# Patient Record
Sex: Male | Born: 1945 | Race: White | Hispanic: No | Marital: Married | Smoking: Former smoker
Health system: Southern US, Community
[De-identification: ages and names within clinical notes are randomized; demographics above are authoritative.]

## PROBLEM LIST (undated history)

## (undated) DIAGNOSIS — E785 Hyperlipidemia, unspecified: Secondary | ICD-10-CM

## (undated) DIAGNOSIS — G4730 Sleep apnea, unspecified: Secondary | ICD-10-CM

## (undated) DIAGNOSIS — A4902 Methicillin resistant Staphylococcus aureus infection, unspecified site: Secondary | ICD-10-CM

## (undated) DIAGNOSIS — F329 Major depressive disorder, single episode, unspecified: Secondary | ICD-10-CM

## (undated) DIAGNOSIS — F419 Anxiety disorder, unspecified: Secondary | ICD-10-CM

## (undated) DIAGNOSIS — I219 Acute myocardial infarction, unspecified: Secondary | ICD-10-CM

## (undated) DIAGNOSIS — E119 Type 2 diabetes mellitus without complications: Secondary | ICD-10-CM

## (undated) DIAGNOSIS — I10 Essential (primary) hypertension: Secondary | ICD-10-CM

## (undated) DIAGNOSIS — F32A Depression, unspecified: Secondary | ICD-10-CM

## (undated) DIAGNOSIS — K219 Gastro-esophageal reflux disease without esophagitis: Secondary | ICD-10-CM

## (undated) DIAGNOSIS — C689 Malignant neoplasm of urinary organ, unspecified: Secondary | ICD-10-CM

## (undated) HISTORY — PX: CORONARY ANGIOPLASTY WITH STENT PLACEMENT: SHX49

## (undated) HISTORY — DX: Hyperlipidemia, unspecified: E78.5

## (undated) HISTORY — DX: Depression, unspecified: F32.A

## (undated) HISTORY — DX: Essential (primary) hypertension: I10

## (undated) HISTORY — DX: Anxiety disorder, unspecified: F41.9

## (undated) HISTORY — PX: NASAL SINUS SURGERY: SHX719

## (undated) HISTORY — DX: Major depressive disorder, single episode, unspecified: F32.9

## (undated) HISTORY — PX: CATARACT EXTRACTION: SUR2

## (undated) HISTORY — DX: Sleep apnea, unspecified: G47.30

## (undated) HISTORY — PX: OTHER SURGICAL HISTORY: SHX169

## (undated) HISTORY — DX: Gastro-esophageal reflux disease without esophagitis: K21.9

## (undated) HISTORY — PX: EYE SURGERY: SHX253

## (undated) HISTORY — DX: Acute myocardial infarction, unspecified: I21.9

## (undated) HISTORY — DX: Type 2 diabetes mellitus without complications: E11.9

---
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
## 1990-07-10 DIAGNOSIS — A4902 Methicillin resistant Staphylococcus aureus infection, unspecified site: Secondary | ICD-10-CM

## 1990-07-10 HISTORY — DX: Methicillin resistant Staphylococcus aureus infection, unspecified site: A49.02

## 1993-07-10 DIAGNOSIS — I219 Acute myocardial infarction, unspecified: Secondary | ICD-10-CM

## 1993-07-10 HISTORY — DX: Acute myocardial infarction, unspecified: I21.9

## 2004-08-15 ENCOUNTER — Other Ambulatory Visit: Payer: Self-pay

## 2004-08-15 ENCOUNTER — Inpatient Hospital Stay: Payer: Self-pay

## 2004-08-16 ENCOUNTER — Other Ambulatory Visit: Payer: Self-pay

## 2006-11-06 ENCOUNTER — Emergency Department: Payer: Self-pay | Admitting: Emergency Medicine

## 2006-11-26 ENCOUNTER — Other Ambulatory Visit: Payer: Self-pay

## 2006-11-26 ENCOUNTER — Inpatient Hospital Stay: Payer: Self-pay | Admitting: Internal Medicine

## 2008-02-05 LAB — HM COLONOSCOPY

## 2008-03-25 ENCOUNTER — Other Ambulatory Visit: Payer: Self-pay

## 2008-03-25 ENCOUNTER — Observation Stay: Payer: Self-pay | Admitting: Internal Medicine

## 2008-07-10 HISTORY — PX: COLONOSCOPY: SHX174

## 2009-01-21 ENCOUNTER — Ambulatory Visit: Payer: Self-pay | Admitting: Family Medicine

## 2011-03-07 ENCOUNTER — Ambulatory Visit: Payer: Self-pay | Admitting: Ophthalmology

## 2011-03-20 ENCOUNTER — Ambulatory Visit: Payer: Self-pay | Admitting: Ophthalmology

## 2011-09-06 ENCOUNTER — Ambulatory Visit: Payer: Self-pay | Admitting: Family Medicine

## 2012-04-23 DIAGNOSIS — L0292 Furuncle, unspecified: Secondary | ICD-10-CM | POA: Insufficient documentation

## 2012-09-24 DIAGNOSIS — M5136 Other intervertebral disc degeneration, lumbar region: Secondary | ICD-10-CM | POA: Insufficient documentation

## 2012-09-24 DIAGNOSIS — M51369 Other intervertebral disc degeneration, lumbar region without mention of lumbar back pain or lower extremity pain: Secondary | ICD-10-CM | POA: Insufficient documentation

## 2012-10-02 DIAGNOSIS — I2510 Atherosclerotic heart disease of native coronary artery without angina pectoris: Secondary | ICD-10-CM | POA: Insufficient documentation

## 2013-01-09 ENCOUNTER — Encounter: Payer: Self-pay | Admitting: Internal Medicine

## 2013-02-03 ENCOUNTER — Emergency Department: Payer: Self-pay | Admitting: Emergency Medicine

## 2013-02-03 LAB — CBC
HCT: 40.1 % (ref 40.0–52.0)
MCH: 30.9 pg (ref 26.0–34.0)
MCHC: 34.7 g/dL (ref 32.0–36.0)
MCV: 89 fL (ref 80–100)
RBC: 4.5 10*6/uL (ref 4.40–5.90)
RDW: 13.8 % (ref 11.5–14.5)

## 2013-02-03 LAB — COMPREHENSIVE METABOLIC PANEL
Albumin: 3.3 g/dL — ABNORMAL LOW (ref 3.4–5.0)
Alkaline Phosphatase: 121 U/L (ref 50–136)
BUN: 11 mg/dL (ref 7–18)
Chloride: 106 mmol/L (ref 98–107)
Co2: 28 mmol/L (ref 21–32)
Creatinine: 0.69 mg/dL (ref 0.60–1.30)
EGFR (Non-African Amer.): >60
Osmolality: 277 (ref 275–301)
SGOT(AST): 26 U/L (ref 15–37)
SGPT (ALT): 30 U/L (ref 12–78)

## 2013-02-03 LAB — URINALYSIS, COMPLETE
Bacteria: NONE SEEN
Blood: NEGATIVE
Glucose,UR: NEGATIVE mg/dL (ref 0–75)
Ketone: NEGATIVE
Nitrite: NEGATIVE
Protein: NEGATIVE
Squamous Epithelial: <1
WBC UR: NONE SEEN /HPF (ref 0–5)

## 2013-02-07 ENCOUNTER — Encounter: Payer: Self-pay | Admitting: Internal Medicine

## 2013-02-24 ENCOUNTER — Ambulatory Visit: Payer: Self-pay | Admitting: Anesthesiology

## 2013-03-10 ENCOUNTER — Encounter: Payer: Self-pay | Admitting: Internal Medicine

## 2013-05-06 ENCOUNTER — Ambulatory Visit: Payer: Self-pay | Admitting: Anesthesiology

## 2013-05-29 ENCOUNTER — Ambulatory Visit: Payer: Self-pay | Admitting: Anesthesiology

## 2013-09-05 ENCOUNTER — Emergency Department: Payer: Self-pay | Admitting: Emergency Medicine

## 2013-09-05 DIAGNOSIS — IMO0002 Reserved for concepts with insufficient information to code with codable children: Secondary | ICD-10-CM | POA: Diagnosis not present

## 2013-09-30 LAB — LIPID PANEL
CHOLESTEROL: 147 mg/dL (ref 0–200)
HDL: 56 mg/dL (ref 35–70)
LDL Cholesterol: 74 mg/dL
LDl/HDL Ratio: 1.3
Triglycerides: 86 mg/dL (ref 40–160)

## 2013-09-30 LAB — HEPATIC FUNCTION PANEL
ALT: 17 U/L (ref 10–40)
AST: 20 U/L (ref 14–40)
Alkaline Phosphatase: 109 U/L (ref 25–125)
BILIRUBIN, TOTAL: 1.8 mg/dL

## 2013-09-30 LAB — CBC AND DIFFERENTIAL
HCT: 45 % (ref 41–53)
Hemoglobin: 15.3 g/dL (ref 13.5–17.5)
NEUTROS ABS: 5 /uL
PLATELETS: 211 10*3/uL (ref 150–399)
WBC: 8.1 10^3/mL

## 2013-09-30 LAB — TSH: TSH: 1.83 u[IU]/mL (ref 0.41–5.90)

## 2013-09-30 LAB — BASIC METABOLIC PANEL
BUN: 14 mg/dL (ref 4–21)
CREATININE: 0.9 mg/dL (ref 0.6–1.3)
Glucose: 127 mg/dL
Potassium: 4.4 mmol/L (ref 3.4–5.3)
SODIUM: 142 mmol/L (ref 137–147)

## 2013-09-30 LAB — PSA: PSA: 0.5

## 2014-04-13 ENCOUNTER — Ambulatory Visit: Payer: Self-pay | Admitting: Family Medicine

## 2014-04-13 DIAGNOSIS — S82402A Unspecified fracture of shaft of left fibula, initial encounter for closed fracture: Secondary | ICD-10-CM | POA: Diagnosis not present

## 2014-09-11 ENCOUNTER — Emergency Department: Payer: Self-pay | Admitting: Emergency Medicine

## 2014-09-11 DIAGNOSIS — S0990XA Unspecified injury of head, initial encounter: Secondary | ICD-10-CM | POA: Diagnosis not present

## 2014-09-11 DIAGNOSIS — R42 Dizziness and giddiness: Secondary | ICD-10-CM | POA: Diagnosis not present

## 2014-09-23 LAB — HEMOGLOBIN A1C: HEMOGLOBIN A1C: 7.7 % — AB (ref 4.0–6.0)

## 2014-10-30 NOTE — H&P (Signed)
PATIENT NAME:  Patrick Mcgee, Patrick Mcgee MR#:  [id number] DATE OF BIRTH:  1945/08/22  DATE OF ADMISSION:  02/24/2013  CHIEF COMPLAINT:  Low back pain with right and left posterior lateral leg pain.  PROCEDURE:  None.  HISTORY OF PRESENT ILLNESS:  [place] is a pleasant 69 year old white male with long-standing history of low back pain for over 20 years.  In the past, he has been seen at [facility] at the pain center there were he has had previous epidural steroid injections giving him significant relief in his low back pain. He has had gradual increase in low back pain over the past several months with the right side generally causing more pain than the left with calf pain. His primary complaint is low back pain with posterior lateral calf pain and occasional give-way weakness. The pain is described as sharp, stabbing, throbbing pain with a maximum VAS of about 4. The pain is worse with activity, aggravated by bending and twisting motions, occasionally alleviated by hot packs and lying down. As mentioned, previous epidural steroids did help him. He has had a previous MRI, which is unavailable to me at this time.  PAST MEDICAL HISTORY:  Significant for high blood pressure, previous heart attack and heart surgery. Currently on blood thinners such as Plavix. He also has a history of sleep apnea on CPAP with history of diabetes.  Negative review of systems for other neurologic problems, psychologic problems GI or GU problems or hematologic problems.   SOCIAL HISTORY: Positive for being married, one child, never smoked, works full-time.  PAST SURGICAL HISTORY:  Include previous heart surgery, stent x 5.   FAMILY HISTORY:  Positive for diabetes and high blood pressure.  CURRENT MEDICATIONS: Include Plavix 75 mg, atorvastatin, losartan, Januvia, NovoLog 70/30 mix 30 units, niacin, carvedilol, levothyroxine, aspirin, fluocinonide,   vitamin B12 and Symbicort.  ALLERGIES:  PENICILLIN AND SULFA.  PHYSICAL  EXAMINATION: VITAL SIGNS:  Reveals a VAS of 3/10, temperature 96.7, blood pressure 140/86, pulse 65, respirations 18. HEART:  Regular rate and rhythm without murmur. LUNGS:  Clear to auscultation bilaterally. LOW BACK:  Inspection reveals some paraspinous muscle tenderness, but no overt trigger points.  He has good strength throughout the lower extremities, questionable straight leg sign at 45 degrees on the right, negative on the left.  Good muscle tone and bulk.  ASSESSMENT: 1.  Degenerative disk disease with radicular symptoms, bilateral L5 distribution, L5 or S1 distribution, right greater than left. 2.  Facet arthropathy. 3.  Paraspinous muscle tenderness. 4.  History of coronary disease, status post myocardial infarction and stent placement on Plavix.  PLAN: 1.  We are going to get clearance to discontinue his Plavix before an epidural series is initiated. 2. MRI report is being requested from Dr. Harlow Asa at [facility]. He is to return to clinic in approximately 2 weeks after we can confirm that we can discontinue his Plavix for this procedure.   ____________________________ Alvina Filbert. Andree Elk, MD jga:ce D: 03/06/2013 10:12:41 ET T: 03/06/2013 10:26:15 ET JOB#: [id number]  cc: Alvina Filbert. Andree Elk, MD, <Dictator> Richard L. Rosanna Randy, MD  Alvina Filbert Jesper Stirewalt MD ELECTRONICALLY SIGNED 03/12/2013 7:44

## 2014-11-12 ENCOUNTER — Encounter: Payer: Self-pay | Admitting: Emergency Medicine

## 2014-11-12 DIAGNOSIS — E785 Hyperlipidemia, unspecified: Secondary | ICD-10-CM | POA: Insufficient documentation

## 2014-11-12 DIAGNOSIS — E039 Hypothyroidism, unspecified: Secondary | ICD-10-CM | POA: Insufficient documentation

## 2014-11-12 DIAGNOSIS — K130 Diseases of lips: Secondary | ICD-10-CM | POA: Insufficient documentation

## 2014-11-12 DIAGNOSIS — E669 Obesity, unspecified: Secondary | ICD-10-CM | POA: Insufficient documentation

## 2014-11-12 DIAGNOSIS — G4730 Sleep apnea, unspecified: Secondary | ICD-10-CM | POA: Insufficient documentation

## 2014-11-12 DIAGNOSIS — K219 Gastro-esophageal reflux disease without esophagitis: Secondary | ICD-10-CM | POA: Insufficient documentation

## 2014-11-12 DIAGNOSIS — M9979 Connective tissue and disc stenosis of intervertebral foramina of abdomen and other regions: Secondary | ICD-10-CM | POA: Insufficient documentation

## 2014-11-12 DIAGNOSIS — E119 Type 2 diabetes mellitus without complications: Secondary | ICD-10-CM | POA: Insufficient documentation

## 2014-11-12 DIAGNOSIS — I2510 Atherosclerotic heart disease of native coronary artery without angina pectoris: Secondary | ICD-10-CM | POA: Insufficient documentation

## 2014-11-12 DIAGNOSIS — J309 Allergic rhinitis, unspecified: Secondary | ICD-10-CM | POA: Insufficient documentation

## 2014-11-12 DIAGNOSIS — I10 Essential (primary) hypertension: Secondary | ICD-10-CM | POA: Insufficient documentation

## 2014-11-12 DIAGNOSIS — J45909 Unspecified asthma, uncomplicated: Secondary | ICD-10-CM | POA: Insufficient documentation

## 2014-11-12 DIAGNOSIS — J342 Deviated nasal septum: Secondary | ICD-10-CM | POA: Insufficient documentation

## 2014-12-22 ENCOUNTER — Ambulatory Visit (INDEPENDENT_AMBULATORY_CARE_PROVIDER_SITE_OTHER): Payer: 59 | Admitting: Family Medicine

## 2014-12-22 ENCOUNTER — Encounter: Payer: Self-pay | Admitting: Family Medicine

## 2014-12-22 VITALS — BP 138/78 | HR 76 | Temp 97.8°F | Resp 16 | Ht 68.0 in | Wt 220.0 lb

## 2014-12-22 DIAGNOSIS — I10 Essential (primary) hypertension: Secondary | ICD-10-CM | POA: Diagnosis not present

## 2014-12-22 DIAGNOSIS — E118 Type 2 diabetes mellitus with unspecified complications: Secondary | ICD-10-CM

## 2014-12-22 DIAGNOSIS — E785 Hyperlipidemia, unspecified: Secondary | ICD-10-CM | POA: Diagnosis not present

## 2014-12-22 NOTE — Progress Notes (Signed)
Patient ID: Patrick Mcgee, male   DOB: [birth year], 69 y.o.   MRN: [id number]   Patrick Mcgee  MRN: [id number] DOB: [birth year]  Subjective:  HPI   1. Type 2 diabetes mellitus with complication  - HgB K4Y  2. Essential hypertension  - CBC With Differential/Platelet - CMP14+EGFR - TSH  3. Hyperlipidemia  -4. Obesity Patient losing weight with Nutrisystem Lipid Panel With LDL/HDL Ratio   Patient Active Problem List   Diagnosis Date Noted  . Allergic rhinitis 11/12/2014  . Airway hyperreactivity 11/12/2014  . Atherosclerosis of coronary artery 11/12/2014  . Cheilitis 11/12/2014  . Narrowing of intervertebral disc space 11/12/2014  . Deflected nasal septum 11/12/2014  . Essential (primary) hypertension 11/12/2014  . Esophageal reflux 11/12/2014  . HLD (hyperlipidemia) 11/12/2014  . BP (high blood pressure) 11/12/2014  . Adult hypothyroidism 11/12/2014  . Adiposity 11/12/2014  . Apnea, sleep 11/12/2014  . Diabetes mellitus, type 2 11/12/2014    History reviewed. No pertinent past medical history.  History   Social History  . Marital Status: Married    Spouse Name: N/A  . Number of Children: N/A  . Years of Education: N/A   Occupational History  . Not on file.   Social History Main Topics  . Smoking status: Former Smoker    Types: Cigars  . Smokeless tobacco: Not on file  . Alcohol Use: 0.6 oz/week    1 Standard drinks or equivalent per week  . Drug Use: No  . Sexual Activity: Not on file   Other Topics Concern  . Not on file   Social History Narrative    Outpatient Prescriptions Prior to Visit  Medication Sig Dispense Refill  . aspirin 81 MG tablet Take by mouth.    Marland Kitchen atorvastatin (LIPITOR) 40 MG tablet Take by mouth.    . budesonide-formoterol (SYMBICORT) 80-4.5 MCG/ACT inhaler Inhale into the lungs.    . canagliflozin (INVOKANA) 300 MG TABS tablet Take by mouth.    . carvedilol (COREG) 12.5 MG tablet Take by mouth.    . clopidogrel (PLAVIX) 75  MG tablet Take by mouth.    . Glucose Blood DISK BAYER BREEZE 2 TEST (In Vitro Disk)  1 Disk check sugar twice daily for 0 days  Quantity: 3;  Refills: 3   Ordered :19-May-2014  [name] MD;  Started 19-May-2014 Active Comments: DX: E11.9-QS 90 day supply-    . insulin aspart protamine - aspart (NOVOLOG 70/30 MIX) (70-30) 100 UNIT/ML FlexPen Inject into the skin.    Marland Kitchen levothyroxine (SYNTHROID, LEVOTHROID) 75 MCG tablet Take by mouth.    . losartan (COZAAR) 100 MG tablet Take by mouth.    . meclizine (ANTIVERT) 25 MG tablet Take by mouth.    . niacin (NIASPAN) 500 MG CR tablet Take by mouth.    . nystatin cream (MYCOSTATIN) NYSTATIN, 100000 UNIT/GM (External Cream) - Historical Medication  apply to affected area daily, as needed (100000 UNIT/GM) Active Comments: Medication taken as needed.    . sitaGLIPtin (JANUVIA) 100 MG tablet Take by mouth.    . budesonide-formoterol (SYMBICORT) 80-4.5 MCG/ACT inhaler Inhale into the lungs.    . clopidogrel (PLAVIX) 75 MG tablet Take by mouth.    . fluocinonide cream (LIDEX) 0.05 % FLUOCINONIDE, 0.05% (External Cream) - Historical Medication  apply to affected area daily (0.05 %) Active Comments: Per Dr. Evorn Gong    . levothyroxine (SYNTHROID, LEVOTHROID) 75 MCG tablet Take by mouth.    . mometasone (NASONEX) 50 MCG/ACT nasal spray Place  into the nose.     No facility-administered medications prior to visit.    Allergies  Allergen Reactions  . Ace Inhibitors     Other reaction(s): Unknown  . Sulfa Antibiotics Other (See Comments)  . Penicillins Rash    Review of Systems  Constitutional: Negative.   HENT: Negative.   Eyes: Negative.   Respiratory: Negative.   Cardiovascular: Negative.   Gastrointestinal: Negative.   Genitourinary: Negative.   Skin: Negative.   Neurological: Negative.   All other systems reviewed and are negative.  Objective:  BP 138/78 mmHg  Pulse 76  Temp(Src) 97.8 F (36.6 C) (Oral)  Resp 16  Ht $R'5\' 8"'Xv$  (1.727 m)   Wt 220 lb (99.791 kg)  BMI 33.46 kg/m2  Physical Exam  Constitutional: He is oriented to person, place, and time and well-developed, well-nourished, and in no distress.  HENT:  Head: Normocephalic and atraumatic.  Right Ear: External ear normal.  Left Ear: External ear normal.  Mouth/Throat: Oropharynx is clear and moist.  Eyes: Conjunctivae and EOM are normal. Pupils are equal, round, and reactive to light.  Neck: Normal range of motion. Neck supple.  Cardiovascular: Normal rate, regular rhythm and normal heart sounds.   Pulmonary/Chest: Effort normal and breath sounds normal.  Abdominal: Soft. Bowel sounds are normal.  Musculoskeletal: Normal range of motion.  Neurological: He is alert and oriented to person, place, and time. Gait normal.  Skin: Skin is warm and dry.  Psychiatric: Mood, memory, affect and judgment normal.    Assessment and Plan :  Type 2 diabetes mellitus with complication - Plan: HgB A1c  Essential hypertension - Plan: CBC With Differential/Platelet, CMP14+EGFR, TSH  Hyperlipidemia - Plan: Lipid Panel With LDL/HDL Ratio  Obesity. Patient now successfully losing weight with habit changes. 20 pounds lost [facility] 12/22/2014 4:30 PM

## 2015-01-28 ENCOUNTER — Ambulatory Visit (INDEPENDENT_AMBULATORY_CARE_PROVIDER_SITE_OTHER): Payer: Medicare Other

## 2015-01-28 ENCOUNTER — Ambulatory Visit (INDEPENDENT_AMBULATORY_CARE_PROVIDER_SITE_OTHER): Payer: Medicare Other | Admitting: Podiatry

## 2015-01-28 VITALS — BP 128/70 | HR 60 | Resp 16

## 2015-01-28 DIAGNOSIS — M722 Plantar fascial fibromatosis: Secondary | ICD-10-CM

## 2015-01-28 NOTE — Patient Instructions (Signed)
Plantar Fasciitis (Heel Spur Syndrome) with Rehab The plantar fascia is a fibrous, ligament-like, soft-tissue structure that spans the bottom of the foot. Plantar fasciitis is a condition that causes pain in the foot due to inflammation of the tissue. SYMPTOMS   Pain and tenderness on the underneath side of the foot.  Pain that worsens with standing or walking. CAUSES  Plantar fasciitis is caused by irritation and injury to the plantar fascia on the underneath side of the foot. Common mechanisms of injury include:  Direct trauma to bottom of the foot.  Damage to a small nerve that runs under the foot where the main fascia attaches to the heel bone.  Stress placed on the plantar fascia due to bone spurs. RISK INCREASES WITH:   Activities that place stress on the plantar fascia (running, jumping, pivoting, or cutting).  Poor strength and flexibility.  Improperly fitted shoes.  Tight calf muscles.  Flat feet.  Failure to warm-up properly before activity.  Obesity. PREVENTION  Warm up and stretch properly before activity.  Allow for adequate recovery between workouts.  Maintain physical fitness:  Strength, flexibility, and endurance.  Cardiovascular fitness.  Maintain a health body weight.  Avoid stress on the plantar fascia.  Wear properly fitted shoes, including arch supports for individuals who have flat feet.  PROGNOSIS  If treated properly, then the symptoms of plantar fasciitis usually resolve without surgery. However, occasionally surgery is necessary.  RELATED COMPLICATIONS   Recurrent symptoms that may result in a chronic condition.  Problems of the lower back that are caused by compensating for the injury, such as limping.  Pain or weakness of the foot during push-off following surgery.  Chronic inflammation, scarring, and partial or complete fascia tear, occurring more often from repeated injections.  TREATMENT  Treatment initially involves the  use of ice and medication to help reduce pain and inflammation. The use of strengthening and stretching exercises may help reduce pain with activity, especially stretches of the Achilles tendon. These exercises may be performed at home or with a therapist. Your caregiver may recommend that you use heel cups of arch supports to help reduce stress on the plantar fascia. Occasionally, corticosteroid injections are given to reduce inflammation. If symptoms persist for greater than 6 months despite non-surgical (conservative), then surgery may be recommended.   MEDICATION   If pain medication is necessary, then nonsteroidal anti-inflammatory medications, such as aspirin and ibuprofen, or other minor pain relievers, such as acetaminophen, are often recommended.  Do not take pain medication within 7 days before surgery.  Prescription pain relievers may be given if deemed necessary by your caregiver. Use only as directed and only as much as you need.  Corticosteroid injections may be given by your caregiver. These injections should be reserved for the most serious cases, because they may only be given a certain number of times.  HEAT AND COLD  Cold treatment (icing) relieves pain and reduces inflammation. Cold treatment should be applied for 10 to 15 minutes every 2 to 3 hours for inflammation and pain and immediately after any activity that aggravates your symptoms. Use ice packs or massage the area with a piece of ice (ice massage).  Heat treatment may be used prior to performing the stretching and strengthening activities prescribed by your caregiver, physical therapist, or athletic trainer. Use a heat pack or soak the injury in warm water.  SEEK IMMEDIATE MEDICAL CARE IF:  Treatment seems to offer no benefit, or the condition worsens.  Any medications   produce adverse side effects.  EXERCISES- RANGE OF MOTION (ROM) AND STRETCHING EXERCISES - Plantar Fasciitis (Heel Spur Syndrome) These exercises  may help you when beginning to rehabilitate your injury. Your symptoms may resolve with or without further involvement from your physician, physical therapist or athletic trainer. While completing these exercises, remember:   Restoring tissue flexibility helps normal motion to return to the joints. This allows healthier, less painful movement and activity.  An effective stretch should be held for at least 30 seconds.  A stretch should never be painful. You should only feel a gentle lengthening or release in the stretched tissue.  RANGE OF MOTION - Toe Extension, Flexion  Sit with your right / left leg crossed over your opposite knee.  Grasp your toes and gently pull them back toward the top of your foot. You should feel a stretch on the bottom of your toes and/or foot.  Hold this stretch for 10 seconds.  Now, gently pull your toes toward the bottom of your foot. You should feel a stretch on the top of your toes and or foot.  Hold this stretch for 10 seconds. Repeat  times. Complete this stretch 3 times per day.   RANGE OF MOTION - Ankle Dorsiflexion, Active Assisted  Remove shoes and sit on a chair that is preferably not on a carpeted surface.  Place right / left foot under knee. Extend your opposite leg for support.  Keeping your heel down, slide your right / left foot back toward the chair until you feel a stretch at your ankle or calf. If you do not feel a stretch, slide your bottom forward to the edge of the chair, while still keeping your heel down.  Hold this stretch for 10 seconds. Repeat 3 times. Complete this stretch 2 times per day.   STRETCH  Gastroc, Standing  Place hands on wall.  Extend right / left leg, keeping the front knee somewhat bent.  Slightly point your toes inward on your back foot.  Keeping your right / left heel on the floor and your knee straight, shift your weight toward the wall, not allowing your back to arch.  You should feel a gentle stretch  in the right / left calf. Hold this position for 10 seconds. Repeat 3 times. Complete this stretch 2 times per day.  STRETCH  Soleus, Standing  Place hands on wall.  Extend right / left leg, keeping the other knee somewhat bent.  Slightly point your toes inward on your back foot.  Keep your right / left heel on the floor, bend your back knee, and slightly shift your weight over the back leg so that you feel a gentle stretch deep in your back calf.  Hold this position for 10 seconds. Repeat 3 times. Complete this stretch 2 times per day.  STRETCH  Gastrocsoleus, Standing  Note: This exercise can place a lot of stress on your foot and ankle. Please complete this exercise only if specifically instructed by your caregiver.   Place the ball of your right / left foot on a step, keeping your other foot firmly on the same step.  Hold on to the wall or a rail for balance.  Slowly lift your other foot, allowing your body weight to press your heel down over the edge of the step.  You should feel a stretch in your right / left calf.  Hold this position for 10 seconds.  Repeat this exercise with a slight bend in your right /   left knee. Repeat 3 times. Complete this stretch 2 times per day.   STRENGTHENING EXERCISES - Plantar Fasciitis (Heel Spur Syndrome)  These exercises may help you when beginning to rehabilitate your injury. They may resolve your symptoms with or without further involvement from your physician, physical therapist or athletic trainer. While completing these exercises, remember:   Muscles can gain both the endurance and the strength needed for everyday activities through controlled exercises.  Complete these exercises as instructed by your physician, physical therapist or athletic trainer. Progress the resistance and repetitions only as guided.  STRENGTH - Towel Curls  Sit in a chair positioned on a non-carpeted surface.  Place your foot on a towel, keeping your heel  on the floor.  Pull the towel toward your heel by only curling your toes. Keep your heel on the floor. Repeat 3 times. Complete this exercise 2 times per day.  STRENGTH - Ankle Inversion  Secure one end of a rubber exercise band/tubing to a fixed object (table, pole). Loop the other end around your foot just before your toes.  Place your fists between your knees. This will focus your strengthening at your ankle.  Slowly, pull your big toe up and in, making sure the band/tubing is positioned to resist the entire motion.  Hold this position for 10 seconds.  Have your muscles resist the band/tubing as it slowly pulls your foot back to the starting position. Repeat 3 times. Complete this exercises 2 times per day.  Document Released: 06/26/2005 Document Revised: 09/18/2011 Document Reviewed: 10/08/2008 [facility] Patient Information 2014 [place], [place]. Diabetes and Foot Care Diabetes may cause you to have problems because of poor blood supply (circulation) to your feet and legs. This may cause the skin on your feet to become thinner, break easier, and heal more slowly. Your skin may become dry, and the skin may peel and crack. You may also have nerve damage in your legs and feet causing decreased feeling in them. You may not notice minor injuries to your feet that could lead to infections or more serious problems. Taking care of your feet is one of the most important things you can do for yourself.  HOME CARE INSTRUCTIONS  Wear shoes at all times, even in the house. Do not go barefoot. Bare feet are easily injured.  Check your feet daily for blisters, cuts, and redness. If you cannot see the bottom of your feet, use a mirror or ask someone for help.  Wash your feet with warm water (do not use hot water) and mild soap. Then pat your feet and the areas between your toes until they are completely dry. Do not soak your feet as this can dry your skin.  Apply a moisturizing lotion or petroleum  jelly (that does not contain alcohol and is unscented) to the skin on your feet and to dry, brittle toenails. Do not apply lotion between your toes.  Trim your toenails straight across. Do not dig under them or around the cuticle. File the edges of your nails with an emery board or nail file.  Do not cut corns or calluses or try to remove them with medicine.  Wear clean socks or stockings every day. Make sure they are not too tight. Do not wear knee-high stockings since they may decrease blood flow to your legs.  Wear shoes that fit properly and have enough cushioning. To break in new shoes, wear them for just a few hours a day. This prevents you from injuring your  feet. Always look in your shoes before you put them on to be sure there are no objects inside.  Do not cross your legs. This may decrease the blood flow to your feet.  If you find a minor scrape, cut, or break in the skin on your feet, keep it and the skin around it clean and dry. These areas may be cleansed with mild soap and water. Do not cleanse the area with peroxide, alcohol, or iodine.  When you remove an adhesive bandage, be sure not to damage the skin around it.  If you have a wound, look at it several times a day to make sure it is healing.  Do not use heating pads or hot water bottles. They may burn your skin. If you have lost feeling in your feet or legs, you may not know it is happening until it is too late.  Make sure your health care provider performs a complete foot exam at least annually or more often if you have foot problems. Report any cuts, sores, or bruises to your health care provider immediately. SEEK MEDICAL CARE IF:   You have an injury that is not healing.  You have cuts or breaks in the skin.  You have an ingrown nail.  You notice redness on your legs or feet.  You feel burning or tingling in your legs or feet.  You have pain or cramps in your legs and feet.  Your legs or feet are numb.  Your  feet always feel cold. SEEK IMMEDIATE MEDICAL CARE IF:   There is increasing redness, swelling, or pain in or around a wound.  There is a red line that goes up your leg.  Pus is coming from a wound.  You develop a fever or as directed by your health care provider.  You notice a bad smell coming from an ulcer or wound. Document Released: 06/23/2000 Document Revised: 02/26/2013 Document Reviewed: 12/03/2012 [facility] Patient Information 2015 [name], [place]. This information is not intended to replace advice given to you by your health care provider. Make sure you discuss any questions you have with your health care provider.

## 2015-01-29 ENCOUNTER — Encounter: Payer: Self-pay | Admitting: Podiatry

## 2015-01-29 NOTE — Progress Notes (Signed)
Subjective:     Patient ID: Patrick Mcgee, male   DOB: 1946-01-24, 69 y.o.   MRN: [id number]  HPI Patient presents to the office with complaints of right heel pain, which is worse in the morning and relieved by ambulation. Once he gets going the pain subsides. This has been ongoing for greater than 1 month. Denies any history of injury or trauma. He started walking more about 1 month ago which seemed to make it worse, although the pain was present prior to this. It does not wake him up at night. No previous treatment. No other complaints at this time.    Review of Systems  All other systems reviewed and are negative.      Objective:   Physical Exam AAO x3, NAD DP/PT pulses palpable bilaterally, CRT less than 3 seconds Protective sensation intact with Simms Weinstein monofilament, vibratory sensation intact, Achilles tendon reflex intact Tenderness to palpation overlying the plantar medial tubercle of the calcaneus to the right heel at the insertion of the plantar fascia. There is no pain along the course of plantar fascia within the arch of the foot. There is no pain with lateral compression of the calcaneus or pain the vibratory sensation. No pain on the posterior aspect of the calcaneus or along the course/insertion of the Achilles tendon. There is no overlying edema, erythema, increase in warmth. No other areas of tenderness palpation or pain with vibratory sensation to the foot/ankle. MMT 5/5, ROM WNL No open lesions or pre-ulcerative lesions are identified. No pain with calf compression, swelling, warmth, erythema.     Assessment:     Heel pain; plantar fasciitis     Plan:     -X-rays were obtained and reviewed with the patient.  -Treatment options discussed including all alternatives, risks, and complications. Discussed etiology -Discussed steroid injection; he wishes to hold off -Dispensed plantar fascial brace -Stretching exercises daily -Ice daily -Shoegear modifications;  not to go barefoot -Discussed orthotics  -Follow-up 3-4 weeks or sooner if any problems arise. In the meantime, encouraged to call the office with any questions, concerns, change in symptoms.    Celesta Gentile, DPM

## 2015-02-09 ENCOUNTER — Other Ambulatory Visit: Payer: Self-pay | Admitting: Emergency Medicine

## 2015-02-09 DIAGNOSIS — I25119 Atherosclerotic heart disease of native coronary artery with unspecified angina pectoris: Secondary | ICD-10-CM

## 2015-02-09 MED ORDER — CLOPIDOGREL BISULFATE 75 MG PO TABS: 75.0000 mg | ORAL_TABLET | Freq: Every day | ORAL | Status: DC

## 2015-02-09 NOTE — Progress Notes (Signed)
Per Rx request from web site. Pt requested refill on Clopidogrel. Dr, Rosanna Randy ok for 1 year.

## 2015-02-10 DIAGNOSIS — I2510 Atherosclerotic heart disease of native coronary artery without angina pectoris: Secondary | ICD-10-CM | POA: Diagnosis not present

## 2015-02-10 DIAGNOSIS — E119 Type 2 diabetes mellitus without complications: Secondary | ICD-10-CM | POA: Diagnosis not present

## 2015-02-10 DIAGNOSIS — I10 Essential (primary) hypertension: Secondary | ICD-10-CM | POA: Diagnosis not present

## 2015-02-19 ENCOUNTER — Other Ambulatory Visit: Payer: Self-pay | Admitting: Family Medicine

## 2015-02-19 NOTE — Telephone Encounter (Signed)
Pt would like all new printed RX expect for clopidogrel (PLAVIX) 75 MG tablet & niacin (NIASPAN) 500 MG CR tablet because he has new insurance. Thanks TNP

## 2015-02-22 MED ORDER — CARVEDILOL 12.5 MG PO TABS: 12.5000 mg | ORAL_TABLET | Freq: Two times a day (BID) | ORAL | Status: DC

## 2015-02-22 MED ORDER — SITAGLIPTIN PHOSPHATE 100 MG PO TABS: 100.0000 mg | ORAL_TABLET | Freq: Every day | ORAL | Status: DC

## 2015-02-22 MED ORDER — INSULIN ASPART PROT & ASPART (70-30 MIX) 100 UNIT/ML PEN: 20.0000 [IU] | PEN_INJECTOR | Freq: Two times a day (BID) | SUBCUTANEOUS | Status: DC

## 2015-02-22 MED ORDER — ATORVASTATIN CALCIUM 40 MG PO TABS: 40.0000 mg | ORAL_TABLET | Freq: Every day | ORAL | Status: DC

## 2015-02-22 MED ORDER — LEVOTHYROXINE SODIUM 75 MCG PO TABS: 75.0000 ug | ORAL_TABLET | Freq: Every day | ORAL | Status: DC

## 2015-02-22 MED ORDER — CANAGLIFLOZIN 300 MG PO TABS: 300.0000 mg | ORAL_TABLET | Freq: Every day | ORAL | Status: DC

## 2015-02-22 MED ORDER — BUDESONIDE-FORMOTEROL FUMARATE 80-4.5 MCG/ACT IN AERO: 2.0000 | INHALATION_SPRAY | Freq: Every day | RESPIRATORY_TRACT | Status: DC | PRN

## 2015-02-22 MED ORDER — LOSARTAN POTASSIUM 100 MG PO TABS: 100.0000 mg | ORAL_TABLET | Freq: Every day | ORAL | Status: DC

## 2015-02-22 NOTE — Telephone Encounter (Signed)
LMTCB-need to know if he needs 90 days supply or 30 day-aa

## 2015-02-22 NOTE — Telephone Encounter (Signed)
Pt states he needs a 90 day supply.  CB#[phone]/MW

## 2015-02-22 NOTE — Telephone Encounter (Signed)
Done, RXs placed up front, pt advised-aa

## 2015-02-25 ENCOUNTER — Ambulatory Visit: Payer: Medicare Other | Admitting: Podiatry

## 2015-03-02 DIAGNOSIS — L309 Dermatitis, unspecified: Secondary | ICD-10-CM | POA: Diagnosis not present

## 2015-03-02 DIAGNOSIS — L718 Other rosacea: Secondary | ICD-10-CM | POA: Diagnosis not present

## 2015-03-29 ENCOUNTER — Encounter: Payer: Self-pay | Admitting: Family Medicine

## 2015-03-29 ENCOUNTER — Ambulatory Visit (INDEPENDENT_AMBULATORY_CARE_PROVIDER_SITE_OTHER): Payer: Medicare Other | Admitting: Family Medicine

## 2015-03-29 VITALS — BP 110/62 | HR 80 | Temp 97.7°F | Resp 12 | Wt 203.0 lb

## 2015-03-29 DIAGNOSIS — I10 Essential (primary) hypertension: Secondary | ICD-10-CM

## 2015-03-29 DIAGNOSIS — E119 Type 2 diabetes mellitus without complications: Secondary | ICD-10-CM | POA: Diagnosis not present

## 2015-03-29 DIAGNOSIS — I25119 Atherosclerotic heart disease of native coronary artery with unspecified angina pectoris: Secondary | ICD-10-CM

## 2015-03-29 DIAGNOSIS — K219 Gastro-esophageal reflux disease without esophagitis: Secondary | ICD-10-CM | POA: Diagnosis not present

## 2015-03-29 DIAGNOSIS — E785 Hyperlipidemia, unspecified: Secondary | ICD-10-CM | POA: Diagnosis not present

## 2015-03-29 MED ORDER — PIOGLITAZONE HCL 15 MG PO TABS: 15.0000 mg | ORAL_TABLET | Freq: Every day | ORAL | Status: DC

## 2015-03-29 NOTE — Progress Notes (Signed)
Patient ID: Patrick Mcgee, male   DOB: May 28, 1946, 69 y.o.   MRN: [id number]    Subjective:  HPI   Diabetes Mellitus Type II, Follow-up:   Lab Results  Component Value Date   HGBA1C 7.7* 09/23/2014    Last seen for diabetes 3 months ago.  Management since then includes none. He reports good compliance with treatment. His diabeteic medications have become more expensive on Medicare and wants to discuss getting this situated maybe come off some medication due to his weight loss. He is not having side effects.   Episodes of hypoglycemia? no   Current Insulin Regimen: yes Novolog at times. He has lost weight so he does not use insulin all the time. Weight trend: stable   Pertinent Labs:    Component Value Date/Time   CHOL 147 09/30/2013   TRIG 86 09/30/2013   CREATININE 0.9 09/30/2013   CREATININE 0.69 02/03/2013 2223    Wt Readings from Last 3 Encounters:  03/29/15 203 lb (92.08 kg)  12/22/14 220 lb (99.791 kg)  11/12/14 238 lb (107.956 kg)    ------------------------------------------------------------------------    Hypertension, follow-up:  BP Readings from Last 3 Encounters:  03/29/15 110/62  01/28/15 128/70  12/22/14 138/78    He was last seen for hypertension 3 months ago.  BP at that visit was 138/78. Management since that visit includes none. He reports good compliance with treatment. He is not having side effects.    Weight trend: decreasing steadily Wt Readings from Last 3 Encounters:  03/29/15 203 lb (92.08 kg)  12/22/14 220 lb (99.791 kg)  11/12/14 238 lb (107.956 kg)      ------------------------------------------------------------------------     Prior to Admission medications   Medication Sig Start Date End Date Taking? Authorizing Provider  aspirin 81 MG tablet Take by mouth. 04/03/13  Yes Historical Provider, MD  atorvastatin (LIPITOR) 40 MG tablet Take 1 tablet (40 mg total) by mouth daily. 02/22/15  Yes Richard Maceo Pro.,  MD  budesonide-formoterol [facility]) 80-4.5 MCG/ACT inhaler Inhale 2 puffs into the lungs daily as needed. 02/22/15  Yes Richard Maceo Pro., MD  canagliflozin (INVOKANA) 300 MG TABS tablet Take 300 mg by mouth daily. 02/22/15  Yes Richard Maceo Pro., MD  carvedilol (COREG) 12.5 MG tablet Take 1 tablet (12.5 mg total) by mouth 2 (two) times daily. 02/22/15  Yes Richard Maceo Pro., MD  clopidogrel (PLAVIX) 75 MG tablet Take 1 tablet (75 mg total) by mouth daily. 02/09/15  Yes Richard Maceo Pro., MD  Glucose Blood DISK BAYER BREEZE 2 TEST (In Vitro Disk)  1 Disk check sugar twice daily for 0 days  Quantity: 3;  Refills: 3   Ordered :19-May-2014  [name] MD;  Started 19-May-2014 Active Comments: DX: E11.9-QS 90 day supply- 05/19/14  Yes Historical Provider, MD  insulin aspart protamine - aspart (NOVOLOG 70/30 MIX) (70-30) 100 UNIT/ML FlexPen Inject 0.2 mLs (20 Units total) into the skin 2 (two) times daily. 02/22/15  Yes Richard Maceo Pro., MD  levothyroxine (SYNTHROID, LEVOTHROID) 75 MCG tablet Take 1 tablet (75 mcg total) by mouth daily. 02/22/15  Yes Richard Maceo Pro., MD  losartan (COZAAR) 100 MG tablet Take 1 tablet (100 mg total) by mouth daily. 02/22/15  Yes Richard Maceo Pro., MD  meclizine (ANTIVERT) 25 MG tablet Take by mouth.   Yes Historical Provider, MD  niacin (NIASPAN) 500 MG CR tablet Take by mouth. 07/06/14  Yes Historical Provider, MD  nystatin cream (  MYCOSTATIN) NYSTATIN, 100000 UNIT/GM (External Cream) - Historical Medication  apply to affected area daily, as needed (100000 UNIT/GM) Active Comments: Medication taken as needed.   Yes Historical Provider, MD  nystatin-triamcinolone (MYCOLOG II) cream Apply topically See admin instructions. Use as directed 10/07/09  Yes Historical Provider, MD  sitaGLIPtin (JANUVIA) 100 MG tablet Take 1 tablet (100 mg total) by mouth daily. 02/22/15  Yes Richard Maceo Pro., MD  triamcinolone cream (KENALOG) 0.1 % See admin  instructions. Use as directed 12/03/14  Yes Historical Provider, MD    Patient Active Problem List   Diagnosis Date Noted  . Allergic rhinitis 11/12/2014  . Airway hyperreactivity 11/12/2014  . Atherosclerosis of coronary artery 11/12/2014  . Cheilitis 11/12/2014  . Narrowing of intervertebral disc space 11/12/2014  . Deflected nasal septum 11/12/2014  . Essential (primary) hypertension 11/12/2014  . Esophageal reflux 11/12/2014  . HLD (hyperlipidemia) 11/12/2014  . BP (high blood pressure) 11/12/2014  . Adult hypothyroidism 11/12/2014  . Adiposity 11/12/2014  . Apnea, sleep 11/12/2014  . Diabetes mellitus, type 2 11/12/2014  . Arteriosclerosis of coronary artery 10/02/2012    No past medical history on file.  Social History   Social History  . Marital Status: Married    Spouse Name: N/A  . Number of Children: N/A  . Years of Education: N/A   Occupational History  . Not on file.   Social History Main Topics  . Smoking status: Former Smoker    Types: Cigars  . Smokeless tobacco: Never Used  . Alcohol Use: 0.6 oz/week    1 Standard drinks or equivalent per week  . Drug Use: No  . Sexual Activity: Not on file   Other Topics Concern  . Not on file   Social History Narrative    Allergies  Allergen Reactions  . Ace Inhibitors     Other reaction(s): Unknown  . Sulfa Antibiotics Other (See Comments)  . Penicillins Rash    Review of Systems  Constitutional: Negative.   Respiratory: Negative.   Cardiovascular: Negative.   Gastrointestinal: Negative.   Musculoskeletal: Negative.   Neurological: Negative.   Endo/Heme/Allergies: Negative.   Psychiatric/Behavioral: Negative.      There is no immunization history on file for this patient. Objective:  BP 110/62 mmHg  Pulse 80  Temp(Src) 97.7 F (36.5 C)  Resp 12  Wt 203 lb (92.08 kg)  Physical Exam  Constitutional: He is oriented to person, place, and time and well-developed, well-nourished, and in no  distress.  HENT:  Head: Normocephalic and atraumatic.  Right Ear: External ear normal.  Left Ear: External ear normal.  Nose: Nose normal.  Eyes: Conjunctivae are normal.  Neck: Neck supple.  Cardiovascular: Normal rate, regular rhythm and normal heart sounds.   Pulmonary/Chest: Effort normal and breath sounds normal.  Abdominal: Soft.  Neurological: He is alert and oriented to person, place, and time.  Skin: Skin is warm and dry.  Psychiatric: Mood, memory, affect and judgment normal.    Lab Results  Component Value Date   WBC 8.1 09/30/2013   HGB 15.3 09/30/2013   HCT 45 09/30/2013   PLT 211 09/30/2013   GLUCOSE 158* 02/03/2013   CHOL 147 09/30/2013   TRIG 86 09/30/2013   HDL 56 09/30/2013   LDLCALC 74 09/30/2013   TSH 1.83 09/30/2013   PSA 0.5 09/30/2013   HGBA1C 7.7* 09/23/2014    CMP     Component Value Date/Time   NA 142 09/30/2013   NA 137 02/03/2013 2223  K 4.4 09/30/2013   K 4.0 02/03/2013 2223   CL 106 02/03/2013 2223   CO2 28 02/03/2013 2223   GLUCOSE 158* 02/03/2013 2223   BUN 14 09/30/2013   BUN 11 02/03/2013 2223   CREATININE 0.9 09/30/2013   CREATININE 0.69 02/03/2013 2223   CALCIUM 9.3 02/03/2013 2223   PROT 7.5 02/03/2013 2223   ALBUMIN 3.3* 02/03/2013 2223   AST 20 09/30/2013   AST 26 02/03/2013 2223   ALT 17 09/30/2013   ALT 30 02/03/2013 2223   ALKPHOS 109 09/30/2013   ALKPHOS 121 02/03/2013 2223   BILITOT 1.1* 02/03/2013 2223   GFRNONAA >60 02/03/2013 2223   GFRAA >60 02/03/2013 2223    Assessment and Plan :   1. Gastroesophageal reflux disease without esophagitis  - TSH  2. Hyperlipidemia  - Lipid Panel With LDL/HDL Ratio  3. Essential hypertension  - CBC with Differential/Platelet - Comprehensive metabolic panel - TSH  4. Diabetes type 2, controlled Due to cost patient would like to cut out some medications. He is taking insulin once in the past month and so we'll discontinue this. Discontinue Invokana , as it is a  $400 prescription. Try Actos 15 mg daily - pioglitazone (ACTOS) 15 MG tablet; Take 1 tablet (15 mg total) by mouth daily.  Dispense: 30 tablet; Refill: 12 - POCT A1C--7.7 today--RTC 3-4 months 5. Obesity Patient doing great with diet and exercise. He has lost about 35 pounds. I would like to see him lose another 15-20 pounds.  [name] MD [facility] 03/29/2015 4:35 PM

## 2015-03-31 DIAGNOSIS — K219 Gastro-esophageal reflux disease without esophagitis: Secondary | ICD-10-CM | POA: Diagnosis not present

## 2015-03-31 DIAGNOSIS — I10 Essential (primary) hypertension: Secondary | ICD-10-CM | POA: Diagnosis not present

## 2015-03-31 DIAGNOSIS — E785 Hyperlipidemia, unspecified: Secondary | ICD-10-CM | POA: Diagnosis not present

## 2015-04-01 LAB — COMPREHENSIVE METABOLIC PANEL
A/G RATIO: 1.8 (ref 1.1–2.5)
ALBUMIN: 4.2 g/dL (ref 3.5–4.8)
ALT: 12 IU/L (ref 0–44)
AST: 12 IU/L (ref 0–40)
Alkaline Phosphatase: 92 IU/L (ref 39–117)
BILIRUBIN TOTAL: 1.4 mg/dL — AB (ref 0.0–1.2)
BUN / CREAT RATIO: 25 — AB (ref 10–22)
BUN: 15 mg/dL (ref 8–27)
CALCIUM: 9.9 mg/dL (ref 8.6–10.2)
CHLORIDE: 102 mmol/L (ref 97–108)
CO2: 23 mmol/L (ref 18–29)
Creatinine, Ser: 0.6 mg/dL — ABNORMAL LOW (ref 0.76–1.27)
GFR, EST AFRICAN AMERICAN: 118 mL/min/{1.73_m2} (ref >59)
GFR, EST NON AFRICAN AMERICAN: 102 mL/min/{1.73_m2} (ref >59)
GLOBULIN, TOTAL: 2.3 g/dL (ref 1.5–4.5)
Glucose: 122 mg/dL — ABNORMAL HIGH (ref 65–99)
POTASSIUM: 4.4 mmol/L (ref 3.5–5.2)
Sodium: 140 mmol/L (ref 134–144)
TOTAL PROTEIN: 6.5 g/dL (ref 6.0–8.5)

## 2015-04-01 LAB — LIPID PANEL WITH LDL/HDL RATIO
Cholesterol, Total: 150 mg/dL (ref 100–199)
HDL: 43 mg/dL (ref >39)
LDL Calculated: 88 mg/dL (ref 0–99)
LDL/HDL RATIO: 2 ratio (ref 0.0–3.6)
Triglycerides: 93 mg/dL (ref 0–149)
VLDL CHOLESTEROL CAL: 19 mg/dL (ref 5–40)

## 2015-04-01 LAB — CBC WITH DIFFERENTIAL/PLATELET
BASOS: 1 %
Basophils Absolute: 0 10*3/uL (ref 0.0–0.2)
EOS (ABSOLUTE): 0.4 10*3/uL (ref 0.0–0.4)
EOS: 5 %
HEMATOCRIT: 43.5 % (ref 37.5–51.0)
HEMOGLOBIN: 15 g/dL (ref 12.6–17.7)
IMMATURE GRANS (ABS): 0 10*3/uL (ref 0.0–0.1)
IMMATURE GRANULOCYTES: 0 %
LYMPHS: 16 %
Lymphocytes Absolute: 1.2 10*3/uL (ref 0.7–3.1)
MCH: 30.7 pg (ref 26.6–33.0)
MCHC: 34.5 g/dL (ref 31.5–35.7)
MCV: 89 fL (ref 79–97)
MONOCYTES: 12 %
Monocytes Absolute: 0.9 10*3/uL (ref 0.1–0.9)
NEUTROS ABS: 5.1 10*3/uL (ref 1.4–7.0)
NEUTROS PCT: 66 %
PLATELETS: 213 10*3/uL (ref 150–379)
RBC: 4.89 x10E6/uL (ref 4.14–5.80)
RDW: 13.7 % (ref 12.3–15.4)
WBC: 7.5 10*3/uL (ref 3.4–10.8)

## 2015-04-01 LAB — TSH: TSH: 1.45 u[IU]/mL (ref 0.450–4.500)

## 2015-04-05 ENCOUNTER — Telehealth: Payer: Self-pay

## 2015-04-05 NOTE — Telephone Encounter (Signed)
10:19 am No answer  ED

## 2015-04-05 NOTE — Telephone Encounter (Signed)
-----   Message from Jerrol Banana., MD sent at 04/04/2015  8:56 AM EDT ----- Labs okay

## 2015-04-07 NOTE — Telephone Encounter (Signed)
Pt advised-aa 

## 2015-04-29 ENCOUNTER — Other Ambulatory Visit: Payer: Self-pay

## 2015-04-29 MED ORDER — LEVOTHYROXINE SODIUM 75 MCG PO TABS: 75.0000 ug | ORAL_TABLET | Freq: Every day | ORAL | Status: DC

## 2015-04-29 MED ORDER — LOSARTAN POTASSIUM 100 MG PO TABS: 100.0000 mg | ORAL_TABLET | Freq: Every day | ORAL | Status: DC

## 2015-04-29 MED ORDER — CARVEDILOL 12.5 MG PO TABS: 12.5000 mg | ORAL_TABLET | Freq: Two times a day (BID) | ORAL | Status: DC

## 2015-04-29 MED ORDER — ATORVASTATIN CALCIUM 40 MG PO TABS: 40.0000 mg | ORAL_TABLET | Freq: Every day | ORAL | Status: DC

## 2015-06-28 ENCOUNTER — Encounter: Payer: Self-pay | Admitting: Family Medicine

## 2015-06-28 ENCOUNTER — Ambulatory Visit (INDEPENDENT_AMBULATORY_CARE_PROVIDER_SITE_OTHER): Payer: Medicare Other | Admitting: Family Medicine

## 2015-06-28 VITALS — BP 144/86 | HR 64 | Temp 98.2°F | Resp 14 | Wt 206.0 lb

## 2015-06-28 DIAGNOSIS — E785 Hyperlipidemia, unspecified: Secondary | ICD-10-CM | POA: Diagnosis not present

## 2015-06-28 DIAGNOSIS — I10 Essential (primary) hypertension: Secondary | ICD-10-CM

## 2015-06-28 DIAGNOSIS — E119 Type 2 diabetes mellitus without complications: Secondary | ICD-10-CM

## 2015-06-28 DIAGNOSIS — K4090 Unilateral inguinal hernia, without obstruction or gangrene, not specified as recurrent: Secondary | ICD-10-CM

## 2015-06-28 DIAGNOSIS — E038 Other specified hypothyroidism: Secondary | ICD-10-CM | POA: Diagnosis not present

## 2015-06-28 DIAGNOSIS — E669 Obesity, unspecified: Secondary | ICD-10-CM

## 2015-06-28 DIAGNOSIS — Z1211 Encounter for screening for malignant neoplasm of colon: Secondary | ICD-10-CM | POA: Diagnosis not present

## 2015-06-28 DIAGNOSIS — I25119 Atherosclerotic heart disease of native coronary artery with unspecified angina pectoris: Secondary | ICD-10-CM

## 2015-06-28 LAB — POCT GLYCOSYLATED HEMOGLOBIN (HGB A1C): HEMOGLOBIN A1C: 7.3

## 2015-06-28 MED ORDER — PIOGLITAZONE HCL 30 MG PO TABS: 30.0000 mg | ORAL_TABLET | Freq: Every day | ORAL | Status: DC

## 2015-06-28 NOTE — Progress Notes (Signed)
Patient ID: Patrick Mcgee, male   DOB: Jul 20, 1945, 69 y.o.   MRN: [id number]    Subjective:  HPI  Patient is here for for Diabetes follow up. Patient was given RX for Actos on his last visit when he finishes with Invokana but patient is still taking Invokana and has not started Actos yet. His sugars for fasting around 120 or below and evening around 180 or 200. He had a reading of 270 recently, that was the highest. He takes Novolog but when needed used it about 4 times this month. He wants to dicuss stopping this or changing due to price. He has never tried any other insulin. He also wants to stop or change Januvia due to price.  He wants to discuss stopping Levothyroxine, he states this medication originally was started due to the nodules in his neck but since they are not there anymore he thought he could stop taking this maybe. Lab Results  Component Value Date   TSH 1.450 03/31/2015   He has had some pain and burning sensation with his right sided hernia again and wants to discuss referral now. He states this was mentioned with [place] before but has not seen a specialist for it.  He wanted to discuss getting colonoscopy set up-his last one was on 02/05/08 and it was recommended to repeat this in 5 years but this has not been repeated.   Prior to Admission medications   Medication Sig Start Date End Date Taking? Authorizing Provider  aspirin 81 MG tablet Take by mouth. 04/03/13  Yes Historical Provider, MD  atorvastatin (LIPITOR) 40 MG tablet Take 1 tablet (40 mg total) by mouth daily. 04/29/15  Yes Addisen Chappelle Maceo Pro., MD  budesonide-formoterol [facility]) 80-4.5 MCG/ACT inhaler Inhale 2 puffs into the lungs daily as needed. 02/22/15  Yes Yeimi Debnam Maceo Pro., MD  carvedilol (COREG) 12.5 MG tablet Take 1 tablet (12.5 mg total) by mouth 2 (two) times daily. 04/29/15  Yes Sandy Haye Maceo Pro., MD  Glucose Blood DISK BAYER BREEZE 2 TEST (In Vitro Disk)  1 Disk check sugar twice daily for 0  days  Quantity: 3;  Refills: 3   Ordered :19-May-2014  [name] MD;  Started 19-May-2014 Active Comments: DX: E11.9-QS 90 day supply- 05/19/14  Yes Historical Provider, MD  levothyroxine (SYNTHROID, LEVOTHROID) 75 MCG tablet Take 1 tablet (75 mcg total) by mouth daily. 04/29/15  Yes Kazim Corrales Maceo Pro., MD  losartan (COZAAR) 100 MG tablet Take 1 tablet (100 mg total) by mouth daily. 04/29/15  Yes Aunika Kirsten Maceo Pro., MD  meclizine (ANTIVERT) 25 MG tablet Take by mouth.   Yes Historical Provider, MD  nystatin cream (MYCOSTATIN) NYSTATIN, 100000 UNIT/GM (External Cream) - Historical Medication  apply to affected area daily, as needed (100000 UNIT/GM) Active Comments: Medication taken as needed.   Yes Historical Provider, MD  nystatin-triamcinolone (MYCOLOG II) cream Apply topically See admin instructions. Use as directed 10/07/09  Yes Historical Provider, MD  sitaGLIPtin (JANUVIA) 100 MG tablet Take 1 tablet (100 mg total) by mouth daily. 02/22/15  Yes Karver Fadden Maceo Pro., MD  triamcinolone cream (KENALOG) 0.1 % See admin instructions. Use as directed 12/03/14  Yes Historical Provider, MD  pioglitazone (ACTOS) 15 MG tablet Take 1 tablet (15 mg total) by mouth daily. Patient not taking: Reported on 06/28/2015 03/29/15   Jerrol Banana., MD    Patient Active Problem List   Diagnosis Date Noted  . Allergic rhinitis 11/12/2014  . Airway hyperreactivity  11/12/2014  . Atherosclerosis of coronary artery 11/12/2014  . Cheilitis 11/12/2014  . Narrowing of intervertebral disc space 11/12/2014  . Deflected nasal septum 11/12/2014  . HLD (hyperlipidemia) 11/12/2014  . BP (high blood pressure) 11/12/2014  . Adult hypothyroidism 11/12/2014  . Adiposity 11/12/2014  . Diabetes mellitus, type 2 ([place]) 11/12/2014  . Arteriosclerosis of coronary artery 10/02/2012    No past medical history on file.  Social History   Social History  . Marital Status: Married    Spouse Name: N/A  . Number  of Children: N/A  . Years of Education: N/A   Occupational History  . Not on file.   Social History Main Topics  . Smoking status: Former Smoker    Types: Cigars  . Smokeless tobacco: Never Used  . Alcohol Use: 0.6 oz/week    1 Standard drinks or equivalent per week  . Drug Use: No  . Sexual Activity: Not on file   Other Topics Concern  . Not on file   Social History Narrative    Allergies  Allergen Reactions  . Ace Inhibitors     Other reaction(s): Unknown  . Sulfa Antibiotics Other (See Comments)  . Penicillins Rash    Review of Systems  Constitutional: Negative.   HENT: Negative.   Eyes: Negative.   Respiratory: Negative.   Cardiovascular: Negative.   Gastrointestinal: Negative.   Genitourinary: Negative.        Reducible right inguinal hernia  Musculoskeletal: Negative.   Skin: Negative.   Neurological: Negative.   Psychiatric/Behavioral: Negative.      There is no immunization history on file for this patient. Objective:  BP 144/86 mmHg  Pulse 64  Temp(Src) 98.2 F (36.8 C)  Resp 14  Wt 206 lb (93.441 kg)  Physical Exam  Constitutional: He is oriented to person, place, and time and well-developed, well-nourished, and in no distress.  HENT:  Head: Normocephalic and atraumatic.  Eyes: Conjunctivae are normal. Pupils are equal, round, and reactive to light.  Neck: Normal range of motion. Neck supple.  Cardiovascular: Normal rate, regular rhythm, normal heart sounds and intact distal pulses.   No murmur heard. Pulmonary/Chest: Effort normal and breath sounds normal. No respiratory distress. He has no wheezes.  Abdominal: A hernia (mild reducable hernia on the right) is present.  Musculoskeletal: Normal range of motion. He exhibits no edema or tenderness.  Neurological: He is oriented to person, place, and time.  Psychiatric: Mood, memory, affect and judgment normal.    Lab Results  Component Value Date   WBC 7.5 03/31/2015   HGB 15.3 09/30/2013    HCT 43.5 03/31/2015   PLT 211 09/30/2013   GLUCOSE 122* 03/31/2015   CHOL 150 03/31/2015   TRIG 93 03/31/2015   HDL 43 03/31/2015   LDLCALC 88 03/31/2015   TSH 1.450 03/31/2015   PSA 0.5 09/30/2013   HGBA1C 7.7* 09/23/2014    CMP     Component Value Date/Time   NA 140 03/31/2015 0904   NA 137 02/03/2013 2223   K 4.4 03/31/2015 0904   K 4.0 02/03/2013 2223   CL 102 03/31/2015 0904   CL 106 02/03/2013 2223   CO2 23 03/31/2015 0904   CO2 28 02/03/2013 2223   GLUCOSE 122* 03/31/2015 0904   GLUCOSE 158* 02/03/2013 2223   BUN 15 03/31/2015 0904   BUN 11 02/03/2013 2223   CREATININE 0.60* 03/31/2015 0904   CREATININE 0.9 09/30/2013   CREATININE 0.69 02/03/2013 2223   CALCIUM 9.9 03/31/2015  [id number]   CALCIUM 9.3 02/03/2013 2223   PROT 6.5 03/31/2015 0904   PROT 7.5 02/03/2013 2223   ALBUMIN 4.2 03/31/2015 0904   ALBUMIN 3.3* 02/03/2013 2223   AST 12 03/31/2015 0904   AST 26 02/03/2013 2223   ALT 12 03/31/2015 0904   ALT 30 02/03/2013 2223   ALKPHOS 92 03/31/2015 0904   ALKPHOS 121 02/03/2013 2223   BILITOT 1.4* 03/31/2015 0904   BILITOT 1.1* 02/03/2013 2223   GFRNONAA 102 03/31/2015 0904   GFRNONAA >60 02/03/2013 2223   GFRAA 118 03/31/2015 0904   GFRAA >60 02/03/2013 2223    Assessment and Plan :  1. Essential hypertension Stable.  2. Type 2 diabetes mellitus without complication, without long-term current use of insulin (HCC) A1C 7.3 now. Better. Due to price and slight frequency of insulin use will stop Novolog and due to price will stop Januvia. Will try Actos only and see if working on his habits as he has been doing and the medication if sugar stays in good control.last A1c was 7.7. Re check on the next visit.  3. Other specified hypothyroidism patient wants to stop Levothyroxine  and see what his levels are on the next visit. 4. HLD (hyperlipidemia)  5. Adiposity Patient has done well with weight loss in the last 6 months and is following diet and  exercise routinely now. These are new changes for him. I have encouraged him to stay with these changes. 6. Colon cancer screening Refer for colonoscopy  7. Right groin hernia Will refer to surgeon.  [name] MD [facility] 06/28/2015 4:22 PM

## 2015-06-29 ENCOUNTER — Encounter: Payer: Self-pay | Admitting: *Deleted

## 2015-07-08 ENCOUNTER — Other Ambulatory Visit: Payer: Self-pay

## 2015-07-08 MED ORDER — GLUCOSE BLOOD VI DISK: DISK | Status: DC

## 2015-07-15 ENCOUNTER — Encounter: Payer: Self-pay | Admitting: General Surgery

## 2015-07-15 ENCOUNTER — Ambulatory Visit (INDEPENDENT_AMBULATORY_CARE_PROVIDER_SITE_OTHER): Payer: Medicare Other | Admitting: General Surgery

## 2015-07-15 VITALS — BP 124/70 | HR 76 | Resp 16 | Ht 68.0 in | Wt 203.0 lb

## 2015-07-15 DIAGNOSIS — Z800 Family history of malignant neoplasm of digestive organs: Secondary | ICD-10-CM | POA: Diagnosis not present

## 2015-07-15 DIAGNOSIS — K4090 Unilateral inguinal hernia, without obstruction or gangrene, not specified as recurrent: Secondary | ICD-10-CM

## 2015-07-15 NOTE — Patient Instructions (Addendum)
The patient is aware to call back for any questions or concerns. Biotin for hair loss. Inguinal Hernia, Adult Muscles help keep everything in the body in its proper place. But if a weak spot in the muscles develops, something can poke through. That is called a hernia. When this happens in the lower part of the belly (abdomen), it is called an inguinal hernia. (It takes its name from a part of the body in this region called the inguinal canal.) A weak spot in the wall of muscles lets some fat or part of the small intestine bulge through. An inguinal hernia can develop at any age. Men get them more often than women. CAUSES  In adults, an inguinal hernia develops over time.  It can be triggered by:  Suddenly straining the muscles of the lower abdomen.  Lifting heavy objects.  Straining to have a bowel movement. Difficult bowel movements (constipation) can lead to this.  Constant coughing. This may be caused by smoking or lung disease.  Being overweight.  Being pregnant.  Working at a job that requires long periods of standing or heavy lifting.  Having had an inguinal hernia before. One type can be an emergency situation. It is called a strangulated inguinal hernia. It develops if part of the small intestine slips through the weak spot and cannot get back into the abdomen. The blood supply can be cut off. If that happens, part of the intestine may die. This situation requires emergency surgery. SYMPTOMS  Often, a small inguinal hernia has no symptoms. It is found when a healthcare provider does a physical exam. Larger hernias usually have symptoms.   In adults, symptoms may include:  A lump in the groin. This is easier to see when the person is standing. It might disappear when lying down.  In men, a lump in the scrotum.  Pain or burning in the groin. This occurs especially when lifting, straining or coughing.  A dull ache or feeling of pressure in the groin.  Signs of a  strangulated hernia can include:  A bulge in the groin that becomes very painful and tender to the touch.  A bulge that turns red or purple.  Fever, nausea and vomiting.  Inability to have a bowel movement or to pass gas. DIAGNOSIS  To decide if you have an inguinal hernia, a healthcare provider will probably do a physical examination.  This will include asking questions about any symptoms you have noticed.  The healthcare provider might feel the groin area and ask you to cough. If an inguinal hernia is felt, the healthcare provider may try to slide it back into the abdomen.  Usually no other tests are needed. TREATMENT  Treatments can vary. The size of the hernia makes a difference. Options include:  Watchful waiting. This is often suggested if the hernia is small and you have had no symptoms.  No medical procedure will be done unless symptoms develop.  You will need to watch closely for symptoms. If any occur, contact your healthcare provider right away.  Surgery. This is used if the hernia is larger or you have symptoms.  Open surgery. This is usually an outpatient procedure (you will not stay overnight in a hospital). An cut (incision) is made through the skin in the groin. The hernia is put back inside the abdomen. The weak area in the muscles is then repaired by herniorrhaphy or hernioplasty. Herniorrhaphy: in this type of surgery, the weak muscles are sewn back together. Hernioplasty: a  patch or mesh is used to close the weak area in the abdominal wall.  Laparoscopy. In this procedure, a surgeon makes small incisions. A thin tube with a tiny video camera (called a laparoscope) is put into the abdomen. The surgeon repairs the hernia with mesh by looking with the video camera and using two long instruments. HOME CARE INSTRUCTIONS   After surgery to repair an inguinal hernia:  You will need to take pain medicine prescribed by your healthcare provider. Follow all directions  carefully.  You will need to take care of the wound from the incision.  Your activity will be restricted for awhile. This will probably include no heavy lifting for several weeks. You also should not do anything too active for a few weeks. When you can return to work will depend on the type of job that you have.  During "watchful waiting" periods, you should:  Maintain a healthy weight.  Eat a diet high in fiber (fruits, vegetables and whole grains).  Drink plenty of fluids to avoid constipation. This means drinking enough water and other liquids to keep your urine clear or pale yellow.  Do not lift heavy objects.  Do not stand for long periods of time.  Quit smoking. This should keep you from developing a frequent cough. SEEK MEDICAL CARE IF:   A bulge develops in your groin area.  You feel pain, a burning sensation or pressure in the groin. This might be worse if you are lifting or straining.  You develop a fever of more than 100.5 F (38.1 C). SEEK IMMEDIATE MEDICAL CARE IF:   Pain in the groin increases suddenly.  A bulge in the groin gets bigger suddenly and does not go down.  For men, there is sudden pain in the scrotum. Or, the size of the scrotum increases.  A bulge in the groin area becomes red or purple and is painful to touch.  You have nausea or vomiting that does not go away.  You feel your heart beating much faster than normal.  You cannot have a bowel movement or pass gas.  You develop a fever of more than 102.0 F (38.9 C).   This information is not intended to replace advice given to you by your health care provider. Make sure you discuss any questions you have with your health care provider.   Document Released: 11/12/2008 Document Revised: 09/18/2011 Document Reviewed: 12/28/2014 Elsevier Interactive Patient Education 2016 [employer].  Patient's surgery has been scheduled for 08-09-15 at [facility].

## 2015-07-15 NOTE — Progress Notes (Signed)
Patient ID: Patrick Mcgee, male   DOB: 17-Jan-1946, 70 y.o.   MRN: [id number]  Chief Complaint  Patient presents with  . Other    right inguinal hernia    HPI Patrick Mcgee is a 70 y.o. male here today for a evaluation of a right inguinal hernia. Patient noticed a bulge for about 10 years. He states that since he is walking more for exercise he notices more pain and "burning". He states that it is near where they go in the groin for his stents. He states the bulge is larger than before especially when he exercise. His last colonoscopy was 2007. He has lost about 40 pounds on nutri- system since last April. I reviewed the patient's history. HPI  Past Medical History  Diagnosis Date  . Hypertension   . Hyperlipidemia   . Diabetes mellitus without complication ([place])   . Myocardial infarction ([place]) 1995    Past Surgical History  Procedure Laterality Date  . Coronary angioplasty with stent placement  1995, 1998, 2000, 2001, 2014  . Cataract extraction Left   . Nasal sinus surgery    . Colonoscopy  2010    [facility]    Family History  Problem Relation Age of Onset  . Heart disease Mother   . Cancer Father     Lung and colon cancer  . Heart disease Father   . Emphysema Maternal Grandfather   . Tuberculosis Maternal Grandfather     Social History Social History  Substance Use Topics  . Smoking status: Former Smoker    Types: Cigars  . Smokeless tobacco: Never Used  . Alcohol Use: 0.6 oz/week    1 Standard drinks or equivalent per week    Allergies  Allergen Reactions  . Ace Inhibitors     Other reaction(s): Unknown  . Sulfa Antibiotics Other (See Comments)  . Penicillins Rash    Current Outpatient Prescriptions  Medication Sig Dispense Refill  . aspirin 81 MG tablet Take by mouth.    Marland Kitchen atorvastatin (LIPITOR) 40 MG tablet Take 1 tablet (40 mg total) by mouth daily. 90 tablet 3  . budesonide-formoterol (SYMBICORT) 80-4.5 MCG/ACT inhaler Inhale 2 puffs into the lungs  daily as needed. 3 Inhaler 3  . canagliflozin (INVOKANA) 300 MG TABS tablet Take 300 mg by mouth daily before breakfast.    . carvedilol (COREG) 12.5 MG tablet Take 1 tablet (12.5 mg total) by mouth 2 (two) times daily. 180 tablet 3  . Glucose Blood (BAYER BREEZE 2 TEST) DISK Check sugar once daily DX: E11.9 100 each 3  . Glucose Blood DISK BAYER BREEZE 2 TEST (In Vitro Disk)  1 Disk check sugar twice daily for 0 days  Quantity: 3;  Refills: 3   Ordered :19-May-2014  [name] MD;  Started 19-May-2014 Active Comments: DX: E11.9-QS 90 day supply-    . levothyroxine (SYNTHROID, LEVOTHROID) 75 MCG tablet Take 75 mcg by mouth daily before breakfast.    . losartan (COZAAR) 100 MG tablet Take 1 tablet (100 mg total) by mouth daily. 90 tablet 3  . meclizine (ANTIVERT) 25 MG tablet Take by mouth.    . nystatin cream (MYCOSTATIN) NYSTATIN, 100000 UNIT/GM (External Cream) - Historical Medication  apply to affected area daily, as needed (100000 UNIT/GM) Active Comments: Medication taken as needed.    . nystatin-triamcinolone (MYCOLOG II) cream Apply topically See admin instructions. Use as directed    . pioglitazone (ACTOS) 30 MG tablet Take 1 tablet (30 mg total) by mouth daily.  90 tablet 3  . triamcinolone cream (KENALOG) 0.1 % See admin instructions. Use as directed     No current facility-administered medications for this visit.    Review of Systems Review of Systems  Constitutional: Negative.   Respiratory: Negative.   Cardiovascular: Negative.     Blood pressure 124/70, pulse 76, resp. rate 16, height 5\' 8"  (1.727 m), weight 203 lb (92.08 kg).  Physical Exam Physical Exam  Constitutional: He is oriented to person, place, and time. He appears well-developed and well-nourished.  HENT:  Mouth/Throat: Oropharynx is clear and moist.  Eyes: Conjunctivae are normal. No scleral icterus.  Neck: Neck supple.  Cardiovascular: Normal rate, regular rhythm and normal heart sounds.   No lower leg  edema.  Pulmonary/Chest: Effort normal and breath sounds normal.  Abdominal: Soft. Normal appearance and bowel sounds are normal. There is no tenderness. A hernia is present. Hernia confirmed positive in the right inguinal area.  Reducible right inguinal hernia.  Lymphadenopathy:    He has no cervical adenopathy.  Neurological: He is alert and oriented to person, place, and time.  Skin: Skin is warm and dry.  Psychiatric: His behavior is normal.    Data Reviewed PCP notes.  Assessment    Symptomatic right inguinal hernia.    Plan    Opportunity to have his colonoscopy and hernia repair completed at the same setting were discussed but declined.    Hernia precautions and incarceration were discussed with the patient. If they develop symptoms of an incarcerated hernia, they were encouraged to seek prompt medical attention.  I have recommended repair of the hernia using mesh on an outpatient basis in the near future. The risk of infection was reviewed. The role of prosthetic mesh to minimize the risk of recurrence was reviewed.  Patient's surgery has been scheduled for 08-09-15 at [facility]. It is okay for patient to continue 81 mg aspirin once daily.  PCP/Ref:  [name] This information has been scribed by Karie Fetch RNBC. Marland Kitchen  Patrick Mcgee 07/17/2015, 9:23 AM

## 2015-07-16 ENCOUNTER — Inpatient Hospital Stay: Admission: RE | Admit: 2015-07-16 | Payer: Self-pay | Source: Ambulatory Visit

## 2015-07-17 DIAGNOSIS — Z800 Family history of malignant neoplasm of digestive organs: Secondary | ICD-10-CM | POA: Insufficient documentation

## 2015-07-17 DIAGNOSIS — K4090 Unilateral inguinal hernia, without obstruction or gangrene, not specified as recurrent: Secondary | ICD-10-CM | POA: Insufficient documentation

## 2015-07-17 NOTE — H&P (Signed)
HPI  Patrick Mcgee is a 70 y.o. male here today for a evaluation of a right inguinal hernia. Patient noticed a bulge for about 10 years. He states that since he is walking more for exercise he notices more pain and "burning". He states that it is near where they go in the groin for his stents. He states the bulge is larger than before especially when he exercise.  His last colonoscopy was 2007.  He has lost about 40 pounds on nutri- system since last April.  I reviewed the patient's history.  HPI  Past Medical History   Diagnosis  Date   .  Hypertension    .  Hyperlipidemia    .  Diabetes mellitus without complication ([place])    .  Myocardial infarction ([place])  1995    Past Surgical History   Procedure  Laterality  Date   .  Coronary angioplasty with stent placement   1995, 1998, 2000, 2001, 2014   .  Cataract extraction  Left    .  Nasal sinus surgery     .  Colonoscopy   2010     [facility]    Family History   Problem  Relation  Age of Onset   .  Heart disease  Mother    .  Cancer  Father      Lung and colon cancer   .  Heart disease  Father    .  Emphysema  Maternal Grandfather    .  Tuberculosis  Maternal Grandfather     Social History  Social History   Substance Use Topics   .  Smoking status:  Former Smoker     Types:  Cigars   .  Smokeless tobacco:  Never Used   .  Alcohol Use:  0.6 oz/week     1 Standard drinks or equivalent per week    Allergies   Allergen  Reactions   .  Ace Inhibitors      Other reaction(s): Unknown   .  Sulfa Antibiotics  Other (See Comments)   .  Penicillins  Rash    Current Outpatient Prescriptions   Medication  Sig  Dispense  Refill   .  aspirin 81 MG tablet  Take by mouth.     Marland Kitchen  atorvastatin (LIPITOR) 40 MG tablet  Take 1 tablet (40 mg total) by mouth daily.  90 tablet  3   .  budesonide-formoterol (SYMBICORT) 80-4.5 MCG/ACT inhaler  Inhale 2 puffs into the lungs daily as needed.  3 Inhaler  3   .  canagliflozin (INVOKANA) 300 MG TABS  tablet  Take 300 mg by mouth daily before breakfast.     .  carvedilol (COREG) 12.5 MG tablet  Take 1 tablet (12.5 mg total) by mouth 2 (two) times daily.  180 tablet  3   .  Glucose Blood (BAYER BREEZE 2 TEST) DISK  Check sugar once daily DX: E11.9  100 each  3   .  Glucose Blood DISK  BAYER BREEZE 2 TEST (In Vitro Disk) 1 Disk check sugar twice daily for 0 days Quantity: 3; Refills: 3 Ordered :19-May-2014 Patrick Aschoff MD; Started 19-May-2014 Active Comments: DX: E11.9-QS 90 day supply-     .  levothyroxine (SYNTHROID, LEVOTHROID) 75 MCG tablet  Take 75 mcg by mouth daily before breakfast.     .  losartan (COZAAR) 100 MG tablet  Take 1 tablet (100 mg total) by mouth daily.  90 tablet  3   .  meclizine (ANTIVERT) 25 MG tablet  Take by mouth.     .  nystatin cream (MYCOSTATIN)  NYSTATIN, 100000 UNIT/GM (External Cream) - Historical Medication apply to affected area daily, as needed (100000 UNIT/GM) Active Comments: Medication taken as needed.     .  nystatin-triamcinolone (MYCOLOG II) cream  Apply topically See admin instructions. Use as directed     .  pioglitazone (ACTOS) 30 MG tablet  Take 1 tablet (30 mg total) by mouth daily.  90 tablet  3   .  triamcinolone cream (KENALOG) 0.1 %  See admin instructions. Use as directed      No current facility-administered medications for this visit.    Review of Systems  Review of Systems  Constitutional: Negative.  Respiratory: Negative.  Cardiovascular: Negative.   Blood pressure 124/70, pulse 76, resp. rate 16, height 5\' 8"  (1.727 m), weight 203 lb (92.08 kg).  Physical Exam  Physical Exam  Constitutional: He is oriented to person, place, and time. He appears well-developed and well-nourished.  HENT:  Mouth/Throat: Oropharynx is clear and moist.  Eyes: Conjunctivae are normal. No scleral icterus.  Neck: Neck supple.  Cardiovascular: Normal rate, regular rhythm and normal heart sounds.  No lower leg edema.  Pulmonary/Chest: Effort normal and  breath sounds normal.  Abdominal: Soft. Normal appearance and bowel sounds are normal. There is no tenderness. A hernia is present. Hernia confirmed positive in the right inguinal area.  Reducible right inguinal hernia.  Lymphadenopathy:  He has no cervical adenopathy.  Neurological: He is alert and oriented to person, place, and time.  Skin: Skin is warm and dry.  Psychiatric: His behavior is normal.   Data Reviewed  PCP notes.  Assessment   Symptomatic right inguinal hernia.   Plan   Opportunity to have his colonoscopy and hernia repair completed at the same setting were discussed but declined.   Hernia precautions and incarceration were discussed with the patient. If they develop symptoms of an incarcerated hernia, they were encouraged to seek prompt medical attention.  I have recommended repair of the hernia using mesh on an outpatient basis in the near future. The risk of infection was reviewed. The role of prosthetic mesh to minimize the risk of recurrence was reviewed.  Patient's surgery has been scheduled for 08-09-15 at [facility]. It is okay for patient to continue 81 mg aspirin once daily.  PCP/Ref: Patrick Mcgee  This information has been scribed by Patrick Mcgee RNBC.  Marland Kitchen  Patrick Mcgee  07/17/2015, 9:23 AM

## 2015-07-26 ENCOUNTER — Other Ambulatory Visit: Payer: Self-pay

## 2015-07-26 ENCOUNTER — Encounter
Admission: RE | Admit: 2015-07-26 | Discharge: 2015-07-26 | Disposition: A | Discharge status: Home / Self Care | Payer: Medicare Other | Source: Ambulatory Visit | Attending: General Surgery | Admitting: General Surgery

## 2015-07-26 DIAGNOSIS — Z01810 Encounter for preprocedural cardiovascular examination: Secondary | ICD-10-CM | POA: Insufficient documentation

## 2015-07-26 DIAGNOSIS — I10 Essential (primary) hypertension: Secondary | ICD-10-CM | POA: Diagnosis not present

## 2015-07-26 LAB — CBC
HEMATOCRIT: 44.7 % (ref 40.0–52.0)
HEMOGLOBIN: 14.9 g/dL (ref 13.0–18.0)
MCH: 30.1 pg (ref 26.0–34.0)
MCHC: 33.3 g/dL (ref 32.0–36.0)
MCV: 90.3 fL (ref 80.0–100.0)
Platelets: 189 10*3/uL (ref 150–440)
RBC: 4.95 MIL/uL (ref 4.40–5.90)
RDW: 13.9 % (ref 11.5–14.5)
WBC: 5.6 10*3/uL (ref 3.8–10.6)

## 2015-07-26 LAB — BASIC METABOLIC PANEL
Anion gap: 6 (ref 5–15)
BUN: 20 mg/dL (ref 6–20)
CHLORIDE: 106 mmol/L (ref 101–111)
CO2: 26 mmol/L (ref 22–32)
Calcium: 9.5 mg/dL (ref 8.9–10.3)
Creatinine, Ser: 0.73 mg/dL (ref 0.61–1.24)
GFR calc non Af Amer: >60 mL/min (ref >60)
Glucose, Bld: 192 mg/dL — ABNORMAL HIGH (ref 65–99)
POTASSIUM: 4.4 mmol/L (ref 3.5–5.1)
SODIUM: 138 mmol/L (ref 135–145)

## 2015-07-26 NOTE — Patient Instructions (Signed)
  Your procedure is scheduled on: Monday 08/09/2015 Report to Day Surgery. 2ND FLOOR MEDICAL MALL ENTRANCE To find out your arrival time please call [phone] between 1PM - 3PM on Friday 08/06/2015.  Remember: Instructions that are not followed completely may result in serious medical risk, up to and including death, or upon the discretion of your surgeon and anesthesiologist your surgery may need to be rescheduled.    __X__ 1. Do not eat food or drink liquids after midnight. No gum chewing or hard candies.     __X__ 2. No Alcohol for 24 hours before or after surgery.   ____ 3. Bring all medications with you on the day of surgery if instructed.    __X__ 4. Notify your doctor if there is any change in your medical condition     (cold, fever, infections).     Do not wear jewelry, make-up, hairpins, clips or nail polish.  Do not wear lotions, powders, or perfumes.   Do not shave 48 hours prior to surgery. Men may shave face and neck.  Do not bring valuables to the hospital.    [facility] is not responsible for any belongings or valuables.               Contacts, dentures or bridgework may not be worn into surgery.  Leave your suitcase in the car. After surgery it may be brought to your room.  For patients admitted to the hospital, discharge time is determined by your                treatment team.   Patients discharged the day of surgery will not be allowed to drive home.   Please read over the following fact sheets that you were given:   Surgical Site Infection Prevention   __X__ Take these medicines the morning of surgery with A SIP OF WATER:    1. ATORVASTATIN  2. LEVOTHYROXINE  3. CARVEDILOL  4.  5.  6.  ____ Fleet Enema (as directed)   __X__ Use CHG Soap as directed  __X__ Use inhalers on the day of surgery  ____ Stop metformin 2 days prior to surgery    __X__ Take 1/2 of usual insulin dose the night before surgery and none on the morning of surgery.   __X__  Stop Coumadin/Plavix/aspirin on 07/30/2015  ____ Stop Anti-inflammatories on    ____ Stop supplements until after surgery.    __X__ Bring C-Pap to the hospital.

## 2015-07-26 NOTE — Pre-Procedure Instructions (Addendum)
Discussed patient EKG with Dr Marcello Moores inferior infarct noted on EKG from 03/07/2011, ok to proceed

## 2015-07-27 NOTE — Pre-Procedure Instructions (Signed)
EKG sent to Dr. Bary Castilla and anesthesia for review.

## 2015-07-28 ENCOUNTER — Encounter: Payer: Self-pay | Admitting: *Deleted

## 2015-07-28 NOTE — Pre-Procedure Instructions (Signed)
As instructed by DR Amie Critchley, REQUEST FOR CARDIAC CLEARANCE CALLED AND FAXED TO DR WM KRAUS. HAD TO LEAVE MESSAGE ON HIS VOICEMAIL. ALSO CALLED AND FAXED TO [place] AT DR BYRNETT OFFICE

## 2015-08-02 ENCOUNTER — Other Ambulatory Visit: Payer: Self-pay

## 2015-08-03 MED ORDER — FAMOTIDINE 20 MG PO TABS
ORAL_TABLET | ORAL | Status: AC
  Filled 2015-08-03: qty 1

## 2015-08-03 NOTE — Pre-Procedure Instructions (Signed)
[place] MODERATE RISK 07/29/15

## 2015-08-03 NOTE — Pre-Procedure Instructions (Signed)
LEFT MESSAGE FOR DR Thornell Mule RE STATUS OF CARDIAC CLEARANCE

## 2015-08-05 ENCOUNTER — Encounter: Payer: Self-pay | Admitting: *Deleted

## 2015-08-05 NOTE — Progress Notes (Signed)
Patient ID: Patrick Mcgee, male   DOB: 12/28/1945, 70 y.o.   MRN: [id number]  Cardiac clearance from Dr. [name] at [facility] (Phone: [phone]: [phone]) has been received today.   Per Fraser Din in Pre-admission Testing, they have also received clearance.   Patient was notified of this today.   We will proceed with surgery as scheduled for 08-09-15 at [facility].

## 2015-08-09 ENCOUNTER — Encounter: Payer: Self-pay | Admitting: *Deleted

## 2015-08-09 ENCOUNTER — Ambulatory Visit: Payer: Medicare Other | Admitting: Certified Registered Nurse Anesthetist

## 2015-08-09 ENCOUNTER — Encounter
Admission: RE | Disposition: A | Discharge status: Home / Self Care | Payer: Self-pay | Source: Ambulatory Visit | Attending: General Surgery

## 2015-08-09 ENCOUNTER — Ambulatory Visit
Admission: RE | Admit: 2015-08-09 | Discharge: 2015-08-09 | Disposition: A | Discharge status: Home / Self Care | Payer: Medicare Other | Source: Ambulatory Visit | Attending: General Surgery | Admitting: General Surgery

## 2015-08-09 DIAGNOSIS — Z882 Allergy status to sulfonamides status: Secondary | ICD-10-CM | POA: Diagnosis not present

## 2015-08-09 DIAGNOSIS — Z955 Presence of coronary angioplasty implant and graft: Secondary | ICD-10-CM | POA: Insufficient documentation

## 2015-08-09 DIAGNOSIS — G4730 Sleep apnea, unspecified: Secondary | ICD-10-CM | POA: Insufficient documentation

## 2015-08-09 DIAGNOSIS — Z888 Allergy status to other drugs, medicaments and biological substances status: Secondary | ICD-10-CM | POA: Diagnosis not present

## 2015-08-09 DIAGNOSIS — E785 Hyperlipidemia, unspecified: Secondary | ICD-10-CM | POA: Diagnosis not present

## 2015-08-09 DIAGNOSIS — Z880 Allergy status to penicillin: Secondary | ICD-10-CM | POA: Diagnosis not present

## 2015-08-09 DIAGNOSIS — I252 Old myocardial infarction: Secondary | ICD-10-CM | POA: Diagnosis not present

## 2015-08-09 DIAGNOSIS — I2510 Atherosclerotic heart disease of native coronary artery without angina pectoris: Secondary | ICD-10-CM | POA: Insufficient documentation

## 2015-08-09 DIAGNOSIS — Z87891 Personal history of nicotine dependence: Secondary | ICD-10-CM | POA: Diagnosis not present

## 2015-08-09 DIAGNOSIS — E119 Type 2 diabetes mellitus without complications: Secondary | ICD-10-CM | POA: Diagnosis not present

## 2015-08-09 DIAGNOSIS — K4090 Unilateral inguinal hernia, without obstruction or gangrene, not specified as recurrent: Secondary | ICD-10-CM | POA: Diagnosis not present

## 2015-08-09 DIAGNOSIS — I10 Essential (primary) hypertension: Secondary | ICD-10-CM | POA: Diagnosis not present

## 2015-08-09 HISTORY — PX: INGUINAL HERNIA REPAIR: SHX194

## 2015-08-09 LAB — GLUCOSE, CAPILLARY
GLUCOSE-CAPILLARY: 106 mg/dL — AB (ref 65–99)
Glucose-Capillary: 120 mg/dL — ABNORMAL HIGH (ref 65–99)

## 2015-08-09 SURGERY — REPAIR, HERNIA, INGUINAL, ADULT
Anesthesia: General | Laterality: Right | Wound class: Clean

## 2015-08-09 MED ORDER — CEFAZOLIN SODIUM-DEXTROSE 2-3 GM-% IV SOLR
INTRAVENOUS | Status: AC
  Filled 2015-08-09: qty 50

## 2015-08-09 MED ORDER — LIDOCAINE HCL (CARDIAC) 20 MG/ML IV SOLN
INTRAVENOUS | Status: DC | PRN
  Administered 2015-08-09: 60 mg via INTRAVENOUS

## 2015-08-09 MED ORDER — SODIUM CHLORIDE 0.9 % IV SOLN: INTRAVENOUS | Status: DC

## 2015-08-09 MED ORDER — EPHEDRINE SULFATE 50 MG/ML IJ SOLN
INTRAMUSCULAR | Status: DC | PRN
  Administered 2015-08-09 (×3): 5 mg via INTRAVENOUS

## 2015-08-09 MED ORDER — LACTATED RINGERS IV SOLN
INTRAVENOUS | Status: DC | PRN
  Administered 2015-08-09: 07:00:00 via INTRAVENOUS

## 2015-08-09 MED ORDER — PROPOFOL 10 MG/ML IV BOLUS
INTRAVENOUS | Status: DC | PRN
  Administered 2015-08-09: 120 mg via INTRAVENOUS

## 2015-08-09 MED ORDER — GLYCOPYRROLATE 0.2 MG/ML IJ SOLN
INTRAMUSCULAR | Status: DC | PRN
  Administered 2015-08-09 (×2): 0.1 mg via INTRAVENOUS

## 2015-08-09 MED ORDER — BUPIVACAINE-EPINEPHRINE (PF) 0.5% -1:200000 IJ SOLN
INTRAMUSCULAR | Status: AC
  Filled 2015-08-09: qty 30

## 2015-08-09 MED ORDER — FENTANYL CITRATE (PF) 100 MCG/2ML IJ SOLN
INTRAMUSCULAR | Status: DC | PRN
  Administered 2015-08-09 (×3): 25 ug via INTRAVENOUS

## 2015-08-09 MED ORDER — KETOROLAC TROMETHAMINE 30 MG/ML IJ SOLN
INTRAMUSCULAR | Status: DC | PRN
  Administered 2015-08-09: 30 mg via INTRAVENOUS

## 2015-08-09 MED ORDER — BUPIVACAINE-EPINEPHRINE (PF) 0.5% -1:200000 IJ SOLN
INTRAMUSCULAR | Status: DC | PRN
  Administered 2015-08-09: 30 mL

## 2015-08-09 MED ORDER — MIDAZOLAM HCL 2 MG/2ML IJ SOLN
INTRAMUSCULAR | Status: DC | PRN
  Administered 2015-08-09 (×2): 1 mg via INTRAVENOUS

## 2015-08-09 MED ORDER — DEXAMETHASONE SODIUM PHOSPHATE 10 MG/ML IJ SOLN
INTRAMUSCULAR | Status: DC | PRN
  Administered 2015-08-09: 5 mg via INTRAVENOUS

## 2015-08-09 MED ORDER — HYDROCODONE-ACETAMINOPHEN 5-325 MG PO TABS: 1.0000 | ORAL_TABLET | ORAL | Status: DC | PRN

## 2015-08-09 MED ORDER — ONDANSETRON HCL 4 MG/2ML IJ SOLN
INTRAMUSCULAR | Status: DC | PRN
  Administered 2015-08-09: 4 mg via INTRAVENOUS

## 2015-08-09 MED ORDER — FAMOTIDINE 20 MG PO TABS
20.0000 mg | ORAL_TABLET | Freq: Once | ORAL | Status: AC
  Administered 2015-08-09: 20 mg via ORAL

## 2015-08-09 MED ORDER — CEFAZOLIN SODIUM-DEXTROSE 2-3 GM-% IV SOLR
2.0000 g | INTRAVENOUS | Status: AC
  Administered 2015-08-09: 2 g via INTRAVENOUS

## 2015-08-09 MED ORDER — ACETAMINOPHEN 10 MG/ML IV SOLN
INTRAVENOUS | Status: AC
  Filled 2015-08-09: qty 100

## 2015-08-09 MED ORDER — FAMOTIDINE 20 MG PO TABS
ORAL_TABLET | ORAL | Status: AC
  Filled 2015-08-09: qty 1

## 2015-08-09 MED ORDER — ACETAMINOPHEN 10 MG/ML IV SOLN
INTRAVENOUS | Status: DC | PRN
  Administered 2015-08-09: 1000 mg via INTRAVENOUS

## 2015-08-09 MED ORDER — PHENYLEPHRINE HCL 10 MG/ML IJ SOLN
INTRAMUSCULAR | Status: DC | PRN
  Administered 2015-08-09: 100 ug via INTRAVENOUS

## 2015-08-09 SURGICAL SUPPLY — 34 items
BENZOIN TINCTURE PRP APPL 2/3 (GAUZE/BANDAGES/DRESSINGS) ×3 IMPLANT
BLADE SURG 15 STRL SS SAFETY (BLADE) ×3 IMPLANT
CANISTER SUCT 1200ML W/VALVE (MISCELLANEOUS) ×3 IMPLANT
CHLORAPREP W/TINT 26ML (MISCELLANEOUS) ×3 IMPLANT
CLOSURE WOUND 1/2 X4 (GAUZE/BANDAGES/DRESSINGS) ×1
DECANTER SPIKE VIAL GLASS SM (MISCELLANEOUS) ×3 IMPLANT
DRAIN PENROSE 1/4X12 LTX (DRAIN) ×3 IMPLANT
DRAPE LAPAROTOMY 100X77 ABD (DRAPES) ×3 IMPLANT
DRESSING TELFA 4X3 1S ST N-ADH (GAUZE/BANDAGES/DRESSINGS) ×3 IMPLANT
DRSG TEGADERM 4X4.75 (GAUZE/BANDAGES/DRESSINGS) ×3 IMPLANT
ELECT REM PT RETURN 9FT ADLT (ELECTROSURGICAL) ×3
ELECTRODE REM PT RTRN 9FT ADLT (ELECTROSURGICAL) ×1 IMPLANT
GLOVE BIO SURGEON STRL SZ7.5 (GLOVE) ×6 IMPLANT
GLOVE INDICATOR 8.0 STRL GRN (GLOVE) ×6 IMPLANT
GOWN STRL REUS W/ TWL LRG LVL3 (GOWN DISPOSABLE) ×2 IMPLANT
GOWN STRL REUS W/TWL LRG LVL3 (GOWN DISPOSABLE) ×4
KIT RM TURNOVER STRD PROC AR (KITS) ×3 IMPLANT
LABEL OR SOLS (LABEL) ×3 IMPLANT
MESH HERNIA SYS ULTRAPRO LRG (Mesh General) ×3 IMPLANT
NDL SAFETY 22GX1.5 (NEEDLE) ×6 IMPLANT
NEEDLE HYPO 25X1 1.5 SAFETY (NEEDLE) IMPLANT
PACK BASIN MINOR ARMC (MISCELLANEOUS) ×3 IMPLANT
STRIP CLOSURE SKIN 1/2X4 (GAUZE/BANDAGES/DRESSINGS) ×2 IMPLANT
SUT SURGILON 0 BLK (SUTURE) ×3 IMPLANT
SUT VIC AB 2-0 SH 27 (SUTURE) ×2
SUT VIC AB 2-0 SH 27XBRD (SUTURE) ×1 IMPLANT
SUT VIC AB 3-0 54X BRD REEL (SUTURE) ×1 IMPLANT
SUT VIC AB 3-0 BRD 54 (SUTURE) ×2
SUT VIC AB 3-0 SH 27 (SUTURE) ×2
SUT VIC AB 3-0 SH 27X BRD (SUTURE) ×1 IMPLANT
SUT VIC AB 4-0 FS2 27 (SUTURE) ×3 IMPLANT
SWABSTK COMLB BENZOIN TINCTURE (MISCELLANEOUS) IMPLANT
SYR 3ML LL SCALE MARK (SYRINGE) ×3 IMPLANT
SYR CONTROL 10ML (SYRINGE) ×3 IMPLANT

## 2015-08-09 NOTE — H&P (Signed)
BLAKE QUEENAN [id number] March 15, 1946     HPI: Patient for elective inguinal hernia repair.   Prescriptions prior to admission  Medication Sig Dispense Refill Last Dose  . atorvastatin (LIPITOR) 40 MG tablet Take 1 tablet (40 mg total) by mouth daily. 90 tablet 3 08/09/2015 at 0500  . budesonide-formoterol (SYMBICORT) 80-4.5 MCG/ACT inhaler Inhale 2 puffs into the lungs daily as needed. 3 Inhaler 3 Past Month at Unknown time  . canagliflozin (INVOKANA) 300 MG TABS tablet Take 300 mg by mouth daily before breakfast.   08/08/2015 at Unknown time  . carvedilol (COREG) 12.5 MG tablet Take 1 tablet (12.5 mg total) by mouth 2 (two) times daily. 180 tablet 3 08/09/2015 at 0500  . Glucose Blood (BAYER BREEZE 2 TEST) DISK Check sugar once daily DX: E11.9 100 each 3 08/08/2015 at Unknown time  . Glucose Blood DISK BAYER BREEZE 2 TEST (In Vitro Disk)  1 Disk check sugar twice daily for 0 days  Quantity: 3;  Refills: 3   Ordered :19-May-2014  [name] MD;  Started 19-May-2014 Active Comments: DX: E11.9-QS 90 day supply-   08/08/2015 at Unknown time  . Insulin Aspart Prot & Aspart (NOVOLOG MIX 70/30 [place]) Inject 20 Units into the skin at bedtime as needed (for blood glucose over 200).   08/08/2015 at Unknown time  . levothyroxine (SYNTHROID, LEVOTHROID) 75 MCG tablet Take 75 mcg by mouth daily before breakfast.   08/09/2015 at 0500  . losartan (COZAAR) 100 MG tablet Take 1 tablet (100 mg total) by mouth daily. 90 tablet 3 08/08/2015 at Unknown time  . meclizine (ANTIVERT) 25 MG tablet Take 25 mg by mouth 3 (three) times daily as needed.    Past Month at Unknown time  . nystatin cream (MYCOSTATIN) NYSTATIN, 100000 UNIT/GM (External Cream) - Historical Medication  apply to affected area daily, as needed (100000 UNIT/GM) Active Comments: Medication taken as needed.   Past Month at Unknown time  . nystatin-triamcinolone (MYCOLOG II) cream Apply topically See admin instructions. Use as directed   Past Month at Unknown  time  . triamcinolone cream (KENALOG) 0.1 % See admin instructions. Use as directed   Past Week at Unknown time  . aspirin 81 MG tablet Take 1 mg by mouth daily.    08/05/2015  . pioglitazone (ACTOS) 30 MG tablet Take 1 tablet (30 mg total) by mouth daily. 90 tablet 3 Taking   Allergies  Allergen Reactions  . Ace Inhibitors     Other reaction(s): Unknown  . Sulfa Antibiotics Other (See Comments)    Joint pain  . Penicillins Rash   Past Medical History  Diagnosis Date  . Hypertension   . Hyperlipidemia   . Diabetes mellitus without complication ([place])   . Myocardial infarction ([place]) 1995   Past Surgical History  Procedure Laterality Date  . Cataract extraction Left   . Nasal sinus surgery    . Colonoscopy  2010    [facility]  . Coronary angioplasty with stent placement  1995, 1998, 2000, 2001, 2014   Social History   Social History  . Marital Status: Married    Spouse Name: N/A  . Number of Children: N/A  . Years of Education: N/A   Occupational History  . Not on file.   Social History Main Topics  . Smoking status: Former Smoker    Types: Cigars  . Smokeless tobacco: Former Systems developer  . Alcohol Use: 0.6 oz/week    1 Standard drinks or equivalent per week  . Drug  Use: No  . Sexual Activity: Not on file   Other Topics Concern  . Not on file   Social History Narrative   Social History   Social History Narrative     ROS: Negative.     PE: HEENT: Negative. Lungs: Clear. Cardio: RR.  [name] 08/09/2015   Assessment/Plan:  Proceed with planned right inguinal hernia.

## 2015-08-09 NOTE — Op Note (Signed)
Preoperative diagnosis: Right inguinal hernia.  Postoperative diagnosis: Same.  Operative procedure: Right inguinal hernia repair with large Ultra Pro mesh.  Operating surgeon: Ollen Bowl, M.D.  Anesthesia: Gen. by LMA, Marcaine 0.5% with 1-200,000 epinephrine, 30 mL local infiltration.  Estimate blood loss: Minimal.  Clinical note: This 70 year old male developed a symptomatic left internal hernia. He was amenable for elective repair. He received Kefzol prior to the procedure. Hair was removed with clippers in the preop holding area.  Operative note: With the patient under adequate general anesthesia the area was prepped with ChloraPrep and draped. Marcaine was infiltrated for field block anesthesia. A 5 cm incision along the entire course inguinal canal was carried down through skin and subcutaneous tissue with hemostasis achieved by electrocautery. The external oblique was opened in the direction of its fibers. The cord was dissected free and there was no evidence of an indirect sac. There was however a large direct hernia. This was unroofed. The inferior surface of the fascia was cleared and a large ultra Pro mesh was smoothed into position. The external component was anchored to the pubic tubercle and along the inguinal ligament with interrupted 0 Surgilon sutures. The medial superior corners were anchored to the transverse abdominis aponeurosis in similar fashion. A lateral slit was made for cord passage. The layer of Scarpa's fascia was closed with a running 2-0 Vicryl sutures, skin Scarpa's fascia closed with a running 3-0 Vicryl suture and the skin closed with a running 4-0 subcuticular running suture.  The patient tolerated the procedure well and was brought to the recovery room in stable condition.

## 2015-08-09 NOTE — Discharge Instructions (Signed)

## 2015-08-09 NOTE — Transfer of Care (Signed)
Immediate Anesthesia Transfer of Care Note  Patient: Patrick Mcgee  Procedure(s) Performed: Procedure(s): HERNIA REPAIR INGUINAL ADULT (Right)  Patient Location: PACU  Anesthesia Type:General  Level of Consciousness: sedated  Airway & Oxygen Therapy: Patient Spontanous Breathing and Patient connected to face mask oxygen  Post-op Assessment: Report given to RN and Post -op Vital signs reviewed and stable  Post vital signs: Reviewed and stable  Last Vitals:  Filed Vitals:   08/09/15 0605  BP: 136/98  Pulse: 61  Temp: 36.9 C  Resp: 16    Complications: No apparent anesthesia complications

## 2015-08-09 NOTE — Anesthesia Procedure Notes (Signed)
Procedure Name: LMA Insertion Date/Time: 08/09/2015 7:40 AM Performed by: Johnna Acosta Pre-anesthesia Checklist: Patient identified, Emergency Drugs available, Suction available, Patient being monitored and Timeout performed Patient Re-evaluated:Patient Re-evaluated prior to inductionOxygen Delivery Method: Circle system utilized Preoxygenation: Pre-oxygenation with 100% oxygen Intubation Type: IV induction LMA: LMA inserted LMA Size: 4.5 Tube type: Oral Number of attempts: 1 Placement Confirmation: positive ETCO2 and breath sounds checked- equal and bilateral Tube secured with: Tape

## 2015-08-09 NOTE — Anesthesia Postprocedure Evaluation (Signed)
Anesthesia Post Note  Patient: Patrick Mcgee  Procedure(s) Performed: Procedure(s) (LRB): HERNIA REPAIR INGUINAL ADULT (Right)  Patient location during evaluation: PACU Anesthesia Type: General Level of consciousness: awake and alert Pain management: pain level controlled Vital Signs Assessment: post-procedure vital signs reviewed and stable Respiratory status: spontaneous breathing, nonlabored ventilation, respiratory function stable and patient connected to nasal cannula oxygen Cardiovascular status: blood pressure returned to baseline and stable Postop Assessment: no signs of nausea or vomiting Anesthetic complications: no    Last Vitals:  Filed Vitals:   08/09/15 0605 08/09/15 0841  BP: 136/98 112/75  Pulse: 61 57  Temp: 36.9 C 36.1 C  Resp: 16 15    Last Pain:  Filed Vitals:   08/09/15 0855  PainSc: 1                  Molli Barrows

## 2015-08-09 NOTE — Anesthesia Preprocedure Evaluation (Signed)
Anesthesia Evaluation  Patient identified by MRN, date of birth, ID band Patient awake    Reviewed: Allergy & Precautions, H&P , NPO status , Patient's Chart, lab work & pertinent test results, reviewed documented beta blocker date and time   Airway Mallampati: II   Neck ROM: full    Dental  (+) Poor Dentition   Pulmonary neg pulmonary ROS, asthma , former smoker,    Pulmonary exam normal        Cardiovascular hypertension, + CAD and + Past MI  negative cardio ROS Normal cardiovascular exam     Neuro/Psych negative neurological ROS  negative psych ROS   GI/Hepatic negative GI ROS, Neg liver ROS,   Endo/Other  negative endocrine ROSdiabetesHypothyroidism   Renal/GU negative Renal ROS  negative genitourinary   Musculoskeletal   Abdominal   Peds  Hematology negative hematology ROS (+)   Anesthesia Other Findings Past Medical History:   Hypertension                                                 Hyperlipidemia                                               Diabetes mellitus without complication ([place])                 Myocardial infarction ([place])                     1995       Past Surgical History:   CATARACT EXTRACTION                             Left              NASAL SINUS SURGERY                                           COLONOSCOPY                                      2010           Comment:[facility]   CORONARY ANGIOPLASTY WITH STENT PLACEMENT        1995, 199*   Reproductive/Obstetrics                             Anesthesia Physical Anesthesia Plan  ASA: III  Anesthesia Plan: General   Post-op Pain Management:    Induction:   Airway Management Planned:   Additional Equipment:   Intra-op Plan:   Post-operative Plan:   Informed Consent: I have reviewed the patients History and Physical, chart, labs and discussed the procedure including the risks, benefits and alternatives for  the proposed anesthesia with the patient or authorized representative who has indicated his/her understanding and acceptance.   Dental Advisory Given  Plan Discussed with: CRNA  Anesthesia Plan Comments:         Anesthesia Quick  Evaluation

## 2015-08-16 ENCOUNTER — Ambulatory Visit (INDEPENDENT_AMBULATORY_CARE_PROVIDER_SITE_OTHER): Payer: Medicare Other | Admitting: General Surgery

## 2015-08-16 VITALS — BP 126/80 | HR 74 | Resp 14 | Ht 68.0 in | Wt 201.0 lb

## 2015-08-16 DIAGNOSIS — K4090 Unilateral inguinal hernia, without obstruction or gangrene, not specified as recurrent: Secondary | ICD-10-CM

## 2015-08-16 NOTE — Progress Notes (Signed)
Patient ID: Patrick Mcgee, male   DOB: Nov 15, 1945, 70 y.o.   MRN: [id number]  Chief Complaint  Patient presents with  . Routine Post Op    right inguinal hernia    HPI Patrick Mcgee is a 70 y.o. male here today for post op right inguinal hernia done on 08/09/15. Patient states he is doing well. The patient required minimal narcotics post procedure. HPI  Past Medical History  Diagnosis Date  . Hypertension   . Hyperlipidemia   . Diabetes mellitus without complication ([place])   . Myocardial infarction ([place]) 1995    Past Surgical History  Procedure Laterality Date  . Cataract extraction Left   . Nasal sinus surgery    . Colonoscopy  2010    [facility]  . Coronary angioplasty with stent placement  1995, 1998, 2000, 2001, 2014  . Inguinal hernia repair Right 08/09/2015    Procedure: HERNIA REPAIR INGUINAL ADULT;  Surgeon: [name], MD;  Location: [facility] ORS;  Service: General;  Laterality: Right;    Family History  Problem Relation Age of Onset  . Heart disease Mother   . Cancer Father     Lung and colon cancer  . Heart disease Father   . Emphysema Maternal Grandfather   . Tuberculosis Maternal Grandfather     Social History Social History  Substance Use Topics  . Smoking status: Former Smoker    Types: Cigars  . Smokeless tobacco: Former Systems developer  . Alcohol Use: 0.6 oz/week    1 Standard drinks or equivalent per week    Allergies  Allergen Reactions  . Ace Inhibitors     Other reaction(s): Unknown  . Sulfa Antibiotics Other (See Comments)    Joint pain  . Penicillins Rash    Current Outpatient Prescriptions  Medication Sig Dispense Refill  . aspirin 81 MG tablet Take 1 mg by mouth daily.     Marland Kitchen atorvastatin (LIPITOR) 40 MG tablet Take 1 tablet (40 mg total) by mouth daily. 90 tablet 3  . budesonide-formoterol (SYMBICORT) 80-4.5 MCG/ACT inhaler Inhale 2 puffs into the lungs daily as needed. 3 Inhaler 3  . canagliflozin (INVOKANA) 300 MG TABS tablet Take 300 mg by  mouth daily before breakfast.    . carvedilol (COREG) 12.5 MG tablet Take 1 tablet (12.5 mg total) by mouth 2 (two) times daily. 180 tablet 3  . Glucose Blood (BAYER BREEZE 2 TEST) DISK Check sugar once daily DX: E11.9 100 each 3  . Glucose Blood DISK BAYER BREEZE 2 TEST (In Vitro Disk)  1 Disk check sugar twice daily for 0 days  Quantity: 3;  Refills: 3   Ordered :19-May-2014  [name] MD;  Started 19-May-2014 Active Comments: DX: E11.9-QS 90 day supply-    . HYDROcodone-acetaminophen (NORCO) 5-325 MG tablet Take 1-2 tablets by mouth every 4 (four) hours as needed for moderate pain. 30 tablet 0  . Insulin Aspart Prot & Aspart (NOVOLOG MIX 70/30 [place]) Inject 20 Units into the skin at bedtime as needed (for blood glucose over 200).    Marland Kitchen levothyroxine (SYNTHROID, LEVOTHROID) 75 MCG tablet Take 75 mcg by mouth daily before breakfast.    . losartan (COZAAR) 100 MG tablet Take 1 tablet (100 mg total) by mouth daily. 90 tablet 3  . meclizine (ANTIVERT) 25 MG tablet Take 25 mg by mouth 3 (three) times daily as needed.     . nystatin cream (MYCOSTATIN) NYSTATIN, 100000 UNIT/GM (External Cream) - Historical Medication  apply to affected area daily,  as needed (100000 UNIT/GM) Active Comments: Medication taken as needed.    . nystatin-triamcinolone (MYCOLOG II) cream Apply topically See admin instructions. Use as directed    . pioglitazone (ACTOS) 30 MG tablet Take 1 tablet (30 mg total) by mouth daily. 90 tablet 3  . triamcinolone cream (KENALOG) 0.1 % See admin instructions. Use as directed     No current facility-administered medications for this visit.    Review of Systems Review of Systems  Constitutional: Negative.   Respiratory: Negative.   Cardiovascular: Negative.     Blood pressure 126/80, pulse 74, resp. rate 14, height 5\' 8"  (1.727 m), weight 201 lb (91.173 kg).  Physical Exam Physical Exam  Constitutional: He is oriented to person, place, and time. He appears well-developed and  well-nourished.  Abdominal:  Right inguinal incision is clean and healing well.  Genitourinary:     Neurological: He is alert and oriented to person, place, and time.  Skin: Skin is warm and dry.      Assessment    Doing well status post right inguinal hernia repair with prosthetic mesh.     Plan    The patient will increase his activity as tolerated. Proper lifting technique reviewed.   Patient to return as needed  PCP:  Rosanna Randy  This information has been scribed by Gaspar Cola CMA.    [name] 08/17/2015, 9:26 AM

## 2015-08-16 NOTE — Patient Instructions (Signed)
Patient to return as needed. 

## 2015-08-27 DIAGNOSIS — L308 Other specified dermatitis: Secondary | ICD-10-CM | POA: Diagnosis not present

## 2015-08-27 DIAGNOSIS — R21 Rash and other nonspecific skin eruption: Secondary | ICD-10-CM | POA: Diagnosis not present

## 2015-09-10 DIAGNOSIS — L309 Dermatitis, unspecified: Secondary | ICD-10-CM | POA: Diagnosis not present

## 2015-09-28 ENCOUNTER — Ambulatory Visit (INDEPENDENT_AMBULATORY_CARE_PROVIDER_SITE_OTHER): Payer: Medicare Other | Admitting: Family Medicine

## 2015-09-28 ENCOUNTER — Encounter: Payer: Self-pay | Admitting: Family Medicine

## 2015-09-28 VITALS — BP 108/62 | HR 52 | Temp 98.1°F | Resp 14 | Wt 208.0 lb

## 2015-09-28 DIAGNOSIS — E119 Type 2 diabetes mellitus without complications: Secondary | ICD-10-CM | POA: Diagnosis not present

## 2015-09-28 DIAGNOSIS — I10 Essential (primary) hypertension: Secondary | ICD-10-CM | POA: Diagnosis not present

## 2015-09-28 DIAGNOSIS — E785 Hyperlipidemia, unspecified: Secondary | ICD-10-CM | POA: Diagnosis not present

## 2015-09-28 DIAGNOSIS — Z8719 Personal history of other diseases of the digestive system: Secondary | ICD-10-CM

## 2015-09-28 DIAGNOSIS — Z794 Long term (current) use of insulin: Secondary | ICD-10-CM | POA: Diagnosis not present

## 2015-09-28 DIAGNOSIS — E669 Obesity, unspecified: Secondary | ICD-10-CM | POA: Diagnosis not present

## 2015-09-28 DIAGNOSIS — E038 Other specified hypothyroidism: Secondary | ICD-10-CM

## 2015-09-28 DIAGNOSIS — Z9889 Other specified postprocedural states: Secondary | ICD-10-CM

## 2015-09-28 LAB — POCT GLYCOSYLATED HEMOGLOBIN (HGB A1C): Hemoglobin A1C: 8

## 2015-09-28 LAB — POCT UA - MICROALBUMIN: Microalbumin Ur, POC: 20 mg/L

## 2015-09-28 NOTE — Progress Notes (Signed)
Patient ID: Patrick Mcgee, male   DOB: [birth year], 70 y.o.   MRN: [id number]    Subjective:  HPI  Diabetes follow up: Patient was seen in December and due to frequency of Novolog use and cost stopped Januvia and started Actos. His sugar was not controlled after changes sugar would get as high as 400. He stopped Actos about 2 weeks after starting and just taking Invokana and using Novolog. Highest reading in the morning has been 160, he did have low readings in 30s one night but that was first time in months. He had hernia repair since last visit and was not able to exercise for 3 weeks and knows he has to start back working on his habits. Overall he feels well. No specific complaints despite his frustration with gaining  the weight back. Lab Results  Component Value Date   HGBA1C 7.3 06/28/2015   Hypertension: no cardiac symptoms. BP Readings from Last 3 Encounters:  09/28/15 108/62  08/16/15 126/80  08/09/15 115/76   Hyperlipidemia:  Lab Results  Component Value Date   CHOL 150 03/31/2015   HDL 43 03/31/2015   LDLCALC 88 03/31/2015   TRIG 93 03/31/2015    Prior to Admission medications   Medication Sig Start Date End Date Taking? Authorizing Provider  aspirin 81 MG tablet Take 1 mg by mouth daily.  04/03/13   Historical Provider, MD  atorvastatin (LIPITOR) 40 MG tablet Take 1 tablet (40 mg total) by mouth daily. 04/29/15   Richard Maceo Pro., MD  budesonide-formoterol [facility]) 80-4.5 MCG/ACT inhaler Inhale 2 puffs into the lungs daily as needed. 02/22/15   Richard Maceo Pro., MD  canagliflozin (INVOKANA) 300 MG TABS tablet Take 300 mg by mouth daily before breakfast.    Historical Provider, MD  carvedilol (COREG) 12.5 MG tablet Take 1 tablet (12.5 mg total) by mouth 2 (two) times daily. 04/29/15   Richard Maceo Pro., MD  Glucose Blood (BAYER BREEZE 2 TEST) DISK Check sugar once daily DX: E11.9 07/08/15   Richard Maceo Pro., MD  Glucose Blood DISK BAYER BREEZE 2 TEST  (In Vitro Disk)  1 Disk check sugar twice daily for 0 days  Quantity: 3;  Refills: 3   Ordered :19-May-2014  [name] MD;  Started 19-May-2014 Active Comments: DX: E11.9-QS 90 day supply- 05/19/14   Historical Provider, MD  HYDROcodone-acetaminophen (NORCO) 5-325 MG tablet Take 1-2 tablets by mouth every 4 (four) hours as needed for moderate pain. 08/09/15   [name], MD  Insulin Aspart Prot & Aspart (NOVOLOG MIX 70/30 [place]) Inject 20 Units into the skin at bedtime as needed (for blood glucose over 200).    Historical Provider, MD  levothyroxine (SYNTHROID, LEVOTHROID) 75 MCG tablet Take 75 mcg by mouth daily before breakfast.    Historical Provider, MD  losartan (COZAAR) 100 MG tablet Take 1 tablet (100 mg total) by mouth daily. 04/29/15   Richard Maceo Pro., MD  meclizine (ANTIVERT) 25 MG tablet Take 25 mg by mouth 3 (three) times daily as needed.     Historical Provider, MD  nystatin cream (MYCOSTATIN) NYSTATIN, 100000 UNIT/GM (External Cream) - Historical Medication  apply to affected area daily, as needed (100000 UNIT/GM) Active Comments: Medication taken as needed.    Historical Provider, MD  nystatin-triamcinolone (MYCOLOG II) cream Apply topically See admin instructions. Use as directed 10/07/09   Historical Provider, MD  pioglitazone (ACTOS) 30 MG tablet Take 1 tablet (30 mg total) by mouth daily. 06/28/15  Richard Maceo Pro., MD  triamcinolone cream (KENALOG) 0.1 % See admin instructions. Use as directed 12/03/14   Historical Provider, MD    Patient Active Problem List   Diagnosis Date Noted  . Right inguinal hernia 07/17/2015  . Family history of colon cancer 07/17/2015  . Allergic rhinitis 11/12/2014  . Airway hyperreactivity 11/12/2014  . Atherosclerosis of coronary artery 11/12/2014  . Cheilitis 11/12/2014  . Narrowing of intervertebral disc space 11/12/2014  . Deflected nasal septum 11/12/2014  . HLD (hyperlipidemia) 11/12/2014  . BP (high blood pressure)  11/12/2014  . Adult hypothyroidism 11/12/2014  . Adiposity 11/12/2014  . Diabetes mellitus, type 2 ([place]) 11/12/2014  . Arteriosclerosis of coronary artery 10/02/2012    Past Medical History  Diagnosis Date  . Hypertension   . Hyperlipidemia   . Diabetes mellitus without complication ([place])   . Myocardial infarction [facility]) 1995    Social History   Social History  . Marital Status: Married    Spouse Name: N/A  . Number of Children: N/A  . Years of Education: N/A   Occupational History  . Not on file.   Social History Main Topics  . Smoking status: Former Smoker    Types: Cigars  . Smokeless tobacco: Former Systems developer  . Alcohol Use: 0.6 oz/week    1 Standard drinks or equivalent per week  . Drug Use: No  . Sexual Activity: Not on file   Other Topics Concern  . Not on file   Social History Narrative    Allergies  Allergen Reactions  . Ace Inhibitors     Other reaction(s): Unknown  . Sulfa Antibiotics Other (See Comments)    Joint pain  . Penicillins Rash    Review of Systems  Constitutional: Negative.   Respiratory: Negative.   Cardiovascular: Negative.   Genitourinary: Negative.   Musculoskeletal: Negative.   Neurological: Negative.      There is no immunization history on file for this patient. Objective:  BP 108/62 mmHg  Pulse 52  Temp(Src) 98.1 F (36.7 C)  Resp 14  Wt 208 lb (94.348 kg)  Physical Exam  Constitutional: He is oriented to person, place, and time and well-developed, well-nourished, and in no distress.  HENT:  Head: Normocephalic and atraumatic.  Right Ear: External ear normal.  Left Ear: External ear normal.  Nose: Nose normal.  Eyes: Conjunctivae are normal. Pupils are equal, round, and reactive to light.  Neck: Normal range of motion. Neck supple.  Cardiovascular: Normal rate, regular rhythm, normal heart sounds and intact distal pulses.   No murmur heard. Pulmonary/Chest: Effort normal and breath sounds normal. No respiratory  distress. He has no wheezes.  Musculoskeletal: He exhibits edema (trace). He exhibits no tenderness.  Neurological: He is alert and oriented to person, place, and time.  Skin: Skin is warm and dry.  Psychiatric: Mood, memory, affect and judgment normal.    Lab Results  Component Value Date   WBC 5.6 07/26/2015   HGB 14.9 07/26/2015   HCT 44.7 07/26/2015   PLT 189 07/26/2015   GLUCOSE 192* 07/26/2015   CHOL 150 03/31/2015   TRIG 93 03/31/2015   HDL 43 03/31/2015   LDLCALC 88 03/31/2015   TSH 1.450 03/31/2015   PSA 0.5 09/30/2013   HGBA1C 7.3 06/28/2015    CMP     Component Value Date/Time   NA 138 07/26/2015 1414   NA 140 03/31/2015 0904   NA 137 02/03/2013 2223   K 4.4 07/26/2015 1414  K 4.0 02/03/2013 2223   CL 106 07/26/2015 1414   CL 106 02/03/2013 2223   CO2 26 07/26/2015 1414   CO2 28 02/03/2013 2223   GLUCOSE 192* 07/26/2015 1414   GLUCOSE 122* 03/31/2015 0904   GLUCOSE 158* 02/03/2013 2223   BUN 20 07/26/2015 1414   BUN 15 03/31/2015 0904   BUN 11 02/03/2013 2223   CREATININE 0.73 07/26/2015 1414   CREATININE 0.9 09/30/2013   CREATININE 0.69 02/03/2013 2223   CALCIUM 9.5 07/26/2015 1414   CALCIUM 9.3 02/03/2013 2223   PROT 6.5 03/31/2015 0904   PROT 7.5 02/03/2013 2223   ALBUMIN 4.2 03/31/2015 0904   ALBUMIN 3.3* 02/03/2013 2223   AST 12 03/31/2015 0904   AST 26 02/03/2013 2223   ALT 12 03/31/2015 0904   ALT 30 02/03/2013 2223   ALKPHOS 92 03/31/2015 0904   ALKPHOS 121 02/03/2013 2223   BILITOT 1.4* 03/31/2015 0904   BILITOT 1.1* 02/03/2013 2223   GFRNONAA >60 07/26/2015 1414   GFRNONAA >60 02/03/2013 2223   GFRAA >60 07/26/2015 1414   GFRAA >60 02/03/2013 2223    Assessment and Plan :  1. Type 2 diabetes mellitus without complication, with long-term current use of insulin (HCC) A1C today 8.0. Will let patient get back working on his habits, he has gained 7 lbs since last visit. Will not make changes to the medications at this time. Re check  on the next visit.  2. Essential hypertension Stable.  3. HLD (hyperlipidemia) Stable on the last check.  4. Adiposity Encouraged patient to work on habits, get back to exercising.  5. Status post hernia repair S/p surgery-healing well per patient, no issues,  6. Hypothyroidism Takes Synthroid 75 mcg 1/2 tablet daily, will check levels today. I have done the exam and reviewed the above chart and it is accurate to the best of my knowledge.  Patient was seen and examined by Dr. Eulas Post and note was scribed by Theressa Millard, RMA.    [name] MD [facility] 09/28/2015 3:51 PM

## 2015-12-08 ENCOUNTER — Other Ambulatory Visit: Payer: Self-pay

## 2015-12-08 MED ORDER — GLUCOSE BLOOD VI DISK: DISK | Status: DC

## 2016-01-26 ENCOUNTER — Ambulatory Visit: Payer: Medicare Other | Admitting: Family Medicine

## 2016-02-16 ENCOUNTER — Encounter: Payer: Self-pay | Admitting: Family Medicine

## 2016-02-16 ENCOUNTER — Ambulatory Visit (INDEPENDENT_AMBULATORY_CARE_PROVIDER_SITE_OTHER): Payer: Medicare Other | Admitting: Family Medicine

## 2016-02-16 VITALS — BP 110/70 | HR 64 | Temp 97.5°F | Resp 16 | Wt 209.0 lb

## 2016-02-16 DIAGNOSIS — I10 Essential (primary) hypertension: Secondary | ICD-10-CM

## 2016-02-16 DIAGNOSIS — E785 Hyperlipidemia, unspecified: Secondary | ICD-10-CM | POA: Diagnosis not present

## 2016-02-16 DIAGNOSIS — G4730 Sleep apnea, unspecified: Secondary | ICD-10-CM | POA: Diagnosis not present

## 2016-02-16 DIAGNOSIS — E669 Obesity, unspecified: Secondary | ICD-10-CM

## 2016-02-16 DIAGNOSIS — E119 Type 2 diabetes mellitus without complications: Secondary | ICD-10-CM

## 2016-02-16 DIAGNOSIS — Z794 Long term (current) use of insulin: Secondary | ICD-10-CM | POA: Diagnosis not present

## 2016-02-16 LAB — POCT URINALYSIS DIPSTICK
BILIRUBIN UA: NEGATIVE
GLUCOSE UA: NEGATIVE
Ketones, UA: NEGATIVE
LEUKOCYTES UA: NEGATIVE
NITRITE UA: NEGATIVE
Protein, UA: NEGATIVE
RBC UA: NEGATIVE
Spec Grav, UA: 1.01
UROBILINOGEN UA: 0.2
pH, UA: 5

## 2016-02-16 LAB — POCT GLYCOSYLATED HEMOGLOBIN (HGB A1C): Hemoglobin A1C: 8.3

## 2016-02-16 MED ORDER — CANAGLIFLOZIN 300 MG PO TABS: 300.0000 mg | ORAL_TABLET | Freq: Every day | ORAL | 12 refills | Status: DC

## 2016-02-16 MED ORDER — INSULIN ASPART 100 UNIT/ML ~~LOC~~ SOLN: 20.0000 [IU] | Freq: Every evening | SUBCUTANEOUS | 12 refills | Status: DC | PRN

## 2016-02-16 MED ORDER — GLUCOSE BLOOD VI DISK: DISK | 12 refills | Status: DC

## 2016-02-16 NOTE — Progress Notes (Signed)
Subjective:  HPI  Diabetes Mellitus Type II, Follow-up:   Lab Results  Component Value Date   HGBA1C 8.0 09/28/2015   HGBA1C 7.3 06/28/2015   HGBA1C 7.7 (A) 09/23/2014   Last seen for diabetes 3 months ago.  Management since then includes none. He reports good compliance with treatment. He is not having side effects.  Current symptoms include none and have been unchanged. Home blood sugar records: 87-152 fasting  Episodes of hypoglycemia? About once a day.   Current Insulin Regimen: Novolog at night as needed  Most Recent Eye Exam: more than a year, will make an appt Weight trend: stable Current exercise: walking about 2-3 times a week. He actually says he cannot exercise in recent weeks because of about a plantar fasciitis.  ------------------------------------------------------------------------   Hypertension, follow-up:  BP Readings from Last 3 Encounters:  02/16/16 110/70  09/28/15 108/62  08/16/15 126/80    He was last seen for hypertension 3 months ago.  BP at that visit was 108/62. Management since that visit includes none.He reports good compliance with treatment. He is not having side effects.  He is exercising. He is adherent to low salt diet.   Outside blood pressures are not being checked. He is experiencing none.  Patient denies chest pain, chest pressure/discomfort, claudication, dyspnea, exertional chest pressure/discomfort, fatigue, irregular heart beat, lower extremity edema, near-syncope, orthopnea, palpitations, paroxysmal nocturnal dyspnea and syncope.   Cardiovascular risk factors include advanced age (older than 65 for men, 23 for women), diabetes mellitus, dyslipidemia, hypertension, male gender and smoking/ tobacco exposure.   ------------------------------------------------------------------------    Lipid/Cholesterol, Follow-up:   Last seen for this 3 months ago.  Management since that visit includes none.  Last Lipid Panel:   Component Value Date/Time   CHOL 150 03/31/2015 0904   TRIG 93 03/31/2015 0904   HDL 43 03/31/2015 0904   LDLCALC 88 03/31/2015 0904    He reports good compliance with treatment. He is not having side effects.   Wt Readings from Last 3 Encounters:  02/16/16 209 lb (94.8 kg)  09/28/15 208 lb (94.3 kg)  08/16/15 201 lb (91.2 kg)    ------------------------------------------------------------------------    Prior to Admission medications   Medication Sig Start Date End Date Taking? Authorizing Provider  aspirin 81 MG tablet Take 1 mg by mouth daily.  04/03/13   Historical Provider, MD  atorvastatin (LIPITOR) 40 MG tablet Take 1 tablet (40 mg total) by mouth daily. 04/29/15   Richard Maceo Pro., MD  budesonide-formoterol [facility]) 80-4.5 MCG/ACT inhaler Inhale 2 puffs into the lungs daily as needed. 02/22/15   Richard Maceo Pro., MD  canagliflozin (INVOKANA) 300 MG TABS tablet Take 300 mg by mouth daily before breakfast.    Historical Provider, MD  carvedilol (COREG) 12.5 MG tablet Take 1 tablet (12.5 mg total) by mouth 2 (two) times daily. 04/29/15   Richard Maceo Pro., MD  Glucose Blood (BAYER BREEZE 2 TEST) DISK Check sugar 3 times daily. DX E 11.9-needs bayer breeze 2 test disc 12/08/15   Jerrol Banana., MD  Insulin Aspart Prot & Aspart (NOVOLOG MIX 70/30 [place]) Inject 20 Units into the skin at bedtime as needed (for blood glucose over 200).    Historical Provider, MD  levothyroxine (SYNTHROID, LEVOTHROID) 75 MCG tablet Take 75 mcg by mouth daily before breakfast. Has been taking 1/2 tablet    Historical Provider, MD  losartan (COZAAR) 100 MG tablet Take 1 tablet (100 mg total) by mouth  daily. 04/29/15   Richard Maceo Pro., MD  meclizine (ANTIVERT) 25 MG tablet Take 25 mg by mouth 3 (three) times daily as needed.     Historical Provider, MD  nystatin cream (MYCOSTATIN) NYSTATIN, 100000 UNIT/GM (External Cream) - Historical Medication  apply to affected area daily, as  needed (100000 UNIT/GM) Active Comments: Medication taken as needed.    Historical Provider, MD    Patient Active Problem List   Diagnosis Date Noted  . Right inguinal hernia 07/17/2015  . Family history of colon cancer 07/17/2015  . Allergic rhinitis 11/12/2014  . Airway hyperreactivity 11/12/2014  . Atherosclerosis of coronary artery 11/12/2014  . Cheilitis 11/12/2014  . Narrowing of intervertebral disc space 11/12/2014  . Deflected nasal septum 11/12/2014  . HLD (hyperlipidemia) 11/12/2014  . BP (high blood pressure) 11/12/2014  . Adult hypothyroidism 11/12/2014  . Adiposity 11/12/2014  . Diabetes mellitus, type 2 ([place]) 11/12/2014  . Arteriosclerosis of coronary artery 10/02/2012    Past Medical History:  Diagnosis Date  . Diabetes mellitus without complication ([place])   . Hyperlipidemia   . Hypertension   . Myocardial infarction [facility]) 1995    Social History   Social History  . Marital status: Married    Spouse name: N/A  . Number of children: N/A  . Years of education: N/A   Occupational History  . Not on file.   Social History Main Topics  . Smoking status: Former Smoker    Types: Cigars  . Smokeless tobacco: Former Systems developer  . Alcohol use 0.6 oz/week    1 Standard drinks or equivalent per week  . Drug use: No  . Sexual activity: Not on file   Other Topics Concern  . Not on file   Social History Narrative  . No narrative on file    Allergies  Allergen Reactions  . Ace Inhibitors     Other reaction(s): Unknown  . Sulfa Antibiotics Other (See Comments)    Joint pain  . Penicillins Rash    Review of Systems  Constitutional: Negative.   HENT: Negative.   Eyes: Negative.   Respiratory: Negative.   Cardiovascular: Negative.   Gastrointestinal: Negative.   Genitourinary: Negative.   Musculoskeletal: Negative.   Skin: Negative.   Neurological: Negative.   Endo/Heme/Allergies: Negative.   Psychiatric/Behavioral: Negative.      There is no  immunization history on file for this patient. Objective:  BP 110/70 (BP Location: Left Arm, Patient Position: Sitting, Cuff Size: Normal)   Pulse 64   Temp 97.5 F (36.4 C) (Oral)   Resp 16   Wt 209 lb (94.8 kg)   BMI 31.78 kg/m   Physical Exam  Constitutional: He is oriented to person, place, and time and well-developed, well-nourished, and in no distress.  Eyes: Conjunctivae and EOM are normal. Pupils are equal, round, and reactive to light.  Neck: Normal range of motion. Neck supple.  Cardiovascular: Normal rate, regular rhythm, normal heart sounds and intact distal pulses.   Pulmonary/Chest: Effort normal and breath sounds normal.  Abdominal: Soft. Bowel sounds are normal.  Musculoskeletal: Normal range of motion.  Neurological: He is alert and oriented to person, place, and time. He has normal reflexes. Gait normal. GCS score is 15.  Skin: Skin is warm and dry.  Psychiatric: Mood, memory, affect and judgment normal.    Lab Results  Component Value Date   WBC 5.6 07/26/2015   HGB 14.9 07/26/2015   HCT 44.7 07/26/2015   PLT 189 07/26/2015  GLUCOSE 192 (H) 07/26/2015   CHOL 150 03/31/2015   TRIG 93 03/31/2015   HDL 43 03/31/2015   LDLCALC 88 03/31/2015   TSH 1.450 03/31/2015   PSA 0.5 09/30/2013   HGBA1C 8.0 09/28/2015    CMP     Component Value Date/Time   NA 138 07/26/2015 1414   NA 140 03/31/2015 0904   NA 137 02/03/2013 2223   K 4.4 07/26/2015 1414   K 4.0 02/03/2013 2223   CL 106 07/26/2015 1414   CL 106 02/03/2013 2223   CO2 26 07/26/2015 1414   CO2 28 02/03/2013 2223   GLUCOSE 192 (H) 07/26/2015 1414   GLUCOSE 158 (H) 02/03/2013 2223   BUN 20 07/26/2015 1414   BUN 15 03/31/2015 0904   BUN 11 02/03/2013 2223   CREATININE 0.73 07/26/2015 1414   CREATININE 0.69 02/03/2013 2223   CALCIUM 9.5 07/26/2015 1414   CALCIUM 9.3 02/03/2013 2223   PROT 6.5 03/31/2015 0904   PROT 7.5 02/03/2013 2223   ALBUMIN 4.2 03/31/2015 0904   ALBUMIN 3.3 (L)  02/03/2013 2223   AST 12 03/31/2015 0904   AST 26 02/03/2013 2223   ALT 12 03/31/2015 0904   ALT 30 02/03/2013 2223   ALKPHOS 92 03/31/2015 0904   ALKPHOS 121 02/03/2013 2223   BILITOT 1.4 (H) 03/31/2015 0904   BILITOT 1.1 (H) 02/03/2013 2223   GFRNONAA >60 07/26/2015 1414   GFRNONAA >60 02/03/2013 2223   GFRAA >60 07/26/2015 1414   GFRAA >60 02/03/2013 2223    Assessment and Plan :  1. Type 2 diabetes mellitus without complication, with long-term current use of insulin (HCC)  - POCT HgB A1C - Glucose Blood (BAYER BREEZE 2 TEST) DISK; Check sugar 3 times daily. DX E 11.9-needs bayer breeze 2 test disc  Dispense: 100 each; Refill: 12 - POCT urinalysis dipstick - canagliflozin (INVOKANA) 300 MG TABS tablet; Take 1 tablet (300 mg total) by mouth daily before breakfast.  Dispense: 30 tablet; Refill: 12 - insulin aspart (NOVOLOG) 100 UNIT/ML injection; Inject 20 Units into the skin at bedtime as needed for high blood sugar.  Dispense: 3 vial; Refill: 12  2. Essential hypertension  - CBC with Differential/Platelet - TSH  3. HLD (hyperlipidemia)  - Lipid Panel With LDL/HDL Ratio - Comprehensive metabolic panel  4. Adiposity   5. Sleep apnea It might be patient needs BiPAP as he claims he cannot tolerate the CPAP that he has at home. We'll try CPAP titration with the understanding what may need to do a BiPAP titration. - Cpap titration; Future - Bipap titration; Future 6. CAD  All risk factors treated. I have done the exam and reviewed the above chart and it is accurate to the best of my knowledge.   Patient was seen and examined by Dr. [name], and noted scribed by Webb Laws, [place] MD [facility] 02/16/2016 4:01 PM

## 2016-03-06 DIAGNOSIS — E119 Type 2 diabetes mellitus without complications: Secondary | ICD-10-CM | POA: Diagnosis not present

## 2016-03-07 ENCOUNTER — Encounter: Payer: Self-pay | Admitting: Family Medicine

## 2016-03-07 DIAGNOSIS — E785 Hyperlipidemia, unspecified: Secondary | ICD-10-CM | POA: Diagnosis not present

## 2016-03-07 DIAGNOSIS — I10 Essential (primary) hypertension: Secondary | ICD-10-CM | POA: Diagnosis not present

## 2016-03-08 ENCOUNTER — Encounter: Payer: Self-pay | Admitting: Family Medicine

## 2016-03-08 ENCOUNTER — Ambulatory Visit (INDEPENDENT_AMBULATORY_CARE_PROVIDER_SITE_OTHER): Payer: Medicare Other | Admitting: Family Medicine

## 2016-03-08 VITALS — BP 122/76 | HR 72 | Temp 98.3°F | Resp 16 | Ht 67.0 in | Wt 210.0 lb

## 2016-03-08 DIAGNOSIS — Z0000 Encounter for general adult medical examination without abnormal findings: Secondary | ICD-10-CM

## 2016-03-08 LAB — CBC WITH DIFFERENTIAL/PLATELET
BASOS: 1 %
Basophils Absolute: 0 10*3/uL (ref 0.0–0.2)
EOS (ABSOLUTE): 0.4 10*3/uL (ref 0.0–0.4)
EOS: 6 %
HEMATOCRIT: 43.9 % (ref 37.5–51.0)
Hemoglobin: 14.3 g/dL (ref 12.6–17.7)
IMMATURE GRANS (ABS): 0 10*3/uL (ref 0.0–0.1)
Immature Granulocytes: 0 %
LYMPHS: 23 %
Lymphocytes Absolute: 1.4 10*3/uL (ref 0.7–3.1)
MCH: 30.3 pg (ref 26.6–33.0)
MCHC: 32.6 g/dL (ref 31.5–35.7)
MCV: 93 fL (ref 79–97)
MONOCYTES: 8 %
Monocytes Absolute: 0.5 10*3/uL (ref 0.1–0.9)
NEUTROS ABS: 4 10*3/uL (ref 1.4–7.0)
Neutrophils: 62 %
Platelets: 199 10*3/uL (ref 150–379)
RBC: 4.72 x10E6/uL (ref 4.14–5.80)
RDW: 14 % (ref 12.3–15.4)
WBC: 6.4 10*3/uL (ref 3.4–10.8)

## 2016-03-08 LAB — LIPID PANEL WITH LDL/HDL RATIO
Cholesterol, Total: 130 mg/dL (ref 100–199)
HDL: 51 mg/dL (ref >39)
LDL Calculated: 64 mg/dL (ref 0–99)
LDL/HDL RATIO: 1.3 ratio (ref 0.0–3.6)
TRIGLYCERIDES: 77 mg/dL (ref 0–149)
VLDL CHOLESTEROL CAL: 15 mg/dL (ref 5–40)

## 2016-03-08 LAB — COMPREHENSIVE METABOLIC PANEL
A/G RATIO: 1.8 (ref 1.2–2.2)
ALT: 13 IU/L (ref 0–44)
AST: 15 IU/L (ref 0–40)
Albumin: 4.2 g/dL (ref 3.5–4.8)
Alkaline Phosphatase: 72 IU/L (ref 39–117)
BILIRUBIN TOTAL: 1.1 mg/dL (ref 0.0–1.2)
BUN/Creatinine Ratio: 22 (ref 10–24)
BUN: 16 mg/dL (ref 8–27)
CO2: 26 mmol/L (ref 18–29)
Calcium: 9.8 mg/dL (ref 8.6–10.2)
Chloride: 103 mmol/L (ref 96–106)
Creatinine, Ser: 0.73 mg/dL — ABNORMAL LOW (ref 0.76–1.27)
GFR calc non Af Amer: 93 mL/min/{1.73_m2} (ref >59)
GFR, EST AFRICAN AMERICAN: 108 mL/min/{1.73_m2} (ref >59)
Globulin, Total: 2.3 g/dL (ref 1.5–4.5)
Glucose: 168 mg/dL — ABNORMAL HIGH (ref 65–99)
POTASSIUM: 5.2 mmol/L (ref 3.5–5.2)
Sodium: 140 mmol/L (ref 134–144)
TOTAL PROTEIN: 6.5 g/dL (ref 6.0–8.5)

## 2016-03-08 LAB — TSH: TSH: 4.1 u[IU]/mL (ref 0.450–4.500)

## 2016-03-08 NOTE — Progress Notes (Signed)
Patient: Patrick Mcgee, Male    DOB: 01-12-1946, 70 y.o.   MRN: [id number] Visit Date: 03/08/2016  Today's Provider: Wilhemena Durie, MD   Chief Complaint  Patient presents with  . Medicare Wellness   Subjective:    Annual wellness visit Patrick Mcgee is a 70 y.o. male. He feels well. He reports exercising regularly. He reports he is sleeping well. He is planning to retire as of January 1 of 2018.  -----------------------------------------------------------   Review of Systems  Constitutional: Negative.   HENT: Negative.   Eyes: Negative.   Respiratory: Negative.   Cardiovascular: Negative.   Gastrointestinal: Negative.   Endocrine: Negative.   Genitourinary: Negative.   Musculoskeletal: Negative.   Skin: Negative.   Allergic/Immunologic: Negative.   Neurological: Negative.        He complains of numbness in his toes in the morning.  Hematological: Negative.   Psychiatric/Behavioral: Negative.     Social History   Social History  . Marital status: Married    Spouse name: N/A  . Number of children: N/A  . Years of education: N/A   Occupational History  . Not on file.   Social History Main Topics  . Smoking status: Former Smoker    Types: Cigars  . Smokeless tobacco: Former Systems developer  . Alcohol use 0.6 oz/week    1 Standard drinks or equivalent per week  . Drug use: No  . Sexual activity: Not on file   Other Topics Concern  . Not on file   Social History Narrative  . No narrative on file    Past Medical History:  Diagnosis Date  . Diabetes mellitus without complication ([place])   . Hyperlipidemia   . Hypertension   . Myocardial infarction [facility]) 1995     Patient Active Problem List   Diagnosis Date Noted  . Right inguinal hernia 07/17/2015  . Family history of colon cancer 07/17/2015  . Allergic rhinitis 11/12/2014  . Airway hyperreactivity 11/12/2014  . Atherosclerosis of coronary artery 11/12/2014  . Cheilitis 11/12/2014  .  Narrowing of intervertebral disc space 11/12/2014  . Deflected nasal septum 11/12/2014  . HLD (hyperlipidemia) 11/12/2014  . BP (high blood pressure) 11/12/2014  . Adult hypothyroidism 11/12/2014  . Adiposity 11/12/2014  . Sleep apnea 11/12/2014  . Diabetes mellitus, type 2 ([place]) 11/12/2014  . Arteriosclerosis of coronary artery 10/02/2012    Past Surgical History:  Procedure Laterality Date  . CATARACT EXTRACTION Left   . COLONOSCOPY  2010   [facility]  . [place], 2000, 2001, 2014  . INGUINAL HERNIA REPAIR Right 08/09/2015   Procedure: HERNIA REPAIR INGUINAL ADULT;  Surgeon: [name], MD;  Location: [facility] ORS;  Service: General;  Laterality: Right;  . NASAL SINUS SURGERY      His family history includes Cancer in his father; Emphysema in his maternal grandfather; Heart disease in his father and mother; Tuberculosis in his maternal grandfather.    Current Meds  Medication Sig  . aspirin 81 MG tablet Take 1 mg by mouth daily.   Marland Kitchen atorvastatin (LIPITOR) 40 MG tablet Take 1 tablet (40 mg total) by mouth daily.  . budesonide-formoterol (SYMBICORT) 80-4.5 MCG/ACT inhaler Inhale 2 puffs into the lungs daily as needed.  . canagliflozin (INVOKANA) 300 MG TABS tablet Take 1 tablet (300 mg total) by mouth daily before breakfast.  . carvedilol (COREG) 12.5 MG tablet Take 1 tablet (12.5 mg total) by  mouth 2 (two) times daily.  . Glucose Blood (BAYER BREEZE 2 TEST) DISK Check sugar 3 times daily. DX E 11.9-needs bayer breeze 2 test disc  . Insulin Aspart Prot & Aspart (NOVOLOG MIX 70/30 [place]) Inject 20 Units into the skin at bedtime as needed (for blood glucose over 200).  Marland Kitchen levothyroxine (SYNTHROID, LEVOTHROID) 75 MCG tablet Take 75 mcg by mouth daily before breakfast. Has been taking 1/2 tablet  . losartan (COZAAR) 100 MG tablet Take 1 tablet (100 mg total) by mouth daily.  . meclizine (ANTIVERT) 25 MG tablet Take 25 mg by mouth 3 (three) times  daily as needed.   . nystatin cream (MYCOSTATIN) NYSTATIN, 100000 UNIT/GM (External Cream) - Historical Medication  apply to affected area daily, as needed (100000 UNIT/GM) Active Comments: Medication taken as needed.  . [DISCONTINUED] insulin aspart (NOVOLOG) 100 UNIT/ML injection Inject 20 Units into the skin at bedtime as needed for high blood sugar.    Patient Care Team: Jerrol Banana., MD as PCP - General (Family Medicine) Jerrol Banana., MD (Family Medicine) [name], MD (General Surgery)    Objective:   Vitals: BP 122/76 (BP Location: Right Arm, Patient Position: Sitting, Cuff Size: Normal)   Pulse 72   Temp 98.3 F (36.8 C) (Oral)   Resp 16   Ht 5\' 7"  (1.702 m)   Wt 210 lb (95.3 kg)   BMI 32.89 kg/m   Physical Exam  Constitutional: He is oriented to person, place, and time. He appears well-developed and well-nourished.  Pleasant, cooperative, mildly obese white male in no acute distress  HENT:  Head: Normocephalic.  Right Ear: External ear normal.  Left Ear: External ear normal.  Nose: Nose normal.  Mouth/Throat: Oropharynx is clear and moist.  Eyes: Conjunctivae are normal. No scleral icterus.  Neck: Neck supple. No thyromegaly present.  Cardiovascular: Normal rate, regular rhythm, normal heart sounds and intact distal pulses.   Pulmonary/Chest: Effort normal and breath sounds normal.  Abdominal: Soft.  Genitourinary: Rectum normal, prostate normal and penis normal.  Musculoskeletal: He exhibits no edema.  Lymphadenopathy:    He has no cervical adenopathy.  Neurological: He is alert and oriented to person, place, and time. No cranial nerve deficit. He exhibits normal muscle tone. Coordination normal.  Fine monofilament exam of the feet is normal today  Skin: Skin is warm and dry.  Psychiatric: He has a normal mood and affect. His behavior is normal. Judgment and thought content normal.    Activities of Daily Living In your present state  of health, do you have any difficulty performing the following activities: 03/08/2016 07/26/2015  Hearing? N N  Vision? N -  Difficulty concentrating or making decisions? N N  Walking or climbing stairs? N Y  Dressing or bathing? N N  Doing errands, shopping? N N  Some recent data might be hidden    Fall Risk Assessment Fall Risk  03/08/2016 03/29/2015  Falls in the past year? No No     Depression Screen PHQ 2/9 Scores 03/08/2016 03/29/2015  PHQ - 2 Score 0 0    Cognitive Testing - 6-CIT  Correct? Score   What year is it? yes 0 0 or 4  What month is it? yes 0 0 or 3  Memorize:    Pia Mau,  40,  [place],      What time is it? (within 1 hour) yes 0 0 or 3  Count backwards from 20 no 2  0, 2, or 4  Name the months of the year yes 0 0, 2, or 4  Repeat name & address above yes 0 0, 2, 4, 6, 8, or 10       TOTAL SCORE  2/28   Interpretation:  Normal  Normal (0-7) Abnormal (8-28)       Assessment & Plan:     Annual Wellness Visit  Reviewed patient's Family Medical History Reviewed and updated list of patient's medical providers Assessment of cognitive impairment was done Assessed patient's functional ability Established a written schedule for health screening [place] Completed and Reviewed  Exercise Activities and Dietary recommendations Goals    None       There is no immunization history on file for this patient.  Health Maintenance  Topic Date Due  . Hepatitis C Screening  1945/10/06  . FOOT EXAM  04/29/1954  . TETANUS/TDAP  04/30/1963  . ZOSTAVAX  04/29/2004  . PNA vac Low Risk Adult (1 of 2 - PCV13) 04/29/2009  . INFLUENZA VACCINE  03/28/2016 (Originally 02/08/2016)  . HEMOGLOBIN A1C  08/18/2016  . OPHTHALMOLOGY EXAM  03/06/2017  . COLONOSCOPY  02/04/2018      Discussed health benefits of physical activity, and encouraged him to engage in regular exercise appropriate for his age and condition.      ------------------------------------------------------------------------------------------------------------   I have done the exam and reviewed the above chart and it is accurate to the best of my knowledge.  Cheri Ayotte Cranford Mon, MD  [facility]

## 2016-04-07 ENCOUNTER — Other Ambulatory Visit: Payer: Self-pay | Admitting: Family Medicine

## 2016-04-10 ENCOUNTER — Other Ambulatory Visit: Payer: Self-pay

## 2016-04-10 MED ORDER — INSULIN ASPART 100 UNIT/ML FLEXPEN: PEN_INJECTOR | SUBCUTANEOUS | 11 refills | Status: DC

## 2016-04-11 ENCOUNTER — Other Ambulatory Visit: Payer: Self-pay

## 2016-04-11 MED ORDER — ATORVASTATIN CALCIUM 40 MG PO TABS: 40.0000 mg | ORAL_TABLET | Freq: Every day | ORAL | 3 refills | Status: DC

## 2016-04-13 ENCOUNTER — Other Ambulatory Visit: Payer: Self-pay

## 2016-04-13 MED ORDER — INSULIN ASPART PROT & ASPART (70-30 MIX) 100 UNIT/ML PEN: PEN_INJECTOR | SUBCUTANEOUS | 3 refills | Status: DC

## 2016-04-13 MED ORDER — INSULIN ASPART PROT & ASPART (70-30 MIX) 100 UNIT/ML ~~LOC~~ SUSP: SUBCUTANEOUS | 11 refills | Status: DC

## 2016-04-13 NOTE — Telephone Encounter (Signed)
Patrick Mcgee, CMA called pharmacy and verified that they received the Novolog FlexPen, and not the vial.

## 2016-05-12 ENCOUNTER — Other Ambulatory Visit: Payer: Self-pay

## 2016-05-12 MED ORDER — LOSARTAN POTASSIUM 100 MG PO TABS: 100.0000 mg | ORAL_TABLET | Freq: Every day | ORAL | 3 refills | Status: DC

## 2016-05-15 ENCOUNTER — Other Ambulatory Visit: Payer: Self-pay

## 2016-05-15 DIAGNOSIS — Z794 Long term (current) use of insulin: Principal | ICD-10-CM

## 2016-05-15 DIAGNOSIS — E119 Type 2 diabetes mellitus without complications: Secondary | ICD-10-CM

## 2016-05-15 MED ORDER — CANAGLIFLOZIN 300 MG PO TABS: 300.0000 mg | ORAL_TABLET | Freq: Every day | ORAL | 3 refills | Status: DC

## 2016-05-19 ENCOUNTER — Telehealth: Payer: Self-pay | Admitting: Family Medicine

## 2016-05-19 DIAGNOSIS — Z1211 Encounter for screening for malignant neoplasm of colon: Secondary | ICD-10-CM

## 2016-05-19 NOTE — Telephone Encounter (Signed)
ok 

## 2016-05-19 NOTE — Telephone Encounter (Signed)
Please review. KW 

## 2016-05-19 NOTE — Telephone Encounter (Signed)
Order put in-aa 

## 2016-05-19 NOTE — Telephone Encounter (Signed)
Pt is requesting referral to Dr Vira Agar for colonoscopy.Pt states it has been years since last colonoscopy and he has family history (Dad) of colon cancer

## 2016-05-29 ENCOUNTER — Ambulatory Visit: Payer: Medicare Other | Attending: Otolaryngology

## 2016-05-29 DIAGNOSIS — G4700 Insomnia, unspecified: Secondary | ICD-10-CM | POA: Insufficient documentation

## 2016-05-29 DIAGNOSIS — R0683 Snoring: Secondary | ICD-10-CM | POA: Insufficient documentation

## 2016-05-29 DIAGNOSIS — G4733 Obstructive sleep apnea (adult) (pediatric): Secondary | ICD-10-CM | POA: Insufficient documentation

## 2016-06-07 ENCOUNTER — Encounter: Payer: Self-pay | Admitting: Family Medicine

## 2016-06-20 ENCOUNTER — Ambulatory Visit (INDEPENDENT_AMBULATORY_CARE_PROVIDER_SITE_OTHER): Payer: Medicare Other | Admitting: Physician Assistant

## 2016-06-20 DIAGNOSIS — Z23 Encounter for immunization: Secondary | ICD-10-CM

## 2016-06-22 ENCOUNTER — Ambulatory Visit: Payer: Medicare Other | Attending: Otolaryngology

## 2016-06-22 DIAGNOSIS — G2581 Restless legs syndrome: Secondary | ICD-10-CM | POA: Diagnosis not present

## 2016-06-22 DIAGNOSIS — G4733 Obstructive sleep apnea (adult) (pediatric): Secondary | ICD-10-CM | POA: Insufficient documentation

## 2016-06-22 DIAGNOSIS — R0683 Snoring: Secondary | ICD-10-CM | POA: Diagnosis not present

## 2016-07-11 ENCOUNTER — Other Ambulatory Visit: Payer: Self-pay | Admitting: Family Medicine

## 2016-07-12 ENCOUNTER — Other Ambulatory Visit: Payer: Self-pay

## 2016-07-12 MED ORDER — GLUCOSE BLOOD VI DISK: DISK | 3 refills | Status: DC

## 2016-07-19 ENCOUNTER — Telehealth: Payer: Self-pay | Admitting: Family Medicine

## 2016-07-19 NOTE — Telephone Encounter (Signed)
ok 

## 2016-07-19 NOTE — Telephone Encounter (Signed)
CPAP order faxed to [facility].

## 2016-07-19 NOTE — Telephone Encounter (Signed)
Pt is requesting authorization/Rx so he can have the pressure on his old cpap machine set for the same setting as the new machine he will be getting next week.   Pt states he will take the old machine with him when he travels. CB#[phone]/MW

## 2016-07-28 DIAGNOSIS — Z800 Family history of malignant neoplasm of digestive organs: Secondary | ICD-10-CM | POA: Diagnosis not present

## 2016-07-28 DIAGNOSIS — Z86010 Personal history of colonic polyps: Secondary | ICD-10-CM | POA: Diagnosis not present

## 2016-08-16 ENCOUNTER — Other Ambulatory Visit: Payer: Self-pay | Admitting: Family Medicine

## 2016-08-16 MED ORDER — GLUCOSE BLOOD VI DISK: DISK | 12 refills | Status: DC

## 2016-08-16 NOTE — Telephone Encounter (Signed)
1. Pt stated that his test strips that go with his glucose meter are no longer made and pt would like a new Rx sent to [facility] for a new glucose meter and test strips that Medicare will cover. 2. Pt also stated that the test strips have only been written for him to check his sugar once a day and he runs out to soon b/c he test his sugar 3 times a day. Please advise. Thanks TNP

## 2016-08-16 NOTE — Telephone Encounter (Signed)
Spoke with patient and he will check and see what meter and strips are covered and will [place] know-aa

## 2016-10-04 ENCOUNTER — Ambulatory Visit: Payer: Medicare Other | Admitting: Certified Registered Nurse Anesthetist

## 2016-10-04 ENCOUNTER — Encounter
Admission: RE | Disposition: A | Discharge status: Home / Self Care | Payer: Self-pay | Source: Ambulatory Visit | Attending: Unknown Physician Specialty

## 2016-10-04 ENCOUNTER — Encounter: Payer: Self-pay | Admitting: Certified Registered Nurse Anesthetist

## 2016-10-04 ENCOUNTER — Ambulatory Visit
Admission: RE | Admit: 2016-10-04 | Discharge: 2016-10-04 | Disposition: A | Discharge status: Home / Self Care | Payer: Medicare Other | Source: Ambulatory Visit | Attending: Unknown Physician Specialty | Admitting: Unknown Physician Specialty

## 2016-10-04 DIAGNOSIS — Z79899 Other long term (current) drug therapy: Secondary | ICD-10-CM | POA: Insufficient documentation

## 2016-10-04 DIAGNOSIS — K5730 Diverticulosis of large intestine without perforation or abscess without bleeding: Secondary | ICD-10-CM | POA: Insufficient documentation

## 2016-10-04 DIAGNOSIS — Z7982 Long term (current) use of aspirin: Secondary | ICD-10-CM | POA: Insufficient documentation

## 2016-10-04 DIAGNOSIS — Z86010 Personal history of colonic polyps: Secondary | ICD-10-CM | POA: Insufficient documentation

## 2016-10-04 DIAGNOSIS — Z1211 Encounter for screening for malignant neoplasm of colon: Secondary | ICD-10-CM | POA: Insufficient documentation

## 2016-10-04 DIAGNOSIS — Z87891 Personal history of nicotine dependence: Secondary | ICD-10-CM | POA: Insufficient documentation

## 2016-10-04 DIAGNOSIS — K640 First degree hemorrhoids: Secondary | ICD-10-CM | POA: Diagnosis not present

## 2016-10-04 DIAGNOSIS — I2510 Atherosclerotic heart disease of native coronary artery without angina pectoris: Secondary | ICD-10-CM | POA: Insufficient documentation

## 2016-10-04 DIAGNOSIS — Z800 Family history of malignant neoplasm of digestive organs: Secondary | ICD-10-CM | POA: Insufficient documentation

## 2016-10-04 DIAGNOSIS — G4730 Sleep apnea, unspecified: Secondary | ICD-10-CM | POA: Diagnosis not present

## 2016-10-04 DIAGNOSIS — E119 Type 2 diabetes mellitus without complications: Secondary | ICD-10-CM | POA: Diagnosis not present

## 2016-10-04 DIAGNOSIS — Z955 Presence of coronary angioplasty implant and graft: Secondary | ICD-10-CM | POA: Insufficient documentation

## 2016-10-04 DIAGNOSIS — E039 Hypothyroidism, unspecified: Secondary | ICD-10-CM | POA: Diagnosis not present

## 2016-10-04 DIAGNOSIS — K648 Other hemorrhoids: Secondary | ICD-10-CM | POA: Diagnosis not present

## 2016-10-04 DIAGNOSIS — I10 Essential (primary) hypertension: Secondary | ICD-10-CM | POA: Diagnosis not present

## 2016-10-04 DIAGNOSIS — I252 Old myocardial infarction: Secondary | ICD-10-CM | POA: Insufficient documentation

## 2016-10-04 DIAGNOSIS — J45909 Unspecified asthma, uncomplicated: Secondary | ICD-10-CM | POA: Diagnosis not present

## 2016-10-04 DIAGNOSIS — K5790 Diverticulosis of intestine, part unspecified, without perforation or abscess without bleeding: Secondary | ICD-10-CM | POA: Diagnosis not present

## 2016-10-04 DIAGNOSIS — E785 Hyperlipidemia, unspecified: Secondary | ICD-10-CM | POA: Diagnosis not present

## 2016-10-04 HISTORY — PX: COLONOSCOPY WITH PROPOFOL: SHX5780

## 2016-10-04 LAB — GLUCOSE, CAPILLARY: Glucose-Capillary: 87 mg/dL (ref 65–99)

## 2016-10-04 SURGERY — COLONOSCOPY WITH PROPOFOL
Anesthesia: General

## 2016-10-04 MED ORDER — PROPOFOL 10 MG/ML IV BOLUS
INTRAVENOUS | Status: DC | PRN
  Administered 2016-10-04: 30 mg via INTRAVENOUS

## 2016-10-04 MED ORDER — PROPOFOL 500 MG/50ML IV EMUL
INTRAVENOUS | Status: DC | PRN
  Administered 2016-10-04: 140 ug/kg/min via INTRAVENOUS

## 2016-10-04 MED ORDER — MIDAZOLAM HCL 2 MG/2ML IJ SOLN
INTRAMUSCULAR | Status: DC | PRN
Start: 2016-10-04 — End: 2016-10-04
  Administered 2016-10-04: 2 mg via INTRAVENOUS

## 2016-10-04 MED ORDER — PHENYLEPHRINE HCL 10 MG/ML IJ SOLN
INTRAMUSCULAR | Status: DC | PRN
  Administered 2016-10-04: 100 ug via INTRAVENOUS

## 2016-10-04 MED ORDER — LIDOCAINE HCL (PF) 2 % IJ SOLN
INTRAMUSCULAR | Status: AC
Start: 2016-10-04 — End: 2016-10-04
  Filled 2016-10-04: qty 2

## 2016-10-04 MED ORDER — SODIUM CHLORIDE 0.9 % IV SOLN
INTRAVENOUS | Status: DC
  Administered 2016-10-04: 1000 mL via INTRAVENOUS

## 2016-10-04 MED ORDER — SODIUM CHLORIDE 0.9 % IV SOLN: INTRAVENOUS | Status: DC

## 2016-10-04 MED ORDER — MIDAZOLAM HCL 2 MG/2ML IJ SOLN
INTRAMUSCULAR | Status: AC
  Filled 2016-10-04: qty 2

## 2016-10-04 MED ORDER — LIDOCAINE HCL (CARDIAC) 20 MG/ML IV SOLN
INTRAVENOUS | Status: DC | PRN
  Administered 2016-10-04: 60 mg via INTRAVENOUS

## 2016-10-04 MED ORDER — PROPOFOL 500 MG/50ML IV EMUL
INTRAVENOUS | Status: AC
  Filled 2016-10-04: qty 50

## 2016-10-04 NOTE — Anesthesia Preprocedure Evaluation (Signed)
Anesthesia Evaluation  Patient identified by MRN, date of birth, ID band Patient awake    Reviewed: Allergy & Precautions, H&P , NPO status , Patient's Chart, lab work & pertinent test results, reviewed documented beta blocker date and time   Airway Mallampati: II   Neck ROM: full    Dental  (+) Poor Dentition   Pulmonary neg pulmonary ROS, asthma , sleep apnea , former smoker,    Pulmonary exam normal        Cardiovascular hypertension, + CAD and + Past MI  negative cardio ROS Normal cardiovascular exam Rhythm:regular Rate:Normal     Neuro/Psych negative neurological ROS  negative psych ROS   GI/Hepatic negative GI ROS, Neg liver ROS,   Endo/Other  negative endocrine ROSdiabetesHypothyroidism   Renal/GU negative Renal ROS  negative genitourinary   Musculoskeletal   Abdominal   Peds  Hematology negative hematology ROS (+)   Anesthesia Other Findings Past Medical History: No date: Diabetes mellitus without complication ([place]) No date: Hyperlipidemia No date: Hypertension 1995: Myocardial infarction Past Surgical History: No date: CATARACT EXTRACTION Left 2010: COLONOSCOPY     Comment: [facility] 1995, 1998, 2000, 2001, 2014: CORONARY ANGIOPLASTY WITH STENT PLACEMENT 08/09/2015: INGUINAL HERNIA REPAIR Right     Comment: Procedure: HERNIA REPAIR INGUINAL ADULT;                Surgeon: [name], MD;  Location: [facility]              ORS;  Service: General;  Laterality: Right; No date: NASAL SINUS SURGERY BMI    Body Mass Index:  31.17 kg/m     Reproductive/Obstetrics negative OB ROS                             Anesthesia Physical Anesthesia Plan  ASA: III  Anesthesia Plan: General   Post-op Pain Management:    Induction:   Airway Management Planned:   Additional Equipment:   Intra-op Plan:   Post-operative Plan:   Informed Consent: I have reviewed the patients History  and Physical, chart, labs and discussed the procedure including the risks, benefits and alternatives for the proposed anesthesia with the patient or authorized representative who has indicated his/her understanding and acceptance.   Dental Advisory Given  Plan Discussed with: CRNA  Anesthesia Plan Comments:         Anesthesia Quick Evaluation

## 2016-10-04 NOTE — H&P (Deleted)
Primary Care Physician:  Wilhemena Durie, MD Primary Gastroenterologist:  Dr. Vira Agar  Pre-Procedure History & Physical: HPI:  Patrick Mcgee is a 71 y.o. male is here for an colonoscopy.  He has hx of sleep apnea and IDDM, did Trilyte prep   Past Medical History:  Diagnosis Date  . Diabetes mellitus without complication ([place])   . Hyperlipidemia   . Hypertension   . Myocardial infarction 1995    Past Surgical History:  Procedure Laterality Date  . CATARACT EXTRACTION Left   . COLONOSCOPY  2010   [facility]  . [place], 2000, 2001, 2014  . INGUINAL HERNIA REPAIR Right 08/09/2015   Procedure: HERNIA REPAIR INGUINAL ADULT;  Surgeon: Cyntia Staley Bellow, MD;  Location: [facility] ORS;  Service: General;  Laterality: Right;  . NASAL SINUS SURGERY      Prior to Admission medications   Medication Sig Start Date End Date Taking? Authorizing Provider  aspirin 81 MG tablet Take 1 mg by mouth daily.  04/03/13  Yes Historical Provider, MD  atorvastatin (LIPITOR) 40 MG tablet Take 1 tablet (40 mg total) by mouth daily. 04/11/16  Yes Richard Maceo Pro., MD  budesonide-formoterol [facility]) 80-4.5 MCG/ACT inhaler Inhale 2 puffs into the lungs daily as needed. 02/22/15  Yes Richard Maceo Pro., MD  canagliflozin [facility]) 300 MG TABS tablet Take 1 tablet (300 mg total) by mouth daily before breakfast. 05/15/16  Yes Jerrol Banana., MD  carvedilol (COREG) 12.5 MG tablet TAKE ONE TABLET BY MOUTH TWICE DAILY 07/11/16  Yes Richard Maceo Pro., MD  Glucose Blood (BAYER BREEZE 2 TEST) DISK Check sugar 3 times daily. DX E 11.9-needs bayer breeze 2 test disc 02/16/16  Yes Richard Maceo Pro., MD  Glucose Blood (BAYER BREEZE 2 TEST) DISK Check sugar three times daily DX E11.9 08/16/16  Yes Richard Maceo Pro., MD  insulin aspart protamine - aspart (NOVOLOG MIX 70/30 FLEXPEN) (70-30) 100 UNIT/ML FlexPen Inject 20 units twice daily as needed SQ. DX E11.9 04/13/16   Yes Richard Maceo Pro., MD  levothyroxine (SYNTHROID, LEVOTHROID) 75 MCG tablet Take 75 mcg by mouth daily before breakfast. Has been taking 1/2 tablet   Yes Historical Provider, MD  losartan (COZAAR) 100 MG tablet Take 1 tablet (100 mg total) by mouth daily. 05/12/16  Yes Richard Maceo Pro., MD  meclizine (ANTIVERT) 25 MG tablet Take 25 mg by mouth 3 (three) times daily as needed.    Yes Historical Provider, MD  nystatin cream (MYCOSTATIN) APPLY  CREAM TOPICALLY TWICE DAILY AROUND  THE  GROIN  AREA 04/10/16  Yes Jerrol Banana., MD    Allergies as of 08/29/2016 - Review Complete 03/08/2016  Allergen Reaction Noted  . Ace inhibitors  12/22/2014  . Sulfa antibiotics Other (See Comments) 11/12/2014  . Penicillins Rash 11/12/2014    Family History  Problem Relation Age of Onset  . Heart disease Mother   . Cancer Father     Lung and colon cancer  . Heart disease Father   . Emphysema Maternal Grandfather   . Tuberculosis Maternal Grandfather     Social History   Social History  . Marital status: Married    Spouse name: N/A  . Number of children: N/A  . Years of education: N/A   Occupational History  . Not on file.   Social History Main Topics  . Smoking status: Former Smoker    Types: Cigars  .  Smokeless tobacco: Former Systems developer  . Alcohol use 0.6 oz/week    1 Standard drinks or equivalent per week  . Drug use: No  . Sexual activity: Not on file   Other Topics Concern  . Not on file   Social History Narrative  . No narrative on file    Review of Systems: See HPI, otherwise negative ROS  Physical Exam: BP 137/88   Pulse 81   Temp 97.1 F (36.2 C) (Tympanic)   Resp 18   Ht 5\' 8"  (1.727 m)   Wt 93 kg (205 lb)   SpO2 100%   BMI 31.17 kg/m  General:   Alert,  pleasant and cooperative in NAD Head:  Normocephalic and atraumatic. Neck:  Supple; no masses or thyromegaly. Lungs:  Clear throughout to auscultation.    Heart:  Regular rate and  rhythm. Abdomen:  Soft, nontender and nondistended. Normal bowel sounds, without guarding, and without rebound.   Neurologic:  Alert and  oriented x4;  grossly normal neurologically.  Impression/Plan: Patrick Mcgee is here for an colonoscopy to be performed for screening  Risks, benefits, limitations, and alternatives regarding  colonoscopy have been reviewed with the patient.  Questions have been answered.  All parties agreeable.   Gaylyn Cheers, MD  10/04/2016, 7:28 AM

## 2016-10-04 NOTE — Anesthesia Post-op Follow-up Note (Cosign Needed)
Anesthesia QCDR form completed.        

## 2016-10-04 NOTE — Op Note (Signed)
[facility] Patient Name: Patrick Mcgee Procedure Date: 10/04/2016 7:38 AM MRN: [id number] Account #: [id number] Date of Birth: 26-Feb-1946 Admit Type: Outpatient Age: 71 Room: [facility] ENDO ROOM 4 Gender: Male Note Status: Finalized Procedure:            Colonoscopy Indications:          High risk colon cancer surveillance: Personal history                        of colonic polyps Providers:            [name], MD Referring MD:         Janine Ores. Rosanna Randy, MD (Referring MD) Medicines:            Propofol per Anesthesia Complications:        No immediate complications. Procedure:            Pre-Anesthesia Assessment:                       - After reviewing the risks and benefits, the patient                        was deemed in satisfactory condition to undergo the                        procedure.                       After obtaining informed consent, the colonoscope was                        passed under direct vision. Throughout the procedure,                        the patient's blood pressure, pulse, and oxygen                        saturations were monitored continuously. The                        Colonoscope was introduced through the anus and                        advanced to the the cecum, identified by appendiceal                        orifice and ileocecal valve. The colonoscopy was                        performed without difficulty. The patient tolerated the                        procedure well. The quality of the bowel preparation                        was good. Findings:      Multiple small-medium-mouthed diverticula were found in the sigmoid       colon.      Internal hemorrhoids were found during endoscopy. The hemorrhoids were       small and Grade I (internal hemorrhoids that do not prolapse).  The exam was otherwise without abnormality. Impression:           - Diverticulosis in the sigmoid colon.            - Internal hemorrhoids.                       - The examination was otherwise normal.                       - No specimens collected. Recommendation:       - Repeat colonoscopy in 5 years for surveillance. [name], MD 10/04/2016 8:01:09 AM This report has been signed electronically. Number of Addenda: 0 Note Initiated On: 10/04/2016 7:38 AM Scope Withdrawal Time: 0 hours 8 minutes 44 seconds  Total Procedure Duration: 0 hours 14 minutes 21 seconds       [facility]

## 2016-10-04 NOTE — Anesthesia Postprocedure Evaluation (Signed)
Anesthesia Post Note  Patient: MARSTON MCCADDEN  Procedure(s) Performed: Procedure(s) (LRB): COLONOSCOPY WITH PROPOFOL (N/A)  Patient location during evaluation: PACU Anesthesia Type: General Level of consciousness: awake and alert Pain management: pain level controlled Vital Signs Assessment: post-procedure vital signs reviewed and stable Respiratory status: spontaneous breathing, nonlabored ventilation, respiratory function stable and patient connected to nasal cannula oxygen Cardiovascular status: blood pressure returned to baseline and stable Postop Assessment: no signs of nausea or vomiting Anesthetic complications: no     Last Vitals:  Vitals:   10/04/16 0830 10/04/16 0840  BP: 100/73 113/78  Pulse: 66 65  Resp: 18 14  Temp:      Last Pain:  Vitals:   10/04/16 0805  TempSrc: Tympanic                 Molli Barrows

## 2016-10-04 NOTE — Transfer of Care (Signed)
Immediate Anesthesia Transfer of Care Note  Patient: Patrick Mcgee  Procedure(s) Performed: Procedure(s): COLONOSCOPY WITH PROPOFOL (N/A)  Patient Location: PACU  Anesthesia Type:General  Level of Consciousness: awake  Airway & Oxygen Therapy: Patient Spontanous Breathing and Patient connected to nasal cannula oxygen  Post-op Assessment: Report given to RN and Post -op Vital signs reviewed and stable  Post vital signs: Reviewed and stable  Last Vitals:  Vitals:   10/04/16 0800 10/04/16 0805  BP: (P) 94/62 (!) 84/61  Pulse: (P) 69 67  Resp: (P) 18 18  Temp: (!) (P) 35.7 C 36.1 C    Last Pain:  Vitals:   10/04/16 0805  TempSrc: Tympanic         Complications: No apparent anesthesia complications

## 2016-10-04 NOTE — H&P (Signed)
Primary Care Physician:  Wilhemena Durie, MD Primary Gastroenterologist:  Dr. Vira Agar  Pre-Procedure History & Physical: HPI:  MISHAEL HARAN is a 71 y.o. male is here for an colonoscopy.   Past Medical History:  Diagnosis Date  . Diabetes mellitus without complication ([place])   . Hyperlipidemia   . Hypertension   . Myocardial infarction 1995    Past Surgical History:  Procedure Laterality Date  . CATARACT EXTRACTION Left   . COLONOSCOPY  2010   [facility]  . [place], 2000, 2001, 2014  . INGUINAL HERNIA REPAIR Right 08/09/2015   Procedure: HERNIA REPAIR INGUINAL ADULT;  Surgeon: [name], MD;  Location: [facility] ORS;  Service: General;  Laterality: Right;  . NASAL SINUS SURGERY      Prior to Admission medications   Medication Sig Start Date End Date Taking? Authorizing Provider  aspirin 81 MG tablet Take 1 mg by mouth daily.  04/03/13  Yes Historical Provider, MD  atorvastatin (LIPITOR) 40 MG tablet Take 1 tablet (40 mg total) by mouth daily. 04/11/16  Yes Richard Maceo Pro., MD  budesonide-formoterol [facility]) 80-4.5 MCG/ACT inhaler Inhale 2 puffs into the lungs daily as needed. 02/22/15  Yes Richard Maceo Pro., MD  canagliflozin [facility]) 300 MG TABS tablet Take 1 tablet (300 mg total) by mouth daily before breakfast. 05/15/16  Yes Jerrol Banana., MD  carvedilol (COREG) 12.5 MG tablet TAKE ONE TABLET BY MOUTH TWICE DAILY 07/11/16  Yes Richard Maceo Pro., MD  Glucose Blood (BAYER BREEZE 2 TEST) DISK Check sugar 3 times daily. DX E 11.9-needs bayer breeze 2 test disc 02/16/16  Yes Richard Maceo Pro., MD  Glucose Blood (BAYER BREEZE 2 TEST) DISK Check sugar three times daily DX E11.9 08/16/16  Yes Richard Maceo Pro., MD  insulin aspart protamine - aspart (NOVOLOG MIX 70/30 FLEXPEN) (70-30) 100 UNIT/ML FlexPen Inject 20 units twice daily as needed SQ. DX E11.9 04/13/16  Yes Richard Maceo Pro., MD  levothyroxine  (SYNTHROID, LEVOTHROID) 75 MCG tablet Take 75 mcg by mouth daily before breakfast. Has been taking 1/2 tablet   Yes Historical Provider, MD  losartan (COZAAR) 100 MG tablet Take 1 tablet (100 mg total) by mouth daily. 05/12/16  Yes Richard Maceo Pro., MD  meclizine (ANTIVERT) 25 MG tablet Take 25 mg by mouth 3 (three) times daily as needed.    Yes Historical Provider, MD  nystatin cream (MYCOSTATIN) APPLY  CREAM TOPICALLY TWICE DAILY AROUND  THE  GROIN  AREA 04/10/16  Yes Jerrol Banana., MD    Allergies as of 08/29/2016 - Review Complete 03/08/2016  Allergen Reaction Noted  . Ace inhibitors  12/22/2014  . Sulfa antibiotics Other (See Comments) 11/12/2014  . Penicillins Rash 11/12/2014    Family History  Problem Relation Age of Onset  . Heart disease Mother   . Cancer Father     Lung and colon cancer  . Heart disease Father   . Emphysema Maternal Grandfather   . Tuberculosis Maternal Grandfather     Social History   Social History  . Marital status: Married    Spouse name: N/A  . Number of children: N/A  . Years of education: N/A   Occupational History  . Not on file.   Social History Main Topics  . Smoking status: Former Smoker    Types: Cigars  . Smokeless tobacco: Former Systems developer  . Alcohol use 0.6 oz/week  1 Standard drinks or equivalent per week  . Drug use: No  . Sexual activity: Not on file   Other Topics Concern  . Not on file   Social History Narrative  . No narrative on file    Review of Systems: See HPI, otherwise negative ROS  Physical Exam: BP 137/88   Pulse 81   Temp 97.1 F (36.2 C) (Tympanic)   Resp 18   Ht 5\' 8"  (1.727 m)   Wt 93 kg (205 lb)   SpO2 100%   BMI 31.17 kg/m  General:   Alert,  pleasant and cooperative in NAD Head:  Normocephalic and atraumatic. Neck:  Supple; no masses or thyromegaly. Lungs:  Clear throughout to auscultation.    Heart:  Regular rate and rhythm. Abdomen:  Soft, nontender and nondistended. Normal  bowel sounds, without guarding, and without rebound.   Neurologic:  Alert and  oriented x4;  grossly normal neurologically.  Impression/Plan: [name] is here for an colonoscopy to be performed for [facility] colon polyps and FH colon cancer.  Risks, benefits, limitations, and alternatives regarding  colonoscopy have been reviewed with the patient.  Questions have been answered.  All parties agreeable.   Gaylyn Cheers, MD  10/04/2016, 7:26 AM

## 2016-10-04 NOTE — Anesthesia Procedure Notes (Signed)
Date/Time: 10/04/2016 7:42 AM Performed by: Johnna Acosta Pre-anesthesia Checklist: Patient identified, Emergency Drugs available, Suction available, Patient being monitored and Timeout performed Patient Re-evaluated:Patient Re-evaluated prior to inductionOxygen Delivery Method: Nasal cannula

## 2016-10-05 ENCOUNTER — Encounter: Payer: Self-pay | Admitting: Unknown Physician Specialty

## 2016-10-05 ENCOUNTER — Telehealth: Payer: Self-pay

## 2016-10-05 ENCOUNTER — Ambulatory Visit (INDEPENDENT_AMBULATORY_CARE_PROVIDER_SITE_OTHER): Payer: Medicare Other | Admitting: Family Medicine

## 2016-10-05 VITALS — BP 104/82 | HR 62 | Temp 98.7°F | Resp 12 | Wt 211.0 lb

## 2016-10-05 DIAGNOSIS — E119 Type 2 diabetes mellitus without complications: Secondary | ICD-10-CM

## 2016-10-05 DIAGNOSIS — I10 Essential (primary) hypertension: Secondary | ICD-10-CM | POA: Diagnosis not present

## 2016-10-05 DIAGNOSIS — E784 Other hyperlipidemia: Secondary | ICD-10-CM | POA: Diagnosis not present

## 2016-10-05 DIAGNOSIS — G4733 Obstructive sleep apnea (adult) (pediatric): Secondary | ICD-10-CM

## 2016-10-05 DIAGNOSIS — Z794 Long term (current) use of insulin: Secondary | ICD-10-CM

## 2016-10-05 DIAGNOSIS — E7849 Other hyperlipidemia: Secondary | ICD-10-CM

## 2016-10-05 LAB — POCT GLYCOSYLATED HEMOGLOBIN (HGB A1C): Hemoglobin A1C: 8.8

## 2016-10-05 LAB — POCT UA - MICROALBUMIN: Microalbumin Ur, POC: 20 mg/L

## 2016-10-05 MED ORDER — METFORMIN HCL 500 MG PO TABS: 500.0000 mg | ORAL_TABLET | Freq: Every day | ORAL | 12 refills | Status: DC

## 2016-10-05 NOTE — Telephone Encounter (Signed)
Dr Rosanna Randy let me know when you are finished with this patient's office note and I will get it faxed over to CPAP company. Thank you-aa

## 2016-10-05 NOTE — Progress Notes (Signed)
Patrick Mcgee  MRN: [id number] DOB: Oct 11, 1945  Subjective:  HPI  Patient is here for follow up on CPAP. He has been using CPAP machine for sleep apnea for years and recently received a new CPAP machine. He is using it at least 5 hours every night. Tolerating it well. Symptoms are controlled with use of CPAP, he use to gasp for air at night, snore and had extreme fatigue from not been rested. He feels great now since using CPAP. Measurement of CPAP is set at 10.  Also wanted to discuss changing Invokana to something else. He states this medication gives him pain his legs and it is expensive when he gets in a donut hole. He is using Novolog mix 70/30 20 units once daily right now too. Past medication for this include Actos, Januvia. He does get some low sugars generally in the morning and reading is in 50s. Lab Results  Component Value Date   HGBA1C 8.3 02/16/2016    Patient Active Problem List   Diagnosis Date Noted  . Right inguinal hernia 07/17/2015  . Family history of colon cancer 07/17/2015  . Allergic rhinitis 11/12/2014  . Airway hyperreactivity 11/12/2014  . Atherosclerosis of coronary artery 11/12/2014  . Cheilitis 11/12/2014  . Narrowing of intervertebral disc space 11/12/2014  . Deflected nasal septum 11/12/2014  . HLD (hyperlipidemia) 11/12/2014  . BP (high blood pressure) 11/12/2014  . Adult hypothyroidism 11/12/2014  . Adiposity 11/12/2014  . Sleep apnea 11/12/2014  . Diabetes mellitus, type 2 ([place]) 11/12/2014  . Arteriosclerosis of coronary artery 10/02/2012    Past Medical History:  Diagnosis Date  . Diabetes mellitus without complication ([place])   . Hyperlipidemia   . Hypertension   . Myocardial infarction 1995    Social History   Social History  . Marital status: Married    Spouse name: N/A  . Number of children: N/A  . Years of education: N/A   Occupational History  . Not on file.   Social History Main Topics  . Smoking status: Former Smoker     Types: Cigars  . Smokeless tobacco: Former Systems developer  . Alcohol use 0.6 oz/week    1 Standard drinks or equivalent per week  . Drug use: No  . Sexual activity: Not on file   Other Topics Concern  . Not on file   Social History Narrative  . No narrative on file    Outpatient Encounter Prescriptions as of 10/05/2016  Medication Sig Note  . aspirin 81 MG tablet Take 1 mg by mouth daily.  11/12/2014: Received from: [facility]  . atorvastatin (LIPITOR) 40 MG tablet Take 1 tablet (40 mg total) by mouth daily.   . canagliflozin (INVOKANA) 300 MG TABS tablet Take 1 tablet (300 mg total) by mouth daily before breakfast.   . carvedilol (COREG) 12.5 MG tablet TAKE ONE TABLET BY MOUTH TWICE DAILY   . Glucose Blood (BAYER BREEZE 2 TEST) DISK Check sugar 3 times daily. DX E 11.9-needs bayer breeze 2 test disc   . Glucose Blood (BAYER BREEZE 2 TEST) DISK Check sugar three times daily DX E11.9   . insulin aspart protamine - aspart (NOVOLOG MIX 70/30 FLEXPEN) (70-30) 100 UNIT/ML FlexPen Inject 20 units twice daily as needed SQ. DX E11.9   . losartan (COZAAR) 100 MG tablet Take 1 tablet (100 mg total) by mouth daily.   . NON FORMULARY CPAP machine every night-measurement 10   . nystatin cream (MYCOSTATIN) APPLY  CREAM TOPICALLY TWICE  DAILY AROUND  THE  GROIN  AREA   . [DISCONTINUED] budesonide-formoterol (SYMBICORT) 80-4.5 MCG/ACT inhaler Inhale 2 puffs into the lungs daily as needed.   . [DISCONTINUED] levothyroxine (SYNTHROID, LEVOTHROID) 75 MCG tablet Take 75 mcg by mouth daily before breakfast. Has been taking 1/2 tablet   . [DISCONTINUED] meclizine (ANTIVERT) 25 MG tablet Take 25 mg by mouth 3 (three) times daily as needed.  11/12/2014: Medication taken as needed.  Received from: [facility]   No facility-administered encounter medications on file as of 10/05/2016.     Allergies  Allergen Reactions  . Ace Inhibitors     Other reaction(s): Unknown  . Atorvastatin    . Sulfa Antibiotics Other (See Comments)    Joint pain  . Penicillins Rash    Review of Systems  Constitutional: Negative.   Respiratory: Negative.   Cardiovascular: Negative.   Gastrointestinal: Negative.   Musculoskeletal: Positive for joint pain and myalgias (legs).    Objective:  BP 104/82   Pulse 62   Temp 98.7 F (37.1 C)   Resp 12   Wt 211 lb (95.7 kg)   BMI 32.08 kg/m   Physical Exam  Constitutional: He is oriented to person, place, and time and well-developed, well-nourished, and in no distress.  HENT:  Head: Normocephalic and atraumatic.  Eyes: Conjunctivae are normal. Pupils are equal, round, and reactive to light.  Neck: Normal range of motion. Neck supple.  Cardiovascular: Normal rate, regular rhythm, normal heart sounds and intact distal pulses.   No murmur heard. Pulmonary/Chest: Effort normal and breath sounds normal. No respiratory distress. He has no wheezes.  Musculoskeletal: He exhibits no edema or tenderness.  Neurological: He is alert and oriented to person, place, and time.  Psychiatric: Mood, memory, affect and judgment normal.   Assessment and Plan :  1. Obstructive sleep apnea syndrome Tolerating CPAP machine well. Symptoms are controlled on CPAP. Using CPAP machine at least 5 hours at night. Continue using CPAP machine.  2. Type 2 diabetes mellitus without complication, with long-term current use of insulin (HCC) A1C 8.8 worse. New directions for insulin as following Novolog mix 70/30 take 5 units in the morning and 20 units in the evening. Add Metformin. Stop Invokana. - POCT UA - Microalbumin - POCT HgB A1C  3. Other hyperlipidemia  HPI, Exam and A&P transcribed under direction and in the presence of [name], MD.   4. Essential hypertension 5.CAD All risk factors treated.  I have done the exam and reviewed the chart and it is accurate to the best of my knowledge. Development worker, community has been used and  any errors in dictation  or transcription are unintentional. [name] M.D. [facility]

## 2016-10-06 NOTE — Telephone Encounter (Signed)
Done

## 2016-10-16 ENCOUNTER — Telehealth: Payer: Self-pay | Admitting: Family Medicine

## 2016-10-16 ENCOUNTER — Other Ambulatory Visit: Payer: Self-pay

## 2016-10-16 MED ORDER — METFORMIN HCL 500 MG PO TABS: 500.0000 mg | ORAL_TABLET | Freq: Two times a day (BID) | ORAL | 12 refills | Status: DC

## 2016-10-16 NOTE — Telephone Encounter (Signed)
Advised and new Rx sent to pharmacy ED

## 2016-10-16 NOTE — Telephone Encounter (Signed)
Yes--then to 1000 BID if not coming down some.

## 2016-10-16 NOTE — Telephone Encounter (Signed)
Pt called saying since he changed to metformin last week his blood sugars have been high in the evenings.  Pleae advise.  Thanks [employer]

## 2016-10-16 NOTE — Telephone Encounter (Signed)
When patient was on Invokana the highest his glucose had been was 200.  Since changing to Metformin (due to cost) his glucose in the morning and afternoon is good (about 150's) but in the evening it is 300+ with a reading of 426 one time.  He is only taking the Metformin 500 mg q am and wants to know if he can go to BID. It is of note that he has controlled the level some by increasing his insulin Thanks ED

## 2016-10-24 DIAGNOSIS — E113291 Type 2 diabetes mellitus with mild nonproliferative diabetic retinopathy without macular edema, right eye: Secondary | ICD-10-CM | POA: Diagnosis not present

## 2016-10-24 LAB — HM DIABETES EYE EXAM

## 2016-11-08 ENCOUNTER — Ambulatory Visit (INDEPENDENT_AMBULATORY_CARE_PROVIDER_SITE_OTHER): Payer: Medicare Other | Admitting: Family Medicine

## 2016-11-08 ENCOUNTER — Encounter: Payer: Self-pay | Admitting: Family Medicine

## 2016-11-08 VITALS — BP 122/60 | HR 60 | Temp 97.8°F | Resp 16 | Wt 210.0 lb

## 2016-11-08 DIAGNOSIS — I10 Essential (primary) hypertension: Secondary | ICD-10-CM

## 2016-11-08 DIAGNOSIS — E784 Other hyperlipidemia: Secondary | ICD-10-CM

## 2016-11-08 DIAGNOSIS — E7849 Other hyperlipidemia: Secondary | ICD-10-CM

## 2016-11-08 DIAGNOSIS — Z794 Long term (current) use of insulin: Secondary | ICD-10-CM | POA: Diagnosis not present

## 2016-11-08 DIAGNOSIS — E119 Type 2 diabetes mellitus without complications: Secondary | ICD-10-CM | POA: Diagnosis not present

## 2016-11-08 NOTE — Progress Notes (Signed)
Subjective:  HPI  Diabetes Mellitus Type II, Follow-up:   Lab Results  Component Value Date   HGBA1C 8.8 10/05/2016   HGBA1C 8.3 02/16/2016   HGBA1C 8.0 09/28/2015    Last seen for diabetes 6 weeks ago.  Management since then includes add metformin and stopped Invokana. He reports good compliance with treatment. He is not having side effects.  Home blood sugar records: highest 260 and lowest in the mornings150  Episodes of hypoglycemia? yes - sometimes but only because of his eating schedule.   Current Insulin Regimen: Novolog 18-20 sometimes 25 units Current diet: diabetic Current exercise: daily walking   Pertinent Labs:    Component Value Date/Time   CHOL 130 03/07/2016 1048   TRIG 77 03/07/2016 1048   HDL 51 03/07/2016 1048   LDLCALC 64 03/07/2016 1048   CREATININE 0.73 (L) 03/07/2016 1048   CREATININE 0.69 02/03/2013 2223    Wt Readings from Last 3 Encounters:  11/08/16 210 lb (95.3 kg)  10/05/16 211 lb (95.7 kg)  10/04/16 205 lb (93 kg)    ------------------------------------------------------------------------ Pt reports that he is feeling well and the leg pain has resolved since stopping the invokana.   Prior to Admission medications   Medication Sig Start Date End Date Taking? Authorizing Provider  aspirin 81 MG tablet Take 1 mg by mouth daily.  04/03/13   Historical Provider, MD  atorvastatin (LIPITOR) 40 MG tablet Take 1 tablet (40 mg total) by mouth daily. 04/11/16   Richard Maceo Pro., MD  carvedilol (COREG) 12.5 MG tablet TAKE ONE TABLET BY MOUTH TWICE DAILY 07/11/16   Jerrol Banana., MD  Glucose Blood (BAYER BREEZE 2 TEST) DISK Check sugar 3 times daily. DX E 11.9-needs bayer breeze 2 test disc 02/16/16   Jerrol Banana., MD  Glucose Blood (BAYER BREEZE 2 TEST) DISK Check sugar three times daily DX E11.9 08/16/16   Jerrol Banana., MD  insulin aspart protamine - aspart (NOVOLOG MIX 70/30 FLEXPEN) (70-30) 100 UNIT/ML FlexPen Inject  20 units twice daily as needed SQ. DX E11.9 04/13/16   Richard Maceo Pro., MD  losartan (COZAAR) 100 MG tablet Take 1 tablet (100 mg total) by mouth daily. 05/12/16   Richard Maceo Pro., MD  metFORMIN (GLUCOPHAGE) 500 MG tablet Take 1 tablet (500 mg total) by mouth 2 (two) times daily with a meal. 10/16/16   Richard Maceo Pro., MD  NON FORMULARY CPAP machine every night-measurement 10    Historical Provider, MD  nystatin cream (MYCOSTATIN) APPLY  CREAM TOPICALLY TWICE DAILY AROUND  THE  GROIN  AREA 04/10/16   Jerrol Banana., MD    Patient Active Problem List   Diagnosis Date Noted  . Right inguinal hernia 07/17/2015  . Family history of colon cancer 07/17/2015  . Allergic rhinitis 11/12/2014  . Airway hyperreactivity 11/12/2014  . Atherosclerosis of coronary artery 11/12/2014  . Cheilitis 11/12/2014  . Narrowing of intervertebral disc space 11/12/2014  . Deflected nasal septum 11/12/2014  . HLD (hyperlipidemia) 11/12/2014  . BP (high blood pressure) 11/12/2014  . Adult hypothyroidism 11/12/2014  . Adiposity 11/12/2014  . Sleep apnea 11/12/2014  . Diabetes mellitus, type 2 ([place]) 11/12/2014  . Arteriosclerosis of coronary artery 10/02/2012  . Degenerative disc disease, lumbar 09/24/2012  . Furunculosis 04/23/2012    Past Medical History:  Diagnosis Date  . Diabetes mellitus without complication ([place])   . Hyperlipidemia   . Hypertension   . Myocardial  infarction [facility]) 1995    Social History   Social History  . Marital status: Married    Spouse name: N/A  . Number of children: N/A  . Years of education: N/A   Occupational History  . Not on file.   Social History Main Topics  . Smoking status: Former Smoker    Types: Cigars  . Smokeless tobacco: Former Systems developer  . Alcohol use 0.6 oz/week    1 Standard drinks or equivalent per week  . Drug use: No  . Sexual activity: Not on file   Other Topics Concern  . Not on file   Social History Narrative  . No  narrative on file    Allergies  Allergen Reactions  . Ace Inhibitors     Other reaction(s): Unknown  . Atorvastatin   . Invokana [Canagliflozin]     Leg pain  . Sulfa Antibiotics Other (See Comments)    Joint pain  . Penicillins Rash    Review of Systems  Constitutional: Negative.   HENT: Negative.   Eyes: Negative.   Respiratory: Negative.   Cardiovascular: Negative.   Gastrointestinal: Negative.   Genitourinary: Negative.   Musculoskeletal: Negative.   Skin: Negative.   Neurological: Negative.   Endo/Heme/Allergies: Negative.   Psychiatric/Behavioral: Negative.     Immunization History  Administered Date(s) Administered  . Influenza, High Dose Seasonal PF 06/20/2016  . Pneumococcal Conjugate-13 01/29/2014  . Pneumococcal Polysaccharide-23 09/06/2011  . Tdap 08/27/2007  . Zoster 10/26/2010    Objective:  BP 122/60 (BP Location: Left Arm, Patient Position: Sitting, Cuff Size: Large)   Pulse 60   Temp 97.8 F (36.6 C) (Oral)   Resp 16   Wt 210 lb (95.3 kg)   BMI 31.93 kg/m   Physical Exam  Constitutional: He is oriented to person, place, and time and well-developed, well-nourished, and in no distress.  HENT:  Head: Normocephalic and atraumatic.  Eyes: Conjunctivae and EOM are normal. Pupils are equal, round, and reactive to light.  Neck: Normal range of motion. Neck supple.  Cardiovascular: Normal rate, regular rhythm, normal heart sounds and intact distal pulses.   Pulmonary/Chest: Effort normal and breath sounds normal.  Abdominal: Soft. Bowel sounds are normal.  Musculoskeletal: Normal range of motion.  Neurological: He is alert and oriented to person, place, and time. He has normal reflexes. Gait normal. GCS score is 15.  Skin: Skin is warm and dry.  Psychiatric: Mood, memory, affect and judgment normal.    Lab Results  Component Value Date   WBC 6.4 03/07/2016   HGB 14.9 07/26/2015   HCT 43.9 03/07/2016   PLT 199 03/07/2016   GLUCOSE 168 (H)  03/07/2016   CHOL 130 03/07/2016   TRIG 77 03/07/2016   HDL 51 03/07/2016   LDLCALC 64 03/07/2016   TSH 4.100 03/07/2016   PSA 0.5 09/30/2013   HGBA1C 8.8 10/05/2016   MICROALBUR 20 10/05/2016    CMP     Component Value Date/Time   NA 140 03/07/2016 1048   NA 137 02/03/2013 2223   K 5.2 03/07/2016 1048   K 4.0 02/03/2013 2223   CL 103 03/07/2016 1048   CL 106 02/03/2013 2223   CO2 26 03/07/2016 1048   CO2 28 02/03/2013 2223   GLUCOSE 168 (H) 03/07/2016 1048   GLUCOSE 192 (H) 07/26/2015 1414   GLUCOSE 158 (H) 02/03/2013 2223   BUN 16 03/07/2016 1048   BUN 11 02/03/2013 2223   CREATININE 0.73 (L) 03/07/2016 1048   CREATININE  0.69 02/03/2013 2223   CALCIUM 9.8 03/07/2016 1048   CALCIUM 9.3 02/03/2013 2223   PROT 6.5 03/07/2016 1048   PROT 7.5 02/03/2013 2223   ALBUMIN 4.2 03/07/2016 1048   ALBUMIN 3.3 (L) 02/03/2013 2223   AST 15 03/07/2016 1048   AST 26 02/03/2013 2223   ALT 13 03/07/2016 1048   ALT 30 02/03/2013 2223   ALKPHOS 72 03/07/2016 1048   ALKPHOS 121 02/03/2013 2223   BILITOT 1.1 03/07/2016 1048   BILITOT 1.1 (H) 02/03/2013 2223   GFRNONAA 93 03/07/2016 1048   GFRNONAA >60 02/03/2013 2223   GFRAA 108 03/07/2016 1048   GFRAA >60 02/03/2013 2223    Assessment and Plan :  1. Type 2 diabetes mellitus without complication, with long-term current use of insulin (HCC) Improving on metformin. Too early for A1c will follow up in 2-3 months and recheck a1c   2. Essential hypertension Stable.  3. Other hyperlipidemia    HPI, Exam, and A&P Transcribed under the direction and in the presence of Richard L. Cranford Mon, MD  Electronically Signed: Katina Dung, CMA I have done the exam and reviewed the above chart and it is accurate to the best of my knowledge. Development worker, community has been used in this note in any air is in the dictation or transcription are unintentional.  [facility] 11/08/2016  3:36 PM

## 2016-11-15 DIAGNOSIS — L718 Other rosacea: Secondary | ICD-10-CM | POA: Diagnosis not present

## 2016-11-21 ENCOUNTER — Other Ambulatory Visit: Payer: Self-pay

## 2016-11-21 MED ORDER — INSULIN ASPART PROT & ASPART (70-30 MIX) 100 UNIT/ML PEN: PEN_INJECTOR | SUBCUTANEOUS | 3 refills | Status: DC

## 2016-11-25 ENCOUNTER — Other Ambulatory Visit: Payer: Self-pay | Admitting: Family Medicine

## 2016-12-05 ENCOUNTER — Telehealth: Payer: Self-pay | Admitting: Family Medicine

## 2016-12-05 NOTE — Telephone Encounter (Signed)
Pt advised. Pt will come and look at the one we have which is contour next one and see if this is covered with his insurance-aa

## 2016-12-05 NOTE — Telephone Encounter (Signed)
Pt called wanting to know if we have the Contour test kit available.  He said he got one from [place] a while back and needs a new one. His call back is   [phone]  Thanks [employer]

## 2017-01-01 DIAGNOSIS — L249 Irritant contact dermatitis, unspecified cause: Secondary | ICD-10-CM | POA: Diagnosis not present

## 2017-01-01 DIAGNOSIS — L718 Other rosacea: Secondary | ICD-10-CM | POA: Diagnosis not present

## 2017-02-07 DIAGNOSIS — R079 Chest pain, unspecified: Secondary | ICD-10-CM | POA: Diagnosis not present

## 2017-02-07 DIAGNOSIS — I2510 Atherosclerotic heart disease of native coronary artery without angina pectoris: Secondary | ICD-10-CM | POA: Diagnosis not present

## 2017-02-07 DIAGNOSIS — I10 Essential (primary) hypertension: Secondary | ICD-10-CM | POA: Diagnosis not present

## 2017-02-08 ENCOUNTER — Ambulatory Visit: Payer: Medicare Other | Admitting: Family Medicine

## 2017-02-20 DIAGNOSIS — I2510 Atherosclerotic heart disease of native coronary artery without angina pectoris: Secondary | ICD-10-CM | POA: Diagnosis not present

## 2017-02-20 DIAGNOSIS — R079 Chest pain, unspecified: Secondary | ICD-10-CM | POA: Diagnosis not present

## 2017-02-22 ENCOUNTER — Telehealth: Payer: Self-pay | Admitting: Family Medicine

## 2017-02-22 MED ORDER — INSULIN PEN NEEDLE 31G X 5 MM MISC: 3 refills | Status: DC

## 2017-02-22 NOTE — Telephone Encounter (Signed)
Done-aa 

## 2017-02-22 NOTE — Telephone Encounter (Signed)
Pt needs to order some needles BD Ultra fine mini. [id number].  He uses [employer]

## 2017-03-13 ENCOUNTER — Ambulatory Visit (INDEPENDENT_AMBULATORY_CARE_PROVIDER_SITE_OTHER): Payer: Medicare Other | Admitting: Family Medicine

## 2017-03-13 ENCOUNTER — Ambulatory Visit (INDEPENDENT_AMBULATORY_CARE_PROVIDER_SITE_OTHER): Payer: Medicare Other

## 2017-03-13 VITALS — BP 142/78 | HR 60 | Temp 99.3°F | Resp 16 | Wt 203.0 lb

## 2017-03-13 VITALS — BP 142/78 | HR 60 | Temp 99.3°F | Ht 68.0 in | Wt 203.8 lb

## 2017-03-13 DIAGNOSIS — E039 Hypothyroidism, unspecified: Secondary | ICD-10-CM

## 2017-03-13 DIAGNOSIS — E784 Other hyperlipidemia: Secondary | ICD-10-CM

## 2017-03-13 DIAGNOSIS — Z0000 Encounter for general adult medical examination without abnormal findings: Secondary | ICD-10-CM | POA: Diagnosis not present

## 2017-03-13 DIAGNOSIS — Z23 Encounter for immunization: Secondary | ICD-10-CM | POA: Diagnosis not present

## 2017-03-13 DIAGNOSIS — E7849 Other hyperlipidemia: Secondary | ICD-10-CM

## 2017-03-13 DIAGNOSIS — E119 Type 2 diabetes mellitus without complications: Secondary | ICD-10-CM | POA: Diagnosis not present

## 2017-03-13 DIAGNOSIS — I10 Essential (primary) hypertension: Secondary | ICD-10-CM

## 2017-03-13 MED ORDER — METFORMIN HCL 1000 MG PO TABS: 500.0000 mg | ORAL_TABLET | Freq: Two times a day (BID) | ORAL | 3 refills | Status: DC

## 2017-03-13 NOTE — Progress Notes (Signed)
Subjective:   Patrick Mcgee is a 71 y.o. male who presents for Medicare Annual/Subsequent preventive examination.  Review of Systems:  N/A  Cardiac Risk Factors include: advanced age (>74men, >26 women);diabetes mellitus;dyslipidemia;hypertension;obesity (BMI >30kg/m2)     Objective:    Vitals: BP (!) 142/78 (BP Location: Left Arm)   Pulse 60   Temp 99.3 F (37.4 C) (Oral)   Ht 5\' 8"  (1.727 m)   Wt 203 lb 12.8 oz (92.4 kg)   BMI 30.99 kg/m   Body mass index is 30.99 kg/m.  Tobacco History  Smoking Status  . Former Smoker  . Types: Cigars  Smokeless Tobacco  . Former Engineer, structural given: Not Answered   Past Medical History:  Diagnosis Date  . Diabetes mellitus without complication ([place])   . Hyperlipidemia   . Hypertension   . Myocardial infarction ([place]) 1995   Past Surgical History:  Procedure Laterality Date  . CATARACT EXTRACTION Left   . COLONOSCOPY  2010   [facility]  . COLONOSCOPY WITH PROPOFOL N/A 10/04/2016   Procedure: COLONOSCOPY WITH PROPOFOL;  Surgeon: [name], MD;  Location: [facility] ENDOSCOPY;  Service: Endoscopy;  Laterality: N/A;  . [place], 2000, 2001, 2014  . INGUINAL HERNIA REPAIR Right 08/09/2015   Procedure: HERNIA REPAIR INGUINAL ADULT;  Surgeon: [name], MD;  Location: [facility] ORS;  Service: General;  Laterality: Right;  . NASAL SINUS SURGERY    . nuclear stress test     Family History  Problem Relation Age of Onset  . Heart disease Mother   . Cancer Father        Lung and colon cancer  . Heart disease Father   . Emphysema Maternal Grandfather   . Tuberculosis Maternal Grandfather    History  Sexual Activity  . Sexual activity: Not on file    Outpatient Encounter Prescriptions as of 03/13/2017  Medication Sig  . aspirin 81 MG tablet Take 1 mg by mouth daily.   Marland Kitchen atorvastatin (LIPITOR) 40 MG tablet Take 1 tablet (40 mg total) by mouth daily.  . carvedilol (COREG) 12.5  MG tablet TAKE ONE TABLET BY MOUTH TWICE DAILY  . clobetasol cream (TEMOVATE) 7.32 % Apply 1 application topically as needed.   . Glucose Blood (BAYER BREEZE 2 TEST) DISK Check sugar 3 times daily. DX E 11.9-needs bayer breeze 2 test disc  . Glucose Blood (BAYER BREEZE 2 TEST) DISK Check sugar three times daily DX E11.9  . insulin aspart protamine - aspart (NOVOLOG MIX 70/30 FLEXPEN) (70-30) 100 UNIT/ML FlexPen Inject 20 units twice daily as needed SQ. DX E11.9  . Insulin Pen Needle 31G X 5 MM MISC Needs BD ultra fine mini needles to use twice daily DX E11.9  . Ivermectin (SOOLANTRA) 1 % CREA Apply topically daily.  Marland Kitchen losartan (COZAAR) 100 MG tablet Take 1 tablet (100 mg total) by mouth daily.  . metFORMIN (GLUCOPHAGE) 500 MG tablet Take 1 tablet (500 mg total) by mouth 2 (two) times daily with a meal.  . NON FORMULARY CPAP machine every night-measurement 10  . nystatin cream (MYCOSTATIN) APPLY  CREAM TOPICALLY TWICE DAILY AROUND  THE  GROIN  AREA (Patient taking differently: APPLY  CREAM TOPICALLY TWICE DAILY AROUND  THE  GROIN  AREA as needed)   No facility-administered encounter medications on file as of 03/13/2017.     Activities of Daily Living In your present state of health, do you  have any difficulty performing the following activities: 03/13/2017  Hearing? Y  Vision? N  Difficulty concentrating or making decisions? Y  Walking or climbing stairs? N  Dressing or bathing? N  Doing errands, shopping? N  Preparing Food and eating ? N  Using the Toilet? N  In the past six months, have you accidently leaked urine? N  Do you have problems with loss of bowel control? N  Managing your Medications? N  Managing your Finances? N  Housekeeping or managing your Housekeeping? N  Some recent data might be hidden    Patient Care Team: Jerrol Banana., MD as PCP - General (Family Medicine) Dingeldein, [name], MD as Consulting Physician (Ophthalmology) [name] as Consulting  Physician (Internal Medicine)   Assessment:     Exercise Activities and Dietary recommendations Current Exercise Habits: Home exercise routine, Type of exercise: walking, Time (Minutes): 30 (to 50 minutes), Frequency (Times/Week): 1 (to 2 times a week (currently)), Weekly Exercise (Minutes/Week): 30, Intensity: Mild, Exercise limited by: None identified  Goals    . Exercise 3x per week (30 min per time)          Recommend to return to exercising 3 days a week for 30 minutes.       Fall Risk Fall Risk  03/13/2017 03/08/2016 03/29/2015  Falls in the past year? No No No   Depression Screen PHQ 2/9 Scores 03/13/2017 03/08/2016 03/29/2015  PHQ - 2 Score 0 0 0    Cognitive Function     6CIT Screen 03/13/2017  What Year? 0 points  What month? 0 points  What time? 0 points  Count back from 20 0 points  Months in reverse 0 points  Repeat phrase 0 points  Total Score 0    Immunization History  Administered Date(s) Administered  . Influenza, High Dose Seasonal PF 06/20/2016, 03/13/2017  . Pneumococcal Conjugate-13 01/29/2014  . Pneumococcal Polysaccharide-23 09/06/2011  . Tdap 08/27/2007  . Zoster 10/26/2010   Screening Tests Health Maintenance  Topic Date Due  . FOOT EXAM  04/29/1956  . INFLUENZA VACCINE  02/07/2017  . HEMOGLOBIN A1C  04/07/2017  . TETANUS/TDAP  08/26/2017  . OPHTHALMOLOGY EXAM  10/24/2017  . COLONOSCOPY  10/05/2026  . Hepatitis C Screening  Completed  . PNA vac Low Risk Adult  Completed      Plan:  I have personally reviewed and addressed the Medicare Annual Wellness questionnaire and have noted the following in the patient's chart:  A. Medical and social history B. Use of alcohol, tobacco or illicit drugs  C. Current medications and supplements D. Functional ability and status E.  Nutritional status F.  Physical activity G. Advance directives H. List of other physicians I.  Hospitalizations, surgeries, and ER visits in previous 12 months J.   Patrick such as hearing and vision if needed, cognitive and depression L. Referrals and appointments - none  In addition, I have reviewed and discussed with patient certain preventive protocols, quality metrics, and best practice recommendations. A written personalized care plan for preventive services as well as general preventive health recommendations were provided to patient.  See attached scanned questionnaire for additional information.   Signed,  Fabio Neighbors, LPN Nurse Health Advisor   MD Recommendations: Pt needs a diabetic foot exam done today.

## 2017-03-13 NOTE — Patient Instructions (Signed)
Patrick Mcgee , Thank you for taking time to come for your Medicare Wellness Visit. I appreciate your ongoing commitment to your health goals. Please review the following plan we discussed and let me know if I can assist you in the future.   Screening recommendations/referrals: Colonoscopy: up to date Recommended yearly ophthalmology/optometry visit for glaucoma screening and checkup Recommended yearly dental visit for hygiene and checkup  Vaccinations: Influenza vaccine: completed today Pneumococcal vaccine: completed series Tdap vaccine: up to date, due 08/2017 Shingles vaccine: completed 10/26/10  Advanced directives: Please bring a copy of your POA (Power of [place]) and/or Living Will to your next appointment.   Conditions/risks identified: Obesity; Recommend to return to exercising 3 days a week for 30 minutes.   Next appointment: 03/19/18 @ 1:00 PM  Preventive Care 73 Years and Older, Male Preventive care refers to lifestyle choices and visits with your health care provider that can promote health and wellness. What does preventive care include?  A yearly physical exam. This is also called an annual well check.  Dental exams once or twice a year.  Routine eye exams. Ask your health care provider how often you should have your eyes checked.  Personal lifestyle choices, including:  Daily care of your teeth and gums.  Regular physical activity.  Eating a healthy diet.  Avoiding tobacco and drug use.  Limiting alcohol use.  Practicing safe sex.  Taking low doses of aspirin every day.  Taking vitamin and mineral supplements as recommended by your health care provider. What happens during an annual well check? The services and screenings done by your health care provider during your annual well check will depend on your age, overall health, lifestyle risk factors, and family history of disease. Counseling  Your health care provider may ask you questions about  your:  Alcohol use.  Tobacco use.  Drug use.  Emotional well-being.  Home and relationship well-being.  Sexual activity.  Eating habits.  History of falls.  Memory and ability to understand (cognition).  Work and work Statistician. Screening  You may have the following tests or measurements:  Height, weight, and BMI.  Blood pressure.  Lipid and cholesterol levels. These may be checked every 5 years, or more frequently if you are over 73 years old.  Skin check.  Lung cancer screening. You may have this screening every year starting at age 73 if you have a 30-pack-year history of smoking and currently smoke or have quit within the past 15 years.  Fecal occult blood test (FOBT) of the stool. You may have this test every year starting at age 73.  Flexible sigmoidoscopy or colonoscopy. You may have a sigmoidoscopy every 5 years or a colonoscopy every 10 years starting at age 73.  Prostate cancer screening. Recommendations will vary depending on your family history and other risks.  Hepatitis C blood test.  Hepatitis B blood test.  Sexually transmitted disease (STD) testing.  Diabetes screening. This is done by checking your blood sugar (glucose) after you have not eaten for a while (fasting). You may have this done every 1-3 years.  Abdominal aortic aneurysm (AAA) screening. You may need this if you are a current or former smoker.  Osteoporosis. You may be screened starting at age 73 if you are at high risk. Talk with your health care provider about your test results, treatment options, and if necessary, the need for more tests. Vaccines  Your health care provider may recommend certain vaccines, such as:  Influenza vaccine. This  is recommended every year.  Tetanus, diphtheria, and acellular pertussis (Tdap, Td) vaccine. You may need a Td booster every 10 years.  Zoster vaccine. You may need this after age 73.  Pneumococcal 13-valent conjugate (PCV13) vaccine.  One dose is recommended after age 73.  Pneumococcal polysaccharide (PPSV23) vaccine. One dose is recommended after age 73. Talk to your health care provider about which screenings and vaccines you need and how often you need them. This information is not intended to replace advice given to you by your health care provider. Make sure you discuss any questions you have with your health care provider. Document Released: 07/23/2015 Document Revised: 03/15/2016 Document Reviewed: 04/27/2015 Elsevier Interactive Patient Education  2017 [place] Prevention in the Home Falls can cause injuries. They can happen to people of all ages. There are many things you can do to make your home safe and to help prevent falls. What can I do on the outside of my home?  Regularly fix the edges of walkways and driveways and fix any cracks.  Remove anything that might make you trip as you walk through a door, such as a raised step or threshold.  Trim any bushes or trees on the path to your home.  Use bright outdoor lighting.  Clear any walking paths of anything that might make someone trip, such as rocks or tools.  Regularly check to see if handrails are loose or broken. Make sure that both sides of any steps have handrails.  Any raised decks and porches should have guardrails on the edges.  Have any leaves, snow, or ice cleared regularly.  Use sand or salt on walking paths during winter.  Clean up any spills in your garage right away. This includes oil or grease spills. What can I do in the bathroom?  Use night lights.  Install grab bars by the toilet and in the tub and shower. Do not use towel bars as grab bars.  Use non-skid mats or decals in the tub or shower.  If you need to sit down in the shower, use a plastic, non-slip stool.  Keep the floor dry. Clean up any water that spills on the floor as soon as it happens.  Remove soap buildup in the tub or shower regularly.  Attach bath  mats securely with double-sided non-slip rug tape.  Do not have throw rugs and other things on the floor that can make you trip. What can I do in the bedroom?  Use night lights.  Make sure that you have a light by your bed that is easy to reach.  Do not use any sheets or blankets that are too big for your bed. They should not hang down onto the floor.  Have a firm chair that has side arms. You can use this for support while you get dressed.  Do not have throw rugs and other things on the floor that can make you trip. What can I do in the kitchen?  Clean up any spills right away.  Avoid walking on wet floors.  Keep items that you use a lot in easy-to-reach places.  If you need to reach something above you, use a strong step stool that has a grab bar.  Keep electrical cords out of the way.  Do not use floor polish or wax that makes floors slippery. If you must use wax, use non-skid floor wax.  Do not have throw rugs and other things on the floor that can make you  trip. What can I do with my stairs?  Do not leave any items on the stairs.  Make sure that there are handrails on both sides of the stairs and use them. Fix handrails that are broken or loose. Make sure that handrails are as long as the stairways.  Check any carpeting to make sure that it is firmly attached to the stairs. Fix any carpet that is loose or worn.  Avoid having throw rugs at the top or bottom of the stairs. If you do have throw rugs, attach them to the floor with carpet tape.  Make sure that you have a light switch at the top of the stairs and the bottom of the stairs. If you do not have them, ask someone to add them for you. What else can I do to help prevent falls?  Wear shoes that:  Do not have high heels.  Have rubber bottoms.  Are comfortable and fit you well.  Are closed at the toe. Do not wear sandals.  If you use a stepladder:  Make sure that it is fully opened. Do not climb a closed  stepladder.  Make sure that both sides of the stepladder are locked into place.  Ask someone to hold it for you, if possible.  Clearly mark and make sure that you can see:  Any grab bars or handrails.  First and last steps.  Where the edge of each step is.  Use tools that help you move around (mobility aids) if they are needed. These include:  Canes.  Walkers.  Scooters.  Crutches.  Turn on the lights when you go into a dark area. Replace any light bulbs as soon as they burn out.  Set up your furniture so you have a clear path. Avoid moving your furniture around.  If any of your floors are uneven, fix them.  If there are any pets around you, be aware of where they are.  Review your medicines with your doctor. Some medicines can make you feel dizzy. This can increase your chance of falling. Ask your doctor what other things that you can do to help prevent falls. This information is not intended to replace advice given to you by your health care provider. Make sure you discuss any questions you have with your health care provider. Document Released: 04/22/2009 Document Revised: 12/02/2015 Document Reviewed: 07/31/2014 Elsevier Interactive Patient Education  2017 [employer].

## 2017-03-13 NOTE — Patient Instructions (Signed)
Increase Metformin to 1000 mg twice daily and discontinue Insulin.    Check glucose fasting each morning and then 2 hours after either lunch or supper.  Record the readings and call them to the office in 1 week.    Notify the office for any readings over 300 or under 60.

## 2017-03-13 NOTE — Progress Notes (Signed)
Patrick Mcgee  MRN: [id number] DOB: 1945-12-19  Subjective:  HPI   The patient is a 71 year old male who presents for follow up of his diabetes.  He was last seen in May but his last A1C was on 10/05/16 and it was 8.8.    He has been checking his glucose at home and states that he has been getting readings that have run 90's-260's.  He has been getting some readings in the 50's-60's and even some in the 30's.  He is taking Novolog 70/30 as needed.  He does not have any routine to taking the insulin.  He checks his glucose before meals and before his snacks.  If his glucose is high at any time he will take 20 units even if it is before a snack.  Patient is up to date on his eye and foot exam and his urine micro-albumin.  He has received his flu shot today.  Patient Active Problem List   Diagnosis Date Noted  . Right inguinal hernia 07/17/2015  . Family history of colon cancer 07/17/2015  . Allergic rhinitis 11/12/2014  . Airway hyperreactivity 11/12/2014  . Atherosclerosis of coronary artery 11/12/2014  . Cheilitis 11/12/2014  . Narrowing of intervertebral disc space 11/12/2014  . Deflected nasal septum 11/12/2014  . HLD (hyperlipidemia) 11/12/2014  . BP (high blood pressure) 11/12/2014  . Adult hypothyroidism 11/12/2014  . Adiposity 11/12/2014  . Sleep apnea 11/12/2014  . Diabetes mellitus, type 2 ([place]) 11/12/2014  . Arteriosclerosis of coronary artery 10/02/2012  . Degenerative disc disease, lumbar 09/24/2012  . Furunculosis 04/23/2012    Past Medical History:  Diagnosis Date  . Diabetes mellitus without complication ([place])   . Hyperlipidemia   . Hypertension   . Myocardial infarction [facility]) 1995    Social History   Social History  . Marital status: Married    Spouse name: N/A  . Number of children: N/A  . Years of education: N/A   Occupational History  . Not on file.   Social History Main Topics  . Smoking status: Former Smoker    Types: Cigars  . Smokeless  tobacco: Former Systems developer  . Alcohol use 0.6 - 3.0 oz/week    1 Standard drinks or equivalent per week  . Drug use: No  . Sexual activity: Not on file   Other Topics Concern  . Not on file   Social History Narrative  . No narrative on file    Outpatient Encounter Prescriptions as of 03/13/2017  Medication Sig Note  . aspirin 81 MG tablet Take 1 mg by mouth daily.  11/12/2014: Received from: [facility]  . atorvastatin (LIPITOR) 40 MG tablet Take 1 tablet (40 mg total) by mouth daily.   . carvedilol (COREG) 12.5 MG tablet TAKE ONE TABLET BY MOUTH TWICE DAILY   . clobetasol cream (TEMOVATE) 8.24 % Apply 1 application topically as needed.    . Glucose Blood (BAYER BREEZE 2 TEST) DISK Check sugar 3 times daily. DX E 11.9-needs bayer breeze 2 test disc   . Glucose Blood (BAYER BREEZE 2 TEST) DISK Check sugar three times daily DX E11.9   . insulin aspart protamine - aspart (NOVOLOG MIX 70/30 FLEXPEN) (70-30) 100 UNIT/ML FlexPen Inject 20 units twice daily as needed SQ. DX E11.9   . Insulin Pen Needle 31G X 5 MM MISC Needs BD ultra fine mini needles to use twice daily DX E11.9   . Ivermectin (SOOLANTRA) 1 % CREA Apply topically daily.   Marland Kitchen  losartan (COZAAR) 100 MG tablet Take 1 tablet (100 mg total) by mouth daily.   . metFORMIN (GLUCOPHAGE) 500 MG tablet Take 1 tablet (500 mg total) by mouth 2 (two) times daily with a meal.   . NON FORMULARY CPAP machine every night-measurement 10   . nystatin cream (MYCOSTATIN) APPLY  CREAM TOPICALLY TWICE DAILY AROUND  THE  GROIN  AREA (Patient taking differently: APPLY  CREAM TOPICALLY TWICE DAILY AROUND  THE  GROIN  AREA as needed)    No facility-administered encounter medications on file as of 03/13/2017.     Allergies  Allergen Reactions  . Ace Inhibitors     Other reaction(s): Unknown  . Atorvastatin   . Invokana [Canagliflozin]     Leg pain  . Sulfa Antibiotics Other (See Comments)    Joint pain  . Penicillins Rash    Review of  Systems  Constitutional: Negative for fever and malaise/fatigue.  HENT: Negative.   Respiratory: Negative for cough, shortness of breath and wheezing.   Cardiovascular: Negative for chest pain, palpitations and orthopnea.  Gastrointestinal: Negative.   Genitourinary: Negative for urgency.  Skin: Negative.   Neurological: Negative for dizziness, weakness and headaches.  Endo/Heme/Allergies: Negative for polydipsia.  Psychiatric/Behavioral: Negative.     Objective:  BP (!) 142/78   Pulse 60   Temp 99.3 F (37.4 C) (Oral)   Resp 16   Wt 203 lb (92.1 kg)   BMI 30.87 kg/m   Physical Exam  Constitutional: He is oriented to person, place, and time and well-developed, well-nourished, and in no distress.  HENT:  Head: Normocephalic and atraumatic.  Right Ear: External ear normal.  Left Ear: External ear normal.  Nose: Nose normal.  Eyes: Pupils are equal, round, and reactive to light. Conjunctivae are normal. No scleral icterus.  Neck: Normal range of motion. No thyromegaly present.  Cardiovascular: Normal rate, regular rhythm and normal heart sounds.   Pulmonary/Chest: Effort normal and breath sounds normal.  Abdominal: Soft.  Neurological: He is alert and oriented to person, place, and time.  Skin: Skin is warm and dry.  Psychiatric: Mood, memory, affect and judgment normal.    Assessment and Plan :   1. Type 2 diabetes mellitus without complication, without long-term current use of insulin (HCC) Back off on insulin . - Hemoglobin A1c  2. Adult hypothyroidism  - TSH  3. Essential hypertension  - CBC with Differential/Platelet - Comprehensive metabolic panel  4. Other hyperlipidemia  - Lipid Panel With LDL/HDL Ratio 5.CAD  I have done the exam and reviewed the chart and it is accurate to the best of my knowledge. Development worker, community has been used and  any errors in dictation or transcription are unintentional. [name] M.D. [facility]

## 2017-03-15 DIAGNOSIS — E039 Hypothyroidism, unspecified: Secondary | ICD-10-CM | POA: Diagnosis not present

## 2017-03-15 DIAGNOSIS — I10 Essential (primary) hypertension: Secondary | ICD-10-CM | POA: Diagnosis not present

## 2017-03-15 DIAGNOSIS — E119 Type 2 diabetes mellitus without complications: Secondary | ICD-10-CM | POA: Diagnosis not present

## 2017-03-15 DIAGNOSIS — E784 Other hyperlipidemia: Secondary | ICD-10-CM | POA: Diagnosis not present

## 2017-03-16 LAB — COMPREHENSIVE METABOLIC PANEL
A/G RATIO: 1.7 (ref 1.2–2.2)
ALBUMIN: 4.3 g/dL (ref 3.5–4.8)
ALK PHOS: 105 IU/L (ref 39–117)
ALT: 8 IU/L (ref 0–44)
AST: 12 IU/L (ref 0–40)
BILIRUBIN TOTAL: 1.5 mg/dL — AB (ref 0.0–1.2)
BUN/Creatinine Ratio: 15 (ref 10–24)
BUN: 9 mg/dL (ref 8–27)
CO2: 25 mmol/L (ref 20–29)
Calcium: 9.7 mg/dL (ref 8.6–10.2)
Chloride: 99 mmol/L (ref 96–106)
Creatinine, Ser: 0.6 mg/dL — ABNORMAL LOW (ref 0.76–1.27)
GFR calc non Af Amer: 100 mL/min/{1.73_m2} (ref >59)
GFR, EST AFRICAN AMERICAN: 116 mL/min/{1.73_m2} (ref >59)
GLUCOSE: 183 mg/dL — AB (ref 65–99)
Globulin, Total: 2.6 g/dL (ref 1.5–4.5)
Potassium: 4.4 mmol/L (ref 3.5–5.2)
Sodium: 137 mmol/L (ref 134–144)
TOTAL PROTEIN: 6.9 g/dL (ref 6.0–8.5)

## 2017-03-16 LAB — CBC WITH DIFFERENTIAL/PLATELET
BASOS ABS: 0 10*3/uL (ref 0.0–0.2)
BASOS: 0 %
EOS (ABSOLUTE): 0.3 10*3/uL (ref 0.0–0.4)
Eos: 4 %
HEMOGLOBIN: 13.7 g/dL (ref 13.0–17.7)
Hematocrit: 39.7 % (ref 37.5–51.0)
IMMATURE GRANS (ABS): 0 10*3/uL (ref 0.0–0.1)
Immature Granulocytes: 0 %
LYMPHS ABS: 1.2 10*3/uL (ref 0.7–3.1)
Lymphs: 18 %
MCH: 30.7 pg (ref 26.6–33.0)
MCHC: 34.5 g/dL (ref 31.5–35.7)
MCV: 89 fL (ref 79–97)
Monocytes Absolute: 0.7 10*3/uL (ref 0.1–0.9)
Monocytes: 10 %
Neutrophils Absolute: 4.6 10*3/uL (ref 1.4–7.0)
Neutrophils: 68 %
Platelets: 270 10*3/uL (ref 150–379)
RBC: 4.46 x10E6/uL (ref 4.14–5.80)
RDW: 13.5 % (ref 12.3–15.4)
WBC: 6.8 10*3/uL (ref 3.4–10.8)

## 2017-03-16 LAB — LIPID PANEL WITH LDL/HDL RATIO
Cholesterol, Total: 119 mg/dL (ref 100–199)
HDL: 45 mg/dL (ref >39)
LDL CALC: 62 mg/dL (ref 0–99)
LDL/HDL RATIO: 1.4 ratio (ref 0.0–3.6)
Triglycerides: 60 mg/dL (ref 0–149)
VLDL CHOLESTEROL CAL: 12 mg/dL (ref 5–40)

## 2017-03-16 LAB — HEMOGLOBIN A1C
Est. average glucose Bld gHb Est-mCnc: 166 mg/dL
HEMOGLOBIN A1C: 7.4 % — AB (ref 4.8–5.6)

## 2017-03-16 LAB — TSH: TSH: 3.74 u[IU]/mL (ref 0.450–4.500)

## 2017-03-19 ENCOUNTER — Telehealth: Payer: Self-pay

## 2017-03-19 NOTE — Telephone Encounter (Signed)
-----   Message from Jerrol Banana., MD sent at 03/19/2017  8:28 AM EDT ----- Labs stable.

## 2017-03-19 NOTE — Telephone Encounter (Signed)
Pt advised.   Thanks,   -Laura  

## 2017-03-20 ENCOUNTER — Telehealth: Payer: Self-pay | Admitting: Family Medicine

## 2017-03-20 NOTE — Telephone Encounter (Signed)
Spoke with patient. He was to call us today to let us know how he is doing with his sugar readings. His last office visit was on 03/13/17. At that time patient was told to stop using Insulin as needed since he was getting some hypoglycemic episodes and increase Metformin to 100 mg 1 BID. WE originally put him on Metformin after stopping Invokana earlier this year due to the cost. His sugar readings the first 3 days after the switch off insulin and increase on metformin were 175-250, then this past weekend was over 200 consistently. Now it is 140 or above. On the regimen with insulin he was getting around 100 in the morning. He wanted to see what does he need to do at this time, does he need to give the Metformin higher dose time to adjust in his body?- Kris Mouton, RMA

## 2017-03-20 NOTE — Telephone Encounter (Signed)
lmtcb-Patrick Mcgee, RMA  

## 2017-03-20 NOTE — Telephone Encounter (Signed)
Pt called wanting to talk to [place] about a new dosage on a medication he is taking.  His call back is [phone]  Thanks teri

## 2017-03-22 NOTE — Telephone Encounter (Signed)
Patient advised as below. Patient actually was taking Actos 30 mg and it was not effective and he stopped in march 2018. He wants to wait and see how his sugar does over the next few days before adjusting anything and will let us know. He states his sugar is starting to level out.-Patrick Mcgee V Janney Priego, RMA

## 2017-03-22 NOTE — Telephone Encounter (Signed)
We can go back on insulin at a low-dose of basal, Lantus insulin. Or we can add Actos 15 mg daily and see how that does.

## 2017-03-22 NOTE — Telephone Encounter (Signed)
ok 

## 2017-04-06 DIAGNOSIS — J301 Allergic rhinitis due to pollen: Secondary | ICD-10-CM | POA: Diagnosis not present

## 2017-04-06 DIAGNOSIS — J3489 Other specified disorders of nose and nasal sinuses: Secondary | ICD-10-CM | POA: Diagnosis not present

## 2017-04-11 DIAGNOSIS — I10 Essential (primary) hypertension: Secondary | ICD-10-CM | POA: Diagnosis not present

## 2017-04-11 DIAGNOSIS — I2510 Atherosclerotic heart disease of native coronary artery without angina pectoris: Secondary | ICD-10-CM | POA: Diagnosis not present

## 2017-04-15 ENCOUNTER — Other Ambulatory Visit: Payer: Self-pay | Admitting: Family Medicine

## 2017-04-16 ENCOUNTER — Ambulatory Visit (INDEPENDENT_AMBULATORY_CARE_PROVIDER_SITE_OTHER): Payer: Medicare Other | Admitting: Family Medicine

## 2017-04-16 ENCOUNTER — Encounter: Payer: Self-pay | Admitting: Family Medicine

## 2017-04-16 VITALS — BP 138/80 | HR 58 | Temp 97.6°F | Resp 16 | Wt 193.0 lb

## 2017-04-16 DIAGNOSIS — E119 Type 2 diabetes mellitus without complications: Secondary | ICD-10-CM

## 2017-04-16 NOTE — Progress Notes (Signed)
Patient: Patrick Mcgee Male    DOB: [birth year]   71 y.o.   MRN: [id number] Visit Date: 04/16/2017  Today's Provider: Wilhemena Durie, MD   Chief Complaint  Patient presents with  . Diabetes   Subjective:    HPI Pt is here today to follow up for diabetes. He reports that his blood sugars where running higher and was taking insulin PRN. Last OV his Metformin was increased to 1000 mg BID. Pt reports that now his blood sugars are running 108-130's fasting. He is not having to take the insulin now. No low blood sugars.       Allergies  Allergen Reactions  . Ace Inhibitors     Other reaction(s): Unknown  . Atorvastatin   . Invokana [Canagliflozin]     Leg pain  . Sulfa Antibiotics Other (See Comments)    Joint pain  . Penicillins Rash     Current Outpatient Prescriptions:  .  aspirin 81 MG tablet, Take 1 mg by mouth daily. , Disp: , Rfl:  .  atorvastatin (LIPITOR) 40 MG tablet, TAKE ONE TABLET BY MOUTH ONCE DAILY, Disp: 90 tablet, Rfl: 3 .  carvedilol (COREG) 12.5 MG tablet, TAKE ONE TABLET BY MOUTH TWICE DAILY, Disp: 180 tablet, Rfl: 3 .  clobetasol cream (TEMOVATE) 5.85 %, Apply 1 application topically as needed. , Disp: , Rfl:  .  Glucose Blood (BAYER BREEZE 2 TEST) DISK, Check sugar 3 times daily. DX E 11.9-needs bayer breeze 2 test disc, Disp: 100 each, Rfl: 12 .  Glucose Blood (BAYER BREEZE 2 TEST) DISK, Check sugar three times daily DX E11.9, Disp: 100 each, Rfl: 12 .  Insulin Pen Needle 31G X 5 MM MISC, Needs BD ultra fine mini needles to use twice daily DX E11.9, Disp: 100 each, Rfl: 3 .  Ivermectin (SOOLANTRA) 1 % CREA, Apply topically daily., Disp: , Rfl:  .  losartan (COZAAR) 100 MG tablet, Take 1 tablet (100 mg total) by mouth daily., Disp: 90 tablet, Rfl: 3 .  metFORMIN (GLUCOPHAGE) 1000 MG tablet, Take 0.5 tablets (500 mg total) by mouth 2 (two) times daily with a meal., Disp: 180 tablet, Rfl: 3 .  NON FORMULARY, CPAP machine every night-measurement 10,  Disp: , Rfl:  .  nystatin cream (MYCOSTATIN), APPLY  CREAM TOPICALLY TWICE DAILY AROUND  THE  GROIN  AREA (Patient taking differently: APPLY  CREAM TOPICALLY TWICE DAILY AROUND  THE  GROIN  AREA as needed), Disp: 15 g, Rfl: 1  Review of Systems  Constitutional: Negative.   HENT: Negative.   Eyes: Negative.   Respiratory: Negative.   Cardiovascular: Negative.   Gastrointestinal: Negative.   Endocrine: Negative.   Genitourinary: Negative.   Musculoskeletal: Negative.   Skin: Negative.   Allergic/Immunologic: Negative.   Neurological: Negative.   Hematological: Negative.   Psychiatric/Behavioral: Negative.     Social History  Substance Use Topics  . Smoking status: Former Smoker    Types: Cigars  . Smokeless tobacco: Former Systems developer  . Alcohol use 0.6 - 3.0 oz/week    1 Standard drinks or equivalent per week   Objective:   BP 138/80 (BP Location: Left Arm, Patient Position: Sitting, Cuff Size: Normal)   Pulse (!) 58   Temp 97.6 F (36.4 C) (Oral)   Resp 16   Wt 193 lb (87.5 kg)   BMI 29.35 kg/m  Vitals:   04/16/17 1345  BP: 138/80  Pulse: (!) 58  Resp: 16  Temp:  97.6 F (36.4 C)  TempSrc: Oral  Weight: 193 lb (87.5 kg)     Physical Exam  Constitutional: He is oriented to person, place, and time. He appears well-developed and well-nourished.  Eyes: Pupils are equal, round, and reactive to light. Conjunctivae and EOM are normal.  Neck: Normal range of motion. Neck supple.  Cardiovascular: Normal rate, regular rhythm, normal heart sounds and intact distal pulses.   Pulmonary/Chest: Effort normal and breath sounds normal.  Musculoskeletal: Normal range of motion.  Neurological: He is alert and oriented to person, place, and time. He has normal reflexes.  Skin: Skin is warm and dry.  Psychiatric: He has a normal mood and affect. His behavior is normal. Judgment and thought content normal.        Assessment & Plan:     1. Type 2 diabetes mellitus without  complication, without long-term current use of insulin (HCC) Improved on increased dose of Metformin. Follow up in 3 months. 2.CAD All risk factors treated.     HPI, Exam, and A&P Transcribed under the direction and in the presence of Richard L. Cranford Mon, MD  Electronically Signed: Katina Dung, [place], MD  [facility]

## 2017-05-21 ENCOUNTER — Telehealth: Payer: Self-pay | Admitting: Family Medicine

## 2017-05-21 MED ORDER — GLUCOSE BLOOD VI STRP: ORAL_STRIP | 3 refills | Status: DC

## 2017-05-21 NOTE — Telephone Encounter (Signed)
Pt states he has a new glucose Contour next EZ meter and is requesting Contour Next test stripes.  Pt is checking his blood sugar 4 times a day.  90 day supply  [place]  CB#[phone]/MW

## 2017-05-24 ENCOUNTER — Other Ambulatory Visit: Payer: Self-pay | Admitting: Family Medicine

## 2017-06-14 ENCOUNTER — Other Ambulatory Visit: Payer: Self-pay

## 2017-06-14 MED ORDER — METFORMIN HCL 1000 MG PO TABS: 1000.0000 mg | ORAL_TABLET | Freq: Two times a day (BID) | ORAL | 3 refills | Status: DC

## 2017-06-21 ENCOUNTER — Ambulatory Visit: Payer: Self-pay | Admitting: Family Medicine

## 2017-06-26 ENCOUNTER — Other Ambulatory Visit: Payer: Self-pay

## 2017-06-26 ENCOUNTER — Ambulatory Visit (INDEPENDENT_AMBULATORY_CARE_PROVIDER_SITE_OTHER): Payer: Medicare Other | Admitting: Family Medicine

## 2017-06-26 VITALS — BP 120/74 | HR 60 | Temp 97.6°F | Resp 16 | Wt 184.0 lb

## 2017-06-26 DIAGNOSIS — E7849 Other hyperlipidemia: Secondary | ICD-10-CM | POA: Diagnosis not present

## 2017-06-26 DIAGNOSIS — E119 Type 2 diabetes mellitus without complications: Secondary | ICD-10-CM

## 2017-06-26 DIAGNOSIS — I209 Angina pectoris, unspecified: Secondary | ICD-10-CM | POA: Diagnosis not present

## 2017-06-26 DIAGNOSIS — I25119 Atherosclerotic heart disease of native coronary artery with unspecified angina pectoris: Secondary | ICD-10-CM | POA: Diagnosis not present

## 2017-06-26 LAB — POCT GLYCOSYLATED HEMOGLOBIN (HGB A1C): HEMOGLOBIN A1C: 7.4

## 2017-06-26 NOTE — Progress Notes (Signed)
Patrick Mcgee  MRN: [id number] DOB: 1945/08/12  Subjective:  HPI   The patient is a 71 year old male who presents for follow up of his diabetes.  He was last seen on 04/16/17.  His  A1C was done on 03/15/17 and was 7.4 which was an improvement on the increased dose of Metformin.   The patient checks his glucose at home and has been getting readings in the range of 103-220.  He has lost another 9 pounds since his last visit.   He does complain that the Metformin makes him feel bad.  He complains of nausea and decreased appetite.  He does not wish to change the medicine as he realizes it has helped get his levels down  Patient Active Problem List   Diagnosis Date Noted  . Right inguinal hernia 07/17/2015  . Family history of colon cancer 07/17/2015  . Allergic rhinitis 11/12/2014  . Airway hyperreactivity 11/12/2014  . Atherosclerosis of coronary artery 11/12/2014  . Cheilitis 11/12/2014  . Narrowing of intervertebral disc space 11/12/2014  . Deflected nasal septum 11/12/2014  . HLD (hyperlipidemia) 11/12/2014  . BP (high blood pressure) 11/12/2014  . Adult hypothyroidism 11/12/2014  . Adiposity 11/12/2014  . Sleep apnea 11/12/2014  . Diabetes mellitus, type 2 ([place]) 11/12/2014  . Arteriosclerosis of coronary artery 10/02/2012  . Degenerative disc disease, lumbar 09/24/2012  . Furunculosis 04/23/2012    Past Medical History:  Diagnosis Date  . Diabetes mellitus without complication ([place])   . Hyperlipidemia   . Hypertension   . Myocardial infarction [facility]) 1995    Social History   Socioeconomic History  . Marital status: Married    Spouse name: Not on file  . Number of children: Not on file  . Years of education: Not on file  . Highest education level: Not on file  Social Needs  . Financial resource strain: Not on file  . Food insecurity - worry: Not on file  . Food insecurity - inability: Not on file  . Transportation needs - medical: Not on file  . Transportation  needs - non-medical: Not on file  Occupational History  . Not on file  Tobacco Use  . Smoking status: Former Smoker    Types: Cigars  . Smokeless tobacco: Former Network engineer and Sexual Activity  . Alcohol use: Yes    Alcohol/week: 0.6 - 3.0 oz    Types: 1 Standard drinks or equivalent per week  . Drug use: No  . Sexual activity: Not on file  Other Topics Concern  . Not on file  Social History Narrative  . Not on file    Outpatient Encounter Medications as of 06/26/2017  Medication Sig Note  . aspirin 81 MG tablet Take 1 mg by mouth daily.  11/12/2014: Received from: [facility]  . atorvastatin (LIPITOR) 40 MG tablet TAKE ONE TABLET BY MOUTH ONCE DAILY   . carvedilol (COREG) 12.5 MG tablet TAKE ONE TABLET BY MOUTH TWICE DAILY   . clobetasol cream (TEMOVATE) 4.09 % Apply 1 application topically as needed.    Marland Kitchen glucose blood (CONTOUR NEXT TEST) test strip Checks sugar 4 times daily. DX E11.9-strips for Contour next EZ meter   . Insulin Pen Needle 31G X 5 MM MISC Needs BD ultra fine mini needles to use twice daily DX E11.9   . Ivermectin (SOOLANTRA) 1 % CREA Apply topically daily.   Marland Kitchen losartan (COZAAR) 100 MG tablet TAKE ONE TABLET BY MOUTH ONCE DAILY   .  metFORMIN (GLUCOPHAGE) 1000 MG tablet Take 1 tablet (1,000 mg total) by mouth 2 (two) times daily with a meal.   . NON FORMULARY CPAP machine every night-measurement 10   . nystatin cream (MYCOSTATIN) APPLY  CREAM TOPICALLY TWICE DAILY AROUND  THE  GROIN  AREA (Patient taking differently: APPLY  CREAM TOPICALLY TWICE DAILY AROUND  THE  GROIN  AREA as needed)    No facility-administered encounter medications on file as of 06/26/2017.     Allergies  Allergen Reactions  . Ace Inhibitors     Other reaction(s): Unknown  . Atorvastatin   . Invokana [Canagliflozin]     Leg pain  . Sulfa Antibiotics Other (See Comments)    Joint pain  . Penicillins Rash    Review of Systems  Constitutional: Negative for fever  and malaise/fatigue.  Eyes: Negative.   Respiratory: Negative for cough, shortness of breath and wheezing.   Cardiovascular: Negative for chest pain, palpitations, orthopnea, claudication and leg swelling.  Gastrointestinal: Negative.   Genitourinary: Negative for frequency.  Skin: Negative.   Neurological: Negative.  Negative for weakness.  Endo/Heme/Allergies: Negative for polydipsia.  Psychiatric/Behavioral: Negative.     Objective:  BP 120/74 (BP Location: Right Arm, Patient Position: Sitting, Cuff Size: Normal)   Pulse 60   Temp 97.6 F (36.4 C) (Oral)   Resp 16   Wt 184 lb (83.5 kg)   BMI 27.98 kg/m   Physical Exam  Constitutional: He is oriented to person, place, and time and well-developed, well-nourished, and in no distress.  HENT:  Head: Normocephalic and atraumatic.  Right Ear: External ear normal.  Left Ear: External ear normal.  Nose: Nose normal.  Eyes: Conjunctivae are normal. Pupils are equal, round, and reactive to light.  Neck: Normal range of motion.  Cardiovascular: Normal rate, regular rhythm and normal heart sounds.  Pulmonary/Chest: Effort normal and breath sounds normal.  Abdominal: Soft.  Neurological: He is alert and oriented to person, place, and time.  Skin: Skin is warm and dry.    Assessment and Plan :  1. Type 2 diabetes mellitus without complication, without long-term current use of insulin (HCC) Stable.Encouraged dietary adherence. - POCT glycosylated hemoglobin (Hb A1C)--7.4 2.CAD All risk factors treated.  I have done the exam and reviewed the chart and it is accurate to the best of my knowledge. Development worker, community has been used and  any errors in dictation or transcription are unintentional. [name] M.D. [facility]

## 2017-07-03 ENCOUNTER — Encounter: Payer: Self-pay | Admitting: Family Medicine

## 2017-07-12 ENCOUNTER — Other Ambulatory Visit: Payer: Self-pay | Admitting: Family Medicine

## 2017-09-17 ENCOUNTER — Ambulatory Visit: Payer: Medicare Other | Admitting: Family Medicine

## 2017-09-19 ENCOUNTER — Encounter: Payer: Self-pay | Admitting: Family Medicine

## 2017-09-19 ENCOUNTER — Ambulatory Visit (INDEPENDENT_AMBULATORY_CARE_PROVIDER_SITE_OTHER): Payer: Medicare Other | Admitting: Family Medicine

## 2017-09-19 VITALS — BP 156/96 | HR 68 | Temp 98.3°F | Resp 16 | Wt 170.4 lb

## 2017-09-19 DIAGNOSIS — J301 Allergic rhinitis due to pollen: Secondary | ICD-10-CM

## 2017-09-19 DIAGNOSIS — I2510 Atherosclerotic heart disease of native coronary artery without angina pectoris: Secondary | ICD-10-CM

## 2017-09-19 DIAGNOSIS — I10 Essential (primary) hypertension: Secondary | ICD-10-CM

## 2017-09-19 DIAGNOSIS — E119 Type 2 diabetes mellitus without complications: Secondary | ICD-10-CM | POA: Diagnosis not present

## 2017-09-19 MED ORDER — AMLODIPINE BESYLATE 5 MG PO TABS: 5.0000 mg | ORAL_TABLET | Freq: Every day | ORAL | 0 refills | Status: DC

## 2017-09-19 MED ORDER — FLUTICASONE PROPIONATE 50 MCG/ACT NA SUSP: 2.0000 | Freq: Every day | NASAL | 12 refills | Status: DC

## 2017-09-19 NOTE — Progress Notes (Signed)
Patient: Patrick Mcgee Male    DOB: 09-22-1945   72 y.o.   MRN: [id number] Visit Date: 09/19/2017  Today's Provider: Wilhemena Durie, MD   Chief Complaint  Patient presents with  . Tailbone Pain    Started about six months ago.   . Diabetes  . Fatigue   Subjective:    Back Pain  This is a chronic problem. The current episode started more than 1 month ago (Started about 6 months ago. ). The problem is unchanged. The pain is present in the sacro-iliac. Quality: Pt says it feels "Like a bruise"  The pain is the same all the time. Associated symptoms include weight loss. Pertinent negatives include no abdominal pain, bladder incontinence, bowel incontinence, chest pain, dysuria, fever, headaches, leg pain, numbness, paresis, paresthesias, pelvic pain, perianal numbness, tingling or weakness. He has tried nothing for the symptoms. The treatment provided no relief.  Diabetes  He presents for his follow-up diabetic visit. He has type 2 diabetes mellitus. His disease course has been stable. There are no hypoglycemic associated symptoms. Pertinent negatives for hypoglycemia include no dizziness or headaches. Associated symptoms include fatigue, polydipsia and weight loss. Pertinent negatives for diabetes include no blurred vision, no chest pain, no foot paresthesias, no foot ulcerations, no polyphagia, no polyuria, no visual change and no weakness. Symptoms are stable. Current diabetic treatment includes oral agent (monotherapy). He is compliant with treatment all of the time. He is following a diabetic and generally healthy diet. His home blood glucose trend is increasing steadily (Pt reports his blood sugars have been increasing lately, especially with eating certain foods.  He also states it takes longer for his sugars to go back down. ). An ACE inhibitor/angiotensin II receptor blocker is being taken. He does not see a podiatrist.Eye exam is current.  Fall  The fall occurred in unknown  circumstances (Pt reports he falls at least once a week for several months. ). Pertinent negatives include no abdominal pain, bowel incontinence, fever, headaches, hearing loss, hematuria, loss of consciousness, nausea, numbness, tingling, visual change or vomiting.  Sinus Problem  This is a recurrent problem. Episode onset: Has been going on for three months.  The problem is unchanged. There has been no fever. Associated symptoms include congestion, sinus pressure and sneezing. Pertinent negatives include no headaches or neck pain.       Allergies  Allergen Reactions  . Ace Inhibitors     Other reaction(s): Unknown  . Atorvastatin   . Invokana [Canagliflozin]     Leg pain  . Sulfa Antibiotics Other (See Comments)    Joint pain  . Penicillins Rash     Current Outpatient Medications:  .  aspirin 81 MG tablet, Take 1 mg by mouth daily. , Disp: , Rfl:  .  atorvastatin (LIPITOR) 40 MG tablet, TAKE ONE TABLET BY MOUTH ONCE DAILY, Disp: 90 tablet, Rfl: 3 .  carvedilol (COREG) 12.5 MG tablet, TAKE ONE TABLET BY MOUTH TWICE DAILY, Disp: 180 tablet, Rfl: 3 .  clobetasol cream (TEMOVATE) 5.78 %, Apply 1 application topically as needed. , Disp: , Rfl:  .  glucose blood (CONTOUR NEXT TEST) test strip, Checks sugar 4 times daily. DX E11.9-strips for Contour next EZ meter, Disp: 360 each, Rfl: 3 .  Insulin Pen Needle 31G X 5 MM MISC, Needs BD ultra fine mini needles to use twice daily DX E11.9, Disp: 100 each, Rfl: 3 .  Ivermectin (SOOLANTRA) 1 % CREA, Apply topically  daily., Disp: , Rfl:  .  losartan (COZAAR) 100 MG tablet, TAKE ONE TABLET BY MOUTH ONCE DAILY, Disp: 90 tablet, Rfl: 3 .  metFORMIN (GLUCOPHAGE) 1000 MG tablet, Take 1 tablet (1,000 mg total) by mouth 2 (two) times daily with a meal., Disp: 180 tablet, Rfl: 3 .  NON FORMULARY, CPAP machine every night-measurement 10, Disp: , Rfl:  .  nystatin cream (MYCOSTATIN), APPLY  CREAM TOPICALLY TWICE DAILY AROUND  THE  GROIN  AREA (Patient  taking differently: APPLY  CREAM TOPICALLY TWICE DAILY AROUND  THE  GROIN  AREA as needed), Disp: 15 g, Rfl: 1  Review of Systems  Constitutional: Positive for fatigue and weight loss. Negative for fever.  HENT: Positive for congestion, postnasal drip, sinus pressure, sinus pain, sneezing and tinnitus.   Eyes: Negative for blurred vision.  Respiratory: Negative.   Cardiovascular: Negative.  Negative for chest pain.  Gastrointestinal: Negative.  Negative for abdominal pain, bowel incontinence, nausea and vomiting.  Endocrine: Positive for polydipsia. Negative for polyphagia and polyuria.  Genitourinary: Negative for bladder incontinence, dysuria, hematuria and pelvic pain.  Musculoskeletal: Positive for back pain (Lower back pain). Negative for arthralgias, gait problem, joint swelling, myalgias, neck pain and neck stiffness.  Allergic/Immunologic: Negative.   Neurological: Negative for dizziness, tingling, loss of consciousness, weakness, light-headedness, numbness, headaches and paresthesias.  Psychiatric/Behavioral: Negative.     Social History   Tobacco Use  . Smoking status: Former Smoker    Types: Cigars  . Smokeless tobacco: Former Network engineer Use Topics  . Alcohol use: Yes    Alcohol/week: 0.6 - 3.0 oz    Types: 1 Standard drinks or equivalent per week   Objective:   BP (!) 156/96 (BP Location: Right Arm, Patient Position: Sitting, Cuff Size: Normal)   Pulse 68   Temp 98.3 F (36.8 C) (Oral)   Resp 16   SpO2 96%  Vitals:   09/19/17 1607  BP: (!) 156/96  Pulse: 68  Resp: 16  Temp: 98.3 F (36.8 C)  TempSrc: Oral  SpO2: 96%     Physical Exam  Constitutional: He is oriented to person, place, and time. He appears well-developed and well-nourished.  HENT:  Head: Normocephalic and atraumatic.  Right Ear: External ear normal.  Left Ear: External ear normal.  Nose: Nose normal.  Eyes: Conjunctivae are normal.  Neck: No thyromegaly present.  Cardiovascular:  Normal rate, regular rhythm and normal heart sounds.  Pulmonary/Chest: Effort normal and breath sounds normal.  Abdominal: Soft.  Musculoskeletal: He exhibits tenderness.  Tender over coccyx.  Neurological: He is alert and oriented to person, place, and time. No cranial nerve deficit. He exhibits normal muscle tone. Coordination normal.  Nonfocal exam.  Skin: Skin is warm and dry.  Psychiatric: He has a normal mood and affect. His behavior is normal. Judgment and thought content normal.        Assessment & Plan:     1. Allergic rhinitis due to pollen, unspecified seasonality  - fluticasone (FLONASE) 50 MCG/ACT nasal spray; Place 2 sprays into both nostrils daily.  Dispense: 16 g; Refill: 12  2. Essential hypertension Add norvasc. - amLODipine (NORVASC) 5 MG tablet; Take 1 tablet (5 mg total) by mouth daily.  Dispense: 90 tablet; Refill: 0  3. Type 2 diabetes mellitus without complication, without long-term current use of insulin ([place]) 4.Weight loss Seems to be from dietary changes--follow. 5.Coccyx pain 6.Falls Encouraged safe exercise.       I have done the exam and  reviewed the above chart and it is accurate to the best of my knowledge. Development worker, community has been used in this note in any air is in the dictation or transcription are unintentional.  Wilhemena Durie, MD  [facility]

## 2017-10-10 ENCOUNTER — Telehealth: Payer: Self-pay | Admitting: Family Medicine

## 2017-10-10 NOTE — Telephone Encounter (Signed)
Keep appt

## 2017-10-10 NOTE — Telephone Encounter (Signed)
pt called saying when he was in about 2 weeks ago it was too early to check his A1C and he thnks his blood sugar levels have been high   His readings have 140 to 200.  Mostly around 170  Please advise  Thanks teri

## 2017-10-10 NOTE — Telephone Encounter (Signed)
Pt advised.

## 2017-10-10 NOTE — Telephone Encounter (Signed)
Please advise. FYI: Pt has an appt on 10/25/17.

## 2017-10-25 ENCOUNTER — Encounter: Payer: Self-pay | Admitting: Family Medicine

## 2017-10-25 ENCOUNTER — Telehealth: Payer: Self-pay | Admitting: Family Medicine

## 2017-10-25 ENCOUNTER — Ambulatory Visit (INDEPENDENT_AMBULATORY_CARE_PROVIDER_SITE_OTHER): Payer: Medicare Other | Admitting: Family Medicine

## 2017-10-25 VITALS — BP 138/62 | HR 64 | Temp 97.8°F | Resp 14 | Wt 165.0 lb

## 2017-10-25 DIAGNOSIS — I10 Essential (primary) hypertension: Secondary | ICD-10-CM

## 2017-10-25 DIAGNOSIS — E119 Type 2 diabetes mellitus without complications: Secondary | ICD-10-CM | POA: Diagnosis not present

## 2017-10-25 DIAGNOSIS — E7849 Other hyperlipidemia: Secondary | ICD-10-CM

## 2017-10-25 DIAGNOSIS — I2510 Atherosclerotic heart disease of native coronary artery without angina pectoris: Secondary | ICD-10-CM

## 2017-10-25 LAB — POCT GLYCOSYLATED HEMOGLOBIN (HGB A1C): HEMOGLOBIN A1C: 7.9

## 2017-10-25 MED ORDER — EMPAGLIFLOZIN 10 MG PO TABS: 10.0000 mg | ORAL_TABLET | Freq: Every day | ORAL | 0 refills | Status: DC

## 2017-10-25 NOTE — Telephone Encounter (Signed)
yes

## 2017-10-25 NOTE — Telephone Encounter (Signed)
No answer

## 2017-10-25 NOTE — Telephone Encounter (Signed)
Patient needs to know if he is to continue the Metformin while taking Jardiance or d/ the Metformin.

## 2017-10-25 NOTE — Telephone Encounter (Signed)
Please advise 

## 2017-10-25 NOTE — Progress Notes (Signed)
Patrick Mcgee  MRN: [id number] DOB: 1946-03-05  Subjective:  HPI   The patient is a 72 year old male who presents for follow up of his diabetes.  He was last seen for this on 06/26/18 and at that time his A1C was 7.4. His blood sugar has been elevated at home. He called stating his blood sugars had been running higher 140-200 and mostly around 170. He reports they have come down some now but they have still been all over the place.   Hypertension- Pt Bp was elevated at 156/96 at last OV. He was started on amlodipine 5 mg. He reports that he is doing well on the medication. His BP has been running 130-140's/60-70's at home. He reports that he has felt more sluggish and wanted to sleep all the time but that it could be his allergies as well.   Overall he feels pretty well. Patient Active Problem List   Diagnosis Date Noted  . Right inguinal hernia 07/17/2015  . Family history of colon cancer 07/17/2015  . Allergic rhinitis 11/12/2014  . Airway hyperreactivity 11/12/2014  . Atherosclerosis of coronary artery 11/12/2014  . Cheilitis 11/12/2014  . Narrowing of intervertebral disc space 11/12/2014  . Deflected nasal septum 11/12/2014  . HLD (hyperlipidemia) 11/12/2014  . BP (high blood pressure) 11/12/2014  . Adult hypothyroidism 11/12/2014  . Adiposity 11/12/2014  . Sleep apnea 11/12/2014  . Diabetes mellitus, type 2 ([place]) 11/12/2014  . Arteriosclerosis of coronary artery 10/02/2012  . Degenerative disc disease, lumbar 09/24/2012  . Furunculosis 04/23/2012    Past Medical History:  Diagnosis Date  . Diabetes mellitus without complication ([place])   . Hyperlipidemia   . Hypertension   . Myocardial infarction [facility]) 1995    Social History   Socioeconomic History  . Marital status: Married    Spouse name: Not on file  . Number of children: Not on file  . Years of education: Not on file  . Highest education level: Not on file  Occupational History  . Not on file  Social  Needs  . Financial resource strain: Not on file  . Food insecurity:    Worry: Not on file    Inability: Not on file  . Transportation needs:    Medical: Not on file    Non-medical: Not on file  Tobacco Use  . Smoking status: Former Smoker    Types: Cigars  . Smokeless tobacco: Former Network engineer and Sexual Activity  . Alcohol use: Yes    Alcohol/week: 0.6 - 3.0 oz    Types: 1 Standard drinks or equivalent per week  . Drug use: No  . Sexual activity: Not on file  Lifestyle  . Physical activity:    Days per week: Not on file    Minutes per session: Not on file  . Stress: Not on file  Relationships  . Social connections:    Talks on phone: Not on file    Gets together: Not on file    Attends religious service: Not on file    Active member of club or organization: Not on file    Attends meetings of clubs or organizations: Not on file    Relationship status: Not on file  . Intimate partner violence:    Fear of current or ex partner: Not on file    Emotionally abused: Not on file    Physically abused: Not on file    Forced sexual activity: Not on file  Other Topics Concern  .  Not on file  Social History Narrative  . Not on file    Outpatient Encounter Medications as of 10/25/2017  Medication Sig Note  . amLODipine (NORVASC) 5 MG tablet Take 1 tablet (5 mg total) by mouth daily.   Marland Kitchen aspirin 81 MG tablet Take 1 mg by mouth daily.  11/12/2014: Received from: [facility] [facility]  . atorvastatin (LIPITOR) 40 MG tablet TAKE ONE TABLET BY MOUTH ONCE DAILY   . carvedilol (COREG) 12.5 MG tablet TAKE ONE TABLET BY MOUTH TWICE DAILY   . fluticasone (FLONASE) 50 MCG/ACT nasal spray Place 2 sprays into both nostrils daily.   Marland Kitchen glucose blood (CONTOUR NEXT TEST) test strip Checks sugar 4 times daily. DX E11.9-strips for Contour next EZ meter   . Insulin Pen Needle 31G X 5 MM MISC Needs BD ultra fine mini needles to use twice daily DX E11.9   . Ivermectin (SOOLANTRA) 1 % CREA  Apply topically daily.   Marland Kitchen losartan (COZAAR) 100 MG tablet TAKE ONE TABLET BY MOUTH ONCE DAILY   . metFORMIN (GLUCOPHAGE) 1000 MG tablet Take 1 tablet (1,000 mg total) by mouth 2 (two) times daily with a meal.   . NON FORMULARY CPAP machine every night-measurement 10   . nystatin cream (MYCOSTATIN) APPLY  CREAM TOPICALLY TWICE DAILY AROUND  THE  GROIN  AREA (Patient taking differently: APPLY  CREAM TOPICALLY TWICE DAILY AROUND  THE  GROIN  AREA as needed)   . clobetasol cream (TEMOVATE) 9.52 % Apply 1 application topically as needed.     No facility-administered encounter medications on file as of 10/25/2017.     Allergies  Allergen Reactions  . Ace Inhibitors     Other reaction(s): Unknown  . Atorvastatin   . Invokana [Canagliflozin]     Leg pain  . Sulfa Antibiotics Other (See Comments)    Joint pain  . Penicillins Rash    Review of Systems  Constitutional: Positive for malaise/fatigue.  HENT: Positive for congestion.   Eyes: Negative.   Respiratory: Negative.   Cardiovascular: Negative.   Gastrointestinal: Negative.   Genitourinary: Negative.   Musculoskeletal: Negative.   Skin: Negative.   Neurological: Negative.   Endo/Heme/Allergies: Negative.   Psychiatric/Behavioral: Negative.     Objective:  BP 138/62 (BP Location: Left Arm, Patient Position: Sitting, Cuff Size: Normal)   Pulse 64   Temp 97.8 F (36.6 C) (Oral)   Resp 14   Wt 165 lb (74.8 kg)   BMI 25.09 kg/m   Physical Exam  Constitutional: He is oriented to person, place, and time and well-developed, well-nourished, and in no distress.  HENT:  Head: Normocephalic and atraumatic.  Eyes: Conjunctivae are normal. No scleral icterus.  Neck: No thyromegaly present.  Cardiovascular: Normal rate, regular rhythm and normal heart sounds.  Pulmonary/Chest: Effort normal and breath sounds normal.  Abdominal: Soft.  Neurological: He is alert and oriented to person, place, and time. Gait normal. GCS score is 15.    Foot exam normal.  Skin: Skin is warm and dry.  Psychiatric: Mood, memory, affect and judgment normal.    Assessment and Plan :  1. Type 2 diabetes mellitus without complication, without long-term current use of insulin (HCC) Jardiance 10mg  q am if tolerated. Consider Actos 15 mg daily. - POCT HgB A1C--7.9 - empagliflozin (JARDIANCE) 10 MG TABS tablet; Take 10 mg by mouth daily.  Dispense: 30 tablet; Refill: 0  2. Essential hypertension 3.CAD 4.Weight Loss Appears to be intentional.May need evaluation. 5.HLD  On statin.  I have done the exam and reviewed the chart and it is accurate to the best of my knowledge. Development worker, community has been used and  any errors in dictation or transcription are unintentional. [name] M.D. [facility]

## 2017-10-26 NOTE — Telephone Encounter (Signed)
LMTCB ED 

## 2017-10-26 NOTE — Telephone Encounter (Signed)
Advised  ED 

## 2017-11-07 DIAGNOSIS — C689 Malignant neoplasm of urinary organ, unspecified: Secondary | ICD-10-CM

## 2017-11-07 HISTORY — DX: Malignant neoplasm of urinary organ, unspecified: C68.9

## 2017-11-14 ENCOUNTER — Telehealth: Payer: Self-pay | Admitting: Emergency Medicine

## 2017-11-14 MED ORDER — PIOGLITAZONE HCL 15 MG PO TABS: 15.0000 mg | ORAL_TABLET | Freq: Every day | ORAL | 3 refills | Status: DC

## 2017-11-14 NOTE — Telephone Encounter (Signed)
That is fine to prescribe.  Make sure he has a f/u appt scheduled for diabetes as well  Bacigalupo, Dionne Bucy, MD, MPH [facility] 11/14/2017 11:34 AM

## 2017-11-14 NOTE — Telephone Encounter (Signed)
Ok to prescribe

## 2017-11-14 NOTE — Telephone Encounter (Signed)
Pt called stating that Dr. Rosanna Randy started him on Jardiance and told him if it was too expensive he could try another medication instead. He reports that that jardiance is working but is expensive. Pt would like to try the other medication. According to his note, it looks like he was talking about Actos 15 mg. He would like a 30 day supply sen to [address] to see how he does on it first please advise. thanks.

## 2017-11-14 NOTE — Telephone Encounter (Signed)
Pt had FU scheduled for July. Sent medication to pharmacy.

## 2017-11-23 ENCOUNTER — Telehealth: Payer: Self-pay | Admitting: Family Medicine

## 2017-11-23 DIAGNOSIS — E119 Type 2 diabetes mellitus without complications: Secondary | ICD-10-CM

## 2017-11-23 MED ORDER — EMPAGLIFLOZIN 10 MG PO TABS: 10.0000 mg | ORAL_TABLET | Freq: Every day | ORAL | 0 refills | Status: DC

## 2017-11-23 NOTE — Telephone Encounter (Signed)
Pt contacted office for refill request on the following medications:  empagliflozin (JARDIANCE) 10 MG TABS tablet   [place]  Pt stated he called and spoke with someone in our office on Monday 11/19/17 explaining that when he went to the pharmacy to pick up the medication (pioglitazone (ACTOS) 15 MG tablet) that pt was supposed to try because of the price of Jardiance, he realized he has tried Actos in the past and it didn't work. Pt stated that he didn't get Actos and contacted our office to get empagliflozin (JARDIANCE) 10 MG TABS tablet. Pt stated that Jardiance works and he will cover the cost of the medication. Pt is requesting the Rx be sent in today if possible because he is completely out of the samples he had. Please advise. Thanks TNP

## 2017-11-23 NOTE — Telephone Encounter (Signed)
New Rx for Jardiance sent to pharmacy.  Virginia Crews, MD, MPH [facility] 11/23/2017 11:12 AM

## 2017-11-23 NOTE — Telephone Encounter (Signed)
Please review

## 2017-12-01 ENCOUNTER — Encounter: Payer: Self-pay | Admitting: Emergency Medicine

## 2017-12-01 ENCOUNTER — Emergency Department
Admission: EM | Admit: 2017-12-01 | Discharge: 2017-12-01 | Disposition: A | Discharge status: Home / Self Care | Payer: Medicare Other | Attending: Emergency Medicine | Admitting: Emergency Medicine

## 2017-12-01 ENCOUNTER — Emergency Department: Payer: Medicare Other

## 2017-12-01 DIAGNOSIS — E119 Type 2 diabetes mellitus without complications: Secondary | ICD-10-CM | POA: Diagnosis not present

## 2017-12-01 DIAGNOSIS — Z955 Presence of coronary angioplasty implant and graft: Secondary | ICD-10-CM | POA: Diagnosis not present

## 2017-12-01 DIAGNOSIS — R109 Unspecified abdominal pain: Secondary | ICD-10-CM

## 2017-12-01 DIAGNOSIS — R634 Abnormal weight loss: Secondary | ICD-10-CM | POA: Insufficient documentation

## 2017-12-01 DIAGNOSIS — I252 Old myocardial infarction: Secondary | ICD-10-CM | POA: Diagnosis not present

## 2017-12-01 DIAGNOSIS — R1110 Vomiting, unspecified: Secondary | ICD-10-CM | POA: Diagnosis not present

## 2017-12-01 DIAGNOSIS — N400 Enlarged prostate without lower urinary tract symptoms: Secondary | ICD-10-CM | POA: Diagnosis not present

## 2017-12-01 DIAGNOSIS — I10 Essential (primary) hypertension: Secondary | ICD-10-CM | POA: Diagnosis not present

## 2017-12-01 DIAGNOSIS — R590 Localized enlarged lymph nodes: Secondary | ICD-10-CM | POA: Diagnosis not present

## 2017-12-01 DIAGNOSIS — R1030 Lower abdominal pain, unspecified: Secondary | ICD-10-CM | POA: Diagnosis present

## 2017-12-01 DIAGNOSIS — Z87891 Personal history of nicotine dependence: Secondary | ICD-10-CM | POA: Diagnosis not present

## 2017-12-01 DIAGNOSIS — R1031 Right lower quadrant pain: Secondary | ICD-10-CM | POA: Diagnosis not present

## 2017-12-01 LAB — CBC
HCT: 37.3 % — ABNORMAL LOW (ref 40.0–52.0)
Hemoglobin: 12.6 g/dL — ABNORMAL LOW (ref 13.0–18.0)
MCH: 29.7 pg (ref 26.0–34.0)
MCHC: 33.8 g/dL (ref 32.0–36.0)
MCV: 87.8 fL (ref 80.0–100.0)
Platelets: 302 K/uL (ref 150–440)
RBC: 4.25 MIL/uL — ABNORMAL LOW (ref 4.40–5.90)
RDW: 14.3 % (ref 11.5–14.5)
WBC: 10.6 K/uL (ref 3.8–10.6)

## 2017-12-01 LAB — COMPREHENSIVE METABOLIC PANEL
ALK PHOS: 86 U/L (ref 38–126)
ALT: 14 U/L — AB (ref 17–63)
AST: 22 U/L (ref 15–41)
Albumin: 3.9 g/dL (ref 3.5–5.0)
Anion gap: 8 (ref 5–15)
BUN: 15 mg/dL (ref 6–20)
CHLORIDE: 97 mmol/L — AB (ref 101–111)
CO2: 28 mmol/L (ref 22–32)
CREATININE: 0.58 mg/dL — AB (ref 0.61–1.24)
Calcium: 10 mg/dL (ref 8.9–10.3)
GFR calc non Af Amer: >60 mL/min (ref >60)
Glucose, Bld: 167 mg/dL — ABNORMAL HIGH (ref 65–99)
Potassium: 4.2 mmol/L (ref 3.5–5.1)
SODIUM: 133 mmol/L — AB (ref 135–145)
Total Bilirubin: 1.1 mg/dL (ref 0.3–1.2)
Total Protein: 7.9 g/dL (ref 6.5–8.1)

## 2017-12-01 LAB — LIPASE, BLOOD: Lipase: 18 U/L (ref 11–51)

## 2017-12-01 MED ORDER — ONDANSETRON 4 MG PO TBDP: 4.0000 mg | ORAL_TABLET | Freq: Three times a day (TID) | ORAL | 0 refills | Status: DC | PRN

## 2017-12-01 MED ORDER — OXYCODONE-ACETAMINOPHEN 5-325 MG PO TABS: 1.0000 | ORAL_TABLET | Freq: Three times a day (TID) | ORAL | 0 refills | Status: DC | PRN

## 2017-12-01 MED ORDER — IOPAMIDOL (ISOVUE-300) INJECTION 61%
100.0000 mL | Freq: Once | INTRAVENOUS | Status: AC | PRN
  Administered 2017-12-01: 100 mL via INTRAVENOUS

## 2017-12-01 MED ORDER — ONDANSETRON HCL 4 MG/2ML IJ SOLN
4.0000 mg | Freq: Once | INTRAMUSCULAR | Status: AC
  Administered 2017-12-01: 4 mg via INTRAVENOUS
  Filled 2017-12-01: qty 2

## 2017-12-01 MED ORDER — MORPHINE SULFATE (PF) 4 MG/ML IV SOLN
4.0000 mg | Freq: Once | INTRAVENOUS | Status: AC
  Administered 2017-12-01: 4 mg via INTRAVENOUS
  Filled 2017-12-01: qty 1

## 2017-12-01 NOTE — ED Provider Notes (Signed)
I have discussed with oncology on-call who will see him on Tuesday for possible new cancer diagnosis.   Earleen Newport, MD 12/01/17 1104

## 2017-12-01 NOTE — ED Notes (Signed)
Pt states abd pain after eating x 2 weeks. Has been taking pepto bismol. Had gum surgery on Monday. Points to LLQ and R sided pain. Still has appendix. States has lost 100lb in 3 years but more recently has had decreased appetite d/t changes in diabetes medicine and has been losing a few lbs in last month. Alert, oriented, ambulatory.

## 2017-12-01 NOTE — ED Notes (Signed)
Per CT, pt labs are okay and he can take metformin after CT contrast. Order DC at this time to hold metformin.

## 2017-12-01 NOTE — ED Notes (Signed)
Dr Williams at bedside 

## 2017-12-01 NOTE — ED Notes (Signed)
Pt taken to CT.

## 2017-12-01 NOTE — ED Provider Notes (Signed)
[facility] Emergency Department Provider Note       Time seen: ----------------------------------------- 8:17 AM on 12/01/2017 -----------------------------------------   I have reviewed the triage vital signs and the nursing notes.  HISTORY   Chief Complaint Abdominal Pain    HPI Patrick Mcgee is a 72 y.o. male with a history of diabetes, hyperlipidemia, hypertension, MI who presents to the ED for abdominal pain for the past 2 weeks it got worse last night.  He complains of lower abdominal pain, worse on the right side.  He reports vomiting 3 times last night but no diarrhea.  Patient also reports recent 25 to 30 pound weight loss recently.  His pain is diffusely across his lower abdomen, perhaps worse in the right side.  Past Medical History:  Diagnosis Date  . Diabetes mellitus without complication ([place])   . Hyperlipidemia   . Hypertension   . Myocardial infarction [facility]) 1995    Patient Active Problem List   Diagnosis Date Noted  . Right inguinal hernia 07/17/2015  . Family history of colon cancer 07/17/2015  . Allergic rhinitis 11/12/2014  . Airway hyperreactivity 11/12/2014  . Atherosclerosis of coronary artery 11/12/2014  . Cheilitis 11/12/2014  . Narrowing of intervertebral disc space 11/12/2014  . Deflected nasal septum 11/12/2014  . HLD (hyperlipidemia) 11/12/2014  . BP (high blood pressure) 11/12/2014  . Adult hypothyroidism 11/12/2014  . Adiposity 11/12/2014  . Sleep apnea 11/12/2014  . Diabetes mellitus, type 2 ([place]) 11/12/2014  . Arteriosclerosis of coronary artery 10/02/2012  . Degenerative disc disease, lumbar 09/24/2012  . Furunculosis 04/23/2012    Past Surgical History:  Procedure Laterality Date  . CATARACT EXTRACTION Left   . COLONOSCOPY  2010   [facility]  . COLONOSCOPY WITH PROPOFOL N/A 10/04/2016   Procedure: COLONOSCOPY WITH PROPOFOL;  Surgeon: [name], MD;  Location: [facility] ENDOSCOPY;  Service: Endoscopy;   Laterality: N/A;  . [place], 2000, 2001, 2014  . INGUINAL HERNIA REPAIR Right 08/09/2015   Procedure: HERNIA REPAIR INGUINAL ADULT;  Surgeon: [name], MD;  Location: [facility] ORS;  Service: General;  Laterality: Right;  . NASAL SINUS SURGERY    . nuclear stress test      Allergies Ace inhibitors; Atorvastatin; Invokana [canagliflozin]; Sulfa antibiotics; and Penicillins  Social History Social History   Tobacco Use  . Smoking status: Former Smoker    Types: Cigars  . Smokeless tobacco: Former Network engineer Use Topics  . Alcohol use: Not Currently    Alcohol/week: 0.6 - 3.0 oz    Types: 1 Standard drinks or equivalent per week  . Drug use: No   Review of Systems Constitutional: Negative for fever. Cardiovascular: Negative for chest pain. Respiratory: Negative for shortness of breath. Gastrointestinal: Positive for abdominal pain Musculoskeletal: Negative for back pain. Skin: Negative for rash. Neurological: Negative for headaches, focal weakness or numbness.  All systems negative/normal/unremarkable except as stated in the HPI  ____________________________________________   PHYSICAL EXAM:  VITAL SIGNS: ED Triage Vitals [12/01/17 0809]  Enc Vitals Group     BP      Pulse      Resp      Temp      Temp src      SpO2      Weight 160 lb (72.6 kg)     Height 5\' 8"  (1.727 m)     Head Circumference      Peak Flow  Pain Score 5     Pain Loc      Pain Edu?      Excl. in [place]?    Constitutional: Alert and oriented. Well appearing and in no distress. Eyes: Conjunctivae are normal. Normal extraocular movements. ENT   Head: Normocephalic and atraumatic.   Nose: No congestion/rhinnorhea.   Mouth/Throat: Mucous membranes are moist.   Neck: No stridor. Cardiovascular: Normal rate, regular rhythm. No murmurs, rubs, or gallops. Respiratory: Normal respiratory effort without tachypnea nor retractions.  Breath sounds are clear and equal bilaterally. No wheezes/rales/rhonchi. Gastrointestinal: Nonfocal lower abdominal tenderness, no rebound or guarding.  Normal bowel sounds. Musculoskeletal: Nontender with normal range of motion in extremities. No lower extremity tenderness nor edema. Neurologic:  Normal speech and language. No gross focal neurologic deficits are appreciated.  Skin:  Skin is warm, dry and intact. No rash noted. Psychiatric: Mood and affect are normal. Speech and behavior are normal.  ____________________________________________  EKG: Interpreted by me.  Sinus rhythm rate 69 bpm, normal PR interval, normal QRS, normal QT.  ____________________________________________  ED COURSE:  As part of my medical decision making, I reviewed the following data within the [place] History obtained from family if available, nursing notes, old chart and ekg, as well as notes from prior ED visits. Patient presented for abdominal pain, we will assess with labs and imaging as indicated at this time.   Procedures ____________________________________________   LABS (pertinent positives/negatives)  Labs Reviewed  COMPREHENSIVE METABOLIC PANEL - Abnormal; Notable for the following components:      Result Value   Sodium 133 (*)    Chloride 97 (*)    Glucose, Bld 167 (*)    Creatinine, Ser 0.58 (*)    ALT 14 (*)    All other components within normal limits  CBC - Abnormal; Notable for the following components:   RBC 4.25 (*)    Hemoglobin 12.6 (*)    HCT 37.3 (*)    All other components within normal limits  LIPASE, BLOOD  URINALYSIS, COMPLETE (UACMP) WITH MICROSCOPIC    RADIOLOGY Images were viewed by me  CT the abdomen pelvis with contrast IMPRESSION: 1. Suspicious enhancing, filling defect involving the lower pole collecting system of the left kidney with asymmetric hypoenhancement of the inferior pole cortex of left kidney noted. Findings worrisome for  urothelial neoplasm. 2. Abdominal and pelvic adenopathy compatible with nodal metastasis. 3.  Aortic Atherosclerosis (ICD10-I70.0). 4. Kidney cysts. 5. Prostate gland enlargement.  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 Patient's labs did not reveal any acute process. Patient's imaging was worrisome for lower pole left kidney lesion and abdominal and pelvic adenopathy.  These findings are suspicious for metastasis from urothelial neoplasm.  I will discuss with oncology and arrange for close outpatient follow-up.  He will be discharged with pain medicine and antiemetics to take as needed.   Laurence Aly, MD   Note: This note was generated in part or whole with voice recognition software. Voice recognition is usually quite accurate but there are transcription errors that can and very often do occur. I apologize for any typographical errors that were not detected and corrected.     Earleen Newport, MD 12/01/17 1036

## 2017-12-01 NOTE — ED Triage Notes (Signed)
Patient presents to the ED with abdominal pain x 2 weeks that was worse last night.  Lower abdominal pain, worse on the right side.  Patient reports vomiting x 3 last night.  Patient denies diarrhea.  Patient ambulatory to registration desk without obvious difficulty.

## 2017-12-02 ENCOUNTER — Other Ambulatory Visit: Payer: Self-pay | Admitting: Family Medicine

## 2017-12-02 DIAGNOSIS — I10 Essential (primary) hypertension: Secondary | ICD-10-CM

## 2017-12-04 ENCOUNTER — Encounter: Payer: Self-pay | Admitting: Emergency Medicine

## 2017-12-04 DIAGNOSIS — I10 Essential (primary) hypertension: Secondary | ICD-10-CM | POA: Insufficient documentation

## 2017-12-04 DIAGNOSIS — I2510 Atherosclerotic heart disease of native coronary artery without angina pectoris: Secondary | ICD-10-CM | POA: Insufficient documentation

## 2017-12-04 DIAGNOSIS — E119 Type 2 diabetes mellitus without complications: Secondary | ICD-10-CM | POA: Insufficient documentation

## 2017-12-04 DIAGNOSIS — C801 Malignant (primary) neoplasm, unspecified: Secondary | ICD-10-CM | POA: Diagnosis not present

## 2017-12-04 DIAGNOSIS — Z7982 Long term (current) use of aspirin: Secondary | ICD-10-CM | POA: Diagnosis not present

## 2017-12-04 DIAGNOSIS — K6289 Other specified diseases of anus and rectum: Secondary | ICD-10-CM | POA: Diagnosis not present

## 2017-12-04 DIAGNOSIS — Z87891 Personal history of nicotine dependence: Secondary | ICD-10-CM | POA: Diagnosis not present

## 2017-12-04 DIAGNOSIS — Z7984 Long term (current) use of oral hypoglycemic drugs: Secondary | ICD-10-CM | POA: Diagnosis not present

## 2017-12-04 DIAGNOSIS — Z79899 Other long term (current) drug therapy: Secondary | ICD-10-CM | POA: Insufficient documentation

## 2017-12-04 DIAGNOSIS — K5909 Other constipation: Secondary | ICD-10-CM | POA: Insufficient documentation

## 2017-12-04 DIAGNOSIS — I252 Old myocardial infarction: Secondary | ICD-10-CM | POA: Diagnosis not present

## 2017-12-04 DIAGNOSIS — C799 Secondary malignant neoplasm of unspecified site: Secondary | ICD-10-CM | POA: Diagnosis not present

## 2017-12-04 DIAGNOSIS — K5900 Constipation, unspecified: Secondary | ICD-10-CM | POA: Diagnosis not present

## 2017-12-04 DIAGNOSIS — E039 Hypothyroidism, unspecified: Secondary | ICD-10-CM | POA: Diagnosis not present

## 2017-12-04 NOTE — ED Triage Notes (Signed)
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 Pt not advised to take stool softener and is ion ED today due to bloating and constipation. Pt sts, "I can only pass small balls but I cant have a normal bowl movement."

## 2017-12-05 ENCOUNTER — Emergency Department: Payer: Medicare Other

## 2017-12-05 ENCOUNTER — Emergency Department
Admission: EM | Admit: 2017-12-05 | Discharge: 2017-12-05 | Disposition: A | Discharge status: Home / Self Care | Payer: Medicare Other | Attending: Emergency Medicine | Admitting: Emergency Medicine

## 2017-12-05 DIAGNOSIS — R198 Other specified symptoms and signs involving the digestive system and abdomen: Secondary | ICD-10-CM

## 2017-12-05 DIAGNOSIS — C801 Malignant (primary) neoplasm, unspecified: Secondary | ICD-10-CM | POA: Diagnosis not present

## 2017-12-05 DIAGNOSIS — C799 Secondary malignant neoplasm of unspecified site: Secondary | ICD-10-CM | POA: Diagnosis not present

## 2017-12-05 DIAGNOSIS — K5909 Other constipation: Secondary | ICD-10-CM | POA: Diagnosis not present

## 2017-12-05 MED ORDER — DOCUSATE SODIUM 100 MG PO CAPS: 100.0000 mg | ORAL_CAPSULE | Freq: Two times a day (BID) | ORAL | 0 refills | Status: DC

## 2017-12-05 MED ORDER — POLYETHYLENE GLYCOL 3350 17 G PO PACK: 17.0000 g | PACK | Freq: Two times a day (BID) | ORAL | 0 refills | Status: DC

## 2017-12-05 NOTE — ED Provider Notes (Signed)
[facility] Emergency Department Provider Note  Who comes into the hospital today with some constipation. ____________________________________________   First MD Initiated Contact with Patient 12/05/17 0110     (approximate)  I have reviewed the triage vital signs and the nursing notes.   HISTORY  Chief Complaint Abdominal Pain    HPI Patrick Mcgee is a 72 y.o. male who comes into the hospital today with a complaint of constipation.  The patient states that he was here last Saturday morning and had a CT scan where he was diagnosed with possible kidney cancer.  He was given Percocet for his pain and was supposed to see the cancer doctor today.  They report that they called to see the doctor but was unable to get an appointment until Friday.  The pain medicine has helped to take away his pain and he is been taking it every 8 hours but he has not had a bowel movement in about 6 to 7 days.  Today he felt some pressure in his abdomen and then some pressure in his rectum.  He states that he did try to go to the bathroom and passed a few balls but then could not get anything else the past.  The patient tried an enema as well as some probiotic and Metamucil.  The patient states that the paperwork which she was given last time told him not to take any laxatives.  The patient's family is concerned that he needs to be in the hospital as he has not yet had any evaluation and treatment for his cancer.  They are very anxious about this potential new diagnosis.  They state that he is lost some weight and is also had some dental work done.  He is here tonight for evaluation.  He states that he did have some vomiting before he came to the hospital the first time.  He has had no other nausea or vomiting.  His wife states that he has not been eating much.   Past Medical History:  Diagnosis Date  . Diabetes mellitus without complication ([place])   . Hyperlipidemia   . Hypertension   .  Myocardial infarction [facility]) 1995    Patient Active Problem List   Diagnosis Date Noted  . Right inguinal hernia 07/17/2015  . Family history of colon cancer 07/17/2015  . Allergic rhinitis 11/12/2014  . Airway hyperreactivity 11/12/2014  . Atherosclerosis of coronary artery 11/12/2014  . Cheilitis 11/12/2014  . Narrowing of intervertebral disc space 11/12/2014  . Deflected nasal septum 11/12/2014  . HLD (hyperlipidemia) 11/12/2014  . BP (high blood pressure) 11/12/2014  . Adult hypothyroidism 11/12/2014  . Adiposity 11/12/2014  . Sleep apnea 11/12/2014  . Diabetes mellitus, type 2 ([place]) 11/12/2014  . Arteriosclerosis of coronary artery 10/02/2012  . Degenerative disc disease, lumbar 09/24/2012  . Furunculosis 04/23/2012    Past Surgical History:  Procedure Laterality Date  . CATARACT EXTRACTION Left   . COLONOSCOPY  2010   [facility]  . COLONOSCOPY WITH PROPOFOL N/A 10/04/2016   Procedure: COLONOSCOPY WITH PROPOFOL;  Surgeon: [name], MD;  Location: [facility] ENDOSCOPY;  Service: Endoscopy;  Laterality: N/A;  . [place], 2000, 2001, 2014  . INGUINAL HERNIA REPAIR Right 08/09/2015   Procedure: HERNIA REPAIR INGUINAL ADULT;  Surgeon: [name], MD;  Location: [facility] ORS;  Service: General;  Laterality: Right;  . NASAL SINUS SURGERY    . nuclear stress test  Prior to Admission medications   Medication Sig Start Date End Date Taking? Authorizing Provider  amLODipine (NORVASC) 5 MG tablet TAKE 1 TABLET BY MOUTH ONCE DAILY 12/04/17   Jerrol Banana., MD  aspirin 81 MG tablet Take 1 mg by mouth daily.  04/03/13   [provider]  atorvastatin (LIPITOR) 40 MG tablet TAKE ONE TABLET BY MOUTH ONCE DAILY 04/16/17   Jerrol Banana., MD  carvedilol (COREG) 12.5 MG tablet TAKE ONE TABLET BY MOUTH TWICE DAILY 07/12/17   Jerrol Banana., MD  clobetasol cream (TEMOVATE) 4.69 % Apply 1 application topically as  needed.  01/10/17   [provider]  docusate sodium (COLACE) 100 MG capsule Take 1 capsule (100 mg total) by mouth 2 (two) times daily. 12/05/17 12/05/18  Loney Hering, MD  empagliflozin (JARDIANCE) 10 MG TABS tablet Take 10 mg by mouth daily. 11/23/17   Virginia Crews, MD  fluticasone (FLONASE) 50 MCG/ACT nasal spray Place 2 sprays into both nostrils daily. 09/19/17   Jerrol Banana., MD  glucose blood (CONTOUR NEXT TEST) test strip Checks sugar 4 times daily. DX E11.9-strips for Contour next EZ meter 05/21/17   Jerrol Banana., MD  Insulin Pen Needle 31G X 5 MM MISC Needs BD ultra fine mini needles to use twice daily DX E11.9 02/22/17   Jerrol Banana., MD  Ivermectin (SOOLANTRA) 1 % CREA Apply topically daily.    [provider]  losartan (COZAAR) 100 MG tablet TAKE ONE TABLET BY MOUTH ONCE DAILY 05/24/17   Jerrol Banana., MD  metFORMIN (GLUCOPHAGE) 1000 MG tablet Take 1 tablet (1,000 mg total) by mouth 2 (two) times daily with a meal. 06/14/17   Jerrol Banana., MD  NON FORMULARY CPAP machine every night-measurement 10    [provider]  nystatin cream (MYCOSTATIN) APPLY  CREAM TOPICALLY TWICE DAILY AROUND  THE  GROIN  AREA Patient taking differently: APPLY  CREAM TOPICALLY TWICE DAILY AROUND  THE  GROIN  AREA as needed 04/10/16   Jerrol Banana., MD  ondansetron (ZOFRAN ODT) 4 MG disintegrating tablet Take 1 tablet (4 mg total) by mouth every 8 (eight) hours as needed for nausea or vomiting. 12/01/17   Earleen Newport, MD  oxyCODONE-acetaminophen (PERCOCET) 5-325 MG tablet Take 1-2 tablets by mouth every 8 (eight) hours as needed. 12/01/17   Earleen Newport, MD  polyethylene glycol [facility]) packet Take 17 g by mouth 2 (two) times daily. 12/05/17   Loney Hering, MD    Allergies Ace inhibitors; Atorvastatin; Invokana [canagliflozin]; Sulfa antibiotics; and Penicillins  Family History  Problem Relation Age  of Onset  . Heart disease Mother   . Cancer Father        Lung and colon cancer  . Heart disease Father   . Emphysema Maternal Grandfather   . Tuberculosis Maternal Grandfather     Social History Social History   Tobacco Use  . Smoking status: Former Smoker    Types: Cigars  . Smokeless tobacco: Former Network engineer Use Topics  . Alcohol use: Not Currently    Alcohol/week: 0.6 - 3.0 oz    Types: 1 Standard drinks or equivalent per week  . Drug use: No    Review of Systems  Constitutional: No fever/chills Eyes: No visual changes. ENT: No sore throat. Cardiovascular: Denies chest pain. Respiratory: Denies shortness of breath. Gastrointestinal:  abdominal pain, nausea, and constipation with  no vomiting.  No diarrhea.   Genitourinary: Negative for dysuria. Musculoskeletal: Negative for back pain. Skin: Negative for rash. Neurological: Negative for headaches, focal weakness or numbness.   ____________________________________________   PHYSICAL EXAM:  VITAL SIGNS: ED Triage Vitals  Enc Vitals Group     BP 12/04/17 2056 (!) 150/89     Pulse Rate 12/04/17 2056 90     Resp 12/04/17 2056 17     Temp 12/04/17 2056 98 F (36.7 C)     Temp Source 12/04/17 2056 Oral     SpO2 12/04/17 2056 98 %     Weight 12/04/17 2057 160 lb (72.6 kg)     Height --      Head Circumference --      Peak Flow --      Pain Score --      Pain Loc --      Pain Edu? --      Excl. in [place]? --     Constitutional: Alert and oriented. Well appearing and in mild distress. Eyes: Conjunctivae are normal. PERRL. EOMI. Head: Atraumatic. Nose: No congestion/rhinnorhea. Mouth/Throat: Mucous membranes are moist.  Oropharynx non-erythematous. Cardiovascular: Normal rate, regular rhythm. Grossly normal heart sounds.  Good peripheral circulation. Respiratory: Normal respiratory effort.  No retractions. Lungs CTAB. Gastrointestinal: Soft and nontender. No distention.  Positive bowel sounds Rectal: No  stool palpated in the vault, stool is brown Musculoskeletal: No lower extremity tenderness nor edema.   Neurologic:  Normal speech and language.  Skin:  Skin is warm, dry and intact.  Psychiatric: Mood and affect are normal.   ____________________________________________   LABS (all labs ordered are listed, but only abnormal results are displayed)  Labs Reviewed - No data to display ____________________________________________  EKG  None ____________________________________________  RADIOLOGY  ED MD interpretation: KUB: Unremarkable bowel gas pattern, no free intra-abdominal air seen, small to moderate amount of stool noted in colon.  Official radiology report(s): Dg Abdomen 1 View  Result Date: 12/05/2017 CLINICAL DATA:  Acute onset of abdominal bloating and constipation. 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 EXAM: ABDOMEN - 1 VIEW COMPARISON:  CT of the abdomen and pelvis performed 12/01/2017 FINDINGS: The visualized bowel gas pattern is unremarkable. A small to moderate amount of stool is noted in the colon; no abnormal dilatation of small bowel loops is seen to suggest small bowel obstruction. No free intra-abdominal air is identified, though evaluation for free air is limited on a single supine view. The visualized osseous structures are within normal limits; the sacroiliac joints are unremarkable in appearance. IMPRESSION: Unremarkable bowel gas pattern; no free intra-abdominal air seen. Small to moderate amount of stool noted in the colon. Electronically Signed   By: Garald Balding M.D.   On: 12/05/2017 02:05    ____________________________________________   PROCEDURES  Procedure(s) performed: None  Procedures  Critical Care performed: No  ____________________________________________   INITIAL IMPRESSION / ASSESSMENT AND PLAN / ED COURSE  As part of my medical decision making, I reviewed the following data within the electronic  MEDICAL RECORD NUMBER Notes from prior ED visits and [place] Controlled Substance Database   This is a 72 year old male who comes into the hospital today with some abdominal pressure and rectal discomfort concerned about constipation.  The patient's symptoms started after he started taking Percocet which is concerning for opiate-induced constipation.  The patient's x-ray shows a small to moderate amount of stool in the colon.  The stool seems to be located in the right  abdomen.  He did not have any stool in the vault.  I do not feel that an enema will help.  The patient's abdomen is not distended and he does not have any other complaints.  I feel that the patient needs to have a good bowel regimen to help combat the effects of the opiate-induced constipation.  I will give the patient some MiraLAX as well as stool softeners and encouraged him to take Metamucil.  The patient will be discharged to follow-up with his primary care physician.      ____________________________________________   FINAL CLINICAL IMPRESSION(S) / ED DIAGNOSES  Final diagnoses:  Other constipation  Rectal pressure     ED Discharge Orders        Ordered    docusate sodium (COLACE) 100 MG capsule  2 times daily     12/05/17 0302    polyethylene glycol (MIRALAX) packet  2 times daily     12/05/17 0302       Note:  This document was prepared using Dragon voice recognition software and may include unintentional dictation errors.    Loney Hering, MD 12/05/17 [phone]

## 2017-12-05 NOTE — Discharge Instructions (Signed)
Please follow-up with the cancer physician as scheduled.  Please make sure you are drinking lots of water and you may take Metamucil up to twice a day.  Please return with any vomiting or worsening pain.

## 2017-12-05 NOTE — ED Notes (Signed)
Patient transported to X-ray 

## 2017-12-07 ENCOUNTER — Encounter: Payer: Self-pay | Admitting: Urology

## 2017-12-07 ENCOUNTER — Other Ambulatory Visit: Payer: Self-pay | Admitting: Radiology

## 2017-12-07 ENCOUNTER — Other Ambulatory Visit: Payer: Self-pay

## 2017-12-07 ENCOUNTER — Encounter
Admission: RE | Admit: 2017-12-07 | Discharge: 2017-12-07 | Disposition: A | Discharge status: Home / Self Care | Payer: Medicare Other | Source: Ambulatory Visit | Attending: Urology | Admitting: Urology

## 2017-12-07 ENCOUNTER — Ambulatory Visit (INDEPENDENT_AMBULATORY_CARE_PROVIDER_SITE_OTHER): Payer: Medicare Other | Admitting: Urology

## 2017-12-07 ENCOUNTER — Ambulatory Visit
Admission: RE | Admit: 2017-12-07 | Discharge: 2017-12-07 | Disposition: A | Discharge status: Home / Self Care | Payer: Medicare Other | Source: Ambulatory Visit | Attending: Urgent Care | Admitting: Urgent Care

## 2017-12-07 ENCOUNTER — Encounter: Payer: Self-pay | Admitting: Hematology and Oncology

## 2017-12-07 ENCOUNTER — Inpatient Hospital Stay: Payer: Medicare Other | Attending: Hematology and Oncology | Admitting: Hematology and Oncology

## 2017-12-07 VITALS — BP 156/69 | HR 78 | Ht 68.0 in | Wt 157.2 lb

## 2017-12-07 DIAGNOSIS — C778 Secondary and unspecified malignant neoplasm of lymph nodes of multiple regions: Secondary | ICD-10-CM

## 2017-12-07 DIAGNOSIS — I700 Atherosclerosis of aorta: Secondary | ICD-10-CM | POA: Diagnosis not present

## 2017-12-07 DIAGNOSIS — C642 Malignant neoplasm of left kidney, except renal pelvis: Secondary | ICD-10-CM | POA: Insufficient documentation

## 2017-12-07 DIAGNOSIS — R102 Pelvic and perineal pain: Secondary | ICD-10-CM | POA: Diagnosis not present

## 2017-12-07 DIAGNOSIS — N281 Cyst of kidney, acquired: Secondary | ICD-10-CM

## 2017-12-07 DIAGNOSIS — Z01812 Encounter for preprocedural laboratory examination: Secondary | ICD-10-CM | POA: Insufficient documentation

## 2017-12-07 DIAGNOSIS — Z800 Family history of malignant neoplasm of digestive organs: Secondary | ICD-10-CM

## 2017-12-07 DIAGNOSIS — Z801 Family history of malignant neoplasm of trachea, bronchus and lung: Secondary | ICD-10-CM | POA: Insufficient documentation

## 2017-12-07 DIAGNOSIS — N400 Enlarged prostate without lower urinary tract symptoms: Secondary | ICD-10-CM | POA: Diagnosis not present

## 2017-12-07 DIAGNOSIS — R109 Unspecified abdominal pain: Secondary | ICD-10-CM | POA: Diagnosis not present

## 2017-12-07 DIAGNOSIS — R634 Abnormal weight loss: Secondary | ICD-10-CM | POA: Diagnosis not present

## 2017-12-07 DIAGNOSIS — J849 Interstitial pulmonary disease, unspecified: Secondary | ICD-10-CM | POA: Insufficient documentation

## 2017-12-07 DIAGNOSIS — I2510 Atherosclerotic heart disease of native coronary artery without angina pectoris: Secondary | ICD-10-CM

## 2017-12-07 DIAGNOSIS — E1140 Type 2 diabetes mellitus with diabetic neuropathy, unspecified: Secondary | ICD-10-CM | POA: Insufficient documentation

## 2017-12-07 DIAGNOSIS — J432 Centrilobular emphysema: Secondary | ICD-10-CM | POA: Diagnosis not present

## 2017-12-07 DIAGNOSIS — Z01818 Encounter for other preprocedural examination: Secondary | ICD-10-CM | POA: Diagnosis not present

## 2017-12-07 DIAGNOSIS — J439 Emphysema, unspecified: Secondary | ICD-10-CM | POA: Diagnosis not present

## 2017-12-07 DIAGNOSIS — K5903 Drug induced constipation: Secondary | ICD-10-CM

## 2017-12-07 DIAGNOSIS — K219 Gastro-esophageal reflux disease without esophagitis: Secondary | ICD-10-CM | POA: Insufficient documentation

## 2017-12-07 DIAGNOSIS — N2889 Other specified disorders of kidney and ureter: Secondary | ICD-10-CM | POA: Diagnosis not present

## 2017-12-07 LAB — URINALYSIS, COMPLETE
Bilirubin, UA: NEGATIVE
NITRITE UA: NEGATIVE
RBC, UA: NEGATIVE
Specific Gravity, UA: 1.02 (ref 1.005–1.030)
UUROB: 2 mg/dL — AB (ref 0.2–1.0)
pH, UA: 5.5 (ref 5.0–7.5)

## 2017-12-07 LAB — MICROSCOPIC EXAMINATION
Epithelial Cells (non renal): NONE SEEN /hpf (ref 0–10)
RBC MICROSCOPIC, UA: NONE SEEN /HPF (ref 0–2)

## 2017-12-07 LAB — SURGICAL PCR SCREEN
MRSA, PCR: NEGATIVE
STAPHYLOCOCCUS AUREUS: NEGATIVE

## 2017-12-07 MED ORDER — OXYCODONE HCL 5 MG PO TABS: 5.0000 mg | ORAL_TABLET | Freq: Four times a day (QID) | ORAL | 0 refills | Status: DC | PRN

## 2017-12-07 NOTE — Patient Instructions (Signed)
Your procedure is scheduled on: Mon 12/10/17 Report to Day Surgery. At 10:30   Remember: Instructions that are not followed completely may result in serious medical risk, up to and including death, or upon the discretion of your surgeon and anesthesiologist your surgery may need to be rescheduled.     _X__ 1. Do not eat food after midnight the night before your procedure.                 No gum chewing or hard candies. You may drink clear liquids up to 2 hours                 before you are scheduled to arrive for your surgery- DO not drink clear                 liquids within 2 hours of the start of your surgery.                 Clear Liquids include:  Water, __X__2.  On the morning of surgery brush your teeth with toothpaste and water, you may rinse your mouth with mouthwash if you wish.  Do not swallow any              toothpaste of mouthwash.     _X__ 3.  No Alcohol for 24 hours before or after surgery.   ___ 4.  Do Not Smoke or use e-cigarettes For 24 Hours Prior to Your Surgery.                 Do not use any chewable tobacco products for at least 6 hours prior to                 surgery.  ____  5.  Bring all medications with you on the day of surgery if instructed.   __x__  6.  Notify your doctor if there is any change in your medical condition      (cold, fever, infections).     Do not wear jewelry, make-up, hairpins, clips or nail polish. Do not wear lotions, powders, or perfumes. You may wear deodorant. Do not shave 48 hours prior to surgery. Men may shave face and neck. Do not bring valuables to the hospital.    [facility] is not responsible for any belongings or valuables.  Contacts, dentures or bridgework may not be worn into surgery. Leave your suitcase in the car. After surgery it may be brought to your room. For patients admitted to the hospital, discharge time is determined by your treatment team.   Patients discharged the day of surgery will  not be allowed to drive home.   Please read over the following fact sheets that you were given:    __x__ Take these medicines the morning of surgery with A SIP OF WATER:    1. amLODipine (NORVASC) 5 MG tablet  2. carvedilol (COREG) 12.5 MG tablet  3. fluticasone (FLONASE) 50 MCG/ACT nasal spray  4.oxyCODONE (OXY IR/ROXICODONE) 5 MG immediate release tablet if needed  5.  6.  ____ Fleet Enema (as directed)   __x__ Use CHG Soap as directed  ____ Use inhalers on the day of surgery  __x__ Stop metformin 2 days prior to surgery after tonight's dose    ____ Take 1/2 of usual insulin dose the night before surgery. No insulin the morning          of surgery.   ____ Continue aspirin on   __x__ Stop Anti-inflammatories  May take tylenol with pain medication up to 4000mg  in 24 hours   ____ Stop supplements until after surgery.    _x___ Bring C-Pap to the hospital.

## 2017-12-07 NOTE — Progress Notes (Signed)
12/07/2017 11:49 AM   Patrick Mcgee 1946-02-20 [id number]  Referring provider: Jerrol Banana., MD [address] [place], [name] [address]  Chief Complaint  Patient presents with  . Follow-up    HPI: 71 year old male who presents today for further evaluation of left lower pole mass in the collecting system concerning for urothelial carcinoma.  He was initially seen and evaluated in the emergency room on 12/01/2017 for lower abdominal pain thought to be related to constipation.  He had a CT scan at that time which revealed a suspicious lower pole filling defect with asymmetric hypoenhancement of the inferior pole cortex of the kidney.  Additionally, there is significant abdominal and pelvic lymphadenopathy including a 2.  6 cm right para celiac node, 1.6 cm left retroperitoneal node, 1.4 cm right retroperitoneal node, 2.1 cm right common iliac node as well as a 1 cm left external iliac node.  He was seen and evaluated in the cancer center earlier today by Dr. [name] at which time a CT of the chest was ordered.  CMP unremarkable.  He had fairly significant weight loss, 25 to 30 pounds over the last few months without trying.  He is symptomatic and has diffuse lower abdominal pain.  Remote cigar smoking history and chewed tobacco.    Retired IT at [employer].  No environmental exposures.  No gross hematuria or significant urinary symptoms.  Remote cardiac history, s/p PCI x 6 last 2015.  Followed by Dr. Thornell Mule, last seen 04/2017.  Takes daily ASA 81 mg.      PMH: Past Medical History:  Diagnosis Date  . Acid reflux   . Anxiety   . Depression   . Diabetes mellitus without complication ([place])   . Hyperlipidemia   . Hypertension   . Myocardial infarction ([place]) 1995  . Sleep apnea     Surgical History: Past Surgical History:  Procedure Laterality Date  . CATARACT EXTRACTION Left   . COLONOSCOPY  2010   [facility]  . COLONOSCOPY WITH PROPOFOL N/A 10/04/2016   Procedure: COLONOSCOPY WITH PROPOFOL;  Surgeon: [name], MD;  Location: [facility] ENDOSCOPY;  Service: Endoscopy;  Laterality: N/A;  . [place], 2000, 2001, 2014  . INGUINAL HERNIA REPAIR Right 08/09/2015   Procedure: HERNIA REPAIR INGUINAL ADULT;  Surgeon: [name], MD;  Location: [facility] ORS;  Service: General;  Laterality: Right;  . NASAL SINUS SURGERY    . nuclear stress test      Home Medications:  Allergies as of 12/07/2017      Reactions   Ace Inhibitors    Other reaction(s): Unknown   Invokana [canagliflozin]    Leg pain   Sulfa Antibiotics Other (See Comments)   Joint pain   Penicillins Rash      Medication List        Accurate as of 12/07/17 11:49 AM. Always use your most recent med list.          amLODipine 5 MG tablet Commonly known as:  NORVASC TAKE 1 TABLET BY MOUTH ONCE DAILY   aspirin 81 MG tablet Take 1 mg by mouth daily.   atorvastatin 40 MG tablet Commonly known as:  LIPITOR TAKE ONE TABLET BY MOUTH ONCE DAILY   carvedilol 12.5 MG tablet Commonly known as:  COREG TAKE ONE TABLET BY MOUTH TWICE DAILY   clobetasol cream 0.05 % Commonly known as:  TEMOVATE Apply 1 application topically as needed.   docusate sodium  100 MG capsule Commonly known as:  COLACE Take 1 capsule (100 mg total) by mouth 2 (two) times daily.   empagliflozin 10 MG Tabs tablet Commonly known as:  JARDIANCE Take 10 mg by mouth daily.   fluticasone 50 MCG/ACT nasal spray Commonly known as:  FLONASE Place 2 sprays into both nostrils daily.   glucose blood test strip Commonly known as:  CONTOUR NEXT TEST Checks sugar 4 times daily. DX E11.9-strips for Contour next EZ meter   Insulin Pen Needle 31G X 5 MM Misc Needs BD ultra fine mini needles to use twice daily DX E11.9   losartan 100 MG tablet Commonly known as:  COZAAR TAKE ONE TABLET BY MOUTH ONCE DAILY   metFORMIN 1000 MG tablet Commonly known as:   GLUCOPHAGE Take 1 tablet (1,000 mg total) by mouth 2 (two) times daily with a meal.   NON FORMULARY CPAP machine every night-measurement 10   nystatin cream Commonly known as:  MYCOSTATIN APPLY  CREAM TOPICALLY TWICE DAILY AROUND  THE  GROIN  AREA   ondansetron 4 MG disintegrating tablet Commonly known as:  ZOFRAN ODT Take 1 tablet (4 mg total) by mouth every 8 (eight) hours as needed for nausea or vomiting.   oxyCODONE 5 MG immediate release tablet Commonly known as:  Oxy IR/ROXICODONE Take 1-2 tablets (5-10 mg total) by mouth every 6 (six) hours as needed for severe pain.   oxyCODONE-acetaminophen 5-325 MG tablet Commonly known as:  PERCOCET Take 1-2 tablets by mouth every 8 (eight) hours as needed.   polyethylene glycol packet Commonly known as:  MIRALAX Take 17 g by mouth 2 (two) times daily.   SOOLANTRA 1 % Crea Generic drug:  Ivermectin Apply topically daily.       Allergies:  Allergies  Allergen Reactions  . Ace Inhibitors     Other reaction(s): Unknown  . Invokana [Canagliflozin]     Leg pain  . Sulfa Antibiotics Other (See Comments)    Joint pain  . Penicillins Rash    Family History: Family History  Problem Relation Age of Onset  . Heart disease Mother   . Cancer Father        Lung and colon cancer  . Heart disease Father   . Emphysema Maternal Grandfather   . Tuberculosis Maternal Grandmother     Social History:  reports that he has never smoked. He has quit using smokeless tobacco. His smokeless tobacco use included chew. He reports that he drank about 0.6 - 3.0 oz of alcohol per week. He reports that he does not use drugs.  ROS: UROLOGY Frequent Urination?: Yes Hard to postpone urination?: No Burning/pain with urination?: No Get up at night to urinate?: Yes Leakage of urine?: No Urine stream starts and stops?: No Trouble starting stream?: No Do you have to strain to urinate?: No Blood in urine?: No Urinary tract infection?:  No Sexually transmitted disease?: No Injury to kidneys or bladder?: No Painful intercourse?: No Weak stream?: No Erection problems?: No Penile pain?: No  Gastrointestinal Nausea?: No Vomiting?: No Indigestion/heartburn?: Yes Diarrhea?: No Constipation?: Yes  Constitutional Fever: No Night sweats?: No Weight loss?: Yes Fatigue?: Yes  Skin Skin rash/lesions?: Yes Itching?: No  Eyes Blurred vision?: No Double vision?: No  Ears/Nose/Throat Sore throat?: No Sinus problems?: No  Hematologic/Lymphatic Swollen glands?: No Easy bruising?: No  Cardiovascular Leg swelling?: No Chest pain?: No  Respiratory Cough?: No Shortness of breath?: No  Endocrine Excessive thirst?: No  Musculoskeletal Back pain?: No Joint  pain?: No  Neurological Headaches?: No Dizziness?: No  Psychologic Depression?: Yes Anxiety?: Yes  Physical Exam: BP (!) 156/69 (BP Location: Left Arm, Patient Position: Sitting, Cuff Size: Large)   Pulse 78   Ht 5\' 8"  (1.727 m)   Wt 157 lb 3.2 oz (71.3 kg)   SpO2 99%   BMI 23.90 kg/m   Constitutional:  Alert and oriented, No acute distress.  Accompanied by wife today. HEENT: [place] AT, moist mucus membranes.  Trachea midline, no masses. Cardiovascular: No clubbing, cyanosis, or edema. Respiratory: Normal respiratory effort, no increased work of breathing. GI: Abdomen is soft, nontender, nondistended, no abdominal masses GU: No CVA tenderness Skin: No rashes, bruises or suspicious lesions. Neurologic: Grossly intact, no focal deficits, moving all 4 extremities. Psychiatric: Normal mood and affect.  Laboratory Data: Lab Results  Component Value Date   WBC 10.6 12/01/2017   HGB 12.6 (L) 12/01/2017   HCT 37.3 (L) 12/01/2017   MCV 87.8 12/01/2017   PLT 302 12/01/2017    Lab Results  Component Value Date   CREATININE 0.58 (L) 12/01/2017    Lab Results  Component Value Date   PSA 0.5 09/30/2013    Lab Results  Component Value Date    HGBA1C 7.9 10/25/2017    Urinalysis Results for orders placed or performed in visit on 12/07/17  Microscopic Examination  Result Value Ref Range   WBC, UA 6-10 (A) 0 - 5 /hpf   RBC, UA None seen 0 - 2 /hpf   Epithelial Cells (non renal) None seen 0 - 10 /hpf   Casts Present (A) None seen /lpf   Cast Type Hyaline casts N/A   Crystals Present (A) N/A   Crystal Type Calcium Oxalate N/A   Mucus, UA Present (A) Not Estab.   Bacteria, UA Few (A) None seen/Few  Urinalysis, Complete  Result Value Ref Range   Specific Gravity, UA 1.020 1.005 - 1.030   pH, UA 5.5 5.0 - 7.5   Color, UA Yellow Yellow   Appearance Ur Cloudy (A) Clear   Leukocytes, UA Trace (A) Negative   Protein, UA 1+ (A) Negative/Trace   Glucose, UA Trace (A) Negative   Ketones, UA Trace (A) Negative   RBC, UA Negative Negative   Bilirubin, UA Negative Negative   Urobilinogen, Ur 2.0 (H) 0.2 - 1.0 mg/dL   Nitrite, UA Negative Negative   Microscopic Examination See below:     Pertinent Imaging: CLINICAL DATA:  Abdominal pain.  EXAM: CT ABDOMEN AND PELVIS WITH CONTRAST  TECHNIQUE: Multidetector CT imaging of the abdomen and pelvis was performed using the standard protocol following bolus administration of intravenous contrast.  CONTRAST:  164mL ISOVUE-300 IOPAMIDOL (ISOVUE-300) INJECTION 61%  COMPARISON:  None  FINDINGS: Lower chest: No pleural effusion.  The lung bases appear clear.  Hepatobiliary: No focal liver abnormality. The gallbladder appears collapsed. Mild pericholecystic haziness is identified. No gallstones or biliary dilatation identified.  Pancreas: There is diffuse fatty replacement of the pancreatic parenchyma.  Spleen: Normal in size without focal abnormality.  Adrenals/Urinary Tract: Normal adrenal glands.  Multiple simple appearing right kidney cysts noted. Simple appearing cyst within the upper pole of left kidney noted. There is a suspicious enhancing filling defect  within the lower pole collecting system of the left kidney, worrisome for urothelial neoplasm. Hypoenhancement of the inferior pole cortex of the left kidney identified. Findings are worrisome for urothelial neoplasm with possible invasion of the lower pole cortex of the left kidney. No focal bladder abnormality.  Stomach/Bowel: Stomach is normal. The small bowel loops have a normal course and caliber. No bowel obstruction. No pathologic dilatation of the colon.  Vascular/Lymphatic: Aortic atherosclerosis without aneurysm. Extensive adenopathy is identified within the upper abdomen. Index right para celiac node measures 2.6 cm, image 22/2. Index left retroperitoneal node measures 1.6 cm, image 36/2. Index right retroperitoneal, aortocaval node measures 1.4 cm, image 34/2. Bilateral common iliac adenopathy identified. Index right common iliac node measures 2.1 cm, image 48/2. Left external iliac node is borderline enlarged measuring 1 cm, image 69/2.  Reproductive: Prostate gland measures 4.4 by 5.7 by 4.7 cm (volume = 62 cm^3).  Other: There is trace soft tissue stranding along the peritoneal reflections of the upper abdomen bilaterally. No definite peritoneal nodularity identified.  Musculoskeletal: No acute or significant osseous findings.  IMPRESSION: 1. Suspicious enhancing, filling defect involving the lower pole collecting system of the left kidney with asymmetric hypoenhancement of the inferior pole cortex of left kidney noted. Findings worrisome for urothelial neoplasm. 2. Abdominal and pelvic adenopathy compatible with nodal metastasis. 3.  Aortic Atherosclerosis (ICD10-I70.0). 4. Kidney cysts. 5. Prostate gland enlargement.   Electronically Signed   By: Kerby Moors M.D.   On: 12/01/2017 10:12  CT scan today with the patient.  Assessment & Plan:    1. Urothelial carcinoma of kidney, left (HCC) CT scan findings highly concerning for metastatic  urothelial carcinoma with a primary involving the left kidney.  Further staging with CT chest scan already scheduled  I recommended proceeding to the operating room in the very near future for left ureteroscopy, renal pelvic biopsy, bilateral retrogrades, and ureteral stent placement. Risks and benefits of ureteroscopy were reviewed including but not limited to infection, bleeding, pain, ureteral injury which could require open surgery versus prolonged indwelling if ureteral perforation occurs, requirement for staged procedure, possible stent, and global anesthesia risks. Patient expressed understanding and desires to proceed with ureteroscopy.  He will need cardiac clearance prior to surgery.  He may continue his on a 1 mg for the procedure given his extensive cardiac history.  Case was discussed today with Dr. [name] personally who will be facilitated chemotherapy once we have a tissue diagnosis.  All questions answered.  - Urinalysis, Complete - CULTURE, URINE COMPREHENSIVE   [name], MD  [facility] [address], [place], [place] [address] [phone]  I spent 45 min with this patient of which greater than 50% was spent in counseling and coordination of care with the patient.

## 2017-12-07 NOTE — H&P (View-Only) (Signed)
12/07/2017 11:49 AM   Patrick Mcgee 1945-09-02 [id number]  Referring provider: Jerrol Banana., MD [address] [place], [place] [address]  Chief Complaint  Patient presents with  . Follow-up    HPI: 72 year old male who presents today for further evaluation of left lower pole mass in the collecting system concerning for urothelial carcinoma.  He was initially seen and evaluated in the emergency room on 12/01/2017 for lower abdominal pain thought to be related to constipation.  He had a CT scan at that time which revealed a suspicious lower pole filling defect with asymmetric hypoenhancement of the inferior pole cortex of the kidney.  Additionally, there is significant abdominal and pelvic lymphadenopathy including a 2.  6 cm right para celiac node, 1.6 cm left retroperitoneal node, 1.4 cm right retroperitoneal node, 2.1 cm right common iliac node as well as a 1 cm left external iliac node.  He was seen and evaluated in the cancer center earlier today by Dr. [name] at which time a CT of the chest was ordered.  CMP unremarkable.  He had fairly significant weight loss, 25 to 30 pounds over the last few months without trying.  He is symptomatic and has diffuse lower abdominal pain.  Remote cigar smoking history and chewed tobacco.    Retired IT at [employer].  No environmental exposures.  No gross hematuria or significant urinary symptoms.  Remote cardiac history, s/p PCI x 6 last 2015.  Followed by Dr. Thornell Mule, last seen 04/2017.  Takes daily ASA 81 mg.      PMH: Past Medical History:  Diagnosis Date  . Acid reflux   . Anxiety   . Depression   . Diabetes mellitus without complication ([place])   . Hyperlipidemia   . Hypertension   . Myocardial infarction ([place]) 1995  . Sleep apnea     Surgical History: Past Surgical History:  Procedure Laterality Date  . CATARACT EXTRACTION Left   . COLONOSCOPY  2010   [facility]  . COLONOSCOPY WITH PROPOFOL N/A 10/04/2016   Procedure: COLONOSCOPY WITH PROPOFOL;  Surgeon: [name], MD;  Location: [facility] ENDOSCOPY;  Service: Endoscopy;  Laterality: N/A;  . [place], 2000, 2001, 2014  . INGUINAL HERNIA REPAIR Right 08/09/2015   Procedure: HERNIA REPAIR INGUINAL ADULT;  Surgeon: [name], MD;  Location: [facility] ORS;  Service: General;  Laterality: Right;  . NASAL SINUS SURGERY    . nuclear stress test      Home Medications:  Allergies as of 12/07/2017      Reactions   Ace Inhibitors    Other reaction(s): Unknown   Invokana [canagliflozin]    Leg pain   Sulfa Antibiotics Other (See Comments)   Joint pain   Penicillins Rash      Medication List        Accurate as of 12/07/17 11:49 AM. Always use your most recent med list.          amLODipine 5 MG tablet Commonly known as:  NORVASC TAKE 1 TABLET BY MOUTH ONCE DAILY   aspirin 81 MG tablet Take 1 mg by mouth daily.   atorvastatin 40 MG tablet Commonly known as:  LIPITOR TAKE ONE TABLET BY MOUTH ONCE DAILY   carvedilol 12.5 MG tablet Commonly known as:  COREG TAKE ONE TABLET BY MOUTH TWICE DAILY   clobetasol cream 0.05 % Commonly known as:  TEMOVATE Apply 1 application topically as needed.   docusate sodium  100 MG capsule Commonly known as:  COLACE Take 1 capsule (100 mg total) by mouth 2 (two) times daily.   empagliflozin 10 MG Tabs tablet Commonly known as:  JARDIANCE Take 10 mg by mouth daily.   fluticasone 50 MCG/ACT nasal spray Commonly known as:  FLONASE Place 2 sprays into both nostrils daily.   glucose blood test strip Commonly known as:  CONTOUR NEXT TEST Checks sugar 4 times daily. DX E11.9-strips for Contour next EZ meter   Insulin Pen Needle 31G X 5 MM Misc Needs BD ultra fine mini needles to use twice daily DX E11.9   losartan 100 MG tablet Commonly known as:  COZAAR TAKE ONE TABLET BY MOUTH ONCE DAILY   metFORMIN 1000 MG tablet Commonly known as:   GLUCOPHAGE Take 1 tablet (1,000 mg total) by mouth 2 (two) times daily with a meal.   NON FORMULARY CPAP machine every night-measurement 10   nystatin cream Commonly known as:  MYCOSTATIN APPLY  CREAM TOPICALLY TWICE DAILY AROUND  THE  GROIN  AREA   ondansetron 4 MG disintegrating tablet Commonly known as:  ZOFRAN ODT Take 1 tablet (4 mg total) by mouth every 8 (eight) hours as needed for nausea or vomiting.   oxyCODONE 5 MG immediate release tablet Commonly known as:  Oxy IR/ROXICODONE Take 1-2 tablets (5-10 mg total) by mouth every 6 (six) hours as needed for severe pain.   oxyCODONE-acetaminophen 5-325 MG tablet Commonly known as:  PERCOCET Take 1-2 tablets by mouth every 8 (eight) hours as needed.   polyethylene glycol packet Commonly known as:  MIRALAX Take 17 g by mouth 2 (two) times daily.   SOOLANTRA 1 % Crea Generic drug:  Ivermectin Apply topically daily.       Allergies:  Allergies  Allergen Reactions  . Ace Inhibitors     Other reaction(s): Unknown  . Invokana [Canagliflozin]     Leg pain  . Sulfa Antibiotics Other (See Comments)    Joint pain  . Penicillins Rash    Family History: Family History  Problem Relation Age of Onset  . Heart disease Mother   . Cancer Father        Lung and colon cancer  . Heart disease Father   . Emphysema Maternal Grandfather   . Tuberculosis Maternal Grandmother     Social History:  reports that he has never smoked. He has quit using smokeless tobacco. His smokeless tobacco use included chew. He reports that he drank about 0.6 - 3.0 oz of alcohol per week. He reports that he does not use drugs.  ROS: UROLOGY Frequent Urination?: Yes Hard to postpone urination?: No Burning/pain with urination?: No Get up at night to urinate?: Yes Leakage of urine?: No Urine stream starts and stops?: No Trouble starting stream?: No Do you have to strain to urinate?: No Blood in urine?: No Urinary tract infection?:  No Sexually transmitted disease?: No Injury to kidneys or bladder?: No Painful intercourse?: No Weak stream?: No Erection problems?: No Penile pain?: No  Gastrointestinal Nausea?: No Vomiting?: No Indigestion/heartburn?: Yes Diarrhea?: No Constipation?: Yes  Constitutional Fever: No Night sweats?: No Weight loss?: Yes Fatigue?: Yes  Skin Skin rash/lesions?: Yes Itching?: No  Eyes Blurred vision?: No Double vision?: No  Ears/Nose/Throat Sore throat?: No Sinus problems?: No  Hematologic/Lymphatic Swollen glands?: No Easy bruising?: No  Cardiovascular Leg swelling?: No Chest pain?: No  Respiratory Cough?: No Shortness of breath?: No  Endocrine Excessive thirst?: No  Musculoskeletal Back pain?: No Joint  pain?: No  Neurological Headaches?: No Dizziness?: No  Psychologic Depression?: Yes Anxiety?: Yes  Physical Exam: BP (!) 156/69 (BP Location: Left Arm, Patient Position: Sitting, Cuff Size: Large)   Pulse 78   Ht 5\' 8"  (1.727 m)   Wt 157 lb 3.2 oz (71.3 kg)   SpO2 99%   BMI 23.90 kg/m   Constitutional:  Alert and oriented, No acute distress.  Accompanied by wife today. HEENT: [place] AT, moist mucus membranes.  Trachea midline, no masses. Cardiovascular: No clubbing, cyanosis, or edema. Respiratory: Normal respiratory effort, no increased work of breathing. GI: Abdomen is soft, nontender, nondistended, no abdominal masses GU: No CVA tenderness Skin: No rashes, bruises or suspicious lesions. Neurologic: Grossly intact, no focal deficits, moving all 4 extremities. Psychiatric: Normal mood and affect.  Laboratory Data: Lab Results  Component Value Date   WBC 10.6 12/01/2017   HGB 12.6 (L) 12/01/2017   HCT 37.3 (L) 12/01/2017   MCV 87.8 12/01/2017   PLT 302 12/01/2017    Lab Results  Component Value Date   CREATININE 0.58 (L) 12/01/2017    Lab Results  Component Value Date   PSA 0.5 09/30/2013    Lab Results  Component Value Date    HGBA1C 7.9 10/25/2017    Urinalysis Results for orders placed or performed in visit on 12/07/17  Microscopic Examination  Result Value Ref Range   WBC, UA 6-10 (A) 0 - 5 /hpf   RBC, UA None seen 0 - 2 /hpf   Epithelial Cells (non renal) None seen 0 - 10 /hpf   Casts Present (A) None seen /lpf   Cast Type Hyaline casts N/A   Crystals Present (A) N/A   Crystal Type Calcium Oxalate N/A   Mucus, UA Present (A) Not Estab.   Bacteria, UA Few (A) None seen/Few  Urinalysis, Complete  Result Value Ref Range   Specific Gravity, UA 1.020 1.005 - 1.030   pH, UA 5.5 5.0 - 7.5   Color, UA Yellow Yellow   Appearance Ur Cloudy (A) Clear   Leukocytes, UA Trace (A) Negative   Protein, UA 1+ (A) Negative/Trace   Glucose, UA Trace (A) Negative   Ketones, UA Trace (A) Negative   RBC, UA Negative Negative   Bilirubin, UA Negative Negative   Urobilinogen, Ur 2.0 (H) 0.2 - 1.0 mg/dL   Nitrite, UA Negative Negative   Microscopic Examination See below:     Pertinent Imaging: CLINICAL DATA:  Abdominal pain.  EXAM: CT ABDOMEN AND PELVIS WITH CONTRAST  TECHNIQUE: Multidetector CT imaging of the abdomen and pelvis was performed using the standard protocol following bolus administration of intravenous contrast.  CONTRAST:  117mL ISOVUE-300 IOPAMIDOL (ISOVUE-300) INJECTION 61%  COMPARISON:  None  FINDINGS: Lower chest: No pleural effusion.  The lung bases appear clear.  Hepatobiliary: No focal liver abnormality. The gallbladder appears collapsed. Mild pericholecystic haziness is identified. No gallstones or biliary dilatation identified.  Pancreas: There is diffuse fatty replacement of the pancreatic parenchyma.  Spleen: Normal in size without focal abnormality.  Adrenals/Urinary Tract: Normal adrenal glands.  Multiple simple appearing right kidney cysts noted. Simple appearing cyst within the upper pole of left kidney noted. There is a suspicious enhancing filling defect  within the lower pole collecting system of the left kidney, worrisome for urothelial neoplasm. Hypoenhancement of the inferior pole cortex of the left kidney identified. Findings are worrisome for urothelial neoplasm with possible invasion of the lower pole cortex of the left kidney. No focal bladder abnormality.  Stomach/Bowel: Stomach is normal. The small bowel loops have a normal course and caliber. No bowel obstruction. No pathologic dilatation of the colon.  Vascular/Lymphatic: Aortic atherosclerosis without aneurysm. Extensive adenopathy is identified within the upper abdomen. Index right para celiac node measures 2.6 cm, image 22/2. Index left retroperitoneal node measures 1.6 cm, image 36/2. Index right retroperitoneal, aortocaval node measures 1.4 cm, image 34/2. Bilateral common iliac adenopathy identified. Index right common iliac node measures 2.1 cm, image 48/2. Left external iliac node is borderline enlarged measuring 1 cm, image 69/2.  Reproductive: Prostate gland measures 4.4 by 5.7 by 4.7 cm (volume = 62 cm^3).  Other: There is trace soft tissue stranding along the peritoneal reflections of the upper abdomen bilaterally. No definite peritoneal nodularity identified.  Musculoskeletal: No acute or significant osseous findings.  IMPRESSION: 1. Suspicious enhancing, filling defect involving the lower pole collecting system of the left kidney with asymmetric hypoenhancement of the inferior pole cortex of left kidney noted. Findings worrisome for urothelial neoplasm. 2. Abdominal and pelvic adenopathy compatible with nodal metastasis. 3.  Aortic Atherosclerosis (ICD10-I70.0). 4. Kidney cysts. 5. Prostate gland enlargement.   Electronically Signed   By: Kerby Moors M.D.   On: 12/01/2017 10:12  CT scan today with the patient.  Assessment & Plan:    1. Urothelial carcinoma of kidney, left (HCC) CT scan findings highly concerning for metastatic  urothelial carcinoma with a primary involving the left kidney.  Further staging with CT chest scan already scheduled  I recommended proceeding to the operating room in the very near future for left ureteroscopy, renal pelvic biopsy, bilateral retrogrades, and ureteral stent placement. Risks and benefits of ureteroscopy were reviewed including but not limited to infection, bleeding, pain, ureteral injury which could require open surgery versus prolonged indwelling if ureteral perforation occurs, requirement for staged procedure, possible stent, and global anesthesia risks. Patient expressed understanding and desires to proceed with ureteroscopy.  He will need cardiac clearance prior to surgery.  He may continue his on a 1 mg for the procedure given his extensive cardiac history.  Case was discussed today with Dr. [name] personally who will be facilitated chemotherapy once we have a tissue diagnosis.  All questions answered.  - Urinalysis, Complete - CULTURE, URINE COMPREHENSIVE   [name], MD  [place] [address] [place],  [address] [phone]  I spent 45 min with this patient of which greater than 50% was spent in counseling and coordination of care with the patient.

## 2017-12-07 NOTE — Progress Notes (Signed)
[facility] day:  12/07/2017  Chief Complaint: [place] Mcgee is a 72 y.o. male with imaging concerning for urothelial carcinoma who is referred in consultation by Dr. Lenise Arena for assessment and management.  HPI:  Patient with complaints of lower abdominal pain that began around 11/22/2017. Pain increased to the point where the patient had to present to the [facility] ER on 12/01/2017 with a 2 week history of abdominal pain.  Pain was worse on the right side.  He also described nausea and vomiting x 3.  CBC revealed a hematocrit of 37.3, hemoglobin 12.6, and MCV 87.8.  Creatinine was 0.58.  LFTs were normal.  Abdomen and pelvic CT on 12/01/2017 revealed a suspicious enhancing, filling defect involving the lower pole collecting system of the left kidney with asymmetric hypoenhancement of the inferior pole cortex of left kidney noted.  Findings were worrisome for urothelial neoplasm. There was abdominal and pelvic adenopathy compatible with nodal metastasis.  There was a 2.6 cm right para celiac node, 1.6 cm left retroperitoneal node, 1.4 cm right retroperitoneal node, 2.1 cm right common iliac node and 1 cm left external iliac node.  There was aortic atherosclerosis, kidney cysts, and prostate gland enlargement.  He was seen in the [facility] ER on 12/05/2017 with narcotic induced constipation.  He has not had a bowel movement in 6-7 days.  KUB revealed a small to moderate amount of stool in the colon. He was prescribed Miralax, stool softeners, and Metamucil.   Symptomatically, he notes significant pain. His pain has been progressive. He has no appetite. Patient's advising that his bowels have improved with the interventions recommended in the ER on 12/05/2017. Patient notes excess gas. He notes that he "belches and tastes" his food over and over. Patient wife states, "he is losing weight everyday".   Patient denies any fevers and sweats. He has had allergy  symptoms "all of his life". He denies nausea and vomiting. He has been dealing with opioid induced constipation. He denies urinary symptoms. Patient denies bleeding; no hematochezia, melena, or gross hematuria.   Pain rated 8/10 (abdomin and pelvis) in the clinic today.    Past Medical History:  Diagnosis Date  . Diabetes mellitus without complication ([place])   . Hyperlipidemia   . Hypertension   . Myocardial infarction ([place]) 1995    Past Surgical History:  Procedure Laterality Date  . CATARACT EXTRACTION Left   . COLONOSCOPY  2010   [facility]  . COLONOSCOPY WITH PROPOFOL N/A 10/04/2016   Procedure: COLONOSCOPY WITH PROPOFOL;  Surgeon: [name], MD;  Location: [facility] ENDOSCOPY;  Service: Endoscopy;  Laterality: N/A;  . [place], 2000, 2001, 2014  . INGUINAL HERNIA REPAIR Right 08/09/2015   Procedure: HERNIA REPAIR INGUINAL ADULT;  Surgeon: [name], MD;  Location: [facility] ORS;  Service: General;  Laterality: Right;  . NASAL SINUS SURGERY    . nuclear stress test      Family History  Problem Relation Age of Onset  . Heart disease Mother   . Cancer Father        Lung and colon cancer  . Heart disease Father   . Emphysema Maternal Grandfather   . Tuberculosis Maternal Grandmother     Social History:  reports that he has never smoked. He has quit using smokeless tobacco. His smokeless tobacco use included chew. He reports that he drank about 0.6 - 3.0 oz of alcohol per  week. He reports that he does not use drugs.  Patient is retired from the [employer]. Patient denies known exposures to radiation on toxins. The patient is accompanied by his wife, [name],  today.  Allergies:  Allergies  Allergen Reactions  . Ace Inhibitors     Other reaction(s): Unknown  . Invokana [Canagliflozin]     Leg pain  . Sulfa Antibiotics Other (See Comments)    Joint pain  . Penicillins Rash    Current Medications: Current Outpatient Medications   Medication Sig Dispense Refill  . amLODipine (NORVASC) 5 MG tablet TAKE 1 TABLET BY MOUTH ONCE DAILY 90 tablet 3  . aspirin 81 MG tablet Take 1 mg by mouth daily.     Marland Kitchen atorvastatin (LIPITOR) 40 MG tablet TAKE ONE TABLET BY MOUTH ONCE DAILY 90 tablet 3  . carvedilol (COREG) 12.5 MG tablet TAKE ONE TABLET BY MOUTH TWICE DAILY 180 tablet 3  . clobetasol cream (TEMOVATE) 6.22 % Apply 1 application topically as needed.     . docusate sodium (COLACE) 100 MG capsule Take 1 capsule (100 mg total) by mouth 2 (two) times daily. 20 capsule 0  . empagliflozin (JARDIANCE) 10 MG TABS tablet Take 10 mg by mouth daily. 30 tablet 0  . fluticasone (FLONASE) 50 MCG/ACT nasal spray Place 2 sprays into both nostrils daily. 16 g 12  . glucose blood (CONTOUR NEXT TEST) test strip Checks sugar 4 times daily. DX E11.9-strips for Contour next EZ meter 360 each 3  . Insulin Pen Needle 31G X 5 MM MISC Needs BD ultra fine mini needles to use twice daily DX E11.9 100 each 3  . Ivermectin (SOOLANTRA) 1 % CREA Apply topically daily.    Marland Kitchen losartan (COZAAR) 100 MG tablet TAKE ONE TABLET BY MOUTH ONCE DAILY 90 tablet 3  . metFORMIN (GLUCOPHAGE) 1000 MG tablet Take 1 tablet (1,000 mg total) by mouth 2 (two) times daily with a meal. 180 tablet 3  . NON FORMULARY CPAP machine every night-measurement 10    . nystatin cream (MYCOSTATIN) APPLY  CREAM TOPICALLY TWICE DAILY AROUND  THE  GROIN  AREA (Patient taking differently: APPLY  CREAM TOPICALLY TWICE DAILY AROUND  THE  GROIN  AREA as needed) 15 g 1  . ondansetron (ZOFRAN ODT) 4 MG disintegrating tablet Take 1 tablet (4 mg total) by mouth every 8 (eight) hours as needed for nausea or vomiting. 20 tablet 0  . oxyCODONE-acetaminophen (PERCOCET) 5-325 MG tablet Take 1-2 tablets by mouth every 8 (eight) hours as needed. 20 tablet 0  . polyethylene glycol (MIRALAX) packet Take 17 g by mouth 2 (two) times daily. 14 each 0   No current facility-administered medications for this visit.     Review of Systems  Constitutional: Positive for malaise/fatigue and weight loss (poor appetite). Negative for diaphoresis and fever.  HENT: Negative.   Eyes: Negative.   Respiratory: Negative for cough, hemoptysis, sputum production and shortness of breath.   Cardiovascular: Negative for chest pain, palpitations, orthopnea, leg swelling and PND.       MI s/p stent placement x 5 or 6 (last 2015).   Gastrointestinal: Positive for constipation. Negative for abdominal pain, blood in stool, diarrhea, melena, nausea and vomiting.       Reflux.  Last colonoscopy was in 2018 - "Dr. Vira Agar said I had a boring colon".   Genitourinary: Negative for dysuria, frequency, hematuria and urgency.  Musculoskeletal: Negative for back pain, falls, joint pain and myalgias.  Skin: Negative for  itching and rash.  Neurological: Positive for tingling (diabetic neuropathy in feet). Negative for dizziness, tremors, weakness and headaches.  Endo/Heme/Allergies: Positive for environmental allergies (seasonal allergies - "all of my life"). Does not bruise/bleed easily.       T2DM  Psychiatric/Behavioral: Negative for depression, memory loss and suicidal ideas. The patient is not nervous/anxious and does not have insomnia.   All other systems reviewed and are negative.  Performance status (ECOG): 2 - Symptomatic, <50% confined to bed  Physical Exam: Blood pressure 117/76, pulse 75, temperature (!) 97.4 F (36.3 C), temperature source Tympanic, resp. rate 18, height '5\' 8"'$  (1.727 m), weight 157 lb 3.2 oz (71.3 kg), SpO2 96 %. GENERAL:  Well developed, well nourished, gentleman sitting comfortably in the exam room in no acute distress. MENTAL STATUS:  Alert and oriented to person, place and time. HEAD:  Thin white hair.  Normocephalic, atraumatic, face symmetric, no Cushingoid features. EYES:  Brown eyes.  Pupils equal round and reactive to light and accomodation.  No conjunctivitis or scleral icterus. ENT:   Oropharynx clear without lesion.  Tongue normal. Mucous membranes moist.  RESPIRATORY:  Clear to auscultation without rales, wheezes or rhonchi. CARDIOVASCULAR:  Regular rate and rhythm without murmur, rub or gallop. ABDOMEN:  Soft, non-tender, with active bowel sounds, and no hepatosplenomegaly.  No masses. SKIN:  No rashes, ulcers or lesions. EXTREMITIES: No edema, no skin discoloration or tenderness.  No palpable cords. LYMPH NODES: No palpable cervical, supraclavicular, axillary or inguinal adenopathy  NEUROLOGICAL: Unremarkable. PSYCH:  Appropriate.   No visits with results within 3 Day(s) from this visit.  Latest known visit with results is:  Admission on 12/01/2017, Discharged on 12/01/2017  Component Date Value Ref Range Status  . Lipase 12/01/2017 18  11 - 51 U/L Final   Performed at [facility], [address] [place], [place] [address]  . Sodium 12/01/2017 133* 135 - 145 mmol/L Final  . Potassium 12/01/2017 4.2  3.5 - 5.1 mmol/L Final  . Chloride 12/01/2017 97* 101 - 111 mmol/L Final  . CO2 12/01/2017 28  22 - 32 mmol/L Final  . Glucose, Bld 12/01/2017 167* 65 - 99 mg/dL Final  . BUN 12/01/2017 15  6 - 20 mg/dL Final  . Creatinine, Ser 12/01/2017 0.58* 0.61 - 1.24 mg/dL Final  . Calcium 12/01/2017 10.0  8.9 - 10.3 mg/dL Final  . Total Protein 12/01/2017 7.9  6.5 - 8.1 g/dL Final  . Albumin 12/01/2017 3.9  3.5 - 5.0 g/dL Final  . AST 12/01/2017 22  15 - 41 U/L Final  . ALT 12/01/2017 14* 17 - 63 U/L Final  . Alkaline Phosphatase 12/01/2017 86  38 - 126 U/L Final  . Total Bilirubin 12/01/2017 1.1  0.3 - 1.2 mg/dL Final  . GFR calc non Af Amer 12/01/2017 >60  >60 mL/min Final  . GFR calc Af Amer 12/01/2017 >60  >60 mL/min Final   Comment: (NOTE) The eGFR has been calculated using the CKD EPI equation. This calculation has not been validated in all clinical situations. eGFR's persistently <60 mL/min signify possible Chronic Kidney Disease.   Georgiann Hahn gap  12/01/2017 8  5 - 15 Final   Performed at [facility], [address] [place], [place] [address]  . WBC 12/01/2017 10.6  3.8 - 10.6 K/uL Final  . RBC 12/01/2017 4.25* 4.40 - 5.90 MIL/uL Final  . Hemoglobin 12/01/2017 12.6* 13.0 - 18.0 g/dL Final  . HCT 12/01/2017 37.3* 40.0 - 52.0 %  Final  . MCV 12/01/2017 87.8  80.0 - 100.0 fL Final  . MCH 12/01/2017 29.7  26.0 - 34.0 pg Final  . MCHC 12/01/2017 33.8  32.0 - 36.0 g/dL Final  . RDW 12/01/2017 14.3  11.5 - 14.5 % Final  . Platelets 12/01/2017 302  150 - 440 K/uL Final   Performed at [facility], [address] [place], [place] [address]    Assessment:  BRIANNA ESSON is a 72 y.o. 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  He presented with abdominal pain and weight loss.  Abdomen and pelvic CT on 12/01/2017 revealed a suspicious enhancing, filling defect involving the lower pole collecting system of the left kidney with asymmetric hypoenhancement of the inferior pole cortex of left kidney noted. Findings were worrisome for urothelial neoplasm.  There was abdominal and pelvic adenopathy compatible with nodal metastasis.  There was a 2.6 cm right para celiac node, 1,6 cm left retroperitoneal node, 1.4 cm right retroperitoneal node, 2.1 cm right common iliac node and 1 cm left external iliac node.  There was aortic atherosclerosis, kidney cysts, and prostate gland enlargement.  He has diabetes and coronary artery disease s/p MI in 1995.  He is s/p stent plaecment x 5-6.  Colonoscopy in 2018 was negative.  Symptomatically, he has weight loss, abdominal pain, reflux, and bloating.  Exam reveals no palpable masses.  Plan: 1.  Discuss concern for metastatic urothelial carcinoma.  Discuss plan for obtaining tissue for diagnosis.  Discuss referral to urology for likely ureteroscopy and biopsy.  Discuss preliminary plan for chemotherapy (carboplatin/cisplatin +  gemcitabine).   2.  Discuss abdominal pain.  Continue short acting pain medications with transition to long acting pain medications depending on use.   3.  Rx: oxycodone 5 mg po q 6 hours prn pain (dis: # 40). 4.  Discuss management of constipation, reflux, and bloating. 5.  Urine cytology. 6.  Chest CT without contrast. 7.  Referral to urology (Dr. Erlene Quan) today.  Communication with Dr. Erlene Quan today.  He has an appointment this morning. 8.  RTC after CT and biopsy for MD assessment and discussion regarding direction of therapy.   Honor Loh, NP  12/07/2017, 9:35 AM   I saw and evaluated the patient, participating in the key portions of the service and reviewing pertinent diagnostic studies and records.  I reviewed the nurse practitioner's note and agree with the findings and the plan.  The assessment and plan were discussed with the patient. Multiple questions were asked by the patient and answered.   Nolon Stalls, MD 12/07/2017,9:35 AM

## 2017-12-09 MED ORDER — CIPROFLOXACIN IN D5W 400 MG/200ML IV SOLN
400.0000 mg | INTRAVENOUS | Status: AC
  Administered 2017-12-10: 400 mg via INTRAVENOUS

## 2017-12-10 ENCOUNTER — Other Ambulatory Visit: Payer: Self-pay

## 2017-12-10 ENCOUNTER — Ambulatory Visit: Payer: Medicare Other | Admitting: Certified Registered Nurse Anesthetist

## 2017-12-10 ENCOUNTER — Telehealth: Payer: Self-pay | Admitting: *Deleted

## 2017-12-10 ENCOUNTER — Ambulatory Visit
Admission: RE | Admit: 2017-12-10 | Discharge: 2017-12-10 | Disposition: A | Discharge status: Home / Self Care | Payer: Medicare Other | Source: Ambulatory Visit | Attending: Urology | Admitting: Urology

## 2017-12-10 ENCOUNTER — Encounter: Payer: Self-pay | Admitting: *Deleted

## 2017-12-10 ENCOUNTER — Encounter
Admission: RE | Disposition: A | Discharge status: Home / Self Care | Payer: Self-pay | Source: Ambulatory Visit | Attending: Urology

## 2017-12-10 DIAGNOSIS — Z794 Long term (current) use of insulin: Secondary | ICD-10-CM | POA: Insufficient documentation

## 2017-12-10 DIAGNOSIS — I252 Old myocardial infarction: Secondary | ICD-10-CM | POA: Diagnosis not present

## 2017-12-10 DIAGNOSIS — Z87891 Personal history of nicotine dependence: Secondary | ICD-10-CM | POA: Diagnosis not present

## 2017-12-10 DIAGNOSIS — E785 Hyperlipidemia, unspecified: Secondary | ICD-10-CM | POA: Insufficient documentation

## 2017-12-10 DIAGNOSIS — C652 Malignant neoplasm of left renal pelvis: Secondary | ICD-10-CM | POA: Diagnosis not present

## 2017-12-10 DIAGNOSIS — I2510 Atherosclerotic heart disease of native coronary artery without angina pectoris: Secondary | ICD-10-CM | POA: Insufficient documentation

## 2017-12-10 DIAGNOSIS — Z7951 Long term (current) use of inhaled steroids: Secondary | ICD-10-CM | POA: Diagnosis not present

## 2017-12-10 DIAGNOSIS — E039 Hypothyroidism, unspecified: Secondary | ICD-10-CM | POA: Diagnosis not present

## 2017-12-10 DIAGNOSIS — Z7982 Long term (current) use of aspirin: Secondary | ICD-10-CM | POA: Insufficient documentation

## 2017-12-10 DIAGNOSIS — N2889 Other specified disorders of kidney and ureter: Secondary | ICD-10-CM

## 2017-12-10 DIAGNOSIS — D4112 Neoplasm of uncertain behavior of left renal pelvis: Secondary | ICD-10-CM | POA: Diagnosis not present

## 2017-12-10 DIAGNOSIS — E119 Type 2 diabetes mellitus without complications: Secondary | ICD-10-CM | POA: Insufficient documentation

## 2017-12-10 DIAGNOSIS — F418 Other specified anxiety disorders: Secondary | ICD-10-CM | POA: Diagnosis not present

## 2017-12-10 DIAGNOSIS — I10 Essential (primary) hypertension: Secondary | ICD-10-CM | POA: Insufficient documentation

## 2017-12-10 DIAGNOSIS — Z79899 Other long term (current) drug therapy: Secondary | ICD-10-CM | POA: Diagnosis not present

## 2017-12-10 DIAGNOSIS — Z955 Presence of coronary angioplasty implant and graft: Secondary | ICD-10-CM | POA: Diagnosis not present

## 2017-12-10 DIAGNOSIS — G4730 Sleep apnea, unspecified: Secondary | ICD-10-CM | POA: Diagnosis not present

## 2017-12-10 DIAGNOSIS — C642 Malignant neoplasm of left kidney, except renal pelvis: Secondary | ICD-10-CM | POA: Diagnosis not present

## 2017-12-10 DIAGNOSIS — K219 Gastro-esophageal reflux disease without esophagitis: Secondary | ICD-10-CM | POA: Diagnosis not present

## 2017-12-10 HISTORY — PX: CYSTOSCOPY WITH STENT PLACEMENT: SHX5790

## 2017-12-10 HISTORY — PX: URETEROSCOPY: SHX842

## 2017-12-10 HISTORY — PX: CYSTOSCOPY W/ RETROGRADES: SHX1426

## 2017-12-10 HISTORY — PX: URETERAL BIOPSY: SHX6688

## 2017-12-10 LAB — GLUCOSE, CAPILLARY
GLUCOSE-CAPILLARY: 169 mg/dL — AB (ref 65–99)
Glucose-Capillary: 152 mg/dL — ABNORMAL HIGH (ref 65–99)

## 2017-12-10 LAB — CULTURE, URINE COMPREHENSIVE

## 2017-12-10 SURGERY — URETEROSCOPY
Anesthesia: General | Site: Ureter | Laterality: Left | Wound class: Clean Contaminated

## 2017-12-10 MED ORDER — OXYCODONE-ACETAMINOPHEN 5-325 MG PO TABS: 1.0000 | ORAL_TABLET | ORAL | 0 refills | Status: DC | PRN

## 2017-12-10 MED ORDER — FENTANYL CITRATE (PF) 100 MCG/2ML IJ SOLN
INTRAMUSCULAR | Status: AC
  Administered 2017-12-10: 25 ug via INTRAVENOUS
  Filled 2017-12-10: qty 2

## 2017-12-10 MED ORDER — SUCCINYLCHOLINE CHLORIDE 20 MG/ML IJ SOLN
INTRAMUSCULAR | Status: DC | PRN
  Administered 2017-12-10: 100 mg via INTRAVENOUS

## 2017-12-10 MED ORDER — SUGAMMADEX SODIUM 200 MG/2ML IV SOLN
INTRAVENOUS | Status: DC | PRN
  Administered 2017-12-10: 200 mg via INTRAVENOUS

## 2017-12-10 MED ORDER — FAMOTIDINE 20 MG PO TABS
20.0000 mg | ORAL_TABLET | Freq: Once | ORAL | Status: AC
  Administered 2017-12-10: 20 mg via ORAL

## 2017-12-10 MED ORDER — SUCCINYLCHOLINE CHLORIDE 20 MG/ML IJ SOLN
INTRAMUSCULAR | Status: AC
  Filled 2017-12-10: qty 1

## 2017-12-10 MED ORDER — OXYBUTYNIN CHLORIDE 5 MG PO TABS: 5.0000 mg | ORAL_TABLET | Freq: Three times a day (TID) | ORAL | 0 refills | Status: DC | PRN

## 2017-12-10 MED ORDER — ONDANSETRON HCL 4 MG/2ML IJ SOLN: 4.0000 mg | Freq: Once | INTRAMUSCULAR | Status: DC | PRN

## 2017-12-10 MED ORDER — IOTHALAMATE MEGLUMINE 43 % IV SOLN
INTRAVENOUS | Status: DC | PRN
  Administered 2017-12-10: 15 mL via URETHRAL

## 2017-12-10 MED ORDER — PHENYLEPHRINE HCL 10 MG/ML IJ SOLN
INTRAMUSCULAR | Status: DC | PRN
  Administered 2017-12-10 (×5): 100 ug via INTRAVENOUS

## 2017-12-10 MED ORDER — TAMSULOSIN HCL 0.4 MG PO CAPS: 0.4000 mg | ORAL_CAPSULE | Freq: Every day | ORAL | 0 refills | Status: DC

## 2017-12-10 MED ORDER — SODIUM CHLORIDE 0.9 % IV SOLN
INTRAVENOUS | Status: DC
  Administered 2017-12-10: 11:00:00 via INTRAVENOUS

## 2017-12-10 MED ORDER — FENTANYL CITRATE (PF) 100 MCG/2ML IJ SOLN
25.0000 ug | INTRAMUSCULAR | Status: DC | PRN
  Administered 2017-12-10 (×4): 25 ug via INTRAVENOUS

## 2017-12-10 MED ORDER — ROCURONIUM BROMIDE 100 MG/10ML IV SOLN
INTRAVENOUS | Status: DC | PRN
  Administered 2017-12-10: 5 mg via INTRAVENOUS
  Administered 2017-12-10: 30 mg via INTRAVENOUS
  Administered 2017-12-10: 5 mg via INTRAVENOUS
  Administered 2017-12-10: 10 mg via INTRAVENOUS

## 2017-12-10 MED ORDER — ONDANSETRON HCL 4 MG/2ML IJ SOLN
INTRAMUSCULAR | Status: AC
  Filled 2017-12-10: qty 2

## 2017-12-10 MED ORDER — PROPOFOL 10 MG/ML IV BOLUS
INTRAVENOUS | Status: AC
  Filled 2017-12-10: qty 20

## 2017-12-10 MED ORDER — ROCURONIUM BROMIDE 50 MG/5ML IV SOLN
INTRAVENOUS | Status: AC
Start: 2017-12-10 — End: ?
  Filled 2017-12-10: qty 1

## 2017-12-10 MED ORDER — FENTANYL CITRATE (PF) 100 MCG/2ML IJ SOLN
INTRAMUSCULAR | Status: AC
  Filled 2017-12-10: qty 2

## 2017-12-10 MED ORDER — CIPROFLOXACIN IN D5W 400 MG/200ML IV SOLN
INTRAVENOUS | Status: AC
  Filled 2017-12-10: qty 200

## 2017-12-10 MED ORDER — ONDANSETRON HCL 4 MG/2ML IJ SOLN
INTRAMUSCULAR | Status: DC | PRN
  Administered 2017-12-10: 4 mg via INTRAVENOUS

## 2017-12-10 MED ORDER — MIDAZOLAM HCL 2 MG/2ML IJ SOLN
INTRAMUSCULAR | Status: AC
  Filled 2017-12-10: qty 2

## 2017-12-10 MED ORDER — PHENYLEPHRINE HCL 10 MG/ML IJ SOLN
INTRAMUSCULAR | Status: AC
  Filled 2017-12-10: qty 1

## 2017-12-10 MED ORDER — FENTANYL CITRATE (PF) 100 MCG/2ML IJ SOLN
INTRAMUSCULAR | Status: DC | PRN
  Administered 2017-12-10 (×2): 50 ug via INTRAVENOUS

## 2017-12-10 MED ORDER — PROPOFOL 10 MG/ML IV BOLUS
INTRAVENOUS | Status: DC | PRN
  Administered 2017-12-10: 100 mg via INTRAVENOUS

## 2017-12-10 MED ORDER — FAMOTIDINE 20 MG PO TABS
ORAL_TABLET | ORAL | Status: AC
  Filled 2017-12-10: qty 1

## 2017-12-10 MED ORDER — MIDAZOLAM HCL 2 MG/2ML IJ SOLN
INTRAMUSCULAR | Status: DC | PRN
  Administered 2017-12-10: 2 mg via INTRAVENOUS

## 2017-12-10 MED ORDER — SODIUM CHLORIDE 0.9 % IV SOLN
INTRAVENOUS | Status: DC | PRN
  Administered 2017-12-10: 16.6 ug/min via INTRAVENOUS

## 2017-12-10 SURGICAL SUPPLY — 36 items
BAG DRAIN CYSTO-URO LG1000N (MISCELLANEOUS) ×4 IMPLANT
BASKET 3WIRE GEMINI 2.4X120 (MISCELLANEOUS) ×4 IMPLANT
BRUSH SCRUB EZ  4% CHG (MISCELLANEOUS) ×2
BRUSH SCRUB EZ 1% IODOPHOR (MISCELLANEOUS) ×4 IMPLANT
BRUSH SCRUB EZ 4% CHG (MISCELLANEOUS) ×2 IMPLANT
CATH URETL 5X70 OPEN END (CATHETERS) ×4 IMPLANT
CNTNR SPEC 2.5X3XGRAD LEK (MISCELLANEOUS) ×2
CONRAY 43 FOR UROLOGY 50M (MISCELLANEOUS) ×4 IMPLANT
CONT SPEC 4OZ STER OR WHT (MISCELLANEOUS) ×2
CONTAINER SPEC 2.5X3XGRAD LEK (MISCELLANEOUS) ×2 IMPLANT
DRAPE UTILITY 15X26 TOWEL STRL (DRAPES) ×4 IMPLANT
DRSG TELFA 4X3 1S NADH ST (GAUZE/BANDAGES/DRESSINGS) ×4 IMPLANT
FORCEPS BIOP PIRANHA Y (CUTTING FORCEPS) ×4 IMPLANT
GLOVE BIO SURGEON STRL SZ 6.5 (GLOVE) ×3 IMPLANT
GLOVE BIO SURGEONS STRL SZ 6.5 (GLOVE) ×1
GOWN STRL REUS W/ TWL LRG LVL3 (GOWN DISPOSABLE) ×4 IMPLANT
GOWN STRL REUS W/TWL LRG LVL3 (GOWN DISPOSABLE) ×4
GUIDEWIRE GREEN .038 145CM (MISCELLANEOUS) ×4 IMPLANT
INFUSOR MANOMETER BAG 3000ML (MISCELLANEOUS) ×4 IMPLANT
INTRODUCER DILATOR DOUBLE (INTRODUCER) IMPLANT
KIT TURNOVER CYSTO (KITS) ×4 IMPLANT
NDL SAFETY ECLIPSE 18X1.5 (NEEDLE) IMPLANT
NEEDLE HYPO 18GX1.5 SHARP (NEEDLE)
PACK CYSTO AR (MISCELLANEOUS) ×4 IMPLANT
SENSORWIRE 0.038 NOT ANGLED (WIRE) ×4
SET CYSTO W/LG BORE CLAMP LF (SET/KITS/TRAYS/PACK) ×4 IMPLANT
SHEATH URETERAL 12FRX35CM (MISCELLANEOUS) ×4 IMPLANT
SOL .9 NS 3000ML IRR  AL (IV SOLUTION) ×2
SOL .9 NS 3000ML IRR UROMATIC (IV SOLUTION) ×2 IMPLANT
STENT URET 6FRX24 CONTOUR (STENTS) IMPLANT
STENT URET 6FRX26 CONTOUR (STENTS) ×4 IMPLANT
SURGILUBE 2OZ TUBE FLIPTOP (MISCELLANEOUS) ×4 IMPLANT
SYRINGE IRR TOOMEY STRL 70CC (SYRINGE) ×4 IMPLANT
WATER STERILE IRR 1000ML POUR (IV SOLUTION) ×4 IMPLANT
WATER STERILE IRR 3000ML UROMA (IV SOLUTION) ×4 IMPLANT
WIRE SENSOR 0.038 NOT ANGLED (WIRE) ×2 IMPLANT

## 2017-12-10 NOTE — Interval H&P Note (Signed)
History and Physical Interval Note:  12/10/2017 11:02 AM  [place] Mcgee  has presented today for surgery, with the diagnosis of urothelial carcinoma of left kidney  The various methods of treatment have been discussed with the patient and family. After consideration of risks, benefits and other options for treatment, the patient has consented to  Procedure(s): URETEROSCOPY (Left) URETERAL & renal PELVIS BIOPSY (Left) CYSTOSCOPY WITH RETROGRADE PYELOGRAM (Bilateral) CYSTOSCOPY WITH STENT PLACEMENT (Left) as a surgical intervention .  The patient's history has been reviewed, patient examined, no change in status, stable for surgery.  I have reviewed the patient's chart and labs.  Questions were answered to the patient's satisfaction.    RRR CTAB  [name]

## 2017-12-10 NOTE — Op Note (Signed)
Date of procedure: 12/10/17  Preoperative diagnosis:  1. Left lower pole renal mass  Postoperative diagnosis:  1.  same as above  Procedure: 1. Bilateral retrograde pyelogram 2. Cystoscopy 3. Left ureteroscopy with renal pelvic biopsy 4. Left ureteral stent placement  Surgeon: [name], MD  Anesthesia: General  Complications: None  Intraoperative findings: Large nodular, vascular tumor within the left lower pole collecting system completely obstructing the entire left lower pole moiety.  Concern for high-grade urothelial carcinoma.  Normal ureter.  Right kidney normal.  No bladder tumors.  EBL: Minimal  Specimens: Left renal pelvis biopsy  Drains: 6 x 26 French double-J ureteral stent on left with tether  Indication: [place] Mcgee is a 72 y.o. patient with left renal mass which appears to involve the left lower pole collecting system with significant adenopathy concerning for metastatic disease as well as cachexia.  After reviewing the management options for treatment, he elected to proceed with the above surgical procedure(s). We have discussed the potential benefits and risks of the procedure, side effects of the proposed treatment, the likelihood of the patient achieving the goals of the procedure, and any potential problems that might occur during the procedure or recuperation. Informed consent has been obtained.  Description of procedure:  The patient was taken to the operating room and general anesthesia was induced.  The patient was placed in the dorsal lithotomy position, prepped and draped in the usual sterile fashion, and preoperative antibiotics were administered. A preoperative time-out was performed.   A 21 French scope was advanced per urethra into the bladder.  Of note, there were no tumors within the bladder which was mildly trabeculated with distention.  Attention was turned to the right ureteral orifice which was intubated using a 5 [place] open-ended  ureteral catheter.  Gentle retrograde pyelogram on the side revealed no filling defects or renal pelvic abnormalities or hydronephrosis.  Attention was then turned to the left ureteral orifice which was cannulated with the same 5 [place] open-ended ureteral catheter.  Retrograde pyelogram on the side revealed a delicate appearing ureter and renal pelvis with complete obstruction of the left lower pole moiety concerning for tumor within the collecting system.  The upper and midpole systems appeared to be normal without filling defects or hydronephrosis.  A sensor wire was then placed up to level the kidney without difficulty.  A second Super Stiff wire was introduced using the same technique.  A Cook 12/14 French ureteral access sheath was then advanced over the Super Stiff wire to the proximal ureter and the inner lumen was removed.  A flexible dual-lumen 8 French Wolf ureteroscope was then advanced into the collecting system.  The upper and midpole revealed normal calyces without any tumor.  Unfortunately, there was nodular tumor at the opening to the lower pole structure completely obstructing the entrance presumably filling all of these calyces as there is no filling with contrast beyond this point.  The tumor itself had a nodular whitish appearance with hypervascularity noted on the surface.  Used multiple baskets including a Development worker, international aid, piranha forceps, as well as additional biopsy forceps to attempt to sample as much tissue as possible.  Unfortunately, due to the hypervascular nature of the tumor, it was somewhat bloody limiting visualization.  I was able to take several samples of what appeared to be viable tumor which were passed off the field as renal pelvic biopsies.  A final retrograde pyelogram was performed which showed no contrast extravasation outline the upper tract collecting  system.  The scope was then backed in length the ureter removing the access sheath along the way.  The safety wire was  backloaded over rigid cystoscope.  A 6 x 26 French double-J ureteral stent was then placed over the wire up to level the renal pelvis.  The wires partially drawn until focal stone within the renal pelvis.  It was fully withdrawn a focal stone within the bladder.  The string was left affixed to the distal coil of the stent.  This was taped to the glans using Mastisol and Tegaderm and his foreskin was reduced.  He was then clean and dry, repositioned in supine position, reversed by anesthesia, taken to the PACU in stable condition.  Plan: Patient may remove his own stent on Friday.  He has a follow-up with Dr. [name] on Thursday, hopefully pathology will be available at this point time.  All questions were answered.  [name], M.D.

## 2017-12-10 NOTE — Transfer of Care (Signed)
Immediate Anesthesia Transfer of Care Note  Patient: [place] Mcgee  Procedure(s) Performed: URETEROSCOPY (Left Ureter) URETERAL & renal PELVIS BIOPSY (Left Ureter) CYSTOSCOPY WITH RETROGRADE PYELOGRAM (Bilateral Ureter) CYSTOSCOPY WITH STENT PLACEMENT (Left Ureter)  Patient Location: PACU  Anesthesia Type:General  Level of Consciousness: drowsy  Airway & Oxygen Therapy: Patient Spontanous Breathing and Patient connected to face mask oxygen  Post-op Assessment: Report given to RN and Post -op Vital signs reviewed and stable  Post vital signs: Reviewed and stable  Last Vitals:  Vitals Value Taken Time  BP 141/83 12/10/2017 12:55 PM  Temp 36.6 C 12/10/2017 12:55 PM  Pulse 60 12/10/2017 12:59 PM  Resp 18 12/10/2017 12:59 PM  SpO2 100 % 12/10/2017 12:59 PM  Vitals shown include unvalidated device data.  Last Pain:  Vitals:   12/10/17 1255  TempSrc:   PainSc: Asleep         Complications: No apparent anesthesia complications

## 2017-12-10 NOTE — Discharge Instructions (Signed)
You have a ureteral stent in place.  This is a tube that extends from your kidney to your bladder.  This may cause urinary bleeding, burning with urination, and urinary frequency.  Please call our office or present to the ED if you develop fevers >101 or pain which is not able to be controlled with oral pain medications.  You may be given either Flomax and/ or ditropan to help with bladder spasms and stent pain in addition to pain medications.   ° °[facility] °[address], [address] °[place], [place] [address] °[phone] ° ° ° °AMBULATORY SURGERY  °DISCHARGE INSTRUCTIONS ° ° °1) The drugs that you were given will stay in your system until tomorrow so for the next 24 hours you should not: ° °A) Drive an automobile °B) Make any legal decisions °C) Drink any alcoholic beverage ° ° °2) You may resume regular meals tomorrow.  Today it is better to start with liquids and gradually work up to solid foods. ° °You may eat anything you prefer, but it is better to start with liquids, then soup and crackers, and gradually work up to solid foods. ° ° °3) Please notify your doctor immediately if you have any unusual bleeding, trouble breathing, redness and pain at the surgery site, drainage, fever, or pain not relieved by medication. ° ° ° °4) Additional Instructions: ° ° ° ° ° ° ° °Please contact your physician with any problems or Same Day Surgery at [phone], Monday through Friday 6 am to 4 pm, or [place] [facility] [place] Main number at [phone]. °

## 2017-12-10 NOTE — Telephone Encounter (Signed)
Called and spoke patient's wife Beverlee Nims) and made her aware  of her husband's scheduled appt. to see Dr. [name] on 12/13/17 @ 1:30

## 2017-12-10 NOTE — Anesthesia Post-op Follow-up Note (Signed)
Anesthesia QCDR form completed.        

## 2017-12-10 NOTE — Anesthesia Procedure Notes (Signed)
Procedure Name: Intubation Date/Time: 12/10/2017 11:32 AM Performed by: Rudean Hitt, CRNA Pre-anesthesia Checklist: Patient identified, Patient being monitored, Timeout performed, Emergency Drugs available and Suction available Patient Re-evaluated:Patient Re-evaluated prior to induction Oxygen Delivery Method: Circle system utilized Preoxygenation: Pre-oxygenation with 100% oxygen Induction Type: IV induction Ventilation: Mask ventilation without difficulty Laryngoscope Size: Mac and 4 Grade View: Grade II Tube type: Oral Tube size: 7.0 mm Number of attempts: 1 Airway Equipment and Method: Stylet Placement Confirmation: ETT inserted through vocal cords under direct vision,  positive ETCO2 and breath sounds checked- equal and bilateral Secured at: 21 cm Tube secured with: Tape Dental Injury: Teeth and Oropharynx as per pre-operative assessment

## 2017-12-10 NOTE — Anesthesia Preprocedure Evaluation (Addendum)
Anesthesia Evaluation  Patient identified by MRN, date of birth, ID band Patient awake    Reviewed: Allergy & Precautions, H&P , NPO status , Patient's Chart, lab work & pertinent test results, reviewed documented beta blocker date and time   History of Anesthesia Complications Negative for: history of anesthetic complications  Airway Mallampati: II   Neck ROM: full    Dental  (+) Poor Dentition   Pulmonary neg shortness of breath, asthma , sleep apnea , neg recent URI, former smoker,           Cardiovascular hypertension, (-) angina+ CAD, + Past MI and + Cardiac Stents  (-) CABG (-) dysrhythmias      Neuro/Psych PSYCHIATRIC DISORDERS Anxiety Depression negative neurological ROS     GI/Hepatic Neg liver ROS, GERD  ,  Endo/Other  negative endocrine ROSdiabetesHypothyroidism   Renal/GU Renal disease  negative genitourinary   Musculoskeletal   Abdominal   Peds  Hematology negative hematology ROS (+)   Anesthesia Other Findings Past Medical History: No date: Diabetes mellitus without complication ([place]) No date: Hyperlipidemia No date: Hypertension 1995: Myocardial infarction Past Surgical History: No date: CATARACT EXTRACTION Left 2010: COLONOSCOPY     Comment: [place], 1998, 2000, 2001, 2014: CORONARY ANGIOPLASTY WITH STENT PLACEMENT 08/09/2015: INGUINAL HERNIA REPAIR Right     Comment: Procedure: HERNIA REPAIR INGUINAL ADULT;                Surgeon: [name], MD;  Location: [facility]              ORS;  Service: General;  Laterality: Right; No date: NASAL SINUS SURGERY BMI    Body Mass Index:  31.17 kg/m     Reproductive/Obstetrics negative OB ROS                            Anesthesia Physical  Anesthesia Plan  ASA: III  Anesthesia Plan: General   Post-op Pain Management:    Induction: Intravenous  PONV Risk Score and Plan: 2 and Ondansetron and  Dexamethasone  Airway Management Planned: Oral ETT  Additional Equipment:   Intra-op Plan:   Post-operative Plan: Extubation in OR  Informed Consent: I have reviewed the patients History and Physical, chart, labs and discussed the procedure including the risks, benefits and alternatives for the proposed anesthesia with the patient or authorized representative who has indicated his/her understanding and acceptance.   Dental Advisory Given  Plan Discussed with: CRNA  Anesthesia Plan Comments:         Anesthesia Quick Evaluation

## 2017-12-10 NOTE — Anesthesia Postprocedure Evaluation (Signed)
Anesthesia Post Note  Patient: JERAULD BOSTWICK  Procedure(s) Performed: URETEROSCOPY (Left Ureter) URETERAL & renal PELVIS BIOPSY (Left Ureter) CYSTOSCOPY WITH RETROGRADE PYELOGRAM (Bilateral Ureter) CYSTOSCOPY WITH STENT PLACEMENT (Left Ureter)  Patient location during evaluation: PACU Anesthesia Type: General Level of consciousness: awake and alert Pain management: pain level controlled Vital Signs Assessment: post-procedure vital signs reviewed and stable Respiratory status: spontaneous breathing, nonlabored ventilation, respiratory function stable and patient connected to nasal cannula oxygen Cardiovascular status: blood pressure returned to baseline and stable Postop Assessment: no apparent nausea or vomiting Anesthetic complications: no     Last Vitals:  Vitals:   12/10/17 1046 12/10/17 1255  BP: 133/82 (!) 141/83  Pulse: 65 60  Resp: 16 (!) 21  Temp: (!) 36.3 C 36.6 C  SpO2: 97% 100%    Last Pain:  Vitals:   12/10/17 1255  TempSrc:   PainSc: Asleep                 Molli Barrows

## 2017-12-11 ENCOUNTER — Other Ambulatory Visit: Payer: Self-pay | Admitting: Pathology

## 2017-12-11 ENCOUNTER — Inpatient Hospital Stay: Admission: RE | Admit: 2017-12-11 | Payer: Medicare Other | Source: Ambulatory Visit

## 2017-12-11 LAB — MISC LABCORP TEST (SEND OUT)
LABCORP TEST CODE: 9068
LabCorp test name: 9068

## 2017-12-12 ENCOUNTER — Other Ambulatory Visit: Payer: Self-pay | Admitting: Hematology and Oncology

## 2017-12-13 ENCOUNTER — Encounter: Payer: Self-pay | Admitting: *Deleted

## 2017-12-13 ENCOUNTER — Telehealth: Payer: Self-pay | Admitting: *Deleted

## 2017-12-13 ENCOUNTER — Inpatient Hospital Stay: Payer: Medicare Other | Attending: Hematology and Oncology | Admitting: Hematology and Oncology

## 2017-12-13 ENCOUNTER — Encounter: Payer: Self-pay | Admitting: Hematology and Oncology

## 2017-12-13 ENCOUNTER — Inpatient Hospital Stay: Payer: Medicare Other

## 2017-12-13 VITALS — BP 135/71 | HR 61 | Temp 97.6°F | Resp 18 | Wt 160.0 lb

## 2017-12-13 DIAGNOSIS — I2510 Atherosclerotic heart disease of native coronary artery without angina pectoris: Secondary | ICD-10-CM | POA: Diagnosis not present

## 2017-12-13 DIAGNOSIS — R109 Unspecified abdominal pain: Secondary | ICD-10-CM | POA: Diagnosis not present

## 2017-12-13 DIAGNOSIS — Z7984 Long term (current) use of oral hypoglycemic drugs: Secondary | ICD-10-CM | POA: Diagnosis not present

## 2017-12-13 DIAGNOSIS — Z5111 Encounter for antineoplastic chemotherapy: Secondary | ICD-10-CM | POA: Diagnosis not present

## 2017-12-13 DIAGNOSIS — K5903 Drug induced constipation: Secondary | ICD-10-CM | POA: Diagnosis not present

## 2017-12-13 DIAGNOSIS — Z960 Presence of urogenital implants: Secondary | ICD-10-CM | POA: Insufficient documentation

## 2017-12-13 DIAGNOSIS — R319 Hematuria, unspecified: Secondary | ICD-10-CM

## 2017-12-13 DIAGNOSIS — C642 Malignant neoplasm of left kidney, except renal pelvis: Secondary | ICD-10-CM

## 2017-12-13 DIAGNOSIS — I951 Orthostatic hypotension: Secondary | ICD-10-CM | POA: Diagnosis not present

## 2017-12-13 DIAGNOSIS — E1140 Type 2 diabetes mellitus with diabetic neuropathy, unspecified: Secondary | ICD-10-CM

## 2017-12-13 DIAGNOSIS — E119 Type 2 diabetes mellitus without complications: Secondary | ICD-10-CM | POA: Diagnosis not present

## 2017-12-13 DIAGNOSIS — I10 Essential (primary) hypertension: Secondary | ICD-10-CM | POA: Diagnosis not present

## 2017-12-13 DIAGNOSIS — Z7189 Other specified counseling: Secondary | ICD-10-CM

## 2017-12-13 DIAGNOSIS — G893 Neoplasm related pain (acute) (chronic): Secondary | ICD-10-CM

## 2017-12-13 LAB — CBC WITH DIFFERENTIAL/PLATELET
BASOS ABS: 0.1 10*3/uL (ref 0–0.1)
BASOS PCT: 1 %
EOS PCT: 2 %
Eosinophils Absolute: 0.2 10*3/uL (ref 0–0.7)
HEMATOCRIT: 34 % — AB (ref 40.0–52.0)
Hemoglobin: 11.7 g/dL — ABNORMAL LOW (ref 13.0–18.0)
Lymphocytes Relative: 14 %
Lymphs Abs: 1.2 10*3/uL (ref 1.0–3.6)
MCH: 30.1 pg (ref 26.0–34.0)
MCHC: 34.3 g/dL (ref 32.0–36.0)
MCV: 87.6 fL (ref 80.0–100.0)
MONO ABS: 0.9 10*3/uL (ref 0.2–1.0)
MONOS PCT: 10 %
NEUTROS ABS: 6 10*3/uL (ref 1.4–6.5)
Neutrophils Relative %: 73 %
PLATELETS: 320 10*3/uL (ref 150–440)
RBC: 3.89 MIL/uL — ABNORMAL LOW (ref 4.40–5.90)
RDW: 14 % (ref 11.5–14.5)
WBC: 8.3 10*3/uL (ref 3.8–10.6)

## 2017-12-13 LAB — SAMPLE TO BLOOD BANK

## 2017-12-13 NOTE — Telephone Encounter (Signed)
Rechecked BP after labs drawn.  Encouraged patient to increase fluids.

## 2017-12-13 NOTE — Progress Notes (Signed)
[facility] day:  12/13/2017  Chief Complaint: Patrick Mcgee is a 72 y.o. male with metastatic urothelial carcinoma who is seen for review of interval imaging, review of pathology from left ureteroscopy and biopsy, and discussion regarding direction of therapy.  HPI:  Patrick Mcgee patient was last seen in the medical oncology clinic on 05//31/2019 for new patient assessment.  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  He presented with abdominal pain and weight loss.  Chest CT on 12/07/2017 revealed no evidence of metastatic disease.  There was interstitial lung disease as evidenced by subpleural reticulation but no honeycombing. This could represent nonspecific interstitial pneumonitis or early usual interstitial pneumonitis.  He has aortic atherosclerosis, coronary artery atherosclerosis, emphysema, and probable pulmonary arterial hypertension.  He was seen by Dr. Erlene Quan on 12/07/2017. He underwent bilateral retrograde pyelogram, cystoscopy, left ureteroscopy with renal pelvis biopsy, and left ureteral stent placement on 12/10/2017.  Findings included a large nodular, vascular tumor within the left lower pole collecting system completely obstructing the entire left lower pole moiety.  Normal ureter.  Right kidney normal.  No bladder tumors.  Pathology revealed small fragments of high-grade urothelial carcinoma with a small focus of invasion.  During the interim, his abdominal pain is better controlled with oxycodone 5 mg with Tylenol 500 mg every 6 hours.  He denies any fever, chills or sweats.  His blood pressure is controlled with 3 blood pressure medications (amlodipine, carvedilol, and losartan).  Initial blood pressure check is low today.  He denies any medication confusion.  Fluid intake has been good.  Weight is up 3 pounds.   Past Medical History:  Diagnosis Date  . Acid  reflux   . Anxiety   . Cancer ([place])    renal mass  . Depression   . Diabetes mellitus without complication ([place])   . Hyperlipidemia   . Hypertension   . Myocardial infarction ([place]) 1995  . Sleep apnea     Past Surgical History:  Procedure Laterality Date  . CATARACT EXTRACTION Left   . COLONOSCOPY  2010   [facility]  . COLONOSCOPY WITH PROPOFOL N/A 10/04/2016   Procedure: COLONOSCOPY WITH PROPOFOL;  Surgeon: [name], MD;  Location: [facility] ENDOSCOPY;  Service: Endoscopy;  Laterality: N/A;  . [place], 2000, 2001, 2014  . CYSTOSCOPY W/ RETROGRADES Bilateral 12/10/2017   Procedure: CYSTOSCOPY WITH RETROGRADE PYELOGRAM;  Surgeon: [name], MD;  Location: [facility] ORS;  Service: Urology;  Laterality: Bilateral;  . CYSTOSCOPY WITH STENT PLACEMENT Left 12/10/2017   Procedure: CYSTOSCOPY WITH STENT PLACEMENT;  Surgeon: [name], MD;  Location: [facility] ORS;  Service: Urology;  Laterality: Left;  . INGUINAL HERNIA REPAIR Right 08/09/2015   Procedure: HERNIA REPAIR INGUINAL ADULT;  Surgeon: [name], MD;  Location: [facility] ORS;  Service: General;  Laterality: Right;  . NASAL SINUS SURGERY    . nuclear stress test    . URETERAL BIOPSY Left 12/10/2017   Procedure: URETERAL & renal PELVIS BIOPSY;  Surgeon: [name], MD;  Location: [facility] ORS;  Service: Urology;  Laterality: Left;  . URETEROSCOPY Left 12/10/2017   Procedure: URETEROSCOPY;  Surgeon: [name], MD;  Location: [facility] ORS;  Service: Urology;  Laterality: Left;    Family History  Problem Relation Age of Onset  . Heart disease Mother   . Cancer Father  Lung and colon cancer  . Heart disease Father   . Emphysema Maternal Grandfather   . Tuberculosis Maternal Grandmother     Social History:  reports that he has never smoked. He has quit using smokeless tobacco. His smokeless tobacco use included chew. He reports that he drinks about 0.6 - 3.0 oz of alcohol per week.  He reports that he does not use drugs.  Patient is retired from the [employer]. Patient denies known exposures to radiation on toxins. The patient is accompanied by his wife, Patrick Mcgee, and daughter today.  Allergies:  Allergies  Allergen Reactions  . Sulfa Antibiotics Other (See Comments)    Joint pain  . Ace Inhibitors Cough  . Invokana [Canagliflozin] Other (See Comments)    Leg pain  . Penicillins Rash    Current Medications: Current Outpatient Medications  Medication Sig Dispense Refill  . amLODipine (NORVASC) 5 MG tablet TAKE 1 TABLET BY MOUTH ONCE DAILY 90 tablet 3  . aspirin 81 MG tablet Take 1 mg by mouth daily.     Marland Kitchen atorvastatin (LIPITOR) 40 MG tablet TAKE ONE TABLET BY MOUTH ONCE DAILY 90 tablet 3  . carvedilol (COREG) 12.5 MG tablet TAKE ONE TABLET BY MOUTH TWICE DAILY 180 tablet 3  . clobetasol cream (TEMOVATE) 7.98 % Apply 1 application topically as needed.     . docusate sodium (COLACE) 100 MG capsule Take 1 capsule (100 mg total) by mouth 2 (two) times daily. 20 capsule 0  . fluticasone (FLONASE) 50 MCG/ACT nasal spray Place 2 sprays into both nostrils daily. 16 g 12  . glucose blood (CONTOUR NEXT TEST) test strip Checks sugar 4 times daily. DX E11.9-strips for Contour next EZ meter 360 each 3  . Insulin Pen Needle 31G X 5 MM MISC Needs BD ultra fine mini needles to use twice daily DX E11.9 100 each 3  . Ivermectin (SOOLANTRA) 1 % CREA Apply topically daily.    Marland Kitchen losartan (COZAAR) 100 MG tablet TAKE ONE TABLET BY MOUTH ONCE DAILY 90 tablet 3  . metFORMIN (GLUCOPHAGE) 1000 MG tablet Take 1 tablet (1,000 mg total) by mouth 2 (two) times daily with a meal. 180 tablet 3  . NON FORMULARY CPAP machine every night-measurement 10    . nystatin cream (MYCOSTATIN) APPLY  CREAM TOPICALLY TWICE DAILY AROUND  THE  GROIN  AREA (Patient taking differently: APPLY  CREAM TOPICALLY TWICE DAILY AROUND  THE  GROIN  AREA as needed) 15 g 1  . ondansetron (ZOFRAN ODT) 4 MG disintegrating tablet  Take 1 tablet (4 mg total) by mouth every 8 (eight) hours as needed for nausea or vomiting. 20 tablet 0  . oxybutynin (DITROPAN) 5 MG tablet Take 1 tablet (5 mg total) by mouth every 8 (eight) hours as needed for bladder spasms. 30 tablet 0  . oxyCODONE (OXY IR/ROXICODONE) 5 MG immediate release tablet Take 1-2 tablets (5-10 mg total) by mouth every 6 (six) hours as needed for severe pain. 40 tablet 0  . polyethylene glycol (MIRALAX) packet Take 17 g by mouth 2 (two) times daily. 14 each 0  . tamsulosin (FLOMAX) 0.4 MG CAPS capsule Take 1 capsule (0.4 mg total) by mouth daily. 30 capsule 0  . empagliflozin (JARDIANCE) 10 MG TABS tablet Take 10 mg by mouth daily. (Patient not taking: Reported on 12/07/2017) 30 tablet 0  . oxyCODONE-acetaminophen (PERCOCET) 5-325 MG tablet Take 1-2 tablets by mouth every 4 (four) hours as needed for moderate pain or severe pain. (Patient not  taking: Reported on 12/13/2017) 12 tablet 0   No current facility-administered medications for this visit.     Review of Systems  Constitutional: Positive for malaise/fatigue. Negative for diaphoresis, fever and weight loss (weight up 3 pounds).       Feeling better.  HENT: Negative.  Negative for congestion, nosebleeds and sore throat.   Eyes: Negative.  Negative for blurred vision, double vision, pain and redness.  Respiratory: Negative for cough, hemoptysis, sputum production and shortness of breath.   Cardiovascular: Negative.  Negative for chest pain, palpitations, orthopnea, leg swelling and PND.       MI s/p stent placement x 5 or 6 (last 2015).  Blood pressure controlled with 3 medications.  Gastrointestinal: Positive for constipation. Negative for abdominal pain, blood in stool, diarrhea, melena, nausea and vomiting.       Appetite improved.  Reflux.  Last colonoscopy was in 2018 - normal. Does not tolerate nausea well.  Genitourinary: Positive for hematuria (small amount post stent placement). Negative for dysuria,  frequency and urgency.  Musculoskeletal: Negative for back pain, joint pain and myalgias.  Skin: Negative.  Negative for itching and rash.  Neurological: Positive for tingling (diabetic neuropathy in feet) and weakness (generalized). Negative for dizziness, tremors, sensory change, focal weakness and headaches.  Endo/Heme/Allergies: Positive for environmental allergies (seasonal allergies - "all of my life"). Does not bruise/bleed easily.       Type II diabetes mellitus  Psychiatric/Behavioral: Negative for depression, memory loss and suicidal ideas. The patient is not nervous/anxious and does not have insomnia.   All other systems reviewed and are negative.  Performance status (ECOG): 2 - Symptomatic, <50% confined to bed  Physical Exam: Blood pressure 135/71, pulse 61, temperature 97.6 F (36.4 C), temperature source Tympanic, resp. rate 18, weight 160 lb (72.6 kg). GENERAL:  Slightly fatigued appearing gentleman sitting comfortably in the exam room in no acute distress. MENTAL STATUS:  Alert and oriented to person, place and time. HEAD:  Thin white hair.  Normocephalic, atraumatic, face symmetric, no Cushingoid features. EYES:  Brown eyes.  No conjunctivitis or scleral icterus. RESPIRATORY:  Clear to auscultation without rales, wheezes or rhonchi. CARDIOVASCULAR:  Regular rate and rhythm without murmur, rub or gallop. ABDOMEN:  Soft, non-tender, with active bowel sounds, and no hepatosplenomegaly.  No masses. BACK:  No CVA tenderness. SKIN:  No rashes, ulcers or lesions. EXTREMITIES: No edema, no skin discoloration or tenderness.  No palpable cords. NEUROLOGICAL: Unremarkable. PSYCH:  Appropriate.    Appointment on 12/13/2017  Component Date Value Ref Range Status  . Blood Bank Specimen 12/13/2017 SAMPLE AVAILABLE FOR TESTING   Final  . Sample Expiration 12/13/2017    Final                   Value:12/16/2017 Performed at [facility], [address] [place], [place]  [address]   . WBC 12/13/2017 8.3  3.8 - 10.6 K/uL Final  . RBC 12/13/2017 3.89* 4.40 - 5.90 MIL/uL Final  . Hemoglobin 12/13/2017 11.7* 13.0 - 18.0 g/dL Final  . HCT 12/13/2017 34.0* 40.0 - 52.0 % Final  . MCV 12/13/2017 87.6  80.0 - 100.0 fL Final  . MCH 12/13/2017 30.1  26.0 - 34.0 pg Final  . MCHC 12/13/2017 34.3  32.0 - 36.0 g/dL Final  . RDW 12/13/2017 14.0  11.5 - 14.5 % Final  . Platelets 12/13/2017 320  150 - 440 K/uL Final  . Neutrophils Relative % 12/13/2017 73  % Final  .  Neutro Abs 12/13/2017 6.0  1.4 - 6.5 K/uL Final  . Lymphocytes Relative 12/13/2017 14  % Final  . Lymphs Abs 12/13/2017 1.2  1.0 - 3.6 K/uL Final  . Monocytes Relative 12/13/2017 10  % Final  . Monocytes Absolute 12/13/2017 0.9  0.2 - 1.0 K/uL Final  . Eosinophils Relative 12/13/2017 2  % Final  . Eosinophils Absolute 12/13/2017 0.2  0 - 0.7 K/uL Final  . Basophils Relative 12/13/2017 1  % Final  . Basophils Absolute 12/13/2017 0.1  0 - 0.1 K/uL Final   Performed at [facility], [address] [place] [address] [place],  [address]    Assessment:  KRYSTAL DELDUCA is a 72 y.o. 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  He presented with abdominal pain and weight loss.  Abdomen and pelvic CT on 12/01/2017 revealed a suspicious enhancing, filling defect involving the lower pole collecting system of the left kidney with asymmetric hypoenhancement of the inferior pole cortex of left kidney noted. Findings were worrisome for urothelial neoplasm.  There was abdominal and pelvic adenopathy compatible with nodal metastasis.  There was a 2.6 cm right para celiac node, 1,6 cm left retroperitoneal node, 1.4 cm right retroperitoneal node, 2.1 cm right common iliac node and 1 cm left external iliac node.  There was aortic atherosclerosis, kidney cysts, and prostate gland enlargement.  Chest CT on 12/07/2017 revealed no evidence of metastatic disease.    He underwent left  ureteroscopy with renal pelvis biopsy and left ureteral stent placement on 12/10/2017.  Findings included a large nodular, vascular tumor within the left lower pole collecting system completely obstructing the entire left lower pole moiety.  Ureter, right kidney, and bladder was normal.  Pathology revealed small fragments of high-grade urothelial carcinoma with a small focus of invasion.  He has diabetes and coronary artery disease s/p MI in 1995.  He is s/p stent plaecment x 5-6.  Colonoscopy in 2018 was negative.  Symptomatically, his abdominal pain appears better controlled.  He has a small amount of hematuria s/p stent placement.  Exam is stable.  Plan: 1.  Discuss interval cystoscopy and biopsy- urothelial carcinoma. 2.  Discuss interval chest CT- no evidence of distant metastatic disease.. 3.  Discuss treatment of metastatic urothelial carcinoma.  Discuss plan for a platinum based regimen (carboplatin or cisplatin) with gemcitabine.  Patient states that he does not tolerate nausea.  Decision to proceed with carboplatin + gemcitabine.  Side effects reviewed in detail.  Discussed response rates and TTP.  Discuss second line therapy.  Discuss port-a-cath placement.  Discuss chemotherapy class. 4.  Discuss rechecking blood pressure. 100/60 with repeat 137/71. 5.  Labs today:  CBC with diff.  Hemoglobin 11.7. 6.  Discuss pain medication.  Current pain medication regimen appears adequate.  Oxycodone use minimal.  Maintain pain dairy.  Consider long acting pain mediation at next visit based on use. 7.  Schedule port-a-cath placement. 8.  Schedule chemotherapy class. 9.  Preauth carboplatin and gemcitabine. 10.  Send tissue for additional testing (FGFR1 or FGFR3 genetic alterations for use of Balversa- erdafitinib) at [facility]. 11.  Patient to meet with clinical trials today regarding enrollment on [employer] trial. 12.  RTC after above for MD assessment, labs (CBC with diff, CMP, Mg), and day 1  of cycle #1 carboplatin + gemcitabine.   [name], MD  12/13/2017, 4:35 PM   A total of (25) minutes of face-to-face time was spent with the  patient with greater than 50% of that time in counseling and care-coordination.

## 2017-12-13 NOTE — Progress Notes (Signed)
Pt in for follow up with wife and daughter. States is "feeling better",  Appetite improving, pain controlled.  Pt reports small amounts of blood when voiding.  Pt to pull urinary stent tomorrow.

## 2017-12-13 NOTE — Telephone Encounter (Signed)
Called Dr. Rosanna Randy to discuss patient's decreased BP.  With Dynamap BP was 90/63 sitting and 78?51 standing.  Took it manually and it was 100/60 sitting and 98/50 standing.  Patient is currently taking Norvasc 5 mg, Coreg 12.5 mg and Cozaar 100 mg daily.  Per Dr. Rosanna Randy, patient should stop his Norvasc and follow up with Dr. Rosanna Randy next week.  Spoke with patient with his family present.  Verbalized understanding.

## 2017-12-14 ENCOUNTER — Ambulatory Visit: Payer: Medicare Other

## 2017-12-17 ENCOUNTER — Encounter: Payer: Self-pay | Admitting: Internal Medicine

## 2017-12-17 ENCOUNTER — Encounter: Payer: Self-pay | Admitting: Emergency Medicine

## 2017-12-17 ENCOUNTER — Inpatient Hospital Stay
Admission: EM | Admit: 2017-12-17 | Discharge: 2017-12-19 | DRG: 982 | Disposition: A | Discharge status: Home / Self Care | Payer: Medicare Other | Attending: Family Medicine | Admitting: Family Medicine

## 2017-12-17 ENCOUNTER — Ambulatory Visit: Payer: Medicare Other | Admitting: Family Medicine

## 2017-12-17 ENCOUNTER — Encounter: Payer: Self-pay | Admitting: Hematology and Oncology

## 2017-12-17 ENCOUNTER — Other Ambulatory Visit (INDEPENDENT_AMBULATORY_CARE_PROVIDER_SITE_OTHER): Payer: Self-pay | Admitting: Vascular Surgery

## 2017-12-17 ENCOUNTER — Other Ambulatory Visit: Payer: Self-pay

## 2017-12-17 DIAGNOSIS — Z882 Allergy status to sulfonamides status: Secondary | ICD-10-CM

## 2017-12-17 DIAGNOSIS — R109 Unspecified abdominal pain: Secondary | ICD-10-CM | POA: Diagnosis not present

## 2017-12-17 DIAGNOSIS — Z888 Allergy status to other drugs, medicaments and biological substances status: Secondary | ICD-10-CM

## 2017-12-17 DIAGNOSIS — R1084 Generalized abdominal pain: Secondary | ICD-10-CM

## 2017-12-17 DIAGNOSIS — F329 Major depressive disorder, single episode, unspecified: Secondary | ICD-10-CM | POA: Diagnosis present

## 2017-12-17 DIAGNOSIS — C679 Malignant neoplasm of bladder, unspecified: Secondary | ICD-10-CM

## 2017-12-17 DIAGNOSIS — I10 Essential (primary) hypertension: Secondary | ICD-10-CM | POA: Diagnosis not present

## 2017-12-17 DIAGNOSIS — C649 Malignant neoplasm of unspecified kidney, except renal pelvis: Secondary | ICD-10-CM

## 2017-12-17 DIAGNOSIS — E871 Hypo-osmolality and hyponatremia: Secondary | ICD-10-CM | POA: Diagnosis not present

## 2017-12-17 DIAGNOSIS — Z802 Family history of malignant neoplasm of other respiratory and intrathoracic organs: Secondary | ICD-10-CM

## 2017-12-17 DIAGNOSIS — Z6824 Body mass index (BMI) 24.0-24.9, adult: Secondary | ICD-10-CM

## 2017-12-17 DIAGNOSIS — Z955 Presence of coronary angioplasty implant and graft: Secondary | ICD-10-CM

## 2017-12-17 DIAGNOSIS — Z515 Encounter for palliative care: Secondary | ICD-10-CM

## 2017-12-17 DIAGNOSIS — Z800 Family history of malignant neoplasm of digestive organs: Secondary | ICD-10-CM | POA: Diagnosis not present

## 2017-12-17 DIAGNOSIS — E860 Dehydration: Principal | ICD-10-CM | POA: Diagnosis present

## 2017-12-17 DIAGNOSIS — K5900 Constipation, unspecified: Secondary | ICD-10-CM | POA: Diagnosis not present

## 2017-12-17 DIAGNOSIS — R319 Hematuria, unspecified: Secondary | ICD-10-CM | POA: Diagnosis present

## 2017-12-17 DIAGNOSIS — Z87891 Personal history of nicotine dependence: Secondary | ICD-10-CM

## 2017-12-17 DIAGNOSIS — Z9842 Cataract extraction status, left eye: Secondary | ICD-10-CM

## 2017-12-17 DIAGNOSIS — G893 Neoplasm related pain (acute) (chronic): Secondary | ICD-10-CM

## 2017-12-17 DIAGNOSIS — E440 Moderate protein-calorie malnutrition: Secondary | ICD-10-CM

## 2017-12-17 DIAGNOSIS — I252 Old myocardial infarction: Secondary | ICD-10-CM | POA: Diagnosis not present

## 2017-12-17 DIAGNOSIS — T402X5A Adverse effect of other opioids, initial encounter: Secondary | ICD-10-CM

## 2017-12-17 DIAGNOSIS — G4733 Obstructive sleep apnea (adult) (pediatric): Secondary | ICD-10-CM | POA: Diagnosis present

## 2017-12-17 DIAGNOSIS — C642 Malignant neoplasm of left kidney, except renal pelvis: Secondary | ICD-10-CM | POA: Diagnosis present

## 2017-12-17 DIAGNOSIS — C689 Malignant neoplasm of urinary organ, unspecified: Secondary | ICD-10-CM | POA: Diagnosis not present

## 2017-12-17 DIAGNOSIS — R140 Abdominal distension (gaseous): Secondary | ICD-10-CM | POA: Diagnosis present

## 2017-12-17 DIAGNOSIS — E119 Type 2 diabetes mellitus without complications: Secondary | ICD-10-CM | POA: Diagnosis present

## 2017-12-17 DIAGNOSIS — E785 Hyperlipidemia, unspecified: Secondary | ICD-10-CM | POA: Diagnosis present

## 2017-12-17 DIAGNOSIS — K5903 Drug induced constipation: Secondary | ICD-10-CM | POA: Diagnosis present

## 2017-12-17 DIAGNOSIS — Z794 Long term (current) use of insulin: Secondary | ICD-10-CM

## 2017-12-17 DIAGNOSIS — Z8249 Family history of ischemic heart disease and other diseases of the circulatory system: Secondary | ICD-10-CM

## 2017-12-17 DIAGNOSIS — Z7982 Long term (current) use of aspirin: Secondary | ICD-10-CM

## 2017-12-17 DIAGNOSIS — Z880 Allergy status to penicillin: Secondary | ICD-10-CM | POA: Diagnosis not present

## 2017-12-17 LAB — TROPONIN I: Troponin I: <0.03 ng/mL (ref <0.03)

## 2017-12-17 LAB — CBC WITH DIFFERENTIAL/PLATELET
BASOS ABS: 0.1 10*3/uL (ref 0–0.1)
Basophils Relative: 1 %
EOS ABS: 0.2 10*3/uL (ref 0–0.7)
Eosinophils Relative: 2 %
HCT: 34.7 % — ABNORMAL LOW (ref 40.0–52.0)
HEMOGLOBIN: 11.9 g/dL — AB (ref 13.0–18.0)
LYMPHS ABS: 0.9 10*3/uL — AB (ref 1.0–3.6)
Lymphocytes Relative: 9 %
MCH: 30.1 pg (ref 26.0–34.0)
MCHC: 34.4 g/dL (ref 32.0–36.0)
MCV: 87.6 fL (ref 80.0–100.0)
Monocytes Absolute: 1 10*3/uL (ref 0.2–1.0)
Monocytes Relative: 10 %
NEUTROS PCT: 78 %
Neutro Abs: 8.4 10*3/uL — ABNORMAL HIGH (ref 1.4–6.5)
Platelets: 308 10*3/uL (ref 150–440)
RBC: 3.95 MIL/uL — ABNORMAL LOW (ref 4.40–5.90)
RDW: 13.7 % (ref 11.5–14.5)
WBC: 10.6 10*3/uL (ref 3.8–10.6)

## 2017-12-17 LAB — COMPREHENSIVE METABOLIC PANEL
ALBUMIN: 3.7 g/dL (ref 3.5–5.0)
ALK PHOS: 86 U/L (ref 38–126)
ALT: 34 U/L (ref 17–63)
AST: 34 U/L (ref 15–41)
Anion gap: 11 (ref 5–15)
BUN: 15 mg/dL (ref 6–20)
CALCIUM: 9.7 mg/dL (ref 8.9–10.3)
CO2: 27 mmol/L (ref 22–32)
CREATININE: 0.7 mg/dL (ref 0.61–1.24)
Chloride: 90 mmol/L — ABNORMAL LOW (ref 101–111)
GFR calc non Af Amer: >60 mL/min (ref >60)
GLUCOSE: 185 mg/dL — AB (ref 65–99)
Potassium: 4.1 mmol/L (ref 3.5–5.1)
SODIUM: 128 mmol/L — AB (ref 135–145)
Total Bilirubin: 1.2 mg/dL (ref 0.3–1.2)
Total Protein: 7.8 g/dL (ref 6.5–8.1)

## 2017-12-17 LAB — LIPASE, BLOOD: Lipase: 17 U/L (ref 11–51)

## 2017-12-17 LAB — GLUCOSE, CAPILLARY
GLUCOSE-CAPILLARY: 143 mg/dL — AB (ref 65–99)
GLUCOSE-CAPILLARY: 123 mg/dL — AB (ref 65–99)
GLUCOSE-CAPILLARY: 136 mg/dL — AB (ref 65–99)
GLUCOSE-CAPILLARY: 131 mg/dL — AB (ref 65–99)

## 2017-12-17 MED ORDER — HYDROMORPHONE HCL 1 MG/ML IJ SOLN
1.0000 mg | Freq: Once | INTRAMUSCULAR | Status: AC
  Administered 2017-12-17: 1 mg via INTRAVENOUS
  Filled 2017-12-17: qty 1

## 2017-12-17 MED ORDER — CALCIUM CARBONATE ANTACID 500 MG PO CHEW
1.0000 | CHEWABLE_TABLET | Freq: Two times a day (BID) | ORAL | Status: DC
  Administered 2017-12-17 – 2017-12-19 (×5): 200 mg via ORAL
  Filled 2017-12-17 (×5): qty 1

## 2017-12-17 MED ORDER — OXYBUTYNIN CHLORIDE 5 MG PO TABS: 5.0000 mg | ORAL_TABLET | Freq: Three times a day (TID) | ORAL | Status: DC | PRN

## 2017-12-17 MED ORDER — ONDANSETRON HCL 4 MG PO TABS: 4.0000 mg | ORAL_TABLET | Freq: Four times a day (QID) | ORAL | Status: DC | PRN

## 2017-12-17 MED ORDER — SODIUM CHLORIDE 0.9 % IV BOLUS
1000.0000 mL | Freq: Once | INTRAVENOUS | Status: AC
  Administered 2017-12-17: 1000 mL via INTRAVENOUS

## 2017-12-17 MED ORDER — ONDANSETRON HCL 4 MG/2ML IJ SOLN: 4.0000 mg | Freq: Four times a day (QID) | INTRAMUSCULAR | Status: DC | PRN

## 2017-12-17 MED ORDER — ONDANSETRON HCL 4 MG/2ML IJ SOLN
4.0000 mg | Freq: Once | INTRAMUSCULAR | Status: AC
  Administered 2017-12-17: 4 mg via INTRAVENOUS
  Filled 2017-12-17: qty 2

## 2017-12-17 MED ORDER — POLYETHYLENE GLYCOL 3350 17 G PO PACK
17.0000 g | PACK | Freq: Two times a day (BID) | ORAL | Status: DC
  Administered 2017-12-17 – 2017-12-19 (×4): 17 g via ORAL
  Filled 2017-12-17 (×5): qty 1

## 2017-12-17 MED ORDER — SODIUM CHLORIDE 0.9 % IV SOLN
INTRAVENOUS | Status: DC
  Administered 2017-12-17 – 2017-12-19 (×5): via INTRAVENOUS

## 2017-12-17 MED ORDER — ATORVASTATIN CALCIUM 20 MG PO TABS
40.0000 mg | ORAL_TABLET | Freq: Every day | ORAL | Status: DC
  Administered 2017-12-17 – 2017-12-19 (×3): 40 mg via ORAL
  Filled 2017-12-17 (×3): qty 2

## 2017-12-17 MED ORDER — DOCUSATE SODIUM 100 MG PO CAPS
100.0000 mg | ORAL_CAPSULE | Freq: Two times a day (BID) | ORAL | Status: DC
  Administered 2017-12-17 – 2017-12-18 (×3): 100 mg via ORAL
  Filled 2017-12-17 (×3): qty 1

## 2017-12-17 MED ORDER — ACETAMINOPHEN 325 MG PO TABS: 650.0000 mg | ORAL_TABLET | Freq: Four times a day (QID) | ORAL | Status: DC | PRN

## 2017-12-17 MED ORDER — MORPHINE SULFATE (PF) 2 MG/ML IV SOLN
2.0000 mg | INTRAVENOUS | Status: DC | PRN
  Administered 2017-12-17 – 2017-12-19 (×6): 2 mg via INTRAVENOUS
  Filled 2017-12-17 (×8): qty 1

## 2017-12-17 MED ORDER — ACETAMINOPHEN 650 MG RE SUPP: 650.0000 mg | Freq: Four times a day (QID) | RECTAL | Status: DC | PRN

## 2017-12-17 MED ORDER — CLOBETASOL PROPIONATE 0.05 % EX CREA
1.0000 "application " | TOPICAL_CREAM | Freq: Two times a day (BID) | CUTANEOUS | Status: DC
  Administered 2017-12-18: 1 via TOPICAL
  Filled 2017-12-17: qty 15

## 2017-12-17 MED ORDER — INSULIN ASPART 100 UNIT/ML ~~LOC~~ SOLN
0.0000 [IU] | Freq: Three times a day (TID) | SUBCUTANEOUS | Status: DC
  Administered 2017-12-17 (×3): 2 [IU] via SUBCUTANEOUS
  Administered 2017-12-18 (×3): 3 [IU] via SUBCUTANEOUS
  Administered 2017-12-19: 2 [IU] via SUBCUTANEOUS
  Filled 2017-12-17 (×7): qty 1

## 2017-12-17 MED ORDER — FLUTICASONE PROPIONATE 50 MCG/ACT NA SUSP
2.0000 | Freq: Every day | NASAL | Status: DC
  Administered 2017-12-17 – 2017-12-18 (×2): 2 via NASAL
  Filled 2017-12-17: qty 16

## 2017-12-17 MED ORDER — OXYCODONE HCL 5 MG PO TABS
5.0000 mg | ORAL_TABLET | Freq: Four times a day (QID) | ORAL | Status: DC | PRN
Start: 2017-12-17 — End: 2017-12-19
  Administered 2017-12-17 – 2017-12-18 (×4): 10 mg via ORAL
  Filled 2017-12-17 (×4): qty 2

## 2017-12-17 MED ORDER — ALPRAZOLAM 0.25 MG PO TABS
0.2500 mg | ORAL_TABLET | Freq: Three times a day (TID) | ORAL | Status: DC | PRN
  Administered 2017-12-17 – 2017-12-18 (×3): 0.25 mg via ORAL
  Filled 2017-12-17 (×3): qty 1

## 2017-12-17 MED ORDER — TAMSULOSIN HCL 0.4 MG PO CAPS
0.4000 mg | ORAL_CAPSULE | Freq: Every day | ORAL | Status: DC
  Administered 2017-12-17 – 2017-12-19 (×3): 0.4 mg via ORAL
  Filled 2017-12-17 (×3): qty 1

## 2017-12-17 MED ORDER — ENOXAPARIN SODIUM 40 MG/0.4ML ~~LOC~~ SOLN
40.0000 mg | SUBCUTANEOUS | Status: DC
  Administered 2017-12-17: 40 mg via SUBCUTANEOUS
  Filled 2017-12-17: qty 0.4

## 2017-12-17 MED ORDER — PANTOPRAZOLE SODIUM 40 MG PO TBEC
40.0000 mg | DELAYED_RELEASE_TABLET | Freq: Every day | ORAL | Status: DC
  Administered 2017-12-17 – 2017-12-19 (×3): 40 mg via ORAL
  Filled 2017-12-17 (×3): qty 1

## 2017-12-17 MED ORDER — METOCLOPRAMIDE HCL 5 MG PO TABS
5.0000 mg | ORAL_TABLET | Freq: Four times a day (QID) | ORAL | Status: DC | PRN
  Administered 2017-12-17 – 2017-12-18 (×3): 5 mg via ORAL
  Filled 2017-12-17 (×3): qty 1

## 2017-12-17 MED ORDER — CARVEDILOL 12.5 MG PO TABS
12.5000 mg | ORAL_TABLET | Freq: Two times a day (BID) | ORAL | Status: DC
  Administered 2017-12-17 – 2017-12-19 (×5): 12.5 mg via ORAL
  Filled 2017-12-17: qty 4
  Filled 2017-12-17 (×2): qty 1
  Filled 2017-12-17: qty 4
  Filled 2017-12-17 (×2): qty 1
  Filled 2017-12-17 (×2): qty 4
  Filled 2017-12-17 (×2): qty 1

## 2017-12-17 MED ORDER — INSULIN ASPART 100 UNIT/ML ~~LOC~~ SOLN: 0.0000 [IU] | Freq: Every day | SUBCUTANEOUS | Status: DC

## 2017-12-17 MED ORDER — METFORMIN HCL 500 MG PO TABS
1000.0000 mg | ORAL_TABLET | Freq: Two times a day (BID) | ORAL | Status: DC
Start: 2017-12-17 — End: 2017-12-19
  Administered 2017-12-17 – 2017-12-18 (×3): 1000 mg via ORAL
  Filled 2017-12-17 (×5): qty 2

## 2017-12-17 MED ORDER — LOSARTAN POTASSIUM 50 MG PO TABS
100.0000 mg | ORAL_TABLET | Freq: Every day | ORAL | Status: DC
  Administered 2017-12-17 – 2017-12-19 (×3): 100 mg via ORAL
  Filled 2017-12-17 (×3): qty 2

## 2017-12-17 NOTE — ED Notes (Signed)
Pt assisted to wheelchair upon arrival; c/o abd pain for several days; pt noted to be pale

## 2017-12-17 NOTE — ED Notes (Signed)
In to answer pt's call bell; he had called out for more pain medication; found pt standing beside the bed with side rails up and call bell in reach; explained to pt that he has already been given a very strong pain medication and standing up could result in a fall; explained to pt that I had obtained another order for pain medication but it is extremely important, that for his safety, he does not get up again without staff assistance; pt verbalized understanding;

## 2017-12-17 NOTE — ED Notes (Signed)
This RN to bedside at this time. Introduced self to patient and his wife. Pt states, "the pain is starting to come back a little bit but not as quickly as before". Pt is alert and oriented. Pt repositioned in bed for patient comfort, lights dimmed for comfort per patient request. BP cuff placed back on patient. Will continue to monitor for further patient needs.

## 2017-12-17 NOTE — Progress Notes (Signed)
START ON PATHWAY REGIMEN - Bladder     A cycle is every 21 days:     Carboplatin      Gemcitabine   **Always confirm dose/schedule in your pharmacy ordering system**  Patient Characteristics: Metastatic Disease, First Line, No Prior Neoadjuvant/Adjuvant Therapy, Poor Renal Function (CrCl < 50 mL/min), Unknown PD-L1 Expression AJCC M Category: M1a AJCC N Category: N2 AJCC T Category: T3 Current evidence of distant metastases<= Yes AJCC 8 Stage Grouping: IVA Line of Therapy: First Line Prior Neoadjuvant/Adjuvant Therapy<= No Renal Function: Poor Renal Function (CrCl < 50 mL/min) PD-L1 Expression Status: Unknown PD-L1 Expression Intent of Therapy: Non-Curative / Palliative Intent, Discussed with Patient

## 2017-12-17 NOTE — ED Notes (Addendum)
Pt reports being diagnosed with stage 4 kidney cancer around memorial day weekend; has been to multiple appointments for further evaluation and had a biopsy done the beginning of the week; pt says he was prescribed oxycodone for pain, which helped for a few days, but is no longer helping; pt says his abd hurts more after eating and drinking so he hasn't had much intake over the last several days; pt also reports pain across his entire chest and abd; pt restless in bed and tearful at times; lungs clear; abd soft, bowel sounds present x 4;

## 2017-12-17 NOTE — Consult Note (Signed)
[facility]  Date of admission:  12/17/2017  Inpatient day:  12/17/2017  Consulting physician:  Dr. Reatha Harps Pyreddy  Reason for Consultation:  Metastatic urothelial carcinoma; abdominal pain  Chief Complaint: Patrick Mcgee is a 72 y.o. male with metastatic urothelial carcinoma who was admitted through the emergency room with abdominal pain and dehydration.  HPI:  The patient initially presented with abdominal pain and weight loss in 11/2017.  CT scans revealed significant abdominal and pelvic adenopathy.  He underwent left ureteroscopy, ureteral stent placement, and renal pelvis biopsy on 12/10/2017 confirming grade urothelial carcinoma.    He was last seen in the medical oncology clinic 12/13/2017.  At that time, pain was controlled with oxycodone 5 mg with supplimental Tylenol 500 mg.  He underwent stent removal on 12/14/2017.  He has had some slight hematuria since that time.  He denies any bladder spasms.    He states that his worsened over the weekend involving his middle abdomen and up under his ribs on the right side.  He describes constipation for the past 3 days.  He feels bloated.  He feels some relief with belching as it takes away the pressure.  Eating causes pain as he feels distended.  He is scheduled for port-a-cath placement on 12/19/2017 with Dr Lucky Cowboy.  Chemotherapy is scheduled to begin 12/21/2017.   Past Medical History:  Diagnosis Date  . Acid reflux   . Anxiety   . Cancer ([place])    renal mass  . Depression   . Diabetes mellitus without complication ([place])   . Hyperlipidemia   . Hypertension   . Myocardial infarction ([place]) 1995  . Sleep apnea     Past Surgical History:  Procedure Laterality Date  . CATARACT EXTRACTION Left   . COLONOSCOPY  2010   [facility]  . COLONOSCOPY WITH PROPOFOL N/A 10/04/2016   Procedure: COLONOSCOPY WITH PROPOFOL;  Surgeon: [name], MD;  Location: [facility] ENDOSCOPY;  Service: Endoscopy;  Laterality: N/A;  . [place], 2000, 2001, 2014  . CYSTOSCOPY W/ RETROGRADES Bilateral 12/10/2017   Procedure: CYSTOSCOPY WITH RETROGRADE PYELOGRAM;  Surgeon: [name], MD;  Location: [facility] ORS;  Service: Urology;  Laterality: Bilateral;  . CYSTOSCOPY WITH STENT PLACEMENT Left 12/10/2017   Procedure: CYSTOSCOPY WITH STENT PLACEMENT;  Surgeon: [name], MD;  Location: [facility] ORS;  Service: Urology;  Laterality: Left;  . INGUINAL HERNIA REPAIR Right 08/09/2015   Procedure: HERNIA REPAIR INGUINAL ADULT;  Surgeon: [name], MD;  Location: [facility] ORS;  Service: General;  Laterality: Right;  . NASAL SINUS SURGERY    . nuclear stress test    . URETERAL BIOPSY Left 12/10/2017   Procedure: URETERAL & renal PELVIS BIOPSY;  Surgeon: [name], MD;  Location: [facility] ORS;  Service: Urology;  Laterality: Left;  . URETEROSCOPY Left 12/10/2017   Procedure: URETEROSCOPY;  Surgeon: [name], MD;  Location: [facility] ORS;  Service: Urology;  Laterality: Left;    Family History  Problem Relation Age of Onset  . Heart disease Mother   . Cancer Father        Lung and colon cancer  . Heart disease Father   . Emphysema Maternal Grandfather   . Tuberculosis Maternal Grandmother     Social History:  reports that he has never smoked. He has quit using smokeless tobacco. His smokeless tobacco use included chew. He reports that he drinks about 0.6 - 3.0 oz of alcohol per week. He reports  that he does not use drugs.  The patient is alone today.  Allergies:  Allergies  Allergen Reactions  . Sulfa Antibiotics Other (See Comments)    Joint pain  . Ace Inhibitors Cough  . Invokana [Canagliflozin] Other (See Comments)    Leg pain  . Penicillins Rash    Has patient had a PCN reaction causing immediate rash, facial/tongue/throat swelling, SOB or lightheadedness with hypotension: Yes Has patient had a PCN reaction causing severe rash involving mucus membranes or skin necrosis: No Has  patient had a PCN reaction that required hospitalization: No Has patient had a PCN reaction occurring within the last 10 years: No If all of the above answers are "NO", then may proceed with Cephalosporin use.    Medications Prior to Admission  Medication Sig Dispense Refill  . acetaminophen (TYLENOL) 500 MG tablet Take 500 mg by mouth every 6 (six) hours as needed for moderate pain. When taking oxycodone 5 mg tablets    . amoxicillin (AMOXIL) 500 MG capsule Take 2,000 mg by mouth once. 1 hour prior to dental appointment    . aspirin EC 81 MG tablet Take 81 mg by mouth at bedtime.    Marland Kitchen atorvastatin (LIPITOR) 40 MG tablet TAKE ONE TABLET BY MOUTH ONCE DAILY (Patient taking differently: TAKE ONE TABLET BY MOUTH DAILY AT BEDTIME) 90 tablet 3  . carvedilol (COREG) 12.5 MG tablet TAKE ONE TABLET BY MOUTH TWICE DAILY 180 tablet 3  . clobetasol cream (TEMOVATE) 1.01 % Apply 1 application topically as needed (for skin rash).     . fluticasone (FLONASE) 50 MCG/ACT nasal spray Place 2 sprays into both nostrils daily. (Patient taking differently: Place 2 sprays into both nostrils daily as needed for allergies or rhinitis. ) 16 g 12  . glucose blood (CONTOUR NEXT TEST) test strip Checks sugar 4 times daily. DX E11.9-strips for Contour next EZ meter 360 each 3  . Insulin Pen Needle 31G X 5 MM MISC Needs BD ultra fine mini needles to use twice daily DX E11.9 100 each 3  . Ivermectin (SOOLANTRA) 1 % CREA Apply topically at bedtime. To face    . losartan (COZAAR) 100 MG tablet TAKE ONE TABLET BY MOUTH ONCE DAILY (Patient taking differently: TAKE ONE TABLET BY MOUTH DAILY AT BEDTIME) 90 tablet 3  . meclizine (ANTIVERT) 25 MG tablet Take 25 mg by mouth 3 (three) times daily as needed for dizziness.    . metFORMIN (GLUCOPHAGE) 1000 MG tablet Take 1 tablet (1,000 mg total) by mouth 2 (two) times daily with a meal. 180 tablet 3  . nystatin cream (MYCOSTATIN) APPLY  CREAM TOPICALLY TWICE DAILY AROUND  THE  GROIN  AREA  (Patient taking differently: APPLY  CREAM TOPICALLY TWICE DAILY AROUND  THE  GROIN  AREA AS NEEDED FOR SKIN RASH) 15 g 1  . oxybutynin (DITROPAN) 5 MG tablet Take 1 tablet (5 mg total) by mouth every 8 (eight) hours as needed for bladder spasms. 30 tablet 0  . oxyCODONE (OXY IR/ROXICODONE) 5 MG immediate release tablet Take 1-2 tablets (5-10 mg total) by mouth every 6 (six) hours as needed for severe pain. 40 tablet 0  . oxyCODONE-acetaminophen (PERCOCET) 5-325 MG tablet Take 1-2 tablets by mouth every 4 (four) hours as needed for moderate pain or severe pain. 12 tablet 0  . polyethylene glycol (MIRALAX) packet Take 17 g by mouth 2 (two) times daily. (Patient taking differently: Take 17 g by mouth 2 (two) times daily as needed for moderate  constipation. ) 14 each 0  . senna-docusate (SENOKOT-S) 8.6-50 MG tablet Take 2 tablets by mouth at bedtime as needed for moderate constipation.    . tamsulosin (FLOMAX) 0.4 MG CAPS capsule Take 1 capsule (0.4 mg total) by mouth daily. 30 capsule 0  . amLODipine (NORVASC) 5 MG tablet TAKE 1 TABLET BY MOUTH ONCE DAILY (Patient not taking: Reported on 12/17/2017) 90 tablet 3  . docusate sodium (COLACE) 100 MG capsule Take 1 capsule (100 mg total) by mouth 2 (two) times daily. (Patient not taking: Reported on 12/17/2017) 20 capsule 0  . empagliflozin (JARDIANCE) 10 MG TABS tablet Take 10 mg by mouth daily. (Patient not taking: Reported on 12/07/2017) 30 tablet 0  . ondansetron (ZOFRAN ODT) 4 MG disintegrating tablet Take 1 tablet (4 mg total) by mouth every 8 (eight) hours as needed for nausea or vomiting. (Patient not taking: Reported on 12/17/2017) 20 tablet 0    Review of Systems: GENERAL:  Fatigue.  No fevers, sweats or weight loss. PERFORMANCE STATUS (ECOG):  1-2 HEENT:  No visual changes, runny nose, sore throat, mouth sores or tenderness. Lungs: No shortness of breath or cough.  No hemoptysis. Cardiac:  h/o MI s/p stent x 5.  No chest pain, palpitations,  orthopnea, or PND. GI:  Reflux.  Bloating.  Constipation.  Some nausea.  No vomiting, diarrhea, melena or hematochezia. GU:  Some hematuria s/p stent removal.  No urgency, frequency, or dysuria. Musculoskeletal:  No back pain.  No joint pain.  No muscle tenderness. Extremities:  No pain or swelling. Skin:  No rashes or skin changes. Neuro:  Diabetic neuropathy in feet.  No headache, or weakness, balance or coordination issues. Endocrine: Diabetes.  No thyroid issues, hot flashes or night sweats. Psych:  No mood changes, depression or anxiety. Pain:  No focal pain. Review of systems:  All other systems reviewed and found to be negative.  Physical Exam:  Blood pressure 131/82, pulse 72, temperature 97.8 F (36.6 C), temperature source Oral, resp. rate 16, height 5' 8" (1.727 m), weight 161 lb 4.8 oz (73.2 kg), SpO2 98 %.  GENERAL:  Fatigued appearing gentleman lying comfortably on the medical unit in no acute distress. MENTAL STATUS:  Alert and oriented to person, place and time. HEAD:  Thin white hair.  Normocephalic, atraumatic, face symmetric, no Cushingoid features. EYES:  [place] eyes.  Pupils equal round and reactive to light and accomodation.  No conjunctivitis or scleral icterus. ENT:  Oropharynx clear without lesion.  Tongue normal. Mucous membranes moist.  RESPIRATORY:  Clear to auscultation without rales, wheezes or rhonchi. CARDIOVASCULAR:  Regular rate and rhythm without murmur, rub or gallop. ABDOMEN:  Soft, minimally tender to palpation in mid abdomen.  No guarding or rebound tenderness.  Active bowel sounds, and no hepatosplenomegaly.  No masses. SKIN:  No rashes, ulcers or lesions. EXTREMITIES: No edema, no skin discoloration or tenderness.  No palpable cords. LYMPH NODES: No palpable cervical, supraclavicular, axillary or inguinal adenopathy  NEUROLOGICAL: Unremarkable. PSYCH:  Appropriate.   Results for orders placed or performed during the hospital encounter of 12/17/17  (from the past 48 hour(s))  CBC with Differential     Status: Abnormal   Collection Time: 12/17/17  1:48 AM  Result Value Ref Range   WBC 10.6 3.8 - 10.6 K/uL   RBC 3.95 (L) 4.40 - 5.90 MIL/uL   Hemoglobin 11.9 (L) 13.0 - 18.0 g/dL   HCT 34.7 (L) 40.0 - 52.0 %   MCV 87.6 80.0 -  100.0 fL   MCH 30.1 26.0 - 34.0 pg   MCHC 34.4 32.0 - 36.0 g/dL   RDW 13.7 11.5 - 14.5 %   Platelets 308 150 - 440 K/uL   Neutrophils Relative % 78 %   Neutro Abs 8.4 (H) 1.4 - 6.5 K/uL   Lymphocytes Relative 9 %   Lymphs Abs 0.9 (L) 1.0 - 3.6 K/uL   Monocytes Relative 10 %   Monocytes Absolute 1.0 0.2 - 1.0 K/uL   Eosinophils Relative 2 %   Eosinophils Absolute 0.2 0 - 0.7 K/uL   Basophils Relative 1 %   Basophils Absolute 0.1 0 - 0.1 K/uL    Comment: Performed at [facility], [address] [place], [place] [address]  Comprehensive metabolic panel     Status: Abnormal   Collection Time: 12/17/17  1:48 AM  Result Value Ref Range   Sodium 128 (L) 135 - 145 mmol/L   Potassium 4.1 3.5 - 5.1 mmol/L   Chloride 90 (L) 101 - 111 mmol/L   CO2 27 22 - 32 mmol/L   Glucose, Bld 185 (H) 65 - 99 mg/dL   BUN 15 6 - 20 mg/dL   Creatinine, Ser 0.70 0.61 - 1.24 mg/dL   Calcium 9.7 8.9 - 10.3 mg/dL   Total Protein 7.8 6.5 - 8.1 g/dL   Albumin 3.7 3.5 - 5.0 g/dL   AST 34 15 - 41 U/L   ALT 34 17 - 63 U/L   Alkaline Phosphatase 86 38 - 126 U/L   Total Bilirubin 1.2 0.3 - 1.2 mg/dL   GFR calc non Af Amer >60 >60 mL/min   GFR calc Af Amer >60 >60 mL/min    Comment: (NOTE) The eGFR has been calculated using the CKD EPI equation. This calculation has not been validated in all clinical situations. eGFR's persistently <60 mL/min signify possible Chronic Kidney Disease.    Anion gap 11 5 - 15    Comment: Performed at [facility], [address] [place], [place] [address]  Lipase, blood     Status: None   Collection Time: 12/17/17  1:48 AM  Result Value Ref Range   Lipase 17 11 - 51 U/L     Comment: Performed at [facility], [address] [place], [place] [address]  Troponin I     Status: None   Collection Time: 12/17/17  1:48 AM  Result Value Ref Range   Troponin I <0.03 <0.03 ng/mL    Comment: Performed at [facility], [address] [place], [place] [address]  Glucose, capillary     Status: Abnormal   Collection Time: 12/17/17  8:35 AM  Result Value Ref Range   Glucose-Capillary 143 (H) 65 - 99 mg/dL  Glucose, capillary     Status: Abnormal   Collection Time: 12/17/17 11:50 AM  Result Value Ref Range   Glucose-Capillary 123 (H) 65 - 99 mg/dL  Glucose, capillary     Status: Abnormal   Collection Time: 12/17/17  4:57 PM  Result Value Ref Range   Glucose-Capillary 136 (H) 65 - 99 mg/dL   No results found.  Assessment:  The patient is a 72 y.o. gentleman with metastatic left urothelial carcinoma arising from the left renal pelvis.  He was admitted from the ER with abdominal pain likely from abdominal and pelvic adenopathy, constipation, and bloating.  Oral intake has been diminished secondary to constipation and bloating.  He has felt better since initiation of fluids and IV  pain medications.  Plan:   1.  Oncology:  Metastatic urothelial carcinoma.  Planned port-a-cath placement with Dr. Lucky Cowboy on 12/19/2017 and initiation of chemotherapy on 12/21/2017.  2.  Gastroenterology:  Constipation and bloating aggravating known abdominal disease.  Bowel regimen and Mylicon.  3.  Fluid/electrolytes/nutrition:  IVF to correct dehydration.  Anticipate advancing diet once constipation and bloating improved.  4.  Pain and Toxicology:  Oxycodone 5-10 mg po q 6 hours (outpatient regimen) with morphine  IV q 4 hours prn.  If pain medication use significant would start long acting pain medication.  5. Disposition:  Anticipate patient will remain hospitalized until pain controlled and bowels moving well.   Thank you for allowing me to participate in Patrick Mcgee 's care.  I will follow him closely with you while hospitalized and after discharge in the outpatient department.    [name], MD  12/17/2017, 5:12 PM

## 2017-12-17 NOTE — ED Notes (Signed)
Back to sub wait to speak with pt and his wife upon request of ED secretary; wife had come to her desk stating pt needs to be admitted to the hospital and wanting to know when he will have a room; explained to wife that pt is still waiting for the treatment room and I encouraged her to discuss her concerns with the MD; wife says pt needs to be admitted for pain control and nutrition, she says she can't take him home like this;

## 2017-12-17 NOTE — Progress Notes (Signed)
Advanced care plan. Purpose of the Encounter: CODE STATUS Parties in Attendance: Patient and family Patient's Decision Capacity: Good Subjective/Patient's story: Presented to the emergency room with weakness and abdominal discomfort Objective/Medical story He is dehydrated and has a low sodium level And has history of urothelial carcinoma of the left kidney Goals of care determination:  Advance care directives and goals of care discussed with the patient For now patient wants everything done which includes cardiac resuscitation intubation and ventilator if the need arises CODE STATUS: Full code Time spent discussing advanced care planning: 16 minutes

## 2017-12-17 NOTE — H&P (Signed)
[place] [facility] [place] NAME: Patrick Mcgee    MR#:  [id number]  DATE OF BIRTH:  [birth year]  DATE OF ADMISSION:  12/17/2017  PRIMARY CARE PHYSICIAN: Jerrol Banana., MD   REQUESTING/REFERRING PHYSICIAN:   CHIEF COMPLAINT:   Chief Complaint  Patient presents with  . Pain Control    HISTORY OF PRESENT ILLNESS: Patrick Mcgee  is a 72 y.o. male with a known history of urothelial carcinoma left kidney, diabetes mellitus type 2, hyperlipidemia, hypertension, sleep apnea presented to the emergency room with abdominal discomfort.  Patient has had lower abdominal pain.  Abdominal pain is aching in nature 6 out of 10 on a scale of 1-10.  Patient is on oral narcotic medication for pain but it is not controlling it well.  He has generalized weakness.  He appears dry and dehydrated.  Evaluation in the emergency room showed a sodium level of 07/29/2016 patient was started on IV fluids.  He is due for Port-A-Cath placement on Wednesday.  He follows up with oncology in the clinic.  Hospitalist service was consulted for further care.  PAST MEDICAL HISTORY:   Past Medical History:  Diagnosis Date  . Acid reflux   . Anxiety   . Cancer ([place])    renal mass  . Depression   . Diabetes mellitus without complication ([place])   . Hyperlipidemia   . Hypertension   . Myocardial infarction ([place]) 1995  . Sleep apnea     PAST SURGICAL HISTORY:  Past Surgical History:  Procedure Laterality Date  . CATARACT EXTRACTION Left   . COLONOSCOPY  2010   [facility]  . COLONOSCOPY WITH PROPOFOL N/A 10/04/2016   Procedure: COLONOSCOPY WITH PROPOFOL;  Surgeon: [name], MD;  Location: [facility] ENDOSCOPY;  Service: Endoscopy;  Laterality: N/A;  . [place], 2000, 2001, 2014  . CYSTOSCOPY W/ RETROGRADES Bilateral 12/10/2017   Procedure: CYSTOSCOPY WITH RETROGRADE PYELOGRAM;  Surgeon: [name], MD;  Location: [facility] ORS;   Service: Urology;  Laterality: Bilateral;  . CYSTOSCOPY WITH STENT PLACEMENT Left 12/10/2017   Procedure: CYSTOSCOPY WITH STENT PLACEMENT;  Surgeon: [name], MD;  Location: [facility] ORS;  Service: Urology;  Laterality: Left;  . INGUINAL HERNIA REPAIR Right 08/09/2015   Procedure: HERNIA REPAIR INGUINAL ADULT;  Surgeon: [name], MD;  Location: [facility] ORS;  Service: General;  Laterality: Right;  . NASAL SINUS SURGERY    . nuclear stress test    . URETERAL BIOPSY Left 12/10/2017   Procedure: URETERAL & renal PELVIS BIOPSY;  Surgeon: [name], MD;  Location: [facility] ORS;  Service: Urology;  Laterality: Left;  . URETEROSCOPY Left 12/10/2017   Procedure: URETEROSCOPY;  Surgeon: [name], MD;  Location: [facility] ORS;  Service: Urology;  Laterality: Left;    SOCIAL HISTORY:  Social History   Tobacco Use  . Smoking status: Never Smoker  . Smokeless tobacco: Former Systems developer    Types: Chew  Substance Use Topics  . Alcohol use: Yes    Alcohol/week: 0.6 - 3.0 oz    Types: 1 Standard drinks or equivalent per week    Comment: rarely    FAMILY HISTORY:  Family History  Problem Relation Age of Onset  . Heart disease Mother   . Cancer Father        Lung and colon cancer  . Heart disease Father   . Emphysema Maternal Grandfather   . Tuberculosis Maternal Grandmother  DRUG ALLERGIES:  Allergies  Allergen Reactions  . Sulfa Antibiotics Other (See Comments)    Joint pain  . Ace Inhibitors Cough  . Invokana [Canagliflozin] Other (See Comments)    Leg pain  . Penicillins Rash    Has patient had a PCN reaction causing immediate rash, facial/tongue/throat swelling, SOB or lightheadedness with hypotension: Yes Has patient had a PCN reaction causing severe rash involving mucus membranes or skin necrosis: No Has patient had a PCN reaction that required hospitalization: No Has patient had a PCN reaction occurring within the last 10 years: No If all of the above answers are "NO", then  may proceed with Cephalosporin use.    REVIEW OF SYSTEMS:   CONSTITUTIONAL: No fever, has fatigue and weakness.  EYES: No blurred or double vision.  EARS, NOSE, AND THROAT: No tinnitus or ear pain.  RESPIRATORY: No cough, shortness of breath, wheezing or hemoptysis.  CARDIOVASCULAR: No chest pain, orthopnea, edema.  GASTROINTESTINAL: No nausea, vomiting, diarrhea  has abdominal pain.  GENITOURINARY: No dysuria, hematuria.  ENDOCRINE: No polyuria, nocturia,  HEMATOLOGY: No anemia, easy bruising or bleeding SKIN: No rash or lesion. MUSCULOSKELETAL: No joint pain or arthritis.   NEUROLOGIC: No tingling, numbness, weakness.  PSYCHIATRY: No anxiety or depression.   MEDICATIONS AT HOME:  Prior to Admission medications   Medication Sig Start Date End Date Taking? Authorizing Provider  acetaminophen (TYLENOL) 500 MG tablet Take 500 mg by mouth every 6 (six) hours as needed for moderate pain. When taking oxycodone 5 mg tablets   Yes [provider]  amoxicillin (AMOXIL) 500 MG capsule Take 2,000 mg by mouth once. 1 hour prior to dental appointment   Yes [provider]  aspirin EC 81 MG tablet Take 81 mg by mouth at bedtime.   Yes [provider]  atorvastatin (LIPITOR) 40 MG tablet TAKE ONE TABLET BY MOUTH ONCE DAILY Patient taking differently: TAKE ONE TABLET BY MOUTH DAILY AT BEDTIME 04/16/17  Yes Jerrol Banana., MD  carvedilol (COREG) 12.5 MG tablet TAKE ONE TABLET BY MOUTH TWICE DAILY 07/12/17  Yes Jerrol Banana., MD  clobetasol cream (TEMOVATE) 6.73 % Apply 1 application topically as needed (for skin rash).    Yes [provider]  fluticasone (FLONASE) 50 MCG/ACT nasal spray Place 2 sprays into both nostrils daily. Patient taking differently: Place 2 sprays into both nostrils daily as needed for allergies or rhinitis.  09/19/17  Yes Jerrol Banana., MD  glucose blood (CONTOUR NEXT TEST) test strip Checks sugar 4 times daily. DX  E11.9-strips for Contour next EZ meter 05/21/17  Yes Jerrol Banana., MD  Insulin Pen Needle 31G X 5 MM MISC Needs BD ultra fine mini needles to use twice daily DX E11.9 02/22/17  Yes Jerrol Banana., MD  Ivermectin (SOOLANTRA) 1 % CREA Apply topically at bedtime. To face   Yes [provider]  losartan (COZAAR) 100 MG tablet TAKE ONE TABLET BY MOUTH ONCE DAILY Patient taking differently: TAKE ONE TABLET BY MOUTH DAILY AT BEDTIME 05/24/17  Yes Jerrol Banana., MD  meclizine (ANTIVERT) 25 MG tablet Take 25 mg by mouth 3 (three) times daily as needed for dizziness.   Yes [provider]  metFORMIN (GLUCOPHAGE) 1000 MG tablet Take 1 tablet (1,000 mg total) by mouth 2 (two) times daily with a meal. 06/14/17  Yes Jerrol Banana., MD  nystatin cream (MYCOSTATIN) APPLY  CREAM TOPICALLY TWICE DAILY AROUND  THE  GROIN  AREA Patient taking differently: APPLY  CREAM TOPICALLY TWICE DAILY AROUND  THE  GROIN  AREA AS NEEDED FOR SKIN RASH 04/10/16  Yes Jerrol Banana., MD  oxybutynin (DITROPAN) 5 MG tablet Take 1 tablet (5 mg total) by mouth every 8 (eight) hours as needed for bladder spasms. 12/10/17  Yes [name], MD  oxyCODONE (OXY IR/ROXICODONE) 5 MG immediate release tablet Take 1-2 tablets (5-10 mg total) by mouth every 6 (six) hours as needed for severe pain. 12/07/17  Yes Karen Kitchens, NP  oxyCODONE-acetaminophen (PERCOCET) 5-325 MG tablet Take 1-2 tablets by mouth every 4 (four) hours as needed for moderate pain or severe pain. 12/10/17  Yes [name], MD  polyethylene glycol [facility]) packet Take 17 g by mouth 2 (two) times daily. Patient taking differently: Take 17 g by mouth 2 (two) times daily as needed for moderate constipation.  12/05/17  Yes Loney Hering, MD  senna-docusate (SENOKOT-S) 8.6-50 MG tablet Take 2 tablets by mouth at bedtime as needed for moderate constipation.   Yes [provider]  tamsulosin (FLOMAX) 0.4 MG CAPS  capsule Take 1 capsule (0.4 mg total) by mouth daily. 12/10/17  Yes [name], MD  amLODipine (NORVASC) 5 MG tablet TAKE 1 TABLET BY MOUTH ONCE DAILY Patient not taking: Reported on 12/17/2017 12/04/17   Jerrol Banana., MD  docusate sodium (COLACE) 100 MG capsule Take 1 capsule (100 mg total) by mouth 2 (two) times daily. Patient not taking: Reported on 12/17/2017 12/05/17 12/05/18  Loney Hering, MD  empagliflozin (JARDIANCE) 10 MG TABS tablet Take 10 mg by mouth daily. Patient not taking: Reported on 12/07/2017 11/23/17   Virginia Crews, MD  ondansetron (ZOFRAN ODT) 4 MG disintegrating tablet Take 1 tablet (4 mg total) by mouth every 8 (eight) hours as needed for nausea or vomiting. Patient not taking: Reported on 12/17/2017 12/01/17   Earleen Newport, MD      PHYSICAL EXAMINATION:   VITAL SIGNS: Blood pressure (!) 177/87, pulse 65, temperature 99 F (37.2 C), temperature source Oral, resp. rate 18, SpO2 93 %.  GENERAL:  72 y.o.-year-old patient lying in the bed with no acute distress.  EYES: Pupils equal, round, reactive to light and accommodation. No scleral icterus. Extraocular muscles intact.  HEENT: Head atraumatic, normocephalic. Oropharynx dry and nasopharynx clear.  NECK:  Supple, no jugular venous distention. No thyroid enlargement, no tenderness.  LUNGS: Normal breath sounds bilaterally, no wheezing, rales,rhonchi or crepitation. No use of accessory muscles of respiration.  CARDIOVASCULAR: S1, S2 normal. No murmurs, rubs, or gallops.  ABDOMEN: Soft, tenderness below umbilicus, nondistended. Bowel sounds present. No organomegaly or mass.  EXTREMITIES: No pedal edema, cyanosis, or clubbing.  NEUROLOGIC: Cranial nerves II through XII are intact. Muscle strength 5/5 in all extremities. Sensation intact. Gait not checked.  PSYCHIATRIC: The patient is alert and oriented x 3.  SKIN: No obvious rash, lesion, or ulcer.   LABORATORY PANEL:   CBC Recent Labs  Lab  12/13/17 1530 12/17/17 0148  WBC 8.3 10.6  HGB 11.7* 11.9*  HCT 34.0* 34.7*  PLT 320 308  MCV 87.6 87.6  MCH 30.1 30.1  MCHC 34.3 34.4  RDW 14.0 13.7  LYMPHSABS 1.2 0.9*  MONOABS 0.9 1.0  EOSABS 0.2 0.2  BASOSABS 0.1 0.1   ------------------------------------------------------------------------------------------------------------------  Chemistries  Recent Labs  Lab 12/17/17 0148  NA 128*  K 4.1  CL 90*  CO2 27  GLUCOSE 185*  BUN 15  CREATININE 0.70  CALCIUM 9.7  AST 34  ALT 34  ALKPHOS 86  BILITOT 1.2   ------------------------------------------------------------------------------------------------------------------ estimated creatinine clearance is 79.6 mL/min (by C-G formula based on SCr of 0.7 mg/dL). ------------------------------------------------------------------------------------------------------------------ No results for input(s): TSH, T4TOTAL, T3FREE, THYROIDAB in the last 72 hours.  Invalid input(s): FREET3   Coagulation profile No results for input(s): INR, PROTIME in the last 168 hours. ------------------------------------------------------------------------------------------------------------------- No results for input(s): DDIMER in the last 72 hours. -------------------------------------------------------------------------------------------------------------------  Cardiac Enzymes Recent Labs  Lab 12/17/17 0148  TROPONINI <0.03   ------------------------------------------------------------------------------------------------------------------ Invalid input(s): POCBNP  ---------------------------------------------------------------------------------------------------------------  Urinalysis    Component Value Date/Time   COLORURINE Straw 02/03/2013 2223   APPEARANCEUR Cloudy (A) 12/07/2017 1036   LABSPEC 1.004 02/03/2013 2223   PHURINE 7.0 02/03/2013 2223   GLUCOSEU Trace (A) 12/07/2017 1036   GLUCOSEU Negative 02/03/2013 2223    HGBUR Negative 02/03/2013 2223   BILIRUBINUR Negative 12/07/2017 1036   BILIRUBINUR Negative 02/03/2013 2223   KETONESUR Negative 02/03/2013 2223   PROTEINUR 1+ (A) 12/07/2017 1036   PROTEINUR Negative 02/03/2013 2223   UROBILINOGEN 0.2 02/16/2016 1641   NITRITE Negative 12/07/2017 1036   NITRITE Negative 02/03/2013 2223   LEUKOCYTESUR Trace (A) 12/07/2017 1036   LEUKOCYTESUR Negative 02/03/2013 2223     RADIOLOGY: No results found.  EKG: Orders placed or performed during the hospital encounter of 12/17/17  . ED EKG  . ED EKG    IMPRESSION AND PLAN:  72 year old male patient with history of for diabetes mellitus type 2, sleep apnea, hypertension, urothelial cancer of left kidney, hyperlipidemia presented to the emergency room with abdominal pain and weakness.  -Dehydration IV fluid hydration with normal saline  -Hyponatremia IV hydration with normal saline and follow-up sodium level  -Abdominal pain secondary to cancer Controlled pain with IV morphine, oral Percocet and oxycodone  -Urothelial cancer of left kidney Follow-up with oncology as outpatient for chemotherapy and further treatment plan  -DVT prophylaxis with subcu Lovenox daily  -Sleep apnea CPAP at bedtime  All the records are reviewed and case discussed with ED provider. Management plans discussed with the patient, family and they are in agreement.  CODE STATUS:Full code    Code Status Orders  (From admission, onward)        Start     Ordered   12/17/17 0917  Full code  Continuous     12/17/17 0916    Code Status History    This patient has a current code status but no historical code status.       TOTAL TIME TAKING CARE OF THIS PATIENT: 53 minutes.    Saundra Shelling M.D on 12/17/2017 at 9:24 AM  Between 7am to 6pm - Pager - [phone]  After 6pm go to [email or web address] - password [email or web address] [facility]  [facility]  Office  [phone]  CC: Primary care physician; Jerrol Banana., MD

## 2017-12-17 NOTE — ED Notes (Signed)
Pt says pain is down the right side of his abd at this time which is strange as his cancer is in the left kidney; pt says he always feels bloated and it hurts to eat anything; encouraged pt to eat and drink even though he doesn't feel well as his body need the fuel; pt again stating she wants her husband admitted-can't eat, can't drink, can't get any pain relief and "neither of [place] are getting any sleep";

## 2017-12-17 NOTE — ED Triage Notes (Signed)
Patient reports diagnosed with stage 4 cancer.  Patient to ED tonight with abdominal pain that states prescribed pain medications not working.  Family reports patient has not been able to eat.  Patient reports he has been vomiting even liquids.

## 2017-12-17 NOTE — Care Management (Signed)
RNCM spoke with RN regarding patient status. He is having significant pain requiring 2 IV doses of 2mg  Morphine along with PO pain management.

## 2017-12-17 NOTE — ED Provider Notes (Addendum)
[facility] Emergency Department Provider Note   ____________________________________________   First MD Initiated Contact with Patient 12/17/17 0448     (approximate)  I have reviewed the triage vital signs and the nursing notes.   HISTORY  Chief Complaint Pain Control    HPI Patrick Mcgee is a 72 y.o. male who comes into the hospital today with some abdominal pain.  The patient has a recent diagnosis of cancer and is been having multiple procedures to help diagnose and determine the staging and treatment of his cancer.  His wife states that they are getting ready to start him on chemotherapy and he is supposed to be getting a port placement.  Because the pain medicine was not working and they told him that if the pain is very severe he needed to come to the ER.  They tried to wait until he could be seen in the office but they state that he could not she reports that he is been on pain medicine but it has not been helping.  The patient has not been eating and not sleeping.  He paces the floor all night long.  The patient's wife states that she called the cancer center on Friday manage the pain at home.  The patient states that he has abdominal pain that goes into his chest.  He tried to take a pill at 1 AM but eating tends to make his symptoms worse.  The patient feels bloated.  She states that he will not eat.  He had a urinary procedure in states that he still has some blood in his urine.  He denies any vomiting or nausea but he does gag whenever he eats.  The patient's wife brought him into the hospital today for treatment of his pain and further evaluation of his symptoms.   Past Medical History:  Diagnosis Date  . Acid reflux   . Anxiety   . Cancer ([place])    renal mass  . Depression   . Diabetes mellitus without complication ([place])   . Hyperlipidemia   . Hypertension   . Myocardial infarction ([place]) 1995  . Sleep apnea     Patient Active Problem List     Diagnosis Date Noted  . Cancer associated pain 12/17/2017  . Goals of care, counseling/discussion 12/13/2017  . Urothelial carcinoma of kidney, left ([place]) 12/07/2017  . Right inguinal hernia 07/17/2015  . Family history of colon cancer 07/17/2015  . Allergic rhinitis 11/12/2014  . Airway hyperreactivity 11/12/2014  . Atherosclerosis of coronary artery 11/12/2014  . Cheilitis 11/12/2014  . Narrowing of intervertebral disc space 11/12/2014  . Deflected nasal septum 11/12/2014  . HLD (hyperlipidemia) 11/12/2014  . BP (high blood pressure) 11/12/2014  . Adult hypothyroidism 11/12/2014  . Adiposity 11/12/2014  . Sleep apnea 11/12/2014  . Diabetes mellitus, type 2 ([place]) 11/12/2014  . Arteriosclerosis of coronary artery 10/02/2012  . Degenerative disc disease, lumbar 09/24/2012  . Furunculosis 04/23/2012    Past Surgical History:  Procedure Laterality Date  . CATARACT EXTRACTION Left   . COLONOSCOPY  2010   [facility]  . COLONOSCOPY WITH PROPOFOL N/A 10/04/2016   Procedure: COLONOSCOPY WITH PROPOFOL;  Surgeon: [name], MD;  Location: [facility] ENDOSCOPY;  Service: Endoscopy;  Laterality: N/A;  . [place], 2000, 2001, 2014  . CYSTOSCOPY W/ RETROGRADES Bilateral 12/10/2017   Procedure: CYSTOSCOPY WITH RETROGRADE PYELOGRAM;  Surgeon: [name], MD;  Location: [facility] ORS;  Service: Urology;  Laterality: Bilateral;  . CYSTOSCOPY WITH STENT PLACEMENT Left 12/10/2017   Procedure: CYSTOSCOPY WITH STENT PLACEMENT;  Surgeon: [name], MD;  Location: [facility] ORS;  Service: Urology;  Laterality: Left;  . INGUINAL HERNIA REPAIR Right 08/09/2015   Procedure: HERNIA REPAIR INGUINAL ADULT;  Surgeon: [name], MD;  Location: [facility] ORS;  Service: General;  Laterality: Right;  . NASAL SINUS SURGERY    . nuclear stress test    . URETERAL BIOPSY Left 12/10/2017   Procedure: URETERAL & renal PELVIS BIOPSY;  Surgeon: [name], MD;  Location: [facility]  ORS;  Service: Urology;  Laterality: Left;  . URETEROSCOPY Left 12/10/2017   Procedure: URETEROSCOPY;  Surgeon: [name], MD;  Location: [facility] ORS;  Service: Urology;  Laterality: Left;    Prior to Admission medications   Medication Sig Start Date End Date Taking? Authorizing Provider  amLODipine (NORVASC) 5 MG tablet TAKE 1 TABLET BY MOUTH ONCE DAILY 12/04/17   Jerrol Banana., MD  aspirin 81 MG tablet Take 1 mg by mouth daily.  04/03/13   [provider]  atorvastatin (LIPITOR) 40 MG tablet TAKE ONE TABLET BY MOUTH ONCE DAILY 04/16/17   Jerrol Banana., MD  carvedilol (COREG) 12.5 MG tablet TAKE ONE TABLET BY MOUTH TWICE DAILY 07/12/17   Jerrol Banana., MD  clobetasol cream (TEMOVATE) 3.76 % Apply 1 application topically as needed.  01/10/17   [provider]  docusate sodium (COLACE) 100 MG capsule Take 1 capsule (100 mg total) by mouth 2 (two) times daily. 12/05/17 12/05/18  Loney Hering, MD  empagliflozin (JARDIANCE) 10 MG TABS tablet Take 10 mg by mouth daily. Patient not taking: Reported on 12/07/2017 11/23/17   Virginia Crews, MD  fluticasone [facility]) 50 MCG/ACT nasal spray Place 2 sprays into both nostrils daily. 09/19/17   Jerrol Banana., MD  glucose blood (CONTOUR NEXT TEST) test strip Checks sugar 4 times daily. DX E11.9-strips for Contour next EZ meter 05/21/17   Jerrol Banana., MD  Insulin Pen Needle 31G X 5 MM MISC Needs BD ultra fine mini needles to use twice daily DX E11.9 02/22/17   Jerrol Banana., MD  Ivermectin (SOOLANTRA) 1 % CREA Apply topically daily.    [provider]  losartan (COZAAR) 100 MG tablet TAKE ONE TABLET BY MOUTH ONCE DAILY 05/24/17   Jerrol Banana., MD  metFORMIN (GLUCOPHAGE) 1000 MG tablet Take 1 tablet (1,000 mg total) by mouth 2 (two) times daily with a meal. 06/14/17   Jerrol Banana., MD  NON FORMULARY CPAP machine every night-measurement 10    [provider]  nystatin cream (MYCOSTATIN) APPLY  CREAM TOPICALLY TWICE DAILY AROUND  THE  GROIN  AREA Patient taking differently: APPLY  CREAM TOPICALLY TWICE DAILY AROUND  THE  GROIN  AREA as needed 04/10/16   Jerrol Banana., MD  ondansetron (ZOFRAN ODT) 4 MG disintegrating tablet Take 1 tablet (4 mg total) by mouth every 8 (eight) hours as needed for nausea or vomiting. 12/01/17   Earleen Newport, MD  oxybutynin (DITROPAN) 5 MG tablet Take 1 tablet (5 mg total) by mouth every 8 (eight) hours as needed for bladder spasms. 12/10/17   [name], MD  oxyCODONE (OXY IR/ROXICODONE) 5 MG immediate release tablet Take 1-2 tablets (5-10 mg total) by mouth every 6 (six) hours as needed for severe pain. 12/07/17   Karen Kitchens, NP  oxyCODONE-acetaminophen (PERCOCET)  5-325 MG tablet Take 1-2 tablets by mouth every 4 (four) hours as needed for moderate pain or severe pain. Patient not taking: Reported on 12/13/2017 12/10/17   [name], MD  polyethylene glycol [facility]) packet Take 17 g by mouth 2 (two) times daily. 12/05/17   Loney Hering, MD  tamsulosin (FLOMAX) 0.4 MG CAPS capsule Take 1 capsule (0.4 mg total) by mouth daily. 12/10/17   [name], MD    Allergies Sulfa antibiotics; Ace inhibitors; Invokana [canagliflozin]; and Penicillins  Family History  Problem Relation Age of Onset  . Heart disease Mother   . Cancer Father        Lung and colon cancer  . Heart disease Father   . Emphysema Maternal Grandfather   . Tuberculosis Maternal Grandmother     Social History Social History   Tobacco Use  . Smoking status: Never Smoker  . Smokeless tobacco: Former Systems developer    Types: Chew  Substance Use Topics  . Alcohol use: Yes    Alcohol/week: 0.6 - 3.0 oz    Types: 1 Standard drinks or equivalent per week    Comment: rarely  . Drug use: No    Review of Systems  Constitutional: No fever/chills Eyes: No visual changes. ENT: No sore throat. Cardiovascular: Denies  chest pain. Respiratory: Denies shortness of breath. Gastrointestinal:  abdominal pain, nausea, and constipation no vomiting.  No diarrhea.  No constipation. Genitourinary: Negative for dysuria. Musculoskeletal: Negative for back pain. Skin: Negative for rash. Neurological: Negative for headaches, focal weakness or numbness.   ____________________________________________   PHYSICAL EXAM:  VITAL SIGNS: ED Triage Vitals  Enc Vitals Group     BP 12/17/17 0138 (!) 141/89     Pulse Rate 12/17/17 0138 86     Resp 12/17/17 0138 20     Temp 12/17/17 0138 98.4 F (36.9 C)     Temp Source 12/17/17 0138 Oral     SpO2 12/17/17 0138 98 %     Weight --      Height --      Head Circumference --      Peak Flow --      Pain Score 12/17/17 0139 9     Pain Loc --      Pain Edu? --      Excl. in [place]? --     Constitutional: Alert and oriented. Well appearing and in moderate distress. Eyes: Conjunctivae are normal. PERRL. EOMI. Head: Atraumatic. Nose: No congestion/rhinnorhea. Mouth/Throat: Mucous membranes are moist.  Oropharynx non-erythematous. Cardiovascular: Normal rate, regular rhythm. Grossly normal heart sounds.  Good peripheral circulation. Respiratory: Normal respiratory effort.  No retractions. Lungs CTAB. Gastrointestinal: Soft with some diffuse tenderness to palpation. No distention. Positive bowel sounds Musculoskeletal: No lower extremity tenderness nor edema.   Neurologic:  Normal speech and language.  Skin:  Skin is warm, dry and intact.  Psychiatric: Mood and affect are normal.   ____________________________________________   LABS (all labs ordered are listed, but only abnormal results are displayed)  Labs Reviewed  CBC WITH DIFFERENTIAL/PLATELET - Abnormal; Notable for the following components:      Result Value   RBC 3.95 (*)    Hemoglobin 11.9 (*)    HCT 34.7 (*)    Neutro Abs 8.4 (*)    Lymphs Abs 0.9 (*)    All other components within normal limits    COMPREHENSIVE METABOLIC PANEL - Abnormal; Notable for the following components:   Sodium 128 (*)    Chloride 90 (*)  Glucose, Bld 185 (*)    All other components within normal limits  LIPASE, BLOOD  TROPONIN I   ____________________________________________  EKG  ED ECG REPORT I, Loney Hering, the attending physician, personally viewed and interpreted this ECG.   Date: 12/17/2017  EKG Time: 151  Rate: 82  Rhythm: normal sinus rhythm  Axis: left axis deviation  Intervals:none  ST&T Change: none  ____________________________________________  RADIOLOGY  ED MD interpretation:  none  Official radiology report(s): No results found.  ____________________________________________   PROCEDURES  Procedure(s) performed: None  Procedures  Critical Care performed: No  ____________________________________________   INITIAL IMPRESSION / ASSESSMENT AND PLAN / ED COURSE  As part of my medical decision making, I reviewed the following data within the electronic MEDICAL RECORD NUMBER Notes from prior ED visits and [place] Controlled Substance Database   This is a 72 year old male who comes into the hospital today with some abdominal pain.  The patient has been having some abdominal pain ever since before his diagnosis of cancer.  His wife reports that he is unable to eat and his pain is not being well managed with his pain medicines at home.  We did check some blood work on the patient to include a CBC, CMP, lipase and a troponin.  The patient's sodium is low at 128 and his chloride is 90.  Otherwise the patient's blood work is unremarkable.  He has had recent CT scans including CTs of his chest and abdomen within the last few weeks which is when he had his diagnosis of cancer.  I did give the patient a liter of normal saline as well as a dose of Dilaudid.  He reports that the pain barely helped so I gave him a second dose of Dilaudid.  As this pain is due to the patient's cancer I  do not want to perform any other imaging studies as it is just been pain getting worse and worse.  Given the patient's consistent pain as well as his hyponatremia I will admitted to the hospitalist service for some fluids as well as pain medicine.  He will be admitted.  I discussed this with the hospitalist.      ____________________________________________   FINAL CLINICAL IMPRESSION(S) / ED DIAGNOSES  Final diagnoses:  Generalized abdominal pain  Hyponatremia     ED Discharge Orders    None       Note:  This document was prepared using Dragon voice recognition software and may include unintentional dictation errors.    Loney Hering, MD 12/17/17 8177    Loney Hering, MD 12/17/17 0730

## 2017-12-18 ENCOUNTER — Inpatient Hospital Stay: Payer: Medicare Other

## 2017-12-18 ENCOUNTER — Telehealth: Payer: Self-pay | Admitting: *Deleted

## 2017-12-18 ENCOUNTER — Other Ambulatory Visit: Payer: Self-pay | Admitting: *Deleted

## 2017-12-18 DIAGNOSIS — R109 Unspecified abdominal pain: Secondary | ICD-10-CM | POA: Diagnosis not present

## 2017-12-18 DIAGNOSIS — C642 Malignant neoplasm of left kidney, except renal pelvis: Secondary | ICD-10-CM

## 2017-12-18 DIAGNOSIS — T402X5A Adverse effect of other opioids, initial encounter: Secondary | ICD-10-CM

## 2017-12-18 DIAGNOSIS — C649 Malignant neoplasm of unspecified kidney, except renal pelvis: Secondary | ICD-10-CM | POA: Diagnosis not present

## 2017-12-18 DIAGNOSIS — Z880 Allergy status to penicillin: Secondary | ICD-10-CM | POA: Diagnosis not present

## 2017-12-18 DIAGNOSIS — Z888 Allergy status to other drugs, medicaments and biological substances status: Secondary | ICD-10-CM | POA: Diagnosis not present

## 2017-12-18 DIAGNOSIS — C689 Malignant neoplasm of urinary organ, unspecified: Secondary | ICD-10-CM

## 2017-12-18 DIAGNOSIS — E860 Dehydration: Secondary | ICD-10-CM | POA: Diagnosis not present

## 2017-12-18 DIAGNOSIS — Z9842 Cataract extraction status, left eye: Secondary | ICD-10-CM | POA: Diagnosis not present

## 2017-12-18 DIAGNOSIS — G893 Neoplasm related pain (acute) (chronic): Secondary | ICD-10-CM | POA: Diagnosis present

## 2017-12-18 DIAGNOSIS — E871 Hypo-osmolality and hyponatremia: Secondary | ICD-10-CM | POA: Diagnosis not present

## 2017-12-18 DIAGNOSIS — E440 Moderate protein-calorie malnutrition: Secondary | ICD-10-CM

## 2017-12-18 DIAGNOSIS — E119 Type 2 diabetes mellitus without complications: Secondary | ICD-10-CM | POA: Diagnosis present

## 2017-12-18 DIAGNOSIS — Z515 Encounter for palliative care: Secondary | ICD-10-CM | POA: Diagnosis not present

## 2017-12-18 DIAGNOSIS — R1011 Right upper quadrant pain: Secondary | ICD-10-CM

## 2017-12-18 DIAGNOSIS — Z800 Family history of malignant neoplasm of digestive organs: Secondary | ICD-10-CM | POA: Diagnosis not present

## 2017-12-18 DIAGNOSIS — R319 Hematuria, unspecified: Secondary | ICD-10-CM | POA: Diagnosis present

## 2017-12-18 DIAGNOSIS — G4733 Obstructive sleep apnea (adult) (pediatric): Secondary | ICD-10-CM | POA: Diagnosis present

## 2017-12-18 DIAGNOSIS — Z8249 Family history of ischemic heart disease and other diseases of the circulatory system: Secondary | ICD-10-CM | POA: Diagnosis not present

## 2017-12-18 DIAGNOSIS — I252 Old myocardial infarction: Secondary | ICD-10-CM | POA: Diagnosis not present

## 2017-12-18 DIAGNOSIS — Z7982 Long term (current) use of aspirin: Secondary | ICD-10-CM | POA: Diagnosis not present

## 2017-12-18 DIAGNOSIS — Z6824 Body mass index (BMI) 24.0-24.9, adult: Secondary | ICD-10-CM | POA: Diagnosis not present

## 2017-12-18 DIAGNOSIS — I10 Essential (primary) hypertension: Secondary | ICD-10-CM | POA: Diagnosis present

## 2017-12-18 DIAGNOSIS — Z882 Allergy status to sulfonamides status: Secondary | ICD-10-CM | POA: Diagnosis not present

## 2017-12-18 DIAGNOSIS — Z955 Presence of coronary angioplasty implant and graft: Secondary | ICD-10-CM | POA: Diagnosis not present

## 2017-12-18 DIAGNOSIS — Z794 Long term (current) use of insulin: Secondary | ICD-10-CM | POA: Diagnosis not present

## 2017-12-18 DIAGNOSIS — E785 Hyperlipidemia, unspecified: Secondary | ICD-10-CM | POA: Diagnosis present

## 2017-12-18 DIAGNOSIS — C679 Malignant neoplasm of bladder, unspecified: Secondary | ICD-10-CM | POA: Diagnosis not present

## 2017-12-18 DIAGNOSIS — R140 Abdominal distension (gaseous): Secondary | ICD-10-CM | POA: Diagnosis present

## 2017-12-18 DIAGNOSIS — F329 Major depressive disorder, single episode, unspecified: Secondary | ICD-10-CM | POA: Diagnosis present

## 2017-12-18 DIAGNOSIS — K5903 Drug induced constipation: Secondary | ICD-10-CM | POA: Diagnosis not present

## 2017-12-18 DIAGNOSIS — R1084 Generalized abdominal pain: Secondary | ICD-10-CM | POA: Diagnosis present

## 2017-12-18 LAB — BASIC METABOLIC PANEL
ANION GAP: 7 (ref 5–15)
BUN: 9 mg/dL (ref 6–20)
CHLORIDE: 96 mmol/L — AB (ref 101–111)
CO2: 28 mmol/L (ref 22–32)
Calcium: 9.5 mg/dL (ref 8.9–10.3)
Creatinine, Ser: 0.68 mg/dL (ref 0.61–1.24)
GFR calc Af Amer: >60 mL/min (ref >60)
GLUCOSE: 188 mg/dL — AB (ref 65–99)
Potassium: 4 mmol/L (ref 3.5–5.1)
SODIUM: 131 mmol/L — AB (ref 135–145)

## 2017-12-18 LAB — HEMOGLOBIN AND HEMATOCRIT, BLOOD
HCT: 31.2 % — ABNORMAL LOW (ref 40.0–52.0)
Hemoglobin: 10.7 g/dL — ABNORMAL LOW (ref 13.0–18.0)

## 2017-12-18 LAB — GLUCOSE, CAPILLARY
GLUCOSE-CAPILLARY: 164 mg/dL — AB (ref 65–99)
GLUCOSE-CAPILLARY: 94 mg/dL (ref 65–99)
Glucose-Capillary: 167 mg/dL — ABNORMAL HIGH (ref 65–99)
Glucose-Capillary: 166 mg/dL — ABNORMAL HIGH (ref 65–99)

## 2017-12-18 MED ORDER — SENNA 8.6 MG PO TABS
1.0000 | ORAL_TABLET | Freq: Two times a day (BID) | ORAL | Status: DC
  Administered 2017-12-18 (×2): 8.6 mg via ORAL
  Filled 2017-12-18 (×2): qty 1

## 2017-12-18 MED ORDER — BISACODYL 10 MG RE SUPP
10.0000 mg | Freq: Every day | RECTAL | Status: DC | PRN
  Administered 2017-12-18: 10 mg via RECTAL
  Filled 2017-12-18: qty 1

## 2017-12-18 MED ORDER — OXYCODONE HCL ER 15 MG PO T12A
15.0000 mg | EXTENDED_RELEASE_TABLET | Freq: Two times a day (BID) | ORAL | Status: DC
  Administered 2017-12-18 – 2017-12-19 (×3): 15 mg via ORAL
  Filled 2017-12-18 (×3): qty 1

## 2017-12-18 MED ORDER — LINACLOTIDE 290 MCG PO CAPS
290.0000 ug | ORAL_CAPSULE | Freq: Every day | ORAL | Status: DC
  Administered 2017-12-19: 12:00:00 290 ug via ORAL
  Filled 2017-12-18: qty 1

## 2017-12-18 MED ORDER — ENSURE ENLIVE PO LIQD: 237.0000 mL | Freq: Three times a day (TID) | ORAL | Status: DC

## 2017-12-18 MED ORDER — EPINEPHRINE PF 1 MG/ML IJ SOLN
INTRAMUSCULAR | Status: AC
  Filled 2017-12-18: qty 2

## 2017-12-18 MED ORDER — FLEET ENEMA 7-19 GM/118ML RE ENEM: 1.0000 | ENEMA | Freq: Every day | RECTAL | Status: DC | PRN

## 2017-12-18 NOTE — Care Management Obs Status (Signed)
[place] NOTIFICATION   Patient Details  Name: CLAYTEN ALLCOCK MRN: [id number] Date of Birth: [birth year]   Medicare Observation Status Notification Given:  Yes    Shelbie Ammons, RN 12/18/2017, 9:40 AM

## 2017-12-18 NOTE — Progress Notes (Signed)
[place] [facility] [place] NAME: Patrick Mcgee    MR#:  [id number]  DATE OF BIRTH:  Feb 04, 1946  SUBJECTIVE:  CHIEF COMPLAINT:   Chief Complaint  Patient presents with  . Pain Control  Patient seen and evaluated today Pain little better with iv morphine Has constipation Decreased apetite  REVIEW OF SYSTEMS:    ROS  CONSTITUTIONAL: No documented fever. No fatigue, weakness. No weight gain, no weight loss.  EYES: No blurry or double vision.  ENT: No tinnitus. No postnasal drip. No redness of the oropharynx.  RESPIRATORY: No cough, no wheeze, no hemoptysis. No dyspnea.  CARDIOVASCULAR: No chest pain. No orthopnea. No palpitations. No syncope.  GASTROINTESTINAL: No nausea, no vomiting or diarrhea. Has abdominal pain. No melena or hematochezia.  GENITOURINARY: No dysuria or hematuria.  ENDOCRINE: No polyuria or nocturia. No heat or cold intolerance.  HEMATOLOGY: No anemia. No bruising. No bleeding.  INTEGUMENTARY: No rashes. No lesions.  MUSCULOSKELETAL: No arthritis. No swelling. No gout.  NEUROLOGIC: No numbness, tingling, or ataxia. No seizure-type activity.  PSYCHIATRIC: No anxiety. No insomnia. No ADD.   DRUG ALLERGIES:   Allergies  Allergen Reactions  . Sulfa Antibiotics Other (See Comments)    Joint pain  . Ace Inhibitors Cough  . Invokana [Canagliflozin] Other (See Comments)    Leg pain  . Penicillins Rash    Has patient had a PCN reaction causing immediate rash, facial/tongue/throat swelling, SOB or lightheadedness with hypotension: Yes Has patient had a PCN reaction causing severe rash involving mucus membranes or skin necrosis: No Has patient had a PCN reaction that required hospitalization: No Has patient had a PCN reaction occurring within the last 10 years: No If all of the above answers are "NO", then may proceed with Cephalosporin use.    VITALS:  Blood pressure 125/83, pulse 72, temperature 97.7 F (36.5 C),  temperature source Oral, resp. rate 16, height 5\' 8"  (1.727 m), weight 73.2 kg (161 lb 4.8 oz), SpO2 98 %.  PHYSICAL EXAMINATION:   Physical Exam  GENERAL:  72 y.o.-year-old patient lying in the bed with no acute distress.  EYES: Pupils equal, round, reactive to light and accommodation. No scleral icterus. Extraocular muscles intact.  HEENT: Head atraumatic, normocephalic. Oropharynx and nasopharynx clear.  NECK:  Supple, no jugular venous distention. No thyroid enlargement, no tenderness.  LUNGS: Normal breath sounds bilaterally, no wheezing, rales, rhonchi. No use of accessory muscles of respiration.  CARDIOVASCULAR: S1, S2 normal. No murmurs, rubs, or gallops.  ABDOMEN: Soft, mild tenderness below umbilicus, nondistended. Bowel sounds present. No organomegaly or mass.  EXTREMITIES: No cyanosis, clubbing or edema b/l.    NEUROLOGIC: Cranial nerves II through XII are intact. No focal Motor or sensory deficits b/l.   PSYCHIATRIC: The patient is alert and oriented x 3.  SKIN: No obvious rash, lesion, or ulcer.   LABORATORY PANEL:   CBC Recent Labs  Lab 12/17/17 0148  WBC 10.6  HGB 11.9*  HCT 34.7*  PLT 308   ------------------------------------------------------------------------------------------------------------------ Chemistries  Recent Labs  Lab 12/17/17 0148 12/18/17 0410  NA 128* 131*  K 4.1 4.0  CL 90* 96*  CO2 27 28  GLUCOSE 185* 188*  BUN 15 9  CREATININE 0.70 0.68  CALCIUM 9.7 9.5  AST 34  --   ALT 34  --   ALKPHOS 86  --   BILITOT 1.2  --    ------------------------------------------------------------------------------------------------------------------  Cardiac Enzymes Recent Labs  Lab 12/17/17 0148  TROPONINI <  0.03   ------------------------------------------------------------------------------------------------------------------  RADIOLOGY:  No results found.   ASSESSMENT AND PLAN:  72 year old male patient with history of for diabetes  mellitus type 2, sleep apnea, hypertension, urothelial cancer of left kidney, hyperlipidemia presented to the emergency room with abdominal pain and weakness  - Abdominal pain secondary to cancer Start on oral oxycodone long acting with oral roxicet for break thru pain Palliative care consult Continue prn morphine iv for better control of pain  - Urothelial cancer Vascular surgery consult for porta cath placement  -Hyponatremia Improving with iv fluids  -Constipation Start oral laxatives with prn enema  - DVT prophylaxis with [place] lovenox daily   All the records are reviewed and case discussed with Care Management/Social Worker. Management plans discussed with the patient, family and they are in agreement.  CODE STATUS: Full code  DVT Prophylaxis: SCDs  TOTAL TIME TAKING CARE OF THIS PATIENT: 34 minutes.   POSSIBLE D/C IN 1 to 2 DAYS, DEPENDING ON CLINICAL CONDITION.  Saundra Shelling M.D on 12/18/2017 at 10:26 AM  Between 7am to 6pm - Pager - [phone]  After 6pm go to [email or web address] - password [email or web address] [place] [facility]  Office  [phone]  CC: Primary care physician; Jerrol Banana., MD  Note: This dictation was prepared with Dragon dictation along with smaller phrase technology. Any transcriptional errors that result from this process are unintentional.

## 2017-12-18 NOTE — Telephone Encounter (Signed)
Palliative care NP came in and told then that Lauren said for patient to see her Thursday, she is concerned because he already has 2 other appts that day and would like to speak with Lauren about it. She is in his hospital room 119 Please return call

## 2017-12-18 NOTE — Care Management Note (Signed)
Case Management Note  Patient Details  Name: EVERETTE MALL MRN: [id number] Date of Birth: 12-06-1945  Subjective/Objective:                  Admitted to [facility] under observation status with the diagnosis of dehydration. Lives with wife, [name], [phone]). Sees Dr. Rosanna Randy every 3 months. Prescriptions are filled at [facility] on [address]. No home Health. No skilled facility. No home oxygen. Takes care of all basic activities of daily living himself, doesn't drive  Due to taking narcotics for Cancer. Good - fair  Appetite. Family will transport. Port is scheduled to be placed 12/19/17  Action/Plan: Will continue to follow for discharge plans Possible palliative care in the home.  Expected Discharge Date:  12/19/17               Expected Discharge Plan:     In-House Referral:     Discharge planning Services     Post Acute Care Choice:    Choice offered to:     DME Arranged:    DME Agency:     HH Arranged:    HH Agency:     Status of Service:     If discussed at [employer], dates discussed:    Additional Comments:  Shelbie Ammons, RN MSN CCM Care Management [phone] 12/18/2017, 9:31 AM

## 2017-12-18 NOTE — Consult Note (Signed)
Consultation Note Date: 12/18/2017   Patient Name: Patrick Mcgee  DOB: 1946-01-28  MRN: [id number]  Age / Sex: 72 y.o., male  PCP: Jerrol Banana., MD Referring Physician: Saundra Shelling, MD  Reason for Consultation: Establishing goals of care, Non pain symptom management, Pain control and Psychosocial/spiritual support  HPI/Patient Profile: 72 y.o. male  with past medical history of MI, DM, OSA, anxiety, and newly diagnosed left urothelial carcinoma who was admitted on 12/17/2017 with severe right sided abdominal pain.  Of note the patient has had 3 ER visits for severe pain since 5/25.  Clinical Assessment and Goals of Care:  I have reviewed medical records including EPIC notes, labs and imaging, received report from the bedside RN, assessed the patient and then met at the bedside along with his wife to discuss diagnosis prognosis, GOC, EOL wishes, disposition and options.  I introduced Palliative Medicine as specialized medical care for people living with serious illness. It focuses on providing relief from the symptoms and stress of a serious illness. The goal is to improve quality of life for both the patient and the family.  We discussed a brief life review of the patient. 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  He lives at home with his wife.  Both are retired.  He was dieting to try to lose weight a couple of years ago and successfully lost 90 lbs.  Unfortunately he kept losing weight.    Recently his wife has noticed him slowing down - he no longer goes out to [employer] are [employer] like he used to enjoy doing.  But he is able to walk around the house.  He now eats very little and continuously complains of feeling bloated.  He is normally coherent but has had moments of confusion due to medications and severe pain.  His wife is quite concerned about his extreme episodes of pain.  He  will awake from sleep in the middle of the night with severe right sided pain that extends from under his arm all the way down his side.  It is unrelenting.  He is unable to sit still and will walk or be off the bed on all fours.  It is not eased until they receive treatment in the ER, but ER waits are so long - sometimes 10 - 12 hours before he receives treatment.  Mrs. Rydberg states she can not watch him go thru this any longer.  Mr.  Alspaugh slept thru out my visit.  Mrs. Easterly wanted to meet again tomorrow with her daughter present.  We scheduled a meeting for tomorrow at 1:00 pm    Primary Decision Maker:  NEXT OF KIN    SUMMARY OF RECOMMENDATIONS     Follow up meeting with family for [place] on 6/12 at 1:00 pm   I spoke with Beckey Rutter, NP of the [facility].  An appt has been arranged for Mr. Betzer in the Clinic - Thursday 6/13 at 1:15 pm.   Palliative Care  outpatient to follow.     Code Status/Advance Care Planning:  Full code - will discuss on 6/12   Symptom Management:   Agree with new combination of long and short acting opioid pain relief  Abdominal xray ordered   Will add linzess as part of a daily bowel regimen.  Additional Recommendations (Limitations, Scope, Preferences):  Full Scope Treatment  Palliative Prophylaxis:   Frequent Pain Assessment  Psycho-social/Spiritual:   Desire for further Chaplaincy support: not yet discussed.  Prognosis:  Unable to determine.  Patient with metastatic cancer about to start chemo therapy.   Discharge Planning: Home with Palliative Services      Primary Diagnoses: Present on Admission: . Dehydration . Urothelial cancer ([place])   I have reviewed the medical record, interviewed the patient and family, and examined the patient. The following aspects are pertinent.  Past Medical History:  Diagnosis Date  . Acid reflux   . Anxiety   . Cancer ([place])    renal mass  . Depression   . Diabetes mellitus  without complication ([place])   . Hyperlipidemia   . Hypertension   . Myocardial infarction ([place]) 1995  . Sleep apnea    Social History   Socioeconomic History  . Marital status: Married    Spouse name: Not on file  . Number of children: Not on file  . Years of education: Not on file  . Highest education level: Not on file  Occupational History  . Occupation: retired  Scientific laboratory technician  . Financial resource strain: Not on file  . Food insecurity:    Worry: Not on file    Inability: Not on file  . Transportation needs:    Medical: Not on file    Non-medical: Not on file  Tobacco Use  . Smoking status: Never Smoker  . Smokeless tobacco: Former Systems developer    Types: Chew  Substance and Sexual Activity  . Alcohol use: Yes    Alcohol/week: 0.6 - 3.0 oz    Types: 1 Standard drinks or equivalent per week    Comment: rarely  . Drug use: No  . Sexual activity: Not Currently  Lifestyle  . Physical activity:    Days per week: Not on file    Minutes per session: Not on file  . Stress: Not on file  Relationships  . Social connections:    Talks on phone: Not on file    Gets together: Not on file    Attends religious service: Not on file    Active member of club or organization: Not on file    Attends meetings of clubs or organizations: Not on file    Relationship status: Not on file  Other Topics Concern  . Not on file  Social History Narrative  . Not on file   Family History  Problem Relation Age of Onset  . Heart disease Mother   . Cancer Father        Lung and colon cancer  . Heart disease Father   . Emphysema Maternal Grandfather   . Tuberculosis Maternal Grandmother    Scheduled Meds: . atorvastatin  40 mg Oral Daily  . calcium carbonate  1 tablet Oral BID  . carvedilol  12.5 mg Oral BID  . clobetasol cream  1 application Topical BID  . enoxaparin (LOVENOX) injection  40 mg Subcutaneous Q24H  . fluticasone  2 spray Each Nare Daily  . insulin aspart  0-15 Units  Subcutaneous TID WC  . insulin aspart  0-5  Units Subcutaneous QHS  . [START ON 12/19/2017] linaclotide  290 mcg Oral QAC breakfast  . losartan  100 mg Oral Daily  . metFORMIN  1,000 mg Oral BID WC  . oxyCODONE  15 mg Oral Q12H  . pantoprazole  40 mg Oral Daily  . polyethylene glycol  17 g Oral BID  . senna  1 tablet Oral BID  . tamsulosin  0.4 mg Oral Daily   Continuous Infusions: . sodium chloride 100 mL/hr at 12/18/17 0430   PRN Meds:.acetaminophen **OR** acetaminophen, ALPRAZolam, bisacodyl, metoCLOPramide, morphine injection, oxybutynin, oxyCODONE, sodium phosphate Allergies  Allergen Reactions  . Sulfa Antibiotics Other (See Comments)    Joint pain  . Ace Inhibitors Cough  . Invokana [Canagliflozin] Other (See Comments)    Leg pain  . Penicillins Rash    Has patient had a PCN reaction causing immediate rash, facial/tongue/throat swelling, SOB or lightheadedness with hypotension: Yes Has patient had a PCN reaction causing severe rash involving mucus membranes or skin necrosis: No Has patient had a PCN reaction that required hospitalization: No Has patient had a PCN reaction occurring within the last 10 years: No If all of the above answers are "NO", then may proceed with Cephalosporin use.   Review of Systems Patient sleeping  Physical Exam  Thin well developed man, pale in color, sleeping, does not wake to exam CV rrr systolic murmur Lungs no respiratory distress, snoring, no w/c/r Abdomen soft, non-tender to light palpation   Vital Signs: BP 125/83 (BP Location: Right Arm)   Pulse 72   Temp 97.7 F (36.5 C) (Oral)   Resp 16   Ht _0  (1.727 m)   Wt 73.2 kg (161 lb 4.8 oz)   SpO2 98%   BMI 24.53 kg/m  Pain Scale: 0-10   Pain Score: Asleep   SpO2: SpO2: 98 % O2 Device:SpO2: 98 % O2 Flow Rate: .   IO: Intake/output summary:   Intake/Output Summary (Last 24 hours) at 12/18/2017 1338 Last data filed at 12/18/2017 0200 Gross per 24 hour  Intake 1920 ml    Output -  Net 1920 ml    LBM: Last BM Date: 12/14/17 Baseline Weight: Weight: 73.2 kg (161 lb 4.8 oz) Most recent weight: Weight: 73.2 kg (161 lb 4.8 oz)     Palliative Assessment/Data:60%     Time In: 12:30 Time Out: 1:00 Time Total: 90 min. Greater than 50%  of this time was spent counseling and coordinating care related to the above assessment and plan.  Signed by: Florentina Jenny, PA-C Palliative Medicine Pager: [phone]  Please contact Palliative Medicine Team phone at [phone] for questions and concerns.  For individual provider: See Shea Evans

## 2017-12-18 NOTE — Telephone Encounter (Signed)
I spoke with wife and discussed appts for this week. He does not have lab appointment Thursday, it is [place]. I explained about the education and chemotherapy care clinic appts. Her concern is that she does not know if he can tolerate being up and out for so long and I told her that he can rest and she can go to the chemotherapy edu class for him if needed.

## 2017-12-18 NOTE — Progress Notes (Signed)
Initial Nutrition Assessment  DOCUMENTATION CODES:   Non-severe (moderate) malnutrition in context of chronic illness  INTERVENTION:  Recommend liberalizing diet to just carbohydrate modified. If intake remains poor, could also liberalize further to regular diet.  Provide Ensure Enlive po TID, each supplement provides 350 kcal and 20 grams of protein.  Encouraged adequate intake of calories and protein from meals, beverages, and ONS. Discussed choosing small, frequent meals in setting of discomfort and early satiety.  NUTRITION DIAGNOSIS:   Moderate Malnutrition related to chronic illness(metastatic urothelial carcinoma) as evidenced by moderate fat depletion, mild muscle depletion, moderate muscle depletion, 13.6 percent weight loss over 2.5 months.  GOAL:   Patient will meet greater than or equal to 90% of their needs  MONITOR:   PO intake, Supplement acceptance, Labs, Weight trends, I & O's  REASON FOR ASSESSMENT:   Malnutrition Screening Tool    ASSESSMENT:   72 year old male with PMHx of HTN, HLD, DM type 2, CAD, Hx MI in 1995, anxiety, acid reflux, depression, sleep apnea, metastatic urothelial carcinoma with plan to initiate chemotherapy on 6/14 who is now admitted with abdominal pain secondary to cancer, constipation, hyponatremia, and weakness.   -Per abdominal x-ray today there is moderate amount of stool in the colon. No bowel obstruction.  Met with patient and his wife at bedside. Most of history provided by wife as patient was very tired and wanted to rest. Wife reports he has had a decreased appetite for a while now, though it has improved slightly today. At home he has been mainly trying to eat soft foods such as Ensure, yogurt, or ice cream. He experiences a pain on right side of abdomen, which wife reports is possibly related to his constipation. He also feels very bloated/distended with eating and burps frequently. Wife reports that today for lunch he has been  attempting some more solid-type foods. He ordered roast beef, mashed potatoes, yogurt.  Wife reports his UBW had initially been 255 lbs several years ago. He then lost weight intentionally through [employer] and increased exercised. Wife reports that he had stabilized at around 170 lbs. Recently he has had unintentional weight loss and wife believes he actually weights even less than 157 lbs now. Patient was 170.4 lbs on 09/19/2017 and 147.2 lbs on 12/07/2017. That is a weight loss of 23.2 lbs (13.6% body weight) over 2.5 months, which is significant for time frame.  Medications reviewed and include: calcium carbonate 1 tablet BID, Novolog 0-15 units TID, Novolog 0-5 units QHS, Linzess, metformin 100 mg BID with meals, oxycodone, pantoprazole, Miralax, senna, Flomax, NS @ 100 mL/hr.  Labs reviewed: CBG 123-167, Sodium 131, Chloride 96.  NUTRITION - FOCUSED PHYSICAL EXAM:    Most Recent Value  Orbital Region  Moderate depletion  Upper Arm Region  Moderate depletion  Thoracic and Lumbar Region  Mild depletion  Buccal Region  Moderate depletion  Temple Region  Moderate depletion  Clavicle Bone Region  Mild depletion  Clavicle and Acromion Bone Region  Mild depletion  Scapular Bone Region  Unable to assess  Dorsal Hand  Moderate depletion  Patellar Region  Moderate depletion  Anterior Thigh Region  Moderate depletion  Posterior Calf Region  Moderate depletion  Edema (RD Assessment)  None  Hair  Reviewed  Eyes  Reviewed  Mouth  Reviewed  Skin  Reviewed  Nails  Reviewed     Diet Order:   Diet Order           Diet heart healthy/carb  modified Room service appropriate? Yes; Fluid consistency: Thin  Diet effective now          EDUCATION NEEDS:   Education needs have been addressed  Skin:  Skin Assessment: Reviewed RN Assessment  Last BM:  12/18/2017 per chart  Height:   Ht Readings from Last 1 Encounters:  12/17/17 '5\' 8"'$  (1.727 m)    Weight:   Wt Readings from Last 1  Encounters:  12/17/17 161 lb 4.8 oz (73.2 kg)    Ideal Body Weight:  70 kg  BMI:  Body mass index is 24.53 kg/m.  Estimated Nutritional Needs:   Kcal:  1900-2185 (MSJ x 1.3-1.5)  Protein:  90-110 grams (1.2-1.5 grams/kg)  Fluid:  1.8-2.2 L/day (25-30 mL/kg)  Willey Blade, MS, RD, LDN Office: [phone] Pager: [phone] After Hours/Weekend Pager: [phone]

## 2017-12-19 ENCOUNTER — Ambulatory Visit: Admission: RE | Admit: 2017-12-19 | Payer: Medicare Other | Source: Ambulatory Visit | Admitting: Vascular Surgery

## 2017-12-19 ENCOUNTER — Encounter
Admission: EM | Disposition: A | Discharge status: Home / Self Care | Payer: Self-pay | Source: Home / Self Care | Attending: Internal Medicine

## 2017-12-19 ENCOUNTER — Telehealth: Payer: Self-pay | Admitting: Radiology

## 2017-12-19 ENCOUNTER — Telehealth: Payer: Self-pay | Admitting: *Deleted

## 2017-12-19 ENCOUNTER — Encounter: Payer: Self-pay | Admitting: Vascular Surgery

## 2017-12-19 DIAGNOSIS — C649 Malignant neoplasm of unspecified kidney, except renal pelvis: Secondary | ICD-10-CM

## 2017-12-19 DIAGNOSIS — C679 Malignant neoplasm of bladder, unspecified: Secondary | ICD-10-CM | POA: Diagnosis not present

## 2017-12-19 DIAGNOSIS — E860 Dehydration: Principal | ICD-10-CM

## 2017-12-19 HISTORY — PX: PORTA CATH INSERTION: CATH118285

## 2017-12-19 LAB — GLUCOSE, CAPILLARY
Glucose-Capillary: 115 mg/dL — ABNORMAL HIGH (ref 65–99)
Glucose-Capillary: 147 mg/dL — ABNORMAL HIGH (ref 65–99)

## 2017-12-19 LAB — HEMOGLOBIN AND HEMATOCRIT, BLOOD
HCT: 30.3 % — ABNORMAL LOW (ref 40.0–52.0)
HEMATOCRIT: 31.8 % — AB (ref 40.0–52.0)
HEMOGLOBIN: 10.8 g/dL — AB (ref 13.0–18.0)
HEMOGLOBIN: 10.5 g/dL — AB (ref 13.0–18.0)

## 2017-12-19 LAB — BASIC METABOLIC PANEL
ANION GAP: 9 (ref 5–15)
BUN: 8 mg/dL (ref 6–20)
CALCIUM: 9.1 mg/dL (ref 8.9–10.3)
CO2: 25 mmol/L (ref 22–32)
Chloride: 96 mmol/L — ABNORMAL LOW (ref 101–111)
Creatinine, Ser: 0.69 mg/dL (ref 0.61–1.24)
Glucose, Bld: 131 mg/dL — ABNORMAL HIGH (ref 65–99)
Potassium: 4.1 mmol/L (ref 3.5–5.1)
Sodium: 130 mmol/L — ABNORMAL LOW (ref 135–145)

## 2017-12-19 LAB — MAGNESIUM: Magnesium: 1.5 mg/dL — ABNORMAL LOW (ref 1.7–2.4)

## 2017-12-19 SURGERY — PORTA CATH INSERTION
Anesthesia: Moderate Sedation

## 2017-12-19 MED ORDER — MIDAZOLAM HCL 5 MG/5ML IJ SOLN
INTRAMUSCULAR | Status: AC
  Filled 2017-12-19: qty 5

## 2017-12-19 MED ORDER — HYDROMORPHONE HCL 2 MG PO TABS: 2.0000 mg | ORAL_TABLET | ORAL | 0 refills | Status: DC | PRN

## 2017-12-19 MED ORDER — VANCOMYCIN HCL IN DEXTROSE 1-5 GM/200ML-% IV SOLN
1000.0000 mg | INTRAVENOUS | Status: AC
  Administered 2017-12-19: 09:00:00 1000 mg via INTRAVENOUS
  Filled 2017-12-19: qty 200

## 2017-12-19 MED ORDER — SODIUM CHLORIDE 0.9 % IV SOLN: INTRAVENOUS | Status: DC

## 2017-12-19 MED ORDER — LINACLOTIDE 290 MCG PO CAPS: 290.0000 ug | ORAL_CAPSULE | Freq: Every day | ORAL | 0 refills | Status: DC

## 2017-12-19 MED ORDER — LIDOCAINE-EPINEPHRINE (PF) 1 %-1:200000 IJ SOLN
INTRAMUSCULAR | Status: AC
  Filled 2017-12-19: qty 30

## 2017-12-19 MED ORDER — MIDAZOLAM HCL 2 MG/2ML IJ SOLN
INTRAMUSCULAR | Status: DC | PRN
  Administered 2017-12-19: 2 mg via INTRAVENOUS

## 2017-12-19 MED ORDER — OXYCODONE HCL ER 15 MG PO T12A: 15.0000 mg | EXTENDED_RELEASE_TABLET | Freq: Two times a day (BID) | ORAL | 0 refills | Status: DC

## 2017-12-19 MED ORDER — ENSURE ENLIVE PO LIQD: 237.0000 mL | Freq: Three times a day (TID) | ORAL | 0 refills | Status: DC

## 2017-12-19 MED ORDER — ALPRAZOLAM 0.25 MG PO TABS: 0.2500 mg | ORAL_TABLET | Freq: Three times a day (TID) | ORAL | 0 refills | Status: DC | PRN

## 2017-12-19 MED ORDER — FENTANYL CITRATE (PF) 100 MCG/2ML IJ SOLN
INTRAMUSCULAR | Status: AC
  Filled 2017-12-19: qty 2

## 2017-12-19 MED ORDER — HEPARIN (PORCINE) IN NACL 1000-0.9 UT/500ML-% IV SOLN
INTRAVENOUS | Status: AC
  Filled 2017-12-19: qty 500

## 2017-12-19 MED ORDER — FENTANYL CITRATE (PF) 100 MCG/2ML IJ SOLN
INTRAMUSCULAR | Status: DC | PRN
  Administered 2017-12-19: 50 ug via INTRAVENOUS

## 2017-12-19 MED ORDER — SODIUM CHLORIDE 1 G PO TABS
1.0000 g | ORAL_TABLET | Freq: Three times a day (TID) | ORAL | 0 refills | Status: DC
Start: 2017-12-19 — End: 2018-03-25

## 2017-12-19 MED ORDER — HYDROMORPHONE HCL 2 MG PO TABS: 2.0000 mg | ORAL_TABLET | Freq: Four times a day (QID) | ORAL | 0 refills | Status: DC | PRN

## 2017-12-19 MED ORDER — SODIUM CHLORIDE 1 G PO TABS
1.0000 g | ORAL_TABLET | Freq: Three times a day (TID) | ORAL | 0 refills | Status: DC
Start: 2017-12-19 — End: 2017-12-19

## 2017-12-19 MED ORDER — SODIUM CHLORIDE 1 G PO TABS
1.0000 g | ORAL_TABLET | Freq: Three times a day (TID) | ORAL | Status: DC
  Administered 2017-12-19: 12:00:00 1 g via ORAL
  Filled 2017-12-19 (×3): qty 1

## 2017-12-19 MED ORDER — SODIUM CHLORIDE 0.9 % IV SOLN
Freq: Once | INTRAVENOUS | Status: AC
  Administered 2017-12-19: 09:00:00
  Filled 2017-12-19: qty 80

## 2017-12-19 MED ORDER — LACTULOSE 20 G PO PACK: 20.0000 g | PACK | Freq: Two times a day (BID) | ORAL | 0 refills | Status: DC | PRN

## 2017-12-19 SURGICAL SUPPLY — 9 items
KIT PORT POWER 8FR ISP CVUE (Port) ×3 IMPLANT
PACK ANGIOGRAPHY (CUSTOM PROCEDURE TRAY) ×3 IMPLANT
PAD GROUND ADULT SPLIT (MISCELLANEOUS) ×3 IMPLANT
PENCIL ELECTRO HAND CTR (MISCELLANEOUS) ×3 IMPLANT
SPONGE XRAY 4X4 16PLY STRL (MISCELLANEOUS) ×3 IMPLANT
SUT MNCRL AB 4-0 PS2 18 (SUTURE) ×3 IMPLANT
SUT PROLENE 0 CT 1 30 (SUTURE) ×3 IMPLANT
SUT VIC AB 3-0 SH 27 (SUTURE) ×2
SUT VIC AB 3-0 SH 27X BRD (SUTURE) ×1 IMPLANT

## 2017-12-19 NOTE — Plan of Care (Signed)
Patient is d/ced home following port placement today.  He was admitted for uncontrolled pain.  Has had multiple admissions since Stage 4 L kidney cancer.  During this admission we were able to keep pain under control with long acting ms contin and now has dilaudid PO for breakthrough pain.  He will be followed by palliative outpatient and also has appt w/the pain clinic.  We also worked on symptoms of constipation, GERD, bloating, heartburn and poor appetite.  He had dietary consult and was encouraged to take multiple small meals throughout the day.  His appetite did improve.  Also had BM during admission.  Multiple laxatives on board.  Patient scheduled to start immunotherapy Friday.  IV removed by Nursing student.  Reviewed d/c instructions with wife and patient.  He will transport home with wife.

## 2017-12-19 NOTE — Discharge Summary (Signed)
[facility] [place] NAME: Patrick Mcgee    MR#:  [id number]  DATE OF BIRTH:  01-16-1946  DATE OF ADMISSION:  12/17/2017 ADMITTING PHYSICIAN: Saundra Shelling, MD  DATE OF DISCHARGE: No discharge date for patient encounter.  PRIMARY CARE PHYSICIAN: Jerrol Banana., MD    ADMISSION DIAGNOSIS:  Hyponatremia [E87.1] Generalized abdominal pain [R10.84]  DISCHARGE DIAGNOSIS:  Active Problems:   Dehydration   Urothelial cancer (HCC)   Abdominal pain   Therapeutic opioid induced constipation   Palliative care encounter   Malnutrition of moderate degree   SECONDARY DIAGNOSIS:   Past Medical History:  Diagnosis Date  . Acid reflux   . Anxiety   . Cancer ([place])    renal mass  . Depression   . Diabetes mellitus without complication ([place])   . Hyperlipidemia   . Hypertension   . Myocardial infarction ([place]) 1995  . Sleep apnea     HOSPITAL COURSE:  72 year old male patient with history of for diabetes mellitus type 2, sleep apnea, hypertension, urothelial cancer of left kidney, hyperlipidemia presented to the emergency room with abdominal pain and weakness  - Abdominal pain secondary to cancer Started on long-acting opioid medication, Dilaudid as needed for breakthrough pain, palliative care consulted while in house- will continue to follow status post discharge   - Urothelial cancer Stable status post Port-A-Cath placement by vascular surgery On December 19, 2017, plans for chemotherapy to start on the 14th of this month, outpatient follow-up with oncology planned for 2 days  -Hyponatremia Resolving Treated with IV fluids and salt tablets  -Constipation Placed on appropriate bowel regiment, will continue Senokot at home Linzess, and lactulose as needed  DISCHARGE CONDITIONS:   stable  CONSULTS OBTAINED:  Treatment Team:  [name], MD  DRUG ALLERGIES:   Allergies  Allergen Reactions  . Sulfa Antibiotics  Other (See Comments)    Joint pain  . Ace Inhibitors Cough  . Invokana [Canagliflozin] Other (See Comments)    Leg pain  . Penicillins Rash    Has patient had a PCN reaction causing immediate rash, facial/tongue/throat swelling, SOB or lightheadedness with hypotension: Yes Has patient had a PCN reaction causing severe rash involving mucus membranes or skin necrosis: No Has patient had a PCN reaction that required hospitalization: No Has patient had a PCN reaction occurring within the last 10 years: No If all of the above answers are "NO", then may proceed with Cephalosporin use.    DISCHARGE MEDICATIONS:   Allergies as of 12/19/2017      Reactions   Sulfa Antibiotics Other (See Comments)   Joint pain   Ace Inhibitors Cough   Invokana [canagliflozin] Other (See Comments)   Leg pain   Penicillins Rash   Has patient had a PCN reaction causing immediate rash, facial/tongue/throat swelling, SOB or lightheadedness with hypotension: Yes Has patient had a PCN reaction causing severe rash involving mucus membranes or skin necrosis: No Has patient had a PCN reaction that required hospitalization: No Has patient had a PCN reaction occurring within the last 10 years: No If all of the above answers are "NO", then may proceed with Cephalosporin use.      Medication List    STOP taking these medications   docusate sodium 100 MG capsule Commonly known as:  COLACE   oxyCODONE 5 MG immediate release tablet Commonly known as:  Oxy IR/ROXICODONE Replaced by:  oxyCODONE 15 mg 12 hr tablet   oxyCODONE-acetaminophen  5-325 MG tablet Commonly known as:  PERCOCET     TAKE these medications   acetaminophen 500 MG tablet Commonly known as:  TYLENOL Take 500 mg by mouth every 6 (six) hours as needed for moderate pain. When taking oxycodone 5 mg tablets   ALPRAZolam 0.25 MG tablet Commonly known as:  XANAX Take 1 tablet (0.25 mg total) by mouth 3 (three) times daily as needed for anxiety.    amLODipine 5 MG tablet Commonly known as:  NORVASC TAKE 1 TABLET BY MOUTH ONCE DAILY   amoxicillin 500 MG capsule Commonly known as:  AMOXIL Take 2,000 mg by mouth once. 1 hour prior to dental appointment   aspirin EC 81 MG tablet Take 81 mg by mouth at bedtime.   atorvastatin 40 MG tablet Commonly known as:  LIPITOR TAKE ONE TABLET BY MOUTH ONCE DAILY What changed:    how much to take  how to take this  when to take this   carvedilol 12.5 MG tablet Commonly known as:  COREG TAKE ONE TABLET BY MOUTH TWICE DAILY   clobetasol cream 0.05 % Commonly known as:  TEMOVATE Apply 1 application topically as needed (for skin rash).   empagliflozin 10 MG Tabs tablet Commonly known as:  JARDIANCE Take 10 mg by mouth daily.   feeding supplement (ENSURE ENLIVE) Liqd Take 237 mLs by mouth 3 (three) times daily between meals.   fluticasone 50 MCG/ACT nasal spray Commonly known as:  FLONASE Place 2 sprays into both nostrils daily. What changed:    when to take this  reasons to take this   glucose blood test strip Commonly known as:  CONTOUR NEXT TEST Checks sugar 4 times daily. DX E11.9-strips for Contour next EZ meter   HYDROmorphone 2 MG tablet Commonly known as:  DILAUDID Take 1 tablet (2 mg total) by mouth every 4 (four) hours as needed for severe pain (breakthrough pain).   Insulin Pen Needle 31G X 5 MM Misc Needs BD ultra fine mini needles to use twice daily DX E11.9   lactulose 20 g packet Commonly known as:  CEPHULAC Take 1 packet (20 g total) by mouth 2 (two) times daily as needed (constipation).   linaclotide 290 MCG Caps capsule Commonly known as:  LINZESS Take 1 capsule (290 mcg total) by mouth daily before breakfast.   losartan 100 MG tablet Commonly known as:  COZAAR TAKE ONE TABLET BY MOUTH ONCE DAILY What changed:    how much to take  how to take this  when to take this   meclizine 25 MG tablet Commonly known as:  ANTIVERT Take 25 mg by  mouth 3 (three) times daily as needed for dizziness.   metFORMIN 1000 MG tablet Commonly known as:  GLUCOPHAGE Take 1 tablet (1,000 mg total) by mouth 2 (two) times daily with a meal.   nystatin cream Commonly known as:  MYCOSTATIN APPLY  CREAM TOPICALLY TWICE DAILY AROUND  THE  GROIN  AREA What changed:  See the new instructions.   ondansetron 4 MG disintegrating tablet Commonly known as:  ZOFRAN ODT Take 1 tablet (4 mg total) by mouth every 8 (eight) hours as needed for nausea or vomiting.   oxybutynin 5 MG tablet Commonly known as:  DITROPAN Take 1 tablet (5 mg total) by mouth every 8 (eight) hours as needed for bladder spasms.   oxyCODONE 15 mg 12 hr tablet Commonly known as:  OXYCONTIN Take 1 tablet (15 mg total) by mouth every 12 (twelve) hours.  Replaces:  oxyCODONE 5 MG immediate release tablet   polyethylene glycol packet Commonly known as:  MIRALAX Take 17 g by mouth 2 (two) times daily. What changed:    when to take this  reasons to take this   senna-docusate 8.6-50 MG tablet Commonly known as:  Senokot-S Take 2 tablets by mouth at bedtime as needed for moderate constipation.   sodium chloride 1 g tablet Take 1 tablet (1 g total) by mouth 3 (three) times daily with meals.   SOOLANTRA 1 % Crea Generic drug:  Ivermectin Apply topically at bedtime. To face   tamsulosin 0.4 MG Caps capsule Commonly known as:  FLOMAX Take 1 capsule (0.4 mg total) by mouth daily.        DISCHARGE INSTRUCTIONS:  If you experience worsening of your admission symptoms, develop shortness of breath, life threatening emergency, suicidal or homicidal thoughts you must seek medical attention immediately by calling 911 or calling your MD immediately  if symptoms less severe.  You Must read complete instructions/literature along with all the possible adverse reactions/side effects for all the Medicines you take and that have been prescribed to you. Take any new Medicines after you  have completely understood and accept all the possible adverse reactions/side effects.   Please note  You were cared for by a hospitalist during your hospital stay. If you have any questions about your discharge medications or the care you received while you were in the hospital after you are discharged, you can call the unit and asked to speak with the hospitalist on call if the hospitalist that took care of you is not available. Once you are discharged, your primary care physician will handle any further medical issues. Please note that NO REFILLS for any discharge medications will be authorized once you are discharged, as it is imperative that you return to your primary care physician (or establish a relationship with a primary care physician if you do not have one) for your aftercare needs so that they can reassess your need for medications and monitor your lab values.    Today   CHIEF COMPLAINT:   Chief Complaint  Patient presents with  . Pain Control    HISTORY OF PRESENT ILLNESS:  72 y.o. male with a known history of urothelial carcinoma left kidney, diabetes mellitus type 2, hyperlipidemia, hypertension, sleep apnea presented to the emergency room with abdominal discomfort.  Patient has had lower abdominal pain.  Abdominal pain is aching in nature 6 out of 10 on a scale of 1-10.  Patient is on oral narcotic medication for pain but it is not controlling it well.  He has generalized weakness.  He appears dry and dehydrated.  Evaluation in the emergency room showed a sodium level of 07/29/2016 patient was started on IV fluids.  He is due for Port-A-Cath placement on Wednesday.  He follows up with oncology in the clinic.  Hospitalist service was consulted for further care. VITAL SIGNS:  Blood pressure (!) 164/86, pulse 72, temperature 98.3 F (36.8 C), resp. rate 18, height 5\' 8"  (1.727 m), weight 73.2 kg (161 lb 4.8 oz), SpO2 96 %.  I/O:    Intake/Output Summary (Last 24 hours) at  12/19/2017 1129 Last data filed at 12/19/2017 0000 Gross per 24 hour  Intake 2200 ml  Output -  Net 2200 ml    PHYSICAL EXAMINATION:  GENERAL:  72 y.o.-year-old patient lying in the bed with no acute distress.  EYES: Pupils equal, round, reactive to light and  accommodation. No scleral icterus. Extraocular muscles intact.  HEENT: Head atraumatic, normocephalic. Oropharynx and nasopharynx clear.  NECK:  Supple, no jugular venous distention. No thyroid enlargement, no tenderness.  LUNGS: Normal breath sounds bilaterally, no wheezing, rales,rhonchi or crepitation. No use of accessory muscles of respiration.  CARDIOVASCULAR: S1, S2 normal. No murmurs, rubs, or gallops.  ABDOMEN: Soft, non-tender, non-distended. Bowel sounds present. No organomegaly or mass.  EXTREMITIES: No pedal edema, cyanosis, or clubbing.  NEUROLOGIC: Cranial nerves II through XII are intact. Muscle strength 5/5 in all extremities. Sensation intact. Gait not checked.  PSYCHIATRIC: The patient is alert and oriented x 3.  SKIN: No obvious rash, lesion, or ulcer.   DATA REVIEW:   CBC Recent Labs  Lab 12/17/17 0148  12/19/17 0113  WBC 10.6  --   --   HGB 11.9*   < > 10.8*  HCT 34.7*   < > 31.8*  PLT 308  --   --    < > = values in this interval not displayed.    Chemistries  Recent Labs  Lab 12/17/17 0148  12/19/17 0113  NA 128*   < > 130*  K 4.1   < > 4.1  CL 90*   < > 96*  CO2 27   < > 25  GLUCOSE 185*   < > 131*  BUN 15   < > 8  CREATININE 0.70   < > 0.69  CALCIUM 9.7   < > 9.1  AST 34  --   --   ALT 34  --   --   ALKPHOS 86  --   --   BILITOT 1.2  --   --    < > = values in this interval not displayed.    Cardiac Enzymes Recent Labs  Lab 12/17/17 0148  TROPONINI <0.03    Microbiology Results  Results for orders placed or performed during the hospital encounter of 12/07/17  Surgical pcr screen     Status: None   Collection Time: 12/07/17  3:28 PM  Result Value Ref Range Status   MRSA,  PCR NEGATIVE NEGATIVE Final   Staphylococcus aureus NEGATIVE NEGATIVE Final    Comment: (NOTE) The Xpert SA Assay (FDA approved for NASAL specimens in patients 67 years of age and older), is one component of a comprehensive surveillance program. It is not intended to diagnose infection nor to guide or monitor treatment. Performed at [facility], [address] [place], [place] [address]     RADIOLOGY:  Bea Graff 2 Views  Result Date: 12/18/2017 CLINICAL DATA:  Abdominal pain EXAM: ABDOMEN - 2 VIEW COMPARISON:  12/05/2017 FINDINGS: Moderate amount of stool in the colon. Nonobstructive bowel gas pattern. No urinary tract calculi. No acute skeletal abnormality. IMPRESSION: Moderate amount of stool in the colon without bowel obstruction. Electronically Signed   By: Franchot Gallo M.D.   On: 12/18/2017 14:42    EKG:   Orders placed or performed during the hospital encounter of 12/17/17  . ED EKG  . ED EKG  . EKG      Management plans discussed with the patient, family and they are in agreement.  CODE STATUS:     Code Status Orders  (From admission, onward)        Start     Ordered   12/17/17 0917  Full code  Continuous     12/17/17 0916    Code Status History    This patient has a current code  status but no historical code status.    Advance Directive Documentation     Most Recent Value  Type of Advance Directive  Healthcare Power of Attorney, Living will  Pre-existing out of facility DNR order (yellow form or pink MOST form)  -  "MOST" Form in Place?  -      TOTAL TIME TAKING CARE OF THIS PATIENT: 45 minutes.    Avel Peace Salary M.D on 12/19/2017 at 11:29 AM  Between 7am to 6pm - Pager - [phone]  After 6pm go to [email or web address] - password [email or web address] [facility]  Office  [phone]  CC: Primary care physician; Jerrol Banana., MD   Note: This dictation was prepared with Dragon dictation along with smaller phrase  technology. Any transcriptional errors that result from this process are unintentional.

## 2017-12-19 NOTE — Telephone Encounter (Signed)
LMOM that follow up appointment has been cancelled and that results will be given by Dr [name] at next appointment.

## 2017-12-19 NOTE — Telephone Encounter (Signed)
Asking if physician in agreement with Palliative care referral and if she will sign orders for it.Please advise

## 2017-12-19 NOTE — Progress Notes (Signed)
New referral for outpatient Palliative to follow at home received from [facility]. Plan is for patient to discharge home today. Patient information faxed to referral. Flo Shanks RN, BSN, [facility] [place], hospital liaison [phone]

## 2017-12-19 NOTE — Telephone Encounter (Signed)
-----   Message from [name], MD sent at 12/19/2017 10:01 AM EDT ----- Regarding: RE: f/u appt Does not need f/u with me.     ----- Message ----- From: Ranell Patrick, RN Sent: 12/19/2017   8:59 AM To: [name], MD Subject: f/u appt                                       I had made a f/u appt to go over pathology results. Does he need to keep that appt? I saw in your op note it says he will see Dr [name] soon & she may give results. Also, he is currently admitted at [facility].  Thanks, Calianne Larue

## 2017-12-19 NOTE — H&P (Signed)
[facility] History & Physical Update  The patient was interviewed and re-examined.  The patient's previous History and Physical has been reviewed and is unchanged.  There is no change in the plan of care. We plan to proceed with the scheduled procedure.  Leotis Pain, MD  12/19/2017, 9:36 AM

## 2017-12-19 NOTE — Op Note (Signed)
      [facility]       Operative Note  Date: 12/19/2017  Preoperative diagnosis:  1. Urothelial cancer  Postoperative diagnosis:  Same as above  Procedures: #1. Ultrasound guidance for vascular access to the right internal jugular vein. #2. Fluoroscopic guidance for placement of catheter. #3. Placement of CT compatible Port-A-Cath, right internal jugular vein.  Surgeon: Leotis Pain, MD.   Anesthesia: Local with moderate conscious sedation for approximately 20  minutes using 2 mg of Versed and 50 mcg of Fentanyl  Fluoroscopy time: less than 1 minute  Contrast used: 0  Estimated blood loss: 5 cc  Indication for the procedure:  The patient is a 72 y.o.male with urothelial cancer.  The patient needs a Port-A-Cath for durable venous access, chemotherapy, lab draws, and CT scans. We are asked to place this. Risks and benefits were discussed and informed consent was obtained.  Description of procedure: The patient was brought to the vascular and interventional radiology suite.  Moderate conscious sedation was administered throughout the procedure during a face to face encounter with the patient with my supervision of the RN administering medicines and monitoring the patient's vital signs, pulse oximetry, telemetry and mental status throughout from the start of the procedure until the patient was taken to the recovery room. The right neck chest and shoulder were sterilely prepped and draped, and a sterile surgical field was created. Ultrasound was used to help visualize a patent right internal jugular vein. This was then accessed under direct ultrasound guidance without difficulty with the Seldinger needle and a permanent image was recorded. A J-wire was placed. After skin nick and dilatation, the peel-away sheath was then placed over the wire. I then anesthetized an area under the clavicle approximately 1-2 fingerbreadths. A transverse incision was created and an inferior  pocket was created with electrocautery and blunt dissection. The port was then brought onto the field, placed into the pocket and secured to the chest wall with 2 Prolene sutures. The catheter was connected to the port and tunneled from the subclavicular incision to the access site. Fluoroscopic guidance was then used to cut the catheter to an appropriate length. The catheter was then placed through the peel-away sheath and the peel-away sheath was removed. The catheter tip was parked in excellent location under fluorocoscopic guidance in the SVC just above the right atrium. The pocket was then irrigated with antibiotic impregnated saline and the wound was closed with a running 3-0 Vicryl and a 4-0 Monocryl. The access incision was closed with a single 4-0 Monocryl. The Huber needle was used to withdraw blood and flush the port with heparinized saline. Dermabond was then placed as a dressing. The patient tolerated the procedure well and was taken to the recovery room in stable condition.   Leotis Pain 12/19/2017 10:02 AM   This note was created with Dragon Medical transcription system. Any errors in dictation are purely unintentional.

## 2017-12-19 NOTE — Progress Notes (Signed)
Daily Progress Note   Patient Name: Patrick Mcgee       Date: 12/19/2017 DOB: 11-13-1945  Age: 72 y.o. MRN#: [id number] Attending Physician: Gorden Harms, MD Primary Care Physician: Jerrol Banana., MD Admit Date: 12/17/2017  Reason for Consultation/Follow-up: Establishing goals of care, Pain control and Psychosocial/spiritual support  Subjective: Met with patient, daughter (who is a Education officer, museum), and wife.  Daughter expressed the desire for options:  Chemo vs no Chemo; is there time for a second opinion; immunotherapy or not?  She would like to have a deeper conversation with Oncology.   We discussed what the patient feels is the minimum acceptable quality of life.  The patient is currently confused but his wife expressed that he does not want to be dependent on others for activities of daily living - "If I can't wipe my own butt ...".  His daughter expressed that he wants to be able to get out of the house to go for a walk with his grandchildren.   If he is bedbound or dependent and unable to go and do - that is not an acceptable quality of life.  Consequently he treatment should be geared towards allowing him to maintain independence and the ability to get out of the house with his grand children.  If he can not do these things he would rather forgo treatment.  The family had a lot of questions about pain control and access to care without going thru the ER.  We talked a lot about maintaining his bowel regimen (his bowels finally worked and he feels better today), and about a new pain medication regimen.    Family wanted to understand the staging of his disease.  We talked about the results of his CT Abdomen on 5/25.  I explained that the cancer is in his left kidney with nodes  in the upper abdomen, retroperitoneum and iliac areas.  He will be discharged on oxycontin 15 mg q 12 hours and oxy IR 5 - 10 mg q 6 hours PRN break thru pain.  If he is requiring frequent break thru doses then the basal dose of oxycontin will need to be revisited.  I explained to Mrs. Kudo that if 1 or 2 doses of break thru pain medication is still not controlling his pain he  should be seen either in clinic or in the ER in case he has an acute abdomen.   His wife feels traumatized from witnessing her husband's excruciating pain and having to wait in the ER 10 - 12 hours before receiving relief (3 times).   Finally we discussed code status.  The patient was too confused to participate so I did not ask for an answer today - rather I just asked for the family to consider the question of resuscitation if he becomes so ill that he arrests.    [employer] Cho[employer] book was provided.   Assessment: 72 yo male with metastatic urothelial cancer.  Suffering with severe obstipation.  Pain now improved after a large bowel movement and the addition of long acting pain medication with PRN/break thru relief.  Family considering whether or not a second opinion would be helpful.  They would like to have deeper conversations with Oncology.   Patient Profile/HPI:  72 y.o. male  with past medical history of MI, DM, OSA, anxiety, and newly diagnosed left urothelial carcinoma who was admitted on 12/17/2017 with severe right sided abdominal pain.  Of note the patient has had 3 ER visits for severe pain since 5/25.  Length of Stay: 1  Current Medications: Scheduled Meds:  . atorvastatin  40 mg Oral Daily  . calcium carbonate  1 tablet Oral BID  . carvedilol  12.5 mg Oral BID  . clobetasol cream  1 application Topical BID  . feeding supplement (ENSURE ENLIVE)  237 mL Oral TID BM  . fluticasone  2 spray Each Nare Daily  . insulin aspart  0-15 Units Subcutaneous TID WC  . insulin aspart  0-5 Units Subcutaneous QHS  .  linaclotide  290 mcg Oral QAC breakfast  . losartan  100 mg Oral Daily  . metFORMIN  1,000 mg Oral BID WC  . oxyCODONE  15 mg Oral Q12H  . pantoprazole  40 mg Oral Daily  . polyethylene glycol  17 g Oral BID  . senna  1 tablet Oral BID  . sodium chloride  1 g Oral TID WC  . tamsulosin  0.4 mg Oral Daily    Continuous Infusions: . sodium chloride 100 mL/hr at 12/19/17 0000    PRN Meds: acetaminophen **OR** acetaminophen, ALPRAZolam, bisacodyl, metoCLOPramide, morphine injection, oxybutynin, oxyCODONE, sodium phosphate  Physical Exam       Thin pale well developed male, lethargic cv rrr resp no distress Abdomen soft, nt, nd, +bs Ext able to stand and walk  Vital Signs: BP (!) 148/89 (BP Location: Right Arm)   Pulse 72   Temp 98.2 F (36.8 C) (Oral)   Resp 18   Ht '5\' 8"'$  (1.727 m)   Wt 73.2 kg (161 lb 4.8 oz)   SpO2 96%   BMI 24.53 kg/m  SpO2: SpO2: 96 % O2 Device: O2 Device: Room Air O2 Flow Rate: O2 Flow Rate (L/min): 3 L/min  Intake/output summary:   Intake/Output Summary (Last 24 hours) at 12/19/2017 1524 Last data filed at 12/19/2017 0000 Gross per 24 hour  Intake 2200 ml  Output -  Net 2200 ml   LBM: Last BM Date: 12/18/17 Baseline Weight: Weight: 73.2 kg (161 lb 4.8 oz) Most recent weight: Weight: 73.2 kg (161 lb 4.8 oz)       Palliative Assessment/Data: 50%    Flowsheet Rows     Most Recent Value  Intake Tab  Referral Department  Hospitalist  Unit at Time of Referral  Med/Surg Unit  Palliative Care Primary Diagnosis  Cancer  Date Notified  12/18/17  Palliative Care Type  New Palliative care  Reason for referral  Non-pain Symptom, Pain  Date of Admission  12/17/17  Date first seen by Palliative Care  12/18/17  # of days Palliative referral response time  0 Day(s)  # of days IP prior to Palliative referral  1  Clinical Assessment  Psychosocial & Spiritual Assessment  Palliative Care Outcomes      Patient Active Problem List   Diagnosis Date  Noted  . Urothelial cancer ([place]) 12/18/2017  . Malnutrition of moderate degree 12/18/2017  . Abdominal pain   . Therapeutic opioid induced constipation   . Palliative care encounter   . Cancer associated pain 12/17/2017  . Dehydration 12/17/2017  . Goals of care, counseling/discussion 12/13/2017  . Urothelial carcinoma of kidney, left ([place]) 12/07/2017  . Right inguinal hernia 07/17/2015  . Family history of colon cancer 07/17/2015  . Allergic rhinitis 11/12/2014  . Airway hyperreactivity 11/12/2014  . Atherosclerosis of coronary artery 11/12/2014  . Cheilitis 11/12/2014  . Narrowing of intervertebral disc space 11/12/2014  . Deflected nasal septum 11/12/2014  . HLD (hyperlipidemia) 11/12/2014  . BP (high blood pressure) 11/12/2014  . Adult hypothyroidism 11/12/2014  . Adiposity 11/12/2014  . Sleep apnea 11/12/2014  . Diabetes mellitus, type 2 ([place]) 11/12/2014  . Arteriosclerosis of coronary artery 10/02/2012  . Degenerative disc disease, lumbar 09/24/2012  . Furunculosis 04/23/2012    Palliative Care Plan    Recommendations/Plan:  D/C with oxycontin 15 mg q 12, and oxy ir 5-10 mg q 6 PRN pain  D/C with Linzess for opioid induced constipation  [facility] appointment scheduled for 6/13 at 1:15 pm  PATIENT IS AT HIGH RISK FOR FURTHER ER VISITS.  HE HAS HAD 3 ER VISITS IN 2 WEEKS.  Palliative Care thru [place] to follow outpatient to assist the family   Goals of Care and Additional Recommendations:  Limitations on Scope of Treatment: Full Scope Treatment  Code Status:  Full code  Prognosis:   Unable to determine.  Should he opt for no oncological treatment it would not be surprising if his prognosis was less than 6 months given his dramatic weight loss, functional decline and severe symptoms.   Discharge Planning:  Home with [place] was discussed with family, bedside RN  Thank you for allowing the Palliative  Medicine Team to assist in the care of this patient.  Total time spent:  75 min. Time in 1:00 pm Time out 2:15 pm     Greater than 50%  of this time was spent counseling and coordinating care related to the above assessment and plan.  Florentina Jenny, PA-C Palliative Medicine  Please contact Palliative MedicineTeam phone at [phone] for questions and concerns between 7 am - 7 pm.   Please see AMION for individual provider pager numbers.

## 2017-12-19 NOTE — Telephone Encounter (Signed)
We do not wish to pursue palliative care referral at this time. Patient stands to benefit from treatment. The last conversation that we had with them was left with them deciding on treatment options. His quality of life stands to be improved with treatment. Continue with plan of care as per last note.

## 2017-12-20 ENCOUNTER — Telehealth: Payer: Self-pay

## 2017-12-20 ENCOUNTER — Inpatient Hospital Stay: Payer: Medicare Other

## 2017-12-20 ENCOUNTER — Other Ambulatory Visit: Payer: Self-pay | Admitting: *Deleted

## 2017-12-20 ENCOUNTER — Inpatient Hospital Stay (HOSPITAL_BASED_OUTPATIENT_CLINIC_OR_DEPARTMENT_OTHER): Payer: Medicare Other | Admitting: Oncology

## 2017-12-20 ENCOUNTER — Inpatient Hospital Stay: Payer: Medicare Other | Admitting: Oncology

## 2017-12-20 ENCOUNTER — Other Ambulatory Visit: Payer: Medicare Other

## 2017-12-20 VITALS — BP 104/69 | HR 72 | Temp 97.6°F | Resp 14

## 2017-12-20 DIAGNOSIS — C642 Malignant neoplasm of left kidney, except renal pelvis: Secondary | ICD-10-CM | POA: Diagnosis not present

## 2017-12-20 DIAGNOSIS — I10 Essential (primary) hypertension: Secondary | ICD-10-CM

## 2017-12-20 DIAGNOSIS — K5903 Drug induced constipation: Secondary | ICD-10-CM | POA: Diagnosis not present

## 2017-12-20 DIAGNOSIS — R319 Hematuria, unspecified: Secondary | ICD-10-CM | POA: Diagnosis not present

## 2017-12-20 DIAGNOSIS — R109 Unspecified abdominal pain: Secondary | ICD-10-CM | POA: Diagnosis not present

## 2017-12-20 DIAGNOSIS — E119 Type 2 diabetes mellitus without complications: Secondary | ICD-10-CM

## 2017-12-20 DIAGNOSIS — E860 Dehydration: Secondary | ICD-10-CM

## 2017-12-20 DIAGNOSIS — T50905A Adverse effect of unspecified drugs, medicaments and biological substances, initial encounter: Secondary | ICD-10-CM

## 2017-12-20 DIAGNOSIS — I2510 Atherosclerotic heart disease of native coronary artery without angina pectoris: Secondary | ICD-10-CM | POA: Diagnosis not present

## 2017-12-20 DIAGNOSIS — R404 Transient alteration of awareness: Secondary | ICD-10-CM

## 2017-12-20 DIAGNOSIS — I951 Orthostatic hypotension: Secondary | ICD-10-CM | POA: Diagnosis not present

## 2017-12-20 DIAGNOSIS — E1140 Type 2 diabetes mellitus with diabetic neuropathy, unspecified: Secondary | ICD-10-CM | POA: Diagnosis not present

## 2017-12-20 DIAGNOSIS — Z7984 Long term (current) use of oral hypoglycemic drugs: Secondary | ICD-10-CM

## 2017-12-20 DIAGNOSIS — Z5111 Encounter for antineoplastic chemotherapy: Secondary | ICD-10-CM | POA: Diagnosis not present

## 2017-12-20 DIAGNOSIS — Z95828 Presence of other vascular implants and grafts: Secondary | ICD-10-CM

## 2017-12-20 DIAGNOSIS — Z960 Presence of urogenital implants: Secondary | ICD-10-CM | POA: Diagnosis not present

## 2017-12-20 MED ORDER — HEPARIN SOD (PORK) LOCK FLUSH 100 UNIT/ML IV SOLN
500.0000 [IU] | Freq: Once | INTRAVENOUS | Status: AC
  Administered 2017-12-20: 500 [IU] via INTRAVENOUS

## 2017-12-20 MED ORDER — LIDOCAINE-PRILOCAINE 2.5-2.5 % EX CREA: 1.0000 "application " | TOPICAL_CREAM | CUTANEOUS | 3 refills | Status: DC | PRN

## 2017-12-20 MED ORDER — SODIUM CHLORIDE 0.9 % IV SOLN
INTRAVENOUS | Status: AC
  Administered 2017-12-20: 15:00:00 via INTRAVENOUS
  Filled 2017-12-20: qty 1000

## 2017-12-20 MED ORDER — SODIUM CHLORIDE 0.9% FLUSH
10.0000 mL | INTRAVENOUS | Status: DC | PRN
  Administered 2017-12-20: 10 mL via INTRAVENOUS
  Filled 2017-12-20: qty 10

## 2017-12-20 NOTE — Telephone Encounter (Signed)
Palliative Care informed of doctor response

## 2017-12-20 NOTE — Progress Notes (Signed)
Well Chemo Visit  Subjective:  Patient ID: Patrick Mcgee, male    DOB: [birth year]  Age: 72 y.o. MRN: [id number]  CC: Chemo Care  HPI Patrick Mcgee presents for chemo care.   History Patient was last seen by primary medical oncologist Dr. [name] on 12/13/2017 where they discussed his most recent chest CT on 12/07/2017 and bilateral retrograde pyelogram, cystoscopy, left ureteroscopy with the renal pelvis biopsy and left ureteral stent placement.  Recent chest CT revealed no evidence of distant metastatic disease.  Findings from ureteroscopy and biopsy included a large nodular vascular tumor within the left lower pole collecting system completely obstructing the entire left lower pole.  Pathology revealed small fragments of high-grade urothelial carcinoma with a small focus of invasion.  He stated his abdominal pain was controlled with current pain regimen.  He denied any fevers, chills or sweats.  Blood pressure remained controlled with 3 blood pressure medications.  His appetite remained good.    They discussed treatment including carboplatin with gemcitabine.  They reviewed side effects.  They discussed Port-A-Cath placement and chemo class.  They sent tissue for additional testing for genetic mutations.  He was to meet with research to discuss a clinical trial (exact sciences trial).    Oncology History   Patrick Mcgee is a 72 y.o. 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  He presented with abdominal pain and weight loss.  Abdomen and pelvic CT on 12/01/2017 revealed a suspicious enhancing, filling defect involving the lower pole collecting system of the left kidney with asymmetric hypoenhancement of the inferior pole cortex of left kidney noted. Findings were worrisome for urothelial neoplasm.  There was abdominal and pelvic adenopathy compatible with nodal metastasis.  There was a 2.6 cm right para celiac node, 1,6 cm left  retroperitoneal node, 1.4 cm right retroperitoneal node, 2.1 cm right common iliac node and 1 cm left external iliac node.  There was aortic atherosclerosis, kidney cysts, and prostate gland enlargement.  Chest CT on 12/07/2017 revealed no evidence of metastatic disease.    He underwent left ureteroscopy with renal pelvis biopsy and left ureteral stent placement on 12/10/2017.  Findings included a large nodular, vasculartumor within the left lower pole collecting system completely obstructing the entire left lower pole moiety. Ureter, right kidney, and bladder was normal.  Pathology revealed small fragments of high-grade urothelial carcinoma with a small focus of invasion.  He has diabetes and coronary artery disease s/p MI in 1995.  He is s/p stent plaecment x 5-6. Colonoscopy in 2018 was negative.        Urothelial carcinoma of kidney, left ([place])   12/07/2017 Initial Diagnosis    Urothelial carcinoma of kidney, left ([place])      12/17/2017 -  Chemotherapy    The patient had palonosetron (ALOXI) injection 0.25 mg, 0.25 mg, Intravenous,  Once, 0 of 4 cycles CARBOplatin (PARAPLATIN) in sodium chloride 0.9 % 100 mL chemo infusion, , Intravenous,  Once, 0 of 4 cycles gemcitabine (GEMZAR) 1,862 mg in sodium chloride 0.9 % 100 mL chemo infusion, 1,000 mg/m2 = 1,862 mg, Intravenous,  Once, 0 of 4 cycles  for chemotherapy treatment.        Patient Active Problem List   Diagnosis Date Noted  . Malignant neoplasm of kidney ([place])   . Urothelial cancer ([place]) 12/18/2017  . Malnutrition of moderate degree 12/18/2017  . Abdominal pain   . Therapeutic opioid induced constipation   .  Palliative care encounter   . Cancer associated pain 12/17/2017  . Dehydration 12/17/2017  . Goals of care, counseling/discussion 12/13/2017  . Urothelial carcinoma of kidney, left ([place]) 12/07/2017  . Right inguinal hernia 07/17/2015  . Family history of colon cancer 07/17/2015  . Allergic rhinitis 11/12/2014    . Airway hyperreactivity 11/12/2014  . Atherosclerosis of coronary artery 11/12/2014  . Cheilitis 11/12/2014  . Narrowing of intervertebral disc space 11/12/2014  . Deflected nasal septum 11/12/2014  . HLD (hyperlipidemia) 11/12/2014  . BP (high blood pressure) 11/12/2014  . Adult hypothyroidism 11/12/2014  . Adiposity 11/12/2014  . Sleep apnea 11/12/2014  . Diabetes mellitus, type 2 ([place]) 11/12/2014  . Arteriosclerosis of coronary artery 10/02/2012  . Degenerative disc disease, lumbar 09/24/2012  . Furunculosis 04/23/2012   Past Medical History:  Diagnosis Date  . Acid reflux   . Anxiety   . Cancer ([place])    renal mass  . Depression   . Diabetes mellitus without complication ([place])   . Hyperlipidemia   . Hypertension   . Myocardial infarction ([place]) 1995  . Sleep apnea    Past Surgical History:  Procedure Laterality Date  . CATARACT EXTRACTION Left   . COLONOSCOPY  2010   [facility]  . COLONOSCOPY WITH PROPOFOL N/A 10/04/2016   Procedure: COLONOSCOPY WITH PROPOFOL;  Surgeon: [name], MD;  Location[facility] END[facility]COPY;  Service: Endoscopy;  Laterality: N/A;  . [place], 2000, 2001, 2014  . CYSTOSCOPY W/ RETROGRADES Bilateral 12/10/2017   Procedure: CYSTOSCOPY WITH RETROGRADE PYELOGRAM;  Surgeon: [name], MD;  Location: [facility] ORS;  Service: Urology;  Laterality: Bilateral;  . CYSTOSCOPY WITH STENT PLACEMENT Left 12/10/2017   Procedure: CYSTOSCOPY WITH STENT PLACEMENT;  Surgeon: [name], MD;  Location: [facility] ORS;  Service: Urology;  Laterality: Left;  . INGUINAL HERNIA REPAIR Right 08/09/2015   Procedure: HERNIA REPAIR INGUINAL ADULT;  Surgeon: [name], MD;  Location: [facility] ORS;  Service: General;  Laterality: Right;  . NASAL SINUS SURGERY    . nuclear stress test    . PORTA CATH INSERTION N/A 12/19/2017   Procedure: PORTA CATH INSERTION;  Surgeon: [name], MD;  Location: [place] [facility];  Service:  Cardiovascular;  Laterality: N/A;  . URETERAL BIOPSY Left 12/10/2017   Procedure: URETERAL & renal PELVIS BIOPSY;  Surgeon: [name], MD;  Location: [facility] ORS;  Service: Urology;  Laterality: Left;  . URETEROSCOPY Left 12/10/2017   Procedure: URETEROSCOPY;  Surgeon: [name], MD;  Location: [facility] ORS;  Service: Urology;  Laterality: Left;   Allergies  Allergen Reactions  . Sulfa Antibiotics Other (See Comments)    Joint pain  . Ace Inhibitors Cough  . Invokana [Canagliflozin] Other (See Comments)    Leg pain  . Penicillins Rash    Has patient had a PCN reaction causing immediate rash, facial/tongue/throat swelling, SOB or lightheadedness with hypotension: Yes Has patient had a PCN reaction causing severe rash involving mucus membranes or skin necrosis: No Has patient had a PCN reaction that required hospitalization: No Has patient had a PCN reaction occurring within the last 10 years: No If all of the above answers are "NO", then may proceed with Cephalosporin use.   Prior to Admission medications   Medication Sig Start Date End Date Taking? Authorizing Provider  acetaminophen (TYLENOL) 500 MG tablet Take 500 mg by mouth every 6 (six) hours as needed for moderate pain. When taking oxycodone 5 mg tablets  [provider]  ALPRAZolam Duanne Moron) 0.25 MG tablet Take 1 tablet (0.25 mg total) by mouth 3 (three) times daily as needed for anxiety. 12/19/17   Salary, Holly Bodily D, MD  amLODipine (NORVASC) 5 MG tablet TAKE 1 TABLET BY MOUTH ONCE DAILY Patient not taking: Reported on 12/17/2017 12/04/17   Jerrol Banana., MD  amoxicillin (AMOXIL) 500 MG capsule Take 2,000 mg by mouth once. 1 hour prior to dental appointment    [provider]  aspirin EC 81 MG tablet Take 81 mg by mouth at bedtime.    [provider]  atorvastatin (LIPITOR) 40 MG tablet TAKE ONE TABLET BY MOUTH ONCE DAILY Patient taking differently: TAKE ONE TABLET BY MOUTH DAILY AT BEDTIME  04/16/17   Jerrol Banana., MD  carvedilol (COREG) 12.5 MG tablet TAKE ONE TABLET BY MOUTH TWICE DAILY 07/12/17   Jerrol Banana., MD  clobetasol cream (TEMOVATE) 0.63 % Apply 1 application topically as needed (for skin rash).     [provider]  empagliflozin (JARDIANCE) 10 MG TABS tablet Take 10 mg by mouth daily. Patient not taking: Reported on 12/07/2017 11/23/17   Virginia Crews, MD  feeding supplement, ENSURE ENLIVE, (ENSURE ENLIVE) LIQD Take 237 mLs by mouth 3 (three) times daily between meals. 12/19/17   Salary, Avel Peace, MD  fluticasone (FLONASE) 50 MCG/ACT nasal spray Place 2 sprays into both nostrils daily. Patient taking differently: Place 2 sprays into both nostrils daily as needed for allergies or rhinitis.  09/19/17   Jerrol Banana., MD  glucose blood (CONTOUR NEXT TEST) test strip Checks sugar 4 times daily. DX E11.9-strips for Contour next EZ meter 05/21/17   Jerrol Banana., MD  HYDROmorphone (DILAUDID) 2 MG tablet Take 1 tablet (2 mg total) by mouth every 4 (four) hours as needed for severe pain (breakthrough pain). 12/19/17 12/19/18  Salary, Avel Peace, MD  HYDROmorphone (DILAUDID) 2 MG tablet Take 1 tablet (2 mg total) by mouth every 6 (six) hours as needed for severe pain (breakthrough pain). Patient not taking: Reported on 12/20/2017 12/19/17 12/19/18  Salary, Holly Bodily D, MD  Insulin Pen Needle 31G X 5 MM MISC Needs BD ultra fine mini needles to use twice daily DX E11.9 02/22/17   Jerrol Banana., MD  Ivermectin (SOOLANTRA) 1 % CREA Apply topically at bedtime. To face    [provider]  lactulose (CEPHULAC) 20 g packet Take 1 packet (20 g total) by mouth 2 (two) times daily as needed (constipation). Patient not taking: Reported on 12/20/2017 12/19/17   Salary, Avel Peace, MD  linaclotide Rolan Lipa) 290 MCG CAPS capsule Take 1 capsule (290 mcg total) by mouth daily before breakfast. 12/19/17   Salary, Avel Peace, MD  losartan (COZAAR)  100 MG tablet TAKE ONE TABLET BY MOUTH ONCE DAILY Patient taking differently: TAKE ONE TABLET BY MOUTH DAILY AT BEDTIME 05/24/17   Jerrol Banana., MD  meclizine (ANTIVERT) 25 MG tablet Take 25 mg by mouth 3 (three) times daily as needed for dizziness.    [provider]  metFORMIN (GLUCOPHAGE) 1000 MG tablet Take 1 tablet (1,000 mg total) by mouth 2 (two) times daily with a meal. 06/14/17   Jerrol Banana., MD  nystatin cream (MYCOSTATIN) APPLY  CREAM TOPICALLY TWICE DAILY AROUND  THE  GROIN  AREA Patient taking differently: APPLY  CREAM TOPICALLY TWICE DAILY AROUND  THE  GROIN  AREA AS NEEDED FOR SKIN RASH 04/10/16  Jerrol Banana., MD  ondansetron (ZOFRAN ODT) 4 MG disintegrating tablet Take 1 tablet (4 mg total) by mouth every 8 (eight) hours as needed for nausea or vomiting. Patient not taking: Reported on 12/17/2017 12/01/17   Earleen Newport, MD  oxybutynin (DITROPAN) 5 MG tablet Take 1 tablet (5 mg total) by mouth every 8 (eight) hours as needed for bladder spasms. Patient not taking: Reported on 12/20/2017 12/10/17   [name], MD  oxyCODONE (OXYCONTIN) 15 mg 12 hr tablet Take 1 tablet (15 mg total) by mouth every 12 (twelve) hours. 12/19/17   Salary, Avel Peace, MD  polyethylene glycol (MIRALAX) packet Take 17 g by mouth 2 (two) times daily. Patient taking differently: Take 17 g by mouth 2 (two) times daily as needed for moderate constipation.  12/05/17   Loney Hering, MD  senna-docusate (SENOKOT-S) 8.6-50 MG tablet Take 2 tablets by mouth at bedtime as needed for moderate constipation.    [provider]  sodium chloride 1 g tablet Take 1 tablet (1 g total) by mouth 3 (three) times daily with meals. 12/19/17   Salary, Avel Peace, MD  tamsulosin (FLOMAX) 0.4 MG CAPS capsule Take 1 capsule (0.4 mg total) by mouth daily. 12/10/17   [name], MD   Social History   Socioeconomic History  . Marital status: Married    Spouse name: Not on file   . Number of children: Not on file  . Years of education: Not on file  . Highest education level: Not on file  Occupational History  . Occupation: retired  Scientific laboratory technician  . Financial resource strain: Not on file  . Food insecurity:    Worry: Not on file    Inability: Not on file  . Transportation needs:    Medical: Not on file    Non-medical: Not on file  Tobacco Use  . Smoking status: Never Smoker  . Smokeless tobacco: Former Systems developer    Types: Chew  Substance and Sexual Activity  . Alcohol use: Yes    Alcohol/week: 0.6 - 3.0 oz    Types: 1 Standard drinks or equivalent per week    Comment: rarely  . Drug use: No  . Sexual activity: Not Currently  Lifestyle  . Physical activity:    Days per week: Not on file    Minutes per session: Not on file  . Stress: Not on file  Relationships  . Social connections:    Talks on phone: Not on file    Gets together: Not on file    Attends religious service: Not on file    Active member of club or organization: Not on file    Attends meetings of clubs or organizations: Not on file    Relationship status: Not on file  . Intimate partner violence:    Fear of current or ex partner: Not on file    Emotionally abused: Not on file    Physically abused: Not on file    Forced sexual activity: Not on file  Other Topics Concern  . Not on file  Social History Narrative  . Not on file     Objective:  BP (S) 104/69 (BP Location: Left Arm, Patient Position: Sitting) Comment: 83/50 Mcgee  Pulse 72 Comment: 76  Temp 97.6 F (36.4 C) (Tympanic)   Resp 14   SpO2 94%   Physical Exam  Constitutional: He is oriented to person, place, and time. Vital signs are normal. He appears well-developed and well-nourished. He  appears lethargic.  HENT:  Head: Normocephalic and atraumatic.  Eyes: Pupils are equal, round, and reactive to light.  Neck: Normal range of motion.  Cardiovascular: Normal rate, regular rhythm and normal heart sounds.  No  murmur heard. Pulmonary/Chest: Effort normal and breath sounds normal. He has no wheezes.  Abdominal: Soft. Normal appearance and bowel sounds are normal. He exhibits no distension. There is no tenderness.  Musculoskeletal: Normal range of motion. He exhibits no edema.  Neurological: He is oriented to person, place, and time. He appears lethargic. He displays no seizure activity.  Skin: Skin is warm and dry. No rash noted. There is pallor.  Psychiatric: Judgment normal.   Review of Systems  Constitutional: Positive for malaise/fatigue and weight loss. Negative for chills and fever.  HENT: Negative for congestion and ear pain.   Eyes: Negative.  Negative for blurred vision and double vision.  Respiratory: Negative.  Negative for cough, sputum production and shortness of breath.   Cardiovascular: Negative.  Negative for chest pain, palpitations and leg swelling.  Gastrointestinal: Positive for abdominal pain and constipation. Negative for diarrhea, nausea and vomiting.  Genitourinary: Negative for dysuria, frequency and urgency.  Musculoskeletal: Negative for back pain and falls.  Skin: Negative.  Negative for rash.  Neurological: Positive for weakness. Negative for headaches.  Endo/Heme/Allergies: Negative.  Does not bruise/bleed easily.  Psychiatric/Behavioral: Negative.  Negative for depression. The patient is not nervous/anxious and does not have insomnia.        Lethargic     Assessment & Plan:  KEROLOS NEHME is a 71 y.o. male who presents for chemo care visit.  Patient with recent diagnosis of high-grade urothelial carcinoma.  To initate cycle 1 of carbo/gemcitabine on 12/21/17.  Has history of diabetes, hypertension and CAD.   Urothelial cancer: Scheduled to begin chemotherapy tomorrow 12/21/2017 with Norma Fredrickson gemcitabine cycle 1.  Initially scheduled for 12/14/2017 but was hospitalized for severe abdominal pain d/t constipation.  Scheduled for chemo class today.  Unfortunately patient  was too sedated to attend.  Had port placed yesterday.  Wife attended his place.  He was discharged from the hospital with p.o. Dilaudid 2 mg q 4 hours for break through pain along with already prescribed OxyContin 15 mg 12 hr tablet.  Wife tells me this morning patient took his OxyContin at 6 AM then 2 mg Dilaudid at 8 AM and again at noon.  Patient is orthostatic: Sitting BP 104/69.  Mcgee BP 83/50.  Heart rate 72.  Oxygen saturation is 94%.  He is afebrile. Will give patient 1.5 L NaCl while wife attends chemo class. Patient much improved after fluids today.  Blood pressure 127/82 sitting.  BP 122/80 Mcgee.  Heart rate 82. He remained afebrile.  Patient capable of carrying conversation and remain awake after fluids. No labs drawn today.  Diabetes: Continue metformin as prescribed by PCP.  Patient checks blood sugars at home.   Hypertension: Previously on 3 blood pressure medicines.  Taken off 1 due to hypotension while inpatient.  Currently taking Coreg 12.5 mg and losartan 100 mg daily.  Patient is  orthostatic hypotensive today.  He admits to taking his blood pressures today.   CAD: Currently on atorvastatin 40 mg tablets and aspirin 81 mg.  Stable.  Opioid-induced constipation: Recently started on Linzess after hospital admission.  Having regular bowel movements.  Last BM was last night.  Diagnosis:  Urothelial carcinoma of kidney, left ([facility])  Dehydration  Sedated due to medication   Greater than 50%  was spent in counseling and coordination of care with this patient including but not limited to discussion of the relevant topics above (See A&P) including, but not limited to diagnosis and management of acute and chronic medical conditions.   Faythe Casa, NP 12/21/2017 9:18 AM

## 2017-12-20 NOTE — Telephone Encounter (Signed)
Transition Care Management Follow-up Telephone Call  How have you been since you were released from the hospital? Doing well, slept good, having normal BMs. Area where porta cath is, is doing well. Area was sore yesterday but is better today. Has had slight pain on the right abdominal area.   Do you understand why you were in the hospital? yes  Do you have a copy of your discharge instructions Yes Do you understand the discharge instrcutions? yes  Where were you discharged to? Home  Do you have support at home? Yes    Items Reviewed:  Medications obtained Yes  Medications reviewed: Yes  Dietary changes reviewed: yes, high protein and high calorie diet  Home Health? N/A  DME ordered at discharge obtained? No  Medical supplies: NA    Functional Questionnaire:   Activities of Daily Living (ADLs):   He states they are independent in the following: ambulation, bathing and hygiene, feeding, continence, grooming, toileting and dressing States they require assistance with the following: medication management  Any transportation issues/concerns?: no  Any patient concerns? yes, pain management and resources wanted for PRN assistance when pt has these episodes.  Confirmed importance and date/time of follow-up visits scheduled with PCP: yes, 12/24/17 @ 11:20  Confirm appointment scheduled with specialist? Yes  Confirmed with patient if condition begins to worsen call PCP or If it's emergency go to the ER.

## 2017-12-20 NOTE — Patient Instructions (Signed)
Gemcitabine injection What is this medicine? GEMCITABINE (jem SIT a been) is a chemotherapy drug. This medicine is used to treat many types of cancer like breast cancer, lung cancer, pancreatic cancer, and ovarian cancer. This medicine may be used for other purposes; ask your health care provider or pharmacist if you have questions. COMMON BRAND NAME(S): Gemzar What should I tell my health care provider before I take this medicine? They need to know if you have any of these conditions: -blood disorders -infection -kidney disease -liver disease -recent or ongoing radiation therapy -an unusual or allergic reaction to gemcitabine, other chemotherapy, other medicines, foods, dyes, or preservatives -pregnant or trying to get pregnant -breast-feeding How should I use this medicine? This drug is given as an infusion into a vein. It is administered in a hospital or clinic by a specially trained health care professional. Talk to your pediatrician regarding the use of this medicine in children. Special care may be needed. Overdosage: If you think you have taken too much of this medicine contact a poison control center or emergency room at once. NOTE: This medicine is only for you. Do not share this medicine with others. What if I miss a dose? It is important not to miss your dose. Call your doctor or health care professional if you are unable to keep an appointment. What may interact with this medicine? -medicines to increase blood counts like filgrastim, pegfilgrastim, sargramostim -some other chemotherapy drugs like cisplatin -vaccines Talk to your doctor or health care professional before taking any of these medicines: -acetaminophen -aspirin -ibuprofen -ketoprofen -naproxen This list may not describe all possible interactions. Give your health care provider a list of all the medicines, herbs, non-prescription drugs, or dietary supplements you use. Also tell them if you smoke, drink alcohol,  or use illegal drugs. Some items may interact with your medicine. What should I watch for while using this medicine? Visit your doctor for checks on your progress. This drug may make you feel generally unwell. This is not uncommon, as chemotherapy can affect healthy cells as well as cancer cells. Report any side effects. Continue your course of treatment even though you feel ill unless your doctor tells you to stop. In some cases, you may be given additional medicines to help with side effects. Follow all directions for their use. Call your doctor or health care professional for advice if you get a fever, chills or sore throat, or other symptoms of a cold or flu. Do not treat yourself. This drug decreases your body's ability to fight infections. Try to avoid being around people who are sick. This medicine may increase your risk to bruise or bleed. Call your doctor or health care professional if you notice any unusual bleeding. Be careful brushing and flossing your teeth or using a toothpick because you may get an infection or bleed more easily. If you have any dental work done, tell your dentist you are receiving this medicine. Avoid taking products that contain aspirin, acetaminophen, ibuprofen, naproxen, or ketoprofen unless instructed by your doctor. These medicines may hide a fever. Women should inform their doctor if they wish to become pregnant or think they might be pregnant. There is a potential for serious side effects to an unborn child. Talk to your health care professional or pharmacist for more information. Do not breast-feed an infant while taking this medicine. What side effects may I notice from receiving this medicine? Side effects that you should report to your doctor or health care professional as   soon as possible: -allergic reactions like skin rash, itching or hives, swelling of the face, lips, or tongue -low blood counts - this medicine may decrease the number of white blood cells,  red blood cells and platelets. You may be at increased risk for infections and bleeding. -signs of infection - fever or chills, cough, sore throat, pain or difficulty passing urine -signs of decreased platelets or bleeding - bruising, pinpoint red spots on the skin, black, tarry stools, blood in the urine -signs of decreased red blood cells - unusually weak or tired, fainting spells, lightheadedness -breathing problems -chest pain -mouth sores -nausea and vomiting -pain, swelling, redness at site where injected -pain, tingling, numbness in the hands or feet -stomach pain -swelling of ankles, feet, hands -unusual bleeding Side effects that usually do not require medical attention (report to your doctor or health care professional if they continue or are bothersome): -constipation -diarrhea -hair loss -loss of appetite -stomach upset This list may not describe all possible side effects. Call your doctor for medical advice about side effects. You may report side effects to FDA at 1-800-FDA-1088. Where should I keep my medicine? This drug is given in a hospital or clinic and will not be stored at home. NOTE: This sheet is a summary. It may not cover all possible information. If you have questions about this medicine, talk to your doctor, pharmacist, or health care provider.  2018 [employer] Standard (2007-11-05 18:45:54) Carboplatin injection What is this medicine? CARBOPLATIN (KAR boe pla tin) is a chemotherapy drug. It targets fast dividing cells, like cancer cells, and causes these cells to die. This medicine is used to treat ovarian cancer and many other cancers. This medicine may be used for other purposes; ask your health care provider or pharmacist if you have questions. COMMON BRAND NAME(S): Paraplatin What should I tell my health care provider before I take this medicine? They need to know if you have any of these conditions: -blood disorders -hearing problems -kidney  disease -recent or ongoing radiation therapy -an unusual or allergic reaction to carboplatin, cisplatin, other chemotherapy, other medicines, foods, dyes, or preservatives -pregnant or trying to get pregnant -breast-feeding How should I use this medicine? This drug is usually given as an infusion into a vein. It is administered in a hospital or clinic by a specially trained health care professional. Talk to your pediatrician regarding the use of this medicine in children. Special care may be needed. Overdosage: If you think you have taken too much of this medicine contact a poison control center or emergency room at once. NOTE: This medicine is only for you. Do not share this medicine with others. What if I miss a dose? It is important not to miss a dose. Call your doctor or health care professional if you are unable to keep an appointment. What may interact with this medicine? -medicines for seizures -medicines to increase blood counts like filgrastim, pegfilgrastim, sargramostim -some antibiotics like amikacin, gentamicin, neomycin, streptomycin, tobramycin -vaccines Talk to your doctor or health care professional before taking any of these medicines: -acetaminophen -aspirin -ibuprofen -ketoprofen -naproxen This list may not describe all possible interactions. Give your health care provider a list of all the medicines, herbs, non-prescription drugs, or dietary supplements you use. Also tell them if you smoke, drink alcohol, or use illegal drugs. Some items may interact with your medicine. What should I watch for while using this medicine? Your condition will be monitored carefully while you are receiving this medicine. You will need   important blood work done while you are taking this medicine. This drug may make you feel generally unwell. This is not uncommon, as chemotherapy can affect healthy cells as well as cancer cells. Report any side effects. Continue your course of treatment even  though you feel ill unless your doctor tells you to stop. In some cases, you may be given additional medicines to help with side effects. Follow all directions for their use. Call your doctor or health care professional for advice if you get a fever, chills or sore throat, or other symptoms of a cold or flu. Do not treat yourself. This drug decreases your body's ability to fight infections. Try to avoid being around people who are sick. This medicine may increase your risk to bruise or bleed. Call your doctor or health care professional if you notice any unusual bleeding. Be careful brushing and flossing your teeth or using a toothpick because you may get an infection or bleed more easily. If you have any dental work done, tell your dentist you are receiving this medicine. Avoid taking products that contain aspirin, acetaminophen, ibuprofen, naproxen, or ketoprofen unless instructed by your doctor. These medicines may hide a fever. Do not become pregnant while taking this medicine. Women should inform their doctor if they wish to become pregnant or think they might be pregnant. There is a potential for serious side effects to an unborn child. Talk to your health care professional or pharmacist for more information. Do not breast-feed an infant while taking this medicine. What side effects may I notice from receiving this medicine? Side effects that you should report to your doctor or health care professional as soon as possible: -allergic reactions like skin rash, itching or hives, swelling of the face, lips, or tongue -signs of infection - fever or chills, cough, sore throat, pain or difficulty passing urine -signs of decreased platelets or bleeding - bruising, pinpoint red spots on the skin, black, tarry stools, nosebleeds -signs of decreased red blood cells - unusually weak or tired, fainting spells, lightheadedness -breathing problems -changes in hearing -changes in vision -chest pain -high  blood pressure -low blood counts - This drug may decrease the number of white blood cells, red blood cells and platelets. You may be at increased risk for infections and bleeding. -nausea and vomiting -pain, swelling, redness or irritation at the injection site -pain, tingling, numbness in the hands or feet -problems with balance, talking, walking -trouble passing urine or change in the amount of urine Side effects that usually do not require medical attention (report to your doctor or health care professional if they continue or are bothersome): -hair loss -loss of appetite -metallic taste in the mouth or changes in taste This list may not describe all possible side effects. Call your doctor for medical advice about side effects. You may report side effects to FDA at 1-800-FDA-1088. Where should I keep my medicine? This drug is given in a hospital or clinic and will not be stored at home. NOTE: This sheet is a summary. It may not cover all possible information. If you have questions about this medicine, talk to your doctor, pharmacist, or health care provider.  2018 [employer] Standard (2007-10-01 14:38:05)  

## 2017-12-21 ENCOUNTER — Encounter: Payer: Self-pay | Admitting: Hematology and Oncology

## 2017-12-21 ENCOUNTER — Inpatient Hospital Stay (HOSPITAL_BASED_OUTPATIENT_CLINIC_OR_DEPARTMENT_OTHER): Payer: Medicare Other | Admitting: Hematology and Oncology

## 2017-12-21 ENCOUNTER — Inpatient Hospital Stay: Payer: Medicare Other

## 2017-12-21 ENCOUNTER — Telehealth: Payer: Self-pay | Admitting: Family Medicine

## 2017-12-21 VITALS — BP 165/79 | HR 73 | Temp 97.6°F | Resp 16 | Wt 176.0 lb

## 2017-12-21 DIAGNOSIS — E119 Type 2 diabetes mellitus without complications: Secondary | ICD-10-CM | POA: Diagnosis not present

## 2017-12-21 DIAGNOSIS — C778 Secondary and unspecified malignant neoplasm of lymph nodes of multiple regions: Secondary | ICD-10-CM

## 2017-12-21 DIAGNOSIS — G893 Neoplasm related pain (acute) (chronic): Secondary | ICD-10-CM

## 2017-12-21 DIAGNOSIS — I2510 Atherosclerotic heart disease of native coronary artery without angina pectoris: Secondary | ICD-10-CM | POA: Diagnosis not present

## 2017-12-21 DIAGNOSIS — R109 Unspecified abdominal pain: Secondary | ICD-10-CM

## 2017-12-21 DIAGNOSIS — K5903 Drug induced constipation: Secondary | ICD-10-CM | POA: Diagnosis not present

## 2017-12-21 DIAGNOSIS — E1142 Type 2 diabetes mellitus with diabetic polyneuropathy: Secondary | ICD-10-CM

## 2017-12-21 DIAGNOSIS — C642 Malignant neoplasm of left kidney, except renal pelvis: Secondary | ICD-10-CM | POA: Diagnosis not present

## 2017-12-21 DIAGNOSIS — I951 Orthostatic hypotension: Secondary | ICD-10-CM | POA: Diagnosis not present

## 2017-12-21 DIAGNOSIS — Z5111 Encounter for antineoplastic chemotherapy: Secondary | ICD-10-CM | POA: Diagnosis not present

## 2017-12-21 DIAGNOSIS — Z7189 Other specified counseling: Secondary | ICD-10-CM

## 2017-12-21 DIAGNOSIS — I10 Essential (primary) hypertension: Secondary | ICD-10-CM | POA: Diagnosis not present

## 2017-12-21 LAB — CBC WITH DIFFERENTIAL/PLATELET
Basophils Absolute: 0.1 10*3/uL (ref 0–0.1)
Basophils Relative: 1 %
Eosinophils Absolute: 0.2 10*3/uL (ref 0–0.7)
Eosinophils Relative: 3 %
HCT: 29 % — ABNORMAL LOW (ref 40.0–52.0)
Hemoglobin: 10 g/dL — ABNORMAL LOW (ref 13.0–18.0)
Lymphocytes Relative: 10 %
Lymphs Abs: 0.8 10*3/uL — ABNORMAL LOW (ref 1.0–3.6)
MCH: 30 pg (ref 26.0–34.0)
MCHC: 34.5 g/dL (ref 32.0–36.0)
MCV: 86.9 fL (ref 80.0–100.0)
Monocytes Absolute: 0.9 10*3/uL (ref 0.2–1.0)
Monocytes Relative: 12 %
Neutro Abs: 5.9 10*3/uL (ref 1.4–6.5)
Neutrophils Relative %: 74 %
Platelets: 306 10*3/uL (ref 150–440)
RBC: 3.33 MIL/uL — ABNORMAL LOW (ref 4.40–5.90)
RDW: 13.7 % (ref 11.5–14.5)
WBC: 8 10*3/uL (ref 3.8–10.6)

## 2017-12-21 LAB — COMPREHENSIVE METABOLIC PANEL
ALT: 19 U/L (ref 17–63)
AST: 18 U/L (ref 15–41)
Albumin: 3.1 g/dL — ABNORMAL LOW (ref 3.5–5.0)
Alkaline Phosphatase: 77 U/L (ref 38–126)
Anion gap: 9 (ref 5–15)
BUN: 11 mg/dL (ref 6–20)
CO2: 25 mmol/L (ref 22–32)
Calcium: 9.1 mg/dL (ref 8.9–10.3)
Chloride: 98 mmol/L — ABNORMAL LOW (ref 101–111)
Creatinine, Ser: 0.63 mg/dL (ref 0.61–1.24)
GFR calc Af Amer: >60 mL/min (ref >60)
GFR calc non Af Amer: >60 mL/min (ref >60)
Glucose, Bld: 201 mg/dL — ABNORMAL HIGH (ref 65–99)
Potassium: 4 mmol/L (ref 3.5–5.1)
Sodium: 132 mmol/L — ABNORMAL LOW (ref 135–145)
Total Bilirubin: 0.9 mg/dL (ref 0.3–1.2)
Total Protein: 6.6 g/dL (ref 6.5–8.1)

## 2017-12-21 LAB — MAGNESIUM: Magnesium: 1.5 mg/dL — ABNORMAL LOW (ref 1.7–2.4)

## 2017-12-21 MED ORDER — SODIUM CHLORIDE 0.9 % IV SOLN: 10.0000 mg | Freq: Once | INTRAVENOUS | Status: DC

## 2017-12-21 MED ORDER — SODIUM CHLORIDE 0.9 % IV SOLN
1600.0000 mg | Freq: Once | INTRAVENOUS | Status: AC
  Administered 2017-12-21: 1600 mg via INTRAVENOUS
  Filled 2017-12-21: qty 26.3

## 2017-12-21 MED ORDER — HEPARIN SOD (PORK) LOCK FLUSH 100 UNIT/ML IV SOLN
500.0000 [IU] | Freq: Once | INTRAVENOUS | Status: AC
  Administered 2017-12-21: 500 [IU] via INTRAVENOUS

## 2017-12-21 MED ORDER — ONDANSETRON HCL 8 MG PO TABS: 8.0000 mg | ORAL_TABLET | Freq: Three times a day (TID) | ORAL | 0 refills | Status: DC | PRN

## 2017-12-21 MED ORDER — CARBOPLATIN CHEMO INJECTION 450 MG/45ML
390.0000 mg | Freq: Once | INTRAVENOUS | Status: AC
  Administered 2017-12-21: 390 mg via INTRAVENOUS
  Filled 2017-12-21: qty 39

## 2017-12-21 MED ORDER — PALONOSETRON HCL INJECTION 0.25 MG/5ML
0.2500 mg | Freq: Once | INTRAVENOUS | Status: AC
  Administered 2017-12-21: 0.25 mg via INTRAVENOUS
  Filled 2017-12-21: qty 5

## 2017-12-21 MED ORDER — SODIUM CHLORIDE 0.9 % IV SOLN
1.0000 g | Freq: Once | INTRAVENOUS | Status: AC
  Administered 2017-12-21: 1 g via INTRAVENOUS
  Filled 2017-12-21: qty 2

## 2017-12-21 MED ORDER — DEXAMETHASONE SODIUM PHOSPHATE 10 MG/ML IJ SOLN
10.0000 mg | Freq: Once | INTRAMUSCULAR | Status: AC
  Administered 2017-12-21: 10 mg via INTRAVENOUS
  Filled 2017-12-21: qty 1

## 2017-12-21 MED ORDER — SODIUM CHLORIDE 0.9 % IV SOLN
Freq: Once | INTRAVENOUS | Status: AC
  Administered 2017-12-21: 12:00:00 via INTRAVENOUS
  Filled 2017-12-21: qty 1000

## 2017-12-21 NOTE — Patient Instructions (Addendum)
Need to be taking: 1. SENNA - S per package instructions.  2. Magnesium oxide (Mag-ox) 400 mg daily.

## 2017-12-21 NOTE — Progress Notes (Signed)
[facility] day:  12/21/2017   Chief Complaint: Patrick Mcgee is a 72 y.o. male with metastatic urothelial carcinoma who is seen for assessment prior to cycle #1 carboplatin and gemcitabine.  HPI:  Patrick Mcgee patient was last seen in the medical oncology clinic on 12/13/2017.  At that time, his abdominal pain appeared better controlled.  He had a small amount of hematuria s/p stent placement. Exam was stable. We discussed chemotherapy plans.  He was admitted to [facility] from 12/17/2017 to 12/19/2017 with hyponatremia and abdominal pain.  He was seen by palliative care.  He received IVF and salt tablets.  He was placed on a bowel regimen for constipation.  Pain medications were adjusted.  He was discharged on OxyContin 15 mg po q 12 hours and Dilaudid 2 mg po q 4 hours prn pain. He was given Linzess for OIC.   Port-a-cath was placed by Dr. Lucky Cowboy on 12/19/2017. Patient's wife attended chemotherapy class on 12/20/2017. Patient was sent to the clinic for IVFs.  He was seen by Faythe Casa on 12/20/2017 for somnolence and hypotension. Patient received IVFs and notes that he felt "better".   During the interim, patient is doing "ok". He continues to struggle with pain and constipation. He states, "the pain gets so bad that I feel like I am going crazy". Patient notes that he feels better today as compared to yesterday. Patient denies fevers and recent infections. He is not having nausea or vomiting.   Pain is rated 6/10 in the clinic.    Past Medical History:  Diagnosis Date  . Acid reflux   . Anxiety   . Cancer ([place])    renal mass  . Depression   . Diabetes mellitus without complication ([place])   . Hyperlipidemia   . Hypertension   . Myocardial infarction ([place]) 1995  . Sleep apnea     Past Surgical History:  Procedure Laterality Date  . CATARACT EXTRACTION Left   . COLONOSCOPY  2010   [facility]  . COLONOSCOPY WITH PROPOFOL N/A 10/04/2016   Procedure:  COLONOSCOPY WITH PROPOFOL;  Surgeon: [name], MD;  Location: [facility] ENDOSCOPY;  Service: Endoscopy;  Laterality: N/A;  . [place], 2000, 2001, 2014  . CYSTOSCOPY W/ RETROGRADES Bilateral 12/10/2017   Procedure: CYSTOSCOPY WITH RETROGRADE PYELOGRAM;  Surgeon: [name], MD;  Location: [facility] ORS;  Service: Urology;  Laterality: Bilateral;  . CYSTOSCOPY WITH STENT PLACEMENT Left 12/10/2017   Procedure: CYSTOSCOPY WITH STENT PLACEMENT;  Surgeon: [name], MD;  Location: [facility] ORS;  Service: Urology;  Laterality: Left;  . INGUINAL HERNIA REPAIR Right 08/09/2015   Procedure: HERNIA REPAIR INGUINAL ADULT;  Surgeon: [name], MD;  Location: [facility] ORS;  Service: General;  Laterality: Right;  . NASAL SINUS SURGERY    . nuclear stress test    . PORTA CATH INSERTION N/A 12/19/2017   Procedure: PORTA CATH INSERTION;  Surgeon: [name], MD;  Location: [facility] CV LAB;  Service: Cardiovascular;  Laterality: N/A;  . URETERAL BIOPSY Left 12/10/2017   Procedure: URETERAL & renal PELVIS BIOPSY;  Surgeon: [name], MD;  Location: [facility] ORS;  Service: Urology;  Laterality: Left;  . URETEROSCOPY Left 12/10/2017   Procedure: URETEROSCOPY;  Surgeon: [name], MD;  Location: [facility] ORS;  Service: Urology;  Laterality: Left;    Family History  Problem Relation Age of Onset  . Heart disease Mother   . Cancer Father  Lung and colon cancer  . Heart disease Father   . Emphysema Maternal Grandfather   . Tuberculosis Maternal Grandmother     Social History:  reports that he has never smoked. He has quit using smokeless tobacco. His smokeless tobacco use included chew. He reports that he drinks about 0.6 - 3.0 oz of alcohol per week. He reports that he does not use drugs.  Patient is retired from the [employer]. Patient denies known exposures to radiation on toxins. The patient is accompanied by his wife, [name], and daughter  today.  Allergies:  Allergies  Allergen Reactions  . Sulfa Antibiotics Other (See Comments)    Joint pain  . Ace Inhibitors Cough  . Invokana [Canagliflozin] Other (See Comments)    Leg pain  . Penicillins Rash    Has patient had a PCN reaction causing immediate rash, facial/tongue/throat swelling, SOB or lightheadedness with hypotension: Yes Has patient had a PCN reaction causing severe rash involving mucus membranes or skin necrosis: No Has patient had a PCN reaction that required hospitalization: No Has patient had a PCN reaction occurring within the last 10 years: No If all of the above answers are "NO", then may proceed with Cephalosporin use.    Current Medications: Current Outpatient Medications  Medication Sig Dispense Refill  . acetaminophen (TYLENOL) 500 MG tablet Take 500 mg by mouth every 6 (six) hours as needed for moderate pain. When taking oxycodone 5 mg tablets    . ALPRAZolam (XANAX) 0.25 MG tablet Take 1 tablet (0.25 mg total) by mouth 3 (three) times daily as needed for anxiety. 20 tablet 0  . aspirin EC 81 MG tablet Take 81 mg by mouth at bedtime.    Marland Kitchen atorvastatin (LIPITOR) 40 MG tablet TAKE ONE TABLET BY MOUTH ONCE DAILY (Patient taking differently: TAKE ONE TABLET BY MOUTH DAILY AT BEDTIME) 90 tablet 3  . carvedilol (COREG) 12.5 MG tablet TAKE ONE TABLET BY MOUTH TWICE DAILY 180 tablet 3  . clobetasol cream (TEMOVATE) 5.42 % Apply 1 application topically as needed (for skin rash).     . feeding supplement, ENSURE ENLIVE, (ENSURE ENLIVE) LIQD Take 237 mLs by mouth 3 (three) times daily between meals. 90 Bottle 0  . fluticasone (FLONASE) 50 MCG/ACT nasal spray Place 2 sprays into both nostrils daily. (Patient taking differently: Place 2 sprays into both nostrils daily as needed for allergies or rhinitis. ) 16 g 12  . glucose blood (CONTOUR NEXT TEST) test strip Checks sugar 4 times daily. DX E11.9-strips for Contour next EZ meter 360 each 3  . HYDROmorphone  (DILAUDID) 2 MG tablet Take 1 tablet (2 mg total) by mouth every 4 (four) hours as needed for severe pain (breakthrough pain). 40 tablet 0  . Ivermectin (SOOLANTRA) 1 % CREA Apply topically at bedtime. To face    . lactulose (CEPHULAC) 20 g packet Take 1 packet (20 g total) by mouth 2 (two) times daily as needed (constipation). 60 each 0  . lidocaine-prilocaine (EMLA) cream Apply 1 application topically as needed. 30 g 3  . linaclotide (LINZESS) 290 MCG CAPS capsule Take 1 capsule (290 mcg total) by mouth daily before breakfast. 30 capsule 0  . losartan (COZAAR) 100 MG tablet TAKE ONE TABLET BY MOUTH ONCE DAILY (Patient taking differently: TAKE ONE TABLET BY MOUTH DAILY AT BEDTIME) 90 tablet 3  . meclizine (ANTIVERT) 25 MG tablet Take 25 mg by mouth 3 (three) times daily as needed for dizziness.    . metFORMIN (GLUCOPHAGE) 1000  MG tablet Take 1 tablet (1,000 mg total) by mouth 2 (two) times daily with a meal. 180 tablet 3  . nystatin cream (MYCOSTATIN) APPLY  CREAM TOPICALLY TWICE DAILY AROUND  THE  GROIN  AREA (Patient taking differently: APPLY  CREAM TOPICALLY TWICE DAILY AROUND  THE  GROIN  AREA AS NEEDED FOR SKIN RASH) 15 g 1  . oxyCODONE (OXYCONTIN) 15 mg 12 hr tablet Take 1 tablet (15 mg total) by mouth every 12 (twelve) hours. 20 tablet 0  . polyethylene glycol (MIRALAX) packet Take 17 g by mouth 2 (two) times daily. (Patient taking differently: Take 17 g by mouth 2 (two) times daily as needed for moderate constipation. ) 14 each 0  . tamsulosin (FLOMAX) 0.4 MG CAPS capsule Take 1 capsule (0.4 mg total) by mouth daily. 30 capsule 0  . amoxicillin (AMOXIL) 500 MG capsule Take 2,000 mg by mouth once. 1 hour prior to dental appointment    . sodium chloride 1 g tablet Take 1 tablet (1 g total) by mouth 3 (three) times daily with meals. (Patient not taking: Reported on 12/20/2017) 15 tablet 0   No current facility-administered medications for this visit.     Review of Systems  Constitutional:  Positive for malaise/fatigue. Negative for fever and weight loss.       Feels "ok".  HENT: Negative.  Negative for congestion, ear discharge, nosebleeds and sore throat.   Eyes: Negative.  Negative for blurred vision, double vision, pain and redness.  Respiratory: Negative for cough, hemoptysis, sputum production, shortness of breath and stridor.   Cardiovascular: Negative.  Negative for chest pain, palpitations, orthopnea and leg swelling.       MI s/p stent placement x 5 or 6 (last 2015).   Gastrointestinal: Positive for constipation. Negative for abdominal pain, blood in stool, diarrhea, melena, nausea and vomiting.       6 out of 10 abdominal pain.  Genitourinary: Positive for hematuria (small amount post stent placement). Negative for dysuria, frequency and urgency.  Musculoskeletal: Negative for back pain and myalgias.  Skin: Negative.  Negative for itching and rash.  Neurological: Positive for tingling (diabetic neuropathy in feet) and weakness (generalized). Negative for dizziness, tremors, sensory change, focal weakness and headaches.  Endo/Heme/Allergies: Positive for environmental allergies (seasonal allergies - "all of my life"). Does not bruise/bleed easily.       Type II diabetes mellitus  Psychiatric/Behavioral: Negative for depression, memory loss and suicidal ideas. The patient is not nervous/anxious and does not have insomnia.   All other systems reviewed and are negative.  Performance status (ECOG): 2 - Symptomatic, <50% confined to bed  Physical Exam: Blood pressure (!) 165/79, pulse 73, temperature 97.6 F (36.4 C), resp. rate 16, weight 176 lb (79.8 kg). GENERAL:  Slightly fatigued appearing gentleman sitting comfortably in the exam room in no acute distress. MENTAL STATUS:  Alert and oriented to person, place and time. HEAD:  Thin white hair.  Normocephalic, atraumatic, face symmetric, no Cushingoid features. EYES:  Brown eyes.  Pupils equal round and reactive to  light and accomodation.  No conjunctivitis or scleral icterus. ENT:  Oropharynx clear without lesion.  Tongue normal. Mucous membranes moist.  RESPIRATORY:  Clear to auscultation without rales, wheezes or rhonchi. CARDIOVASCULAR:  Regular rate and rhythm without murmur, rub or gallop. ABDOMEN:  Soft, slightly tender, with active bowel sounds, and no hepatosplenomegaly.  No guarding or rebound tenderness.  No masses. SKIN:  No rashes, ulcers or lesions. EXTREMITIES: No edema, no  skin discoloration or tenderness.  No palpable cords. LYMPH NODES: No palpable cervical, supraclavicular, axillary or inguinal adenopathy  NEUROLOGICAL: Unremarkable. PSYCH:  Appropriate.    Appointment on 12/21/2017  Component Date Value Ref Range Status  . Sodium 12/21/2017 132* 135 - 145 mmol/L Final  . Potassium 12/21/2017 4.0  3.5 - 5.1 mmol/L Final  . Chloride 12/21/2017 98* 101 - 111 mmol/L Final  . CO2 12/21/2017 25  22 - 32 mmol/L Final  . Glucose, Bld 12/21/2017 201* 65 - 99 mg/dL Final  . BUN 12/21/2017 11  6 - 20 mg/dL Final  . Creatinine, Ser 12/21/2017 0.63  0.61 - 1.24 mg/dL Final  . Calcium 12/21/2017 9.1  8.9 - 10.3 mg/dL Final  . Total Protein 12/21/2017 6.6  6.5 - 8.1 g/dL Final  . Albumin 12/21/2017 3.1* 3.5 - 5.0 g/dL Final  . AST 12/21/2017 18  15 - 41 U/L Final  . ALT 12/21/2017 19  17 - 63 U/L Final  . Alkaline Phosphatase 12/21/2017 77  38 - 126 U/L Final  . Total Bilirubin 12/21/2017 0.9  0.3 - 1.2 mg/dL Final  . GFR calc non Af Amer 12/21/2017 >60  >60 mL/min Final  . GFR calc Af Amer 12/21/2017 >60  >60 mL/min Final   Comment: (NOTE) The eGFR has been calculated using the CKD EPI equation. This calculation has not been validated in all clinical situations. eGFR's persistently <60 mL/min signify possible Chronic Kidney Disease.   Georgiann Hahn gap 12/21/2017 9  5 - 15 Final   Performed at [facility], [address] [place], [place] [address]  . Magnesium 12/21/2017 1.5* 1.7  - 2.4 mg/dL Final   Performed at [facility], [address] [place], [place] [address]  . WBC 12/21/2017 8.0  3.8 - 10.6 K/uL Final  . RBC 12/21/2017 3.33* 4.40 - 5.90 MIL/uL Final  . Hemoglobin 12/21/2017 10.0* 13.0 - 18.0 g/dL Final  . HCT 12/21/2017 29.0* 40.0 - 52.0 % Final  . MCV 12/21/2017 86.9  80.0 - 100.0 fL Final  . MCH 12/21/2017 30.0  26.0 - 34.0 pg Final  . MCHC 12/21/2017 34.5  32.0 - 36.0 g/dL Final  . RDW 12/21/2017 13.7  11.5 - 14.5 % Final  . Platelets 12/21/2017 306  150 - 440 K/uL Final  . Neutrophils Relative % 12/21/2017 74  % Final  . Neutro Abs 12/21/2017 5.9  1.4 - 6.5 K/uL Final  . Lymphocytes Relative 12/21/2017 10  % Final  . Lymphs Abs 12/21/2017 0.8* 1.0 - 3.6 K/uL Final  . Monocytes Relative 12/21/2017 12  % Final  . Monocytes Absolute 12/21/2017 0.9  0.2 - 1.0 K/uL Final  . Eosinophils Relative 12/21/2017 3  % Final  . Eosinophils Absolute 12/21/2017 0.2  0 - 0.7 K/uL Final  . Basophils Relative 12/21/2017 1  % Final  . Basophils Absolute 12/21/2017 0.1  0 - 0.1 K/uL Final   Performed at [facility], [address] [place], [place] [address]  Admission on 12/17/2017, Discharged on 12/19/2017  Component Date Value Ref Range Status  . WBC 12/17/2017 10.6  3.8 - 10.6 K/uL Final  . RBC 12/17/2017 3.95* 4.40 - 5.90 MIL/uL Final  . Hemoglobin 12/17/2017 11.9* 13.0 - 18.0 g/dL Final  . HCT 12/17/2017 34.7* 40.0 - 52.0 % Final  . MCV 12/17/2017 87.6  80.0 - 100.0 fL Final  . MCH 12/17/2017 30.1  26.0 - 34.0 pg Final  . MCHC 12/17/2017 34.4  32.0 -  36.0 g/dL Final  . RDW 12/17/2017 13.7  11.5 - 14.5 % Final  . Platelets 12/17/2017 308  150 - 440 K/uL Final  . Neutrophils Relative % 12/17/2017 78  % Final  . Neutro Abs 12/17/2017 8.4* 1.4 - 6.5 K/uL Final  . Lymphocytes Relative 12/17/2017 9  % Final  . Lymphs Abs 12/17/2017 0.9* 1.0 - 3.6 K/uL Final  . Monocytes Relative 12/17/2017 10  % Final  . Monocytes Absolute 12/17/2017 1.0  0.2 -  1.0 K/uL Final  . Eosinophils Relative 12/17/2017 2  % Final  . Eosinophils Absolute 12/17/2017 0.2  0 - 0.7 K/uL Final  . Basophils Relative 12/17/2017 1  % Final  . Basophils Absolute 12/17/2017 0.1  0 - 0.1 K/uL Final   Performed at [facility], [address], [place], [place] [address]  . Sodium 12/17/2017 128* 135 - 145 mmol/L Final  . Potassium 12/17/2017 4.1  3.5 - 5.1 mmol/L Final  . Chloride 12/17/2017 90* 101 - 111 mmol/L Final  . CO2 12/17/2017 27  22 - 32 mmol/L Final  . Glucose, Bld 12/17/2017 185* 65 - 99 mg/dL Final  . BUN 12/17/2017 15  6 - 20 mg/dL Final  . Creatinine, Ser 12/17/2017 0.70  0.61 - 1.24 mg/dL Final  . Calcium 12/17/2017 9.7  8.9 - 10.3 mg/dL Final  . Total Protein 12/17/2017 7.8  6.5 - 8.1 g/dL Final  . Albumin 12/17/2017 3.7  3.5 - 5.0 g/dL Final  . AST 12/17/2017 34  15 - 41 U/L Final  . ALT 12/17/2017 34  17 - 63 U/L Final  . Alkaline Phosphatase 12/17/2017 86  38 - 126 U/L Final  . Total Bilirubin 12/17/2017 1.2  0.3 - 1.2 mg/dL Final  . GFR calc non Af Amer 12/17/2017 >60  >60 mL/min Final  . GFR calc Af Amer 12/17/2017 >60  >60 mL/min Final   Comment: (NOTE) The eGFR has been calculated using the CKD EPI equation. This calculation has not been validated in all clinical situations. eGFR's persistently <60 mL/min signify possible Chronic Kidney Disease.   Georgiann Hahn gap 12/17/2017 11  5 - 15 Final   Performed at [facility], [address] [place], [place] [address]  . Lipase 12/17/2017 17  11 - 51 U/L Final   Performed at [facility], [address] [place], [place] [address]  . Troponin I 12/17/2017 <0.03  <0.03 ng/mL Final   Performed at [facility], [address] [place],  [address]  . Glucose-Capillary 12/17/2017 143* 65 - 99 mg/dL Final  . Glucose-Capillary 12/17/2017 123* 65 - 99 mg/dL Final  . Glucose-Capillary 12/17/2017 136* 65 - 99 mg/dL Final  . Sodium 12/18/2017 131* 135 - 145  mmol/L Final  . Potassium 12/18/2017 4.0  3.5 - 5.1 mmol/L Final  . Chloride 12/18/2017 96* 101 - 111 mmol/L Final  . CO2 12/18/2017 28  22 - 32 mmol/L Final  . Glucose, Bld 12/18/2017 188* 65 - 99 mg/dL Final  . BUN 12/18/2017 9  6 - 20 mg/dL Final  . Creatinine, Ser 12/18/2017 0.68  0.61 - 1.24 mg/dL Final  . Calcium 12/18/2017 9.5  8.9 - 10.3 mg/dL Final  . GFR calc non Af Amer 12/18/2017 >60  >60 mL/min Final  . GFR calc Af Amer 12/18/2017 >60  >60 mL/min Final   Comment: (NOTE) The eGFR has been calculated using the CKD EPI equation. This calculation has not been validated in all clinical situations.  eGFR's persistently <60 mL/min signify possible Chronic Kidney Disease.   Georgiann Hahn gap 12/18/2017 7  5 - 15 Final   Performed at [facility], [address] [place], [place] [address]  . Glucose-Capillary 12/17/2017 131* 65 - 99 mg/dL Final  . Glucose-Capillary 12/18/2017 164* 65 - 99 mg/dL Final  . Glucose-Capillary 12/18/2017 167* 65 - 99 mg/dL Final  . Glucose-Capillary 12/18/2017 166* 65 - 99 mg/dL Final  . Sodium 12/19/2017 130* 135 - 145 mmol/L Final  . Potassium 12/19/2017 4.1  3.5 - 5.1 mmol/L Final  . Chloride 12/19/2017 96* 101 - 111 mmol/L Final  . CO2 12/19/2017 25  22 - 32 mmol/L Final  . Glucose, Bld 12/19/2017 131* 65 - 99 mg/dL Final  . BUN 12/19/2017 8  6 - 20 mg/dL Final  . Creatinine, Ser 12/19/2017 0.69  0.61 - 1.24 mg/dL Final  . Calcium 12/19/2017 9.1  8.9 - 10.3 mg/dL Final  . GFR calc non Af Amer 12/19/2017 >60  >60 mL/min Final  . GFR calc Af Amer 12/19/2017 >60  >60 mL/min Final   Comment: (NOTE) The eGFR has been calculated using the CKD EPI equation. This calculation has not been validated in all clinical situations. eGFR's persistently <60 mL/min signify possible Chronic Kidney Disease.   Georgiann Hahn gap 12/19/2017 9  5 - 15 Final   Performed at [facility], [address] [place], [place] [address]  . Hemoglobin 12/18/2017  10.7* 13.0 - 18.0 g/dL Final  . HCT 12/18/2017 31.2* 40.0 - 52.0 % Final   Performed at [facility], [address] [place], [place] [address]  . Hemoglobin 12/19/2017 10.8* 13.0 - 18.0 g/dL Final  . HCT 12/19/2017 31.8* 40.0 - 52.0 % Final   Performed at [facility], [address] [place], [place] [address]  . Glucose-Capillary 12/18/2017 94  65 - 99 mg/dL Final  . Hemoglobin 12/19/2017 10.5* 13.0 - 18.0 g/dL Final  . HCT 12/19/2017 30.3* 40.0 - 52.0 % Final   Performed at [facility], [address] [place], [place] [address]  . Glucose-Capillary 12/19/2017 115* 65 - 99 mg/dL Final  . Magnesium 12/19/2017 1.5* 1.7 - 2.4 mg/dL Final   Performed at [facility], [address] [place],  [address]  . Glucose-Capillary 12/19/2017 147* 65 - 99 mg/dL Final    Assessment:  Patrick Mcgee is a 72 y.o. 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  He presented with abdominal pain and weight loss.  Abdomen and pelvic CT on 12/01/2017 revealed a suspicious enhancing, filling defect involving the lower pole collecting system of the left kidney with asymmetric hypoenhancement of the inferior pole cortex of left kidney noted. Findings were worrisome for urothelial neoplasm.  There was abdominal and pelvic adenopathy compatible with nodal metastasis.  There was a 2.6 cm right para celiac node, 1,6 cm left retroperitoneal node, 1.4 cm right retroperitoneal node, 2.1 cm right common iliac node and 1 cm left external iliac node.  There was aortic atherosclerosis, kidney cysts, and prostate gland enlargement.  Chest CT on 12/07/2017 revealed no evidence of metastatic disease.    He underwent left ureteroscopy with renal pelvis biopsy and left ureteral stent placement on 12/10/2017.  Findings included a large nodular, vascular tumor within the left lower pole collecting system completely obstructing the entire  left lower pole moiety.  Ureter, right kidney, and bladder was normal.  Pathology revealed small fragments  of high-grade urothelial carcinoma with a small focus of invasion.  He has diabetes and coronary artery disease s/p MI in 1995.  He is s/p stent plaecment x 5-6.  Colonoscopy in 2018 was negative.  Symptomatically, he notes abdominal pain and constipation. Exam is stable. Magnesium is 1.5.  Plan: 1. Labs today:  CBC with diff, CMP, Mg. 2. Discuss plan for a platinum based regimen carboplatin with gemcitabine. Side effects reviewed in detail.  Discuss starting with 80% gemcitabine dose. Patient consents to beginning treatment today.  3. Labs reviewed. Blood counts stable and adequate enough for treatment. Will proceed with cycle #1 carboplatin + gemcitabine.  4. Discuss low magnesium. Magnesium 1.5 today. Will replace with 1 gram intravenous magnesium today. Patient to start oral Mag-ox 400 mg daily.  5. Awaiting results of ancillary testing (FGFR1 or FGFR3 genetic alterations for use of [place] (erdafitinib)at [facility]. 6. Discuss opioid induced constipation. Patient is on Linzess. He was educated on additional bowel regimen.  7. RTC on 12/31/2017 for MD assessment, labs (CBC with diff, CMP, Mg), and day 8 of cycle #1 gemcitabine.   Honor Loh, NP  12/21/17, 10:44 AM  I saw and evaluated the patient, participating in the key portions of the service and reviewing pertinent diagnostic studies and records.  I reviewed the nurse practitioner's note and agree with the findings and the plan.  The assessment and plan were discussed with the patient.  Multiple questions were asked by the patient and answered.   Nolon Stalls, MD 12/21/2017,10:23 AM

## 2017-12-24 ENCOUNTER — Ambulatory Visit (INDEPENDENT_AMBULATORY_CARE_PROVIDER_SITE_OTHER): Payer: Medicare Other | Admitting: Family Medicine

## 2017-12-24 DIAGNOSIS — I2510 Atherosclerotic heart disease of native coronary artery without angina pectoris: Secondary | ICD-10-CM | POA: Diagnosis not present

## 2017-12-24 DIAGNOSIS — I10 Essential (primary) hypertension: Secondary | ICD-10-CM

## 2017-12-24 DIAGNOSIS — C642 Malignant neoplasm of left kidney, except renal pelvis: Secondary | ICD-10-CM | POA: Diagnosis not present

## 2017-12-24 DIAGNOSIS — G4733 Obstructive sleep apnea (adult) (pediatric): Secondary | ICD-10-CM

## 2017-12-24 DIAGNOSIS — E119 Type 2 diabetes mellitus without complications: Secondary | ICD-10-CM | POA: Diagnosis not present

## 2017-12-24 DIAGNOSIS — E440 Moderate protein-calorie malnutrition: Secondary | ICD-10-CM

## 2017-12-24 MED ORDER — FENTANYL 25 MCG/HR TD PT72: 25.0000 ug | MEDICATED_PATCH | TRANSDERMAL | 0 refills | Status: DC

## 2017-12-24 NOTE — Patient Instructions (Signed)
Mild of Magnesia: take 1 dose every 12 hours up to 3 doses or until good bowel movement.  Linzess: use every other day

## 2017-12-24 NOTE — Progress Notes (Signed)
Patrick Mcgee  MRN: [id number] DOB: [birth year]  Subjective:  HPI   The patient is a 72 year old male who presents for follow up of recent hospitalization.  The patient presented to the ER for generalized abdominal pain on 12/17/17.  He was also having significant constipation.  He was admitted for pain management, hyponatremia,  and constipation. Transition of care visit. Constipation-Last BM was on 12/22/17.  The patient is using Miralax twice daily, Linzess daily and Senokot as needed.  He has not been using the Senokot until today.  Patient Active Problem List   Diagnosis Date Noted  . Hypomagnesemia 12/21/2017  . Encounter for antineoplastic chemotherapy 12/21/2017  . Malignant neoplasm of kidney ([place])   . Urothelial cancer ([place]) 12/18/2017  . Malnutrition of moderate degree 12/18/2017  . Abdominal pain   . Therapeutic opioid induced constipation   . Palliative care encounter   . Cancer associated pain 12/17/2017  . Dehydration 12/17/2017  . Goals of care, counseling/discussion 12/13/2017  . 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 Right inguinal hernia 07/17/2015  . Family history of colon cancer 07/17/2015  . Allergic rhinitis 11/12/2014  . Airway hyperreactivity 11/12/2014  . Atherosclerosis of coronary artery 11/12/2014  . Cheilitis 11/12/2014  . Narrowing of intervertebral disc space 11/12/2014  . Deflected nasal septum 11/12/2014  . HLD (hyperlipidemia) 11/12/2014  . BP (high blood pressure) 11/12/2014  . Adult hypothyroidism 11/12/2014  . Adiposity 11/12/2014  . Sleep apnea 11/12/2014  . Diabetes mellitus, type 2 ([place]) 11/12/2014  . Arteriosclerosis of coronary artery 10/02/2012  . Degenerative disc disease, lumbar 09/24/2012  . Furunculosis 04/23/2012    Past Medical History:  Diagnosis Date  . Acid reflux   . Anxiety   . Cancer ([place])    renal mass  . Depression   . Diabetes mellitus without complication ([place])   . Hyperlipidemia   .  Hypertension   . Myocardial infarction ([place]) 1995  . Sleep apnea     Social History   Socioeconomic History  . Marital status: Married    Spouse name: Not on file  . Number of children: Not on file  . Years of education: Not on file  . Highest education level: Not on file  Occupational History  . Occupation: retired  Scientific laboratory technician  . Financial resource strain: Not on file  . Food insecurity:    Worry: Not on file    Inability: Not on file  . Transportation needs:    Medical: Not on file    Non-medical: Not on file  Tobacco Use  . Smoking status: Never Smoker  . Smokeless tobacco: Former Systems developer    Types: Chew  Substance and Sexual Activity  . Alcohol use: Yes    Alcohol/week: 0.6 - 3.0 oz    Types: 1 Standard drinks or equivalent per week    Comment: rarely  . Drug use: No  . Sexual activity: Not Currently  Lifestyle  . Physical activity:    Days per week: Not on file    Minutes per session: Not on file  . Stress: Not on file  Relationships  . Social connections:    Talks on phone: Not on file    Gets together: Not on file    Attends religious service: Not on file    Active member of club or organization: Not on file    Attends meetings of clubs or organizations: Not on file    Relationship status: Not on file  .  Intimate partner violence:    Fear of current or ex partner: Not on file    Emotionally abused: Not on file    Physically abused: Not on file    Forced sexual activity: Not on file  Other Topics Concern  . Not on file  Social History Narrative  . Not on file    Outpatient Encounter Medications as of 12/24/2017  Medication Sig  . acetaminophen (TYLENOL) 500 MG tablet Take 500 mg by mouth every 6 (six) hours as needed for moderate pain. When taking oxycodone 5 mg tablets  . ALPRAZolam (XANAX) 0.25 MG tablet Take 1 tablet (0.25 mg total) by mouth 3 (three) times daily as needed for anxiety.  Marland Kitchen atorvastatin (LIPITOR) 40 MG tablet TAKE ONE TABLET BY MOUTH  ONCE DAILY (Patient taking differently: TAKE ONE TABLET BY MOUTH DAILY AT BEDTIME)  . carvedilol (COREG) 12.5 MG tablet TAKE ONE TABLET BY MOUTH TWICE DAILY  . clobetasol cream (TEMOVATE) 4.58 % Apply 1 application topically as needed (for skin rash).   . feeding supplement, ENSURE ENLIVE, (ENSURE ENLIVE) LIQD Take 237 mLs by mouth 3 (three) times daily between meals.  . fluticasone (FLONASE) 50 MCG/ACT nasal spray Place 2 sprays into both nostrils daily. (Patient taking differently: Place 2 sprays into both nostrils daily as needed for allergies or rhinitis. )  . glucose blood (CONTOUR NEXT TEST) test strip Checks sugar 4 times daily. DX E11.9-strips for Contour next EZ meter  . HYDROmorphone (DILAUDID) 2 MG tablet Take 1 tablet (2 mg total) by mouth every 4 (four) hours as needed for severe pain (breakthrough pain).  . Ivermectin (SOOLANTRA) 1 % CREA Apply topically at bedtime. To face  . lidocaine-prilocaine (EMLA) cream Apply 1 application topically as needed.  . linaclotide (LINZESS) 290 MCG CAPS capsule Take 1 capsule (290 mcg total) by mouth daily before breakfast.  . losartan (COZAAR) 100 MG tablet TAKE ONE TABLET BY MOUTH ONCE DAILY (Patient taking differently: TAKE ONE TABLET BY MOUTH DAILY AT BEDTIME)  . meclizine (ANTIVERT) 25 MG tablet Take 25 mg by mouth 3 (three) times daily as needed for dizziness.  . metFORMIN (GLUCOPHAGE) 1000 MG tablet Take 1 tablet (1,000 mg total) by mouth 2 (two) times daily with a meal.  . nystatin cream (MYCOSTATIN) APPLY  CREAM TOPICALLY TWICE DAILY AROUND  THE  GROIN  AREA (Patient taking differently: APPLY  CREAM TOPICALLY TWICE DAILY AROUND  THE  GROIN  AREA AS NEEDED FOR SKIN RASH)  . ondansetron (ZOFRAN) 8 MG tablet Take 1 tablet (8 mg total) by mouth every 8 (eight) hours as needed for nausea or vomiting (if needed beginning 3 days after chemotherapy).  Marland Kitchen oxyCODONE (OXYCONTIN) 15 mg 12 hr tablet Take 1 tablet (15 mg total) by mouth every 12 (twelve)  hours.  . polyethylene glycol (MIRALAX) packet Take 17 g by mouth 2 (two) times daily. (Patient taking differently: Take 17 g by mouth 2 (two) times daily as needed for moderate constipation. )  . sodium chloride 1 g tablet Take 1 tablet (1 g total) by mouth 3 (three) times daily with meals.  . tamsulosin (FLOMAX) 0.4 MG CAPS capsule Take 1 capsule (0.4 mg total) by mouth daily.  Marland Kitchen lactulose (CEPHULAC) 20 g packet Take 1 packet (20 g total) by mouth 2 (two) times daily as needed (constipation). (Patient not taking: Reported on 12/24/2017)  . [DISCONTINUED] amoxicillin (AMOXIL) 500 MG capsule Take 2,000 mg by mouth once. 1 hour prior to dental appointment  . [  DISCONTINUED] aspirin EC 81 MG tablet Take 81 mg by mouth at bedtime.   No facility-administered encounter medications on file as of 12/24/2017.     Allergies  Allergen Reactions  . Sulfa Antibiotics Other (See Comments)    Joint pain  . Ace Inhibitors Cough  . Invokana [Canagliflozin] Other (See Comments)    Leg pain  . Penicillins Rash    Has patient had a PCN reaction causing immediate rash, facial/tongue/throat swelling, SOB or lightheadedness with hypotension: Yes Has patient had a PCN reaction causing severe rash involving mucus membranes or skin necrosis: No Has patient had a PCN reaction that required hospitalization: No Has patient had a PCN reaction occurring within the last 10 years: No If all of the above answers are "NO", then may proceed with Cephalosporin use.    Review of Systems  Constitutional: Positive for malaise/fatigue. Negative for fever and weight loss.  HENT: Negative.   Eyes: Negative.   Respiratory: Negative for cough, shortness of breath and wheezing.   Cardiovascular: Positive for leg swelling. Negative for chest pain, palpitations, orthopnea and claudication.  Gastrointestinal: Positive for abdominal pain and constipation. Negative for blood in stool, diarrhea, heartburn, melena, nausea and vomiting.    Skin: Negative.   Neurological: Negative.   Endo/Heme/Allergies: Negative.   Psychiatric/Behavioral: Negative.     Objective:  There were no vitals taken for this visit.  Physical Exam  Constitutional: He is oriented to person, place, and time and well-developed, well-nourished, and in no distress.  HENT:  Head: Normocephalic and atraumatic.  Right Ear: External ear normal.  Left Ear: External ear normal.  Nose: Nose normal.  Eyes: Conjunctivae are normal. No scleral icterus.  Neck: No thyromegaly present.  Cardiovascular: Normal rate, regular rhythm and normal heart sounds.  Pulmonary/Chest: Effort normal and breath sounds normal.  Abdominal: Soft.  Neurological: He is alert and oriented to person, place, and time. Gait normal. GCS score is 15.  Skin: Skin is warm and dry.  Psychiatric: Mood, memory, affect and judgment normal.    Assessment and Plan :  1. Urothelial carcinoma of kidney, left ([place]) Plan per oncology. - Amb Referral to Palliative Care 2.CAD 3.TIIDM 4.ConstipationTry Linzess every other day plus Miralax and colace. 5.Weight Loss Will follow.  I have done the exam and reviewed the chart and it is accurate to the best of my knowledge. Development worker, community has been used and  any errors in dictation or transcription are unintentional. [name] M.D. [facility]

## 2017-12-25 ENCOUNTER — Encounter: Payer: Self-pay | Admitting: Hematology and Oncology

## 2017-12-25 LAB — SURGICAL PATHOLOGY

## 2017-12-26 ENCOUNTER — Ambulatory Visit: Payer: Medicare Other | Admitting: Urology

## 2017-12-28 ENCOUNTER — Telehealth: Payer: Self-pay | Admitting: Family Medicine

## 2017-12-28 ENCOUNTER — Other Ambulatory Visit: Payer: Self-pay

## 2017-12-28 ENCOUNTER — Other Ambulatory Visit: Payer: Self-pay | Admitting: *Deleted

## 2017-12-28 DIAGNOSIS — C642 Malignant neoplasm of left kidney, except renal pelvis: Secondary | ICD-10-CM

## 2017-12-28 DIAGNOSIS — C689 Malignant neoplasm of urinary organ, unspecified: Secondary | ICD-10-CM

## 2017-12-28 NOTE — Telephone Encounter (Signed)
Wife called wanting to know if we have started a referral for palliative care for Patrick Mcgee.  She thought it was started and wanted to know the current status.  Their call back is  [phone]  Thanks teri

## 2017-12-28 NOTE — Telephone Encounter (Signed)
Order was placed today

## 2017-12-30 NOTE — Progress Notes (Signed)
[facility] day:  12/31/2017   Chief Complaint: Patrick Mcgee is a 72 y.o. male with metastatic urothelial carcinoma who is seen for assessment prior to day 8 of cycle #1 gemcitabine.  HPI:  Patrick Mcgee patient was last seen in the medical oncology clinic on 12/21/2017.  At that time, patient was doing "ok". He was struggling with constipation. Using Miralax and Linzess for presumed OIC.  Exam was stable. Sodium low at 132. Magnesium low at 1.5. He received day 1 of cycle #1 carboplatin + gemcitabine, in addition to 1 gram of intravenous magnesium replacement. He was started on Mag-ox 400 mg daily.    Patient was seen by his PCP on 12/24/2017 for a hospital follow up visit. Notes reviewed. Patient complained on continued constipation despite Miralax BID and Linzess daily. LNBM was on 12/22/2017. Patient started Senakot prior to bring seen in clinic. Colace was added daily. Linzess was changed to every other day. He was referred to outpatient palliative care.   Therascreen FGFR mutation testing for the use of Balversa returned on 12/25/2017. FFPE block submitted to [facility] was insufficient for testing. Lab contacted for alternate tissue block, however we learned that there was no alternative blocks available for ancillary testing.   In the interim, patient is doing "good". He tolerated his chemotherapy cycle well. He denies nausea and vomiting. He states, "I am doing as good as I have been in a long time". Patient notes that his pain is better controlled and his constipation has improved. Patient is on Duragesic 25 mcg/hr patches (has been using for 6 days). He is using APAP as needed for breakthrough pain. Wife states, "I don't think it is good for him to be on this strong pain medication for so long. He has been on it since May". Patient denies pain in the clinic today.  Patient has changed his bowel regimen. He is no longer taking the Linzess. He took a single  dose of MOM, which "broke the dam" and caused him to have 5 large bowel movements. He is using Miralax and Senakot daily. Patient advises that his appetite has improved. He is eating better Weight today is 162 lb 3.2 oz (73.6 kg), which compared to his last visit to the clinic, represents a 14 pound decrease.   Patient has a non-specific sub-pannicular (intertriginous) rash. This is a recurrent issue for him. He uses topical nystatin on a PRN basis.    Past Medical History:  Diagnosis Date  . Acid reflux   . Anxiety   . Cancer ([place])    renal mass  . Depression   . Diabetes mellitus without complication ([place])   . Hyperlipidemia   . Hypertension   . Myocardial infarction ([place]) 1995  . Sleep apnea     Past Surgical History:  Procedure Laterality Date  . CATARACT EXTRACTION Left   . COLONOSCOPY  2010   [facility]  . COLONOSCOPY WITH PROPOFOL N/A 10/04/2016   Procedure: COLONOSCOPY WITH PROPOFOL;  Surgeon: [name], MD;  Location: [facility] ENDOSCOPY;  Service: Endoscopy;  Laterality: N/A;  . [place], 2000, 2001, 2014  . CYSTOSCOPY W/ RETROGRADES Bilateral 12/10/2017   Procedure: CYSTOSCOPY WITH RETROGRADE PYELOGRAM;  Surgeon: [name], MD;  Location: [facility] ORS;  Service: Urology;  Laterality: Bilateral;  . CYSTOSCOPY WITH STENT PLACEMENT Left 12/10/2017   Procedure: CYSTOSCOPY WITH STENT PLACEMENT;  Surgeon: [name], MD;  Location: [facility] ORS;  Service: Urology;  Laterality: Left;  . INGUINAL HERNIA REPAIR Right 08/09/2015   Procedure: HERNIA REPAIR INGUINAL ADULT;  Surgeon: [name], MD;  Location: [facility] ORS;  Service: General;  Laterality: Right;  . NASAL SINUS SURGERY    . nuclear stress test    . PORTA CATH INSERTION N/A 12/19/2017   Procedure: PORTA CATH INSERTION;  Surgeon: [name], MD;  Location: [facility];  Service: Cardiovascular;  Laterality: N/A;  . URETERAL BIOPSY Left 12/10/2017   Procedure: URETERAL  & renal PELVIS BIOPSY;  Surgeon: [name], MD;  Location: [facility] ORS;  Service: Urology;  Laterality: Left;  . URETEROSCOPY Left 12/10/2017   Procedure: URETEROSCOPY;  Surgeon: [name], MD;  Location: [facility] ORS;  Service: Urology;  Laterality: Left;    Family History  Problem Relation Age of Onset  . Heart disease Mother   . Cancer Father        Lung and colon cancer  . Heart disease Father   . Emphysema Maternal Grandfather   . Tuberculosis Maternal Grandmother     Social History:  reports that he has never smoked. He has quit using smokeless tobacco. His smokeless tobacco use included chew. He reports that he drinks about 0.6 - 3.0 oz of alcohol per week. He reports that he does not use drugs.  Patient is retired from the [employer]. Patient denies known exposures to radiation on toxins. The patient is accompanied by his wife, Patrick Mcgee, today.  Allergies:  Allergies  Allergen Reactions  . Sulfa Antibiotics Other (See Comments)    Joint pain  . Ace Inhibitors Cough  . Invokana [Canagliflozin] Other (See Comments)    Leg pain  . Penicillins Rash    Has patient had a PCN reaction causing immediate rash, facial/tongue/throat swelling, SOB or lightheadedness with hypotension: Yes Has patient had a PCN reaction causing severe rash involving mucus membranes or skin necrosis: No Has patient had a PCN reaction that required hospitalization: No Has patient had a PCN reaction occurring within the last 10 years: No If all of the above answers are "NO", then may proceed with Cephalosporin use.    Current Medications: Current Outpatient Medications  Medication Sig Dispense Refill  . acetaminophen (TYLENOL) 500 MG tablet Take 500 mg by mouth every 6 (six) hours as needed for moderate pain. When taking oxycodone 5 mg tablets    . ALPRAZolam (XANAX) 0.25 MG tablet Take 1 tablet (0.25 mg total) by mouth 3 (three) times daily as needed for anxiety. 20 tablet 0  . atorvastatin (LIPITOR)  40 MG tablet TAKE ONE TABLET BY MOUTH ONCE DAILY (Patient taking differently: TAKE ONE TABLET BY MOUTH DAILY AT BEDTIME) 90 tablet 3  . carvedilol (COREG) 12.5 MG tablet TAKE ONE TABLET BY MOUTH TWICE DAILY 180 tablet 3  . clobetasol cream (TEMOVATE) 4.48 % Apply 1 application topically as needed (for skin rash).     . feeding supplement, ENSURE ENLIVE, (ENSURE ENLIVE) LIQD Take 237 mLs by mouth 3 (three) times daily between meals. 90 Bottle 0  . fentaNYL (DURAGESIC - DOSED MCG/HR) 25 MCG/HR patch Place 1 patch (25 mcg total) onto the skin every 3 (three) days. 10 patch 0  . fluticasone (FLONASE) 50 MCG/ACT nasal spray Place 2 sprays into both nostrils daily. (Patient taking differently: Place 2 sprays into both nostrils daily as needed for allergies or rhinitis. ) 16 g 12  . glucose blood (CONTOUR NEXT TEST) test strip Checks sugar 4 times daily. DX  E11.9-strips for Contour next EZ meter 360 each 3  . Ivermectin (SOOLANTRA) 1 % CREA Apply topically at bedtime. To face    . lidocaine-prilocaine (EMLA) cream Apply 1 application topically as needed. 30 g 3  . losartan (COZAAR) 100 MG tablet TAKE ONE TABLET BY MOUTH ONCE DAILY (Patient taking differently: TAKE ONE TABLET BY MOUTH DAILY AT BEDTIME) 90 tablet 3  . meclizine (ANTIVERT) 25 MG tablet Take 25 mg by mouth 3 (three) times daily as needed for dizziness.    . metFORMIN (GLUCOPHAGE) 1000 MG tablet Take 1 tablet (1,000 mg total) by mouth 2 (two) times daily with a meal. 180 tablet 3  . nystatin cream (MYCOSTATIN) APPLY  CREAM TOPICALLY TWICE DAILY AROUND  THE  GROIN  AREA (Patient taking differently: APPLY  CREAM TOPICALLY TWICE DAILY AROUND  THE  GROIN  AREA AS NEEDED FOR SKIN RASH) 15 g 1  . ondansetron (ZOFRAN) 8 MG tablet Take 1 tablet (8 mg total) by mouth every 8 (eight) hours as needed for nausea or vomiting (if needed beginning 3 days after chemotherapy). 20 tablet 0  . polyethylene glycol (MIRALAX) packet Take 17 g by mouth 2 (two) times  daily. (Patient taking differently: Take 17 g by mouth 2 (two) times daily as needed for moderate constipation. ) 14 each 0  . HYDROmorphone (DILAUDID) 2 MG tablet Take 1 tablet (2 mg total) by mouth every 4 (four) hours as needed for severe pain (breakthrough pain). (Patient not taking: Reported on 12/31/2017) 40 tablet 0  . lactulose (CEPHULAC) 20 g packet Take 1 packet (20 g total) by mouth 2 (two) times daily as needed (constipation). (Patient not taking: Reported on 12/24/2017) 60 each 0  . linaclotide (LINZESS) 290 MCG CAPS capsule Take 1 capsule (290 mcg total) by mouth daily before breakfast. (Patient not taking: Reported on 12/31/2017) 30 capsule 0  . oxyCODONE (OXYCONTIN) 15 mg 12 hr tablet Take 1 tablet (15 mg total) by mouth every 12 (twelve) hours. (Patient not taking: Reported on 12/31/2017) 20 tablet 0  . sodium chloride 1 g tablet Take 1 tablet (1 g total) by mouth 3 (three) times daily with meals. (Patient not taking: Reported on 12/31/2017) 15 tablet 0  . tamsulosin (FLOMAX) 0.4 MG CAPS capsule Take 1 capsule (0.4 mg total) by mouth daily. (Patient not taking: Reported on 12/31/2017) 30 capsule 0   No current facility-administered medications for this visit.    Facility-Administered Medications Ordered in Other Visits  Medication Dose Route Frequency Provider Last Rate Last Dose  . heparin lock flush 100 unit/mL  500 Units Intravenous Once Jenetta Wease C, MD      . sodium chloride flush (NS) 0.9 % injection 10 mL  10 mL Intravenous PRN [name], MD   10 mL at 12/31/17 0947    Review of Systems  Constitutional: Positive for malaise/fatigue (improved some) and weight loss (down 14 pounds). Negative for diaphoresis and fever.       "I feel better".   HENT: Negative.  Negative for ear discharge, ear pain, nosebleeds, sinus pain and sore throat.   Eyes: Negative.  Negative for blurred vision, double vision, pain and discharge.  Respiratory: Negative for cough, hemoptysis,  sputum production and shortness of breath.   Cardiovascular: Negative for chest pain, palpitations, orthopnea, leg swelling and PND.       MI s/p angioplasty with placement of 5-6 stents (last 2015). Blood pressure controlled with 3 medications.   Gastrointestinal: Positive for constipation (  improved) and heartburn. Negative for abdominal pain, blood in stool, diarrhea, melena, nausea and vomiting.  Genitourinary: Negative for dysuria, frequency, hematuria and urgency.  Musculoskeletal: Negative for back pain, falls, joint pain and myalgias.  Skin: Positive for rash (Intertriginous). Negative for itching.  Neurological: Positive for tingling (diabetic neuropathy in feet) and weakness (generalized). Negative for dizziness, tremors and headaches.  Endo/Heme/Allergies: Positive for environmental allergies (season allergies - "all my life"). Does not bruise/bleed easily.       T2DM  Psychiatric/Behavioral: Negative for depression, memory loss and suicidal ideas. The patient is not nervous/anxious and does not have insomnia.   All other systems reviewed and are negative.  Performance status (ECOG): 2 - Symptomatic, <50% confined to bed  Physical Exam: Blood pressure (!) 156/79, pulse 65, temperature (!) 97.4 F (36.3 C), temperature source Tympanic, resp. rate 18, weight 162 lb 3.2 oz (73.6 kg). GENERAL:  Slightly fatigued appearing gentleman sitting comfortably in the exam room in no acute distress. MENTAL STATUS:  Alert and oriented to person, place and time. HEAD:  Thin white hair.  Normocephalic, atraumatic, face symmetric, no Cushingoid features. EYES:  Brown eyes.  Pupils equal round and reactive to light and accomodation.  No conjunctivitis or scleral icterus. ENT:  Oropharynx clear without lesion.  Tongue normal. Mucous membranes moist.  RESPIRATORY:  Clear to auscultation without rales, wheezes or rhonchi. CARDIOVASCULAR:  Regular rate and rhythm without murmur, rub or gallop. ABDOMEN:   Soft, non-tender, with active bowel sounds, and no hepatosplenomegaly.  No masses. SKIN:  No rashes, ulcers or lesions. EXTREMITIES: No edema, no skin discoloration or tenderness.  No palpable cords. LYMPH NODES: No palpable cervical, supraclavicular, axillary or inguinal adenopathy  NEUROLOGICAL: Unremarkable. PSYCH:  Appropriate.    Infusion on 12/31/2017  Component Date Value Ref Range Status  . Magnesium 12/31/2017 1.9  1.7 - 2.4 mg/dL Final   Performed at [facility], [address], [place], [place] [address]  . Sodium 12/31/2017 137  135 - 145 mmol/L Final  . Potassium 12/31/2017 3.9  3.5 - 5.1 mmol/L Final  . Chloride 12/31/2017 102  101 - 111 mmol/L Final  . CO2 12/31/2017 26  22 - 32 mmol/L Final  . Glucose, Bld 12/31/2017 125* 65 - 99 mg/dL Final  . BUN 12/31/2017 8  6 - 20 mg/dL Final  . Creatinine, Ser 12/31/2017 0.55* 0.61 - 1.24 mg/dL Final  . Calcium 12/31/2017 9.2  8.9 - 10.3 mg/dL Final  . Total Protein 12/31/2017 6.4* 6.5 - 8.1 g/dL Final  . Albumin 12/31/2017 3.1* 3.5 - 5.0 g/dL Final  . AST 12/31/2017 23  15 - 41 U/L Final  . ALT 12/31/2017 22  17 - 63 U/L Final  . Alkaline Phosphatase 12/31/2017 73  38 - 126 U/L Final  . Total Bilirubin 12/31/2017 0.4  0.3 - 1.2 mg/dL Final  . GFR calc non Af Amer 12/31/2017 >60  >60 mL/min Final  . GFR calc Af Amer 12/31/2017 >60  >60 mL/min Final   Comment: (NOTE) The eGFR has been calculated using the CKD EPI equation. This calculation has not been validated in all clinical situations. eGFR's persistently <60 mL/min signify possible Chronic Kidney Disease.   Georgiann Hahn gap 12/31/2017 9  5 - 15 Final   Performed at [facility], [address] [place], [place] [address]  . WBC 12/31/2017 4.0  3.8 - 10.6 K/uL Final  . RBC 12/31/2017 2.92* 4.40 - 5.90 MIL/uL Final  . Hemoglobin 12/31/2017 8.9*  13.0 - 18.0 g/dL Final  . HCT 12/31/2017 25.6* 40.0 - 52.0 % Final  . MCV 12/31/2017 87.6  80.0 - 100.0 fL Final  .  MCH 12/31/2017 30.5  26.0 - 34.0 pg Final  . MCHC 12/31/2017 34.8  32.0 - 36.0 g/dL Final  . RDW 12/31/2017 13.5  11.5 - 14.5 % Final  . Platelets 12/31/2017 146* 150 - 440 K/uL Final  . Neutrophils Relative % 12/31/2017 64  % Final  . Neutro Abs 12/31/2017 2.5  1.4 - 6.5 K/uL Final  . Lymphocytes Relative 12/31/2017 22  % Final  . Lymphs Abs 12/31/2017 0.9* 1.0 - 3.6 K/uL Final  . Monocytes Relative 12/31/2017 10  % Final  . Monocytes Absolute 12/31/2017 0.4  0.2 - 1.0 K/uL Final  . Eosinophils Relative 12/31/2017 3  % Final  . Eosinophils Absolute 12/31/2017 0.1  0 - 0.7 K/uL Final  . Basophils Relative 12/31/2017 1  % Final  . Basophils Absolute 12/31/2017 0.0  0 - 0.1 K/uL Final   Performed at [facility], [address] [place], [place] [address]    Assessment:  HAYTHAM MAHER is a 72 y.o. 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  He presented with abdominal pain and weight loss.  Abdomen and pelvic CT on 12/01/2017 revealed a suspicious enhancing, filling defect involving the lower pole collecting system of the left kidney with asymmetric hypoenhancement of the inferior pole cortex of left kidney noted. Findings were worrisome for urothelial neoplasm.  There was abdominal and pelvic adenopathy compatible with nodal metastasis.  There was a 2.6 cm right para celiac node, 1,6 cm left retroperitoneal node, 1.4 cm right retroperitoneal node, 2.1 cm right common iliac node and 1 cm left external iliac node.  There was aortic atherosclerosis, kidney cysts, and prostate gland enlargement.  He began cycle #1 carboplatin + gemcitabine on 12/24/2017. He is tolerating treatment well.   Chest CT on 12/07/2017 revealed no evidence of metastatic disease.    He underwent left ureteroscopy with renal pelvis biopsy and left ureteral stent placement on 12/10/2017.  Findings included a large nodular, vascular tumor within the left lower pole  collecting system completely obstructing the entire left lower pole moiety.  Ureter, right kidney, and bladder was normal.  Pathology revealed small fragments of high-grade urothelial carcinoma with a small focus of invasion.  He has diabetes and coronary artery disease s/p MI in 1995.  He is s/p stent placement x 5-6.  Colonoscopy in 2018 was negative.  Symptomatically, he is doing well.  Constipation has resolved.  Pain is controlled.  Exam is stable.  Hemoglobin is 8.9.  Platelet count is 146,000.  Plan: 1. Labs today:  CBC with diff, CMP, Mg, ferritin, iron stores, ferritin, TSH, retic, B12, folate 2. Discuss previously sent FGFR mutation testing for consideration of treatment with oral Balversa. Available FFPE block had insufficient tissue for testing. No additional FFPE blocks available to send for testing. Discuss that to be considered for treatment using Balversa, additional tissue would have to be obtained via biopsy and sent for testing.  3. Discuss initial chemotherapy treatment. Patient tolerated well. He received an 80% gemcitabine dose. Discuss thoughts on increasing to full dose therapy beginning with next cycle. 4. Labs reviewed. Blood counts stable and adequate enough for treatment. Will proceed with day 8 of cycle #1 gemcitabine. 5. Discuss developing anemia. Patient is not symptomatic. Will check additional labs today to further assess for  underlying causes.  6. Discuss magnesium. Magnesium has improved to 1.9 today. Continue Mag-ox 400 mg daily.   7. Discuss protein calorie malnutrition. Patient continues to lose weight. He is down 14 pounds since his last visit. Encouraged him to increase his intake of calorie and protein dense food choices. Additionally, patient was encouraged to utilize nutritional supplement shakes at least 2-3 times a day. Patient to meet with cancer center RD today to discuss nutrition and weight management strategies.  8. Discuss symptom management.  Patient  has antiemetics and pain medications at home to use on a PRN basis. Patient  advising that the  prescribed interventions are adequate at this point. Continue all medications as previously prescribed.  9. Discuss OIC. Constipation has improved with the currently prescribed bowel regimen (Miralax and Senakot). He has self discontinued the Linzess. Continue as previously prescribed.  10. Discuss palliative care. Patient is now being followed by outpatient palliative care team. Will plan on coordinating care with palliative care team.  11. RTC in 1 week for labs (CBC with diff). Anticipate that the patient will be neutropenic and thrombocytopenic. Preemptively discussed neutropenic precautions, including need to avoid sick contacts and maintain strict handwashing.  12. RTC on 01/14/2018 for MD assessment, labs (CBC with diff, CMP, Mg), and day 1 of cycle #2 carboplatin + gemcitabine. 13. RTC on 01/21/2018 for NP assessment, labs (CBC with diff, CMP, Mg), and day 8 of cycle #2 gemcitabine.    Addendum:  Patient was contacted regarding low B12 level and plan to start oral B12 1000 mcg a day.  Recheck level in 1 month.   Honor Loh, NP  12/31/17, 10:55 AM  I saw and evaluated the patient, participating in the key portions of the service and reviewing pertinent diagnostic studies and records.  I reviewed the nurse practitioner's note and agree with the findings and the plan.  The assessment and plan were discussed with the patient. Multiple questions were asked by the patient and answered.  Nolon Stalls, MD 12/31/2017,10:55 AM

## 2017-12-31 ENCOUNTER — Inpatient Hospital Stay (HOSPITAL_BASED_OUTPATIENT_CLINIC_OR_DEPARTMENT_OTHER): Payer: Medicare Other | Admitting: Hematology and Oncology

## 2017-12-31 ENCOUNTER — Other Ambulatory Visit: Payer: Self-pay

## 2017-12-31 ENCOUNTER — Inpatient Hospital Stay: Payer: Medicare Other

## 2017-12-31 ENCOUNTER — Other Ambulatory Visit: Payer: Self-pay | Admitting: Family Medicine

## 2017-12-31 ENCOUNTER — Encounter: Payer: Self-pay | Admitting: Hematology and Oncology

## 2017-12-31 VITALS — BP 156/79 | HR 65 | Temp 97.4°F | Resp 18 | Wt 162.2 lb

## 2017-12-31 DIAGNOSIS — R21 Rash and other nonspecific skin eruption: Secondary | ICD-10-CM

## 2017-12-31 DIAGNOSIS — E538 Deficiency of other specified B group vitamins: Secondary | ICD-10-CM

## 2017-12-31 DIAGNOSIS — I2510 Atherosclerotic heart disease of native coronary artery without angina pectoris: Secondary | ICD-10-CM | POA: Diagnosis not present

## 2017-12-31 DIAGNOSIS — C642 Malignant neoplasm of left kidney, except renal pelvis: Secondary | ICD-10-CM | POA: Diagnosis not present

## 2017-12-31 DIAGNOSIS — C689 Malignant neoplasm of urinary organ, unspecified: Secondary | ICD-10-CM

## 2017-12-31 DIAGNOSIS — E1140 Type 2 diabetes mellitus with diabetic neuropathy, unspecified: Secondary | ICD-10-CM | POA: Diagnosis not present

## 2017-12-31 DIAGNOSIS — Z5111 Encounter for antineoplastic chemotherapy: Secondary | ICD-10-CM | POA: Diagnosis not present

## 2017-12-31 DIAGNOSIS — E46 Unspecified protein-calorie malnutrition: Secondary | ICD-10-CM | POA: Diagnosis not present

## 2017-12-31 DIAGNOSIS — G893 Neoplasm related pain (acute) (chronic): Secondary | ICD-10-CM

## 2017-12-31 DIAGNOSIS — E119 Type 2 diabetes mellitus without complications: Secondary | ICD-10-CM | POA: Diagnosis not present

## 2017-12-31 DIAGNOSIS — I951 Orthostatic hypotension: Secondary | ICD-10-CM | POA: Diagnosis not present

## 2017-12-31 DIAGNOSIS — I10 Essential (primary) hypertension: Secondary | ICD-10-CM | POA: Diagnosis not present

## 2017-12-31 DIAGNOSIS — Z7189 Other specified counseling: Secondary | ICD-10-CM

## 2017-12-31 DIAGNOSIS — D649 Anemia, unspecified: Secondary | ICD-10-CM

## 2017-12-31 LAB — CBC WITH DIFFERENTIAL/PLATELET
Basophils Absolute: 0 10*3/uL (ref 0–0.1)
Basophils Relative: 1 %
Eosinophils Absolute: 0.1 10*3/uL (ref 0–0.7)
Eosinophils Relative: 3 %
HCT: 25.6 % — ABNORMAL LOW (ref 40.0–52.0)
Hemoglobin: 8.9 g/dL — ABNORMAL LOW (ref 13.0–18.0)
Lymphocytes Relative: 22 %
Lymphs Abs: 0.9 10*3/uL — ABNORMAL LOW (ref 1.0–3.6)
MCH: 30.5 pg (ref 26.0–34.0)
MCHC: 34.8 g/dL (ref 32.0–36.0)
MCV: 87.6 fL (ref 80.0–100.0)
Monocytes Absolute: 0.4 10*3/uL (ref 0.2–1.0)
Monocytes Relative: 10 %
Neutro Abs: 2.5 10*3/uL (ref 1.4–6.5)
Neutrophils Relative %: 64 %
Platelets: 146 10*3/uL — ABNORMAL LOW (ref 150–440)
RBC: 2.92 MIL/uL — ABNORMAL LOW (ref 4.40–5.90)
RDW: 13.5 % (ref 11.5–14.5)
WBC: 4 10*3/uL (ref 3.8–10.6)

## 2017-12-31 LAB — IRON AND TIBC
Iron: 33 ug/dL — ABNORMAL LOW (ref 45–182)
Saturation Ratios: 16 % — ABNORMAL LOW (ref 17.9–39.5)
TIBC: 205 ug/dL — ABNORMAL LOW (ref 250–450)
UIBC: 172 ug/dL

## 2017-12-31 LAB — COMPREHENSIVE METABOLIC PANEL
ALT: 22 U/L (ref 17–63)
AST: 23 U/L (ref 15–41)
Albumin: 3.1 g/dL — ABNORMAL LOW (ref 3.5–5.0)
Alkaline Phosphatase: 73 U/L (ref 38–126)
Anion gap: 9 (ref 5–15)
BUN: 8 mg/dL (ref 6–20)
CO2: 26 mmol/L (ref 22–32)
Calcium: 9.2 mg/dL (ref 8.9–10.3)
Chloride: 102 mmol/L (ref 101–111)
Creatinine, Ser: 0.55 mg/dL — ABNORMAL LOW (ref 0.61–1.24)
GFR calc Af Amer: >60 mL/min (ref >60)
GFR calc non Af Amer: >60 mL/min (ref >60)
Glucose, Bld: 125 mg/dL — ABNORMAL HIGH (ref 65–99)
Potassium: 3.9 mmol/L (ref 3.5–5.1)
Sodium: 137 mmol/L (ref 135–145)
Total Bilirubin: 0.4 mg/dL (ref 0.3–1.2)
Total Protein: 6.4 g/dL — ABNORMAL LOW (ref 6.5–8.1)

## 2017-12-31 LAB — VITAMIN B12: VITAMIN B 12: 247 pg/mL (ref 180–914)

## 2017-12-31 LAB — RETICULOCYTES
RBC.: 3.11 MIL/uL — AB (ref 4.40–5.90)
RETIC COUNT ABSOLUTE: 46.7 10*3/uL (ref 19.0–183.0)
RETIC CT PCT: 1.5 % (ref 0.4–3.1)

## 2017-12-31 LAB — FOLATE: Folate: 20.7 ng/mL (ref >5.9)

## 2017-12-31 LAB — MAGNESIUM: Magnesium: 1.9 mg/dL (ref 1.7–2.4)

## 2017-12-31 LAB — FERRITIN: FERRITIN: 277 ng/mL (ref 24–336)

## 2017-12-31 LAB — TSH: TSH: 2.991 u[IU]/mL (ref 0.350–4.500)

## 2017-12-31 MED ORDER — SODIUM CHLORIDE 0.9% FLUSH
10.0000 mL | INTRAVENOUS | Status: DC | PRN
  Administered 2017-12-31 (×2): 10 mL via INTRAVENOUS
  Filled 2017-12-31: qty 10

## 2017-12-31 MED ORDER — SODIUM CHLORIDE 0.9 % IV SOLN
1600.0000 mg | Freq: Once | INTRAVENOUS | Status: AC
  Administered 2017-12-31: 1600 mg via INTRAVENOUS
  Filled 2017-12-31: qty 15.78

## 2017-12-31 MED ORDER — SODIUM CHLORIDE 0.9 % IV SOLN
Freq: Once | INTRAVENOUS | Status: AC
  Administered 2017-12-31: 12:00:00 via INTRAVENOUS
  Filled 2017-12-31: qty 1000

## 2017-12-31 MED ORDER — HEPARIN SOD (PORK) LOCK FLUSH 100 UNIT/ML IV SOLN
500.0000 [IU] | Freq: Once | INTRAVENOUS | Status: AC
  Administered 2017-12-31: 500 [IU] via INTRAVENOUS
  Filled 2017-12-31: qty 5

## 2017-12-31 MED ORDER — ONDANSETRON HCL 4 MG PO TABS
8.0000 mg | ORAL_TABLET | Freq: Once | ORAL | Status: AC
  Administered 2017-12-31: 8 mg via ORAL
  Filled 2017-12-31: qty 2

## 2017-12-31 NOTE — Progress Notes (Signed)
Nutrition Assessment   Reason for Assessment:   Patient identified on Malnutrition screening report for poor appetite and weight loss  ASSESSMENT:  72 year old male with metastatic urothelial cancer currently on chemotherapy.  Noted recent hospital admission 6/10-6/12 for pain, constipation.  Past medical history of HLD, DM, GERD, HTN, MI  Met with patient during infusion this am. Maudry Diego was with him today.  Patient reports appetite is improving.  Reports he ate yogurt this am with ensure, some mornings eats bran flakes and fruit.  Usually skips lunch and has evening meal. Reports soups have been tasting well, tomato sandwich.  Reports that appetite was really effected due to uncontrolled pain and constipation.  Reports that both of those are improved.     Nutrition Focused Physical Exam: deferred  Medications: senakot, metformin, zofran, miralax  Labs: glucose 125, creatinine 0.55, iron 33, Hgb 8.9  Anthropometrics:   Height: 68 inches Weight: 162 lb UBW: 170 lb since Jan 2019.  Was 255 lb prior to that but lost weight with nutrisystem intentionally BMI: 26  Noted weight of 176 lb on 6/14 (? Accuracy) as weight on 6/10 was 157-161 lb  5% weight loss since Jan 2019 not including weight of 176 lb on 6/14  Estimated Energy Needs  Kcals: 1850-2200 calories Protein: 93-110 g/d Fluid: 2.2 L/d  NUTRITION DIAGNOSIS: Inadequate oral intake related to pain and constipation as evidenced by weight loss and decreased intake   INTERVENTION:  Discussed importance of nutrition.  Patient does not want to gain weight back.  Discussed ways to increase calories and protein.  Fact sheet given. Encouraged ensure enlive/boost plus at this time for increased calories.  Recommend check blood glucose and notifiy MD regarding elevated blood glucose and adjust medications to control blood glucose vs limiting intake at this time. Encouraged patient to take medications to help relieve constipation and  pain.      MONITORING, EVALUATION, GOAL: Patient will consume adequate calories and protein to meet nutritional needs and prevent further weight loss   NEXT VISIT: July 15 during infusion  Parneet Glantz B. Zenia Resides, [place], [place] Registered Dietitian [phone] (pager)

## 2017-12-31 NOTE — Telephone Encounter (Signed)
Pt's wife states Hospice has everything they need except the signature from Dr. Rosanna Randy.  States she is calling wanting to expedite the process.

## 2017-12-31 NOTE — Telephone Encounter (Signed)
Pt needs refill on his Clobetasol Propionate Cream USP 0.05%  He uses [facility]  [address] [facility] # [phone]  [place]

## 2017-12-31 NOTE — Telephone Encounter (Signed)
Hospice called to check and see if order is to be placed and signature needed.  CB [phone]

## 2017-12-31 NOTE — Progress Notes (Signed)
Patient here for follow up. Concerned about narcotic pain medications, would like to know about weaning off of pain medications. Pt states he has been feeling weaker. Today he denies dizziness or lightheadedness.

## 2018-01-01 DIAGNOSIS — E538 Deficiency of other specified B group vitamins: Secondary | ICD-10-CM | POA: Insufficient documentation

## 2018-01-01 MED ORDER — CLOBETASOL PROPIONATE 0.05 % EX CREA: 1.0000 "application " | TOPICAL_CREAM | CUTANEOUS | 1 refills | Status: AC | PRN

## 2018-01-01 NOTE — Telephone Encounter (Signed)
Please review orders.

## 2018-01-02 ENCOUNTER — Telehealth: Payer: Self-pay | Admitting: *Deleted

## 2018-01-02 NOTE — Telephone Encounter (Signed)
Attempted to call patient to discuss B-12 - no answer.  Will try again tomorrow.

## 2018-01-02 NOTE — Telephone Encounter (Signed)
-----   Message from Karen Kitchens, NP sent at 01/01/2018  6:14 PM EDT ----- B12 level low. Will start him or oral B12 1,000 mcg daily, with plans to recheck level in 1 month. If no improvement in labs in 1 month, we may been to consider parenteral supplementation.   Thanks,  Gaspar Bidding  ----- Message ----- From: Buel Ream, Lab In [place] Sent: 12/31/2017  12:35 PM To: Karen Kitchens, NP

## 2018-01-04 ENCOUNTER — Telehealth: Payer: Self-pay | Admitting: *Deleted

## 2018-01-04 ENCOUNTER — Other Ambulatory Visit: Payer: Self-pay | Admitting: *Deleted

## 2018-01-04 DIAGNOSIS — E538 Deficiency of other specified B group vitamins: Secondary | ICD-10-CM

## 2018-01-04 NOTE — Telephone Encounter (Signed)
Called patient's wife to inform her that patient's B-12 level is low.  MD recommends 1000 mcg of B-12 daily  Will recheck in one month.  If not better may consider B-12 peripherally.  Verbalized understanding.

## 2018-01-04 NOTE — Telephone Encounter (Signed)
-----   Message from Karen Kitchens, NP sent at 01/01/2018  6:14 PM EDT ----- B12 level low. Will start him or oral B12 1,000 mcg daily, with plans to recheck level in 1 month. If no improvement in labs in 1 month, we may been to consider parenteral supplementation.   Thanks,  Gaspar Bidding  ----- Message ----- From: Buel Ream, Lab In [place] Sent: 12/31/2017  12:35 PM To: Karen Kitchens, NP

## 2018-01-07 ENCOUNTER — Encounter: Payer: Self-pay | Admitting: Family Medicine

## 2018-01-07 ENCOUNTER — Inpatient Hospital Stay: Payer: Medicare Other | Attending: Hematology and Oncology

## 2018-01-07 ENCOUNTER — Telehealth: Payer: Self-pay | Admitting: *Deleted

## 2018-01-07 ENCOUNTER — Ambulatory Visit (INDEPENDENT_AMBULATORY_CARE_PROVIDER_SITE_OTHER): Payer: Medicare Other | Admitting: Family Medicine

## 2018-01-07 VITALS — BP 132/68 | HR 60 | Temp 98.6°F | Resp 16 | Ht 68.0 in | Wt 163.0 lb

## 2018-01-07 DIAGNOSIS — Z5189 Encounter for other specified aftercare: Secondary | ICD-10-CM | POA: Diagnosis not present

## 2018-01-07 DIAGNOSIS — I2510 Atherosclerotic heart disease of native coronary artery without angina pectoris: Secondary | ICD-10-CM | POA: Diagnosis not present

## 2018-01-07 DIAGNOSIS — Z5111 Encounter for antineoplastic chemotherapy: Secondary | ICD-10-CM | POA: Insufficient documentation

## 2018-01-07 DIAGNOSIS — L509 Urticaria, unspecified: Secondary | ICD-10-CM

## 2018-01-07 DIAGNOSIS — C642 Malignant neoplasm of left kidney, except renal pelvis: Secondary | ICD-10-CM

## 2018-01-07 DIAGNOSIS — E440 Moderate protein-calorie malnutrition: Secondary | ICD-10-CM | POA: Diagnosis not present

## 2018-01-07 DIAGNOSIS — C778 Secondary and unspecified malignant neoplasm of lymph nodes of multiple regions: Secondary | ICD-10-CM | POA: Diagnosis not present

## 2018-01-07 LAB — CBC WITH DIFFERENTIAL/PLATELET
Basophils Absolute: 0 10*3/uL (ref 0–0.1)
Basophils Relative: 1 %
Eosinophils Absolute: 0.1 10*3/uL (ref 0–0.7)
Eosinophils Relative: 3 %
HCT: 27.1 % — ABNORMAL LOW (ref 40.0–52.0)
Hemoglobin: 9.1 g/dL — ABNORMAL LOW (ref 13.0–18.0)
Lymphocytes Relative: 32 %
Lymphs Abs: 0.8 10*3/uL — ABNORMAL LOW (ref 1.0–3.6)
MCH: 30.1 pg (ref 26.0–34.0)
MCHC: 33.6 g/dL (ref 32.0–36.0)
MCV: 89.6 fL (ref 80.0–100.0)
Monocytes Absolute: 0.4 10*3/uL (ref 0.2–1.0)
Monocytes Relative: 17 %
Neutro Abs: 1.2 10*3/uL — ABNORMAL LOW (ref 1.4–6.5)
Neutrophils Relative %: 47 %
Platelets: 227 10*3/uL (ref 150–440)
RBC: 3.03 MIL/uL — ABNORMAL LOW (ref 4.40–5.90)
RDW: 13.7 % (ref 11.5–14.5)
WBC: 2.6 10*3/uL — ABNORMAL LOW (ref 3.8–10.6)

## 2018-01-07 LAB — MAGNESIUM: Magnesium: 1.9 mg/dL (ref 1.7–2.4)

## 2018-01-07 LAB — BASIC METABOLIC PANEL
Anion gap: 9 (ref 5–15)
BUN: 13 mg/dL (ref 8–23)
CO2: 26 mmol/L (ref 22–32)
Calcium: 9.3 mg/dL (ref 8.9–10.3)
Chloride: 101 mmol/L (ref 98–111)
Creatinine, Ser: 0.55 mg/dL — ABNORMAL LOW (ref 0.61–1.24)
GFR calc Af Amer: >60 mL/min (ref >60)
GFR calc non Af Amer: >60 mL/min (ref >60)
Glucose, Bld: 123 mg/dL — ABNORMAL HIGH (ref 70–99)
Potassium: 4.2 mmol/L (ref 3.5–5.1)
Sodium: 136 mmol/L (ref 135–145)

## 2018-01-07 NOTE — Telephone Encounter (Signed)
-----   Message from [name], MD sent at 01/07/2018  1:55 PM EDT ----- Regarding: Please call patient  Neutropenic precautions  M  ----- Message ----- From: Interface, Lab In [place] Sent: 01/07/2018  12:05 PM To: [name], MD

## 2018-01-07 NOTE — Telephone Encounter (Signed)
-----   Message from [name], MD sent at 01/07/2018  1:55 PM EDT ----- Regarding: Please call patient  Neutropenic precautions  M  ----- Message ----- From: Interface, Lab In [place] Sent: 01/07/2018  12:05 PM To: [name], MD

## 2018-01-07 NOTE — Progress Notes (Signed)
Patient: Patrick Mcgee Male    DOB: [birth year]   72 y.o.   MRN: [id number] Visit Date: 01/07/2018  Today's Provider: Wilhemena Durie, MD   Chief Complaint  Patient presents with  . Constipation  . Weight Loss   Subjective:    HPI Patient comes in today for a follow up. He was seen in the office 2 weeks ago.   Constipation- Patient was advised to start Linzess every other day plus Miralax. Due to cost, the patient never started Linzess. He reports that his symptoms were relieved with Milk of Magnesia instead.   Weight loss- Patient has lost 13lbs since last office visit. He reports that his appetite has decreased a little. He is taking Ensure to help with this.   Dry, itchy skin- Patient reports that he has "itching" all over that seems to be worsening. He has taken Benedryl with no relief.    Allergies  Allergen Reactions  . Sulfa Antibiotics Other (See Comments)    Joint pain  . Ace Inhibitors Cough  . Invokana [Canagliflozin] Other (See Comments)    Leg pain  . Penicillins Rash    Has patient had a PCN reaction causing immediate rash, facial/tongue/throat swelling, SOB or lightheadedness with hypotension: Yes Has patient had a PCN reaction causing severe rash involving mucus membranes or skin necrosis: No Has patient had a PCN reaction that required hospitalization: No Has patient had a PCN reaction occurring within the last 10 years: No If all of the above answers are "NO", then may proceed with Cephalosporin use.     Current Outpatient Medications:  .  acetaminophen (TYLENOL) 500 MG tablet, Take 500 mg by mouth every 6 (six) hours as needed for moderate pain. When taking oxycodone 5 mg tablets, Disp: , Rfl:  .  ALPRAZolam (XANAX) 0.25 MG tablet, Take 1 tablet (0.25 mg total) by mouth 3 (three) times daily as needed for anxiety., Disp: 20 tablet, Rfl: 0 .  atorvastatin (LIPITOR) 40 MG tablet, TAKE ONE TABLET BY MOUTH ONCE DAILY (Patient taking differently:  TAKE ONE TABLET BY MOUTH DAILY AT BEDTIME), Disp: 90 tablet, Rfl: 3 .  carvedilol (COREG) 12.5 MG tablet, TAKE ONE TABLET BY MOUTH TWICE DAILY, Disp: 180 tablet, Rfl: 3 .  clobetasol cream (TEMOVATE) 3.14 %, Apply 1 application topically as needed (for skin rash)., Disp: 30 g, Rfl: 1 .  fentaNYL (DURAGESIC - DOSED MCG/HR) 25 MCG/HR patch, Place 1 patch (25 mcg total) onto the skin every 3 (three) days., Disp: 10 patch, Rfl: 0 .  fluticasone (FLONASE) 50 MCG/ACT nasal spray, Place 2 sprays into both nostrils daily. (Patient taking differently: Place 2 sprays into both nostrils daily as needed for allergies or rhinitis. ), Disp: 16 g, Rfl: 12 .  glucose blood (CONTOUR NEXT TEST) test strip, Checks sugar 4 times daily. DX E11.9-strips for Contour next EZ meter, Disp: 360 each, Rfl: 3 .  Ivermectin (SOOLANTRA) 1 % CREA, Apply topically at bedtime. To face, Disp: , Rfl:  .  lidocaine-prilocaine (EMLA) cream, Apply 1 application topically as needed., Disp: 30 g, Rfl: 3 .  losartan (COZAAR) 100 MG tablet, TAKE ONE TABLET BY MOUTH ONCE DAILY (Patient taking differently: TAKE ONE TABLET BY MOUTH DAILY AT BEDTIME), Disp: 90 tablet, Rfl: 3 .  meclizine (ANTIVERT) 25 MG tablet, Take 25 mg by mouth 3 (three) times daily as needed for dizziness., Disp: , Rfl:  .  metFORMIN (GLUCOPHAGE) 1000 MG tablet, Take 1 tablet (1,000  mg total) by mouth 2 (two) times daily with a meal., Disp: 180 tablet, Rfl: 3 .  nystatin cream (MYCOSTATIN), APPLY  CREAM TOPICALLY TWICE DAILY AROUND  THE  GROIN  AREA (Patient taking differently: APPLY  CREAM TOPICALLY TWICE DAILY AROUND  THE  GROIN  AREA AS NEEDED FOR SKIN RASH), Disp: 15 g, Rfl: 1 .  ondansetron (ZOFRAN) 8 MG tablet, Take 1 tablet (8 mg total) by mouth every 8 (eight) hours as needed for nausea or vomiting (if needed beginning 3 days after chemotherapy)., Disp: 20 tablet, Rfl: 0 .  polyethylene glycol (MIRALAX) packet, Take 17 g by mouth 2 (two) times daily. (Patient taking  differently: Take 17 g by mouth 2 (two) times daily as needed for moderate constipation. ), Disp: 14 each, Rfl: 0 .  feeding supplement, ENSURE ENLIVE, (ENSURE ENLIVE) LIQD, Take 237 mLs by mouth 3 (three) times daily between meals. (Patient not taking: Reported on 01/07/2018), Disp: 90 Bottle, Rfl: 0 .  HYDROmorphone (DILAUDID) 2 MG tablet, Take 1 tablet (2 mg total) by mouth every 4 (four) hours as needed for severe pain (breakthrough pain). (Patient not taking: Reported on 12/31/2017), Disp: 40 tablet, Rfl: 0 .  lactulose (CEPHULAC) 20 g packet, Take 1 packet (20 g total) by mouth 2 (two) times daily as needed (constipation). (Patient not taking: Reported on 01/07/2018), Disp: 60 each, Rfl: 0 .  linaclotide (LINZESS) 290 MCG CAPS capsule, Take 1 capsule (290 mcg total) by mouth daily before breakfast. (Patient not taking: Reported on 12/31/2017), Disp: 30 capsule, Rfl: 0 .  oxyCODONE (OXYCONTIN) 15 mg 12 hr tablet, Take 1 tablet (15 mg total) by mouth every 12 (twelve) hours. (Patient not taking: Reported on 12/31/2017), Disp: 20 tablet, Rfl: 0 .  sodium chloride 1 g tablet, Take 1 tablet (1 g total) by mouth 3 (three) times daily with meals. (Patient not taking: Reported on 12/31/2017), Disp: 15 tablet, Rfl: 0 .  tamsulosin (FLOMAX) 0.4 MG CAPS capsule, Take 1 capsule (0.4 mg total) by mouth daily. (Patient not taking: Reported on 12/31/2017), Disp: 30 capsule, Rfl: 0  Review of Systems  Constitutional: Positive for activity change, appetite change and fatigue.  HENT: Negative.   Eyes: Negative.   Respiratory: Negative for cough, shortness of breath and wheezing.   Cardiovascular: Negative for chest pain, palpitations and leg swelling.  Endocrine: Negative.   Musculoskeletal: Positive for arthralgias.  Skin: Negative for color change, pallor, rash and wound.  Allergic/Immunologic: Negative.   Neurological: Negative.   Hematological: Negative.   Psychiatric/Behavioral: Positive for sleep disturbance.  Negative for agitation, self-injury and suicidal ideas. The patient is not nervous/anxious.     Social History   Tobacco Use  . Smoking status: Never Smoker  . Smokeless tobacco: Former Systems developer    Types: Chew  Substance Use Topics  . Alcohol use: Yes    Alcohol/week: 0.6 - 3.0 oz    Types: 1 Standard drinks or equivalent per week    Comment: rarely   Objective:   BP 132/68 (BP Location: Left Arm, Patient Position: Sitting, Cuff Size: Normal)   Pulse 60   Temp 98.6 F (37 C)   Resp 16   Ht 5\' 8"  (1.727 m)   Wt 163 lb (73.9 kg)   SpO2 98%   BMI 24.78 kg/m  Vitals:   01/07/18 1037  BP: 132/68  Pulse: 60  Resp: 16  Temp: 98.6 F (37 C)  SpO2: 98%  Weight: 163 lb (73.9 kg)  Height: 5'  8" (1.727 m)     Physical Exam  Constitutional: He is oriented to person, place, and time. He appears well-developed and well-nourished.  HENT:  Head: Normocephalic and atraumatic.  Right Ear: External ear normal.  Left Ear: External ear normal.  Nose: Nose normal.  Mouth/Throat: Oropharynx is clear and moist.  Eyes: Conjunctivae are normal. No scleral icterus.  Neck: No thyromegaly present.  Cardiovascular: Normal rate, regular rhythm and normal heart sounds.  Pulmonary/Chest: Effort normal and breath sounds normal.  Abdominal: Soft.  Musculoskeletal: He exhibits no edema.  Lymphadenopathy:    He has no cervical adenopathy.  Neurological: He is alert and oriented to person, place, and time.  Skin: Skin is warm and dry.  Psychiatric: He has a normal mood and affect. His behavior is normal. Judgment and thought content normal.        Assessment & Plan:     Urothelial Carcinoma CAD Weight Loss due to cancer Constipation Improved Urticaria Labs OK. Try Doxepin 10mg  QID prn.     I have done the exam and reviewed the above chart and it is accurate to the best of my knowledge. Development worker, community has been used in this note in any air is in the dictation or transcription are  unintentional.  Wilhemena Durie, MD  [place]

## 2018-01-07 NOTE — Telephone Encounter (Signed)
Called patient to review neutropenic precautions, per Dr. [name].

## 2018-01-08 ENCOUNTER — Telehealth: Payer: Self-pay

## 2018-01-08 MED ORDER — DOXEPIN HCL 10 MG PO CAPS: 10.0000 mg | ORAL_CAPSULE | Freq: Four times a day (QID) | ORAL | 5 refills | Status: DC | PRN

## 2018-01-08 NOTE — Telephone Encounter (Signed)
Dr. Rosanna Randy, could you please remind me on what medication you wanted to be sent into the pharmacy? Thanks!

## 2018-01-08 NOTE — Telephone Encounter (Signed)
Patient reports that he was suppose to get a prescription sent to  [facility] yesterday for itching and rash.

## 2018-01-08 NOTE — Telephone Encounter (Signed)
Doxepin 10mg  QID prn,#120,5rf.

## 2018-01-08 NOTE — Telephone Encounter (Signed)
Medication was sent in. Advised wife.

## 2018-01-13 NOTE — Progress Notes (Signed)
[facility] day:  01/14/2018   Chief Complaint: Patrick Mcgee is a 72 y.o. male with metastatic urothelial carcinoma who is seen for assessment prior to day 1 of cycle #2 carboplatin + gemcitabine.  HPI:  Patrick Mcgee patient was last seen in the medical oncology clinic on 12/31/2017.  At that time, patient was doing "good". He was tolerating chemotherapy treatments well. No nausea or vomiting. Pain was controlled with the use of a Duragesic 25 mcg patch and APAP as needed for breakthrough. Constipation had improved with Miralax and Senakot. He was able to discontinue Linzess. Appetite stable, but losing weight. He complained of a recurrent intertriginous rash for which he was using Nystatin topical. Exam was stable. He was noted to be more anemic - additional labs ordered. He received day 8 of cycle #1 gemcitabine.   Additional lab studies done on 12/31/2017 to further assess patient's evolving anemia. TSH normal at 2.991. Reticulocytes 1.5%. Iron saturation 16% with a TIBC of 205. Ferritin was normal at 277. Folate was normal at 20.7. B12 level low at 247. He was advised to start oral B12 1,000 mg daily, with plans to recheck levels in one month. Oral B12 was started on 01/07/2018.  Patient met with cancer center RD on 12/31/2017. Notes reviewed. Appetite noted to be improving with the resolution of his constipation. Questionable accuracy of 06/14//2019 recorded weight. Strategies to improve intake of protein and calorie dense foods reviewed. Patient to follow up with RD on 01/21/2018.   CBC on 01/07/2018 revealed a WBC of 2600 (ANC 1200). Hemoglobin 9.1, hematocrit 27.1, MCV 89.6, and platelets 227,000. BMP and Mg levels normal.   In the interim, patient notes that his second treatment "really zapped him". On Wednesday, patient developed diffuse pruritis. He has taking oatmeal baths, which has helped some. He has not experienced and nausea or vomiting associated  with his chemotherapy treatments. Patient denies that he has experienced any B symptoms. He denies any interval infections.   Patient advises that he maintains an adequate appetite. He is eating well. Weight today is 163 lb 12.8 oz (74.3 kg), which compared to his last visit to the clinic, represents a 1 pound increase.   Patient complains of generalized pain, rated 4/10, in the clinic today. He has been off his Duragesic patches for over a week.    Past Medical History:  Diagnosis Date  . Acid reflux   . Anxiety   . Cancer ([place])    renal mass  . Depression   . Diabetes mellitus without complication ([place])   . Hyperlipidemia   . Hypertension   . Myocardial infarction ([place]) 1995  . Sleep apnea     Past Surgical History:  Procedure Laterality Date  . CATARACT EXTRACTION Left   . COLONOSCOPY  2010   [facility]  . COLONOSCOPY WITH PROPOFOL N/A 10/04/2016   Procedure: COLONOSCOPY WITH PROPOFOL;  Surgeon: [name], MD;  Location: [facility] ENDOSCOPY;  Service: Endoscopy;  Laterality: N/A;  . [place], 2000, 2001, 2014  . CYSTOSCOPY W/ RETROGRADES Bilateral 12/10/2017   Procedure: CYSTOSCOPY WITH RETROGRADE PYELOGRAM;  Surgeon: [name], MD;  Location: [facility] ORS;  Service: Urology;  Laterality: Bilateral;  . CYSTOSCOPY WITH STENT PLACEMENT Left 12/10/2017   Procedure: CYSTOSCOPY WITH STENT PLACEMENT;  Surgeon: [name], MD;  Location: [facility] ORS;  Service: Urology;  Laterality: Left;  . INGUINAL HERNIA REPAIR Right 08/09/2015   Procedure: HERNIA  REPAIR INGUINAL ADULT;  Surgeon: [name], MD;  Location: [facility] ORS;  Service: General;  Laterality: Right;  . NASAL SINUS SURGERY    . nuclear stress test    . PORTA CATH INSERTION N/A 12/19/2017   Procedure: PORTA CATH INSERTION;  Surgeon: [name], MD;  Location: [facility];  Service: Cardiovascular;  Laterality: N/A;  . URETERAL BIOPSY Left 12/10/2017   Procedure: URETERAL &  renal PELVIS BIOPSY;  Surgeon: [name], MD;  Location: [facility] ORS;  Service: Urology;  Laterality: Left;  . URETEROSCOPY Left 12/10/2017   Procedure: URETEROSCOPY;  Surgeon: [name], MD;  Location: [facility] ORS;  Service: Urology;  Laterality: Left;    Family History  Problem Relation Age of Onset  . Heart disease Mother   . Cancer Father        Lung and colon cancer  . Heart disease Father   . Emphysema Maternal Grandfather   . Tuberculosis Maternal Grandmother     Social History:  reports that he has never smoked. He has quit using smokeless tobacco. His smokeless tobacco use included chew. He reports that he drinks about 0.6 - 3.0 oz of alcohol per week. He reports that he does not use drugs.  Patient is retired from the [employer]. Patient denies known exposures to radiation on toxins. The patient is accompanied by his wife, Patrick Mcgee, today.  Allergies:  Allergies  Allergen Reactions  . Sulfa Antibiotics Other (See Comments)    Joint pain  . Ace Inhibitors Cough  . Invokana [Canagliflozin] Other (See Comments)    Leg pain  . Penicillins Rash    Has patient had a PCN reaction causing immediate rash, facial/tongue/throat swelling, SOB or lightheadedness with hypotension: Yes Has patient had a PCN reaction causing severe rash involving mucus membranes or skin necrosis: No Has patient had a PCN reaction that required hospitalization: No Has patient had a PCN reaction occurring within the last 10 years: No If all of the above answers are "NO", then may proceed with Cephalosporin use.    Current Medications: Current Outpatient Medications  Medication Sig Dispense Refill  . acetaminophen (TYLENOL) 500 MG tablet Take 500 mg by mouth every 6 (six) hours as needed for moderate pain. When taking oxycodone 5 mg tablets    . ALPRAZolam (XANAX) 0.25 MG tablet Take 1 tablet (0.25 mg total) by mouth 3 (three) times daily as needed for anxiety. 20 tablet 0  . atorvastatin (LIPITOR) 40  MG tablet TAKE ONE TABLET BY MOUTH ONCE DAILY (Patient taking differently: TAKE ONE TABLET BY MOUTH DAILY AT BEDTIME) 90 tablet 3  . carvedilol (COREG) 12.5 MG tablet TAKE ONE TABLET BY MOUTH TWICE DAILY 180 tablet 3  . clobetasol cream (TEMOVATE) 1.76 % Apply 1 application topically as needed (for skin rash). 30 g 1  . doxepin (SINEQUAN) 10 MG capsule Take 1 capsule (10 mg total) by mouth 4 (four) times daily as needed. 120 capsule 5  . feeding supplement, ENSURE ENLIVE, (ENSURE ENLIVE) LIQD Take 237 mLs by mouth 3 (three) times daily between meals. 90 Bottle 0  . fentaNYL (DURAGESIC - DOSED MCG/HR) 25 MCG/HR patch Place 1 patch (25 mcg total) onto the skin every 3 (three) days. 10 patch 0  . fluticasone (FLONASE) 50 MCG/ACT nasal spray Place 2 sprays into both nostrils daily. (Patient taking differently: Place 2 sprays into both nostrils daily as needed for allergies or rhinitis. ) 16 g 12  . glucose blood (CONTOUR NEXT TEST)  test strip Checks sugar 4 times daily. DX E11.9-strips for Contour next EZ meter 360 each 3  . HYDROmorphone (DILAUDID) 2 MG tablet Take 1 tablet (2 mg total) by mouth every 4 (four) hours as needed for severe pain (breakthrough pain). 40 tablet 0  . Ivermectin (SOOLANTRA) 1 % CREA Apply topically at bedtime. To face    . lactulose (CEPHULAC) 20 g packet Take 1 packet (20 g total) by mouth 2 (two) times daily as needed (constipation). 60 each 0  . lidocaine-prilocaine (EMLA) cream Apply 1 application topically as needed. 30 g 3  . linaclotide (LINZESS) 290 MCG CAPS capsule Take 1 capsule (290 mcg total) by mouth daily before breakfast. 30 capsule 0  . losartan (COZAAR) 100 MG tablet TAKE ONE TABLET BY MOUTH ONCE DAILY (Patient taking differently: TAKE ONE TABLET BY MOUTH DAILY AT BEDTIME) 90 tablet 3  . meclizine (ANTIVERT) 25 MG tablet Take 25 mg by mouth 3 (three) times daily as needed for dizziness.    . metFORMIN (GLUCOPHAGE) 1000 MG tablet Take 1 tablet (1,000 mg total) by  mouth 2 (two) times daily with a meal. 180 tablet 3  . nystatin cream (MYCOSTATIN) APPLY  CREAM TOPICALLY TWICE DAILY AROUND  THE  GROIN  AREA (Patient taking differently: APPLY  CREAM TOPICALLY TWICE DAILY AROUND  THE  GROIN  AREA AS NEEDED FOR SKIN RASH) 15 g 1  . ondansetron (ZOFRAN) 8 MG tablet Take 1 tablet (8 mg total) by mouth every 8 (eight) hours as needed for nausea or vomiting (if needed beginning 3 days after chemotherapy). 20 tablet 0  . oxyCODONE (OXYCONTIN) 15 mg 12 hr tablet Take 1 tablet (15 mg total) by mouth every 12 (twelve) hours. 20 tablet 0  . polyethylene glycol (MIRALAX) packet Take 17 g by mouth 2 (two) times daily. (Patient taking differently: Take 17 g by mouth 2 (two) times daily as needed for moderate constipation. ) 14 each 0  . sodium chloride 1 g tablet Take 1 tablet (1 g total) by mouth 3 (three) times daily with meals. 15 tablet 0  . tamsulosin (FLOMAX) 0.4 MG CAPS capsule Take 1 capsule (0.4 mg total) by mouth daily. 30 capsule 0   No current facility-administered medications for this visit.    Facility-Administered Medications Ordered in Other Visits  Medication Dose Route Frequency Provider Last Rate Last Dose  . heparin lock flush 100 unit/mL  500 Units Intravenous Once Corcoran, Melissa C, MD      . sodium chloride flush (NS) 0.9 % injection 10 mL  10 mL Intravenous PRN [name], MD   10 mL at 01/14/18 1761    Review of Systems  Constitutional: Positive for malaise/fatigue. Negative for diaphoresis, fever and weight loss (weight up 1 pound).       Felt 1/2 way decent.  Pain has improved.  HENT: Negative.   Eyes: Negative.   Respiratory: Negative for cough, hemoptysis, sputum production and shortness of breath.   Cardiovascular: Negative for chest pain, palpitations, orthopnea, leg swelling and PND.       MI s/p angioplasty with placement of 5-6 stents (last in 2015). Blood pressure controlled with 3 antihypertensive medications.    Gastrointestinal: Positive for constipation (improved) and heartburn. Negative for abdominal pain, blood in stool, diarrhea, melena, nausea and vomiting.  Genitourinary: Negative for dysuria, frequency, hematuria and urgency.  Musculoskeletal: Positive for back pain (ache). Negative for falls, joint pain and myalgias.  Skin: Positive for itching (diffuse; prone to  itching) and rash (recurrent intertriginous (sub-pannus) - using nystatin).  Neurological: Positive for tingling (diabetic neuropathy in feet) and weakness (generalized). Negative for dizziness, tremors and headaches.  Endo/Heme/Allergies: Positive for environmental allergies (seasonal). Does not bruise/bleed easily.       Type II diabetes  Psychiatric/Behavioral: Negative for depression, memory loss and suicidal ideas. The patient is not nervous/anxious and does not have insomnia.   All other systems reviewed and are negative.  Performance status (ECOG): 2 - Symptomatic, <50% confined to bed  Physical Exam: Blood pressure (!) 158/84, pulse 64, temperature (!) 95.7 F (35.4 C), temperature source Tympanic, weight 163 lb 12.8 oz (74.3 kg). GENERAL:  Well developed, well nourished, gentleman sitting comfortably in the exam room in no acute distress. MENTAL STATUS:  Alert and oriented to person, place and time. HEAD:  Thin gray hair.  Normocephalic, atraumatic, face symmetric, no Cushingoid features. EYES:  Brown eyes.  Pupils equal round and reactive to light and accomodation.  No conjunctivitis or scleral icterus. ENT:  Oropharynx clear without lesion.  Tongue normal. Mucous membranes moist.  RESPIRATORY:  Clear to auscultation without rales, wheezes or rhonchi. CARDIOVASCULAR:  Regular rate and rhythm without murmur, rub or gallop. ABDOMEN:  Soft, non-tender, with active bowel sounds, and no hepatosplenomegaly.  No masses. SKIN:  No rashes, ulcers or lesions. EXTREMITIES: No edema, no skin discoloration or tenderness.  No palpable  cords. LYMPH NODES: No palpable cervical, supraclavicular, axillary or inguinal adenopathy  NEUROLOGICAL: Unremarkable. PSYCH:  Appropriate.    Infusion on 01/14/2018  Component Date Value Ref Range Status  . Sodium 01/14/2018 136  135 - 145 mmol/L Final  . Potassium 01/14/2018 3.9  3.5 - 5.1 mmol/L Final  . Chloride 01/14/2018 104  98 - 111 mmol/L Final   Please note change in reference range.  . CO2 01/14/2018 26  22 - 32 mmol/L Final  . Glucose, Bld 01/14/2018 172* 70 - 99 mg/dL Final   Please note change in reference range.  . BUN 01/14/2018 12  8 - 23 mg/dL Final   Please note change in reference range.  . Creatinine, Ser 01/14/2018 0.60* 0.61 - 1.24 mg/dL Final  . Calcium 01/14/2018 9.2  8.9 - 10.3 mg/dL Final  . GFR calc non Af Amer 01/14/2018 >60  >60 mL/min Final  . GFR calc Af Amer 01/14/2018 >60  >60 mL/min Final   Comment: (NOTE) The eGFR has been calculated using the CKD EPI equation. This calculation has not been validated in all clinical situations. eGFR's persistently <60 mL/min signify possible Chronic Kidney Disease.   Georgiann Hahn gap 01/14/2018 6  5 - 15 Final   Performed at [facility], [address] [place], [place] [address]  . WBC 01/14/2018 4.1  3.8 - 10.6 K/uL Final  . RBC 01/14/2018 3.15* 4.40 - 5.90 MIL/uL Final  . Hemoglobin 01/14/2018 9.8* 13.0 - 18.0 g/dL Final  . HCT 01/14/2018 28.2* 40.0 - 52.0 % Final  . MCV 01/14/2018 89.4  80.0 - 100.0 fL Final  . MCH 01/14/2018 30.9  26.0 - 34.0 pg Final  . MCHC 01/14/2018 34.6  32.0 - 36.0 g/dL Final  . RDW 01/14/2018 15.7* 11.5 - 14.5 % Final  . Platelets 01/14/2018 202  150 - 440 K/uL Final  . Neutrophils Relative % 01/14/2018 55  % Final  . Neutro Abs 01/14/2018 2.3  1.4 - 6.5 K/uL Final  . Lymphocytes Relative 01/14/2018 24  % Final  . Lymphs Abs 01/14/2018 1.0  1.0 -  3.6 K/uL Final  . Monocytes Relative 01/14/2018 17  % Final  . Monocytes Absolute 01/14/2018 0.7  0.2 - 1.0 K/uL Final  .  Eosinophils Relative 01/14/2018 2  % Final  . Eosinophils Absolute 01/14/2018 0.1  0 - 0.7 K/uL Final  . Basophils Relative 01/14/2018 2  % Final  . Basophils Absolute 01/14/2018 0.1  0 - 0.1 K/uL Final   Performed at [facility], [address], [place], [place] [address]  . Magnesium 01/14/2018 1.8  1.7 - 2.4 mg/dL Final   Performed at [facility], [address] [place], [place] [address]    Assessment:  Patrick Mcgee is a 72 y.o. 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  He presented with abdominal pain and weight loss.  Abdomen and pelvic CT on 12/01/2017 revealed a suspicious enhancing, filling defect involving the lower pole collecting system of the left kidney with asymmetric hypoenhancement of the inferior pole cortex of left kidney noted. Findings were worrisome for urothelial neoplasm.  There was abdominal and pelvic adenopathy compatible with nodal metastasis.  There was a 2.6 cm right para celiac node, 1,6 cm left retroperitoneal node, 1.4 cm right retroperitoneal node, 2.1 cm right common iliac node and 1 cm left external iliac node.  There was aortic atherosclerosis, kidney cysts, and prostate gland enlargement.  Chest CT on 12/07/2017 revealed no evidence of metastatic disease.    He underwent left ureteroscopy with renal pelvis biopsy and left ureteral stent placement on 12/10/2017.  Findings included a large nodular, vascular tumor within the left lower pole collecting system completely obstructing the entire left lower pole moiety.  Ureter, right kidney, and bladder was normal.  Pathology revealed small fragments of high-grade urothelial carcinoma with a small focus of invasion.  He is s/p cycle #1 carboplatin + gemcitabine (12/21/2017 - 12/31/2017). He is tolerating treatment well.   He has B12 deficiency.  B12 was 247 on 12/31/2017.  He began oral B12 on 01/07/2018.  He has diabetes and coronary artery  disease s/p MI in 1995.  He is s/p stent placement x 5-6.  Colonoscopy in 2018 was negative.  Symptomatically, patient is tolerating chemotherapy well.  He denies nausea and vomiting. He developed diffuse pruritis without obvious rash (patient "prone to itching").  Constipation has resolved.  Pain is controlled.  Exam is stable.  Hemoglobin is 9.8.  Platelet count is 202,000.  Plan: 1. Labs today:  CBC with diff, CMP, Mg 2. Discuss previous workup for developing anemia. All labs found to be normal with the exception of patient's B12 level. He began oral B12 supplementation on 01/07/2018. 3. Discuss advancing chemotherapy dosing. First chemotherapy cycle was tolerated well. He received an 80% gemcitabine dose.  Discuss current starting counts.  Maintain dose with this cycle and consider advancing dose with cycle #3 if tolerated.  4. Discuss magnesium. Magnesium has improved to 1.8 today. Continue Mag-ox 400 mg daily.   5. Discuss symptom management.  Patient has antiemetics and pain medications at home to use on a PRN basis. Patient  advising that the  prescribed interventions are adequate at this point. Continue all medications as previously prescribed.  6. Discuss palliative care. Patient continued to be followed by outpatient palliative care team. Will continue to coordinate care with palliative care team.  7. Preauthorize GCSF (if needed).  8. RTC on 01/21/2018 for NP assessment, labs (CBC with diff, CMP, Mg), and day 8 of cycle #2 gemcitabine.  9. RTC in 2 weeks for labs (CBC with diff). Anticipate that the patient may become neutropenic. Pre-emptively discussed neutropenic precautions, including need to avoid sick contacts and maintain strict handwashing.  10. RTC on 02/04/2018 for MD assessment, labs (CBC with diff, CMP, Mg), and day 1 of cycle #3 carboplatin + gemcitabine.   Honor Loh, NP  01/14/18, 11:02 AM  I saw and evaluated the patient, participating in the key portions of the  service and reviewing pertinent diagnostic studies and records.  I reviewed the nurse practitioner's note and agree with the findings and the plan.  The assessment and plan were discussed with the patient. Multiple questions were asked by the patient and answered.  Nolon Stalls, MD 01/14/2018,11:02 AM

## 2018-01-14 ENCOUNTER — Inpatient Hospital Stay: Payer: Medicare Other

## 2018-01-14 ENCOUNTER — Encounter: Payer: Self-pay | Admitting: Hematology and Oncology

## 2018-01-14 ENCOUNTER — Other Ambulatory Visit: Payer: Self-pay

## 2018-01-14 ENCOUNTER — Inpatient Hospital Stay (HOSPITAL_BASED_OUTPATIENT_CLINIC_OR_DEPARTMENT_OTHER): Payer: Medicare Other | Admitting: Hematology and Oncology

## 2018-01-14 VITALS — BP 158/84 | HR 64 | Temp 95.7°F | Wt 163.8 lb

## 2018-01-14 DIAGNOSIS — R52 Pain, unspecified: Secondary | ICD-10-CM | POA: Diagnosis not present

## 2018-01-14 DIAGNOSIS — E538 Deficiency of other specified B group vitamins: Secondary | ICD-10-CM | POA: Diagnosis not present

## 2018-01-14 DIAGNOSIS — I2510 Atherosclerotic heart disease of native coronary artery without angina pectoris: Secondary | ICD-10-CM

## 2018-01-14 DIAGNOSIS — L299 Pruritus, unspecified: Secondary | ICD-10-CM | POA: Diagnosis not present

## 2018-01-14 DIAGNOSIS — E1142 Type 2 diabetes mellitus with diabetic polyneuropathy: Secondary | ICD-10-CM

## 2018-01-14 DIAGNOSIS — Z5111 Encounter for antineoplastic chemotherapy: Secondary | ICD-10-CM | POA: Diagnosis not present

## 2018-01-14 DIAGNOSIS — C642 Malignant neoplasm of left kidney, except renal pelvis: Secondary | ICD-10-CM

## 2018-01-14 DIAGNOSIS — Z7189 Other specified counseling: Secondary | ICD-10-CM

## 2018-01-14 DIAGNOSIS — C778 Secondary and unspecified malignant neoplasm of lymph nodes of multiple regions: Secondary | ICD-10-CM

## 2018-01-14 DIAGNOSIS — Z5189 Encounter for other specified aftercare: Secondary | ICD-10-CM | POA: Diagnosis not present

## 2018-01-14 LAB — CBC WITH DIFFERENTIAL/PLATELET
Basophils Absolute: 0.1 10*3/uL (ref 0–0.1)
Basophils Relative: 2 %
Eosinophils Absolute: 0.1 10*3/uL (ref 0–0.7)
Eosinophils Relative: 2 %
HCT: 28.2 % — ABNORMAL LOW (ref 40.0–52.0)
Hemoglobin: 9.8 g/dL — ABNORMAL LOW (ref 13.0–18.0)
Lymphocytes Relative: 24 %
Lymphs Abs: 1 10*3/uL (ref 1.0–3.6)
MCH: 30.9 pg (ref 26.0–34.0)
MCHC: 34.6 g/dL (ref 32.0–36.0)
MCV: 89.4 fL (ref 80.0–100.0)
Monocytes Absolute: 0.7 10*3/uL (ref 0.2–1.0)
Monocytes Relative: 17 %
Neutro Abs: 2.3 10*3/uL (ref 1.4–6.5)
Neutrophils Relative %: 55 %
Platelets: 202 10*3/uL (ref 150–440)
RBC: 3.15 MIL/uL — ABNORMAL LOW (ref 4.40–5.90)
RDW: 15.7 % — ABNORMAL HIGH (ref 11.5–14.5)
WBC: 4.1 10*3/uL (ref 3.8–10.6)

## 2018-01-14 LAB — BASIC METABOLIC PANEL
Anion gap: 6 (ref 5–15)
BUN: 12 mg/dL (ref 8–23)
CO2: 26 mmol/L (ref 22–32)
Calcium: 9.2 mg/dL (ref 8.9–10.3)
Chloride: 104 mmol/L (ref 98–111)
Creatinine, Ser: 0.6 mg/dL — ABNORMAL LOW (ref 0.61–1.24)
GFR calc Af Amer: >60 mL/min (ref >60)
GFR calc non Af Amer: >60 mL/min (ref >60)
Glucose, Bld: 172 mg/dL — ABNORMAL HIGH (ref 70–99)
Potassium: 3.9 mmol/L (ref 3.5–5.1)
Sodium: 136 mmol/L (ref 135–145)

## 2018-01-14 LAB — HEPATIC FUNCTION PANEL
ALK PHOS: 75 U/L (ref 38–126)
ALT: 16 U/L (ref 0–44)
AST: 20 U/L (ref 15–41)
Albumin: 3.5 g/dL (ref 3.5–5.0)
Total Bilirubin: 0.8 mg/dL (ref 0.3–1.2)
Total Protein: 6.6 g/dL (ref 6.5–8.1)

## 2018-01-14 LAB — MAGNESIUM: Magnesium: 1.8 mg/dL (ref 1.7–2.4)

## 2018-01-14 MED ORDER — DEXAMETHASONE SODIUM PHOSPHATE 10 MG/ML IJ SOLN
10.0000 mg | Freq: Once | INTRAMUSCULAR | Status: AC
  Administered 2018-01-14: 10 mg via INTRAVENOUS
  Filled 2018-01-14: qty 1

## 2018-01-14 MED ORDER — CARBOPLATIN CHEMO INJECTION 450 MG/45ML
390.0000 mg | Freq: Once | INTRAVENOUS | Status: AC
  Administered 2018-01-14: 390 mg via INTRAVENOUS
  Filled 2018-01-14: qty 39

## 2018-01-14 MED ORDER — PALONOSETRON HCL INJECTION 0.25 MG/5ML
0.2500 mg | Freq: Once | INTRAVENOUS | Status: AC
Start: 2018-01-14 — End: 2018-01-14
  Administered 2018-01-14: 0.25 mg via INTRAVENOUS
  Filled 2018-01-14: qty 5

## 2018-01-14 MED ORDER — SODIUM CHLORIDE 0.9 % IV SOLN
1600.0000 mg | Freq: Once | INTRAVENOUS | Status: AC
  Administered 2018-01-14: 1600 mg via INTRAVENOUS
  Filled 2018-01-14: qty 26.3

## 2018-01-14 MED ORDER — HEPARIN SOD (PORK) LOCK FLUSH 100 UNIT/ML IV SOLN: 500.0000 [IU] | Freq: Once | INTRAVENOUS | Status: DC

## 2018-01-14 MED ORDER — SODIUM CHLORIDE 0.9 % IV SOLN
Freq: Once | INTRAVENOUS | Status: AC
  Administered 2018-01-14: 12:00:00 via INTRAVENOUS
  Filled 2018-01-14: qty 1000

## 2018-01-14 MED ORDER — SODIUM CHLORIDE 0.9% FLUSH
10.0000 mL | INTRAVENOUS | Status: DC | PRN
  Administered 2018-01-14: 10 mL via INTRAVENOUS
  Filled 2018-01-14: qty 10

## 2018-01-14 MED ORDER — HEPARIN SOD (PORK) LOCK FLUSH 100 UNIT/ML IV SOLN
500.0000 [IU] | Freq: Once | INTRAVENOUS | Status: AC | PRN
  Administered 2018-01-14: 500 [IU]

## 2018-01-15 ENCOUNTER — Other Ambulatory Visit: Payer: Self-pay | Admitting: Hematology and Oncology

## 2018-01-18 DIAGNOSIS — C642 Malignant neoplasm of left kidney, except renal pelvis: Secondary | ICD-10-CM | POA: Diagnosis not present

## 2018-01-18 DIAGNOSIS — Z515 Encounter for palliative care: Secondary | ICD-10-CM | POA: Diagnosis not present

## 2018-01-20 NOTE — Progress Notes (Addendum)
[facility] day:  01/21/2018  Chief Complaint: Patrick Mcgee is a 72 y.o. male with metastatic urothelial carcinoma who is seen for assessment prior to day 8 of cycle #2  gemcitabine.  HPI:  Patrick Mcgee patient was last seen in the medical oncology clinic on 01/14/2018.  At that time, patient noted that his last treatment really "zapped him". Patient complained of diffuse pruritis. He was taking oatmeal baths, which were helping. No nausea or vomiting. Pain well controlled. He had been off of his Duragesic patches for over a week.  Exam was stable.  Hemoglobin  9.8.  Platelet count 202,000. He received day 1 of cycle #2 carboplatin + gemcitabine.   In the interim, patient has been "hanging in there". Patient notes that yesterday has been the best day that he has had in quite awhile. He denies nausea and vomiting. His bowels are "about the same". Patient anxious about today's treatment citing that the last 2 treatments have "zapped" him.   Patient denies that he has experienced any B symptoms. He denies any interval infections. Patient advises that he maintains an adequate appetite. He is eating "a lot more junk" per his report. Weight today is 162 lb 6 oz (73.7 kg), which compared to his last visit to the clinic, represents a 1 pound decrease  Patient denies pain in the clinic today. Patient's pain has come under remarkable control. He is only using APAP on a PRN basis at this point. He has not required the use of opioid pain medications over the course of the last couple of weeks.   Patient has a scheduled family trip coming up between 04/29/18 - 05/06/2018. Wife is asking that his treatment schedule be amended as needed to accommodate their travel plans to [facility].    Past Medical History:  Diagnosis Date  . Acid reflux   . Anxiety   . Cancer ([place])    renal mass  . Depression   . Diabetes mellitus without complication ([place])   . Hyperlipidemia   .  Hypertension   . Myocardial infarction ([place]) 1995  . Sleep apnea     Past Surgical History:  Procedure Laterality Date  . CATARACT EXTRACTION Left   . COLONOSCOPY  2010   [facility]  . COLONOSCOPY WITH PROPOFOL N/A 10/04/2016   Procedure: COLONOSCOPY WITH PROPOFOL;  Surgeon: [name], MD;  Location: [facility] ENDOSCOPY;  Service: Endoscopy;  Laterality: N/A;  . [facility], 2000, 2001, 2014  . CYSTOSCOPY W/ RETROGRADES Bilateral 12/10/2017   Procedure: CYSTOSCOPY WITH RETROGRADE PYELOGRAM;  Surgeon: [name], MD;  Location: [facility] ORS;  Service: Urology;  Laterality: Bilateral;  . CYSTOSCOPY WITH STENT PLACEMENT Left 12/10/2017   Procedure: CYSTOSCOPY WITH STENT PLACEMENT;  Surgeon: [name], MD;  Location: [facility] ORS;  Service: Urology;  Laterality: Left;  . INGUINAL HERNIA REPAIR Right 08/09/2015   Procedure: HERNIA REPAIR INGUINAL ADULT;  Surgeon: [name], MD;  Location: [facility] ORS;  Service: General;  Laterality: Right;  . NASAL SINUS SURGERY    . nuclear stress test    . PORTA CATH INSERTION N/A 12/19/2017   Procedure: PORTA CATH INSERTION;  Surgeon: [name], MD;  Location: [facility];  Service: Cardiovascular;  Laterality: N/A;  . URETERAL BIOPSY Left 12/10/2017   Procedure: URETERAL & renal PELVIS BIOPSY;  Surgeon: [name], MD;  Location: [facility] ORS;  Service: Urology;  Laterality: Left;  .  URETEROSCOPY Left 12/10/2017   Procedure: URETEROSCOPY;  Surgeon: [name], MD;  Location: [facility] ORS;  Service: Urology;  Laterality: Left;    Family History  Problem Relation Age of Onset  . Heart disease Mother   . Cancer Father        Lung and colon cancer  . Heart disease Father   . Emphysema Maternal Grandfather   . Tuberculosis Maternal Grandmother     Social History:  reports that he has never smoked. He has quit using smokeless tobacco. His smokeless tobacco use included chew. He reports that he drinks  about 0.6 - 3.0 oz of alcohol per week. He reports that he does not use drugs.  Patient is retired from the [employer]. Patient denies known exposures to radiation on toxins. The patient is accompanied by his wife, Patrick Mcgee, today.  Allergies:  Allergies  Allergen Reactions  . Sulfa Antibiotics Other (See Comments)    Joint pain  . Ace Inhibitors Cough  . Invokana [Canagliflozin] Other (See Comments)    Leg pain  . Penicillins Rash    Has patient had a PCN reaction causing immediate rash, facial/tongue/throat swelling, SOB or lightheadedness with hypotension: Yes Has patient had a PCN reaction causing severe rash involving mucus membranes or skin necrosis: No Has patient had a PCN reaction that required hospitalization: No Has patient had a PCN reaction occurring within the last 10 years: No If all of the above answers are "NO", then may proceed with Cephalosporin use.    Current Medications: Current Outpatient Medications  Medication Sig Dispense Refill  . acetaminophen (TYLENOL) 500 MG tablet Take 500 mg by mouth every 6 (six) hours as needed for moderate pain. When taking oxycodone 5 mg tablets    . ALPRAZolam (XANAX) 0.25 MG tablet Take 1 tablet (0.25 mg total) by mouth 3 (three) times daily as needed for anxiety. 20 tablet 0  . atorvastatin (LIPITOR) 40 MG tablet TAKE ONE TABLET BY MOUTH ONCE DAILY (Patient taking differently: TAKE ONE TABLET BY MOUTH DAILY AT BEDTIME) 90 tablet 3  . carvedilol (COREG) 12.5 MG tablet TAKE ONE TABLET BY MOUTH TWICE DAILY 180 tablet 3  . clobetasol cream (TEMOVATE) 3.38 % Apply 1 application topically as needed (for skin rash). 30 g 1  . doxepin (SINEQUAN) 10 MG capsule Take 1 capsule (10 mg total) by mouth 4 (four) times daily as needed. 120 capsule 5  . feeding supplement, ENSURE ENLIVE, (ENSURE ENLIVE) LIQD Take 237 mLs by mouth 3 (three) times daily between meals. 90 Bottle 0  . fentaNYL (DURAGESIC - DOSED MCG/HR) 25 MCG/HR patch Place 1 patch (25  mcg total) onto the skin every 3 (three) days. 10 patch 0  . fluticasone (FLONASE) 50 MCG/ACT nasal spray Place 2 sprays into both nostrils daily. (Patient taking differently: Place 2 sprays into both nostrils daily as needed for allergies or rhinitis. ) 16 g 12  . glucose blood (CONTOUR NEXT TEST) test strip Checks sugar 4 times daily. DX E11.9-strips for Contour next EZ meter 360 each 3  . HYDROmorphone (DILAUDID) 2 MG tablet Take 1 tablet (2 mg total) by mouth every 4 (four) hours as needed for severe pain (breakthrough pain). 40 tablet 0  . Ivermectin (SOOLANTRA) 1 % CREA Apply topically at bedtime. To face    . lactulose (CEPHULAC) 20 g packet Take 1 packet (20 g total) by mouth 2 (two) times daily as needed (constipation). 60 each 0  . lidocaine-prilocaine (EMLA) cream Apply 1 application topically  as needed. 30 g 3  . linaclotide (LINZESS) 290 MCG CAPS capsule Take 1 capsule (290 mcg total) by mouth daily before breakfast. 30 capsule 0  . losartan (COZAAR) 100 MG tablet TAKE ONE TABLET BY MOUTH ONCE DAILY (Patient taking differently: TAKE ONE TABLET BY MOUTH DAILY AT BEDTIME) 90 tablet 3  . meclizine (ANTIVERT) 25 MG tablet Take 25 mg by mouth 3 (three) times daily as needed for dizziness.    . metFORMIN (GLUCOPHAGE) 1000 MG tablet Take 1 tablet (1,000 mg total) by mouth 2 (two) times daily with a meal. 180 tablet 3  . nystatin cream (MYCOSTATIN) APPLY  CREAM TOPICALLY TWICE DAILY AROUND  THE  GROIN  AREA (Patient taking differently: APPLY  CREAM TOPICALLY TWICE DAILY AROUND  THE  GROIN  AREA AS NEEDED FOR SKIN RASH) 15 g 1  . ondansetron (ZOFRAN) 8 MG tablet Take 1 tablet (8 mg total) by mouth every 8 (eight) hours as needed for nausea or vomiting (if needed beginning 3 days after chemotherapy). 20 tablet 0  . oxyCODONE (OXYCONTIN) 15 mg 12 hr tablet Take 1 tablet (15 mg total) by mouth every 12 (twelve) hours. 20 tablet 0  . polyethylene glycol (MIRALAX) packet Take 17 g by mouth 2 (two) times  daily. (Patient taking differently: Take 17 g by mouth 2 (two) times daily as needed for moderate constipation. ) 14 each 0  . sodium chloride 1 g tablet Take 1 tablet (1 g total) by mouth 3 (three) times daily with meals. 15 tablet 0  . tamsulosin (FLOMAX) 0.4 MG CAPS capsule Take 1 capsule (0.4 mg total) by mouth daily. 30 capsule 0   No current facility-administered medications for this visit.     Review of Systems  Constitutional: Positive for malaise/fatigue ("zapped" after treatments) and weight loss (down 1 pound). Negative for diaphoresis and fever.       "I'm hanging in there".   HENT: Negative.   Eyes: Negative.   Respiratory: Negative for cough, hemoptysis, sputum production and shortness of breath.   Cardiovascular: Negative for chest pain, palpitations, orthopnea, leg swelling and PND.       MI s/p angioplasty with placement of 5-6 stents (last in 2015). Blood pressure managed on 3 antihypertensive medications.   Gastrointestinal: Positive for constipation (uses OTC interventions; effective) and heartburn. Negative for abdominal pain, blood in stool, diarrhea, melena, nausea and vomiting.  Genitourinary: Negative for dysuria, frequency, hematuria and urgency.  Musculoskeletal: Positive for back pain (aching). Negative for falls, joint pain and myalgias.  Skin: Positive for itching (diffuse; prone to itching) and rash (recurrent intertriginous (sub-pannus) - using topicals PRN).  Neurological: Positive for tingling (diabetic neuropathy in feet) and weakness (generalized). Negative for dizziness, tremors and headaches.  Endo/Heme/Allergies: Positive for environmental allergies (seasonal allergies). Does not bruise/bleed easily.       T2DM  Psychiatric/Behavioral: Negative for depression, memory loss and suicidal ideas. The patient is nervous/anxious. The patient does not have insomnia.   All other systems reviewed and are negative.  Performance status (ECOG): 2 - Symptomatic, <50%  confined to bed  Vital signs BP 137/87 (BP Location: Left Arm, Patient Position: Sitting)   Pulse 62   Temp (!) 94.1 F (34.5 C) (Tympanic)   Resp 18   Wt 162 lb 6 oz (73.7 kg)   BMI 24.69 kg/m   Physical Exam  Constitutional: He is oriented to person, place, and time and well-developed, well-nourished, and in no distress.  HENT:  Head: Normocephalic and  atraumatic.  Thin grey hair  Eyes: Pupils are equal, round, and reactive to light. EOM are normal. No scleral icterus.  Brown eyes  Neck: Normal range of motion. Neck supple. No tracheal deviation present. No thyromegaly present.  Cardiovascular: Normal rate, regular rhythm and normal heart sounds. Exam reveals no gallop and no friction rub.  No murmur heard. Pulmonary/Chest: Effort normal and breath sounds normal. No respiratory distress. He has no wheezes. He has no rales.  Abdominal: Soft. Bowel sounds are normal. He exhibits no distension. There is no tenderness.  Musculoskeletal: Normal range of motion. He exhibits no edema or tenderness.  Lymphadenopathy:    He has no cervical adenopathy.    He has no axillary adenopathy.       Right: No inguinal and no supraclavicular adenopathy present.       Left: No inguinal and no supraclavicular adenopathy present.  Neurological: He is alert and oriented to person, place, and time.  Skin: Skin is warm and dry. No rash noted. No erythema. There is pallor.  Psychiatric: Mood, affect and judgment normal.  Nursing note and vitals reviewed.   Infusion on 01/21/2018  Component Date Value Ref Range Status  . Magnesium 01/21/2018 1.9  1.7 - 2.4 mg/dL Final   Performed at [facility], [address], [place], [place] [address]  . WBC 01/21/2018 2.6* 3.8 - 10.6 K/uL Final  . RBC 01/21/2018 3.10* 4.40 - 5.90 MIL/uL Final  . Hemoglobin 01/21/2018 9.6* 13.0 - 18.0 g/dL Final  . HCT 01/21/2018 28.0* 40.0 - 52.0 % Final  . MCV 01/21/2018 90.4  80.0 - 100.0 fL Final  . MCH 01/21/2018  31.0  26.0 - 34.0 pg Final  . MCHC 01/21/2018 34.3  32.0 - 36.0 g/dL Final  . RDW 01/21/2018 16.5* 11.5 - 14.5 % Final  . Platelets 01/21/2018 204  150 - 440 K/uL Final  . Neutrophils Relative % 01/21/2018 50  % Final  . Neutro Abs 01/21/2018 1.3* 1.4 - 6.5 K/uL Final  . Lymphocytes Relative 01/21/2018 38  % Final  . Lymphs Abs 01/21/2018 1.0  1.0 - 3.6 K/uL Final  . Monocytes Relative 01/21/2018 8  % Final  . Monocytes Absolute 01/21/2018 0.2  0.2 - 1.0 K/uL Final  . Eosinophils Relative 01/21/2018 3  % Final  . Eosinophils Absolute 01/21/2018 0.1  0 - 0.7 K/uL Final  . Basophils Relative 01/21/2018 1  % Final  . Basophils Absolute 01/21/2018 0.0  0 - 0.1 K/uL Final   Performed at [facility], [address] [place], [place] [address]  . Sodium 01/21/2018 139  135 - 145 mmol/L Final  . Potassium 01/21/2018 3.8  3.5 - 5.1 mmol/L Final  . Chloride 01/21/2018 105  98 - 111 mmol/L Final   Please note change in reference range.  . CO2 01/21/2018 26  22 - 32 mmol/L Final  . Glucose, Bld 01/21/2018 162* 70 - 99 mg/dL Final   Please note change in reference range.  . BUN 01/21/2018 14  8 - 23 mg/dL Final   Please note change in reference range.  . Creatinine, Ser 01/21/2018 0.58* 0.61 - 1.24 mg/dL Final  . Calcium 01/21/2018 9.1  8.9 - 10.3 mg/dL Final  . Total Protein 01/21/2018 6.6  6.5 - 8.1 g/dL Final  . Albumin 01/21/2018 3.6  3.5 - 5.0 g/dL Final  . AST 01/21/2018 23  15 - 41 U/L Final  . ALT 01/21/2018 18  0 - 44 U/L Final  Please note change in reference range.  . Alkaline Phosphatase 01/21/2018 73  38 - 126 U/L Final  . Total Bilirubin 01/21/2018 0.8  0.3 - 1.2 mg/dL Final  . GFR calc non Af Amer 01/21/2018 >60  >60 mL/min Final  . GFR calc Af Amer 01/21/2018 >60  >60 mL/min Final   Comment: (NOTE) The eGFR has been calculated using the CKD EPI equation. This calculation has not been validated in all clinical situations. eGFR's persistently <60 mL/min signify  possible Chronic Kidney Disease.   Georgiann Hahn gap 01/21/2018 8  5 - 15 Final   Performed at [facility], [address] [place], [place] [address]    Assessment:  Patrick Mcgee is a 72 y.o. 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  He presented with abdominal pain and weight loss.  Abdomen and pelvic CT on 12/01/2017 revealed a suspicious enhancing, filling defect involving the lower pole collecting system of the left kidney with asymmetric hypoenhancement of the inferior pole cortex of left kidney noted. Findings were worrisome for urothelial neoplasm.  There was abdominal and pelvic adenopathy compatible with nodal metastasis.  There was a 2.6 cm right para celiac node, 1,6 cm left retroperitoneal node, 1.4 cm right retroperitoneal node, 2.1 cm right common iliac node and 1 cm left external iliac node.  There was aortic atherosclerosis, kidney cysts, and prostate gland enlargement.  Chest CT on 12/07/2017 revealed no evidence of metastatic disease.    He underwent left ureteroscopy with renal pelvis biopsy and left ureteral stent placement on 12/10/2017.  Findings included a large nodular, vascular tumor within the left lower pole collecting system completely obstructing the entire left lower pole moiety.  Ureter, right kidney, and bladder was normal.  Pathology revealed small fragments of high-grade urothelial carcinoma with a small focus of invasion.  He is s/p day 1 of cycle #2 carboplatin + gemcitabine (12/21/2017 - 01/14/2018). He is tolerating treatment well.   He has B12 deficiency.  B12 was 247 on 12/31/2017.  He began oral B12 on 01/07/2018.  He has diabetes and coronary artery disease s/p MI in 1995.  He is s/p stent placement x 5-6.  Colonoscopy in 2018 was negative.  Symptomatically, patient is doing well overall. He notes some fatigue following his treatments. No nausea, vomiting, or diarrhea. He denies B symptoms. He has  constipation that is controlled with OTC interventions. Pain is well controlled with APAP only. Exam reveals that the patient is pale in color.  WBC 2600 (ANC 1300). Hemoglobin is 9.6.  Platelet count is 204,000. Renal and hepatic function normal.    Plan: 1. Labs today:  CBC with diff, CMP, Mg 2. Labs reviewed. Blood counts stable and adequate enough for treatment. Will proceed with day 8 of cycle #2 gemcitabine.  3. Review plans for advancing chemotherapy dosing:  Initial 2 cycles of chemotherapy have been tolerated well thus far, however patient only receiving 80% gemcitabine dose.    Discuss pre-treatment counts as compared to those following a treatment cycle with 80% dosing.   Dicussed the potential need for GCSF to support declining post-treatment counts.   Will maintain dose with this cycle and consider advancing dose with cycle #3 if tolerated.  4. Discuss protein calorie malnutrition. Patient notes a good appetite, and he reportedly is making calorie dense food choices. BMI of 24.69 kg/m places patient in a normal weight range. Encouraged him to continue to increase his intake of calorie  and protein dense food choices. He is scheduled to meet with the cancer center RD today to discuss strategies to prevent further decline in his weight.  5. Discuss previously low magnesium. Magnesium stable today at 1.9. Continue Mag-ox 400 mg daily at home.  6. Discuss symptom management.  Patient has antiemetics and pain medications at home to use on a PRN basis. Patient  advising that the  prescribed interventions are adequate at this point. Continue all medications as previously prescribed.  7. Discuss palliative care. Patient being followed by outpatient palliative medicine team. No immediate needs at this time. Will continue to coordinate care with palliative medicine team.  8. Discuss chemotherapy induced neutropenia. Anticipate further decline in the patient's counts following his treatment  today. Pre-emptively discussed neutropenic precautions, including need to avoid sick contacts and maintain strict handwashing. Will have patient RTC on 01/23/2018, 01/25/2018, and 01/28/2018 for labs (CBC with diff) and +/- GCSF.  9. RTC on 02/04/2018 for MD assessment, labs (CBC with diff, CMP, Mg), and day 1 of cycle #3 carboplatin + gemcitabine.  Honor Loh, NP  01/21/2018, 6:09 PM

## 2018-01-21 ENCOUNTER — Inpatient Hospital Stay: Payer: Medicare Other

## 2018-01-21 ENCOUNTER — Inpatient Hospital Stay (HOSPITAL_BASED_OUTPATIENT_CLINIC_OR_DEPARTMENT_OTHER): Payer: Medicare Other | Admitting: Urgent Care

## 2018-01-21 VITALS — BP 137/87 | HR 62 | Temp 94.1°F | Resp 18 | Wt 162.4 lb

## 2018-01-21 DIAGNOSIS — T451X5A Adverse effect of antineoplastic and immunosuppressive drugs, initial encounter: Secondary | ICD-10-CM

## 2018-01-21 DIAGNOSIS — D701 Agranulocytosis secondary to cancer chemotherapy: Secondary | ICD-10-CM

## 2018-01-21 DIAGNOSIS — I2510 Atherosclerotic heart disease of native coronary artery without angina pectoris: Secondary | ICD-10-CM | POA: Diagnosis not present

## 2018-01-21 DIAGNOSIS — R634 Abnormal weight loss: Secondary | ICD-10-CM | POA: Diagnosis not present

## 2018-01-21 DIAGNOSIS — Z5111 Encounter for antineoplastic chemotherapy: Secondary | ICD-10-CM | POA: Diagnosis not present

## 2018-01-21 DIAGNOSIS — E538 Deficiency of other specified B group vitamins: Secondary | ICD-10-CM

## 2018-01-21 DIAGNOSIS — C778 Secondary and unspecified malignant neoplasm of lymph nodes of multiple regions: Secondary | ICD-10-CM | POA: Diagnosis not present

## 2018-01-21 DIAGNOSIS — C642 Malignant neoplasm of left kidney, except renal pelvis: Secondary | ICD-10-CM | POA: Diagnosis not present

## 2018-01-21 DIAGNOSIS — E1142 Type 2 diabetes mellitus with diabetic polyneuropathy: Secondary | ICD-10-CM

## 2018-01-21 DIAGNOSIS — E440 Moderate protein-calorie malnutrition: Secondary | ICD-10-CM | POA: Diagnosis not present

## 2018-01-21 DIAGNOSIS — Z7189 Other specified counseling: Secondary | ICD-10-CM

## 2018-01-21 DIAGNOSIS — K5900 Constipation, unspecified: Secondary | ICD-10-CM

## 2018-01-21 DIAGNOSIS — Z5189 Encounter for other specified aftercare: Secondary | ICD-10-CM | POA: Diagnosis not present

## 2018-01-21 DIAGNOSIS — C689 Malignant neoplasm of urinary organ, unspecified: Secondary | ICD-10-CM

## 2018-01-21 LAB — COMPREHENSIVE METABOLIC PANEL
ALT: 18 U/L (ref 0–44)
AST: 23 U/L (ref 15–41)
Albumin: 3.6 g/dL (ref 3.5–5.0)
Alkaline Phosphatase: 73 U/L (ref 38–126)
Anion gap: 8 (ref 5–15)
BUN: 14 mg/dL (ref 8–23)
CO2: 26 mmol/L (ref 22–32)
Calcium: 9.1 mg/dL (ref 8.9–10.3)
Chloride: 105 mmol/L (ref 98–111)
Creatinine, Ser: 0.58 mg/dL — ABNORMAL LOW (ref 0.61–1.24)
GFR calc Af Amer: >60 mL/min (ref >60)
GFR calc non Af Amer: >60 mL/min (ref >60)
Glucose, Bld: 162 mg/dL — ABNORMAL HIGH (ref 70–99)
Potassium: 3.8 mmol/L (ref 3.5–5.1)
Sodium: 139 mmol/L (ref 135–145)
Total Bilirubin: 0.8 mg/dL (ref 0.3–1.2)
Total Protein: 6.6 g/dL (ref 6.5–8.1)

## 2018-01-21 LAB — CBC WITH DIFFERENTIAL/PLATELET
Basophils Absolute: 0 10*3/uL (ref 0–0.1)
Basophils Relative: 1 %
Eosinophils Absolute: 0.1 10*3/uL (ref 0–0.7)
Eosinophils Relative: 3 %
HCT: 28 % — ABNORMAL LOW (ref 40.0–52.0)
Hemoglobin: 9.6 g/dL — ABNORMAL LOW (ref 13.0–18.0)
Lymphocytes Relative: 38 %
Lymphs Abs: 1 10*3/uL (ref 1.0–3.6)
MCH: 31 pg (ref 26.0–34.0)
MCHC: 34.3 g/dL (ref 32.0–36.0)
MCV: 90.4 fL (ref 80.0–100.0)
Monocytes Absolute: 0.2 10*3/uL (ref 0.2–1.0)
Monocytes Relative: 8 %
Neutro Abs: 1.3 10*3/uL — ABNORMAL LOW (ref 1.4–6.5)
Neutrophils Relative %: 50 %
Platelets: 204 10*3/uL (ref 150–440)
RBC: 3.1 MIL/uL — ABNORMAL LOW (ref 4.40–5.90)
RDW: 16.5 % — ABNORMAL HIGH (ref 11.5–14.5)
WBC: 2.6 10*3/uL — ABNORMAL LOW (ref 3.8–10.6)

## 2018-01-21 LAB — MAGNESIUM: Magnesium: 1.9 mg/dL (ref 1.7–2.4)

## 2018-01-21 MED ORDER — SODIUM CHLORIDE 0.9 % IV SOLN
Freq: Once | INTRAVENOUS | Status: AC
  Administered 2018-01-21: 10:00:00 via INTRAVENOUS
  Filled 2018-01-21: qty 4

## 2018-01-21 MED ORDER — SODIUM CHLORIDE 0.9 % IV SOLN
Freq: Once | INTRAVENOUS | Status: AC
  Administered 2018-01-21: 10:00:00 via INTRAVENOUS
  Filled 2018-01-21: qty 1000

## 2018-01-21 MED ORDER — DEXAMETHASONE SODIUM PHOSPHATE 10 MG/ML IJ SOLN
10.0000 mg | Freq: Once | INTRAMUSCULAR | Status: AC
  Administered 2018-01-21: 10 mg via INTRAVENOUS
  Filled 2018-01-21: qty 1

## 2018-01-21 MED ORDER — SODIUM CHLORIDE 0.9 % IV SOLN
1600.0000 mg | Freq: Once | INTRAVENOUS | Status: AC
  Administered 2018-01-21: 1600 mg via INTRAVENOUS
  Filled 2018-01-21: qty 26.3

## 2018-01-21 MED ORDER — SODIUM CHLORIDE 0.9% FLUSH
10.0000 mL | Freq: Once | INTRAVENOUS | Status: AC
  Administered 2018-01-21: 10 mL via INTRAVENOUS
  Filled 2018-01-21: qty 10

## 2018-01-21 MED ORDER — HEPARIN SOD (PORK) LOCK FLUSH 100 UNIT/ML IV SOLN
500.0000 [IU] | Freq: Once | INTRAVENOUS | Status: AC
  Administered 2018-01-21: 500 [IU] via INTRAVENOUS
  Filled 2018-01-21: qty 5

## 2018-01-21 NOTE — Progress Notes (Signed)
Patient states he had a little nausea this morning but thinks it was more indigestion.  No other complaints today.

## 2018-01-21 NOTE — Progress Notes (Signed)
Nutrition Follow-up:  Patient with metastatic urothelial cancer currently on chemotherapy.    Met with patient and wife today during infusion.  Patient reports appetite is good. Has not been drinking oral nutrition supplements on regular basis.  "I have just been trying to eat."  Constipation is controlled with medications and reports has bowel movement about everyday.  "I have seen lots of dietitians in the past but don't always do what they say. "    Medications: reviewed  Labs: reviewed  Anthropometrics:   Weight 162 lb today slight decrease from 163 lb on 7/8.    Wife reports that patient is fearful that he will gain weight.  Prior to diagnosis was following nutrisystem to loose weight  NUTRITION DIAGNOSIS: Inadequate oral intake stable   INTERVENTION:  Reviewed importance of good nutrition and calorie needs at this time.   Encouraged patient to liberalize diet for added calories and protein. RD will follow at a distance at this time per patient preference.      MONITORING, EVALUATION, GOAL: Patient will consume adequate calories and protein to meet nutritional needsd and prevent further weight loss   NEXT VISIT: as needed  Patrick Mcgee B. Zenia Resides, [place], [place] Registered Dietitian [phone] (pager)

## 2018-01-21 NOTE — Progress Notes (Signed)
ANC 1.3. Per Honor Loh NP, proceed with treatment today

## 2018-01-22 DIAGNOSIS — D701 Agranulocytosis secondary to cancer chemotherapy: Secondary | ICD-10-CM | POA: Insufficient documentation

## 2018-01-22 DIAGNOSIS — T451X5A Adverse effect of antineoplastic and immunosuppressive drugs, initial encounter: Secondary | ICD-10-CM

## 2018-01-23 ENCOUNTER — Inpatient Hospital Stay: Payer: Medicare Other

## 2018-01-23 ENCOUNTER — Telehealth: Payer: Self-pay | Admitting: *Deleted

## 2018-01-23 DIAGNOSIS — C778 Secondary and unspecified malignant neoplasm of lymph nodes of multiple regions: Secondary | ICD-10-CM | POA: Diagnosis not present

## 2018-01-23 DIAGNOSIS — C642 Malignant neoplasm of left kidney, except renal pelvis: Secondary | ICD-10-CM | POA: Diagnosis not present

## 2018-01-23 DIAGNOSIS — Z5111 Encounter for antineoplastic chemotherapy: Secondary | ICD-10-CM | POA: Diagnosis not present

## 2018-01-23 DIAGNOSIS — Z5189 Encounter for other specified aftercare: Secondary | ICD-10-CM | POA: Diagnosis not present

## 2018-01-23 LAB — BASIC METABOLIC PANEL
Anion gap: 7 (ref 5–15)
BUN: 13 mg/dL (ref 8–23)
CO2: 26 mmol/L (ref 22–32)
Calcium: 9.5 mg/dL (ref 8.9–10.3)
Chloride: 106 mmol/L (ref 98–111)
Creatinine, Ser: 0.58 mg/dL — ABNORMAL LOW (ref 0.61–1.24)
GFR calc Af Amer: >60 mL/min (ref >60)
GFR calc non Af Amer: >60 mL/min (ref >60)
Glucose, Bld: 127 mg/dL — ABNORMAL HIGH (ref 70–99)
Potassium: 4.1 mmol/L (ref 3.5–5.1)
Sodium: 139 mmol/L (ref 135–145)

## 2018-01-23 LAB — CBC WITH DIFFERENTIAL/PLATELET
Basophils Absolute: 0 10*3/uL (ref 0–0.1)
Basophils Relative: 1 %
Eosinophils Absolute: 0.1 10*3/uL (ref 0–0.7)
Eosinophils Relative: 2 %
HCT: 27.7 % — ABNORMAL LOW (ref 40.0–52.0)
Hemoglobin: 9.5 g/dL — ABNORMAL LOW (ref 13.0–18.0)
Lymphocytes Relative: 31 %
Lymphs Abs: 1.2 10*3/uL (ref 1.0–3.6)
MCH: 31.2 pg (ref 26.0–34.0)
MCHC: 34.5 g/dL (ref 32.0–36.0)
MCV: 90.6 fL (ref 80.0–100.0)
Monocytes Absolute: 0.2 10*3/uL (ref 0.2–1.0)
Monocytes Relative: 4 %
Neutro Abs: 2.4 10*3/uL (ref 1.4–6.5)
Neutrophils Relative %: 62 %
Platelets: 158 10*3/uL (ref 150–440)
RBC: 3.06 MIL/uL — ABNORMAL LOW (ref 4.40–5.90)
RDW: 16.6 % — ABNORMAL HIGH (ref 11.5–14.5)
WBC: 3.9 10*3/uL (ref 3.8–10.6)

## 2018-01-23 LAB — MAGNESIUM: Magnesium: 1.9 mg/dL (ref 1.7–2.4)

## 2018-01-23 NOTE — Telephone Encounter (Signed)
Patient's wife called to ask if he needs to keep his appointment for lab and possible injection on Friday. He came in today and his lab was ok, and he didn't need injection. He doesn't want to come in if he doesn't have to. Please advise.     dhs

## 2018-01-23 NOTE — Telephone Encounter (Signed)
WBC looked good today. He did not require GCSF injection. He can cancel Friday if he would like. I would still have him to come in next week though so we can get an idea where he is with regards to his counts.   Thanks,  Gaspar Bidding

## 2018-01-23 NOTE — Telephone Encounter (Signed)
Spoke to patient's wife and told her that he could skip Friday's appointment, but to keep his appointment on Monday. She was fine with that. Appointments for lab/injection were canceled for Friday, 7/19.   dhs

## 2018-01-24 ENCOUNTER — Ambulatory Visit: Payer: Self-pay | Admitting: Family Medicine

## 2018-01-25 ENCOUNTER — Inpatient Hospital Stay: Payer: Medicare Other

## 2018-01-28 ENCOUNTER — Inpatient Hospital Stay: Payer: Medicare Other

## 2018-01-28 DIAGNOSIS — C642 Malignant neoplasm of left kidney, except renal pelvis: Secondary | ICD-10-CM

## 2018-01-28 DIAGNOSIS — T451X5A Adverse effect of antineoplastic and immunosuppressive drugs, initial encounter: Principal | ICD-10-CM

## 2018-01-28 DIAGNOSIS — C778 Secondary and unspecified malignant neoplasm of lymph nodes of multiple regions: Secondary | ICD-10-CM | POA: Diagnosis not present

## 2018-01-28 DIAGNOSIS — D701 Agranulocytosis secondary to cancer chemotherapy: Secondary | ICD-10-CM

## 2018-01-28 DIAGNOSIS — Z5111 Encounter for antineoplastic chemotherapy: Secondary | ICD-10-CM | POA: Diagnosis not present

## 2018-01-28 DIAGNOSIS — Z5189 Encounter for other specified aftercare: Secondary | ICD-10-CM | POA: Diagnosis not present

## 2018-01-28 LAB — CBC WITH DIFFERENTIAL/PLATELET
Basophils Absolute: 0 10*3/uL (ref 0–0.1)
Basophils Relative: 1 %
Eosinophils Absolute: 0 10*3/uL (ref 0–0.7)
Eosinophils Relative: 2 %
HCT: 28.3 % — ABNORMAL LOW (ref 40.0–52.0)
Hemoglobin: 9.5 g/dL — ABNORMAL LOW (ref 13.0–18.0)
Lymphocytes Relative: 32 %
Lymphs Abs: 0.9 10*3/uL — ABNORMAL LOW (ref 1.0–3.6)
MCH: 30.8 pg (ref 26.0–34.0)
MCHC: 33.5 g/dL (ref 32.0–36.0)
MCV: 92.1 fL (ref 80.0–100.0)
Monocytes Absolute: 0.5 10*3/uL (ref 0.2–1.0)
Monocytes Relative: 16 %
Neutro Abs: 1.5 10*3/uL (ref 1.4–6.5)
Neutrophils Relative %: 49 %
Platelets: 93 10*3/uL — ABNORMAL LOW (ref 150–440)
RBC: 3.08 MIL/uL — ABNORMAL LOW (ref 4.40–5.90)
RDW: 17.4 % — ABNORMAL HIGH (ref 11.5–14.5)
WBC: 2.9 10*3/uL — ABNORMAL LOW (ref 3.8–10.6)

## 2018-01-28 MED ORDER — TBO-FILGRASTIM 480 MCG/0.8ML ~~LOC~~ SOSY
480.0000 ug | PREFILLED_SYRINGE | Freq: Once | SUBCUTANEOUS | Status: AC
  Administered 2018-01-28: 480 ug via SUBCUTANEOUS

## 2018-02-03 NOTE — Progress Notes (Signed)
[facility] day:  02/04/2018   Chief Complaint: Patrick Mcgee is a 72 y.o. male with metastatic urothelial carcinoma who is seen for assessment prior to day 1 of cycle #3 carboplatin +  gemcitabine.  HPI:  Patrick Mcgee patient was last seen in the medical oncology clinic on 01/21/2018.  At that time, patient was "hanging in there".  He was tolerating treatments well.  He noted some associated fatigue, however denied other symptoms.  Patient was "eating a lot more junk"; weight down 1 pound.  Pain was controlled with APAP only.  Patient had not required the use of opioid pain medications in over 2 weeks.  Exam revealed that the patient was pale in color.  WBC 2600  (ANC 1300).  Hemoglobin 9.6.  He received day 8 of cycle #2 gemcitabine.  CBC on 01/23/2018 revealed a WBC of 3900 with an ANC of 2400.  Hemoglobin 9.5, hematocrit 27.7, MCV 90.6, and platelets 158,000.  Chemistries were unremarkable.  Patient was to return to the clinic on 01/25/2018 for routine lab monitoring +/- GCSF.  Patient's wife called and indicated that they did not want to come to this appointment.  Labs from 01/23/2018 reviewed as stable.  Neutropenic precautions reviewed by triage nurse.  Patient was asked to keep his appointment on 01/28/2018 to follow-up on his counts.  Appointment on0 01/25/2018 was ultimately canceled.  CBC on 01/28/2018 revealed WBC 2900 with an ANC of 1500.  Hemoglobin 9.5, hematocrit 28.3, MCV 92.1, and platelets 93,000. He received GCSF injection.   In the interim, patient is tired today. He notes that he did not sleep last night because he "slept all day yesterday". He denies any nausea, vomiting, or changes to his bowel habits. Pain continues to be controlled. Patient denies that he has experienced any B symptoms. He denies any interval infections. Patient advises that he maintains an adequate appetite. He is eating well. Weight today is 164 lb 9 oz (74.6 kg), which  compared to his last visit to the clinic, represents a 2 pound increase.   Patient denies pain in the clinic today.  Patient has a scheduled family trip coming up between 03/30/2018 - 04/06/2018. Wife is asking that his treatment schedule be amended as needed to accommodate their travel plans to [facility].    Past Medical History:  Diagnosis Date  . Acid reflux   . Anxiety   . Cancer ([place])    renal mass  . Depression   . Diabetes mellitus without complication ([place])   . Hyperlipidemia   . Hypertension   . Myocardial infarction ([place]) 1995  . Sleep apnea     Past Surgical History:  Procedure Laterality Date  . CATARACT EXTRACTION Left   . COLONOSCOPY  2010   [facility]  . COLONOSCOPY WITH PROPOFOL N/A 10/04/2016   Procedure: COLONOSCOPY WITH PROPOFOL;  Surgeon: [name], MD;  Location: [facility] ENDOSCOPY;  Service: Endoscopy;  Laterality: N/A;  . [place], 2000, 2001, 2014  . CYSTOSCOPY W/ RETROGRADES Bilateral 12/10/2017   Procedure: CYSTOSCOPY WITH RETROGRADE PYELOGRAM;  Surgeon: [name], MD;  Location: [facility] ORS;  Service: Urology;  Laterality: Bilateral;  . CYSTOSCOPY WITH STENT PLACEMENT Left 12/10/2017   Procedure: CYSTOSCOPY WITH STENT PLACEMENT;  Surgeon: [name], MD;  Location: [facility] ORS;  Service: Urology;  Laterality: Left;  . INGUINAL HERNIA REPAIR Right 08/09/2015   Procedure: HERNIA REPAIR INGUINAL ADULT;  Surgeon: Forest Gleason  Bary Castilla, MD;  Location: [facility] ORS;  Service: General;  Laterality: Right;  . NASAL SINUS SURGERY    . nuclear stress test    . PORTA CATH INSERTION N/A 12/19/2017   Procedure: PORTA CATH INSERTION;  Surgeon: [name], MD;  Location: [place] CV LAB;  Service: Cardiovascular;  Laterality: N/A;  . URETERAL BIOPSY Left 12/10/2017   Procedure: URETERAL & renal PELVIS BIOPSY;  Surgeon: [name], MD;  Location: [facility] ORS;  Service: Urology;  Laterality: Left;  . URETEROSCOPY Left  12/10/2017   Procedure: URETEROSCOPY;  Surgeon: [name], MD;  Location: [facility] ORS;  Service: Urology;  Laterality: Left;    Family History  Problem Relation Age of Onset  . Heart disease Mother   . Cancer Father        Lung and colon cancer  . Heart disease Father   . Emphysema Maternal Grandfather   . Tuberculosis Maternal Grandmother     Social History:  reports that he has never smoked. He has quit using smokeless tobacco. His smokeless tobacco use included chew. He reports that he drinks about 0.6 - 3.0 oz of alcohol per week. He reports that he does not use drugs.  Patient is retired from the [employer]. Patient denies known exposures to radiation on toxins. The patient has a family trip planned to [facility] between 03/30/2018 - 04/06/2018. The patient is accompanied by his wife, Patrick Mcgee, today.  Allergies:  Allergies  Allergen Reactions  . Sulfa Antibiotics Other (See Comments)    Joint pain  . Ace Inhibitors Cough  . Invokana [Canagliflozin] Other (See Comments)    Leg pain  . Penicillins Rash    Has patient had a PCN reaction causing immediate rash, facial/tongue/throat swelling, SOB or lightheadedness with hypotension: Yes Has patient had a PCN reaction causing severe rash involving mucus membranes or skin necrosis: No Has patient had a PCN reaction that required hospitalization: No Has patient had a PCN reaction occurring within the last 10 years: No If all of the above answers are "NO", then may proceed with Cephalosporin use.    Current Medications: Current Outpatient Medications  Medication Sig Dispense Refill  . acetaminophen (TYLENOL) 500 MG tablet Take 500 mg by mouth every 6 (six) hours as needed for moderate pain. When taking oxycodone 5 mg tablets    . ALPRAZolam (XANAX) 0.25 MG tablet Take 1 tablet (0.25 mg total) by mouth 3 (three) times daily as needed for anxiety. 20 tablet 0  . atorvastatin (LIPITOR) 40 MG tablet TAKE ONE TABLET BY MOUTH ONCE DAILY  (Patient taking differently: TAKE ONE TABLET BY MOUTH DAILY AT BEDTIME) 90 tablet 3  . carvedilol (COREG) 12.5 MG tablet TAKE ONE TABLET BY MOUTH TWICE DAILY 180 tablet 3  . clobetasol cream (TEMOVATE) 6.44 % Apply 1 application topically as needed (for skin rash). 30 g 1  . doxepin (SINEQUAN) 10 MG capsule Take 1 capsule (10 mg total) by mouth 4 (four) times daily as needed. 120 capsule 5  . feeding supplement, ENSURE ENLIVE, (ENSURE ENLIVE) LIQD Take 237 mLs by mouth 3 (three) times daily between meals. 90 Bottle 0  . fentaNYL (DURAGESIC - DOSED MCG/HR) 25 MCG/HR patch Place 1 patch (25 mcg total) onto the skin every 3 (three) days. 10 patch 0  . fluticasone (FLONASE) 50 MCG/ACT nasal spray Place 2 sprays into both nostrils daily. (Patient taking differently: Place 2 sprays into both nostrils daily as needed for allergies or rhinitis. ) 16 g 12  .  glucose blood (CONTOUR NEXT TEST) test strip Checks sugar 4 times daily. DX E11.9-strips for Contour next EZ meter 360 each 3  . HYDROmorphone (DILAUDID) 2 MG tablet Take 1 tablet (2 mg total) by mouth every 4 (four) hours as needed for severe pain (breakthrough pain). 40 tablet 0  . Ivermectin (SOOLANTRA) 1 % CREA Apply topically at bedtime. To face    . lactulose (CEPHULAC) 20 g packet Take 1 packet (20 g total) by mouth 2 (two) times daily as needed (constipation). 60 each 0  . lidocaine-prilocaine (EMLA) cream Apply 1 application topically as needed. 30 g 3  . linaclotide (LINZESS) 290 MCG CAPS capsule Take 1 capsule (290 mcg total) by mouth daily before breakfast. 30 capsule 0  . losartan (COZAAR) 100 MG tablet TAKE ONE TABLET BY MOUTH ONCE DAILY (Patient taking differently: TAKE ONE TABLET BY MOUTH DAILY AT BEDTIME) 90 tablet 3  . meclizine (ANTIVERT) 25 MG tablet Take 25 mg by mouth 3 (three) times daily as needed for dizziness.    . metFORMIN (GLUCOPHAGE) 1000 MG tablet Take 1 tablet (1,000 mg total) by mouth 2 (two) times daily with a meal. 180  tablet 3  . nystatin cream (MYCOSTATIN) APPLY  CREAM TOPICALLY TWICE DAILY AROUND  THE  GROIN  AREA (Patient taking differently: APPLY  CREAM TOPICALLY TWICE DAILY AROUND  THE  GROIN  AREA AS NEEDED FOR SKIN RASH) 15 g 1  . ondansetron (ZOFRAN) 8 MG tablet Take 1 tablet (8 mg total) by mouth every 8 (eight) hours as needed for nausea or vomiting (if needed beginning 3 days after chemotherapy). 20 tablet 0  . oxyCODONE (OXYCONTIN) 15 mg 12 hr tablet Take 1 tablet (15 mg total) by mouth every 12 (twelve) hours. 20 tablet 0  . polyethylene glycol (MIRALAX) packet Take 17 g by mouth 2 (two) times daily. (Patient taking differently: Take 17 g by mouth 2 (two) times daily as needed for moderate constipation. ) 14 each 0  . sodium chloride 1 g tablet Take 1 tablet (1 g total) by mouth 3 (three) times daily with meals. 15 tablet 0  . tamsulosin (FLOMAX) 0.4 MG CAPS capsule Take 1 capsule (0.4 mg total) by mouth daily. 30 capsule 0   No current facility-administered medications for this visit.     Review of Systems  Constitutional: Positive for malaise/fatigue ("zapped" following treatments). Negative for diaphoresis, fever and weight loss.       "I am ok today. After treatment and that shot, I feel zapped".   HENT: Negative.   Eyes: Negative.   Respiratory: Negative for cough, hemoptysis, sputum production and shortness of breath.   Cardiovascular: Negative for chest pain, palpitations, orthopnea, leg swelling and PND.       MI s/p angioplasty with placement of 5-6 stents (last in 2015). Blood pressure managed on 3 antihypertensives.  Gastrointestinal: Positive for heartburn. Negative for abdominal pain, blood in stool, constipation (uses OTC interventions; effective), diarrhea, melena, nausea and vomiting.  Genitourinary: Negative for dysuria, frequency, hematuria and urgency.  Musculoskeletal: Positive for back pain (aching). Negative for falls, joint pain and myalgias.  Skin: Positive for itching  (chronic diffuse pruritis; prone to itching) and rash (recurrent intertriginous rash (sub-pannus) - using topical interventions PRN).  Neurological: Positive for tingling (diabetic neuropathy in feet) and weakness (generalized). Negative for dizziness, tremors and headaches.  Endo/Heme/Allergies: Positive for environmental allergies (PMH (+) for seasonal allergies). Does not bruise/bleed easily.       Type II diabetes  Psychiatric/Behavioral: Negative for depression, memory loss and suicidal ideas. The patient is nervous/anxious (chronic). The patient does not have insomnia.   All other systems reviewed and are negative.  Performance status (ECOG): 2 - Symptomatic, <50% confined to bed  Vital signs BP (!) 167/89 (BP Location: Left Arm, Patient Position: Sitting)   Pulse 65   Temp (!) 94.1 F (34.5 C) (Tympanic)   Resp 18   Wt 164 lb 9 oz (74.6 kg)   BMI 25.02 kg/m   Physical Exam  Constitutional: He is oriented to person, place, and time and well-developed, well-nourished, and in no distress.  HENT:  Head: Normocephalic and atraumatic.  Thin gray hair  Eyes: Pupils are equal, round, and reactive to light. EOM are normal. No scleral icterus.  Brown eyes  Neck: Normal range of motion. Neck supple. No tracheal deviation present. No thyromegaly present.  Cardiovascular: Normal rate, regular rhythm and normal heart sounds. Exam reveals no gallop and no friction rub.  No murmur heard. Pulmonary/Chest: Effort normal and breath sounds normal. No respiratory distress. He has no wheezes. He has no rales.  Abdominal: Soft. Bowel sounds are normal. He exhibits no distension. There is no tenderness.  Musculoskeletal: Normal range of motion. He exhibits no edema or tenderness.  Lymphadenopathy:    He has no cervical adenopathy.    He has no axillary adenopathy.       Right: No inguinal and no supraclavicular adenopathy present.       Left: No inguinal and no supraclavicular adenopathy present.   Neurological: He is alert and oriented to person, place, and time.  Skin: Skin is warm and dry. No rash noted. No erythema.  Pale  Psychiatric: Mood, affect and judgment normal.  Nursing note and vitals reviewed.   Infusion on 02/04/2018  Component Date Value Ref Range Status  . Magnesium 02/04/2018 2.3  1.7 - 2.4 mg/dL Final   Performed at [facility], [address] [place], [place] [address]  . Sodium 02/04/2018 136  135 - 145 mmol/L Final  . Potassium 02/04/2018 4.2  3.5 - 5.1 mmol/L Final  . Chloride 02/04/2018 104  98 - 111 mmol/L Final  . CO2 02/04/2018 25  22 - 32 mmol/L Final  . Glucose, Bld 02/04/2018 138* 70 - 99 mg/dL Final  . BUN 02/04/2018 12  8 - 23 mg/dL Final  . Creatinine, Ser 02/04/2018 0.64  0.61 - 1.24 mg/dL Final  . Calcium 02/04/2018 9.2  8.9 - 10.3 mg/dL Final  . Total Protein 02/04/2018 6.7  6.5 - 8.1 g/dL Final  . Albumin 02/04/2018 3.5  3.5 - 5.0 g/dL Final  . AST 02/04/2018 17  15 - 41 U/L Final  . ALT 02/04/2018 12  0 - 44 U/L Final  . Alkaline Phosphatase 02/04/2018 83  38 - 126 U/L Final  . Total Bilirubin 02/04/2018 0.9  0.3 - 1.2 mg/dL Final  . GFR calc non Af Amer 02/04/2018 >60  >60 mL/min Final  . GFR calc Af Amer 02/04/2018 >60  >60 mL/min Final   Comment: (NOTE) The eGFR has been calculated using the CKD EPI equation. This calculation has not been validated in all clinical situations. eGFR's persistently <60 mL/min signify possible Chronic Kidney Disease.   Georgiann Hahn gap 02/04/2018 7  5 - 15 Final   Performed at [facility], [address] [place], [place] [address]  . WBC 02/04/2018 4.0  3.8 - 10.6 K/uL Final  . RBC 02/04/2018 3.12* 4.40 -  5.90 MIL/uL Final  . Hemoglobin 02/04/2018 9.8* 13.0 - 18.0 g/dL Final  . HCT 02/04/2018 28.8* 40.0 - 52.0 % Final  . MCV 02/04/2018 92.5  80.0 - 100.0 fL Final  . MCH 02/04/2018 31.5  26.0 - 34.0 pg Final  . MCHC 02/04/2018 34.1  32.0 - 36.0 g/dL Final  . RDW 02/04/2018 18.3* 11.5 -  14.5 % Final  . Platelets 02/04/2018 206  150 - 440 K/uL Final  . Neutrophils Relative % 02/04/2018 54  % Final  . Neutro Abs 02/04/2018 2.1  1.4 - 6.5 K/uL Final  . Lymphocytes Relative 02/04/2018 24  % Final  . Lymphs Abs 02/04/2018 1.0  1.0 - 3.6 K/uL Final  . Monocytes Relative 02/04/2018 18  % Final  . Monocytes Absolute 02/04/2018 0.7  0.2 - 1.0 K/uL Final  . Eosinophils Relative 02/04/2018 3  % Final  . Eosinophils Absolute 02/04/2018 0.1  0 - 0.7 K/uL Final  . Basophils Relative 02/04/2018 1  % Final  . Basophils Absolute 02/04/2018 0.0  0 - 0.1 K/uL Final   Performed at [facility], [address] [place], [place] [address]    Assessment:  Patrick Mcgee is a 72 y.o. 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  He presented with abdominal pain and weight loss.  Abdomen and pelvic CT on 12/01/2017 revealed a suspicious enhancing, filling defect involving the lower pole collecting system of the left kidney with asymmetric hypoenhancement of the inferior pole cortex of left kidney noted. Findings were worrisome for urothelial neoplasm.  There was abdominal and pelvic adenopathy compatible with nodal metastasis.  There was a 2.6 cm right para celiac node, 1,6 cm left retroperitoneal node, 1.4 cm right retroperitoneal node, 2.1 cm right common iliac node and 1 cm left external iliac node.  There was aortic atherosclerosis, kidney cysts, and prostate gland enlargement.  Chest CT on 12/07/2017 revealed no evidence of metastatic disease.    He underwent left ureteroscopy with renal pelvis biopsy and left ureteral stent placement on 12/10/2017.  Findings included a large nodular, vascular tumor within the left lower pole collecting system completely obstructing the entire left lower pole moiety.  Ureter, right kidney, and bladder was normal.  Pathology revealed small fragments of high-grade urothelial carcinoma with a small focus of  invasion.  He is s/p cycle #2 carboplatin + gemcitabine (12/21/2017 - 01/21/2018). He is tolerating treatment well.   He has B12 deficiency.  B12 was 247 on 12/31/2017.  He began oral B12 on 01/07/2018.  He has diabetes and coronary artery disease s/p MI in 1995.  He is s/p stent placement x 5-6.  Colonoscopy in 2018 was negative.  Symptomatically, patient is doing well overall. He denies acute concerns. He has constipation that is controlled with OTC interventions. Pain is well controlled with APAP only.  Exam reveals a pale complexion, but is otherwise unremarkable.  WBC is 4000 ([place] 2100). Hemoglobin is 9.8.  Platelet count is 206,000. Renal and hepatic function normal.   Plan: 1. Labs today:  CBC with diff, CMP, Mg 2. Labs reviewed. Blood counts stable and adequate enough for treatment. Will proceed with day 1 of cycle #3 carboplatin + gemcitabine. 3. Review plans for advancing chemotherapy dosing.  Initial 2 cycles of chemotherapy were tolerated well, however patient only received an 80% gemcitabine dose.    Review pre-treatment counts as compared to those following a treatment cycle with 80% dosing.   Discussed  the potential need for GCSF to support declining post-treatment counts.   Will maintain dose with this cycle and consider advancing dose with cycle #4 if tolerated.  4. Discussed chemotherapy-induced neutropenia.  Anticipate further decline in the patient's counts following his treatment today.  Reviewed neutropenic precautions, including the need to avoid sick contacts and maintain strict handwashing. Discuss symptom management.  Patient has antiemetics and pain medications at home to use on a PRN basis. Patient  advising that the  prescribed interventions are adequate at this point. Continue all medications as previously prescribed.  5. Discuss protein calorie malnutrition. Weight today is 164 lb 9 oz (74.6 kg).  Patient describes an adequate appetite, and he reportedly is  taking an calorie and protein dense food choices. BMI of 25.02 kg/m places patient in a normal weight category.  Encouraged him to continue to increase his negative calorie and protein gets food choices.  He is scheduled to meet with the cancer center RD today to discuss strategies to prevent further weight decline. 6. Discuss HYPOmagnesemia in the past. Magnesium is stable at 2.3 today. Prescribed daily Mag-ox 400 mg dose effective. Change to every other day dosing schedule. 7. Discussed palliative care.  Patient being followed by outpatient palliative medicine team.  Pain well controlled.  No immediate needs identified at this time.  We will continue to coordinate care with palliative medicine team. 8. Schedule follow-up CT imaging on 02/22/2018 9. RTC on 02/11/2018 for MD assessment, labs (CBC with differential, CMP, Mg, B12), and day 8 of cycle #3 gemcitabine.   Honor Loh, NP  02/04/18, 10:36 AM   I saw and evaluated the patient, participating in the key portions of the service and reviewing pertinent diagnostic studies and records.  I reviewed the nurse practitioner's note and agree with the findings and the plan.  The assessment and plan were discussed with the patient.  Multiple questions were asked by the patient and answered.   Nolon Stalls, MD 02/04/2018,10:36 AM

## 2018-02-04 ENCOUNTER — Encounter: Payer: Self-pay | Admitting: Hematology and Oncology

## 2018-02-04 ENCOUNTER — Inpatient Hospital Stay (HOSPITAL_BASED_OUTPATIENT_CLINIC_OR_DEPARTMENT_OTHER): Payer: Medicare Other | Admitting: Hematology and Oncology

## 2018-02-04 ENCOUNTER — Inpatient Hospital Stay: Payer: Medicare Other

## 2018-02-04 VITALS — BP 167/89 | HR 65 | Temp 94.1°F | Resp 18 | Wt 164.6 lb

## 2018-02-04 DIAGNOSIS — Z5111 Encounter for antineoplastic chemotherapy: Secondary | ICD-10-CM | POA: Diagnosis not present

## 2018-02-04 DIAGNOSIS — C642 Malignant neoplasm of left kidney, except renal pelvis: Secondary | ICD-10-CM

## 2018-02-04 DIAGNOSIS — E43 Unspecified severe protein-calorie malnutrition: Secondary | ICD-10-CM

## 2018-02-04 DIAGNOSIS — C689 Malignant neoplasm of urinary organ, unspecified: Secondary | ICD-10-CM

## 2018-02-04 DIAGNOSIS — E538 Deficiency of other specified B group vitamins: Secondary | ICD-10-CM

## 2018-02-04 DIAGNOSIS — E1140 Type 2 diabetes mellitus with diabetic neuropathy, unspecified: Secondary | ICD-10-CM | POA: Diagnosis not present

## 2018-02-04 DIAGNOSIS — C778 Secondary and unspecified malignant neoplasm of lymph nodes of multiple regions: Secondary | ICD-10-CM

## 2018-02-04 DIAGNOSIS — D701 Agranulocytosis secondary to cancer chemotherapy: Secondary | ICD-10-CM

## 2018-02-04 DIAGNOSIS — G893 Neoplasm related pain (acute) (chronic): Secondary | ICD-10-CM

## 2018-02-04 DIAGNOSIS — Z5189 Encounter for other specified aftercare: Secondary | ICD-10-CM | POA: Diagnosis not present

## 2018-02-04 DIAGNOSIS — E440 Moderate protein-calorie malnutrition: Secondary | ICD-10-CM

## 2018-02-04 DIAGNOSIS — I2510 Atherosclerotic heart disease of native coronary artery without angina pectoris: Secondary | ICD-10-CM

## 2018-02-04 DIAGNOSIS — T451X5A Adverse effect of antineoplastic and immunosuppressive drugs, initial encounter: Secondary | ICD-10-CM

## 2018-02-04 LAB — COMPREHENSIVE METABOLIC PANEL
ALT: 12 U/L (ref 0–44)
ANION GAP: 7 (ref 5–15)
AST: 17 U/L (ref 15–41)
Albumin: 3.5 g/dL (ref 3.5–5.0)
Alkaline Phosphatase: 83 U/L (ref 38–126)
BILIRUBIN TOTAL: 0.9 mg/dL (ref 0.3–1.2)
BUN: 12 mg/dL (ref 8–23)
CHLORIDE: 104 mmol/L (ref 98–111)
CO2: 25 mmol/L (ref 22–32)
Calcium: 9.2 mg/dL (ref 8.9–10.3)
Creatinine, Ser: 0.64 mg/dL (ref 0.61–1.24)
GFR calc Af Amer: >60 mL/min (ref >60)
Glucose, Bld: 138 mg/dL — ABNORMAL HIGH (ref 70–99)
POTASSIUM: 4.2 mmol/L (ref 3.5–5.1)
Sodium: 136 mmol/L (ref 135–145)
TOTAL PROTEIN: 6.7 g/dL (ref 6.5–8.1)

## 2018-02-04 LAB — CBC WITH DIFFERENTIAL/PLATELET
BASOS ABS: 0 10*3/uL (ref 0–0.1)
BASOS PCT: 1 %
Eosinophils Absolute: 0.1 10*3/uL (ref 0–0.7)
Eosinophils Relative: 3 %
HCT: 28.8 % — ABNORMAL LOW (ref 40.0–52.0)
HEMOGLOBIN: 9.8 g/dL — AB (ref 13.0–18.0)
LYMPHS PCT: 24 %
Lymphs Abs: 1 10*3/uL (ref 1.0–3.6)
MCH: 31.5 pg (ref 26.0–34.0)
MCHC: 34.1 g/dL (ref 32.0–36.0)
MCV: 92.5 fL (ref 80.0–100.0)
Monocytes Absolute: 0.7 10*3/uL (ref 0.2–1.0)
Monocytes Relative: 18 %
NEUTROS ABS: 2.1 10*3/uL (ref 1.4–6.5)
NEUTROS PCT: 54 %
Platelets: 206 10*3/uL (ref 150–440)
RBC: 3.12 MIL/uL — ABNORMAL LOW (ref 4.40–5.90)
RDW: 18.3 % — AB (ref 11.5–14.5)
WBC: 4 10*3/uL (ref 3.8–10.6)

## 2018-02-04 LAB — MAGNESIUM: MAGNESIUM: 2.3 mg/dL (ref 1.7–2.4)

## 2018-02-04 LAB — VITAMIN B12: Vitamin B-12: 537 pg/mL (ref 180–914)

## 2018-02-04 MED ORDER — PALONOSETRON HCL INJECTION 0.25 MG/5ML
0.2500 mg | Freq: Once | INTRAVENOUS | Status: AC
  Administered 2018-02-04: 0.25 mg via INTRAVENOUS
  Filled 2018-02-04: qty 5

## 2018-02-04 MED ORDER — SODIUM CHLORIDE 0.9 % IV SOLN
Freq: Once | INTRAVENOUS | Status: AC
  Administered 2018-02-04: 11:00:00 via INTRAVENOUS
  Filled 2018-02-04: qty 1000

## 2018-02-04 MED ORDER — SODIUM CHLORIDE 0.9 % IV SOLN
1600.0000 mg | Freq: Once | INTRAVENOUS | Status: AC
  Administered 2018-02-04: 1600 mg via INTRAVENOUS
  Filled 2018-02-04: qty 26.3

## 2018-02-04 MED ORDER — SODIUM CHLORIDE 0.9 % IV SOLN
370.4000 mg | Freq: Once | INTRAVENOUS | Status: AC
  Administered 2018-02-04: 370 mg via INTRAVENOUS
  Filled 2018-02-04: qty 37

## 2018-02-04 MED ORDER — SODIUM CHLORIDE 0.9 % IV SOLN: 10.0000 mg | Freq: Once | INTRAVENOUS | Status: DC

## 2018-02-04 MED ORDER — DEXAMETHASONE SODIUM PHOSPHATE 10 MG/ML IJ SOLN
10.0000 mg | Freq: Once | INTRAMUSCULAR | Status: AC
  Administered 2018-02-04: 10 mg via INTRAVENOUS
  Filled 2018-02-04: qty 1

## 2018-02-04 MED ORDER — HEPARIN SOD (PORK) LOCK FLUSH 100 UNIT/ML IV SOLN
500.0000 [IU] | Freq: Once | INTRAVENOUS | Status: AC | PRN
  Administered 2018-02-04: 500 [IU]

## 2018-02-04 NOTE — Progress Notes (Signed)
Patient offers no complaints today. 

## 2018-02-05 ENCOUNTER — Telehealth: Payer: Self-pay | Admitting: Family Medicine

## 2018-02-05 NOTE — Telephone Encounter (Signed)
Please review

## 2018-02-05 NOTE — Telephone Encounter (Signed)
Advised  ED 

## 2018-02-05 NOTE — Telephone Encounter (Signed)
Pt called saying yesterday when he went to chemo his blood pressure was running a little high like the 160's over 100. It was also high last night.   His blood sugar was 355 after the steroid treatment.  He wants to know if he should do anything different with his medications  Please advise  CB# [phone]  Thanks teri

## 2018-02-05 NOTE — Telephone Encounter (Signed)
Monitor both BP and BS--not surprised a little higher on steroids.

## 2018-02-10 NOTE — Progress Notes (Signed)
[facility] day:  02/11/2018   Chief Complaint: Patrick Mcgee is a 72 y.o. male with metastatic urothelial carcinoma who is seen for assessment prior to day 8 of cycle #3 gemcitabine.  HPI:  [name] patient was last seen in the medical oncology clinic on 02/04/2018.  At that time, patient was fatigued. He was otherwise tolerating treatments well, with no nausea, vomiting, or bowel changes. He denied pain. Appetite stable; weight up 2 pounds. Exam was unremarkable. WBC 4000 ([place] 2100). Platelets 206,000. He received day 1 of cycle #3 carboplatin + gemcitabine.   In the interim, patient has been doing well. He notes that he has started having problems with his urination. Patient states, "When I get up to go to the bathroom, by the time that I get there I have this intense fullness. The fullness will continue almost until I finish urinating". Patient denies any other acute symptoms. Patient denies that he has experienced any B symptoms. He denies any interval infections. Patient having continued constipation. He is taking milk of magnesia and stool softeners on a day basis. Patient notes that he is trying to be more active. Patient states, "I love those steroid shots. I feel good after those".   Patient advises that he maintains an adequate appetite. He is eating well. Weight today is 167 lb 3 oz (75.8 kg), which compared to his last visit to the clinic, represents a 3 pound increase.     Patient denies pain in the clinic today.  Patient has a scheduled family trip coming up between 03/30/2018 - 04/06/2018. Wife is asking that his treatment schedule be amended as needed to accommodate their travel plans to [facility].    Past Medical History:  Diagnosis Date  . Acid reflux   . Anxiety   . Cancer ([place])    renal mass  . Depression   . Diabetes mellitus without complication ([place])   . Hyperlipidemia   . Hypertension   . Myocardial infarction ([place]) 1995   . Sleep apnea     Past Surgical History:  Procedure Laterality Date  . CATARACT EXTRACTION Left   . COLONOSCOPY  2010   [facility]  . COLONOSCOPY WITH PROPOFOL N/A 10/04/2016   Procedure: COLONOSCOPY WITH PROPOFOL;  Surgeon: [name], MD;  Location: [facility] ENDOSCOPY;  Service: Endoscopy;  Laterality: N/A;  . [place], 2000, 2001, 2014  . CYSTOSCOPY W/ RETROGRADES Bilateral 12/10/2017   Procedure: CYSTOSCOPY WITH RETROGRADE PYELOGRAM;  Surgeon: [name], MD;  Location: [facility] ORS;  Service: Urology;  Laterality: Bilateral;  . CYSTOSCOPY WITH STENT PLACEMENT Left 12/10/2017   Procedure: CYSTOSCOPY WITH STENT PLACEMENT;  Surgeon: [name], MD;  Location: [facility] ORS;  Service: Urology;  Laterality: Left;  . INGUINAL HERNIA REPAIR Right 08/09/2015   Procedure: HERNIA REPAIR INGUINAL ADULT;  Surgeon: [name], MD;  Location: [facility] ORS;  Service: General;  Laterality: Right;  . NASAL SINUS SURGERY    . nuclear stress test    . PORTA CATH INSERTION N/A 12/19/2017   Procedure: PORTA CATH INSERTION;  Surgeon: [name], MD;  Location: [facility] CV LAB;  Service: Cardiovascular;  Laterality: N/A;  . URETERAL BIOPSY Left 12/10/2017   Procedure: URETERAL & renal PELVIS BIOPSY;  Surgeon: [name], MD;  Location: [facility] ORS;  Service: Urology;  Laterality: Left;  . URETEROSCOPY Left 12/10/2017   Procedure: URETEROSCOPY;  Surgeon: [name], MD;  Location:  [facility] ORS;  Service: Urology;  Laterality: Left;    Family History  Problem Relation Age of Onset  . Heart disease Mother   . Cancer Father        Lung and colon cancer  . Heart disease Father   . Emphysema Maternal Grandfather   . Tuberculosis Maternal Grandmother     Social History:  reports that he has never smoked. He has quit using smokeless tobacco. His smokeless tobacco use included chew. He reports that he drinks about 0.6 - 3.0 oz of alcohol per week. He reports  that he does not use drugs.  Patient is retired from the [employer]. Patient denies known exposures to radiation on toxins. The patient has a family trip planned to [facility] between 03/30/2018 - 04/06/2018. The patient is accompanied by his wife, [name], today.  Allergies:  Allergies  Allergen Reactions  . Sulfa Antibiotics Other (See Comments)    Joint pain  . Ace Inhibitors Cough  . Invokana [Canagliflozin] Other (See Comments)    Leg pain  . Penicillins Rash    Has patient had a PCN reaction causing immediate rash, facial/tongue/throat swelling, SOB or lightheadedness with hypotension: Yes Has patient had a PCN reaction causing severe rash involving mucus membranes or skin necrosis: No Has patient had a PCN reaction that required hospitalization: No Has patient had a PCN reaction occurring within the last 10 years: No If all of the above answers are "NO", then may proceed with Cephalosporin use.    Current Medications: Current Outpatient Medications  Medication Sig Dispense Refill  . acetaminophen (TYLENOL) 500 MG tablet Take 500 mg by mouth every 6 (six) hours as needed for moderate pain. When taking oxycodone 5 mg tablets    . ALPRAZolam (XANAX) 0.25 MG tablet Take 1 tablet (0.25 mg total) by mouth 3 (three) times daily as needed for anxiety. 20 tablet 0  . atorvastatin (LIPITOR) 40 MG tablet TAKE ONE TABLET BY MOUTH ONCE DAILY (Patient taking differently: TAKE ONE TABLET BY MOUTH DAILY AT BEDTIME) 90 tablet 3  . carvedilol (COREG) 12.5 MG tablet TAKE ONE TABLET BY MOUTH TWICE DAILY 180 tablet 3  . clobetasol cream (TEMOVATE) 1.61 % Apply 1 application topically as needed (for skin rash). 30 g 1  . doxepin (SINEQUAN) 10 MG capsule Take 1 capsule (10 mg total) by mouth 4 (four) times daily as needed. 120 capsule 5  . feeding supplement, ENSURE ENLIVE, (ENSURE ENLIVE) LIQD Take 237 mLs by mouth 3 (three) times daily between meals. 90 Bottle 0  . fentaNYL (DURAGESIC - DOSED MCG/HR)  25 MCG/HR patch Place 1 patch (25 mcg total) onto the skin every 3 (three) days. 10 patch 0  . fluticasone (FLONASE) 50 MCG/ACT nasal spray Place 2 sprays into both nostrils daily. (Patient taking differently: Place 2 sprays into both nostrils daily as needed for allergies or rhinitis. ) 16 g 12  . glucose blood (CONTOUR NEXT TEST) test strip Checks sugar 4 times daily. DX E11.9-strips for Contour next EZ meter 360 each 3  . HYDROmorphone (DILAUDID) 2 MG tablet Take 1 tablet (2 mg total) by mouth every 4 (four) hours as needed for severe pain (breakthrough pain). 40 tablet 0  . Ivermectin (SOOLANTRA) 1 % CREA Apply topically at bedtime. To face    . lactulose (CEPHULAC) 20 g packet Take 1 packet (20 g total) by mouth 2 (two) times daily as needed (constipation). 60 each 0  . lidocaine-prilocaine (EMLA) cream Apply 1 application topically  as needed. 30 g 3  . linaclotide (LINZESS) 290 MCG CAPS capsule Take 1 capsule (290 mcg total) by mouth daily before breakfast. 30 capsule 0  . losartan (COZAAR) 100 MG tablet TAKE ONE TABLET BY MOUTH ONCE DAILY (Patient taking differently: TAKE ONE TABLET BY MOUTH DAILY AT BEDTIME) 90 tablet 3  . meclizine (ANTIVERT) 25 MG tablet Take 25 mg by mouth 3 (three) times daily as needed for dizziness.    . metFORMIN (GLUCOPHAGE) 1000 MG tablet Take 1 tablet (1,000 mg total) by mouth 2 (two) times daily with a meal. 180 tablet 3  . nystatin cream (MYCOSTATIN) APPLY  CREAM TOPICALLY TWICE DAILY AROUND  THE  GROIN  AREA (Patient taking differently: APPLY  CREAM TOPICALLY TWICE DAILY AROUND  THE  GROIN  AREA AS NEEDED FOR SKIN RASH) 15 g 1  . ondansetron (ZOFRAN) 8 MG tablet Take 1 tablet (8 mg total) by mouth every 8 (eight) hours as needed for nausea or vomiting (if needed beginning 3 days after chemotherapy). 20 tablet 0  . oxyCODONE (OXYCONTIN) 15 mg 12 hr tablet Take 1 tablet (15 mg total) by mouth every 12 (twelve) hours. 20 tablet 0  . polyethylene glycol (MIRALAX)  packet Take 17 g by mouth 2 (two) times daily. (Patient taking differently: Take 17 g by mouth 2 (two) times daily as needed for moderate constipation. ) 14 each 0  . sodium chloride 1 g tablet Take 1 tablet (1 g total) by mouth 3 (three) times daily with meals. 15 tablet 0  . tamsulosin (FLOMAX) 0.4 MG CAPS capsule Take 1 capsule (0.4 mg total) by mouth daily. 30 capsule 0   No current facility-administered medications for this visit.    Facility-Administered Medications Ordered in Other Visits  Medication Dose Route Frequency Provider Last Rate Last Dose  . heparin lock flush 100 unit/mL  500 Units Intravenous Once Corcoran, Melissa C, MD      . sodium chloride flush (NS) 0.9 % injection 10 mL  10 mL Intravenous PRN [name], MD   10 mL at 02/11/18 0093    Review of Systems  Constitutional: Positive for malaise/fatigue (following treatment). Negative for diaphoresis, fever and weight loss (weight up 3 pounds).       "I am doing alright". Trying to be more active.  HENT: Negative.   Eyes: Negative.   Respiratory: Negative for cough, hemoptysis, sputum production and shortness of breath.   Cardiovascular: Negative for chest pain, palpitations, orthopnea, leg swelling and PND.       MI s/p angioplasty with placement of 5-6 stents (last in 2015). On 3 antihypertensives.   Gastrointestinal: Positive for heartburn (controlled on PPI). Negative for abdominal pain, blood in stool, constipation (daily MOM and ctool softeners), diarrhea, melena, nausea and vomiting.  Genitourinary: Negative for dysuria, frequency, hematuria and urgency.       Urinary fullness  Musculoskeletal: Positive for back pain (aching - improved). Negative for falls, joint pain and myalgias.  Skin: Positive for itching (chronic diffuse pruritis - prone to itching) and rash (recurrent issue with sub-pannicular intertriginous rash - using Nystatin topical PRN).  Neurological: Positive for sensory change (DIABETIC  neuropathy in feet). Negative for dizziness, tremors, weakness and headaches.  Endo/Heme/Allergies: Positive for environmental allergies (PMH (+) for seasonal allergies). Does not bruise/bleed easily.       Diabetes  Psychiatric/Behavioral: Negative for depression, memory loss and suicidal ideas. The patient is nervous/anxious (chronic). The patient does not have insomnia.   All  other systems reviewed and are negative.  Performance status (ECOG): 2 - Symptomatic, <50% confined to bed  Vital signs BP (!) 164/92 (BP Location: Left Arm, Patient Position: Sitting)   Pulse 61   Temp (!) 96.4 F (35.8 C) (Tympanic)   Wt 167 lb 3 oz (75.8 kg)   BMI 25.42 kg/m   Physical Exam  Constitutional: He is oriented to person, place, and time and well-developed, well-nourished, and in no distress.  HENT:  Head: Normocephalic and atraumatic.  Thin gray hair  Eyes: Pupils are equal, round, and reactive to light. EOM are normal. No scleral icterus.  Brown eyes  Neck: Normal range of motion. Neck supple. No tracheal deviation present. No thyromegaly present.  Cardiovascular: Normal rate, regular rhythm and normal heart sounds. Exam reveals no gallop and no friction rub.  No murmur heard. Pulmonary/Chest: Effort normal and breath sounds normal. No respiratory distress. He has no wheezes. He has no rales.  Abdominal: Soft. Bowel sounds are normal. He exhibits no distension. There is no tenderness.  Musculoskeletal: Normal range of motion. He exhibits no edema or tenderness.  Lymphadenopathy:    He has no cervical adenopathy.    He has no axillary adenopathy.       Right: No inguinal and no supraclavicular adenopathy present.       Left: No inguinal and no supraclavicular adenopathy present.  Neurological: He is alert and oriented to person, place, and time. Gait normal.  Skin: Skin is warm and dry. No rash noted. No erythema.  Psychiatric: Mood, affect and judgment normal.  Nursing note and vitals  reviewed.   Infusion on 02/11/2018  Component Date Value Ref Range Status  . Magnesium 02/11/2018 2.0  1.7 - 2.4 mg/dL Final   Performed at [facility] [address], [place], [place] [address]  . Sodium 02/11/2018 138  135 - 145 mmol/L Final  . Potassium 02/11/2018 4.2  3.5 - 5.1 mmol/L Final  . Chloride 02/11/2018 106  98 - 111 mmol/L Final  . CO2 02/11/2018 23  22 - 32 mmol/L Final  . Glucose, Bld 02/11/2018 152* 70 - 99 mg/dL Final  . BUN 02/11/2018 15  8 - 23 mg/dL Final  . Creatinine, Ser 02/11/2018 0.47* 0.61 - 1.24 mg/dL Final  . Calcium 02/11/2018 9.2  8.9 - 10.3 mg/dL Final  . Total Protein 02/11/2018 6.7  6.5 - 8.1 g/dL Final  . Albumin 02/11/2018 3.5  3.5 - 5.0 g/dL Final  . AST 02/11/2018 21  15 - 41 U/L Final  . ALT 02/11/2018 19  0 - 44 U/L Final  . Alkaline Phosphatase 02/11/2018 74  38 - 126 U/L Final  . Total Bilirubin 02/11/2018 0.8  0.3 - 1.2 mg/dL Final  . GFR calc non Af Amer 02/11/2018 >60  >60 mL/min Final  . GFR calc Af Amer 02/11/2018 >60  >60 mL/min Final   Comment: (NOTE) The eGFR has been calculated using the CKD EPI equation. This calculation has not been validated in all clinical situations. eGFR's persistently <60 mL/min signify possible Chronic Kidney Disease.   Georgiann Hahn gap 02/11/2018 9  5 - 15 Final   Performed at [facility], [address] [place], [place] [address]  . WBC 02/11/2018 3.2* 3.8 - 10.6 K/uL Final  . RBC 02/11/2018 2.97* 4.40 - 5.90 MIL/uL Final  . Hemoglobin 02/11/2018 9.4* 13.0 - 18.0 g/dL Final  . HCT 02/11/2018 27.7* 40.0 - 52.0 % Final  . MCV 02/11/2018 93.1  80.0 - 100.0 fL Final  . MCH 02/11/2018 31.5  26.0 - 34.0 pg Final  . MCHC 02/11/2018 33.9  32.0 - 36.0 g/dL Final  . RDW 02/11/2018 18.1* 11.5 - 14.5 % Final  . Platelets 02/11/2018 167  150 - 440 K/uL Final  . Neutrophils Relative % 02/11/2018 60  % Final  . Neutro Abs 02/11/2018 2.0  1.4 - 6.5 K/uL Final  . Lymphocytes Relative 02/11/2018 27  %  Final  . Lymphs Abs 02/11/2018 0.9* 1.0 - 3.6 K/uL Final  . Monocytes Relative 02/11/2018 9  % Final  . Monocytes Absolute 02/11/2018 0.3  0.2 - 1.0 K/uL Final  . Eosinophils Relative 02/11/2018 3  % Final  . Eosinophils Absolute 02/11/2018 0.1  0 - 0.7 K/uL Final  . Basophils Relative 02/11/2018 1  % Final  . Basophils Absolute 02/11/2018 0.0  0 - 0.1 K/uL Final   Performed at [facility], [address] [place], [place] [address]    Assessment:  QUARAN KEDZIERSKI is a 72 y.o. 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  He presented with abdominal pain and weight loss.  Abdomen and pelvic CT on 12/01/2017 revealed a suspicious enhancing, filling defect involving the lower pole collecting system of the left kidney with asymmetric hypoenhancement of the inferior pole cortex of left kidney noted. Findings were worrisome for urothelial neoplasm.  There was abdominal and pelvic adenopathy compatible with nodal metastasis.  There was a 2.6 cm right para celiac node, 1,6 cm left retroperitoneal node, 1.4 cm right retroperitoneal node, 2.1 cm right common iliac node and 1 cm left external iliac node.  There was aortic atherosclerosis, kidney cysts, and prostate gland enlargement.  Chest CT on 12/07/2017 revealed no evidence of metastatic disease.    He underwent left ureteroscopy with renal pelvis biopsy and left ureteral stent placement on 12/10/2017.  Findings included a large nodular, vascular tumor within the left lower pole collecting system completely obstructing the entire left lower pole moiety.  Ureter, right kidney, and bladder was normal.  Pathology revealed small fragments of high-grade urothelial carcinoma with a small focus of invasion.  He is s/p cycle day 1 of #3 carboplatin + gemcitabine (12/21/2017 - 02/04/2018). He is tolerating treatment well.   He has B12 deficiency.  B12 was 247 on 12/31/2017.  He began oral B12 on  01/07/2018.  He has diabetes and coronary artery disease s/p MI in 1995.  He is s/p stent placement x 5-6.  Colonoscopy in 2018 was negative.  Symptomatically, he is doing well overall. He notes some urinary fullness as of late. . Patient denies B symptoms or interval symptoms.  He has constipation that is controlled with OTC interventions. Pain is well controlled with APAP only. Exam is stable. WBC 3200 ([place] 2000). Hemoglobin is 9.4.  Platelet count is 167,000. Renal and hepatic function normal.   Plan: 1. Labs today:  CBC with diff, CMP, Mg, B12, UA 2. Metastatic urothelial carcinoma - treatment ongoing  Patient continues to tolerate treatments well.   Labs reviewed. Blood counts stable and adequate enough for treatment. Will proceed with day 8 of cycle #3 gemcitabine.  Interval CT imaging of the chest, abdomen, and pelvis scheduled for 02/22/2018.  Discuss symptom management.  Patient has antiemetics and pain medications at home to use on a PRN basis. Patient  advising that the  prescribed interventions are adequate at this point. Continue all medications as previously prescribed.  3. Chemotherapy induced neutropenia - stable  WBC 3200 ([place] 2000) today.   Review plans for potential use of GCSF to support declining post-treatment blood counts.   Review need to maintain neutropenic precautions, including the need to avoid sick contacts and practice strict handwashing. 4. Urinary fullness - acute  Acute sensation of urinary fullness and discomfort. Will check UA today to further assess.  5. Protein calorie malnutrition - improving  His weight today is 167 lb 3 oz (75.8 kg). Patient describes an adequate appetite, with adequate intake of calorie and protein dense food choices.   Continue to follow up with the cancer center RD. 6. HYPOmagnesemia - stable  Magnesium level stable today at 2.0. Currently on Mag-ox 400 mg every other day. Continue as prescribed.   Patient's use of milk  of magnesia likely contributing to normal magnesium. 7. B12 deficiency - treatment ongoing  Patient has been on oral B12 1,000 mcg supplementation since 01/07/2018.   Will recheck level today to assess for improvement. Discuss potential need for parenteral supplementation if levels have not improved.  8. Care coordination with palliative medicine - ongoing  Continues to be followed by outpatient palliative medicine team. No immediate needs identified at this time. Will continue to coordinate care with palliative medicine team.  9. RTC on 02/25/2018 for MD assessment, labs (CBC with diff, CMP, Mg), and day 1 of cycle #4 carboplatin + gemcitabine. 10. RTC on 03/04/2018 for MD assessment, labs (CBC with diff, CMP, Mg), and day 8 of cycle #4 gemcitabine.    Honor Loh, NP  02/11/18, 10:22 AM   I saw and evaluated the patient, participating in the key portions of the service and reviewing pertinent diagnostic studies and records.  I reviewed the nurse practitioner's note and agree with the findings and the plan.  The assessment and plan were discussed with the patient.  Multiple questions were asked by the patient and answered.   Nolon Stalls, MD 02/11/2018,10:22 AM

## 2018-02-11 ENCOUNTER — Inpatient Hospital Stay: Payer: Medicare Other | Attending: Hematology and Oncology

## 2018-02-11 ENCOUNTER — Inpatient Hospital Stay (HOSPITAL_BASED_OUTPATIENT_CLINIC_OR_DEPARTMENT_OTHER): Payer: Medicare Other | Admitting: Hematology and Oncology

## 2018-02-11 ENCOUNTER — Inpatient Hospital Stay: Payer: Medicare Other

## 2018-02-11 ENCOUNTER — Encounter: Payer: Self-pay | Admitting: Hematology and Oncology

## 2018-02-11 VITALS — BP 164/92 | HR 61 | Temp 96.4°F | Wt 167.2 lb

## 2018-02-11 DIAGNOSIS — I2510 Atherosclerotic heart disease of native coronary artery without angina pectoris: Secondary | ICD-10-CM | POA: Insufficient documentation

## 2018-02-11 DIAGNOSIS — C778 Secondary and unspecified malignant neoplasm of lymph nodes of multiple regions: Secondary | ICD-10-CM | POA: Diagnosis not present

## 2018-02-11 DIAGNOSIS — D701 Agranulocytosis secondary to cancer chemotherapy: Secondary | ICD-10-CM | POA: Diagnosis not present

## 2018-02-11 DIAGNOSIS — C642 Malignant neoplasm of left kidney, except renal pelvis: Secondary | ICD-10-CM | POA: Diagnosis not present

## 2018-02-11 DIAGNOSIS — E538 Deficiency of other specified B group vitamins: Secondary | ICD-10-CM | POA: Diagnosis not present

## 2018-02-11 DIAGNOSIS — E43 Unspecified severe protein-calorie malnutrition: Secondary | ICD-10-CM | POA: Diagnosis not present

## 2018-02-11 DIAGNOSIS — R3989 Other symptoms and signs involving the genitourinary system: Secondary | ICD-10-CM

## 2018-02-11 DIAGNOSIS — T451X5A Adverse effect of antineoplastic and immunosuppressive drugs, initial encounter: Secondary | ICD-10-CM

## 2018-02-11 DIAGNOSIS — R300 Dysuria: Secondary | ICD-10-CM

## 2018-02-11 DIAGNOSIS — C689 Malignant neoplasm of urinary organ, unspecified: Secondary | ICD-10-CM

## 2018-02-11 DIAGNOSIS — Z5111 Encounter for antineoplastic chemotherapy: Secondary | ICD-10-CM | POA: Diagnosis not present

## 2018-02-11 DIAGNOSIS — D6481 Anemia due to antineoplastic chemotherapy: Secondary | ICD-10-CM | POA: Diagnosis not present

## 2018-02-11 DIAGNOSIS — E440 Moderate protein-calorie malnutrition: Secondary | ICD-10-CM

## 2018-02-11 DIAGNOSIS — E1140 Type 2 diabetes mellitus with diabetic neuropathy, unspecified: Secondary | ICD-10-CM | POA: Diagnosis not present

## 2018-02-11 LAB — URINALYSIS, COMPLETE (UACMP) WITH MICROSCOPIC
BACTERIA UA: NONE SEEN
BILIRUBIN URINE: NEGATIVE
Glucose, UA: >=500 mg/dL — AB
HGB URINE DIPSTICK: NEGATIVE
Ketones, ur: NEGATIVE mg/dL
LEUKOCYTES UA: NEGATIVE
NITRITE: NEGATIVE
PH: 6 (ref 5.0–8.0)
Protein, ur: NEGATIVE mg/dL
SPECIFIC GRAVITY, URINE: 1.009 (ref 1.005–1.030)

## 2018-02-11 LAB — CBC WITH DIFFERENTIAL/PLATELET
Basophils Absolute: 0 10*3/uL (ref 0–0.1)
Basophils Relative: 1 %
Eosinophils Absolute: 0.1 10*3/uL (ref 0–0.7)
Eosinophils Relative: 3 %
HCT: 27.7 % — ABNORMAL LOW (ref 40.0–52.0)
Hemoglobin: 9.4 g/dL — ABNORMAL LOW (ref 13.0–18.0)
Lymphocytes Relative: 27 %
Lymphs Abs: 0.9 10*3/uL — ABNORMAL LOW (ref 1.0–3.6)
MCH: 31.5 pg (ref 26.0–34.0)
MCHC: 33.9 g/dL (ref 32.0–36.0)
MCV: 93.1 fL (ref 80.0–100.0)
Monocytes Absolute: 0.3 10*3/uL (ref 0.2–1.0)
Monocytes Relative: 9 %
Neutro Abs: 2 10*3/uL (ref 1.4–6.5)
Neutrophils Relative %: 60 %
Platelets: 167 10*3/uL (ref 150–440)
RBC: 2.97 MIL/uL — ABNORMAL LOW (ref 4.40–5.90)
RDW: 18.1 % — ABNORMAL HIGH (ref 11.5–14.5)
WBC: 3.2 10*3/uL — ABNORMAL LOW (ref 3.8–10.6)

## 2018-02-11 LAB — COMPREHENSIVE METABOLIC PANEL
ALT: 19 U/L (ref 0–44)
AST: 21 U/L (ref 15–41)
Albumin: 3.5 g/dL (ref 3.5–5.0)
Alkaline Phosphatase: 74 U/L (ref 38–126)
Anion gap: 9 (ref 5–15)
BUN: 15 mg/dL (ref 8–23)
CO2: 23 mmol/L (ref 22–32)
Calcium: 9.2 mg/dL (ref 8.9–10.3)
Chloride: 106 mmol/L (ref 98–111)
Creatinine, Ser: 0.47 mg/dL — ABNORMAL LOW (ref 0.61–1.24)
GFR calc Af Amer: >60 mL/min (ref >60)
GFR calc non Af Amer: >60 mL/min (ref >60)
Glucose, Bld: 152 mg/dL — ABNORMAL HIGH (ref 70–99)
Potassium: 4.2 mmol/L (ref 3.5–5.1)
Sodium: 138 mmol/L (ref 135–145)
Total Bilirubin: 0.8 mg/dL (ref 0.3–1.2)
Total Protein: 6.7 g/dL (ref 6.5–8.1)

## 2018-02-11 LAB — MAGNESIUM: Magnesium: 2 mg/dL (ref 1.7–2.4)

## 2018-02-11 LAB — VITAMIN B12: Vitamin B-12: 983 pg/mL — ABNORMAL HIGH (ref 180–914)

## 2018-02-11 MED ORDER — ONDANSETRON HCL 4 MG PO TABS
8.0000 mg | ORAL_TABLET | Freq: Once | ORAL | Status: AC
  Administered 2018-02-11: 8 mg via ORAL
  Filled 2018-02-11: qty 2

## 2018-02-11 MED ORDER — SODIUM CHLORIDE 0.9 % IV SOLN
1600.0000 mg | Freq: Once | INTRAVENOUS | Status: AC
  Administered 2018-02-11: 1600 mg via INTRAVENOUS
  Filled 2018-02-11: qty 26.3

## 2018-02-11 MED ORDER — HEPARIN SOD (PORK) LOCK FLUSH 100 UNIT/ML IV SOLN: 500.0000 [IU] | Freq: Once | INTRAVENOUS | Status: DC

## 2018-02-11 MED ORDER — DEXAMETHASONE SODIUM PHOSPHATE 10 MG/ML IJ SOLN
10.0000 mg | Freq: Once | INTRAMUSCULAR | Status: AC
  Administered 2018-02-11: 10 mg via INTRAVENOUS
  Filled 2018-02-11: qty 1

## 2018-02-11 MED ORDER — SODIUM CHLORIDE 0.9% FLUSH
10.0000 mL | INTRAVENOUS | Status: DC | PRN
  Administered 2018-02-11: 10 mL via INTRAVENOUS
  Filled 2018-02-11: qty 10

## 2018-02-11 MED ORDER — HEPARIN SOD (PORK) LOCK FLUSH 100 UNIT/ML IV SOLN
500.0000 [IU] | Freq: Once | INTRAVENOUS | Status: AC | PRN
  Administered 2018-02-11: 500 [IU]

## 2018-02-11 MED ORDER — SODIUM CHLORIDE 0.9 % IV SOLN
Freq: Once | INTRAVENOUS | Status: AC
  Administered 2018-02-11: 11:00:00 via INTRAVENOUS
  Filled 2018-02-11: qty 1000

## 2018-02-11 NOTE — Progress Notes (Signed)
Patient states he is having problems with urgency when he has to urination.  No burning.  Otherwise, offers no complaints.

## 2018-02-21 ENCOUNTER — Ambulatory Visit: Payer: Medicare Other | Admitting: Family Medicine

## 2018-02-22 ENCOUNTER — Ambulatory Visit
Admission: RE | Admit: 2018-02-22 | Discharge: 2018-02-22 | Disposition: A | Discharge status: Home / Self Care | Payer: Medicare Other | Source: Ambulatory Visit | Attending: Urgent Care | Admitting: Urgent Care

## 2018-02-22 DIAGNOSIS — K209 Esophagitis, unspecified: Secondary | ICD-10-CM | POA: Insufficient documentation

## 2018-02-22 DIAGNOSIS — K8020 Calculus of gallbladder without cholecystitis without obstruction: Secondary | ICD-10-CM | POA: Insufficient documentation

## 2018-02-22 DIAGNOSIS — I2510 Atherosclerotic heart disease of native coronary artery without angina pectoris: Secondary | ICD-10-CM | POA: Insufficient documentation

## 2018-02-22 DIAGNOSIS — N400 Enlarged prostate without lower urinary tract symptoms: Secondary | ICD-10-CM | POA: Diagnosis not present

## 2018-02-22 DIAGNOSIS — I700 Atherosclerosis of aorta: Secondary | ICD-10-CM | POA: Diagnosis not present

## 2018-02-22 DIAGNOSIS — K5900 Constipation, unspecified: Secondary | ICD-10-CM | POA: Diagnosis not present

## 2018-02-22 DIAGNOSIS — C7989 Secondary malignant neoplasm of other specified sites: Secondary | ICD-10-CM | POA: Diagnosis not present

## 2018-02-22 DIAGNOSIS — R918 Other nonspecific abnormal finding of lung field: Secondary | ICD-10-CM | POA: Diagnosis not present

## 2018-02-22 DIAGNOSIS — C689 Malignant neoplasm of urinary organ, unspecified: Secondary | ICD-10-CM | POA: Diagnosis not present

## 2018-02-22 DIAGNOSIS — C801 Malignant (primary) neoplasm, unspecified: Secondary | ICD-10-CM | POA: Diagnosis not present

## 2018-02-22 DIAGNOSIS — G893 Neoplasm related pain (acute) (chronic): Secondary | ICD-10-CM

## 2018-02-22 DIAGNOSIS — C775 Secondary and unspecified malignant neoplasm of intrapelvic lymph nodes: Secondary | ICD-10-CM | POA: Diagnosis not present

## 2018-02-22 HISTORY — DX: Malignant neoplasm of urinary organ, unspecified: C68.9

## 2018-02-22 MED ORDER — IOPAMIDOL (ISOVUE-300) INJECTION 61%
100.0000 mL | Freq: Once | INTRAVENOUS | Status: AC | PRN
  Administered 2018-02-22: 100 mL via INTRAVENOUS

## 2018-02-25 ENCOUNTER — Encounter: Payer: Self-pay | Admitting: Hematology and Oncology

## 2018-02-25 ENCOUNTER — Inpatient Hospital Stay: Payer: Medicare Other

## 2018-02-25 ENCOUNTER — Inpatient Hospital Stay (HOSPITAL_BASED_OUTPATIENT_CLINIC_OR_DEPARTMENT_OTHER): Payer: Medicare Other | Admitting: Hematology and Oncology

## 2018-02-25 VITALS — BP 150/82 | HR 62 | Temp 97.2°F | Resp 18 | Ht 68.0 in | Wt 172.4 lb

## 2018-02-25 DIAGNOSIS — D6481 Anemia due to antineoplastic chemotherapy: Secondary | ICD-10-CM

## 2018-02-25 DIAGNOSIS — D701 Agranulocytosis secondary to cancer chemotherapy: Secondary | ICD-10-CM

## 2018-02-25 DIAGNOSIS — C778 Secondary and unspecified malignant neoplasm of lymph nodes of multiple regions: Secondary | ICD-10-CM

## 2018-02-25 DIAGNOSIS — T451X5A Adverse effect of antineoplastic and immunosuppressive drugs, initial encounter: Secondary | ICD-10-CM

## 2018-02-25 DIAGNOSIS — I2510 Atherosclerotic heart disease of native coronary artery without angina pectoris: Secondary | ICD-10-CM | POA: Diagnosis not present

## 2018-02-25 DIAGNOSIS — R05 Cough: Secondary | ICD-10-CM | POA: Diagnosis not present

## 2018-02-25 DIAGNOSIS — C642 Malignant neoplasm of left kidney, except renal pelvis: Secondary | ICD-10-CM | POA: Diagnosis not present

## 2018-02-25 DIAGNOSIS — E1140 Type 2 diabetes mellitus with diabetic neuropathy, unspecified: Secondary | ICD-10-CM | POA: Diagnosis not present

## 2018-02-25 DIAGNOSIS — Z5111 Encounter for antineoplastic chemotherapy: Secondary | ICD-10-CM

## 2018-02-25 DIAGNOSIS — E538 Deficiency of other specified B group vitamins: Secondary | ICD-10-CM

## 2018-02-25 DIAGNOSIS — R059 Cough, unspecified: Secondary | ICD-10-CM

## 2018-02-25 LAB — CBC WITH DIFFERENTIAL/PLATELET
Basophils Absolute: 0 10*3/uL (ref 0–0.1)
Basophils Relative: 1 %
Eosinophils Absolute: 0.1 10*3/uL (ref 0–0.7)
Eosinophils Relative: 3 %
HCT: 26.8 % — ABNORMAL LOW (ref 40.0–52.0)
Hemoglobin: 9.2 g/dL — ABNORMAL LOW (ref 13.0–18.0)
Lymphocytes Relative: 22 %
Lymphs Abs: 0.8 10*3/uL — ABNORMAL LOW (ref 1.0–3.6)
MCH: 32.2 pg (ref 26.0–34.0)
MCHC: 34.2 g/dL (ref 32.0–36.0)
MCV: 94 fL (ref 80.0–100.0)
Monocytes Absolute: 0.6 10*3/uL (ref 0.2–1.0)
Monocytes Relative: 17 %
Neutro Abs: 2 10*3/uL (ref 1.4–6.5)
Neutrophils Relative %: 57 %
Platelets: 166 10*3/uL (ref 150–440)
RBC: 2.85 MIL/uL — ABNORMAL LOW (ref 4.40–5.90)
RDW: 19.7 % — ABNORMAL HIGH (ref 11.5–14.5)
WBC: 3.6 10*3/uL — ABNORMAL LOW (ref 3.8–10.6)

## 2018-02-25 LAB — COMPREHENSIVE METABOLIC PANEL
ALT: 14 U/L (ref 0–44)
AST: 18 U/L (ref 15–41)
Albumin: 3.6 g/dL (ref 3.5–5.0)
Alkaline Phosphatase: 76 U/L (ref 38–126)
Anion gap: 7 (ref 5–15)
BUN: 12 mg/dL (ref 8–23)
CO2: 25 mmol/L (ref 22–32)
Calcium: 9.1 mg/dL (ref 8.9–10.3)
Chloride: 105 mmol/L (ref 98–111)
Creatinine, Ser: 0.63 mg/dL (ref 0.61–1.24)
GFR calc Af Amer: >60 mL/min (ref >60)
GFR calc non Af Amer: >60 mL/min (ref >60)
Glucose, Bld: 151 mg/dL — ABNORMAL HIGH (ref 70–99)
Potassium: 4.1 mmol/L (ref 3.5–5.1)
Sodium: 137 mmol/L (ref 135–145)
Total Bilirubin: 1.1 mg/dL (ref 0.3–1.2)
Total Protein: 6.5 g/dL (ref 6.5–8.1)

## 2018-02-25 LAB — MAGNESIUM: Magnesium: 2 mg/dL (ref 1.7–2.4)

## 2018-02-25 MED ORDER — SODIUM CHLORIDE 0.9 % IV SOLN
1600.0000 mg | Freq: Once | INTRAVENOUS | Status: AC
  Administered 2018-02-25: 1600 mg via INTRAVENOUS
  Filled 2018-02-25: qty 26.3

## 2018-02-25 MED ORDER — HEPARIN SOD (PORK) LOCK FLUSH 100 UNIT/ML IV SOLN
500.0000 [IU] | Freq: Once | INTRAVENOUS | Status: AC | PRN
  Administered 2018-02-25: 500 [IU]
  Filled 2018-02-25: qty 5

## 2018-02-25 MED ORDER — PALONOSETRON HCL INJECTION 0.25 MG/5ML
0.2500 mg | Freq: Once | INTRAVENOUS | Status: AC
  Administered 2018-02-25: 0.25 mg via INTRAVENOUS
  Filled 2018-02-25: qty 5

## 2018-02-25 MED ORDER — SODIUM CHLORIDE 0.9 % IV SOLN
Freq: Once | INTRAVENOUS | Status: AC
  Administered 2018-02-25: 11:00:00 via INTRAVENOUS
  Filled 2018-02-25: qty 1000

## 2018-02-25 MED ORDER — DEXAMETHASONE SODIUM PHOSPHATE 10 MG/ML IJ SOLN
10.0000 mg | Freq: Once | INTRAMUSCULAR | Status: AC
  Administered 2018-02-25: 10 mg via INTRAVENOUS
  Filled 2018-02-25: qty 1

## 2018-02-25 MED ORDER — EPOETIN ALFA 20000 UNIT/ML IJ SOLN
10000.0000 [IU] | Freq: Once | INTRAMUSCULAR | Status: AC
  Administered 2018-02-25: 10000 [IU] via SUBCUTANEOUS
  Filled 2018-02-25: qty 1

## 2018-02-25 MED ORDER — SODIUM CHLORIDE 0.9 % IV SOLN
370.4000 mg | Freq: Once | INTRAVENOUS | Status: AC
  Administered 2018-02-25: 370 mg via INTRAVENOUS
  Filled 2018-02-25: qty 37

## 2018-02-25 NOTE — Progress Notes (Signed)
No new changes noted today 

## 2018-02-25 NOTE — Progress Notes (Signed)
[facility] day:  02/25/2018   Chief Complaint: Patrick Mcgee is a 72 y.o. male with metastatic urothelial carcinoma who is seen for review of interval restaging studies and assessment prior to day 1 of cycle #4 gemcitabine.  HPI:  Patrick Mcgee patient was last seen in the medical oncology clinic on 02/11/2018.  At that time, patient was doing well overall. He noted some urinary fullness.  He had constipation that was controlled with OTC interventions. Pain was well controlled with APAP only. Exam was stable. WBC was 3200 ([place] 2000). Hemoglobin was 9.4.  Platelet count was 167,000. Renal and hepatic function were normal.  B12 was 983.  Urinlaysis was negative.  He received day 8 gemcitabine.  Chest, abdomen, and pelvic CT on 02/22/2018 revealed marked improvement in nodal metastatic disease in the abdomen/pelvis. There continued to be enhancing material similar to prior in the lower pole collecting system on the left, with hypoenhancement of the subtended parenchyma in the lower third of the left kidney suggesting poor urinary drainage from underlying tumor.  There was no new metastatic lesions are identified.  There was third spacing of fluid with subcutaneous and mesenteric edema, and increasing ground-glass opacities in the lungs which may reflect low-grade alveolar edema notably in the lung bases.  There was continued interstitial accentuation peripherally in the lungs potentially from usual interstitial pneumonitis or non-specific interstitial pneumonitis.  There was diffuse wall thickening in the esophagus potentially from esophagitis.  Other imaging findings of potential clinical significance: aortic atherosclerosis, coronary atherosclerosis, cholelithiasis, contracted gallbladder, prominent stool throughout the colon favors constipation, and mild prostatomegaly  During the interim, patient is doing well overall. He states, "I feel great. It is like I am not doing  any kind of treatments". Chronic fatigue persists. Wife notes that patient is still napping a lot during the day. Patient has a chronic cough with intermittent phlegm production. Due to the cough, patient has developed a "chest discomfort", which he describes as being like "when I get a sinus infection". Patient having issues with his CPAP. Patient states, "I often smell the oxidation smell in the mask". Patient has been cleaning his basement, which he notes has "quite a bit of mold".   Patient is bowel obsessed and under the impression that he has to have a bowel movement everyday. If not bowel movement daily, patient feels the need to utilize interventions, up to and including, Fleet's enemas. Patient denies that he has experienced any B symptoms. He denies any interval infections. Patient advises that he maintains an adequate appetite. He is eating well. Weight today is 172 lb 6.4 oz (78.2 kg), which compared to his last visit to the clinic, represents a  5 pound increase.   Patient denies pain in the clinic today.   Patient is going to [facility] from 03/30/2018 - 04/06/2018.   Past Medical History:  Diagnosis Date  . Acid reflux   . Anxiety   . Depression   . Diabetes mellitus without complication ([place])   . Hyperlipidemia   . Hypertension   . Myocardial infarction ([place]) 1995  . Sleep apnea   . Urothelial cancer ([place]) 11/2017   Left Urothelial mass, chemo tx's    Past Surgical History:  Procedure Laterality Date  . CATARACT EXTRACTION Left   . COLONOSCOPY  2010   [facility]  . COLONOSCOPY WITH PROPOFOL N/A 10/04/2016   Procedure: COLONOSCOPY WITH PROPOFOL;  Surgeon: [name], MD;  Location: [facility]  ENDOSCOPY;  Service: Endoscopy;  Laterality: N/A;  . [place], 2000, 2001, 2014  . CYSTOSCOPY W/ RETROGRADES Bilateral 12/10/2017   Procedure: CYSTOSCOPY WITH RETROGRADE PYELOGRAM;  Surgeon: [name], MD;  Location: [facility] ORS;  Service:  Urology;  Laterality: Bilateral;  . CYSTOSCOPY WITH STENT PLACEMENT Left 12/10/2017   Procedure: CYSTOSCOPY WITH STENT PLACEMENT;  Surgeon: [name], MD;  Location: [facility] ORS;  Service: Urology;  Laterality: Left;  . INGUINAL HERNIA REPAIR Right 08/09/2015   Procedure: HERNIA REPAIR INGUINAL ADULT;  Surgeon: [name], MD;  Location: [facility] ORS;  Service: General;  Laterality: Right;  . NASAL SINUS SURGERY    . nuclear stress test    . PORTA CATH INSERTION N/A 12/19/2017   Procedure: PORTA CATH INSERTION;  Surgeon: [name], MD;  Location: [facility] CV LAB;  Service: Cardiovascular;  Laterality: N/A;  . URETERAL BIOPSY Left 12/10/2017   Procedure: URETERAL & renal PELVIS BIOPSY;  Surgeon: [name], MD;  Location: [facility] ORS;  Service: Urology;  Laterality: Left;  . URETEROSCOPY Left 12/10/2017   Procedure: URETEROSCOPY;  Surgeon: [name], MD;  Location: [facility] ORS;  Service: Urology;  Laterality: Left;    Family History  Problem Relation Age of Onset  . Heart disease Mother   . Cancer Father        Lung and colon cancer  . Heart disease Father   . Emphysema Maternal Grandfather   . Tuberculosis Maternal Grandmother     Social History:  reports that he has never smoked. He has quit using smokeless tobacco.  His smokeless tobacco use included chew. He reports that he drinks about 1.0 - 5.0 standard drinks of alcohol per week. He reports that he does not use drugs.  Patient is retired from the [employer]. Patient denies known exposures to radiation on toxins. The patient has a family trip planned to [facility] between 03/30/2018 - 04/06/2018. The patient is accompanied by his wife, Levander Mcgee, today.  Allergies:  Allergies  Allergen Reactions  . Sulfa Antibiotics Other (See Comments)    Joint pain  . Ace Inhibitors Cough  . Invokana [Canagliflozin] Other (See Comments)    Leg pain  . Penicillins Rash    Has patient had a PCN reaction causing immediate rash,  facial/tongue/throat swelling, SOB or lightheadedness with hypotension: Yes Has patient had a PCN reaction causing severe rash involving mucus membranes or skin necrosis: No Has patient had a PCN reaction that required hospitalization: No Has patient had a PCN reaction occurring within the last 10 years: No If all of the above answers are "NO", then may proceed with Cephalosporin use.    Current Medications: Current Outpatient Medications  Medication Sig Dispense Refill  . atorvastatin (LIPITOR) 40 MG tablet TAKE ONE TABLET BY MOUTH ONCE DAILY (Patient taking differently: TAKE ONE TABLET BY MOUTH DAILY AT BEDTIME) 90 tablet 3  . carvedilol (COREG) 12.5 MG tablet TAKE ONE TABLET BY MOUTH TWICE DAILY 180 tablet 3  . feeding supplement, ENSURE ENLIVE, (ENSURE ENLIVE) LIQD Take 237 mLs by mouth 3 (three) times daily between meals. 90 Bottle 0  . fentaNYL (DURAGESIC - DOSED MCG/HR) 25 MCG/HR patch Place 1 patch (25 mcg total) onto the skin every 3 (three) days. 10 patch 0  . glucose blood (CONTOUR NEXT TEST) test strip Checks sugar 4 times daily. DX E11.9-strips for Contour next EZ meter 360 each 3  . linaclotide (LINZESS) 290 MCG CAPS capsule Take 1  capsule (290 mcg total) by mouth daily before breakfast. 30 capsule 0  . losartan (COZAAR) 100 MG tablet TAKE ONE TABLET BY MOUTH ONCE DAILY (Patient taking differently: TAKE ONE TABLET BY MOUTH DAILY AT BEDTIME) 90 tablet 3  . metFORMIN (GLUCOPHAGE) 1000 MG tablet Take 1 tablet (1,000 mg total) by mouth 2 (two) times daily with a meal. 180 tablet 3  . sodium chloride 1 g tablet Take 1 tablet (1 g total) by mouth 3 (three) times daily with meals. 15 tablet 0  . tamsulosin (FLOMAX) 0.4 MG CAPS capsule Take 1 capsule (0.4 mg total) by mouth daily. 30 capsule 0  . acetaminophen (TYLENOL) 500 MG tablet Take 500 mg by mouth every 6 (six) hours as needed for moderate pain. When taking oxycodone 5 mg tablets    . ALPRAZolam (XANAX) 0.25 MG tablet Take 1 tablet  (0.25 mg total) by mouth 3 (three) times daily as needed for anxiety. (Patient not taking: Reported on 02/25/2018) 20 tablet 0  . clobetasol cream (TEMOVATE) 1.70 % Apply 1 application topically as needed (for skin rash). (Patient not taking: Reported on 02/25/2018) 30 g 1  . doxepin (SINEQUAN) 10 MG capsule Take 1 capsule (10 mg total) by mouth 4 (four) times daily as needed. (Patient not taking: Reported on 02/25/2018) 120 capsule 5  . fluticasone (FLONASE) 50 MCG/ACT nasal spray Place 2 sprays into both nostrils daily. (Patient not taking: Reported on 02/25/2018) 16 g 12  . HYDROmorphone (DILAUDID) 2 MG tablet Take 1 tablet (2 mg total) by mouth every 4 (four) hours as needed for severe pain (breakthrough pain). (Patient not taking: Reported on 02/25/2018) 40 tablet 0  . Ivermectin (SOOLANTRA) 1 % CREA Apply topically at bedtime. To face    . lactulose (CEPHULAC) 20 g packet Take 1 packet (20 g total) by mouth 2 (two) times daily as needed (constipation). (Patient not taking: Reported on 02/25/2018) 60 each 0  . lidocaine-prilocaine (EMLA) cream Apply 1 application topically as needed. (Patient not taking: Reported on 02/25/2018) 30 g 3  . meclizine (ANTIVERT) 25 MG tablet Take 25 mg by mouth 3 (three) times daily as needed for dizziness.    . nystatin cream (MYCOSTATIN) APPLY  CREAM TOPICALLY TWICE DAILY AROUND  THE  GROIN  AREA (Patient not taking: Reported on 02/25/2018) 15 g 1  . ondansetron (ZOFRAN) 8 MG tablet Take 1 tablet (8 mg total) by mouth every 8 (eight) hours as needed for nausea or vomiting (if needed beginning 3 days after chemotherapy). (Patient not taking: Reported on 02/25/2018) 20 tablet 0  . oxyCODONE (OXYCONTIN) 15 mg 12 hr tablet Take 1 tablet (15 mg total) by mouth every 12 (twelve) hours. (Patient not taking: Reported on 02/25/2018) 20 tablet 0  . polyethylene glycol (MIRALAX) packet Take 17 g by mouth 2 (two) times daily. (Patient not taking: Reported on 02/25/2018) 14 each 0   No  current facility-administered medications for this visit.     Review of Systems  Constitutional: Positive for malaise/fatigue ("napping" a lot during the day). Negative for diaphoresis, fever and weight loss (weight up 5 pounds).       "I feel great".   HENT: Positive for congestion (phlegm).   Eyes: Negative.   Respiratory: Positive for cough and sputum production. Negative for hemoptysis and shortness of breath.   Cardiovascular: Negative for chest pain, palpitations, orthopnea, leg swelling and PND.       MI s/p 5-6 stents (last in 2015). On 3 antihypertensive medications. 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   Gastrointestinal: Positive for constipation (daily MOM and stool softeners) and heartburn (on PPI). Negative for abdominal pain, blood in stool, diarrhea, melena, nausea and vomiting.  Genitourinary: Negative for dysuria, frequency, hematuria and urgency.  Musculoskeletal: Positive for back pain (chronic - controlled). Negative for falls, joint pain and myalgias.  Skin: Positive for itching (chronic) and rash (recurrent sub-pannicular (intertriginous) rash - uses Nystatin topical PRN).  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 Negative for dizziness, tremors, weakness and headaches.  Endo/Heme/Allergies: Positive for environmental allergies (seasonal allergies). Does not bruise/bleed easily.       Diabetes  Psychiatric/Behavioral: Negative for depression, memory loss and suicidal ideas. The patient is not nervous/anxious and does not have insomnia.   All other systems reviewed and are negative.  Performance status (ECOG): 2 - Symptomatic, <50% confined to bed  Vital signs BP (!) 150/82 (Patient Position: Sitting)   Pulse 62   Temp (!) 97.2 F (36.2 C) (Tympanic)   Resp 18   Ht _0  (1.727 m)   Wt 172 lb 6.4 oz (78.2 kg)   SpO2 97%   BMI 26.21 kg/m   Physical Exam  Constitutional: He is oriented to person, place, and time and  well-developed, well-nourished, and in no distress.  HENT:  Head: Normocephalic and atraumatic.  Thin gray hair  Eyes: Pupils are equal, round, and reactive to light. EOM are normal. No scleral icterus.  Brown eyes.   Neck: Normal range of motion. Neck supple. No tracheal deviation present. No thyromegaly present.  Cardiovascular: Normal rate, regular rhythm and normal heart sounds. Exam reveals no gallop and no friction rub.  No murmur heard. Pulmonary/Chest: Effort normal and breath sounds normal. No respiratory distress. He has no wheezes. He has no rales.  Abdominal: Soft. Bowel sounds are normal. He exhibits no distension. There is no tenderness.  Musculoskeletal: Normal range of motion. He exhibits no edema or tenderness.  Lymphadenopathy:    He has no cervical adenopathy.    He has no axillary adenopathy.       Right: No inguinal and no supraclavicular adenopathy present.       Left: No inguinal and no supraclavicular adenopathy present.  Neurological: He is alert and oriented to person, place, and time.  Skin: Skin is warm and dry. No rash noted. No erythema.  Psychiatric: Mood, affect and judgment normal.  Nursing note and vitals reviewed.   Appointment on 02/25/2018  Component Date Value Ref Range Status  . Magnesium 02/25/2018 2.0  1.7 - 2.4 mg/dL Final   Performed at [facility], [address] [place],  [address]  . Sodium 02/25/2018 137  135 - 145 mmol/L Final  . Potassium 02/25/2018 4.1  3.5 - 5.1 mmol/L Final  . Chloride 02/25/2018 105  98 - 111 mmol/L Final  . CO2 02/25/2018 25  22 - 32 mmol/L Final  . Glucose, Bld 02/25/2018 151* 70 - 99 mg/dL Final  . BUN 02/25/2018 12  8 - 23 mg/dL Final  . Creatinine, Ser 02/25/2018 0.63  0.61 - 1.24 mg/dL Final  . Calcium 02/25/2018 9.1  8.9 - 10.3 mg/dL Final  . Total Protein 02/25/2018 6.5  6.5 - 8.1 g/dL Final  . Albumin 02/25/2018 3.6  3.5 - 5.0 g/dL Final  . AST 02/25/2018 18  15 - 41 U/L Final  . ALT  02/25/2018 14  0 - 44 U/L Final  . Alkaline Phosphatase 02/25/2018 76  38 - 126 U/L Final  . Total Bilirubin 02/25/2018 1.1  0.3 - 1.2 mg/dL Final  . GFR calc non Af Amer 02/25/2018 >60  >60 mL/min Final  . GFR calc Af Amer 02/25/2018 >60  >60 mL/min Final   Comment: (NOTE) The eGFR has been calculated using the CKD EPI equation. This calculation has not been validated in all clinical situations. eGFR's persistently <60 mL/min signify possible Chronic Kidney Disease.   Georgiann Hahn gap 02/25/2018 7  5 - 15 Final   Performed at [facility], [address] [place], [place] [address]  . WBC 02/25/2018 3.6* 3.8 - 10.6 K/uL Final  . RBC 02/25/2018 2.85* 4.40 - 5.90 MIL/uL Final  . Hemoglobin 02/25/2018 9.2* 13.0 - 18.0 g/dL Final  . HCT 02/25/2018 26.8* 40.0 - 52.0 % Final  . MCV 02/25/2018 94.0  80.0 - 100.0 fL Final  . MCH 02/25/2018 32.2  26.0 - 34.0 pg Final  . MCHC 02/25/2018 34.2  32.0 - 36.0 g/dL Final  . RDW 02/25/2018 19.7* 11.5 - 14.5 % Final  . Platelets 02/25/2018 166  150 - 440 K/uL Final  . Neutrophils Relative % 02/25/2018 57  % Final  . Neutro Abs 02/25/2018 2.0  1.4 - 6.5 K/uL Final  . Lymphocytes Relative 02/25/2018 22  % Final  . Lymphs Abs 02/25/2018 0.8* 1.0 - 3.6 K/uL Final  . Monocytes Relative 02/25/2018 17  % Final  . Monocytes Absolute 02/25/2018 0.6  0.2 - 1.0 K/uL Final  . Eosinophils Relative 02/25/2018 3  % Final  . Eosinophils Absolute 02/25/2018 0.1  0 - 0.7 K/uL Final  . Basophils Relative 02/25/2018 1  % Final  . Basophils Absolute 02/25/2018 0.0  0 - 0.1 K/uL Final   Performed at [facility], [address] [place],  [address]    Assessment:  TOSHIO SLUSHER is a 72 y.o. 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  He presented with abdominal pain and weight loss.  Abdomen and pelvic CT on 12/01/2017 revealed a suspicious enhancing, filling defect involving the lower pole collecting  system of the left kidney with asymmetric hypoenhancement of the inferior pole cortex of left kidney noted. Findings were worrisome for urothelial neoplasm.  There was abdominal and pelvic adenopathy compatible with nodal metastasis.  There was a 2.6 cm right para celiac node, 1,6 cm left retroperitoneal node, 1.4 cm right retroperitoneal node, 2.1 cm right common iliac node and 1 cm left external iliac node.  There was aortic atherosclerosis, kidney cysts, and prostate gland enlargement.  Chest CT on 12/07/2017 revealed no evidence of metastatic disease.    He underwent left ureteroscopy with renal pelvis biopsy and left ureteral stent placement on 12/10/2017.  Findings included a large nodular, vascular tumor within the left lower pole collecting system completely obstructing the entire left lower pole moiety.  Ureter, right kidney, and bladder was normal.  Pathology revealed small fragments of high-grade urothelial carcinoma with a small focus of invasion.  He is s/p 3 cycles of carboplatin + gemcitabine (12/21/2017 - 02/04/2018). He is tolerating treatment well.   Chest, abdomen, and pelvic CT on 02/22/2018 revealed marked improvement in nodal metastatic disease in the abdomen/pelvis. There continued to be enhancing material similar to prior in the lower pole collecting system on the left, with hypoenhancement of the subtended parenchyma in the lower third of the left kidney suggesting poor urinary drainage from underlying tumor.  There was no  new metastatic lesions are identified.   He has B12 deficiency.  B12 was 247 on 12/31/2017.  He began oral B12 on 01/07/2018.  B12 was 983 on 02/11/2018.  He has diabetes and coronary artery disease s/p MI in 1995.  He is s/p stent placement x 5-6.  Colonoscopy in 2018 was negative.  Symptomatically, patient is doing well overall. He has a persistent cough. He is questioning issues related to CPAP "irritant" exposure vs. mold exposure while cleaning the  basement.  Patient denies B symptoms or interval symptoms.  He has constipation that is controlled with OTC interventions. Pain is well controlled with APAP only. Exam is stable. WBC 3600 ([place] 2000). Hemoglobin is 9.2.  Platelet count is 166,000. Renal and hepatic function normal.   Plan: 1. Labs today:  CBC with diff, CMP, Mg. 2. Metastatic urothelial carcinoma - treatment ongoing  Doing well with treatments. Minimal side effects.   Labs reviewed. Blood counts stable and adequate enough for treatment. Will proceed with day 8 of cycle #3 gemcitabine.  Chest, abdomen, and pelvic CT scans reveal decreasing adenopathy.  Scans personally reviewed.  I agree with the radiology interpretation. Discuss symptom management.  Patient has antiemetics and pain medications at home to use on a PRN basis. Patient  advising that the  prescribed interventions are adequate at this point. Continue all medications as previously prescribed.  3. Chemotherapy induced neutropenia - stable  Counts stable. [place] 2000.  Review potential need for count support with GCSF injections.   Discussed need to maintain neutropenic precautions, including the need for patient to avoid sick contacts and practice strict handwashing. 4. Chemotherapy induced anemia   Hemoglobin 9.2, hematocrit 26.8, MCV 94.0, and platelet 166,000.  Discuss adding Procrit injections to support patient declining blood counts 2 weeks.    Procrit preauthorized by financials.  Patient to receive first Procrit 10,000 unit injection today. 5. Cough - chronic  Patient with persistent cough and intermittent intermittent shortness of breath.  Questioning exposure to mold versus respiratory irritant initiated with CPAP cleaning.  Discussed referral to pulmonary medicine for further evaluation.  Patient in agreement.  Will refer patient to Dr. Ashby Dawes for further assessment. 6. Protein calorie malnutrition - improving  Patient eating better. Weight up 5  pounds since last visit. His weight today is 172 lb 6.4 oz (78.2 kg).  Patient describes an adequate appetite with adequate intake of calorie and protein gets food choices.  Patient to continue to follow with cancer center RD. 7. HYPOmagnesemia - stable  Magnesium level remains stable at 2.0.  Patient to continue oral Mag-Ox 400 mg every other day as previously prescribed. 8. B12 deficiency - treatment ongoing  Patient remains on oral B12 1,000 mg supplementation (began on 01/07/2018).  B12 level rechecked on 02/11/2018 and found to be 983.  Continue oral supplementation as previously prescribed. 9. Care coordination with outpatient palliative medicine - ongoing  Patient continues to be followed by outpatient out of medicine team.  No immediate needs identified at this time.  We will continue to coordinate care with palliative medicine. 10. RTC on 03/04/2018 for MD assessment, labs (CBC with diff, CMP, Mg), and day 8 of cycle #4 gemcitabine.  11. RTC on 03/07/2018 for labs (CBC with diff) and +/- GCSF.  12. RTC on 03/18/2018 for MD assessment, labs (CBC with diff, CMP, Mg), and day 1of cycle #5 carboplatin + gemcitabine.    Honor Loh, NP  02/25/18, 10:15 AM   I saw and evaluated the  patient, participating in the key portions of the service and reviewing pertinent diagnostic studies and records.  I reviewed the nurse practitioner's note and agree with the findings and the plan.  The assessment and plan were discussed with the patient.  Multiple questions were asked by the patient and answered.   Nolon Stalls, MD 02/25/2018,10:15 AM

## 2018-02-27 ENCOUNTER — Ambulatory Visit (INDEPENDENT_AMBULATORY_CARE_PROVIDER_SITE_OTHER): Payer: Medicare Other | Admitting: Family Medicine

## 2018-02-27 ENCOUNTER — Encounter: Payer: Self-pay | Admitting: Family Medicine

## 2018-02-27 VITALS — BP 122/67 | HR 61 | Temp 98.6°F | Resp 16 | Wt 172.0 lb

## 2018-02-27 DIAGNOSIS — E119 Type 2 diabetes mellitus without complications: Secondary | ICD-10-CM

## 2018-02-27 DIAGNOSIS — C689 Malignant neoplasm of urinary organ, unspecified: Secondary | ICD-10-CM

## 2018-02-27 DIAGNOSIS — I2510 Atherosclerotic heart disease of native coronary artery without angina pectoris: Secondary | ICD-10-CM | POA: Diagnosis not present

## 2018-02-27 LAB — POCT GLYCOSYLATED HEMOGLOBIN (HGB A1C): HEMOGLOBIN A1C: 7.2 % — AB (ref 4.0–5.6)

## 2018-02-27 NOTE — Progress Notes (Signed)
[facility] Consultation      Assessment and Plan:  Interstitial lung disease. - Mild interstitial edema seen in the lung bases a CT chest, suspect that this is consistent with early or mild interstitial lung disease. - We will send for serological work-up, pulmonary function test. -We will send for 2D echocardiogram to look for evidence of cardiac issues which may cause pulmonary edema. -High-resolution CT chest.  Obstructive sleep apnea. - Currently doing well on CPAP, new machine in 2018. - Continue to use CPAP every night.  Orders Placed This Encounter  Procedures  . CT CHEST HIGH RESOLUTION  . Angiotensin converting enzyme  . ANCA screen with reflex titer  . ANA+ENA+DNA/DS+Scl 70+SjoSSA/B  . Pulmonary Function Test [facility] Only  . ECHOCARDIOGRAM COMPLETE   Return in about 3 months (around 05/31/2018).   Date: 02/28/2018  MRN# [id number] Patrick Mcgee 08-26-1945    Patrick Mcgee is a 72 y.o. old male seen in consultation for chief complaint of:    Chief Complaint  Patient presents with  . Consult    Referred by Dr. [name] for eval of cough.Pt had CT chest 8/16   . Cough    with clear to white, yellow mucus. He had abn CT which showed fluid. Pt denies sob,wheezing and or chest tightness.    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 He recently had a followup up scan which suggested interstitial edema in the lung bases.  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 He has worked in [employer] for 2 years. He has a lot of allergies to dust and grass. He has constant sinus drainage. He denies reflux.  She has OSA and has been on CPAP for about 20 years, last new machine was about a year ago, he uses soclean daily.  He has no pets at home, no immunological disease.  He  last smoked about 50 years ago, he smoked cigars.   He has been diagnosed with COPD in the past. His grandfather had TB.  He denies trouble breathing with activity. He is not limited by his breathing.   CT chest 02/22/2018>> images personally reviewed, enlarged right paratracheal node, cardiomegaly.  Mild increased interstitial markings in the lung bases suggestive of early/mild pulmonary fibrotic changes.  PMHX:   Past Medical History:  Diagnosis Date  . Acid reflux   . Anxiety   . Depression   . Diabetes mellitus without complication ([place])   . Hyperlipidemia   . Hypertension   . Myocardial infarction ([place]) 1995  . Sleep apnea   . Urothelial cancer ([place]) 11/2017   Left Urothelial mass, chemo tx's   Surgical Hx:  Past Surgical History:  Procedure Laterality Date  . CATARACT EXTRACTION Left   . COLONOSCOPY  2010   [facility]  . COLONOSCOPY WITH PROPOFOL N/A 10/04/2016   Procedure: COLONOSCOPY WITH PROPOFOL;  Surgeon: [name], MD;  Location: [facility] ENDOSCOPY;  Service: Endoscopy;  Laterality: N/A;  . [place], 2000, 2001, 2014  . CYSTOSCOPY W/ RETROGRADES Bilateral 12/10/2017   Procedure: CYSTOSCOPY WITH RETROGRADE PYELOGRAM;  Surgeon: [name], MD;  Location: [facility] ORS;  Service: Urology;  Laterality: Bilateral;  . CYSTOSCOPY WITH STENT PLACEMENT Left 12/10/2017   Procedure: CYSTOSCOPY WITH STENT  PLACEMENT;  Surgeon: [name], MD;  Location: [facility] ORS;  Service: Urology;  Laterality: Left;  . INGUINAL HERNIA REPAIR Right 08/09/2015   Procedure: HERNIA REPAIR INGUINAL ADULT;  Surgeon: [name], MD;  Location: [facility] ORS;  Service: General;  Laterality: Right;  . NASAL SINUS SURGERY    . nuclear stress test    . PORTA CATH INSERTION N/A 12/19/2017   Procedure: PORTA CATH INSERTION;  Surgeon: [name], MD;  Location: [facility] LAB;  Service: Cardiovascular;  Laterality: N/A;  . URETERAL BIOPSY Left 12/10/2017    Procedure: URETERAL & renal PELVIS BIOPSY;  Surgeon: [name], MD;  Location: [facility] ORS;  Service: Urology;  Laterality: Left;  . URETEROSCOPY Left 12/10/2017   Procedure: URETEROSCOPY;  Surgeon: [name], MD;  Location: [facility] ORS;  Service: Urology;  Laterality: Left;   Family Hx:  Family History  Problem Relation Age of Onset  . Heart disease Mother   . Cancer Father        Lung and colon cancer  . Heart disease Father   . Emphysema Maternal Grandfather   . Tuberculosis Maternal Grandmother    Social Hx:   Social History   Tobacco Use  . Smoking status: Never Smoker  . Smokeless tobacco: Former Systems developer    Types: Chew  Substance Use Topics  . Alcohol use: Yes    Alcohol/week: 1.0 - 5.0 standard drinks    Types: 1 Standard drinks or equivalent per week    Comment: rarely  . Drug use: No   Medication:    Current Outpatient Medications:  .  acetaminophen (TYLENOL) 500 MG tablet, Take 500 mg by mouth every 6 (six) hours as needed for moderate pain. When taking oxycodone 5 mg tablets, Disp: , Rfl:  .  ALPRAZolam (XANAX) 0.25 MG tablet, Take 1 tablet (0.25 mg total) by mouth 3 (three) times daily as needed for anxiety., Disp: 20 tablet, Rfl: 0 .  atorvastatin (LIPITOR) 40 MG tablet, TAKE ONE TABLET BY MOUTH ONCE DAILY (Patient taking differently: TAKE ONE TABLET BY MOUTH DAILY AT BEDTIME), Disp: 90 tablet, Rfl: 3 .  carvedilol (COREG) 12.5 MG tablet, TAKE ONE TABLET BY MOUTH TWICE DAILY, Disp: 180 tablet, Rfl: 3 .  clobetasol cream (TEMOVATE) 6.28 %, Apply 1 application topically as needed (for skin rash)., Disp: 30 g, Rfl: 1 .  doxepin (SINEQUAN) 10 MG capsule, Take 1 capsule (10 mg total) by mouth 4 (four) times daily as needed., Disp: 120 capsule, Rfl: 5 .  feeding supplement, ENSURE ENLIVE, (ENSURE ENLIVE) LIQD, Take 237 mLs by mouth 3 (three) times daily between meals., Disp: 90 Bottle, Rfl: 0 .  fentaNYL (DURAGESIC - DOSED MCG/HR) 25 MCG/HR patch, Place 1 patch (25 mcg  total) onto the skin every 3 (three) days., Disp: 10 patch, Rfl: 0 .  fluticasone (FLONASE) 50 MCG/ACT nasal spray, Place 2 sprays into both nostrils daily., Disp: 16 g, Rfl: 12 .  glucose blood (CONTOUR NEXT TEST) test strip, Checks sugar 4 times daily. DX E11.9-strips for Contour next EZ meter, Disp: 360 each, Rfl: 3 .  HYDROmorphone (DILAUDID) 2 MG tablet, Take 1 tablet (2 mg total) by mouth every 4 (four) hours as needed for severe pain (breakthrough pain)., Disp: 40 tablet, Rfl: 0 .  Ivermectin (SOOLANTRA) 1 % CREA, Apply topically at bedtime. To face, Disp: , Rfl:  .  lactulose (CEPHULAC) 20 g packet, Take 1 packet (20 g total) by mouth 2 (two) times daily as needed (constipation).,  Disp: 60 each, Rfl: 0 .  lidocaine-prilocaine (EMLA) cream, Apply 1 application topically as needed., Disp: 30 g, Rfl: 3 .  linaclotide (LINZESS) 290 MCG CAPS capsule, Take 1 capsule (290 mcg total) by mouth daily before breakfast., Disp: 30 capsule, Rfl: 0 .  losartan (COZAAR) 100 MG tablet, TAKE ONE TABLET BY MOUTH ONCE DAILY (Patient taking differently: TAKE ONE TABLET BY MOUTH DAILY AT BEDTIME), Disp: 90 tablet, Rfl: 3 .  meclizine (ANTIVERT) 25 MG tablet, Take 25 mg by mouth 3 (three) times daily as needed for dizziness., Disp: , Rfl:  .  metFORMIN (GLUCOPHAGE) 1000 MG tablet, Take 1 tablet (1,000 mg total) by mouth 2 (two) times daily with a meal., Disp: 180 tablet, Rfl: 3 .  nystatin cream (MYCOSTATIN), APPLY  CREAM TOPICALLY TWICE DAILY AROUND  THE  GROIN  AREA, Disp: 15 g, Rfl: 1 .  ondansetron (ZOFRAN) 8 MG tablet, Take 1 tablet (8 mg total) by mouth every 8 (eight) hours as needed for nausea or vomiting (if needed beginning 3 days after chemotherapy)., Disp: 20 tablet, Rfl: 0 .  oxyCODONE (OXYCONTIN) 15 mg 12 hr tablet, Take 1 tablet (15 mg total) by mouth every 12 (twelve) hours., Disp: 20 tablet, Rfl: 0 .  polyethylene glycol (MIRALAX) packet, Take 17 g by mouth 2 (two) times daily., Disp: 14 each, Rfl:  0 .  sodium chloride 1 g tablet, Take 1 tablet (1 g total) by mouth 3 (three) times daily with meals., Disp: 15 tablet, Rfl: 0 .  tamsulosin (FLOMAX) 0.4 MG CAPS capsule, Take 1 capsule (0.4 mg total) by mouth daily., Disp: 30 capsule, Rfl: 0   Allergies:  Sulfa antibiotics; Ace inhibitors; Invokana [canagliflozin]; and Penicillins  Review of Systems: Gen:  Denies  fever, sweats, chills HEENT: Denies blurred vision, double vision. bleeds, sore throat Cvc:  No dizziness, chest pain. Resp:   Denies cough or sputum production, shortness of breath Gi: Denies swallowing difficulty, stomach pain. Gu:  Denies bladder incontinence, burning urine Ext:   No Joint pain, stiffness. Skin: No skin rash,  hives  Endoc:  No polyuria, polydipsia. Psych: No depression, insomnia. Other:  All other systems were reviewed with the patient and were negative other that what is mentioned in the HPI.   Physical Examination:   VS: BP 128/70 (BP Location: Left Arm, Cuff Size: Normal)   Pulse 63   Resp 16   Ht 5\' 8"  (1.727 m)   Wt 168 lb (76.2 kg)   SpO2 98%   BMI 25.54 kg/m   General Appearance: No distress  Neuro:without focal findings,  speech normal,  HEENT: PERRLA, EOM intact.   Pulmonary: normal breath sounds, No wheezing.  CardiovascularNormal S1,S2.  No m/r/g.   Abdomen: Benign, Soft, non-tender. Renal:  No costovertebral tenderness  GU:  No performed at this time. Endoc: No evident thyromegaly, no signs of acromegaly. Skin:   warm, no rashes, no ecchymosis  Extremities: normal, no cyanosis, clubbing.  Other findings:    LABORATORY PANEL:   CBC Recent Labs  Lab 02/25/18 0928  WBC 3.6*  HGB 9.2*  HCT 26.8*  PLT 166   ------------------------------------------------------------------------------------------------------------------  Chemistries  Recent Labs  Lab 02/25/18 0928  NA 137  K 4.1  CL 105  CO2 25  GLUCOSE 151*  BUN 12  CREATININE 0.63  CALCIUM 9.1  MG 2.0   AST 18  ALT 14  ALKPHOS 76  BILITOT 1.1   ------------------------------------------------------------------------------------------------------------------  Cardiac Enzymes No results for input(s): TROPONINI in  the last 168 hours. ------------------------------------------------------------  RADIOLOGY:  No results found.     Thank  you for the consultation and for allowing [facility] to assist in the care of your patient. Our recommendations are noted above.  Please contact us if we can be of further service.   Marda Stalker, M.D., F.C.C.P.  Board Certified in Internal Medicine, Pulmonary Medicine, [place], and Sleep Medicine.  [facility] Office Number: [phone]   02/28/2018

## 2018-02-27 NOTE — Progress Notes (Signed)
Patient: Patrick Mcgee Male    DOB: 22-May-1946   72 y.o.   MRN: [id number] Visit Date: 02/27/2018  Today's Provider: Wilhemena Durie, MD   Chief Complaint  Patient presents with  . Diabetes   Subjective:    HPI Patient comes in today for a follow up. He was last seen in the office 1 month ago. He was started on Doxepin 10mg  QID as needed. Patient reports that he is no longer taking this.   Patient is also due for a recheck of his diabetes. He reports that his blood sugars have been running high due his chemo treatments. He reports that it has been up to 300.  Lab Results  Component Value Date   HGBA1C 7.2 (A) 02/27/2018        Allergies  Allergen Reactions  . Sulfa Antibiotics Other (See Comments)    Joint pain  . Ace Inhibitors Cough  . Invokana [Canagliflozin] Other (See Comments)    Leg pain  . Penicillins Rash    Has patient had a PCN reaction causing immediate rash, facial/tongue/throat swelling, SOB or lightheadedness with hypotension: Yes Has patient had a PCN reaction causing severe rash involving mucus membranes or skin necrosis: No Has patient had a PCN reaction that required hospitalization: No Has patient had a PCN reaction occurring within the last 10 years: No If all of the above answers are "NO", then may proceed with Cephalosporin use.     Current Outpatient Medications:  .  acetaminophen (TYLENOL) 500 MG tablet, Take 500 mg by mouth every 6 (six) hours as needed for moderate pain. When taking oxycodone 5 mg tablets, Disp: , Rfl:  .  atorvastatin (LIPITOR) 40 MG tablet, TAKE ONE TABLET BY MOUTH ONCE DAILY (Patient taking differently: TAKE ONE TABLET BY MOUTH DAILY AT BEDTIME), Disp: 90 tablet, Rfl: 3 .  carvedilol (COREG) 12.5 MG tablet, TAKE ONE TABLET BY MOUTH TWICE DAILY, Disp: 180 tablet, Rfl: 3 .  feeding supplement, ENSURE ENLIVE, (ENSURE ENLIVE) LIQD, Take 237 mLs by mouth 3 (three) times daily between meals., Disp: 90 Bottle, Rfl: 0 .   fentaNYL (DURAGESIC - DOSED MCG/HR) 25 MCG/HR patch, Place 1 patch (25 mcg total) onto the skin every 3 (three) days., Disp: 10 patch, Rfl: 0 .  glucose blood (CONTOUR NEXT TEST) test strip, Checks sugar 4 times daily. DX E11.9-strips for Contour next EZ meter, Disp: 360 each, Rfl: 3 .  Ivermectin (SOOLANTRA) 1 % CREA, Apply topically at bedtime. To face, Disp: , Rfl:  .  linaclotide (LINZESS) 290 MCG CAPS capsule, Take 1 capsule (290 mcg total) by mouth daily before breakfast., Disp: 30 capsule, Rfl: 0 .  losartan (COZAAR) 100 MG tablet, TAKE ONE TABLET BY MOUTH ONCE DAILY (Patient taking differently: TAKE ONE TABLET BY MOUTH DAILY AT BEDTIME), Disp: 90 tablet, Rfl: 3 .  meclizine (ANTIVERT) 25 MG tablet, Take 25 mg by mouth 3 (three) times daily as needed for dizziness., Disp: , Rfl:  .  metFORMIN (GLUCOPHAGE) 1000 MG tablet, Take 1 tablet (1,000 mg total) by mouth 2 (two) times daily with a meal., Disp: 180 tablet, Rfl: 3 .  sodium chloride 1 g tablet, Take 1 tablet (1 g total) by mouth 3 (three) times daily with meals., Disp: 15 tablet, Rfl: 0 .  tamsulosin (FLOMAX) 0.4 MG CAPS capsule, Take 1 capsule (0.4 mg total) by mouth daily., Disp: 30 capsule, Rfl: 0 .  ALPRAZolam (XANAX) 0.25 MG tablet, Take 1 tablet (  0.25 mg total) by mouth 3 (three) times daily as needed for anxiety. (Patient not taking: Reported on 02/25/2018), Disp: 20 tablet, Rfl: 0 .  clobetasol cream (TEMOVATE) 1.61 %, Apply 1 application topically as needed (for skin rash). (Patient not taking: Reported on 02/25/2018), Disp: 30 g, Rfl: 1 .  doxepin (SINEQUAN) 10 MG capsule, Take 1 capsule (10 mg total) by mouth 4 (four) times daily as needed. (Patient not taking: Reported on 02/25/2018), Disp: 120 capsule, Rfl: 5 .  fluticasone (FLONASE) 50 MCG/ACT nasal spray, Place 2 sprays into both nostrils daily. (Patient not taking: Reported on 02/25/2018), Disp: 16 g, Rfl: 12 .  HYDROmorphone (DILAUDID) 2 MG tablet, Take 1 tablet (2 mg total) by  mouth every 4 (four) hours as needed for severe pain (breakthrough pain). (Patient not taking: Reported on 02/25/2018), Disp: 40 tablet, Rfl: 0 .  lactulose (CEPHULAC) 20 g packet, Take 1 packet (20 g total) by mouth 2 (two) times daily as needed (constipation). (Patient not taking: Reported on 02/25/2018), Disp: 60 each, Rfl: 0 .  lidocaine-prilocaine (EMLA) cream, Apply 1 application topically as needed. (Patient not taking: Reported on 02/25/2018), Disp: 30 g, Rfl: 3 .  nystatin cream (MYCOSTATIN), APPLY  CREAM TOPICALLY TWICE DAILY AROUND  THE  GROIN  AREA (Patient not taking: Reported on 02/25/2018), Disp: 15 g, Rfl: 1 .  ondansetron (ZOFRAN) 8 MG tablet, Take 1 tablet (8 mg total) by mouth every 8 (eight) hours as needed for nausea or vomiting (if needed beginning 3 days after chemotherapy). (Patient not taking: Reported on 02/25/2018), Disp: 20 tablet, Rfl: 0 .  oxyCODONE (OXYCONTIN) 15 mg 12 hr tablet, Take 1 tablet (15 mg total) by mouth every 12 (twelve) hours. (Patient not taking: Reported on 02/25/2018), Disp: 20 tablet, Rfl: 0 .  polyethylene glycol (MIRALAX) packet, Take 17 g by mouth 2 (two) times daily. (Patient not taking: Reported on 02/25/2018), Disp: 14 each, Rfl: 0  Review of Systems  Constitutional: Negative.   HENT: Negative.   Respiratory: Negative.   Cardiovascular: Negative for chest pain, palpitations and leg swelling.  Endocrine: Negative for cold intolerance, heat intolerance and polyuria.  Musculoskeletal: Negative.   Neurological: Negative for dizziness and headaches.  Psychiatric/Behavioral: Negative.     Social History   Tobacco Use  . Smoking status: Never Smoker  . Smokeless tobacco: Former Systems developer    Types: Chew  Substance Use Topics  . Alcohol use: Yes    Alcohol/week: 1.0 - 5.0 standard drinks    Types: 1 Standard drinks or equivalent per week    Comment: rarely   Objective:   BP 122/67 (BP Location: Right Arm, Patient Position: Sitting, Cuff Size: Normal)  Comment: electronic cuff  Pulse 61   Temp 98.6 F (37 C)   Resp 16   Wt 172 lb (78 kg)   SpO2 100%   BMI 26.15 kg/m  Vitals:   02/27/18 1420  BP: 122/67  Pulse: 61  Resp: 16  Temp: 98.6 F (37 C)  SpO2: 100%  Weight: 172 lb (78 kg)     Physical Exam  Constitutional: He is oriented to person, place, and time. He appears well-developed and well-nourished.  HENT:  Head: Normocephalic and atraumatic.  Right Ear: External ear normal.  Left Ear: External ear normal.  Nose: Nose normal.  Eyes: Pupils are equal, round, and reactive to light. Conjunctivae are normal. No scleral icterus.  Neck: No thyromegaly present.  Cardiovascular: Normal rate, regular rhythm and normal heart sounds.  Pulmonary/Chest: Effort normal and breath sounds normal.  Abdominal: Soft.  Musculoskeletal: He exhibits no edema.  Lymphadenopathy:    He has no cervical adenopathy.  Neurological: He is alert and oriented to person, place, and time.  Skin: Skin is warm and dry.  Psychiatric: He has a normal mood and affect. His behavior is normal. Judgment and thought content normal.        Assessment & Plan:     1. Type 2 diabetes mellitus without complication, without long-term current use of insulin (HCC) Good control. - POCT glycosylated hemoglobin (Hb A1C)--7.2 2.Weight loss secondary to urothelial carcinoma Per oncology. Pt mood is positive.  I have done the exam and reviewed the chart and it is accurate to the best of my knowledge. Development worker, community has been used and  any errors in dictation or transcription are unintentional. [name] M.D. [place], MD  [facility]

## 2018-02-28 ENCOUNTER — Ambulatory Visit (INDEPENDENT_AMBULATORY_CARE_PROVIDER_SITE_OTHER): Payer: Medicare Other | Admitting: Internal Medicine

## 2018-02-28 ENCOUNTER — Encounter: Payer: Self-pay | Admitting: Internal Medicine

## 2018-02-28 VITALS — BP 128/70 | HR 63 | Resp 16 | Ht 68.0 in | Wt 168.0 lb

## 2018-02-28 DIAGNOSIS — I2510 Atherosclerotic heart disease of native coronary artery without angina pectoris: Secondary | ICD-10-CM | POA: Diagnosis not present

## 2018-02-28 DIAGNOSIS — J811 Chronic pulmonary edema: Secondary | ICD-10-CM

## 2018-02-28 DIAGNOSIS — J849 Interstitial pulmonary disease, unspecified: Secondary | ICD-10-CM

## 2018-02-28 DIAGNOSIS — R0609 Other forms of dyspnea: Secondary | ICD-10-CM

## 2018-02-28 NOTE — Patient Instructions (Signed)
Will send for blood work, lung function test.

## 2018-03-01 ENCOUNTER — Ambulatory Visit: Payer: Medicare Other

## 2018-03-03 DIAGNOSIS — D6481 Anemia due to antineoplastic chemotherapy: Secondary | ICD-10-CM | POA: Insufficient documentation

## 2018-03-03 DIAGNOSIS — R059 Cough, unspecified: Secondary | ICD-10-CM | POA: Insufficient documentation

## 2018-03-03 DIAGNOSIS — R05 Cough: Secondary | ICD-10-CM | POA: Insufficient documentation

## 2018-03-03 DIAGNOSIS — T451X5A Adverse effect of antineoplastic and immunosuppressive drugs, initial encounter: Secondary | ICD-10-CM

## 2018-03-03 NOTE — Progress Notes (Signed)
[facility] day:  03/04/2018   Chief Complaint: Patrick Mcgee is a 72 y.o. male with metastatic urothelial carcinoma who is seen for review of interval restaging studies and assessment prior to day 8 of cycle #4 gemcitabine.  HPI:  Patrick Mcgee patient was last seen in the medical oncology clinic on 02/25/2018.  At that time, patient notes that he felt "great" overall. He noted chronic fatigue and daytime napping. He was experiencing a chronic cough with intermittent phlegm production. Cough with accompanying pleuritic chest "discomfort". Cough attributed to smell (oxidation) associated with cleaning CPAP machine vs. exposure to mold while cleaning basement. Persistent bowel issues despite OTC interventions. Appetite stable; weight up 5 pounds. Pain continued to be controlled with APAP only. Exam stable. WBC 3600 ([place] 2000). Hemoglobin 9.2. Platelets 166,000. He received day 1 of cycle #4 carboplatin + gemcitabine.  He received Procrit 10,000 unit injection for chemotherapy induced anemia.    He was seen in consult on 02/28/2018 by Dr. [name] (pulmonary medicine). Notes reviewed. Patient felt to have mild interstitial lung disease. Additional lab studies, PFTs, echocardiogram, and high resolution CT scan ordered. Patient to follow up with pulmonary medicine in 3 months.   In the interim, patient doesn't feel well today. He notes that he started to feel "sick" on Sunday. Patient with increased fatigue, abdominal pain, and nausea/vomiting. He denies any fevers. He denies chest pain, shortness of breath, and palpitations. Patient states, "I am just sick on my stomach. It just aches".  His bowel remain "about the same". He continues to struggle with constipation. He is taking MOM everyday.  Patient notes that he is sedentary.   Patient advises that he maintains an adequate appetite. He is eating well. Weight today is 169 lb 3 oz (76.7 kg), which compared to  his last visit to the clinic, represents a 3 pound decrease.  Patient complains of pain rated 5/10 in the clinic today.  Patient is going to [facility] from 03/30/2018 - 04/06/2018.   Past Medical History:  Diagnosis Date  . Acid reflux   . Anxiety   . Depression   . Diabetes mellitus without complication ([place])   . Hyperlipidemia   . Hypertension   . Myocardial infarction ([place]) 1995  . Sleep apnea   . Urothelial cancer ([place]) 11/2017   Left Urothelial mass, chemo tx's    Past Surgical History:  Procedure Laterality Date  . CATARACT EXTRACTION Left   . COLONOSCOPY  2010   [facility]  . COLONOSCOPY WITH PROPOFOL N/A 10/04/2016   Procedure: COLONOSCOPY WITH PROPOFOL;  Surgeon: [name], MD;  Location: [facility] ENDOSCOPY;  Service: Endoscopy;  Laterality: N/A;  . [place], 2000, 2001, 2014  . CYSTOSCOPY W/ RETROGRADES Bilateral 12/10/2017   Procedure: CYSTOSCOPY WITH RETROGRADE PYELOGRAM;  Surgeon: [name], MD;  Location: [facility] ORS;  Service: Urology;  Laterality: Bilateral;  . CYSTOSCOPY WITH STENT PLACEMENT Left 12/10/2017   Procedure: CYSTOSCOPY WITH STENT PLACEMENT;  Surgeon: [name], MD;  Location: [facility] ORS;  Service: Urology;  Laterality: Left;  . INGUINAL HERNIA REPAIR Right 08/09/2015   Procedure: HERNIA REPAIR INGUINAL ADULT;  Surgeon: [name], MD;  Location: [facility] ORS;  Service: General;  Laterality: Right;  . NASAL SINUS SURGERY    . nuclear stress test    . PORTA CATH INSERTION N/A 12/19/2017   Procedure: PORTA CATH INSERTION;  Surgeon: [name], MD;  Location:  [place] CV LAB;  Service: Cardiovascular;  Laterality: N/A;  . URETERAL BIOPSY Left 12/10/2017   Procedure: URETERAL & renal PELVIS BIOPSY;  Surgeon: [name], MD;  Location: [facility] ORS;  Service: Urology;  Laterality: Left;  . URETEROSCOPY Left 12/10/2017   Procedure: URETEROSCOPY;  Surgeon: [name], MD;  Location: [facility] ORS;   Service: Urology;  Laterality: Left;    Family History  Problem Relation Age of Onset  . Heart disease Mother   . Cancer Father        Lung and colon cancer  . Heart disease Father   . Emphysema Maternal Grandfather   . Tuberculosis Maternal Grandmother     Social History:  reports that he has never smoked. He has quit using smokeless tobacco.  His smokeless tobacco use included chew. He reports that he drinks about 1.0 - 5.0 standard drinks of alcohol per week. He reports that he does not use drugs.  Patient is retired from the [employer]. Patient denies known exposures to radiation on toxins. The patient has a family trip planned to [facility] between 03/30/2018 - 04/06/2018. The patient is accompanied by his wife, Patrick Mcgee, today.  Allergies:  Allergies  Allergen Reactions  . Sulfa Antibiotics Other (See Comments)    Joint pain  . Ace Inhibitors Cough  . Invokana [Canagliflozin] Other (See Comments)    Leg pain  . Penicillins Rash    Has patient had a PCN reaction causing immediate rash, facial/tongue/throat swelling, SOB or lightheadedness with hypotension: Yes Has patient had a PCN reaction causing severe rash involving mucus membranes or skin necrosis: No Has patient had a PCN reaction that required hospitalization: No Has patient had a PCN reaction occurring within the last 10 years: No If all of the above answers are "NO", then may proceed with Cephalosporin use.    Current Medications: Current Outpatient Medications  Medication Sig Dispense Refill  . acetaminophen (TYLENOL) 500 MG tablet Take 500 mg by mouth every 6 (six) hours as needed for moderate pain. When taking oxycodone 5 mg tablets    . ALPRAZolam (XANAX) 0.25 MG tablet Take 1 tablet (0.25 mg total) by mouth 3 (three) times daily as needed for anxiety. 20 tablet 0  . atorvastatin (LIPITOR) 40 MG tablet TAKE ONE TABLET BY MOUTH ONCE DAILY (Patient taking differently: TAKE ONE TABLET BY MOUTH DAILY AT BEDTIME) 90  tablet 3  . carvedilol (COREG) 12.5 MG tablet TAKE ONE TABLET BY MOUTH TWICE DAILY 180 tablet 3  . clobetasol cream (TEMOVATE) 7.98 % Apply 1 application topically as needed (for skin rash). 30 g 1  . doxepin (SINEQUAN) 10 MG capsule Take 1 capsule (10 mg total) by mouth 4 (four) times daily as needed. 120 capsule 5  . feeding supplement, ENSURE ENLIVE, (ENSURE ENLIVE) LIQD Take 237 mLs by mouth 3 (three) times daily between meals. 90 Bottle 0  . fentaNYL (DURAGESIC - DOSED MCG/HR) 25 MCG/HR patch Place 1 patch (25 mcg total) onto the skin every 3 (three) days. 10 patch 0  . fluticasone (FLONASE) 50 MCG/ACT nasal spray Place 2 sprays into both nostrils daily. 16 g 12  . glucose blood (CONTOUR NEXT TEST) test strip Checks sugar 4 times daily. DX E11.9-strips for Contour next EZ meter 360 each 3  . HYDROmorphone (DILAUDID) 2 MG tablet Take 1 tablet (2 mg total) by mouth every 4 (four) hours as needed for severe pain (breakthrough pain). 40 tablet 0  . Ivermectin (SOOLANTRA) 1 % CREA Apply topically  at bedtime. To face    . lactulose (CEPHULAC) 20 g packet Take 1 packet (20 g total) by mouth 2 (two) times daily as needed (constipation). 60 each 0  . lidocaine-prilocaine (EMLA) cream Apply 1 application topically as needed. 30 g 3  . linaclotide (LINZESS) 290 MCG CAPS capsule Take 1 capsule (290 mcg total) by mouth daily before breakfast. 30 capsule 0  . losartan (COZAAR) 100 MG tablet TAKE ONE TABLET BY MOUTH ONCE DAILY (Patient taking differently: TAKE ONE TABLET BY MOUTH DAILY AT BEDTIME) 90 tablet 3  . meclizine (ANTIVERT) 25 MG tablet Take 25 mg by mouth 3 (three) times daily as needed for dizziness.    . metFORMIN (GLUCOPHAGE) 1000 MG tablet Take 1 tablet (1,000 mg total) by mouth 2 (two) times daily with a meal. 180 tablet 3  . nystatin cream (MYCOSTATIN) APPLY  CREAM TOPICALLY TWICE DAILY AROUND  THE  GROIN  AREA 15 g 1  . ondansetron (ZOFRAN) 8 MG tablet Take 1 tablet (8 mg total) by mouth  every 8 (eight) hours as needed for nausea or vomiting (if needed beginning 3 days after chemotherapy). 20 tablet 0  . oxyCODONE (OXYCONTIN) 15 mg 12 hr tablet Take 1 tablet (15 mg total) by mouth every 12 (twelve) hours. 20 tablet 0  . polyethylene glycol (MIRALAX) packet Take 17 g by mouth 2 (two) times daily. 14 each 0  . sodium chloride 1 g tablet Take 1 tablet (1 g total) by mouth 3 (three) times daily with meals. 15 tablet 0  . tamsulosin (FLOMAX) 0.4 MG CAPS capsule Take 1 capsule (0.4 mg total) by mouth daily. 30 capsule 0   No current facility-administered medications for this visit.    Facility-Administered Medications Ordered in Other Visits  Medication Dose Route Frequency Provider Last Rate Last Dose  . heparin lock flush 100 unit/mL  500 Units Intravenous Once Corcoran, Melissa C, MD      . sodium chloride flush (NS) 0.9 % injection 10 mL  10 mL Intravenous PRN Nolon Stalls C, MD   10 mL at 03/04/18 1003    Review of Systems  Constitutional: Positive for malaise/fatigue and weight loss (3 pound). Negative for chills, diaphoresis and fever.       He doesn't feel good today.  No energy.  HENT: Negative.  Negative for congestion, ear discharge, ear pain, nosebleeds, sinus pain and sore throat.   Eyes: Negative.  Negative for blurred vision, double vision, photophobia and pain.  Respiratory: Positive for cough (chronic). Negative for hemoptysis, sputum production and shortness of breath.   Cardiovascular: Negative for chest pain, palpitations, orthopnea, leg swelling and PND.       MI s/p angioplasty resulting in placement of 5-6 stents (last 2015). On 3 antihypertensive medications. (+) OSAH syndrome - uses nocturnal PAP therapy.   Gastrointestinal: Positive for abdominal pain, constipation (chronic; takes MOM; uses Fleets enemas), nausea and vomiting. Negative for blood in stool, diarrhea, heartburn (on PPI) and melena.       Abdominal discomfort.  Genitourinary: Negative.   Negative for dysuria, frequency, hematuria and urgency.  Musculoskeletal: Positive for back pain (controlled). Negative for falls, joint pain and myalgias.  Skin: Positive for itching (chronic). Negative for rash (recurrent intriginous rash).  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 Negative for dizziness, tremors, focal weakness, weakness and headaches.  Endo/Heme/Allergies: Positive for environmental allergies (seasonal allergies). Does not bruise/bleed easily.       Diabetes  Psychiatric/Behavioral: Negative for  depression, memory loss and suicidal ideas. The patient is not nervous/anxious and does not have insomnia.   All other systems reviewed and are negative.  Performance status (ECOG): 2 - Symptomatic, <50% confined to bed  Vital signs BP (!) 160/85 (BP Location: Left Arm, Patient Position: Sitting)   Pulse (!) 58   Temp (!) 94.6 F (34.8 C) (Tympanic)   Resp 18   Wt 169 lb 3 oz (76.7 kg)   BMI 25.72 kg/m   Physical Exam  Constitutional: He is oriented to person, place, and time and well-developed, well-nourished, and in no distress.  HENT:  Head: Normocephalic and atraumatic.  Short gray hair.  Eyes: Pupils are equal, round, and reactive to light. EOM are normal. No scleral icterus.  Brown eyes  Neck: Normal range of motion. Neck supple. No tracheal deviation present. No thyromegaly present.  Cardiovascular: Normal rate, regular rhythm and normal heart sounds. Exam reveals no gallop and no friction rub.  No murmur heard. Pulmonary/Chest: Effort normal and breath sounds normal. No respiratory distress. He has no wheezes. He has no rales.  Abdominal: Soft. Bowel sounds are normal. He exhibits no distension and no mass. There is no tenderness. There is no rebound and no guarding.  Musculoskeletal: Normal range of motion. He exhibits no edema or tenderness.  Lymphadenopathy:    He has no cervical adenopathy.    He has no axillary adenopathy.        Right: No inguinal and no supraclavicular adenopathy present.       Left: No inguinal and no supraclavicular adenopathy present.  Neurological: He is alert and oriented to person, place, and time.  Skin: Skin is warm and dry. No rash noted. No erythema.  Psychiatric: Mood, affect and judgment normal.  Nursing note and vitals reviewed.   Infusion on 03/04/2018  Component Date Value Ref Range Status  . Magnesium 03/04/2018 2.3  1.7 - 2.4 mg/dL Final   Performed at [facility], [address], [place], [place] [address]  . Sodium 03/04/2018 137  135 - 145 mmol/L Final  . Potassium 03/04/2018 4.2  3.5 - 5.1 mmol/L Final  . Chloride 03/04/2018 104  98 - 111 mmol/L Final  . CO2 03/04/2018 26  22 - 32 mmol/L Final  . Glucose, Bld 03/04/2018 170* 70 - 99 mg/dL Final  . BUN 03/04/2018 13  8 - 23 mg/dL Final  . Creatinine, Ser 03/04/2018 0.64  0.61 - 1.24 mg/dL Final  . Calcium 03/04/2018 9.1  8.9 - 10.3 mg/dL Final  . Total Protein 03/04/2018 6.6  6.5 - 8.1 g/dL Final  . Albumin 03/04/2018 3.7  3.5 - 5.0 g/dL Final  . AST 03/04/2018 27  15 - 41 U/L Final  . ALT 03/04/2018 25  0 - 44 U/L Final  . Alkaline Phosphatase 03/04/2018 69  38 - 126 U/L Final  . Total Bilirubin 03/04/2018 0.7  0.3 - 1.2 mg/dL Final  . GFR calc non Af Amer 03/04/2018 >60  >60 mL/min Final  . GFR calc Af Amer 03/04/2018 >60  >60 mL/min Final   Comment: (NOTE) The eGFR has been calculated using the CKD EPI equation. This calculation has not been validated in all clinical situations. eGFR's persistently <60 mL/min signify possible Chronic Kidney Disease.   Georgiann Hahn gap 03/04/2018 7  5 - 15 Final   Performed at [facility], [address] [place], [place] [address]  . WBC 03/04/2018 2.6* 3.8 - 10.6 K/uL Final  . RBC  03/04/2018 2.83* 4.40 - 5.90 MIL/uL Final  . Hemoglobin 03/04/2018 9.2* 13.0 - 18.0 g/dL Final  . HCT 03/04/2018 26.8* 40.0 - 52.0 % Final  . MCV 03/04/2018 94.7  80.0 - 100.0 fL  Final  . MCH 03/04/2018 32.5  26.0 - 34.0 pg Final  . MCHC 03/04/2018 34.4  32.0 - 36.0 g/dL Final  . RDW 03/04/2018 19.2* 11.5 - 14.5 % Final  . Platelets 03/04/2018 207  150 - 440 K/uL Final  . Neutrophils Relative % 03/04/2018 58  % Final  . Neutro Abs 03/04/2018 1.5  1.4 - 6.5 K/uL Final  . Lymphocytes Relative 03/04/2018 31  % Final  . Lymphs Abs 03/04/2018 0.8* 1.0 - 3.6 K/uL Final  . Monocytes Relative 03/04/2018 9  % Final  . Monocytes Absolute 03/04/2018 0.2  0.2 - 1.0 K/uL Final  . Eosinophils Relative 03/04/2018 1  % Final  . Eosinophils Absolute 03/04/2018 0.0  0 - 0.7 K/uL Final  . Basophils Relative 03/04/2018 1  % Final  . Basophils Absolute 03/04/2018 0.0  0 - 0.1 K/uL Final   Performed at [facility], [address], [place], [place] [address]    Assessment:  DAVINCI GLOTFELTY is a 72 y.o. 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  He presented with abdominal pain and weight loss.  Abdomen and pelvic CT on 12/01/2017 revealed a suspicious enhancing, filling defect involving the lower pole collecting system of the left kidney with asymmetric hypoenhancement of the inferior pole cortex of left kidney noted. Findings were worrisome for urothelial neoplasm.  There was abdominal and pelvic adenopathy compatible with nodal metastasis.  There was a 2.6 cm right para celiac node, 1,6 cm left retroperitoneal node, 1.4 cm right retroperitoneal node, 2.1 cm right common iliac node and 1 cm left external iliac node.  There was aortic atherosclerosis, kidney cysts, and prostate gland enlargement.  Chest CT on 12/07/2017 revealed no evidence of metastatic disease.    He underwent left ureteroscopy with renal pelvis biopsy and left ureteral stent placement on 12/10/2017.  Findings included a large nodular, vascular tumor within the left lower pole collecting system completely obstructing the entire left lower pole moiety.  Ureter, right  kidney, and bladder was normal.  Pathology revealed small fragments of high-grade urothelial carcinoma with a small focus of invasion.  He is s/p day 1 of cycle #4 carboplatin + gemcitabine (12/21/2017 - 02/25/2018). He is tolerating treatment well.   Chest, abdomen, and pelvic CT on 02/22/2018 revealed marked improvement in nodal metastatic disease in the abdomen/pelvis. There continued to be enhancing material similar to prior in the lower pole collecting system on the left, with hypoenhancement of the subtended parenchyma in the lower third of the left kidney suggesting poor urinary drainage from underlying tumor.  There was no new metastatic lesions are identified.   He has B12 deficiency.  B12 was 247 on 12/31/2017.  He began oral B12 on 01/07/2018.  B12 was 983 on 02/11/2018.  He has diabetes and coronary artery disease s/p MI in 1995.  He is s/p stent placement x 5-6.  Colonoscopy in 2018 was negative.  He has chemotherapy induced anemia requiring Procrit injections every 2 weeks to support his counts (last 02/25/2018).   Symptomatically, patient is feeling bad today. He is having nausea/vomiting, abdominal aching, and fatigue. He denies fevers. He has constipation that is controlled with OTC interventions. Pain is well controlled with APAP only. Exam is  stable. WBC 2600 (ANC 1500). Hemoglobin is 9.2.  Platelet count is 207,000. Renal and hepatic function normal.   Plan: 1. Labs today:  CBC with diff, CMP, Mg. 2. Metastatic urothelial carcinoma - treatment ongoing  Continues to tolerate treatments well. Denies significant side effects.   Labs reviewed. Blood counts low (ANC 1500). Will HOLD day 8 of cycle #4 gemcitabine. Discuss symptom management.  Patient has antiemetics and pain medications at home to use on a PRN basis. Patient  advising that the  prescribed interventions are adequate at this point. Continue all medications as previously prescribed.   Patient continues to be  followed by outpatient out of medicine team.  No immediate needs identified at this time.  We will continue to coordinate care with palliative medicine. 3. Chemotherapy induced neutropenia  - stable  Blood counts remain stable. ANC is 1500 today.   Patient has not required GCSF injections with this cycle thus far.   Review neutropenic precautions, with emphasis placed on the avoidance of large crowds and close contact with sick individuals. Stressed the importance of strict handwashing in efforts to prevent infection.  4. Chemotherapy induced anemia  - stable  Labs reviewed. Hemoglobin stable at 9.2 following Procrit injection on 02/25/2018.   Continue Procrit injections as previously ordered. Next injection is due on 09/02, however due to the holiday, patient will receive injection on 09/03. 5. Constipation - chronic  Has chronic issues with constipation. He is on a bowel regimen. Patient notes that he "has to" have a bowel movement everyday.   Discussed increased dietary fiber and fluid intake.   Discussed need to increase fluid intake and activity level.   Continue OTC interventions at this time.  6. Cough - chronic  Discuss consult with pulmonary medicine Ashby Dawes, MD). Additional testing being done for interstitial lung disease. Follow up scheduled in 3 months.  7. HYPOmagnesemia - stable  Magnesium level stable at 2.3. He is using MOM on a daily basis.  Discuss decreasing intake.  Decrease oral Mag-ox 400 mg every other day.  8. Nutrition - stable 9. B12 deficiency  - improved   B12 level normal at 983 on 02/11/2018.   Continue oral B12 1,000 mcg daily as previously prescribed.  10. RTC on 03/12/2018 for labs (CBC with diff) and +/- Procrit injection. 11. RTC on 03/18/2018 for MD assessment, labs (CBC with diff, CMP, Mg), and day 1 of cycle #5 carboplatin + gemcitabine. 12. RTC on 03/25/2018 for MD assessment, labs (CBC with diff, CMP, Mg), and day 8 of cycle #5  gemcitabine.   Honor Loh, NP  03/04/18, 11:02 AM   I saw and evaluated the patient, participating in the key portions of the service and reviewing pertinent diagnostic studies and records.  I reviewed the nurse practitioner's note and agree with the findings and the plan.  The assessment and plan were discussed with the patient.  Multiple questions were asked by the patient and answered.   Nolon Stalls, MD 03/04/2018,11:02 AM

## 2018-03-04 ENCOUNTER — Inpatient Hospital Stay: Payer: Medicare Other

## 2018-03-04 ENCOUNTER — Telehealth: Payer: Self-pay | Admitting: Internal Medicine

## 2018-03-04 ENCOUNTER — Encounter: Payer: Self-pay | Admitting: Hematology and Oncology

## 2018-03-04 ENCOUNTER — Inpatient Hospital Stay (HOSPITAL_BASED_OUTPATIENT_CLINIC_OR_DEPARTMENT_OTHER): Payer: Medicare Other | Admitting: Hematology and Oncology

## 2018-03-04 ENCOUNTER — Other Ambulatory Visit: Payer: Self-pay | Admitting: *Deleted

## 2018-03-04 VITALS — BP 160/85 | HR 58 | Temp 94.6°F | Resp 18 | Wt 169.2 lb

## 2018-03-04 DIAGNOSIS — R5383 Other fatigue: Secondary | ICD-10-CM

## 2018-03-04 DIAGNOSIS — R05 Cough: Secondary | ICD-10-CM | POA: Diagnosis not present

## 2018-03-04 DIAGNOSIS — I2510 Atherosclerotic heart disease of native coronary artery without angina pectoris: Secondary | ICD-10-CM | POA: Diagnosis not present

## 2018-03-04 DIAGNOSIS — D701 Agranulocytosis secondary to cancer chemotherapy: Secondary | ICD-10-CM

## 2018-03-04 DIAGNOSIS — C642 Malignant neoplasm of left kidney, except renal pelvis: Secondary | ICD-10-CM

## 2018-03-04 DIAGNOSIS — C778 Secondary and unspecified malignant neoplasm of lymph nodes of multiple regions: Secondary | ICD-10-CM

## 2018-03-04 DIAGNOSIS — R112 Nausea with vomiting, unspecified: Secondary | ICD-10-CM

## 2018-03-04 DIAGNOSIS — D6481 Anemia due to antineoplastic chemotherapy: Secondary | ICD-10-CM | POA: Diagnosis not present

## 2018-03-04 DIAGNOSIS — T451X5A Adverse effect of antineoplastic and immunosuppressive drugs, initial encounter: Secondary | ICD-10-CM

## 2018-03-04 DIAGNOSIS — K5909 Other constipation: Secondary | ICD-10-CM

## 2018-03-04 DIAGNOSIS — E538 Deficiency of other specified B group vitamins: Secondary | ICD-10-CM | POA: Diagnosis not present

## 2018-03-04 DIAGNOSIS — R109 Unspecified abdominal pain: Secondary | ICD-10-CM

## 2018-03-04 DIAGNOSIS — Z5111 Encounter for antineoplastic chemotherapy: Secondary | ICD-10-CM | POA: Diagnosis not present

## 2018-03-04 LAB — COMPREHENSIVE METABOLIC PANEL
ALT: 25 U/L (ref 0–44)
AST: 27 U/L (ref 15–41)
Albumin: 3.7 g/dL (ref 3.5–5.0)
Alkaline Phosphatase: 69 U/L (ref 38–126)
Anion gap: 7 (ref 5–15)
BUN: 13 mg/dL (ref 8–23)
CO2: 26 mmol/L (ref 22–32)
Calcium: 9.1 mg/dL (ref 8.9–10.3)
Chloride: 104 mmol/L (ref 98–111)
Creatinine, Ser: 0.64 mg/dL (ref 0.61–1.24)
GFR calc Af Amer: >60 mL/min (ref >60)
GFR calc non Af Amer: >60 mL/min (ref >60)
Glucose, Bld: 170 mg/dL — ABNORMAL HIGH (ref 70–99)
Potassium: 4.2 mmol/L (ref 3.5–5.1)
Sodium: 137 mmol/L (ref 135–145)
Total Bilirubin: 0.7 mg/dL (ref 0.3–1.2)
Total Protein: 6.6 g/dL (ref 6.5–8.1)

## 2018-03-04 LAB — CBC WITH DIFFERENTIAL/PLATELET
Basophils Absolute: 0 10*3/uL (ref 0–0.1)
Basophils Relative: 1 %
Eosinophils Absolute: 0 10*3/uL (ref 0–0.7)
Eosinophils Relative: 1 %
HCT: 26.8 % — ABNORMAL LOW (ref 40.0–52.0)
Hemoglobin: 9.2 g/dL — ABNORMAL LOW (ref 13.0–18.0)
Lymphocytes Relative: 31 %
Lymphs Abs: 0.8 10*3/uL — ABNORMAL LOW (ref 1.0–3.6)
MCH: 32.5 pg (ref 26.0–34.0)
MCHC: 34.4 g/dL (ref 32.0–36.0)
MCV: 94.7 fL (ref 80.0–100.0)
Monocytes Absolute: 0.2 10*3/uL (ref 0.2–1.0)
Monocytes Relative: 9 %
Neutro Abs: 1.5 10*3/uL (ref 1.4–6.5)
Neutrophils Relative %: 58 %
Platelets: 207 10*3/uL (ref 150–440)
RBC: 2.83 MIL/uL — ABNORMAL LOW (ref 4.40–5.90)
RDW: 19.2 % — ABNORMAL HIGH (ref 11.5–14.5)
WBC: 2.6 10*3/uL — ABNORMAL LOW (ref 3.8–10.6)

## 2018-03-04 LAB — MAGNESIUM: Magnesium: 2.3 mg/dL (ref 1.7–2.4)

## 2018-03-04 MED ORDER — HEPARIN SOD (PORK) LOCK FLUSH 100 UNIT/ML IV SOLN
500.0000 [IU] | Freq: Once | INTRAVENOUS | Status: AC
  Administered 2018-03-04: 500 [IU] via INTRAVENOUS
  Filled 2018-03-04: qty 5

## 2018-03-04 MED ORDER — SODIUM CHLORIDE 0.9% FLUSH
10.0000 mL | INTRAVENOUS | Status: DC | PRN
  Administered 2018-03-04: 10 mL via INTRAVENOUS
  Filled 2018-03-04: qty 10

## 2018-03-04 NOTE — Telephone Encounter (Signed)
Spoke with Hassan Rowan at [facility] center. She will talk to [facility] to see if they can draw labs. If not, she will call back.

## 2018-03-04 NOTE — Telephone Encounter (Signed)
Patient thinks he is supposed to have labs done today.  Please call patient cell of cancer center as he is there now and they can do labs.  [phone]

## 2018-03-04 NOTE — Progress Notes (Signed)
Patient c/o n/v since Saturday.  States he is having stomach pain 5/10.  Patient states he saw Pulmonologist Thursday, who was wanting additional labs.  Our lab did not draw this morning.

## 2018-03-06 ENCOUNTER — Ambulatory Visit
Admission: RE | Admit: 2018-03-06 | Discharge: 2018-03-06 | Disposition: A | Discharge status: Home / Self Care | Payer: Medicare Other | Source: Ambulatory Visit | Attending: Internal Medicine | Admitting: Internal Medicine

## 2018-03-06 DIAGNOSIS — I252 Old myocardial infarction: Secondary | ICD-10-CM | POA: Insufficient documentation

## 2018-03-06 DIAGNOSIS — J811 Chronic pulmonary edema: Secondary | ICD-10-CM | POA: Diagnosis not present

## 2018-03-06 DIAGNOSIS — I119 Hypertensive heart disease without heart failure: Secondary | ICD-10-CM | POA: Diagnosis not present

## 2018-03-06 DIAGNOSIS — E119 Type 2 diabetes mellitus without complications: Secondary | ICD-10-CM | POA: Diagnosis not present

## 2018-03-06 DIAGNOSIS — E785 Hyperlipidemia, unspecified: Secondary | ICD-10-CM | POA: Insufficient documentation

## 2018-03-06 DIAGNOSIS — R0609 Other forms of dyspnea: Secondary | ICD-10-CM | POA: Diagnosis not present

## 2018-03-06 DIAGNOSIS — I77810 Thoracic aortic ectasia: Secondary | ICD-10-CM | POA: Insufficient documentation

## 2018-03-06 NOTE — Progress Notes (Signed)
*  PRELIMINARY RESULTS* Echocardiogram 2D Echocardiogram has been performed.  Patrick Mcgee 03/06/2018, 11:51 AM

## 2018-03-07 ENCOUNTER — Other Ambulatory Visit: Payer: Medicare Other

## 2018-03-07 ENCOUNTER — Ambulatory Visit: Payer: Medicare Other

## 2018-03-12 ENCOUNTER — Inpatient Hospital Stay: Payer: Medicare Other | Attending: Hematology and Oncology

## 2018-03-12 ENCOUNTER — Inpatient Hospital Stay: Payer: Medicare Other

## 2018-03-12 DIAGNOSIS — C642 Malignant neoplasm of left kidney, except renal pelvis: Secondary | ICD-10-CM | POA: Insufficient documentation

## 2018-03-12 DIAGNOSIS — J849 Interstitial pulmonary disease, unspecified: Secondary | ICD-10-CM | POA: Insufficient documentation

## 2018-03-12 DIAGNOSIS — E538 Deficiency of other specified B group vitamins: Secondary | ICD-10-CM | POA: Insufficient documentation

## 2018-03-12 DIAGNOSIS — K5900 Constipation, unspecified: Secondary | ICD-10-CM | POA: Diagnosis not present

## 2018-03-12 DIAGNOSIS — Z7984 Long term (current) use of oral hypoglycemic drugs: Secondary | ICD-10-CM | POA: Insufficient documentation

## 2018-03-12 DIAGNOSIS — E1140 Type 2 diabetes mellitus with diabetic neuropathy, unspecified: Secondary | ICD-10-CM | POA: Insufficient documentation

## 2018-03-12 DIAGNOSIS — C778 Secondary and unspecified malignant neoplasm of lymph nodes of multiple regions: Secondary | ICD-10-CM | POA: Diagnosis not present

## 2018-03-12 DIAGNOSIS — D6481 Anemia due to antineoplastic chemotherapy: Secondary | ICD-10-CM | POA: Insufficient documentation

## 2018-03-12 DIAGNOSIS — Z5111 Encounter for antineoplastic chemotherapy: Secondary | ICD-10-CM | POA: Insufficient documentation

## 2018-03-12 DIAGNOSIS — I2510 Atherosclerotic heart disease of native coronary artery without angina pectoris: Secondary | ICD-10-CM | POA: Diagnosis not present

## 2018-03-12 DIAGNOSIS — R05 Cough: Secondary | ICD-10-CM | POA: Diagnosis not present

## 2018-03-12 LAB — CBC WITH DIFFERENTIAL/PLATELET
Basophils Absolute: 0 10*3/uL (ref 0–0.1)
Basophils Relative: 1 %
Eosinophils Absolute: 0.1 10*3/uL (ref 0–0.7)
Eosinophils Relative: 3 %
HCT: 30.9 % — ABNORMAL LOW (ref 40.0–52.0)
Hemoglobin: 10.3 g/dL — ABNORMAL LOW (ref 13.0–18.0)
Lymphocytes Relative: 24 %
Lymphs Abs: 1 10*3/uL (ref 1.0–3.6)
MCH: 32 pg (ref 26.0–34.0)
MCHC: 33.3 g/dL (ref 32.0–36.0)
MCV: 96 fL (ref 80.0–100.0)
Monocytes Absolute: 0.8 10*3/uL (ref 0.2–1.0)
Monocytes Relative: 20 %
Neutro Abs: 2.1 10*3/uL (ref 1.4–6.5)
Neutrophils Relative %: 52 %
Platelets: 194 10*3/uL (ref 150–440)
RBC: 3.22 MIL/uL — ABNORMAL LOW (ref 4.40–5.90)
RDW: 20.5 % — ABNORMAL HIGH (ref 11.5–14.5)
WBC: 4 10*3/uL (ref 3.8–10.6)

## 2018-03-18 ENCOUNTER — Inpatient Hospital Stay: Payer: Medicare Other

## 2018-03-18 ENCOUNTER — Other Ambulatory Visit: Payer: Self-pay | Admitting: Hematology and Oncology

## 2018-03-18 ENCOUNTER — Encounter: Payer: Self-pay | Admitting: Hematology and Oncology

## 2018-03-18 ENCOUNTER — Inpatient Hospital Stay (HOSPITAL_BASED_OUTPATIENT_CLINIC_OR_DEPARTMENT_OTHER): Payer: Medicare Other | Admitting: Hematology and Oncology

## 2018-03-18 VITALS — BP 159/93 | HR 61 | Temp 95.9°F | Resp 18 | Wt 177.4 lb

## 2018-03-18 DIAGNOSIS — R05 Cough: Secondary | ICD-10-CM

## 2018-03-18 DIAGNOSIS — D6481 Anemia due to antineoplastic chemotherapy: Secondary | ICD-10-CM | POA: Diagnosis not present

## 2018-03-18 DIAGNOSIS — K5900 Constipation, unspecified: Secondary | ICD-10-CM

## 2018-03-18 DIAGNOSIS — C778 Secondary and unspecified malignant neoplasm of lymph nodes of multiple regions: Secondary | ICD-10-CM | POA: Diagnosis not present

## 2018-03-18 DIAGNOSIS — J849 Interstitial pulmonary disease, unspecified: Secondary | ICD-10-CM | POA: Diagnosis not present

## 2018-03-18 DIAGNOSIS — C689 Malignant neoplasm of urinary organ, unspecified: Secondary | ICD-10-CM

## 2018-03-18 DIAGNOSIS — E1140 Type 2 diabetes mellitus with diabetic neuropathy, unspecified: Secondary | ICD-10-CM

## 2018-03-18 DIAGNOSIS — Z5111 Encounter for antineoplastic chemotherapy: Secondary | ICD-10-CM

## 2018-03-18 DIAGNOSIS — T451X5A Adverse effect of antineoplastic and immunosuppressive drugs, initial encounter: Secondary | ICD-10-CM

## 2018-03-18 DIAGNOSIS — C642 Malignant neoplasm of left kidney, except renal pelvis: Secondary | ICD-10-CM | POA: Diagnosis not present

## 2018-03-18 DIAGNOSIS — I2510 Atherosclerotic heart disease of native coronary artery without angina pectoris: Secondary | ICD-10-CM

## 2018-03-18 DIAGNOSIS — E538 Deficiency of other specified B group vitamins: Secondary | ICD-10-CM | POA: Diagnosis not present

## 2018-03-18 DIAGNOSIS — D701 Agranulocytosis secondary to cancer chemotherapy: Secondary | ICD-10-CM

## 2018-03-18 LAB — COMPREHENSIVE METABOLIC PANEL
ALT: 16 U/L (ref 0–44)
AST: 22 U/L (ref 15–41)
Albumin: 3.6 g/dL (ref 3.5–5.0)
Alkaline Phosphatase: 69 U/L (ref 38–126)
Anion gap: 5 (ref 5–15)
BUN: 8 mg/dL (ref 8–23)
CO2: 28 mmol/L (ref 22–32)
Calcium: 9 mg/dL (ref 8.9–10.3)
Chloride: 107 mmol/L (ref 98–111)
Creatinine, Ser: 0.64 mg/dL (ref 0.61–1.24)
GFR calc Af Amer: >60 mL/min (ref >60)
GFR calc non Af Amer: >60 mL/min (ref >60)
Glucose, Bld: 184 mg/dL — ABNORMAL HIGH (ref 70–99)
Potassium: 3.9 mmol/L (ref 3.5–5.1)
Sodium: 140 mmol/L (ref 135–145)
Total Bilirubin: 0.9 mg/dL (ref 0.3–1.2)
Total Protein: 6.4 g/dL — ABNORMAL LOW (ref 6.5–8.1)

## 2018-03-18 LAB — CBC WITH DIFFERENTIAL/PLATELET
Basophils Absolute: 0.1 10*3/uL (ref 0–0.1)
Basophils Relative: 1 %
Eosinophils Absolute: 0.1 10*3/uL (ref 0–0.7)
Eosinophils Relative: 3 %
HCT: 28.9 % — ABNORMAL LOW (ref 40.0–52.0)
Hemoglobin: 9.8 g/dL — ABNORMAL LOW (ref 13.0–18.0)
Lymphocytes Relative: 23 %
Lymphs Abs: 1 10*3/uL (ref 1.0–3.6)
MCH: 32.5 pg (ref 26.0–34.0)
MCHC: 34.1 g/dL (ref 32.0–36.0)
MCV: 95.1 fL (ref 80.0–100.0)
Monocytes Absolute: 0.7 10*3/uL (ref 0.2–1.0)
Monocytes Relative: 17 %
Neutro Abs: 2.4 10*3/uL (ref 1.4–6.5)
Neutrophils Relative %: 56 %
Platelets: 224 10*3/uL (ref 150–440)
RBC: 3.03 MIL/uL — ABNORMAL LOW (ref 4.40–5.90)
RDW: 20 % — ABNORMAL HIGH (ref 11.5–14.5)
WBC: 4.3 10*3/uL (ref 3.8–10.6)

## 2018-03-18 LAB — MAGNESIUM: Magnesium: 2 mg/dL (ref 1.7–2.4)

## 2018-03-18 MED ORDER — SODIUM CHLORIDE 0.9 % IV SOLN
370.0000 mg | Freq: Once | INTRAVENOUS | Status: AC
  Administered 2018-03-18: 370 mg via INTRAVENOUS
  Filled 2018-03-18: qty 37

## 2018-03-18 MED ORDER — PALONOSETRON HCL INJECTION 0.25 MG/5ML
0.2500 mg | Freq: Once | INTRAVENOUS | Status: AC
  Administered 2018-03-18: 0.25 mg via INTRAVENOUS
  Filled 2018-03-18: qty 5

## 2018-03-18 MED ORDER — HEPARIN SOD (PORK) LOCK FLUSH 100 UNIT/ML IV SOLN
500.0000 [IU] | Freq: Once | INTRAVENOUS | Status: AC | PRN
  Administered 2018-03-18: 500 [IU]
  Filled 2018-03-18: qty 5

## 2018-03-18 MED ORDER — SODIUM CHLORIDE 0.9% FLUSH
10.0000 mL | INTRAVENOUS | Status: DC | PRN
  Filled 2018-03-18: qty 10

## 2018-03-18 MED ORDER — SODIUM CHLORIDE 0.9 % IV SOLN
1600.0000 mg | Freq: Once | INTRAVENOUS | Status: AC
  Administered 2018-03-18: 1600 mg via INTRAVENOUS
  Filled 2018-03-18: qty 26.3

## 2018-03-18 MED ORDER — EPOETIN ALFA 10000 UNIT/ML IJ SOLN
10000.0000 [IU] | Freq: Once | INTRAMUSCULAR | Status: AC
  Administered 2018-03-18: 10000 [IU] via SUBCUTANEOUS
  Filled 2018-03-18: qty 2

## 2018-03-18 MED ORDER — DEXAMETHASONE SODIUM PHOSPHATE 10 MG/ML IJ SOLN
10.0000 mg | Freq: Once | INTRAMUSCULAR | Status: AC
  Administered 2018-03-18: 10 mg via INTRAVENOUS
  Filled 2018-03-18: qty 1

## 2018-03-18 MED ORDER — SODIUM CHLORIDE 0.9 % IV SOLN
Freq: Once | INTRAVENOUS | Status: AC
  Administered 2018-03-18: 10:00:00 via INTRAVENOUS
  Filled 2018-03-18: qty 250

## 2018-03-18 NOTE — Progress Notes (Signed)
Patient offers no complaints today. 

## 2018-03-18 NOTE — Progress Notes (Signed)
[facility] day:  03/18/2018   Chief Complaint: Patrick Mcgee is a 72 y.o. male with metastatic urothelial carcinoma who is seen for review of interval restaging studies and assessment prior to day 1 of cycle #5 gemcitabine.  HPI:  Patrick Mcgee patient was last seen in the medical oncology clinic on 03/04/2018.  At that time, he was feeling bad. He had nausea/vomiting, abdominal aching, and fatigue. He denied fevers. He had constipation that was controlled with OTC interventions. Pain was well controlled with APAP only. Exam was stable. WBC was 2600 ([place] 1500). Hemoglobin was 9.2.  Platelet count was 207,000. Renal and hepatic function were normal.   Chemotherapy was held.  During the interim, he has felt "alright".  Energy level is good.  He has helped with house cleaning.  He denies any chest pain, nausea, vomiting or back pain.  He denies any pruritus.  Neuropathy is stable.  He is taking oral B12.  He is eating well.  Weight is up 8 pounds.  He denies any concerns.   Patient is going to [facility] from 03/30/2018 - 04/06/2018.   Past Medical History:  Diagnosis Date  . Acid reflux   . Anxiety   . Depression   . Diabetes mellitus without complication ([place])   . Hyperlipidemia   . Hypertension   . Myocardial infarction ([place]) 1995  . Sleep apnea   . Urothelial cancer ([place]) 11/2017   Left Urothelial mass, chemo tx's    Past Surgical History:  Procedure Laterality Date  . CATARACT EXTRACTION Left   . COLONOSCOPY  2010   [facility]  . COLONOSCOPY WITH PROPOFOL N/A 10/04/2016   Procedure: COLONOSCOPY WITH PROPOFOL;  Surgeon: [name], MD;  Location: [facility] ENDOSCOPY;  Service: Endoscopy;  Laterality: N/A;  . [place], 2000, 2001, 2014  . CYSTOSCOPY W/ RETROGRADES Bilateral 12/10/2017   Procedure: CYSTOSCOPY WITH RETROGRADE PYELOGRAM;  Surgeon: [name], MD;  Location: [facility] ORS;  Service: Urology;   Laterality: Bilateral;  . CYSTOSCOPY WITH STENT PLACEMENT Left 12/10/2017   Procedure: CYSTOSCOPY WITH STENT PLACEMENT;  Surgeon: [name], MD;  Location: [facility] ORS;  Service: Urology;  Laterality: Left;  . INGUINAL HERNIA REPAIR Right 08/09/2015   Procedure: HERNIA REPAIR INGUINAL ADULT;  Surgeon: [name], MD;  Location: [facility] ORS;  Service: General;  Laterality: Right;  . NASAL SINUS SURGERY    . nuclear stress test    . PORTA CATH INSERTION N/A 12/19/2017   Procedure: PORTA CATH INSERTION;  Surgeon: [name], MD;  Location: [facility];  Service: Cardiovascular;  Laterality: N/A;  . URETERAL BIOPSY Left 12/10/2017   Procedure: URETERAL & renal PELVIS BIOPSY;  Surgeon: [name], MD;  Location: [facility] ORS;  Service: Urology;  Laterality: Left;  . URETEROSCOPY Left 12/10/2017   Procedure: URETEROSCOPY;  Surgeon: [name], MD;  Location: [facility] ORS;  Service: Urology;  Laterality: Left;    Family History  Problem Relation Age of Onset  . Heart disease Mother   . Cancer Father        Lung and colon cancer  . Heart disease Father   . Emphysema Maternal Grandfather   . Tuberculosis Maternal Grandmother     Social History:  reports that he has never smoked. He has quit using smokeless tobacco.  His smokeless tobacco use included chew. He reports that he drinks about 1.0 - 5.0 standard drinks of alcohol  per week. He reports that he does not use drugs.  Patient is retired from the [employer]. Patient denies known exposures to radiation on toxins. The patient has a family trip planned to [facility] between 03/30/2018 - 04/06/2018. The patient is accompanied by his wife, [name], today.  Allergies:  Allergies  Allergen Reactions  . Sulfa Antibiotics Other (See Comments)    Joint pain  . Ace Inhibitors Cough  . Invokana [Canagliflozin] Other (See Comments)    Leg pain  . Penicillins Rash    Has patient had a PCN reaction causing immediate rash,  facial/tongue/throat swelling, SOB or lightheadedness with hypotension: Yes Has patient had a PCN reaction causing severe rash involving mucus membranes or skin necrosis: No Has patient had a PCN reaction that required hospitalization: No Has patient had a PCN reaction occurring within the last 10 years: No If all of the above answers are "NO", then may proceed with Cephalosporin use.    Current Medications: Current Outpatient Medications  Medication Sig Dispense Refill  . acetaminophen (TYLENOL) 500 MG tablet Take 500 mg by mouth every 6 (six) hours as needed for moderate pain. When taking oxycodone 5 mg tablets    . ALPRAZolam (XANAX) 0.25 MG tablet Take 1 tablet (0.25 mg total) by mouth 3 (three) times daily as needed for anxiety. 20 tablet 0  . atorvastatin (LIPITOR) 40 MG tablet TAKE ONE TABLET BY MOUTH ONCE DAILY (Patient taking differently: TAKE ONE TABLET BY MOUTH DAILY AT BEDTIME) 90 tablet 3  . carvedilol (COREG) 12.5 MG tablet TAKE ONE TABLET BY MOUTH TWICE DAILY 180 tablet 3  . clobetasol cream (TEMOVATE) 0.53 % Apply 1 application topically as needed (for skin rash). 30 g 1  . doxepin (SINEQUAN) 10 MG capsule Take 1 capsule (10 mg total) by mouth 4 (four) times daily as needed. 120 capsule 5  . feeding supplement, ENSURE ENLIVE, (ENSURE ENLIVE) LIQD Take 237 mLs by mouth 3 (three) times daily between meals. 90 Bottle 0  . fentaNYL (DURAGESIC - DOSED MCG/HR) 25 MCG/HR patch Place 1 patch (25 mcg total) onto the skin every 3 (three) days. 10 patch 0  . fluticasone (FLONASE) 50 MCG/ACT nasal spray Place 2 sprays into both nostrils daily. 16 g 12  . glucose blood (CONTOUR NEXT TEST) test strip Checks sugar 4 times daily. DX E11.9-strips for Contour next EZ meter 360 each 3  . HYDROmorphone (DILAUDID) 2 MG tablet Take 1 tablet (2 mg total) by mouth every 4 (four) hours as needed for severe pain (breakthrough pain). 40 tablet 0  . Ivermectin (SOOLANTRA) 1 % CREA Apply topically at  bedtime. To face    . lactulose (CEPHULAC) 20 g packet Take 1 packet (20 g total) by mouth 2 (two) times daily as needed (constipation). 60 each 0  . lidocaine-prilocaine (EMLA) cream Apply 1 application topically as needed. 30 g 3  . linaclotide (LINZESS) 290 MCG CAPS capsule Take 1 capsule (290 mcg total) by mouth daily before breakfast. 30 capsule 0  . losartan (COZAAR) 100 MG tablet TAKE ONE TABLET BY MOUTH ONCE DAILY (Patient taking differently: TAKE ONE TABLET BY MOUTH DAILY AT BEDTIME) 90 tablet 3  . meclizine (ANTIVERT) 25 MG tablet Take 25 mg by mouth 3 (three) times daily as needed for dizziness.    . metFORMIN (GLUCOPHAGE) 1000 MG tablet Take 1 tablet (1,000 mg total) by mouth 2 (two) times daily with a meal. 180 tablet 3  . nystatin cream (MYCOSTATIN) APPLY  CREAM TOPICALLY  TWICE DAILY AROUND  THE  GROIN  AREA 15 g 1  . ondansetron (ZOFRAN) 8 MG tablet Take 1 tablet (8 mg total) by mouth every 8 (eight) hours as needed for nausea or vomiting (if needed beginning 3 days after chemotherapy). 20 tablet 0  . oxyCODONE (OXYCONTIN) 15 mg 12 hr tablet Take 1 tablet (15 mg total) by mouth every 12 (twelve) hours. 20 tablet 0  . polyethylene glycol (MIRALAX) packet Take 17 g by mouth 2 (two) times daily. 14 each 0  . sodium chloride 1 g tablet Take 1 tablet (1 g total) by mouth 3 (three) times daily with meals. 15 tablet 0  . tamsulosin (FLOMAX) 0.4 MG CAPS capsule Take 1 capsule (0.4 mg total) by mouth daily. 30 capsule 0   No current facility-administered medications for this visit.     Review of Systems  Constitutional: Negative for diaphoresis, fever, malaise/fatigue and weight loss (up 8 pounds).       Feels "alright".  Energy level is good.  HENT: Negative.  Negative for congestion, ear discharge, ear pain, hearing loss, nosebleeds, sinus pain and sore throat.   Eyes: Negative.  Negative for blurred vision, double vision, photophobia and pain.  Respiratory: Negative.  Negative for  hemoptysis, sputum production and shortness of breath.   Cardiovascular: Negative for chest pain, palpitations, orthopnea, leg swelling and PND.       MI s/p angioplasty with 5-6 stents (last 2015). On 3 antihypertensive medications. (+) OSAH syndrome - uses nocturnal PAP therapy.   Gastrointestinal: Positive for constipation (chronic). Negative for abdominal pain, blood in stool, diarrhea, heartburn (on PPI), melena, nausea and vomiting.  Genitourinary: Negative.  Negative for dysuria, frequency, hematuria and urgency.  Musculoskeletal: Negative for back pain, falls, joint pain, myalgias and neck pain.  Skin: Negative.  Negative for itching and rash (recurrent intriginous rash).  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 Negative for weakness and headaches.  Endo/Heme/Allergies: Positive for environmental allergies (seasonal allergies).       Diabetes  Psychiatric/Behavioral: Negative for depression. The patient is not nervous/anxious and does not have insomnia.   All other systems reviewed and are negative.  Performance status (ECOG): 2  Vital signs BP (!) 159/93 (BP Location: Left Arm, Patient Position: Sitting)   Pulse 61   Temp (!) 95.9 F (35.5 C) (Tympanic)   Resp 18   Wt 177 lb 6 oz (80.5 kg)   BMI 26.97 kg/m   Physical Exam  Constitutional: He is oriented to person, place, and time and well-developed, well-nourished, and in no distress. No distress.  HENT:  Head: Normocephalic and atraumatic.  Mouth/Throat: Oropharynx is clear and moist. No oropharyngeal exudate.  Short gray hair.  Eyes: Pupils are equal, round, and reactive to light. EOM are normal. No scleral icterus.  Brown eyes  Neck: Normal range of motion. Neck supple. No JVD present.  Cardiovascular: Normal rate, regular rhythm and normal heart sounds. Exam reveals no gallop and no friction rub.  No murmur heard. Pulmonary/Chest: Effort normal and breath sounds normal. No respiratory  distress. He has no wheezes. He has no rales.  Abdominal: Soft. Bowel sounds are normal. He exhibits no distension and no mass. There is no tenderness. There is no rebound and no guarding.  Musculoskeletal: He exhibits no edema or tenderness.  Lymphadenopathy:    He has no cervical adenopathy.    He has no axillary adenopathy.       Right: No supraclavicular adenopathy present.  Left: No supraclavicular adenopathy present.  Neurological: He is alert and oriented to person, place, and time.  Skin: Skin is warm and dry. No rash noted. No erythema.  Psychiatric: Mood, affect and judgment normal.  Nursing note and vitals reviewed.   Infusion on 03/18/2018  Component Date Value Ref Range Status  . Magnesium 03/18/2018 2.0  1.7 - 2.4 mg/dL Final   Performed at [facility], [address] [place], [place] [address]  . Sodium 03/18/2018 140  135 - 145 mmol/L Final  . Potassium 03/18/2018 3.9  3.5 - 5.1 mmol/L Final  . Chloride 03/18/2018 107  98 - 111 mmol/L Final  . CO2 03/18/2018 28  22 - 32 mmol/L Final  . Glucose, Bld 03/18/2018 184* 70 - 99 mg/dL Final  . BUN 03/18/2018 8  8 - 23 mg/dL Final  . Creatinine, Ser 03/18/2018 0.64  0.61 - 1.24 mg/dL Final  . Calcium 03/18/2018 9.0  8.9 - 10.3 mg/dL Final  . Total Protein 03/18/2018 6.4* 6.5 - 8.1 g/dL Final  . Albumin 03/18/2018 3.6  3.5 - 5.0 g/dL Final  . AST 03/18/2018 22  15 - 41 U/L Final  . ALT 03/18/2018 16  0 - 44 U/L Final  . Alkaline Phosphatase 03/18/2018 69  38 - 126 U/L Final  . Total Bilirubin 03/18/2018 0.9  0.3 - 1.2 mg/dL Final  . GFR calc non Af Amer 03/18/2018 >60  >60 mL/min Final  . GFR calc Af Amer 03/18/2018 >60  >60 mL/min Final   Comment: (NOTE) The eGFR has been calculated using the CKD EPI equation. This calculation has not been validated in all clinical situations. eGFR's persistently <60 mL/min signify possible Chronic Kidney Disease.   Georgiann Hahn gap 03/18/2018 5  5 - 15 Final   Performed at  [facility], [address] [place], [place] [address]  . WBC 03/18/2018 4.3  3.8 - 10.6 K/uL Final  . RBC 03/18/2018 3.03* 4.40 - 5.90 MIL/uL Final  . Hemoglobin 03/18/2018 9.8* 13.0 - 18.0 g/dL Final  . HCT 03/18/2018 28.9* 40.0 - 52.0 % Final  . MCV 03/18/2018 95.1  80.0 - 100.0 fL Final  . MCH 03/18/2018 32.5  26.0 - 34.0 pg Final  . MCHC 03/18/2018 34.1  32.0 - 36.0 g/dL Final  . RDW 03/18/2018 20.0* 11.5 - 14.5 % Final  . Platelets 03/18/2018 224  150 - 440 K/uL Final  . Neutrophils Relative % 03/18/2018 56  % Final  . Neutro Abs 03/18/2018 2.4  1.4 - 6.5 K/uL Final  . Lymphocytes Relative 03/18/2018 23  % Final  . Lymphs Abs 03/18/2018 1.0  1.0 - 3.6 K/uL Final  . Monocytes Relative 03/18/2018 17  % Final  . Monocytes Absolute 03/18/2018 0.7  0.2 - 1.0 K/uL Final  . Eosinophils Relative 03/18/2018 3  % Final  . Eosinophils Absolute 03/18/2018 0.1  0 - 0.7 K/uL Final  . Basophils Relative 03/18/2018 1  % Final  . Basophils Absolute 03/18/2018 0.1  0 - 0.1 K/uL Final   Performed at [facility], [address], [place], [place] [address]    Assessment:  Patrick Mcgee is a 72 y.o. 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  He presented with abdominal pain and weight loss.  Abdomen and pelvic CT on 12/01/2017 revealed a suspicious enhancing, filling defect involving the lower pole collecting system of the left kidney with asymmetric hypoenhancement of the inferior pole  cortex of left kidney noted. Findings were worrisome for urothelial neoplasm.  There was abdominal and pelvic adenopathy compatible with nodal metastasis.  There was a 2.6 cm right para celiac node, 1,6 cm left retroperitoneal node, 1.4 cm right retroperitoneal node, 2.1 cm right common iliac node and 1 cm left external iliac node.  There was aortic atherosclerosis, kidney cysts, and prostate gland enlargement.  Chest CT on 12/07/2017 revealed no evidence  of metastatic disease.    He underwent left ureteroscopy with renal pelvis biopsy and left ureteral stent placement on 12/10/2017.  Findings included a large nodular, vascular tumor within the left lower pole collecting system completely obstructing the entire left lower pole moiety.  Ureter, right kidney, and bladder was normal.  Pathology revealed small fragments of high-grade urothelial carcinoma with a small focus of invasion.  He is s/p cycle #4 carboplatin + gemcitabine (12/21/2017 - 02/25/2018). He is tolerating treatment well.   Chest, abdomen, and pelvic CT on 02/22/2018 revealed marked improvement in nodal metastatic disease in the abdomen/pelvis. There continued to be enhancing material similar to prior in the lower pole collecting system on the left, with hypoenhancement of the subtended parenchyma in the lower third of the left kidney suggesting poor urinary drainage from underlying tumor.  There was no new metastatic lesions are identified.   He has B12 deficiency.  B12 was 247 on 12/31/2017.  He began oral B12 on 01/07/2018.  B12 was 983 on 02/11/2018.  He has diabetes and coronary artery disease s/p MI in 1995.  He is s/p stent placement x 5-6.  Colonoscopy in 2018 was negative.  He has chemotherapy induced anemia requiring Procrit injections every 2 weeks to support his counts (last 02/25/2018).   Symptomatically, he is doing well.  He voices no complaints.  Exam is unremarkable.  Plan: 1. Labs today:  CBC with diff, CMP, Mg. 2. Metastatic urothelial carcinoma - treatment ongoing Tolerating treatment well. Labs reviewed. Blood counts good.  Begin day 1 of cycle #5 gemcitabine. Discuss symptom management.  He has antiemetics and pain medications at home to use on a prn bases.  Interventions are adequate.   3. Chemotherapy induced anemia  - stable Labs reviewed.  Hemoglobin 9.8. Procrit today. 4. Constipation - chronic Bowels well managed. Continue current regimen. 5. Cough  - chronic  Interstitial lung disease.  Follow-up with pulmonary medicine, Dr. Ashby Dawes, in 3 months.  6. HYPOmagnesemia - stable  Magnesium 2.0. Continue Mag-ox 400 mg QOD.  7. Nutrition - stable  Weight up. 8. B12 deficiency  - improved  Continue oral B12. 9. RTC in 1 week for MD assessment, labs (CBC with diff, CMP, Mg), and day 8 of cycle #5 gemcitabine.   [name], MD  03/18/18, 9:57 AM

## 2018-03-19 ENCOUNTER — Ambulatory Visit (INDEPENDENT_AMBULATORY_CARE_PROVIDER_SITE_OTHER): Payer: Medicare Other

## 2018-03-19 VITALS — BP 112/62 | HR 64 | Temp 98.8°F | Ht 68.0 in | Wt 178.8 lb

## 2018-03-19 DIAGNOSIS — Z0000 Encounter for general adult medical examination without abnormal findings: Secondary | ICD-10-CM | POA: Diagnosis not present

## 2018-03-19 NOTE — Progress Notes (Signed)
Subjective:   Patrick Mcgee is a 72 y.o. male who presents for Medicare Annual/Subsequent preventive examination.  Review of Systems:  N/A  Cardiac Risk Factors include: advanced age (>72men, >42 women);diabetes mellitus;Other (see comment);dyslipidemia;hypertension;male gender(currently on chemotherapy)     Objective:    Vitals: BP 112/62 (BP Location: Right Arm)   Pulse 64   Temp 98.8 F (37.1 C) (Oral)   Ht 5\' 8"  (1.727 m)   Wt 178 lb 12.8 oz (81.1 kg)   BMI 27.19 kg/m   Body mass index is 27.19 kg/m.  Advanced Directives 03/19/2018 03/18/2018 03/04/2018 02/25/2018 02/11/2018 02/04/2018 01/21/2018  Does Patient Have a Medical Advance Directive? Yes Yes Yes Yes Yes Yes Yes  Type of Paramedic of [place];Living will - - [place];Living will - - -  Does patient want to make changes to medical advance directive? - - - No - Patient declined - - -  Copy of [place] in Chart? Yes - - Yes - - -  Would patient like information on creating a medical advance directive? - - - - - - -    Tobacco Social History   Tobacco Use  Smoking Status Former Smoker  . Types: Cigars  Smokeless Tobacco Former Systems developer  . Types: Chew  Tobacco Comment   on occasion     Counseling given: Not Answered Comment: on occasion   Clinical Intake:  Pre-visit preparation completed: Yes  Pain : No/denies pain Pain Score: 0-No pain     Nutritional Status: BMI 25 -29 Overweight Nutritional Risks: None Diabetes: Yes(type 2) CBG done?: No Did pt. bring in CBG monitor from home?: No  How often do you need to have someone help you when you read instructions, pamphlets, or other written materials from your doctor or pharmacy?: 1 - Never  Interpreter Needed?: No  Information entered by :: [facility], LPN  Past Medical History:  Diagnosis Date  . Acid reflux   . Anxiety   . Depression   . Diabetes mellitus without complication ([place])   .  Hyperlipidemia   . Hypertension   . Myocardial infarction ([place]) 1995  . Sleep apnea   . 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 CATARACT EXTRACTION Left   . COLONOSCOPY  2010   [facility]  . COLONOSCOPY WITH PROPOFOL N/A 10/04/2016   Procedure: COLONOSCOPY WITH PROPOFOL;  Surgeon: [name], MD;  Location: [facility] ENDOSCOPY;  Service: Endoscopy;  Laterality: N/A;  . [place], 2000, 2001, 2014  . CYSTOSCOPY W/ RETROGRADES Bilateral 12/10/2017   Procedure: CYSTOSCOPY WITH RETROGRADE PYELOGRAM;  Surgeon: [name], MD;  Location: [facility] ORS;  Service: Urology;  Laterality: Bilateral;  . CYSTOSCOPY WITH STENT PLACEMENT Left 12/10/2017   Procedure: CYSTOSCOPY WITH STENT PLACEMENT;  Surgeon: [name], MD;  Location: [facility] ORS;  Service: Urology;  Laterality: Left;  . INGUINAL HERNIA REPAIR Right 08/09/2015   Procedure: HERNIA REPAIR INGUINAL ADULT;  Surgeon: [name], MD;  Location: [facility] ORS;  Service: General;  Laterality: Right;  . NASAL SINUS SURGERY    . nuclear stress test    . PORTA CATH INSERTION N/A 12/19/2017   Procedure: PORTA CATH INSERTION;  Surgeon: [name], MD;  Location: [facility];  Service: Cardiovascular;  Laterality: N/A;  . URETERAL BIOPSY Left 12/10/2017   Procedure: URETERAL & renal  PELVIS BIOPSY;  Surgeon: [name], MD;  Location: [facility] ORS;  Service: Urology;  Laterality: Left;  . URETEROSCOPY Left 12/10/2017   Procedure: URETEROSCOPY;  Surgeon: [name], MD;  Location: [facility] ORS;  Service: Urology;  Laterality: Left;   Family History  Problem Relation Age of Onset  . Heart disease Mother   . Cancer Father        Lung and colon cancer  . Heart disease Father   . Emphysema Maternal Grandfather   . Tuberculosis Maternal Grandmother    Social History   Socioeconomic History  . Marital status: Married     Spouse name: Not on file  . Number of children: 1  . Years of education: Not on file  . Highest education level: Associate degree: occupational, Hotel manager, or vocational program  Occupational History  . Occupation: retired  Scientific laboratory technician  . Financial resource strain: Not hard at all  . Food insecurity:    Worry: Never true    Inability: Never true  . Transportation needs:    Medical: No    Non-medical: No  Tobacco Use  . Smoking status: Former Smoker    Types: Cigars  . Smokeless tobacco: Former Systems developer    Types: Chew  . Tobacco comment: on occasion  Substance and Sexual Activity  . Alcohol use: Not Currently    Comment: rarely  . Drug use: No  . Sexual activity: Not Currently  Lifestyle  . Physical activity:    Days per week: Not on file    Minutes per session: Not on file  . Stress: Only a little  Relationships  . Social connections:    Talks on phone: Not on file    Gets together: Not on file    Attends religious service: Not on file    Active member of club or organization: Not on file    Attends meetings of clubs or organizations: Not on file    Relationship status: Not on file  Other Topics Concern  . Not on file  Social History Narrative  . Not on file    Outpatient Encounter Medications as of 03/19/2018  Medication Sig  . acetaminophen (TYLENOL) 500 MG tablet Take 500 mg by mouth every 6 (six) hours as needed for moderate pain. When taking oxycodone 5 mg tablets  . ALPRAZolam (XANAX) 0.25 MG tablet Take 1 tablet (0.25 mg total) by mouth 3 (three) times daily as needed for anxiety.  Marland Kitchen atorvastatin (LIPITOR) 40 MG tablet TAKE ONE TABLET BY MOUTH ONCE DAILY (Patient taking differently: TAKE ONE TABLET BY MOUTH DAILY AT BEDTIME)  . carvedilol (COREG) 12.5 MG tablet TAKE ONE TABLET BY MOUTH TWICE DAILY  . clobetasol cream (TEMOVATE) 1.09 % Apply 1 application topically as needed (for skin rash).  Marland Kitchen doxepin (SINEQUAN) 10 MG capsule Take 1 capsule (10 mg total) by  mouth 4 (four) times daily as needed.  . feeding supplement, ENSURE ENLIVE, (ENSURE ENLIVE) LIQD Take 237 mLs by mouth 3 (three) times daily between meals.  . fentaNYL (DURAGESIC - DOSED MCG/HR) 25 MCG/HR patch Place 1 patch (25 mcg total) onto the skin every 3 (three) days.  . fluticasone (FLONASE) 50 MCG/ACT nasal spray Place 2 sprays into both nostrils daily.  Marland Kitchen glucose blood (CONTOUR NEXT TEST) test strip Checks sugar 4 times daily. DX E11.9-strips for Contour next EZ meter  . HYDROmorphone (DILAUDID) 2 MG tablet Take 1 tablet (2 mg total) by mouth every 4 (four) hours as needed for severe pain (breakthrough  pain).  . Ivermectin (SOOLANTRA) 1 % CREA Apply topically at bedtime. To face  . lactulose (CEPHULAC) 20 g packet Take 1 packet (20 g total) by mouth 2 (two) times daily as needed (constipation).  Marland Kitchen lidocaine-prilocaine (EMLA) cream Apply 1 application topically as needed.  . linaclotide (LINZESS) 290 MCG CAPS capsule Take 1 capsule (290 mcg total) by mouth daily before breakfast.  . losartan (COZAAR) 100 MG tablet TAKE ONE TABLET BY MOUTH ONCE DAILY (Patient taking differently: TAKE ONE TABLET BY MOUTH DAILY AT BEDTIME)  . meclizine (ANTIVERT) 25 MG tablet Take 25 mg by mouth 3 (three) times daily as needed for dizziness.  . metFORMIN (GLUCOPHAGE) 1000 MG tablet Take 1 tablet (1,000 mg total) by mouth 2 (two) times daily with a meal.  . nystatin cream (MYCOSTATIN) APPLY  CREAM TOPICALLY TWICE DAILY AROUND  THE  GROIN  AREA  . ondansetron (ZOFRAN) 8 MG tablet Take 1 tablet (8 mg total) by mouth every 8 (eight) hours as needed for nausea or vomiting (if needed beginning 3 days after chemotherapy).  Marland Kitchen oxyCODONE (OXYCONTIN) 15 mg 12 hr tablet Take 1 tablet (15 mg total) by mouth every 12 (twelve) hours.  . polyethylene glycol (MIRALAX) packet Take 17 g by mouth 2 (two) times daily.  . sodium chloride 1 g tablet Take 1 tablet (1 g total) by mouth 3 (three) times daily with meals.  . tamsulosin  (FLOMAX) 0.4 MG CAPS capsule Take 1 capsule (0.4 mg total) by mouth daily.   No facility-administered encounter medications on file as of 03/19/2018.     Activities of Daily Living In your present state of health, do you have any difficulty performing the following activities: 03/19/2018 12/17/2017  Hearing? Y -  Comment Does not wear hearing aids.  -  Vision? Y -  Comment Only up close, pt states this may be related to the chemo.  -  Difficulty concentrating or making decisions? N -  Walking or climbing stairs? N -  Dressing or bathing? N -  Doing errands, shopping? N N  Preparing Food and eating ? N -  Using the Toilet? N -  In the past six months, have you accidently leaked urine? N -  Do you have problems with loss of bowel control? N -  Managing your Medications? N -  Managing your Finances? Y -  Comment Wife manages finances.  -  Housekeeping or managing your Housekeeping? N -  Some recent data might be hidden    Patient Care Team: Jerrol Banana., MD as PCP - General (Family Medicine) Dingeldein, [name], MD as Consulting Physician (Ophthalmology) [name] as Consulting Physician (Internal Medicine) [name], MD as Consulting Physician (Urology) [name], MD as Referring Physician (Hematology and Oncology) [name], MD as Consulting Physician (Pulmonary Disease)   Assessment:   This is a routine wellness examination for [place].  Exercise Activities and Dietary recommendations Current Exercise Habits: Home exercise routine, Type of exercise: walking, Time (Minutes): 30, Frequency (Times/Week): 3, Weekly Exercise (Minutes/Week): 90, Intensity: Mild  Goals    . DIET - INCREASE WATER INTAKE     Recommend increasing water intake to 6-8 glasses a day.     . Exercise 3x per week (30 min per time)     Recommend to return to exercising 3 days a week for 30 minutes.        Fall Risk Fall Risk  03/19/2018 03/13/2017 03/08/2016 03/29/2015   Falls in the past year?  No No No No   Is the patient's home free of loose throw rugs in walkways, pet beds, electrical cords, etc?   yes      Grab bars in the bathroom? no      Handrails on the stairs?   yes      Adequate lighting?   yes  Timed Get Up and Go Performed: N/A  Depression Screen PHQ 2/9 Scores 03/19/2018 03/13/2017 03/08/2016 03/29/2015  PHQ - 2 Score 1 0 0 0    Cognitive Function: Pt declined screening today.      6CIT Screen 03/13/2017  What Year? 0 points  What month? 0 points  What time? 0 points  Count back from 20 0 points  Months in reverse 0 points  Repeat phrase 0 points  Total Score 0    Immunization History  Administered Date(s) Administered  . Influenza, High Dose Seasonal PF 06/20/2016, 03/13/2017  . Pneumococcal Conjugate-13 01/29/2014  . Pneumococcal Polysaccharide-23 09/06/2011  . Tdap 08/27/2007  . Zoster 10/26/2010    Qualifies for Shingles Vaccine? Due for Shingles vaccine. Declined my offer to administer today. Education has been provided regarding the importance of this vaccine. Pt has been advised to call her insurance company to determine her out of pocket expense. Advised she may also receive this vaccine at her local pharmacy or Health Dept. Verbalized acceptance and understanding.  Screening Tests Health Maintenance  Topic Date Due  . FOOT EXAM  04/29/1956  . TETANUS/TDAP  08/26/2017  . OPHTHALMOLOGY EXAM  10/24/2017  . INFLUENZA VACCINE  02/07/2018  . HEMOGLOBIN A1C  08/30/2018  . COLONOSCOPY  10/05/2026  . Hepatitis C Screening  Completed  . PNA vac Low Risk Adult  Completed   Cancer Screenings: Lung: Low Dose CT Chest recommended if Age 85-80 years, 30 pack-year currently smoking OR have quit w/in 15years. Patient does not qualify. Colorectal: Up to date  Additional Screenings:  Hepatitis C Screening: Up to date      Plan:  I have personally reviewed and addressed the Medicare Annual Wellness questionnaire and have noted  the following in the patient's chart:  A. Medical and social history B. Use of alcohol, tobacco or illicit drugs  C. Current medications and supplements D. Functional ability and status E.  Nutritional status F.  Physical activity G. Advance directives H. List of other physicians I.  Hospitalizations, surgeries, and ER visits in previous 12 months J.  [place] such as hearing and vision if needed, cognitive and depression L. Referrals and appointments - none  In addition, I have reviewed and discussed with patient certain preventive protocols, quality metrics, and best practice recommendations. A written personalized care plan for preventive services as well as general preventive health recommendations were provided to patient.  See attached scanned questionnaire for additional information.   Signed,  Fabio Neighbors, LPN Nurse Health Advisor   Nurse Recommendations: Pt declined the tetanus and influenza vaccines today. Pt states he is currently on chemotherapy and would rather check with his oncologist first before receiving any vaccine. Pt needs a diabetic foot exam at next OV. Pt plans to set up an eye exam after he finishes chemo.

## 2018-03-19 NOTE — Patient Instructions (Addendum)
Patrick Mcgee , Thank you for taking time to come for your Medicare Wellness Visit. I appreciate your ongoing commitment to your health goals. Please review the following plan we discussed and let me know if I can assist you in the future.   Screening recommendations/referrals: Colonoscopy: Up to date Recommended yearly ophthalmology/optometry visit for glaucoma screening and checkup Recommended yearly dental visit for hygiene and checkup  Vaccinations: Influenza vaccine: Pt declines today.  Pneumococcal vaccine: Up to date Tdap vaccine: Pt declines today.  Shingles vaccine: Pt declines today.     Advanced directives: Already on file.  Conditions/risks identified: Recommend increasing water intake to 6-8 glasses a day.   Next appointment: 06/13/18 with Dr Rosanna Randy.   Preventive Care 32 Years and Older, Male Preventive care refers to lifestyle choices and visits with your health care provider that can promote health and wellness. What does preventive care include?  A yearly physical exam. This is also called an annual well check.  Dental exams once or twice a year.  Routine eye exams. Ask your health care provider how often you should have your eyes checked.  Personal lifestyle choices, including:  Daily care of your teeth and gums.  Regular physical activity.  Eating a healthy diet.  Avoiding tobacco and drug use.  Limiting alcohol use.  Practicing safe sex.  Taking low doses of aspirin every day.  Taking vitamin and mineral supplements as recommended by your health care provider. What happens during an annual well check? The services and screenings done by your health care provider during your annual well check will depend on your age, overall health, lifestyle risk factors, and family history of disease. Counseling  Your health care provider may ask you questions about your:  Alcohol use.  Tobacco use.  Drug use.  Emotional well-being.  Home and relationship  well-being.  Sexual activity.  Eating habits.  History of falls.  Memory and ability to understand (cognition).  Work and work Statistician. Screening  You may have the following tests or measurements:  Height, weight, and BMI.  Blood pressure.  Lipid and cholesterol levels. These may be checked every 5 years, or more frequently if you are over 17 years old.  Skin check.  Lung cancer screening. You may have this screening every year starting at age 62 if you have a 30-pack-year history of smoking and currently smoke or have quit within the past 15 years.  Fecal occult blood test (FOBT) of the stool. You may have this test every year starting at age 53.  Flexible sigmoidoscopy or colonoscopy. You may have a sigmoidoscopy every 5 years or a colonoscopy every 10 years starting at age 53.  Prostate cancer screening. Recommendations will vary depending on your family history and other risks.  Hepatitis C blood test.  Hepatitis B blood test.  Sexually transmitted disease (STD) testing.  Diabetes screening. This is done by checking your blood sugar (glucose) after you have not eaten for a while (fasting). You may have this done every 1-3 years.  Abdominal aortic aneurysm (AAA) screening. You may need this if you are a current or former smoker.  Osteoporosis. You may be screened starting at age 82 if you are at high risk. Talk with your health care provider about your test results, treatment options, and if necessary, the need for more tests. Vaccines  Your health care provider may recommend certain vaccines, such as:  Influenza vaccine. This is recommended every year.  Tetanus, diphtheria, and acellular pertussis (Tdap, Td)  vaccine. You may need a Td booster every 10 years.  Zoster vaccine. You may need this after age 39.  Pneumococcal 13-valent conjugate (PCV13) vaccine. One dose is recommended after age 24.  Pneumococcal polysaccharide (PPSV23) vaccine. One dose is  recommended after age 72. Talk to your health care provider about which screenings and vaccines you need and how often you need them. This information is not intended to replace advice given to you by your health care provider. Make sure you discuss any questions you have with your health care provider. Document Released: 07/23/2015 Document Revised: 03/15/2016 Document Reviewed: 04/27/2015 Elsevier Interactive Patient Education  2017 [place] Prevention in the Home Falls can cause injuries. They can happen to people of all ages. There are many things you can do to make your home safe and to help prevent falls. What can I do on the outside of my home?  Regularly fix the edges of walkways and driveways and fix any cracks.  Remove anything that might make you trip as you walk through a door, such as a raised step or threshold.  Trim any bushes or trees on the path to your home.  Use bright outdoor lighting.  Clear any walking paths of anything that might make someone trip, such as rocks or tools.  Regularly check to see if handrails are loose or broken. Make sure that both sides of any steps have handrails.  Any raised decks and porches should have guardrails on the edges.  Have any leaves, snow, or ice cleared regularly.  Use sand or salt on walking paths during winter.  Clean up any spills in your garage right away. This includes oil or grease spills. What can I do in the bathroom?  Use night lights.  Install grab bars by the toilet and in the tub and shower. Do not use towel bars as grab bars.  Use non-skid mats or decals in the tub or shower.  If you need to sit down in the shower, use a plastic, non-slip stool.  Keep the floor dry. Clean up any water that spills on the floor as soon as it happens.  Remove soap buildup in the tub or shower regularly.  Attach bath mats securely with double-sided non-slip rug tape.  Do not have throw rugs and other things on  the floor that can make you trip. What can I do in the bedroom?  Use night lights.  Make sure that you have a light by your bed that is easy to reach.  Do not use any sheets or blankets that are too big for your bed. They should not hang down onto the floor.  Have a firm chair that has side arms. You can use this for support while you get dressed.  Do not have throw rugs and other things on the floor that can make you trip. What can I do in the kitchen?  Clean up any spills right away.  Avoid walking on wet floors.  Keep items that you use a lot in easy-to-reach places.  If you need to reach something above you, use a strong step stool that has a grab bar.  Keep electrical cords out of the way.  Do not use floor polish or wax that makes floors slippery. If you must use wax, use non-skid floor wax.  Do not have throw rugs and other things on the floor that can make you trip. What can I do with my stairs?  Do not leave  any items on the stairs.  Make sure that there are handrails on both sides of the stairs and use them. Fix handrails that are broken or loose. Make sure that handrails are as long as the stairways.  Check any carpeting to make sure that it is firmly attached to the stairs. Fix any carpet that is loose or worn.  Avoid having throw rugs at the top or bottom of the stairs. If you do have throw rugs, attach them to the floor with carpet tape.  Make sure that you have a light switch at the top of the stairs and the bottom of the stairs. If you do not have them, ask someone to add them for you. What else can I do to help prevent falls?  Wear shoes that:  Do not have high heels.  Have rubber bottoms.  Are comfortable and fit you well.  Are closed at the toe. Do not wear sandals.  If you use a stepladder:  Make sure that it is fully opened. Do not climb a closed stepladder.  Make sure that both sides of the stepladder are locked into place.  Ask someone to  hold it for you, if possible.  Clearly mark and make sure that you can see:  Any grab bars or handrails.  First and last steps.  Where the edge of each step is.  Use tools that help you move around (mobility aids) if they are needed. These include:  Canes.  Walkers.  Scooters.  Crutches.  Turn on the lights when you go into a dark area. Replace any light bulbs as soon as they burn out.  Set up your furniture so you have a clear path. Avoid moving your furniture around.  If any of your floors are uneven, fix them.  If there are any pets around you, be aware of where they are.  Review your medicines with your doctor. Some medicines can make you feel dizzy. This can increase your chance of falling. Ask your doctor what other things that you can do to help prevent falls. This information is not intended to replace advice given to you by your health care provider. Make sure you discuss any questions you have with your health care provider. Document Released: 04/22/2009 Document Revised: 12/02/2015 Document Reviewed: 07/31/2014 Elsevier Interactive Patient Education  2017 [employer].

## 2018-03-24 ENCOUNTER — Other Ambulatory Visit: Payer: Self-pay | Admitting: Family Medicine

## 2018-03-24 NOTE — Progress Notes (Signed)
[facility] day:  03/25/2018   Chief Complaint: Patrick Mcgee is a 72 y.o. male with metastatic urothelial carcinoma who is seen for assessment prior to day 8 of cycle #5 gemcitabine.  HPI:  The patient was last seen in the medical oncology clinic on 03/18/2018.  At that time, patient was feeling "alright". Energy level was good. Neuropathy was stable. Eating well; weight up 8 pounds. Exam was unremarkable. He received day 1 of cycle #5 carboplatin + gemcitabine and Procrit.   In the interim, he has felt "great".  He denies any problems.  Energy level is a little "low".  He has a little loose stools.  Patient is going to [facility] from 03/30/2018 - 04/06/2018.   Past Medical History:  Diagnosis Date  . Acid reflux   . Anxiety   . Depression   . Diabetes mellitus without complication ([place])   . Hyperlipidemia   . Hypertension   . Myocardial infarction ([place]) 1995  . Sleep apnea   . Urothelial cancer ([place]) 11/2017   Left Urothelial mass, chemo tx's    Past Surgical History:  Procedure Laterality Date  . CATARACT EXTRACTION Left   . COLONOSCOPY  2010   [facility]  . COLONOSCOPY WITH PROPOFOL N/A 10/04/2016   Procedure: COLONOSCOPY WITH PROPOFOL;  Surgeon: [name], MD;  Location: [facility] ENDOSCOPY;  Service: Endoscopy;  Laterality: N/A;  . [place], 2000, 2001, 2014  . CYSTOSCOPY W/ RETROGRADES Bilateral 12/10/2017   Procedure: CYSTOSCOPY WITH RETROGRADE PYELOGRAM;  Surgeon: [name], MD;  Location: [facility] ORS;  Service: Urology;  Laterality: Bilateral;  . CYSTOSCOPY WITH STENT PLACEMENT Left 12/10/2017   Procedure: CYSTOSCOPY WITH STENT PLACEMENT;  Surgeon: [name], MD;  Location: [facility] ORS;  Service: Urology;  Laterality: Left;  . INGUINAL HERNIA REPAIR Right 08/09/2015   Procedure: HERNIA REPAIR INGUINAL ADULT;  Surgeon: [name], MD;  Location: [facility] ORS;  Service: General;   Laterality: Right;  . NASAL SINUS SURGERY    . nuclear stress test    . PORTA CATH INSERTION N/A 12/19/2017   Procedure: PORTA CATH INSERTION;  Surgeon: [name], MD;  Location: [facility];  Service: Cardiovascular;  Laterality: N/A;  . URETERAL BIOPSY Left 12/10/2017   Procedure: URETERAL & renal PELVIS BIOPSY;  Surgeon: [name], MD;  Location: [facility] ORS;  Service: Urology;  Laterality: Left;  . URETEROSCOPY Left 12/10/2017   Procedure: URETEROSCOPY;  Surgeon: [name], MD;  Location: [facility] ORS;  Service: Urology;  Laterality: Left;    Family History  Problem Relation Age of Onset  . Heart disease Mother   . Cancer Father        Lung and colon cancer  . Heart disease Father   . Emphysema Maternal Grandfather   . Tuberculosis Maternal Grandmother     Social History:  reports that he has quit smoking. His smoking use included cigars. He has quit using smokeless tobacco.  His smokeless tobacco use included chew. He reports that he drank alcohol. He reports that he does not use drugs.  Patient is retired from the [employer]. Patient denies known exposures to radiation on toxins. The patient has a family trip planned to [facility] between 03/30/2018 - 04/06/2018. The patient is accompanied by his wife, Patrick Mcgee, today.  Allergies:  Allergies  Allergen Reactions  . Sulfa Antibiotics Other (See Comments)    Joint pain  .  Ace Inhibitors Cough  . Invokana [Canagliflozin] Other (See Comments)    Leg pain  . Penicillins Rash    Has patient had a PCN reaction causing immediate rash, facial/tongue/throat swelling, SOB or lightheadedness with hypotension: Yes Has patient had a PCN reaction causing severe rash involving mucus membranes or skin necrosis: No Has patient had a PCN reaction that required hospitalization: No Has patient had a PCN reaction occurring within the last 10 years: No If all of the above answers are "NO", then may proceed with Cephalosporin use.     Current Medications: Current Outpatient Medications  Medication Sig Dispense Refill  . atorvastatin (LIPITOR) 40 MG tablet Take 1 tablet (40 mg total) by mouth at bedtime. 90 tablet 3  . carvedilol (COREG) 12.5 MG tablet TAKE ONE TABLET BY MOUTH TWICE DAILY 180 tablet 3  . feeding supplement, ENSURE ENLIVE, (ENSURE ENLIVE) LIQD Take 237 mLs by mouth 3 (three) times daily between meals. 90 Bottle 0  . fluticasone (FLONASE) 50 MCG/ACT nasal spray Place 2 sprays into both nostrils daily. 16 g 12  . glucose blood (CONTOUR NEXT TEST) test strip Checks sugar 4 times daily. DX E11.9-strips for Contour next EZ meter 360 each 3  . Ivermectin (SOOLANTRA) 1 % CREA Apply topically at bedtime. To face    . lidocaine-prilocaine (EMLA) cream Apply 1 application topically as needed. 30 g 3  . losartan (COZAAR) 100 MG tablet TAKE ONE TABLET BY MOUTH ONCE DAILY (Patient taking differently: TAKE ONE TABLET BY MOUTH DAILY AT BEDTIME) 90 tablet 3  . metFORMIN (GLUCOPHAGE) 1000 MG tablet Take 1 tablet (1,000 mg total) by mouth 2 (two) times daily with a meal. 180 tablet 3  . acetaminophen (TYLENOL) 500 MG tablet Take 500 mg by mouth every 6 (six) hours as needed for moderate pain. When taking oxycodone 5 mg tablets    . ALPRAZolam (XANAX) 0.25 MG tablet Take 1 tablet (0.25 mg total) by mouth 3 (three) times daily as needed for anxiety. (Patient not taking: Reported on 03/25/2018) 20 tablet 0  . clobetasol cream (TEMOVATE) 3.01 % Apply 1 application topically as needed (for skin rash). (Patient not taking: Reported on 03/25/2018) 30 g 1  . fentaNYL (DURAGESIC - DOSED MCG/HR) 25 MCG/HR patch Place 1 patch (25 mcg total) onto the skin every 3 (three) days. (Patient not taking: Reported on 03/25/2018) 10 patch 0  . lactulose (CEPHULAC) 20 g packet Take 1 packet (20 g total) by mouth 2 (two) times daily as needed (constipation). (Patient not taking: Reported on 03/25/2018) 60 each 0  . meclizine (ANTIVERT) 25 MG tablet Take  25 mg by mouth 3 (three) times daily as needed for dizziness.    . nystatin cream (MYCOSTATIN) APPLY  CREAM TOPICALLY TWICE DAILY AROUND  THE  GROIN  AREA (Patient not taking: Reported on 03/25/2018) 15 g 1  . ondansetron (ZOFRAN) 8 MG tablet Take 1 tablet (8 mg total) by mouth every 8 (eight) hours as needed for nausea or vomiting (if needed beginning 3 days after chemotherapy). (Patient not taking: Reported on 03/25/2018) 20 tablet 0   No current facility-administered medications for this visit.    Facility-Administered Medications Ordered in Other Visits  Medication Dose Route Frequency Provider Last Rate Last Dose  . heparin lock flush 100 unit/mL  500 Units Intravenous Once [name], MD        Review of Systems  Constitutional: Positive for weight loss (8 pounds). Negative for diaphoresis, fever and malaise/fatigue.  HENT:  Negative.  Negative for congestion, ear discharge, ear pain, nosebleeds, sinus pain and sore throat.   Eyes: Negative.  Negative for blurred vision, double vision, photophobia, pain, discharge and redness.  Respiratory: Positive for cough (chronic). Negative for hemoptysis, sputum production and shortness of breath.   Cardiovascular: Negative for chest pain, palpitations, orthopnea, leg swelling and PND.       MI s/p angioplasty and 5-6 stents (last 2015). On 3 antihypertensive medications. (+) Sleep apnea - uses nocturnal PAP therapy.   Gastrointestinal: Positive for constipation (chronic) and heartburn (on PPI). Negative for abdominal pain, blood in stool, diarrhea, melena, nausea and vomiting.  Genitourinary: Negative.  Negative for dysuria, frequency, hematuria and urgency.  Musculoskeletal: Negative.  Negative for back pain, falls, joint pain and myalgias.  Skin: Positive for rash (recurrent intertriginous rash - uses Nystatin topical PRN). Negative for itching.  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 Negative for  dizziness, tremors, focal weakness, weakness and headaches.  Endo/Heme/Allergies: Positive for environmental allergies (seasonal allergies). Does not bruise/bleed easily.       Diabetes.  Psychiatric/Behavioral: Negative for depression and memory loss. The patient is not nervous/anxious and does not have insomnia.   All other systems reviewed and are negative.  Performance status (ECOG): 2 - Symptomatic, <50% confined to bed  Vital signs BP (!) 165/80 (BP Location: Left Arm, Patient Position: Sitting)   Pulse (!) 56   Temp (!) 94.4 F (34.7 C) (Tympanic)   Resp 18   Wt 170 lb 8 oz (77.3 kg)   BMI 25.92 kg/m   Physical Exam  Constitutional: He is oriented to person, place, and time and well-developed, well-nourished, and in no distress.  HENT:  Head: Normocephalic and atraumatic.  Short gray hair.  Eyes: Pupils are equal, round, and reactive to light. EOM are normal. No scleral icterus.  Brown eyes.  Neck: Normal range of motion. Neck supple. No tracheal deviation present. No thyromegaly present.  Cardiovascular: Normal rate, regular rhythm and normal heart sounds. Exam reveals no gallop and no friction rub.  No murmur heard. Pulmonary/Chest: Effort normal and breath sounds normal. No respiratory distress. He has no wheezes. He has no rales.  Abdominal: Soft. Bowel sounds are normal. He exhibits no distension. There is no tenderness.  Musculoskeletal: Normal range of motion. He exhibits no edema or tenderness.  Lymphadenopathy:    He has no cervical adenopathy.    He has no axillary adenopathy.       Right: No inguinal and no supraclavicular adenopathy present.       Left: No inguinal and no supraclavicular adenopathy present.  Neurological: He is alert and oriented to person, place, and time.  Skin: Skin is warm and dry. No rash noted. No erythema.  Psychiatric: Mood, affect and judgment normal.  Nursing note and vitals reviewed.   Infusion on 03/25/2018  Component Date  Value Ref Range Status  . Magnesium 03/25/2018 2.0  1.7 - 2.4 mg/dL Final   Performed at [facility], [address], [place],  [address]  . Sodium 03/25/2018 138  135 - 145 mmol/L Final  . Potassium 03/25/2018 4.0  3.5 - 5.1 mmol/L Final  . Chloride 03/25/2018 104  98 - 111 mmol/L Final  . CO2 03/25/2018 29  22 - 32 mmol/L Final  . Glucose, Bld 03/25/2018 133* 70 - 99 mg/dL Final  . BUN 03/25/2018 16  8 - 23 mg/dL Final  . Creatinine, Ser 03/25/2018 0.74  0.61 - 1.24  mg/dL Final  . Calcium 03/25/2018 9.4  8.9 - 10.3 mg/dL Final  . Total Protein 03/25/2018 6.7  6.5 - 8.1 g/dL Final  . Albumin 03/25/2018 3.9  3.5 - 5.0 g/dL Final  . AST 03/25/2018 22  15 - 41 U/L Final  . ALT 03/25/2018 17  0 - 44 U/L Final  . Alkaline Phosphatase 03/25/2018 70  38 - 126 U/L Final  . Total Bilirubin 03/25/2018 0.9  0.3 - 1.2 mg/dL Final  . GFR calc non Af Amer 03/25/2018 >60  >60 mL/min Final  . GFR calc Af Amer 03/25/2018 >60  >60 mL/min Final   Comment: (NOTE) The eGFR has been calculated using the CKD EPI equation. This calculation has not been validated in all clinical situations. eGFR's persistently <60 mL/min signify possible Chronic Kidney Disease.   Georgiann Hahn gap 03/25/2018 5  5 - 15 Final   Performed at [facility], [address] [place], [place] [address]  . WBC 03/25/2018 3.4* 3.8 - 10.6 K/uL Final  . RBC 03/25/2018 3.06* 4.40 - 5.90 MIL/uL Final  . Hemoglobin 03/25/2018 9.9* 13.0 - 18.0 g/dL Final  . HCT 03/25/2018 29.3* 40.0 - 52.0 % Final  . MCV 03/25/2018 95.8  80.0 - 100.0 fL Final  . MCH 03/25/2018 32.4  26.0 - 34.0 pg Final  . MCHC 03/25/2018 33.8  32.0 - 36.0 g/dL Final  . RDW 03/25/2018 18.2* 11.5 - 14.5 % Final  . Platelets 03/25/2018 106* 150 - 440 K/uL Final  . Neutrophils Relative % 03/25/2018 62  % Final  . Neutro Abs 03/25/2018 2.1  1.4 - 6.5 K/uL Final  . Lymphocytes Relative 03/25/2018 24  % Final  . Lymphs Abs 03/25/2018 0.8* 1.0 - 3.6 K/uL  Final  . Monocytes Relative 03/25/2018 10  % Final  . Monocytes Absolute 03/25/2018 0.3  0.2 - 1.0 K/uL Final  . Eosinophils Relative 03/25/2018 2  % Final  . Eosinophils Absolute 03/25/2018 0.1  0 - 0.7 K/uL Final  . Basophils Relative 03/25/2018 2  % Final  . Basophils Absolute 03/25/2018 0.1  0 - 0.1 K/uL Final   Performed at [facility], [address] [place], [place] [address]    Assessment:  Patrick Mcgee is a 72 y.o. 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  He presented with abdominal pain and weight loss.  Abdomen and pelvic CT on 12/01/2017 revealed a suspicious enhancing, filling defect involving the lower pole collecting system of the left kidney with asymmetric hypoenhancement of the inferior pole cortex of left kidney noted. Findings were worrisome for urothelial neoplasm.  There was abdominal and pelvic adenopathy compatible with nodal metastasis.  There was a 2.6 cm right para celiac node, 1,6 cm left retroperitoneal node, 1.4 cm right retroperitoneal node, 2.1 cm right common iliac node and 1 cm left external iliac node.  There was aortic atherosclerosis, kidney cysts, and prostate gland enlargement.  Chest CT on 12/07/2017 revealed no evidence of metastatic disease.    He underwent left ureteroscopy with renal pelvis biopsy and left ureteral stent placement on 12/10/2017.  Findings included a large nodular, vascular tumor within the left lower pole collecting system completely obstructing the entire left lower pole moiety.  Ureter, right kidney, and bladder was normal.  Pathology revealed small fragments of high-grade urothelial carcinoma with a small focus of invasion.  He is s/p day 1 of cycle #5 carboplatin + gemcitabine (12/21/2017 - 03/18/2018). He  is tolerating treatment well.   Chest, abdomen, and pelvic CT on 02/22/2018 revealed marked improvement in nodal metastatic disease in the abdomen/pelvis. There  continued to be enhancing material similar to prior in the lower pole collecting system on the left, with hypoenhancement of the subtended parenchyma in the lower third of the left kidney suggesting poor urinary drainage from underlying tumor.  There was no new metastatic lesions are identified.   He has B12 deficiency.  B12 was 247 on 12/31/2017.  He began oral B12 on 01/07/2018.  B12 was 983 on 02/11/2018.  He has diabetes and coronary artery disease s/p MI in 1995.  He is s/p stent placement x 5-6.  Colonoscopy in 2018 was negative.  He has chemotherapy induced anemia requiring Procrit injections every 2 weeks to support his counts (last 03/18/2018).   Symptomatically, he is doing well.  Exam is stable.  Platelet count is 106,000.  Plan: 1. Labs today: CBC with diff, CMP, Mg. 2. Metastatic urothelial carcinoma  Doing well. Tolerating treatments with minimal side effects.  Labs reviewed. Blood counts stable and adequate enough for treatment. Discuss drop in platelet count from 224,000 to 106,000.  Discuss patient's travel plans.  Discuss holding gemcitabine secondary to travel plans.  Anticipate next abdomen and pelvic CT on 05/25/2018 (coordinate with chest CT). Discuss symptom management.  Patient has antiemetics and pain medications at home to use on a PRN basis. Patient  advising that the  prescribed interventions are adequate at this point. Continue all medications as previously prescribed.  3. Chemotherapy induced anemia  Hemoglobin 9.9 and hematocrit 29.3.  Received Procrit on 03/18/2018.    Continue every 2 weeks. 4. Constipation  Stools are slightly loose.  Bowels managed on current regimen. Using PRN interventions to control.  5. Cough  Chronic. Patient has interstitial lung disease.   High resolution chest CT on 05/31/2018.  Scheduled to follow up with pulmonary medicine Waldemar Dickens, MD) in 3 months.  6. HYPOmagnesemia  Magnesium stable at 2.0  Continue  Mag-ox 400 mg QOD. 7. B12 deficiency  Continues on oral B12 1,000 daily.  8. Nutritional status  Appetite adequate. Weight down.   Encourage caloric intake.  No significant nausea.  9. RTC on 04/08/2018 for MD assessment, labs (CBC with diff, CMP, Mg), day 1 of cycle #6 carboplatin + gemcitabine, and +/- Procrit.   [name], MD  03/25/18, 11:05 AM

## 2018-03-25 ENCOUNTER — Other Ambulatory Visit: Payer: Self-pay

## 2018-03-25 ENCOUNTER — Inpatient Hospital Stay (HOSPITAL_BASED_OUTPATIENT_CLINIC_OR_DEPARTMENT_OTHER): Payer: Medicare Other | Admitting: Hematology and Oncology

## 2018-03-25 ENCOUNTER — Inpatient Hospital Stay: Payer: Medicare Other

## 2018-03-25 ENCOUNTER — Encounter: Payer: Self-pay | Admitting: Hematology and Oncology

## 2018-03-25 VITALS — BP 165/80 | HR 56 | Temp 94.4°F | Resp 18 | Wt 170.5 lb

## 2018-03-25 DIAGNOSIS — C642 Malignant neoplasm of left kidney, except renal pelvis: Secondary | ICD-10-CM

## 2018-03-25 DIAGNOSIS — D6481 Anemia due to antineoplastic chemotherapy: Secondary | ICD-10-CM

## 2018-03-25 DIAGNOSIS — I2510 Atherosclerotic heart disease of native coronary artery without angina pectoris: Secondary | ICD-10-CM | POA: Diagnosis not present

## 2018-03-25 DIAGNOSIS — T451X5A Adverse effect of antineoplastic and immunosuppressive drugs, initial encounter: Secondary | ICD-10-CM

## 2018-03-25 DIAGNOSIS — C689 Malignant neoplasm of urinary organ, unspecified: Secondary | ICD-10-CM

## 2018-03-25 DIAGNOSIS — C778 Secondary and unspecified malignant neoplasm of lymph nodes of multiple regions: Secondary | ICD-10-CM

## 2018-03-25 DIAGNOSIS — D696 Thrombocytopenia, unspecified: Secondary | ICD-10-CM

## 2018-03-25 DIAGNOSIS — E1140 Type 2 diabetes mellitus with diabetic neuropathy, unspecified: Secondary | ICD-10-CM

## 2018-03-25 DIAGNOSIS — K5900 Constipation, unspecified: Secondary | ICD-10-CM

## 2018-03-25 DIAGNOSIS — Z7984 Long term (current) use of oral hypoglycemic drugs: Secondary | ICD-10-CM

## 2018-03-25 DIAGNOSIS — E538 Deficiency of other specified B group vitamins: Secondary | ICD-10-CM

## 2018-03-25 DIAGNOSIS — R05 Cough: Secondary | ICD-10-CM

## 2018-03-25 DIAGNOSIS — Z5111 Encounter for antineoplastic chemotherapy: Secondary | ICD-10-CM | POA: Diagnosis not present

## 2018-03-25 LAB — COMPREHENSIVE METABOLIC PANEL
ALT: 17 U/L (ref 0–44)
AST: 22 U/L (ref 15–41)
Albumin: 3.9 g/dL (ref 3.5–5.0)
Alkaline Phosphatase: 70 U/L (ref 38–126)
Anion gap: 5 (ref 5–15)
BUN: 16 mg/dL (ref 8–23)
CO2: 29 mmol/L (ref 22–32)
Calcium: 9.4 mg/dL (ref 8.9–10.3)
Chloride: 104 mmol/L (ref 98–111)
Creatinine, Ser: 0.74 mg/dL (ref 0.61–1.24)
GFR calc Af Amer: >60 mL/min (ref >60)
GFR calc non Af Amer: >60 mL/min (ref >60)
Glucose, Bld: 133 mg/dL — ABNORMAL HIGH (ref 70–99)
Potassium: 4 mmol/L (ref 3.5–5.1)
Sodium: 138 mmol/L (ref 135–145)
Total Bilirubin: 0.9 mg/dL (ref 0.3–1.2)
Total Protein: 6.7 g/dL (ref 6.5–8.1)

## 2018-03-25 LAB — CBC WITH DIFFERENTIAL/PLATELET
Basophils Absolute: 0.1 10*3/uL (ref 0–0.1)
Basophils Relative: 2 %
Eosinophils Absolute: 0.1 10*3/uL (ref 0–0.7)
Eosinophils Relative: 2 %
HCT: 29.3 % — ABNORMAL LOW (ref 40.0–52.0)
Hemoglobin: 9.9 g/dL — ABNORMAL LOW (ref 13.0–18.0)
Lymphocytes Relative: 24 %
Lymphs Abs: 0.8 10*3/uL — ABNORMAL LOW (ref 1.0–3.6)
MCH: 32.4 pg (ref 26.0–34.0)
MCHC: 33.8 g/dL (ref 32.0–36.0)
MCV: 95.8 fL (ref 80.0–100.0)
Monocytes Absolute: 0.3 10*3/uL (ref 0.2–1.0)
Monocytes Relative: 10 %
Neutro Abs: 2.1 10*3/uL (ref 1.4–6.5)
Neutrophils Relative %: 62 %
Platelets: 106 10*3/uL — ABNORMAL LOW (ref 150–440)
RBC: 3.06 MIL/uL — ABNORMAL LOW (ref 4.40–5.90)
RDW: 18.2 % — ABNORMAL HIGH (ref 11.5–14.5)
WBC: 3.4 10*3/uL — ABNORMAL LOW (ref 3.8–10.6)

## 2018-03-25 LAB — MAGNESIUM: Magnesium: 2 mg/dL (ref 1.7–2.4)

## 2018-03-25 MED ORDER — SODIUM CHLORIDE 0.9% FLUSH
10.0000 mL | Freq: Once | INTRAVENOUS | Status: AC
  Administered 2018-03-25: 10 mL via INTRAVENOUS
  Filled 2018-03-25: qty 10

## 2018-03-25 MED ORDER — HEPARIN SOD (PORK) LOCK FLUSH 100 UNIT/ML IV SOLN
500.0000 [IU] | Freq: Once | INTRAVENOUS | Status: AC
  Administered 2018-03-25: 500 [IU] via INTRAVENOUS
  Filled 2018-03-25: qty 5

## 2018-03-25 NOTE — Progress Notes (Signed)
Here for follow up . Per pt overall " feeling great "  Per wife energy level less she has noted.

## 2018-04-07 NOTE — Progress Notes (Signed)
[facility] day:  04/08/2018   Chief Complaint: Patrick Mcgee is a 72 y.o. male with metastatic urothelial carcinoma who is seen for assessment prior to day 1 of cycle #6 carboplatin + gemcitabine.  HPI:  The patient was last seen in the medical oncology clinic on 03/25/2018.  At that time, patient was feeling "great". His energy was "a little low" overall. He experienced some loose stools. Platelets 106,000. Day 8 of cycle #5 gemcitabine was held due to travel plans. Patient going to [facility] from 03/30/2018 - 04/06/2018.  In the interim, patient is doing "wonderful". Patient has no complaints. He enjoyed a recent trip to the beach. Patient denies nausea, vomiting, and changes to his bowel habits. Patient denies that he has experienced any B symptoms. He denies any interval infections. Cough has improved. He is sneezing a lot due to allergies. Bowels are stable. Neuropathy is stable. He is using "Outback" (eucalyptus oil, tea tree oil, vanilla, olive oil) topical on his feet, which helps his pain.   Patient advises that he maintains an adequate appetite. He is eating well. Weight today is 175 lb 1 oz (79.4 kg), which compared to his last visit to the clinic, represents a 5 pound increase. Patient ate "a lot of pancakes" while on vacation. He and his wife ate at restaurants for every meal all week.    Patient denies pain in the clinic today. Patient in good spirits. He states, "I have got to live to see my granddaughter grow up. She is 3. I have got God on my side".    Past Medical History:  Diagnosis Date  . Acid reflux   . Anxiety   . Depression   . Diabetes mellitus without complication ([place])   . Hyperlipidemia   . Hypertension   . Myocardial infarction ([place]) 1995  . Sleep apnea   . Urothelial cancer ([place]) 11/2017   Left Urothelial mass, chemo tx's    Past Surgical History:  Procedure Laterality Date  . CATARACT EXTRACTION Left   .  COLONOSCOPY  2010   [facility]  . COLONOSCOPY WITH PROPOFOL N/A 10/04/2016   Procedure: COLONOSCOPY WITH PROPOFOL;  Surgeon: [name], MD;  Location: [facility] ENDOSCOPY;  Service: Endoscopy;  Laterality: N/A;  . [place], 2000, 2001, 2014  . CYSTOSCOPY W/ RETROGRADES Bilateral 12/10/2017   Procedure: CYSTOSCOPY WITH RETROGRADE PYELOGRAM;  Surgeon: [name], MD;  Location: [facility] ORS;  Service: Urology;  Laterality: Bilateral;  . CYSTOSCOPY WITH STENT PLACEMENT Left 12/10/2017   Procedure: CYSTOSCOPY WITH STENT PLACEMENT;  Surgeon: [name], MD;  Location: [facility] ORS;  Service: Urology;  Laterality: Left;  . INGUINAL HERNIA REPAIR Right 08/09/2015   Procedure: HERNIA REPAIR INGUINAL ADULT;  Surgeon: [name], MD;  Location: [facility] ORS;  Service: General;  Laterality: Right;  . NASAL SINUS SURGERY    . nuclear stress test    . PORTA CATH INSERTION N/A 12/19/2017   Procedure: PORTA CATH INSERTION;  Surgeon: [name], MD;  Location: [facility] CV LAB;  Service: Cardiovascular;  Laterality: N/A;  . URETERAL BIOPSY Left 12/10/2017   Procedure: URETERAL & renal PELVIS BIOPSY;  Surgeon: [name], MD;  Location: [facility] ORS;  Service: Urology;  Laterality: Left;  . URETEROSCOPY Left 12/10/2017   Procedure: URETEROSCOPY;  Surgeon: [name], MD;  Location: [facility] ORS;  Service: Urology;  Laterality: Left;    Family History  Problem  Relation Age of Onset  . Heart disease Mother   . Cancer Father        Lung and colon cancer  . Heart disease Father   . Emphysema Maternal Grandfather   . Tuberculosis Maternal Grandmother     Social History:  reports that he has quit smoking. His smoking use included cigars. He has quit using smokeless tobacco.  His smokeless tobacco use included chew. He reports that he drank alcohol. He reports that he does not use drugs.  Patient is retired from the [employer]. Patient denies known exposures to  radiation on toxins. The patient has a family trip planned to [facility] between 03/30/2018 - 04/06/2018. The patient is accompanied by his wife, Patrick Mcgee, today.  Allergies:  Allergies  Allergen Reactions  . Sulfa Antibiotics Other (See Comments)    Joint pain  . Ace Inhibitors Cough  . Invokana [Canagliflozin] Other (See Comments)    Leg pain  . Penicillins Rash    Has patient had a PCN reaction causing immediate rash, facial/tongue/throat swelling, SOB or lightheadedness with hypotension: Yes Has patient had a PCN reaction causing severe rash involving mucus membranes or skin necrosis: No Has patient had a PCN reaction that required hospitalization: No Has patient had a PCN reaction occurring within the last 10 years: No If all of the above answers are "NO", then may proceed with Cephalosporin use.    Current Medications: Current Outpatient Medications  Medication Sig Dispense Refill  . acetaminophen (TYLENOL) 500 MG tablet Take 500 mg by mouth every 6 (six) hours as needed for moderate pain. When taking oxycodone 5 mg tablets    . atorvastatin (LIPITOR) 40 MG tablet Take 1 tablet (40 mg total) by mouth at bedtime. 90 tablet 3  . carvedilol (COREG) 12.5 MG tablet TAKE ONE TABLET BY MOUTH TWICE DAILY 180 tablet 3  . feeding supplement, ENSURE ENLIVE, (ENSURE ENLIVE) LIQD Take 237 mLs by mouth 3 (three) times daily between meals. 90 Bottle 0  . fluticasone (FLONASE) 50 MCG/ACT nasal spray Place 2 sprays into both nostrils daily. 16 g 12  . glucose blood (CONTOUR NEXT TEST) test strip Checks sugar 4 times daily. DX E11.9-strips for Contour next EZ meter 360 each 3  . Ivermectin (SOOLANTRA) 1 % CREA Apply topically at bedtime. To face    . lidocaine-prilocaine (EMLA) cream Apply 1 application topically as needed. 30 g 3  . losartan (COZAAR) 100 MG tablet TAKE ONE TABLET BY MOUTH ONCE DAILY 90 tablet 3  . meclizine (ANTIVERT) 25 MG tablet Take 25 mg by mouth 3 (three) times daily as  needed for dizziness.    . metFORMIN (GLUCOPHAGE) 1000 MG tablet Take 1 tablet (1,000 mg total) by mouth 2 (two) times daily with a meal. 180 tablet 3  . ALPRAZolam (XANAX) 0.25 MG tablet Take 1 tablet (0.25 mg total) by mouth 3 (three) times daily as needed for anxiety. (Patient not taking: Reported on 03/25/2018) 20 tablet 0  . clobetasol cream (TEMOVATE) 1.61 % Apply 1 application topically as needed (for skin rash). (Patient not taking: Reported on 03/25/2018) 30 g 1  . fentaNYL (DURAGESIC - DOSED MCG/HR) 25 MCG/HR patch Place 1 patch (25 mcg total) onto the skin every 3 (three) days. (Patient not taking: Reported on 03/25/2018) 10 patch 0  . lactulose (CEPHULAC) 20 g packet Take 1 packet (20 g total) by mouth 2 (two) times daily as needed (constipation). (Patient not taking: Reported on 03/25/2018) 60 each 0  .  nystatin cream (MYCOSTATIN) APPLY  CREAM TOPICALLY TWICE DAILY AROUND  THE  GROIN  AREA (Patient not taking: Reported on 03/25/2018) 15 g 1  . ondansetron (ZOFRAN) 8 MG tablet Take 1 tablet (8 mg total) by mouth every 8 (eight) hours as needed for nausea or vomiting (if needed beginning 3 days after chemotherapy). (Patient not taking: Reported on 03/25/2018) 20 tablet 0   No current facility-administered medications for this visit.    Facility-Administered Medications Ordered in Other Visits  Medication Dose Route Frequency Provider Last Rate Last Dose  . heparin lock flush 100 unit/mL  500 Units Intravenous Once Corcoran, Melissa C, MD      . sodium chloride flush (NS) 0.9 % injection 10 mL  10 mL Intravenous PRN [name], MD   10 mL at 04/08/18 9622    Review of Systems  Constitutional: Negative for diaphoresis, fever, malaise/fatigue and weight loss (up 5 pounds).       "I feel wonderful".   HENT: Negative.        Sinus symptoms; "Sneezy".  Eyes: Negative.   Respiratory: Positive for cough (chronic, mild). Negative for hemoptysis, sputum production and shortness of breath.    Cardiovascular: Negative for chest pain, palpitations, orthopnea, leg swelling and PND.       MI s/p angioplasty and placement of 5-6 stents (last in 2015). On 3 antihypertensives. (+) OSAH and uses nocturnal PAP therapy.  Gastrointestinal: Positive for heartburn (on PPI). Negative for abdominal pain, blood in stool, constipation, diarrhea, melena, nausea and vomiting.  Genitourinary: Negative for dysuria, frequency, hematuria and urgency.  Musculoskeletal: Negative for back pain, falls, joint pain and myalgias.  Skin: Negative for itching and rash.       History of intertriginous rash (sub-pannus); uses Nystatin PRN.  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 Negative for dizziness, tremors, weakness and headaches.  Endo/Heme/Allergies: Positive for environmental allergies (seasonal allergies). Does not bruise/bleed easily.       PMH (+) for diabetes   Psychiatric/Behavioral: Negative for depression, memory loss and suicidal ideas. The patient is not nervous/anxious and does not have insomnia.   All other systems reviewed and are negative.  Performance status (ECOG): 2 - Symptomatic, <50% confined to bed  Vital signs BP 131/86 (BP Location: Left Arm, Patient Position: Sitting)   Pulse (!) 57   Temp (!) 95.7 F (35.4 C) (Tympanic)   Resp 18   Wt 175 lb 1 oz (79.4 kg)   BMI 26.62 kg/m   Physical Exam  Constitutional: He is oriented to person, place, and time and well-developed, well-nourished, and in no distress.  HENT:  Head: Normocephalic and atraumatic.  Gray hair  Eyes: Pupils are equal, round, and reactive to light. EOM are normal. No scleral icterus.  Brown eyes.   Neck: Normal range of motion. Neck supple. No tracheal deviation present. No thyromegaly present.  Cardiovascular: Normal rate, regular rhythm and normal heart sounds. Exam reveals no gallop and no friction rub.  No murmur heard. Pulmonary/Chest: Effort normal and breath sounds  normal. No respiratory distress. He has no wheezes. He has no rales.  Abdominal: Soft. Bowel sounds are normal. He exhibits no distension. There is no tenderness.  Musculoskeletal: Normal range of motion. He exhibits no edema or tenderness.  Lymphadenopathy:    He has no cervical adenopathy.    He has no axillary adenopathy.       Right: No inguinal and no supraclavicular adenopathy present.  Left: No inguinal and no supraclavicular adenopathy present.  Neurological: He is alert and oriented to person, place, and time.  Skin: Skin is warm and dry. No rash noted. No erythema.  Psychiatric: Mood, affect and judgment normal.  Nursing note and vitals reviewed.   Infusion on 04/08/2018  Component Date Value Ref Range Status  . Magnesium 04/08/2018 1.8  1.7 - 2.4 mg/dL Final   Performed at [facility], [address], [place], [place] [address]  . Sodium 04/08/2018 139  135 - 145 mmol/L Final  . Potassium 04/08/2018 4.1  3.5 - 5.1 mmol/L Final  . Chloride 04/08/2018 107  98 - 111 mmol/L Final  . CO2 04/08/2018 27  22 - 32 mmol/L Final  . Glucose, Bld 04/08/2018 200* 70 - 99 mg/dL Final  . BUN 04/08/2018 10  8 - 23 mg/dL Final  . Creatinine, Ser 04/08/2018 0.56* 0.61 - 1.24 mg/dL Final  . Calcium 04/08/2018 9.3  8.9 - 10.3 mg/dL Final  . Total Protein 04/08/2018 6.7  6.5 - 8.1 g/dL Final  . Albumin 04/08/2018 3.6  3.5 - 5.0 g/dL Final  . AST 04/08/2018 22  15 - 41 U/L Final  . ALT 04/08/2018 17  0 - 44 U/L Final  . Alkaline Phosphatase 04/08/2018 78  38 - 126 U/L Final  . Total Bilirubin 04/08/2018 0.9  0.3 - 1.2 mg/dL Final  . GFR calc non Af Amer 04/08/2018 >60  >60 mL/min Final  . GFR calc Af Amer 04/08/2018 >60  >60 mL/min Final   Comment: (NOTE) The eGFR has been calculated using the CKD EPI equation. This calculation has not been validated in all clinical situations. eGFR's persistently <60 mL/min signify possible Chronic Kidney Disease.   Georgiann Hahn gap 04/08/2018 5   5 - 15 Final   Performed at [facility], [address] [place], [place] [address]  . WBC 04/08/2018 3.5* 3.8 - 10.6 K/uL Final  . RBC 04/08/2018 3.16* 4.40 - 5.90 MIL/uL Final  . Hemoglobin 04/08/2018 10.4* 13.0 - 18.0 g/dL Final  . HCT 04/08/2018 30.3* 40.0 - 52.0 % Final  . MCV 04/08/2018 95.9  80.0 - 100.0 fL Final  . MCH 04/08/2018 32.9  26.0 - 34.0 pg Final  . MCHC 04/08/2018 34.3  32.0 - 36.0 g/dL Final  . RDW 04/08/2018 17.9* 11.5 - 14.5 % Final  . Platelets 04/08/2018 221  150 - 440 K/uL Final  . Neutrophils Relative % 04/08/2018 52  % Final  . Neutro Abs 04/08/2018 1.8  1.4 - 6.5 K/uL Final  . Lymphocytes Relative 04/08/2018 25  % Final  . Lymphs Abs 04/08/2018 0.9* 1.0 - 3.6 K/uL Final  . Monocytes Relative 04/08/2018 17  % Final  . Monocytes Absolute 04/08/2018 0.6  0.2 - 1.0 K/uL Final  . Eosinophils Relative 04/08/2018 5  % Final  . Eosinophils Absolute 04/08/2018 0.2  0 - 0.7 K/uL Final  . Basophils Relative 04/08/2018 1  % Final  . Basophils Absolute 04/08/2018 0.0  0 - 0.1 K/uL Final   Performed at [facility], [address] [place], [place] [address]    Assessment:  Patrick Mcgee is a 72 y.o. 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  He presented with abdominal pain and weight loss.  Abdomen and pelvic CT on 12/01/2017 revealed a suspicious enhancing, filling defect involving the lower pole collecting system of the left kidney with asymmetric hypoenhancement of the inferior  pole cortex of left kidney noted. Findings were worrisome for urothelial neoplasm.  There was abdominal and pelvic adenopathy compatible with nodal metastasis.  There was a 2.6 cm right para celiac node, 1,6 cm left retroperitoneal node, 1.4 cm right retroperitoneal node, 2.1 cm right common iliac node and 1 cm left external iliac node.  There was aortic atherosclerosis, kidney cysts, and prostate gland enlargement.  Chest CT on  12/07/2017 revealed no evidence of metastatic disease.    He underwent left ureteroscopy with renal pelvis biopsy and left ureteral stent placement on 12/10/2017.  Findings included a large nodular, vascular tumor within the left lower pole collecting system completely obstructing the entire left lower pole moiety.  Ureter, right kidney, and bladder was normal.  Pathology revealed small fragments of high-grade urothelial carcinoma with a small focus of invasion.  He is s/p day 1 of cycle #5 carboplatin + gemcitabine (12/21/2017 - 03/18/2018). Day 8 of cycle #5 held due to low platelets and upcoming travel plans. He is tolerating treatment well.   Chest, abdomen, and pelvic CT on 02/22/2018 revealed marked improvement in nodal metastatic disease in the abdomen/pelvis. There continued to be enhancing material similar to prior in the lower pole collecting system on the left, with hypoenhancement of the subtended parenchyma in the lower third of the left kidney suggesting poor urinary drainage from underlying tumor.  There was no new metastatic lesions are identified.   He has B12 deficiency.  B12 was 247 on 12/31/2017.  He began oral B12 on 01/07/2018.  B12 was 983 on 02/11/2018.  He has diabetes and coronary artery disease s/p MI in 1995.  He is s/p stent placement x 5-6.  Colonoscopy in 2018 was negative.  He has chemotherapy induced anemia requiring Procrit injections every 2 weeks to support his counts (last 03/18/2018).   Symptomatically, he is doing "wonderful". He has no acute complaints. Bowels and neuropathy is stable. Exam is stable. Platelets are 221,000.  Plan: 1. Labs today: CBC with diff, CMP, Mg 2. Metastatic urothelial carcinoma Doing well overall. Tolerating treatments with minimal side effects. Labs reviewed. Blood counts stable and adequate enough for treatment. Will proceed with day 1 of cycle #6 carboplatin + gemcitabine. Discuss symptom management.  Patient has antiemetics and  pain medications at home to use on a PRN basis. Patient  advising that the  prescribed interventions are adequate at this point. Continue all medications as previously prescribed.  3. Chemotherapy induced anemia  Hemoglobin 10.4 and hematocrit 30.3.  Hemoglobin goal < 10. No Procrit today.   Continue labs (CBC) and +/- Procrit every 2 weeks.  4. Constipation  Bowels managed on current regimen.  Patient using as needed interventions to control. 5. Cough  Chronic.  Patient has an interstitial lung disease diagnosis.  High-resolution chest CT scheduled for 05/31/2018.  Scheduled follow-up with pulmonary medicine Ashby Dawes, MD) in 3 months. 6. Hypomagnesemia  Magnesium stable at 1.8  Continue Mag-Ox 400 mg every other day. 7. B12 deficiency  B12 level improving on oral supplementation.  Continue oral B12 1000 mcg daily. 8. Nutrition  His weight today is 175 lb 1 oz (79.4 kg). His BMI of 26.62 kg/m.   Encouraged him to increase his intake of calorie and protein dense food choices.  9. RTC in 1 week for MD assessment, labs (CBC with differential, CMP, magnesium), and day 8 of cycle #6 gemcitabine. 10. RTC every 2 weeks for labs (CBC) and +/- Procrit injection.  11. RTC on 04/29/2018 for  MD assessment, labs (CBC with differential, CMP, magnesium), and day 1 of cycle #7 carboplatin + gemcitabine. 12. RTC on 05/06/2018  for MD assessment, labs (CBC with differential, CMP, magnesium), and day 8 of cycle #7 gemcitabine and +/- Procrit injection.    Honor Loh, NP  04/08/18, 10:26 AM   I saw and evaluated the patient, participating in the key portions of the service and reviewing pertinent diagnostic studies and records.  I reviewed the nurse practitioner's note and agree with the findings and the plan.  The assessment and plan were discussed with the patient.  Several questions were asked by the patient and answered.   Nolon Stalls, MD 04/08/2018,10:26 AM

## 2018-04-08 ENCOUNTER — Inpatient Hospital Stay: Payer: Medicare Other

## 2018-04-08 ENCOUNTER — Encounter: Payer: Self-pay | Admitting: Hematology and Oncology

## 2018-04-08 ENCOUNTER — Inpatient Hospital Stay (HOSPITAL_BASED_OUTPATIENT_CLINIC_OR_DEPARTMENT_OTHER): Payer: Medicare Other | Admitting: Hematology and Oncology

## 2018-04-08 VITALS — BP 131/86 | HR 57 | Temp 95.7°F | Resp 18 | Wt 175.1 lb

## 2018-04-08 DIAGNOSIS — Z7984 Long term (current) use of oral hypoglycemic drugs: Secondary | ICD-10-CM

## 2018-04-08 DIAGNOSIS — Z5111 Encounter for antineoplastic chemotherapy: Secondary | ICD-10-CM | POA: Diagnosis not present

## 2018-04-08 DIAGNOSIS — D701 Agranulocytosis secondary to cancer chemotherapy: Secondary | ICD-10-CM

## 2018-04-08 DIAGNOSIS — I2510 Atherosclerotic heart disease of native coronary artery without angina pectoris: Secondary | ICD-10-CM | POA: Diagnosis not present

## 2018-04-08 DIAGNOSIS — E1140 Type 2 diabetes mellitus with diabetic neuropathy, unspecified: Secondary | ICD-10-CM

## 2018-04-08 DIAGNOSIS — C778 Secondary and unspecified malignant neoplasm of lymph nodes of multiple regions: Secondary | ICD-10-CM

## 2018-04-08 DIAGNOSIS — J849 Interstitial pulmonary disease, unspecified: Secondary | ICD-10-CM

## 2018-04-08 DIAGNOSIS — E538 Deficiency of other specified B group vitamins: Secondary | ICD-10-CM | POA: Diagnosis not present

## 2018-04-08 DIAGNOSIS — T451X5A Adverse effect of antineoplastic and immunosuppressive drugs, initial encounter: Secondary | ICD-10-CM

## 2018-04-08 DIAGNOSIS — R05 Cough: Secondary | ICD-10-CM

## 2018-04-08 DIAGNOSIS — C642 Malignant neoplasm of left kidney, except renal pelvis: Secondary | ICD-10-CM

## 2018-04-08 DIAGNOSIS — D6481 Anemia due to antineoplastic chemotherapy: Secondary | ICD-10-CM

## 2018-04-08 LAB — CBC WITH DIFFERENTIAL/PLATELET
Basophils Absolute: 0 10*3/uL (ref 0–0.1)
Basophils Relative: 1 %
Eosinophils Absolute: 0.2 10*3/uL (ref 0–0.7)
Eosinophils Relative: 5 %
HCT: 30.3 % — ABNORMAL LOW (ref 40.0–52.0)
Hemoglobin: 10.4 g/dL — ABNORMAL LOW (ref 13.0–18.0)
Lymphocytes Relative: 25 %
Lymphs Abs: 0.9 10*3/uL — ABNORMAL LOW (ref 1.0–3.6)
MCH: 32.9 pg (ref 26.0–34.0)
MCHC: 34.3 g/dL (ref 32.0–36.0)
MCV: 95.9 fL (ref 80.0–100.0)
Monocytes Absolute: 0.6 10*3/uL (ref 0.2–1.0)
Monocytes Relative: 17 %
Neutro Abs: 1.8 10*3/uL (ref 1.4–6.5)
Neutrophils Relative %: 52 %
Platelets: 221 10*3/uL (ref 150–440)
RBC: 3.16 MIL/uL — ABNORMAL LOW (ref 4.40–5.90)
RDW: 17.9 % — ABNORMAL HIGH (ref 11.5–14.5)
WBC: 3.5 10*3/uL — ABNORMAL LOW (ref 3.8–10.6)

## 2018-04-08 LAB — COMPREHENSIVE METABOLIC PANEL
ALT: 17 U/L (ref 0–44)
AST: 22 U/L (ref 15–41)
Albumin: 3.6 g/dL (ref 3.5–5.0)
Alkaline Phosphatase: 78 U/L (ref 38–126)
Anion gap: 5 (ref 5–15)
BUN: 10 mg/dL (ref 8–23)
CO2: 27 mmol/L (ref 22–32)
Calcium: 9.3 mg/dL (ref 8.9–10.3)
Chloride: 107 mmol/L (ref 98–111)
Creatinine, Ser: 0.56 mg/dL — ABNORMAL LOW (ref 0.61–1.24)
GFR calc Af Amer: >60 mL/min (ref >60)
GFR calc non Af Amer: >60 mL/min (ref >60)
Glucose, Bld: 200 mg/dL — ABNORMAL HIGH (ref 70–99)
Potassium: 4.1 mmol/L (ref 3.5–5.1)
Sodium: 139 mmol/L (ref 135–145)
Total Bilirubin: 0.9 mg/dL (ref 0.3–1.2)
Total Protein: 6.7 g/dL (ref 6.5–8.1)

## 2018-04-08 LAB — MAGNESIUM: Magnesium: 1.8 mg/dL (ref 1.7–2.4)

## 2018-04-08 MED ORDER — SODIUM CHLORIDE 0.9% FLUSH
10.0000 mL | INTRAVENOUS | Status: DC | PRN
  Administered 2018-04-08: 10 mL via INTRAVENOUS
  Filled 2018-04-08: qty 10

## 2018-04-08 MED ORDER — PALONOSETRON HCL INJECTION 0.25 MG/5ML
0.2500 mg | Freq: Once | INTRAVENOUS | Status: AC
  Administered 2018-04-08: 0.25 mg via INTRAVENOUS
  Filled 2018-04-08: qty 5

## 2018-04-08 MED ORDER — HEPARIN SOD (PORK) LOCK FLUSH 100 UNIT/ML IV SOLN
500.0000 [IU] | Freq: Once | INTRAVENOUS | Status: AC
  Administered 2018-04-08: 500 [IU] via INTRAVENOUS

## 2018-04-08 MED ORDER — SODIUM CHLORIDE 0.9 % IV SOLN
370.4000 mg | Freq: Once | INTRAVENOUS | Status: AC
  Administered 2018-04-08: 370 mg via INTRAVENOUS
  Filled 2018-04-08: qty 37

## 2018-04-08 MED ORDER — DEXAMETHASONE SODIUM PHOSPHATE 10 MG/ML IJ SOLN
10.0000 mg | Freq: Once | INTRAMUSCULAR | Status: AC
  Administered 2018-04-08: 10 mg via INTRAVENOUS
  Filled 2018-04-08: qty 1

## 2018-04-08 MED ORDER — HEPARIN SOD (PORK) LOCK FLUSH 100 UNIT/ML IV SOLN
500.0000 [IU] | Freq: Once | INTRAVENOUS | Status: DC | PRN
  Filled 2018-04-08: qty 5

## 2018-04-08 MED ORDER — SODIUM CHLORIDE 0.9 % IV SOLN
Freq: Once | INTRAVENOUS | Status: AC
  Administered 2018-04-08: 11:00:00 via INTRAVENOUS
  Filled 2018-04-08: qty 250

## 2018-04-08 MED ORDER — SODIUM CHLORIDE 0.9 % IV SOLN
1600.0000 mg | Freq: Once | INTRAVENOUS | Status: AC
  Administered 2018-04-08: 1600 mg via INTRAVENOUS
  Filled 2018-04-08: qty 26.3

## 2018-04-08 NOTE — Progress Notes (Signed)
Patient offers no complaints today. 

## 2018-04-15 ENCOUNTER — Inpatient Hospital Stay: Payer: Medicare Other

## 2018-04-15 ENCOUNTER — Inpatient Hospital Stay (HOSPITAL_BASED_OUTPATIENT_CLINIC_OR_DEPARTMENT_OTHER): Payer: Medicare Other | Admitting: Hematology and Oncology

## 2018-04-15 ENCOUNTER — Inpatient Hospital Stay: Payer: Medicare Other | Attending: Hematology and Oncology

## 2018-04-15 ENCOUNTER — Other Ambulatory Visit: Payer: Self-pay

## 2018-04-15 ENCOUNTER — Encounter: Payer: Self-pay | Admitting: Hematology and Oncology

## 2018-04-15 VITALS — BP 138/81 | HR 57 | Temp 94.3°F | Resp 18 | Wt 174.2 lb

## 2018-04-15 DIAGNOSIS — E538 Deficiency of other specified B group vitamins: Secondary | ICD-10-CM | POA: Diagnosis not present

## 2018-04-15 DIAGNOSIS — Z23 Encounter for immunization: Secondary | ICD-10-CM | POA: Diagnosis not present

## 2018-04-15 DIAGNOSIS — T451X5A Adverse effect of antineoplastic and immunosuppressive drugs, initial encounter: Secondary | ICD-10-CM

## 2018-04-15 DIAGNOSIS — M791 Myalgia, unspecified site: Secondary | ICD-10-CM | POA: Diagnosis not present

## 2018-04-15 DIAGNOSIS — C642 Malignant neoplasm of left kidney, except renal pelvis: Secondary | ICD-10-CM

## 2018-04-15 DIAGNOSIS — I2510 Atherosclerotic heart disease of native coronary artery without angina pectoris: Secondary | ICD-10-CM | POA: Insufficient documentation

## 2018-04-15 DIAGNOSIS — Z5111 Encounter for antineoplastic chemotherapy: Secondary | ICD-10-CM | POA: Diagnosis not present

## 2018-04-15 DIAGNOSIS — Z87891 Personal history of nicotine dependence: Secondary | ICD-10-CM | POA: Diagnosis not present

## 2018-04-15 DIAGNOSIS — C778 Secondary and unspecified malignant neoplasm of lymph nodes of multiple regions: Secondary | ICD-10-CM

## 2018-04-15 DIAGNOSIS — E1140 Type 2 diabetes mellitus with diabetic neuropathy, unspecified: Secondary | ICD-10-CM | POA: Insufficient documentation

## 2018-04-15 DIAGNOSIS — Z7984 Long term (current) use of oral hypoglycemic drugs: Secondary | ICD-10-CM | POA: Diagnosis not present

## 2018-04-15 DIAGNOSIS — D6481 Anemia due to antineoplastic chemotherapy: Secondary | ICD-10-CM | POA: Insufficient documentation

## 2018-04-15 DIAGNOSIS — D701 Agranulocytosis secondary to cancer chemotherapy: Secondary | ICD-10-CM

## 2018-04-15 DIAGNOSIS — G629 Polyneuropathy, unspecified: Secondary | ICD-10-CM | POA: Diagnosis not present

## 2018-04-15 LAB — CBC WITH DIFFERENTIAL/PLATELET
Basophils Absolute: 0 10*3/uL (ref 0–0.1)
Basophils Relative: 1 %
Eosinophils Absolute: 0.1 10*3/uL (ref 0–0.7)
Eosinophils Relative: 2 %
HCT: 30.2 % — ABNORMAL LOW (ref 40.0–52.0)
Hemoglobin: 10.3 g/dL — ABNORMAL LOW (ref 13.0–18.0)
Lymphocytes Relative: 21 %
Lymphs Abs: 0.7 10*3/uL — ABNORMAL LOW (ref 1.0–3.6)
MCH: 32.8 pg (ref 26.0–34.0)
MCHC: 34.1 g/dL (ref 32.0–36.0)
MCV: 96.2 fL (ref 80.0–100.0)
Monocytes Absolute: 0.2 10*3/uL (ref 0.2–1.0)
Monocytes Relative: 8 %
Neutro Abs: 2.2 10*3/uL (ref 1.4–6.5)
Neutrophils Relative %: 68 %
Platelets: 135 10*3/uL — ABNORMAL LOW (ref 150–440)
RBC: 3.14 MIL/uL — ABNORMAL LOW (ref 4.40–5.90)
RDW: 16 % — ABNORMAL HIGH (ref 11.5–14.5)
WBC: 3.2 10*3/uL — ABNORMAL LOW (ref 3.8–10.6)

## 2018-04-15 LAB — COMPREHENSIVE METABOLIC PANEL
ALT: 23 U/L (ref 0–44)
AST: 28 U/L (ref 15–41)
Albumin: 3.7 g/dL (ref 3.5–5.0)
Alkaline Phosphatase: 75 U/L (ref 38–126)
Anion gap: 6 (ref 5–15)
BUN: 15 mg/dL (ref 8–23)
CO2: 26 mmol/L (ref 22–32)
Calcium: 9.4 mg/dL (ref 8.9–10.3)
Chloride: 103 mmol/L (ref 98–111)
Creatinine, Ser: 0.69 mg/dL (ref 0.61–1.24)
GFR calc Af Amer: >60 mL/min (ref >60)
GFR calc non Af Amer: >60 mL/min (ref >60)
Glucose, Bld: 252 mg/dL — ABNORMAL HIGH (ref 70–99)
Potassium: 4.3 mmol/L (ref 3.5–5.1)
Sodium: 135 mmol/L (ref 135–145)
Total Bilirubin: 0.8 mg/dL (ref 0.3–1.2)
Total Protein: 6.6 g/dL (ref 6.5–8.1)

## 2018-04-15 LAB — MAGNESIUM: Magnesium: 1.8 mg/dL (ref 1.7–2.4)

## 2018-04-15 MED ORDER — HEPARIN SOD (PORK) LOCK FLUSH 100 UNIT/ML IV SOLN
500.0000 [IU] | Freq: Once | INTRAVENOUS | Status: AC | PRN
  Administered 2018-04-15: 500 [IU]
  Filled 2018-04-15: qty 5

## 2018-04-15 MED ORDER — ONDANSETRON HCL 4 MG/2ML IJ SOLN
8.0000 mg | Freq: Once | INTRAMUSCULAR | Status: AC
  Administered 2018-04-15: 8 mg via INTRAVENOUS
  Filled 2018-04-15: qty 4

## 2018-04-15 MED ORDER — SODIUM CHLORIDE 0.9 % IV SOLN
1600.0000 mg | Freq: Once | INTRAVENOUS | Status: AC
  Administered 2018-04-15: 1600 mg via INTRAVENOUS
  Filled 2018-04-15: qty 26.3

## 2018-04-15 MED ORDER — SODIUM CHLORIDE 0.9% FLUSH
10.0000 mL | INTRAVENOUS | Status: DC | PRN
  Administered 2018-04-15: 10 mL
  Filled 2018-04-15: qty 10

## 2018-04-15 MED ORDER — SODIUM CHLORIDE 0.9 % IV SOLN
Freq: Once | INTRAVENOUS | Status: AC
  Administered 2018-04-15: 13:00:00 via INTRAVENOUS
  Filled 2018-04-15: qty 250

## 2018-04-15 MED ORDER — SODIUM CHLORIDE 0.9 % IV SOLN: Freq: Once | INTRAVENOUS | Status: DC

## 2018-04-15 MED ORDER — DEXAMETHASONE SODIUM PHOSPHATE 10 MG/ML IJ SOLN
10.0000 mg | Freq: Once | INTRAMUSCULAR | Status: AC
  Administered 2018-04-15: 10 mg via INTRAVENOUS
  Filled 2018-04-15: qty 1

## 2018-04-15 NOTE — Progress Notes (Signed)
[facility] day:  04/15/2018   Chief Complaint: Patrick Mcgee is a 72 y.o. male with metastatic urothelial carcinoma who is seen for assessment prior to day 8 of cycle #6 carboplatin + gemcitabine.  HPI:  The patient was last seen in the medical oncology clinic on 04/08/2018.  At that time, patient was doing "wonderful". Recently back from a beach trip. No acute symptoms. Neuropathy stable with "Outback" topical (ecyalyptus oil, tea tree oil, vanilla, and olive oil). Appetite stable; weight up 5 pounds. Exam was stable. Platelets 221,000. He received day 1 of cycle carboplatin + gemcitabine.   In the interim, patient is doing "ok". Patient notes a bad week last week. Patient with excessive fatigue. He states, "I did not even get out of bed on Wednesday". Patient describes muscle weakness in his arms and legs. He has myalgias. He is making efforts to remain as active as possible. Patient denies that he has experienced any B symptoms. He denies any interval infections. He denies nausea and vomiting. Neuropathy is stable overall.   Patient advises that he maintains an adequate appetite. He is eating well. Weight today is 174 lb 3.2 oz (79 kg), which compared to his last visit to the clinic, represents a 1 pound decrease.    Patient denies pain in the clinic today.   Past Medical History:  Diagnosis Date  . Acid reflux   . Anxiety   . Depression   . Diabetes mellitus without complication ([place])   . Hyperlipidemia   . Hypertension   . Myocardial infarction ([place]) 1995  . Sleep apnea   . Urothelial cancer ([place]) 11/2017   Left Urothelial mass, chemo tx's    Past Surgical History:  Procedure Laterality Date  . CATARACT EXTRACTION Left   . COLONOSCOPY  2010   [facility]  . COLONOSCOPY WITH PROPOFOL N/A 10/04/2016   Procedure: COLONOSCOPY WITH PROPOFOL;  Surgeon: [name], MD;  Location: [facility] ENDOSCOPY;  Service: Endoscopy;  Laterality: N/A;  .  [place], 2000, 2001, 2014  . CYSTOSCOPY W/ RETROGRADES Bilateral 12/10/2017   Procedure: CYSTOSCOPY WITH RETROGRADE PYELOGRAM;  Surgeon: [name], MD;  Location: [facility] ORS;  Service: Urology;  Laterality: Bilateral;  . CYSTOSCOPY WITH STENT PLACEMENT Left 12/10/2017   Procedure: CYSTOSCOPY WITH STENT PLACEMENT;  Surgeon: [name], MD;  Location: [facility] ORS;  Service: Urology;  Laterality: Left;  . INGUINAL HERNIA REPAIR Right 08/09/2015   Procedure: HERNIA REPAIR INGUINAL ADULT;  Surgeon: [name], MD;  Location: [facility] ORS;  Service: General;  Laterality: Right;  . NASAL SINUS SURGERY    . nuclear stress test    . PORTA CATH INSERTION N/A 12/19/2017   Procedure: PORTA CATH INSERTION;  Surgeon: [name], MD;  Location: [facility] LAB;  Service: Cardiovascular;  Laterality: N/A;  . URETERAL BIOPSY Left 12/10/2017   Procedure: URETERAL & renal PELVIS BIOPSY;  Surgeon: [name], MD;  Location: [facility] ORS;  Service: Urology;  Laterality: Left;  . URETEROSCOPY Left 12/10/2017   Procedure: URETEROSCOPY;  Surgeon: [name], MD;  Location: [facility] ORS;  Service: Urology;  Laterality: Left;    Family History  Problem Relation Age of Onset  . Heart disease Mother   . Cancer Father        Lung and colon cancer  . Heart disease Father   . Emphysema Maternal Grandfather   . Tuberculosis Maternal Grandmother  Social History:  reports that he has quit smoking. His smoking use included cigars. He has quit using smokeless tobacco.  His smokeless tobacco use included chew. He reports that he drank alcohol. He reports that he does not use drugs.  Patient is retired from the [employer]. Patient denies known exposures to radiation on toxins. The patient has a family trip planned to [facility] between 03/30/2018 - 04/06/2018. The patient is accompanied by his wife, [name], today.  Allergies:  Allergies  Allergen Reactions  .  Sulfa Antibiotics Other (See Comments)    Joint pain  . Ace Inhibitors Cough  . Invokana [Canagliflozin] Other (See Comments)    Leg pain  . Penicillins Rash    Has patient had a PCN reaction causing immediate rash, facial/tongue/throat swelling, SOB or lightheadedness with hypotension: Yes Has patient had a PCN reaction causing severe rash involving mucus membranes or skin necrosis: No Has patient had a PCN reaction that required hospitalization: No Has patient had a PCN reaction occurring within the last 10 years: No If all of the above answers are "NO", then may proceed with Cephalosporin use.    Current Medications: Current Outpatient Medications  Medication Sig Dispense Refill  . atorvastatin (LIPITOR) 40 MG tablet Take 1 tablet (40 mg total) by mouth at bedtime. 90 tablet 3  . carvedilol (COREG) 12.5 MG tablet TAKE ONE TABLET BY MOUTH TWICE DAILY 180 tablet 3  . clobetasol cream (TEMOVATE) 8.24 % Apply 1 application topically as needed (for skin rash). 30 g 1  . feeding supplement, ENSURE ENLIVE, (ENSURE ENLIVE) LIQD Take 237 mLs by mouth 3 (three) times daily between meals. 90 Bottle 0  . fluticasone (FLONASE) 50 MCG/ACT nasal spray Place 2 sprays into both nostrils daily. 16 g 12  . glucose blood (CONTOUR NEXT TEST) test strip Checks sugar 4 times daily. DX E11.9-strips for Contour next EZ meter 360 each 3  . lactulose (CEPHULAC) 20 g packet Take 1 packet (20 g total) by mouth 2 (two) times daily as needed (constipation). 60 each 0  . losartan (COZAAR) 100 MG tablet TAKE ONE TABLET BY MOUTH ONCE DAILY 90 tablet 3  . metFORMIN (GLUCOPHAGE) 1000 MG tablet Take 1 tablet (1,000 mg total) by mouth 2 (two) times daily with a meal. 180 tablet 3  . nystatin cream (MYCOSTATIN) APPLY  CREAM TOPICALLY TWICE DAILY AROUND  THE  GROIN  AREA 15 g 1  . acetaminophen (TYLENOL) 500 MG tablet Take 500 mg by mouth every 6 (six) hours as needed for moderate pain. When taking oxycodone 5 mg tablets    .  ALPRAZolam (XANAX) 0.25 MG tablet Take 1 tablet (0.25 mg total) by mouth 3 (three) times daily as needed for anxiety. (Patient not taking: Reported on 04/15/2018) 20 tablet 0  . fentaNYL (DURAGESIC - DOSED MCG/HR) 25 MCG/HR patch Place 1 patch (25 mcg total) onto the skin every 3 (three) days. (Patient not taking: Reported on 04/15/2018) 10 patch 0  . Ivermectin (SOOLANTRA) 1 % CREA Apply topically at bedtime. To face    . lidocaine-prilocaine (EMLA) cream Apply 1 application topically as needed. (Patient not taking: Reported on 04/15/2018) 30 g 3  . meclizine (ANTIVERT) 25 MG tablet Take 25 mg by mouth 3 (three) times daily as needed for dizziness.    . ondansetron (ZOFRAN) 8 MG tablet Take 1 tablet (8 mg total) by mouth every 8 (eight) hours as needed for nausea or vomiting (if needed beginning 3 days after chemotherapy). (Patient  not taking: Reported on 04/15/2018) 20 tablet 0   No current facility-administered medications for this visit.     Review of Systems  Constitutional: Positive for malaise/fatigue and weight loss (down 1 pound). Negative for diaphoresis and fever.       "I am doing ok". Not as active.   HENT: Negative.   Eyes: Negative.   Respiratory: Positive for cough (chronic; mild). Negative for hemoptysis, sputum production and shortness of breath.   Cardiovascular: Negative for chest pain, palpitations, orthopnea, leg swelling and PND.       MI s/p angioplasty and placement of 5-6 stents (last in 2015). On 3 antihypertensives. (+) OSAH and uses nocturnal PAP therapy.   Gastrointestinal: Positive for heartburn (on PPI therapy). Negative for abdominal pain, blood in stool, constipation, diarrhea, melena, nausea and vomiting.  Genitourinary: Negative for dysuria, frequency, hematuria and urgency.  Musculoskeletal: Positive for myalgias (?? related to gemcitabine). Negative for back pain, falls and joint pain.  Skin: Negative for itching and rash.  Neurological: Positive for sensory  change (DIABETIC neuropathy in feet) and weakness (generalized). Negative for dizziness, tremors and headaches.  Endo/Heme/Allergies: Positive for environmental allergies (seasonal allergies). Does not bruise/bleed easily.       PMH (+) for diabetes.  Psychiatric/Behavioral: Negative for depression, memory loss and suicidal ideas. The patient is not nervous/anxious and does not have insomnia.   All other systems reviewed and are negative.  Performance status (ECOG): 2 - Symptomatic, <50% confined to bed  Vital signs BP 138/81 (BP Location: Left Arm, Patient Position: Sitting)   Pulse (!) 57   Temp (!) 94.3 F (34.6 C) (Tympanic)   Resp 18   Wt 174 lb 3.2 oz (79 kg)   BMI 26.49 kg/m   Physical Exam  Constitutional: He is oriented to person, place, and time and well-developed, well-nourished, and in no distress.  HENT:  Head: Normocephalic and atraumatic.  Grey hair  Eyes: Pupils are equal, round, and reactive to light. EOM are normal. No scleral icterus.  Brown eyes  Neck: Normal range of motion. Neck supple. No tracheal deviation present. No thyromegaly present.  Cardiovascular: Normal rate, regular rhythm and normal heart sounds. Exam reveals no gallop and no friction rub.  No murmur heard. Pulmonary/Chest: Effort normal and breath sounds normal. No respiratory distress. He has no wheezes. He has no rales.  Abdominal: Soft. Bowel sounds are normal. He exhibits no distension. There is no tenderness.  Musculoskeletal: Normal range of motion. He exhibits no edema or tenderness.  Lymphadenopathy:    He has no cervical adenopathy.    He has no axillary adenopathy.       Right: No inguinal and no supraclavicular adenopathy present.       Left: No inguinal and no supraclavicular adenopathy present.  Neurological: He is alert and oriented to person, place, and time.  Skin: Skin is warm and dry. No rash noted. No erythema.  Psychiatric: Mood, affect and judgment normal.  Nursing note  and vitals reviewed.   Appointment on 04/15/2018  Component Date Value Ref Range Status  . Magnesium 04/15/2018 1.8  1.7 - 2.4 mg/dL Final   Performed at [facility], [address] [place], [place] [address]  . Sodium 04/15/2018 135  135 - 145 mmol/L Final  . Potassium 04/15/2018 4.3  3.5 - 5.1 mmol/L Final  . Chloride 04/15/2018 103  98 - 111 mmol/L Final  . CO2 04/15/2018 26  22 - 32 mmol/L Final  . Glucose, Bld  04/15/2018 252* 70 - 99 mg/dL Final  . BUN 04/15/2018 15  8 - 23 mg/dL Final  . Creatinine, Ser 04/15/2018 0.69  0.61 - 1.24 mg/dL Final  . Calcium 04/15/2018 9.4  8.9 - 10.3 mg/dL Final  . Total Protein 04/15/2018 6.6  6.5 - 8.1 g/dL Final  . Albumin 04/15/2018 3.7  3.5 - 5.0 g/dL Final  . AST 04/15/2018 28  15 - 41 U/L Final  . ALT 04/15/2018 23  0 - 44 U/L Final  . Alkaline Phosphatase 04/15/2018 75  38 - 126 U/L Final  . Total Bilirubin 04/15/2018 0.8  0.3 - 1.2 mg/dL Final  . GFR calc non Af Amer 04/15/2018 >60  >60 mL/min Final  . GFR calc Af Amer 04/15/2018 >60  >60 mL/min Final   Comment: (NOTE) The eGFR has been calculated using the CKD EPI equation. This calculation has not been validated in all clinical situations. eGFR's persistently <60 mL/min signify possible Chronic Kidney Disease.   Georgiann Hahn gap 04/15/2018 6  5 - 15 Final   Performed at [facility], [address] [place], [place] [address]  . WBC 04/15/2018 3.2* 3.8 - 10.6 K/uL Final  . RBC 04/15/2018 3.14* 4.40 - 5.90 MIL/uL Final  . Hemoglobin 04/15/2018 10.3* 13.0 - 18.0 g/dL Final  . HCT 04/15/2018 30.2* 40.0 - 52.0 % Final  . MCV 04/15/2018 96.2  80.0 - 100.0 fL Final  . MCH 04/15/2018 32.8  26.0 - 34.0 pg Final  . MCHC 04/15/2018 34.1  32.0 - 36.0 g/dL Final  . RDW 04/15/2018 16.0* 11.5 - 14.5 % Final  . Platelets 04/15/2018 135* 150 - 440 K/uL Final  . Neutrophils Relative % 04/15/2018 68  % Final  . Neutro Abs 04/15/2018 2.2  1.4 - 6.5 K/uL Final  . Lymphocytes Relative  04/15/2018 21  % Final  . Lymphs Abs 04/15/2018 0.7* 1.0 - 3.6 K/uL Final  . Monocytes Relative 04/15/2018 8  % Final  . Monocytes Absolute 04/15/2018 0.2  0.2 - 1.0 K/uL Final  . Eosinophils Relative 04/15/2018 2  % Final  . Eosinophils Absolute 04/15/2018 0.1  0 - 0.7 K/uL Final  . Basophils Relative 04/15/2018 1  % Final  . Basophils Absolute 04/15/2018 0.0  0 - 0.1 K/uL Final   Performed at [facility], [address] [place], [place] [address]    Assessment:  Patrick Mcgee is a 72 y.o. 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  He presented with abdominal pain and weight loss.  Abdomen and pelvic CT on 12/01/2017 revealed a suspicious enhancing, filling defect involving the lower pole collecting system of the left kidney with asymmetric hypoenhancement of the inferior pole cortex of left kidney noted. Findings were worrisome for urothelial neoplasm.  There was abdominal and pelvic adenopathy compatible with nodal metastasis.  There was a 2.6 cm right para celiac node, 1,6 cm left retroperitoneal node, 1.4 cm right retroperitoneal node, 2.1 cm right common iliac node and 1 cm left external iliac node.  There was aortic atherosclerosis, kidney cysts, and prostate gland enlargement.  Chest CT on 12/07/2017 revealed no evidence of metastatic disease.    He underwent left ureteroscopy with renal pelvis biopsy and left ureteral stent placement on 12/10/2017.  Findings included a large nodular, vascular tumor within the left lower pole collecting system completely obstructing the entire left lower pole moiety.  Ureter, right kidney, and bladder was normal.  Pathology revealed  small fragments of high-grade urothelial carcinoma with a small focus of invasion.  He is s/p day 1 of cycle #6 carboplatin + gemcitabine (12/21/2017 - 04/08/2018). Day 8 of cycle #5 held due to low platelets and upcoming travel plans. He is tolerating treatment well.    Chest, abdomen, and pelvic CT on 02/22/2018 revealed marked improvement in nodal metastatic disease in the abdomen/pelvis. There continued to be enhancing material similar to prior in the lower pole collecting system on the left, with hypoenhancement of the subtended parenchyma in the lower third of the left kidney suggesting poor urinary drainage from underlying tumor.  There was no new metastatic lesions are identified.   He has B12 deficiency.  B12 was 247 on 12/31/2017.  He began oral B12 on 01/07/2018.  B12 was 983 on 02/11/2018.  He has diabetes and coronary artery disease s/p MI in 1995.  He is s/p stent placement x 5-6.  Colonoscopy in 2018 was negative.  He has chemotherapy induced anemia requiring Procrit injections every 2 weeks to support his counts (last 03/18/2018).   Symptomatically, patient is doing "ok" today. He denies that he had a bad week last week. He has had more fatigue. No acute concerns. Bowels and neuropathy is stable. Exam is unremarkable.   Plan: 1. Labs today: CBC with diff, CMP, Mg 2. Metastatic urothelial carcinoma Doing well overall. Tolerating treatments with minimal side effects. Labs reviewed. Blood counts stable and adequate enough for treatment. Will proceed with day 8 of cycle #6 gemcitabine. Schedule abdomen and pelvis CT in conjunction with planned chest CT on 05/31/2018. Discuss symptom management.  Patient has antiemetics and pain medications at home to use on a PRN basis. Patient  advising that the  prescribed interventions are adequate at this point. Continue all medications as previously prescribed.  3. Chemotherapy induced anemia  Hemoglobin 10.3 and hematocrit 30.2.  Hemoglobin goal < 10. Procrit due 04/22/2018.   Continue labs (CBC) and +/- Procrit every 2 weeks.  4. Constipation  Bowels managed on current regimen.  Patient using as needed interventions to control. 5. Cough  Chronic. Patient has an interstitial lung disease  diagnosis.  High-resolution chest CT scheduled for 05/31/2018.  Scheduled follow-up with pulmonary medicine Ashby Dawes, MD) following CT scan.  6. Hypomagnesemia  Magnesium stable at 1.8  Continue Mag-Ox 400 mg every other day. 7. B12 deficiency  B12 level improving on oral supplementation.  Continue oral B12 1000 mcg daily. 8. Nutrition  His weight today is 174 lb 3.2 oz (79 kg). His BMI of 26.49 kg/m.   Encouraged him to increase his intake of calorie and protein dense food choices.  9. RTC every 2 weeks for labs (CBC) and +/- Procrit injection.  10. RTC on 04/29/2018 for MD assessment, labs (CBC with differential, CMP, magnesium), day 1 of cycle #7 carboplatin + gemcitabine. 11. RTC on 05/06/2018  for MD assessment, labs (CBC with differential, CMP, magnesium), day 8 of cycle #7 gemcitabine, and Procrit injection.    Honor Loh, NP  04/15/18, 12:19 PM   I saw and evaluated the patient, participating in the key portions of the service and reviewing pertinent diagnostic studies and records.  I reviewed the nurse practitioner's note and agree with the findings and the plan.  The assessment and plan were discussed with the patient.  Several questions were asked by the patient and answered.   Nolon Stalls, MD 04/15/2018,12:19 PM

## 2018-04-15 NOTE — Progress Notes (Signed)
Here for follow up. Per pt overall" feeling good" added that he occasionally  feels weak.

## 2018-04-21 ENCOUNTER — Encounter: Payer: Self-pay | Admitting: Hematology and Oncology

## 2018-04-22 ENCOUNTER — Inpatient Hospital Stay: Payer: Medicare Other

## 2018-04-22 VITALS — BP 135/73

## 2018-04-22 DIAGNOSIS — C778 Secondary and unspecified malignant neoplasm of lymph nodes of multiple regions: Secondary | ICD-10-CM | POA: Diagnosis not present

## 2018-04-22 DIAGNOSIS — D701 Agranulocytosis secondary to cancer chemotherapy: Secondary | ICD-10-CM

## 2018-04-22 DIAGNOSIS — C642 Malignant neoplasm of left kidney, except renal pelvis: Secondary | ICD-10-CM | POA: Diagnosis not present

## 2018-04-22 DIAGNOSIS — Z5111 Encounter for antineoplastic chemotherapy: Secondary | ICD-10-CM | POA: Diagnosis not present

## 2018-04-22 DIAGNOSIS — T451X5A Adverse effect of antineoplastic and immunosuppressive drugs, initial encounter: Principal | ICD-10-CM

## 2018-04-22 DIAGNOSIS — Z23 Encounter for immunization: Secondary | ICD-10-CM | POA: Diagnosis not present

## 2018-04-22 DIAGNOSIS — D6481 Anemia due to antineoplastic chemotherapy: Secondary | ICD-10-CM | POA: Diagnosis not present

## 2018-04-22 DIAGNOSIS — E1140 Type 2 diabetes mellitus with diabetic neuropathy, unspecified: Secondary | ICD-10-CM | POA: Diagnosis not present

## 2018-04-22 LAB — CBC
HCT: 28.8 % — ABNORMAL LOW (ref 39.0–52.0)
HEMOGLOBIN: 9.5 g/dL — AB (ref 13.0–17.0)
MCH: 31.5 pg (ref 26.0–34.0)
MCHC: 33 g/dL (ref 30.0–36.0)
MCV: 95.4 fL (ref 80.0–100.0)
Platelets: 51 10*3/uL — ABNORMAL LOW (ref 150–400)
RBC: 3.02 MIL/uL — AB (ref 4.22–5.81)
RDW: 14.5 % (ref 11.5–15.5)
WBC: 2 10*3/uL — ABNORMAL LOW (ref 4.0–10.5)
nRBC: 0 % (ref 0.0–0.2)

## 2018-04-22 MED ORDER — EPOETIN ALFA 10000 UNIT/ML IJ SOLN
10000.0000 [IU] | Freq: Once | INTRAMUSCULAR | Status: AC
  Administered 2018-04-22: 10000 [IU] via SUBCUTANEOUS
  Filled 2018-04-22: qty 2

## 2018-04-23 ENCOUNTER — Other Ambulatory Visit: Payer: Self-pay | Admitting: Family Medicine

## 2018-04-23 NOTE — Telephone Encounter (Signed)
Pt Needs a refill   Nystatin cream, uses as needed  [facility]  CB#  [phone]  Thanks [employer]

## 2018-04-23 NOTE — Telephone Encounter (Signed)
Please review. Thanks!  

## 2018-04-24 MED ORDER — NYSTATIN 100000 UNIT/GM EX CREA: TOPICAL_CREAM | Freq: Two times a day (BID) | CUTANEOUS | 1 refills | Status: AC

## 2018-04-29 ENCOUNTER — Other Ambulatory Visit: Payer: Self-pay | Admitting: Family Medicine

## 2018-04-29 ENCOUNTER — Inpatient Hospital Stay: Payer: Medicare Other

## 2018-04-29 ENCOUNTER — Encounter: Payer: Self-pay | Admitting: Oncology

## 2018-04-29 ENCOUNTER — Other Ambulatory Visit: Payer: Self-pay | Admitting: Hematology and Oncology

## 2018-04-29 ENCOUNTER — Inpatient Hospital Stay (HOSPITAL_BASED_OUTPATIENT_CLINIC_OR_DEPARTMENT_OTHER): Payer: Medicare Other | Admitting: Urgent Care

## 2018-04-29 ENCOUNTER — Encounter: Payer: Self-pay | Admitting: Urgent Care

## 2018-04-29 VITALS — BP 157/83 | HR 62 | Temp 97.5°F | Resp 18 | Ht 68.0 in | Wt 176.4 lb

## 2018-04-29 DIAGNOSIS — Z23 Encounter for immunization: Secondary | ICD-10-CM | POA: Diagnosis not present

## 2018-04-29 DIAGNOSIS — R059 Cough, unspecified: Secondary | ICD-10-CM

## 2018-04-29 DIAGNOSIS — E1140 Type 2 diabetes mellitus with diabetic neuropathy, unspecified: Secondary | ICD-10-CM | POA: Diagnosis not present

## 2018-04-29 DIAGNOSIS — C778 Secondary and unspecified malignant neoplasm of lymph nodes of multiple regions: Secondary | ICD-10-CM

## 2018-04-29 DIAGNOSIS — T451X5A Adverse effect of antineoplastic and immunosuppressive drugs, initial encounter: Secondary | ICD-10-CM

## 2018-04-29 DIAGNOSIS — D6481 Anemia due to antineoplastic chemotherapy: Secondary | ICD-10-CM | POA: Diagnosis not present

## 2018-04-29 DIAGNOSIS — Z87891 Personal history of nicotine dependence: Secondary | ICD-10-CM

## 2018-04-29 DIAGNOSIS — E538 Deficiency of other specified B group vitamins: Secondary | ICD-10-CM

## 2018-04-29 DIAGNOSIS — C642 Malignant neoplasm of left kidney, except renal pelvis: Secondary | ICD-10-CM

## 2018-04-29 DIAGNOSIS — Z5111 Encounter for antineoplastic chemotherapy: Secondary | ICD-10-CM

## 2018-04-29 DIAGNOSIS — J849 Interstitial pulmonary disease, unspecified: Secondary | ICD-10-CM

## 2018-04-29 DIAGNOSIS — R05 Cough: Secondary | ICD-10-CM

## 2018-04-29 DIAGNOSIS — C689 Malignant neoplasm of urinary organ, unspecified: Secondary | ICD-10-CM

## 2018-04-29 DIAGNOSIS — D701 Agranulocytosis secondary to cancer chemotherapy: Secondary | ICD-10-CM

## 2018-04-29 DIAGNOSIS — K5900 Constipation, unspecified: Secondary | ICD-10-CM

## 2018-04-29 DIAGNOSIS — E119 Type 2 diabetes mellitus without complications: Secondary | ICD-10-CM

## 2018-04-29 LAB — CBC WITH DIFFERENTIAL/PLATELET
Abs Immature Granulocytes: 0 10*3/uL (ref 0.00–0.07)
BASOS PCT: 1 %
Basophils Absolute: 0 10*3/uL (ref 0.0–0.1)
EOS ABS: 0.1 10*3/uL (ref 0.0–0.5)
EOS PCT: 2 %
HCT: 28.3 % — ABNORMAL LOW (ref 39.0–52.0)
Hemoglobin: 9.4 g/dL — ABNORMAL LOW (ref 13.0–17.0)
IMMATURE GRANULOCYTES: 0 %
Lymphocytes Relative: 25 %
Lymphs Abs: 0.8 10*3/uL (ref 0.7–4.0)
MCH: 32 pg (ref 26.0–34.0)
MCHC: 33.2 g/dL (ref 30.0–36.0)
MCV: 96.3 fL (ref 80.0–100.0)
Monocytes Absolute: 0.7 10*3/uL (ref 0.1–1.0)
Monocytes Relative: 20 %
NEUTROS ABS: 1.8 10*3/uL (ref 1.7–7.7)
NEUTROS PCT: 52 %
PLATELETS: 227 10*3/uL (ref 150–400)
RBC: 2.94 MIL/uL — ABNORMAL LOW (ref 4.22–5.81)
RDW: 16.2 % — AB (ref 11.5–15.5)
WBC: 3.3 10*3/uL — ABNORMAL LOW (ref 4.0–10.5)
nRBC: 0 % (ref 0.0–0.2)

## 2018-04-29 LAB — COMPREHENSIVE METABOLIC PANEL
ALBUMIN: 3.8 g/dL (ref 3.5–5.0)
ALT: 18 U/L (ref 0–44)
ANION GAP: 6 (ref 5–15)
AST: 21 U/L (ref 15–41)
Alkaline Phosphatase: 73 U/L (ref 38–126)
BILIRUBIN TOTAL: 0.8 mg/dL (ref 0.3–1.2)
BUN: 10 mg/dL (ref 8–23)
CHLORIDE: 106 mmol/L (ref 98–111)
CO2: 27 mmol/L (ref 22–32)
Calcium: 9.7 mg/dL (ref 8.9–10.3)
Creatinine, Ser: 0.64 mg/dL (ref 0.61–1.24)
GFR calc Af Amer: >60 mL/min (ref >60)
GFR calc non Af Amer: >60 mL/min (ref >60)
Glucose, Bld: 148 mg/dL — ABNORMAL HIGH (ref 70–99)
POTASSIUM: 4 mmol/L (ref 3.5–5.1)
SODIUM: 139 mmol/L (ref 135–145)
TOTAL PROTEIN: 6.6 g/dL (ref 6.5–8.1)

## 2018-04-29 LAB — MAGNESIUM: MAGNESIUM: 1.9 mg/dL (ref 1.7–2.4)

## 2018-04-29 MED ORDER — DEXAMETHASONE SODIUM PHOSPHATE 10 MG/ML IJ SOLN
10.0000 mg | Freq: Once | INTRAMUSCULAR | Status: AC
  Administered 2018-04-29: 10 mg via INTRAVENOUS
  Filled 2018-04-29: qty 1

## 2018-04-29 MED ORDER — PALONOSETRON HCL INJECTION 0.25 MG/5ML
0.2500 mg | Freq: Once | INTRAVENOUS | Status: AC
  Administered 2018-04-29: 0.25 mg via INTRAVENOUS
  Filled 2018-04-29: qty 5

## 2018-04-29 MED ORDER — SODIUM CHLORIDE 0.9 % IV SOLN
1600.0000 mg | Freq: Once | INTRAVENOUS | Status: AC
  Administered 2018-04-29: 1600 mg via INTRAVENOUS
  Filled 2018-04-29: qty 26.3

## 2018-04-29 MED ORDER — HEPARIN SOD (PORK) LOCK FLUSH 100 UNIT/ML IV SOLN
500.0000 [IU] | Freq: Once | INTRAVENOUS | Status: AC
  Administered 2018-04-29: 500 [IU] via INTRAVENOUS
  Filled 2018-04-29: qty 5

## 2018-04-29 MED ORDER — SODIUM CHLORIDE 0.9 % IV SOLN
Freq: Once | INTRAVENOUS | Status: AC
  Administered 2018-04-29: 12:00:00 via INTRAVENOUS
  Filled 2018-04-29: qty 250

## 2018-04-29 MED ORDER — SODIUM CHLORIDE 0.9% FLUSH
10.0000 mL | Freq: Once | INTRAVENOUS | Status: AC
  Administered 2018-04-29: 10 mL via INTRAVENOUS
  Filled 2018-04-29: qty 10

## 2018-04-29 MED ORDER — SODIUM CHLORIDE 0.9 % IV SOLN
366.4000 mg | Freq: Once | INTRAVENOUS | Status: AC
  Administered 2018-04-29: 370 mg via INTRAVENOUS
  Filled 2018-04-29: qty 37

## 2018-04-29 NOTE — Progress Notes (Signed)
[facility] day:  04/29/2018   Chief Complaint: Patrick Mcgee is a 72 y.o. male with metastatic urothelial carcinoma who is seen for assessment prior to day 1 of cycle #7 carboplatin + gemcitabine.  HPI:  The patient was last seen in the medical oncology clinic on 04/15/2018.  At that time, was recovering from a "bad week". He complained of excessive fatigue, myalgias, and generalized weakness. No B symptoms or infections. Neuropathy was stable. Eating "ok"; weight down 1 pound. Exam was unremarkable. WBC 3200 ([place] 2200). Platelets 135,000. Glucose 252.  He received day 8 of cycle #6 gemcitabine.   CBC on 04/22/2018 revealed a WBC of 2000. Hemoglobin 9.5, hematocrit 28.8, MCV 95.4, and platelets 51,000. He received Procrit 10,000 unit injection.    In the interim, patient notes that he has been "great". He is continuously making efforts to become more active. His bowels have stabilized. He denies nausea and vomiting. No pain. Patient denies that he has experienced any B symptoms. He denies any interval infections.   Patient continues to have a chronic cough. He denies shortness of breath. Patient is followed by pulmonary medicine for an interstitial lung disease. Next visit is in 05/2018. Patient denies urinary concerns. He has not appreciated any evidence of gross hematuria.   Patient advises that he maintains an adequate appetite. He is eating well. Weight today is 176 lb 6.4 oz (80 kg), which compared to his last visit to the clinic, represents a  2 pound increase.   Patient denies pain in the clinic today.   Past Medical History:  Diagnosis Date  . Acid reflux   . Anxiety   . Depression   . Diabetes mellitus without complication ([place])   . Hyperlipidemia   . Hypertension   . Myocardial infarction ([place]) 1995  . Sleep apnea   . Urothelial cancer ([place]) 11/2017   Left Urothelial mass, chemo tx's    Past Surgical History:  Procedure  Laterality Date  . CATARACT EXTRACTION Left   . COLONOSCOPY  2010   [facility]  . COLONOSCOPY WITH PROPOFOL N/A 10/04/2016   Procedure: COLONOSCOPY WITH PROPOFOL;  Surgeon: [name], MD;  Location: [facility] ENDOSCOPY;  Service: Endoscopy;  Laterality: N/A;  . [place], 2000, 2001, 2014  . CYSTOSCOPY W/ RETROGRADES Bilateral 12/10/2017   Procedure: CYSTOSCOPY WITH RETROGRADE PYELOGRAM;  Surgeon: [name], MD;  Location: [facility] ORS;  Service: Urology;  Laterality: Bilateral;  . CYSTOSCOPY WITH STENT PLACEMENT Left 12/10/2017   Procedure: CYSTOSCOPY WITH STENT PLACEMENT;  Surgeon: [name], MD;  Location: [facility] ORS;  Service: Urology;  Laterality: Left;  . INGUINAL HERNIA REPAIR Right 08/09/2015   Procedure: HERNIA REPAIR INGUINAL ADULT;  Surgeon: [name], MD;  Location: [facility] ORS;  Service: General;  Laterality: Right;  . NASAL SINUS SURGERY    . nuclear stress test    . PORTA CATH INSERTION N/A 12/19/2017   Procedure: PORTA CATH INSERTION;  Surgeon: [name], MD;  Location: [facility] LAB;  Service: Cardiovascular;  Laterality: N/A;  . URETERAL BIOPSY Left 12/10/2017   Procedure: URETERAL & renal PELVIS BIOPSY;  Surgeon: [name], MD;  Location: [facility] ORS;  Service: Urology;  Laterality: Left;  . URETEROSCOPY Left 12/10/2017   Procedure: URETEROSCOPY;  Surgeon: [name], MD;  Location: [facility] ORS;  Service: Urology;  Laterality: Left;    Family History  Problem Relation Age of Onset  .  Heart disease Mother   . Cancer Father        Lung and colon cancer  . Heart disease Father   . Emphysema Maternal Grandfather   . Tuberculosis Maternal Grandmother     Social History:  reports that he has quit smoking. His smoking use included cigars. He has quit using smokeless tobacco.  His smokeless tobacco use included chew. He reports that he drank alcohol. He reports that he does not use drugs.  Patient is retired from the  [employer] industry. Patient denies known exposures to radiation on toxins. The patient has a family trip planned to [place] between 03/30/2018 - 04/06/2018. The patient is accompanied by his wife, Dianne, today.  Allergies:  Allergies  Allergen Reactions  . Sulfa Antibiotics Other (See Comments)    Joint pain  . Ace Inhibitors Cough  . Invokana [Canagliflozin] Other (See Comments)    Leg pain  . Penicillins Rash    Has patient had a PCN reaction causing immediate rash, facial/tongue/throat swelling, SOB or lightheadedness with hypotension: Yes Has patient had a PCN reaction causing severe rash involving mucus membranes or skin necrosis: No Has patient had a PCN reaction that required hospitalization: No Has patient had a PCN reaction occurring within the last 10 years: No If all of the above answers are "NO", then may proceed with Cephalosporin use.    Current Medications: Current Outpatient Medications  Medication Sig Dispense Refill  . atorvastatin (LIPITOR) 40 MG tablet Take 1 tablet (40 mg total) by mouth at bedtime. 90 tablet 3  . carvedilol (COREG) 12.5 MG tablet TAKE ONE TABLET BY MOUTH TWICE DAILY 180 tablet 3  . feeding supplement, ENSURE ENLIVE, (ENSURE ENLIVE) LIQD Take 237 mLs by mouth 3 (three) times daily between meals. 90 Bottle 0  . fluticasone (FLONASE) 50 MCG/ACT nasal spray Place 2 sprays into both nostrils daily. 16 g 12  . glucose blood (CONTOUR NEXT TEST) test strip Checks sugar 4 times daily. DX E11.9-strips for Contour next EZ meter 360 each 3  . losartan (COZAAR) 100 MG tablet TAKE ONE TABLET BY MOUTH ONCE DAILY 90 tablet 3  . meclizine (ANTIVERT) 25 MG tablet Take 25 mg by mouth 3 (three) times daily as needed for dizziness.    . metFORMIN (GLUCOPHAGE) 1000 MG tablet Take 1 tablet (1,000 mg total) by mouth 2 (two) times daily with a meal. 180 tablet 3  . acetaminophen (TYLENOL) 500 MG tablet Take 500 mg by mouth every 6 (six) hours as needed for moderate pain.  When taking oxycodone 5 mg tablets    . ALPRAZolam (XANAX) 0.25 MG tablet Take 1 tablet (0.25 mg total) by mouth 3 (three) times daily as needed for anxiety. (Patient not taking: Reported on 04/15/2018) 20 tablet 0  . clobetasol cream (TEMOVATE) 0.05 % Apply 1 application topically as needed (for skin rash). (Patient not taking: Reported on 04/29/2018) 30 g 1  . fentaNYL (DURAGESIC - DOSED MCG/HR) 25 MCG/HR patch Place 1 patch (25 mcg total) onto the skin every 3 (three) days. (Patient not taking: Reported on 04/15/2018) 10 patch 0  . Ivermectin (SOOLANTRA) 1 % CREA Apply topically at bedtime. To face    . lactulose (CEPHULAC) 20 g packet Take 1 packet (20 g total) by mouth 2 (two) times daily as needed (constipation). (Patient not taking: Reported on 04/29/2018) 60 each 0  . lidocaine-prilocaine (EMLA) cream Apply 1 application topically as needed. (Patient not taking: Reported on 04/15/2018) 30 g 3  .   nystatin cream (MYCOSTATIN) Apply topically 2 (two) times daily. (Patient not taking: Reported on 04/29/2018) 15 g 1  . ondansetron (ZOFRAN) 8 MG tablet Take 1 tablet (8 mg total) by mouth every 8 (eight) hours as needed for nausea or vomiting (if needed beginning 3 days after chemotherapy). (Patient not taking: Reported on 04/15/2018) 20 tablet 0   No current facility-administered medications for this visit.    Facility-Administered Medications Ordered in Other Visits  Medication Dose Route Frequency Provider Last Rate Last Dose  . heparin lock flush 100 unit/mL  500 Units Intravenous Once [name], MD         Review of Systems  Constitutional: Positive for malaise/fatigue. Negative for diaphoresis, fever and weight loss (up2 pounds).  HENT: Negative.   Eyes: Negative.   Respiratory: Positive for cough (mild; chronic). Negative for hemoptysis, sputum production and shortness of breath.   Cardiovascular: Negative for chest pain, palpitations, orthopnea, leg swelling and PND.       MI  s/p angioplasty and placement of 5-6 stents (last in 2015). On 3 antihypertensives. (+) OSAH and uses nocturnal PAP therapy.   Gastrointestinal: Positive for heartburn (on daily PPI). Negative for abdominal pain, blood in stool, constipation, diarrhea, melena, nausea and vomiting.  Genitourinary: Negative for dysuria, frequency, hematuria and urgency.  Musculoskeletal: Negative for back pain, falls, joint pain and myalgias.  Skin: Negative for itching and rash.  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 Negative for dizziness, tremors and headaches.  Endo/Heme/Allergies: Positive for environmental allergies (seasonal allergies). Does not bruise/bleed easily.       PMH (+) for diabetes  Psychiatric/Behavioral: Negative for depression, memory loss and suicidal ideas. The patient is not nervous/anxious and does not have insomnia.   All other systems reviewed and are negative.  Performance status (ECOG): 2 - Symptomatic, <50% confined to bed  Vital signs BP (!) 157/83 (BP Location: Left Arm, Patient Position: Sitting)   Pulse 62   Temp (!) 97.5 F (36.4 C) (Tympanic)   Resp 18   Ht 5' 8" (1.727 m)   Wt 176 lb 6.4 oz (80 kg)   SpO2 97%   BMI 26.82 kg/m   Physical Exam  Constitutional: He is oriented to person, place, and time and well-developed, well-nourished, and in no distress.  HENT:  Head: Normocephalic and atraumatic.  Mouth/Throat: Oropharynx is clear and moist and mucous membranes are normal.  Grey hair  Eyes: Pupils are equal, round, and reactive to light. EOM are normal. No scleral icterus.  Brown eyes  Neck: Normal range of motion. Neck supple. No tracheal deviation present. No thyromegaly present.  Cardiovascular: Normal rate, regular rhythm, normal heart sounds and intact distal pulses. Exam reveals no gallop and no friction rub.  No murmur heard. Pulmonary/Chest: Effort normal and breath sounds normal. No  respiratory distress. He has no wheezes. He has no rales.  Abdominal: Soft. Bowel sounds are normal. He exhibits no distension. There is no tenderness.  Musculoskeletal: Normal range of motion. He exhibits no edema or tenderness.  Lymphadenopathy:    He has no cervical adenopathy.    He has no axillary adenopathy.       Right: No inguinal and no supraclavicular adenopathy present.       Left: No inguinal and no supraclavicular adenopathy present.  Neurological: He is alert and oriented to person, place, and time.  Skin: Skin is warm and dry. No rash noted. No erythema.  Psychiatric: Mood, affect  and judgment normal.  Nursing note and vitals reviewed.   Infusion on 04/29/2018  Component Date Value Ref Range Status  . Magnesium 04/29/2018 1.9  1.7 - 2.4 mg/dL Final   Performed at [facility], [address] [place], [place] [address]  . Sodium 04/29/2018 139  135 - 145 mmol/L Final  . Potassium 04/29/2018 4.0  3.5 - 5.1 mmol/L Final  . Chloride 04/29/2018 106  98 - 111 mmol/L Final  . CO2 04/29/2018 27  22 - 32 mmol/L Final  . Glucose, Bld 04/29/2018 148* 70 - 99 mg/dL Final  . BUN 04/29/2018 10  8 - 23 mg/dL Final  . Creatinine, Ser 04/29/2018 0.64  0.61 - 1.24 mg/dL Final  . Calcium 04/29/2018 9.7  8.9 - 10.3 mg/dL Final  . Total Protein 04/29/2018 6.6  6.5 - 8.1 g/dL Final  . Albumin 04/29/2018 3.8  3.5 - 5.0 g/dL Final  . AST 04/29/2018 21  15 - 41 U/L Final  . ALT 04/29/2018 18  0 - 44 U/L Final  . Alkaline Phosphatase 04/29/2018 73  38 - 126 U/L Final  . Total Bilirubin 04/29/2018 0.8  0.3 - 1.2 mg/dL Final  . GFR calc non Af Amer 04/29/2018 >60  >60 mL/min Final  . GFR calc Af Amer 04/29/2018 >60  >60 mL/min Final   Comment: (NOTE) The eGFR has been calculated using the CKD EPI equation. This calculation has not been validated in all clinical situations. eGFR's persistently <60 mL/min signify possible Chronic Kidney Disease.   . Anion gap 04/29/2018 6  5 - 15  Final   Performed at [facility], [address] [place],  [address]  . WBC 04/29/2018 3.3* 4.0 - 10.5 K/uL Final  . RBC 04/29/2018 2.94* 4.22 - 5.81 MIL/uL Final  . Hemoglobin 04/29/2018 9.4* 13.0 - 17.0 g/dL Final  . HCT 04/29/2018 28.3* 39.0 - 52.0 % Final  . MCV 04/29/2018 96.3  80.0 - 100.0 fL Final  . MCH 04/29/2018 32.0  26.0 - 34.0 pg Final  . MCHC 04/29/2018 33.2  30.0 - 36.0 g/dL Final  . RDW 04/29/2018 16.2* 11.5 - 15.5 % Final  . Platelets 04/29/2018 227  150 - 400 K/uL Final  . nRBC 04/29/2018 0.0  0.0 - 0.2 % Final  . Neutrophils Relative % 04/29/2018 52  % Final  . Neutro Abs 04/29/2018 1.8  1.7 - 7.7 K/uL Final  . Lymphocytes Relative 04/29/2018 25  % Final  . Lymphs Abs 04/29/2018 0.8  0.7 - 4.0 K/uL Final  . Monocytes Relative 04/29/2018 20  % Final  . Monocytes Absolute 04/29/2018 0.7  0.1 - 1.0 K/uL Final  . Eosinophils Relative 04/29/2018 2  % Final  . Eosinophils Absolute 04/29/2018 0.1  0.0 - 0.5 K/uL Final  . Basophils Relative 04/29/2018 1  % Final  . Basophils Absolute 04/29/2018 0.0  0.0 - 0.1 K/uL Final  . Immature Granulocytes 04/29/2018 0  % Final  . Abs Immature Granulocytes 04/29/2018 0.00  0.00 - 0.07 K/uL Final   Performed at [facility], [address] [place],  [address]    Assessment:  Jlen D Mcgonagle is a 72 y.o. 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  He presented with abdominal pain and weight loss.  Abdomen and pelvic CT on 12/01/2017 revealed a suspicious enhancing, filling defect involving the lower pole collecting system of the left kidney with asymmetric hypoenhancement of the inferior pole   cortex of left kidney noted. Findings were worrisome for urothelial neoplasm.  There was abdominal and pelvic adenopathy compatible with nodal metastasis.  There was a 2.6 cm right para celiac node, 1,6 cm left retroperitoneal node, 1.4 cm right retroperitoneal node, 2.1 cm  right common iliac node and 1 cm left external iliac node.  There was aortic atherosclerosis, kidney cysts, and prostate gland enlargement.  Chest CT on 12/07/2017 revealed no evidence of metastatic disease.    He underwent left ureteroscopy with renal pelvis biopsy and left ureteral stent placement on 12/10/2017.  Findings included a large nodular, vascular tumor within the left lower pole collecting system completely obstructing the entire left lower pole moiety.  Ureter, right kidney, and bladder was normal.  Pathology revealed small fragments of high-grade urothelial carcinoma with a small focus of invasion.  He is s/p cycle #7 carboplatin + gemcitabine (12/21/2017 - 04/15/2018). Day 8 of cycle #5 held due to low platelets and upcoming travel plans. He is tolerating treatment well.   Chest, abdomen, and pelvic CT on 02/22/2018 revealed marked improvement in nodal metastatic disease in the abdomen/pelvis. There continued to be enhancing material similar to prior in the lower pole collecting system on the left, with hypoenhancement of the subtended parenchyma in the lower third of the left kidney suggesting poor urinary drainage from underlying tumor.  There was no new metastatic lesions are identified.   He has B12 deficiency.  B12 was 247 on 12/31/2017.  He began oral B12 on 01/07/2018.  B12 was 983 on 02/11/2018.  He has diabetes and coronary artery disease s/p MI in 1995.  He is s/p stent placement x 5-6.  Colonoscopy in 2018 was negative.  He has chemotherapy induced anemia requiring Procrit injections every 2 weeks to support his counts (last 04/22/2018).   Symptomatically, patient is doing "great". He denies any acute concerns. Remains active. Patient denies that he has experienced any B symptoms. He denies any interval infections.  Bowels are stable.  Exam is grossly unchanged from previous.  WBC 3300 (ANC 1800).  Hemoglobin 9.4, hematocrit 28.3, and platelets 227,000.    Plan: 1. Labs  today: CBC with diff, CMP, Mg 2. Metastatic urothelial carcinoma  Doing well overall. Tolerating treatments with minimal side effects.  Labs reviewed. Blood counts stable and adequate enough for treatment. Will proceed with day 1 of cycle #7 carboplatin + gemcitabine.  Review plans for interval imaging.  Pulmonary medicine has ordered a high-resolution chest CT for 05/31/2018.  CT imaging of the abdomen and pelvis to be obtained at the same time.  Discuss symptom management.  Patient has antiemetics and pain medications at home to use on a PRN basis. Patient  advising that the prescribed interventions are adequate at this point. Continue all medications as previously prescribed.  3. Chemotherapy-induced anemia  Hemoglobin 9.4 and hematocrit 28.3.  Receives Procrit if hemoglobin < 10.   Last Procrit injection was given on 04/22/2018.  Continue labs (CBC) and +/- Procrit injection every 2 weeks. 4. Constipation  Bowels managed on current regimen.  Patient using as needed interventions to control. 5. Cough  Chronic.  Patient has an interstitial lung disease diagnosed.  High-resolution chest CT scheduled for 05/29/2018.  Scheduled follow-up with pulmonary medicine (Ramachandran, MD) following CT scan. 6. Hypomagnesemia  Magnesium stable at 1.9 g/dL.  Continue Mag-Ox 400 mg every other day. 7. B12 deficiency  B12 level improving oral supplementation.  Continue oral B12 thousand micrograms daily. 8. RTC every 2 weeks for   labs (CBC) and +/- Procrit injection.  9. RTC on 05/06/2018  for MD assessment, labs (CBC with differential, CMP, magnesium), day 8 of cycle #7 gemcitabine, and Procrit injection.    Honor Loh, NP  04/29/18, 11:27 AM

## 2018-04-29 NOTE — Progress Notes (Signed)
No new changes noted today 

## 2018-04-30 ENCOUNTER — Telehealth: Payer: Self-pay | Admitting: Family Medicine

## 2018-04-30 DIAGNOSIS — E119 Type 2 diabetes mellitus without complications: Secondary | ICD-10-CM

## 2018-04-30 MED ORDER — EMPAGLIFLOZIN 10 MG PO TABS: 10.0000 mg | ORAL_TABLET | Freq: Every day | ORAL | 11 refills | Status: DC

## 2018-04-30 NOTE — Telephone Encounter (Signed)
Will forward to PCP  Bacigalupo, Dionne Bucy, MD, MPH [facility] 04/30/2018 8:13 AM

## 2018-04-30 NOTE — Telephone Encounter (Signed)
Pt needs refill  Jardiance  10 mg  He only has one pill left  [place]  CB#  [phone]  Thanks Con Memos

## 2018-05-06 ENCOUNTER — Inpatient Hospital Stay: Payer: Medicare Other

## 2018-05-06 ENCOUNTER — Encounter: Payer: Self-pay | Admitting: Hematology and Oncology

## 2018-05-06 ENCOUNTER — Other Ambulatory Visit: Payer: Self-pay

## 2018-05-06 ENCOUNTER — Other Ambulatory Visit: Payer: Medicare Other

## 2018-05-06 ENCOUNTER — Ambulatory Visit: Payer: Medicare Other

## 2018-05-06 ENCOUNTER — Inpatient Hospital Stay (HOSPITAL_BASED_OUTPATIENT_CLINIC_OR_DEPARTMENT_OTHER): Payer: Medicare Other | Admitting: Hematology and Oncology

## 2018-05-06 VITALS — BP 126/79 | HR 58 | Temp 97.7°F | Resp 18 | Ht 68.0 in | Wt 172.6 lb

## 2018-05-06 DIAGNOSIS — T451X5A Adverse effect of antineoplastic and immunosuppressive drugs, initial encounter: Secondary | ICD-10-CM

## 2018-05-06 DIAGNOSIS — C689 Malignant neoplasm of urinary organ, unspecified: Secondary | ICD-10-CM

## 2018-05-06 DIAGNOSIS — E1165 Type 2 diabetes mellitus with hyperglycemia: Secondary | ICD-10-CM | POA: Diagnosis not present

## 2018-05-06 DIAGNOSIS — D6481 Anemia due to antineoplastic chemotherapy: Secondary | ICD-10-CM | POA: Diagnosis not present

## 2018-05-06 DIAGNOSIS — C778 Secondary and unspecified malignant neoplasm of lymph nodes of multiple regions: Secondary | ICD-10-CM

## 2018-05-06 DIAGNOSIS — Z5111 Encounter for antineoplastic chemotherapy: Secondary | ICD-10-CM

## 2018-05-06 DIAGNOSIS — E538 Deficiency of other specified B group vitamins: Secondary | ICD-10-CM | POA: Diagnosis not present

## 2018-05-06 DIAGNOSIS — C642 Malignant neoplasm of left kidney, except renal pelvis: Secondary | ICD-10-CM | POA: Diagnosis not present

## 2018-05-06 DIAGNOSIS — Z87891 Personal history of nicotine dependence: Secondary | ICD-10-CM | POA: Diagnosis not present

## 2018-05-06 DIAGNOSIS — J849 Interstitial pulmonary disease, unspecified: Secondary | ICD-10-CM

## 2018-05-06 DIAGNOSIS — E1140 Type 2 diabetes mellitus with diabetic neuropathy, unspecified: Secondary | ICD-10-CM | POA: Diagnosis not present

## 2018-05-06 DIAGNOSIS — D701 Agranulocytosis secondary to cancer chemotherapy: Secondary | ICD-10-CM

## 2018-05-06 DIAGNOSIS — Z23 Encounter for immunization: Secondary | ICD-10-CM

## 2018-05-06 LAB — CBC WITH DIFFERENTIAL/PLATELET
ABS IMMATURE GRANULOCYTES: 0.01 10*3/uL (ref 0.00–0.07)
BASOS ABS: 0 10*3/uL (ref 0.0–0.1)
Basophils Relative: 1 %
EOS ABS: 0 10*3/uL (ref 0.0–0.5)
Eosinophils Relative: 1 %
HCT: 30.2 % — ABNORMAL LOW (ref 39.0–52.0)
Hemoglobin: 9.8 g/dL — ABNORMAL LOW (ref 13.0–17.0)
IMMATURE GRANULOCYTES: 0 %
LYMPHS ABS: 0.6 10*3/uL — AB (ref 0.7–4.0)
Lymphocytes Relative: 24 %
MCH: 31 pg (ref 26.0–34.0)
MCHC: 32.5 g/dL (ref 30.0–36.0)
MCV: 95.6 fL (ref 80.0–100.0)
Monocytes Absolute: 0.2 10*3/uL (ref 0.1–1.0)
Monocytes Relative: 9 %
NEUTROS ABS: 1.7 10*3/uL (ref 1.7–7.7)
NEUTROS PCT: 65 %
NRBC: 0 % (ref 0.0–0.2)
PLATELETS: 211 10*3/uL (ref 150–400)
RBC: 3.16 MIL/uL — ABNORMAL LOW (ref 4.22–5.81)
RDW: 15.1 % (ref 11.5–15.5)
WBC: 2.6 10*3/uL — ABNORMAL LOW (ref 4.0–10.5)

## 2018-05-06 LAB — COMPREHENSIVE METABOLIC PANEL
ALT: 23 U/L (ref 0–44)
ANION GAP: 5 (ref 5–15)
AST: 24 U/L (ref 15–41)
Albumin: 3.8 g/dL (ref 3.5–5.0)
Alkaline Phosphatase: 80 U/L (ref 38–126)
BILIRUBIN TOTAL: 0.7 mg/dL (ref 0.3–1.2)
BUN: 11 mg/dL (ref 8–23)
CALCIUM: 9.5 mg/dL (ref 8.9–10.3)
CO2: 27 mmol/L (ref 22–32)
Chloride: 106 mmol/L (ref 98–111)
Creatinine, Ser: 0.67 mg/dL (ref 0.61–1.24)
GLUCOSE: 157 mg/dL — AB (ref 70–99)
POTASSIUM: 4 mmol/L (ref 3.5–5.1)
Sodium: 138 mmol/L (ref 135–145)
Total Protein: 7 g/dL (ref 6.5–8.1)

## 2018-05-06 LAB — MAGNESIUM: Magnesium: 1.9 mg/dL (ref 1.7–2.4)

## 2018-05-06 LAB — FERRITIN: FERRITIN: 175 ng/mL (ref 24–336)

## 2018-05-06 MED ORDER — INFLUENZA VAC SPLIT HIGH-DOSE 0.5 ML IM SUSY
0.5000 mL | PREFILLED_SYRINGE | Freq: Once | INTRAMUSCULAR | Status: AC
  Administered 2018-05-06: 0.5 mL via INTRAMUSCULAR
  Filled 2018-05-06: qty 0.5

## 2018-05-06 MED ORDER — SODIUM CHLORIDE 0.9% FLUSH
10.0000 mL | INTRAVENOUS | Status: DC | PRN
  Administered 2018-05-06: 10 mL
  Filled 2018-05-06: qty 10

## 2018-05-06 MED ORDER — ONDANSETRON HCL 4 MG/2ML IJ SOLN
8.0000 mg | Freq: Once | INTRAMUSCULAR | Status: AC
  Administered 2018-05-06: 8 mg via INTRAVENOUS
  Filled 2018-05-06: qty 4

## 2018-05-06 MED ORDER — HEPARIN SOD (PORK) LOCK FLUSH 100 UNIT/ML IV SOLN
500.0000 [IU] | Freq: Once | INTRAVENOUS | Status: AC | PRN
  Administered 2018-05-06: 500 [IU]
  Filled 2018-05-06: qty 5

## 2018-05-06 MED ORDER — SODIUM CHLORIDE 0.9 % IV SOLN: Freq: Once | INTRAVENOUS | Status: DC

## 2018-05-06 MED ORDER — SODIUM CHLORIDE 0.9 % IV SOLN
1400.0000 mg | Freq: Once | INTRAVENOUS | Status: AC
  Administered 2018-05-06: 1400 mg via INTRAVENOUS
  Filled 2018-05-06: qty 26.3

## 2018-05-06 MED ORDER — SODIUM CHLORIDE 0.9 % IV SOLN
Freq: Once | INTRAVENOUS | Status: AC
  Administered 2018-05-06: 15:00:00 via INTRAVENOUS
  Filled 2018-05-06: qty 250

## 2018-05-06 MED ORDER — EPOETIN ALFA 10000 UNIT/ML IJ SOLN
10000.0000 [IU] | Freq: Once | INTRAMUSCULAR | Status: AC
  Administered 2018-05-06: 10000 [IU] via SUBCUTANEOUS
  Filled 2018-05-06: qty 2

## 2018-05-06 NOTE — Progress Notes (Signed)
[facility] day:  05/06/2018   Chief Complaint: Patrick Mcgee is a 72 y.o. male with metastatic urothelial carcinoma who is seen for assessment prior to day 8 of cycle #7 carboplatin + gemcitabine.  HPI:  The patient was last seen in the medical oncology clinic on 04/29/2018.  At that time, he was doing "great". He denied any acute concerns. He was active. Bowels were stable.  Exam was grossly unchanged from previous.  WBC was 3300 (ANC 1800).  Hemoglobin was 9.4, hematocrit 28.3, and platelets 227,000.    He received day 1 of cycle #7 carboplatin and gemcitabine.  During the interim, patient noted that he has not had any "get up and go" following his last treatment. He states, "All I wanted to do last week was sleep". He denies any nausea or vomiting. He has been struggling with his blood sugars. His sugars have been running between 350-412  Patient is having allergy symptoms. Patient states, "I am sneezing about 90 times a day". Patient with concerns about upcoming winter and influenza season.  Patient denies that he has experienced any B symptoms. He denies any interval infections.   Patient advises that he maintains an adequate appetite. He is eating well. Weight today is 172 lb 9.6 oz (78.3 kg), which compared to his last visit to the clinic, represents a 4 pound weight loss.     Patient denies pain in the clinic today.   Past Medical History:  Diagnosis Date  . Acid reflux   . Anxiety   . Depression   . Diabetes mellitus without complication ([place])   . Hyperlipidemia   . Hypertension   . Myocardial infarction ([place]) 1995  . Sleep apnea   . Urothelial cancer ([place]) 11/2017   Left Urothelial mass, chemo tx's    Past Surgical History:  Procedure Laterality Date  . CATARACT EXTRACTION Left   . COLONOSCOPY  2010   [facility]  . COLONOSCOPY WITH PROPOFOL N/A 10/04/2016   Procedure: COLONOSCOPY WITH PROPOFOL;  Surgeon: [name], MD;   Location: [facility] ENDOSCOPY;  Service: Endoscopy;  Laterality: N/A;  . [place], 2000, 2001, 2014  . CYSTOSCOPY W/ RETROGRADES Bilateral 12/10/2017   Procedure: CYSTOSCOPY WITH RETROGRADE PYELOGRAM;  Surgeon: [name], MD;  Location: [facility] ORS;  Service: Urology;  Laterality: Bilateral;  . CYSTOSCOPY WITH STENT PLACEMENT Left 12/10/2017   Procedure: CYSTOSCOPY WITH STENT PLACEMENT;  Surgeon: [name], MD;  Location: [facility] ORS;  Service: Urology;  Laterality: Left;  . INGUINAL HERNIA REPAIR Right 08/09/2015   Procedure: HERNIA REPAIR INGUINAL ADULT;  Surgeon: [name], MD;  Location: [facility] ORS;  Service: General;  Laterality: Right;  . NASAL SINUS SURGERY    . nuclear stress test    . PORTA CATH INSERTION N/A 12/19/2017   Procedure: PORTA CATH INSERTION;  Surgeon: [name], MD;  Location: [facility];  Service: Cardiovascular;  Laterality: N/A;  . URETERAL BIOPSY Left 12/10/2017   Procedure: URETERAL & renal PELVIS BIOPSY;  Surgeon: [name], MD;  Location: [facility] ORS;  Service: Urology;  Laterality: Left;  . URETEROSCOPY Left 12/10/2017   Procedure: URETEROSCOPY;  Surgeon: [name], MD;  Location: [facility] ORS;  Service: Urology;  Laterality: Left;    Family History  Problem Relation Age of Onset  . Heart disease Mother   . Cancer Father        Lung and colon cancer  .  Heart disease Father   . Emphysema Maternal Grandfather   . Tuberculosis Maternal Grandmother     Social History:  reports that he has quit smoking. His smoking use included cigars. He has quit using smokeless tobacco.  His smokeless tobacco use included chew. He reports that he drank alcohol. He reports that he does not use drugs.  Patient is retired from the [employer]. Patient denies known exposures to radiation on toxins. The patient has a family trip planned to [facility] between 03/30/2018 - 04/06/2018. The patient is accompanied by his wife,  Patrick Mcgee, today.  Allergies:  Allergies  Allergen Reactions  . Sulfa Antibiotics Other (See Comments)    Joint pain  . Ace Inhibitors Cough  . Invokana [Canagliflozin] Other (See Comments)    Leg pain  . Penicillins Rash    Has patient had a PCN reaction causing immediate rash, facial/tongue/throat swelling, SOB or lightheadedness with hypotension: Yes Has patient had a PCN reaction causing severe rash involving mucus membranes or skin necrosis: No Has patient had a PCN reaction that required hospitalization: No Has patient had a PCN reaction occurring within the last 10 years: No If all of the above answers are "NO", then may proceed with Cephalosporin use.    Current Medications: Current Outpatient Medications  Medication Sig Dispense Refill  . atorvastatin (LIPITOR) 40 MG tablet Take 1 tablet (40 mg total) by mouth at bedtime. 90 tablet 3  . carvedilol (COREG) 12.5 MG tablet TAKE ONE TABLET BY MOUTH TWICE DAILY 180 tablet 3  . clobetasol cream (TEMOVATE) 7.74 % Apply 1 application topically as needed (for skin rash). 30 g 1  . feeding supplement, ENSURE ENLIVE, (ENSURE ENLIVE) LIQD Take 237 mLs by mouth 3 (three) times daily between meals. 90 Bottle 0  . fluticasone (FLONASE) 50 MCG/ACT nasal spray Place 2 sprays into both nostrils daily. 16 g 12  . losartan (COZAAR) 100 MG tablet TAKE ONE TABLET BY MOUTH ONCE DAILY 90 tablet 3  . metFORMIN (GLUCOPHAGE) 1000 MG tablet Take 1 tablet (1,000 mg total) by mouth 2 (two) times daily with a meal. 180 tablet 3  . acetaminophen (TYLENOL) 500 MG tablet Take 500 mg by mouth every 6 (six) hours as needed for moderate pain. When taking oxycodone 5 mg tablets    . ALPRAZolam (XANAX) 0.25 MG tablet Take 1 tablet (0.25 mg total) by mouth 3 (three) times daily as needed for anxiety. (Patient not taking: Reported on 04/15/2018) 20 tablet 0  . empagliflozin (JARDIANCE) 10 MG TABS tablet Take 10 mg by mouth daily. (Patient not taking: Reported on  05/06/2018) 30 tablet 11  . fentaNYL (DURAGESIC - DOSED MCG/HR) 25 MCG/HR patch Place 1 patch (25 mcg total) onto the skin every 3 (three) days. (Patient not taking: Reported on 04/15/2018) 10 patch 0  . glucose blood (CONTOUR NEXT TEST) test strip Checks sugar 4 times daily. DX E11.9-strips for Contour next EZ meter (Patient not taking: Reported on 05/06/2018) 360 each 3  . Ivermectin (SOOLANTRA) 1 % CREA Apply topically at bedtime. To face    . lactulose (CEPHULAC) 20 g packet Take 1 packet (20 g total) by mouth 2 (two) times daily as needed (constipation). (Patient not taking: Reported on 04/29/2018) 60 each 0  . lidocaine-prilocaine (EMLA) cream Apply 1 application topically as needed. (Patient not taking: Reported on 04/15/2018) 30 g 3  . meclizine (ANTIVERT) 25 MG tablet Take 25 mg by mouth 3 (three) times daily as needed for dizziness.    Marland Kitchen  nystatin cream (MYCOSTATIN) Apply topically 2 (two) times daily. (Patient not taking: Reported on 04/29/2018) 15 g 1  . ondansetron (ZOFRAN) 8 MG tablet Take 1 tablet (8 mg total) by mouth every 8 (eight) hours as needed for nausea or vomiting (if needed beginning 3 days after chemotherapy). (Patient not taking: Reported on 04/15/2018) 20 tablet 0   No current facility-administered medications for this visit.      Review of Systems  Constitutional: Positive for malaise/fatigue and weight loss (down 4 pounds). Negative for diaphoresis and fever.       "I have not had any get up and go".  HENT: Negative.  Negative for nosebleeds, sinus pain and sore throat.        Seasonal allergy symptoms  Eyes: Negative.   Respiratory: Positive for cough (mild; chronic). Negative for hemoptysis, sputum production and shortness of breath.   Cardiovascular: Negative for chest pain, palpitations, orthopnea, leg swelling and PND.       MI status post angioplasty and placement of 5-6 stents (last in 2015).  On 3 antihypertensive medications. (+) OSAH and uses nocturnal PAP  therapy.  Gastrointestinal: Positive for heartburn (on PPI). Negative for abdominal pain, blood in stool, constipation, diarrhea, melena, nausea and vomiting.  Genitourinary: Negative for dysuria, frequency, hematuria and urgency.  Musculoskeletal: Negative for back pain, falls, joint pain and myalgias.  Skin: Negative for itching and rash.  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 Negative for dizziness, tremors, weakness and headaches.  Endo/Heme/Allergies: Bruises/bleeds easily.       Poorly controlled diabetes; CBGs running between 350 and 412  Psychiatric/Behavioral: Negative for depression, memory loss and suicidal ideas. The patient is not nervous/anxious and does not have insomnia.   All other systems reviewed and are negative.  Performance status (ECOG): 2 - Symptomatic, <50% confined to bed  Vital signs BP 126/79 (BP Location: Left Arm, Patient Position: Sitting)   Pulse (!) 58   Temp 97.7 F (36.5 C) (Tympanic)   Resp 18   Ht '5\' 8"'$  (1.727 m)   Wt 172 lb 9.6 oz (78.3 kg)   SpO2 97%   BMI 26.24 kg/m   Physical Exam  Constitutional: He is oriented to person, place, and time and well-developed, well-nourished, and in no distress.  HENT:  Head: Normocephalic and atraumatic.  Mouth/Throat: Oropharynx is clear and moist and mucous membranes are normal.  Gray hair.  Eyes: Pupils are equal, round, and reactive to light. Conjunctivae and EOM are normal. No scleral icterus.  Brown eyes.  Neck: Normal range of motion. Neck supple. No tracheal deviation present. No thyromegaly present.  Cardiovascular: Normal rate, regular rhythm, normal heart sounds and intact distal pulses. Exam reveals no gallop and no friction rub.  No murmur heard. Pulmonary/Chest: Effort normal and breath sounds normal. No respiratory distress. He has no wheezes. He has no rales.  Abdominal: Soft. Bowel sounds are normal. He exhibits no distension. There is no tenderness.   Musculoskeletal: Normal range of motion. He exhibits no edema or tenderness.  Lymphadenopathy:    He has no cervical adenopathy.    He has no axillary adenopathy.       Right: No inguinal and no supraclavicular adenopathy present.       Left: No inguinal and no supraclavicular adenopathy present.  Neurological: He is alert and oriented to person, place, and time.  Skin: Skin is warm and dry. No rash noted. No erythema.  Psychiatric: Mood, affect and judgment normal.  Nursing note  and vitals reviewed.   Infusion on 05/06/2018  Component Date Value Ref Range Status  . Magnesium 05/06/2018 1.9  1.7 - 2.4 mg/dL Final   Performed at [facility], [address] [place], [place] [address]  . Sodium 05/06/2018 138  135 - 145 mmol/L Final  . Potassium 05/06/2018 4.0  3.5 - 5.1 mmol/L Final  . Chloride 05/06/2018 106  98 - 111 mmol/L Final  . CO2 05/06/2018 27  22 - 32 mmol/L Final  . Glucose, Bld 05/06/2018 157* 70 - 99 mg/dL Final  . BUN 05/06/2018 11  8 - 23 mg/dL Final  . Creatinine, Ser 05/06/2018 0.67  0.61 - 1.24 mg/dL Final  . Calcium 05/06/2018 9.5  8.9 - 10.3 mg/dL Final  . Total Protein 05/06/2018 7.0  6.5 - 8.1 g/dL Final  . Albumin 05/06/2018 3.8  3.5 - 5.0 g/dL Final  . AST 05/06/2018 24  15 - 41 U/L Final  . ALT 05/06/2018 23  0 - 44 U/L Final  . Alkaline Phosphatase 05/06/2018 80  38 - 126 U/L Final  . Total Bilirubin 05/06/2018 0.7  0.3 - 1.2 mg/dL Final  . GFR calc non Af Amer 05/06/2018 >60  >60 mL/min Final  . GFR calc Af Amer 05/06/2018 >60  >60 mL/min Final   Comment: (NOTE) The eGFR has been calculated using the CKD EPI equation. This calculation has not been validated in all clinical situations. eGFR's persistently <60 mL/min signify possible Chronic Kidney Disease.   Georgiann Hahn gap 05/06/2018 5  5 - 15 Final   Performed at [facility], [address] [place], [place] [address]  . WBC 05/06/2018 2.6* 4.0 - 10.5 K/uL Final  . RBC 05/06/2018 3.16*  4.22 - 5.81 MIL/uL Final  . Hemoglobin 05/06/2018 9.8* 13.0 - 17.0 g/dL Final  . HCT 05/06/2018 30.2* 39.0 - 52.0 % Final  . MCV 05/06/2018 95.6  80.0 - 100.0 fL Final  . MCH 05/06/2018 31.0  26.0 - 34.0 pg Final  . MCHC 05/06/2018 32.5  30.0 - 36.0 g/dL Final  . RDW 05/06/2018 15.1  11.5 - 15.5 % Final  . Platelets 05/06/2018 211  150 - 400 K/uL Final  . nRBC 05/06/2018 0.0  0.0 - 0.2 % Final  . Neutrophils Relative % 05/06/2018 65  % Final  . Neutro Abs 05/06/2018 1.7  1.7 - 7.7 K/uL Final  . Lymphocytes Relative 05/06/2018 24  % Final  . Lymphs Abs 05/06/2018 0.6* 0.7 - 4.0 K/uL Final  . Monocytes Relative 05/06/2018 9  % Final  . Monocytes Absolute 05/06/2018 0.2  0.1 - 1.0 K/uL Final  . Eosinophils Relative 05/06/2018 1  % Final  . Eosinophils Absolute 05/06/2018 0.0  0.0 - 0.5 K/uL Final  . Basophils Relative 05/06/2018 1  % Final  . Basophils Absolute 05/06/2018 0.0  0.0 - 0.1 K/uL Final  . Immature Granulocytes 05/06/2018 0  % Final  . Abs Immature Granulocytes 05/06/2018 0.01  0.00 - 0.07 K/uL Final   Performed at [facility], [address], [place], [place] [address]    Assessment:  Patrick Mcgee is a 73 y.o. 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  He presented with abdominal pain and weight loss.  Abdomen and pelvic CT on 12/01/2017 revealed a suspicious enhancing, filling defect involving the lower pole collecting system of the left kidney with asymmetric hypoenhancement of the inferior pole cortex of left kidney noted. Findings  were worrisome for urothelial neoplasm.  There was abdominal and pelvic adenopathy compatible with nodal metastasis.  There was a 2.6 cm right para celiac node, 1,6 cm left retroperitoneal node, 1.4 cm right retroperitoneal node, 2.1 cm right common iliac node and 1 cm left external iliac node.  There was aortic atherosclerosis, kidney cysts, and prostate gland enlargement.  Chest CT on  12/07/2017 revealed no evidence of metastatic disease.    He underwent left ureteroscopy with renal pelvis biopsy and left ureteral stent placement on 12/10/2017.  Findings included a large nodular, vascular tumor within the left lower pole collecting system completely obstructing the entire left lower pole moiety.  Ureter, right kidney, and bladder was normal.  Pathology revealed small fragments of high-grade urothelial carcinoma with a small focus of invasion.  He is day 8 of cycle #7 carboplatin + gemcitabine (12/21/2017 - 04/29/2018). Day 8 of cycle #5 held due to low platelets and upcoming travel plans. He is tolerating treatment well.   Chest, abdomen, and pelvic CT on 02/22/2018 revealed marked improvement in nodal metastatic disease in the abdomen/pelvis. There continued to be enhancing material similar to prior in the lower pole collecting system on the left, with hypoenhancement of the subtended parenchyma in the lower third of the left kidney suggesting poor urinary drainage from underlying tumor.  There was no new metastatic lesions are identified.   He has B12 deficiency.  B12 was 247 on 12/31/2017.  He began oral B12 on 01/07/2018.  B12 was 983 on 02/11/2018.  He has diabetes and coronary artery disease s/p MI in 1995.  He is s/p stent placement x 5-6.  Colonoscopy in 2018 was negative.  He has chemotherapy induced anemia requiring Procrit injections every 2 weeks to support his counts (last 04/22/2018).   Symptomatically, patient with low energy.  He notes that he has not had any "get up and go".  Patient denies any significant symptoms following his last treatment beyond the fatigue.  He has seasonal allergy related symptoms.  No B symptoms or recent infections.  Exam is grossly unchanged from previous.  WBC 2600 ([place] 1700).  Hemoglobin 9.8, hematocrit 30.2, and platelets 211,000.  Glucose is 157 mg/dL.  Ferritin is normal at 175 ng/mL.  Plan: 1. Labs today:  CBC with diff, CMP, Mg,  ferritin 2. Metastatic urothelial carcinoma Doing well overall. Tolerating treatments with minimal side effects. Labs reviewed. Blood counts stable and adequate enough for treatment. Will proceed with day 8 of cycle #7 gemcitabine. Discuss premedications with ondansetron alone.  No steroids. Reviewed plans for interval imaging Pulmonary medicine has ordered a high-resolution chest CT for 05/31/2018. CT imaging of the abdomen pelvis to be obtained at the same time. Discuss symptom management.  Patient has antiemetics and pain medications at home to use on a PRN basis. Patient  advising that the  prescribed interventions are adequate at this point. Continue all medications as previously prescribed.  3. Chemotherapy-induced anemia  Labs reviewed.  Hemoglobin 9.8, hematocrit 30.2, MCV 95.6, and platelets 211,000.  Ferritin normal at 175 ng/mL.  Receives Procrit if hemoglobin < 10 (last received 04/22/2018).  Procrit today, then continue labs and +/-Procrit injection every 2 weeks. 4. Constipation  Bowels are managed on current regimen. 5. Cough  Chronic.  Patient has an interstitial lung disease diagnosis.  High-resolution chest CT scheduled for 05/31/2018.    Scheduled follow-up with pulmonary medicine Ashby Dawes, MD) following the CT scan. 6. Hypomagnesemia  Magnesium stable at 1.9 mg/dL.  Continue Mag-Ox  400 mg every other day as previously prescribed. 7. B12 deficiency  B12 level improving on oral supplementation.  Continue oral B12 1000 mcg supplementation daily. 8. Influenza prophylaxis  Patient requesting annual influenza vaccination. Discussed risks and benefits of vaccination.   Blood counts reviewed and found to be adequate enough for patient to proceed with vaccination.   Orders placed for Fluzone high dose vaccination to be administered on 05/13/2018. 9.   RTC on 05/10/2018 for labs (CBC with differential) and +/- GCSF. 10.   RTC on 05/13/2018 for labs (CBC  with differential) and influenza vaccination. 11.   RTC every 2 weeks for labs (CBC) and +/- Procrit injection.  12.   RTC on 05/20/2018  for MD assessment, labs (CBC with diffl, CMP, Mg), day 1of cycle #8 carboplatin + gemcitabine, and Procrit injection   Honor Loh, NP  05/06/18, 2:25 PM   I saw and evaluated the patient, participating in the key portions of the service and reviewing pertinent diagnostic studies and records.  I reviewed the nurse practitioner's note and agree with the findings and the plan.  The assessment and plan were discussed with the patient.  Multiple questions were asked by the patient and answered.   Nolon Stalls, MD 05/06/18, 2:25 PM

## 2018-05-06 NOTE — Progress Notes (Signed)
No new changes noted today 

## 2018-05-10 ENCOUNTER — Inpatient Hospital Stay: Payer: Medicare Other | Attending: Hematology and Oncology

## 2018-05-10 ENCOUNTER — Other Ambulatory Visit: Payer: Self-pay

## 2018-05-10 ENCOUNTER — Inpatient Hospital Stay: Payer: Medicare Other

## 2018-05-10 DIAGNOSIS — E1140 Type 2 diabetes mellitus with diabetic neuropathy, unspecified: Secondary | ICD-10-CM | POA: Diagnosis not present

## 2018-05-10 DIAGNOSIS — L299 Pruritus, unspecified: Secondary | ICD-10-CM | POA: Insufficient documentation

## 2018-05-10 DIAGNOSIS — Z7984 Long term (current) use of oral hypoglycemic drugs: Secondary | ICD-10-CM | POA: Insufficient documentation

## 2018-05-10 DIAGNOSIS — Z79899 Other long term (current) drug therapy: Secondary | ICD-10-CM | POA: Insufficient documentation

## 2018-05-10 DIAGNOSIS — Z5111 Encounter for antineoplastic chemotherapy: Secondary | ICD-10-CM | POA: Diagnosis not present

## 2018-05-10 DIAGNOSIS — D6481 Anemia due to antineoplastic chemotherapy: Secondary | ICD-10-CM | POA: Diagnosis not present

## 2018-05-10 DIAGNOSIS — Z87891 Personal history of nicotine dependence: Secondary | ICD-10-CM | POA: Diagnosis not present

## 2018-05-10 DIAGNOSIS — E1165 Type 2 diabetes mellitus with hyperglycemia: Secondary | ICD-10-CM | POA: Diagnosis not present

## 2018-05-10 DIAGNOSIS — C778 Secondary and unspecified malignant neoplasm of lymph nodes of multiple regions: Secondary | ICD-10-CM | POA: Insufficient documentation

## 2018-05-10 DIAGNOSIS — I2510 Atherosclerotic heart disease of native coronary artery without angina pectoris: Secondary | ICD-10-CM | POA: Diagnosis not present

## 2018-05-10 DIAGNOSIS — E538 Deficiency of other specified B group vitamins: Secondary | ICD-10-CM | POA: Insufficient documentation

## 2018-05-10 DIAGNOSIS — C689 Malignant neoplasm of urinary organ, unspecified: Secondary | ICD-10-CM

## 2018-05-10 DIAGNOSIS — C642 Malignant neoplasm of left kidney, except renal pelvis: Secondary | ICD-10-CM | POA: Diagnosis not present

## 2018-05-10 DIAGNOSIS — I10 Essential (primary) hypertension: Secondary | ICD-10-CM | POA: Diagnosis not present

## 2018-05-10 DIAGNOSIS — J209 Acute bronchitis, unspecified: Secondary | ICD-10-CM | POA: Diagnosis not present

## 2018-05-10 LAB — CBC WITH DIFFERENTIAL/PLATELET
Abs Immature Granulocytes: 0.01 10*3/uL (ref 0.00–0.07)
Basophils Absolute: 0 10*3/uL (ref 0.0–0.1)
Basophils Relative: 1 %
Eosinophils Absolute: 0.1 10*3/uL (ref 0.0–0.5)
Eosinophils Relative: 2 %
HCT: 29 % — ABNORMAL LOW (ref 39.0–52.0)
Hemoglobin: 9.6 g/dL — ABNORMAL LOW (ref 13.0–17.0)
Immature Granulocytes: 0 %
Lymphocytes Relative: 21 %
Lymphs Abs: 0.6 10*3/uL — ABNORMAL LOW (ref 0.7–4.0)
MCH: 31.5 pg (ref 26.0–34.0)
MCHC: 33.1 g/dL (ref 30.0–36.0)
MCV: 95.1 fL (ref 80.0–100.0)
Monocytes Absolute: 0.1 10*3/uL (ref 0.1–1.0)
Monocytes Relative: 3 %
Neutro Abs: 2 10*3/uL (ref 1.7–7.7)
Neutrophils Relative %: 73 %
Platelets: 96 10*3/uL — ABNORMAL LOW (ref 150–400)
RBC: 3.05 MIL/uL — ABNORMAL LOW (ref 4.22–5.81)
RDW: 14.8 % (ref 11.5–15.5)
WBC: 2.8 10*3/uL — ABNORMAL LOW (ref 4.0–10.5)
nRBC: 0 % (ref 0.0–0.2)

## 2018-05-11 MED ORDER — INFLUENZA VAC SPLIT HIGH-DOSE 0.5 ML IM SUSY: 0.5000 mL | PREFILLED_SYRINGE | Freq: Once | INTRAMUSCULAR | Status: AC

## 2018-05-13 ENCOUNTER — Inpatient Hospital Stay: Payer: Medicare Other

## 2018-05-13 DIAGNOSIS — C642 Malignant neoplasm of left kidney, except renal pelvis: Secondary | ICD-10-CM | POA: Diagnosis not present

## 2018-05-13 DIAGNOSIS — E1165 Type 2 diabetes mellitus with hyperglycemia: Secondary | ICD-10-CM | POA: Diagnosis not present

## 2018-05-13 DIAGNOSIS — C778 Secondary and unspecified malignant neoplasm of lymph nodes of multiple regions: Secondary | ICD-10-CM | POA: Diagnosis not present

## 2018-05-13 DIAGNOSIS — I2510 Atherosclerotic heart disease of native coronary artery without angina pectoris: Secondary | ICD-10-CM | POA: Diagnosis not present

## 2018-05-13 DIAGNOSIS — C689 Malignant neoplasm of urinary organ, unspecified: Secondary | ICD-10-CM

## 2018-05-13 DIAGNOSIS — Z23 Encounter for immunization: Secondary | ICD-10-CM

## 2018-05-13 DIAGNOSIS — D6481 Anemia due to antineoplastic chemotherapy: Secondary | ICD-10-CM | POA: Diagnosis not present

## 2018-05-13 DIAGNOSIS — Z5111 Encounter for antineoplastic chemotherapy: Secondary | ICD-10-CM | POA: Diagnosis not present

## 2018-05-13 LAB — CBC WITH DIFFERENTIAL/PLATELET
Abs Immature Granulocytes: 0.02 10*3/uL (ref 0.00–0.07)
Basophils Absolute: 0 10*3/uL (ref 0.0–0.1)
Basophils Relative: 1 %
Eosinophils Absolute: 0 10*3/uL (ref 0.0–0.5)
Eosinophils Relative: 1 %
HCT: 30.6 % — ABNORMAL LOW (ref 39.0–52.0)
Hemoglobin: 10.1 g/dL — ABNORMAL LOW (ref 13.0–17.0)
Immature Granulocytes: 1 %
Lymphocytes Relative: 34 %
Lymphs Abs: 1.1 10*3/uL (ref 0.7–4.0)
MCH: 31.2 pg (ref 26.0–34.0)
MCHC: 33 g/dL (ref 30.0–36.0)
MCV: 94.4 fL (ref 80.0–100.0)
Monocytes Absolute: 0.5 10*3/uL (ref 0.1–1.0)
Monocytes Relative: 16 %
Neutro Abs: 1.6 10*3/uL — ABNORMAL LOW (ref 1.7–7.7)
Neutrophils Relative %: 47 %
Platelets: 70 10*3/uL — ABNORMAL LOW (ref 150–400)
RBC: 3.24 MIL/uL — ABNORMAL LOW (ref 4.22–5.81)
RDW: 15 % (ref 11.5–15.5)
WBC: 3.3 10*3/uL — ABNORMAL LOW (ref 4.0–10.5)
nRBC: 0 % (ref 0.0–0.2)

## 2018-05-20 ENCOUNTER — Inpatient Hospital Stay: Payer: Medicare Other

## 2018-05-20 ENCOUNTER — Telehealth: Payer: Self-pay | Admitting: *Deleted

## 2018-05-20 ENCOUNTER — Ambulatory Visit
Admission: RE | Admit: 2018-05-20 | Discharge: 2018-05-20 | Disposition: A | Discharge status: Home / Self Care | Payer: Medicare Other | Source: Ambulatory Visit | Attending: Hematology and Oncology | Admitting: Hematology and Oncology

## 2018-05-20 ENCOUNTER — Other Ambulatory Visit: Payer: Medicare Other

## 2018-05-20 ENCOUNTER — Ambulatory Visit: Payer: Medicare Other

## 2018-05-20 ENCOUNTER — Inpatient Hospital Stay (HOSPITAL_BASED_OUTPATIENT_CLINIC_OR_DEPARTMENT_OTHER): Payer: Medicare Other | Admitting: Hematology and Oncology

## 2018-05-20 ENCOUNTER — Encounter: Payer: Self-pay | Admitting: Hematology and Oncology

## 2018-05-20 VITALS — BP 171/95 | HR 82 | Temp 99.4°F | Resp 18 | Ht 68.0 in | Wt 171.3 lb

## 2018-05-20 DIAGNOSIS — R05 Cough: Secondary | ICD-10-CM

## 2018-05-20 DIAGNOSIS — D6481 Anemia due to antineoplastic chemotherapy: Secondary | ICD-10-CM | POA: Diagnosis not present

## 2018-05-20 DIAGNOSIS — C778 Secondary and unspecified malignant neoplasm of lymph nodes of multiple regions: Secondary | ICD-10-CM

## 2018-05-20 DIAGNOSIS — L299 Pruritus, unspecified: Secondary | ICD-10-CM | POA: Diagnosis not present

## 2018-05-20 DIAGNOSIS — T451X5A Adverse effect of antineoplastic and immunosuppressive drugs, initial encounter: Secondary | ICD-10-CM

## 2018-05-20 DIAGNOSIS — E1140 Type 2 diabetes mellitus with diabetic neuropathy, unspecified: Secondary | ICD-10-CM | POA: Diagnosis not present

## 2018-05-20 DIAGNOSIS — E1165 Type 2 diabetes mellitus with hyperglycemia: Secondary | ICD-10-CM | POA: Diagnosis not present

## 2018-05-20 DIAGNOSIS — C689 Malignant neoplasm of urinary organ, unspecified: Secondary | ICD-10-CM

## 2018-05-20 DIAGNOSIS — E538 Deficiency of other specified B group vitamins: Secondary | ICD-10-CM | POA: Diagnosis not present

## 2018-05-20 DIAGNOSIS — R509 Fever, unspecified: Secondary | ICD-10-CM | POA: Diagnosis not present

## 2018-05-20 DIAGNOSIS — C642 Malignant neoplasm of left kidney, except renal pelvis: Secondary | ICD-10-CM | POA: Diagnosis not present

## 2018-05-20 DIAGNOSIS — I2510 Atherosclerotic heart disease of native coronary artery without angina pectoris: Secondary | ICD-10-CM | POA: Diagnosis not present

## 2018-05-20 DIAGNOSIS — Z5111 Encounter for antineoplastic chemotherapy: Secondary | ICD-10-CM | POA: Diagnosis not present

## 2018-05-20 DIAGNOSIS — R21 Rash and other nonspecific skin eruption: Secondary | ICD-10-CM

## 2018-05-20 DIAGNOSIS — R059 Cough, unspecified: Secondary | ICD-10-CM

## 2018-05-20 LAB — COMPREHENSIVE METABOLIC PANEL
ALT: 20 U/L (ref 0–44)
AST: 24 U/L (ref 15–41)
Albumin: 3.9 g/dL (ref 3.5–5.0)
Alkaline Phosphatase: 81 U/L (ref 38–126)
Anion gap: 8 (ref 5–15)
BUN: 11 mg/dL (ref 8–23)
CO2: 24 mmol/L (ref 22–32)
Calcium: 9 mg/dL (ref 8.9–10.3)
Chloride: 103 mmol/L (ref 98–111)
Creatinine, Ser: 0.79 mg/dL (ref 0.61–1.24)
GFR calc Af Amer: >60 mL/min (ref >60)
GFR calc non Af Amer: >60 mL/min (ref >60)
Glucose, Bld: 172 mg/dL — ABNORMAL HIGH (ref 70–99)
Potassium: 4.1 mmol/L (ref 3.5–5.1)
Sodium: 135 mmol/L (ref 135–145)
Total Bilirubin: 0.9 mg/dL (ref 0.3–1.2)
Total Protein: 7.1 g/dL (ref 6.5–8.1)

## 2018-05-20 LAB — CBC WITH DIFFERENTIAL/PLATELET
Abs Immature Granulocytes: 0.02 10*3/uL (ref 0.00–0.07)
Basophils Absolute: 0 10*3/uL (ref 0.0–0.1)
Basophils Relative: 1 %
Eosinophils Absolute: 0.2 10*3/uL (ref 0.0–0.5)
Eosinophils Relative: 4 %
HCT: 31.8 % — ABNORMAL LOW (ref 39.0–52.0)
Hemoglobin: 10.5 g/dL — ABNORMAL LOW (ref 13.0–17.0)
Immature Granulocytes: 1 %
Lymphocytes Relative: 22 %
Lymphs Abs: 1 10*3/uL (ref 0.7–4.0)
MCH: 31.5 pg (ref 26.0–34.0)
MCHC: 33 g/dL (ref 30.0–36.0)
MCV: 95.5 fL (ref 80.0–100.0)
Monocytes Absolute: 0.9 10*3/uL (ref 0.1–1.0)
Monocytes Relative: 20 %
Neutro Abs: 2.3 10*3/uL (ref 1.7–7.7)
Neutrophils Relative %: 52 %
Platelets: 155 10*3/uL (ref 150–400)
RBC: 3.33 MIL/uL — ABNORMAL LOW (ref 4.22–5.81)
RDW: 16.3 % — ABNORMAL HIGH (ref 11.5–15.5)
WBC: 4.4 10*3/uL (ref 4.0–10.5)
nRBC: 0 % (ref 0.0–0.2)

## 2018-05-20 LAB — MAGNESIUM: Magnesium: 1.8 mg/dL (ref 1.7–2.4)

## 2018-05-20 MED ORDER — HYDROXYZINE HCL 25 MG PO TABS: 25.0000 mg | ORAL_TABLET | Freq: Three times a day (TID) | ORAL | 0 refills | Status: DC | PRN

## 2018-05-20 MED ORDER — SODIUM CHLORIDE 0.9% FLUSH
10.0000 mL | Freq: Once | INTRAVENOUS | Status: AC
  Administered 2018-05-20: 10 mL via INTRAVENOUS
  Filled 2018-05-20: qty 10

## 2018-05-20 MED ORDER — HEPARIN SOD (PORK) LOCK FLUSH 100 UNIT/ML IV SOLN
500.0000 [IU] | Freq: Once | INTRAVENOUS | Status: AC
  Administered 2018-05-20: 500 [IU] via INTRAVENOUS
  Filled 2018-05-20: qty 5

## 2018-05-20 MED ORDER — AZITHROMYCIN 250 MG PO TABS: ORAL_TABLET | ORAL | 0 refills | Status: DC

## 2018-05-20 NOTE — Progress Notes (Signed)
[facility] day:  05/20/2018   Chief Complaint: Patrick Mcgee is a 72 y.o. male with metastatic urothelial carcinoma who is seen for assessment prior to day 1 of cycle #8 carboplatin + gemcitabine.  HPI:  The patient was last seen in the medical oncology clinic on 05/06/2018.  At that time, he described low energy.  He noted no "get up and go".  Patient denies any significant symptoms following his last treatment beyond the fatigue.  Exam was grossly unchanged from previous.  WBC was 2600 ([place] 1700).  Hemoglobin 9.8, hematocrit 30.2, and platelets 211,000.  Glucose was 157 mg/dL.  Ferritin was normal at 175 ng/mL.  He received day 8 gemcitabine.  Counts on 05/13/2018 revealed a hematocrit of 30.6, hemoglobin 10.1, MCV 94.4, platelets 70,000, WBC 3300 with an ANC of 1600.  During the interim, he has felt itchy x 5 days.  He states that every year, he experiences pruritus "when the heat comes out".  He also notes cough x 2 days.  He has a sore throat.  He has a low grade fever.   Past Medical History:  Diagnosis Date  . Acid reflux   . Anxiety   . Depression   . Diabetes mellitus without complication ([place])   . Hyperlipidemia   . Hypertension   . Myocardial infarction ([place]) 1995  . Sleep apnea   . Urothelial cancer ([place]) 11/2017   Left Urothelial mass, chemo tx's    Past Surgical History:  Procedure Laterality Date  . CATARACT EXTRACTION Left   . COLONOSCOPY  2010   [facility]  . COLONOSCOPY WITH PROPOFOL N/A 10/04/2016   Procedure: COLONOSCOPY WITH PROPOFOL;  Surgeon: [name], MD;  Location: [facility] ENDOSCOPY;  Service: Endoscopy;  Laterality: N/A;  . [place], 2000, 2001, 2014  . CYSTOSCOPY W/ RETROGRADES Bilateral 12/10/2017   Procedure: CYSTOSCOPY WITH RETROGRADE PYELOGRAM;  Surgeon: [name], MD;  Location: [facility] ORS;  Service: Urology;  Laterality: Bilateral;  . CYSTOSCOPY WITH STENT  PLACEMENT Left 12/10/2017   Procedure: CYSTOSCOPY WITH STENT PLACEMENT;  Surgeon: [name], MD;  Location: [facility] ORS;  Service: Urology;  Laterality: Left;  . INGUINAL HERNIA REPAIR Right 08/09/2015   Procedure: HERNIA REPAIR INGUINAL ADULT;  Surgeon: [name], MD;  Location: [facility] ORS;  Service: General;  Laterality: Right;  . NASAL SINUS SURGERY    . nuclear stress test    . PORTA CATH INSERTION N/A 12/19/2017   Procedure: PORTA CATH INSERTION;  Surgeon: [name], MD;  Location: [facility] LAB;  Service: Cardiovascular;  Laterality: N/A;  . URETERAL BIOPSY Left 12/10/2017   Procedure: URETERAL & renal PELVIS BIOPSY;  Surgeon: [name], MD;  Location: [facility] ORS;  Service: Urology;  Laterality: Left;  . URETEROSCOPY Left 12/10/2017   Procedure: URETEROSCOPY;  Surgeon: [name], MD;  Location: [facility] ORS;  Service: Urology;  Laterality: Left;    Family History  Problem Relation Age of Onset  . Heart disease Mother   . Cancer Father        Lung and colon cancer  . Heart disease Father   . Emphysema Maternal Grandfather   . Tuberculosis Maternal Grandmother     Social History:  reports that he has quit smoking. His smoking use included cigars. He has quit using smokeless tobacco.  His smokeless tobacco use included chew. He reports that he drank alcohol. He reports that he does  not use drugs.  Patient is retired from the [employer]. Patient denies known exposures to radiation on toxins. The patient has a family trip planned to [facility] between 03/30/2018 - 04/06/2018. The patient is accompanied by his wife, [name], today.  Allergies:  Allergies  Allergen Reactions  . Sulfa Antibiotics Other (See Comments)    Joint pain  . Ace Inhibitors Cough  . Invokana [Canagliflozin] Other (See Comments)    Leg pain  . Penicillins Rash    Has patient had a PCN reaction causing immediate rash, facial/tongue/throat swelling, SOB or lightheadedness with hypotension:  Yes Has patient had a PCN reaction causing severe rash involving mucus membranes or skin necrosis: No Has patient had a PCN reaction that required hospitalization: No Has patient had a PCN reaction occurring within the last 10 years: No If all of the above answers are "NO", then may proceed with Cephalosporin use.    Current Medications: Current Outpatient Medications  Medication Sig Dispense Refill  . atorvastatin (LIPITOR) 40 MG tablet Take 1 tablet (40 mg total) by mouth at bedtime. 90 tablet 3  . carvedilol (COREG) 12.5 MG tablet TAKE ONE TABLET BY MOUTH TWICE DAILY 180 tablet 3  . feeding supplement, ENSURE ENLIVE, (ENSURE ENLIVE) LIQD Take 237 mLs by mouth 3 (three) times daily between meals. 90 Bottle 0  . fluticasone (FLONASE) 50 MCG/ACT nasal spray Place 2 sprays into both nostrils daily. 16 g 12  . losartan (COZAAR) 100 MG tablet TAKE ONE TABLET BY MOUTH ONCE DAILY 90 tablet 3  . metFORMIN (GLUCOPHAGE) 1000 MG tablet Take 1 tablet (1,000 mg total) by mouth 2 (two) times daily with a meal. 180 tablet 3  . acetaminophen (TYLENOL) 500 MG tablet Take 500 mg by mouth every 6 (six) hours as needed for moderate pain. When taking oxycodone 5 mg tablets    . ALPRAZolam (XANAX) 0.25 MG tablet Take 1 tablet (0.25 mg total) by mouth 3 (three) times daily as needed for anxiety. (Patient not taking: Reported on 04/15/2018) 20 tablet 0  . clobetasol cream (TEMOVATE) 5.36 % Apply 1 application topically as needed (for skin rash). 30 g 1  . doxycycline (VIBRA-TABS) 100 MG tablet Take 1 tablet (100 mg total) by mouth 2 (two) times daily. 20 tablet 0  . empagliflozin (JARDIANCE) 10 MG TABS tablet Take 10 mg by mouth daily. 30 tablet 11  . glucose blood (CONTOUR NEXT TEST) test strip Checks sugar 4 times daily. DX E11.9-strips for Contour next EZ meter 360 each 3  . hydrOXYzine (ATARAX/VISTARIL) 25 MG tablet Take 1 tablet (25 mg total) by mouth every 8 (eight) hours as needed for itching. 30 tablet 0  .  Ivermectin (SOOLANTRA) 1 % CREA Apply topically at bedtime. To face    . lidocaine-prilocaine (EMLA) cream Apply 1 application topically as needed. 30 g 3  . nystatin cream (MYCOSTATIN) Apply topically 2 (two) times daily. 15 g 1  . ondansetron (ZOFRAN) 8 MG tablet Take 1 tablet (8 mg total) by mouth every 8 (eight) hours as needed for nausea or vomiting (if needed beginning 3 days after chemotherapy). 20 tablet 0   No current facility-administered medications for this visit.    Facility-Administered Medications Ordered in Other Visits  Medication Dose Route Frequency Provider Last Rate Last Dose  . Influenza vac split quadrivalent PF (FLUZONE HIGH-DOSE) injection 0.5 mL  0.5 mL Intramuscular Once Karen Kitchens, NP        Review of Systems  Constitutional: Positive for  fever, malaise/fatigue and weight loss (1 pound). Negative for chills and diaphoresis.       Feels sick today.  HENT: Positive for sore throat. Negative for congestion, ear discharge, ear pain, nosebleeds, sinus pain and tinnitus.   Eyes: Negative.  Negative for blurred vision, double vision, photophobia, pain, discharge and redness.  Respiratory: Positive for cough (mild; chronic). Negative for hemoptysis, sputum production and shortness of breath.   Cardiovascular: Negative for chest pain, palpitations, orthopnea, leg swelling and PND.       MI s/p angioplasty and placement of 5-6 stents; on 3 antihypertensive medications. (+) OSAH- uses nocturnal PAP therapy.  Gastrointestinal: Positive for heartburn (on PPI). Negative for abdominal pain, blood in stool, constipation, diarrhea, melena, nausea and vomiting.  Genitourinary: Negative.  Negative for dysuria, frequency, hematuria and urgency.  Musculoskeletal: Negative.  Negative for back pain, falls, joint pain and myalgias.  Skin: Positive for itching. Negative for rash.  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 Negative for dizziness, tingling,  tremors, speech change, focal weakness, weakness and headaches.  Endo/Heme/Allergies: Bruises/bleeds easily.       Diabetes  Psychiatric/Behavioral: Negative for depression and memory loss. The patient is not nervous/anxious and does not have insomnia.   All other systems reviewed and are negative.  Performance status (ECOG): 2 - Symptomatic, <50% confined to bed  Vital signs BP (!) 171/95 (Patient Position: Sitting)   Pulse 82   Temp 99.4 F (37.4 C) (Tympanic)   Resp 18   Ht _0  (1.727 m)   Wt 171 lb 4.8 oz (77.7 kg)   SpO2 98%   BMI 26.05 kg/m   Physical Exam  Constitutional: He is oriented to person, place, and time and well-developed, well-nourished, and in no distress. No distress.  HENT:  Head: Normocephalic and atraumatic.  Mouth/Throat: Oropharynx is clear and moist and mucous membranes are normal. No oropharyngeal exudate.  Gray hair.  Hoarse.  Wearing a mask.  Eyes: Pupils are equal, round, and reactive to light. Conjunctivae and EOM are normal. Right eye exhibits no discharge. Left eye exhibits no discharge. No scleral icterus.  Brown eyes.  Neck: Normal range of motion. Neck supple. No JVD present.  Cardiovascular: Normal rate, regular rhythm, normal heart sounds and intact distal pulses. Exam reveals no gallop and no friction rub.  No murmur heard. Pulmonary/Chest: Effort normal and breath sounds normal. No respiratory distress. He has no wheezes. He has no rales.  Abdominal: Soft. Bowel sounds are normal. He exhibits no distension and no mass. There is no tenderness. There is no rebound and no guarding.  Musculoskeletal: Normal range of motion. He exhibits no edema or tenderness.  Lymphadenopathy:    He has no cervical adenopathy.    He has no axillary adenopathy.       Right: No inguinal and no supraclavicular adenopathy present.       Left: No inguinal and no supraclavicular adenopathy present.  Neurological: He is alert and oriented to person, place, and  time. Gait normal.  Skin: No rash noted. He is not diaphoretic. No erythema.  Faint erythema.  Psychiatric: Mood, affect and judgment normal.  Nursing note and vitals reviewed.   Infusion on 05/20/2018  Component Date Value Ref Range Status  . Magnesium 05/20/2018 1.8  1.7 - 2.4 mg/dL Final   Performed at [facility], [address], [place], [place] [address]  . Sodium 05/20/2018 135  135 - 145 mmol/L Final  . Potassium 05/20/2018 4.1  3.5 - 5.1 mmol/L Final  . Chloride 05/20/2018 103  98 - 111 mmol/L Final  . CO2 05/20/2018 24  22 - 32 mmol/L Final  . Glucose, Bld 05/20/2018 172* 70 - 99 mg/dL Final  . BUN 05/20/2018 11  8 - 23 mg/dL Final  . Creatinine, Ser 05/20/2018 0.79  0.61 - 1.24 mg/dL Final  . Calcium 05/20/2018 9.0  8.9 - 10.3 mg/dL Final  . Total Protein 05/20/2018 7.1  6.5 - 8.1 g/dL Final  . Albumin 05/20/2018 3.9  3.5 - 5.0 g/dL Final  . AST 05/20/2018 24  15 - 41 U/L Final  . ALT 05/20/2018 20  0 - 44 U/L Final  . Alkaline Phosphatase 05/20/2018 81  38 - 126 U/L Final  . Total Bilirubin 05/20/2018 0.9  0.3 - 1.2 mg/dL Final  . GFR calc non Af Amer 05/20/2018 >60  >60 mL/min Final  . GFR calc Af Amer 05/20/2018 >60  >60 mL/min Final   Comment: (NOTE) The eGFR has been calculated using the CKD EPI equation. This calculation has not been validated in all clinical situations. eGFR's persistently <60 mL/min signify possible Chronic Kidney Disease.   Georgiann Hahn gap 05/20/2018 8  5 - 15 Final   Performed at [facility], [address] [place], [place] [address]  . WBC 05/20/2018 4.4  4.0 - 10.5 K/uL Final  . RBC 05/20/2018 3.33* 4.22 - 5.81 MIL/uL Final  . Hemoglobin 05/20/2018 10.5* 13.0 - 17.0 g/dL Final  . HCT 05/20/2018 31.8* 39.0 - 52.0 % Final  . MCV 05/20/2018 95.5  80.0 - 100.0 fL Final  . MCH 05/20/2018 31.5  26.0 - 34.0 pg Final  . MCHC 05/20/2018 33.0  30.0 - 36.0 g/dL Final  . RDW 05/20/2018 16.3* 11.5 - 15.5 % Final  . Platelets  05/20/2018 155  150 - 400 K/uL Final  . nRBC 05/20/2018 0.0  0.0 - 0.2 % Final  . Neutrophils Relative % 05/20/2018 52  % Final  . Neutro Abs 05/20/2018 2.3  1.7 - 7.7 K/uL Final  . Lymphocytes Relative 05/20/2018 22  % Final  . Lymphs Abs 05/20/2018 1.0  0.7 - 4.0 K/uL Final  . Monocytes Relative 05/20/2018 20  % Final  . Monocytes Absolute 05/20/2018 0.9  0.1 - 1.0 K/uL Final  . Eosinophils Relative 05/20/2018 4  % Final  . Eosinophils Absolute 05/20/2018 0.2  0.0 - 0.5 K/uL Final  . Basophils Relative 05/20/2018 1  % Final  . Basophils Absolute 05/20/2018 0.0  0.0 - 0.1 K/uL Final  . Immature Granulocytes 05/20/2018 1  % Final  . Abs Immature Granulocytes 05/20/2018 0.02  0.00 - 0.07 K/uL Final   Performed at [facility], [address] [place], [place] [address]    Assessment:  Patrick Mcgee is a 72 y.o. 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  He presented with abdominal pain and weight loss.  Abdomen and pelvic CT on 12/01/2017 revealed a suspicious enhancing, filling defect involving the lower pole collecting system of the left kidney with asymmetric hypoenhancement of the inferior pole cortex of left kidney noted. Findings were worrisome for urothelial neoplasm.  There was abdominal and pelvic adenopathy compatible with nodal metastasis.  There was a 2.6 cm right para celiac node, 1,6 cm left retroperitoneal node, 1.4 cm right retroperitoneal node, 2.1 cm right common iliac node and 1 cm left external iliac node.  There was aortic atherosclerosis, kidney cysts,  and prostate gland enlargement.  Chest CT on 12/07/2017 revealed no evidence of metastatic disease.    He underwent left ureteroscopy with renal pelvis biopsy and left ureteral stent placement on 12/10/2017.  Findings included a large nodular, vascular tumor within the left lower pole collecting system completely obstructing the entire left lower pole moiety.  Ureter,  right kidney, and bladder was normal.  Pathology revealed small fragments of high-grade urothelial carcinoma with a small focus of invasion.  He is day 1 of cycle #8 carboplatin + gemcitabine (12/21/2017 - 05/20/2018). Day 8 of cycle #5 held due to low platelets and upcoming travel plans. He is tolerating treatment well.   Chest, abdomen, and pelvic CT on 02/22/2018 revealed marked improvement in nodal metastatic disease in the abdomen/pelvis. There continued to be enhancing material similar to prior in the lower pole collecting system on the left, with hypoenhancement of the subtended parenchyma in the lower third of the left kidney suggesting poor urinary drainage from underlying tumor.  There was no new metastatic lesions are identified.   He has B12 deficiency.  B12 was 247 on 12/31/2017.  He began oral B12 on 01/07/2018.  B12 was 983 on 02/11/2018.  He has diabetes and coronary artery disease s/p MI in 1995.  He is s/p stent placement x 5-6.  Colonoscopy in 2018 was negative.  He has chemotherapy induced anemia requiring Procrit injections every 2 weeks to support his counts (last 04/22/2018).   Symptomatically, he feels sick today.  He has a low grade fever, sore throat, and chronic cough.  He has a 5 day history of pruritus.  Plan: 1. Labs today:  CBC with diff, CMP, Mg. 2. Metastatic urothelial carcinoma:  Postpone chemotherapy today secondary to illness.  Reassess next week with plan to initiate cycle #8 if clinically doing well.  Discuss plans for restaging (high-resolution chest CT + abdomen and pelvis CT) on 05/31/2018.  Discuss symptom management.  He has antiemetics and pain medications at home to use on a prn bases.  Interventions are adequate.    3. Chemotherapy-induced anemia:  Hemoglobin 10.5, hematocrit 31.8, MCV 95.5. Ferritin 175 on 05/06/2018. Continue Procrit if hemoglobin < 10 (last received 05/06/2018). 4. Cough and fever:  CXR (PA and lateral)  today. 5. Hypomagnesemia: Magnesium stable at 1.8. Continue Mag-Ox 400 mg QOD. 6. B12 deficiency: Continue oral B12 1000 mcg daily. 7.   Rash and pruritus:  Etiology unclear.  Patient notes prior history.  Rx:  hydroxyzine 25 mg po q 8 hors prn pruritus. 8.   RTC in 1 week for MD assessment, labs (CBC with diff, CMP, Mg), and day 1 of cycle #8 carboplatin and gemcitabine.   [name], MD  05/20/18, 6:16 PM

## 2018-05-20 NOTE — Progress Notes (Signed)
The patient c/o no sleep due to coughing x 2 days and itchy x 5 days

## 2018-05-20 NOTE — Progress Notes (Signed)
Patient c/o cough, sore throat.  Temp 99.1. Patient wearing mask.

## 2018-05-20 NOTE — Telephone Encounter (Signed)
Called patient's wife and LVM that patient does not have pneumonia.  Does have bronchitis.  Dr. [name] has sent Z-Pak prescription to pharmacy.  Advised patient to call if they have questions or concerns.

## 2018-05-23 ENCOUNTER — Ambulatory Visit (INDEPENDENT_AMBULATORY_CARE_PROVIDER_SITE_OTHER): Payer: Medicare Other | Admitting: Family Medicine

## 2018-05-23 ENCOUNTER — Encounter: Payer: Self-pay | Admitting: Family Medicine

## 2018-05-23 VITALS — BP 122/64 | HR 58 | Temp 97.5°F | Resp 16 | Wt 171.4 lb

## 2018-05-23 DIAGNOSIS — I2510 Atherosclerotic heart disease of native coronary artery without angina pectoris: Secondary | ICD-10-CM | POA: Diagnosis not present

## 2018-05-23 DIAGNOSIS — J0100 Acute maxillary sinusitis, unspecified: Secondary | ICD-10-CM

## 2018-05-23 MED ORDER — DOXYCYCLINE HYCLATE 100 MG PO TABS: 100.0000 mg | ORAL_TABLET | Freq: Two times a day (BID) | ORAL | 0 refills | Status: DC

## 2018-05-23 NOTE — Progress Notes (Signed)
  Subjective:     Patient ID: Patrick Mcgee, male   DOB: 1946/02/26, 72 y.o.   MRN: [id number] Chief Complaint  Patient presents with  . Sore Throat    Patient comes in office today with complaints of sore throat and left ear pain for one week.    HPI States he developed cold sx one week ago. Was seen by his oncologist 11/11 and placed on Z-pack for bronchitis (CXR normal). He has just started another round of chemotherapy for urothelial cancer. Patient reports increased sinus pressure, purulent sinus drainage andpost nasal drainage. Cough has improved.  Review of Systems     Objective:   Physical Exam  Constitutional: He appears well-developed and well-nourished. No distress.  Ears: T.M's intact, dull but without inflammation Sinuses: mild maxillary sinus tenderness Throat: no tonsillar enlargement or exudate Neck: no cervical adenopathy Lungs: Bibasilar coarse crackles c/w hx of pulmonary fibrosis.     Assessment:    1. Acute non-recurrent maxillary sinusitis - doxycycline (VIBRA-TABS) 100 MG tablet; Take 1 tablet (100 mg total) by mouth 2 (two) times daily.  Dispense: 20 tablet; Refill: 0    Plan:    Discussed use of Mucinex and Sudafed PE.

## 2018-05-23 NOTE — Patient Instructions (Signed)
Add Mucinex and Sudafed PE for sinus congestion.

## 2018-05-27 ENCOUNTER — Inpatient Hospital Stay (HOSPITAL_BASED_OUTPATIENT_CLINIC_OR_DEPARTMENT_OTHER): Payer: Medicare Other | Admitting: Hematology and Oncology

## 2018-05-27 ENCOUNTER — Inpatient Hospital Stay: Payer: Medicare Other

## 2018-05-27 ENCOUNTER — Encounter: Payer: Self-pay | Admitting: Hematology and Oncology

## 2018-05-27 VITALS — BP 198/94 | HR 60 | Temp 96.5°F | Resp 18 | Wt 171.1 lb

## 2018-05-27 VITALS — BP 163/85 | HR 58

## 2018-05-27 DIAGNOSIS — C778 Secondary and unspecified malignant neoplasm of lymph nodes of multiple regions: Secondary | ICD-10-CM

## 2018-05-27 DIAGNOSIS — I2510 Atherosclerotic heart disease of native coronary artery without angina pectoris: Secondary | ICD-10-CM | POA: Diagnosis not present

## 2018-05-27 DIAGNOSIS — Z5111 Encounter for antineoplastic chemotherapy: Secondary | ICD-10-CM

## 2018-05-27 DIAGNOSIS — D6481 Anemia due to antineoplastic chemotherapy: Secondary | ICD-10-CM

## 2018-05-27 DIAGNOSIS — I10 Essential (primary) hypertension: Secondary | ICD-10-CM | POA: Diagnosis not present

## 2018-05-27 DIAGNOSIS — J209 Acute bronchitis, unspecified: Secondary | ICD-10-CM

## 2018-05-27 DIAGNOSIS — E1165 Type 2 diabetes mellitus with hyperglycemia: Secondary | ICD-10-CM | POA: Diagnosis not present

## 2018-05-27 DIAGNOSIS — C642 Malignant neoplasm of left kidney, except renal pelvis: Secondary | ICD-10-CM

## 2018-05-27 DIAGNOSIS — E1140 Type 2 diabetes mellitus with diabetic neuropathy, unspecified: Secondary | ICD-10-CM | POA: Diagnosis not present

## 2018-05-27 DIAGNOSIS — Z87891 Personal history of nicotine dependence: Secondary | ICD-10-CM

## 2018-05-27 DIAGNOSIS — E538 Deficiency of other specified B group vitamins: Secondary | ICD-10-CM

## 2018-05-27 DIAGNOSIS — R05 Cough: Secondary | ICD-10-CM | POA: Diagnosis not present

## 2018-05-27 DIAGNOSIS — D701 Agranulocytosis secondary to cancer chemotherapy: Secondary | ICD-10-CM

## 2018-05-27 DIAGNOSIS — T451X5A Adverse effect of antineoplastic and immunosuppressive drugs, initial encounter: Secondary | ICD-10-CM

## 2018-05-27 LAB — COMPREHENSIVE METABOLIC PANEL
ALT: 12 U/L (ref 0–44)
AST: 19 U/L (ref 15–41)
Albumin: 3.5 g/dL (ref 3.5–5.0)
Alkaline Phosphatase: 87 U/L (ref 38–126)
Anion gap: 7 (ref 5–15)
BUN: 14 mg/dL (ref 8–23)
CO2: 27 mmol/L (ref 22–32)
Calcium: 9.6 mg/dL (ref 8.9–10.3)
Chloride: 104 mmol/L (ref 98–111)
Creatinine, Ser: 0.66 mg/dL (ref 0.61–1.24)
GFR calc Af Amer: >60 mL/min (ref >60)
GFR calc non Af Amer: >60 mL/min (ref >60)
Glucose, Bld: 178 mg/dL — ABNORMAL HIGH (ref 70–99)
Potassium: 4.1 mmol/L (ref 3.5–5.1)
Sodium: 138 mmol/L (ref 135–145)
Total Bilirubin: 0.6 mg/dL (ref 0.3–1.2)
Total Protein: 7 g/dL (ref 6.5–8.1)

## 2018-05-27 LAB — CBC WITH DIFFERENTIAL/PLATELET
Abs Immature Granulocytes: 0.02 10*3/uL (ref 0.00–0.07)
Basophils Absolute: 0 10*3/uL (ref 0.0–0.1)
Basophils Relative: 1 %
Eosinophils Absolute: 0.3 10*3/uL (ref 0.0–0.5)
Eosinophils Relative: 5 %
HCT: 30.3 % — ABNORMAL LOW (ref 39.0–52.0)
Hemoglobin: 10.1 g/dL — ABNORMAL LOW (ref 13.0–17.0)
Immature Granulocytes: 0 %
Lymphocytes Relative: 27 %
Lymphs Abs: 1.5 10*3/uL (ref 0.7–4.0)
MCH: 31.1 pg (ref 26.0–34.0)
MCHC: 33.3 g/dL (ref 30.0–36.0)
MCV: 93.2 fL (ref 80.0–100.0)
Monocytes Absolute: 0.8 10*3/uL (ref 0.1–1.0)
Monocytes Relative: 14 %
Neutro Abs: 3 10*3/uL (ref 1.7–7.7)
Neutrophils Relative %: 53 %
Platelets: 282 10*3/uL (ref 150–400)
RBC: 3.25 MIL/uL — ABNORMAL LOW (ref 4.22–5.81)
RDW: 15.1 % (ref 11.5–15.5)
WBC: 5.6 10*3/uL (ref 4.0–10.5)
nRBC: 0 % (ref 0.0–0.2)

## 2018-05-27 LAB — MAGNESIUM: Magnesium: 1.7 mg/dL (ref 1.7–2.4)

## 2018-05-27 MED ORDER — HEPARIN SOD (PORK) LOCK FLUSH 100 UNIT/ML IV SOLN
500.0000 [IU] | Freq: Once | INTRAVENOUS | Status: AC | PRN
  Administered 2018-05-27: 500 [IU]
  Filled 2018-05-27 (×2): qty 5

## 2018-05-27 MED ORDER — SODIUM CHLORIDE 0.9 % IV SOLN
Freq: Once | INTRAVENOUS | Status: AC
  Administered 2018-05-27: 10:00:00 via INTRAVENOUS
  Filled 2018-05-27: qty 250

## 2018-05-27 MED ORDER — SODIUM CHLORIDE 0.9 % IV SOLN
1400.0000 mg | Freq: Once | INTRAVENOUS | Status: AC
  Administered 2018-05-27: 1400 mg via INTRAVENOUS
  Filled 2018-05-27: qty 37

## 2018-05-27 MED ORDER — SODIUM CHLORIDE 0.9 % IV SOLN
366.4000 mg | Freq: Once | INTRAVENOUS | Status: AC
  Administered 2018-05-27: 370 mg via INTRAVENOUS
  Filled 2018-05-27: qty 37

## 2018-05-27 MED ORDER — PALONOSETRON HCL INJECTION 0.25 MG/5ML
0.2500 mg | Freq: Once | INTRAVENOUS | Status: AC
  Administered 2018-05-27: 0.25 mg via INTRAVENOUS
  Filled 2018-05-27: qty 5

## 2018-05-27 MED ORDER — DEXAMETHASONE SODIUM PHOSPHATE 10 MG/ML IJ SOLN
10.0000 mg | Freq: Once | INTRAMUSCULAR | Status: DC
  Filled 2018-05-27: qty 1

## 2018-05-27 NOTE — Progress Notes (Signed)
[facility] day:  05/27/2018   Chief Complaint: Patrick Mcgee is a 72 y.o. male with metastatic urothelial carcinoma who is seen for assessment prior to day 1 of cycle #8 carboplatin + gemcitabine.  HPI:  The patient was last seen in the medical oncology clinic on 05/20/2018.  At that time, he noted significant pruritus (seasonal).  He had a cough x 2 days.  CXR revealed no active cardiopulmonary disease.  He was felt to have bronchitis.  He was prescribed a Z-pack and hydroxyzine.  Chemotherapy was postponed.  During the interim, patient has completed a course of azithromycin for his bronchitis. He was not better following the antibiotics. He was seen by PCP and prescribed a 10 day course of doxycycline on 05/23/2018. Patient notes that his congestion and sore throat has improved with the addition of the second antibiotic. Patient denies that he has experienced any B symptoms. He states, "I feel the best that I have felt in the last few days or so".   Patient presents to clinic today with a blood pressure of 198/94. He denies any over the counter decongestants. Patient advising that blood pressure is elevated because he "doesn't do 8:00 in the morning appointments", citing the fact that having to get up early stresses him out.   Patient advises that he maintains an adequate appetite. He is eating well. Weight today is 171 lb 2 oz (77.6 kg), which compared to his last visit to the clinic, represents a stable weight.   Patient denies pain in the clinic today.   Past Medical History:  Diagnosis Date  . Acid reflux   . Anxiety   . Depression   . Diabetes mellitus without complication ([place])   . Hyperlipidemia   . Hypertension   . Myocardial infarction ([place]) 1995  . Sleep apnea   . Urothelial cancer ([place]) 11/2017   Left Urothelial mass, chemo tx's    Past Surgical History:  Procedure Laterality Date  . CATARACT EXTRACTION Left   . COLONOSCOPY   2010   [facility]  . COLONOSCOPY WITH PROPOFOL N/A 10/04/2016   Procedure: COLONOSCOPY WITH PROPOFOL;  Surgeon: [name], MD;  Location: [facility] ENDOSCOPY;  Service: Endoscopy;  Laterality: N/A;  . [place], 2000, 2001, 2014  . CYSTOSCOPY W/ RETROGRADES Bilateral 12/10/2017   Procedure: CYSTOSCOPY WITH RETROGRADE PYELOGRAM;  Surgeon: [name], MD;  Location: [facility] ORS;  Service: Urology;  Laterality: Bilateral;  . CYSTOSCOPY WITH STENT PLACEMENT Left 12/10/2017   Procedure: CYSTOSCOPY WITH STENT PLACEMENT;  Surgeon: [name], MD;  Location: [facility] ORS;  Service: Urology;  Laterality: Left;  . INGUINAL HERNIA REPAIR Right 08/09/2015   Procedure: HERNIA REPAIR INGUINAL ADULT;  Surgeon: [name], MD;  Location: [facility] ORS;  Service: General;  Laterality: Right;  . NASAL SINUS SURGERY    . nuclear stress test    . PORTA CATH INSERTION N/A 12/19/2017   Procedure: PORTA CATH INSERTION;  Surgeon: [name], MD;  Location: [facility];  Service: Cardiovascular;  Laterality: N/A;  . URETERAL BIOPSY Left 12/10/2017   Procedure: URETERAL & renal PELVIS BIOPSY;  Surgeon: [name], MD;  Location: [facility] ORS;  Service: Urology;  Laterality: Left;  . URETEROSCOPY Left 12/10/2017   Procedure: URETEROSCOPY;  Surgeon: [name], MD;  Location: [facility] ORS;  Service: Urology;  Laterality: Left;    Family History  Problem Relation Age of Onset  .  Heart disease Mother   . Cancer Father        Lung and colon cancer  . Heart disease Father   . Emphysema Maternal Grandfather   . Tuberculosis Maternal Grandmother     Social History:  reports that he has quit smoking. His smoking use included cigars. He has quit using smokeless tobacco.  His smokeless tobacco use included chew. He reports that he drank alcohol. He reports that he does not use drugs.  Patient is retired from the [employer]. Patient denies known exposures to radiation on  toxins. The patient has a family trip planned to [facility] between 03/30/2018 - 04/06/2018. The patient is accompanied by his wife, [name], today.  Allergies:  Allergies  Allergen Reactions  . Sulfa Antibiotics Other (See Comments)    Joint pain  . Ace Inhibitors Cough  . Invokana [Canagliflozin] Other (See Comments)    Leg pain  . Penicillins Rash    Has patient had a PCN reaction causing immediate rash, facial/tongue/throat swelling, SOB or lightheadedness with hypotension: Yes Has patient had a PCN reaction causing severe rash involving mucus membranes or skin necrosis: No Has patient had a PCN reaction that required hospitalization: No Has patient had a PCN reaction occurring within the last 10 years: No If all of the above answers are "NO", then may proceed with Cephalosporin use.    Current Medications: Current Outpatient Medications  Medication Sig Dispense Refill  . acetaminophen (TYLENOL) 500 MG tablet Take 500 mg by mouth every 6 (six) hours as needed for moderate pain. When taking oxycodone 5 mg tablets    . atorvastatin (LIPITOR) 40 MG tablet Take 1 tablet (40 mg total) by mouth at bedtime. 90 tablet 3  . carvedilol (COREG) 12.5 MG tablet TAKE ONE TABLET BY MOUTH TWICE DAILY 180 tablet 3  . clobetasol cream (TEMOVATE) 3.97 % Apply 1 application topically as needed (for skin rash). 30 g 1  . doxycycline (VIBRA-TABS) 100 MG tablet Take 1 tablet (100 mg total) by mouth 2 (two) times daily. 20 tablet 0  . empagliflozin (JARDIANCE) 10 MG TABS tablet Take 10 mg by mouth daily. 30 tablet 11  . feeding supplement, ENSURE ENLIVE, (ENSURE ENLIVE) LIQD Take 237 mLs by mouth 3 (three) times daily between meals. 90 Bottle 0  . fluticasone (FLONASE) 50 MCG/ACT nasal spray Place 2 sprays into both nostrils daily. 16 g 12  . glucose blood (CONTOUR NEXT TEST) test strip Checks sugar 4 times daily. DX E11.9-strips for Contour next EZ meter 360 each 3  . hydrOXYzine (ATARAX/VISTARIL) 25 MG  tablet Take 1 tablet (25 mg total) by mouth every 8 (eight) hours as needed for itching. 30 tablet 0  . Ivermectin (SOOLANTRA) 1 % CREA Apply topically at bedtime. To face    . lidocaine-prilocaine (EMLA) cream Apply 1 application topically as needed. 30 g 3  . losartan (COZAAR) 100 MG tablet TAKE ONE TABLET BY MOUTH ONCE DAILY 90 tablet 3  . metFORMIN (GLUCOPHAGE) 1000 MG tablet Take 1 tablet (1,000 mg total) by mouth 2 (two) times daily with a meal. 180 tablet 3  . nystatin cream (MYCOSTATIN) Apply topically 2 (two) times daily. 15 g 1  . ondansetron (ZOFRAN) 8 MG tablet Take 1 tablet (8 mg total) by mouth every 8 (eight) hours as needed for nausea or vomiting (if needed beginning 3 days after chemotherapy). 20 tablet 0  . ALPRAZolam (XANAX) 0.25 MG tablet Take 1 tablet (0.25 mg total) by mouth 3 (three)  times daily as needed for anxiety. (Patient not taking: Reported on 04/15/2018) 20 tablet 0   No current facility-administered medications for this visit.    Facility-Administered Medications Ordered in Other Visits  Medication Dose Route Frequency Provider Last Rate Last Dose  . Influenza vac split quadrivalent PF (FLUZONE HIGH-DOSE) injection 0.5 mL  0.5 mL Intramuscular Once Karen Kitchens, NP         Review of Systems  Constitutional: Negative for diaphoresis, fever, malaise/fatigue and weight loss (stable).  HENT: Positive for congestion (improved) and sore throat (improved).   Eyes: Negative.   Respiratory: Positive for cough (chronic; improved). Negative for hemoptysis, sputum production and shortness of breath.        PMH (+) for OSAH syndrome - uses nocturnal PAP therapy.   Cardiovascular: Negative for chest pain, palpitations, orthopnea, leg swelling and PND.       HTN; on 3 antihypertensives  Gastrointestinal: Positive for heartburn (on PPI therapy). Negative for abdominal pain, blood in stool, constipation, diarrhea, melena, nausea and vomiting.  Genitourinary: Negative for  dysuria, frequency, hematuria and urgency.  Musculoskeletal: Negative for back pain, falls, joint pain and myalgias.  Skin: Negative for itching and rash.  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 Negative for dizziness, tremors, weakness and headaches.  Endo/Heme/Allergies: Bruises/bleeds easily.       PMH (+) for diabetes; poorly controlled.   Psychiatric/Behavioral: Negative for depression, memory loss and suicidal ideas. The patient is not nervous/anxious and does not have insomnia.   All other systems reviewed and are negative.  Performance status (ECOG): 2 - Symptomatic, <50% confined to bed  Vital signs BP (!) 198/94 (BP Location: Right Arm, Patient Position: Sitting)   Pulse 60   Temp (!) 96.5 F (35.8 C) (Tympanic)   Resp 18   Wt 171 lb 2 oz (77.6 kg)   BMI 26.02 kg/m   Physical Exam  Constitutional: He is oriented to person, place, and time and well-developed, well-nourished, and in no distress. No distress.  HENT:  Head: Normocephalic and atraumatic.  Mouth/Throat: Oropharynx is clear and moist and mucous membranes are normal.  Wearing a cap.  Eyes: Pupils are equal, round, and reactive to light. Conjunctivae and EOM are normal. No scleral icterus.  Neck: Normal range of motion. Neck supple. No JVD present.  Cardiovascular: Normal rate, regular rhythm, normal heart sounds and intact distal pulses. Exam reveals no gallop and no friction rub.  No murmur heard. Pulmonary/Chest: Effort normal and breath sounds normal. No respiratory distress. He has no wheezes. He has no rales.  Abdominal: Soft. Bowel sounds are normal. He exhibits no distension and no mass. There is no tenderness. There is no rebound and no guarding.  Musculoskeletal: Normal range of motion. He exhibits no edema or tenderness.  Lymphadenopathy:    He has no cervical adenopathy.    He has no axillary adenopathy.       Right: No inguinal and no supraclavicular adenopathy  present.       Left: No inguinal and no supraclavicular adenopathy present.  Neurological: He is alert and oriented to person, place, and time. Gait normal.  Skin: Skin is warm and dry. No rash noted. He is not diaphoretic. No erythema.  Psychiatric: Mood, affect and judgment normal.  Nursing note and vitals reviewed.   Infusion on 05/27/2018  Component Date Value Ref Range Status  . Magnesium 05/27/2018 1.7  1.7 - 2.4 mg/dL Final   Performed at [facility], [address] [place], [place] [address]  . Sodium 05/27/2018 138  135 - 145 mmol/L Final  . Potassium 05/27/2018 4.1  3.5 - 5.1 mmol/L Final  . Chloride 05/27/2018 104  98 - 111 mmol/L Final  . CO2 05/27/2018 27  22 - 32 mmol/L Final  . Glucose, Bld 05/27/2018 178* 70 - 99 mg/dL Final  . BUN 05/27/2018 14  8 - 23 mg/dL Final  . Creatinine, Ser 05/27/2018 0.66  0.61 - 1.24 mg/dL Final  . Calcium 05/27/2018 9.6  8.9 - 10.3 mg/dL Final  . Total Protein 05/27/2018 7.0  6.5 - 8.1 g/dL Final  . Albumin 05/27/2018 3.5  3.5 - 5.0 g/dL Final  . AST 05/27/2018 19  15 - 41 U/L Final  . ALT 05/27/2018 12  0 - 44 U/L Final  . Alkaline Phosphatase 05/27/2018 87  38 - 126 U/L Final  . Total Bilirubin 05/27/2018 0.6  0.3 - 1.2 mg/dL Final  . GFR calc non Af Amer 05/27/2018 >60  >60 mL/min Final  . GFR calc Af Amer 05/27/2018 >60  >60 mL/min Final   Comment: (NOTE) The eGFR has been calculated using the CKD EPI equation. This calculation has not been validated in all clinical situations. eGFR's persistently <60 mL/min signify possible Chronic Kidney Disease.   Georgiann Hahn gap 05/27/2018 7  5 - 15 Final   Performed at [facility], [address] [place], [place] [address]  . WBC 05/27/2018 5.6  4.0 - 10.5 K/uL Final  . RBC 05/27/2018 3.25* 4.22 - 5.81 MIL/uL Final  . Hemoglobin 05/27/2018 10.1* 13.0 - 17.0 g/dL Final  . HCT 05/27/2018 30.3* 39.0 - 52.0 % Final  . MCV 05/27/2018 93.2  80.0 - 100.0 fL Final  . MCH 05/27/2018  31.1  26.0 - 34.0 pg Final  . MCHC 05/27/2018 33.3  30.0 - 36.0 g/dL Final  . RDW 05/27/2018 15.1  11.5 - 15.5 % Final  . Platelets 05/27/2018 282  150 - 400 K/uL Final  . nRBC 05/27/2018 0.0  0.0 - 0.2 % Final  . Neutrophils Relative % 05/27/2018 53  % Final  . Neutro Abs 05/27/2018 3.0  1.7 - 7.7 K/uL Final  . Lymphocytes Relative 05/27/2018 27  % Final  . Lymphs Abs 05/27/2018 1.5  0.7 - 4.0 K/uL Final  . Monocytes Relative 05/27/2018 14  % Final  . Monocytes Absolute 05/27/2018 0.8  0.1 - 1.0 K/uL Final  . Eosinophils Relative 05/27/2018 5  % Final  . Eosinophils Absolute 05/27/2018 0.3  0.0 - 0.5 K/uL Final  . Basophils Relative 05/27/2018 1  % Final  . Basophils Absolute 05/27/2018 0.0  0.0 - 0.1 K/uL Final  . Immature Granulocytes 05/27/2018 0  % Final  . Abs Immature Granulocytes 05/27/2018 0.02  0.00 - 0.07 K/uL Final   Performed at [facility], [address] [place], [place] [address]    Assessment:  DUDLEY MAGES is a 72 y.o. 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  He presented with abdominal pain and weight loss.  Abdomen and pelvic CT on 12/01/2017 revealed a suspicious enhancing, filling defect involving the lower pole collecting system of the left kidney with asymmetric hypoenhancement of the inferior pole cortex of left kidney noted. Findings were worrisome for urothelial neoplasm.  There was abdominal and pelvic adenopathy compatible with nodal metastasis.  There was a 2.6 cm right para celiac node, 1,6 cm left retroperitoneal node, 1.4  cm right retroperitoneal node, 2.1 cm right common iliac node and 1 cm left external iliac node.  There was aortic atherosclerosis, kidney cysts, and prostate gland enlargement.  Chest CT on 12/07/2017 revealed no evidence of metastatic disease.    He underwent left ureteroscopy with renal pelvis biopsy and left ureteral stent placement on 12/10/2017.  Findings included a large  nodular, vascular tumor within the left lower pole collecting system completely obstructing the entire left lower pole moiety.  Ureter, right kidney, and bladder was normal.  Pathology revealed small fragments of high-grade urothelial carcinoma with a small focus of invasion.  He is day 1 of cycle #8 carboplatin + gemcitabine (12/21/2017 - 05/27/2018). Day 8 of cycle #5 held due to low platelets and upcoming travel plans. He is tolerating treatment well.   Chest, abdomen, and pelvic CT on 02/22/2018 revealed marked improvement in nodal metastatic disease in the abdomen/pelvis. There continued to be enhancing material similar to prior in the lower pole collecting system on the left, with hypoenhancement of the subtended parenchyma in the lower third of the left kidney suggesting poor urinary drainage from underlying tumor.  There was no new metastatic lesions are identified.   He has B12 deficiency.  B12 was 247 on 12/31/2017.  He began oral B12 on 01/07/2018.  B12 was 983 on 02/11/2018.  He has diabetes and coronary artery disease s/p MI in 1995.  He is s/p stent placement x 5-6.  Colonoscopy in 2018 was negative.  He has chemotherapy induced anemia requiring Procrit injections every 2 weeks to support his counts (last 05/06/2018).   Symptomatically, patient is recovering from recent bronchitis. He completed a course of azithromycin and continues on a 10-day course of doxycycline.  Patient denies any fever. Eating well; weight stable.  WBC 5600 ([place] 3000 ).  Hemoglobin 10.1, hematocrit 30.3, and platelets 282,000.  Glucose elevated at 178 mg/dL.  Plan: 1. Labs today:  CBC with diff, CMP, Mg. 2. Metastatic urothelial carcinoma  Doing well overall with treatments. Minimal chemotherapy related side effects.  Labs reviewed. Blood counts stable and adequate enough for treatment. Will proceed with day 1 cycle #8 carboplatin + gemcitabine.  Discussed medication with antiemetic (Aloxi) alone. Patient  refuses steroids.   Review plans for interval imaging  Pulmonary medicine ordered a high-resolution chest CT for 05/31/2018.  Abdomen and pelvis CT will be obtained at the same time. Patient has antiemetics and pain medications at home to use on a PRN basis, and advises that the prescribed interventions are adequately managing her symptoms at this point. Continue all medications as previously prescribed.  3. Acute bronchitis  Clinically improving.  No fevers or sore throat.  Cough improved.  Has completed a 5-day course of azithromycin, and is currently on a 10-day course of doxycycline. 4. Chemotherapy-induced anemia  Labs reviewed.  Hemoglobin 10.1, hematocrit 30.3, MCV 93.2, and platelets 282,000.  Receives Procrit if hemoglobin < 10 (last received on 05/06/2018).  Continue labs (CBC) and +/- Procrit injections every 2 weeks. 5. Chronic cough  Improved following recent exacerbation secondary to acute bronchitis.  Patient has interstitial lung disease diagnosis.  High-resolution chest CT scheduled for 05/31/2018.  Schedule follow-up with pulmonary medicine Ashby Dawes, MD) following the CT scan. 6. Hypomagnesemia  Magnesium stable at 1.7 mg/dL today.  Continue Mag-Ox 400 mg every other day as previously prescribed. 7. B12 deficiency  B12 level improved on oral supplementation.  Continue oral B12 1000 mg of mentation daily. 8. RTC on 06/03/2018 for MD  assessment, labs (CBC with diff, CMP, magnesium) and day 8 of cycle #8 gemcitabine.   Honor Loh, NP  05/27/18, 6:15 PM   I saw and evaluated the patient, participating in the key portions of the service and reviewing pertinent diagnostic studies and records.  I reviewed the nurse practitioner's note and agree with the findings and the plan.  The assessment and plan were discussed with the patient.  Several questions were asked by the patient and answered.   Nolon Stalls, MD 05/27/18, 6:15 PM

## 2018-05-27 NOTE — Progress Notes (Signed)
Pt in for follow up, reports feeling better than last week. Cough is better and no longer has sore throat.  Completed z pak.

## 2018-05-28 ENCOUNTER — Ambulatory Visit: Payer: Medicare Other | Attending: Internal Medicine

## 2018-05-28 DIAGNOSIS — J849 Interstitial pulmonary disease, unspecified: Secondary | ICD-10-CM | POA: Diagnosis not present

## 2018-05-28 MED ORDER — ALBUTEROL SULFATE (2.5 MG/3ML) 0.083% IN NEBU
2.5000 mg | INHALATION_SOLUTION | Freq: Once | RESPIRATORY_TRACT | Status: AC
  Administered 2018-05-28: 2.5 mg via RESPIRATORY_TRACT
  Filled 2018-05-28: qty 3

## 2018-05-31 ENCOUNTER — Ambulatory Visit: Admission: RE | Admit: 2018-05-31 | Payer: Medicare Other | Source: Ambulatory Visit

## 2018-05-31 ENCOUNTER — Ambulatory Visit
Admission: RE | Admit: 2018-05-31 | Discharge: 2018-05-31 | Disposition: A | Discharge status: Home / Self Care | Payer: Medicare Other | Source: Ambulatory Visit | Attending: Urgent Care | Admitting: Urgent Care

## 2018-05-31 ENCOUNTER — Ambulatory Visit: Payer: Self-pay

## 2018-05-31 ENCOUNTER — Ambulatory Visit
Admission: RE | Admit: 2018-05-31 | Discharge: 2018-05-31 | Disposition: A | Discharge status: Home / Self Care | Payer: Medicare Other | Source: Ambulatory Visit | Attending: Internal Medicine | Admitting: Internal Medicine

## 2018-05-31 ENCOUNTER — Ambulatory Visit: Payer: Medicare Other

## 2018-05-31 DIAGNOSIS — J849 Interstitial pulmonary disease, unspecified: Secondary | ICD-10-CM | POA: Diagnosis not present

## 2018-05-31 DIAGNOSIS — E119 Type 2 diabetes mellitus without complications: Secondary | ICD-10-CM

## 2018-05-31 DIAGNOSIS — C642 Malignant neoplasm of left kidney, except renal pelvis: Secondary | ICD-10-CM | POA: Diagnosis not present

## 2018-05-31 DIAGNOSIS — I10 Essential (primary) hypertension: Secondary | ICD-10-CM

## 2018-05-31 MED ORDER — IOPAMIDOL (ISOVUE-300) INJECTION 61%
100.0000 mL | Freq: Once | INTRAVENOUS | Status: AC | PRN
  Administered 2018-05-31: 100 mL via INTRAVENOUS

## 2018-05-31 NOTE — Chronic Care Management (AMB) (Signed)
  Chronic Care Management   Telephone Outreach Note  05/31/2018 Name: RODRIGO MCGRANAHAN MRN: [id number] DOB: September 20, 1945  Referred by: Health Plan Reason for referral : Chronic Care Management (introduction of services)    I reached out to Mr. JERID CATHERMAN today by phone in response to a referral sent by Mr. Jayln Madeira [facility] health plan.   Was unable to reach patient via telephone today for introduction to the CCM (Chronic Care Management) program and have left HIPAA compliant voicemail asking patient to return my call. (unsuccessful outreach #1).  Plan: Will follow-up within 3-5  business days via telephone.   Dr. [name] will be notified of this outreach.  Aysen Shieh E. Rollene Rotunda, RN, BSN Nurse Care Coordinator [facility] Care Management [phone]

## 2018-06-03 ENCOUNTER — Ambulatory Visit: Payer: Medicare Other

## 2018-06-03 ENCOUNTER — Inpatient Hospital Stay (HOSPITAL_BASED_OUTPATIENT_CLINIC_OR_DEPARTMENT_OTHER): Payer: Medicare Other | Admitting: Hematology and Oncology

## 2018-06-03 ENCOUNTER — Other Ambulatory Visit: Payer: Self-pay

## 2018-06-03 ENCOUNTER — Inpatient Hospital Stay: Payer: Medicare Other

## 2018-06-03 ENCOUNTER — Encounter: Payer: Self-pay | Admitting: Hematology and Oncology

## 2018-06-03 ENCOUNTER — Other Ambulatory Visit: Payer: Medicare Other

## 2018-06-03 VITALS — BP 142/87 | HR 69 | Temp 98.3°F | Resp 16 | Ht 68.0 in | Wt 170.8 lb

## 2018-06-03 DIAGNOSIS — Z7189 Other specified counseling: Secondary | ICD-10-CM

## 2018-06-03 DIAGNOSIS — I119 Hypertensive heart disease without heart failure: Secondary | ICD-10-CM | POA: Diagnosis not present

## 2018-06-03 DIAGNOSIS — K1379 Other lesions of oral mucosa: Secondary | ICD-10-CM

## 2018-06-03 DIAGNOSIS — Z87891 Personal history of nicotine dependence: Secondary | ICD-10-CM

## 2018-06-03 DIAGNOSIS — I2510 Atherosclerotic heart disease of native coronary artery without angina pectoris: Secondary | ICD-10-CM | POA: Diagnosis not present

## 2018-06-03 DIAGNOSIS — R05 Cough: Secondary | ICD-10-CM | POA: Diagnosis not present

## 2018-06-03 DIAGNOSIS — E119 Type 2 diabetes mellitus without complications: Secondary | ICD-10-CM | POA: Diagnosis not present

## 2018-06-03 DIAGNOSIS — C642 Malignant neoplasm of left kidney, except renal pelvis: Secondary | ICD-10-CM

## 2018-06-03 DIAGNOSIS — D6481 Anemia due to antineoplastic chemotherapy: Secondary | ICD-10-CM

## 2018-06-03 DIAGNOSIS — C778 Secondary and unspecified malignant neoplasm of lymph nodes of multiple regions: Secondary | ICD-10-CM | POA: Diagnosis not present

## 2018-06-03 DIAGNOSIS — Z5111 Encounter for antineoplastic chemotherapy: Secondary | ICD-10-CM | POA: Diagnosis not present

## 2018-06-03 DIAGNOSIS — E1165 Type 2 diabetes mellitus with hyperglycemia: Secondary | ICD-10-CM | POA: Diagnosis not present

## 2018-06-03 DIAGNOSIS — E538 Deficiency of other specified B group vitamins: Secondary | ICD-10-CM

## 2018-06-03 DIAGNOSIS — J209 Acute bronchitis, unspecified: Secondary | ICD-10-CM

## 2018-06-03 LAB — COMPREHENSIVE METABOLIC PANEL
ALT: 26 U/L (ref 0–44)
AST: 31 U/L (ref 15–41)
Albumin: 3.7 g/dL (ref 3.5–5.0)
Alkaline Phosphatase: 86 U/L (ref 38–126)
Anion gap: 8 (ref 5–15)
BUN: 17 mg/dL (ref 8–23)
CO2: 28 mmol/L (ref 22–32)
Calcium: 9.7 mg/dL (ref 8.9–10.3)
Chloride: 102 mmol/L (ref 98–111)
Creatinine, Ser: 0.77 mg/dL (ref 0.61–1.24)
GFR calc Af Amer: >60 mL/min (ref >60)
GFR calc non Af Amer: >60 mL/min (ref >60)
Glucose, Bld: 175 mg/dL — ABNORMAL HIGH (ref 70–99)
Potassium: 4.3 mmol/L (ref 3.5–5.1)
Sodium: 138 mmol/L (ref 135–145)
Total Bilirubin: 0.8 mg/dL (ref 0.3–1.2)
Total Protein: 7.2 g/dL (ref 6.5–8.1)

## 2018-06-03 LAB — CBC WITH DIFFERENTIAL/PLATELET
Abs Immature Granulocytes: 0 10*3/uL (ref 0.00–0.07)
Basophils Absolute: 0.1 10*3/uL (ref 0.0–0.1)
Basophils Relative: 2 %
Eosinophils Absolute: 0.2 10*3/uL (ref 0.0–0.5)
Eosinophils Relative: 7 %
HCT: 30.8 % — ABNORMAL LOW (ref 39.0–52.0)
Hemoglobin: 10.2 g/dL — ABNORMAL LOW (ref 13.0–17.0)
Immature Granulocytes: 0 %
Lymphocytes Relative: 37 %
Lymphs Abs: 1.3 10*3/uL (ref 0.7–4.0)
MCH: 30.8 pg (ref 26.0–34.0)
MCHC: 33.1 g/dL (ref 30.0–36.0)
MCV: 93.1 fL (ref 80.0–100.0)
Monocytes Absolute: 0.3 10*3/uL (ref 0.1–1.0)
Monocytes Relative: 9 %
Neutro Abs: 1.6 10*3/uL — ABNORMAL LOW (ref 1.7–7.7)
Neutrophils Relative %: 45 %
Platelets: 189 10*3/uL (ref 150–400)
RBC: 3.31 MIL/uL — ABNORMAL LOW (ref 4.22–5.81)
RDW: 14.6 % (ref 11.5–15.5)
WBC: 3.4 10*3/uL — ABNORMAL LOW (ref 4.0–10.5)
nRBC: 0 % (ref 0.0–0.2)

## 2018-06-03 LAB — MAGNESIUM: Magnesium: 1.7 mg/dL (ref 1.7–2.4)

## 2018-06-03 MED ORDER — SODIUM CHLORIDE 0.9% FLUSH
10.0000 mL | Freq: Once | INTRAVENOUS | Status: AC
  Administered 2018-06-03: 10 mL via INTRAVENOUS
  Filled 2018-06-03: qty 10

## 2018-06-03 MED ORDER — HEPARIN SOD (PORK) LOCK FLUSH 100 UNIT/ML IV SOLN
500.0000 [IU] | Freq: Once | INTRAVENOUS | Status: AC
  Administered 2018-06-03: 500 [IU] via INTRAVENOUS
  Filled 2018-06-03: qty 5

## 2018-06-03 NOTE — Progress Notes (Signed)
No treatment today per MD.  

## 2018-06-03 NOTE — Progress Notes (Signed)
See follow up note. Patient states he feels some tingling in his mouth when using mouthwash.

## 2018-06-03 NOTE — Progress Notes (Signed)
[facility]     Assessment and Plan:  Interstitial lung disease. - Mild interstitial edema seen in the lung bases a CT chest, suspect that this is consistent with early or mild interstitial lung disease. -High-resolution CT chest and PFT in 1 year.  --PPSV23 09/06/11; PCV13 01/29/14. Flu shot up to date.   Obstructive sleep apnea. - Currently doing well on CPAP, new machine in 2018. - Continue to use CPAP every night.  Orders Placed This Encounter  Procedures  . CT CHEST HIGH RESOLUTION  . Pulmonary Function Test [facility] Only   Return in about 1 year (around 06/05/2019).   Date: 06/03/2018  MRN# [id number] Patrick Mcgee 10-21-1945    Patrick Mcgee is a 72 y.o. old male seen in consultation for chief complaint of:    Chief Complaint  Patient presents with  . Shortness of Breath    Pt had PFT last week. He states breathing is stable.  . Sleep Apnea    pt wears cpap nightly at least 5 hrs.    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 He recently had a followup up scan which suggested interstitial edema in the lung bases.  Today he notes that his breathing has been doing well. He has been on an inhaler, he uses flonase. He had an episode of bronchitis with cough which is now doing better.   He has been using CPAP every night and feels that he has been doing well, he is more awake during the day.    **PFT 05/28/2018>> FVC is 73% predicted, FEV1 is 85% predicted, there is no significant improvement bronchodilator.  Ratio is 90%.  Lung volumes TLC is reduced at 64%.  RV is reduced at 70%.  Ratio is normal.  DLCO is reduced at 58% predicted.  Flow volume loop is unremarkable. - Overall this test shows moderate restrictive lung disease, without evidence of significant obstructive lung disease. CT chest 02/22/2018>> images personally reviewed, enlarged right  paratracheal node, cardiomegaly.  Mild increased interstitial markings in the lung bases suggestive of early/mild pulmonary fibrotic changes. Echo 01/04/18>> EF 50%.  Medication:    Current Outpatient Medications:  .  acetaminophen (TYLENOL) 500 MG tablet, Take 500 mg by mouth every 6 (six) hours as needed for moderate pain. When taking oxycodone 5 mg tablets, Disp: , Rfl:  .  ALPRAZolam (XANAX) 0.25 MG tablet, Take 1 tablet (0.25 mg total) by mouth 3 (three) times daily as needed for anxiety., Disp: 20 tablet, Rfl: 0 .  atorvastatin (LIPITOR) 40 MG tablet, Take 1 tablet (40 mg total) by mouth at bedtime., Disp: 90 tablet, Rfl: 3 .  carvedilol (COREG) 12.5 MG tablet, TAKE ONE TABLET BY MOUTH TWICE DAILY, Disp: 180 tablet, Rfl: 3 .  clobetasol cream (TEMOVATE) 0.34 %, Apply 1 application topically as needed (for skin rash)., Disp: 30 g, Rfl: 1 .  doxycycline (VIBRA-TABS) 100 MG tablet, Take 1 tablet (100 mg total) by mouth 2 (two) times daily., Disp: 20 tablet, Rfl: 0 .  empagliflozin (JARDIANCE) 10 MG TABS tablet, Take 10 mg by mouth daily., Disp: 30 tablet, Rfl: 11 .  feeding supplement, ENSURE ENLIVE, (ENSURE ENLIVE) LIQD, Take 237 mLs by mouth 3 (three) times daily between meals., Disp: 90 Bottle, Rfl: 0 .  fluticasone (FLONASE) 50 MCG/ACT nasal spray, Place 2 sprays into both nostrils daily., Disp: 16 g, Rfl: 12 .  glucose  blood (CONTOUR NEXT TEST) test strip, Checks sugar 4 times daily. DX E11.9-strips for Contour next EZ meter, Disp: 360 each, Rfl: 3 .  hydrOXYzine (ATARAX/VISTARIL) 25 MG tablet, Take 1 tablet (25 mg total) by mouth every 8 (eight) hours as needed for itching., Disp: 30 tablet, Rfl: 0 .  Ivermectin (SOOLANTRA) 1 % CREA, Apply topically at bedtime. To face, Disp: , Rfl:  .  lidocaine-prilocaine (EMLA) cream, Apply 1 application topically as needed., Disp: 30 g, Rfl: 3 .  losartan (COZAAR) 100 MG tablet, TAKE ONE TABLET BY MOUTH ONCE DAILY, Disp: 90 tablet, Rfl: 3 .  metFORMIN  (GLUCOPHAGE) 1000 MG tablet, Take 1 tablet (1,000 mg total) by mouth 2 (two) times daily with a meal., Disp: 180 tablet, Rfl: 3 .  nystatin cream (MYCOSTATIN), Apply topically 2 (two) times daily., Disp: 15 g, Rfl: 1 .  ondansetron (ZOFRAN) 8 MG tablet, Take 1 tablet (8 mg total) by mouth every 8 (eight) hours as needed for nausea or vomiting (if needed beginning 3 days after chemotherapy)., Disp: 20 tablet, Rfl: 0 No current facility-administered medications for this visit.   Facility-Administered Medications Ordered in Other Visits:  .  Influenza vac split quadrivalent PF (FLUZONE HIGH-DOSE) injection 0.5 mL, 0.5 mL, Intramuscular, Once, Karen Kitchens, NP   Allergies:  Sulfa antibiotics; Ace inhibitors; Invokana [canagliflozin]; and Penicillins  Review of Systems:  Constitutional: Feels well. Cardiovascular: Denies chest pain, exertional chest pain.  Pulmonary: Denies hemoptysis, pleuritic chest pain.   The remainder of systems were reviewed and were found to be negative other than what is documented in the HPI.    Physical Examination:   VS: BP 112/68 (BP Location: Left Arm, Cuff Size: Normal)   Pulse 74   Resp 16   Ht 5\' 8"  (1.727 m)   Wt 170 lb (77.1 kg)   SpO2 96%   BMI 25.85 kg/m   General Appearance: No distress  Neuro:without focal findings, mental status, speech normal, alert and oriented HEENT: PERRLA, EOM intact Pulmonary: No wheezing, No rales  CardiovascularNormal S1,S2.  No m/r/g.  Abdomen: Benign, Soft, non-tender, No masses Renal:  No costovertebral tenderness  GU:  No performed at this time. Endoc: No evident thyromegaly, no signs of acromegaly or Cushing features Skin:   warm, no rashes, no ecchymosis  Extremities: normal, no cyanosis, clubbing.     LABORATORY PANEL:   CBC Recent Labs  Lab 06/03/18 0821  WBC 3.4*  HGB 10.2*  HCT 30.8*  PLT 189    ------------------------------------------------------------------------------------------------------------------  Chemistries  Recent Labs  Lab 06/03/18 0821  NA 138  K 4.3  CL 102  CO2 28  GLUCOSE 175*  BUN 17  CREATININE 0.77  CALCIUM 9.7  MG 1.7  AST 31  ALT 26  ALKPHOS 86  BILITOT 0.8   ------------------------------------------------------------------------------------------------------------------  Cardiac Enzymes No results for input(s): TROPONINI in the last 168 hours. ------------------------------------------------------------  RADIOLOGY:  No results found.     Thank  you for the consultation and for allowing [facility] to assist in the care of your patient. Our recommendations are noted above.  Please contact us if we can be of further service.   Marda Stalker, M.D., F.C.C.P.  Board Certified in Internal Medicine, Pulmonary Medicine, [place], and Sleep Medicine.  [facility] Office Number: [phone]   06/03/2018

## 2018-06-03 NOTE — Progress Notes (Signed)
[facility] day:  06/03/2018   Chief Complaint: Patrick Mcgee is a 72 y.o. male with metastatic urothelial carcinoma who is seen for assessment prior to day 8 of cycle #8 carboplatin + gemcitabine.  HPI:  The patient was last seen in the medical oncology clinic on 05/27/2018.  At that time, he was recovering from recent bronchitis. He completed a course of azithromycin and continued on a 10-day course of doxycycline.  Patient denied any fever. Eating well; weight stable.  WBC 5600 ([place] 3000 ).  Hemoglobin 10.1, hematocrit 30.3, and platelets 282,000.  Glucose elevated at 178 mg/dL.  He received day 1 of cycle #8 chemotherapy.  He was previously schedule for high resolution chest CT.  Abdomen and pelvic CT were added to complete re-staging.  High resolution chest CT on 05/31/2018 revealed a spectrum of findings compatible with basilar predominant fibrotic interstitial lung disease without honeycombing and without appreciable progression since 12/07/2017 chest CT. Differential includes fibrotic phase nonspecific interstitial pneumonia (NSIP) or usual interstitial pneumonia (UIP). Findings are categorized as probable UIP per consensus guidelines.  There were no findings suspicious for metastatic disease in the chest. There was mild stable paratracheal adenopathy since 12/07/2017 chest CT, favor reactive.  Abdomen and pelvic CT on 05/31/2018 revealed the primary urothelial tumor in the lower left renal collecting system was mildly increased (1.2 x 1.0 to 1.9 x 1.8 cm).  There was increased left para-aortic adenopathy (0.9 cm to 1.3 cm).  Periportal adenopathy was stable (1.4 cm).  There was no additional sites of metastatic disease in the abdomen or pelvis.  During the interim, patient feeling better overall. He has some residual cough and congestion. He remains on course of doxycycline (2 pills remain). He complains of some non-specific soreness in his mouth.  Patient denies that he has experienced any B symptoms. He denies any interval infections.   Patient advises that his blood pressure has been elevated as of late. No chest pain, increased shortness of breath, or palpitations. He denies headaches or visual changes. He remains compliant with the prescribed antihypertensive medications. No new medications.   Patient advises that he maintains an adequate appetite. He is eating well. Weight today is 170 lb 12.8 oz (77.5 kg), which compared to his last visit to the clinic, represents a 1 pound decrease.    Patient denies pain in the clinic today.   Past Medical History:  Diagnosis Date  . Acid reflux   . Anxiety   . Depression   . Diabetes mellitus without complication ([place])   . Hyperlipidemia   . Hypertension   . Myocardial infarction ([place]) 1995  . Sleep apnea   . Urothelial cancer ([place]) 11/2017   Left Urothelial mass, chemo tx's    Past Surgical History:  Procedure Laterality Date  . CATARACT EXTRACTION Left   . COLONOSCOPY  2010   [facility]  . COLONOSCOPY WITH PROPOFOL N/A 10/04/2016   Procedure: COLONOSCOPY WITH PROPOFOL;  Surgeon: [name], MD;  Location: [facility] ENDOSCOPY;  Service: Endoscopy;  Laterality: N/A;  . [place], 2000, 2001, 2014  . CYSTOSCOPY W/ RETROGRADES Bilateral 12/10/2017   Procedure: CYSTOSCOPY WITH RETROGRADE PYELOGRAM;  Surgeon: [name], MD;  Location: [facility] ORS;  Service: Urology;  Laterality: Bilateral;  . CYSTOSCOPY WITH STENT PLACEMENT Left 12/10/2017   Procedure: CYSTOSCOPY WITH STENT PLACEMENT;  Surgeon: [name], MD;  Location: [facility] ORS;  Service: Urology;  Laterality: Left;  .  INGUINAL HERNIA REPAIR Right 08/09/2015   Procedure: HERNIA REPAIR INGUINAL ADULT;  Surgeon: [name], MD;  Location: [facility] ORS;  Service: General;  Laterality: Right;  . NASAL SINUS SURGERY    . nuclear stress test    . PORTA CATH INSERTION N/A 12/19/2017    Procedure: PORTA CATH INSERTION;  Surgeon: [name], MD;  Location: [facility];  Service: Cardiovascular;  Laterality: N/A;  . URETERAL BIOPSY Left 12/10/2017   Procedure: URETERAL & renal PELVIS BIOPSY;  Surgeon: [name], MD;  Location: [facility] ORS;  Service: Urology;  Laterality: Left;  . URETEROSCOPY Left 12/10/2017   Procedure: URETEROSCOPY;  Surgeon: [name], MD;  Location: [facility] ORS;  Service: Urology;  Laterality: Left;    Family History  Problem Relation Age of Onset  . Heart disease Mother   . Cancer Father        Lung and colon cancer  . Heart disease Father   . Emphysema Maternal Grandfather   . Tuberculosis Maternal Grandmother     Social History:  reports that he has quit smoking. His smoking use included cigars. He has quit using smokeless tobacco.  His smokeless tobacco use included chew. He reports that he drank alcohol. He reports that he does not use drugs.  Patient is retired from the [employer]. Patient denies known exposures to radiation on toxins. The patient has a family trip planned to [facility] between 03/30/2018 - 04/06/2018. The patient is accompanied by his wife, Patrick Mcgee, today.  Allergies:  Allergies  Allergen Reactions  . Sulfa Antibiotics Other (See Comments)    Joint pain  . Ace Inhibitors Cough  . Invokana [Canagliflozin] Other (See Comments)    Leg pain  . Penicillins Rash    Has patient had a PCN reaction causing immediate rash, facial/tongue/throat swelling, SOB or lightheadedness with hypotension: Yes Has patient had a PCN reaction causing severe rash involving mucus membranes or skin necrosis: No Has patient had a PCN reaction that required hospitalization: No Has patient had a PCN reaction occurring within the last 10 years: No If all of the above answers are "NO", then may proceed with Cephalosporin use.    Current Medications: Current Outpatient Medications  Medication Sig Dispense Refill  . acetaminophen  (TYLENOL) 500 MG tablet Take 500 mg by mouth every 6 (six) hours as needed for moderate pain. When taking oxycodone 5 mg tablets    . ALPRAZolam (XANAX) 0.25 MG tablet Take 1 tablet (0.25 mg total) by mouth 3 (three) times daily as needed for anxiety. 20 tablet 0  . atorvastatin (LIPITOR) 40 MG tablet Take 1 tablet (40 mg total) by mouth at bedtime. 90 tablet 3  . carvedilol (COREG) 12.5 MG tablet TAKE ONE TABLET BY MOUTH TWICE DAILY 180 tablet 3  . clobetasol cream (TEMOVATE) 1.61 % Apply 1 application topically as needed (for skin rash). 30 g 1  . doxycycline (VIBRA-TABS) 100 MG tablet Take 1 tablet (100 mg total) by mouth 2 (two) times daily. 20 tablet 0  . empagliflozin (JARDIANCE) 10 MG TABS tablet Take 10 mg by mouth daily. 30 tablet 11  . feeding supplement, ENSURE ENLIVE, (ENSURE ENLIVE) LIQD Take 237 mLs by mouth 3 (three) times daily between meals. 90 Bottle 0  . fluticasone (FLONASE) 50 MCG/ACT nasal spray Place 2 sprays into both nostrils daily. 16 g 12  . glucose blood (CONTOUR NEXT TEST) test strip Checks sugar 4 times daily. DX E11.9-strips for Contour next EZ meter 360  each 3  . hydrOXYzine (ATARAX/VISTARIL) 25 MG tablet Take 1 tablet (25 mg total) by mouth every 8 (eight) hours as needed for itching. 30 tablet 0  . Ivermectin (SOOLANTRA) 1 % CREA Apply topically at bedtime. To face    . lidocaine-prilocaine (EMLA) cream Apply 1 application topically as needed. 30 g 3  . losartan (COZAAR) 100 MG tablet TAKE ONE TABLET BY MOUTH ONCE DAILY 90 tablet 3  . metFORMIN (GLUCOPHAGE) 1000 MG tablet Take 1 tablet (1,000 mg total) by mouth 2 (two) times daily with a meal. 180 tablet 3  . nystatin cream (MYCOSTATIN) Apply topically 2 (two) times daily. 15 g 1  . ondansetron (ZOFRAN) 8 MG tablet Take 1 tablet (8 mg total) by mouth every 8 (eight) hours as needed for nausea or vomiting (if needed beginning 3 days after chemotherapy). 20 tablet 0   No current facility-administered medications for  this visit.    Facility-Administered Medications Ordered in Other Visits  Medication Dose Route Frequency Provider Last Rate Last Dose  . heparin lock flush 100 unit/mL  500 Units Intravenous Once Corcoran, Melissa C, MD      . Influenza vac split quadrivalent PF (FLUZONE HIGH-DOSE) injection 0.5 mL  0.5 mL Intramuscular Once Karen Kitchens, NP         Review of Systems  Constitutional: Negative for chills, diaphoresis, fever, malaise/fatigue and weight loss (1 pound).       Feels "pretty good".  HENT: Positive for congestion (little). Negative for ear discharge, ear pain, nosebleeds, sinus pain, sore throat (improved) and tinnitus.   Eyes: Negative.  Negative for blurred vision, double vision, photophobia, pain, discharge and redness.  Respiratory: Positive for cough (chronic; improved). Negative for hemoptysis, sputum production, shortness of breath and wheezing.        Bronchitis, cleared up.  Sleep apnea syndrome - uses nocturnal PAP therapy.  Had PFTs last week.  Cardiovascular: Negative for chest pain, palpitations, orthopnea, leg swelling and PND.       Hypertension on 3 anti-hypertensives  Gastrointestinal: Positive for heartburn (on PPI therapy). Negative for abdominal pain, blood in stool, constipation, diarrhea, melena, nausea and vomiting.  Genitourinary: Negative.  Negative for dysuria, frequency, hematuria and urgency.  Musculoskeletal: Negative.  Negative for back pain, falls, joint pain, myalgias and neck pain.  Skin: Negative.  Negative for itching and rash.  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 Negative for dizziness, tremors, focal weakness, weakness and headaches.  Endo/Heme/Allergies: Bruises/bleeds easily.       Diabetes  Psychiatric/Behavioral: Negative for depression and memory loss. The patient is not nervous/anxious and does not have insomnia.   All other systems reviewed and are negative.  Performance status (ECOG):  1-2  Vital signs BP (!) 142/87 (BP Location: Left Arm, Patient Position: Sitting) Comment: Patient seeing PCP on December 4th  Pulse 69   Temp 98.3 F (36.8 C) (Tympanic)   Resp 16   Ht _0  (1.727 m)   Wt 170 lb 12.8 oz (77.5 kg)   BMI 25.97 kg/m   Physical Exam  Constitutional: He is oriented to person, place, and time and well-developed, well-nourished, and in no distress. No distress.  HENT:  Head: Normocephalic and atraumatic.  Mouth/Throat: Oropharynx is clear and moist and mucous membranes are normal.  White hair.  Eyes: Pupils are equal, round, and reactive to light. Conjunctivae and EOM are normal. No scleral icterus.  Brown eyes.  Neck: Normal range of motion. Neck supple. No  JVD present.  Cardiovascular: Normal rate, regular rhythm, normal heart sounds and intact distal pulses. Exam reveals no gallop and no friction rub.  No murmur heard. Pulmonary/Chest: Effort normal and breath sounds normal. No respiratory distress. He has no wheezes. He has no rales.  Abdominal: Soft. Bowel sounds are normal. He exhibits no distension and no mass. There is no tenderness. There is no rebound and no guarding.  Musculoskeletal: Normal range of motion. He exhibits no edema or tenderness.  Lymphadenopathy:    He has no cervical adenopathy.    He has no axillary adenopathy.       Right: No inguinal and no supraclavicular adenopathy present.       Left: No inguinal and no supraclavicular adenopathy present.  Neurological: He is alert and oriented to person, place, and time. Gait normal.  Skin: Skin is warm and dry. No rash noted. He is not diaphoretic. No erythema.  Psychiatric: Mood, affect and judgment normal.  Nursing note and vitals reviewed.   Infusion on 06/03/2018  Component Date Value Ref Range Status  . Magnesium 06/03/2018 1.7  1.7 - 2.4 mg/dL Final   Performed at [facility], [address], [place], [place] [address]  . Sodium 06/03/2018 138  135 - 145 mmol/L  Final  . Potassium 06/03/2018 4.3  3.5 - 5.1 mmol/L Final  . Chloride 06/03/2018 102  98 - 111 mmol/L Final  . CO2 06/03/2018 28  22 - 32 mmol/L Final  . Glucose, Bld 06/03/2018 175* 70 - 99 mg/dL Final  . BUN 06/03/2018 17  8 - 23 mg/dL Final  . Creatinine, Ser 06/03/2018 0.77  0.61 - 1.24 mg/dL Final  . Calcium 06/03/2018 9.7  8.9 - 10.3 mg/dL Final  . Total Protein 06/03/2018 7.2  6.5 - 8.1 g/dL Final  . Albumin 06/03/2018 3.7  3.5 - 5.0 g/dL Final  . AST 06/03/2018 31  15 - 41 U/L Final  . ALT 06/03/2018 26  0 - 44 U/L Final  . Alkaline Phosphatase 06/03/2018 86  38 - 126 U/L Final  . Total Bilirubin 06/03/2018 0.8  0.3 - 1.2 mg/dL Final  . GFR calc non Af Amer 06/03/2018 >60  >60 mL/min Final  . GFR calc Af Amer 06/03/2018 >60  >60 mL/min Final   Comment: (NOTE) The eGFR has been calculated using the CKD EPI equation. This calculation has not been validated in all clinical situations. eGFR's persistently <60 mL/min signify possible Chronic Kidney Disease.   Georgiann Hahn gap 06/03/2018 8  5 - 15 Final   Performed at [facility], [address] [place], [place] [address]  . WBC 06/03/2018 3.4* 4.0 - 10.5 K/uL Final  . RBC 06/03/2018 3.31* 4.22 - 5.81 MIL/uL Final  . Hemoglobin 06/03/2018 10.2* 13.0 - 17.0 g/dL Final  . HCT 06/03/2018 30.8* 39.0 - 52.0 % Final  . MCV 06/03/2018 93.1  80.0 - 100.0 fL Final  . MCH 06/03/2018 30.8  26.0 - 34.0 pg Final  . MCHC 06/03/2018 33.1  30.0 - 36.0 g/dL Final  . RDW 06/03/2018 14.6  11.5 - 15.5 % Final  . Platelets 06/03/2018 189  150 - 400 K/uL Final  . nRBC 06/03/2018 0.0  0.0 - 0.2 % Final  . Neutrophils Relative % 06/03/2018 45  % Final  . Neutro Abs 06/03/2018 1.6* 1.7 - 7.7 K/uL Final  . Lymphocytes Relative 06/03/2018 37  % Final  . Lymphs Abs 06/03/2018 1.3  0.7 - 4.0 K/uL Final  .  Monocytes Relative 06/03/2018 9  % Final  . Monocytes Absolute 06/03/2018 0.3  0.1 - 1.0 K/uL Final  . Eosinophils Relative 06/03/2018 7  % Final   . Eosinophils Absolute 06/03/2018 0.2  0.0 - 0.5 K/uL Final  . Basophils Relative 06/03/2018 2  % Final  . Basophils Absolute 06/03/2018 0.1  0.0 - 0.1 K/uL Final  . Immature Granulocytes 06/03/2018 0  % Final  . Abs Immature Granulocytes 06/03/2018 0.00  0.00 - 0.07 K/uL Final   Performed at [facility], [address] [place], [place] [address]    Assessment:  Patrick Mcgee is a 72 y.o. 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  He presented with abdominal pain and weight loss.  Abdomen and pelvic CT on 12/01/2017 revealed a suspicious enhancing, filling defect involving the lower pole collecting system of the left kidney with asymmetric hypoenhancement of the inferior pole cortex of left kidney noted. Findings were worrisome for urothelial neoplasm.  There was abdominal and pelvic adenopathy compatible with nodal metastasis.  There was a 2.6 cm right para celiac node, 1,6 cm left retroperitoneal node, 1.4 cm right retroperitoneal node, 2.1 cm right common iliac node and 1 cm left external iliac node.  There was aortic atherosclerosis, kidney cysts, and prostate gland enlargement.  Chest CT on 12/07/2017 revealed no evidence of metastatic disease.    He underwent left ureteroscopy with renal pelvis biopsy and left ureteral stent placement on 12/10/2017.  Findings included a large nodular, vascular tumor within the left lower pole collecting system completely obstructing the entire left lower pole moiety.  Ureter, right kidney, and bladder was normal.  Pathology revealed small fragments of high-grade urothelial carcinoma with a small focus of invasion.  He is day 8 of cycle #8 carboplatin + gemcitabine (12/21/2017 - 05/27/2018). Day 8 of cycle #5 held due to low platelets and upcoming travel plans. He is tolerating treatment well.   Chest, abdomen, and pelvic CT on 02/22/2018 revealed marked improvement in nodal metastatic disease in  the abdomen/pelvis. There continued to be enhancing material similar to prior in the lower pole collecting system on the left, with hypoenhancement of the subtended parenchyma in the lower third of the left kidney suggesting poor urinary drainage from underlying tumor.  There was no new metastatic lesions are identified.   High resolution chest CT on 05/31/2018 revealed a spectrum of findings compatible with basilar predominant fibrotic interstitial lung disease without honeycombing and without appreciable progression. Differential includes fibrotic phase nonspecific interstitial pneumonia (NSIP) or usual interstitial pneumonia (UIP). Findings are categorized as probable UIP per consensus guidelines.  There were no findings suspicious for metastatic disease in the chest. There was mild stable paratracheal adenopathy since 12/07/2017.  Abdomen and pelvic CT on 05/31/2018 revealed the primary urothelial tumor in the lower left renal collecting system was mildly increased (1.2 x 1.0 to 1.9 x 1.8 cm).  There was increased left para-aortic adenopathy (0.9 cm to 1.3 cm).  Periportal adenopathy was stable (1.4 cm).  There was no additional sites of metastatic disease in the abdomen or pelvis.  He has B12 deficiency.  B12 was 247 on 12/31/2017.  He began oral B12 on 01/07/2018.  B12 was 983 on 02/11/2018.  He has diabetes and coronary artery disease s/p MI in 1995.  He is s/p stent placement x 5-6.  Colonoscopy in 2018 was negative.  He has chemotherapy induced anemia requiring Procrit injections every 2 weeks to support  his counts (last 05/06/2018).   Symptomatically, his bronchitis is improving.  He is completing a course of doxycycline.  Exam is stable.  Hemoglobin is 10.2.  Platelet count is 189,000.  WBC 3400 with an ANC of 1600.  Magnesium is 1.7.  Plan: 1. Labs today:  CBC with diff, CMP, Mg. 2. Metastatic urothelial carcinoma Discuss restaging CT scans.  CT scans personally reviewed.  Agree with  radiology interpretation. Discuss early progression.  Patient is asymptomatic.  Discuss switch in therapy to anti-PDL1 therapy (Tecentriq or [place]). Discuss side effects of immunotherapy.  Will need to discuss with pulmonary medicine regarding potential issues with pneumonitis. Discuss initial biopsy and no additional tissue for testing (PDL-1 or Foundation One for gene fusion involving NTRK gene per patient request). Discuss proceeding with second line immunotherapy prior to repeat biopsy. Patient interested in larotrectinib or entrectinib (NTRK inhibitors) if he has the [place] gene mutation over immunotherapy (anti-PDL1). Patient states that he would like a second opinion with Dr. Janese Banks as he will not be going to [place]. 3. Acute bronchitis Clinically improving. 4. Chemotherapy-induced anemia Hemoglobin 10.2. Continue Procrit every 2 weeks to maintain a hemoglobin > 10. 5. Chronic cough High resolution chest CT as noted above. Patient has follow-up with Dr. Alphonsus Sias. 6. Hypomagnesemia Magnesium 1.7. Continue oral magnesium 7. B12 deficiency Continue oral B12. 8. RTC in 2 weeks for MD assessment, labs (CBC with diff, CMP, TSH), and +/- anti-PDL1 therapy.   Honor Loh, NP  06/03/18, 9:28 AM   I saw and evaluated the patient, participating in the key portions of the service and reviewing pertinent diagnostic studies and records.  I reviewed the nurse practitioner's note and agree with the findings and the plan.  The assessment and plan were discussed with the patient.  Multiple questions were asked by the patient and answered.   Nolon Stalls, MD 06/03/18, 9:28 AM

## 2018-06-03 NOTE — Patient Instructions (Signed)
Atezolizumab injection What is this medicine? ATEZOLIZUMAB (a te zoe LIZ ue mab) is a monoclonal antibody. It is used to treat bladder cancer (urothelial cancer) and non-small cell lung cancer. This medicine may be used for other purposes; ask your health care provider or pharmacist if you have questions. COMMON BRAND NAME(S): Tecentriq What should I tell my health care provider before I take this medicine? They need to know if you have any of these conditions: -diabetes -immune system problems -infection -inflammatory bowel disease -liver disease -lung or breathing disease -lupus -nervous system problems like myasthenia gravis or Guillain-Barre syndrome -organ transplant -an unusual or allergic reaction to atezolizumab, other medicines, foods, dyes, or preservatives -pregnant or trying to get pregnant -breast-feeding How should I use this medicine? This medicine is for infusion into a vein. It is given by a health care professional in a hospital or clinic setting. A special MedGuide will be given to you before each treatment. Be sure to read this information carefully each time. Talk to your pediatrician regarding the use of this medicine in children. Special care may be needed. Overdosage: If you think you have taken too much of this medicine contact a poison control center or emergency room at once. NOTE: This medicine is only for you. Do not share this medicine with others. What if I miss a dose? It is important not to miss your dose. Call your doctor or health care professional if you are unable to keep an appointment. What may interact with this medicine? Interactions have not been studied. This list may not describe all possible interactions. Give your health care provider a list of all the medicines, herbs, non-prescription drugs, or dietary supplements you use. Also tell them if you smoke, drink alcohol, or use illegal drugs. Some items may interact with your medicine. What  should I watch for while using this medicine? Your condition will be monitored carefully while you are receiving this medicine. You may need blood work done while you are taking this medicine. Do not become pregnant while taking this medicine or for at least 5 months after stopping it. Women should inform their doctor if they wish to become pregnant or think they might be pregnant. There is a potential for serious side effects to an unborn child. Talk to your health care professional or pharmacist for more information. Do not breast-feed an infant while taking this medicine or for at least 5 months after the last dose. What side effects may I notice from receiving this medicine? Side effects that you should report to your doctor or health care professional as soon as possible: -allergic reactions like skin rash, itching or hives, swelling of the face, lips, or tongue -black, tarry stools -bloody or watery diarrhea -breathing problems -changes in vision -chest pain or chest tightness -chills -facial flushing -fever -headache -signs and symptoms of high blood sugar such as dizziness; dry mouth; dry skin; fruity breath; nausea; stomach pain; increased hunger or thirst; increased urination -signs and symptoms of liver injury like dark yellow or brown urine; general ill feeling or flu-like symptoms; light-colored stools; loss of appetite; nausea; right upper belly pain; unusually weak or tired; yellowing of the eyes or skin -stomach pain -trouble passing urine or change in the amount of urine Side effects that usually do not require medical attention (report to your doctor or health care professional if they continue or are bothersome): -cough -diarrhea -joint pain -muscle pain -muscle weakness -tiredness -weight loss This list may not describe all   possible side effects. Call your doctor for medical advice about side effects. You may report side effects to FDA at 1-800-FDA-1088. Where should  I keep my medicine? This drug is given in a hospital or clinic and will not be stored at home. NOTE: This sheet is a summary. It may not cover all possible information. If you have questions about this medicine, talk to your doctor, pharmacist, or health care provider.  2018 Elsevier/Gold Standard (2015-07-28 17:54:14)  

## 2018-06-04 ENCOUNTER — Encounter: Payer: Self-pay | Admitting: Internal Medicine

## 2018-06-04 ENCOUNTER — Ambulatory Visit (INDEPENDENT_AMBULATORY_CARE_PROVIDER_SITE_OTHER): Payer: Medicare Other | Admitting: Internal Medicine

## 2018-06-04 ENCOUNTER — Ambulatory Visit: Payer: Self-pay

## 2018-06-04 DIAGNOSIS — I10 Essential (primary) hypertension: Secondary | ICD-10-CM

## 2018-06-04 DIAGNOSIS — E119 Type 2 diabetes mellitus without complications: Secondary | ICD-10-CM

## 2018-06-04 DIAGNOSIS — I2510 Atherosclerotic heart disease of native coronary artery without angina pectoris: Secondary | ICD-10-CM | POA: Diagnosis not present

## 2018-06-04 DIAGNOSIS — J849 Interstitial pulmonary disease, unspecified: Secondary | ICD-10-CM

## 2018-06-04 NOTE — Chronic Care Management (AMB) (Signed)
  Chronic Care Management   Note  06/04/2018 Name: Patrick Mcgee MRN: [id number] DOB: [birth year]   [facility] RN CM received an incoming call from [name] in response to left VM message by CM on Friday.    Information about CCM services and an offer to enroll [name] in the CCM program were provided. [name] declined services at this time and has not been enrolled in the CCM program. [name] does plan to discuss with his wife and notify CCM Team for assistance with hight cost meds once he finds out what his plan will cover for 2020.  Plan:  Patrick Mcgee has been provided with the care management team contact information should he be interested in [place] in the future.   Patrick Mcgee., MD has been notified of this outreach and [name] [facility] decision and plan.   Patrick Rabe E. Rollene Rotunda, RN, BSN Nurse Care Coordinator [facility] Care Management [phone]

## 2018-06-04 NOTE — Patient Instructions (Addendum)
Repeat lung function test and CT chest in one year, follow up in 1 year.

## 2018-06-05 ENCOUNTER — Other Ambulatory Visit: Payer: Self-pay | Admitting: Family Medicine

## 2018-06-05 ENCOUNTER — Encounter: Payer: Self-pay | Admitting: Urgent Care

## 2018-06-05 DIAGNOSIS — L299 Pruritus, unspecified: Secondary | ICD-10-CM

## 2018-06-05 MED ORDER — HYDROXYZINE HCL 25 MG PO TABS: 25.0000 mg | ORAL_TABLET | Freq: Three times a day (TID) | ORAL | 3 refills | Status: AC | PRN

## 2018-06-05 NOTE — Telephone Encounter (Signed)
Absolutely.

## 2018-06-05 NOTE — Telephone Encounter (Signed)
Pt contacted office for refill request on the following medications:  hydrOXYzine (ATARAX/VISTARIL) 25 MG tablet   [facility] [address]  Pt stated that his oncologist started him on this medication and is requesting Dr. Rosanna Randy refill the medication. Please advise. Thanks TNP

## 2018-06-05 NOTE — Telephone Encounter (Signed)
Rx sent to pharmacy   

## 2018-06-05 NOTE — Telephone Encounter (Signed)
Please advise refill? 

## 2018-06-09 ENCOUNTER — Other Ambulatory Visit: Payer: Self-pay | Admitting: Family Medicine

## 2018-06-12 ENCOUNTER — Ambulatory Visit: Payer: Self-pay | Admitting: Family Medicine

## 2018-06-13 ENCOUNTER — Ambulatory Visit: Payer: Self-pay | Admitting: Family Medicine

## 2018-06-13 ENCOUNTER — Telehealth: Payer: Self-pay | Admitting: Oncology

## 2018-06-13 ENCOUNTER — Other Ambulatory Visit: Payer: Self-pay

## 2018-06-13 DIAGNOSIS — C642 Malignant neoplasm of left kidney, except renal pelvis: Secondary | ICD-10-CM

## 2018-06-13 NOTE — Telephone Encounter (Signed)
Transfer of Care from Dr. [name] to Dr. Janese Banks. Dx METASTATIC UROTHELIAL. Appt conf w patient.

## 2018-06-14 ENCOUNTER — Other Ambulatory Visit: Payer: Self-pay

## 2018-06-14 ENCOUNTER — Encounter: Payer: Self-pay | Admitting: Oncology

## 2018-06-14 ENCOUNTER — Inpatient Hospital Stay: Payer: Medicare Other | Attending: Oncology | Admitting: Oncology

## 2018-06-14 VITALS — BP 143/81 | HR 62 | Temp 94.2°F | Resp 18 | Wt 175.2 lb

## 2018-06-14 DIAGNOSIS — C642 Malignant neoplasm of left kidney, except renal pelvis: Secondary | ICD-10-CM | POA: Diagnosis not present

## 2018-06-14 DIAGNOSIS — Z5112 Encounter for antineoplastic immunotherapy: Secondary | ICD-10-CM | POA: Insufficient documentation

## 2018-06-14 DIAGNOSIS — Z79899 Other long term (current) drug therapy: Secondary | ICD-10-CM | POA: Diagnosis not present

## 2018-06-14 DIAGNOSIS — R05 Cough: Secondary | ICD-10-CM | POA: Diagnosis not present

## 2018-06-14 DIAGNOSIS — Z87891 Personal history of nicotine dependence: Secondary | ICD-10-CM

## 2018-06-14 DIAGNOSIS — R5383 Other fatigue: Secondary | ICD-10-CM

## 2018-06-14 DIAGNOSIS — I2510 Atherosclerotic heart disease of native coronary artery without angina pectoris: Secondary | ICD-10-CM

## 2018-06-14 DIAGNOSIS — R109 Unspecified abdominal pain: Secondary | ICD-10-CM | POA: Diagnosis not present

## 2018-06-14 DIAGNOSIS — Z7984 Long term (current) use of oral hypoglycemic drugs: Secondary | ICD-10-CM | POA: Insufficient documentation

## 2018-06-14 DIAGNOSIS — J849 Interstitial pulmonary disease, unspecified: Secondary | ICD-10-CM | POA: Diagnosis not present

## 2018-06-14 DIAGNOSIS — E1140 Type 2 diabetes mellitus with diabetic neuropathy, unspecified: Secondary | ICD-10-CM

## 2018-06-14 NOTE — Progress Notes (Signed)
Here for new pt / evaluation . Stated they requested second opinion pt stated.

## 2018-06-14 NOTE — Progress Notes (Signed)
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 [name] so far for his metastatic urothelial carcinoma.  This was originally diagnosed in May 2019.  He was noted to have a filling defect in the lower pole collecting system of the left kidney along with para-aortic and retroperitoneal adenopathy concerning for metastatic disease.  He underwent left ureteroscopy and renal pelvis biopsy which revealed small fragments of high-grade urothelial carcinoma with small focus of invasion.  He was started on carboplatin and gemcitabine chemotherapy in June 2019 and was continued on 05/27/2018 for 8 cycles.  He tolerated chemotherapy well except for chemo-induced anemia for which she has been getting Procrit every 2 weeks.  He did have response to his disease based on scans in August 2019.  However repeat scan on 05/31/2018 showed increase in the size of the primary tumor from 1.2 to 1.9 cm and increase in left para-aortic adenopathy from 0.9 to 1.3 cm.  Periportal adenopathy was stable at 1.4 cm.  Second line immunotherapy was recommended.  His initial biopsy specimen did not have enough sample to undergo FGFR mutation  testing.  He also has mild interstitial lung disease for which he sees pulmonary but he is not on home oxygen.  He has B12 deficiency for which he is on oral B12.  Also has diabetes and coronary artery disease.  Interval history-today patient is here with his wife and daughter.  He reports having some fatigue and mild intermittent abdominal pain.  Denies any nausea vomiting or problems with his bowel movements.  He has mild chronic cough.  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 Negative for chills, fever and weight loss.  HENT: Negative for congestion, ear discharge and nosebleeds.   Eyes: Negative for blurred vision.  Respiratory: Negative for cough, hemoptysis, sputum production, shortness of breath and wheezing.   Cardiovascular: Negative for chest pain, palpitations, orthopnea and claudication.  Gastrointestinal: Negative for abdominal pain, blood in stool, constipation, diarrhea, heartburn, melena, nausea and vomiting.  Genitourinary: Negative for dysuria, flank pain, frequency, hematuria and urgency.  Musculoskeletal: Negative for back pain, joint pain and myalgias.  Skin: Negative for rash.  Neurological: Negative for dizziness, tingling, focal weakness, seizures, weakness and headaches.  Endo/Heme/Allergies: Does not bruise/bleed easily.  Psychiatric/Behavioral: Negative for depression and suicidal ideas. The patient does not have insomnia.       Allergies  Allergen Reactions  . Sulfa Antibiotics Other (See Comments)    Joint pain  . Ace Inhibitors Cough  . Invokana [Canagliflozin] Other (See Comments)    Leg pain  . Penicillins Rash    Has patient had a PCN reaction causing immediate rash, facial/tongue/throat swelling, SOB or lightheadedness with  hypotension: Yes Has patient had a PCN reaction causing  severe rash involving mucus membranes or skin necrosis: No Has patient had a PCN reaction that required hospitalization: No Has patient had a PCN reaction occurring within the last 10 years: No If all of the above answers are "NO", then may proceed with Cephalosporin use.     Past Medical History:  Diagnosis Date  . Acid reflux   . Anxiety   . Depression   . Diabetes mellitus without complication ([place])   . Hyperlipidemia   . Hypertension   . Myocardial infarction ([place]) 1995  . Sleep apnea   . Urothelial cancer ([place]) 11/2017   Left Urothelial mass, chemo tx's     Past Surgical History:  Procedure Laterality Date  . CATARACT EXTRACTION Left   . COLONOSCOPY  2010   [facility]  . COLONOSCOPY WITH PROPOFOL N/A 10/04/2016   Procedure: COLONOSCOPY WITH PROPOFOL;  Surgeon: [name], MD;  Location: [facility] ENDOSCOPY;  Service: Endoscopy;  Laterality: N/A;  . [place], 2000, 2001, 2014  . CYSTOSCOPY W/ RETROGRADES Bilateral 12/10/2017   Procedure: CYSTOSCOPY WITH RETROGRADE PYELOGRAM;  Surgeon: [name], MD;  Location: [facility] ORS;  Service: Urology;  Laterality: Bilateral;  . CYSTOSCOPY WITH STENT PLACEMENT Left 12/10/2017   Procedure: CYSTOSCOPY WITH STENT PLACEMENT;  Surgeon: [name], MD;  Location: [facility] ORS;  Service: Urology;  Laterality: Left;  . INGUINAL HERNIA REPAIR Right 08/09/2015   Procedure: HERNIA REPAIR INGUINAL ADULT;  Surgeon: [name], MD;  Location: [facility] ORS;  Service: General;  Laterality: Right;  . NASAL SINUS SURGERY    . nuclear stress test    . PORTA CATH INSERTION N/A 12/19/2017   Procedure: PORTA CATH INSERTION;  Surgeon: [name], MD;  Location: [place] CV LAB;  Service: Cardiovascular;  Laterality: N/A;  . URETERAL BIOPSY Left 12/10/2017   Procedure: URETERAL & renal PELVIS BIOPSY;  Surgeon: [name], MD;  Location: [facility] ORS;  Service: Urology;  Laterality: Left;  . URETEROSCOPY Left  12/10/2017   Procedure: URETEROSCOPY;  Surgeon: [name], MD;  Location: [facility] ORS;  Service: Urology;  Laterality: Left;    Social History   Socioeconomic History  . Marital status: Married    Spouse name: Not on file  . Number of children: 1  . Years of education: Not on file  . Highest education level: Associate degree: occupational, Hotel manager, or vocational program  Occupational History  . Occupation: retired  Scientific laboratory technician  . Financial resource strain: Not hard at all  . Food insecurity:    Worry: Never true    Inability: Never true  . Transportation needs:    Medical: No    Non-medical: No  Tobacco Use  . Smoking status: Former Smoker    Types: Cigars  . Smokeless tobacco: Former Systems developer    Types: Chew  . Tobacco comment: on occasion  Substance and Sexual Activity  . Alcohol use: Not Currently    Comment: rarely  . Drug use: No  . Sexual activity: Not Currently  Lifestyle  . Physical activity:    Days per week: Not on file    Minutes per session: Not on file  . Stress: Only a little  Relationships  . Social connections:    Talks on phone: Not on file    Gets together: Not on file    Attends religious service: Not on file    Active member of club or organization:  Not on file    Attends meetings of clubs or organizations: Not on file    Relationship status: Not on file  . Intimate partner violence:    Fear of current or ex partner: Not on file    Emotionally abused: Not on file    Physically abused: Not on file    Forced sexual activity: Not on file  Other Topics Concern  . Not on file  Social History Narrative  . Not on file    Family History  Problem Relation Age of Onset  . Heart disease Mother   . Cancer Father        Lung and colon cancer  . Heart disease Father   . Emphysema Maternal Grandfather   . Tuberculosis Maternal Grandmother      Current Outpatient Medications:  .  atorvastatin (LIPITOR) 40 MG tablet, Take 1 tablet (40 mg total)  by mouth at bedtime., Disp: 90 tablet, Rfl: 3 .  carvedilol (COREG) 12.5 MG tablet, TAKE ONE TABLET BY MOUTH TWICE DAILY, Disp: 180 tablet, Rfl: 3 .  empagliflozin (JARDIANCE) 10 MG TABS tablet, Take 10 mg by mouth daily., Disp: 30 tablet, Rfl: 11 .  feeding supplement, ENSURE ENLIVE, (ENSURE ENLIVE) LIQD, Take 237 mLs by mouth 3 (three) times daily between meals., Disp: 90 Bottle, Rfl: 0 .  hydrOXYzine (ATARAX/VISTARIL) 25 MG tablet, Take 1 tablet (25 mg total) by mouth every 8 (eight) hours as needed for itching., Disp: 30 tablet, Rfl: 3 .  lidocaine-prilocaine (EMLA) cream, Apply 1 application topically as needed., Disp: 30 g, Rfl: 3 .  losartan (COZAAR) 100 MG tablet, TAKE 1 TABLET BY MOUTH ONCE DAILY, Disp: 90 tablet, Rfl: 3 .  metFORMIN (GLUCOPHAGE) 1000 MG tablet, TAKE 1 TABLET BY MOUTH TWICE DAILY WITH MEALS, Disp: 180 tablet, Rfl: 3 .  acetaminophen (TYLENOL) 500 MG tablet, Take 500 mg by mouth every 6 (six) hours as needed for moderate pain. When taking oxycodone 5 mg tablets, Disp: , Rfl:  .  ALPRAZolam (XANAX) 0.25 MG tablet, Take 1 tablet (0.25 mg total) by mouth 3 (three) times daily as needed for anxiety. (Patient not taking: Reported on 06/14/2018), Disp: 20 tablet, Rfl: 0 .  clobetasol cream (TEMOVATE) 8.58 %, Apply 1 application topically as needed (for skin rash). (Patient not taking: Reported on 06/14/2018), Disp: 30 g, Rfl: 1 .  fluticasone (FLONASE) 50 MCG/ACT nasal spray, Place 2 sprays into both nostrils daily. (Patient not taking: Reported on 06/14/2018), Disp: 16 g, Rfl: 12 .  glucose blood (CONTOUR NEXT TEST) test strip, Checks sugar 4 times daily. DX E11.9-strips for Contour next EZ meter (Patient not taking: Reported on 06/14/2018), Disp: 360 each, Rfl: 3 .  Ivermectin (SOOLANTRA) 1 % CREA, Apply topically at bedtime. To face, Disp: , Rfl:  .  nystatin cream (MYCOSTATIN), Apply topically 2 (two) times daily. (Patient not taking: Reported on 06/14/2018), Disp: 15 g, Rfl: 1 .   ondansetron (ZOFRAN) 8 MG tablet, Take 1 tablet (8 mg total) by mouth every 8 (eight) hours as needed for nausea or vomiting (if needed beginning 3 days after chemotherapy). (Patient not taking: Reported on 06/04/2018), Disp: 20 tablet, Rfl: 0 No current facility-administered medications for this visit.   Facility-Administered Medications Ordered in Other Visits:  .  Influenza vac split quadrivalent PF (FLUZONE HIGH-DOSE) injection 0.5 mL, 0.5 mL, Intramuscular, Once, Karen Kitchens, NP  Physical exam:  Vitals:   06/14/18 0901  BP: (!) 143/81  Pulse: 62  Resp: 18  Temp: (!) 94.2 F (34.6 C)  TempSrc: Tympanic  Weight: 175 lb 3.2 oz (79.5 kg)   Physical Exam  Constitutional: He is oriented to person, place, and time. He appears well-developed and well-nourished.  HENT:  Head: Normocephalic and atraumatic.  Eyes: Pupils are equal, round, and reactive to light. EOM are normal.  Neck: Normal range of motion.  Cardiovascular: Normal rate, regular rhythm and normal heart sounds.  Pulmonary/Chest: Effort normal and breath sounds normal.  Abdominal: Soft. Bowel sounds are normal. He exhibits no distension. There is no tenderness.  Musculoskeletal: He exhibits no edema.  Neurological: He is alert and oriented to person, place, and time.  Skin: Skin is warm and dry.     CMP Latest Ref Rng & Units 06/03/2018  Glucose 70 - 99 mg/dL 175(H)  BUN 8 - 23 mg/dL 17  Creatinine 0.61 - 1.24 mg/dL 0.77  Sodium 135 - 145 mmol/L 138  Potassium 3.5 - 5.1 mmol/L 4.3  Chloride 98 - 111 mmol/L 102  CO2 22 - 32 mmol/L 28  Calcium 8.9 - 10.3 mg/dL 9.7  Total Protein 6.5 - 8.1 g/dL 7.2  Total Bilirubin 0.3 - 1.2 mg/dL 0.8  Alkaline Phos 38 - 126 U/L 86  AST 15 - 41 U/L 31  ALT 0 - 44 U/L 26   CBC Latest Ref Rng & Units 06/03/2018  WBC 4.0 - 10.5 K/uL 3.4(L)  Hemoglobin 13.0 - 17.0 g/dL 10.2(L)  Hematocrit 39.0 - 52.0 % 30.8(L)  Platelets 150 - 400 K/uL 189      Dg Chest 2 View  Result Date:  05/20/2018 CLINICAL DATA:  Cough, fever. EXAM: CHEST - 2 VIEW COMPARISON:  Radiographs of September 06, 2011. FINDINGS: The heart size and mediastinal contours are within normal limits. Both lungs are clear. No pneumothorax or pleural effusion is noted. Right internal jugular Port-A-Cath is now noted with distal tip in expected position of cavoatrial junction. The visualized skeletal structures are unremarkable. IMPRESSION: No active cardiopulmonary disease. Interval placement of right internal jugular Port-A-Cath. Electronically Signed   By: Marijo Conception, M.D.   On: 05/20/2018 14:34   Ct Abdomen Pelvis W Contrast  Result Date: 05/31/2018 CLINICAL DATA:  Upper tract urothelial carcinoma of the left kidney, presenting for restaging with ongoing chemotherapy. EXAM: CT ABDOMEN AND PELVIS WITH CONTRAST TECHNIQUE: Multidetector CT imaging of the abdomen and pelvis was performed using the standard protocol following bolus administration of intravenous contrast. CONTRAST:  117mL ISOVUE-300 IOPAMIDOL (ISOVUE-300) INJECTION 61% COMPARISON:  02/22/2018 CT chest, abdomen and pelvis. FINDINGS: Lower chest: Stable patchy subpleural reticulation and ground-glass attenuation at both lung bases. Coronary atherosclerosis. Hepatobiliary: Normal liver size. No liver mass. Cholelithiasis. Contracted gallbladder. No biliary ductal dilatation. Pancreas: Diffuse fatty infiltration.  No mass.  No duct dilation. Spleen: Normal size. No mass. Adrenals/Urinary Tract: Normal adrenals. No hydronephrosis. Several simple renal cysts in both kidneys, largest 4.1 cm in upper right kidney. Poorly marginated urothelial mass in the lower left renal collecting system appears mildly increased in size, now 1.9 x 1.8 cm (series 9/image 25), increased from 1.2 x 1.0 cm using similar measurement technique on 02/22/2018 CT. Similar urothelial wall thickening and hyperenhancement in central lower left renal collecting system. Stable hypoperfusion of  the lower left kidney. Normal nondistended bladder. Stomach/Bowel: Normal non-distended stomach. Normal caliber small bowel with no small bowel wall thickening. Normal appendix. Mild sigmoid diverticulosis, with no large bowel wall thickening or acute pericolonic fat stranding. Oral contrast transits to the cecum. Vascular/Lymphatic:  Atherosclerotic nonaneurysmal abdominal aorta. Patent portal, splenic, hepatic and renal veins. Enlarged 1.3 cm left para-aortic node (series 2/image 39), increased from 0.9 cm. Periportal 1.4 cm node (series 2/image 22), stable. No additional pathologically enlarged nodes in the abdomen or pelvis. Reproductive: Mildly enlarged prostate. Other: No pneumoperitoneum, ascites or focal fluid collection. Slightly increased fat stranding in the central mesentery. Musculoskeletal: No aggressive appearing focal osseous lesions. Moderate lumbar spondylosis. IMPRESSION: 1. Primary urothelial tumor in the lower left renal collecting system is mildly increased. 2. Left para-aortic adenopathy is increased. Periportal adenopathy is stable. 3. No additional sites of metastatic disease in the abdomen or pelvis. 4. Chronic findings include: Aortic Atherosclerosis (ICD10-I70.0). Cholelithiasis. Coronary atherosclerosis. Mild sigmoid diverticulosis. Mild prostatomegaly. Electronically Signed   By: Ilona Sorrel M.D.   On: 05/31/2018 11:43   Ct Chest High Resolution  Result Date: 05/31/2018 CLINICAL DATA:  Follow-up interstitial lung disease. History of upper tract urothelial carcinoma of the left kidney. EXAM: CT CHEST WITHOUT CONTRAST TECHNIQUE: Multidetector CT imaging of the chest was performed following the standard protocol without intravenous contrast. High resolution imaging of the lungs, as well as inspiratory and expiratory imaging, was performed. COMPARISON:  02/22/2018 CT chest, abdomen and pelvis. FINDINGS: Cardiovascular: Top-normal heart size. No significant pericardial  effusion/thickening. Three-vessel coronary atherosclerosis. Right internal jugular Port-A-Cath terminates in the lower third of the SVC. Atherosclerotic nonaneurysmal abdominal aorta. Top-normal main pulmonary artery (3.4 cm diameter), stable. Mediastinum/Nodes: No discrete thyroid nodules. Unremarkable esophagus. No axillary adenopathy. Mildly enlarged 1.0 cm right lower paratracheal node (series 2/image 55), stable since 12/07/2017 CT. No new pathologically enlarged mediastinal nodes. No discrete hilar adenopathy on these noncontrast images. Lungs/Pleura: No pneumothorax. No pleural effusion. No acute consolidative airspace disease, lung masses or significant pulmonary nodules. Scattered punctate right lower lobe calcified granulomas are stable. No significant air trapping on the expiration sequence. There is patchy confluent subpleural reticulation and ground-glass attenuation throughout both lungs with associated mild traction bronchiolectasis and mild architectural distortion. There is a mild basilar gradient to these findings. No frank honeycombing. No appreciable progression of these findings since 12/07/2017 chest CT. Upper abdomen: Cholelithiasis. Partially visualized simple 3.3 cm upper left renal cyst. Musculoskeletal: No aggressive appearing focal osseous lesions. Mild thoracic spondylosis. IMPRESSION: 1. Spectrum of findings compatible with basilar predominant fibrotic interstitial lung disease without honeycombing and without appreciable progression since 12/07/2017 chest CT. Differential includes fibrotic phase nonspecific interstitial pneumonia (NSIP) or usual interstitial pneumonia (UIP). Findings are categorized as probable UIP per consensus guidelines: Diagnosis of Idiopathic Pulmonary Fibrosis: An Official ATS/ERS/JRS/ALAT Clinical Practice Guideline. [place], Iss 5, [phone], Mar 10 2017. 2. No findings suspicious for metastatic disease in the chest. Mild paratracheal  adenopathy is stable since 12/07/2017 chest CT, favor reactive. 3. Three-vessel coronary atherosclerosis. Aortic Atherosclerosis (ICD10-I70.0). Electronically Signed   By: Ilona Sorrel M.D.   On: 05/31/2018 11:24     Assessment and plan- Patient is a 72 y.o. male with stage IV metastatic left urothelial carcinoma [place] N2 M0 here to discuss second line treatment options  Patient has had clear progression on his carboplatin gemcitabine chemotherapy which he has now completed almost 8 cycles.  Discussed that second line immunotherapy with Tecentriq or Beryle Flock would be appropriate.  Patient is already been given information about Tecentriq and I would therefore like him to proceed with Tecentriq starting 06/17/2018.  Discussed risks and benefits of immunotherapy including all but not limited to autoimmune colitis, thyroiditis, pneumonitis and need to  monitor kidney and liver functions.  Patient understands and agrees to proceed.  Discussed that the likelihood of finding FGFR mutation is low and would require another biopsy.  Patient would like to hold off on the biopsy at this time but this can be considered down the line especially if he has progression on immunotherapy before exploring other chemotherapy options as third line  Patient's hemoglobin has been stable between 9-10 over the last 3 months.  He is not going to be receiving any further chemotherapy.  His most recent hemoglobin was 10.2.  I would therefore hold off on giving him any more Procrit at this time especially given the risks of strokes and heart attacks.  Patient is agreeable for the same.  Patient will proceed for his first cycle of Tecentriq on 06/17/2018 with CBC CMP TSH.  Return to clinic in 3 weeks on 07/08/2018 with CBC and CMP to see covering nurse practitioner.  I will see him back in 6 weeks time on 07/29/2017 with CBC and CMP for cycle 3 of Tecentriq.  Plan to get repeat scans after 4 cycles   Total face to face encounter time for  this patient visit was 40 min. >50% of the time was  spent in counseling and coordination of care.    Visit Diagnosis 1. Urothelial carcinoma of kidney, left ([place])   2. Encounter for antineoplastic immunotherapy      Dr. Randa Evens, MD, MPH [facility] [phone] 06/14/2018 10:47 AM

## 2018-06-14 NOTE — Progress Notes (Signed)
DISCONTINUE ON PATHWAY REGIMEN - Bladder     A cycle is every 21 days:     Carboplatin      Gemcitabine   **Always confirm dose/schedule in your pharmacy ordering system**  REASON: Disease Progression PRIOR TREATMENT: [id number]: Carboplatin AUC=5 D1 + Gemcitabine 1,000 mg/m2 D1, 8 q21 Days for a Maximum of 6 Cycles TREATMENT RESPONSE: Progressive Disease (PD)  START OFF PATHWAY REGIMEN - Bladder   [id number]:Atezolizumab 1,200 mg q21 Days:   A cycle is every 21 days:     Atezolizumab   **Always confirm dose/schedule in your pharmacy ordering system**  Patient Characteristics: Metastatic Disease, Second Line, FGFR2/FGFR3 Mutation Negative or Unknown AJCC M Category: M1a AJCC N Category: N2 AJCC T Category: T3 Current evidence of distant metastases<= Yes AJCC 8 Stage Grouping: IVA Line of Therapy: Second Line FGFR2/FGFR3 Mutation Status: Quantity Not Sufficient Intent of Therapy: Non-Curative / Palliative Intent, Discussed with Patient

## 2018-06-17 ENCOUNTER — Ambulatory Visit: Payer: Medicare Other

## 2018-06-17 ENCOUNTER — Other Ambulatory Visit: Payer: Medicare Other

## 2018-06-17 ENCOUNTER — Inpatient Hospital Stay: Payer: Medicare Other

## 2018-06-17 ENCOUNTER — Ambulatory Visit: Payer: Medicare Other | Admitting: Hematology and Oncology

## 2018-06-17 VITALS — BP 158/94 | HR 60 | Temp 98.1°F | Resp 18 | Wt 175.0 lb

## 2018-06-17 DIAGNOSIS — C642 Malignant neoplasm of left kidney, except renal pelvis: Secondary | ICD-10-CM | POA: Diagnosis not present

## 2018-06-17 DIAGNOSIS — Z5112 Encounter for antineoplastic immunotherapy: Secondary | ICD-10-CM | POA: Diagnosis not present

## 2018-06-17 DIAGNOSIS — R05 Cough: Secondary | ICD-10-CM | POA: Diagnosis not present

## 2018-06-17 DIAGNOSIS — R109 Unspecified abdominal pain: Secondary | ICD-10-CM | POA: Diagnosis not present

## 2018-06-17 DIAGNOSIS — E1140 Type 2 diabetes mellitus with diabetic neuropathy, unspecified: Secondary | ICD-10-CM | POA: Diagnosis not present

## 2018-06-17 DIAGNOSIS — I2510 Atherosclerotic heart disease of native coronary artery without angina pectoris: Secondary | ICD-10-CM | POA: Diagnosis not present

## 2018-06-17 DIAGNOSIS — C689 Malignant neoplasm of urinary organ, unspecified: Secondary | ICD-10-CM

## 2018-06-17 LAB — COMPREHENSIVE METABOLIC PANEL
ALT: 13 U/L (ref 0–44)
AST: 22 U/L (ref 15–41)
Albumin: 3.9 g/dL (ref 3.5–5.0)
Alkaline Phosphatase: 89 U/L (ref 38–126)
Anion gap: 9 (ref 5–15)
BUN: 14 mg/dL (ref 8–23)
CO2: 26 mmol/L (ref 22–32)
Calcium: 9.5 mg/dL (ref 8.9–10.3)
Chloride: 104 mmol/L (ref 98–111)
Creatinine, Ser: 0.79 mg/dL (ref 0.61–1.24)
GFR calc Af Amer: >60 mL/min (ref >60)
GFR calc non Af Amer: >60 mL/min (ref >60)
Glucose, Bld: 160 mg/dL — ABNORMAL HIGH (ref 70–99)
Potassium: 4.2 mmol/L (ref 3.5–5.1)
Sodium: 139 mmol/L (ref 135–145)
TOTAL PROTEIN: 7.1 g/dL (ref 6.5–8.1)
Total Bilirubin: 1 mg/dL (ref 0.3–1.2)

## 2018-06-17 LAB — TSH: TSH: 5.803 u[IU]/mL — ABNORMAL HIGH (ref 0.350–4.500)

## 2018-06-17 LAB — CBC WITH DIFFERENTIAL/PLATELET
ABS IMMATURE GRANULOCYTES: 0.01 10*3/uL (ref 0.00–0.07)
BASOS PCT: 2 %
Basophils Absolute: 0.1 10*3/uL (ref 0.0–0.1)
Eosinophils Absolute: 0.3 10*3/uL (ref 0.0–0.5)
Eosinophils Relative: 6 %
HCT: 31.9 % — ABNORMAL LOW (ref 39.0–52.0)
Hemoglobin: 10.3 g/dL — ABNORMAL LOW (ref 13.0–17.0)
Immature Granulocytes: 0 %
Lymphocytes Relative: 22 %
Lymphs Abs: 1 10*3/uL (ref 0.7–4.0)
MCH: 30.6 pg (ref 26.0–34.0)
MCHC: 32.3 g/dL (ref 30.0–36.0)
MCV: 94.7 fL (ref 80.0–100.0)
Monocytes Absolute: 0.8 10*3/uL (ref 0.1–1.0)
Monocytes Relative: 17 %
NEUTROS ABS: 2.3 10*3/uL (ref 1.7–7.7)
Neutrophils Relative %: 53 %
Platelets: 211 10*3/uL (ref 150–400)
RBC: 3.37 MIL/uL — ABNORMAL LOW (ref 4.22–5.81)
RDW: 16.7 % — ABNORMAL HIGH (ref 11.5–15.5)
WBC: 4.4 10*3/uL (ref 4.0–10.5)
nRBC: 0 % (ref 0.0–0.2)

## 2018-06-17 MED ORDER — SODIUM CHLORIDE 0.9% FLUSH
10.0000 mL | Freq: Once | INTRAVENOUS | Status: AC
  Administered 2018-06-17: 10 mL via INTRAVENOUS
  Filled 2018-06-17: qty 10

## 2018-06-17 MED ORDER — HEPARIN SOD (PORK) LOCK FLUSH 100 UNIT/ML IV SOLN
500.0000 [IU] | Freq: Once | INTRAVENOUS | Status: AC
  Administered 2018-06-17: 500 [IU] via INTRAVENOUS

## 2018-06-17 MED ORDER — SODIUM CHLORIDE 0.9 % IV SOLN
1200.0000 mg | Freq: Once | INTRAVENOUS | Status: AC
  Administered 2018-06-17: 1200 mg via INTRAVENOUS
  Filled 2018-06-17: qty 20

## 2018-06-17 MED ORDER — SODIUM CHLORIDE 0.9 % IV SOLN
Freq: Once | INTRAVENOUS | Status: AC
  Administered 2018-06-17: 14:00:00 via INTRAVENOUS
  Filled 2018-06-17: qty 250

## 2018-06-17 MED ORDER — HEPARIN SOD (PORK) LOCK FLUSH 100 UNIT/ML IV SOLN
500.0000 [IU] | Freq: Once | INTRAVENOUS | Status: DC | PRN
  Filled 2018-06-17: qty 5

## 2018-06-18 ENCOUNTER — Ambulatory Visit (INDEPENDENT_AMBULATORY_CARE_PROVIDER_SITE_OTHER): Payer: Medicare Other | Admitting: Family Medicine

## 2018-06-18 VITALS — BP 160/78 | HR 64 | Temp 98.5°F | Resp 16 | Wt 173.0 lb

## 2018-06-18 DIAGNOSIS — E7849 Other hyperlipidemia: Secondary | ICD-10-CM | POA: Diagnosis not present

## 2018-06-18 DIAGNOSIS — I10 Essential (primary) hypertension: Secondary | ICD-10-CM

## 2018-06-18 DIAGNOSIS — C689 Malignant neoplasm of urinary organ, unspecified: Secondary | ICD-10-CM

## 2018-06-18 DIAGNOSIS — E119 Type 2 diabetes mellitus without complications: Secondary | ICD-10-CM

## 2018-06-18 DIAGNOSIS — F329 Major depressive disorder, single episode, unspecified: Secondary | ICD-10-CM | POA: Diagnosis not present

## 2018-06-18 DIAGNOSIS — I2510 Atherosclerotic heart disease of native coronary artery without angina pectoris: Secondary | ICD-10-CM

## 2018-06-18 DIAGNOSIS — F32A Depression, unspecified: Secondary | ICD-10-CM

## 2018-06-18 MED ORDER — SERTRALINE HCL 50 MG PO TABS: 50.0000 mg | ORAL_TABLET | Freq: Every day | ORAL | 3 refills | Status: DC

## 2018-06-18 NOTE — Progress Notes (Signed)
Patrick Mcgee  MRN: [id number] DOB: 03/29/1946  Subjective:  HPI   The patient is a 72 year old male who presents for follow up of chronic health.  He was last seen for this on 02/27/18.  He has since that time been seen for an acute visit and for his wellness.  His A1C done on that day was 7.2 which was down from 7.9. Patient is undergoing a new immunotherapy for his cancer treatment.  He states that he had been on treatment that included steroids and therefore he thinks his A1C will higher than it was. Overall he is feeling fairly well despite his cancer.  The  last scan was essentially stable per the patient. We spent a long time discussing how he is doing emotionally and how he is coping with all of this.   Patient Active Problem List   Diagnosis Date Noted  . Anemia due to antineoplastic chemotherapy 03/03/2018  . Cough 03/03/2018  . Chemotherapy induced neutropenia ([place]) 01/22/2018  . B12 deficiency 01/01/2018  . Hypomagnesemia 12/21/2017  . Encounter for antineoplastic chemotherapy 12/21/2017  . Malignant neoplasm of kidney ([place])   . Urothelial cancer ([place]) 12/18/2017  . Malnutrition of moderate degree 12/18/2017  . Abdominal pain   . Therapeutic opioid induced constipation   . Palliative care encounter   . Cancer associated pain 12/17/2017  . Dehydration 12/17/2017  . Goals of care, counseling/discussion 12/13/2017  . 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 Right inguinal hernia 07/17/2015  . Family history of colon cancer 07/17/2015  . Allergic rhinitis 11/12/2014  . Airway hyperreactivity 11/12/2014  . Atherosclerosis of coronary artery 11/12/2014  . Cheilitis 11/12/2014  . Narrowing of intervertebral disc space 11/12/2014  . Deflected nasal septum 11/12/2014  . HLD (hyperlipidemia) 11/12/2014  . BP (high blood pressure) 11/12/2014  . Adult hypothyroidism 11/12/2014  . Adiposity 11/12/2014  . Sleep apnea 11/12/2014  . Diabetes mellitus, type 2  ([place]) 11/12/2014  . Degenerative disc disease, lumbar 09/24/2012  . Furunculosis 04/23/2012    Past Medical History:  Diagnosis Date  . Acid reflux   . Anxiety   . Depression   . Diabetes mellitus without complication ([place])   . Hyperlipidemia   . Hypertension   . Myocardial infarction ([place]) 1995  . Sleep apnea   . Urothelial cancer ([place]) 11/2017   Left Urothelial mass, chemo tx's    Social History   Socioeconomic History  . Marital status: Married    Spouse name: Not on file  . Number of children: 1  . Years of education: Not on file  . Highest education level: Associate degree: occupational, Hotel manager, or vocational program  Occupational History  . Occupation: retired  Scientific laboratory technician  . Financial resource strain: Not hard at all  . Food insecurity:    Worry: Never true    Inability: Never true  . Transportation needs:    Medical: No    Non-medical: No  Tobacco Use  . Smoking status: Former Smoker    Types: Cigars  . Smokeless tobacco: Former Systems developer    Types: Chew  . Tobacco comment: on occasion  Substance and Sexual Activity  . Alcohol use: Not Currently    Comment: rarely  . Drug use: No  . Sexual activity: Not Currently  Lifestyle  . Physical activity:    Days per week: Not on file    Minutes per session: Not on file  . Stress: Only a little  Relationships  .  Social connections:    Talks on phone: Not on file    Gets together: Not on file    Attends religious service: Not on file    Active member of club or organization: Not on file    Attends meetings of clubs or organizations: Not on file    Relationship status: Not on file  . Intimate partner violence:    Fear of current or ex partner: Not on file    Emotionally abused: Not on file    Physically abused: Not on file    Forced sexual activity: Not on file  Other Topics Concern  . Not on file  Social History Narrative  . Not on file    Outpatient Encounter Medications as of 06/18/2018    Medication Sig Note  . acetaminophen (TYLENOL) 500 MG tablet Take 500 mg by mouth every 6 (six) hours as needed for moderate pain. When taking oxycodone 5 mg tablets   . ALPRAZolam (XANAX) 0.25 MG tablet Take 1 tablet (0.25 mg total) by mouth 3 (three) times daily as needed for anxiety. (Patient not taking: Reported on 06/14/2018)   . atorvastatin (LIPITOR) 40 MG tablet Take 1 tablet (40 mg total) by mouth at bedtime.   . carvedilol (COREG) 12.5 MG tablet TAKE ONE TABLET BY MOUTH TWICE DAILY   . clobetasol cream (TEMOVATE) 0.98 % Apply 1 application topically as needed (for skin rash). (Patient not taking: Reported on 06/14/2018)   . empagliflozin (JARDIANCE) 10 MG TABS tablet Take 10 mg by mouth daily.   . feeding supplement, ENSURE ENLIVE, (ENSURE ENLIVE) LIQD Take 237 mLs by mouth 3 (three) times daily between meals.   . fluticasone (FLONASE) 50 MCG/ACT nasal spray Place 2 sprays into both nostrils daily. (Patient not taking: Reported on 06/14/2018)   . glucose blood (CONTOUR NEXT TEST) test strip Checks sugar 4 times daily. DX E11.9-strips for Contour next EZ meter (Patient not taking: Reported on 06/14/2018)   . hydrOXYzine (ATARAX/VISTARIL) 25 MG tablet Take 1 tablet (25 mg total) by mouth every 8 (eight) hours as needed for itching.   . Ivermectin (SOOLANTRA) 1 % CREA Apply topically at bedtime. To face 12/31/2017: As needed  . lidocaine-prilocaine (EMLA) cream Apply 1 application topically as needed.   Marland Kitchen losartan (COZAAR) 100 MG tablet TAKE 1 TABLET BY MOUTH ONCE DAILY   . metFORMIN (GLUCOPHAGE) 1000 MG tablet TAKE 1 TABLET BY MOUTH TWICE DAILY WITH MEALS   . nystatin cream (MYCOSTATIN) Apply topically 2 (two) times daily. (Patient not taking: Reported on 06/14/2018)   . ondansetron (ZOFRAN) 8 MG tablet Take 1 tablet (8 mg total) by mouth every 8 (eight) hours as needed for nausea or vomiting (if needed beginning 3 days after chemotherapy). (Patient not taking: Reported on 06/04/2018)     Facility-Administered Encounter Medications as of 06/18/2018  Medication  . Influenza vac split quadrivalent PF (FLUZONE HIGH-DOSE) injection 0.5 mL    Allergies  Allergen Reactions  . Sulfa Antibiotics Other (See Comments)    Joint pain  . Ace Inhibitors Cough  . Invokana [Canagliflozin] Other (See Comments)    Leg pain  . Penicillins Rash    Has patient had a PCN reaction causing immediate rash, facial/tongue/throat swelling, SOB or lightheadedness with hypotension: Yes Has patient had a PCN reaction causing severe rash involving mucus membranes or skin necrosis: No Has patient had a PCN reaction that required hospitalization: No Has patient had a PCN reaction occurring within the last 10 years: No If all  of the above answers are "NO", then may proceed with Cephalosporin use.    Review of Systems  Constitutional: Positive for malaise/fatigue. Negative for fever.  Respiratory: Negative for cough, shortness of breath and wheezing.   Cardiovascular: Negative for chest pain, palpitations, orthopnea, claudication and leg swelling.  Gastrointestinal: Negative.   Skin: Negative.   Neurological: Negative for dizziness and headaches.  Endo/Heme/Allergies: Negative.   Psychiatric/Behavioral:       Denies depression but he admits to being somewhat withdrawn from family and friends.    Objective:  BP (!) 160/78 (BP Location: Right Arm, Patient Position: Sitting, Cuff Size: Normal)   Pulse 64   Temp 98.5 F (36.9 C) (Oral)   Resp 16   Wt 173 lb (78.5 kg)   SpO2 98%   BMI 26.30 kg/m   Physical Exam  Constitutional: He is oriented to person, place, and time and well-developed, well-nourished, and in no distress.  HENT:  Head: Normocephalic and atraumatic.  Right Ear: External ear normal.  Left Ear: External ear normal.  Nose: Nose normal.  Eyes: Conjunctivae are normal. No scleral icterus.  Neck: No thyromegaly present.  Cardiovascular: Normal rate, regular rhythm and normal  heart sounds.  Pulmonary/Chest: Effort normal and breath sounds normal.  Abdominal: Soft.  Neurological: He is alert and oriented to person, place, and time. Gait normal. GCS score is 15.  Skin: Skin is warm and dry.  Psychiatric: Memory, affect and judgment normal.  His mood is a little flat today.    Assessment and Plan :  1. Essential hypertension We will follow-up on next visit.  2. Type 2 diabetes mellitus without complication, without long-term current use of insulin (HCC) Stable with his weight loss  3. Other hyperlipidemia   4. Depression, unspecified depression type This may be an adjustment reaction but I do think the patient is slightly depressed.  I recommended counseling and of started sertraline 50 mg daily [place] back in 1 to 2 months.  More than 50% of to 25-minute visit spent counseling and in coordination of care. - sertraline (ZOLOFT) 50 MG tablet; Take 1 tablet (50 mg total) by mouth daily.  Dispense: 30 tablet; Refill: 3  5. Arteriosclerosis of coronary artery Stable.  6. Urothelial cancer [facility]) Per oncology  I have done the exam and reviewed the chart and it is accurate to the best of my knowledge. Development worker, community has been used and  any errors in dictation or transcription are unintentional. [name] M.D. [facility]

## 2018-06-24 ENCOUNTER — Other Ambulatory Visit: Payer: Self-pay | Admitting: Family Medicine

## 2018-06-27 ENCOUNTER — Other Ambulatory Visit: Payer: Medicare Other

## 2018-06-27 DIAGNOSIS — E119 Type 2 diabetes mellitus without complications: Secondary | ICD-10-CM

## 2018-06-27 DIAGNOSIS — Z5111 Encounter for antineoplastic chemotherapy: Secondary | ICD-10-CM

## 2018-06-28 NOTE — Progress Notes (Signed)
PATIENT NAME: Patrick Mcgee DOB: Nov 24, 1945 MRN: [id number]  PRIMARY CARE PROVIDER: Jerrol Mcgee., MD  RESPONSIBLE PARTY:  Acct ID - Guarantor Home Phone Work Phone Relationship Acct Type  [id number] [name], [name] [phone]  Self P/F     [address], [place], [place] [address]    PLAN OF CARE and INTERVENTIONS:               1.  GOALS OF CARE/ ADVANCE CARE PLANNING: "To keep cancer from spreading/ growing"                  2.  PATIENT/CAREGIVER EDUCATION: Education on need to take meds as directed (patient recently started Zoloft), Education on symptom sof infection, fever, weakness, chills, cough                 4. PERSONAL EMERGENCY PLAN:  In Place               5.  DISEASE STATUS     HISTORY OF PRESENT ILLNESS:    CODE STATUS: Full Code ADVANCED DIRECTIVES: Unknown MOST FORM: No PPS: 60%   PHYSICAL EXAM:   VITALS:There were no vitals filed for this visit.  LUNGS: clear to auscultation  CARDIAC: Cor RRR and No murmurs EXTREMITIES: none edema SKIN:  Warm and dry without bruising or lesions NEURO: negative for dizziness and gait problems   Palliative Care visit with patient and wife Patrick Mcgee.  Patient alert and oriented.  Patient denies having pain at the present time.  Patient started Zoloft approximately 1 week ago.  Wife states patient was reluctant to take meds but states he has been taking to make wife happy.  Education to patient many patient's take anti-depressants. Wife states she feels like patient has been in a better mood.  Patient's reports his blood sugars have been better.  Patient states steriods he was taking ran his blood sugars up.  Patient is receiving immunotherapy and is hoping therapy will keep cancer from spreading. Patient has received 1 immunotherapy treatment.  Wife states patient will have another scan after receiving 5-6 treatments.  Support provided to patient and wife.  Patient and wife encouraged to contact Palliative Care with questions or  concerns.             Patrick Simmer, RN

## 2018-07-01 ENCOUNTER — Ambulatory Visit: Payer: Medicare Other

## 2018-07-01 ENCOUNTER — Other Ambulatory Visit: Payer: Medicare Other

## 2018-07-05 ENCOUNTER — Encounter: Payer: Self-pay | Admitting: Nurse Practitioner

## 2018-07-05 ENCOUNTER — Other Ambulatory Visit: Payer: Self-pay | Admitting: Oncology

## 2018-07-05 ENCOUNTER — Inpatient Hospital Stay: Payer: Medicare Other

## 2018-07-05 ENCOUNTER — Inpatient Hospital Stay (HOSPITAL_BASED_OUTPATIENT_CLINIC_OR_DEPARTMENT_OTHER): Payer: Medicare Other | Admitting: Nurse Practitioner

## 2018-07-05 VITALS — BP 113/71 | HR 61 | Temp 96.9°F | Wt 169.9 lb

## 2018-07-05 DIAGNOSIS — Z7984 Long term (current) use of oral hypoglycemic drugs: Secondary | ICD-10-CM | POA: Diagnosis not present

## 2018-07-05 DIAGNOSIS — E538 Deficiency of other specified B group vitamins: Secondary | ICD-10-CM | POA: Diagnosis not present

## 2018-07-05 DIAGNOSIS — Z5112 Encounter for antineoplastic immunotherapy: Secondary | ICD-10-CM | POA: Diagnosis not present

## 2018-07-05 DIAGNOSIS — R109 Unspecified abdominal pain: Secondary | ICD-10-CM | POA: Diagnosis not present

## 2018-07-05 DIAGNOSIS — G4700 Insomnia, unspecified: Secondary | ICD-10-CM | POA: Diagnosis not present

## 2018-07-05 DIAGNOSIS — E1140 Type 2 diabetes mellitus with diabetic neuropathy, unspecified: Secondary | ICD-10-CM

## 2018-07-05 DIAGNOSIS — C689 Malignant neoplasm of urinary organ, unspecified: Secondary | ICD-10-CM

## 2018-07-05 DIAGNOSIS — J849 Interstitial pulmonary disease, unspecified: Secondary | ICD-10-CM | POA: Diagnosis not present

## 2018-07-05 DIAGNOSIS — C642 Malignant neoplasm of left kidney, except renal pelvis: Secondary | ICD-10-CM | POA: Diagnosis not present

## 2018-07-05 DIAGNOSIS — R5383 Other fatigue: Secondary | ICD-10-CM

## 2018-07-05 DIAGNOSIS — I2510 Atherosclerotic heart disease of native coronary artery without angina pectoris: Secondary | ICD-10-CM

## 2018-07-05 DIAGNOSIS — R05 Cough: Secondary | ICD-10-CM | POA: Diagnosis not present

## 2018-07-05 LAB — COMPREHENSIVE METABOLIC PANEL
ALK PHOS: 92 U/L (ref 38–126)
ALT: 25 U/L (ref 0–44)
AST: 28 U/L (ref 15–41)
Albumin: 3.7 g/dL (ref 3.5–5.0)
Anion gap: 7 (ref 5–15)
BUN: 17 mg/dL (ref 8–23)
CALCIUM: 9.4 mg/dL (ref 8.9–10.3)
CO2: 27 mmol/L (ref 22–32)
Chloride: 101 mmol/L (ref 98–111)
Creatinine, Ser: 0.75 mg/dL (ref 0.61–1.24)
GFR calc Af Amer: >60 mL/min (ref >60)
Glucose, Bld: 213 mg/dL — ABNORMAL HIGH (ref 70–99)
Potassium: 4.4 mmol/L (ref 3.5–5.1)
Sodium: 135 mmol/L (ref 135–145)
Total Bilirubin: 1 mg/dL (ref 0.3–1.2)
Total Protein: 7.2 g/dL (ref 6.5–8.1)

## 2018-07-05 LAB — CBC WITH DIFFERENTIAL/PLATELET
Abs Immature Granulocytes: 0.01 10*3/uL (ref 0.00–0.07)
Basophils Absolute: 0 10*3/uL (ref 0.0–0.1)
Basophils Relative: 1 %
EOS ABS: 0.9 10*3/uL — AB (ref 0.0–0.5)
Eosinophils Relative: 16 %
HCT: 31.3 % — ABNORMAL LOW (ref 39.0–52.0)
Hemoglobin: 10.2 g/dL — ABNORMAL LOW (ref 13.0–17.0)
Immature Granulocytes: 0 %
Lymphocytes Relative: 24 %
Lymphs Abs: 1.4 10*3/uL (ref 0.7–4.0)
MCH: 30.4 pg (ref 26.0–34.0)
MCHC: 32.6 g/dL (ref 30.0–36.0)
MCV: 93.4 fL (ref 80.0–100.0)
Monocytes Absolute: 0.7 10*3/uL (ref 0.1–1.0)
Monocytes Relative: 13 %
Neutro Abs: 2.6 10*3/uL (ref 1.7–7.7)
Neutrophils Relative %: 46 %
Platelets: 135 10*3/uL — ABNORMAL LOW (ref 150–400)
RBC: 3.35 MIL/uL — ABNORMAL LOW (ref 4.22–5.81)
RDW: 15.8 % — AB (ref 11.5–15.5)
WBC: 5.6 10*3/uL (ref 4.0–10.5)
nRBC: 0 % (ref 0.0–0.2)

## 2018-07-05 MED ORDER — SODIUM CHLORIDE 0.9% FLUSH
10.0000 mL | INTRAVENOUS | Status: DC | PRN
  Administered 2018-07-05: 10 mL via INTRAVENOUS
  Filled 2018-07-05: qty 10

## 2018-07-05 MED ORDER — HEPARIN SOD (PORK) LOCK FLUSH 100 UNIT/ML IV SOLN
500.0000 [IU] | Freq: Once | INTRAVENOUS | Status: AC
  Administered 2018-07-05: 500 [IU] via INTRAVENOUS
  Filled 2018-07-05: qty 5

## 2018-07-05 MED ORDER — SODIUM CHLORIDE 0.9 % IV SOLN
1200.0000 mg | Freq: Once | INTRAVENOUS | Status: AC
  Administered 2018-07-05: 1200 mg via INTRAVENOUS
  Filled 2018-07-05: qty 20

## 2018-07-05 MED ORDER — SODIUM CHLORIDE 0.9 % IV SOLN
Freq: Once | INTRAVENOUS | Status: AC
  Administered 2018-07-05: 15:00:00 via INTRAVENOUS
  Filled 2018-07-05: qty 250

## 2018-07-05 MED ORDER — SODIUM CHLORIDE 0.9 % IV SOLN
Freq: Once | INTRAVENOUS | Status: AC
  Administered 2018-07-05: 500 mL via INTRAVENOUS
  Filled 2018-07-05: qty 250

## 2018-07-05 NOTE — Progress Notes (Signed)
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 [name] so far for his metastatic urothelial carcinoma.  This was originally diagnosed in May 2019.  He was noted to have a filling defect in the lower pole collecting system of the left kidney along with para-aortic and retroperitoneal adenopathy concerning for metastatic disease.  He underwent left ureteroscopy and renal pelvis biopsy which revealed small fragments of high-grade urothelial carcinoma with small focus of invasion.    He was started on carboplatin and gemcitabine chemotherapy in June 2019 and was continued on 05/27/2018 for 8 cycles.  He tolerated chemotherapy well except for chemo-induced anemia for which she has been getting Procrit every 2 weeks.    He did have response to his disease based on scans in August 2019.    However repeat scan on 05/31/2018 showed increase in the size of the primary tumor from 1.2 to 1.9 cm and increase in left para-aortic adenopathy from 0.9 to 1.3 cm.  Periportal adenopathy was stable at 1.4 cm.  Second line immunotherapy was recommended.    His initial biopsy specimen did not have enough sample to undergo FGFR  mutation testing.    He also has mild interstitial lung disease for which he sees pulmonary but he is not on home oxygen.  He has B12 deficiency for which he is on oral B12.  Also has diabetes and coronary artery disease.  Initiated Tecentriq on 06/17/2018  Interval history-patient here today with his wife. Reports feeling at baseline after first dose of Tecentriq.  Continues to report fatigue. Per wife, he stays awake until 2-3 am then sleeps off and on throughout day. He reports being interested in walking again and questions brochure from lobby about Care program. Otherwise he reports feeling well overall. Denies problems with bowel movements. Chronic neuropathy is stable and unchanged.  Blood sugars have been well controlled.  Denies fever or chills.  Denies itching or rash.  ECOG PS- 1 Pain scale- 0 Opioid associated constipation- no  Review of systems- Review of Systems  Constitutional: Positive for malaise/fatigue. Negative for chills, fever and weight loss.  HENT: Negative for congestion, ear discharge and nosebleeds.   Eyes: Negative for blurred vision.  Respiratory: Negative for cough, hemoptysis, sputum production, shortness of breath and wheezing.   Cardiovascular: Negative for chest pain, palpitations, orthopnea and claudication.  Gastrointestinal: Negative for abdominal pain, blood in stool, constipation, diarrhea, heartburn, melena, nausea and vomiting.  Genitourinary: Negative for dysuria, flank pain, frequency, hematuria and urgency.  Musculoskeletal: Negative for back pain, joint pain and myalgias.  Skin: Negative for rash.  Neurological: Negative for dizziness, tingling, focal weakness, seizures, weakness and headaches.  Endo/Heme/Allergies: Does not bruise/bleed easily.  Psychiatric/Behavioral: Negative for depression and suicidal ideas. The patient has insomnia.      Allergies  Allergen Reactions  . Sulfa Antibiotics Other (  See Comments)    Joint pain  . Ace  Inhibitors Cough  . Invokana [Canagliflozin] Other (See Comments)    Leg pain  . Penicillins Rash    Has patient had a PCN reaction causing immediate rash, facial/tongue/throat swelling, SOB or lightheadedness with hypotension: Yes Has patient had a PCN reaction causing severe rash involving mucus membranes or skin necrosis: No Has patient had a PCN reaction that required hospitalization: No Has patient had a PCN reaction occurring within the last 10 years: No If all of the above answers are "NO", then may proceed with Cephalosporin use.    Past Medical History:  Diagnosis Date  . Acid reflux   . Anxiety   . Depression   . Diabetes mellitus without complication ([place])   . Hyperlipidemia   . Hypertension   . Myocardial infarction ([place]) 1995  . Sleep apnea   . Urothelial cancer ([place]) 11/2017   Left Urothelial mass, chemo tx's    Past Surgical History:  Procedure Laterality Date  . CATARACT EXTRACTION Left   . COLONOSCOPY  2010   [facility]  . COLONOSCOPY WITH PROPOFOL N/A 10/04/2016   Procedure: COLONOSCOPY WITH PROPOFOL;  Surgeon: [name], MD;  Location: [facility] ENDOSCOPY;  Service: Endoscopy;  Laterality: N/A;  . [place], 2000, 2001, 2014  . CYSTOSCOPY W/ RETROGRADES Bilateral 12/10/2017   Procedure: CYSTOSCOPY WITH RETROGRADE PYELOGRAM;  Surgeon: [name], MD;  Location: [facility] ORS;  Service: Urology;  Laterality: Bilateral;  . CYSTOSCOPY WITH STENT PLACEMENT Left 12/10/2017   Procedure: CYSTOSCOPY WITH STENT PLACEMENT;  Surgeon: [name], MD;  Location: [facility] ORS;  Service: Urology;  Laterality: Left;  . INGUINAL HERNIA REPAIR Right 08/09/2015   Procedure: HERNIA REPAIR INGUINAL ADULT;  Surgeon: [name], MD;  Location: [facility] ORS;  Service: General;  Laterality: Right;  . NASAL SINUS SURGERY    . nuclear stress test    . PORTA CATH INSERTION N/A 12/19/2017   Procedure: PORTA CATH INSERTION;  Surgeon: [name],  MD;  Location: [place] [facility];  Service: Cardiovascular;  Laterality: N/A;  . URETERAL BIOPSY Left 12/10/2017   Procedure: URETERAL & renal PELVIS BIOPSY;  Surgeon: [name], MD;  Location: [facility] ORS;  Service: Urology;  Laterality: Left;  . URETEROSCOPY Left 12/10/2017   Procedure: URETEROSCOPY;  Surgeon: [name], MD;  Location: [facility] ORS;  Service: Urology;  Laterality: Left;    Social History   Socioeconomic History  . Marital status: Married    Spouse name: Not on file  . Number of children: 1  . Years of education: Not on file  . Highest education level: Associate degree: occupational, Hotel manager, or vocational program  Occupational History  . Occupation: retired  Scientific laboratory technician  . Financial resource strain: Not hard at all  . Food insecurity:    Worry: Never true    Inability: Never true  . Transportation needs:    Medical: No    Non-medical: No  Tobacco Use  . Smoking status: Former Smoker    Types: Cigars  . Smokeless tobacco: Former Systems developer    Types: Chew  . Tobacco comment: on occasion  Substance and Sexual Activity  . Alcohol use: Not Currently    Comment: rarely  . Drug use: No  . Sexual activity: Not Currently  Lifestyle  . Physical activity:    Days per week: Not on file    Minutes per session: Not on file  . Stress:  Only a little  Relationships  . Social connections:    Talks on phone: Not on file    Gets together: Not on file    Attends religious service: Not on file    Active member of club or organization: Not on file    Attends meetings of clubs or organizations: Not on file    Relationship status: Not on file  . Intimate partner violence:    Fear of current or ex partner: Not on file    Emotionally abused: Not on file    Physically abused: Not on file    Forced sexual activity: Not on file  Other Topics Concern  . Not on file  Social History Narrative  . Not on file    Family History  Problem Relation Age of Onset  . Heart  disease Mother   . Cancer Father        Lung and colon cancer  . Heart disease Father   . Emphysema Maternal Grandfather   . Tuberculosis Maternal Grandmother     Current Outpatient Medications:  .  acetaminophen (TYLENOL) 500 MG tablet, Take 500 mg by mouth every 6 (six) hours as needed for moderate pain. When taking oxycodone 5 mg tablets, Disp: , Rfl:  .  atorvastatin (LIPITOR) 40 MG tablet, Take 1 tablet (40 mg total) by mouth at bedtime., Disp: 90 tablet, Rfl: 3 .  carvedilol (COREG) 12.5 MG tablet, TAKE 1 TABLET BY MOUTH TWICE DAILY, Disp: 180 tablet, Rfl: 3 .  clobetasol cream (TEMOVATE) 4.69 %, Apply 1 application topically as needed (for skin rash)., Disp: 30 g, Rfl: 1 .  empagliflozin (JARDIANCE) 10 MG TABS tablet, Take 10 mg by mouth daily., Disp: 30 tablet, Rfl: 11 .  feeding supplement, ENSURE ENLIVE, (ENSURE ENLIVE) LIQD, Take 237 mLs by mouth 3 (three) times daily between meals., Disp: 90 Bottle, Rfl: 0 .  fluticasone (FLONASE) 50 MCG/ACT nasal spray, Place 2 sprays into both nostrils daily., Disp: 16 g, Rfl: 12 .  glucose blood (CONTOUR NEXT TEST) test strip, Checks sugar 4 times daily. DX E11.9-strips for Contour next EZ meter, Disp: 360 each, Rfl: 3 .  hydrOXYzine (ATARAX/VISTARIL) 25 MG tablet, Take 1 tablet (25 mg total) by mouth every 8 (eight) hours as needed for itching., Disp: 30 tablet, Rfl: 3 .  Ivermectin (SOOLANTRA) 1 % CREA, Apply topically at bedtime. To face, Disp: , Rfl:  .  lidocaine-prilocaine (EMLA) cream, Apply 1 application topically as needed., Disp: 30 g, Rfl: 3 .  losartan (COZAAR) 100 MG tablet, TAKE 1 TABLET BY MOUTH ONCE DAILY, Disp: 90 tablet, Rfl: 3 .  metFORMIN (GLUCOPHAGE) 1000 MG tablet, TAKE 1 TABLET BY MOUTH TWICE DAILY WITH MEALS, Disp: 180 tablet, Rfl: 3 .  nystatin cream (MYCOSTATIN), Apply topically 2 (two) times daily., Disp: 15 g, Rfl: 1 .  ondansetron (ZOFRAN) 8 MG tablet, Take 1 tablet (8 mg total) by mouth every 8 (eight) hours as  needed for nausea or vomiting (if needed beginning 3 days after chemotherapy)., Disp: 20 tablet, Rfl: 0 .  sertraline (ZOLOFT) 50 MG tablet, Take 1 tablet (50 mg total) by mouth daily., Disp: 30 tablet, Rfl: 3 .  ALPRAZolam (XANAX) 0.25 MG tablet, Take 1 tablet (0.25 mg total) by mouth 3 (three) times daily as needed for anxiety. (Patient not taking: Reported on 07/05/2018), Disp: 20 tablet, Rfl: 0 No current facility-administered medications for this visit.   Facility-Administered Medications Ordered in Other Visits:  .  heparin lock flush 100  unit/mL, 500 Units, Intravenous, Once, Sindy Guadeloupe, MD .  Influenza vac split quadrivalent PF (FLUZONE HIGH-DOSE) injection 0.5 mL, 0.5 mL, Intramuscular, Once, Honor Loh E, NP .  sodium chloride flush (NS) 0.9 % injection 10 mL, 10 mL, Intravenous, PRN, Sindy Guadeloupe, MD, 10 mL at 07/05/18 1332  Physical exam:  Vitals:   07/05/18 1358  BP: 113/71  Pulse: 61  Temp: (!) 96.9 F (36.1 C)  TempSrc: Tympanic  Weight: 169 lb 14.4 oz (77.1 kg)   Physical Exam Constitutional:      Appearance: He is well-developed.  HENT:     Head: Normocephalic and atraumatic.  Eyes:     Pupils: Pupils are equal, round, and reactive to light.  Neck:     Musculoskeletal: Normal range of motion.  Cardiovascular:     Rate and Rhythm: Normal rate and regular rhythm.     Heart sounds: Normal heart sounds.  Pulmonary:     Effort: Pulmonary effort is normal.     Breath sounds: Normal breath sounds.  Abdominal:     General: Bowel sounds are normal. There is no distension.     Palpations: Abdomen is soft.     Tenderness: There is no abdominal tenderness.  Skin:    General: Skin is warm and dry.  Neurological:     Mental Status: He is alert and oriented to person, place, and time.      CMP Latest Ref Rng & Units 07/05/2018  Glucose 70 - 99 mg/dL 213(H)  BUN 8 - 23 mg/dL 17  Creatinine 0.61 - 1.24 mg/dL 0.75  Sodium 135 - 145 mmol/L 135  Potassium 3.5 -  5.1 mmol/L 4.4  Chloride 98 - 111 mmol/L 101  CO2 22 - 32 mmol/L 27  Calcium 8.9 - 10.3 mg/dL 9.4  Total Protein 6.5 - 8.1 g/dL 7.2  Total Bilirubin 0.3 - 1.2 mg/dL 1.0  Alkaline Phos 38 - 126 U/L 92  AST 15 - 41 U/L 28  ALT 0 - 44 U/L 25   CBC Latest Ref Rng & Units 07/05/2018  WBC 4.0 - 10.5 K/uL 5.6  Hemoglobin 13.0 - 17.0 g/dL 10.2(L)  Hematocrit 39.0 - 52.0 % 31.3(L)  Platelets 150 - 400 K/uL 135(L)      No results found.   Assessment and Plan- Patient is a 72 y.o. male with stage IV metastatic left urothelial carcinoma TX N2 M0 who returns to clinic today for consideration of cycle 2 of second line Tecentriq.  Patient had clear progression on carboplatin gemcitabine chemotherapy and initiated second line immunotherapy with Tecentriq on 06/14/2018.  Tolerated his first cycle well.  Platelets 135-continue to monitor. TSH on 06/17/18 was 5.803 which is slightly elevated. Given fatigue (see below), could consider medication but I will defer to Dr. Janese Banks.  Continue to hold Procrit as hemoglobin is at baseline.  Labs today reviewed and acceptable to proceed with cycle 2 of Tecentriq. Will give extra liter of fluids today.   Continues to have daytime fatigue and nighttime insomnia.  Discussed possible medications to help which patient is reluctant to consider at this time.  Also discussed lifestyle modifications such as activity during the day and sleep hygiene measures.  Discussed previously prescribed Xanax at bedtime.  Will refer to care program as well.   He requests to move his infusions to Mondays now that Dr. Janese Banks will be in [place] on Mondays.  Will discuss with scheduling.  Return to clinic as scheduled on 07/29/2017 with CBC  and CMP for consideration of cycle 3 of Tecentriq.  Per Dr. Janese Banks, plan is to repeat scans after 4 cycles.   Visit Diagnosis 1. Urothelial cancer ([place])     Beckey Rutter, DNP, AGNP-C [place] [facility] [phone] (work  cell) [phone] (office)   CC: Dr. Janese Banks

## 2018-07-26 ENCOUNTER — Ambulatory Visit: Payer: Medicare Other

## 2018-07-26 ENCOUNTER — Ambulatory Visit: Payer: Medicare Other | Admitting: Oncology

## 2018-07-26 ENCOUNTER — Other Ambulatory Visit: Payer: Medicare Other

## 2018-07-26 ENCOUNTER — Other Ambulatory Visit: Payer: Self-pay | Admitting: *Deleted

## 2018-07-26 DIAGNOSIS — Z5112 Encounter for antineoplastic immunotherapy: Secondary | ICD-10-CM

## 2018-07-26 DIAGNOSIS — C642 Malignant neoplasm of left kidney, except renal pelvis: Secondary | ICD-10-CM

## 2018-07-29 ENCOUNTER — Encounter: Payer: Self-pay | Admitting: Oncology

## 2018-07-29 ENCOUNTER — Inpatient Hospital Stay: Payer: Medicare Other | Attending: Oncology

## 2018-07-29 ENCOUNTER — Inpatient Hospital Stay (HOSPITAL_BASED_OUTPATIENT_CLINIC_OR_DEPARTMENT_OTHER): Payer: Medicare Other | Admitting: Oncology

## 2018-07-29 ENCOUNTER — Inpatient Hospital Stay: Payer: Medicare Other

## 2018-07-29 VITALS — BP 108/72 | HR 66 | Temp 97.3°F | Resp 18 | Ht 68.0 in | Wt 168.6 lb

## 2018-07-29 DIAGNOSIS — Z5112 Encounter for antineoplastic immunotherapy: Secondary | ICD-10-CM | POA: Insufficient documentation

## 2018-07-29 DIAGNOSIS — E538 Deficiency of other specified B group vitamins: Secondary | ICD-10-CM | POA: Insufficient documentation

## 2018-07-29 DIAGNOSIS — C642 Malignant neoplasm of left kidney, except renal pelvis: Secondary | ICD-10-CM

## 2018-07-29 DIAGNOSIS — Z87891 Personal history of nicotine dependence: Secondary | ICD-10-CM | POA: Insufficient documentation

## 2018-07-29 DIAGNOSIS — E119 Type 2 diabetes mellitus without complications: Secondary | ICD-10-CM

## 2018-07-29 DIAGNOSIS — C689 Malignant neoplasm of urinary organ, unspecified: Secondary | ICD-10-CM

## 2018-07-29 DIAGNOSIS — I2510 Atherosclerotic heart disease of native coronary artery without angina pectoris: Secondary | ICD-10-CM | POA: Diagnosis not present

## 2018-07-29 LAB — COMPREHENSIVE METABOLIC PANEL
ALT: 13 U/L (ref 0–44)
ANION GAP: 9 (ref 5–15)
AST: 20 U/L (ref 15–41)
Albumin: 4 g/dL (ref 3.5–5.0)
Alkaline Phosphatase: 90 U/L (ref 38–126)
BUN: 11 mg/dL (ref 8–23)
CO2: 27 mmol/L (ref 22–32)
Calcium: 9.5 mg/dL (ref 8.9–10.3)
Chloride: 102 mmol/L (ref 98–111)
Creatinine, Ser: 0.88 mg/dL (ref 0.61–1.24)
GFR calc Af Amer: >60 mL/min (ref >60)
GFR calc non Af Amer: >60 mL/min (ref >60)
Glucose, Bld: 202 mg/dL — ABNORMAL HIGH (ref 70–99)
Potassium: 4.2 mmol/L (ref 3.5–5.1)
Sodium: 138 mmol/L (ref 135–145)
Total Bilirubin: 1 mg/dL (ref 0.3–1.2)
Total Protein: 7.4 g/dL (ref 6.5–8.1)

## 2018-07-29 LAB — CBC WITH DIFFERENTIAL/PLATELET
Abs Immature Granulocytes: 0.01 10*3/uL (ref 0.00–0.07)
Basophils Absolute: 0 10*3/uL (ref 0.0–0.1)
Basophils Relative: 1 %
Eosinophils Absolute: 0.6 10*3/uL — ABNORMAL HIGH (ref 0.0–0.5)
Eosinophils Relative: 10 %
HCT: 34.7 % — ABNORMAL LOW (ref 39.0–52.0)
Hemoglobin: 11.2 g/dL — ABNORMAL LOW (ref 13.0–17.0)
Immature Granulocytes: 0 %
Lymphocytes Relative: 20 %
Lymphs Abs: 1.3 10*3/uL (ref 0.7–4.0)
MCH: 30.3 pg (ref 26.0–34.0)
MCHC: 32.3 g/dL (ref 30.0–36.0)
MCV: 93.8 fL (ref 80.0–100.0)
Monocytes Absolute: 0.6 10*3/uL (ref 0.1–1.0)
Monocytes Relative: 9 %
NEUTROS PCT: 60 %
NRBC: 0 % (ref 0.0–0.2)
Neutro Abs: 4 10*3/uL (ref 1.7–7.7)
Platelets: 171 10*3/uL (ref 150–400)
RBC: 3.7 MIL/uL — AB (ref 4.22–5.81)
RDW: 14.9 % (ref 11.5–15.5)
WBC: 6.5 10*3/uL (ref 4.0–10.5)

## 2018-07-29 LAB — TSH: TSH: 3.213 u[IU]/mL (ref 0.350–4.500)

## 2018-07-29 MED ORDER — SODIUM CHLORIDE 0.9 % IV SOLN
1200.0000 mg | Freq: Once | INTRAVENOUS | Status: AC
  Administered 2018-07-29: 1200 mg via INTRAVENOUS
  Filled 2018-07-29: qty 20

## 2018-07-29 MED ORDER — HEPARIN SOD (PORK) LOCK FLUSH 100 UNIT/ML IV SOLN
500.0000 [IU] | Freq: Once | INTRAVENOUS | Status: AC
  Administered 2018-07-29: 500 [IU] via INTRAVENOUS
  Filled 2018-07-29: qty 5

## 2018-07-29 MED ORDER — SODIUM CHLORIDE 0.9 % IV SOLN
Freq: Once | INTRAVENOUS | Status: AC
  Administered 2018-07-29: 14:00:00 via INTRAVENOUS
  Filled 2018-07-29: qty 250

## 2018-07-29 MED ORDER — SODIUM CHLORIDE 0.9% FLUSH
10.0000 mL | INTRAVENOUS | Status: DC | PRN
  Administered 2018-07-29: 10 mL via INTRAVENOUS
  Filled 2018-07-29: qty 10

## 2018-07-29 NOTE — Progress Notes (Signed)
Does not have same appetite that he use to have . His b/p lower today and he says when he is in rehab they told him to drink more water

## 2018-07-30 NOTE — Progress Notes (Signed)
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 [name] so far for his metastatic urothelial carcinoma.  This was originally diagnosed in May 2019.  He was noted to have a filling defect in the lower pole collecting system of the left kidney along with para-aortic and retroperitoneal adenopathy concerning for metastatic disease.  He underwent left ureteroscopy and renal pelvis biopsy which revealed small fragments of high-grade urothelial carcinoma with small focus of invasion.  He was started on carboplatin and gemcitabine chemotherapy in June 2019 and was continued on 05/27/2018 for 8 cycles.  He tolerated chemotherapy well except for chemo-induced anemia for which she has been getting Procrit every 2 weeks.  He did have response to his disease based on scans in August 2019.  However repeat scan on 05/31/2018 showed increase in the size of the primary tumor from 1.2 to 1.9 cm and increase in left para-aortic adenopathy from 0.9 to 1.3 cm.  Periportal adenopathy was stable at 1.4 cm.  Second line immunotherapy was recommended.  His initial biopsy specimen did not have enough sample to undergo FGFR mutation  testing.  He also has mild interstitial lung disease for which he sees pulmonary but he is not on home oxygen.  He has B12 deficiency for which he is on oral B12.  Also has diabetes and coronary artery disease.   Interval history-he has joined the care program and exercises regularly.  He reports readings of low blood pressure in the 100s before and after the exercise.  Appetite is fair and weight has been stable.  Denies any pain at this time  ECOG PS- 1 Pain scale- 0 Opioid associated constipation- no  Review of systems- Review of Systems  Constitutional: Positive for malaise/fatigue. Negative for chills, fever and weight loss.  HENT: Negative for congestion, ear discharge and nosebleeds.   Eyes: Negative for blurred vision.  Respiratory: Negative for cough, hemoptysis, sputum production, shortness of breath and wheezing.   Cardiovascular: Negative for chest pain, palpitations, orthopnea and claudication.  Gastrointestinal: Negative for abdominal pain, blood in stool, constipation, diarrhea, heartburn, melena, nausea and vomiting.  Genitourinary: Negative for dysuria, flank pain, frequency, hematuria and urgency.  Musculoskeletal: Negative for back pain, joint pain and myalgias.  Skin: Negative for rash.  Neurological: Negative for dizziness, tingling, focal weakness, seizures, weakness and headaches.  Endo/Heme/Allergies: Does not bruise/bleed easily.  Psychiatric/Behavioral: Negative for depression and suicidal ideas. The patient does not have insomnia.       Allergies  Allergen Reactions  . Sulfa Antibiotics Other (See Comments)    Joint pain  . Ace Inhibitors Cough  . Invokana [Canagliflozin] Other (See Comments)    Leg pain  . Penicillins Rash    Has patient had a PCN reaction causing immediate rash, facial/tongue/throat swelling, SOB or lightheadedness with hypotension: Yes Has patient  had a PCN reaction causing severe rash involving mucus membranes or skin necrosis: No Has  patient had a PCN reaction that required hospitalization: No Has patient had a PCN reaction occurring within the last 10 years: No If all of the above answers are "NO", then may proceed with Cephalosporin use.     Past Medical History:  Diagnosis Date  . Acid reflux   . Anxiety   . Depression   . Diabetes mellitus without complication ([place])   . Hyperlipidemia   . Hypertension   . Myocardial infarction ([place]) 1995  . Sleep apnea   . Urothelial cancer ([place]) 11/2017   Left Urothelial mass, chemo tx's     Past Surgical History:  Procedure Laterality Date  . CATARACT EXTRACTION Left   . COLONOSCOPY  2010   [facility]  . COLONOSCOPY WITH PROPOFOL N/A 10/04/2016   Procedure: COLONOSCOPY WITH PROPOFOL;  Surgeon: [name], MD;  Location: [facility] ENDOSCOPY;  Service: Endoscopy;  Laterality: N/A;  . [place], 2000, 2001, 2014  . CYSTOSCOPY W/ RETROGRADES Bilateral 12/10/2017   Procedure: CYSTOSCOPY WITH RETROGRADE PYELOGRAM;  Surgeon: [name], MD;  Location: [facility] ORS;  Service: Urology;  Laterality: Bilateral;  . CYSTOSCOPY WITH STENT PLACEMENT Left 12/10/2017   Procedure: CYSTOSCOPY WITH STENT PLACEMENT;  Surgeon: [name], MD;  Location: [facility] ORS;  Service: Urology;  Laterality: Left;  . INGUINAL HERNIA REPAIR Right 08/09/2015   Procedure: HERNIA REPAIR INGUINAL ADULT;  Surgeon: [name], MD;  Location: [facility] ORS;  Service: General;  Laterality: Right;  . NASAL SINUS SURGERY    . nuclear stress test    . PORTA CATH INSERTION N/A 12/19/2017   Procedure: PORTA CATH INSERTION;  Surgeon: [name], MD;  Location: [facility];  Service: Cardiovascular;  Laterality: N/A;  . URETERAL BIOPSY Left 12/10/2017   Procedure: URETERAL & renal PELVIS BIOPSY;  Surgeon: [name], MD;  Location: [facility] ORS;  Service: Urology;  Laterality: Left;  . URETEROSCOPY Left 12/10/2017   Procedure: URETEROSCOPY;  Surgeon: [name],  MD;  Location: [facility] ORS;  Service: Urology;  Laterality: Left;    Social History   Socioeconomic History  . Marital status: Married    Spouse name: Not on file  . Number of children: 1  . Years of education: Not on file  . Highest education level: Associate degree: occupational, Hotel manager, or vocational program  Occupational History  . Occupation: retired  Scientific laboratory technician  . Financial resource strain: Not hard at all  . Food insecurity:    Worry: Never true    Inability: Never true  . Transportation needs:    Medical: No    Non-medical: No  Tobacco Use  . Smoking status: Former Smoker    Types: Cigars  . Smokeless tobacco: Former Systems developer    Types: Chew  . Tobacco comment: on occasion  Substance and Sexual Activity  . Alcohol use: Not Currently    Comment: rarely  . Drug use: No  . Sexual activity: Not Currently  Lifestyle  . Physical activity:    Days per week: Not on file    Minutes per session: Not on file  . Stress: Only a little  Relationships  . Social connections:    Talks on phone: Not on file    Gets together: Not on file    Attends religious service: Not on file    Active member of club or organization: Not on file  Attends meetings of clubs or organizations: Not on file    Relationship status: Not on file  . Intimate partner violence:    Fear of current or ex partner: Not on file    Emotionally abused: Not on file    Physically abused: Not on file    Forced sexual activity: Not on file  Other Topics Concern  . Not on file  Social History Narrative  . Not on file    Family History  Problem Relation Age of Onset  . Heart disease Mother   . Cancer Father        Lung and colon cancer  . Heart disease Father   . Emphysema Maternal Grandfather   . Tuberculosis Maternal Grandmother      Current Outpatient Medications:  .  acetaminophen (TYLENOL) 500 MG tablet, Take 500 mg by mouth every 6 (six) hours as needed for moderate pain. When taking oxycodone  5 mg tablets, Disp: , Rfl:  .  atorvastatin (LIPITOR) 40 MG tablet, Take 1 tablet (40 mg total) by mouth at bedtime., Disp: 90 tablet, Rfl: 3 .  carvedilol (COREG) 12.5 MG tablet, TAKE 1 TABLET BY MOUTH TWICE DAILY, Disp: 180 tablet, Rfl: 3 .  clobetasol cream (TEMOVATE) 0.62 %, Apply 1 application topically as needed (for skin rash)., Disp: 30 g, Rfl: 1 .  empagliflozin (JARDIANCE) 10 MG TABS tablet, Take 10 mg by mouth daily., Disp: 30 tablet, Rfl: 11 .  feeding supplement, ENSURE ENLIVE, (ENSURE ENLIVE) LIQD, Take 237 mLs by mouth 3 (three) times daily between meals. (Patient taking differently: Take 237 mLs by mouth daily as needed. ), Disp: 90 Bottle, Rfl: 0 .  glucose blood (CONTOUR NEXT TEST) test strip, Checks sugar 4 times daily. DX E11.9-strips for Contour next EZ meter, Disp: 360 each, Rfl: 3 .  hydrOXYzine (ATARAX/VISTARIL) 25 MG tablet, Take 1 tablet (25 mg total) by mouth every 8 (eight) hours as needed for itching., Disp: 30 tablet, Rfl: 3 .  Ivermectin (SOOLANTRA) 1 % CREA, Apply topically at bedtime. To face, Disp: , Rfl:  .  lidocaine-prilocaine (EMLA) cream, Apply 1 application topically as needed., Disp: 30 g, Rfl: 3 .  losartan (COZAAR) 100 MG tablet, TAKE 1 TABLET BY MOUTH ONCE DAILY, Disp: 90 tablet, Rfl: 3 .  metFORMIN (GLUCOPHAGE) 1000 MG tablet, TAKE 1 TABLET BY MOUTH TWICE DAILY WITH MEALS, Disp: 180 tablet, Rfl: 3 .  nystatin cream (MYCOSTATIN), Apply topically 2 (two) times daily. (Patient taking differently: Apply topically 2 (two) times daily as needed. ), Disp: 15 g, Rfl: 1 .  sertraline (ZOLOFT) 50 MG tablet, Take 1 tablet (50 mg total) by mouth daily., Disp: 30 tablet, Rfl: 3 .  fluticasone (FLONASE) 50 MCG/ACT nasal spray, Place 2 sprays into both nostrils daily. (Patient not taking: Reported on 07/29/2018), Disp: 16 g, Rfl: 12 .  ondansetron (ZOFRAN) 8 MG tablet, Take 1 tablet (8 mg total) by mouth every 8 (eight) hours as needed for nausea or vomiting (if needed  beginning 3 days after chemotherapy). (Patient not taking: Reported on 07/29/2018), Disp: 20 tablet, Rfl: 0 No current facility-administered medications for this visit.   Facility-Administered Medications Ordered in Other Visits:  .  Influenza vac split quadrivalent PF (FLUZONE HIGH-DOSE) injection 0.5 mL, 0.5 mL, Intramuscular, Once, Karen Kitchens, NP  Physical exam:  Vitals:   07/29/18 1332  BP: 108/72  Pulse: 66  Resp: 18  Temp: (!) 97.3 F (36.3 C)  TempSrc: Tympanic  Weight: 168 lb 9.6  oz (76.5 kg)  Height: 5\' 8"  (1.727 m)   Physical Exam Constitutional:      General: He is not in acute distress. HENT:     Head: Normocephalic and atraumatic.  Eyes:     Pupils: Pupils are equal, round, and reactive to light.  Neck:     Musculoskeletal: Normal range of motion.  Cardiovascular:     Rate and Rhythm: Normal rate and regular rhythm.     Heart sounds: Normal heart sounds.  Pulmonary:     Effort: Pulmonary effort is normal.     Breath sounds: Normal breath sounds.  Abdominal:     General: Bowel sounds are normal.     Palpations: Abdomen is soft.  Skin:    General: Skin is warm and dry.  Neurological:     Mental Status: He is alert and oriented to person, place, and time.      CMP Latest Ref Rng & Units 07/29/2018  Glucose 70 - 99 mg/dL 202(H)  BUN 8 - 23 mg/dL 11  Creatinine 0.61 - 1.24 mg/dL 0.88  Sodium 135 - 145 mmol/L 138  Potassium 3.5 - 5.1 mmol/L 4.2  Chloride 98 - 111 mmol/L 102  CO2 22 - 32 mmol/L 27  Calcium 8.9 - 10.3 mg/dL 9.5  Total Protein 6.5 - 8.1 g/dL 7.4  Total Bilirubin 0.3 - 1.2 mg/dL 1.0  Alkaline Phos 38 - 126 U/L 90  AST 15 - 41 U/L 20  ALT 0 - 44 U/L 13   CBC Latest Ref Rng & Units 07/29/2018  WBC 4.0 - 10.5 K/uL 6.5  Hemoglobin 13.0 - 17.0 g/dL 11.2(L)  Hematocrit 39.0 - 52.0 % 34.7(L)  Platelets 150 - 400 K/uL 171     Assessment and plan- Patient is a 73 y.o. male with stage IV metastatic left urothelial carcinoma TX N2 M0.  He is  here for on treatment assessment prior to cycle 3 of Tecentriq  Counts are okay to proceed with cycle 3 of Tecentriq today.  I will see him back in 3 weeks with CBC CMP and TSH for cycle 4.  Plan to get repeat scans after cycle 4  Patient did not have enough tissue sample to detect an FGFR mutation on his initial pathology specimen.  FGFR mutations are seen in up to 20% of patients with metastatic urothelial cancer and can be as high as 37% in patients with a poor urothelial tract cancer such as his.  I therefore feel that it would be worth attempting a second biopsy at progression   Visit Diagnosis 1. Encounter for antineoplastic immunotherapy   2. Urothelial carcinoma of kidney, left (HCC)      Dr. Randa Evens, MD, MPH [facility] [phone] 07/30/2018 9:00 AM

## 2018-07-31 ENCOUNTER — Other Ambulatory Visit: Payer: Medicare Other

## 2018-08-01 ENCOUNTER — Telehealth: Payer: Self-pay | Admitting: *Deleted

## 2018-08-01 ENCOUNTER — Other Ambulatory Visit: Payer: Self-pay | Admitting: *Deleted

## 2018-08-01 NOTE — Telephone Encounter (Signed)
Ok thanks 

## 2018-08-01 NOTE — Progress Notes (Signed)
COMMUNITY PALLIATIVE CARE SW NOTE  PATIENT NAME: Patrick Mcgee DOB: 03-04-1946 MRN: [id number]  PRIMARY CARE PROVIDER: Jerrol Banana., MD  RESPONSIBLE PARTY:  Acct ID - Guarantor Home Phone Work Phone Relationship Acct Type  [id number] Patrick Mcgee [phone]  Self P/F     [address] [place], [place], [place] [address]     PLAN OF CARE and INTERVENTIONS:             1. GOALS OF CARE/ ADVANCE CARE PLANNING:  Patient remains a full code at this time.  2. SOCIAL/EMOTIONAL/SPIRITUAL ASSESSMENT/ INTERVENTIONS:  Patient is alert and oriented. Present with wife for this visit. He seems to be in better spirits according to his wife, as they attribute this change to patient being started on Zoloft. Both are pleased that he is "less negative about things". Patient is able to get out and go more and is babysitting his 75 year old grandaughter once a week for a couple hours alone. He states he is going to exercise twice a week and can tell a big difference in how he feels physically, which helps with his mental and emotional well-being. Continues to have good support from wife, family, and friends. Maintains strong faith and reports this continues to be a huge source of support for him as he continues his cancer journey. 3. PATIENT/CAREGIVER EDUCATION/ COPING:  Wife is coping with patient's ongoing medical needs. She is pleased that he seems to be getting out of the house more, but verbalizes feeling some frustration over the amount of time he spends sleeping. Unknown whether increased sleeping is due to current immunotherapy or due to patient's "depression" according to wife. SW provided education on physical and emotional toll the cancer journey can have on one's body and mind. Encouraged patient to continue with activities he enjoys as he reports that the more he sleeps, the better he feels.  4. PERSONAL EMERGENCY PLAN:  Patient and wife plan to call 911 with emergencies. 5. COMMUNITY RESOURCES  COORDINATION/ HEALTH CARE NAVIGATION:  Patient is due to have next set of scans on 08/29/2018. Will plan to follow-up after patient and wife meet with oncologist to determine treatment plan. Should patient need another biopsy. Wife plans to ask cancer center to obtain prior approval as she voiced concerns over not wanting to be "stuck with a large bill".  6. FINANCIAL/LEGAL CONCERNS/INTERVENTIONS:  None at this time.  I spent 90 minutes providing consultation and support to patient and family from 12:00pm-1:30pm.      SOCIAL HX:  Social History   Tobacco Use  . Smoking status: Former Smoker    Types: Cigars  . Smokeless tobacco: Former Systems developer    Types: Chew  . Tobacco comment: on occasion  Substance Use Topics  . Alcohol use: Not Currently    Comment: rarely    CODE STATUS:   Code Status: Prior  ADVANCED DIRECTIVES: N MOST FORM COMPLETE:  no HOSPICE EDUCATION PROVIDED: Not at this time, patient still desires aggressive treatment.   TMA:[id number] independent for all ADLs and IADLs.       Patrick Mcgee

## 2018-08-01 NOTE — Telephone Encounter (Signed)
Patient was brought over to the [place] from [place], where he had been exercising today. Patient's blood pressure was noted to be low (80/60) according to the RN from [facility], prior to and after his exercise. Patient was given two bottles of water to drink and BP went up to around (98/60). RN was concerned because patient's BP was low and he is taking losartan and carvedilol.  Patient was alert and oriented and in no acute distress. When asked how he was feeling, he replied "great"! Patient's blood pressure was checked in the [place] and it was 154/88. Patient stated that he felt fine, even when his pressures were lower earlier today at [facility]. Per Dr. Elroy Channel suggestion, instructed patient to only take half of his losartan tonight, but continue his carvedilol as prescribed. Also informed patient that we will notify Dr. Rosanna Randy to see if he needs to change anything. Patient verbalized understanding.

## 2018-08-05 ENCOUNTER — Telehealth: Payer: Self-pay | Admitting: *Deleted

## 2018-08-05 NOTE — Telephone Encounter (Signed)
Called to check back up on patient and see how he is doing with his blood pressure or if he is having any problems from his blood pressure being low.  Patient states he feels like he is fine he has been checking his blood pressure with a cuff there at the home.  The readings are usually in the 130s on the top and the lower number is in the 70s to the low 80s.  He did decrease his blood pressure pill to a half a pill just like Dr. Janese Banks had told him to do.  He plans on taking his blood pressure cuff tomorrow to his fitness class so that when they take his blood pressure then he can use his machine and see if they are accurate with each other.  He did feel like after the lady brought him will be here on Friday that he might be a little dehydrated and he has been sure to drink plenty of water but he has to put flavor packets in order to drink it

## 2018-08-06 ENCOUNTER — Ambulatory Visit (INDEPENDENT_AMBULATORY_CARE_PROVIDER_SITE_OTHER): Payer: Medicare Other | Admitting: Family Medicine

## 2018-08-06 VITALS — BP 108/64 | HR 60 | Temp 97.8°F | Resp 16 | Wt 169.0 lb

## 2018-08-06 DIAGNOSIS — E440 Moderate protein-calorie malnutrition: Secondary | ICD-10-CM | POA: Diagnosis not present

## 2018-08-06 DIAGNOSIS — G893 Neoplasm related pain (acute) (chronic): Secondary | ICD-10-CM

## 2018-08-06 DIAGNOSIS — G4733 Obstructive sleep apnea (adult) (pediatric): Secondary | ICD-10-CM | POA: Diagnosis not present

## 2018-08-06 DIAGNOSIS — E119 Type 2 diabetes mellitus without complications: Secondary | ICD-10-CM | POA: Diagnosis not present

## 2018-08-06 DIAGNOSIS — F329 Major depressive disorder, single episode, unspecified: Secondary | ICD-10-CM

## 2018-08-06 DIAGNOSIS — F32A Depression, unspecified: Secondary | ICD-10-CM

## 2018-08-06 DIAGNOSIS — I25119 Atherosclerotic heart disease of native coronary artery with unspecified angina pectoris: Secondary | ICD-10-CM

## 2018-08-06 DIAGNOSIS — C642 Malignant neoplasm of left kidney, except renal pelvis: Secondary | ICD-10-CM

## 2018-08-06 MED ORDER — SERTRALINE HCL 100 MG PO TABS: 100.0000 mg | ORAL_TABLET | Freq: Every day | ORAL | 5 refills | Status: DC

## 2018-08-06 NOTE — Progress Notes (Signed)
Patrick Mcgee  MRN: [id number] DOB: [birth year]  Subjective:  HPI   The patient is a 73 year old male who presents for follow up after starting treatment for depression.  The patient was last seen on 06/09/18.  At that time he was started on Sertraline 50 mg daily. It was also recommended that the patient seek counseling as well.  The patient states he does not see any difference in himself.  However, his wife and daughter can tell a difference.  He then stated that he is a little less agitated.  He does not react to things as quickly as he had been. The patient states that when he was at the hospital in the cardiac rehab exercise class.  He states that his pressure was low and they would not let him participate until he was checked by the cancer center.  He was seen by them and also his cardiologist.  His cardiologist recommended that he decrease his Losartan to 50 mg.  He is doing this however, he states it is not exact because his pill is in the shape of a heart making it difficult to cut.  Patient Active Problem List   Diagnosis Date Noted  . Anemia due to antineoplastic chemotherapy 03/03/2018  . Cough 03/03/2018  . Chemotherapy induced neutropenia ([place]) 01/22/2018  . B12 deficiency 01/01/2018  . Hypomagnesemia 12/21/2017  . Encounter for antineoplastic chemotherapy 12/21/2017  . Malignant neoplasm of kidney ([place])   . Urothelial cancer ([place]) 12/18/2017  . Malnutrition of moderate degree 12/18/2017  . Therapeutic opioid induced constipation   . Palliative care encounter   . Dehydration 12/17/2017  . Goals of care, counseling/discussion 12/13/2017  . Urothelial carcinoma of kidney, left ([place]) 12/07/2017  . Right inguinal hernia 07/17/2015  . Family history of colon cancer 07/17/2015  . Allergic rhinitis 11/12/2014  . Airway hyperreactivity 11/12/2014  . Atherosclerosis of coronary artery 11/12/2014  . Cheilitis 11/12/2014  . Narrowing of intervertebral disc space 11/12/2014  .  Deflected nasal septum 11/12/2014  . HLD (hyperlipidemia) 11/12/2014  . BP (high blood pressure) 11/12/2014  . Adult hypothyroidism 11/12/2014  . Sleep apnea 11/12/2014  . Diabetes mellitus, type 2 ([place]) 11/12/2014  . Degenerative disc disease, lumbar 09/24/2012  . Furunculosis 04/23/2012    Past Medical History:  Diagnosis Date  . Acid reflux   . Anxiety   . Depression   . Diabetes mellitus without complication ([place])   . Hyperlipidemia   . Hypertension   . Myocardial infarction ([place]) 1995  . Sleep apnea   . Urothelial cancer ([place]) 11/2017   Left Urothelial mass, chemo tx's    Social History   Socioeconomic History  . Marital status: Married    Spouse name: Not on file  . Number of children: 1  . Years of education: Not on file  . Highest education level: Associate degree: occupational, Hotel manager, or vocational program  Occupational History  . Occupation: retired  Scientific laboratory technician  . Financial resource strain: Not hard at all  . Food insecurity:    Worry: Never true    Inability: Never true  . Transportation needs:    Medical: No    Non-medical: No  Tobacco Use  . Smoking status: Former Smoker    Types: Cigars  . Smokeless tobacco: Former Systems developer    Types: Chew  . Tobacco comment: on occasion  Substance and Sexual Activity  . Alcohol use: Not Currently    Comment: rarely  . Drug use: No  .  Sexual activity: Not Currently  Lifestyle  . Physical activity:    Days per week: Not on file    Minutes per session: Not on file  . Stress: Only a little  Relationships  . Social connections:    Talks on phone: Not on file    Gets together: Not on file    Attends religious service: Not on file    Active member of club or organization: Not on file    Attends meetings of clubs or organizations: Not on file    Relationship status: Not on file  . Intimate partner violence:    Fear of current or ex partner: Not on file    Emotionally abused: Not on file    Physically  abused: Not on file    Forced sexual activity: Not on file  Other Topics Concern  . Not on file  Social History Narrative  . Not on file    Outpatient Encounter Medications as of 08/06/2018  Medication Sig Note  . acetaminophen (TYLENOL) 500 MG tablet Take 500 mg by mouth every 6 (six) hours as needed for moderate pain. When taking oxycodone 5 mg tablets   . atorvastatin (LIPITOR) 40 MG tablet Take 1 tablet (40 mg total) by mouth at bedtime.   . carvedilol (COREG) 12.5 MG tablet TAKE 1 TABLET BY MOUTH TWICE DAILY   . clobetasol cream (TEMOVATE) 5.02 % Apply 1 application topically as needed (for skin rash).   . empagliflozin (JARDIANCE) 10 MG TABS tablet Take 10 mg by mouth daily. 07/29/2018: Pt states that he just takes it if sugar over 200 on daily basis  . feeding supplement, ENSURE ENLIVE, (ENSURE ENLIVE) LIQD Take 237 mLs by mouth 3 (three) times daily between meals. (Patient taking differently: Take 237 mLs by mouth daily as needed. )   . fluticasone (FLONASE) 50 MCG/ACT nasal spray Place 2 sprays into both nostrils daily. 07/29/2018: Rx is messed up and he will work with pharmacy  . glucose blood (CONTOUR NEXT TEST) test strip Checks sugar 4 times daily. DX E11.9-strips for Contour next EZ meter   . hydrOXYzine (ATARAX/VISTARIL) 25 MG tablet Take 1 tablet (25 mg total) by mouth every 8 (eight) hours as needed for itching.   . Ivermectin (SOOLANTRA) 1 % CREA Apply topically at bedtime. To face 12/31/2017: As needed  . lidocaine-prilocaine (EMLA) cream Apply 1 application topically as needed.   Marland Kitchen losartan (COZAAR) 100 MG tablet TAKE 1 TABLET BY MOUTH ONCE DAILY (Patient taking differently: 50 mg. )   . metFORMIN (GLUCOPHAGE) 1000 MG tablet TAKE 1 TABLET BY MOUTH TWICE DAILY WITH MEALS   . nystatin cream (MYCOSTATIN) Apply topically 2 (two) times daily. (Patient taking differently: Apply topically 2 (two) times daily as needed. )   . ondansetron (ZOFRAN) 8 MG tablet Take 1 tablet (8 mg total)  by mouth every 8 (eight) hours as needed for nausea or vomiting (if needed beginning 3 days after chemotherapy).   . sertraline (ZOLOFT) 50 MG tablet Take 1 tablet (50 mg total) by mouth daily.    Facility-Administered Encounter Medications as of 08/06/2018  Medication  . Influenza vac split quadrivalent PF (FLUZONE HIGH-DOSE) injection 0.5 mL    Allergies  Allergen Reactions  . Sulfa Antibiotics Other (See Comments)    Joint pain  . Ace Inhibitors Cough  . Invokana [Canagliflozin] Other (See Comments)    Leg pain  . Penicillins Rash    Has patient had a PCN reaction causing immediate rash, facial/tongue/throat  swelling, SOB or lightheadedness with hypotension: Yes Has patient had a PCN reaction causing severe rash involving mucus membranes or skin necrosis: No Has patient had a PCN reaction that required hospitalization: No Has patient had a PCN reaction occurring within the last 10 years: No If all of the above answers are "NO", then may proceed with Cephalosporin use.    Review of Systems  Constitutional: Positive for malaise/fatigue. Negative for fever.  Eyes: Negative.   Respiratory: Negative for cough, shortness of breath and wheezing.   Cardiovascular: Negative for chest pain, palpitations and leg swelling.  Gastrointestinal: Negative.   Genitourinary: Negative.   Musculoskeletal: Negative.   Skin: Negative.   Endo/Heme/Allergies: Negative.   Psychiatric/Behavioral: Negative.     Objective:  BP 108/64 (BP Location: Right Arm, Patient Position: Sitting, Cuff Size: Normal)   Pulse 60   Temp 97.8 F (36.6 C) (Oral)   Resp 16   Wt 169 lb (76.7 kg)   SpO2 98%   BMI 25.70 kg/m   Physical Exam  Constitutional: He is oriented to person, place, and time and well-developed, well-nourished, and in no distress.  HENT:  Head: Normocephalic and atraumatic.  Right Ear: External ear normal.  Left Ear: External ear normal.  Nose: Nose normal.  Eyes: Conjunctivae are normal.  No scleral icterus.  Neck: No thyromegaly present.  Cardiovascular: Normal rate, regular rhythm and normal heart sounds.  Pulmonary/Chest: Effort normal and breath sounds normal.  Abdominal: Soft.  Neurological: He is alert and oriented to person, place, and time. Gait normal. GCS score is 15.  Skin: Skin is warm and dry.  Psychiatric: Memory, affect and judgment normal.  His mood is a little flat today.    Assessment and Plan :  1. Malnutrition of moderate degree Encourage patient to try to keep his weight up.  2. Depression, unspecified depression type Sertraline from 50 to 100 mg daily to hopefully help with his anhedonia. - sertraline (ZOLOFT) 100 MG tablet; Take 1 tablet (100 mg total) by mouth daily.  Dispense: 30 tablet; Refill: 5 More than 50% of 30-minute visit is spent in counseling and coordination of care. 3. Cancer associated pain Pain is basically resolved.  4. Urothelial carcinoma of kidney, left ([place]) Followed by oncology.  5. Atherosclerosis of native coronary artery of native heart with angina pectoris ([place])   6. Obstructive sleep apnea syndrome   7. Type 2 diabetes mellitus without complication, without long-term current use of insulin (HCC) Control with weight loss.  I have done the exam and reviewed the chart and it is accurate to the best of my knowledge. Development worker, community has been used and  any errors in dictation or transcription are unintentional. [name] M.D. [facility]

## 2018-08-19 ENCOUNTER — Inpatient Hospital Stay: Payer: Medicare Other

## 2018-08-19 ENCOUNTER — Inpatient Hospital Stay: Payer: Medicare Other | Attending: Oncology | Admitting: Oncology

## 2018-08-19 ENCOUNTER — Encounter: Payer: Self-pay | Admitting: Oncology

## 2018-08-19 ENCOUNTER — Other Ambulatory Visit: Payer: Self-pay

## 2018-08-19 VITALS — BP 133/82 | HR 54 | Temp 97.8°F | Resp 18 | Wt 169.8 lb

## 2018-08-19 DIAGNOSIS — Z5112 Encounter for antineoplastic immunotherapy: Secondary | ICD-10-CM | POA: Diagnosis not present

## 2018-08-19 DIAGNOSIS — Z87891 Personal history of nicotine dependence: Secondary | ICD-10-CM | POA: Diagnosis not present

## 2018-08-19 DIAGNOSIS — C642 Malignant neoplasm of left kidney, except renal pelvis: Secondary | ICD-10-CM

## 2018-08-19 DIAGNOSIS — C689 Malignant neoplasm of urinary organ, unspecified: Secondary | ICD-10-CM

## 2018-08-19 DIAGNOSIS — R5382 Chronic fatigue, unspecified: Secondary | ICD-10-CM

## 2018-08-19 DIAGNOSIS — R109 Unspecified abdominal pain: Secondary | ICD-10-CM | POA: Diagnosis not present

## 2018-08-19 LAB — COMPREHENSIVE METABOLIC PANEL
ALT: 12 U/L (ref 0–44)
AST: 15 U/L (ref 15–41)
Albumin: 3.8 g/dL (ref 3.5–5.0)
Alkaline Phosphatase: 96 U/L (ref 38–126)
Anion gap: 6 (ref 5–15)
BUN: 19 mg/dL (ref 8–23)
CO2: 29 mmol/L (ref 22–32)
Calcium: 9.4 mg/dL (ref 8.9–10.3)
Chloride: 100 mmol/L (ref 98–111)
Creatinine, Ser: 0.73 mg/dL (ref 0.61–1.24)
GFR calc non Af Amer: >60 mL/min (ref >60)
Glucose, Bld: 220 mg/dL — ABNORMAL HIGH (ref 70–99)
Potassium: 4.2 mmol/L (ref 3.5–5.1)
Sodium: 135 mmol/L (ref 135–145)
Total Bilirubin: 0.9 mg/dL (ref 0.3–1.2)
Total Protein: 7.3 g/dL (ref 6.5–8.1)

## 2018-08-19 LAB — CBC WITH DIFFERENTIAL/PLATELET
Abs Immature Granulocytes: 0.03 10*3/uL (ref 0.00–0.07)
BASOS ABS: 0.1 10*3/uL (ref 0.0–0.1)
Basophils Relative: 1 %
Eosinophils Absolute: 0.7 10*3/uL — ABNORMAL HIGH (ref 0.0–0.5)
Eosinophils Relative: 11 %
HEMATOCRIT: 34.7 % — AB (ref 39.0–52.0)
Hemoglobin: 11.3 g/dL — ABNORMAL LOW (ref 13.0–17.0)
Immature Granulocytes: 1 %
Lymphocytes Relative: 23 %
Lymphs Abs: 1.5 10*3/uL (ref 0.7–4.0)
MCH: 29.8 pg (ref 26.0–34.0)
MCHC: 32.6 g/dL (ref 30.0–36.0)
MCV: 91.6 fL (ref 80.0–100.0)
Monocytes Absolute: 0.7 10*3/uL (ref 0.1–1.0)
Monocytes Relative: 10 %
Neutro Abs: 3.5 10*3/uL (ref 1.7–7.7)
Neutrophils Relative %: 54 %
Platelets: 204 10*3/uL (ref 150–400)
RBC: 3.79 MIL/uL — ABNORMAL LOW (ref 4.22–5.81)
RDW: 14.3 % (ref 11.5–15.5)
WBC: 6.4 10*3/uL (ref 4.0–10.5)
nRBC: 0 % (ref 0.0–0.2)

## 2018-08-19 MED ORDER — SODIUM CHLORIDE 0.9% FLUSH
10.0000 mL | Freq: Once | INTRAVENOUS | Status: AC
  Administered 2018-08-19: 10 mL via INTRAVENOUS
  Filled 2018-08-19: qty 10

## 2018-08-19 MED ORDER — SODIUM CHLORIDE 0.9 % IV SOLN
Freq: Once | INTRAVENOUS | Status: AC
  Administered 2018-08-19: 14:00:00 via INTRAVENOUS
  Filled 2018-08-19: qty 250

## 2018-08-19 MED ORDER — SODIUM CHLORIDE 0.9 % IV SOLN
1200.0000 mg | Freq: Once | INTRAVENOUS | Status: AC
  Administered 2018-08-19: 1200 mg via INTRAVENOUS
  Filled 2018-08-19: qty 20

## 2018-08-19 MED ORDER — HEPARIN SOD (PORK) LOCK FLUSH 100 UNIT/ML IV SOLN
500.0000 [IU] | Freq: Once | INTRAVENOUS | Status: AC
  Administered 2018-08-19: 500 [IU] via INTRAVENOUS
  Filled 2018-08-19: qty 5

## 2018-08-19 NOTE — Progress Notes (Signed)
Here for follow up. In general " feeling good" per pt and wife sleeping "too much ..I  like to sleep " per pt.

## 2018-08-19 NOTE — Progress Notes (Signed)
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 [name] so far for his metastatic urothelial carcinoma. This was originally diagnosed in May 2019. He was noted to have a filling defect in the lower pole collecting system of the left kidney along with para-aortic and retroperitoneal adenopathy concerning for metastatic disease. He underwent left ureteroscopy and renal pelvis biopsy which revealed small fragments of high-grade urothelial carcinoma with small focus of invasion. He was started on carboplatin and gemcitabine chemotherapy in June 2019 and was continued on 05/27/2018 for 8 cycles. He tolerated chemotherapy well except for chemo-induced anemia for which she has been getting Procrit every 2 weeks. He did have response to his disease based on scans in August 2019. However repeat scan on 05/31/2018 showed increase in the size of the primary tumor from 1.2 to 1.9 cm and increase in left para-aortic adenopathy from 0.9 to 1.3 cm. Periportal adenopathy was stable at 1.4 cm. Second line immunotherapy was recommended. His initial biopsy specimen did not have enough sample to undergo FGFR mutation  testing. He also has mild interstitial lung disease for which he sees pulmonary but he is not on home oxygen. He has B12 deficiency for which he is on oral B12. Also has diabetes and coronary artery disease.  Tecentriq started on 06/17/2018   Interval history-he is tolerating Tecentriq well without any significant side effects.  He does have mild chronic fatigue which is essentially stable.  He has been having some symptoms of agitation and mood disorder for which she was started on Zoloft by Dr. Rosanna Randy and the dose was recently increased to 100 mg daily.  Patient reports occasional sharp pain in his right abdomen which comes and goes and is infrequent  ECOG PS- 1 Pain scale- 0 Opioid associated constipation- no  Review of systems- Review of Systems  Constitutional: Positive for malaise/fatigue. Negative for chills, fever and weight loss.  HENT: Negative for congestion, ear discharge and nosebleeds.   Eyes: Negative for blurred vision.  Respiratory: Negative for cough, hemoptysis, sputum production, shortness of breath and wheezing.   Cardiovascular: Negative for chest pain, palpitations, orthopnea and claudication.  Gastrointestinal: Negative for abdominal pain, blood in stool, constipation, diarrhea, heartburn, melena, nausea and vomiting.  Genitourinary: Negative for dysuria, flank pain, frequency, hematuria and urgency.  Musculoskeletal: Negative for back pain, joint pain and myalgias.  Skin: Negative for rash.  Neurological: Negative for dizziness, tingling, focal weakness, seizures, weakness and headaches.  Endo/Heme/Allergies: Does not bruise/bleed easily.  Psychiatric/Behavioral: Negative for depression and suicidal ideas. The patient does not have insomnia.       Allergies  Allergen Reactions  . Sulfa Antibiotics Other (See Comments)    Joint pain  . Ace Inhibitors Cough  . Invokana [Canagliflozin] Other (See Comments)    Leg pain  . Penicillins Rash  Has patient had a  PCN reaction causing immediate rash, facial/tongue/throat swelling, SOB or lightheadedness with hypotension: Yes Has patient had a PCN reaction causing severe rash involving mucus membranes or skin necrosis: No Has patient had a PCN reaction that required hospitalization: No Has patient had a PCN reaction occurring within the last 10 years: No If all of the above answers are "NO", then may proceed with Cephalosporin use.     Past Medical History:  Diagnosis Date  . Acid reflux   . Anxiety   . Depression   . Diabetes mellitus without complication ([place])   . Hyperlipidemia   . Hypertension   . Myocardial infarction ([place]) 1995  . Sleep apnea   . Urothelial cancer ([place]) 11/2017   Left Urothelial mass, chemo tx's     Past Surgical History:  Procedure Laterality Date  . CATARACT EXTRACTION Left   . COLONOSCOPY  2010   [facility]  . COLONOSCOPY WITH PROPOFOL N/A 10/04/2016   Procedure: COLONOSCOPY WITH PROPOFOL;  Surgeon: [name], MD;  Location: [facility] ENDOSCOPY;  Service: Endoscopy;  Laterality: N/A;  . [place], 2000, 2001, 2014  . CYSTOSCOPY W/ RETROGRADES Bilateral 12/10/2017   Procedure: CYSTOSCOPY WITH RETROGRADE PYELOGRAM;  Surgeon: [name], MD;  Location: [facility] ORS;  Service: Urology;  Laterality: Bilateral;  . CYSTOSCOPY WITH STENT PLACEMENT Left 12/10/2017   Procedure: CYSTOSCOPY WITH STENT PLACEMENT;  Surgeon: [name], MD;  Location: [facility] ORS;  Service: Urology;  Laterality: Left;  . INGUINAL HERNIA REPAIR Right 08/09/2015   Procedure: HERNIA REPAIR INGUINAL ADULT;  Surgeon: [name], MD;  Location: [facility] ORS;  Service: General;  Laterality: Right;  . NASAL SINUS SURGERY    . nuclear stress test    . PORTA CATH INSERTION N/A 12/19/2017   Procedure: PORTA CATH INSERTION;  Surgeon: [name], MD;  Location: [facility] CV LAB;  Service: Cardiovascular;  Laterality: N/A;  . URETERAL BIOPSY Left 12/10/2017    Procedure: URETERAL & renal PELVIS BIOPSY;  Surgeon: [name], MD;  Location: [facility] ORS;  Service: Urology;  Laterality: Left;  . URETEROSCOPY Left 12/10/2017   Procedure: URETEROSCOPY;  Surgeon: [name], MD;  Location: [facility] ORS;  Service: Urology;  Laterality: Left;    Social History   Socioeconomic History  . Marital status: Married    Spouse name: Not on file  . Number of children: 1  . Years of education: Not on file  . Highest education level: Associate degree: occupational, Hotel manager, or vocational program  Occupational History  . Occupation: retired  Scientific laboratory technician  . Financial resource strain: Not hard at all  . Food insecurity:    Worry: Never true    Inability: Never true  . Transportation needs:    Medical: No    Non-medical: No  Tobacco Use  . Smoking status: Former Smoker    Types: Cigars  . Smokeless tobacco: Former Systems developer    Types: Chew  . Tobacco comment: on occasion  Substance and Sexual Activity  . Alcohol use: Not Currently    Comment: rarely  . Drug use: No  . Sexual activity: Not Currently  Lifestyle  . Physical activity:    Days per week: Not on file    Minutes per session: Not on file  . Stress: Only a little  Relationships  . Social connections:    Talks on phone: Not on file    Gets together: Not on file  Attends religious service: Not on file    Active member of club or organization: Not on file    Attends meetings of clubs or organizations: Not on file    Relationship status: Not on file  . Intimate partner violence:    Fear of current or ex partner: Not on file    Emotionally abused: Not on file    Physically abused: Not on file    Forced sexual activity: Not on file  Other Topics Concern  . Not on file  Social History Narrative  . Not on file    Family History  Problem Relation Age of Onset  . Heart disease Mother   . Cancer Father        Lung and colon cancer  . Heart disease Father   . Emphysema Maternal  Grandfather   . Tuberculosis Maternal Grandmother      Current Outpatient Medications:  .  atorvastatin (LIPITOR) 40 MG tablet, Take 1 tablet (40 mg total) by mouth at bedtime., Disp: 90 tablet, Rfl: 3 .  carvedilol (COREG) 12.5 MG tablet, TAKE 1 TABLET BY MOUTH TWICE DAILY, Disp: 180 tablet, Rfl: 3 .  clobetasol cream (TEMOVATE) 6.23 %, Apply 1 application topically as needed (for skin rash)., Disp: 30 g, Rfl: 1 .  empagliflozin (JARDIANCE) 10 MG TABS tablet, Take 10 mg by mouth daily., Disp: 30 tablet, Rfl: 11 .  feeding supplement, ENSURE ENLIVE, (ENSURE ENLIVE) LIQD, Take 237 mLs by mouth 3 (three) times daily between meals. (Patient taking differently: Take 237 mLs by mouth daily as needed. ), Disp: 90 Bottle, Rfl: 0 .  fluticasone (FLONASE) 50 MCG/ACT nasal spray, Place 2 sprays into both nostrils daily., Disp: 16 g, Rfl: 12 .  glucose blood (CONTOUR NEXT TEST) test strip, Checks sugar 4 times daily. DX E11.9-strips for Contour next EZ meter, Disp: 360 each, Rfl: 3 .  hydrOXYzine (ATARAX/VISTARIL) 25 MG tablet, Take 1 tablet (25 mg total) by mouth every 8 (eight) hours as needed for itching., Disp: 30 tablet, Rfl: 3 .  losartan (COZAAR) 100 MG tablet, TAKE 1 TABLET BY MOUTH ONCE DAILY (Patient taking differently: 50 mg. ), Disp: 90 tablet, Rfl: 3 .  metFORMIN (GLUCOPHAGE) 1000 MG tablet, TAKE 1 TABLET BY MOUTH TWICE DAILY WITH MEALS, Disp: 180 tablet, Rfl: 3 .  sertraline (ZOLOFT) 100 MG tablet, Take 1 tablet (100 mg total) by mouth daily., Disp: 30 tablet, Rfl: 5 .  acetaminophen (TYLENOL) 500 MG tablet, Take 500 mg by mouth every 6 (six) hours as needed for moderate pain. When taking oxycodone 5 mg tablets, Disp: , Rfl:  .  Ivermectin (SOOLANTRA) 1 % CREA, Apply topically at bedtime. To face, Disp: , Rfl:  .  lidocaine-prilocaine (EMLA) cream, Apply 1 application topically as needed. (Patient not taking: Reported on 08/19/2018), Disp: 30 g, Rfl: 3 .  nystatin cream (MYCOSTATIN), Apply  topically 2 (two) times daily. (Patient not taking: Reported on 08/19/2018), Disp: 15 g, Rfl: 1 .  ondansetron (ZOFRAN) 8 MG tablet, Take 1 tablet (8 mg total) by mouth every 8 (eight) hours as needed for nausea or vomiting (if needed beginning 3 days after chemotherapy). (Patient not taking: Reported on 08/19/2018), Disp: 20 tablet, Rfl: 0 No current facility-administered medications for this visit.   Facility-Administered Medications Ordered in Other Visits:  .  heparin lock flush 100 unit/mL, 500 Units, Intravenous, Once, Sindy Guadeloupe, MD .  Influenza vac split quadrivalent PF (FLUZONE HIGH-DOSE) injection 0.5 mL, 0.5 mL, Intramuscular, Once, Pearline Cables,  Doris Cheadle, NP .  sodium chloride flush (NS) 0.9 % injection 10 mL, 10 mL, Intravenous, Once, Sindy Guadeloupe, MD  Physical exam:  Vitals:   08/19/18 1325  BP: 133/82  Pulse: (!) 54  Resp: 18  Temp: 97.8 F (36.6 C)  TempSrc: Oral  Weight: 169 lb 12.8 oz (77 kg)   Physical Exam Constitutional:      General: He is not in acute distress. HENT:     Head: Normocephalic and atraumatic.  Eyes:     Pupils: Pupils are equal, round, and reactive to light.  Neck:     Musculoskeletal: Normal range of motion.  Cardiovascular:     Rate and Rhythm: Normal rate and regular rhythm.     Heart sounds: Normal heart sounds.  Pulmonary:     Effort: Pulmonary effort is normal.     Breath sounds: Normal breath sounds.  Abdominal:     General: Bowel sounds are normal.     Palpations: Abdomen is soft.  Skin:    General: Skin is warm and dry.  Neurological:     Mental Status: He is alert and oriented to person, place, and time.      CMP Latest Ref Rng & Units 08/19/2018  Glucose 70 - 99 mg/dL 220(H)  BUN 8 - 23 mg/dL 19  Creatinine 0.61 - 1.24 mg/dL 0.73  Sodium 135 - 145 mmol/L 135  Potassium 3.5 - 5.1 mmol/L 4.2  Chloride 98 - 111 mmol/L 100  CO2 22 - 32 mmol/L 29  Calcium 8.9 - 10.3 mg/dL 9.4  Total Protein 6.5 - 8.1 g/dL 7.3  Total Bilirubin  0.3 - 1.2 mg/dL 0.9  Alkaline Phos 38 - 126 U/L 96  AST 15 - 41 U/L 15  ALT 0 - 44 U/L 12   CBC Latest Ref Rng & Units 08/19/2018  WBC 4.0 - 10.5 K/uL 6.4  Hemoglobin 13.0 - 17.0 g/dL 11.3(L)  Hematocrit 39.0 - 52.0 % 34.7(L)  Platelets 150 - 400 K/uL 204      Assessment and plan- Patient is a 73 y.o. male with stage IV metastatic left urothelial carcinoma TX N2 M0.    He is here for on treatment assessment prior to cycle 4 of Tecentriq  Counts okay to proceed with cycle 4 of Tecentriq today.  He is due for repeat scans on 08/29/2018.  I will see him back in 3 weeks time prior to cycle #5.  He had a mildly elevated TSH in the past which on repeat check on 07/29/2018 was normal.  Continue to monitor  Patient had also been receiving Procrit shots in the past but he does not require that at this time as his hemoglobin is remained stable around 11.  I suspect his anemia in the past was chemo induced    Visit Diagnosis 1. Encounter for antineoplastic immunotherapy   2. Urothelial cancer ([place])      Dr. Randa Evens, MD, MPH [facility] [phone] 08/19/2018 2:12 PM

## 2018-08-28 ENCOUNTER — Other Ambulatory Visit: Payer: Medicare Other

## 2018-08-28 DIAGNOSIS — Z515 Encounter for palliative care: Secondary | ICD-10-CM

## 2018-08-28 NOTE — Progress Notes (Signed)
PATIENT NAME: CRYSTAL ELLWOOD DOB: 05/19/1946 MRN: [id number]  PRIMARY CARE PROVIDER: Jerrol Banana., MD  RESPONSIBLE PARTY:  Acct ID - Guarantor Home Phone Work Phone Relationship Acct Type  [id number] TAYVEON, LOMBARDO [phone]  Self P/F     [address], [place], [place] [address]    PLAN OF CARE and INTERVENTIONS:               1.  GOALS OF CARE/ ADVANCE CARE PLANNING: Remain free of infections and remain at home with wife.               2.  PATIENT/CAREGIVER EDUCATION: Education to signs and symptoms of infection.                4. PERSONAL EMERGENCY PLAN:  In Place               5.  DISEASE STATUS: Patient having increased weakness, continues to receive immunotherapy monthly.  HISTORY OF PRESENT ILLNESS:    CODE STATUS: Full Code  ADVANCED DIRECTIVES:N MOST FORM: N PPS: 60%   PHYSICAL EXAM:   VITALS:See Vital signs  LUNGS: clear to auscultation  CARDIAC: Cor RRR  EXTREMITIES: negative SKIN: Skin color, texture, turgor normal. No rashes or lesions  NEURO: negative except for weakness   Palliative Care home visit to patient's home.  Patient's wife [name] in home with visit.  Patient to have CT scan tomorrow.  Patient continues to receive immunotherapy monthly.  Wife states MD informed them not to expect tumors to show much change as immunotherapy is slower to work than chemo.  Patient and wife are hopeful that tumors will show no growth.  Wife states MD is wanting patient to have tissue biopsy to determine if patient would be eligible for new treatment.  Patient was started on Zoloft and Zoloft was increased to 100 mg daily.  Patient and wife feel patient is doing better since taking Zoloft.  Patient denies having any blood in urine.  Wife reports patient complains of feeling tired frequently.  Patient and wife encouraged to contact Palliative Care with questions or concerns.             Nilda Simmer, RN

## 2018-08-29 ENCOUNTER — Ambulatory Visit
Admission: RE | Admit: 2018-08-29 | Discharge: 2018-08-29 | Disposition: A | Discharge status: Home / Self Care | Payer: Medicare Other | Source: Ambulatory Visit | Attending: Oncology | Admitting: Oncology

## 2018-08-29 DIAGNOSIS — C642 Malignant neoplasm of left kidney, except renal pelvis: Secondary | ICD-10-CM | POA: Insufficient documentation

## 2018-08-29 DIAGNOSIS — Z5112 Encounter for antineoplastic immunotherapy: Secondary | ICD-10-CM | POA: Insufficient documentation

## 2018-08-29 DIAGNOSIS — C801 Malignant (primary) neoplasm, unspecified: Secondary | ICD-10-CM | POA: Diagnosis not present

## 2018-08-29 DIAGNOSIS — K5900 Constipation, unspecified: Secondary | ICD-10-CM | POA: Diagnosis not present

## 2018-08-29 MED ORDER — IOPAMIDOL (ISOVUE-300) INJECTION 61%
100.0000 mL | Freq: Once | INTRAVENOUS | Status: AC | PRN
  Administered 2018-08-29: 100 mL via INTRAVENOUS

## 2018-09-02 ENCOUNTER — Telehealth: Payer: Self-pay | Admitting: *Deleted

## 2018-09-02 NOTE — Telephone Encounter (Signed)
CAl returned to patient and results given per physician response, He was advised to use laxatives for constipation as well. He thanked me for returning his call

## 2018-09-02 NOTE — Telephone Encounter (Signed)
Palled asking for results of CT last week.  IMPRESSION: 1. Roughly similar appearance of tumor along the collecting system and infundibulum of the left inferior and anterior kidney, blocking excretion from the anterior inferior half of the kidney and as a result causing hypoenhancement of this portion of the left kidney. A left periaortic lymph node is mildly reduced in size compared to prior, 1.1 cm today and previously 1.3 cm short axis. 2. Mild wall thickening in the distal esophagus. The most common cause would be esophagitis. 3.  Aortic Atherosclerosis (ICD10-I70.0).  Coronary atherosclerosis. 4. Stable peripheral interstitial accentuation favoring the lung bases, probably from fibrosis. 5. Cholelithiasis. 6.  Prominent stool throughout the colon favors constipation. 7. Mild prostatomegaly.   Electronically Signed   By: Van Clines M.D.   On: 08/29/2018 13:44

## 2018-09-02 NOTE — Telephone Encounter (Signed)
Scans show stable disease. Continue tecentriq. I will discuss at this next visit. You can tell him this. If he is constipated- he needs laxatives

## 2018-09-06 ENCOUNTER — Other Ambulatory Visit: Payer: Self-pay

## 2018-09-06 DIAGNOSIS — D696 Thrombocytopenia, unspecified: Secondary | ICD-10-CM

## 2018-09-09 ENCOUNTER — Other Ambulatory Visit: Payer: Self-pay

## 2018-09-09 ENCOUNTER — Inpatient Hospital Stay: Payer: Medicare Other

## 2018-09-09 ENCOUNTER — Encounter: Payer: Self-pay | Admitting: Oncology

## 2018-09-09 ENCOUNTER — Inpatient Hospital Stay: Payer: Medicare Other | Attending: Oncology

## 2018-09-09 ENCOUNTER — Inpatient Hospital Stay (HOSPITAL_BASED_OUTPATIENT_CLINIC_OR_DEPARTMENT_OTHER): Payer: Medicare Other | Admitting: Oncology

## 2018-09-09 VITALS — BP 124/80 | HR 69 | Temp 97.6°F | Resp 18 | Wt 167.7 lb

## 2018-09-09 DIAGNOSIS — I2510 Atherosclerotic heart disease of native coronary artery without angina pectoris: Secondary | ICD-10-CM

## 2018-09-09 DIAGNOSIS — Z5112 Encounter for antineoplastic immunotherapy: Secondary | ICD-10-CM | POA: Insufficient documentation

## 2018-09-09 DIAGNOSIS — K5900 Constipation, unspecified: Secondary | ICD-10-CM | POA: Diagnosis not present

## 2018-09-09 DIAGNOSIS — D696 Thrombocytopenia, unspecified: Secondary | ICD-10-CM

## 2018-09-09 DIAGNOSIS — E538 Deficiency of other specified B group vitamins: Secondary | ICD-10-CM | POA: Diagnosis not present

## 2018-09-09 DIAGNOSIS — C642 Malignant neoplasm of left kidney, except renal pelvis: Secondary | ICD-10-CM | POA: Insufficient documentation

## 2018-09-09 DIAGNOSIS — E119 Type 2 diabetes mellitus without complications: Secondary | ICD-10-CM | POA: Diagnosis not present

## 2018-09-09 DIAGNOSIS — C689 Malignant neoplasm of urinary organ, unspecified: Secondary | ICD-10-CM

## 2018-09-09 DIAGNOSIS — Z87891 Personal history of nicotine dependence: Secondary | ICD-10-CM

## 2018-09-09 DIAGNOSIS — K209 Esophagitis, unspecified: Secondary | ICD-10-CM | POA: Diagnosis not present

## 2018-09-09 DIAGNOSIS — R599 Enlarged lymph nodes, unspecified: Secondary | ICD-10-CM

## 2018-09-09 DIAGNOSIS — J849 Interstitial pulmonary disease, unspecified: Secondary | ICD-10-CM | POA: Diagnosis not present

## 2018-09-09 LAB — CBC WITH DIFFERENTIAL/PLATELET
Abs Immature Granulocytes: 0.01 10*3/uL (ref 0.00–0.07)
Basophils Absolute: 0 10*3/uL (ref 0.0–0.1)
Basophils Relative: 1 %
Eosinophils Absolute: 0.8 10*3/uL — ABNORMAL HIGH (ref 0.0–0.5)
Eosinophils Relative: 12 %
HCT: 36.5 % — ABNORMAL LOW (ref 39.0–52.0)
Hemoglobin: 12.1 g/dL — ABNORMAL LOW (ref 13.0–17.0)
Immature Granulocytes: 0 %
Lymphocytes Relative: 21 %
Lymphs Abs: 1.5 10*3/uL (ref 0.7–4.0)
MCH: 30 pg (ref 26.0–34.0)
MCHC: 33.2 g/dL (ref 30.0–36.0)
MCV: 90.3 fL (ref 80.0–100.0)
MONO ABS: 0.6 10*3/uL (ref 0.1–1.0)
MONOS PCT: 8 %
Neutro Abs: 4.1 10*3/uL (ref 1.7–7.7)
Neutrophils Relative %: 58 %
Platelets: 189 10*3/uL (ref 150–400)
RBC: 4.04 MIL/uL — ABNORMAL LOW (ref 4.22–5.81)
RDW: 13.9 % (ref 11.5–15.5)
WBC: 7.1 10*3/uL (ref 4.0–10.5)
nRBC: 0 % (ref 0.0–0.2)

## 2018-09-09 LAB — COMPREHENSIVE METABOLIC PANEL
ALT: 12 U/L (ref 0–44)
AST: 16 U/L (ref 15–41)
Albumin: 4 g/dL (ref 3.5–5.0)
Alkaline Phosphatase: 82 U/L (ref 38–126)
Anion gap: 7 (ref 5–15)
BUN: 15 mg/dL (ref 8–23)
CO2: 27 mmol/L (ref 22–32)
Calcium: 9.4 mg/dL (ref 8.9–10.3)
Chloride: 102 mmol/L (ref 98–111)
Creatinine, Ser: 0.8 mg/dL (ref 0.61–1.24)
GFR calc Af Amer: >60 mL/min (ref >60)
GFR calc non Af Amer: >60 mL/min (ref >60)
GLUCOSE: 144 mg/dL — AB (ref 70–99)
Potassium: 4.1 mmol/L (ref 3.5–5.1)
Sodium: 136 mmol/L (ref 135–145)
Total Bilirubin: 0.9 mg/dL (ref 0.3–1.2)
Total Protein: 7.8 g/dL (ref 6.5–8.1)

## 2018-09-09 MED ORDER — SODIUM CHLORIDE 0.9 % IV SOLN
1200.0000 mg | Freq: Once | INTRAVENOUS | Status: AC
  Administered 2018-09-09: 1200 mg via INTRAVENOUS
  Filled 2018-09-09: qty 20

## 2018-09-09 MED ORDER — SODIUM CHLORIDE 0.9 % IV SOLN
Freq: Once | INTRAVENOUS | Status: AC
  Administered 2018-09-09: 14:00:00 via INTRAVENOUS
  Filled 2018-09-09: qty 250

## 2018-09-09 MED ORDER — SODIUM CHLORIDE 0.9% FLUSH
10.0000 mL | Freq: Once | INTRAVENOUS | Status: AC
  Administered 2018-09-09: 10 mL via INTRAVENOUS
  Filled 2018-09-09: qty 10

## 2018-09-09 MED ORDER — HEPARIN SOD (PORK) LOCK FLUSH 100 UNIT/ML IV SOLN
500.0000 [IU] | Freq: Once | INTRAVENOUS | Status: AC
  Administered 2018-09-09: 500 [IU] via INTRAVENOUS
  Filled 2018-09-09: qty 5

## 2018-09-09 NOTE — Progress Notes (Signed)
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 Mike Mcgee so far for his metastatic urothelial carcinoma. This was originally diagnosed in May 2019. He was noted to have a filling defect in the lower pole collecting system of the left kidney along with para-aortic and retroperitoneal adenopathy concerning for metastatic disease. He underwent left ureteroscopy and renal pelvis biopsy which revealed small fragments of high-grade urothelial carcinoma with small focus of invasion. He was started on carboplatin and gemcitabine chemotherapy in June 2019 and was continued on 05/27/2018 for 8 cycles. He tolerated chemotherapy well except for chemo-induced anemia for which she has been getting Procrit every 2 weeks. He did have response to his disease based on scans in August 2019. However repeat scan on 05/31/2018 showed increase in the size of the primary tumor from 1.2 to 1.9 cm and increase in left para-aortic adenopathy from 0.9 to 1.3 cm. Periportal adenopathy was stable at 1.4 cm. Second line immunotherapy was recommended. His initial biopsy specimen did not have enough sample to undergo FGFR mutation  testing. He also has mild interstitial lung disease for which he sees pulmonary but he is not on home oxygen. He has B12 deficiency for which he is on oral B12. Also has diabetes and coronary artery disease.  Tecentriq started on 06/17/2018  Interval history-he feels fatigued and naps often.  He has intermittent constipation which is well controlled with mag citrate.  Denies any difficulty swallowing or symptoms of heartburn.  ECOG PS- 1 Pain scale- 0 Opioid associated constipation- no  Review of systems- Review of Systems  Constitutional: Positive for malaise/fatigue. Negative for chills, fever and weight loss.  HENT: Negative for congestion, ear discharge and nosebleeds.   Eyes: Negative for blurred vision.  Respiratory: Negative for cough, hemoptysis, sputum production, shortness of breath and wheezing.   Cardiovascular: Negative for chest pain, palpitations, orthopnea and claudication.  Gastrointestinal: Positive for constipation. Negative for abdominal pain, blood in stool, diarrhea, heartburn, melena, nausea and vomiting.  Genitourinary: Negative for dysuria, flank pain, frequency, hematuria and urgency.  Musculoskeletal: Negative for back pain, joint pain and myalgias.  Skin: Negative for rash.  Neurological: Negative for dizziness, tingling, focal weakness, seizures, weakness and headaches.  Endo/Heme/Allergies: Does not bruise/bleed easily.  Psychiatric/Behavioral: Negative for depression and suicidal ideas. The patient does not have insomnia.       Allergies  Allergen Reactions  . Sulfa Antibiotics Other (See Comments)    Joint pain  . Ace Inhibitors Cough  . Invokana [Canagliflozin] Other (See Comments)    Leg pain  . Penicillins Rash    Has patient had a PCN reaction causing immediate rash, facial/tongue/throat swelling, SOB or lightheadedness with hypotension: Yes Has patient had a PCN reaction causing severe rash involving mucus membranes or skin necrosis: No Has  patient had a  PCN reaction that required hospitalization: No Has patient had a PCN reaction occurring within the last 10 years: No If all of the above answers are "NO", then may proceed with Cephalosporin use.     Past Medical History:  Diagnosis Date  . Acid reflux   . Anxiety   . Depression   . Diabetes mellitus without complication ([place])   . Hyperlipidemia   . Hypertension   . Myocardial infarction ([place]) 1995  . Sleep apnea   . Urothelial cancer ([place]) 11/2017   Left Urothelial mass, chemo tx's     Past Surgical History:  Procedure Laterality Date  . CATARACT EXTRACTION Left   . COLONOSCOPY  2010   [facility]  . COLONOSCOPY WITH PROPOFOL N/A 10/04/2016   Procedure: COLONOSCOPY WITH PROPOFOL;  Surgeon: [name], MD;  Location: [facility] ENDOSCOPY;  Service: Endoscopy;  Laterality: N/A;  . [place], 2000, 2001, 2014  . CYSTOSCOPY W/ RETROGRADES Bilateral 12/10/2017   Procedure: CYSTOSCOPY WITH RETROGRADE PYELOGRAM;  Surgeon: [name], MD;  Location: [facility] ORS;  Service: Urology;  Laterality: Bilateral;  . CYSTOSCOPY WITH STENT PLACEMENT Left 12/10/2017   Procedure: CYSTOSCOPY WITH STENT PLACEMENT;  Surgeon: [name], MD;  Location: [facility] ORS;  Service: Urology;  Laterality: Left;  . INGUINAL HERNIA REPAIR Right 08/09/2015   Procedure: HERNIA REPAIR INGUINAL ADULT;  Surgeon: [name], MD;  Location: [facility] ORS;  Service: General;  Laterality: Right;  . NASAL SINUS SURGERY    . nuclear stress test    . PORTA CATH INSERTION N/A 12/19/2017   Procedure: PORTA CATH INSERTION;  Surgeon: [name], MD;  Location: [facility];  Service: Cardiovascular;  Laterality: N/A;  . URETERAL BIOPSY Left 12/10/2017   Procedure: URETERAL & renal PELVIS BIOPSY;  Surgeon: [name], MD;  Location: [facility] ORS;  Service: Urology;  Laterality: Left;  . URETEROSCOPY Left 12/10/2017   Procedure: URETEROSCOPY;  Surgeon: [name],  MD;  Location: [facility] ORS;  Service: Urology;  Laterality: Left;    Social History   Socioeconomic History  . Marital status: Married    Spouse name: Not on file  . Number of children: 1  . Years of education: Not on file  . Highest education level: Associate degree: occupational, Hotel manager, or vocational program  Occupational History  . Occupation: retired  Scientific laboratory technician  . Financial resource strain: Not hard at all  . Food insecurity:    Worry: Never true    Inability: Never true  . Transportation needs:    Medical: No    Non-medical: No  Tobacco Use  . Smoking status: Former Smoker    Types: Cigars  . Smokeless tobacco: Former Systems developer    Types: Chew  . Tobacco comment: on occasion  Substance and Sexual Activity  . Alcohol use: Not Currently    Comment: rarely  . Drug use: No  . Sexual activity: Not Currently  Lifestyle  . Physical activity:    Days per week: Not on file    Minutes per session: Not on file  . Stress: Only a little  Relationships  . Social connections:    Talks on phone: Not on file    Gets together: Not on file    Attends religious service: Not on file    Active member of club or organization: Not on file    Attends meetings of clubs or organizations: Not on file    Relationship status: Not on file  .  Intimate partner violence:    Fear of current or ex partner: Not on file    Emotionally abused: Not on file    Physically abused: Not on file    Forced sexual activity: Not on file  Other Topics Concern  . Not on file  Social History Narrative  . Not on file    Family History  Problem Relation Age of Onset  . Heart disease Mother   . Cancer Father        Lung and colon cancer  . Heart disease Father   . Emphysema Maternal Grandfather   . Tuberculosis Maternal Grandmother      Current Outpatient Medications:  .  atorvastatin (LIPITOR) 40 MG tablet, Take 1 tablet (40 mg total) by mouth at bedtime., Disp: 90 tablet, Rfl: 3 .  carvedilol  (COREG) 12.5 MG tablet, TAKE 1 TABLET BY MOUTH TWICE DAILY, Disp: 180 tablet, Rfl: 3 .  glucose blood (CONTOUR NEXT TEST) test strip, Checks sugar 4 times daily. DX E11.9-strips for Contour next EZ meter, Disp: 360 each, Rfl: 3 .  lidocaine-prilocaine (EMLA) cream, Apply 1 application topically as needed., Disp: 30 g, Rfl: 3 .  losartan (COZAAR) 100 MG tablet, TAKE 1 TABLET BY MOUTH ONCE DAILY (Patient taking differently: 50 mg. ), Disp: 90 tablet, Rfl: 3 .  metFORMIN (GLUCOPHAGE) 1000 MG tablet, TAKE 1 TABLET BY MOUTH TWICE DAILY WITH MEALS, Disp: 180 tablet, Rfl: 3 .  nystatin cream (MYCOSTATIN), Apply topically 2 (two) times daily., Disp: 15 g, Rfl: 1 .  sertraline (ZOLOFT) 100 MG tablet, Take 1 tablet (100 mg total) by mouth daily., Disp: 30 tablet, Rfl: 5 .  acetaminophen (TYLENOL) 500 MG tablet, Take 500 mg by mouth every 6 (six) hours as needed for moderate pain. When taking oxycodone 5 mg tablets, Disp: , Rfl:  .  clobetasol cream (TEMOVATE) 2.99 %, Apply 1 application topically as needed (for skin rash). (Patient not taking: Reported on 09/09/2018), Disp: 30 g, Rfl: 1 .  empagliflozin (JARDIANCE) 10 MG TABS tablet, Take 10 mg by mouth daily. (Patient not taking: Reported on 09/09/2018), Disp: 30 tablet, Rfl: 11 .  feeding supplement, ENSURE ENLIVE, (ENSURE ENLIVE) LIQD, Take 237 mLs by mouth 3 (three) times daily between meals. (Patient not taking: Reported on 09/09/2018), Disp: 90 Bottle, Rfl: 0 .  fluticasone (FLONASE) 50 MCG/ACT nasal spray, Place 2 sprays into both nostrils daily. (Patient not taking: Reported on 09/09/2018), Disp: 16 g, Rfl: 12 .  hydrOXYzine (ATARAX/VISTARIL) 25 MG tablet, Take 1 tablet (25 mg total) by mouth every 8 (eight) hours as needed for itching. (Patient not taking: Reported on 09/09/2018), Disp: 30 tablet, Rfl: 3 .  Ivermectin (SOOLANTRA) 1 % CREA, Apply topically at bedtime. To face, Disp: , Rfl:  .  ondansetron (ZOFRAN) 8 MG tablet, Take 1 tablet (8 mg total) by mouth  every 8 (eight) hours as needed for nausea or vomiting (if needed beginning 3 days after chemotherapy). (Patient not taking: Reported on 09/09/2018), Disp: 20 tablet, Rfl: 0 No current facility-administered medications for this visit.   Facility-Administered Medications Ordered in Other Visits:  .  heparin lock flush 100 unit/mL, 500 Units, Intravenous, Once, Sindy Guadeloupe, MD .  Influenza vac split quadrivalent PF (FLUZONE HIGH-DOSE) injection 0.5 mL, 0.5 mL, Intramuscular, Once, Karen Kitchens, NP  Physical exam:  Vitals:   09/09/18 1315  BP: 124/80  Pulse: 69  Resp: 18  Temp: 97.6 F (36.4 C)  TempSrc: Tympanic  Weight: 167 lb  11.2 oz (76.1 kg)   Physical Exam Constitutional:      General: He is not in acute distress. HENT:     Head: Normocephalic and atraumatic.  Eyes:     Pupils: Pupils are equal, round, and reactive to light.  Neck:     Musculoskeletal: Normal range of motion.  Cardiovascular:     Rate and Rhythm: Normal rate and regular rhythm.     Heart sounds: Normal heart sounds.  Pulmonary:     Effort: Pulmonary effort is normal.     Breath sounds: Normal breath sounds.  Abdominal:     General: Bowel sounds are normal.     Palpations: Abdomen is soft.  Skin:    General: Skin is warm and dry.  Neurological:     Mental Status: He is alert and oriented to person, place, and time.      CMP Latest Ref Rng & Units 09/09/2018  Glucose 70 - 99 mg/dL 144(H)  BUN 8 - 23 mg/dL 15  Creatinine 0.61 - 1.24 mg/dL 0.80  Sodium 135 - 145 mmol/L 136  Potassium 3.5 - 5.1 mmol/L 4.1  Chloride 98 - 111 mmol/L 102  CO2 22 - 32 mmol/L 27  Calcium 8.9 - 10.3 mg/dL 9.4  Total Protein 6.5 - 8.1 g/dL 7.8  Total Bilirubin 0.3 - 1.2 mg/dL 0.9  Alkaline Phos 38 - 126 U/L 82  AST 15 - 41 U/L 16  ALT 0 - 44 U/L 12   CBC Latest Ref Rng & Units 09/09/2018  WBC 4.0 - 10.5 K/uL 7.1  Hemoglobin 13.0 - 17.0 g/dL 12.1(L)  Hematocrit 39.0 - 52.0 % 36.5(L)  Platelets 150 - 400 K/uL 189     No images are attached to the encounter.  Ct Chest W Contrast  Result Date: 08/29/2018 CLINICAL DATA:  Transitional cell carcinoma restaging. EXAM: CT CHEST, ABDOMEN, AND PELVIS WITH CONTRAST TECHNIQUE: Multidetector CT imaging of the chest, abdomen and pelvis was performed following the standard protocol during bolus administration of intravenous contrast. CONTRAST:  167mL ISOVUE-300 IOPAMIDOL (ISOVUE-300) INJECTION 61% COMPARISON:  Multiple exams, including 05/31/2018 FINDINGS: CT CHEST FINDINGS Cardiovascular: Right Port-A-Cath tip: SVC. Coronary, aortic arch, and branch vessel atherosclerotic vascular disease. Mediastinum/Nodes: Right lower paratracheal node with fatty hilum, 1.1 cm in short axis, previously 1.2 cm by my measurements. Likely reactive. Mild wall thickening in the distal esophagus. Lungs/Pleura: Stable peripheral interstitial accentuation favoring the lung bases. A component of fibrosis is likely. No findings of metastatic disease to the lungs. Musculoskeletal: Unremarkable CT ABDOMEN PELVIS FINDINGS Hepatobiliary: 4 mm gallstone on image 52/2. Mild gallbladder wall thickening although this may be from contraction. No significant hepatic lesion. No biliary dilatation. Pancreas: Prominently atrophic pancreas. Spleen: Unremarkable Adrenals/Urinary Tract: Bilateral renal cysts. Adrenal glands unremarkable. Abnormal tumor in the left collecting system likely causing obstruction of multiple left-sided infundibulum and leading to the abnormal hypoenhancing appearance of the anterior inferior half of the kidney, which is mildly more pronounced than on the prior exam. The overall burden of tumor along the collecting system and renal hilum appears similar to the 05/31/2018 exam. Contrast from the posterior and superior infundibulum skirts the margin of the collecting system tumor compatible with successful excretion. As result the posterosuperior portion of the kidney demonstrates normal and  expected enhancement. No definite bladder tumor is identified. Delayed postcontrast imaging is only through the kidneys and does not include the distal ureters or urinary bladder. Stomach/Bowel: Prominent stool throughout the colon favors constipation. Vascular/Lymphatic: Aortoiliac atherosclerotic vascular  disease. Left periaortic lymph node 1.1 cm in short axis on image 69/2, previously 1.3 cm. Indistinct hypodensity between the portal vein and celiac artery is stable. Reproductive: Mild vas deferens calcification bilaterally. Mild prostatomegaly. Other: Faint stranding in the central mesentery, similar to prior. Musculoskeletal: Degenerative disc disease at L3-4. IMPRESSION: 1. Roughly similar appearance of tumor along the collecting system and infundibulum of the left inferior and anterior kidney, blocking excretion from the anterior inferior half of the kidney and as a result causing hypoenhancement of this portion of the left kidney. A left periaortic lymph node is mildly reduced in size compared to prior, 1.1 cm today and previously 1.3 cm short axis. 2. Mild wall thickening in the distal esophagus. The most common cause would be esophagitis. 3.  Aortic Atherosclerosis (ICD10-I70.0).  Coronary atherosclerosis. 4. Stable peripheral interstitial accentuation favoring the lung bases, probably from fibrosis. 5. Cholelithiasis. 6.  Prominent stool throughout the colon favors constipation. 7. Mild prostatomegaly. Electronically Signed   By: Van Clines M.D.   On: 08/29/2018 13:44   Ct Abdomen Pelvis W Contrast  Result Date: 08/29/2018 CLINICAL DATA:  Transitional cell carcinoma restaging. EXAM: CT CHEST, ABDOMEN, AND PELVIS WITH CONTRAST TECHNIQUE: Multidetector CT imaging of the chest, abdomen and pelvis was performed following the standard protocol during bolus administration of intravenous contrast. CONTRAST:  117mL ISOVUE-300 IOPAMIDOL (ISOVUE-300) INJECTION 61% COMPARISON:  Multiple exams, including  05/31/2018 FINDINGS: CT CHEST FINDINGS Cardiovascular: Right Port-A-Cath tip: SVC. Coronary, aortic arch, and branch vessel atherosclerotic vascular disease. Mediastinum/Nodes: Right lower paratracheal node with fatty hilum, 1.1 cm in short axis, previously 1.2 cm by my measurements. Likely reactive. Mild wall thickening in the distal esophagus. Lungs/Pleura: Stable peripheral interstitial accentuation favoring the lung bases. A component of fibrosis is likely. No findings of metastatic disease to the lungs. Musculoskeletal: Unremarkable CT ABDOMEN PELVIS FINDINGS Hepatobiliary: 4 mm gallstone on image 52/2. Mild gallbladder wall thickening although this may be from contraction. No significant hepatic lesion. No biliary dilatation. Pancreas: Prominently atrophic pancreas. Spleen: Unremarkable Adrenals/Urinary Tract: Bilateral renal cysts. Adrenal glands unremarkable. Abnormal tumor in the left collecting system likely causing obstruction of multiple left-sided infundibulum and leading to the abnormal hypoenhancing appearance of the anterior inferior half of the kidney, which is mildly more pronounced than on the prior exam. The overall burden of tumor along the collecting system and renal hilum appears similar to the 05/31/2018 exam. Contrast from the posterior and superior infundibulum skirts the margin of the collecting system tumor compatible with successful excretion. As result the posterosuperior portion of the kidney demonstrates normal and expected enhancement. No definite bladder tumor is identified. Delayed postcontrast imaging is only through the kidneys and does not include the distal ureters or urinary bladder. Stomach/Bowel: Prominent stool throughout the colon favors constipation. Vascular/Lymphatic: Aortoiliac atherosclerotic vascular disease. Left periaortic lymph node 1.1 cm in short axis on image 69/2, previously 1.3 cm. Indistinct hypodensity between the portal vein and celiac artery is stable.  Reproductive: Mild vas deferens calcification bilaterally. Mild prostatomegaly. Other: Faint stranding in the central mesentery, similar to prior. Musculoskeletal: Degenerative disc disease at L3-4. IMPRESSION: 1. Roughly similar appearance of tumor along the collecting system and infundibulum of the left inferior and anterior kidney, blocking excretion from the anterior inferior half of the kidney and as a result causing hypoenhancement of this portion of the left kidney. A left periaortic lymph node is mildly reduced in size compared to prior, 1.1 cm today and previously 1.3 cm short axis. 2. Mild wall  thickening in the distal esophagus. The most common cause would be esophagitis. 3.  Aortic Atherosclerosis (ICD10-I70.0).  Coronary atherosclerosis. 4. Stable peripheral interstitial accentuation favoring the lung bases, probably from fibrosis. 5. Cholelithiasis. 6.  Prominent stool throughout the colon favors constipation. 7. Mild prostatomegaly. Electronically Signed   By: Van Clines M.D.   On: 08/29/2018 13:44     Assessment and plan- Patient is a 73 y.o. male with stage IV metastatic left urothelial carcinoma [place] N2 M0.  He is here for on treatment assessment prior to cycle 5 of Tecentriq  I have reviewed CT chest abdomen pelvis images independently and discussed findings with the patient.  He has responded to [employer] well.  Left pelvic mass is stable in size.  Left periaortic lymph node is decreased in size from 1.3 to 1.1 cm.  Discussed continuing Tecentriq until progression or toxicity and repeating scans every 3 months.  Patient verbalized understanding.  Counts okay to proceed with Tecentriq today and I will see him back in 3 weeks time with CBC and CMP for the next cycle of Tecentriq.  Constipation: Findings of constipation noted on CT scan.  Patient does have intermittent constipation but it is well controlled with mag citrate.  Continue to monitor  Patient also noted to have findings  of esophagitis on CT scan.  Clinically patient is asymptomatic and reports no symptoms of heartburn or difficulty swallowing.  Continue to monitor  Visit Diagnosis 1. Encounter for antineoplastic immunotherapy   2. Urothelial cancer ([place])      Dr. Randa Evens, MD, MPH [facility] [phone] 09/09/2018 1:54 PM

## 2018-09-09 NOTE — Progress Notes (Signed)
Here for follow up. "I get tired a lot" per wife pt sleeps a lot .

## 2018-09-24 ENCOUNTER — Other Ambulatory Visit: Payer: Self-pay | Admitting: Family Medicine

## 2018-09-24 DIAGNOSIS — J301 Allergic rhinitis due to pollen: Secondary | ICD-10-CM

## 2018-09-25 NOTE — Telephone Encounter (Signed)
Pharmacy requesting refills. Thanks!  

## 2018-09-26 ENCOUNTER — Other Ambulatory Visit: Payer: Self-pay

## 2018-09-26 ENCOUNTER — Other Ambulatory Visit: Payer: Medicare Other

## 2018-09-26 DIAGNOSIS — Z515 Encounter for palliative care: Secondary | ICD-10-CM

## 2018-09-26 NOTE — Progress Notes (Signed)
PATIENT NAME: Patrick Mcgee DOB: 02/16/1946 MRN: [id number]  PRIMARY CARE PROVIDER: Jerrol Mcgee., MD  RESPONSIBLE PARTY:  Acct ID - Guarantor Home Phone Work Phone Relationship Acct Type  [id number] IVA, POSTEN [phone]  Self P/F     [address], [place], [place] [address]    PLAN OF CARE and INTERVENTIONS:               1.  GOALS OF CARE/ ADVANCE CARE PLANNING:  Continue to receive immunotherapy for as long as possible and remain at home with wife.                 2.  PATIENT/CAREGIVER EDUCATION: Education to patient on signs and symptoms of infection.  Education to patient to avoid crowds as patient is immuno compromised.                3. Patient is a 73 year old patient who resides at home with his wife.  Patient continues to receive immunotherapy and is scheduled to receive therapy on Monday.    HISTORY OF PRESENT ILLNESS:    CODE STATUS: Full Code  ADVANCED DIRECTIVES: Y MOST FORM: No PPS: 60%   PHYSICAL EXAM:   VITALS: Today's Vitals   09/26/18 1430  BP: 126/84  Pulse: (!) 55  Resp: 16  Temp: 98 F (36.7 C)  TempSrc: Temporal  SpO2: 98%  PainSc: 0-No pain    LUNGS: clear to auscultation  CARDIAC: Cor RRR and Cor Brady  EXTREMITIES: none edema SKIN: Skin color, texture, turgor normal. No rashes or lesions  NEURO: negative for gait problems, memory problems and speech problems   Palliative Care visit made with SW Patrick Mcgee.  Patient opens door for SW and Therapist, sports.  Patient's wife Patrick Mcgee in home with patient.  Patient and wife have been staying at home due to 2019-nCoV.  Patient's scan showed no increase in size in tumors per wife.  Patient to receive Immunotherapy treatment on Monday at the cancer center.  Patient denies having any pain.  Wife states patient complains of feeling weak and tired all the time.  Wife states patient stays in bed a lot and sleeps until late in the day.  Patient reports food has no taste and water does not taste good.  RN  discussed patient adding flavor packets to water.  Wife sttes patient likes Gatorade Zero.  Patient continues to take Zoloft and wife states patient's mood is better with taking Zoloft.  Patient states he feels "doped" at times since starting Zoloft.  Patient and wife deny having any questions at the present time.  Patient and wife screened for 2019-NCov via telephone prior to SW and RN's arrival.  Patient and wife encouraged to call with questions or concerns.               Patrick Simmer, RN

## 2018-09-27 ENCOUNTER — Other Ambulatory Visit: Payer: Self-pay

## 2018-09-28 NOTE — Progress Notes (Signed)
COMMUNITY PALLIATIVE CARE SW NOTE  PATIENT NAME: Patrick Mcgee DOB: [birth year] MRN: [id number]  PRIMARY CARE PROVIDER: Jerrol Mcgee., MD  RESPONSIBLE PARTY:  Acct ID - Guarantor Home Phone Work Phone Relationship Acct Type  [id number] Patrick Mcgee [phone]  Self P/F     [place], [place], [place] [address]     PLAN OF CARE and INTERVENTIONS:             1. GOALS OF CARE/ ADVANCE CARE PLANNING:  Patient wants to continue immunotherapy. Patient remains a FULL CODE at this time.  2. SOCIAL/EMOTIONAL/SPIRITUAL ASSESSMENT/ INTERVENTIONS:  Patient continues to get out of the house to exercise and babysit his grandaughter once a week. He continues to have strong faith as a source of support and is accepting of his diagnosis and potential decline/disease progression at some point. No needs at this time.  3. PATIENT/CAREGIVER EDUCATION/ COPING:  Both patient and wife are aware and knowledgeable of his disease. Much education to wife on side affects of immunotherapy as it applies to patient's fatigue. Wife maintains that she would like for him to be more active and not sleep as much. Both are following quarantine protocol due to COVID-19. Ongoing support continues.  4. PERSONAL EMERGENCY PLAN:  Will call 911 for emergencies. They have food and water to sustain them during quarantine.  5. COMMUNITY RESOURCES COORDINATION/ HEALTH CARE NAVIGATION:  Plan is for patient to continue to attend regularly scheduled follow-up appointments and treatment appointments at the [facility].  6. FINANCIAL/LEGAL CONCERNS/INTERVENTIONS:  None at this time.      SOCIAL HX:  Social History   Tobacco Use  . Smoking status: Former Smoker    Types: Cigars  . Smokeless tobacco: Former Systems developer    Types: Chew  . Tobacco comment: on occasion  Substance Use Topics  . Alcohol use: Not Currently    Comment: rarely    CODE STATUS:   Code Status: Prior  ADVANCED DIRECTIVES: N MOST FORM COMPLETE:   no HOSPICE EDUCATION PROVIDED: None this visit  ASN:[id number] is independent for all ADLs.   I spent 70 minutes with patient and family providing support from 2:05pm-3:15pm.      Patrick Mcgee

## 2018-09-30 ENCOUNTER — Ambulatory Visit: Payer: PRIVATE HEALTH INSURANCE | Admitting: Oncology

## 2018-09-30 ENCOUNTER — Inpatient Hospital Stay (HOSPITAL_BASED_OUTPATIENT_CLINIC_OR_DEPARTMENT_OTHER): Payer: Medicare Other | Admitting: Oncology

## 2018-09-30 ENCOUNTER — Other Ambulatory Visit: Payer: Self-pay

## 2018-09-30 ENCOUNTER — Encounter: Payer: Self-pay | Admitting: Oncology

## 2018-09-30 ENCOUNTER — Inpatient Hospital Stay: Payer: Medicare Other

## 2018-09-30 ENCOUNTER — Other Ambulatory Visit: Payer: PRIVATE HEALTH INSURANCE

## 2018-09-30 ENCOUNTER — Ambulatory Visit: Payer: PRIVATE HEALTH INSURANCE

## 2018-09-30 VITALS — BP 109/70 | HR 62 | Temp 96.7°F | Resp 18 | Ht 68.0 in | Wt 163.7 lb

## 2018-09-30 DIAGNOSIS — C642 Malignant neoplasm of left kidney, except renal pelvis: Secondary | ICD-10-CM | POA: Diagnosis not present

## 2018-09-30 DIAGNOSIS — I2510 Atherosclerotic heart disease of native coronary artery without angina pectoris: Secondary | ICD-10-CM | POA: Diagnosis not present

## 2018-09-30 DIAGNOSIS — E119 Type 2 diabetes mellitus without complications: Secondary | ICD-10-CM | POA: Diagnosis not present

## 2018-09-30 DIAGNOSIS — C689 Malignant neoplasm of urinary organ, unspecified: Secondary | ICD-10-CM

## 2018-09-30 DIAGNOSIS — Z5112 Encounter for antineoplastic immunotherapy: Secondary | ICD-10-CM | POA: Diagnosis not present

## 2018-09-30 DIAGNOSIS — Z87891 Personal history of nicotine dependence: Secondary | ICD-10-CM | POA: Diagnosis not present

## 2018-09-30 DIAGNOSIS — D696 Thrombocytopenia, unspecified: Secondary | ICD-10-CM

## 2018-09-30 DIAGNOSIS — Z95828 Presence of other vascular implants and grafts: Secondary | ICD-10-CM

## 2018-09-30 LAB — COMPREHENSIVE METABOLIC PANEL
ALT: 15 U/L (ref 0–44)
AST: 19 U/L (ref 15–41)
Albumin: 4.1 g/dL (ref 3.5–5.0)
Alkaline Phosphatase: 93 U/L (ref 38–126)
Anion gap: 9 (ref 5–15)
BILIRUBIN TOTAL: 0.9 mg/dL (ref 0.3–1.2)
BUN: 18 mg/dL (ref 8–23)
CO2: 28 mmol/L (ref 22–32)
Calcium: 9.9 mg/dL (ref 8.9–10.3)
Chloride: 100 mmol/L (ref 98–111)
Creatinine, Ser: 0.81 mg/dL (ref 0.61–1.24)
GFR calc Af Amer: >60 mL/min (ref >60)
GFR calc non Af Amer: >60 mL/min (ref >60)
Glucose, Bld: 154 mg/dL — ABNORMAL HIGH (ref 70–99)
Potassium: 4.2 mmol/L (ref 3.5–5.1)
Sodium: 137 mmol/L (ref 135–145)
Total Protein: 7.8 g/dL (ref 6.5–8.1)

## 2018-09-30 LAB — CBC WITH DIFFERENTIAL/PLATELET
Abs Immature Granulocytes: 0.01 10*3/uL (ref 0.00–0.07)
Basophils Absolute: 0.1 10*3/uL (ref 0.0–0.1)
Basophils Relative: 1 %
Eosinophils Absolute: 0.4 10*3/uL (ref 0.0–0.5)
Eosinophils Relative: 6 %
HEMATOCRIT: 37.4 % — AB (ref 39.0–52.0)
HEMOGLOBIN: 12.5 g/dL — AB (ref 13.0–17.0)
Immature Granulocytes: 0 %
LYMPHS PCT: 23 %
Lymphs Abs: 1.5 10*3/uL (ref 0.7–4.0)
MCH: 29.7 pg (ref 26.0–34.0)
MCHC: 33.4 g/dL (ref 30.0–36.0)
MCV: 88.8 fL (ref 80.0–100.0)
Monocytes Absolute: 0.7 10*3/uL (ref 0.1–1.0)
Monocytes Relative: 10 %
NEUTROS ABS: 4.1 10*3/uL (ref 1.7–7.7)
Neutrophils Relative %: 60 %
Platelets: 192 10*3/uL (ref 150–400)
RBC: 4.21 MIL/uL — ABNORMAL LOW (ref 4.22–5.81)
RDW: 13.7 % (ref 11.5–15.5)
WBC: 6.8 10*3/uL (ref 4.0–10.5)
nRBC: 0 % (ref 0.0–0.2)

## 2018-09-30 MED ORDER — SODIUM CHLORIDE 0.9% FLUSH
10.0000 mL | Freq: Once | INTRAVENOUS | Status: AC
  Administered 2018-09-30: 10 mL via INTRAVENOUS
  Filled 2018-09-30: qty 10

## 2018-09-30 MED ORDER — SODIUM CHLORIDE 0.9 % IV SOLN
1200.0000 mg | Freq: Once | INTRAVENOUS | Status: AC
  Administered 2018-09-30: 1200 mg via INTRAVENOUS
  Filled 2018-09-30: qty 20

## 2018-09-30 MED ORDER — HEPARIN SOD (PORK) LOCK FLUSH 100 UNIT/ML IV SOLN
500.0000 [IU] | Freq: Once | INTRAVENOUS | Status: AC | PRN
  Administered 2018-09-30: 500 [IU]

## 2018-09-30 MED ORDER — SODIUM CHLORIDE 0.9 % IV SOLN
Freq: Once | INTRAVENOUS | Status: AC
  Administered 2018-09-30: 12:00:00 via INTRAVENOUS
  Filled 2018-09-30: qty 250

## 2018-09-30 NOTE — Progress Notes (Signed)
No concerns but wants to know if he can get teeth cleaning when dentist starts bac to open

## 2018-10-02 NOTE — Progress Notes (Signed)
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 [name] so far for his metastatic urothelial carcinoma. This was originally diagnosed in May 2019. He was noted to have a filling defect in the lower pole collecting system of the left kidney along with para-aortic and retroperitoneal adenopathy concerning for metastatic disease. He underwent left ureteroscopy and renal pelvis biopsy which revealed small fragments of high-grade urothelial carcinoma with small focus of invasion. He was started on carboplatin and gemcitabine chemotherapy in June 2019 and was continued on 05/27/2018 for 8 cycles. He tolerated chemotherapy well except for chemo-induced anemia for which she has been getting Procrit every 2 weeks. He did have response to his disease based on scans in August 2019. However repeat scan on 05/31/2018 showed increase in the size of the primary tumor from 1.2 to 1.9 cm and increase in left para-aortic adenopathy from 0.9 to 1.3 cm. Periportal adenopathy was stable at 1.4 cm. Second line immunotherapy was recommended. His initial biopsy specimen did not have enough sample to undergo FGFR mutation  testing. He also has mild interstitial lung disease for which he sees pulmonary but he is not on home oxygen. He has B12 deficiency for which he is on oral B12. Also has diabetes and coronary artery disease.Tecentriq started on 06/17/2018   Interval history-he is tolerating Tecentriq well and denies any symptoms of fever, cough, cold or shortness of breath.  Denies any nausea vomiting or diarrhea.  ECOG PS- 1 Pain scale- 0   Review of systems- Review of Systems  Constitutional: Positive for malaise/fatigue. Negative for chills, fever and weight loss.  HENT: Negative for congestion, ear discharge and nosebleeds.   Eyes: Negative for blurred vision.  Respiratory: Negative for cough, hemoptysis, sputum production, shortness of breath and wheezing.   Cardiovascular: Negative for chest pain, palpitations, orthopnea and claudication.  Gastrointestinal: Negative for abdominal pain, blood in stool, constipation, diarrhea, heartburn, melena, nausea and vomiting.  Genitourinary: Negative for dysuria, flank pain, frequency, hematuria and urgency.  Musculoskeletal: Negative for back pain, joint pain and myalgias.  Skin: Negative for rash.  Neurological: Negative for dizziness, tingling, focal weakness, seizures, weakness and headaches.  Endo/Heme/Allergies: Does not bruise/bleed easily.  Psychiatric/Behavioral: Negative for depression and suicidal ideas. The patient does not have insomnia.      Allergies  Allergen Reactions  . Sulfa Antibiotics Other (See Comments)    Joint pain  . Ace Inhibitors Cough  . Invokana [Canagliflozin] Other (See Comments)    Leg pain  . Penicillins Rash    Has patient had a PCN reaction causing immediate rash, facial/tongue/throat swelling, SOB or lightheadedness with hypotension: Yes Has patient had a PCN reaction causing severe rash involving mucus membranes or skin necrosis: No Has patient had a PCN reaction that required hospitalization: No Has patient had  a PCN reaction  occurring within the last 10 years: No If all of the above answers are "NO", then may proceed with Cephalosporin use.     Past Medical History:  Diagnosis Date  . Acid reflux   . Anxiety   . Depression   . Diabetes mellitus without complication ([place])   . Hyperlipidemia   . Hypertension   . Myocardial infarction ([place]) 1995  . Sleep apnea   . Urothelial cancer ([place]) 11/2017   Left Urothelial mass, chemo tx's     Past Surgical History:  Procedure Laterality Date  . CATARACT EXTRACTION Left   . COLONOSCOPY  2010   [facility]  . COLONOSCOPY WITH PROPOFOL N/A 10/04/2016   Procedure: COLONOSCOPY WITH PROPOFOL;  Surgeon: [name], MD;  Location: [facility] ENDOSCOPY;  Service: Endoscopy;  Laterality: N/A;  . [place], 2000, 2001, 2014  . CYSTOSCOPY W/ RETROGRADES Bilateral 12/10/2017   Procedure: CYSTOSCOPY WITH RETROGRADE PYELOGRAM;  Surgeon: [name], MD;  Location: [facility] ORS;  Service: Urology;  Laterality: Bilateral;  . CYSTOSCOPY WITH STENT PLACEMENT Left 12/10/2017   Procedure: CYSTOSCOPY WITH STENT PLACEMENT;  Surgeon: [name], MD;  Location: [facility] ORS;  Service: Urology;  Laterality: Left;  . INGUINAL HERNIA REPAIR Right 08/09/2015   Procedure: HERNIA REPAIR INGUINAL ADULT;  Surgeon: [name], MD;  Location: [facility] ORS;  Service: General;  Laterality: Right;  . NASAL SINUS SURGERY    . nuclear stress test    . PORTA CATH INSERTION N/A 12/19/2017   Procedure: PORTA CATH INSERTION;  Surgeon: [name], MD;  Location: [place] CV LAB;  Service: Cardiovascular;  Laterality: N/A;  . URETERAL BIOPSY Left 12/10/2017   Procedure: URETERAL & renal PELVIS BIOPSY;  Surgeon: [name], MD;  Location: [facility] ORS;  Service: Urology;  Laterality: Left;  . URETEROSCOPY Left 12/10/2017   Procedure: URETEROSCOPY;  Surgeon: [name], MD;  Location: [facility] ORS;  Service: Urology;  Laterality: Left;    Social  History   Socioeconomic History  . Marital status: Married    Spouse name: Not on file  . Number of children: 1  . Years of education: Not on file  . Highest education level: Associate degree: occupational, Hotel manager, or vocational program  Occupational History  . Occupation: retired  Scientific laboratory technician  . Financial resource strain: Not hard at all  . Food insecurity:    Worry: Never true    Inability: Never true  . Transportation needs:    Medical: No    Non-medical: No  Tobacco Use  . Smoking status: Former Smoker    Types: Cigars  . Smokeless tobacco: Former Systems developer    Types: Chew  . Tobacco comment: on occasion  Substance and Sexual Activity  . Alcohol use: Not Currently    Comment: rarely  . Drug use: No  . Sexual activity: Not Currently  Lifestyle  . Physical activity:    Days per week: Not on file    Minutes per session: Not on file  . Stress: Only a little  Relationships  . Social connections:    Talks on phone: Not on file    Gets together: Not on file    Attends religious service: Not on file    Active member of club or organization: Not on file    Attends meetings of clubs or organizations: Not on file    Relationship status: Not on file  . Intimate partner violence:    Fear of current or  ex partner: Not on file    Emotionally abused: Not on file    Physically abused: Not on file    Forced sexual activity: Not on file  Other Topics Concern  . Not on file  Social History Narrative  . Not on file    Family History  Problem Relation Age of Onset  . Heart disease Mother   . Cancer Father        Lung and colon cancer  . Heart disease Father   . Emphysema Maternal Grandfather   . Tuberculosis Maternal Grandmother      Current Outpatient Medications:  .  acetaminophen (TYLENOL) 500 MG tablet, Take 500 mg by mouth every 6 (six) hours as needed for moderate pain. When taking oxycodone 5 mg tablets, Disp: , Rfl:  .  atorvastatin (LIPITOR) 40 MG tablet, Take 1  tablet (40 mg total) by mouth at bedtime., Disp: 90 tablet, Rfl: 3 .  carvedilol (COREG) 12.5 MG tablet, TAKE 1 TABLET BY MOUTH TWICE DAILY, Disp: 180 tablet, Rfl: 3 .  clobetasol cream (TEMOVATE) 0.01 %, Apply 1 application topically as needed (for skin rash)., Disp: 30 g, Rfl: 1 .  empagliflozin (JARDIANCE) 10 MG TABS tablet, Take 10 mg by mouth daily., Disp: 30 tablet, Rfl: 11 .  fluticasone (FLONASE) 50 MCG/ACT nasal spray, Use 2 spray(s) in each nostril once daily, Disp: 48 g, Rfl: 3 .  glucose blood (CONTOUR NEXT TEST) test strip, Checks sugar 4 times daily. DX E11.9-strips for Contour next EZ meter, Disp: 360 each, Rfl: 3 .  hydrOXYzine (ATARAX/VISTARIL) 25 MG tablet, Take 1 tablet (25 mg total) by mouth every 8 (eight) hours as needed for itching., Disp: 30 tablet, Rfl: 3 .  Ivermectin (SOOLANTRA) 1 % CREA, Apply topically at bedtime. To face, Disp: , Rfl:  .  lidocaine-prilocaine (EMLA) cream, Apply 1 application topically as needed., Disp: 30 g, Rfl: 3 .  losartan (COZAAR) 50 MG tablet, Take 50 mg by mouth daily., Disp: , Rfl:  .  metFORMIN (GLUCOPHAGE) 1000 MG tablet, TAKE 1 TABLET BY MOUTH TWICE DAILY WITH MEALS, Disp: 180 tablet, Rfl: 3 .  sertraline (ZOLOFT) 100 MG tablet, Take 1 tablet (100 mg total) by mouth daily., Disp: 30 tablet, Rfl: 5 .  feeding supplement, ENSURE ENLIVE, (ENSURE ENLIVE) LIQD, Take 237 mLs by mouth 3 (three) times daily between meals. (Patient not taking: Reported on 09/09/2018), Disp: 90 Bottle, Rfl: 0 .  losartan (COZAAR) 100 MG tablet, TAKE 1 TABLET BY MOUTH ONCE DAILY (Patient taking differently: 50 mg. ), Disp: 90 tablet, Rfl: 3 .  nystatin cream (MYCOSTATIN), Apply topically 2 (two) times daily. (Patient taking differently: Apply 1 application topically 2 (two) times daily as needed. ), Disp: 15 g, Rfl: 1 .  ondansetron (ZOFRAN) 8 MG tablet, Take 1 tablet (8 mg total) by mouth every 8 (eight) hours as needed for nausea or vomiting (if needed beginning 3 days  after chemotherapy). (Patient not taking: Reported on 09/09/2018), Disp: 20 tablet, Rfl: 0 No current facility-administered medications for this visit.   Facility-Administered Medications Ordered in Other Visits:  .  Influenza vac split quadrivalent PF (FLUZONE HIGH-DOSE) injection 0.5 mL, 0.5 mL, Intramuscular, Once, Karen Kitchens, NP  Physical exam:  Vitals:   09/30/18 1000  BP: 109/70  Pulse: 62  Resp: 18  Temp: (!) 96.7 F (35.9 C)  TempSrc: Tympanic  Weight: 163 lb 11.2 oz (74.3 kg)  Height: 5\' 8"  (1.727 m)   Physical Exam Constitutional:  General: He is not in acute distress. HENT:     Head: Normocephalic and atraumatic.  Eyes:     Pupils: Pupils are equal, round, and reactive to light.  Neck:     Musculoskeletal: Normal range of motion.  Cardiovascular:     Rate and Rhythm: Normal rate and regular rhythm.     Heart sounds: Normal heart sounds.  Pulmonary:     Effort: Pulmonary effort is normal.     Breath sounds: Normal breath sounds.  Abdominal:     General: Bowel sounds are normal.     Palpations: Abdomen is soft.  Skin:    General: Skin is warm and dry.  Neurological:     Mental Status: He is alert and oriented to person, place, and time.      CMP Latest Ref Rng & Units 09/30/2018  Glucose 70 - 99 mg/dL 154(H)  BUN 8 - 23 mg/dL 18  Creatinine 0.61 - 1.24 mg/dL 0.81  Sodium 135 - 145 mmol/L 137  Potassium 3.5 - 5.1 mmol/L 4.2  Chloride 98 - 111 mmol/L 100  CO2 22 - 32 mmol/L 28  Calcium 8.9 - 10.3 mg/dL 9.9  Total Protein 6.5 - 8.1 g/dL 7.8  Total Bilirubin 0.3 - 1.2 mg/dL 0.9  Alkaline Phos 38 - 126 U/L 93  AST 15 - 41 U/L 19  ALT 0 - 44 U/L 15   CBC Latest Ref Rng & Units 09/30/2018  WBC 4.0 - 10.5 K/uL 6.8  Hemoglobin 13.0 - 17.0 g/dL 12.5(L)  Hematocrit 39.0 - 52.0 % 37.4(L)  Platelets 150 - 400 K/uL 192     Assessment and plan- Patient is a 73 y.o. male with stage IV metastatic left urothelial carcinoma TX N2 M0.   He is here for on  treatment assessment prior to cycle 6 of Tecentriq  Counts okay to proceed with cycle 6 of Tecentriq today.  I will see him back in 3 weeks time with CBC with differential and CMP for cycle 7.  He will have his TSH drawn at next visit as well.  Scans from 08/29/2018 showed stable disease.  Repeat scans in May 2020.   Visit Diagnosis 1. Encounter for antineoplastic immunotherapy   2. Urothelial cancer ([place])      Dr. Randa Evens, MD, MPH [facility] [phone] 10/02/2018 8:51 AM

## 2018-10-07 ENCOUNTER — Telehealth: Payer: Self-pay

## 2018-10-07 DIAGNOSIS — F329 Major depressive disorder, single episode, unspecified: Secondary | ICD-10-CM

## 2018-10-07 DIAGNOSIS — F32A Depression, unspecified: Secondary | ICD-10-CM

## 2018-10-07 NOTE — Telephone Encounter (Signed)
Patient calling wanting [place] to change the quantity of the remaining refills of Zoloft to 90 day supply instead of 30 day. He states he is having to go out more frequently to pick up prescriptions and he would rather stay at home to reduce his exposure to sick people. Please advise. ([facility] [address])

## 2018-10-14 ENCOUNTER — Ambulatory Visit: Payer: Self-pay | Admitting: Family Medicine

## 2018-10-17 ENCOUNTER — Other Ambulatory Visit: Payer: Self-pay

## 2018-10-18 ENCOUNTER — Inpatient Hospital Stay: Payer: Medicare Other | Admitting: Oncology

## 2018-10-20 NOTE — Progress Notes (Signed)
[place]  [phone] Fax:(336) [phone]  ID: Patrick Mcgee OB: 12-Nov-1945  MR#: [id number]  XFG#:[id number]  Patient Care Team: Jerrol Banana., MD as PCP - General (Family Medicine) Dingeldein, [name], MD as Consulting Physician (Ophthalmology) [name] as Consulting Physician (Internal Medicine) Patrick Espy, MD as Consulting Physician (Urology) [name], MD as Referring Physician (Hematology and Oncology) [name], MD as Consulting Physician (Pulmonary Disease)  CHIEF COMPLAINT: Stage IV urothelial carcinoma.  INTERVAL HISTORY: Patient returns to clinic today for further evaluation and consideration of cycle 7 of Tecentriq.  He continues to tolerate his treatments well without significant side effects.  He currently feels well and is asymptomatic.  He admits to a poor appetite, but his weight is relatively stable.  He has no neurologic complaints.  He denies any recent fevers or illnesses.  He has no chest pain, shortness of breath, cough, or hemoptysis.  He denies any nausea, vomiting, constipation, or diarrhea.  He has no urinary complaints.  Patient feels approximately his baseline offers no further specific complaints today.  REVIEW OF SYSTEMS:   Review of Systems  Constitutional: Negative.  Negative for fever, malaise/fatigue and weight loss.  Respiratory: Negative.  Negative for hemoptysis and shortness of breath.   Cardiovascular: Negative.  Negative for chest pain and leg swelling.  Gastrointestinal: Negative.  Negative for abdominal pain, nausea and vomiting.  Genitourinary: Negative.  Negative for dysuria and hematuria.  Musculoskeletal: Negative.  Negative for back pain.  Skin: Negative.  Negative for rash.  Neurological: Negative.  Negative for dizziness, focal weakness, weakness and headaches.  Psychiatric/Behavioral: Negative.  The patient is not nervous/anxious.     As per HPI. Otherwise, a  complete review of systems is negative.  PAST MEDICAL HISTORY: Past Medical History:  Diagnosis Date  . Acid reflux   . Anxiety   . Depression   . Diabetes mellitus without complication (Patrick Mcgee)   . Hyperlipidemia   . Hypertension   . Myocardial infarction (Patrick Mcgee) 1995  . Sleep apnea   . Urothelial cancer (Patrick Mcgee) 11/2017   Left Urothelial mass, chemo tx's    PAST SURGICAL HISTORY: Past Surgical History:  Procedure Laterality Date  . CATARACT EXTRACTION Left   . COLONOSCOPY  2010   [facility]  . COLONOSCOPY WITH PROPOFOL N/A 10/04/2016   Procedure: COLONOSCOPY WITH PROPOFOL;  Surgeon: Patrick Silvas, MD;  Location: [facility] ENDOSCOPY;  Service: Endoscopy;  Laterality: N/A;  . [place], 2000, 2001, 2014  . CYSTOSCOPY W/ RETROGRADES Bilateral 12/10/2017   Procedure: CYSTOSCOPY WITH RETROGRADE PYELOGRAM;  Surgeon: Patrick Espy, MD;  Location: [facility] ORS;  Service: Urology;  Laterality: Bilateral;  . CYSTOSCOPY WITH STENT PLACEMENT Left 12/10/2017   Procedure: CYSTOSCOPY WITH STENT PLACEMENT;  Surgeon: Patrick Espy, MD;  Location: [facility] ORS;  Service: Urology;  Laterality: Left;  . INGUINAL HERNIA REPAIR Right 08/09/2015   Procedure: HERNIA REPAIR INGUINAL ADULT;  Surgeon: Patrick Bellow, MD;  Location: [facility] ORS;  Service: General;  Laterality: Right;  . NASAL SINUS SURGERY    . nuclear stress test    . PORTA CATH INSERTION N/A 12/19/2017   Procedure: PORTA CATH INSERTION;  Surgeon: Patrick Huxley, MD;  Location: [place] CV LAB;  Service: Cardiovascular;  Laterality: N/A;  . URETERAL BIOPSY Left 12/10/2017   Procedure: URETERAL & renal PELVIS BIOPSY;  Surgeon: Patrick Espy, MD;  Location: [facility] ORS;  Service: Urology;  Laterality: Left;  . URETEROSCOPY Left  12/10/2017   Procedure: URETEROSCOPY;  Surgeon: Patrick Espy, MD;  Location: [facility] ORS;  Service: Urology;  Laterality: Left;    FAMILY HISTORY: Family History  Problem Relation Age of Onset   . Heart disease Mother   . Cancer Father        Lung and colon cancer  . Heart disease Father   . Emphysema Maternal Grandfather   . Tuberculosis Maternal Grandmother     ADVANCED DIRECTIVES (Y/N):  N  HEALTH MAINTENANCE: Social History   Tobacco Use  . Smoking status: Former Smoker    Types: Cigars  . Smokeless tobacco: Former Systems developer    Types: Chew  . Tobacco comment: on occasion  Substance Use Topics  . Alcohol use: Not Currently    Comment: rarely  . Drug use: No     Colonoscopy:  PAP:  Bone density:  Lipid panel:  Allergies  Allergen Reactions  . Sulfa Antibiotics Other (See Comments)    Joint pain  . Ace Inhibitors Cough  . Invokana [Canagliflozin] Other (See Comments)    Leg pain  . Penicillins Rash    Has patient had a PCN reaction causing immediate rash, facial/tongue/throat swelling, SOB or lightheadedness with hypotension: Yes Has patient had a PCN reaction causing severe rash involving mucus membranes or skin necrosis: No Has patient had a PCN reaction that required hospitalization: No Has patient had a PCN reaction occurring within the last 10 years: No If all of the above answers are "NO", then may proceed with Cephalosporin use.    Current Outpatient Medications  Medication Sig Dispense Refill  . acetaminophen (TYLENOL) 500 MG tablet Take 500 mg by mouth every 6 (six) hours as needed for moderate pain. When taking oxycodone 5 mg tablets    . atorvastatin (LIPITOR) 40 MG tablet Take 1 tablet (40 mg total) by mouth at bedtime. 90 tablet 3  . carvedilol (COREG) 12.5 MG tablet TAKE 1 TABLET BY MOUTH TWICE DAILY 180 tablet 3  . clobetasol cream (TEMOVATE) 3.29 % Apply 1 application topically as needed (for skin rash). 30 g 1  . empagliflozin (JARDIANCE) 10 MG TABS tablet Take 10 mg by mouth daily. 30 tablet 11  . feeding supplement, ENSURE ENLIVE, (ENSURE ENLIVE) LIQD Take 237 mLs by mouth 3 (three) times daily between meals. 90 Bottle 0  . fluticasone  (FLONASE) 50 MCG/ACT nasal spray Use 2 spray(s) in each nostril once daily 48 g 3  . glucose blood (CONTOUR NEXT TEST) test strip Checks sugar 4 times daily. DX E11.9-strips for Contour next EZ meter 360 each 3  . hydrOXYzine (ATARAX/VISTARIL) 25 MG tablet Take 1 tablet (25 mg total) by mouth every 8 (eight) hours as needed for itching. 30 tablet 3  . Ivermectin (SOOLANTRA) 1 % CREA Apply topically at bedtime. To face    . lidocaine-prilocaine (EMLA) cream Apply 1 application topically as needed. 30 g 3  . losartan (COZAAR) 100 MG tablet TAKE 1 TABLET BY MOUTH ONCE DAILY 90 tablet 3  . losartan (COZAAR) 50 MG tablet Take 50 mg by mouth daily.    . metFORMIN (GLUCOPHAGE) 1000 MG tablet TAKE 1 TABLET BY MOUTH TWICE DAILY WITH MEALS 180 tablet 3  . nystatin cream (MYCOSTATIN) Apply topically 2 (two) times daily. 15 g 1  . ondansetron (ZOFRAN) 8 MG tablet Take 1 tablet (8 mg total) by mouth every 8 (eight) hours as needed for nausea or vomiting (if needed beginning 3 days after chemotherapy). 20 tablet 0  . sertraline (  ZOLOFT) 100 MG tablet Take 1 tablet (100 mg total) by mouth daily. 30 tablet 5   No current facility-administered medications for this visit.    Facility-Administered Medications Ordered in Other Visits  Medication Dose Route Frequency Provider Last Rate Last Dose  . Influenza vac split quadrivalent PF (FLUZONE HIGH-DOSE) injection 0.5 mL  0.5 mL Intramuscular Once Karen Kitchens, NP        OBJECTIVE: Vitals:   10/21/18 0945  BP: 101/63  Pulse: 67  Temp: (!) 96.6 F (35.9 C)     Body mass index is 24.78 kg/m.    ECOG FS:0 - Asymptomatic  General: Well-developed, well-nourished, no acute distress. Eyes: Pink conjunctiva, anicteric sclera. HEENT: Normocephalic, moist mucous membranes. Lungs: Clear to auscultation bilaterally. Heart: Regular rate and rhythm. No rubs, murmurs, or gallops. Abdomen: Soft, nontender, nondistended. No organomegaly noted, normoactive bowel sounds.  Musculoskeletal: No edema, cyanosis, or clubbing. Neuro: Alert, answering all questions appropriately. Cranial nerves grossly intact. Skin: No rashes or petechiae noted. Psych: Normal affect.   LAB RESULTS:  Lab Results  Component Value Date   NA 136 10/21/2018   K 4.0 10/21/2018   CL 102 10/21/2018   CO2 26 10/21/2018   GLUCOSE 168 (H) 10/21/2018   BUN 17 10/21/2018   CREATININE 0.83 10/21/2018   CALCIUM 9.5 10/21/2018   PROT 7.3 10/21/2018   ALBUMIN 3.9 10/21/2018   AST 17 10/21/2018   ALT 11 10/21/2018   ALKPHOS 77 10/21/2018   BILITOT 0.8 10/21/2018   GFRNONAA >60 10/21/2018   GFRAA >60 10/21/2018    Lab Results  Component Value Date   WBC 6.3 10/21/2018   NEUTROABS 3.8 10/21/2018   HGB 11.9 (L) 10/21/2018   HCT 36.3 (L) 10/21/2018   MCV 89.2 10/21/2018   PLT 193 10/21/2018     STUDIES: No results found.  ASSESSMENT: Stage IV urothelial carcinoma.  PLAN:    1.  Stage IV urothelial carcinoma: Proceed with cycle 7 of Tecentriq today.  His most recent imaging on August 29, 2018 revealed stable disease.  Plan to repeat imaging in May 2020.  Return to clinic in 3 weeks for repeat laboratory, further evaluation, and consideration of cycle 8. 2.  Anemia: Mild, monitor.  I spent a total of 30 minutes face-to-face with the patient of which greater than 50% of the visit was spent in counseling and coordination of care as detailed above.   Patient expressed understanding and was in agreement with this plan. He also understands that He can call clinic at any time with any questions, concerns, or complaints.   Cancer Staging No matching staging information was found for the patient.  Lloyd Huger, MD   10/21/2018 2:40 PM

## 2018-10-21 ENCOUNTER — Telehealth: Payer: Self-pay

## 2018-10-21 ENCOUNTER — Inpatient Hospital Stay: Payer: Medicare Other | Admitting: *Deleted

## 2018-10-21 ENCOUNTER — Inpatient Hospital Stay: Payer: Medicare Other | Attending: Oncology | Admitting: Oncology

## 2018-10-21 ENCOUNTER — Inpatient Hospital Stay: Payer: Medicare Other

## 2018-10-21 ENCOUNTER — Encounter: Payer: Self-pay | Admitting: Oncology

## 2018-10-21 ENCOUNTER — Ambulatory Visit: Payer: PRIVATE HEALTH INSURANCE | Admitting: Oncology

## 2018-10-21 ENCOUNTER — Other Ambulatory Visit: Payer: Self-pay

## 2018-10-21 VITALS — BP 101/63 | HR 67 | Temp 96.6°F | Ht 68.0 in | Wt 163.0 lb

## 2018-10-21 DIAGNOSIS — Z5112 Encounter for antineoplastic immunotherapy: Secondary | ICD-10-CM | POA: Diagnosis not present

## 2018-10-21 DIAGNOSIS — C642 Malignant neoplasm of left kidney, except renal pelvis: Secondary | ICD-10-CM | POA: Diagnosis not present

## 2018-10-21 DIAGNOSIS — R630 Anorexia: Secondary | ICD-10-CM | POA: Diagnosis not present

## 2018-10-21 DIAGNOSIS — Z87891 Personal history of nicotine dependence: Secondary | ICD-10-CM | POA: Diagnosis not present

## 2018-10-21 DIAGNOSIS — C689 Malignant neoplasm of urinary organ, unspecified: Secondary | ICD-10-CM

## 2018-10-21 DIAGNOSIS — D649 Anemia, unspecified: Secondary | ICD-10-CM

## 2018-10-21 DIAGNOSIS — Z79899 Other long term (current) drug therapy: Secondary | ICD-10-CM | POA: Diagnosis not present

## 2018-10-21 DIAGNOSIS — D696 Thrombocytopenia, unspecified: Secondary | ICD-10-CM

## 2018-10-21 DIAGNOSIS — Z95828 Presence of other vascular implants and grafts: Secondary | ICD-10-CM

## 2018-10-21 LAB — COMPREHENSIVE METABOLIC PANEL
ALT: 11 U/L (ref 0–44)
AST: 17 U/L (ref 15–41)
Albumin: 3.9 g/dL (ref 3.5–5.0)
Alkaline Phosphatase: 77 U/L (ref 38–126)
Anion gap: 8 (ref 5–15)
BUN: 17 mg/dL (ref 8–23)
CO2: 26 mmol/L (ref 22–32)
Calcium: 9.5 mg/dL (ref 8.9–10.3)
Chloride: 102 mmol/L (ref 98–111)
Creatinine, Ser: 0.83 mg/dL (ref 0.61–1.24)
GFR calc Af Amer: >60 mL/min (ref >60)
GFR calc non Af Amer: >60 mL/min (ref >60)
Glucose, Bld: 168 mg/dL — ABNORMAL HIGH (ref 70–99)
Potassium: 4 mmol/L (ref 3.5–5.1)
Sodium: 136 mmol/L (ref 135–145)
Total Bilirubin: 0.8 mg/dL (ref 0.3–1.2)
Total Protein: 7.3 g/dL (ref 6.5–8.1)

## 2018-10-21 LAB — CBC WITH DIFFERENTIAL/PLATELET
Abs Immature Granulocytes: 0.01 10*3/uL (ref 0.00–0.07)
Basophils Absolute: 0 10*3/uL (ref 0.0–0.1)
Basophils Relative: 1 %
Eosinophils Absolute: 0.4 10*3/uL (ref 0.0–0.5)
Eosinophils Relative: 6 %
HCT: 36.3 % — ABNORMAL LOW (ref 39.0–52.0)
Hemoglobin: 11.9 g/dL — ABNORMAL LOW (ref 13.0–17.0)
Immature Granulocytes: 0 %
Lymphocytes Relative: 23 %
Lymphs Abs: 1.4 10*3/uL (ref 0.7–4.0)
MCH: 29.2 pg (ref 26.0–34.0)
MCHC: 32.8 g/dL (ref 30.0–36.0)
MCV: 89.2 fL (ref 80.0–100.0)
Monocytes Absolute: 0.6 10*3/uL (ref 0.1–1.0)
Monocytes Relative: 10 %
Neutro Abs: 3.8 10*3/uL (ref 1.7–7.7)
Neutrophils Relative %: 60 %
Platelets: 193 10*3/uL (ref 150–400)
RBC: 4.07 MIL/uL — ABNORMAL LOW (ref 4.22–5.81)
RDW: 13.8 % (ref 11.5–15.5)
WBC: 6.3 10*3/uL (ref 4.0–10.5)
nRBC: 0 % (ref 0.0–0.2)

## 2018-10-21 LAB — TSH: TSH: 6.646 u[IU]/mL — ABNORMAL HIGH (ref 0.350–4.500)

## 2018-10-21 MED ORDER — SODIUM CHLORIDE 0.9 % IV SOLN
Freq: Once | INTRAVENOUS | Status: AC
  Administered 2018-10-21: 11:00:00 via INTRAVENOUS
  Filled 2018-10-21: qty 250

## 2018-10-21 MED ORDER — SODIUM CHLORIDE 0.9 % IV SOLN
1200.0000 mg | Freq: Once | INTRAVENOUS | Status: AC
  Administered 2018-10-21: 1200 mg via INTRAVENOUS
  Filled 2018-10-21: qty 20

## 2018-10-21 MED ORDER — SODIUM CHLORIDE 0.9% FLUSH
10.0000 mL | Freq: Once | INTRAVENOUS | Status: AC
  Administered 2018-10-21: 10 mL via INTRAVENOUS
  Filled 2018-10-21: qty 10

## 2018-10-21 MED ORDER — HEPARIN SOD (PORK) LOCK FLUSH 100 UNIT/ML IV SOLN
500.0000 [IU] | Freq: Once | INTRAVENOUS | Status: AC | PRN
  Administered 2018-10-21: 500 [IU]
  Filled 2018-10-21: qty 5

## 2018-10-21 NOTE — Progress Notes (Signed)
Patient stated that he had been doing well but his appetite is not so good, not tasting good. He stated that he's been trying to eat the most he could.

## 2018-10-21 NOTE — Telephone Encounter (Signed)
Telephone call to schedule TELEMED visit.  Wife [name] in agreement with TELEMED visit 10-24-18 at 11:00 AM. TELEMED invitation sent via ZOOM.  Wife reports patient had immunotherapy treatment today at the cancer cancer.

## 2018-10-24 ENCOUNTER — Other Ambulatory Visit: Payer: Medicare Other

## 2018-10-24 ENCOUNTER — Other Ambulatory Visit: Payer: Self-pay

## 2018-10-24 DIAGNOSIS — Z515 Encounter for palliative care: Secondary | ICD-10-CM

## 2018-10-24 NOTE — Progress Notes (Signed)
COMMUNITY PALLIATIVE CARE SW NOTE  PATIENT NAME: Patrick Mcgee DOB: August 27, 1945 MRN: [id number]  PRIMARY CARE PROVIDER: Jerrol Banana., MD  RESPONSIBLE PARTY:  Acct ID - Guarantor Home Phone Work Phone Relationship Acct Type  [id number] Patrick Mcgee, MONACO [phone]  Self P/F     [address], [place], [place] [address]     PLAN OF CARE and INTERVENTIONS:             1. GOALS OF CARE/ ADVANCE CARE PLANNING:  Patient's goal is to continue with immunotherapy. Patient remains FULL CODE. 2. SOCIAL/EMOTIONAL/SPIRITUAL ASSESSMENT/ INTERVENTIONS:  Virgie, RN and SW met with patient and wife, Patrick Mcgee via [place]. Patient was alert, oriented. Patient reports he is doing "good". Patient said he does feel tired, sleeping a lot during the day. Patient explained he is trying to exercise to maintain strength and stamina. Patient and Patrick Mcgee are still connecting with family via [place] and enjoy that.  3. PATIENT/CAREGIVER EDUCATION/ COPING:  Patient and Patrick Mcgee verbalize strong understanding of disease and treatment. RN and SW processed feelings regarding COVID-19 crisis. Patient shared that he misses church and fellowship with church members. Patient and Patrick Mcgee are coping well, use faith and humor to cope.  4. PERSONAL EMERGENCY PLAN:  Patient and family will call 9-1-1 for emergencies. Due to COVID-19 crisis, patient verbalized that he is remaining at home, as able and washing hands frequently. Patient is going to cancer center for appointments. 5. COMMUNITY RESOURCES COORDINATION/ HEALTH CARE NAVIGATION:  Patient and Patrick Mcgee manage patient's appointments. No needs at this time. 6. FINANCIAL/LEGAL CONCERNS/INTERVENTIONS:  None at this time.     SOCIAL HX:  Social History   Tobacco Use  . Smoking status: Former Smoker    Types: Cigars  . Smokeless tobacco: Former Systems developer    Types: Chew  . Tobacco comment: on occasion  Substance Use Topics  . Alcohol use: Not Currently    Comment: rarely    CODE  STATUS:   Code Status: Prior  ADVANCED DIRECTIVES: N MOST FORM COMPLETE:  No. HOSPICE EDUCATION PROVIDED: None this visit.  PPS: Patient is independent of ADLs.   Due to the COVID-19 crisis, this visit was done via Zoom from my office and it was initiated and consent by this patient and/or family. This was a scheduled visit.   I spent 45 minutes with patient/family, from 11:00-11:45a providing education, support and consultation.    Margaretmary Lombard, LCSW

## 2018-10-24 NOTE — Progress Notes (Signed)
PATIENT NAME: Patrick Mcgee DOB: 1945-09-24 MRN: [id number]  PRIMARY CARE PROVIDER: Jerrol Banana., MD  RESPONSIBLE PARTY:  Acct ID - Guarantor Home Phone Work Phone Relationship Acct Type  [id number] LEN, AZEEZ [phone]  Self P/F     [address], [place], [place] [address]    PLAN OF CARE and INTERVENTIONS:               1.  GOALS OF CARE/ ADVANCE CARE PLANNING:  Remain at home with wife.               2.  PATIENT/CAREGIVER EDUCATION:  Education on signs and symptoms of infection, fever, cough, chills, shortness of breath, weakness, good handwashing.                  3.  DISEASE STATUS: Patient is a 73 year old patient who resides at home with his wife [name].  Patient and wife meet with RN and SW on Zoom for TELEMED visit.Due to the COVID-19 crisis, this visit was done via telemedicine from my office and it was initiated and consent by this patient and or family. Patient reports he received immunotherapy treatment on Monday.  Patient reports he has been staying in other than going to the cancer center.  Patient states he feels well other than he does feel weak at times. Patient denies having any pain.  Wife reports patient is to receive treatment the first of May and patient is to have a CT scan in May.  Patient's blood sugar has been running 200 or below.  Patient reports his lab work has been good.  Patient denies having any cough or shortness of breath. Wife reports patient sneezes but feels like this is due to allergies.  Patient reports he does feel hungry however wife states if she prepares patient a plate patient will eat.  Patient current weight is 163 pounds.  Patient continues to sleep well.  Wife states patient continues to sleep late during the day and is up most of the night.  Patient and wife deny having any needs at the present time.  Patient and wife encouraged to call with questions or concerns.                HISTORY OF PRESENT ILLNESS:    CODE STATUS: Full  Code  ADVANCED DIRECTIVES: Y MOST FORM: No PPS: 60%   PHYSICAL EXAM:   VITALS: See Vital signs/ TELEMED visit/ Vital signs per patient from cancer center   LUNGS: Denies shortness of breath and cough CARDIAC: EXTREMITIES: none edema SKIN: Skin color, texture, turgor normal. No rashes or lesions or mobility and turgor normal  NEURO: Alert and oriented x 4       Nilda Simmer, RN

## 2018-10-30 ENCOUNTER — Encounter: Payer: Self-pay | Admitting: Oncology

## 2018-11-06 ENCOUNTER — Other Ambulatory Visit: Payer: Self-pay | Admitting: Family Medicine

## 2018-11-06 ENCOUNTER — Encounter: Payer: Self-pay | Admitting: Oncology

## 2018-11-08 ENCOUNTER — Encounter: Payer: Self-pay | Admitting: Oncology

## 2018-11-08 ENCOUNTER — Inpatient Hospital Stay: Payer: Medicare Other | Attending: Oncology | Admitting: Oncology

## 2018-11-08 ENCOUNTER — Other Ambulatory Visit: Payer: Self-pay

## 2018-11-08 DIAGNOSIS — R7989 Other specified abnormal findings of blood chemistry: Secondary | ICD-10-CM | POA: Diagnosis not present

## 2018-11-08 DIAGNOSIS — I25119 Atherosclerotic heart disease of native coronary artery with unspecified angina pectoris: Secondary | ICD-10-CM

## 2018-11-08 DIAGNOSIS — Z5112 Encounter for antineoplastic immunotherapy: Secondary | ICD-10-CM | POA: Diagnosis not present

## 2018-11-08 DIAGNOSIS — Z79899 Other long term (current) drug therapy: Secondary | ICD-10-CM | POA: Insufficient documentation

## 2018-11-08 DIAGNOSIS — C642 Malignant neoplasm of left kidney, except renal pelvis: Secondary | ICD-10-CM | POA: Insufficient documentation

## 2018-11-08 DIAGNOSIS — C689 Malignant neoplasm of urinary organ, unspecified: Secondary | ICD-10-CM | POA: Diagnosis not present

## 2018-11-10 ENCOUNTER — Other Ambulatory Visit: Payer: Self-pay

## 2018-11-11 ENCOUNTER — Inpatient Hospital Stay: Payer: Medicare Other

## 2018-11-11 ENCOUNTER — Other Ambulatory Visit: Payer: Self-pay

## 2018-11-11 ENCOUNTER — Inpatient Hospital Stay: Payer: Medicare Other | Admitting: *Deleted

## 2018-11-11 VITALS — BP 111/71 | HR 64 | Temp 97.0°F | Resp 18 | Wt 162.2 lb

## 2018-11-11 DIAGNOSIS — Z5112 Encounter for antineoplastic immunotherapy: Secondary | ICD-10-CM | POA: Diagnosis not present

## 2018-11-11 DIAGNOSIS — Z95828 Presence of other vascular implants and grafts: Secondary | ICD-10-CM

## 2018-11-11 DIAGNOSIS — D696 Thrombocytopenia, unspecified: Secondary | ICD-10-CM

## 2018-11-11 DIAGNOSIS — C689 Malignant neoplasm of urinary organ, unspecified: Secondary | ICD-10-CM

## 2018-11-11 DIAGNOSIS — C642 Malignant neoplasm of left kidney, except renal pelvis: Secondary | ICD-10-CM | POA: Diagnosis not present

## 2018-11-11 DIAGNOSIS — Z79899 Other long term (current) drug therapy: Secondary | ICD-10-CM | POA: Diagnosis not present

## 2018-11-11 LAB — CBC WITH DIFFERENTIAL/PLATELET
Abs Immature Granulocytes: 0.01 10*3/uL (ref 0.00–0.07)
Basophils Absolute: 0 10*3/uL (ref 0.0–0.1)
Basophils Relative: 0 %
Eosinophils Absolute: 0.5 10*3/uL (ref 0.0–0.5)
Eosinophils Relative: 6 %
HCT: 35.4 % — ABNORMAL LOW (ref 39.0–52.0)
Hemoglobin: 11.8 g/dL — ABNORMAL LOW (ref 13.0–17.0)
Immature Granulocytes: 0 %
Lymphocytes Relative: 13 %
Lymphs Abs: 0.9 10*3/uL (ref 0.7–4.0)
MCH: 29.3 pg (ref 26.0–34.0)
MCHC: 33.3 g/dL (ref 30.0–36.0)
MCV: 87.8 fL (ref 80.0–100.0)
Monocytes Absolute: 0.7 10*3/uL (ref 0.1–1.0)
Monocytes Relative: 9 %
Neutro Abs: 5.3 10*3/uL (ref 1.7–7.7)
Neutrophils Relative %: 72 %
Platelets: 195 10*3/uL (ref 150–400)
RBC: 4.03 MIL/uL — ABNORMAL LOW (ref 4.22–5.81)
RDW: 13.9 % (ref 11.5–15.5)
WBC: 7.4 10*3/uL (ref 4.0–10.5)
nRBC: 0 % (ref 0.0–0.2)

## 2018-11-11 LAB — COMPREHENSIVE METABOLIC PANEL
ALT: 13 U/L (ref 0–44)
AST: 20 U/L (ref 15–41)
Albumin: 3.8 g/dL (ref 3.5–5.0)
Alkaline Phosphatase: 90 U/L (ref 38–126)
Anion gap: 10 (ref 5–15)
BUN: 20 mg/dL (ref 8–23)
CO2: 26 mmol/L (ref 22–32)
Calcium: 9.5 mg/dL (ref 8.9–10.3)
Chloride: 100 mmol/L (ref 98–111)
Creatinine, Ser: 0.79 mg/dL (ref 0.61–1.24)
GFR calc Af Amer: >60 mL/min (ref >60)
GFR calc non Af Amer: >60 mL/min (ref >60)
Glucose, Bld: 227 mg/dL — ABNORMAL HIGH (ref 70–99)
Potassium: 4.3 mmol/L (ref 3.5–5.1)
Sodium: 136 mmol/L (ref 135–145)
Total Bilirubin: 0.7 mg/dL (ref 0.3–1.2)
Total Protein: 7.2 g/dL (ref 6.5–8.1)

## 2018-11-11 MED ORDER — HEPARIN SOD (PORK) LOCK FLUSH 100 UNIT/ML IV SOLN
500.0000 [IU] | Freq: Once | INTRAVENOUS | Status: AC | PRN
  Administered 2018-11-11: 500 [IU]

## 2018-11-11 MED ORDER — SODIUM CHLORIDE 0.9 % IV SOLN
1200.0000 mg | Freq: Once | INTRAVENOUS | Status: AC
  Administered 2018-11-11: 1200 mg via INTRAVENOUS
  Filled 2018-11-11: qty 20

## 2018-11-11 MED ORDER — HEPARIN SOD (PORK) LOCK FLUSH 100 UNIT/ML IV SOLN
INTRAVENOUS | Status: AC
  Filled 2018-11-11: qty 5

## 2018-11-11 MED ORDER — SODIUM CHLORIDE 0.9 % IV SOLN
Freq: Once | INTRAVENOUS | Status: AC
  Administered 2018-11-11: 15:00:00 via INTRAVENOUS
  Filled 2018-11-11: qty 250

## 2018-11-11 MED ORDER — SODIUM CHLORIDE 0.9% FLUSH
10.0000 mL | Freq: Once | INTRAVENOUS | Status: AC
  Administered 2018-11-11: 10 mL via INTRAVENOUS
  Filled 2018-11-11: qty 10

## 2018-11-13 NOTE — Progress Notes (Signed)
I connected with Patrick Mcgee on 11/13/18 at 10:30 AM EDT by video enabled telemedicine visit and verified that I am speaking with the correct person using two identifiers.   I discussed the limitations, risks, security and privacy concerns of performing an evaluation and management service by telemedicine and the availability of in-person appointments. I also discussed with the patient that there may be a patient responsible charge related to this service. The patient expressed understanding and agreed to proceed.  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 [name] so far for his metastatic urothelial carcinoma. This was originally diagnosed in May 2019. He was noted to have a filling defect in the lower pole collecting system of the left kidney along with para-aortic and retroperitoneal adenopathy concerning for metastatic disease. He underwent left ureteroscopy and renal pelvis biopsy which revealed small fragments of high-grade urothelial carcinoma with small focus of invasion. He was started on carboplatin and gemcitabine chemotherapy in June 2019 and was continued on 05/27/2018 for 8 cycles. He tolerated chemotherapy well except for chemo-induced anemia for which she has been getting Procrit every 2 weeks. He did have response to his disease based on scans in August 2019. However repeat scan on 05/31/2018 showed increase in the size of the primary tumor from 1.2 to 1.9 cm and increase in left para-aortic adenopathy from 0.9 to 1.3 cm. Periportal adenopathy was stable at 1.4 cm. Second line immunotherapy was recommended. His initial biopsy specimen did  not have enough sample to undergo FGFR mutation testing. He also has mild interstitial lung disease for which he sees pulmonary but he is not on home oxygen. He has B12 deficiency for which he is on oral B12. Also has diabetes and coronary artery disease.Tecentriq started on 06/17/2018  Interval history he has chronic mild fatigue but denies any new complaints at this time.  Has not had any episodes of hypotension or falls.   Review of Systems  Constitutional: Positive for malaise/fatigue. Negative for chills, fever and weight loss.  HENT: Negative for congestion, ear discharge and nosebleeds.   Eyes: Negative for blurred vision.  Respiratory: Negative for cough, hemoptysis, sputum production, shortness of breath and wheezing.   Cardiovascular: Negative for chest pain, palpitations, orthopnea and claudication.  Gastrointestinal: Negative for abdominal pain, blood in stool, constipation, diarrhea, heartburn, melena, nausea and vomiting.  Genitourinary: Negative for dysuria, flank pain, frequency, hematuria and urgency.  Musculoskeletal: Negative for back pain, joint pain and myalgias.  Skin: Negative for rash.  Neurological: Negative for dizziness, tingling, focal weakness, seizures, weakness and headaches.  Endo/Heme/Allergies: Does not bruise/bleed easily.  Psychiatric/Behavioral: Negative for depression and suicidal ideas. The patient does not have insomnia.     Allergies  Allergen Reactions  . Sulfa Antibiotics Other (See Comments)    Joint pain  . Ace Inhibitors Cough  . Invokana [Canagliflozin] Other (See Comments)    Leg pain  . Penicillins Rash    Has patient had a PCN reaction causing immediate rash, facial/tongue/throat swelling, SOB or lightheadedness with hypotension: Yes Has patient had a PCN reaction causing severe rash involving mucus membranes or skin necrosis: No Has patient had a PCN reaction that required hospitalization:  No Has patient had a PCN reaction  occurring within the last 10 years: No If all of the above answers are "NO", then may proceed with Cephalosporin use.    Past Medical History:  Diagnosis Date  . Acid reflux   . Anxiety   . Depression   . Diabetes mellitus without complication ([place])   . Hyperlipidemia   . Hypertension   . Myocardial infarction ([place]) 1995  . Sleep apnea   . Urothelial cancer ([place]) 11/2017   Left Urothelial mass, chemo tx's    Past Surgical History:  Procedure Laterality Date  . CATARACT EXTRACTION Left   . COLONOSCOPY  2010   [facility]  . COLONOSCOPY WITH PROPOFOL N/A 10/04/2016   Procedure: COLONOSCOPY WITH PROPOFOL;  Surgeon: [name], MD;  Location: [facility] ENDOSCOPY;  Service: Endoscopy;  Laterality: N/A;  . [place], 2000, 2001, 2014  . CYSTOSCOPY W/ RETROGRADES Bilateral 12/10/2017   Procedure: CYSTOSCOPY WITH RETROGRADE PYELOGRAM;  Surgeon: [name], MD;  Location: [facility] ORS;  Service: Urology;  Laterality: Bilateral;  . CYSTOSCOPY WITH STENT PLACEMENT Left 12/10/2017   Procedure: CYSTOSCOPY WITH STENT PLACEMENT;  Surgeon: [name], MD;  Location: [facility] ORS;  Service: Urology;  Laterality: Left;  . INGUINAL HERNIA REPAIR Right 08/09/2015   Procedure: HERNIA REPAIR INGUINAL ADULT;  Surgeon: [name], MD;  Location: [facility] ORS;  Service: General;  Laterality: Right;  . NASAL SINUS SURGERY    . nuclear stress test    . PORTA CATH INSERTION N/A 12/19/2017   Procedure: PORTA CATH INSERTION;  Surgeon: [name], MD;  Location: [facility] LAB;  Service: Cardiovascular;  Laterality: N/A;  . URETERAL BIOPSY Left 12/10/2017   Procedure: URETERAL & renal PELVIS BIOPSY;  Surgeon: [name], MD;  Location: [facility] ORS;  Service: Urology;  Laterality: Left;  . URETEROSCOPY Left 12/10/2017   Procedure: URETEROSCOPY;  Surgeon: [name], MD;  Location: [facility] ORS;  Service: Urology;  Laterality: Left;    Social History    Socioeconomic History  . Marital status: Married    Spouse name: Not on file  . Number of children: 1  . Years of education: Not on file  . Highest education level: Associate degree: occupational, Hotel manager, or vocational program  Occupational History  . Occupation: retired  Scientific laboratory technician  . Financial resource strain: Not hard at all  . Food insecurity:    Worry: Never true    Inability: Never true  . Transportation needs:    Medical: No    Non-medical: No  Tobacco Use  . Smoking status: Former Smoker    Types: Cigars    Last attempt to quit: 10/09/1978    Years since quitting: 40.1  . Smokeless tobacco: Former Systems developer    Types: Chew  . Tobacco comment: on occasion  Substance and Sexual Activity  . Alcohol use: Not Currently  . Drug use: No  . Sexual activity: Not Currently  Lifestyle  . Physical activity:    Days per week: Not on file    Minutes per session: Not on file  . Stress: Only a little  Relationships  . Social connections:    Talks on phone: Not on file    Gets together: Not on file    Attends religious service: Not on file    Active member of club or organization: Not on file    Attends meetings of clubs or organizations: Not on file    Relationship  status: Not on file  . Intimate partner violence:    Fear of current or ex partner: Not on file    Emotionally abused: Not on file    Physically abused: Not on file    Forced sexual activity: Not on file  Other Topics Concern  . Not on file  Social History Narrative  . Not on file    Family History  Problem Relation Age of Onset  . Heart disease Mother   . Cancer Father        Lung and colon cancer  . Heart disease Father   . Emphysema Maternal Grandfather   . Tuberculosis Maternal Grandmother      Current Outpatient Medications:  .  atorvastatin (LIPITOR) 40 MG tablet, Take 1 tablet (40 mg total) by mouth at bedtime., Disp: 90 tablet, Rfl: 3 .  carvedilol (COREG) 12.5 MG tablet, TAKE 1 TABLET BY  MOUTH TWICE DAILY, Disp: 180 tablet, Rfl: 3 .  clobetasol cream (TEMOVATE) 7.56 %, Apply 1 application topically as needed (for skin rash)., Disp: 30 g, Rfl: 1 .  CONTOUR NEXT TEST test strip, USE 1 STRIP TO CHECK GLUCOSE 4 TIMES DAILY, Disp: 350 each, Rfl: 0 .  empagliflozin (JARDIANCE) 10 MG TABS tablet, Take 10 mg by mouth daily., Disp: 30 tablet, Rfl: 11 .  fluticasone (FLONASE) 50 MCG/ACT nasal spray, Use 2 spray(s) in each nostril once daily, Disp: 48 g, Rfl: 3 .  hydrOXYzine (ATARAX/VISTARIL) 25 MG tablet, Take 1 tablet (25 mg total) by mouth every 8 (eight) hours as needed for itching., Disp: 30 tablet, Rfl: 3 .  Ivermectin (SOOLANTRA) 1 % CREA, Apply topically at bedtime. To face, Disp: , Rfl:  .  lidocaine-prilocaine (EMLA) cream, Apply 1 application topically as needed., Disp: 30 g, Rfl: 3 .  losartan (COZAAR) 100 MG tablet, TAKE 1 TABLET BY MOUTH ONCE DAILY, Disp: 90 tablet, Rfl: 3 .  metFORMIN (GLUCOPHAGE) 1000 MG tablet, TAKE 1 TABLET BY MOUTH TWICE DAILY WITH MEALS, Disp: 180 tablet, Rfl: 3 .  nystatin cream (MYCOSTATIN), Apply topically 2 (two) times daily. (Patient taking differently: Apply topically 2 (two) times daily as needed. ), Disp: 15 g, Rfl: 1 .  sertraline (ZOLOFT) 100 MG tablet, Take 1 tablet (100 mg total) by mouth daily., Disp: 30 tablet, Rfl: 5 .  acetaminophen (TYLENOL) 500 MG tablet, Take 500 mg by mouth every 6 (six) hours as needed for moderate pain. When taking oxycodone 5 mg tablets, Disp: , Rfl:  .  feeding supplement, ENSURE ENLIVE, (ENSURE ENLIVE) LIQD, Take 237 mLs by mouth 3 (three) times daily between meals. (Patient not taking: Reported on 11/08/2018), Disp: 90 Bottle, Rfl: 0 .  ondansetron (ZOFRAN) 8 MG tablet, Take 1 tablet (8 mg total) by mouth every 8 (eight) hours as needed for nausea or vomiting (if needed beginning 3 days after chemotherapy). (Patient not taking: Reported on 11/08/2018), Disp: 20 tablet, Rfl: 0 No current facility-administered medications  for this visit.   Facility-Administered Medications Ordered in Other Visits:  .  Influenza vac split quadrivalent PF (FLUZONE HIGH-DOSE) injection 0.5 mL, 0.5 mL, Intramuscular, Once, Karen Kitchens, NP  No results found.  No images are attached to the encounter.   CMP Latest Ref Rng & Units 11/11/2018  Glucose 70 - 99 mg/dL 227(H)  BUN 8 - 23 mg/dL 20  Creatinine 0.61 - 1.24 mg/dL 0.79  Sodium 135 - 145 mmol/L 136  Potassium 3.5 - 5.1 mmol/L 4.3  Chloride 98 - 111 mmol/L  100  CO2 22 - 32 mmol/L 26  Calcium 8.9 - 10.3 mg/dL 9.5  Total Protein 6.5 - 8.1 g/dL 7.2  Total Bilirubin 0.3 - 1.2 mg/dL 0.7  Alkaline Phos 38 - 126 U/L 90  AST 15 - 41 U/L 20  ALT 0 - 44 U/L 13   CBC Latest Ref Rng & Units 11/11/2018  WBC 4.0 - 10.5 K/uL 7.4  Hemoglobin 13.0 - 17.0 g/dL 11.8(L)  Hematocrit 39.0 - 52.0 % 35.4(L)  Platelets 150 - 400 K/uL 195     Observation/objective: Patient appears in no acute distress over video visit today.  Breathing is nonlabored  Assessment and plan:Patient is a 73 y.o. male with stage IV metastatic left urothelial carcinoma TX N2 M0. He is here for on treatment assessment prior to cycle 8 of Tecentriq  Symptomatically patient is doing well and will proceed with labs and cycle 8 of Tecentriq on 11/11/2018.  I will see him back in 3 weeks time in my office with CBC with differential, CMP for cycle 9.  I will add a free T3 and T4 when he comes for cycle 9.  Patient's TSH was elevated at 6.6 during his recent visit.  His prior TSH was normal.  Continue to monitor every 6 weeks.  Repeat CT chest abdomen pelvis 2 weeks from now.  Follow-up instructions: Labs and treatment on 11/11/2018. Labs and see MD and receive treatment 3 weeks from now. CT chest and abdomen and pelvis 2 weeks from now.  I discussed the assessment and treatment plan with the patient. The patient was provided an opportunity to ask questions and all were answered. The patient agreed with the plan and  demonstrated an understanding of the instructions.   The patient was advised to call back or seek an in-person evaluation if the symptoms worsen or if the condition fails to improve as anticipated.   Visit Diagnosis: No diagnosis found.  Dr. Randa Evens, MD, MPH [facility] [phone] 11/13/2018 8:49 AM

## 2018-11-14 ENCOUNTER — Telehealth: Payer: Self-pay

## 2018-11-14 NOTE — Telephone Encounter (Signed)
Telephone call to patient's home to schedule palliative visit.  Wife [name] requests visit week of 12/02/18.  Wife states patient has scan within the next few weeks and would like for patient to meet with MD for results prior to palliative care visit.  Wife in agreement with RN calling 11/29/18 to schedule visit time.

## 2018-11-21 ENCOUNTER — Encounter: Payer: Self-pay | Admitting: Oncology

## 2018-11-25 ENCOUNTER — Other Ambulatory Visit: Payer: Self-pay

## 2018-11-25 ENCOUNTER — Ambulatory Visit
Admission: RE | Admit: 2018-11-25 | Discharge: 2018-11-25 | Disposition: A | Discharge status: Home / Self Care | Payer: Medicare Other | Source: Ambulatory Visit | Attending: Oncology | Admitting: Oncology

## 2018-11-25 DIAGNOSIS — Z5112 Encounter for antineoplastic immunotherapy: Secondary | ICD-10-CM | POA: Insufficient documentation

## 2018-11-25 DIAGNOSIS — C689 Malignant neoplasm of urinary organ, unspecified: Secondary | ICD-10-CM | POA: Diagnosis not present

## 2018-11-25 DIAGNOSIS — C642 Malignant neoplasm of left kidney, except renal pelvis: Secondary | ICD-10-CM | POA: Diagnosis not present

## 2018-11-25 DIAGNOSIS — C772 Secondary and unspecified malignant neoplasm of intra-abdominal lymph nodes: Secondary | ICD-10-CM | POA: Diagnosis not present

## 2018-11-25 MED ORDER — IOPAMIDOL (ISOVUE-300) INJECTION 61%
100.0000 mL | Freq: Once | INTRAVENOUS | Status: AC | PRN
  Administered 2018-11-25: 14:00:00 100 mL via INTRAVENOUS

## 2018-11-26 ENCOUNTER — Other Ambulatory Visit: Payer: Self-pay

## 2018-11-26 ENCOUNTER — Other Ambulatory Visit: Payer: Self-pay | Admitting: Oncology

## 2018-11-26 MED ORDER — GLUCOSE BLOOD VI STRP: ORAL_STRIP | 11 refills | Status: DC

## 2018-11-26 NOTE — Telephone Encounter (Signed)
Pt requesting refills on test strips. This was sent into the pharmacy.

## 2018-11-26 NOTE — Progress Notes (Signed)
Multi t

## 2018-11-28 ENCOUNTER — Other Ambulatory Visit: Payer: Self-pay

## 2018-11-28 ENCOUNTER — Telehealth (INDEPENDENT_AMBULATORY_CARE_PROVIDER_SITE_OTHER): Payer: Medicare Other | Admitting: Urology

## 2018-11-28 ENCOUNTER — Other Ambulatory Visit: Payer: PRIVATE HEALTH INSURANCE

## 2018-11-28 DIAGNOSIS — C642 Malignant neoplasm of left kidney, except renal pelvis: Secondary | ICD-10-CM

## 2018-11-28 NOTE — Progress Notes (Signed)
Virtual Visit via Telephone Note  I connected with Patrick Mcgee on 11/28/18 at  3:00 PM EDT by telephone and verified that I am speaking with the correct person using two identifiers.  His wife is also present today for our conversation.  Location: Patient: home Provider: home   I discussed the limitations, risks, security and privacy concerns of performing an evaluation and management service by telephone and the availability of in person appointments. I also discussed with the patient that there may be a patient responsible charge related to this service. The patient expressed understanding and agreed to proceed.   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  Patient was discussed today at multiple disciplinary tumor board with surgical pathology, radiology, and medical oncology (Dr. Janese Banks all present.  CT scan of the abdomen pelvis performed on 11/26/2018 shows progression of left.  Aortic lymphadenopathy now measuring 1.9 cm, previously 11 mm as of 05/2018.  Primary tumor in the left collecting system is essentially unchanged, 3 cm.  There is no additional obvious metastatic disease.  After his initial diagnosis on 12-2018, he was treated with carboplatin and gemcitabine and later transitioned to to eccentric on 12/19 of which he is going to receive his eighth cycle next week.  In discussion at tumor board today, the initial tumor specimen was not able to be sent for F GFR mutation testing secondary to insufficient amount of tissue.  And considering alternative treatment options, this would be helpful to guide therapy.  Overall, Patrick Mcgee is more frail than last year but has a reasonably good functional status.  He is chronic mild fatigue.  He does have a personal history of coronary artery disease status  post stents followed by cardiologist at [facility] on 81 mg of aspirin.  He was able to stop this for previous procedures without difficulty.  He does have interstitial lung disease but not on home O2.  He has been self isolating as much as possible during COVID-19 pandemic.  Multiple CT scans, labs, past medical history/surgical history and medications were reviewed today personally.   Observations/Objective: Patrick Mcgee and his wife were pleasant, interactive and asked really good questions today by telephone.  Assessment and Plan:  1. 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 Janese Banks and others from multidisciplinary tumor board.  Ideally, from progression of disease, the patient could be started on second line immunotherapy however this would be most helpful knowing whether or not he has F GFR mutation which requires more tissue.  Reviewed various options for obtaining this tissue including CT-guided biopsy of the lymph node versus ureteroscopically.  I discussed these options with the patient extensively today as well as reviewed CT scan findings.  He is most interested in pursuing ureteroscopy as he is tolerated this well in the past.  He understands that there is a chance we may not be able to obtain enough tissue.  We discussed the risk of the procedure including risk of bleeding, infection, damage surrounding structures, need for ureteral stent amongst others.  All questions were answered.  We will obtain cardiac clearance and have him hold his aspirin for 5 to 7 days ideally prior to the procedure although it can be done  on baby aspirin if needed.  He understands that in light of COVID-19, he will require preoperative COVID testing and self quarantine after the test.  He is agreeable with all of this.  All questions were answered today.  We will  schedule the patient for June 1 for the procedure.   Follow Up Instructions:    I discussed the assessment and treatment plan with the patient. The patient was provided an opportunity to ask questions and all were answered. The patient agreed with the plan and demonstrated an understanding of the instructions.   The patient was advised to call back or seek an in-person evaluation if the symptoms worsen or if the condition fails to improve as anticipated.  I provided 25 minutes of non-face-to-face time during this encounter.   [name], MD

## 2018-11-28 NOTE — Progress Notes (Signed)
Tumor Board Documentation  Patrick Mcgee was presented by Dr Janese Banks at our Tumor Board on 11/28/2018, which included representatives from medical oncology, radiation oncology, surgical oncology, surgical, radiology, pathology, navigation, internal medicine, research, palliative care, pulmonology.  Patrick Mcgee currently presents as a current patient, for progression with history of the following treatments: immunotherapy, adjuvant chemotherapy.  Additionally, we reviewed previous medical and familial history, history of present illness, and recent lab results along with all available histopathologic and imaging studies. The tumor board considered available treatment options and made the following recommendations: Biopsy Refr to Dr Ruthe Mannan for biopsy for tissue for FGFR testing  The following procedures/referrals were also placed: No orders of the defined types were placed in this encounter.   Clinical Trial Status: not discussed   Staging used: AJCC Stage Group  AJCC Staging: T: Tx N: N2 M: M0 Group: Stage 4 Urothelial Carcinoma   National site-specific guidelines NCCN were discussed with respect to the case.  Tumor board is a meeting of clinicians from various specialty areas who evaluate and discuss patients for whom a multidisciplinary approach is being considered. Final determinations in the plan of care are those of the provider(s). The responsibility for follow up of recommendations given during tumor board is that of the provider.   Today's extended care, comprehensive team conference, Patrick Mcgee was not present for the discussion and was not examined.   Multidisciplinary Tumor Board is a multidisciplinary case peer review process.  Decisions discussed in the Multidisciplinary Tumor Board reflect the opinions of the specialists present at the conference without having examined the patient.  Ultimately, treatment and diagnostic decisions rest with the primary provider(s) and the patient.

## 2018-11-28 NOTE — H&P (View-Only) (Signed)
Virtual Visit via Telephone Note  I connected with Patrick Mcgee on 11/28/18 at  3:00 PM EDT by telephone and verified that I am speaking with the correct person using two identifiers.  His wife is also present today for our conversation.  Location: Patient: home Provider: home   I discussed the limitations, risks, security and privacy concerns of performing an evaluation and management service by telephone and the availability of in person appointments. I also discussed with the patient that there may be a patient responsible charge related to this service. The patient expressed understanding and agreed to proceed.   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  Patient was discussed today at multiple disciplinary tumor board with surgical pathology, radiology, and medical oncology (Dr. Janese Banks all present.  CT scan of the abdomen pelvis performed on 11/26/2018 shows progression of left.  Aortic lymphadenopathy now measuring 1.9 cm, previously 11 mm as of 05/2018.  Primary tumor in the left collecting system is essentially unchanged, 3 cm.  There is no additional obvious metastatic disease.  After his initial diagnosis on 12-2018, he was treated with carboplatin and gemcitabine and later transitioned to to eccentric on 12/19 of which he is going to receive his eighth cycle next week.  In discussion at tumor board today, the initial tumor specimen was not able to be sent for F GFR mutation testing secondary to insufficient amount of tissue.  And considering alternative treatment options, this would be helpful to guide therapy.  Overall, Mr. Galbraith is more frail than last year but has a reasonably good functional status.  He is chronic mild fatigue.  He does have a personal history of coronary artery disease status  post stents followed by cardiologist at [facility] on 81 mg of aspirin.  He was able to stop this for previous procedures without difficulty.  He does have interstitial lung disease but not on home O2.  He has been self isolating as much as possible during COVID-19 pandemic.  Multiple CT scans, labs, past medical history/surgical history and medications were reviewed today personally.   Observations/Objective: Conlee and his wife were pleasant, interactive and asked really good questions today by telephone.  Assessment and Plan:  1. 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 Janese Banks and others from multidisciplinary tumor board.  Ideally, from progression of disease, the patient could be started on second line immunotherapy however this would be most helpful knowing whether or not he has F GFR mutation which requires more tissue.  Reviewed various options for obtaining this tissue including CT-guided biopsy of the lymph node versus ureteroscopically.  I discussed these options with the patient extensively today as well as reviewed CT scan findings.  He is most interested in pursuing ureteroscopy as he is tolerated this well in the past.  He understands that there is a chance we may not be able to obtain enough tissue.  We discussed the risk of the procedure including risk of bleeding, infection, damage surrounding structures, need for ureteral stent amongst others.  All questions were answered.  We will obtain cardiac clearance and have him hold his aspirin for 5 to 7 days ideally prior to the procedure although it can be done  on baby aspirin if needed.  He understands that in light of COVID-19, he will require preoperative COVID testing and self quarantine after the test.  He is agreeable with all of this.  All questions were answered today.  We will  schedule the patient for June 1 for the procedure.   Follow Up Instructions:    I discussed the assessment and treatment plan with the patient. The patient was provided an opportunity to ask questions and all were answered. The patient agreed with the plan and demonstrated an understanding of the instructions.   The patient was advised to call back or seek an in-person evaluation if the symptoms worsen or if the condition fails to improve as anticipated.  I provided 25 minutes of non-face-to-face time during this encounter.   [name], MD

## 2018-11-29 ENCOUNTER — Encounter: Payer: Self-pay | Admitting: Oncology

## 2018-11-29 ENCOUNTER — Other Ambulatory Visit: Payer: Self-pay

## 2018-11-29 ENCOUNTER — Inpatient Hospital Stay (HOSPITAL_BASED_OUTPATIENT_CLINIC_OR_DEPARTMENT_OTHER): Payer: Medicare Other | Admitting: Oncology

## 2018-11-29 DIAGNOSIS — Z7189 Other specified counseling: Secondary | ICD-10-CM

## 2018-11-29 DIAGNOSIS — Z7982 Long term (current) use of aspirin: Secondary | ICD-10-CM | POA: Diagnosis not present

## 2018-11-29 DIAGNOSIS — C689 Malignant neoplasm of urinary organ, unspecified: Secondary | ICD-10-CM

## 2018-11-29 DIAGNOSIS — I25119 Atherosclerotic heart disease of native coronary artery with unspecified angina pectoris: Secondary | ICD-10-CM | POA: Diagnosis not present

## 2018-11-29 NOTE — Progress Notes (Signed)
Pt will speak to Janese Banks about ct scan results

## 2018-12-01 NOTE — Progress Notes (Signed)
I connected with Patrick Mcgee on 12/01/18 at 10:15 AM EDT by video enabled telemedicine visit and verified that I am speaking with the correct person using two identifiers.   I discussed the limitations, risks, security and privacy concerns of performing an evaluation and management service by telemedicine and the availability of in-person appointments. I also discussed with the patient that there may be a patient responsible charge related to this service. The patient expressed understanding and agreed to proceed.  Other persons participating in the visit and their role in the encounter:  none  Patient's location:  home Provider's location:  home  Chief Complaint: Discuss CT scan results and further management  History of present illness: Patient is a 73 year old male who sees Dr. [name] so far for his metastatic urothelial carcinoma. This was originally diagnosed in May 2019. He was noted to have a filling defect in the lower pole collecting system of the left kidney along with para-aortic and retroperitoneal adenopathy concerning for metastatic disease. He underwent left ureteroscopy and renal pelvis biopsy which revealed small fragments of high-grade urothelial carcinoma with small focus of invasion. He was started on carboplatin and gemcitabine chemotherapy in June 2019 and was continued on 05/27/2018 for 8 cycles. He tolerated chemotherapy well except for chemo-induced anemia for which she has been getting Procrit every 2 weeks. He did have response to his disease based on scans in August 2019. However repeat scan on 05/31/2018 showed increase in the size of the primary tumor from 1.2 to 1.9 cm and increase in left para-aortic adenopathy from 0.9 to 1.3 cm. Periportal adenopathy was stable at 1.4 cm. Second line immunotherapy was recommended. His initial biopsy specimen did not have enough sample to undergo FGFR mutation testing. He also has mild interstitial lung disease for which he  sees pulmonary but he is not on home oxygen. He has B12 deficiency for which he is on oral B12. Also has diabetes and coronary artery disease.Tecentriq started on 06/17/2018  Interval history: Patient complains of fatigue denies other complaints at this time.  Denies any pain.  No movements are regular.  He has not had any falls   Review of Systems  Constitutional: Positive for malaise/fatigue. Negative for chills, fever and weight loss.  HENT: Negative for congestion, ear discharge and nosebleeds.   Eyes: Negative for blurred vision.  Respiratory: Negative for cough, hemoptysis, sputum production, shortness of breath and wheezing.   Cardiovascular: Negative for chest pain, palpitations, orthopnea and claudication.  Gastrointestinal: Negative for abdominal pain, blood in stool, constipation, diarrhea, heartburn, melena, nausea and vomiting.  Genitourinary: Negative for dysuria, flank pain, frequency, hematuria and urgency.  Musculoskeletal: Negative for back pain, joint pain and myalgias.  Skin: Negative for rash.  Neurological: Negative for dizziness, tingling, focal weakness, seizures, weakness and headaches.  Endo/Heme/Allergies: Does not bruise/bleed easily.  Psychiatric/Behavioral: Negative for depression and suicidal ideas. The patient does not have insomnia.     Allergies  Allergen Reactions  . Sulfa Antibiotics Other (See Comments)    Joint pain  . Ace Inhibitors Cough  . Invokana [Canagliflozin] Other (See Comments)    Leg pain  . Penicillins Rash    Has patient had a PCN reaction causing immediate rash, facial/tongue/throat swelling, SOB or lightheadedness with hypotension: Yes Has patient had a PCN reaction causing severe rash involving mucus membranes or skin necrosis: No Has patient had a PCN reaction that required hospitalization: No Has patient had a PCN reaction occurring within the last 10 years: No If  all of the above answers are "NO", then may proceed with  Cephalosporin use.    Past Medical History:  Diagnosis Date  . Acid reflux   . Anxiety   . Depression   . Diabetes mellitus without complication ([place])   . Hyperlipidemia   . Hypertension   . Myocardial infarction ([place]) 1995  . Sleep apnea   . Urothelial cancer ([place]) 11/2017   Left Urothelial mass, chemo tx's    Past Surgical History:  Procedure Laterality Date  . CATARACT EXTRACTION Left   . COLONOSCOPY  2010   [facility]  . COLONOSCOPY WITH PROPOFOL N/A 10/04/2016   Procedure: COLONOSCOPY WITH PROPOFOL;  Surgeon: [name], MD;  Location: [facility] ENDOSCOPY;  Service: Endoscopy;  Laterality: N/A;  . [place], 2000, 2001, 2014  . CYSTOSCOPY W/ RETROGRADES Bilateral 12/10/2017   Procedure: CYSTOSCOPY WITH RETROGRADE PYELOGRAM;  Surgeon: [name], MD;  Location: [facility] ORS;  Service: Urology;  Laterality: Bilateral;  . CYSTOSCOPY WITH STENT PLACEMENT Left 12/10/2017   Procedure: CYSTOSCOPY WITH STENT PLACEMENT;  Surgeon: [name], MD;  Location: [facility] ORS;  Service: Urology;  Laterality: Left;  . INGUINAL HERNIA REPAIR Right 08/09/2015   Procedure: HERNIA REPAIR INGUINAL ADULT;  Surgeon: [name], MD;  Location: [facility] ORS;  Service: General;  Laterality: Right;  . NASAL SINUS SURGERY    . nuclear stress test    . PORTA CATH INSERTION N/A 12/19/2017   Procedure: PORTA CATH INSERTION;  Surgeon: [name], MD;  Location: [place] [facility] LAB;  Service: Cardiovascular;  Laterality: N/A;  . URETERAL BIOPSY Left 12/10/2017   Procedure: URETERAL & renal PELVIS BIOPSY;  Surgeon: [name], MD;  Location: [facility] ORS;  Service: Urology;  Laterality: Left;  . URETEROSCOPY Left 12/10/2017   Procedure: URETEROSCOPY;  Surgeon: [name], MD;  Location: [facility] ORS;  Service: Urology;  Laterality: Left;    Social History   Socioeconomic History  . Marital status: Married    Spouse name: Not on file  . Number of children: 1  .  Years of education: Not on file  . Highest education level: Associate degree: occupational, Hotel manager, or vocational program  Occupational History  . Occupation: retired  Scientific laboratory technician  . Financial resource strain: Not hard at all  . Food insecurity:    Worry: Never true    Inability: Never true  . Transportation needs:    Medical: No    Non-medical: No  Tobacco Use  . Smoking status: Former Smoker    Types: Cigars    Last attempt to quit: 10/09/1978    Years since quitting: 40.1  . Smokeless tobacco: Former Systems developer    Types: Chew  . Tobacco comment: on occasion  Substance and Sexual Activity  . Alcohol use: Not Currently  . Drug use: No  . Sexual activity: Not Currently  Lifestyle  . Physical activity:    Days per week: Not on file    Minutes per session: Not on file  . Stress: Only a little  Relationships  . Social connections:    Talks on phone: Not on file    Gets together: Not on file    Attends religious service: Not on file    Active member of club or organization: Not on file    Attends meetings of clubs or organizations: Not on file    Relationship status: Not on file  . Intimate partner violence:    Fear of current  or ex partner: Not on file    Emotionally abused: Not on file    Physically abused: Not on file    Forced sexual activity: Not on file  Other Topics Concern  . Not on file  Social History Narrative  . Not on file    Family History  Problem Relation Age of Onset  . Heart disease Mother   . Cancer Father        Lung and colon cancer  . Heart disease Father   . Emphysema Maternal Grandfather   . Tuberculosis Maternal Grandmother      Current Outpatient Medications:  .  acetaminophen (TYLENOL) 500 MG tablet, Take 500 mg by mouth every 6 (six) hours as needed for moderate pain. When taking oxycodone 5 mg tablets, Disp: , Rfl:  .  aspirin EC 81 MG tablet, Take 81 mg by mouth daily., Disp: , Rfl:  .  atorvastatin (LIPITOR) 40 MG tablet, Take 1  tablet (40 mg total) by mouth at bedtime., Disp: 90 tablet, Rfl: 3 .  carvedilol (COREG) 12.5 MG tablet, TAKE 1 TABLET BY MOUTH TWICE DAILY, Disp: 180 tablet, Rfl: 3 .  clobetasol cream (TEMOVATE) 1.01 %, Apply 1 application topically as needed (for skin rash)., Disp: 30 g, Rfl: 1 .  empagliflozin (JARDIANCE) 10 MG TABS tablet, Take 10 mg by mouth daily., Disp: 30 tablet, Rfl: 11 .  feeding supplement, ENSURE ENLIVE, (ENSURE ENLIVE) LIQD, Take 237 mLs by mouth 3 (three) times daily between meals., Disp: 90 Bottle, Rfl: 0 .  fluticasone (FLONASE) 50 MCG/ACT nasal spray, Use 2 spray(s) in each nostril once daily, Disp: 48 g, Rfl: 3 .  glucose blood (CONTOUR NEXT TEST) test strip, Check blood sugars 3 times daily., Disp: 300 each, Rfl: 11 .  hydrOXYzine (ATARAX/VISTARIL) 25 MG tablet, Take 1 tablet (25 mg total) by mouth every 8 (eight) hours as needed for itching., Disp: 30 tablet, Rfl: 3 .  Ivermectin (SOOLANTRA) 1 % CREA, Apply topically at bedtime. To face, Disp: , Rfl:  .  lidocaine-prilocaine (EMLA) cream, Apply 1 application topically as needed., Disp: 30 g, Rfl: 3 .  losartan (COZAAR) 100 MG tablet, TAKE 1 TABLET BY MOUTH ONCE DAILY, Disp: 90 tablet, Rfl: 3 .  metFORMIN (GLUCOPHAGE) 1000 MG tablet, TAKE 1 TABLET BY MOUTH TWICE DAILY WITH MEALS, Disp: 180 tablet, Rfl: 3 .  nystatin cream (MYCOSTATIN), Apply topically 2 (two) times daily. (Patient taking differently: Apply topically 2 (two) times daily as needed. ), Disp: 15 g, Rfl: 1 .  sertraline (ZOLOFT) 100 MG tablet, Take 1 tablet (100 mg total) by mouth daily., Disp: 30 tablet, Rfl: 5 .  ondansetron (ZOFRAN) 8 MG tablet, Take 1 tablet (8 mg total) by mouth every 8 (eight) hours as needed for nausea or vomiting (if needed beginning 3 days after chemotherapy). (Patient not taking: Reported on 11/08/2018), Disp: 20 tablet, Rfl: 0 No current facility-administered medications for this visit.   Facility-Administered Medications Ordered in Other  Visits:  .  Influenza vac split quadrivalent PF (FLUZONE HIGH-DOSE) injection 0.5 mL, 0.5 mL, Intramuscular, Once, Karen Kitchens, NP  Ct Chest W Contrast  Result Date: 11/26/2018 CLINICAL DATA:  73 year old male with history of stage IV metastatic urothelial carcinoma with lymph node metastases. Follow-up study. EXAM: CT CHEST, ABDOMEN, AND PELVIS WITH CONTRAST TECHNIQUE: Multidetector CT imaging of the chest, abdomen and pelvis was performed following the standard protocol during bolus administration of intravenous contrast. CONTRAST:  131mL ISOVUE-300 IOPAMIDOL (ISOVUE-300) INJECTION 61% COMPARISON:  CT the chest, abdomen and pelvis 08/29/2018. FINDINGS: CT CHEST FINDINGS Cardiovascular: Heart size is normal. There is no significant pericardial fluid, thickening or pericardial calcification. There is aortic atherosclerosis, as well as atherosclerosis of the great vessels of the mediastinum and the coronary arteries, including calcified atherosclerotic plaque in the left main, left anterior descending, left circumflex and right coronary arteries. Right internal jugular single-lumen porta cath with tip terminating in the distal superior vena cava. Mediastinum/Nodes: No pathologically enlarged mediastinal or hilar lymph nodes. Esophagus is unremarkable in appearance. No axillary lymphadenopathy. Lungs/Pleura: Widespread areas of mild septal thickening are noted throughout the lungs bilaterally, most evident throughout the mid to lower lungs, slightly progressive compared to the prior examination, and clearly progressive compared to more remote prior studies, concerning for interstitial lung disease. No acute consolidative airspace disease. No pleural effusions. No suspicious appearing pulmonary nodules or masses. Musculoskeletal: There are no aggressive appearing lytic or blastic lesions noted in the visualized portions of the skeleton. CT ABDOMEN PELVIS FINDINGS Hepatobiliary: No suspicious cystic or solid  hepatic lesions. No intra or extrahepatic biliary ductal dilatation. Tiny calcified gallstone lying dependently in the gallbladder. No findings to suggest an associated acute cholecystitis at this time. Pancreas: Diffuse fatty atrophy throughout the pancreas. No pancreatic mass. No pancreatic ductal dilatation. No pancreatic or peripancreatic fluid or inflammatory changes. Spleen: Unremarkable. Adrenals/Urinary Tract: Previously identified infiltrative tumor involving the lower pole collecting system of the left kidney extending into the renal pelvis appears grossly similar to the prior examination. This is irregular in shape and therefore difficult to accurately measure, but is estimated to be approximately 1.8 x 3.0 x 1.9 cm (coronal image 92 of series 5 and axial image 68 of series 2). This is associated with decreased perfusion in the lower and interpolar region of the left kidney, as well as focal calyceal dilatation in the interpolar region, similar to the prior study. Multiple other well-defined low-attenuation nonenhancing lesions in both kidneys are compatible with simple cysts, largest of which is exophytic in the upper pole of the right kidney measuring 3.8 cm in diameter. No hydroureteronephrosis. Urinary bladder is normal in appearance. Bilateral adrenal glands are normal in appearance. Stomach/Bowel: Normal appearance of the stomach. No pathologic dilatation of small bowel or colon. Normal appendix. Vascular/Lymphatic: Aortic atherosclerosis, without evidence of aneurysm or dissection in the abdominal or pelvic vasculature. Compared to the prior examination there has been interval worsening of left para-aortic lymphadenopathy adjacent to the renal hilum (axial image 70 of series 2) measuring up to 1.9 cm in short axis (previously 11 mm). No pelvic lymphadenopathy is noted. Reproductive: Prostate gland and seminal vesicles are unremarkable in appearance. Other: No significant volume of ascites.  No  pneumoperitoneum. Musculoskeletal: There are no aggressive appearing lytic or blastic lesions noted in the visualized portions of the skeleton. IMPRESSION: 1. Although the primary lesion in the lower pole collecting system of the left kidney and left renal pelvis appears grossly stable in size compared to the prior examination, there is worsening left para-aortic lymphadenopathy immediately inferior to the left renal hilum, as detailed above, compatible with progressive nodal metastasis. No other definite sites of metastatic disease are noted elsewhere in the abdomen or pelvis. 2. Cholelithiasis without findings to suggest an acute cholecystitis at this time. 3. The appearance of the lungs is compatible with early but progressive interstitial lung disease. Outpatient referral to pulmonology for further evaluation is suggested. 4. 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  Assessment for potential risk factor modification, dietary therapy or pharmacologic therapy may be warranted, if clinically indicated. 5. Additional incidental findings, as above. Electronically Signed   By: Vinnie Langton M.D.   On: 11/26/2018 09:00   Ct Abdomen Pelvis W Contrast  Result Date: 11/26/2018 CLINICAL DATA:  73 year old male with history of stage IV metastatic urothelial carcinoma with lymph node metastases. Follow-up study. EXAM: CT CHEST, ABDOMEN, AND PELVIS WITH CONTRAST TECHNIQUE: Multidetector CT imaging of the chest, abdomen and pelvis was performed following the standard protocol during bolus administration of intravenous contrast. CONTRAST:  172mL ISOVUE-300 IOPAMIDOL (ISOVUE-300) INJECTION 61% COMPARISON:  CT the chest, abdomen and pelvis 08/29/2018. FINDINGS: CT CHEST FINDINGS Cardiovascular: Heart size is normal. There is no significant pericardial fluid, thickening or pericardial calcification. There is aortic atherosclerosis, as well as atherosclerosis of the great vessels  of the mediastinum and the coronary arteries, including calcified atherosclerotic plaque in the left main, left anterior descending, left circumflex and right coronary arteries. Right internal jugular single-lumen porta cath with tip terminating in the distal superior vena cava. Mediastinum/Nodes: No pathologically enlarged mediastinal or hilar lymph nodes. Esophagus is unremarkable in appearance. No axillary lymphadenopathy. Lungs/Pleura: Widespread areas of mild septal thickening are noted throughout the lungs bilaterally, most evident throughout the mid to lower lungs, slightly progressive compared to the prior examination, and clearly progressive compared to more remote prior studies, concerning for interstitial lung disease. No acute consolidative airspace disease. No pleural effusions. No suspicious appearing pulmonary nodules or masses. Musculoskeletal: There are no aggressive appearing lytic or blastic lesions noted in the visualized portions of the skeleton. CT ABDOMEN PELVIS FINDINGS Hepatobiliary: No suspicious cystic or solid hepatic lesions. No intra or extrahepatic biliary ductal dilatation. Tiny calcified gallstone lying dependently in the gallbladder. No findings to suggest an associated acute cholecystitis at this time. Pancreas: Diffuse fatty atrophy throughout the pancreas. No pancreatic mass. No pancreatic ductal dilatation. No pancreatic or peripancreatic fluid or inflammatory changes. Spleen: Unremarkable. Adrenals/Urinary Tract: Previously identified infiltrative tumor involving the lower pole collecting system of the left kidney extending into the renal pelvis appears grossly similar to the prior examination. This is irregular in shape and therefore difficult to accurately measure, but is estimated to be approximately 1.8 x 3.0 x 1.9 cm (coronal image 92 of series 5 and axial image 68 of series 2). This is associated with decreased perfusion in the lower and interpolar region of the left  kidney, as well as focal calyceal dilatation in the interpolar region, similar to the prior study. Multiple other well-defined low-attenuation nonenhancing lesions in both kidneys are compatible with simple cysts, largest of which is exophytic in the upper pole of the right kidney measuring 3.8 cm in diameter. No hydroureteronephrosis. Urinary bladder is normal in appearance. Bilateral adrenal glands are normal in appearance. Stomach/Bowel: Normal appearance of the stomach. No pathologic dilatation of small bowel or colon. Normal appendix. Vascular/Lymphatic: Aortic atherosclerosis, without evidence of aneurysm or dissection in the abdominal or pelvic vasculature. Compared to the prior examination there has been interval worsening of left para-aortic lymphadenopathy adjacent to the renal hilum (axial image 70 of series 2) measuring up to 1.9 cm in short axis (previously 11 mm). No pelvic lymphadenopathy is noted. Reproductive: Prostate gland and seminal vesicles are unremarkable in appearance. Other: No significant volume of ascites.  No pneumoperitoneum. Musculoskeletal: There are no aggressive appearing lytic or blastic lesions noted in the visualized portions of the skeleton. IMPRESSION: 1. Although the primary lesion in the lower  pole collecting system of the left kidney and left renal pelvis appears grossly stable in size compared to the prior examination, there is worsening left para-aortic lymphadenopathy immediately inferior to the left renal hilum, as detailed above, compatible with progressive nodal metastasis. No other definite sites of metastatic disease are noted elsewhere in the abdomen or pelvis. 2. Cholelithiasis without findings to suggest an acute cholecystitis at this time. 3. The appearance of the lungs is compatible with early but progressive interstitial lung disease. Outpatient referral to pulmonology for further evaluation is suggested. 4. Aortic atherosclerosis, in addition to left main and  3 vessel coronary artery disease. Assessment for potential risk factor modification, dietary therapy or pharmacologic therapy may be warranted, if clinically indicated. 5. Additional incidental findings, as above. Electronically Signed   By: Vinnie Langton M.D.   On: 11/26/2018 09:00    No images are attached to the encounter.   CMP Latest Ref Rng & Units 11/11/2018  Glucose 70 - 99 mg/dL 227(H)  BUN 8 - 23 mg/dL 20  Creatinine 0.61 - 1.24 mg/dL 0.79  Sodium 135 - 145 mmol/L 136  Potassium 3.5 - 5.1 mmol/L 4.3  Chloride 98 - 111 mmol/L 100  CO2 22 - 32 mmol/L 26  Calcium 8.9 - 10.3 mg/dL 9.5  Total Protein 6.5 - 8.1 g/dL 7.2  Total Bilirubin 0.3 - 1.2 mg/dL 0.7  Alkaline Phos 38 - 126 U/L 90  AST 15 - 41 U/L 20  ALT 0 - 44 U/L 13   CBC Latest Ref Rng & Units 11/11/2018  WBC 4.0 - 10.5 K/uL 7.4  Hemoglobin 13.0 - 17.0 g/dL 11.8(L)  Hematocrit 39.0 - 52.0 % 35.4(L)  Platelets 150 - 400 K/uL 195     Observation/objective: Appears in no acute distress over the video visit today.  Breathing is nonlabored  Assessment and plan: Patient is a 55 4Y male with history of metastatic urothelial carcinoma with metastases to lymph nodes currently on second line Tecentriq  I have reviewed CT chest abdomen and pelvis images independently.  We also discussed his case at the tumor board.  Overall scans are consistent with stable size of the primary tumor.  However Left para-aortic adenopathy has increased from 1.1 cm to 1.9 cm.  This is concerning for progressive disease.  Previously patient had insufficient tissue for F GFR testing to see if he would be a candidate for [place].  Plan at this time is to proceed with a ureteroscopy for repeat tissue sampling to check for FGFR testing.  If he is positive for this mutation he would be a candidate for [employer].  If he does not have this mutation- next line of treatment recommended would be Padcev. Padcev would be given 3 weeks on 1 week off iron infusion.   1 of the main side effects would be peripheral neuropathy along with nausea vomiting and low blood counts.  I would prefer to wait for biopsy to see if he would be a candidate for [facility] as as it is given pills form and will translate into a better quality of life with the patient.  Treatment will be given with a palliative intent  Currently his biopsy is likely to take a couple of weeks to get it done as he awaits cardiology turns.  The results of FGFR testing could take another week from that point onwards.  I will therefore see him next week in my office and given the next cycle of Keytruda since he did have mild  progression but no new lesions on his CT scan  Patient noted to have a mildly elevated TSH of 6 during his prior labs.  I will be checking free T3 and free T4 with labs this time  Patient has baseline interstitial lung disease which was also reviewed at time of tumor board and has remained stable.  Follow-up instructions:  I discussed the assessment and treatment plan with the patient. The patient was provided an opportunity to ask questions and all were answered. The patient agreed with the plan and demonstrated an understanding of the instructions.   The patient was advised to call back or seek an in-person evaluation if the symptoms worsen or if the condition fails to improve as anticipated.  Visit Diagnosis: 1. Urothelial cancer ([place])     Dr. Randa Evens, MD, MPH [facility] at [facility] [phone] 12/01/2018 7:43 AM

## 2018-12-03 ENCOUNTER — Encounter: Payer: Self-pay | Admitting: Oncology

## 2018-12-03 ENCOUNTER — Inpatient Hospital Stay: Payer: Medicare Other

## 2018-12-03 ENCOUNTER — Inpatient Hospital Stay (HOSPITAL_BASED_OUTPATIENT_CLINIC_OR_DEPARTMENT_OTHER): Payer: Medicare Other | Admitting: Oncology

## 2018-12-03 ENCOUNTER — Other Ambulatory Visit: Payer: Self-pay

## 2018-12-03 VITALS — BP 111/72 | HR 69 | Temp 96.1°F | Resp 18 | Ht 68.0 in | Wt 159.7 lb

## 2018-12-03 DIAGNOSIS — C689 Malignant neoplasm of urinary organ, unspecified: Secondary | ICD-10-CM

## 2018-12-03 DIAGNOSIS — D696 Thrombocytopenia, unspecified: Secondary | ICD-10-CM

## 2018-12-03 DIAGNOSIS — E538 Deficiency of other specified B group vitamins: Secondary | ICD-10-CM | POA: Diagnosis not present

## 2018-12-03 DIAGNOSIS — I2510 Atherosclerotic heart disease of native coronary artery without angina pectoris: Secondary | ICD-10-CM

## 2018-12-03 DIAGNOSIS — J849 Interstitial pulmonary disease, unspecified: Secondary | ICD-10-CM | POA: Diagnosis not present

## 2018-12-03 DIAGNOSIS — E119 Type 2 diabetes mellitus without complications: Secondary | ICD-10-CM | POA: Diagnosis not present

## 2018-12-03 DIAGNOSIS — Z79899 Other long term (current) drug therapy: Secondary | ICD-10-CM | POA: Diagnosis not present

## 2018-12-03 DIAGNOSIS — Z5112 Encounter for antineoplastic immunotherapy: Secondary | ICD-10-CM

## 2018-12-03 DIAGNOSIS — R7989 Other specified abnormal findings of blood chemistry: Secondary | ICD-10-CM

## 2018-12-03 DIAGNOSIS — Z87891 Personal history of nicotine dependence: Secondary | ICD-10-CM

## 2018-12-03 DIAGNOSIS — C642 Malignant neoplasm of left kidney, except renal pelvis: Secondary | ICD-10-CM | POA: Diagnosis not present

## 2018-12-03 DIAGNOSIS — R5382 Chronic fatigue, unspecified: Secondary | ICD-10-CM

## 2018-12-03 DIAGNOSIS — Z95828 Presence of other vascular implants and grafts: Secondary | ICD-10-CM

## 2018-12-03 LAB — CBC WITH DIFFERENTIAL/PLATELET
Abs Immature Granulocytes: 0.02 10*3/uL (ref 0.00–0.07)
Basophils Absolute: 0.1 10*3/uL (ref 0.0–0.1)
Basophils Relative: 1 %
Eosinophils Absolute: 0.5 10*3/uL (ref 0.0–0.5)
Eosinophils Relative: 7 %
HCT: 33.2 % — ABNORMAL LOW (ref 39.0–52.0)
Hemoglobin: 11.2 g/dL — ABNORMAL LOW (ref 13.0–17.0)
Immature Granulocytes: 0 %
Lymphocytes Relative: 14 %
Lymphs Abs: 1 10*3/uL (ref 0.7–4.0)
MCH: 29.3 pg (ref 26.0–34.0)
MCHC: 33.7 g/dL (ref 30.0–36.0)
MCV: 86.9 fL (ref 80.0–100.0)
Monocytes Absolute: 0.8 10*3/uL (ref 0.1–1.0)
Monocytes Relative: 11 %
Neutro Abs: 5.2 10*3/uL (ref 1.7–7.7)
Neutrophils Relative %: 67 %
Platelets: 210 10*3/uL (ref 150–400)
RBC: 3.82 MIL/uL — ABNORMAL LOW (ref 4.22–5.81)
RDW: 14 % (ref 11.5–15.5)
WBC: 7.7 10*3/uL (ref 4.0–10.5)
nRBC: 0 % (ref 0.0–0.2)

## 2018-12-03 LAB — COMPREHENSIVE METABOLIC PANEL
ALT: 12 U/L (ref 0–44)
AST: 15 U/L (ref 15–41)
Albumin: 3.7 g/dL (ref 3.5–5.0)
Alkaline Phosphatase: 93 U/L (ref 38–126)
Anion gap: 9 (ref 5–15)
BUN: 17 mg/dL (ref 8–23)
CO2: 28 mmol/L (ref 22–32)
Calcium: 9.6 mg/dL (ref 8.9–10.3)
Chloride: 101 mmol/L (ref 98–111)
Creatinine, Ser: 0.74 mg/dL (ref 0.61–1.24)
GFR calc Af Amer: >60 mL/min (ref >60)
GFR calc non Af Amer: >60 mL/min (ref >60)
Glucose, Bld: 148 mg/dL — ABNORMAL HIGH (ref 70–99)
Potassium: 4 mmol/L (ref 3.5–5.1)
Sodium: 138 mmol/L (ref 135–145)
Total Bilirubin: 1 mg/dL (ref 0.3–1.2)
Total Protein: 7.2 g/dL (ref 6.5–8.1)

## 2018-12-03 LAB — T4, FREE: Free T4: 1.08 ng/dL (ref 0.82–1.77)

## 2018-12-03 LAB — MAGNESIUM: Magnesium: 1.9 mg/dL (ref 1.7–2.4)

## 2018-12-03 MED ORDER — SODIUM CHLORIDE 0.9% FLUSH
10.0000 mL | Freq: Once | INTRAVENOUS | Status: AC
  Administered 2018-12-03: 09:00:00 10 mL via INTRAVENOUS
  Filled 2018-12-03: qty 10

## 2018-12-03 MED ORDER — HEPARIN SOD (PORK) LOCK FLUSH 100 UNIT/ML IV SOLN
500.0000 [IU] | Freq: Once | INTRAVENOUS | Status: AC | PRN
  Administered 2018-12-03: 500 [IU]
  Filled 2018-12-03: qty 5

## 2018-12-03 MED ORDER — SODIUM CHLORIDE 0.9 % IV SOLN
Freq: Once | INTRAVENOUS | Status: AC
  Administered 2018-12-03: 10:00:00 via INTRAVENOUS
  Filled 2018-12-03: qty 250

## 2018-12-03 MED ORDER — SODIUM CHLORIDE 0.9 % IV SOLN
1200.0000 mg | Freq: Once | INTRAVENOUS | Status: AC
  Administered 2018-12-03: 1200 mg via INTRAVENOUS
  Filled 2018-12-03: qty 20

## 2018-12-03 NOTE — Progress Notes (Signed)
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 [name] so far for his metastatic urothelial carcinoma. This was originally diagnosed in May 2019. He was noted to have a filling defect in the lower pole collecting system of the left kidney along with para-aortic and retroperitoneal adenopathy concerning for metastatic disease. He underwent left ureteroscopy and renal pelvis biopsy which revealed small fragments of high-grade urothelial carcinoma with small focus of invasion. He was started on carboplatin and gemcitabine chemotherapy in June 2019 and was continued on 05/27/2018 for 8 cycles. He tolerated chemotherapy well except for chemo-induced anemia for which she has been getting Procrit every 2 weeks. He did have response to his disease based on scans in August 2019. However repeat scan on 05/31/2018 showed increase in the size of the primary tumor from 1.2 to 1.9 cm and increase in left para-aortic adenopathy from 0.9 to 1.3 cm. Periportal adenopathy was stable at 1.4 cm. Second line immunotherapy was recommended. His initial biopsy specimen did not have enough sample to undergo  FGFR mutation testing. He also has mild interstitial lung disease for which he sees pulmonary but he is not on home oxygen. He has B12 deficiency for which he is on oral B12. Also has diabetes and coronary artery disease.Tecentriq started on 06/17/2018   Interval history-reports chronic fatigue but no other new complaints at this time.  ECOG PS- 1 Pain scale- 0 Opioid associated constipation- no  Review of systems- Review of Systems  Constitutional: Positive for malaise/fatigue. Negative for chills, fever and weight loss.  HENT: Negative for congestion, ear discharge and nosebleeds.   Eyes: Negative for blurred vision.  Respiratory: Negative for cough, hemoptysis, sputum production, shortness of breath and wheezing.   Cardiovascular: Negative for chest pain, palpitations, orthopnea and claudication.  Gastrointestinal: Negative for abdominal pain, blood in stool, constipation, diarrhea, heartburn, melena, nausea and vomiting.  Genitourinary: Negative for dysuria, flank pain, frequency, hematuria and urgency.  Musculoskeletal: Negative for back pain, joint pain and myalgias.  Skin: Negative for rash.  Neurological: Negative for dizziness, tingling, focal weakness, seizures, weakness and headaches.  Endo/Heme/Allergies: Does not bruise/bleed easily.  Psychiatric/Behavioral: Negative for depression and suicidal ideas. The patient does not have insomnia.       Allergies  Allergen Reactions   Sulfa Antibiotics Other (See Comments)    Joint pain   Ace Inhibitors Cough   Invokana [Canagliflozin] Other (See Comments)    Leg pain   Penicillins Rash    Has patient had a PCN reaction causing immediate rash, facial/tongue/throat swelling, SOB or lightheadedness with hypotension: Yes Has patient had a PCN reaction causing severe rash involving mucus membranes or skin necrosis: No Has patient had a PCN reaction that required hospitalization: No Has patient had a PCN reaction occurring  within the last 10 years: No If all of  the above answers are "NO", then may proceed with Cephalosporin use.     Past Medical History:  Diagnosis Date   Acid reflux    Anxiety    Depression    Diabetes mellitus without complication ([place])    Hyperlipidemia    Hypertension    Myocardial infarction ([place]) 1995   Sleep apnea    Urothelial cancer ([place]) 11/2017   Left Urothelial mass, chemo tx's     Past Surgical History:  Procedure Laterality Date   CATARACT EXTRACTION Left    COLONOSCOPY  2010   [facility]   COLONOSCOPY WITH PROPOFOL N/A 10/04/2016   Procedure: COLONOSCOPY WITH PROPOFOL;  Surgeon: [name], MD;  Location: [facility];  Service: Endoscopy;  Laterality: N/A;   [place], 2000, 2001, 2014   CYSTOSCOPY W/ RETROGRADES Bilateral 12/10/2017   Procedure: CYSTOSCOPY WITH RETROGRADE PYELOGRAM;  Surgeon: [name], MD;  Location: [facility];  Service: Urology;  Laterality: Bilateral;   CYSTOSCOPY WITH STENT PLACEMENT Left 12/10/2017   Procedure: CYSTOSCOPY WITH STENT PLACEMENT;  Surgeon: [name], MD;  Location: [facility];  Service: Urology;  Laterality: Left;   INGUINAL HERNIA REPAIR Right 08/09/2015   Procedure: HERNIA REPAIR INGUINAL ADULT;  Surgeon: [name], MD;  Location: [facility];  Service: General;  Laterality: Right;   NASAL SINUS SURGERY     nuclear stress test     PORTA CATH INSERTION N/A 12/19/2017   Procedure: PORTA CATH INSERTION;  Surgeon: [name], MD;  Location: [facility];  Service: Cardiovascular;  Laterality: N/A;   URETERAL BIOPSY Left 12/10/2017   Procedure: URETERAL & renal PELVIS BIOPSY;  Surgeon: [name], MD;  Location: [facility];  Service: Urology;  Laterality: Left;   URETEROSCOPY Left 12/10/2017   Procedure: URETEROSCOPY;  Surgeon: [name], MD;  Location: [facility];  Service: Urology;  Laterality: Left;    Social History   Socioeconomic  History   Marital status: Married    Spouse name: Not on file   Number of children: 1   Years of education: Not on file   Highest education level: Associate degree: occupational, Hotel manager, or vocational program  Occupational History   Occupation: retired  Scientist, product/process development strain: Not hard at [employer] insecurity:    Worry: Never true    Inability: Never true   Transportation needs:    Medical: No    Non-medical: No  Tobacco Use   Smoking status: Former Smoker    Types: Cigars    Last attempt to quit: 10/09/1978    Years since quitting: 40.1   Smokeless tobacco: Former Systems developer    Types: Chew   Tobacco comment: on occasion  Substance and Sexual Activity   Alcohol use: Not Currently   Drug use: No   Sexual activity: Not Currently  Lifestyle   Physical activity:    Days per week: Not on file    Minutes per session: Not on file   Stress: Only a little  Relationships   Social connections:    Talks on phone: Not on file    Gets together: Not on file    Attends religious service: Not on file    Active member of club or organization: Not on file    Attends meetings of clubs or organizations: Not on file    Relationship status: Not on file   Intimate partner violence:    Fear of current or  ex partner: Not on file    Emotionally abused: Not on file    Physically abused: Not on file    Forced sexual activity: Not on file  Other Topics Concern   Not on file  Social History Narrative   Not on file    Family History  Problem Relation Age of Onset   Heart disease Mother    Cancer Father        Lung and colon cancer   Heart disease Father    Emphysema Maternal Grandfather    Tuberculosis Maternal Grandmother      Current Outpatient Medications:    acetaminophen (TYLENOL) 500 MG tablet, Take 500 mg by mouth every 6 (six) hours as needed for moderate pain. When taking oxycodone 5 mg tablets, Disp: , Rfl:    aspirin EC 81 MG  tablet, Take 81 mg by mouth daily., Disp: , Rfl:    atorvastatin (LIPITOR) 40 MG tablet, Take 1 tablet (40 mg total) by mouth at bedtime., Disp: 90 tablet, Rfl: 3   carvedilol (COREG) 12.5 MG tablet, TAKE 1 TABLET BY MOUTH TWICE DAILY, Disp: 180 tablet, Rfl: 3   clobetasol cream (TEMOVATE) 7.00 %, Apply 1 application topically as needed (for skin rash)., Disp: 30 g, Rfl: 1   empagliflozin (JARDIANCE) 10 MG TABS tablet, Take 10 mg by mouth daily., Disp: 30 tablet, Rfl: 11   feeding supplement, ENSURE ENLIVE, (ENSURE ENLIVE) LIQD, Take 237 mLs by mouth 3 (three) times daily between meals., Disp: 90 Bottle, Rfl: 0   fluticasone (FLONASE) 50 MCG/ACT nasal spray, Use 2 spray(s) in each nostril once daily, Disp: 48 g, Rfl: 3   glucose blood (CONTOUR NEXT TEST) test strip, Check blood sugars 3 times daily., Disp: 300 each, Rfl: 11   hydrOXYzine (ATARAX/VISTARIL) 25 MG tablet, Take 1 tablet (25 mg total) by mouth every 8 (eight) hours as needed for itching., Disp: 30 tablet, Rfl: 3   Ivermectin (SOOLANTRA) 1 % CREA, Apply topically at bedtime. To face, Disp: , Rfl:    lidocaine-prilocaine (EMLA) cream, Apply 1 application topically as needed., Disp: 30 g, Rfl: 3   losartan (COZAAR) 100 MG tablet, TAKE 1 TABLET BY MOUTH ONCE DAILY, Disp: 90 tablet, Rfl: 3   metFORMIN (GLUCOPHAGE) 1000 MG tablet, TAKE 1 TABLET BY MOUTH TWICE DAILY WITH MEALS, Disp: 180 tablet, Rfl: 3   nystatin cream (MYCOSTATIN), Apply topically 2 (two) times daily. (Patient taking differently: Apply topically 2 (two) times daily as needed. ), Disp: 15 g, Rfl: 1   ondansetron (ZOFRAN) 8 MG tablet, Take 1 tablet (8 mg total) by mouth every 8 (eight) hours as needed for nausea or vomiting (if needed beginning 3 days after chemotherapy)., Disp: 20 tablet, Rfl: 0   sertraline (ZOLOFT) 100 MG tablet, Take 1 tablet (100 mg total) by mouth daily., Disp: 30 tablet, Rfl: 5 No current facility-administered medications for this visit.    Facility-Administered Medications Ordered in Other Visits:    atezolizumab (TECENTRIQ) 1,200 mg in sodium chloride 0.9 % 250 mL chemo infusion, 1,200 mg, Intravenous, Once, Sindy Guadeloupe, MD   heparin lock flush 100 unit/mL, 500 Units, Intracatheter, Once PRN, Sindy Guadeloupe, MD   Influenza vac split quadrivalent PF (FLUZONE HIGH-DOSE) injection 0.5 mL, 0.5 mL, Intramuscular, Once, Karen Kitchens, NP  Physical exam:  Vitals:   12/03/18 0930  BP: 111/72  Pulse: 69  Resp: 18  Temp: (!) 96.1 F (35.6 C)  TempSrc: Tympanic  Weight: 159 lb 11.2 oz (72.4  kg)  Height: '5\' 8"'$  (1.727 m)   Physical Exam Constitutional:      Comments: Thin elderly gentleman in no acute distress  HENT:     Head: Normocephalic and atraumatic.  Eyes:     Pupils: Pupils are equal, round, and reactive to light.  Neck:     Musculoskeletal: Normal range of motion.  Cardiovascular:     Rate and Rhythm: Normal rate and regular rhythm.     Heart sounds: Normal heart sounds.  Pulmonary:     Effort: Pulmonary effort is normal.     Breath sounds: Normal breath sounds.  Abdominal:     General: Bowel sounds are normal.     Palpations: Abdomen is soft.  Skin:    General: Skin is warm and dry.  Neurological:     Mental Status: He is alert and oriented to person, place, and time.      CMP Latest Ref Rng & Units 12/03/2018  Glucose 70 - 99 mg/dL 148(H)  BUN 8 - 23 mg/dL 17  Creatinine 0.61 - 1.24 mg/dL 0.74  Sodium 135 - 145 mmol/L 138  Potassium 3.5 - 5.1 mmol/L 4.0  Chloride 98 - 111 mmol/L 101  CO2 22 - 32 mmol/L 28  Calcium 8.9 - 10.3 mg/dL 9.6  Total Protein 6.5 - 8.1 g/dL 7.2  Total Bilirubin 0.3 - 1.2 mg/dL 1.0  Alkaline Phos 38 - 126 U/L 93  AST 15 - 41 U/L 15  ALT 0 - 44 U/L 12   CBC Latest Ref Rng & Units 12/03/2018  WBC 4.0 - 10.5 K/uL 7.7  Hemoglobin 13.0 - 17.0 g/dL 11.2(L)  Hematocrit 39.0 - 52.0 % 33.2(L)  Platelets 150 - 400 K/uL 210    No images are attached to the  encounter.  Ct Chest W Contrast  Result Date: 11/26/2018 CLINICAL DATA:  73 year old male with history of stage IV metastatic urothelial carcinoma with lymph node metastases. Follow-up study. EXAM: CT CHEST, ABDOMEN, AND PELVIS WITH CONTRAST TECHNIQUE: Multidetector CT imaging of the chest, abdomen and pelvis was performed following the standard protocol during bolus administration of intravenous contrast. CONTRAST:  155m ISOVUE-300 IOPAMIDOL (ISOVUE-300) INJECTION 61% COMPARISON:  CT the chest, abdomen and pelvis 08/29/2018. FINDINGS: CT CHEST FINDINGS Cardiovascular: Heart size is normal. There is no significant pericardial fluid, thickening or pericardial calcification. There is aortic atherosclerosis, as well as atherosclerosis of the great vessels of the mediastinum and the coronary arteries, including calcified atherosclerotic plaque in the left main, left anterior descending, left circumflex and right coronary arteries. Right internal jugular single-lumen porta cath with tip terminating in the distal superior vena cava. Mediastinum/Nodes: No pathologically enlarged mediastinal or hilar lymph nodes. Esophagus is unremarkable in appearance. No axillary lymphadenopathy. Lungs/Pleura: Widespread areas of mild septal thickening are noted throughout the lungs bilaterally, most evident throughout the mid to lower lungs, slightly progressive compared to the prior examination, and clearly progressive compared to more remote prior studies, concerning for interstitial lung disease. No acute consolidative airspace disease. No pleural effusions. No suspicious appearing pulmonary nodules or masses. Musculoskeletal: There are no aggressive appearing lytic or blastic lesions noted in the visualized portions of the skeleton. CT ABDOMEN PELVIS FINDINGS Hepatobiliary: No suspicious cystic or solid hepatic lesions. No intra or extrahepatic biliary ductal dilatation. Tiny calcified gallstone lying dependently in the  gallbladder. No findings to suggest an associated acute cholecystitis at this time. Pancreas: Diffuse fatty atrophy throughout the pancreas. No pancreatic mass. No pancreatic ductal dilatation. No pancreatic or peripancreatic  fluid or inflammatory changes. Spleen: Unremarkable. Adrenals/Urinary Tract: Previously identified infiltrative tumor involving the lower pole collecting system of the left kidney extending into the renal pelvis appears grossly similar to the prior examination. This is irregular in shape and therefore difficult to accurately measure, but is estimated to be approximately 1.8 x 3.0 x 1.9 cm (coronal image 92 of series 5 and axial image 68 of series 2). This is associated with decreased perfusion in the lower and interpolar region of the left kidney, as well as focal calyceal dilatation in the interpolar region, similar to the prior study. Multiple other well-defined low-attenuation nonenhancing lesions in both kidneys are compatible with simple cysts, largest of which is exophytic in the upper pole of the right kidney measuring 3.8 cm in diameter. No hydroureteronephrosis. Urinary bladder is normal in appearance. Bilateral adrenal glands are normal in appearance. Stomach/Bowel: Normal appearance of the stomach. No pathologic dilatation of small bowel or colon. Normal appendix. Vascular/Lymphatic: Aortic atherosclerosis, without evidence of aneurysm or dissection in the abdominal or pelvic vasculature. Compared to the prior examination there has been interval worsening of left para-aortic lymphadenopathy adjacent to the renal hilum (axial image 70 of series 2) measuring up to 1.9 cm in short axis (previously 11 mm). No pelvic lymphadenopathy is noted. Reproductive: Prostate gland and seminal vesicles are unremarkable in appearance. Other: No significant volume of ascites.  No pneumoperitoneum. Musculoskeletal: There are no aggressive appearing lytic or blastic lesions noted in the visualized  portions of the skeleton. IMPRESSION: 1. Although the primary lesion in the lower pole collecting system of the left kidney and left renal pelvis appears grossly stable in size compared to the prior examination, there is worsening left para-aortic lymphadenopathy immediately inferior to the left renal hilum, as detailed above, compatible with progressive nodal metastasis. No other definite sites of metastatic disease are noted elsewhere in the abdomen or pelvis. 2. Cholelithiasis without findings to suggest an acute cholecystitis at this time. 3. The appearance of the lungs is compatible with early but progressive interstitial lung disease. Outpatient referral to pulmonology for further evaluation is suggested. 4. 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 Assessment for potential risk factor modification, dietary therapy or pharmacologic therapy may be warranted, if clinically indicated. 5. Additional incidental findings, as above. Electronically Signed   By: Vinnie Langton M.D.   On: 11/26/2018 09:00   Ct Abdomen Pelvis W Contrast  Result Date: 11/26/2018 CLINICAL DATA:  73 year old male with history of stage IV metastatic urothelial carcinoma with lymph node metastases. Follow-up study. EXAM: CT CHEST, ABDOMEN, AND PELVIS WITH CONTRAST TECHNIQUE: Multidetector CT imaging of the chest, abdomen and pelvis was performed following the standard protocol during bolus administration of intravenous contrast. CONTRAST:  139m ISOVUE-300 IOPAMIDOL (ISOVUE-300) INJECTION 61% COMPARISON:  CT the chest, abdomen and pelvis 08/29/2018. FINDINGS: CT CHEST FINDINGS Cardiovascular: Heart size is normal. There is no significant pericardial fluid, thickening or pericardial calcification. There is aortic atherosclerosis, as well as atherosclerosis of the great vessels of the mediastinum and the coronary arteries, including calcified atherosclerotic plaque in the left main, left  anterior descending, left circumflex and right coronary arteries. Right internal jugular single-lumen porta cath with tip terminating in the distal superior vena cava. Mediastinum/Nodes: No pathologically enlarged mediastinal or hilar lymph nodes. Esophagus is unremarkable in appearance. No axillary lymphadenopathy. Lungs/Pleura: Widespread areas of mild septal thickening are noted throughout the lungs bilaterally, most evident throughout the mid to lower lungs, slightly progressive compared  to the prior examination, and clearly progressive compared to more remote prior studies, concerning for interstitial lung disease. No acute consolidative airspace disease. No pleural effusions. No suspicious appearing pulmonary nodules or masses. Musculoskeletal: There are no aggressive appearing lytic or blastic lesions noted in the visualized portions of the skeleton. CT ABDOMEN PELVIS FINDINGS Hepatobiliary: No suspicious cystic or solid hepatic lesions. No intra or extrahepatic biliary ductal dilatation. Tiny calcified gallstone lying dependently in the gallbladder. No findings to suggest an associated acute cholecystitis at this time. Pancreas: Diffuse fatty atrophy throughout the pancreas. No pancreatic mass. No pancreatic ductal dilatation. No pancreatic or peripancreatic fluid or inflammatory changes. Spleen: Unremarkable. Adrenals/Urinary Tract: Previously identified infiltrative tumor involving the lower pole collecting system of the left kidney extending into the renal pelvis appears grossly similar to the prior examination. This is irregular in shape and therefore difficult to accurately measure, but is estimated to be approximately 1.8 x 3.0 x 1.9 cm (coronal image 92 of series 5 and axial image 68 of series 2). This is associated with decreased perfusion in the lower and interpolar region of the left kidney, as well as focal calyceal dilatation in the interpolar region, similar to the prior study. Multiple other  well-defined low-attenuation nonenhancing lesions in both kidneys are compatible with simple cysts, largest of which is exophytic in the upper pole of the right kidney measuring 3.8 cm in diameter. No hydroureteronephrosis. Urinary bladder is normal in appearance. Bilateral adrenal glands are normal in appearance. Stomach/Bowel: Normal appearance of the stomach. No pathologic dilatation of small bowel or colon. Normal appendix. Vascular/Lymphatic: Aortic atherosclerosis, without evidence of aneurysm or dissection in the abdominal or pelvic vasculature. Compared to the prior examination there has been interval worsening of left para-aortic lymphadenopathy adjacent to the renal hilum (axial image 70 of series 2) measuring up to 1.9 cm in short axis (previously 11 mm). No pelvic lymphadenopathy is noted. Reproductive: Prostate gland and seminal vesicles are unremarkable in appearance. Other: No significant volume of ascites.  No pneumoperitoneum. Musculoskeletal: There are no aggressive appearing lytic or blastic lesions noted in the visualized portions of the skeleton. IMPRESSION: 1. Although the primary lesion in the lower pole collecting system of the left kidney and left renal pelvis appears grossly stable in size compared to the prior examination, there is worsening left para-aortic lymphadenopathy immediately inferior to the left renal hilum, as detailed above, compatible with progressive nodal metastasis. No other definite sites of metastatic disease are noted elsewhere in the abdomen or pelvis. 2. Cholelithiasis without findings to suggest an acute cholecystitis at this time. 3. The appearance of the lungs is compatible with early but progressive interstitial lung disease. Outpatient referral to pulmonology for further evaluation is suggested. 4. 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 Assessment for potential risk factor modification, dietary therapy or  pharmacologic therapy may be warranted, if clinically indicated. 5. Additional incidental findings, as above. Electronically Signed   By: Vinnie Langton M.D.   On: 11/26/2018 09:00     Assessment and plan- Patient is a 73 y.o. 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  He is here for on treatment assessment prior to next cycle of Tecentriq  I have discussed the results of CT scan at my prior visit which shows disease progression in the lymph nodes.  At this time we are awaiting cardiology clearance followed by repeat biopsy to obtain EGFR mutation testing.  If her  GFR mutation testing is positive he would be a candidate for [place].  If not he would be considered for padcev.  It will still take 3 to 4 weeks for [place] to obtain the results of the biopsy.  I will therefore proceed with the next cycle of Tecentriq at this time.  Free T3-T4 is currently pending.  He had a mildly elevated TSH at prior visit.  I will tentatively see him back in 3 weeks with CBC with differential, CMP.  At that time we will consider him for [facility] versus padcev based on the results of biopsy   Visit Diagnosis 1. Encounter for antineoplastic immunotherapy   2. Urothelial cancer ([place])      Dr. Randa Evens, MD, MPH [facility] [phone] 12/03/2018 10:20 AM

## 2018-12-03 NOTE — Progress Notes (Signed)
Pt here for treatment today, appetite not been good as normal for few months. Has fever blister and has not had one in several years. He has some questions about ct scan- he wants it to be more simpler language

## 2018-12-04 LAB — T3, FREE: T3, Free: 2 pg/mL (ref 2.0–4.4)

## 2018-12-05 ENCOUNTER — Other Ambulatory Visit: Payer: Self-pay

## 2018-12-05 ENCOUNTER — Encounter
Admission: RE | Admit: 2018-12-05 | Discharge: 2018-12-05 | Disposition: A | Discharge status: Home / Self Care | Payer: Medicare Other | Source: Ambulatory Visit | Attending: Urology | Admitting: Urology

## 2018-12-05 DIAGNOSIS — Z1159 Encounter for screening for other viral diseases: Secondary | ICD-10-CM | POA: Insufficient documentation

## 2018-12-05 DIAGNOSIS — Z01818 Encounter for other preprocedural examination: Secondary | ICD-10-CM | POA: Diagnosis not present

## 2018-12-05 DIAGNOSIS — I10 Essential (primary) hypertension: Secondary | ICD-10-CM | POA: Insufficient documentation

## 2018-12-05 DIAGNOSIS — I219 Acute myocardial infarction, unspecified: Secondary | ICD-10-CM | POA: Diagnosis not present

## 2018-12-05 LAB — URINALYSIS, ROUTINE W REFLEX MICROSCOPIC
Bacteria, UA: NONE SEEN
Bilirubin Urine: NEGATIVE
Glucose, UA: >=500 mg/dL — AB
Hgb urine dipstick: NEGATIVE
Ketones, ur: NEGATIVE mg/dL
Leukocytes,Ua: NEGATIVE
Nitrite: NEGATIVE
Protein, ur: NEGATIVE mg/dL
Specific Gravity, Urine: 1.03 (ref 1.005–1.030)
pH: 6 (ref 5.0–8.0)

## 2018-12-05 NOTE — Patient Instructions (Signed)
Your procedure is scheduled on: Mon 12/09/18 Report to Day Surgery. To find out your arrival time please call [phone] between 1PM - 3PM on Tomorrow.  Remember: Instructions that are not followed completely may result in serious medical risk,  up to and including death, or upon the discretion of your surgeon and anesthesiologist your  surgery may need to be rescheduled.     _X__ 1. Do not eat food after midnight the night before your procedure.                 No gum chewing or hard candies. You may drink clear liquids up to 2 hours                 before you are scheduled to arrive for your surgery- DO not drink clear                 liquids within 2 hours of the start of your surgery.                 Clear Liquids include:  water,  __X__2.  On the morning of surgery brush your teeth with toothpaste and water, you                may rinse your mouth with mouthwash if you wish.  Do not swallow any toothpaste of mouthwash.     _X__ 3.  No Alcohol for 24 hours before or after surgery.   _X__ 4.  Do Not Smoke or use e-cigarettes For 24 Hours Prior to Your Surgery.                 Do not use any chewable tobacco products for at least 6 hours prior to                 surgery.  ____  5.  Bring all medications with you on the day of surgery if instructed.   ____  6.  Notify your doctor if there is any change in your medical condition      (cold, fever, infections).     Do not wear jewelry, make-up, hairpins, clips or nail polish. Do not wear lotions, powders, or perfumes. You may wear deodorant. Do not shave 48 hours prior to surgery. Men may shave face and neck. Do not bring valuables to the hospital.    [facility] is not responsible for any belongings or valuables.  Contacts, dentures or bridgework may not be worn into surgery. Leave your suitcase in the car. After surgery it may be brought to your room. For patients admitted to the hospital, discharge time is  determined by your treatment team.   Patients discharged the day of surgery will not be allowed to drive home.   Please read over the following fact sheets that you were given:    __x__ Take these medicines the morning of surgery with A SIP OF WATER:    1. carvedilol (COREG) 12.5 MG tablet  2.   3.   4.  5.  6.  ____ Fleet Enema (as directed)   ____ Use CHG Soap as directed  ____ Use inhalers on the day of surgery  _x___ Stop metformin 2 days prior to surgery Last dose on 5/29    ____ Take 1/2 of usual insulin dose the night before surgery. No insulin the morning          of surgery.   __x__ Stopped aspirin yesterday  ____ Stop Anti-inflammatories  on    _x___ Stop supplements until after surgery.  vitamin C (ASCORBIC ACID) 500 MG tablet  __x__ Bring C-Pap to the hospital.

## 2018-12-06 LAB — URINE CULTURE: Culture: NO GROWTH

## 2018-12-06 LAB — NOVEL CORONAVIRUS, NAA (HOSP ORDER, SEND-OUT TO REF LAB; TAT 18-24 HRS): SARS-CoV-2, NAA: NOT DETECTED

## 2018-12-07 ENCOUNTER — Encounter: Payer: Self-pay | Admitting: Anesthesiology

## 2018-12-09 ENCOUNTER — Ambulatory Visit: Payer: Medicare Other

## 2018-12-09 ENCOUNTER — Ambulatory Visit: Payer: Medicare Other | Admitting: Anesthesiology

## 2018-12-09 ENCOUNTER — Ambulatory Visit
Admission: RE | Admit: 2018-12-09 | Discharge: 2018-12-09 | Disposition: A | Discharge status: Home / Self Care | Payer: Medicare Other | Attending: Urology | Admitting: Urology

## 2018-12-09 ENCOUNTER — Other Ambulatory Visit: Payer: Self-pay | Admitting: Urology

## 2018-12-09 ENCOUNTER — Encounter: Payer: Self-pay | Admitting: *Deleted

## 2018-12-09 ENCOUNTER — Other Ambulatory Visit: Payer: Self-pay

## 2018-12-09 ENCOUNTER — Encounter
Admission: RE | Disposition: A | Discharge status: Home / Self Care | Payer: Self-pay | Source: Home / Self Care | Attending: Urology

## 2018-12-09 ENCOUNTER — Telehealth: Payer: Self-pay

## 2018-12-09 DIAGNOSIS — C642 Malignant neoplasm of left kidney, except renal pelvis: Secondary | ICD-10-CM

## 2018-12-09 DIAGNOSIS — C652 Malignant neoplasm of left renal pelvis: Secondary | ICD-10-CM | POA: Insufficient documentation

## 2018-12-09 DIAGNOSIS — G4730 Sleep apnea, unspecified: Secondary | ICD-10-CM | POA: Insufficient documentation

## 2018-12-09 DIAGNOSIS — C679 Malignant neoplasm of bladder, unspecified: Secondary | ICD-10-CM | POA: Diagnosis not present

## 2018-12-09 DIAGNOSIS — J45909 Unspecified asthma, uncomplicated: Secondary | ICD-10-CM | POA: Insufficient documentation

## 2018-12-09 DIAGNOSIS — M1990 Unspecified osteoarthritis, unspecified site: Secondary | ICD-10-CM | POA: Diagnosis not present

## 2018-12-09 DIAGNOSIS — I252 Old myocardial infarction: Secondary | ICD-10-CM | POA: Insufficient documentation

## 2018-12-09 DIAGNOSIS — F419 Anxiety disorder, unspecified: Secondary | ICD-10-CM | POA: Diagnosis not present

## 2018-12-09 DIAGNOSIS — Z955 Presence of coronary angioplasty implant and graft: Secondary | ICD-10-CM | POA: Diagnosis not present

## 2018-12-09 DIAGNOSIS — C775 Secondary and unspecified malignant neoplasm of intrapelvic lymph nodes: Secondary | ICD-10-CM | POA: Diagnosis not present

## 2018-12-09 DIAGNOSIS — E119 Type 2 diabetes mellitus without complications: Secondary | ICD-10-CM | POA: Diagnosis not present

## 2018-12-09 DIAGNOSIS — I10 Essential (primary) hypertension: Secondary | ICD-10-CM | POA: Insufficient documentation

## 2018-12-09 DIAGNOSIS — F329 Major depressive disorder, single episode, unspecified: Secondary | ICD-10-CM | POA: Insufficient documentation

## 2018-12-09 DIAGNOSIS — Z419 Encounter for procedure for purposes other than remedying health state, unspecified: Secondary | ICD-10-CM

## 2018-12-09 DIAGNOSIS — I2510 Atherosclerotic heart disease of native coronary artery without angina pectoris: Secondary | ICD-10-CM | POA: Insufficient documentation

## 2018-12-09 DIAGNOSIS — E039 Hypothyroidism, unspecified: Secondary | ICD-10-CM | POA: Diagnosis not present

## 2018-12-09 DIAGNOSIS — Z7982 Long term (current) use of aspirin: Secondary | ICD-10-CM | POA: Insufficient documentation

## 2018-12-09 DIAGNOSIS — J849 Interstitial pulmonary disease, unspecified: Secondary | ICD-10-CM | POA: Diagnosis not present

## 2018-12-09 HISTORY — PX: CYSTOSCOPY WITH URETEROSCOPY: SHX5123

## 2018-12-09 HISTORY — PX: CYSTOSCOPY WITH STENT PLACEMENT: SHX5790

## 2018-12-09 HISTORY — PX: CYSTOSCOPY W/ RETROGRADES: SHX1426

## 2018-12-09 HISTORY — PX: CYSTOSCOPY WITH BIOPSY: SHX5122

## 2018-12-09 LAB — GLUCOSE, CAPILLARY: Glucose-Capillary: 154 mg/dL — ABNORMAL HIGH (ref 70–99)

## 2018-12-09 SURGERY — CYSTOSCOPY, WITH BIOPSY
Anesthesia: General | Site: Ureter | Laterality: Left

## 2018-12-09 MED ORDER — ONDANSETRON HCL 4 MG/2ML IJ SOLN
INTRAMUSCULAR | Status: DC | PRN
  Administered 2018-12-09: 4 mg via INTRAVENOUS

## 2018-12-09 MED ORDER — FENTANYL CITRATE (PF) 100 MCG/2ML IJ SOLN
INTRAMUSCULAR | Status: DC | PRN
  Administered 2018-12-09 (×2): 50 ug via INTRAVENOUS

## 2018-12-09 MED ORDER — SODIUM CHLORIDE 0.9 % IV SOLN
INTRAVENOUS | Status: DC
  Administered 2018-12-09: 08:00:00 via INTRAVENOUS

## 2018-12-09 MED ORDER — ROCURONIUM BROMIDE 50 MG/5ML IV SOLN
INTRAVENOUS | Status: AC
  Filled 2018-12-09: qty 1

## 2018-12-09 MED ORDER — SUCCINYLCHOLINE CHLORIDE 20 MG/ML IJ SOLN
INTRAMUSCULAR | Status: DC | PRN
  Administered 2018-12-09: 100 mg via INTRAVENOUS

## 2018-12-09 MED ORDER — SUCCINYLCHOLINE CHLORIDE 20 MG/ML IJ SOLN
INTRAMUSCULAR | Status: AC
  Filled 2018-12-09: qty 1

## 2018-12-09 MED ORDER — LACTATED RINGERS IV SOLN
INTRAVENOUS | Status: DC | PRN
  Administered 2018-12-09: 09:00:00 via INTRAVENOUS

## 2018-12-09 MED ORDER — FENTANYL CITRATE (PF) 100 MCG/2ML IJ SOLN: 25.0000 ug | INTRAMUSCULAR | Status: DC | PRN

## 2018-12-09 MED ORDER — SUGAMMADEX SODIUM 500 MG/5ML IV SOLN
INTRAVENOUS | Status: AC
  Filled 2018-12-09: qty 5

## 2018-12-09 MED ORDER — PROPOFOL 10 MG/ML IV BOLUS
INTRAVENOUS | Status: DC | PRN
  Administered 2018-12-09: 120 mg via INTRAVENOUS

## 2018-12-09 MED ORDER — FAMOTIDINE 20 MG PO TABS
20.0000 mg | ORAL_TABLET | Freq: Once | ORAL | Status: AC
  Administered 2018-12-09: 08:00:00 20 mg via ORAL

## 2018-12-09 MED ORDER — LIDOCAINE HCL (CARDIAC) PF 100 MG/5ML IV SOSY
PREFILLED_SYRINGE | INTRAVENOUS | Status: DC | PRN
  Administered 2018-12-09: 100 mg via INTRAVENOUS

## 2018-12-09 MED ORDER — LIDOCAINE HCL (PF) 2 % IJ SOLN
INTRAMUSCULAR | Status: AC
  Filled 2018-12-09: qty 10

## 2018-12-09 MED ORDER — CIPROFLOXACIN IN D5W 400 MG/200ML IV SOLN
400.0000 mg | Freq: Once | INTRAVENOUS | Status: AC
  Administered 2018-12-09: 400 mg via INTRAVENOUS

## 2018-12-09 MED ORDER — FAMOTIDINE 20 MG PO TABS
ORAL_TABLET | ORAL | Status: AC
  Administered 2018-12-09: 20 mg via ORAL
  Filled 2018-12-09: qty 1

## 2018-12-09 MED ORDER — CIPROFLOXACIN IN D5W 400 MG/200ML IV SOLN
INTRAVENOUS | Status: AC
  Filled 2018-12-09: qty 200

## 2018-12-09 MED ORDER — ONDANSETRON HCL 4 MG/2ML IJ SOLN: 4.0000 mg | Freq: Once | INTRAMUSCULAR | Status: DC | PRN

## 2018-12-09 MED ORDER — ONDANSETRON HCL 4 MG/2ML IJ SOLN
INTRAMUSCULAR | Status: AC
  Filled 2018-12-09: qty 2

## 2018-12-09 MED ORDER — PROPOFOL 10 MG/ML IV BOLUS
INTRAVENOUS | Status: AC
  Filled 2018-12-09: qty 20

## 2018-12-09 MED ORDER — IOHEXOL 180 MG/ML  SOLN
INTRAMUSCULAR | Status: DC | PRN
  Administered 2018-12-09: 20 mL

## 2018-12-09 MED ORDER — ROCURONIUM BROMIDE 100 MG/10ML IV SOLN
INTRAVENOUS | Status: DC | PRN
  Administered 2018-12-09: 10 mg via INTRAVENOUS
  Administered 2018-12-09: 20 mg via INTRAVENOUS

## 2018-12-09 MED ORDER — SUGAMMADEX SODIUM 200 MG/2ML IV SOLN
INTRAVENOUS | Status: DC | PRN
  Administered 2018-12-09: 120 mg via INTRAVENOUS

## 2018-12-09 MED ORDER — HYDROCODONE-ACETAMINOPHEN 5-325 MG PO TABS: 1.0000 | ORAL_TABLET | Freq: Four times a day (QID) | ORAL | 0 refills | Status: DC | PRN

## 2018-12-09 MED ORDER — FENTANYL CITRATE (PF) 100 MCG/2ML IJ SOLN
INTRAMUSCULAR | Status: AC
  Filled 2018-12-09: qty 2

## 2018-12-09 MED ORDER — TAMSULOSIN HCL 0.4 MG PO CAPS: 0.4000 mg | ORAL_CAPSULE | Freq: Every day | ORAL | 0 refills | Status: DC

## 2018-12-09 MED ORDER — OXYBUTYNIN CHLORIDE 5 MG PO TABS: 5.0000 mg | ORAL_TABLET | Freq: Three times a day (TID) | ORAL | 0 refills | Status: DC | PRN

## 2018-12-09 SURGICAL SUPPLY — 38 items
BAG DRAIN CYSTO-URO LG1000N (MISCELLANEOUS) ×4 IMPLANT
BASKET ZERO TIP 1.9FR (BASKET) IMPLANT
BRUSH SCRUB EZ  4% CHG (MISCELLANEOUS)
BRUSH SCRUB EZ 1% IODOPHOR (MISCELLANEOUS) ×4 IMPLANT
BRUSH SCRUB EZ 4% CHG (MISCELLANEOUS) IMPLANT
CATH URETL 5X70 OPEN END (CATHETERS) ×4 IMPLANT
CNTNR SPEC 2.5X3XGRAD LEK (MISCELLANEOUS) ×2
CONT SPEC 4OZ STER OR WHT (MISCELLANEOUS) ×2
CONTAINER SPEC 2.5X3XGRAD LEK (MISCELLANEOUS) ×2 IMPLANT
COVER WAND RF STERILE (DRAPES) IMPLANT
DRAPE UTILITY 15X26 TOWEL STRL (DRAPES) ×4 IMPLANT
DRSG TELFA 4X3 1S NADH ST (GAUZE/BANDAGES/DRESSINGS) ×4 IMPLANT
ELECT REM PT RETURN 9FT ADLT (ELECTROSURGICAL) ×4
ELECTRODE REM PT RTRN 9FT ADLT (ELECTROSURGICAL) ×2 IMPLANT
FIBER LASER LITHO 273 (Laser) IMPLANT
FORCEPS BIOP PIRANHA Y (CUTTING FORCEPS) ×8 IMPLANT
GLOVE BIO SURGEON STRL SZ 6.5 (GLOVE) ×6 IMPLANT
GLOVE BIO SURGEONS STRL SZ 6.5 (GLOVE) ×2
GOWN STRL REUS W/ TWL LRG LVL3 (GOWN DISPOSABLE) ×4 IMPLANT
GOWN STRL REUS W/TWL LRG LVL3 (GOWN DISPOSABLE) ×4
GUIDEWIRE GREEN .038 145CM (MISCELLANEOUS) IMPLANT
GUIDEWIRE STR DUAL SENSOR (WIRE) ×4 IMPLANT
GUIDEWIRE SUPER STIFF (WIRE) ×4 IMPLANT
INFUSOR MANOMETER BAG 3000ML (MISCELLANEOUS) ×4 IMPLANT
INTRODUCER DILATOR DOUBLE (INTRODUCER) ×4 IMPLANT
KIT TURNOVER CYSTO (KITS) ×4 IMPLANT
NDL SAFETY ECLIPSE 18X1.5 (NEEDLE) IMPLANT
NEEDLE HYPO 18GX1.5 SHARP (NEEDLE)
PACK CYSTO AR (MISCELLANEOUS) ×4 IMPLANT
SET CYSTO W/LG BORE CLAMP LF (SET/KITS/TRAYS/PACK) ×4 IMPLANT
SHEATH URETERAL 12FRX35CM (MISCELLANEOUS) ×4 IMPLANT
SOL .9 NS 3000ML IRR  AL (IV SOLUTION) ×2
SOL .9 NS 3000ML IRR UROMATIC (IV SOLUTION) ×2 IMPLANT
STENT URET 6FRX24 CONTOUR (STENTS) IMPLANT
STENT URET 6FRX26 CONTOUR (STENTS) ×4 IMPLANT
SURGILUBE 2OZ TUBE FLIPTOP (MISCELLANEOUS) ×4 IMPLANT
WATER STERILE IRR 1000ML POUR (IV SOLUTION) ×4 IMPLANT
WATER STERILE IRR 3000ML UROMA (IV SOLUTION) ×4 IMPLANT

## 2018-12-09 NOTE — Anesthesia Post-op Follow-up Note (Signed)
Anesthesia QCDR form completed.        

## 2018-12-09 NOTE — Op Note (Signed)
Date of procedure: 12/09/18  Preoperative diagnosis:  1. Left upper tract urothelial carcinoma  Postoperative diagnosis:  1. Same as above  Procedure: 1. Left ureteroscopy 2. Left retrograde pyelogram 3. Left renal pelvic biopsy with lavage cytology 4. Left ureteral stent placement  Surgeon: [name], MD  Anesthesia: General  Complications: None  Intraoperative findings: Tight proximal mid and proximal ureter, ultimately able to advance flexible ureteroscope to the level of tumor and obtain multiple macroscopic biopsies.  Stent placed on tether.  EBL: Minimal  Specimens: Left renal pelvic biopsies times multiple, left renal pelvic cytology  Drains: 6 x 26 French double-J ureteral stent on left, string left in place  Indication: Patrick Mcgee is a 73 y.o. patient with left upper tract urothelial carcinoma, metastatic who requires additional tissue for genetic testing to help determine his treatment plan.  After reviewing the management options for treatment, he elected to proceed with the above surgical procedure(s). We have discussed the potential benefits and risks of the procedure, side effects of the proposed treatment, the likelihood of the patient achieving the goals of the procedure, and any potential problems that might occur during the procedure or recuperation. Informed consent has been obtained.  Description of procedure:  The patient was taken to the operating room and general anesthesia was induced.  The patient was placed in the dorsal lithotomy position, prepped and draped in the usual sterile fashion, and preoperative antibiotics were administered. A preoperative time-out was performed.   A 21 French scope was advanced per urethra into the bladder.  Attention was turned to left ureteral orifice which was cannulated using a 5 [place] open-ended ureteral catheter.  Gentle retrograde pyelogram was performed on the side which showed a fairly narrow ureter without  obvious ureteral strictures or filling defects.  Within the left renal pelvis, there was a fairly significant size of filling defect in the mid and left lower pole consistent with known tumor.  A Super Stiff wire was then placed up to level the kidney and a dual-lumen introducer was used to introduce a second sensor wire which was excluded as a safety wire.  It then attempted to advance an access sheath but was not successful in the first attempt.  I attempted to advance the inner cannula only which did go quite easily but still has some difficulty introducing it beyond the distal ureter with both the inner and outer sheath.  I also had some difficulty advancing the scope only over the Super Stiff wire thus to ensure that there were no stricture or tumors obstructing, I was able to advance a 5 French semirigid ureteroscope all the way up to the proximal ureter with reasonably little difficulty and no obvious tumor was encountered within the lumen of the ureter.  And then backed this out.  Ultimately I was able to advance a 12/14 [place] Cook access sheath only to the mid ureter but this was sufficient.  A flexible dual-lumen 8 French digital ureteroscope was then advanced over the Super Stiff wire that through the access sheath up into the renal pelvis where the tumor was encountered.  It had a high-grade nodular type appearance and appeared to occupy much of the entire lower pole calyx and the overall anatomy was somewhat distorted.  I then lavaged the kidney to obtain a urine cytology.  Ultimately, I was able to reverse back and forth from the renal pelvis and take small biopsies first with a Gemini basket to loosen the tumor and ultimately take  larger pieces which are fairly decent size using a Piranha forceps.  Finally, final retrograde pyelogram was performed that showed no extravasation or injury.  I did scope the access sheath out and the safety wire was backloaded over rigid cystoscope.  A 6 x 26 French  double-J ureteral stent was advanced over the safety wire up to level of the renal pelvis.  This a partially drawn till full coils noted both within the renal pelvis and then ultimately fully withdrawn a full coils noted within the bladder.  The tether was left in place.  The bladder was then drained.  The string was affixed to the patient's glans using Mastisol and Tegaderm.  The foreskin was replaced back in its normal anatomic position.  The patient was then clean and dry, repositioned supine position, reversed anesthesia, and taken to the PACU in stable condition.  Plan: The pathology lab was called to ensure the proper study was ordered.  I will have the patient keep his ureteral stent in place until we can confirm that there is enough tissue and no need to return for second attempt with additional passive dilation.  Discussed the plan with his wife he is agreeable.  [name], M.D.

## 2018-12-09 NOTE — Anesthesia Postprocedure Evaluation (Signed)
Anesthesia Post Note  Patient: Patrick Mcgee  Procedure(s) Performed: CYSTOSCOPY WITH URETERAL/RENAL PELVIC BIOPSY (Left Ureter) CYSTOSCOPY WITH RETROGRADE PYELOGRAM (Left Ureter) CYSTOSCOPY WITH URETEROSCOPY (Left Ureter) CYSTOSCOPY WITH STENT PLACEMENT (Left Ureter)  Patient location during evaluation: PACU Anesthesia Type: General Level of consciousness: awake and alert Pain management: pain level controlled Vital Signs Assessment: post-procedure vital signs reviewed and stable Respiratory status: spontaneous breathing, nonlabored ventilation, respiratory function stable and patient connected to nasal cannula oxygen Cardiovascular status: blood pressure returned to baseline and stable Postop Assessment: no apparent nausea or vomiting Anesthetic complications: no     Last Vitals:  Vitals:   12/09/18 1126 12/09/18 1147  BP: 140/65 (!) 155/69  Pulse: (!) 55 (!) 56  Resp: 18 16  Temp: (!) 36.4 C   SpO2: 93% 97%    Last Pain:  Vitals:   12/09/18 1147  TempSrc:   PainSc: 0-No pain                 Orian Figueira S

## 2018-12-09 NOTE — Interval H&P Note (Signed)
History and Physical Interval Note:  12/09/2018 8:25 AM  Patrick Mcgee  has presented today for surgery, with the diagnosis of CA.  The various methods of treatment have been discussed with the patient and family. After consideration of risks, benefits and other options for treatment, the patient has consented to  Procedure(s): CYSTOSCOPY WITH URETERAL/RENAL PELVIC BIOPSY (Left) CYSTOSCOPY/URETEROSCOPY/HOLMIUM LASER/STENT PLACEMENT (Left) CYSTOSCOPY WITH RETROGRADE PYELOGRAM (Left) as a surgical intervention.  The patient's history has been reviewed, patient examined, no change in status, stable for surgery.  I have reviewed the patient's chart and labs.  Questions were answered to the patient's satisfaction.    RRR CTAB  [name]

## 2018-12-09 NOTE — Discharge Instructions (Addendum)
You have a ureteral stent in place.  This is a tube that extends from your kidney to your bladder.  This may cause urinary bleeding, burning with urination, and urinary frequency.  Please call our office or present to the ED if you develop fevers >101 or pain which is not able to be controlled with oral pain medications.  You may be given either Flomax and/ or ditropan to help with bladder spasms and stent pain in addition to pain medications.    Your stent is on a string.  Once you hear from our office to ensure that there is enough pathology specimen, you may pull the stent until its removed in entirety.  You may follow-up with Patrick Mcgee.  [facility] [address], [place], [place] [address] [phone]    AMBULATORY SURGERY  DISCHARGE INSTRUCTIONS   1) The drugs that you were given will stay in your system until tomorrow so for the next 24 hours you should not:  A) Drive an automobile B) Make any legal decisions C) Drink any alcoholic beverage   2) You may resume regular meals tomorrow.  Today it is better to start with liquids and gradually work up to solid foods.  You may eat anything you prefer, but it is better to start with liquids, then soup and crackers, and gradually work up to solid foods.   3) Please notify your doctor immediately if you have any unusual bleeding, trouble breathing, redness and pain at the surgery site, drainage, fever, or pain not relieved by medication.    4) Additional Instructions:        Please contact your physician with any problems or Same Day Surgery at [phone], Monday through Friday 6 am to 4 pm, or [place] [facility] number at [phone].

## 2018-12-09 NOTE — Anesthesia Procedure Notes (Signed)
Procedure Name: Intubation Date/Time: 12/09/2018 8:58 AM Performed by: Rona Ravens, CRNA Pre-anesthesia Checklist: Patient identified, Emergency Drugs available, Suction available and Patient being monitored Patient Re-evaluated:Patient Re-evaluated prior to induction Oxygen Delivery Method: Circle system utilized Preoxygenation: Pre-oxygenation with 100% oxygen Induction Type: IV induction and Rapid sequence Laryngoscope Size: Mac and 3 Grade View: Grade II Tube type: Oral Tube size: 7.0 mm Number of attempts: 1 Placement Confirmation: ETT inserted through vocal cords under direct vision,  positive ETCO2,  CO2 detector and breath sounds checked- equal and bilateral Secured at: 21 cm Tube secured with: Tape Dental Injury: Teeth and Oropharynx as per pre-operative assessment

## 2018-12-09 NOTE — Telephone Encounter (Signed)
Telephone call to schedule telehealth visit for patient.  Wife reports patient had biopsy done today.  Patient and wife in agreement with telehealth visit on Wednesday 12/11/18 at 3:00 PM.

## 2018-12-09 NOTE — Anesthesia Preprocedure Evaluation (Signed)
Anesthesia Evaluation  Patient identified by MRN, date of birth, ID band Patient awake    Reviewed: Allergy & Precautions, NPO status , Patient's Chart, lab work & pertinent test results, reviewed documented beta blocker date and time   Airway Mallampati: III  TM Distance: >3 FB     Dental  (+) Chipped   Pulmonary asthma , sleep apnea , former smoker,           Cardiovascular hypertension, Pt. on medications and Pt. on home beta blockers + CAD, + Past MI and + Cardiac Stents       Neuro/Psych PSYCHIATRIC DISORDERS Anxiety Depression    GI/Hepatic GERD  Controlled,  Endo/Other  diabetes, Type 2Hypothyroidism   Renal/GU Renal disease     Musculoskeletal  (+) Arthritis ,   Abdominal   Peds  Hematology   Anesthesia Other Findings EKG ok.  Reproductive/Obstetrics                             Anesthesia Physical Anesthesia Plan  ASA: III  Anesthesia Plan: General   Post-op Pain Management:    Induction: Intravenous  PONV Risk Score and Plan:   Airway Management Planned: Oral ETT  Additional Equipment:   Intra-op Plan:   Post-operative Plan:   Informed Consent: I have reviewed the patients History and Physical, chart, labs and discussed the procedure including the risks, benefits and alternatives for the proposed anesthesia with the patient or authorized representative who has indicated his/her understanding and acceptance.       Plan Discussed with: CRNA  Anesthesia Plan Comments:         Anesthesia Quick Evaluation

## 2018-12-09 NOTE — Transfer of Care (Signed)
Immediate Anesthesia Transfer of Care Note  Patient: Patrick Mcgee  Procedure(s) Performed: CYSTOSCOPY WITH URETERAL/RENAL PELVIC BIOPSY (Left Ureter) CYSTOSCOPY WITH RETROGRADE PYELOGRAM (Left Ureter) CYSTOSCOPY WITH URETEROSCOPY (Left Ureter) CYSTOSCOPY WITH STENT PLACEMENT (Left Ureter)  Patient Location: PACU  Anesthesia Type:General  Level of Consciousness: awake, alert  and oriented  Airway & Oxygen Therapy: Patient Spontanous Breathing  Post-op Assessment: Report given to RN and Post -op Vital signs reviewed and stable  Post vital signs: Reviewed and stable  Last Vitals:  Vitals Value Taken Time  BP 157/77 12/09/2018 10:40 AM  Temp 36.2 C 12/09/2018 10:39 AM  Pulse 55 12/09/2018 10:41 AM  Resp 0 12/09/2018 10:40 AM  SpO2 100 % 12/09/2018 10:41 AM  Vitals shown include unvalidated device data.  Last Pain:  Vitals:   12/09/18 1039  TempSrc:   PainSc: 0-No pain         Complications: No apparent anesthesia complications

## 2018-12-10 LAB — SURGICAL PATHOLOGY

## 2018-12-11 ENCOUNTER — Other Ambulatory Visit: Payer: Medicare Other

## 2018-12-11 ENCOUNTER — Telehealth: Payer: Self-pay | Admitting: Radiology

## 2018-12-11 ENCOUNTER — Other Ambulatory Visit: Payer: Self-pay | Admitting: *Deleted

## 2018-12-11 ENCOUNTER — Other Ambulatory Visit: Payer: Self-pay

## 2018-12-11 DIAGNOSIS — Z515 Encounter for palliative care: Secondary | ICD-10-CM

## 2018-12-11 DIAGNOSIS — R9389 Abnormal findings on diagnostic imaging of other specified body structures: Secondary | ICD-10-CM

## 2018-12-11 DIAGNOSIS — C689 Malignant neoplasm of urinary organ, unspecified: Secondary | ICD-10-CM

## 2018-12-11 NOTE — Progress Notes (Unsigned)
img 

## 2018-12-11 NOTE — Progress Notes (Signed)
COMMUNITY PALLIATIVE CARE SW NOTE  PATIENT NAME: Patrick Mcgee DOB: 1946-04-16 MRN: [id number]  PRIMARY CARE PROVIDER: Jerrol Banana., MD  RESPONSIBLE PARTY:  Acct ID - Guarantor Home Phone Work Phone Relationship Acct Type  [id number] CHICK, COUSINS [phone]  Self P/F     [place], [place], [place] [address]     PLAN OF CARE and INTERVENTIONS:             1. GOALS OF CARE/ ADVANCE CARE PLANNING:  Patient's goal is to continue with any treatment options. Discussion to continue, patient is not interested in chemo if it will make him feel sick. Patient remains FULL CODE. 2. SOCIAL/EMOTIONAL/SPIRITUAL ASSESSMENT/ INTERVENTIONS:  Virgie, RN and SW met with patient and wife, [name] via Alligator. Patient had a biopsy on Monday and results were inconclusive. Patient and wife expressed concerns for this and the need for repeat biopsy. Patient appeared tired, not as cheerful as previous visit. Patient is having pain and blood in urine. Patient noted that he has also lost weight, and does not have much of an appetite. Patient has felt weaker and not been able to exercise as much as he was before. Patient was nauseated today, but does have medications for this.  3. PATIENT/CAREGIVER EDUCATION/ COPING:  In discussion of options, patient and wife express concerns for patient's biopsy and plan of care. Patient and wife do their own research on medical treatments and mediations to remain informed. Validated patient and wife concerns and encourage ongoing discussions with MD. 4. PERSONAL EMERGENCY PLAN:  Patient and family will call 9-1-1 for emergencies. Due to COVID-19 crisis, patient verbalized that he is remaining at home, as able and washing hands frequently. 5. COMMUNITY RESOURCES COORDINATION/ HEALTH CARE NAVIGATION:  Patient and [name] manage patient's appointments. Patient has a PCP visit next week. 6. FINANCIAL/LEGAL CONCERNS/INTERVENTIONS:  None.     SOCIAL HX:  Social History    Tobacco Use  . Smoking status: Former Smoker    Types: Cigars    Last attempt to quit: 10/09/1978    Years since quitting: 40.2  . Smokeless tobacco: Former Systems developer    Types: Chew    Quit date: 12/04/1988  . Tobacco comment: on occasion  Substance Use Topics  . Alcohol use: Not Currently    CODE STATUS:   Code Status: Prior  ADVANCED DIRECTIVES: N MOST FORM COMPLETE:  No. HOSPICE EDUCATION PROVIDED: None.  PPS: Patient is independent of ADLs.   Due to the COVID-19 crisis, this visit was done viaZoomfrom my office and it was initiated and consent by this patient and/or family.This was a scheduled visit.  I spent40mnutes with patient/family, from 3:00-3:45pproviding education, support and consultation.    WMargaretmary Lombard LCSW

## 2018-12-11 NOTE — Telephone Encounter (Signed)
Spoke with patient per Dr. Erlene Quan patient was notified that it is normal to have blood in the urine with a stent in place and that she spoke with Dr. Hedwig Morton and we may need more tissue. Patient was instructed to leave stent in until we confirm if we need more tissue or not, patient verbalized understanding

## 2018-12-11 NOTE — Progress Notes (Signed)
PATIENT NAME: Patrick Mcgee DOB: 07-20-1945 MRN: [id number]  PRIMARY CARE PROVIDER: Jerrol Banana., MD  RESPONSIBLE PARTY:  Acct ID - Guarantor Home Phone Work Phone Relationship Acct Type  [id number] STRYDER, POITRA [phone]  Self P/F     [place], [place], [place] [address]    PLAN OF CARE and INTERVENTIONS:               1.  GOALS OF CARE/ ADVANCE CARE PLANNING:  Patient wants to remain in home with wife.                2.  PATIENT/CAREGIVER EDUCATION:  Education on s/s of infection, education on disease progression.                3.  DISEASE STATUS: Patient is a 73 year old patient who resides in home with wife.  RN and SW met with patient and wife for visit. Due to the COVID-19 crisis, this visit was done via telemedicine from my office and it was initiated and consent by this patient and or family. Patient had biopsy on lymph node on Monday.  Wife and patient report Dr Cherrie Gauze office called and reported biopsy results are inclusive as not enough tissue was obtained.  Patient and wife upset that patient will need to have biopsy repeated.  Patient reports he is having more pain and bleeding with this biopsy.  Patient states he is having burning and bloody drainage.  Patient reports he called MD's office and MD informed him this is normal and not to worry about bleeding and drainage.  Wife reports patient has pain meds in the home to take for pain.  Patient also reports feeling nauseated today.  Patient states he has meds for nausea but states nausea is not bad and he does not want to take.  Patient reports feeling weaker.  Patient to continue with immunotherapy until after biopsy results are obtained.  Patient states Dr. Janese Banks states he may have to resume chemo if immunotherapy is not a option.  Patient states he doesn't know if he wants to have chemo if he is going to stay sick.  Patient has not been exercising and staying in bed more. Support provided to patient and wife.  Patient  and wife encouraged to call with questions or concerns.               HISTORY OF PRESENT ILLNESS:    CODE STATUS: Full Code  ADVANCED DIRECTIVES: Y MOST FORM: No PPS: 50%   PHYSICAL EXAM:   VITALS: No vital signs taken Telehealth visit  LUNGS: Denies shortness of breath/cough CARDIAC:  EXTREMITIES: No edema SKIN: Skin color, texture, turgor normal. No rashes or lesions  NEURO: Alert and oriented x 4       Nilda Simmer, RN

## 2018-12-11 NOTE — Telephone Encounter (Signed)
Patient reports hematuria following renal pelvic biopsy performed 12/09/2018. He says it is increasing each day. Please call patient at [phone].

## 2018-12-12 ENCOUNTER — Encounter: Payer: Self-pay | Admitting: Oncology

## 2018-12-16 ENCOUNTER — Other Ambulatory Visit: Payer: Self-pay | Admitting: Student

## 2018-12-16 ENCOUNTER — Telehealth: Payer: Self-pay | Admitting: *Deleted

## 2018-12-16 NOTE — Telephone Encounter (Signed)
Called patient to speak to him about bx for tomorrow. He states that he is fine with the bx and [facility] 10 am procedure . Has to NPO after midnight tonight. Needs a driver because he will get meds to make him sleepy. His wife will take him he said. If he has any meds he has to take them take with sip of water. Also he had PCP appt 6/9 at around 1 pm and I called the office to see if it can be r/s and they were able to change the appt to 6/10 11:20 and the patient is ok with the appt being moved. He did state that since getting the stent and bx with dr Erlene Quan everytime he urinates there is blood in toilet. And he has back pain. Dr. Janese Banks sent a message to Dr. Erlene Quan and she says that it is normal when has stent in. When it gets out this will get better.  Dr. Erlene Quan states that patient is good to get the bx tom. Pt. Ok with proceeding.

## 2018-12-17 ENCOUNTER — Other Ambulatory Visit: Payer: Self-pay

## 2018-12-17 ENCOUNTER — Ambulatory Visit: Payer: Self-pay | Admitting: Family Medicine

## 2018-12-17 ENCOUNTER — Ambulatory Visit
Admission: RE | Admit: 2018-12-17 | Discharge: 2018-12-17 | Disposition: A | Discharge status: Home / Self Care | Payer: Medicare Other | Source: Ambulatory Visit | Attending: Oncology | Admitting: Oncology

## 2018-12-17 DIAGNOSIS — R591 Generalized enlarged lymph nodes: Secondary | ICD-10-CM | POA: Insufficient documentation

## 2018-12-17 DIAGNOSIS — Z8551 Personal history of malignant neoplasm of bladder: Secondary | ICD-10-CM | POA: Insufficient documentation

## 2018-12-17 DIAGNOSIS — R9389 Abnormal findings on diagnostic imaging of other specified body structures: Secondary | ICD-10-CM | POA: Insufficient documentation

## 2018-12-17 DIAGNOSIS — C772 Secondary and unspecified malignant neoplasm of intra-abdominal lymph nodes: Secondary | ICD-10-CM | POA: Diagnosis not present

## 2018-12-17 DIAGNOSIS — Z7984 Long term (current) use of oral hypoglycemic drugs: Secondary | ICD-10-CM | POA: Insufficient documentation

## 2018-12-17 DIAGNOSIS — C679 Malignant neoplasm of bladder, unspecified: Secondary | ICD-10-CM | POA: Insufficient documentation

## 2018-12-17 DIAGNOSIS — Z87891 Personal history of nicotine dependence: Secondary | ICD-10-CM | POA: Insufficient documentation

## 2018-12-17 DIAGNOSIS — Z7901 Long term (current) use of anticoagulants: Secondary | ICD-10-CM | POA: Insufficient documentation

## 2018-12-17 DIAGNOSIS — Z79899 Other long term (current) drug therapy: Secondary | ICD-10-CM | POA: Insufficient documentation

## 2018-12-17 DIAGNOSIS — R590 Localized enlarged lymph nodes: Secondary | ICD-10-CM | POA: Diagnosis not present

## 2018-12-17 DIAGNOSIS — C689 Malignant neoplasm of urinary organ, unspecified: Secondary | ICD-10-CM | POA: Diagnosis not present

## 2018-12-17 DIAGNOSIS — F329 Major depressive disorder, single episode, unspecified: Secondary | ICD-10-CM | POA: Insufficient documentation

## 2018-12-17 DIAGNOSIS — I10 Essential (primary) hypertension: Secondary | ICD-10-CM | POA: Diagnosis not present

## 2018-12-17 DIAGNOSIS — Z7982 Long term (current) use of aspirin: Secondary | ICD-10-CM | POA: Diagnosis not present

## 2018-12-17 DIAGNOSIS — G4730 Sleep apnea, unspecified: Secondary | ICD-10-CM | POA: Diagnosis not present

## 2018-12-17 DIAGNOSIS — E119 Type 2 diabetes mellitus without complications: Secondary | ICD-10-CM | POA: Insufficient documentation

## 2018-12-17 DIAGNOSIS — E785 Hyperlipidemia, unspecified: Secondary | ICD-10-CM | POA: Diagnosis not present

## 2018-12-17 DIAGNOSIS — I252 Old myocardial infarction: Secondary | ICD-10-CM | POA: Insufficient documentation

## 2018-12-17 LAB — CBC
HCT: 33.5 % — ABNORMAL LOW (ref 39.0–52.0)
Hemoglobin: 11.1 g/dL — ABNORMAL LOW (ref 13.0–17.0)
MCH: 29.1 pg (ref 26.0–34.0)
MCHC: 33.1 g/dL (ref 30.0–36.0)
MCV: 87.9 fL (ref 80.0–100.0)
Platelets: 240 10*3/uL (ref 150–400)
RBC: 3.81 MIL/uL — ABNORMAL LOW (ref 4.22–5.81)
RDW: 14 % (ref 11.5–15.5)
WBC: 8.4 10*3/uL (ref 4.0–10.5)
nRBC: 0 % (ref 0.0–0.2)

## 2018-12-17 LAB — PROTIME-INR
INR: 1.1 (ref 0.8–1.2)
Prothrombin Time: 14.4 seconds (ref 11.4–15.2)

## 2018-12-17 LAB — GLUCOSE, CAPILLARY: Glucose-Capillary: 172 mg/dL — ABNORMAL HIGH (ref 70–99)

## 2018-12-17 MED ORDER — SODIUM CHLORIDE 0.9 % IV SOLN
INTRAVENOUS | Status: DC
  Administered 2018-12-17: 10:00:00 via INTRAVENOUS

## 2018-12-17 MED ORDER — FENTANYL CITRATE (PF) 100 MCG/2ML IJ SOLN
INTRAMUSCULAR | Status: AC | PRN
  Administered 2018-12-17: 50 ug via INTRAVENOUS

## 2018-12-17 MED ORDER — FENTANYL CITRATE (PF) 100 MCG/2ML IJ SOLN
INTRAMUSCULAR | Status: AC | PRN
  Administered 2018-12-17: 25 ug via INTRAVENOUS
  Administered 2018-12-17: 50 ug via INTRAVENOUS
  Administered 2018-12-17: 25 ug via INTRAVENOUS

## 2018-12-17 MED ORDER — FENTANYL CITRATE (PF) 100 MCG/2ML IJ SOLN
INTRAMUSCULAR | Status: AC
  Filled 2018-12-17: qty 2

## 2018-12-17 MED ORDER — MIDAZOLAM HCL 5 MG/5ML IJ SOLN
INTRAMUSCULAR | Status: AC
  Filled 2018-12-17: qty 5

## 2018-12-17 MED ORDER — MIDAZOLAM HCL 5 MG/5ML IJ SOLN
INTRAMUSCULAR | Status: AC | PRN
  Administered 2018-12-17 (×2): 1 mg via INTRAVENOUS

## 2018-12-17 MED ORDER — HEPARIN SOD (PORK) LOCK FLUSH 100 UNIT/ML IV SOLN
INTRAVENOUS | Status: AC
  Filled 2018-12-17: qty 5

## 2018-12-17 MED ORDER — HYDROCODONE-ACETAMINOPHEN 5-325 MG PO TABS
1.0000 | ORAL_TABLET | ORAL | Status: DC | PRN
  Filled 2018-12-17: qty 2

## 2018-12-17 MED ORDER — FENTANYL CITRATE (PF) 100 MCG/2ML IJ SOLN
INTRAMUSCULAR | Status: AC
  Filled 2018-12-17: qty 4

## 2018-12-17 NOTE — Progress Notes (Signed)
Patient tolerated CT biopsy of par aortic lymph node well.  To SR-ate boxed lunch.  Dr. Vernard Gambles came to talk with pt.-pathology department called and wants more tissue.  Patient taken back to CT for 2nd procedure-only fentanyl given (no moderate sedation) d/t to patient no longer NPO.  Tolerated well.  [name]-patient's wife called 3 times to keep informed of patient's status/plan.

## 2018-12-17 NOTE — Procedures (Signed)
  Procedure: CT core L paraaortic LAN   EBL:   minimal Complications:  none immediate  See full dictation in [facility].  Dillard Cannon MD Main # [phone] Pager  [phone]

## 2018-12-17 NOTE — Consult Note (Signed)
Chief Complaint: Patient was seen in consultation today for biopsy at the request of [place] C  Referring Physician(s): Rao,Archana C  Supervising Physician: Arne Cleveland  Patient Status: [facility] - Out-pt  History of Present Illness: Patrick Mcgee is a 73 y.o. male history of biopsy-proven urothelial carcinoma.  Left para-aortic retroperitoneal adenopathy identified.  Additional tissue need for molecular testing.  Percutaneous biopsy requested.  Past Medical History:  Diagnosis Date   Acid reflux    Anxiety    Depression    Diabetes mellitus without complication ([place])    Hyperlipidemia    Hypertension    MRSA (methicillin resistant Staphylococcus aureus) infection 1992   history   Myocardial infarction ([place]) 1995   Sleep apnea    CPAP   Urothelial cancer ([place]) 11/2017   Left Urothelial mass, chemo tx's    Past Surgical History:  Procedure Laterality Date   CATARACT EXTRACTION Left    COLONOSCOPY  2010   [facility]   COLONOSCOPY WITH PROPOFOL N/A 10/04/2016   Procedure: COLONOSCOPY WITH PROPOFOL;  Surgeon: [name], MD;  Location: [facility];  Service: Endoscopy;  Laterality: N/A;   [place], 2000, 2001, 2014   CYSTOSCOPY W/ RETROGRADES Bilateral 12/10/2017   Procedure: CYSTOSCOPY WITH RETROGRADE PYELOGRAM;  Surgeon: [name], MD;  Location: [facility];  Service: Urology;  Laterality: Bilateral;   CYSTOSCOPY W/ RETROGRADES Left 12/09/2018   Procedure: CYSTOSCOPY WITH RETROGRADE PYELOGRAM;  Surgeon: [name], MD;  Location: [facility];  Service: Urology;  Laterality: Left;   CYSTOSCOPY WITH BIOPSY Left 12/09/2018   Procedure: CYSTOSCOPY WITH URETERAL/RENAL PELVIC BIOPSY;  Surgeon: [name], MD;  Location: [facility];  Service: Urology;  Laterality: Left;   CYSTOSCOPY WITH STENT PLACEMENT Left 12/10/2017   Procedure: CYSTOSCOPY WITH STENT PLACEMENT;  Surgeon: [name], MD;   Location: [facility];  Service: Urology;  Laterality: Left;   CYSTOSCOPY WITH STENT PLACEMENT Left 12/09/2018   Procedure: CYSTOSCOPY WITH STENT PLACEMENT;  Surgeon: [name], MD;  Location: [facility];  Service: Urology;  Laterality: Left;   CYSTOSCOPY WITH URETEROSCOPY Left 12/09/2018   Procedure: CYSTOSCOPY WITH URETEROSCOPY;  Surgeon: [name], MD;  Location: [facility];  Service: Urology;  Laterality: Left;   EYE SURGERY Bilateral    cataract   INGUINAL HERNIA REPAIR Right 08/09/2015   Procedure: HERNIA REPAIR INGUINAL ADULT;  Surgeon: [name], MD;  Location: [facility];  Service: General;  Laterality: Right;   NASAL SINUS SURGERY     nuclear stress test     PORTA CATH INSERTION N/A 12/19/2017   Procedure: PORTA CATH INSERTION;  Surgeon: [name], MD;  Location: [facility];  Service: Cardiovascular;  Laterality: N/A;   URETERAL BIOPSY Left 12/10/2017   Procedure: URETERAL & renal PELVIS BIOPSY;  Surgeon: [name], MD;  Location: [facility];  Service: Urology;  Laterality: Left;   URETEROSCOPY Left 12/10/2017   Procedure: URETEROSCOPY;  Surgeon: [name], MD;  Location: [facility];  Service: Urology;  Laterality: Left;    Allergies: Sulfa antibiotics; Ace inhibitors; Invokana [canagliflozin]; and Penicillins  Medications: Prior to Admission medications   Medication Sig Start Date End Date Taking? Authorizing Provider  atorvastatin (LIPITOR) 40 MG tablet Take 1 tablet (40 mg total) by mouth at bedtime. 03/24/18  Yes Jerrol Banana., MD  carvedilol (COREG) 12.5 MG tablet TAKE 1 TABLET BY MOUTH TWICE DAILY Patient taking differently: Take 12.5 mg by mouth 2 (two) times  a day.  06/24/18  Yes Jerrol Banana., MD  empagliflozin (JARDIANCE) 10 MG TABS tablet Take 10 mg by mouth daily. Patient taking differently: Take 10 mg by mouth daily as needed (if blood sugar is over 200).  04/30/18  Yes Jerrol Banana., MD  fluticasone  [facility]) 50 MCG/ACT nasal spray Use 2 spray(s) in each nostril once daily Patient taking differently: Place 2 sprays into both nostrils daily.  09/25/18  Yes Jerrol Banana., MD  Ivermectin (SOOLANTRA) 1 % CREA Apply 1 application topically daily as needed (rosacea). To face   Yes [provider]  lidocaine-prilocaine (EMLA) cream Apply 1 application topically as needed. Patient taking differently: Apply 1 application topically as needed (port access).  12/20/17  Yes Burns, Wandra Feinstein, NP  losartan (COZAAR) 100 MG tablet TAKE 1 TABLET BY MOUTH ONCE DAILY Patient taking differently: Take 100 mg by mouth at bedtime.  06/10/18  Yes Jerrol Banana., MD  magnesium hydroxide (MILK OF MAGNESIA) 400 MG/5ML suspension Take 15 mLs by mouth daily as needed for mild constipation.   Yes [provider]  metFORMIN (GLUCOPHAGE) 1000 MG tablet TAKE 1 TABLET BY MOUTH TWICE DAILY WITH MEALS Patient taking differently: Take 1,000 mg by mouth 2 (two) times a day.  06/10/18  Yes Jerrol Banana., MD  nystatin cream (MYCOSTATIN) Apply topically 2 (two) times daily. Patient taking differently: Apply 1 application topically 2 (two) times daily as needed (rash).  04/24/18  Yes Jerrol Banana., MD  OVER THE COUNTER MEDICATION Apply 1 application topically daily as needed (pain). Outback topical pain relief   Yes [provider]  sertraline (ZOLOFT) 100 MG tablet Take 1 tablet (100 mg total) by mouth daily. Patient taking differently: Take 100 mg by mouth at bedtime.  08/06/18  Yes Jerrol Banana., MD  tamsulosin (FLOMAX) 0.4 MG CAPS capsule Take 1 capsule (0.4 mg total) by mouth daily. 12/09/18  Yes [name], MD  vitamin B-12 (CYANOCOBALAMIN) 1000 MCG tablet Take 1,000 mcg by mouth daily.   Yes [provider]  aspirin EC 81 MG tablet Take 81 mg by mouth every evening.     [provider]  clobetasol cream (TEMOVATE) 4.09 % Apply 1 application  topically as needed (for skin rash). Patient not taking: Reported on 12/17/2018 01/01/18   Jerrol Banana., MD  diphenhydrAMINE (BENADRYL) 25 MG tablet Take 25 mg by mouth daily as needed for allergies.    [provider]  feeding supplement, ENSURE ENLIVE, (ENSURE ENLIVE) LIQD Take 237 mLs by mouth 3 (three) times daily between meals. Patient taking differently: Take 237 mLs by mouth 2 (two) times a week.  12/19/17   Salary, Holly Bodily D, MD  glucose blood (CONTOUR NEXT TEST) test strip Check blood sugars 3 times daily. 11/26/18   Jerrol Banana., MD  HYDROcodone-acetaminophen (NORCO/VICODIN) 5-325 MG tablet Take 1-2 tablets by mouth every 6 (six) hours as needed for moderate pain. Patient not taking: Reported on 12/17/2018 12/09/18   [name], MD  hydrOXYzine (ATARAX/VISTARIL) 25 MG tablet Take 1 tablet (25 mg total) by mouth every 8 (eight) hours as needed for itching. 06/05/18   Jerrol Banana., MD  oxybutynin (DITROPAN) 5 MG tablet Take 1 tablet (5 mg total) by mouth every 8 (eight) hours as needed for bladder spasms. Patient not taking: Reported on 12/17/2018 12/09/18   [name], MD  vitamin C (ASCORBIC ACID) 500 MG  tablet Take 1,000-1,500 mg by mouth daily as needed (immune support).    [provider]     Family History  Problem Relation Age of Onset   Heart disease Mother    Cancer Father        Lung and colon cancer   Heart disease Father    Emphysema Maternal Grandfather    Tuberculosis Maternal Grandmother     Social History   Socioeconomic History   Marital status: Married    Spouse name: [name]   Number of children: 1   Years of education: Not on file   Highest education level: Associate degree: occupational, Hotel manager, or vocational program  Occupational History   Occupation: retired  Scientist, product/process development strain: Not hard at all   Food insecurity:    Worry: Never true    Inability: Never true    Transportation needs:    Medical: No    Non-medical: No  Tobacco Use   Smoking status: Former Smoker    Types: Cigars    Last attempt to quit: 10/09/1978    Years since quitting: 40.2   Smokeless tobacco: Former Systems developer    Types: Chew    Quit date: 12/04/1988   Tobacco comment: on occasion  Substance and Sexual Activity   Alcohol use: Not Currently   Drug use: No   Sexual activity: Not Currently  Lifestyle   Physical activity:    Days per week: 0 days    Minutes per session: 0 min   Stress: Only a little  Relationships   Social connections:    Talks on phone: More than three times a week    Gets together: More than three times a week    Attends religious service: More than 4 times per year    Active member of club or organization: Not on file    Attends meetings of clubs or organizations: Not on file    Relationship status: Married  Other Topics Concern   Not on file  Social History Narrative   Not on file    ECOG Status: 1 - Symptomatic but completely ambulatory     Physical Exam  Vital Signs: BP 107/68    Pulse 67    Temp 98.2 F (36.8 C) (Oral)    Resp 20    SpO2 97%   Constitutional: Oriented to person, place, and time. Well-developed and well-nourished. No distress.   HENT:  Head: Normocephalic and atraumatic.  Eyes: Conjunctivae and EOM are normal. Right eye exhibits no discharge. Left eye exhibits no discharge. No scleral icterus.  Neck: No JVD present.  Mallampati classification II  Pulmonary/Chest: CTA. RRR. Effort normal. No stridor. No respiratory distress.  Abdomen: soft, non distended Neurological:  alert and oriented to person, place, and time.  Skin: Skin is warm and dry.  not diaphoretic.  Psychiatric:   normal mood and affect.   behavior is normal. Judgment and thought content normal.   Review of Systems Review of Systems: A 12 point ROS discussed and pertinent positives are indicated in the HPI above.  All other systems are  negative.   Imaging: Dg Abd 1 View  Result Date: 12/09/2018 CLINICAL DATA:  Cystoscopy and left retrograde pyelogram. Transitional cell carcinoma. EXAM: ABDOMEN - 1 VIEW; DG C-ARM 1-60 MIN-NO REPORT COMPARISON:  CT abdomen pelvis dated Nov 25, 2018. FLUOROSCOPY TIME:  Fluoroscopy Time:  1 minutes, 13 seconds. FINDINGS: Left retrograde pyelogram demonstrates mild irregularity of the lower pole collecting system. No  hydronephrosis. Successful left double-J ureteral stent placement. IMPRESSION: 1. 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 2. Successful left double-J ureteral stent placement. Electronically Signed   By: Titus Dubin M.D.   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 Follow-up study. EXAM: CT CHEST, ABDOMEN, AND PELVIS WITH CONTRAST TECHNIQUE: Multidetector CT imaging of the chest, abdomen and pelvis was performed following the standard protocol during bolus administration of intravenous contrast. CONTRAST:  155mL ISOVUE-300 IOPAMIDOL (ISOVUE-300) INJECTION 61% COMPARISON:  CT the chest, abdomen and pelvis 08/29/2018. FINDINGS: CT CHEST FINDINGS Cardiovascular: Heart size is normal. There is no significant pericardial fluid, thickening or pericardial calcification. There is aortic atherosclerosis, as well as atherosclerosis of the great vessels of the mediastinum and the coronary arteries, including calcified atherosclerotic plaque in the left main, left anterior descending, left circumflex and right coronary arteries. Right internal jugular single-lumen porta cath with tip terminating in the distal superior vena cava. Mediastinum/Nodes: No pathologically enlarged mediastinal or hilar lymph nodes. Esophagus is unremarkable in appearance. No axillary  lymphadenopathy. Lungs/Pleura: Widespread areas of mild septal thickening are noted throughout the lungs bilaterally, most evident throughout the mid to lower lungs, slightly progressive compared to the prior examination, and clearly progressive compared to more remote prior studies, concerning for interstitial lung disease. No acute consolidative airspace disease. No pleural effusions. No suspicious appearing pulmonary nodules or masses. Musculoskeletal: There are no aggressive appearing lytic or blastic lesions noted in the visualized portions of the skeleton. CT ABDOMEN PELVIS FINDINGS Hepatobiliary: No suspicious cystic or solid hepatic lesions. No intra or extrahepatic biliary ductal dilatation. Tiny calcified gallstone lying dependently in the gallbladder. No findings to suggest an associated acute cholecystitis at this time. Pancreas: Diffuse fatty atrophy throughout the pancreas. No pancreatic mass. No pancreatic ductal dilatation. No pancreatic or peripancreatic fluid or inflammatory changes. Spleen: Unremarkable. Adrenals/Urinary Tract: Previously identified infiltrative tumor involving the lower pole collecting system of the left kidney extending into the renal pelvis appears grossly similar to the prior examination. This is irregular in shape and therefore difficult to accurately measure, but is estimated to be approximately 1.8 x 3.0 x 1.9 cm (coronal image 92 of series 5 and axial image 68 of series 2). This is associated with decreased perfusion in the lower and interpolar region of the left kidney, as well as focal calyceal dilatation in the interpolar region, similar to the prior study. Multiple other well-defined low-attenuation nonenhancing lesions in both kidneys are compatible with simple cysts, largest of which is exophytic in the upper pole of the right kidney measuring 3.8 cm in diameter. No hydroureteronephrosis. Urinary bladder is normal in appearance. Bilateral adrenal glands are normal in  appearance. Stomach/Bowel: Normal appearance of the stomach. No pathologic dilatation of small bowel or colon. Normal appendix. Vascular/Lymphatic: Aortic atherosclerosis, without evidence of aneurysm or dissection in the abdominal or pelvic vasculature. Compared to the prior examination there has been interval worsening of left para-aortic lymphadenopathy adjacent to the renal hilum (axial image 70 of series 2) measuring up to 1.9 cm in short axis (previously 11 mm). No pelvic lymphadenopathy is noted. Reproductive: Prostate gland and seminal vesicles are unremarkable in appearance. Other: No significant volume of ascites.  No pneumoperitoneum. Musculoskeletal: There are no aggressive appearing lytic or blastic lesions noted in the visualized portions of the  skeleton. IMPRESSION: 1. Although the primary lesion in the lower pole collecting system of the left kidney and left renal pelvis appears grossly stable in size compared to the prior examination, there is worsening left para-aortic lymphadenopathy immediately inferior to the left renal hilum, as detailed above, compatible with progressive nodal metastasis. No other definite sites of metastatic disease are noted elsewhere in the abdomen or pelvis. 2. Cholelithiasis without findings to suggest an acute cholecystitis at this time. 3. The appearance of the lungs is compatible with early but progressive interstitial lung disease. Outpatient referral to pulmonology for further evaluation is suggested. 4. 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 Assessment for potential risk factor modification, dietary therapy or pharmacologic therapy may be warranted, if clinically indicated. 5. Additional incidental findings, as above. Electronically Signed   By: Vinnie Langton M.D.   On: 11/26/2018 09:00   Ct Abdomen Pelvis W Contrast  Result Date: 11/26/2018 CLINICAL DATA:  73 year old male with history of stage IV metastatic  urothelial carcinoma with lymph node metastases. Follow-up study. EXAM: CT CHEST, ABDOMEN, AND PELVIS WITH CONTRAST TECHNIQUE: Multidetector CT imaging of the chest, abdomen and pelvis was performed following the standard protocol during bolus administration of intravenous contrast. CONTRAST:  169mL ISOVUE-300 IOPAMIDOL (ISOVUE-300) INJECTION 61% COMPARISON:  CT the chest, abdomen and pelvis 08/29/2018. FINDINGS: CT CHEST FINDINGS Cardiovascular: Heart size is normal. There is no significant pericardial fluid, thickening or pericardial calcification. There is aortic atherosclerosis, as well as atherosclerosis of the great vessels of the mediastinum and the coronary arteries, including calcified atherosclerotic plaque in the left main, left anterior descending, left circumflex and right coronary arteries. Right internal jugular single-lumen porta cath with tip terminating in the distal superior vena cava. Mediastinum/Nodes: No pathologically enlarged mediastinal or hilar lymph nodes. Esophagus is unremarkable in appearance. No axillary lymphadenopathy. Lungs/Pleura: Widespread areas of mild septal thickening are noted throughout the lungs bilaterally, most evident throughout the mid to lower lungs, slightly progressive compared to the prior examination, and clearly progressive compared to more remote prior studies, concerning for interstitial lung disease. No acute consolidative airspace disease. No pleural effusions. No suspicious appearing pulmonary nodules or masses. Musculoskeletal: There are no aggressive appearing lytic or blastic lesions noted in the visualized portions of the skeleton. CT ABDOMEN PELVIS FINDINGS Hepatobiliary: No suspicious cystic or solid hepatic lesions. No intra or extrahepatic biliary ductal dilatation. Tiny calcified gallstone lying dependently in the gallbladder. No findings to suggest an associated acute cholecystitis at this time. Pancreas: Diffuse fatty atrophy throughout the  pancreas. No pancreatic mass. No pancreatic ductal dilatation. No pancreatic or peripancreatic fluid or inflammatory changes. Spleen: Unremarkable. Adrenals/Urinary Tract: Previously identified infiltrative tumor involving the lower pole collecting system of the left kidney extending into the renal pelvis appears grossly similar to the prior examination. This is irregular in shape and therefore difficult to accurately measure, but is estimated to be approximately 1.8 x 3.0 x 1.9 cm (coronal image 92 of series 5 and axial image 68 of series 2). This is associated with decreased perfusion in the lower and interpolar region of the left kidney, as well as focal calyceal dilatation in the interpolar region, similar to the prior study. Multiple other well-defined low-attenuation nonenhancing lesions in both kidneys are compatible with simple cysts, largest of which is exophytic in the upper pole of the right kidney measuring 3.8 cm in diameter. No hydroureteronephrosis. Urinary bladder is normal in appearance. Bilateral adrenal glands are normal in appearance. Stomach/Bowel:  Normal appearance of the stomach. No pathologic dilatation of small bowel or colon. Normal appendix. Vascular/Lymphatic: Aortic atherosclerosis, without evidence of aneurysm or dissection in the abdominal or pelvic vasculature. Compared to the prior examination there has been interval worsening of left para-aortic lymphadenopathy adjacent to the renal hilum (axial image 70 of series 2) measuring up to 1.9 cm in short axis (previously 11 mm). No pelvic lymphadenopathy is noted. Reproductive: Prostate gland and seminal vesicles are unremarkable in appearance. Other: No significant volume of ascites.  No pneumoperitoneum. Musculoskeletal: There are no aggressive appearing lytic or blastic lesions noted in the visualized portions of the skeleton. IMPRESSION: 1. Although the primary lesion in the lower pole collecting system of the left kidney and left  renal pelvis appears grossly stable in size compared to the prior examination, there is worsening left para-aortic lymphadenopathy immediately inferior to the left renal hilum, as detailed above, compatible with progressive nodal metastasis. No other definite sites of metastatic disease are noted elsewhere in the abdomen or pelvis. 2. Cholelithiasis without findings to suggest an acute cholecystitis at this time. 3. The appearance of the lungs is compatible with early but progressive interstitial lung disease. Outpatient referral to pulmonology for further evaluation is suggested. 4. 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 Assessment for potential risk factor modification, dietary therapy or pharmacologic therapy may be warranted, if clinically indicated. 5. Additional incidental findings, as above. Electronically Signed   By: Vinnie Langton M.D.   On: 11/26/2018 09:00   Dg C-arm 1-60 Min-no Report  Result Date: 12/09/2018 CLINICAL DATA:  Cystoscopy and left retrograde pyelogram. Transitional cell carcinoma. EXAM: ABDOMEN - 1 VIEW; DG C-ARM 1-60 MIN-NO REPORT COMPARISON:  CT abdomen pelvis dated Nov 25, 2018. FLUOROSCOPY TIME:  Fluoroscopy Time:  1 minutes, 13 seconds. FINDINGS: Left retrograde pyelogram demonstrates mild irregularity of the lower pole collecting system. No hydronephrosis. Successful left double-J ureteral stent placement. IMPRESSION: 1. 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 2. Successful left double-J ureteral stent placement. Electronically Signed   By: Titus Dubin M.D.   On: 12/09/2018 10:56    Labs:  CBC: Recent Labs    10/21/18 0925 11/11/18 1345 12/03/18 0906 12/17/18 0911  WBC 6.3 7.4 7.7 8.4  HGB 11.9* 11.8* 11.2* 11.1*  HCT 36.3* 35.4* 33.2* 33.5*  PLT 193 195 210 240    COAGS: Recent Labs    12/17/18 0911  INR 1.1     BMP: Recent Labs    09/30/18 0924 10/21/18 0925 11/11/18 1345 12/03/18 0906  NA 137 136 136 138  K 4.2 4.0 4.3 4.0  CL 100 102 100 101  CO2 28 26 26 28   GLUCOSE 154* 168* 227* 148*  BUN 18 17 20 17   CALCIUM 9.9 9.5 9.5 9.6  CREATININE 0.81 0.83 0.79 0.74  GFRNONAA >60 >60 >60 >60  GFRAA >60 >60 >60 >60    LIVER FUNCTION TESTS: Recent Labs    09/30/18 0924 10/21/18 0925 11/11/18 1345 12/03/18 0906  BILITOT 0.9 0.8 0.7 1.0  AST 19 17 20 15   ALT 15 11 13 12   ALKPHOS 93 77 90 93  PROT 7.8 7.3 7.2 7.2  ALBUMIN 4.1 3.9 3.8 3.7    TUMOR MARKERS: No results for input(s): AFPTM, CEA, CA199, CHROMGRNA in the last 8760 hours.  Assessment and Plan:  My impression is that the patient is appropriate candidate for percutaneous core  biopsy of the left periaortic adenopathy under CT guidance, with moderate sedation. Pt seen, chart and imaging reviewed. Discussed with pt the procedure; alternatives; benefits; risks including (but not limited to) bleeding, organ or nerve damage,   admission, infection. All questions answered. Pt understands and agrees to proceed.  Thank you for this interesting consult.  I greatly enjoyed meeting MOIZ RYANT and look forward to participating in their care.  A copy of this report was sent to the requesting provider on this date.  Electronically Signed: Rickard Rhymes, MD 12/17/2018, 10:18 AM   I spent a total of  30 Minutes   in face to face in clinical consultation, greater than 50% of which was counseling/coordinating care for urothelial carcinoma.   Please call me [phone] or page [phone] with questions.

## 2018-12-18 ENCOUNTER — Other Ambulatory Visit: Payer: Self-pay

## 2018-12-18 ENCOUNTER — Ambulatory Visit (INDEPENDENT_AMBULATORY_CARE_PROVIDER_SITE_OTHER): Payer: Medicare Other | Admitting: Family Medicine

## 2018-12-18 ENCOUNTER — Telehealth: Payer: Self-pay | Admitting: Urology

## 2018-12-18 ENCOUNTER — Encounter: Payer: Self-pay | Admitting: Family Medicine

## 2018-12-18 VITALS — BP 110/62 | HR 62 | Temp 98.7°F | Resp 16 | Wt 158.0 lb

## 2018-12-18 DIAGNOSIS — I10 Essential (primary) hypertension: Secondary | ICD-10-CM | POA: Diagnosis not present

## 2018-12-18 DIAGNOSIS — I25119 Atherosclerotic heart disease of native coronary artery with unspecified angina pectoris: Secondary | ICD-10-CM | POA: Diagnosis not present

## 2018-12-18 DIAGNOSIS — F32A Depression, unspecified: Secondary | ICD-10-CM

## 2018-12-18 DIAGNOSIS — G4733 Obstructive sleep apnea (adult) (pediatric): Secondary | ICD-10-CM | POA: Diagnosis not present

## 2018-12-18 DIAGNOSIS — F329 Major depressive disorder, single episode, unspecified: Secondary | ICD-10-CM | POA: Diagnosis not present

## 2018-12-18 DIAGNOSIS — C642 Malignant neoplasm of left kidney, except renal pelvis: Secondary | ICD-10-CM | POA: Diagnosis not present

## 2018-12-18 DIAGNOSIS — E538 Deficiency of other specified B group vitamins: Secondary | ICD-10-CM | POA: Diagnosis not present

## 2018-12-18 DIAGNOSIS — E119 Type 2 diabetes mellitus without complications: Secondary | ICD-10-CM | POA: Diagnosis not present

## 2018-12-18 LAB — POCT GLYCOSYLATED HEMOGLOBIN (HGB A1C)
Est. average glucose Bld gHb Est-mCnc: 174
Hemoglobin A1C: 7.7 % — AB (ref 4.0–5.6)

## 2018-12-18 LAB — SURGICAL PATHOLOGY

## 2018-12-18 MED ORDER — SERTRALINE HCL 100 MG PO TABS: 100.0000 mg | ORAL_TABLET | Freq: Every day | ORAL | 3 refills | Status: DC

## 2018-12-18 NOTE — Progress Notes (Signed)
Patient: Patrick Mcgee Male    DOB: Jan 28, 1946   73 y.o.   MRN: [id number] Visit Date: 12/18/2018  Today's Provider: Wilhemena Durie, MD   Chief Complaint  Patient presents with  . Follow-up  . Diabetes   Subjective:     HPI Patient comes in today for a follow up. He was last seen in the office 5 months ago. He was advised to increase setraline from 50 to 100mg  daily. He has tolerated medication changes well. Feels much better with this dose.  Lab Results  Component Value Date   HGBA1C 7.7 (A) 12/18/2018   HGBA1C 7.2 (A) 02/27/2018   HGBA1C 7.9 10/25/2017    Last seen for diabetes 5 months ago.  Management since then includes no changes. He reports good compliance with treatment. He is not having side effects. However, patient does mention that he is feeling more tired lately.  Current symptoms include none and have been stable. Home blood sugar records: trend: stable  Episodes of hypoglycemia? no   Most Recent Eye Exam: due Weight trend: decreasing steadily Prior visit with dietician: No Current exercise: no regular exercise, but does try to stay active.  Current diet habits: well balanced  Pertinent Labs:    Component Value Date/Time   CHOL 119 03/15/2017 1042   TRIG 60 03/15/2017 1042   HDL 45 03/15/2017 1042   LDLCALC 62 03/15/2017 1042   CREATININE 0.74 12/03/2018 0906   CREATININE 0.69 02/03/2013 2223    Wt Readings from Last 3 Encounters:  12/18/18 158 lb (71.7 kg)  12/11/18 159 lb 6.4 oz (72.3 kg)  12/09/18 158 lb 4 oz (71.8 kg)    Allergies  Allergen Reactions  . Sulfa Antibiotics Other (See Comments)    Joint pain  . Ace Inhibitors Cough  . Invokana [Canagliflozin] Other (See Comments)    Leg pain  . Penicillins Rash    Did it involve swelling of the face/tongue/throat, SOB, or low BP? No Did it involve sudden or severe rash/hives, skin peeling, or any reaction on the inside of your mouth or nose? Yes Did you need to seek  medical attention at a hospital or doctor's office? Yes When did it last happen?childhood allergy If all above answers are "NO", may proceed with cephalosporin use.      Current Outpatient Medications:  .  aspirin EC 81 MG tablet, Take 81 mg by mouth every evening. , Disp: , Rfl:  .  atorvastatin (LIPITOR) 40 MG tablet, Take 1 tablet (40 mg total) by mouth at bedtime., Disp: 90 tablet, Rfl: 3 .  diphenhydrAMINE (BENADRYL) 25 MG tablet, Take 25 mg by mouth daily as needed for allergies., Disp: , Rfl:  .  empagliflozin (JARDIANCE) 10 MG TABS tablet, Take 10 mg by mouth daily. (Patient taking differently: Take 10 mg by mouth daily as needed (if blood sugar is over 200). ), Disp: 30 tablet, Rfl: 11 .  feeding supplement, ENSURE ENLIVE, (ENSURE ENLIVE) LIQD, Take 237 mLs by mouth 3 (three) times daily between meals. (Patient taking differently: Take 237 mLs by mouth 2 (two) times a week. ), Disp: 90 Bottle, Rfl: 0 .  fluticasone (FLONASE) 50 MCG/ACT nasal spray, Use 2 spray(s) in each nostril once daily (Patient taking differently: Place 2 sprays into both nostrils daily. ), Disp: 48 g, Rfl: 3 .  glucose blood (CONTOUR NEXT TEST) test strip, Check blood sugars 3 times daily., Disp: 300 each, Rfl: 11 .  hydrOXYzine (ATARAX/VISTARIL)  25 MG tablet, Take 1 tablet (25 mg total) by mouth every 8 (eight) hours as needed for itching., Disp: 30 tablet, Rfl: 3 .  Ivermectin (SOOLANTRA) 1 % CREA, Apply 1 application topically daily as needed (rosacea). To face, Disp: , Rfl:  .  lidocaine-prilocaine (EMLA) cream, Apply 1 application topically as needed. (Patient taking differently: Apply 1 application topically as needed (port access). ), Disp: 30 g, Rfl: 3 .  losartan (COZAAR) 100 MG tablet, TAKE 1 TABLET BY MOUTH ONCE DAILY (Patient taking differently: Take 100 mg by mouth at bedtime. ), Disp: 90 tablet, Rfl: 3 .  magnesium hydroxide (MILK OF MAGNESIA) 400 MG/5ML suspension, Take 15 mLs by mouth daily as  needed for mild constipation., Disp: , Rfl:  .  metFORMIN (GLUCOPHAGE) 1000 MG tablet, TAKE 1 TABLET BY MOUTH TWICE DAILY WITH MEALS (Patient taking differently: Take 1,000 mg by mouth 2 (two) times a day. ), Disp: 180 tablet, Rfl: 3 .  nystatin cream (MYCOSTATIN), Apply topically 2 (two) times daily. (Patient taking differently: Apply 1 application topically 2 (two) times daily as needed (rash). ), Disp: 15 g, Rfl: 1 .  OVER THE COUNTER MEDICATION, Apply 1 application topically daily as needed (pain). Outback topical pain relief, Disp: , Rfl:  .  sertraline (ZOLOFT) 100 MG tablet, Take 1 tablet (100 mg total) by mouth daily. (Patient taking differently: Take 100 mg by mouth at bedtime. ), Disp: 30 tablet, Rfl: 5 .  tamsulosin (FLOMAX) 0.4 MG CAPS capsule, Take 1 capsule (0.4 mg total) by mouth daily., Disp: 14 capsule, Rfl: 0 .  vitamin B-12 (CYANOCOBALAMIN) 1000 MCG tablet, Take 1,000 mcg by mouth daily., Disp: , Rfl:  .  vitamin C (ASCORBIC ACID) 500 MG tablet, Take 1,000-1,500 mg by mouth daily as needed (immune support)., Disp: , Rfl:  .  carvedilol (COREG) 12.5 MG tablet, TAKE 1 TABLET BY MOUTH TWICE DAILY (Patient taking differently: Take 12.5 mg by mouth 2 (two) times a day. ), Disp: 180 tablet, Rfl: 3 .  clobetasol cream (TEMOVATE) 7.00 %, Apply 1 application topically as needed (for skin rash). (Patient not taking: Reported on 12/17/2018), Disp: 30 g, Rfl: 1 .  HYDROcodone-acetaminophen (NORCO/VICODIN) 5-325 MG tablet, Take 1-2 tablets by mouth every 6 (six) hours as needed for moderate pain. (Patient not taking: Reported on 12/17/2018), Disp: 6 tablet, Rfl: 0 .  oxybutynin (DITROPAN) 5 MG tablet, Take 1 tablet (5 mg total) by mouth every 8 (eight) hours as needed for bladder spasms. (Patient not taking: Reported on 12/17/2018), Disp: 30 tablet, Rfl: 0 No current facility-administered medications for this visit.   Facility-Administered Medications Ordered in Other Visits:  .  Influenza vac split  quadrivalent PF (FLUZONE HIGH-DOSE) injection 0.5 mL, 0.5 mL, Intramuscular, Once, Karen Kitchens, NP  Review of Systems  Constitutional: Positive for fatigue. Negative for activity change, appetite change, chills and diaphoresis.  Respiratory: Negative for cough and shortness of breath.   Cardiovascular: Negative for chest pain, palpitations and leg swelling.  Endocrine: Negative for cold intolerance, heat intolerance, polydipsia, polyphagia and polyuria.  Allergic/Immunologic: Negative.   Neurological: Negative for dizziness.  Psychiatric/Behavioral: Negative for agitation, self-injury, sleep disturbance and suicidal ideas.    Social History   Tobacco Use  . Smoking status: Former Smoker    Types: Cigars    Last attempt to quit: 10/09/1978    Years since quitting: 40.2  . Smokeless tobacco: Former Systems developer    Types: Chew    Quit date: 12/04/1988  .  Tobacco comment: on occasion  Substance Use Topics  . Alcohol use: Not Currently      Objective:   BP 110/62 (BP Location: Left Arm, Patient Position: Sitting, Cuff Size: Normal)   Pulse 62   Temp 98.7 F (37.1 C)   Resp 16   Wt 158 lb (71.7 kg)   SpO2 97%   BMI 24.02 kg/m  Vitals:   12/18/18 1123  BP: 110/62  Pulse: 62  Resp: 16  Temp: 98.7 F (37.1 C)  SpO2: 97%  Weight: 158 lb (71.7 kg)     Physical Exam Vitals signs reviewed.  Constitutional:      Appearance: He is well-developed.  HENT:     Head: Normocephalic and atraumatic.     Right Ear: External ear normal.     Left Ear: External ear normal.     Nose: Nose normal.  Eyes:     General: No scleral icterus.    Conjunctiva/sclera: Conjunctivae normal.     Pupils: Pupils are equal, round, and reactive to light.  Neck:     Thyroid: No thyromegaly.  Cardiovascular:     Rate and Rhythm: Normal rate and regular rhythm.     Heart sounds: Normal heart sounds.  Pulmonary:     Effort: Pulmonary effort is normal.     Breath sounds: Normal breath sounds.   Abdominal:     Palpations: Abdomen is soft.  Lymphadenopathy:     Cervical: No cervical adenopathy.  Skin:    General: Skin is warm and dry.  Neurological:     Mental Status: He is alert and oriented to person, place, and time.  Psychiatric:        Behavior: Behavior normal.        Thought Content: Thought content normal.        Judgment: Judgment normal.         Assessment & Plan    1. Type 2 diabetes mellitus without complication, without long-term current use of insulin (HCC)  - POCT glycosylated hemoglobin (Hb A1C)--7.7 today  2. Depression, unspecified depression type Improved. - sertraline (ZOLOFT) 100 MG tablet; Take 1 tablet (100 mg total) by mouth daily.  Dispense: 90 tablet; Refill: 3   3. Atherosclerosis of native coronary artery of native heart with angina pectoris ([place])   4. Essential (primary) hypertension   5. Obstructive sleep apnea syndrome   6. Urothelial carcinoma of kidney, left [facility]) Per Oncology.  7. B12 deficiency     I have done the exam and reviewed the above chart and it is accurate to the best of my knowledge. Development worker, community has been used in this note in any air is in the dictation or transcription are unintentional.  Wilhemena Durie, MD  [place]

## 2018-12-18 NOTE — Telephone Encounter (Signed)
We have enough tissue from the two biopsies to run the test Dr. Janese Banks is requesting.    Please instruct him how to take out his stent on tether and review precautions.  [name], MD

## 2018-12-18 NOTE — Telephone Encounter (Signed)
Patient notified he states he has removed before and is comfortable removing

## 2018-12-19 NOTE — Progress Notes (Signed)
On schedule Tues 6/16 for Tecentriq or Padcev.  No new orders for either drug.  Contacted MD.  To continue on Tecentriq for that day then determine what treatment after biopsy comes back.

## 2018-12-23 LAB — CYTOLOGY - NON PAP

## 2018-12-24 ENCOUNTER — Inpatient Hospital Stay: Payer: Medicare Other | Attending: Oncology

## 2018-12-24 ENCOUNTER — Other Ambulatory Visit: Payer: Self-pay | Admitting: Urology

## 2018-12-24 ENCOUNTER — Inpatient Hospital Stay (HOSPITAL_BASED_OUTPATIENT_CLINIC_OR_DEPARTMENT_OTHER): Payer: Medicare Other | Admitting: Oncology

## 2018-12-24 ENCOUNTER — Inpatient Hospital Stay: Payer: Medicare Other

## 2018-12-24 ENCOUNTER — Encounter: Payer: Self-pay | Admitting: Oncology

## 2018-12-24 ENCOUNTER — Other Ambulatory Visit: Payer: Self-pay

## 2018-12-24 VITALS — BP 90/63 | HR 71 | Temp 97.0°F | Ht 68.0 in | Wt 154.7 lb

## 2018-12-24 DIAGNOSIS — R634 Abnormal weight loss: Secondary | ICD-10-CM

## 2018-12-24 DIAGNOSIS — Z87891 Personal history of nicotine dependence: Secondary | ICD-10-CM

## 2018-12-24 DIAGNOSIS — Z5112 Encounter for antineoplastic immunotherapy: Secondary | ICD-10-CM

## 2018-12-24 DIAGNOSIS — R5383 Other fatigue: Secondary | ICD-10-CM | POA: Diagnosis not present

## 2018-12-24 DIAGNOSIS — C642 Malignant neoplasm of left kidney, except renal pelvis: Secondary | ICD-10-CM

## 2018-12-24 DIAGNOSIS — C689 Malignant neoplasm of urinary organ, unspecified: Secondary | ICD-10-CM

## 2018-12-24 DIAGNOSIS — D696 Thrombocytopenia, unspecified: Secondary | ICD-10-CM

## 2018-12-24 LAB — CBC WITH DIFFERENTIAL/PLATELET
Abs Immature Granulocytes: 0.03 10*3/uL (ref 0.00–0.07)
Basophils Absolute: 0.1 10*3/uL (ref 0.0–0.1)
Basophils Relative: 1 %
Eosinophils Absolute: 0.5 10*3/uL (ref 0.0–0.5)
Eosinophils Relative: 8 %
HCT: 31.7 % — ABNORMAL LOW (ref 39.0–52.0)
Hemoglobin: 10.5 g/dL — ABNORMAL LOW (ref 13.0–17.0)
Immature Granulocytes: 0 %
Lymphocytes Relative: 16 %
Lymphs Abs: 1.1 10*3/uL (ref 0.7–4.0)
MCH: 29.2 pg (ref 26.0–34.0)
MCHC: 33.1 g/dL (ref 30.0–36.0)
MCV: 88.3 fL (ref 80.0–100.0)
Monocytes Absolute: 0.6 10*3/uL (ref 0.1–1.0)
Monocytes Relative: 8 %
Neutro Abs: 4.7 10*3/uL (ref 1.7–7.7)
Neutrophils Relative %: 67 %
Platelets: 242 10*3/uL (ref 150–400)
RBC: 3.59 MIL/uL — ABNORMAL LOW (ref 4.22–5.81)
RDW: 14.2 % (ref 11.5–15.5)
WBC: 7.1 10*3/uL (ref 4.0–10.5)
nRBC: 0 % (ref 0.0–0.2)

## 2018-12-24 LAB — COMPREHENSIVE METABOLIC PANEL
ALT: 12 U/L (ref 0–44)
AST: 16 U/L (ref 15–41)
Albumin: 3.7 g/dL (ref 3.5–5.0)
Alkaline Phosphatase: 95 U/L (ref 38–126)
Anion gap: 9 (ref 5–15)
BUN: 19 mg/dL (ref 8–23)
CO2: 29 mmol/L (ref 22–32)
Calcium: 9.6 mg/dL (ref 8.9–10.3)
Chloride: 98 mmol/L (ref 98–111)
Creatinine, Ser: 0.87 mg/dL (ref 0.61–1.24)
GFR calc Af Amer: >60 mL/min (ref >60)
GFR calc non Af Amer: >60 mL/min (ref >60)
Glucose, Bld: 237 mg/dL — ABNORMAL HIGH (ref 70–99)
Potassium: 4 mmol/L (ref 3.5–5.1)
Sodium: 136 mmol/L (ref 135–145)
Total Bilirubin: 0.9 mg/dL (ref 0.3–1.2)
Total Protein: 7.5 g/dL (ref 6.5–8.1)

## 2018-12-24 MED ORDER — SODIUM CHLORIDE 0.9 % IV SOLN
Freq: Once | INTRAVENOUS | Status: AC
  Administered 2018-12-24: 11:00:00 via INTRAVENOUS
  Filled 2018-12-24: qty 250

## 2018-12-24 MED ORDER — HEPARIN SOD (PORK) LOCK FLUSH 100 UNIT/ML IV SOLN
500.0000 [IU] | Freq: Once | INTRAVENOUS | Status: AC | PRN
  Administered 2018-12-24: 500 [IU]

## 2018-12-24 MED ORDER — LIDOCAINE-PRILOCAINE 2.5-2.5 % EX CREA: 1.0000 "application " | TOPICAL_CREAM | CUTANEOUS | 3 refills | Status: AC | PRN

## 2018-12-24 MED ORDER — SODIUM CHLORIDE 0.9 % IV SOLN
1200.0000 mg | Freq: Once | INTRAVENOUS | Status: AC
  Administered 2018-12-24: 1200 mg via INTRAVENOUS
  Filled 2018-12-24: qty 20

## 2018-12-24 MED ORDER — SODIUM CHLORIDE 0.9% FLUSH
10.0000 mL | INTRAVENOUS | Status: DC | PRN
  Administered 2018-12-24: 10:00:00 10 mL
  Filled 2018-12-24: qty 10

## 2018-12-24 MED ORDER — HEPARIN SOD (PORK) LOCK FLUSH 100 UNIT/ML IV SOLN
INTRAVENOUS | Status: AC
  Filled 2018-12-24: qty 5

## 2018-12-24 NOTE — Progress Notes (Signed)
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 [name] so far for his metastatic urothelial carcinoma. This was originally diagnosed in May 2019. He was noted to have a filling defect in the lower pole collecting system of the left kidney along with para-aortic and retroperitoneal adenopathy concerning for metastatic disease. He underwent left ureteroscopy and renal pelvis biopsy which revealed small fragments of high-grade urothelial carcinoma with small focus of invasion. He was started on carboplatin and gemcitabine chemotherapy in June 2019 and was continued on 05/27/2018 for 8 cycles. He tolerated chemotherapy well except for chemo-induced anemia for which she has been getting Procrit every 2 weeks. He did have response to his disease based on scans in August 2019. However repeat scan on 05/31/2018 showed increase in the size of the primary tumor from 1.2 to 1.9 cm and increase in left para-aortic adenopathy from 0.9 to 1.3 cm. Periportal adenopathy was stable at 1.4 cm. Second line immunotherapy was recommended. His initial biopsy specimen did not have enough sample to undergo  FGFR mutation testing. He also has mild interstitial lung disease for which he sees pulmonary but he is not on home oxygen. He has B12 deficiency for which he is on oral B12. Also has diabetes and coronary artery disease.Tecentriq started on 06/17/2018   Interval history-he has been losing weight and has lost about 5 pounds in the last 1 month.  Feels fatigued but denies other complaints.  Ureteral stent is out and hematuria has stopped.  ECOG PS- 1 Pain scale- 0 Opioid associated constipation- no  Review of systems- Review of Systems  Constitutional: Positive for malaise/fatigue and weight loss. Negative for chills and fever.  HENT: Negative for congestion, ear discharge and nosebleeds.   Eyes: Negative for blurred vision.  Respiratory: Negative for cough, hemoptysis, sputum production, shortness of breath and wheezing.   Cardiovascular: Negative for chest pain, palpitations, orthopnea and claudication.  Gastrointestinal: Negative for abdominal pain, blood in stool, constipation, diarrhea, heartburn, melena, nausea and vomiting.  Genitourinary: Negative for dysuria, flank pain, frequency, hematuria and urgency.  Musculoskeletal: Negative for back pain, joint pain and myalgias.  Skin: Negative for rash.  Neurological: Negative for dizziness, tingling, focal weakness, seizures, weakness and headaches.  Endo/Heme/Allergies: Does not bruise/bleed easily.  Psychiatric/Behavioral: Negative for depression and suicidal ideas. The patient does not have insomnia.        Allergies  Allergen Reactions   Sulfa Antibiotics Other (See Comments)    Joint pain   Ace Inhibitors Cough   Invokana [Canagliflozin] Other (See Comments)    Leg pain   Penicillins Rash    Did it involve swelling of the face/tongue/throat, SOB, or low BP? No Did it involve sudden or severe rash/hives, skin peeling, or any reaction on the inside of your mouth or nose? Yes Did you need  to seek medical attention at a  hospital or doctor's office? Yes When did it last happen?childhood allergy If all above answers are NO, may proceed with cephalosporin use.      Past Medical History:  Diagnosis Date   Acid reflux    Anxiety    Depression    Diabetes mellitus without complication ([place])    Hyperlipidemia    Hypertension    MRSA (methicillin resistant Staphylococcus aureus) infection 1992   history   Myocardial infarction ([place]) 1995   Sleep apnea    CPAP   Urothelial cancer ([place]) 11/2017   Left Urothelial mass, chemo tx's     Past Surgical History:  Procedure Laterality Date   CATARACT EXTRACTION Left    COLONOSCOPY  2010   [facility]   COLONOSCOPY WITH PROPOFOL N/A 10/04/2016   Procedure: COLONOSCOPY WITH PROPOFOL;  Surgeon: [name], MD;  Location: [place];  Service: Endoscopy;  Laterality: N/A;   [place], 2000, 2001, 2014   CYSTOSCOPY W/ RETROGRADES Bilateral 12/10/2017   Procedure: CYSTOSCOPY WITH RETROGRADE PYELOGRAM;  Surgeon: [name], MD;  Location: [facility];  Service: Urology;  Laterality: Bilateral;   CYSTOSCOPY W/ RETROGRADES Left 12/09/2018   Procedure: CYSTOSCOPY WITH RETROGRADE PYELOGRAM;  Surgeon: [name], MD;  Location: [facility];  Service: Urology;  Laterality: Left;   CYSTOSCOPY WITH BIOPSY Left 12/09/2018   Procedure: CYSTOSCOPY WITH URETERAL/RENAL PELVIC BIOPSY;  Surgeon: [name], MD;  Location: [facility];  Service: Urology;  Laterality: Left;   CYSTOSCOPY WITH STENT PLACEMENT Left 12/10/2017   Procedure: CYSTOSCOPY WITH STENT PLACEMENT;  Surgeon: [name], MD;  Location: [facility];  Service: Urology;  Laterality: Left;   CYSTOSCOPY WITH STENT PLACEMENT Left 12/09/2018   Procedure: CYSTOSCOPY WITH STENT PLACEMENT;  Surgeon: [name], MD;  Location: [facility];  Service: Urology;  Laterality: Left;   CYSTOSCOPY WITH URETEROSCOPY Left 12/09/2018   Procedure: CYSTOSCOPY WITH  URETEROSCOPY;  Surgeon: [name], MD;  Location: [facility];  Service: Urology;  Laterality: Left;   EYE SURGERY Bilateral    cataract   INGUINAL HERNIA REPAIR Right 08/09/2015   Procedure: HERNIA REPAIR INGUINAL ADULT;  Surgeon: [name], MD;  Location: [facility];  Service: General;  Laterality: Right;   NASAL SINUS SURGERY     nuclear stress test     PORTA CATH INSERTION N/A 12/19/2017   Procedure: PORTA CATH INSERTION;  Surgeon: [name], MD;  Location: [place] [facility];  Service: Cardiovascular;  Laterality: N/A;   URETERAL BIOPSY Left 12/10/2017   Procedure: URETERAL & renal PELVIS BIOPSY;  Surgeon: [name], MD;  Location: [facility];  Service: Urology;  Laterality: Left;   URETEROSCOPY Left 12/10/2017   Procedure: URETEROSCOPY;  Surgeon: [name], MD;  Location: [facility];  Service: Urology;  Laterality: Left;    Social History   Socioeconomic History   Marital status: Married    Spouse name: [name]   Number of children: 1   Years of education: Not on file   Highest education level: Associate degree: occupational, Hotel manager, or vocational program  Occupational History   Occupation: retired  Scientist, product/process development strain: Not hard at [employer]    Worry: Never true    Inability: Never true   Transportation needs    Medical: No    Non-medical: No  Tobacco Use   Smoking status: Former Smoker    Types: Cigars  Quit date: 10/09/1978    Years since quitting: 40.2   Smokeless tobacco: Former Systems developer    Types: Chew    Quit date: 12/04/1988   Tobacco comment: on occasion  Substance and Sexual Activity   Alcohol use: Not Currently   Drug use: No   Sexual activity: Not Currently  Lifestyle   Physical activity    Days per week: 0 days    Minutes per session: 0 min   Stress: Only a little  Relationships   Social connections    Talks on phone: More than three times a week    Gets together: More than  three times a week    Attends religious service: More than 4 times per year    Active member of club or organization: Not on file    Attends meetings of clubs or organizations: Not on file    Relationship status: Married   Intimate partner violence    Fear of current or ex partner: No    Emotionally abused: No    Physically abused: No    Forced sexual activity: No  Other Topics Concern   Not on file  Social History Narrative   Not on file    Family History  Problem Relation Age of Onset   Heart disease Mother    Cancer Father        Lung and colon cancer   Heart disease Father    Emphysema Maternal Grandfather    Tuberculosis Maternal Grandmother      Current Outpatient Medications:    aspirin EC 81 MG tablet, Take 81 mg by mouth every evening. , Disp: , Rfl:    atorvastatin (LIPITOR) 40 MG tablet, Take 1 tablet (40 mg total) by mouth at bedtime., Disp: 90 tablet, Rfl: 3   carvedilol (COREG) 12.5 MG tablet, TAKE 1 TABLET BY MOUTH TWICE DAILY (Patient taking differently: Take 12.5 mg by mouth 2 (two) times a day. ), Disp: 180 tablet, Rfl: 3   clobetasol cream (TEMOVATE) 4.65 %, Apply 1 application topically as needed (for skin rash)., Disp: 30 g, Rfl: 1   diphenhydrAMINE (BENADRYL) 25 MG tablet, Take 25 mg by mouth daily as needed for allergies., Disp: , Rfl:    empagliflozin (JARDIANCE) 10 MG TABS tablet, Take 10 mg by mouth daily. (Patient taking differently: Take 10 mg by mouth daily as needed (if blood sugar is over 200). ), Disp: 30 tablet, Rfl: 11   feeding supplement, ENSURE ENLIVE, (ENSURE ENLIVE) LIQD, Take 237 mLs by mouth 3 (three) times daily between meals. (Patient taking differently: Take 237 mLs by mouth 2 (two) times a week. ), Disp: 90 Bottle, Rfl: 0   fluticasone (FLONASE) 50 MCG/ACT nasal spray, Use 2 spray(s) in each nostril once daily (Patient taking differently: Place 2 sprays into both nostrils daily. ), Disp: 48 g, Rfl: 3   glucose blood  (CONTOUR NEXT TEST) test strip, Check blood sugars 3 times daily., Disp: 300 each, Rfl: 11   HYDROcodone-acetaminophen (NORCO/VICODIN) 5-325 MG tablet, Take 1-2 tablets by mouth every 6 (six) hours as needed for moderate pain., Disp: 6 tablet, Rfl: 0   hydrOXYzine (ATARAX/VISTARIL) 25 MG tablet, Take 1 tablet (25 mg total) by mouth every 8 (eight) hours as needed for itching., Disp: 30 tablet, Rfl: 3   Ivermectin (SOOLANTRA) 1 % CREA, Apply 1 application topically daily as needed (rosacea). To face, Disp: , Rfl:    lidocaine-prilocaine (EMLA) cream, Apply 1 application topically as needed (port access)., Disp: 1  g, Rfl: 3   losartan (COZAAR) 100 MG tablet, TAKE 1 TABLET BY MOUTH ONCE DAILY (Patient taking differently: Take 100 mg by mouth at bedtime. ), Disp: 90 tablet, Rfl: 3   magnesium hydroxide (MILK OF MAGNESIA) 400 MG/5ML suspension, Take 15 mLs by mouth daily as needed for mild constipation., Disp: , Rfl:    metFORMIN (GLUCOPHAGE) 1000 MG tablet, TAKE 1 TABLET BY MOUTH TWICE DAILY WITH MEALS (Patient taking differently: Take 1,000 mg by mouth 2 (two) times a day. ), Disp: 180 tablet, Rfl: 3   nystatin cream (MYCOSTATIN), Apply topically 2 (two) times daily. (Patient taking differently: Apply 1 application topically 2 (two) times daily as needed (rash). ), Disp: 15 g, Rfl: 1   OVER THE COUNTER MEDICATION, Apply 1 application topically daily as needed (pain). Outback topical pain relief, Disp: , Rfl:    oxybutynin (DITROPAN) 5 MG tablet, Take 1 tablet (5 mg total) by mouth every 8 (eight) hours as needed for bladder spasms., Disp: 30 tablet, Rfl: 0   sertraline (ZOLOFT) 100 MG tablet, Take 1 tablet (100 mg total) by mouth daily., Disp: 90 tablet, Rfl: 3   tamsulosin (FLOMAX) 0.4 MG CAPS capsule, Take 1 capsule (0.4 mg total) by mouth daily., Disp: 14 capsule, Rfl: 0   vitamin B-12 (CYANOCOBALAMIN) 1000 MCG tablet, Take 1,000 mcg by mouth daily., Disp: , Rfl:    vitamin C (ASCORBIC  ACID) 500 MG tablet, Take 1,000-1,500 mg by mouth daily as needed (immune support)., Disp: , Rfl:   Current Facility-Administered Medications:    atezolizumab (TECENTRIQ) 1,200 mg in sodium chloride 0.9 % 250 mL chemo infusion, 1,200 mg, Intravenous, Once, Sindy Guadeloupe, MD, Last Rate: 540 mL/hr at 12/24/18 1133, 1,200 mg at 12/24/18 1133   heparin lock flush 100 unit/mL, 500 Units, Intracatheter, Once PRN, Sindy Guadeloupe, MD   sodium chloride flush (NS) 0.9 % injection 10 mL, 10 mL, Intracatheter, PRN, Sindy Guadeloupe, MD, 10 mL at 12/24/18 1000  Facility-Administered Medications Ordered in Other Visits:    0.9 %  sodium chloride infusion, , Intravenous, Once, Sindy Guadeloupe, MD, Last Rate: 999 mL/hr at 12/24/18 1114   Influenza vac split quadrivalent PF (FLUZONE HIGH-DOSE) injection 0.5 mL, 0.5 mL, Intramuscular, Once, Karen Kitchens, NP  Physical exam:  Vitals:   12/24/18 0954  BP: 90/63  Pulse: 71  Temp: (!) 97 F (36.1 C)  TempSrc: Tympanic  Weight: 154 lb 11.2 oz (70.2 kg)  Height: '5\' 8"'$  (1.727 m)   Physical Exam Constitutional:      Comments: Thin man in no acute distress  HENT:     Head: Normocephalic and atraumatic.  Eyes:     Pupils: Pupils are equal, round, and reactive to light.  Neck:     Musculoskeletal: Normal range of motion.  Cardiovascular:     Rate and Rhythm: Normal rate and regular rhythm.     Heart sounds: Normal heart sounds.  Pulmonary:     Effort: Pulmonary effort is normal.     Breath sounds: Normal breath sounds.  Abdominal:     General: Bowel sounds are normal.     Palpations: Abdomen is soft.  Skin:    General: Skin is warm and dry.  Neurological:     Mental Status: He is alert and oriented to person, place, and time.      CMP Latest Ref Rng & Units 12/24/2018  Glucose 70 - 99 mg/dL 237(H)  BUN 8 - 23 mg/dL 19  Creatinine 0.61 - 1.24 mg/dL 0.87  Sodium 135 - 145 mmol/L 136  Potassium 3.5 - 5.1 mmol/L 4.0  Chloride 98 - 111 mmol/L 98   CO2 22 - 32 mmol/L 29  Calcium 8.9 - 10.3 mg/dL 9.6  Total Protein 6.5 - 8.1 g/dL 7.5  Total Bilirubin 0.3 - 1.2 mg/dL 0.9  Alkaline Phos 38 - 126 U/L 95  AST 15 - 41 U/L 16  ALT 0 - 44 U/L 12   CBC Latest Ref Rng & Units 12/24/2018  WBC 4.0 - 10.5 K/uL 7.1  Hemoglobin 13.0 - 17.0 g/dL 10.5(L)  Hematocrit 39.0 - 52.0 % 31.7(L)  Platelets 150 - 400 K/uL 242    No images are attached to the encounter.  Dg Abd 1 View  Result Date: 12/09/2018 CLINICAL DATA:  Cystoscopy and left retrograde pyelogram. Transitional cell carcinoma. EXAM: ABDOMEN - 1 VIEW; DG C-ARM 1-60 MIN-NO REPORT COMPARISON:  CT abdomen pelvis dated Nov 25, 2018. FLUOROSCOPY TIME:  Fluoroscopy Time:  1 minutes, 13 seconds. FINDINGS: Left retrograde pyelogram demonstrates mild irregularity of the lower pole collecting system. No hydronephrosis. Successful left double-J ureteral stent placement. IMPRESSION: 1. 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 2. Successful left double-J ureteral stent placement. Electronically Signed   By: Titus Dubin M.D.   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 Follow-up study. EXAM: CT CHEST, ABDOMEN, AND PELVIS WITH CONTRAST TECHNIQUE: Multidetector CT imaging of the chest, abdomen and pelvis was performed following the standard protocol during bolus administration of intravenous contrast. CONTRAST:  175m ISOVUE-300 IOPAMIDOL (ISOVUE-300) INJECTION 61% COMPARISON:  CT the chest, abdomen and pelvis 08/29/2018. FINDINGS: CT CHEST FINDINGS Cardiovascular: Heart size is normal. There is no significant pericardial fluid, thickening or pericardial calcification. There is aortic atherosclerosis, as well as atherosclerosis of the great vessels of the mediastinum and the  coronary arteries, including calcified atherosclerotic plaque in the left main, left anterior descending, left circumflex and right coronary arteries. Right internal jugular single-lumen porta cath with tip terminating in the distal superior vena cava. Mediastinum/Nodes: No pathologically enlarged mediastinal or hilar lymph nodes. Esophagus is unremarkable in appearance. No axillary lymphadenopathy. Lungs/Pleura: Widespread areas of mild septal thickening are noted throughout the lungs bilaterally, most evident throughout the mid to lower lungs, slightly progressive compared to the prior examination, and clearly progressive compared to more remote prior studies, concerning for interstitial lung disease. No acute consolidative airspace disease. No pleural effusions. No suspicious appearing pulmonary nodules or masses. Musculoskeletal: There are no aggressive appearing lytic or blastic lesions noted in the visualized portions of the skeleton. CT ABDOMEN PELVIS FINDINGS Hepatobiliary: No suspicious cystic or solid hepatic lesions. No intra or extrahepatic biliary ductal dilatation. Tiny calcified gallstone lying dependently in the gallbladder. No findings to suggest an associated acute cholecystitis at this time. Pancreas: Diffuse fatty atrophy throughout the pancreas. No pancreatic mass. No pancreatic ductal dilatation. No pancreatic or peripancreatic fluid or inflammatory changes. Spleen: Unremarkable. Adrenals/Urinary Tract: Previously identified infiltrative tumor involving the lower pole collecting system of the left kidney extending into the renal pelvis appears grossly similar to the prior examination. This is irregular in shape and therefore difficult to accurately measure, but is estimated to be approximately 1.8 x 3.0 x 1.9 cm (coronal image 92 of series 5 and axial image 68  of series 2). This is associated with decreased perfusion in the lower and interpolar region of the left kidney, as well as focal  calyceal dilatation in the interpolar region, similar to the prior study. Multiple other well-defined low-attenuation nonenhancing lesions in both kidneys are compatible with simple cysts, largest of which is exophytic in the upper pole of the right kidney measuring 3.8 cm in diameter. No hydroureteronephrosis. Urinary bladder is normal in appearance. Bilateral adrenal glands are normal in appearance. Stomach/Bowel: Normal appearance of the stomach. No pathologic dilatation of small bowel or colon. Normal appendix. Vascular/Lymphatic: Aortic atherosclerosis, without evidence of aneurysm or dissection in the abdominal or pelvic vasculature. Compared to the prior examination there has been interval worsening of left para-aortic lymphadenopathy adjacent to the renal hilum (axial image 70 of series 2) measuring up to 1.9 cm in short axis (previously 11 mm). No pelvic lymphadenopathy is noted. Reproductive: Prostate gland and seminal vesicles are unremarkable in appearance. Other: No significant volume of ascites.  No pneumoperitoneum. Musculoskeletal: There are no aggressive appearing lytic or blastic lesions noted in the visualized portions of the skeleton. IMPRESSION: 1. Although the primary lesion in the lower pole collecting system of the left kidney and left renal pelvis appears grossly stable in size compared to the prior examination, there is worsening left para-aortic lymphadenopathy immediately inferior to the left renal hilum, as detailed above, compatible with progressive nodal metastasis. No other definite sites of metastatic disease are noted elsewhere in the abdomen or pelvis. 2. Cholelithiasis without findings to suggest an acute cholecystitis at this time. 3. The appearance of the lungs is compatible with early but progressive interstitial lung disease. Outpatient referral to pulmonology for further evaluation is suggested. 4. Aortic atherosclerosis, in addition to left main and 3 vessel coronary artery  disease. Assessment for potential risk factor modification, dietary therapy or pharmacologic therapy may be warranted, if clinically indicated. 5. Additional incidental findings, as above. Electronically Signed   By: Vinnie Langton M.D.   On: 11/26/2018 09:00   Ct Abdomen Pelvis W Contrast  Result Date: 11/26/2018 CLINICAL DATA:  73 year old male with history of stage IV metastatic urothelial carcinoma with lymph node metastases. Follow-up study. EXAM: CT CHEST, ABDOMEN, AND PELVIS WITH CONTRAST TECHNIQUE: Multidetector CT imaging of the chest, abdomen and pelvis was performed following the standard protocol during bolus administration of intravenous contrast. CONTRAST:  181m ISOVUE-300 IOPAMIDOL (ISOVUE-300) INJECTION 61% COMPARISON:  CT the chest, abdomen and pelvis 08/29/2018. FINDINGS: CT CHEST FINDINGS Cardiovascular: Heart size is normal. There is no significant pericardial fluid, thickening or pericardial calcification. There is aortic atherosclerosis, as well as atherosclerosis of the great vessels of the mediastinum and the coronary arteries, including calcified atherosclerotic plaque in the left main, left anterior descending, left circumflex and right coronary arteries. Right internal jugular single-lumen porta cath with tip terminating in the distal superior vena cava. Mediastinum/Nodes: No pathologically enlarged mediastinal or hilar lymph nodes. Esophagus is unremarkable in appearance. No axillary lymphadenopathy. Lungs/Pleura: Widespread areas of mild septal thickening are noted throughout the lungs bilaterally, most evident throughout the mid to lower lungs, slightly progressive compared to the prior examination, and clearly progressive compared to more remote prior studies, concerning for interstitial lung disease. No acute consolidative airspace disease. No pleural effusions. No suspicious appearing pulmonary nodules or masses. Musculoskeletal: There are no aggressive appearing lytic or  blastic lesions noted in the visualized portions of the skeleton. CT ABDOMEN PELVIS FINDINGS Hepatobiliary: No suspicious cystic or solid hepatic lesions. No intra or  extrahepatic biliary ductal dilatation. Tiny calcified gallstone lying dependently in the gallbladder. No findings to suggest an associated acute cholecystitis at this time. Pancreas: Diffuse fatty atrophy throughout the pancreas. No pancreatic mass. No pancreatic ductal dilatation. No pancreatic or peripancreatic fluid or inflammatory changes. Spleen: Unremarkable. Adrenals/Urinary Tract: Previously identified infiltrative tumor involving the lower pole collecting system of the left kidney extending into the renal pelvis appears grossly similar to the prior examination. This is irregular in shape and therefore difficult to accurately measure, but is estimated to be approximately 1.8 x 3.0 x 1.9 cm (coronal image 92 of series 5 and axial image 68 of series 2). This is associated with decreased perfusion in the lower and interpolar region of the left kidney, as well as focal calyceal dilatation in the interpolar region, similar to the prior study. Multiple other well-defined low-attenuation nonenhancing lesions in both kidneys are compatible with simple cysts, largest of which is exophytic in the upper pole of the right kidney measuring 3.8 cm in diameter. No hydroureteronephrosis. Urinary bladder is normal in appearance. Bilateral adrenal glands are normal in appearance. Stomach/Bowel: Normal appearance of the stomach. No pathologic dilatation of small bowel or colon. Normal appendix. Vascular/Lymphatic: Aortic atherosclerosis, without evidence of aneurysm or dissection in the abdominal or pelvic vasculature. Compared to the prior examination there has been interval worsening of left para-aortic lymphadenopathy adjacent to the renal hilum (axial image 70 of series 2) measuring up to 1.9 cm in short axis (previously 11 mm). No pelvic lymphadenopathy is  noted. Reproductive: Prostate gland and seminal vesicles are unremarkable in appearance. Other: No significant volume of ascites.  No pneumoperitoneum. Musculoskeletal: There are no aggressive appearing lytic or blastic lesions noted in the visualized portions of the skeleton. IMPRESSION: 1. Although the primary lesion in the lower pole collecting system of the left kidney and left renal pelvis appears grossly stable in size compared to the prior examination, there is worsening left para-aortic lymphadenopathy immediately inferior to the left renal hilum, as detailed above, compatible with progressive nodal metastasis. No other definite sites of metastatic disease are noted elsewhere in the abdomen or pelvis. 2. Cholelithiasis without findings to suggest an acute cholecystitis at this time. 3. The appearance of the lungs is compatible with early but progressive interstitial lung disease. Outpatient referral to pulmonology for further evaluation is suggested. 4. 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 Assessment for potential risk factor modification, dietary therapy or pharmacologic therapy may be warranted, if clinically indicated. 5. Additional incidental findings, as above. Electronically Signed   By: Vinnie Langton M.D.   On: 11/26/2018 09:00   Ct Biopsy  Addendum Date: 12/17/2018   ADDENDUM REPORT: 12/17/2018 12:39 ADDENDUM: Dr. Reuel Derby from surgical pathology called regarding the core samples with concern that the total volume was inadequate for the needed study, estimating that 6 additional core samples would be needed. After discussion with the patient, he was brought back to the CT scanner. Again, Select axial scans through the mid abdomen were obtained. The left para-aortic adenopathy was localized and an appropriate skin entry site was determined and marked, proximal to previous entry site. The operative field was prepped with chlorhexidine in a sterile  fashion, and a sterile drape was applied covering the operative field. A sterile gown and sterile gloves were used for the procedure. Additional local anesthesia was provided with 1% Lidocaine. Under CT fluoroscopic guidance, a 17 gauge trocar needle was advanced to the margin of the left  para-aortic enlarged lymph node. Once needle tip position was confirmed, coaxial 18-gauge core biopsy samples x9 were obtained, submitted in formalin to surgical pathology. The guide needle was removed. Postprocedure scans show no hemorrhage or other apparent complication. The patient tolerated the procedure well. Electronically Signed   By: Lucrezia Europe M.D.   On: 12/17/2018 12:39   Result Date: 12/17/2018 : Alerts CLINICAL DATA:  Biopsy-proven urothelial carcinoma. Left para-aortic adenopathy. Additional tissue requested for needed molecular studies. EXAM: CT GUIDED CORE BIOPSY OF LEFT PARA-AORTIC ADENOPATHY ANESTHESIA/SEDATION: Intravenous Fentanyl 44mg and Versed '2mg'$  were administered as conscious sedation during continuous monitoring of the patient?s level of consciousness and physiological / cardiorespiratory status by the radiology RN, with a total moderate sedation time of 11 minutes. PROCEDURE: The procedure risks, benefits, and alternatives were explained to the patient. Questions regarding the procedure were encouraged and answered. The patient understands and consents to the procedure. Patient placed prone. Select axial scans through the mid abdomen were obtained. The left para-aortic adenopathy was localized and an appropriate skin entry site was determined and marked. The operative field was prepped with chlorhexidinein a sterile fashion, and a sterile drape was applied covering the operative field. A sterile gown and sterile gloves were used for the procedure. Local anesthesia was provided with 1% Lidocaine. Under CT fluoroscopic guidance, a 17 gauge trocar needle was advanced to the margin of the lesion. Once needle  tip position was confirmed, coaxial 18-gauge core biopsy samples were obtained, submitted in saline to surgical pathology. The guide needle was removed. Postprocedure scans show no hemorrhage or other apparent complication. The patient tolerated the procedure well. COMPLICATIONS: None immediate FINDINGS: The enlarged left para-aortic node was again localized. Representative core biopsy samples obtained as above. IMPRESSION: Technically successful CT-guided core biopsy, left para-aortic adenopathy. Electronically Signed: By: DLucrezia EuropeM.D. On: 12/17/2018 11:16   Dg C-arm 1-60 Min-no Report  Result Date: 12/09/2018 CLINICAL DATA:  Cystoscopy and left retrograde pyelogram. Transitional cell carcinoma. EXAM: ABDOMEN - 1 VIEW; DG C-ARM 1-60 MIN-NO REPORT COMPARISON:  CT abdomen pelvis dated Nov 25, 2018. FLUOROSCOPY TIME:  Fluoroscopy Time:  1 minutes, 13 seconds. FINDINGS: Left retrograde pyelogram demonstrates mild irregularity of the lower pole collecting system. No hydronephrosis. Successful left double-J ureteral stent placement. IMPRESSION: 1. 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 2. Successful left double-J ureteral stent placement. Electronically Signed   By: WTitus DubinM.D.   On: 12/09/2018 10:56     Assessment and plan- Patient is a 73y.o. 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    He is here for on treatment assessment prior to next cycle of Tecentriq  Patient finally underwent a successful CT-guided biopsy of the retroperitoneal lymph node and we do have enough sample for EGFR testing which has been sent out and the results are not back yet.  He will therefore proceed with the next cycle of Tecentriq today and I will see him back in 3 weeks.  Labs: CBC with differential, CMP, cortisol and TSH.  If he is found to have a EGFR mutation he would be a  candidate for Balversa.  If he does not have the mutation he would be a candidate for Padcev  1 L of IV fluids today   Visit Diagnosis 1. [place] cancer ([place]      Dr. ARanda Evens MD, MPH [facility]  [phone] 12/24/2018 11:38 AM

## 2018-12-24 NOTE — Progress Notes (Signed)
Patient would like a refill on his EMLA Cream.

## 2019-01-06 ENCOUNTER — Telehealth: Payer: Self-pay

## 2019-01-06 ENCOUNTER — Encounter: Payer: Self-pay | Admitting: Oncology

## 2019-01-06 ENCOUNTER — Other Ambulatory Visit: Payer: Self-pay | Admitting: Oncology

## 2019-01-06 DIAGNOSIS — C642 Malignant neoplasm of left kidney, except renal pelvis: Secondary | ICD-10-CM

## 2019-01-06 DIAGNOSIS — D701 Agranulocytosis secondary to cancer chemotherapy: Secondary | ICD-10-CM

## 2019-01-06 NOTE — Progress Notes (Signed)
DISCONTINUE OFF PATHWAY REGIMEN - Bladder   [id number]:Atezolizumab 1,200 mg q21 Days:   A cycle is every 21 days:     Atezolizumab   **Always confirm dose/schedule in your pharmacy ordering system**  REASON: Disease Progression PRIOR TREATMENT: Off Pathway: Atezolizumab 1,200 mg q21 Days TREATMENT RESPONSE: Progressive Disease (PD)  START ON PATHWAY REGIMEN - Bladder     A cycle is every 28 days:     Enfortumab vedotin-ejfv   **Always confirm dose/schedule in your pharmacy ordering system**  Patient Characteristics: Metastatic Disease, Third Line and Beyond Therapeutic Status: Metastatic Disease Line of Therapy: Third Line and Beyond  Intent of Therapy: Non-Curative / Palliative Intent, Discussed with Patient

## 2019-01-06 NOTE — Telephone Encounter (Signed)
Telephone call to patient's home, spoke with wife [name].  [name] in agreement with Telehealth visit 01/09/19 at 1:00 PM.

## 2019-01-09 ENCOUNTER — Other Ambulatory Visit: Payer: Medicare Other

## 2019-01-09 ENCOUNTER — Other Ambulatory Visit: Payer: Self-pay

## 2019-01-09 DIAGNOSIS — Z515 Encounter for palliative care: Secondary | ICD-10-CM

## 2019-01-09 NOTE — Progress Notes (Signed)
COMMUNITY PALLIATIVE CARE SW NOTE  PATIENT NAME: Patrick Mcgee DOB: 1945-10-25 MRN: [id number]  PRIMARY CARE PROVIDER: Jerrol Banana., MD  RESPONSIBLE PARTY:  Acct ID - Guarantor Home Phone Work Phone Relationship Acct Type  [id number] AYOMIKUN, STARLING [phone]  Self P/F     [place], [place], [place] [address]     PLAN OF CARE and INTERVENTIONS:             1. GOALS OF CARE/ ADVANCE CARE PLANNING:  Patient's goal is to continue with any treatment options. Discussion to continue with oncologist on Tuesday. Patient is a FULL CODE. 2. SOCIAL/EMOTIONAL/SPIRITUAL ASSESSMENT/ INTERVENTIONS:  Virgie, RN and SW met with patient and wife, Patrick Mcgee via [place]. Patient reports doing "okay". Patient had another biopsy performed and is supposed to get results on Tuesday to discuss options. Patient denies any pain, no nausea. Patient noted that he has lost weight and tries to eat two meals a day. Patient said he mostly snacks and does enjoy sweets. Patient has also started to drink protein shakes. Patient said he is sleeping good - naps during the day. Patient continues to feel "weak", reports decreased energy. Patient is hopeful that he can start exercising again soon. Discussed pacing himself with activity.  3. PATIENT/CAREGIVER EDUCATION/ COPING:  Patient and Patrick Mcgee seem to be coping well at this time. Patient was more cheerful on this visit versus previous visit. Patient and Patrick Mcgee enjoy going for car rides and spending time with family.  4. PERSONAL EMERGENCY PLAN:  Patient and family will call 9-1-1 for emergencies.Due to COVID-19 crisis, patient verbalized that he is remaining at home, other than doctor appointments. 5. COMMUNITY RESOURCES COORDINATION/ HEALTH CARE NAVIGATION:  Patient continues to do immunotherapy, next appointment is next Tuesday. Patient and Patrick Mcgee manage patient's care, no concerns.  6. FINANCIAL/LEGAL CONCERNS/INTERVENTIONS:  None at this time.     SOCIAL HX:   Social History   Tobacco Use  . Smoking status: Former Smoker    Types: Cigars    Quit date: 10/09/1978    Years since quitting: 40.2  . Smokeless tobacco: Former Systems developer    Types: Chew    Quit date: 12/04/1988  . Tobacco comment: on occasion  Substance Use Topics  . Alcohol use: Not Currently    CODE STATUS:   Code Status: Prior  ADVANCED DIRECTIVES: N MOST FORM COMPLETE:  No. HOSPICE EDUCATION PROVIDED: None.  PPS: Patient is independent of all ADLs.  Due to the COVID-19 crisis, this visit was done viaZoomfrom my office and it was initiated and consent by this patient and/or family.This was a scheduled visit.  I spent30mnutes with patient/family, from1:00-1:45pproviding education, support and consultation.   WMargaretmary Lombard LCSW

## 2019-01-09 NOTE — Progress Notes (Signed)
PATIENT NAME: Patrick Mcgee DOB: 10/17/1945 MRN: [id number]  PRIMARY CARE PROVIDER: Gilbert, Richard L Jr., MD  RESPONSIBLE PARTY:  Acct ID - Guarantor Home Phone Work Phone Relationship Acct Type  [id number] - Mcgee,Patrick [phone]  Self P/F     [address], [place], [place] [address]    PLAN OF CARE and INTERVENTIONS:               1.  GOALS OF CARE/ ADVANCE CARE PLANNING:  Patient wants to remain at home with wife Patrick Mcgee.  Patient is hopeful that he will be able to continue treatment.               2.  PATIENT/CAREGIVER EDUCATION:  Education on s/s of infection, education on foods to caloric intake.               3.  DISEASE STATUS: Patient is a 73 year old patient with bladder cancer.  Patient remains at home with his wife Patrick Mcgee. Due to the COVID-19 crisis, this visit was done via telemedicine from my office and it was initiated and consent by this patient and or family.  SW and RN met with patient and his wife Patrick Mcgee. Patient to see MD on Tuesday.  Patient had another biopsy of lymph nose via aspiration.  Patient continues to receive immunotherapy.  Patient states biopsy will determine if he is a candidate (match) for new treatment MD wants to try.  Patient denies pain but reports he feels weak and tired. Patient has had a 5 pound weight loss in a month.  Patient states he has not been exercising and feels short of breath on exertion.  Patient denies having any cough.  Patient reports he eats 2 decent meals a day and has added nutritional supplements to his diet.  Patient reports he continues to rest well and naps during the day. Education to patient to do what he feels like doing and not to over do.  Patient and wife continue to enjoy spending time with their granddaughter.  Patient has Not suffered any falls and has had no changes in current meds.  Wife reports patient is "more upbeat" after Palliative visits and verbalizes appreciation for palliative care services.  Patient and wife deny  having any needs at the present time.  Patient and wife informed to call with questions or concerns.               HISTORY OF PRESENT ILLNESS:    CODE STATUS: Full Code  ADVANCED DIRECTIVES: N MOST FORM: N PPS: 60%   PHYSICAL EXAM:   VITALS: No vital signs taken, telehealth visit  LUNGS: Shortness of breath on exertion, denies cough CARDIAC:  EXTREMITIES: none edema SKIN: Skin color, texture, turgor normal. No rashes or lesions  NEURO: Alert and oriented x 4         , RN 

## 2019-01-14 ENCOUNTER — Other Ambulatory Visit: Payer: Self-pay

## 2019-01-14 ENCOUNTER — Encounter: Payer: Self-pay | Admitting: Oncology

## 2019-01-14 ENCOUNTER — Inpatient Hospital Stay (HOSPITAL_BASED_OUTPATIENT_CLINIC_OR_DEPARTMENT_OTHER): Payer: Medicare Other | Admitting: Oncology

## 2019-01-14 ENCOUNTER — Inpatient Hospital Stay: Payer: Medicare Other | Attending: Oncology

## 2019-01-14 ENCOUNTER — Inpatient Hospital Stay: Payer: Medicare Other

## 2019-01-14 VITALS — BP 101/67 | HR 65 | Temp 97.6°F | Resp 18 | Wt 153.9 lb

## 2019-01-14 DIAGNOSIS — J849 Interstitial pulmonary disease, unspecified: Secondary | ICD-10-CM

## 2019-01-14 DIAGNOSIS — Z79899 Other long term (current) drug therapy: Secondary | ICD-10-CM | POA: Diagnosis not present

## 2019-01-14 DIAGNOSIS — C642 Malignant neoplasm of left kidney, except renal pelvis: Secondary | ICD-10-CM

## 2019-01-14 DIAGNOSIS — Z5112 Encounter for antineoplastic immunotherapy: Secondary | ICD-10-CM

## 2019-01-14 DIAGNOSIS — C772 Secondary and unspecified malignant neoplasm of intra-abdominal lymph nodes: Secondary | ICD-10-CM

## 2019-01-14 DIAGNOSIS — C652 Malignant neoplasm of left renal pelvis: Secondary | ICD-10-CM | POA: Insufficient documentation

## 2019-01-14 DIAGNOSIS — E538 Deficiency of other specified B group vitamins: Secondary | ICD-10-CM | POA: Diagnosis not present

## 2019-01-14 DIAGNOSIS — E119 Type 2 diabetes mellitus without complications: Secondary | ICD-10-CM

## 2019-01-14 DIAGNOSIS — Z7189 Other specified counseling: Secondary | ICD-10-CM

## 2019-01-14 DIAGNOSIS — I2510 Atherosclerotic heart disease of native coronary artery without angina pectoris: Secondary | ICD-10-CM | POA: Diagnosis not present

## 2019-01-14 DIAGNOSIS — C689 Malignant neoplasm of urinary organ, unspecified: Secondary | ICD-10-CM

## 2019-01-14 DIAGNOSIS — D701 Agranulocytosis secondary to cancer chemotherapy: Secondary | ICD-10-CM

## 2019-01-14 DIAGNOSIS — Z5111 Encounter for antineoplastic chemotherapy: Secondary | ICD-10-CM

## 2019-01-14 LAB — CBC WITH DIFFERENTIAL/PLATELET
Abs Immature Granulocytes: 0.02 10*3/uL (ref 0.00–0.07)
Basophils Absolute: 0.1 10*3/uL (ref 0.0–0.1)
Basophils Relative: 1 %
Eosinophils Absolute: 0.7 10*3/uL — ABNORMAL HIGH (ref 0.0–0.5)
Eosinophils Relative: 8 %
HCT: 33 % — ABNORMAL LOW (ref 39.0–52.0)
Hemoglobin: 10.8 g/dL — ABNORMAL LOW (ref 13.0–17.0)
Immature Granulocytes: 0 %
Lymphocytes Relative: 13 %
Lymphs Abs: 1.1 10*3/uL (ref 0.7–4.0)
MCH: 28.9 pg (ref 26.0–34.0)
MCHC: 32.7 g/dL (ref 30.0–36.0)
MCV: 88.2 fL (ref 80.0–100.0)
Monocytes Absolute: 0.8 10*3/uL (ref 0.1–1.0)
Monocytes Relative: 9 %
Neutro Abs: 6.3 10*3/uL (ref 1.7–7.7)
Neutrophils Relative %: 69 %
Platelets: 213 10*3/uL (ref 150–400)
RBC: 3.74 MIL/uL — ABNORMAL LOW (ref 4.22–5.81)
RDW: 14.4 % (ref 11.5–15.5)
WBC: 9.1 10*3/uL (ref 4.0–10.5)
nRBC: 0 % (ref 0.0–0.2)

## 2019-01-14 LAB — COMPREHENSIVE METABOLIC PANEL
ALT: 12 U/L (ref 0–44)
AST: 17 U/L (ref 15–41)
Albumin: 3.6 g/dL (ref 3.5–5.0)
Alkaline Phosphatase: 93 U/L (ref 38–126)
Anion gap: 10 (ref 5–15)
BUN: 24 mg/dL — ABNORMAL HIGH (ref 8–23)
CO2: 27 mmol/L (ref 22–32)
Calcium: 9.5 mg/dL (ref 8.9–10.3)
Chloride: 100 mmol/L (ref 98–111)
Creatinine, Ser: 0.78 mg/dL (ref 0.61–1.24)
GFR calc Af Amer: >60 mL/min (ref >60)
GFR calc non Af Amer: >60 mL/min (ref >60)
Glucose, Bld: 162 mg/dL — ABNORMAL HIGH (ref 70–99)
Potassium: 4.2 mmol/L (ref 3.5–5.1)
Sodium: 137 mmol/L (ref 135–145)
Total Bilirubin: 0.8 mg/dL (ref 0.3–1.2)
Total Protein: 7.2 g/dL (ref 6.5–8.1)

## 2019-01-14 LAB — CORTISOL: Cortisol, Plasma: 22.9 ug/dL

## 2019-01-14 LAB — TSH: TSH: 4.405 u[IU]/mL (ref 0.350–4.500)

## 2019-01-14 MED ORDER — SODIUM CHLORIDE 0.9 % IV SOLN
Freq: Once | INTRAVENOUS | Status: AC
  Administered 2019-01-14: 10:00:00 via INTRAVENOUS
  Filled 2019-01-14: qty 250

## 2019-01-14 MED ORDER — HEPARIN SOD (PORK) LOCK FLUSH 100 UNIT/ML IV SOLN
500.0000 [IU] | Freq: Once | INTRAVENOUS | Status: AC
  Administered 2019-01-14: 12:00:00 500 [IU] via INTRAVENOUS
  Filled 2019-01-14: qty 5

## 2019-01-14 MED ORDER — PALONOSETRON HCL INJECTION 0.25 MG/5ML
0.2500 mg | Freq: Once | INTRAVENOUS | Status: AC
  Administered 2019-01-14: 0.25 mg via INTRAVENOUS
  Filled 2019-01-14: qty 5

## 2019-01-14 MED ORDER — DEXAMETHASONE SODIUM PHOSPHATE 10 MG/ML IJ SOLN
10.0000 mg | Freq: Once | INTRAMUSCULAR | Status: AC
  Administered 2019-01-14: 11:00:00 10 mg via INTRAVENOUS
  Filled 2019-01-14: qty 1

## 2019-01-14 MED ORDER — SODIUM CHLORIDE 0.9% FLUSH
10.0000 mL | Freq: Once | INTRAVENOUS | Status: AC
  Administered 2019-01-14: 09:00:00 10 mL via INTRAVENOUS
  Filled 2019-01-14: qty 10

## 2019-01-14 MED ORDER — SODIUM CHLORIDE 0.9 % IV SOLN
1.0000 mg/kg | Freq: Once | INTRAVENOUS | Status: AC
  Administered 2019-01-14: 11:00:00 70 mg via INTRAVENOUS
  Filled 2019-01-14: qty 7

## 2019-01-14 NOTE — Progress Notes (Signed)
Pt in for follow up, reports continued weight loss and decreased appetite.

## 2019-01-14 NOTE — Progress Notes (Signed)
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 [name] so far for his metastatic urothelial carcinoma. This was originally diagnosed in May 2019. He was noted to have a filling defect in the lower pole collecting system of the left kidney along with para-aortic and retroperitoneal adenopathy concerning for metastatic disease. He underwent left ureteroscopy and renal pelvis biopsy which revealed small fragments of high-grade urothelial carcinoma with small focus of invasion. He was started on carboplatin and gemcitabine chemotherapy in June 2019 and was continued on 05/27/2018 for 8 cycles. He tolerated chemotherapy well except for chemo-induced anemia for which she has been getting Procrit every 2 weeks. He did have response to his disease based on scans in August 2019. However repeat scan on 05/31/2018 showed increase in the size of the primary tumor from 1.2 to 1.9 cm and increase in left para-aortic adenopathy from 0.9 to 1.3 cm. Periportal adenopathy was stable at 1.4 cm. Second line immunotherapy was recommended. His initial biopsy specimen did not have enough sample to undergo FGFR  mutation testing. He also has mild interstitial lung disease for which he sees pulmonary but he is not on home oxygen. He has B12 deficiency for which he is on oral B12. Also has diabetes and coronary artery disease.Tecentriq started on 06/17/2018.  Patient noted to have disease progression in his lymph nodes in May 2020.  Repeat biopsy showed metastatic urothelial carcinoma which did not have a GFR mutation.  He has been started on third line Padcev   Interval history-he feels fatigued.  Denies any abdominal pain or diarrhea.  There are times when he spends a lot of time in bed because of 15  ECOG PS- 1 Pain scale- 0 Opioid associated constipation- no  Review of systems- Review of Systems  Constitutional: Positive for malaise/fatigue. Negative for chills, fever and weight loss.  HENT: Negative for congestion, ear discharge and nosebleeds.   Eyes: Negative for blurred vision.  Respiratory: Negative for cough, hemoptysis, sputum production, shortness of breath and wheezing.   Cardiovascular: Negative for chest pain, palpitations, orthopnea and claudication.  Gastrointestinal: Negative for abdominal pain, blood in stool, constipation, diarrhea, heartburn, melena, nausea and vomiting.  Genitourinary: Negative for dysuria, flank pain, frequency, hematuria and urgency.  Musculoskeletal: Negative for back pain, joint pain and myalgias.  Skin: Negative for rash.  Neurological: Negative for dizziness, tingling, focal weakness, seizures, weakness and headaches.  Endo/Heme/Allergies: Does not bruise/bleed easily.  Psychiatric/Behavioral: Negative for depression and suicidal ideas. The patient does not have insomnia.       Allergies  Allergen Reactions   Sulfa Antibiotics Other (See Comments)    Joint pain   Ace Inhibitors Cough   Invokana [Canagliflozin] Other (See Comments)    Leg pain   Penicillins Rash    Did it involve swelling of the face/tongue/throat,  SOB, or low BP? No Did it  involve sudden or severe rash/hives, skin peeling, or any reaction on the inside of your mouth or nose? Yes Did you need to seek medical attention at a hospital or doctor's office? Yes When did it last happen?childhood allergy If all above answers are NO, may proceed with cephalosporin use.      Past Medical History:  Diagnosis Date   Acid reflux    Anxiety    Depression    Diabetes mellitus without complication ([place])    Hyperlipidemia    Hypertension    MRSA (methicillin resistant Staphylococcus aureus) infection 1992   history   Myocardial infarction ([place]) 1995   Sleep apnea    CPAP   Urothelial cancer ([place]) 11/2017   Left Urothelial mass, chemo tx's     Past Surgical History:  Procedure Laterality Date   CATARACT EXTRACTION Left    COLONOSCOPY  2010   [facility]   COLONOSCOPY WITH PROPOFOL N/A 10/04/2016   Procedure: COLONOSCOPY WITH PROPOFOL;  Surgeon: [name], MD;  Location: [facility];  Service: Endoscopy;  Laterality: N/A;   [place], 2000, 2001, 2014   CYSTOSCOPY W/ RETROGRADES Bilateral 12/10/2017   Procedure: CYSTOSCOPY WITH RETROGRADE PYELOGRAM;  Surgeon: [name], MD;  Location: [facility];  Service: Urology;  Laterality: Bilateral;   CYSTOSCOPY W/ RETROGRADES Left 12/09/2018   Procedure: CYSTOSCOPY WITH RETROGRADE PYELOGRAM;  Surgeon: [name], MD;  Location: [facility];  Service: Urology;  Laterality: Left;   CYSTOSCOPY WITH BIOPSY Left 12/09/2018   Procedure: CYSTOSCOPY WITH URETERAL/RENAL PELVIC BIOPSY;  Surgeon: [name], MD;  Location: [facility];  Service: Urology;  Laterality: Left;   CYSTOSCOPY WITH STENT PLACEMENT Left 12/10/2017   Procedure: CYSTOSCOPY WITH STENT PLACEMENT;  Surgeon: [name], MD;  Location: [facility];  Service: Urology;  Laterality: Left;   CYSTOSCOPY WITH STENT PLACEMENT Left 12/09/2018   Procedure: CYSTOSCOPY WITH STENT PLACEMENT;  Surgeon:  [name], MD;  Location: [facility];  Service: Urology;  Laterality: Left;   CYSTOSCOPY WITH URETEROSCOPY Left 12/09/2018   Procedure: CYSTOSCOPY WITH URETEROSCOPY;  Surgeon: [name], MD;  Location: [facility];  Service: Urology;  Laterality: Left;   EYE SURGERY Bilateral    cataract   INGUINAL HERNIA REPAIR Right 08/09/2015   Procedure: HERNIA REPAIR INGUINAL ADULT;  Surgeon: [name], MD;  Location: [facility];  Service: General;  Laterality: Right;   NASAL SINUS SURGERY     nuclear stress test     PORTA CATH INSERTION N/A 12/19/2017   Procedure: PORTA CATH INSERTION;  Surgeon: [name], MD;  Location: [place] [facility];  Service: Cardiovascular;  Laterality: N/A;   URETERAL BIOPSY Left 12/10/2017   Procedure: URETERAL & renal PELVIS BIOPSY;  Surgeon: [name], MD;  Location: [facility];  Service: Urology;  Laterality: Left;   URETEROSCOPY Left 12/10/2017   Procedure: URETEROSCOPY;  Surgeon: [name], MD;  Location: [facility];  Service: Urology;  Laterality: Left;    Social History   Socioeconomic History   Marital status: Married    Spouse name: [name]   Number of children: 1   Years of education: Not on file   Highest education level: Associate degree: occupational, Hotel manager, or vocational program  Occupational History   Occupation: retired  Scientist, product/process development strain: Not hard at [employer]    Worry: Never true    Inability: Never true  Transportation needs    Medical: No    Non-medical: No  Tobacco Use   Smoking status: Former Smoker    Types: Cigars    Quit date: 10/09/1978    Years since quitting: 40.2   Smokeless tobacco: Former Systems developer    Types: Chew    Quit date: 12/04/1988   Tobacco comment: on occasion  Substance and Sexual Activity   Alcohol use: Not Currently   Drug use: No   Sexual activity: Not Currently  Lifestyle   Physical activity    Days per week: 0 days    Minutes per  session: 0 min   Stress: Only a little  Relationships   Social connections    Talks on phone: More than three times a week    Gets together: More than three times a week    Attends religious service: More than 4 times per year    Active member of club or organization: Not on file    Attends meetings of clubs or organizations: Not on file    Relationship status: Married   Intimate partner violence    Fear of current or ex partner: No    Emotionally abused: No    Physically abused: No    Forced sexual activity: No  Other Topics Concern   Not on file  Social History Narrative   Not on file    Family History  Problem Relation Age of Onset   Heart disease Mother    Cancer Father        Lung and colon cancer   Heart disease Father    Emphysema Maternal Grandfather    Tuberculosis Maternal Grandmother      Current Outpatient Medications:    aspirin EC 81 MG tablet, Take 81 mg by mouth every evening. , Disp: , Rfl:    atorvastatin (LIPITOR) 40 MG tablet, Take 1 tablet (40 mg total) by mouth at bedtime., Disp: 90 tablet, Rfl: 3   carvedilol (COREG) 12.5 MG tablet, TAKE 1 TABLET BY MOUTH TWICE DAILY (Patient taking differently: Take 12.5 mg by mouth 2 (two) times a day. ), Disp: 180 tablet, Rfl: 3   clobetasol cream (TEMOVATE) 6.83 %, Apply 1 application topically as needed (for skin rash)., Disp: 30 g, Rfl: 1   diphenhydrAMINE (BENADRYL) 25 MG tablet, Take 25 mg by mouth daily as needed for allergies., Disp: , Rfl:    empagliflozin (JARDIANCE) 10 MG TABS tablet, Take 10 mg by mouth daily. (Patient taking differently: Take 10 mg by mouth daily as needed (if blood sugar is over 200). ), Disp: 30 tablet, Rfl: 11   feeding supplement, ENSURE ENLIVE, (ENSURE ENLIVE) LIQD, Take 237 mLs by mouth 3 (three) times daily between meals. (Patient taking differently: Take 237 mLs by mouth 2 (two) times a week. ), Disp: 90 Bottle, Rfl: 0   fluticasone (FLONASE) 50 MCG/ACT nasal  spray, Use 2 spray(s) in each nostril once daily (Patient taking differently: Place 2 sprays into both nostrils daily. ), Disp: 48 g, Rfl: 3   glucose blood (CONTOUR NEXT TEST) test strip, Check blood sugars 3 times daily., Disp: 300 each, Rfl: 11   hydrOXYzine (ATARAX/VISTARIL) 25 MG tablet, Take 1 tablet (25 mg total) by mouth every 8 (eight) hours as needed for itching., Disp: 30 tablet, Rfl: 3   Ivermectin (SOOLANTRA) 1 % CREA, Apply 1 application topically daily as needed (rosacea). To face, Disp: , Rfl:    lidocaine-prilocaine (EMLA) cream, Apply 1 application topically as needed (port  access)., Disp: 1 g, Rfl: 3   losartan (COZAAR) 100 MG tablet, TAKE 1 TABLET BY MOUTH ONCE DAILY (Patient taking differently: Take 100 mg by mouth at bedtime. ), Disp: 90 tablet, Rfl: 3   magnesium hydroxide (MILK OF MAGNESIA) 400 MG/5ML suspension, Take 15 mLs by mouth daily as needed for mild constipation., Disp: , Rfl:    metFORMIN (GLUCOPHAGE) 1000 MG tablet, TAKE 1 TABLET BY MOUTH TWICE DAILY WITH MEALS (Patient taking differently: Take 1,000 mg by mouth 2 (two) times a day. ), Disp: 180 tablet, Rfl: 3   nystatin cream (MYCOSTATIN), Apply topically 2 (two) times daily. (Patient taking differently: Apply 1 application topically 2 (two) times daily as needed (rash). ), Disp: 15 g, Rfl: 1   sertraline (ZOLOFT) 100 MG tablet, Take 1 tablet (100 mg total) by mouth daily., Disp: 90 tablet, Rfl: 3   vitamin B-12 (CYANOCOBALAMIN) 1000 MCG tablet, Take 1,000 mcg by mouth daily., Disp: , Rfl:    vitamin C (ASCORBIC ACID) 500 MG tablet, Take 1,000-1,500 mg by mouth daily as needed (immune support)., Disp: , Rfl:    HYDROcodone-acetaminophen (NORCO/VICODIN) 5-325 MG tablet, Take 1-2 tablets by mouth every 6 (six) hours as needed for moderate pain. (Patient not taking: Reported on 01/14/2019), Disp: 6 tablet, Rfl: 0   OVER THE COUNTER MEDICATION, Apply 1 application topically daily as needed (pain). Outback  topical pain relief, Disp: , Rfl:    oxybutynin (DITROPAN) 5 MG tablet, Take 1 tablet (5 mg total) by mouth every 8 (eight) hours as needed for bladder spasms. (Patient not taking: Reported on 01/14/2019), Disp: 30 tablet, Rfl: 0   tamsulosin (FLOMAX) 0.4 MG CAPS capsule, Take 1 capsule (0.4 mg total) by mouth daily. (Patient not taking: Reported on 01/14/2019), Disp: 14 capsule, Rfl: 0 No current facility-administered medications for this visit.   Facility-Administered Medications Ordered in Other Visits:    enfortumab vedotin-ejfv (PADCEV) 70 mg in sodium chloride 0.9 % 50 mL (1.2281 mg/mL) chemo infusion, 1 mg/kg (Treatment Plan Recorded), Intravenous, Once, Sindy Guadeloupe, MD, Last Rate: 114 mL/hr at 01/14/19 1114, 70 mg at 01/14/19 1114   [COMPLETED] heparin lock flush 100 unit/mL, 500 Units, Intravenous, Once, Sindy Guadeloupe, MD, 500 Units at 01/14/19 1150   Influenza vac split quadrivalent PF (FLUZONE HIGH-DOSE) injection 0.5 mL, 0.5 mL, Intramuscular, Once, Karen Kitchens, NP  Physical exam:  Vitals:   01/14/19 0930  BP: 101/67  Pulse: 65  Resp: 18  Temp: 97.6 F (36.4 C)  TempSrc: Tympanic  Weight: 153 lb 14.4 oz (69.8 kg)   Physical Exam Constitutional:      Comments: Elderly thin gentleman who appears fatigued.  No acute distress  HENT:     Head: Normocephalic and atraumatic.  Eyes:     Pupils: Pupils are equal, round, and reactive to light.  Neck:     Musculoskeletal: Normal range of motion.  Cardiovascular:     Rate and Rhythm: Normal rate and regular rhythm.     Heart sounds: Normal heart sounds.  Pulmonary:     Effort: Pulmonary effort is normal.     Breath sounds: Normal breath sounds.  Abdominal:     General: Bowel sounds are normal.     Palpations: Abdomen is soft.  Skin:    General: Skin is warm and dry.  Neurological:     Mental Status: He is alert and oriented to person, place, and time.      CMP Latest Ref Rng & Units 01/14/2019  Glucose 70 - 99  mg/dL 162(H)  BUN 8 - 23 mg/dL 24(H)  Creatinine 0.61 - 1.24 mg/dL 0.78  Sodium 135 - 145 mmol/L 137  Potassium 3.5 - 5.1 mmol/L 4.2  Chloride 98 - 111 mmol/L 100  CO2 22 - 32 mmol/L 27  Calcium 8.9 - 10.3 mg/dL 9.5  Total Protein 6.5 - 8.1 g/dL 7.2  Total Bilirubin 0.3 - 1.2 mg/dL 0.8  Alkaline Phos 38 - 126 U/L 93  AST 15 - 41 U/L 17  ALT 0 - 44 U/L 12   CBC Latest Ref Rng & Units 01/14/2019  WBC 4.0 - 10.5 K/uL 9.1  Hemoglobin 13.0 - 17.0 g/dL 10.8(L)  Hematocrit 39.0 - 52.0 % 33.0(L)  Platelets 150 - 400 K/uL 213    No images are attached to the encounter.  Ct Biopsy  Addendum Date: 12/17/2018   ADDENDUM REPORT: 12/17/2018 12:39 ADDENDUM: Dr. Reuel Derby from surgical pathology called regarding the core samples with concern that the total volume was inadequate for the needed study, estimating that 6 additional core samples would be needed. After discussion with the patient, he was brought back to the CT scanner. Again, Select axial scans through the mid abdomen were obtained. The left para-aortic adenopathy was localized and an appropriate skin entry site was determined and marked, proximal to previous entry site. The operative field was prepped with chlorhexidine in a sterile fashion, and a sterile drape was applied covering the operative field. A sterile gown and sterile gloves were used for the procedure. Additional local anesthesia was provided with 1% Lidocaine. Under CT fluoroscopic guidance, a 17 gauge trocar needle was advanced to the margin of the left para-aortic enlarged lymph node. Once needle tip position was confirmed, coaxial 18-gauge core biopsy samples x9 were obtained, submitted in formalin to surgical pathology. The guide needle was removed. Postprocedure scans show no hemorrhage or other apparent complication. The patient tolerated the procedure well. Electronically Signed   By: Lucrezia Europe M.D.   On: 12/17/2018 12:39   Result Date: 12/17/2018 : Alerts CLINICAL DATA:   Biopsy-proven urothelial carcinoma. Left para-aortic adenopathy. Additional tissue requested for needed molecular studies. EXAM: CT GUIDED CORE BIOPSY OF LEFT PARA-AORTIC ADENOPATHY ANESTHESIA/SEDATION: Intravenous Fentanyl 29mg and Versed '2mg'$  were administered as conscious sedation during continuous monitoring of the patient?s level of consciousness and physiological / cardiorespiratory status by the radiology RN, with a total moderate sedation time of 11 minutes. PROCEDURE: The procedure risks, benefits, and alternatives were explained to the patient. Questions regarding the procedure were encouraged and answered. The patient understands and consents to the procedure. Patient placed prone. Select axial scans through the mid abdomen were obtained. The left para-aortic adenopathy was localized and an appropriate skin entry site was determined and marked. The operative field was prepped with chlorhexidinein a sterile fashion, and a sterile drape was applied covering the operative field. A sterile gown and sterile gloves were used for the procedure. Local anesthesia was provided with 1% Lidocaine. Under CT fluoroscopic guidance, a 17 gauge trocar needle was advanced to the margin of the lesion. Once needle tip position was confirmed, coaxial 18-gauge core biopsy samples were obtained, submitted in saline to surgical pathology. The guide needle was removed. Postprocedure scans show no hemorrhage or other apparent complication. The patient tolerated the procedure well. COMPLICATIONS: None immediate FINDINGS: The enlarged left para-aortic node was again localized. Representative core biopsy samples obtained as above. IMPRESSION: Technically successful CT-guided core biopsy, left para-aortic adenopathy. Electronically Signed: By: DKeturah Barre  Vernard Gambles M.D. On: 12/17/2018 11:16     Assessment and plan- Patient is a 73 y.o. 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   He had disease  progression on carboplatin/gemcitabine as well as second line Tecentriq.  He is here for on treatment assessment prior to cycle 1 of Padcev  Repeat biopsy did not reveal any evidence of EGFR mutation.  He is therefore not a candidate for [place].  He will proceed with cycle 1 day 1 of Padcev today.  Discussed risks and benefits of Padcev including all but not limited to nausea, vomiting, diarrhea, low blood counts, peripheral neuropathy and skin rash.  Treatment will be given with a palliative intent and will be given 3 weeks on and one-week off.  Given his baseline anemia as well as age and frailty I will reduce his dose to 1 mg per metered square.  He will directly proceed for cycle 1 day 8 of treatment next week and I will see him back in 2 weeks for cycle 1 day 15.  CBC with differential, CMP to be done each week.  Fatigue: Likely secondary to underlying malignancy.  TSH is normal.  Cortisol is pending   Visit Diagnosis 1. Encounter for antineoplastic chemotherapy   2. Urothelial carcinoma of kidney, left ([place])   3. Encounter for monoclonal antibody treatment for malignancy   4. Goals of care, counseling/discussion      Dr. Randa Evens, MD, MPH [facility] [phone] 01/14/2019 11:25 AM

## 2019-01-20 ENCOUNTER — Other Ambulatory Visit: Payer: Self-pay

## 2019-01-21 ENCOUNTER — Inpatient Hospital Stay: Payer: Medicare Other

## 2019-01-21 ENCOUNTER — Other Ambulatory Visit: Payer: Self-pay

## 2019-01-21 VITALS — BP 109/71 | HR 73 | Temp 96.0°F | Resp 17 | Wt 154.2 lb

## 2019-01-21 DIAGNOSIS — D701 Agranulocytosis secondary to cancer chemotherapy: Secondary | ICD-10-CM

## 2019-01-21 DIAGNOSIS — C642 Malignant neoplasm of left kidney, except renal pelvis: Secondary | ICD-10-CM

## 2019-01-21 DIAGNOSIS — Z5112 Encounter for antineoplastic immunotherapy: Secondary | ICD-10-CM | POA: Diagnosis not present

## 2019-01-21 DIAGNOSIS — C772 Secondary and unspecified malignant neoplasm of intra-abdominal lymph nodes: Secondary | ICD-10-CM | POA: Diagnosis not present

## 2019-01-21 DIAGNOSIS — Z79899 Other long term (current) drug therapy: Secondary | ICD-10-CM | POA: Diagnosis not present

## 2019-01-21 DIAGNOSIS — C652 Malignant neoplasm of left renal pelvis: Secondary | ICD-10-CM | POA: Diagnosis not present

## 2019-01-21 LAB — CBC WITH DIFFERENTIAL/PLATELET
Abs Immature Granulocytes: 0.02 10*3/uL (ref 0.00–0.07)
Basophils Absolute: 0 10*3/uL (ref 0.0–0.1)
Basophils Relative: 1 %
Eosinophils Absolute: 0.5 10*3/uL (ref 0.0–0.5)
Eosinophils Relative: 6 %
HCT: 33.5 % — ABNORMAL LOW (ref 39.0–52.0)
Hemoglobin: 10.9 g/dL — ABNORMAL LOW (ref 13.0–17.0)
Immature Granulocytes: 0 %
Lymphocytes Relative: 16 %
Lymphs Abs: 1.2 10*3/uL (ref 0.7–4.0)
MCH: 28.6 pg (ref 26.0–34.0)
MCHC: 32.5 g/dL (ref 30.0–36.0)
MCV: 87.9 fL (ref 80.0–100.0)
Monocytes Absolute: 0.7 10*3/uL (ref 0.1–1.0)
Monocytes Relative: 10 %
Neutro Abs: 5 10*3/uL (ref 1.7–7.7)
Neutrophils Relative %: 67 %
Platelets: 232 10*3/uL (ref 150–400)
RBC: 3.81 MIL/uL — ABNORMAL LOW (ref 4.22–5.81)
RDW: 14.3 % (ref 11.5–15.5)
WBC: 7.5 10*3/uL (ref 4.0–10.5)
nRBC: 0 % (ref 0.0–0.2)

## 2019-01-21 LAB — COMPREHENSIVE METABOLIC PANEL
ALT: 12 U/L (ref 0–44)
AST: 16 U/L (ref 15–41)
Albumin: 3.4 g/dL — ABNORMAL LOW (ref 3.5–5.0)
Alkaline Phosphatase: 79 U/L (ref 38–126)
Anion gap: 10 (ref 5–15)
BUN: 13 mg/dL (ref 8–23)
CO2: 28 mmol/L (ref 22–32)
Calcium: 9.3 mg/dL (ref 8.9–10.3)
Chloride: 99 mmol/L (ref 98–111)
Creatinine, Ser: 0.56 mg/dL — ABNORMAL LOW (ref 0.61–1.24)
GFR calc Af Amer: >60 mL/min (ref >60)
GFR calc non Af Amer: >60 mL/min (ref >60)
Glucose, Bld: 226 mg/dL — ABNORMAL HIGH (ref 70–99)
Potassium: 4.1 mmol/L (ref 3.5–5.1)
Sodium: 137 mmol/L (ref 135–145)
Total Bilirubin: 0.9 mg/dL (ref 0.3–1.2)
Total Protein: 7.1 g/dL (ref 6.5–8.1)

## 2019-01-21 MED ORDER — SODIUM CHLORIDE 0.9% FLUSH
10.0000 mL | Freq: Once | INTRAVENOUS | Status: AC
  Administered 2019-01-21: 09:00:00 10 mL via INTRAVENOUS
  Filled 2019-01-21: qty 10

## 2019-01-21 MED ORDER — DEXAMETHASONE SODIUM PHOSPHATE 10 MG/ML IJ SOLN
10.0000 mg | Freq: Once | INTRAMUSCULAR | Status: AC
  Administered 2019-01-21: 10:00:00 10 mg via INTRAVENOUS
  Filled 2019-01-21: qty 1

## 2019-01-21 MED ORDER — SODIUM CHLORIDE 0.9 % IV SOLN
Freq: Once | INTRAVENOUS | Status: AC
  Administered 2019-01-21: 10:00:00 via INTRAVENOUS
  Filled 2019-01-21: qty 250

## 2019-01-21 MED ORDER — SODIUM CHLORIDE 0.9 % IV SOLN
1.0000 mg/kg | Freq: Once | INTRAVENOUS | Status: AC
  Administered 2019-01-21: 10:00:00 70 mg via INTRAVENOUS
  Filled 2019-01-21: qty 7

## 2019-01-21 MED ORDER — PALONOSETRON HCL INJECTION 0.25 MG/5ML
0.2500 mg | Freq: Once | INTRAVENOUS | Status: AC
  Administered 2019-01-21: 0.25 mg via INTRAVENOUS
  Filled 2019-01-21: qty 5

## 2019-01-21 MED ORDER — HEPARIN SOD (PORK) LOCK FLUSH 100 UNIT/ML IV SOLN
500.0000 [IU] | Freq: Once | INTRAVENOUS | Status: AC | PRN
  Administered 2019-01-21: 11:00:00 500 [IU]
  Filled 2019-01-21: qty 5

## 2019-01-22 ENCOUNTER — Telehealth: Payer: Self-pay

## 2019-01-22 NOTE — Telephone Encounter (Signed)
Pt is needing a new face make for his CPAP machine.  He states the company is used to go to is no longer in business.  He would like an order for a new mask sent local so he can go in and try on the face masks.   Contact number is [phone]   Thanks,   -Mickel Baas

## 2019-01-23 NOTE — Telephone Encounter (Signed)
Ok to order    thanks

## 2019-01-28 ENCOUNTER — Inpatient Hospital Stay: Payer: Medicare Other

## 2019-01-28 ENCOUNTER — Other Ambulatory Visit: Payer: Self-pay

## 2019-01-28 ENCOUNTER — Encounter: Payer: Self-pay | Admitting: Oncology

## 2019-01-28 ENCOUNTER — Inpatient Hospital Stay (HOSPITAL_BASED_OUTPATIENT_CLINIC_OR_DEPARTMENT_OTHER): Payer: Medicare Other | Admitting: Oncology

## 2019-01-28 VITALS — BP 96/63 | HR 64 | Temp 95.8°F | Resp 18 | Wt 155.1 lb

## 2019-01-28 DIAGNOSIS — C772 Secondary and unspecified malignant neoplasm of intra-abdominal lymph nodes: Secondary | ICD-10-CM | POA: Diagnosis not present

## 2019-01-28 DIAGNOSIS — C652 Malignant neoplasm of left renal pelvis: Secondary | ICD-10-CM | POA: Diagnosis not present

## 2019-01-28 DIAGNOSIS — Z5112 Encounter for antineoplastic immunotherapy: Secondary | ICD-10-CM | POA: Diagnosis not present

## 2019-01-28 DIAGNOSIS — Z79899 Other long term (current) drug therapy: Secondary | ICD-10-CM | POA: Diagnosis not present

## 2019-01-28 DIAGNOSIS — Z87891 Personal history of nicotine dependence: Secondary | ICD-10-CM

## 2019-01-28 DIAGNOSIS — D701 Agranulocytosis secondary to cancer chemotherapy: Secondary | ICD-10-CM

## 2019-01-28 DIAGNOSIS — C642 Malignant neoplasm of left kidney, except renal pelvis: Secondary | ICD-10-CM

## 2019-01-28 DIAGNOSIS — R5382 Chronic fatigue, unspecified: Secondary | ICD-10-CM | POA: Diagnosis not present

## 2019-01-28 DIAGNOSIS — G620 Drug-induced polyneuropathy: Secondary | ICD-10-CM

## 2019-01-28 LAB — CBC WITH DIFFERENTIAL/PLATELET
Abs Immature Granulocytes: 0.01 10*3/uL (ref 0.00–0.07)
Basophils Absolute: 0.1 10*3/uL (ref 0.0–0.1)
Basophils Relative: 1 %
Eosinophils Absolute: 0.6 10*3/uL — ABNORMAL HIGH (ref 0.0–0.5)
Eosinophils Relative: 8 %
HCT: 33.1 % — ABNORMAL LOW (ref 39.0–52.0)
Hemoglobin: 10.8 g/dL — ABNORMAL LOW (ref 13.0–17.0)
Immature Granulocytes: 0 %
Lymphocytes Relative: 17 %
Lymphs Abs: 1.2 10*3/uL (ref 0.7–4.0)
MCH: 28.9 pg (ref 26.0–34.0)
MCHC: 32.6 g/dL (ref 30.0–36.0)
MCV: 88.5 fL (ref 80.0–100.0)
Monocytes Absolute: 0.7 10*3/uL (ref 0.1–1.0)
Monocytes Relative: 9 %
Neutro Abs: 4.8 10*3/uL (ref 1.7–7.7)
Neutrophils Relative %: 65 %
Platelets: 228 10*3/uL (ref 150–400)
RBC: 3.74 MIL/uL — ABNORMAL LOW (ref 4.22–5.81)
RDW: 14.6 % (ref 11.5–15.5)
WBC: 7.4 10*3/uL (ref 4.0–10.5)
nRBC: 0 % (ref 0.0–0.2)

## 2019-01-28 LAB — COMPREHENSIVE METABOLIC PANEL
ALT: 19 U/L (ref 0–44)
AST: 25 U/L (ref 15–41)
Albumin: 3.4 g/dL — ABNORMAL LOW (ref 3.5–5.0)
Alkaline Phosphatase: 73 U/L (ref 38–126)
Anion gap: 9 (ref 5–15)
BUN: 15 mg/dL (ref 8–23)
CO2: 26 mmol/L (ref 22–32)
Calcium: 9.4 mg/dL (ref 8.9–10.3)
Chloride: 101 mmol/L (ref 98–111)
Creatinine, Ser: 0.67 mg/dL (ref 0.61–1.24)
GFR calc Af Amer: >60 mL/min (ref >60)
GFR calc non Af Amer: >60 mL/min (ref >60)
Glucose, Bld: 152 mg/dL — ABNORMAL HIGH (ref 70–99)
Potassium: 4 mmol/L (ref 3.5–5.1)
Sodium: 136 mmol/L (ref 135–145)
Total Bilirubin: 0.7 mg/dL (ref 0.3–1.2)
Total Protein: 6.9 g/dL (ref 6.5–8.1)

## 2019-01-28 MED ORDER — DEXAMETHASONE SODIUM PHOSPHATE 10 MG/ML IJ SOLN
10.0000 mg | Freq: Once | INTRAMUSCULAR | Status: AC
  Administered 2019-01-28: 10 mg via INTRAVENOUS
  Filled 2019-01-28: qty 1

## 2019-01-28 MED ORDER — SODIUM CHLORIDE 0.9% FLUSH
10.0000 mL | Freq: Once | INTRAVENOUS | Status: AC
  Administered 2019-01-28: 10 mL via INTRAVENOUS
  Filled 2019-01-28: qty 10

## 2019-01-28 MED ORDER — SODIUM CHLORIDE 0.9 % IV SOLN
Freq: Once | INTRAVENOUS | Status: AC
  Administered 2019-01-28: 10:00:00 via INTRAVENOUS
  Filled 2019-01-28: qty 250

## 2019-01-28 MED ORDER — HEPARIN SOD (PORK) LOCK FLUSH 100 UNIT/ML IV SOLN
500.0000 [IU] | Freq: Once | INTRAVENOUS | Status: DC | PRN
  Filled 2019-01-28: qty 5

## 2019-01-28 MED ORDER — HEPARIN SOD (PORK) LOCK FLUSH 100 UNIT/ML IV SOLN
500.0000 [IU] | Freq: Once | INTRAVENOUS | Status: AC
  Administered 2019-01-28: 12:00:00 500 [IU] via INTRAVENOUS

## 2019-01-28 MED ORDER — SODIUM CHLORIDE 0.9 % IV SOLN
1.0000 mg/kg | Freq: Once | INTRAVENOUS | Status: AC
  Administered 2019-01-28: 70 mg via INTRAVENOUS
  Filled 2019-01-28: qty 7

## 2019-01-28 MED ORDER — PALONOSETRON HCL INJECTION 0.25 MG/5ML
0.2500 mg | Freq: Once | INTRAVENOUS | Status: AC
  Administered 2019-01-28: 0.25 mg via INTRAVENOUS
  Filled 2019-01-28: qty 5

## 2019-01-28 NOTE — Progress Notes (Signed)
Hematology/Oncology Consult note [facility]  [phone] [phone]  Patient Care Team: Jerrol Banana., MD as PCP - General (Family Medicine) Dingeldein, [name], MD as Consulting Physician (Ophthalmology) [name] as Consulting Physician (Internal Medicine) [name], MD as Consulting Physician (Urology) [name], MD as Referring Physician (Hematology and Oncology) [name], MD as Consulting Physician (Pulmonary Disease)   Name of the patient: Patrick Mcgee  [id number]  [birth year]   Date of visit: 01/28/19  Diagnosis-  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 [name] so far for his metastatic urothelial carcinoma. This was originally diagnosed in May 2019. He was noted to have a filling defect in the lower pole collecting system of the left kidney along with para-aortic and retroperitoneal adenopathy concerning for metastatic disease. He underwent left ureteroscopy and renal pelvis biopsy which revealed small fragments of high-grade urothelial carcinoma with small focus of invasion. He was started on carboplatin and gemcitabine chemotherapy in June 2019 and was continued on 05/27/2018 for 8 cycles. He tolerated chemotherapy well except for chemo-induced anemia for which she has been getting Procrit every 2 weeks. He did have response to his disease based on scans in August 2019. However repeat scan on 05/31/2018 showed increase in the size of the primary tumor from 1.2 to 1.9 cm and increase in left para-aortic adenopathy from 0.9 to 1.3 cm. Periportal adenopathy was stable at 1.4 cm. Second line immunotherapy was recommended. His initial biopsy specimen did not have enough sample to undergo  FGFR mutation testing. He also has mild interstitial lung disease for which he sees pulmonary but he is not on home oxygen. He has B12 deficiency for which he is on oral B12. Also has diabetes and coronary artery disease.Tecentriq started on 06/17/2018.  Patient noted to have disease progression in his lymph nodes in May 2020.  Repeat biopsy showed metastatic urothelial carcinoma which did not have a FGFR mutation.  He has been started on third line Padcev   Interval history-he does report tingling numbness in his hands and feet which he says is currently mild and self-limited.  He has chronic fatigue.  Denies other complaints  ECOG PS- 1 Pain scale- 0 Opioid associated constipation- no  Review of systems- Review of Systems  Constitutional: Positive for malaise/fatigue. Negative for chills, fever and weight loss.  HENT: Negative for congestion, ear discharge and nosebleeds.   Eyes: Negative for blurred vision.  Respiratory: Negative for cough, hemoptysis, sputum production, shortness of breath and wheezing.   Cardiovascular: Negative for chest pain, palpitations, orthopnea and claudication.  Gastrointestinal: Negative for abdominal pain, blood in stool, constipation, diarrhea, heartburn, melena, nausea and vomiting.  Genitourinary: Negative for dysuria, flank pain, frequency, hematuria and urgency.  Musculoskeletal: Negative for back pain, joint pain and myalgias.  Skin: Negative for rash.  Neurological: Positive for sensory change (Peripheral neuropathy). Negative for dizziness, tingling, focal weakness, seizures, weakness and headaches.  Endo/Heme/Allergies: Does not bruise/bleed easily.  Psychiatric/Behavioral: Negative for depression and suicidal ideas. The patient does not have insomnia.        Allergies  Allergen Reactions   Sulfa Antibiotics Other (See Comments)    Joint pain   Ace Inhibitors Cough   Invokana [Canagliflozin] Other (See Comments)    Leg pain  Penicillins Rash    Did it involve swelling of the face/tongue/throat, SOB, or low BP? No Did it involve sudden or severe rash/hives, skin peeling, or any reaction on the inside of your mouth or nose? Yes Did you need to seek medical attention at a hospital or doctor's office? Yes When did it last happen?childhood allergy If all above answers are NO, may proceed with cephalosporin use.      Past Medical History:  Diagnosis Date   Acid reflux    Anxiety    Depression    Diabetes mellitus without complication ([place])    Hyperlipidemia    Hypertension    MRSA (methicillin resistant Staphylococcus aureus) infection 1992   history   Myocardial infarction ([place]) 1995   Sleep apnea    CPAP   Urothelial cancer ([place]) 11/2017   Left Urothelial mass, chemo tx's     Past Surgical History:  Procedure Laterality Date   CATARACT EXTRACTION Left    COLONOSCOPY  2010   [facility]   COLONOSCOPY WITH PROPOFOL N/A 10/04/2016   Procedure: COLONOSCOPY WITH PROPOFOL;  Surgeon: [name], MD;  Location: [facility];  Service: Endoscopy;  Laterality: N/A;   [place], 2000, 2001, 2014   CYSTOSCOPY W/ RETROGRADES Bilateral 12/10/2017   Procedure: CYSTOSCOPY WITH RETROGRADE PYELOGRAM;  Surgeon: [name], MD;  Location: [facility];  Service: Urology;  Laterality: Bilateral;   CYSTOSCOPY W/ RETROGRADES Left 12/09/2018   Procedure: CYSTOSCOPY WITH RETROGRADE PYELOGRAM;  Surgeon: [name], MD;  Location: [facility];  Service: Urology;  Laterality: Left;   CYSTOSCOPY WITH BIOPSY Left 12/09/2018   Procedure: CYSTOSCOPY WITH URETERAL/RENAL PELVIC BIOPSY;  Surgeon: [name], MD;  Location: [facility];  Service: Urology;  Laterality: Left;   CYSTOSCOPY WITH STENT PLACEMENT Left 12/10/2017   Procedure: CYSTOSCOPY WITH STENT PLACEMENT;  Surgeon: [name], MD;  Location: [facility];  Service: Urology;  Laterality: Left;    CYSTOSCOPY WITH STENT PLACEMENT Left 12/09/2018   Procedure: CYSTOSCOPY WITH STENT PLACEMENT;  Surgeon: [name], MD;  Location: [facility];  Service: Urology;  Laterality: Left;   CYSTOSCOPY WITH URETEROSCOPY Left 12/09/2018   Procedure: CYSTOSCOPY WITH URETEROSCOPY;  Surgeon: [name], MD;  Location: [facility];  Service: Urology;  Laterality: Left;   EYE SURGERY Bilateral    cataract   INGUINAL HERNIA REPAIR Right 08/09/2015   Procedure: HERNIA REPAIR INGUINAL ADULT;  Surgeon: [name], MD;  Location: [facility];  Service: General;  Laterality: Right;   NASAL SINUS SURGERY     nuclear stress test     PORTA CATH INSERTION N/A 12/19/2017   Procedure: PORTA CATH INSERTION;  Surgeon: [name], MD;  Location: [facility];  Service: Cardiovascular;  Laterality: N/A;   URETERAL BIOPSY Left 12/10/2017   Procedure: URETERAL & renal PELVIS BIOPSY;  Surgeon: [name], MD;  Location: [facility];  Service: Urology;  Laterality: Left;   URETEROSCOPY Left 12/10/2017   Procedure: URETEROSCOPY;  Surgeon: [name], MD;  Location: [facility];  Service: Urology;  Laterality: Left;    Social History   Socioeconomic History   Marital status: Married    Spouse name: [name]   Number of children: 1   Years of education: Not on file   Highest education level: Associate degree: occupational, Hotel manager, or vocational program  Occupational History   Occupation: retired  Scientist, product/process development strain: Not hard at [employer] insecurity  Worry: Never true    Inability: Never true   Transportation needs    Medical: No    Non-medical: No  Tobacco Use   Smoking status: Former Smoker    Types: Cigars    Quit date: 10/09/1978    Years since quitting: 40.3   Smokeless tobacco: Former Systems developer    Types: Chew    Quit date: 12/04/1988   Tobacco comment: on occasion  Substance and Sexual Activity   Alcohol use: Not Currently   Drug use: No   Sexual  activity: Not Currently  Lifestyle   Physical activity    Days per week: 0 days    Minutes per session: 0 min   Stress: Only a little  Relationships   Social connections    Talks on phone: More than three times a week    Gets together: More than three times a week    Attends religious service: More than 4 times per year    Active member of club or organization: Not on file    Attends meetings of clubs or organizations: Not on file    Relationship status: Married   Intimate partner violence    Fear of current or ex partner: No    Emotionally abused: No    Physically abused: No    Forced sexual activity: No  Other Topics Concern   Not on file  Social History Narrative   Not on file    Family History  Problem Relation Age of Onset   Heart disease Mother    Cancer Father        Lung and colon cancer   Heart disease Father    Emphysema Maternal Grandfather    Tuberculosis Maternal Grandmother      Current Outpatient Medications:    aspirin EC 81 MG tablet, Take 81 mg by mouth every evening. , Disp: , Rfl:    atorvastatin (LIPITOR) 40 MG tablet, Take 1 tablet (40 mg total) by mouth at bedtime., Disp: 90 tablet, Rfl: 3   carvedilol (COREG) 12.5 MG tablet, TAKE 1 TABLET BY MOUTH TWICE DAILY (Patient taking differently: Take 12.5 mg by mouth 2 (two) times a day. ), Disp: 180 tablet, Rfl: 3   clobetasol cream (TEMOVATE) 4.62 %, Apply 1 application topically as needed (for skin rash)., Disp: 30 g, Rfl: 1   diphenhydrAMINE (BENADRYL) 25 MG tablet, Take 25 mg by mouth daily as needed for allergies., Disp: , Rfl:    empagliflozin (JARDIANCE) 10 MG TABS tablet, Take 10 mg by mouth daily. (Patient taking differently: Take 10 mg by mouth daily as needed (if blood sugar is over 200). ), Disp: 30 tablet, Rfl: 11   feeding supplement, ENSURE ENLIVE, (ENSURE ENLIVE) LIQD, Take 237 mLs by mouth 3 (three) times daily between meals. (Patient taking differently: Take 237 mLs by  mouth 2 (two) times a week. ), Disp: 90 Bottle, Rfl: 0   fluticasone (FLONASE) 50 MCG/ACT nasal spray, Use 2 spray(s) in each nostril once daily (Patient taking differently: Place 2 sprays into both nostrils daily. ), Disp: 48 g, Rfl: 3   glucose blood (CONTOUR NEXT TEST) test strip, Check blood sugars 3 times daily., Disp: 300 each, Rfl: 11   hydrOXYzine (ATARAX/VISTARIL) 25 MG tablet, Take 1 tablet (25 mg total) by mouth every 8 (eight) hours as needed for itching., Disp: 30 tablet, Rfl: 3   Ivermectin (SOOLANTRA) 1 % CREA, Apply 1 application topically daily as needed (rosacea). To face, Disp: , Rfl:  lidocaine-prilocaine (EMLA) cream, Apply 1 application topically as needed (port access)., Disp: 1 g, Rfl: 3   losartan (COZAAR) 100 MG tablet, TAKE 1 TABLET BY MOUTH ONCE DAILY (Patient taking differently: Take 100 mg by mouth at bedtime. ), Disp: 90 tablet, Rfl: 3   magnesium hydroxide (MILK OF MAGNESIA) 400 MG/5ML suspension, Take 15 mLs by mouth daily as needed for mild constipation., Disp: , Rfl:    metFORMIN (GLUCOPHAGE) 1000 MG tablet, TAKE 1 TABLET BY MOUTH TWICE DAILY WITH MEALS (Patient taking differently: Take 1,000 mg by mouth 2 (two) times a day. ), Disp: 180 tablet, Rfl: 3   nystatin cream (MYCOSTATIN), Apply topically 2 (two) times daily. (Patient taking differently: Apply 1 application topically 2 (two) times daily as needed (rash). ), Disp: 15 g, Rfl: 1   OVER THE COUNTER MEDICATION, Apply 1 application topically daily as needed (pain). Outback topical pain relief, Disp: , Rfl:    oxybutynin (DITROPAN) 5 MG tablet, Take 1 tablet (5 mg total) by mouth every 8 (eight) hours as needed for bladder spasms., Disp: 30 tablet, Rfl: 0   sertraline (ZOLOFT) 100 MG tablet, Take 1 tablet (100 mg total) by mouth daily., Disp: 90 tablet, Rfl: 3   vitamin B-12 (CYANOCOBALAMIN) 1000 MCG tablet, Take 1,000 mcg by mouth daily., Disp: , Rfl:    vitamin C (ASCORBIC ACID) 500 MG tablet,  Take 1,000-1,500 mg by mouth daily as needed (immune support)., Disp: , Rfl:    HYDROcodone-acetaminophen (NORCO/VICODIN) 5-325 MG tablet, Take 1-2 tablets by mouth every 6 (six) hours as needed for moderate pain. (Patient not taking: Reported on 01/14/2019), Disp: 6 tablet, Rfl: 0   tamsulosin (FLOMAX) 0.4 MG CAPS capsule, Take 1 capsule (0.4 mg total) by mouth daily. (Patient not taking: Reported on 01/14/2019), Disp: 14 capsule, Rfl: 0 No current facility-administered medications for this visit.   Facility-Administered Medications Ordered in Other Visits:    heparin lock flush 100 unit/mL, 500 Units, Intracatheter, Once PRN, Sindy Guadeloupe, MD   Influenza vac split quadrivalent PF (FLUZONE HIGH-DOSE) injection 0.5 mL, 0.5 mL, Intramuscular, Once, Karen Kitchens, NP  Physical exam:  Vitals:   01/28/19 0937  BP: 96/63  Pulse: 64  Resp: 18  Temp: (!) 95.8 F (35.4 C)  TempSrc: Tympanic  Weight: 155 lb 1.6 oz (70.4 kg)   Physical Exam HENT:     Head: Normocephalic and atraumatic.  Eyes:     Pupils: Pupils are equal, round, and reactive to light.  Neck:     Musculoskeletal: Normal range of motion.  Cardiovascular:     Rate and Rhythm: Normal rate and regular rhythm.     Heart sounds: Normal heart sounds.  Pulmonary:     Effort: Pulmonary effort is normal.     Breath sounds: Normal breath sounds.  Abdominal:     General: Bowel sounds are normal.     Palpations: Abdomen is soft.  Skin:    General: Skin is warm and dry.  Neurological:     Mental Status: He is alert and oriented to person, place, and time.      CMP Latest Ref Rng & Units 01/28/2019  Glucose 70 - 99 mg/dL 152(H)  BUN 8 - 23 mg/dL 15  Creatinine 0.61 - 1.24 mg/dL 0.67  Sodium 135 - 145 mmol/L 136  Potassium 3.5 - 5.1 mmol/L 4.0  Chloride 98 - 111 mmol/L 101  CO2 22 - 32 mmol/L 26  Calcium 8.9 - 10.3 mg/dL 9.4  Total Protein  6.5 - 8.1 g/dL 6.9  Total Bilirubin 0.3 - 1.2 mg/dL 0.7  Alkaline Phos 38 - 126 U/L  73  AST 15 - 41 U/L 25  ALT 0 - 44 U/L 19   CBC Latest Ref Rng & Units 01/28/2019  WBC 4.0 - 10.5 K/uL 7.4  Hemoglobin 13.0 - 17.0 g/dL 10.8(L)  Hematocrit 39.0 - 52.0 % 33.1(L)  Platelets 150 - 400 K/uL 228     Assessment and plan- Patient is a 73 y.o. male  with history of metastatic urothelial carcinoma with metastases to the lymph nodes.  He had disease progression on carboplatin/gemcitabine as well as second line Tecentriq.    He is here for on treatment assessment prior to cycle 1 day 15 of padcev  Counts okay to proceed with cycle 1 day 15 of Padcev today.  I will see him back in 2 weeks time with a CBC with differential, CMP for cycle 2-day 1.  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 Encounter for monoclonal antibody treatment for malignancy   2. Urothelial carcinoma of kidney, left (HCC)      Dr. Randa Evens, MD, MPH [facility] [phone] 01/28/2019 1:13 PM

## 2019-01-28 NOTE — Progress Notes (Signed)
Patient is here for follow up, he is doing well no complaints  

## 2019-01-28 NOTE — Telephone Encounter (Signed)
Order for this was signed and faxed.

## 2019-01-29 ENCOUNTER — Other Ambulatory Visit: Payer: Self-pay

## 2019-01-29 DIAGNOSIS — E119 Type 2 diabetes mellitus without complications: Secondary | ICD-10-CM

## 2019-01-29 MED ORDER — JARDIANCE 10 MG PO TABS: 10.0000 mg | ORAL_TABLET | Freq: Every day | ORAL | 3 refills | Status: DC

## 2019-02-07 ENCOUNTER — Telehealth: Payer: Self-pay

## 2019-02-07 NOTE — Telephone Encounter (Signed)
Telephone call to patient to schedule palliative care visit.  Patient reports he and family is leaving for the beach on Wednesday 02/12/19.  Wife Patrick Mcgee requests palliative care team call on 02-24-19 to schedule visit for the week of 02-24-19. Patient informed to call with questions or concerns.

## 2019-02-10 ENCOUNTER — Other Ambulatory Visit: Payer: Self-pay

## 2019-02-11 ENCOUNTER — Inpatient Hospital Stay: Payer: Medicare Other | Attending: Oncology

## 2019-02-11 ENCOUNTER — Other Ambulatory Visit: Payer: Self-pay

## 2019-02-11 ENCOUNTER — Inpatient Hospital Stay: Payer: Medicare Other

## 2019-02-11 ENCOUNTER — Inpatient Hospital Stay (HOSPITAL_BASED_OUTPATIENT_CLINIC_OR_DEPARTMENT_OTHER): Payer: Medicare Other | Admitting: Oncology

## 2019-02-11 VITALS — BP 115/72 | HR 68 | Temp 96.5°F | Resp 18 | Wt 157.9 lb

## 2019-02-11 DIAGNOSIS — T451X5A Adverse effect of antineoplastic and immunosuppressive drugs, initial encounter: Secondary | ICD-10-CM

## 2019-02-11 DIAGNOSIS — Z5112 Encounter for antineoplastic immunotherapy: Secondary | ICD-10-CM | POA: Insufficient documentation

## 2019-02-11 DIAGNOSIS — C652 Malignant neoplasm of left renal pelvis: Secondary | ICD-10-CM | POA: Insufficient documentation

## 2019-02-11 DIAGNOSIS — I25119 Atherosclerotic heart disease of native coronary artery with unspecified angina pectoris: Secondary | ICD-10-CM

## 2019-02-11 DIAGNOSIS — C642 Malignant neoplasm of left kidney, except renal pelvis: Secondary | ICD-10-CM | POA: Diagnosis not present

## 2019-02-11 DIAGNOSIS — Z5111 Encounter for antineoplastic chemotherapy: Secondary | ICD-10-CM

## 2019-02-11 DIAGNOSIS — C772 Secondary and unspecified malignant neoplasm of intra-abdominal lymph nodes: Secondary | ICD-10-CM | POA: Insufficient documentation

## 2019-02-11 DIAGNOSIS — D701 Agranulocytosis secondary to cancer chemotherapy: Secondary | ICD-10-CM

## 2019-02-11 DIAGNOSIS — G620 Drug-induced polyneuropathy: Secondary | ICD-10-CM

## 2019-02-11 DIAGNOSIS — Z95828 Presence of other vascular implants and grafts: Secondary | ICD-10-CM

## 2019-02-11 LAB — CBC WITH DIFFERENTIAL/PLATELET
Abs Immature Granulocytes: 0.01 10*3/uL (ref 0.00–0.07)
Basophils Absolute: 0.1 10*3/uL (ref 0.0–0.1)
Basophils Relative: 1 %
Eosinophils Absolute: 0.2 10*3/uL (ref 0.0–0.5)
Eosinophils Relative: 4 %
HCT: 33.7 % — ABNORMAL LOW (ref 39.0–52.0)
Hemoglobin: 11 g/dL — ABNORMAL LOW (ref 13.0–17.0)
Immature Granulocytes: 0 %
Lymphocytes Relative: 27 %
Lymphs Abs: 1.6 10*3/uL (ref 0.7–4.0)
MCH: 28.7 pg (ref 26.0–34.0)
MCHC: 32.6 g/dL (ref 30.0–36.0)
MCV: 88 fL (ref 80.0–100.0)
Monocytes Absolute: 0.7 10*3/uL (ref 0.1–1.0)
Monocytes Relative: 12 %
Neutro Abs: 3.2 10*3/uL (ref 1.7–7.7)
Neutrophils Relative %: 56 %
Platelets: 210 10*3/uL (ref 150–400)
RBC: 3.83 MIL/uL — ABNORMAL LOW (ref 4.22–5.81)
RDW: 15.4 % (ref 11.5–15.5)
WBC: 5.7 10*3/uL (ref 4.0–10.5)
nRBC: 0 % (ref 0.0–0.2)

## 2019-02-11 LAB — COMPREHENSIVE METABOLIC PANEL
ALT: 30 U/L (ref 0–44)
AST: 29 U/L (ref 15–41)
Albumin: 3.7 g/dL (ref 3.5–5.0)
Alkaline Phosphatase: 73 U/L (ref 38–126)
Anion gap: 7 (ref 5–15)
BUN: 13 mg/dL (ref 8–23)
CO2: 26 mmol/L (ref 22–32)
Calcium: 9.2 mg/dL (ref 8.9–10.3)
Chloride: 102 mmol/L (ref 98–111)
Creatinine, Ser: 0.77 mg/dL (ref 0.61–1.24)
GFR calc Af Amer: >60 mL/min (ref >60)
GFR calc non Af Amer: >60 mL/min (ref >60)
Glucose, Bld: 181 mg/dL — ABNORMAL HIGH (ref 70–99)
Potassium: 4.2 mmol/L (ref 3.5–5.1)
Sodium: 135 mmol/L (ref 135–145)
Total Bilirubin: 0.7 mg/dL (ref 0.3–1.2)
Total Protein: 6.9 g/dL (ref 6.5–8.1)

## 2019-02-11 MED ORDER — DEXAMETHASONE SODIUM PHOSPHATE 10 MG/ML IJ SOLN
10.0000 mg | Freq: Once | INTRAMUSCULAR | Status: AC
  Administered 2019-02-11: 10 mg via INTRAVENOUS
  Filled 2019-02-11: qty 1

## 2019-02-11 MED ORDER — SODIUM CHLORIDE 0.9% FLUSH
10.0000 mL | Freq: Once | INTRAVENOUS | Status: AC
  Administered 2019-02-11: 09:00:00 10 mL via INTRAVENOUS
  Filled 2019-02-11: qty 10

## 2019-02-11 MED ORDER — PALONOSETRON HCL INJECTION 0.25 MG/5ML
0.2500 mg | Freq: Once | INTRAVENOUS | Status: AC
  Administered 2019-02-11: 10:00:00 0.25 mg via INTRAVENOUS
  Filled 2019-02-11: qty 5

## 2019-02-11 MED ORDER — SODIUM CHLORIDE 0.9 % IV SOLN
Freq: Once | INTRAVENOUS | Status: AC
  Administered 2019-02-11: 10:00:00 via INTRAVENOUS
  Filled 2019-02-11: qty 250

## 2019-02-11 MED ORDER — SODIUM CHLORIDE 0.9 % IV SOLN
1.0000 mg/kg | Freq: Once | INTRAVENOUS | Status: AC
  Administered 2019-02-11: 70 mg via INTRAVENOUS
  Filled 2019-02-11: qty 7

## 2019-02-11 MED ORDER — HEPARIN SOD (PORK) LOCK FLUSH 100 UNIT/ML IV SOLN
500.0000 [IU] | Freq: Once | INTRAVENOUS | Status: AC | PRN
  Administered 2019-02-11: 12:00:00 500 [IU]
  Filled 2019-02-11 (×2): qty 5

## 2019-02-11 NOTE — Progress Notes (Signed)
Patient is here for follow up, he is doing well. Would like to discuss is there is anything he can take to raise RBC, he is taking steroids and would like to know if this benefit will out weight the risk of raising his blood sugar. Patient is experiencing side effects of treatment, rash, neuropathy in in hands and feet. Is he taking PADCEV? Wife wrote out questions for patient to discuss with you.

## 2019-02-12 ENCOUNTER — Encounter: Payer: Self-pay | Admitting: Oncology

## 2019-02-12 MED ORDER — GABAPENTIN 300 MG PO CAPS: 300.0000 mg | ORAL_CAPSULE | Freq: Three times a day (TID) | ORAL | 0 refills | Status: DC

## 2019-02-12 NOTE — Progress Notes (Signed)
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 [name] so far for his metastatic urothelial carcinoma. This was originally diagnosed in May 2019. He was noted to have a filling defect in the lower pole collecting system of the left kidney along with para-aortic and retroperitoneal adenopathy concerning for metastatic disease. He underwent left ureteroscopy and renal pelvis biopsy which revealed small fragments of high-grade urothelial carcinoma with small focus of invasion. He was started on carboplatin and gemcitabine chemotherapy in June 2019 and was continued on 05/27/2018 for 8 cycles. He tolerated chemotherapy well except for chemo-induced anemia for which she has been getting Procrit every 2 weeks. He did have response to his disease based on scans in August 2019. However repeat scan on 05/31/2018 showed increase in the size of the primary tumor from 1.2 to 1.9 cm and increase in left para-aortic adenopathy from 0.9 to 1.3 cm. Periportal adenopathy was stable at 1.4 cm. Second line immunotherapy was recommended. His initial biopsy specimen did not have enough sample to undergo  FGFR mutation testing. He also has mild interstitial lung disease for which he sees pulmonary but he is not on home oxygen. He has B12 deficiency for which he is on oral B12. Also has diabetes and coronary artery disease.Tecentriq started on 06/17/2018.Patient noted to have disease progression in his lymph nodes in May 2020. Repeat biopsy showed metastatic urothelial carcinoma which did not have a FGFR mutation. He has been started on third line Padcev  Interval history-reports that his neuropathy is still getting worse especially in his feet.  He has chronic fatigue.  ECOG PS- 1 Pain scale- 2 Opioid associated constipation- no  Review of systems- Review of Systems  Constitutional: Positive for malaise/fatigue. Negative for chills, fever and weight loss.  HENT: Negative for congestion, ear discharge and nosebleeds.   Eyes: Negative for blurred vision.  Respiratory: Negative for cough, hemoptysis, sputum production, shortness of breath and wheezing.   Cardiovascular: Negative for chest pain, palpitations, orthopnea and claudication.  Gastrointestinal: Negative for abdominal pain, blood in stool, constipation, diarrhea, heartburn, melena, nausea and vomiting.  Genitourinary: Negative for dysuria, flank pain, frequency, hematuria and urgency.  Musculoskeletal: Negative for back pain, joint pain and myalgias.  Skin: Negative for rash.  Neurological: Positive for sensory change (Peripheral neuropathy). Negative for dizziness, tingling, focal weakness, seizures, weakness and headaches.  Endo/Heme/Allergies: Does not bruise/bleed easily.  Psychiatric/Behavioral: Negative for depression and suicidal ideas. The patient does not have insomnia.       Allergies  Allergen Reactions   Sulfa Antibiotics Other (See Comments)    Joint pain   Ace Inhibitors Cough   Invokana [Canagliflozin] Other (See Comments)    Leg pain   Penicillins Rash    Did it involve swelling of the  face/tongue/throat, SOB, or low BP? No Did  it involve sudden or severe rash/hives, skin peeling, or any reaction on the inside of your mouth or nose? Yes Did you need to seek medical attention at a hospital or doctor's office? Yes When did it last happen?childhood allergy If all above answers are NO, may proceed with cephalosporin use.      Past Medical History:  Diagnosis Date   Acid reflux    Anxiety    Depression    Diabetes mellitus without complication ([place])    Hyperlipidemia    Hypertension    MRSA (methicillin resistant Staphylococcus aureus) infection 1992   history   Myocardial infarction ([place]) 1995   Sleep apnea    CPAP   Urothelial cancer ([place]) 11/2017   Left Urothelial mass, chemo tx's     Past Surgical History:  Procedure Laterality Date   CATARACT EXTRACTION Left    COLONOSCOPY  2010   [facility]   COLONOSCOPY WITH PROPOFOL N/A 10/04/2016   Procedure: COLONOSCOPY WITH PROPOFOL;  Surgeon: [name], MD;  Location: [place];  Service: Endoscopy;  Laterality: N/A;   [place], 2000, 2001, 2014   CYSTOSCOPY W/ RETROGRADES Bilateral 12/10/2017   Procedure: CYSTOSCOPY WITH RETROGRADE PYELOGRAM;  Surgeon: [name], MD;  Location: [facility];  Service: Urology;  Laterality: Bilateral;   CYSTOSCOPY W/ RETROGRADES Left 12/09/2018   Procedure: CYSTOSCOPY WITH RETROGRADE PYELOGRAM;  Surgeon: [name], MD;  Location: [facility];  Service: Urology;  Laterality: Left;   CYSTOSCOPY WITH BIOPSY Left 12/09/2018   Procedure: CYSTOSCOPY WITH URETERAL/RENAL PELVIC BIOPSY;  Surgeon: [name], MD;  Location: [facility];  Service: Urology;  Laterality: Left;   CYSTOSCOPY WITH STENT PLACEMENT Left 12/10/2017   Procedure: CYSTOSCOPY WITH STENT PLACEMENT;  Surgeon: [name], MD;  Location: [facility];  Service: Urology;  Laterality: Left;   CYSTOSCOPY WITH STENT PLACEMENT Left 12/09/2018   Procedure:  CYSTOSCOPY WITH STENT PLACEMENT;  Surgeon: [name], MD;  Location: [facility];  Service: Urology;  Laterality: Left;   CYSTOSCOPY WITH URETEROSCOPY Left 12/09/2018   Procedure: CYSTOSCOPY WITH URETEROSCOPY;  Surgeon: [name], MD;  Location: [facility];  Service: Urology;  Laterality: Left;   EYE SURGERY Bilateral    cataract   INGUINAL HERNIA REPAIR Right 08/09/2015   Procedure: HERNIA REPAIR INGUINAL ADULT;  Surgeon: [name], MD;  Location: [facility];  Service: General;  Laterality: Right;   NASAL SINUS SURGERY     nuclear stress test     PORTA CATH INSERTION N/A 12/19/2017   Procedure: PORTA CATH INSERTION;  Surgeon: [name], MD;  Location: [place] [facility];  Service: Cardiovascular;  Laterality: N/A;   URETERAL BIOPSY Left 12/10/2017   Procedure: URETERAL & renal PELVIS BIOPSY;  Surgeon: [name], MD;  Location: [facility];  Service: Urology;  Laterality: Left;   URETEROSCOPY Left 12/10/2017   Procedure: URETEROSCOPY;  Surgeon: [name], MD;  Location: [facility];  Service: Urology;  Laterality: Left;    Social History   Socioeconomic History   Marital status: Married    Spouse name: [name]   Number of children: 1   Years of education: Not on file   Highest education level: Associate degree: occupational, Hotel manager, or vocational program  Occupational History   Occupation: retired  Scientist, product/process development strain: Not hard at [employer]    Worry: Never true    Inability: Never true   Transportation needs    Medical:  No    Non-medical: No  Tobacco Use   Smoking status: Former Smoker    Types: Cigars    Quit date: 10/09/1978    Years since quitting: 40.3   Smokeless tobacco: Former Systems developer    Types: Chew    Quit date: 12/04/1988   Tobacco comment: on occasion  Substance and Sexual Activity   Alcohol use: Not Currently   Drug use: No   Sexual activity: Not Currently  Lifestyle   Physical activity     Days per week: 0 days    Minutes per session: 0 min   Stress: Only a little  Relationships   Social connections    Talks on phone: More than three times a week    Gets together: More than three times a week    Attends religious service: More than 4 times per year    Active member of club or organization: Not on file    Attends meetings of clubs or organizations: Not on file    Relationship status: Married   Intimate partner violence    Fear of current or ex partner: No    Emotionally abused: No    Physically abused: No    Forced sexual activity: No  Other Topics Concern   Not on file  Social History Narrative   Not on file    Family History  Problem Relation Age of Onset   Heart disease Mother    Cancer Father        Lung and colon cancer   Heart disease Father    Emphysema Maternal Grandfather    Tuberculosis Maternal Grandmother      Current Outpatient Medications:    aspirin EC 81 MG tablet, Take 81 mg by mouth every evening. , Disp: , Rfl:    atorvastatin (LIPITOR) 40 MG tablet, Take 1 tablet (40 mg total) by mouth at bedtime., Disp: 90 tablet, Rfl: 3   carvedilol (COREG) 12.5 MG tablet, TAKE 1 TABLET BY MOUTH TWICE DAILY (Patient taking differently: Take 12.5 mg by mouth 2 (two) times a day. ), Disp: 180 tablet, Rfl: 3   clobetasol cream (TEMOVATE) 4.03 %, Apply 1 application topically as needed (for skin rash)., Disp: 30 g, Rfl: 1   diphenhydrAMINE (BENADRYL) 25 MG tablet, Take 25 mg by mouth daily as needed for allergies., Disp: , Rfl:    empagliflozin (JARDIANCE) 10 MG TABS tablet, Take 10 mg by mouth daily., Disp: 90 tablet, Rfl: 3   feeding supplement, ENSURE ENLIVE, (ENSURE ENLIVE) LIQD, Take 237 mLs by mouth 3 (three) times daily between meals. (Patient taking differently: Take 237 mLs by mouth 2 (two) times a week. ), Disp: 90 Bottle, Rfl: 0   fluticasone (FLONASE) 50 MCG/ACT nasal spray, Use 2 spray(s) in each nostril once daily (Patient taking  differently: Place 2 sprays into both nostrils daily. ), Disp: 48 g, Rfl: 3   glucose blood (CONTOUR NEXT TEST) test strip, Check blood sugars 3 times daily., Disp: 300 each, Rfl: 11   HYDROcodone-acetaminophen (NORCO/VICODIN) 5-325 MG tablet, Take 1-2 tablets by mouth every 6 (six) hours as needed for moderate pain., Disp: 6 tablet, Rfl: 0   hydrOXYzine (ATARAX/VISTARIL) 25 MG tablet, Take 1 tablet (25 mg total) by mouth every 8 (eight) hours as needed for itching., Disp: 30 tablet, Rfl: 3   Ivermectin (SOOLANTRA) 1 % CREA, Apply 1 application topically daily as needed (rosacea). To face, Disp: , Rfl:    lidocaine-prilocaine (EMLA) cream, Apply 1 application topically as  needed (port access)., Disp: 1 g, Rfl: 3   losartan (COZAAR) 100 MG tablet, TAKE 1 TABLET BY MOUTH ONCE DAILY (Patient taking differently: Take 100 mg by mouth at bedtime. ), Disp: 90 tablet, Rfl: 3   magnesium hydroxide (MILK OF MAGNESIA) 400 MG/5ML suspension, Take 15 mLs by mouth daily as needed for mild constipation., Disp: , Rfl:    metFORMIN (GLUCOPHAGE) 1000 MG tablet, TAKE 1 TABLET BY MOUTH TWICE DAILY WITH MEALS (Patient taking differently: Take 1,000 mg by mouth 2 (two) times a day. ), Disp: 180 tablet, Rfl: 3   nystatin cream (MYCOSTATIN), Apply topically 2 (two) times daily. (Patient taking differently: Apply 1 application topically 2 (two) times daily as needed (rash). ), Disp: 15 g, Rfl: 1   OVER THE COUNTER MEDICATION, Apply 1 application topically daily as needed (pain). Outback topical pain relief, Disp: , Rfl:    oxybutynin (DITROPAN) 5 MG tablet, Take 1 tablet (5 mg total) by mouth every 8 (eight) hours as needed for bladder spasms., Disp: 30 tablet, Rfl: 0   sertraline (ZOLOFT) 100 MG tablet, Take 1 tablet (100 mg total) by mouth daily., Disp: 90 tablet, Rfl: 3   tamsulosin (FLOMAX) 0.4 MG CAPS capsule, Take 1 capsule (0.4 mg total) by mouth daily., Disp: 14 capsule, Rfl: 0   vitamin B-12  (CYANOCOBALAMIN) 1000 MCG tablet, Take 1,000 mcg by mouth daily., Disp: , Rfl:    vitamin C (ASCORBIC ACID) 500 MG tablet, Take 1,000-1,500 mg by mouth daily as needed (immune support)., Disp: , Rfl:  No current facility-administered medications for this visit.   Facility-Administered Medications Ordered in Other Visits:    Influenza vac split quadrivalent PF (FLUZONE HIGH-DOSE) injection 0.5 mL, 0.5 mL, Intramuscular, Once, Karen Kitchens, NP  Physical exam:  Vitals:   02/11/19 0917  BP: 115/72  Pulse: 68  Resp: 18  Temp: (!) 96.5 F (35.8 C)  TempSrc: Tympanic  SpO2: 97%  Weight: 157 lb 14.4 oz (71.6 kg)   Physical Exam Constitutional:      Comments: Thin elderly gentleman no acute distress  HENT:     Head: Normocephalic and atraumatic.  Eyes:     Pupils: Pupils are equal, round, and reactive to light.  Neck:     Musculoskeletal: Normal range of motion.  Cardiovascular:     Rate and Rhythm: Normal rate and regular rhythm.     Heart sounds: Normal heart sounds.  Pulmonary:     Effort: Pulmonary effort is normal.     Breath sounds: Normal breath sounds.  Abdominal:     General: Bowel sounds are normal.     Palpations: Abdomen is soft.  Skin:    General: Skin is warm and dry.  Neurological:     Mental Status: He is alert and oriented to person, place, and time.      CMP Latest Ref Rng & Units 02/11/2019  Glucose 70 - 99 mg/dL 181(H)  BUN 8 - 23 mg/dL 13  Creatinine 0.61 - 1.24 mg/dL 0.77  Sodium 135 - 145 mmol/L 135  Potassium 3.5 - 5.1 mmol/L 4.2  Chloride 98 - 111 mmol/L 102  CO2 22 - 32 mmol/L 26  Calcium 8.9 - 10.3 mg/dL 9.2  Total Protein 6.5 - 8.1 g/dL 6.9  Total Bilirubin 0.3 - 1.2 mg/dL 0.7  Alkaline Phos 38 - 126 U/L 73  AST 15 - 41 U/L 29  ALT 0 - 44 U/L 30   CBC Latest Ref Rng & Units 02/11/2019  WBC 4.0 - 10.5 K/uL 5.7  Hemoglobin 13.0 - 17.0 g/dL 11.0(L)  Hematocrit 39.0 - 52.0 % 33.7(L)  Platelets 150 - 400 K/uL 210     Assessment and plan-  Patient is a 73 y.o. male with history of metastatic urothelial carcinoma with metastases to the lymph nodes.He had disease progression on carboplatin/gemcitabine as well as second line Tecentriq.    Here for on treatment assessment and cycle 2-day 1 of padcev  Proceed with cycle 2-day 1 of Padcev today.  Patient has beach trip planned next week and therefore he will not be receiving cycle 2-day 8 of treatment.  I will see him back in 2 weeks time for cycle 2-day 15  Lunges peripheral neuropathy: I will start him on gabapentin which she will start at 3 mg at night and slowly increase to twice daily and then 3 times daily.  I will reassess symptoms in 2 weeks   Visit Diagnosis 1. Encounter for antineoplastic chemotherapy   2. Urothelial carcinoma of kidney, left ([place])   3. Chemotherapy-induced peripheral neuropathy ([place])      Dr. Randa Evens, MD, MPH [facility] [phone] 02/12/2019 12:34 PM

## 2019-02-18 ENCOUNTER — Other Ambulatory Visit: Payer: Medicare Other

## 2019-02-18 ENCOUNTER — Ambulatory Visit: Payer: Medicare Other

## 2019-02-24 ENCOUNTER — Telehealth: Payer: Self-pay

## 2019-02-24 NOTE — Telephone Encounter (Signed)
Telephone call to patient to schedule TELEHEALTH visit with patient. SW spoke with [name], patient's wife. Patient and [name] in agreement with TELEHEALTH visit on 02-27-19 at 11:00AM.

## 2019-02-25 ENCOUNTER — Ambulatory Visit: Payer: Medicare Other

## 2019-02-25 ENCOUNTER — Other Ambulatory Visit: Payer: Medicare Other

## 2019-02-25 ENCOUNTER — Inpatient Hospital Stay (HOSPITAL_BASED_OUTPATIENT_CLINIC_OR_DEPARTMENT_OTHER): Payer: Medicare Other | Admitting: Oncology

## 2019-02-25 ENCOUNTER — Other Ambulatory Visit: Payer: Self-pay

## 2019-02-25 ENCOUNTER — Inpatient Hospital Stay: Payer: Medicare Other

## 2019-02-25 ENCOUNTER — Encounter: Payer: Self-pay | Admitting: Oncology

## 2019-02-25 ENCOUNTER — Ambulatory Visit: Payer: Medicare Other | Admitting: Oncology

## 2019-02-25 VITALS — Temp 96.0°F | Resp 20

## 2019-02-25 VITALS — BP 110/73 | HR 61 | Wt 153.7 lb

## 2019-02-25 DIAGNOSIS — Z5111 Encounter for antineoplastic chemotherapy: Secondary | ICD-10-CM

## 2019-02-25 DIAGNOSIS — D701 Agranulocytosis secondary to cancer chemotherapy: Secondary | ICD-10-CM

## 2019-02-25 DIAGNOSIS — C642 Malignant neoplasm of left kidney, except renal pelvis: Secondary | ICD-10-CM

## 2019-02-25 DIAGNOSIS — C689 Malignant neoplasm of urinary organ, unspecified: Secondary | ICD-10-CM | POA: Diagnosis not present

## 2019-02-25 DIAGNOSIS — I25119 Atherosclerotic heart disease of native coronary artery with unspecified angina pectoris: Secondary | ICD-10-CM

## 2019-02-25 DIAGNOSIS — T451X5A Adverse effect of antineoplastic and immunosuppressive drugs, initial encounter: Secondary | ICD-10-CM

## 2019-02-25 DIAGNOSIS — C652 Malignant neoplasm of left renal pelvis: Secondary | ICD-10-CM | POA: Diagnosis not present

## 2019-02-25 DIAGNOSIS — Z5112 Encounter for antineoplastic immunotherapy: Secondary | ICD-10-CM | POA: Diagnosis not present

## 2019-02-25 DIAGNOSIS — G620 Drug-induced polyneuropathy: Secondary | ICD-10-CM | POA: Diagnosis not present

## 2019-02-25 DIAGNOSIS — C772 Secondary and unspecified malignant neoplasm of intra-abdominal lymph nodes: Secondary | ICD-10-CM | POA: Diagnosis not present

## 2019-02-25 LAB — CBC WITH DIFFERENTIAL/PLATELET
Abs Immature Granulocytes: 0.01 10*3/uL (ref 0.00–0.07)
Basophils Absolute: 0.1 10*3/uL (ref 0.0–0.1)
Basophils Relative: 1 %
Eosinophils Absolute: 0.2 10*3/uL (ref 0.0–0.5)
Eosinophils Relative: 3 %
HCT: 35.2 % — ABNORMAL LOW (ref 39.0–52.0)
Hemoglobin: 11.7 g/dL — ABNORMAL LOW (ref 13.0–17.0)
Immature Granulocytes: 0 %
Lymphocytes Relative: 22 %
Lymphs Abs: 1.5 10*3/uL (ref 0.7–4.0)
MCH: 28.9 pg (ref 26.0–34.0)
MCHC: 33.2 g/dL (ref 30.0–36.0)
MCV: 86.9 fL (ref 80.0–100.0)
Monocytes Absolute: 0.6 10*3/uL (ref 0.1–1.0)
Monocytes Relative: 8 %
Neutro Abs: 4.5 10*3/uL (ref 1.7–7.7)
Neutrophils Relative %: 66 %
Platelets: 191 10*3/uL (ref 150–400)
RBC: 4.05 MIL/uL — ABNORMAL LOW (ref 4.22–5.81)
RDW: 15.7 % — ABNORMAL HIGH (ref 11.5–15.5)
WBC: 6.8 10*3/uL (ref 4.0–10.5)
nRBC: 0 % (ref 0.0–0.2)

## 2019-02-25 LAB — COMPREHENSIVE METABOLIC PANEL
ALT: 19 U/L (ref 0–44)
AST: 22 U/L (ref 15–41)
Albumin: 4.1 g/dL (ref 3.5–5.0)
Alkaline Phosphatase: 68 U/L (ref 38–126)
Anion gap: 9 (ref 5–15)
BUN: 21 mg/dL (ref 8–23)
CO2: 25 mmol/L (ref 22–32)
Calcium: 9.6 mg/dL (ref 8.9–10.3)
Chloride: 104 mmol/L (ref 98–111)
Creatinine, Ser: 0.74 mg/dL (ref 0.61–1.24)
GFR calc Af Amer: >60 mL/min (ref >60)
GFR calc non Af Amer: >60 mL/min (ref >60)
Glucose, Bld: 166 mg/dL — ABNORMAL HIGH (ref 70–99)
Potassium: 4.1 mmol/L (ref 3.5–5.1)
Sodium: 138 mmol/L (ref 135–145)
Total Bilirubin: 1 mg/dL (ref 0.3–1.2)
Total Protein: 7.5 g/dL (ref 6.5–8.1)

## 2019-02-25 MED ORDER — SODIUM CHLORIDE 0.9 % IV SOLN
1.0000 mg/kg | Freq: Once | INTRAVENOUS | Status: AC
  Administered 2019-02-25: 70 mg via INTRAVENOUS
  Filled 2019-02-25: qty 7

## 2019-02-25 MED ORDER — HEPARIN SOD (PORK) LOCK FLUSH 100 UNIT/ML IV SOLN
500.0000 [IU] | Freq: Once | INTRAVENOUS | Status: AC
  Administered 2019-02-25: 500 [IU] via INTRAVENOUS
  Filled 2019-02-25: qty 5

## 2019-02-25 MED ORDER — DEXAMETHASONE SODIUM PHOSPHATE 10 MG/ML IJ SOLN
10.0000 mg | Freq: Once | INTRAMUSCULAR | Status: AC
  Administered 2019-02-25: 10 mg via INTRAVENOUS
  Filled 2019-02-25: qty 1

## 2019-02-25 MED ORDER — PALONOSETRON HCL INJECTION 0.25 MG/5ML
0.2500 mg | Freq: Once | INTRAVENOUS | Status: AC
  Administered 2019-02-25: 0.25 mg via INTRAVENOUS
  Filled 2019-02-25: qty 5

## 2019-02-25 MED ORDER — SODIUM CHLORIDE 0.9 % IV SOLN
Freq: Once | INTRAVENOUS | Status: AC
  Administered 2019-02-25: 11:00:00 via INTRAVENOUS
  Filled 2019-02-25: qty 250

## 2019-02-25 MED ORDER — SODIUM CHLORIDE 0.9% FLUSH
10.0000 mL | Freq: Once | INTRAVENOUS | Status: AC
  Administered 2019-02-25: 10 mL via INTRAVENOUS
  Filled 2019-02-25: qty 10

## 2019-02-25 NOTE — Progress Notes (Signed)
Pt reports no changes recently, states he hasn't yet started his gabapentin prescribed to him by his PCP.

## 2019-02-25 NOTE — Progress Notes (Signed)
Hematology/Oncology Consult note [facility]  Telephone:([phone] [phone]  Patient Care Team: Jerrol Banana., MD as PCP - General (Family Medicine) Dingeldein, [name], MD as Consulting Physician (Ophthalmology) [name] as Consulting Physician (Internal Medicine) [name], MD as Consulting Physician (Urology) [name], MD as Referring Physician (Hematology and Oncology) [name], MD as Consulting Physician (Pulmonary Disease)   Name of the patient: Patrick Mcgee  [id number]  11/19/1945   Date of visit: 02/25/19  Diagnosis- 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 [name] so far for his metastatic urothelial carcinoma. This was originally diagnosed in May 2019. He was noted to have a filling defect in the lower pole collecting system of the left kidney along with para-aortic and retroperitoneal adenopathy concerning for metastatic disease. He underwent left ureteroscopy and renal pelvis biopsy which revealed small fragments of high-grade urothelial carcinoma with small focus of invasion. He was started on carboplatin and gemcitabine chemotherapy in June 2019 and was continued on 05/27/2018 for 8 cycles. He tolerated chemotherapy well except for chemo-induced anemia for which she has been getting Procrit every 2 weeks. He did have response to his disease based on scans in August 2019. However repeat scan on 05/31/2018 showed increase in the size of the primary tumor from 1.2 to 1.9 cm and increase in left para-aortic adenopathy from 0.9 to 1.3 cm. Periportal adenopathy was stable at 1.4 cm. Second line immunotherapy was recommended. His initial biopsy specimen did not have enough sample to undergo  FGFR mutation testing. He also has mild interstitial lung disease for which he sees pulmonary but he is not on home oxygen. He has B12 deficiency for which he is on oral B12. Also has diabetes and coronary artery disease.Tecentriq started on 06/17/2018.Patient noted to have disease progression in his lymph nodes in May 2020. Repeat biopsy showed metastatic urothelial carcinoma which did not have aFGFR mutation. He has been started on third line Padcev   Interval history-patient continues to have tingling numbness and burning in his bilateral extremities which comes and goes and he has been using Outback topical cream for this.  He reports that cream does give him significant relief and he has not started taking his gabapentin yet.  Reports chronic fatigue but denies other complaints at this time  ECOG PS- 1 Pain scale- 0 Opioid associated constipation- no  Review of systems- Review of Systems  Constitutional: Positive for malaise/fatigue. Negative for chills, fever and weight loss.  HENT: Negative for congestion, ear discharge and nosebleeds.   Eyes: Negative for blurred vision.  Respiratory: Negative for cough, hemoptysis, sputum production, shortness of breath and wheezing.   Cardiovascular: Negative for chest pain, palpitations, orthopnea and claudication.  Gastrointestinal: Negative for abdominal pain, blood in stool, constipation, diarrhea, heartburn, melena, nausea and vomiting.  Genitourinary: Negative for dysuria, flank pain, frequency, hematuria and urgency.  Musculoskeletal: Negative for back pain, joint pain and myalgias.  Skin: Negative for rash.  Neurological: Positive for sensory change (Peripheral neuropathy). Negative for dizziness, tingling, focal weakness, seizures, weakness and headaches.  Endo/Heme/Allergies: Does not bruise/bleed easily.  Psychiatric/Behavioral: Negative for depression and suicidal ideas. The patient does not have insomnia.        Allergies    Allergen Reactions   Sulfa Antibiotics Other (See Comments)  Joint pain   Ace Inhibitors Cough   Invokana [Canagliflozin] Other (See Comments)    Leg pain   Penicillins Rash    Did it involve swelling of the face/tongue/throat, SOB, or low BP? No Did it involve sudden or severe rash/hives, skin peeling, or any reaction on the inside of your mouth or nose? Yes Did you need to seek medical attention at a hospital or doctor's office? Yes When did it last happen?childhood allergy If all above answers are NO, may proceed with cephalosporin use.      Past Medical History:  Diagnosis Date   Acid reflux    Anxiety    Depression    Diabetes mellitus without complication ([place])    Hyperlipidemia    Hypertension    MRSA (methicillin resistant Staphylococcus aureus) infection 1992   history   Myocardial infarction ([place]) 1995   Sleep apnea    CPAP   Urothelial cancer ([place]) 11/2017   Left Urothelial mass, chemo tx's     Past Surgical History:  Procedure Laterality Date   CATARACT EXTRACTION Left    COLONOSCOPY  2010   [facility]   COLONOSCOPY WITH PROPOFOL N/A 10/04/2016   Procedure: COLONOSCOPY WITH PROPOFOL;  Surgeon: [name], MD;  Location: [place];  Service: Endoscopy;  Laterality: N/A;   [place], 2000, 2001, 2014   CYSTOSCOPY W/ RETROGRADES Bilateral 12/10/2017   Procedure: CYSTOSCOPY WITH RETROGRADE PYELOGRAM;  Surgeon: [name], MD;  Location: [facility];  Service: Urology;  Laterality: Bilateral;   CYSTOSCOPY W/ RETROGRADES Left 12/09/2018   Procedure: CYSTOSCOPY WITH RETROGRADE PYELOGRAM;  Surgeon: [name], MD;  Location: [facility];  Service: Urology;  Laterality: Left;   CYSTOSCOPY WITH BIOPSY Left 12/09/2018   Procedure: CYSTOSCOPY WITH URETERAL/RENAL PELVIC BIOPSY;  Surgeon: [name], MD;  Location: [facility];  Service: Urology;  Laterality: Left;   CYSTOSCOPY WITH STENT  PLACEMENT Left 12/10/2017   Procedure: CYSTOSCOPY WITH STENT PLACEMENT;  Surgeon: [name], MD;  Location: [facility];  Service: Urology;  Laterality: Left;   CYSTOSCOPY WITH STENT PLACEMENT Left 12/09/2018   Procedure: CYSTOSCOPY WITH STENT PLACEMENT;  Surgeon: [name], MD;  Location: [facility];  Service: Urology;  Laterality: Left;   CYSTOSCOPY WITH URETEROSCOPY Left 12/09/2018   Procedure: CYSTOSCOPY WITH URETEROSCOPY;  Surgeon: [name], MD;  Location: [facility];  Service: Urology;  Laterality: Left;   EYE SURGERY Bilateral    cataract   INGUINAL HERNIA REPAIR Right 08/09/2015   Procedure: HERNIA REPAIR INGUINAL ADULT;  Surgeon: [name], MD;  Location: [facility];  Service: General;  Laterality: Right;   NASAL SINUS SURGERY     nuclear stress test     PORTA CATH INSERTION N/A 12/19/2017   Procedure: PORTA CATH INSERTION;  Surgeon: [name], MD;  Location: [place] [facility];  Service: Cardiovascular;  Laterality: N/A;   URETERAL BIOPSY Left 12/10/2017   Procedure: URETERAL & renal PELVIS BIOPSY;  Surgeon: [name], MD;  Location: [facility];  Service: Urology;  Laterality: Left;   URETEROSCOPY Left 12/10/2017   Procedure: URETEROSCOPY;  Surgeon: [name], MD;  Location: [facility];  Service: Urology;  Laterality: Left;    Social History   Socioeconomic History   Marital status: Married    Spouse name: [name]   Number of children: 1   Years of education: Not on file   Highest education level: Associate degree: occupational, Hotel manager, or vocational program  Occupational History  Occupation: retired  Scientist, product/process development strain: Not hard at [employer]    Worry: Never true    Inability: Never true   Transportation needs    Medical: No    Non-medical: No  Tobacco Use   Smoking status: Former Smoker    Types: Cigars    Quit date: 10/09/1978    Years since quitting: 40.4   Smokeless tobacco: Former Systems developer     Types: [place] date: 12/04/1988   Tobacco comment: on occasion  Substance and Sexual Activity   Alcohol use: Not Currently   Drug use: No   Sexual activity: Not Currently  Lifestyle   Physical activity    Days per week: 0 days    Minutes per session: 0 min   Stress: Only a little  Relationships   Social connections    Talks on phone: More than three times a week    Gets together: More than three times a week    Attends religious service: More than 4 times per year    Active member of club or organization: Not on file    Attends meetings of clubs or organizations: Not on file    Relationship status: Married   Intimate partner violence    Fear of current or ex partner: No    Emotionally abused: No    Physically abused: No    Forced sexual activity: No  Other Topics Concern   Not on file  Social History Narrative   Not on file    Family History  Problem Relation Age of Onset   Heart disease Mother    Cancer Father        Lung and colon cancer   Heart disease Father    Emphysema Maternal Grandfather    Tuberculosis Maternal Grandmother      Current Outpatient Medications:    aspirin EC 81 MG tablet, Take 81 mg by mouth every evening. , Disp: , Rfl:    atorvastatin (LIPITOR) 40 MG tablet, Take 1 tablet (40 mg total) by mouth at bedtime., Disp: 90 tablet, Rfl: 3   carvedilol (COREG) 12.5 MG tablet, TAKE 1 TABLET BY MOUTH TWICE DAILY (Patient taking differently: Take 12.5 mg by mouth 2 (two) times a day. ), Disp: 180 tablet, Rfl: 3   clobetasol cream (TEMOVATE) 7.61 %, Apply 1 application topically as needed (for skin rash)., Disp: 30 g, Rfl: 1   diphenhydrAMINE (BENADRYL) 25 MG tablet, Take 25 mg by mouth daily as needed for allergies., Disp: , Rfl:    empagliflozin (JARDIANCE) 10 MG TABS tablet, Take 10 mg by mouth daily., Disp: 90 tablet, Rfl: 3   feeding supplement, ENSURE ENLIVE, (ENSURE ENLIVE) LIQD, Take 237 mLs by mouth 3 (three) times  daily between meals. (Patient taking differently: Take 237 mLs by mouth 2 (two) times a week. ), Disp: 90 Bottle, Rfl: 0   fluticasone (FLONASE) 50 MCG/ACT nasal spray, Use 2 spray(s) in each nostril once daily (Patient taking differently: Place 2 sprays into both nostrils daily. ), Disp: 48 g, Rfl: 3   gabapentin (NEURONTIN) 300 MG capsule, Take 1 capsule (300 mg total) by mouth 3 (three) times daily., Disp: 90 capsule, Rfl: 0   glucose blood (CONTOUR NEXT TEST) test strip, Check blood sugars 3 times daily., Disp: 300 each, Rfl: 11   HYDROcodone-acetaminophen (NORCO/VICODIN) 5-325 MG tablet, Take 1-2 tablets by mouth every 6 (six) hours as needed for moderate pain., Disp: 6  tablet, Rfl: 0   hydrOXYzine (ATARAX/VISTARIL) 25 MG tablet, Take 1 tablet (25 mg total) by mouth every 8 (eight) hours as needed for itching., Disp: 30 tablet, Rfl: 3   Ivermectin (SOOLANTRA) 1 % CREA, Apply 1 application topically daily as needed (rosacea). To face, Disp: , Rfl:    lidocaine-prilocaine (EMLA) cream, Apply 1 application topically as needed (port access)., Disp: 1 g, Rfl: 3   losartan (COZAAR) 100 MG tablet, TAKE 1 TABLET BY MOUTH ONCE DAILY (Patient taking differently: Take 100 mg by mouth at bedtime. ), Disp: 90 tablet, Rfl: 3   magnesium hydroxide (MILK OF MAGNESIA) 400 MG/5ML suspension, Take 15 mLs by mouth daily as needed for mild constipation., Disp: , Rfl:    metFORMIN (GLUCOPHAGE) 1000 MG tablet, TAKE 1 TABLET BY MOUTH TWICE DAILY WITH MEALS (Patient taking differently: Take 1,000 mg by mouth 2 (two) times a day. ), Disp: 180 tablet, Rfl: 3   nystatin cream (MYCOSTATIN), Apply topically 2 (two) times daily. (Patient taking differently: Apply 1 application topically 2 (two) times daily as needed (rash). ), Disp: 15 g, Rfl: 1   OVER THE COUNTER MEDICATION, Apply 1 application topically daily as needed (pain). Outback topical pain relief, Disp: , Rfl:    oxybutynin (DITROPAN) 5 MG tablet, Take 1  tablet (5 mg total) by mouth every 8 (eight) hours as needed for bladder spasms., Disp: 30 tablet, Rfl: 0   sertraline (ZOLOFT) 100 MG tablet, Take 1 tablet (100 mg total) by mouth daily., Disp: 90 tablet, Rfl: 3   tamsulosin (FLOMAX) 0.4 MG CAPS capsule, Take 1 capsule (0.4 mg total) by mouth daily., Disp: 14 capsule, Rfl: 0   vitamin B-12 (CYANOCOBALAMIN) 1000 MCG tablet, Take 1,000 mcg by mouth daily., Disp: , Rfl:    vitamin C (ASCORBIC ACID) 500 MG tablet, Take 1,000-1,500 mg by mouth daily as needed (immune support)., Disp: , Rfl:  No current facility-administered medications for this visit.   Facility-Administered Medications Ordered in Other Visits:    Influenza vac split quadrivalent PF (FLUZONE HIGH-DOSE) injection 0.5 mL, 0.5 mL, Intramuscular, Once, Karen Kitchens, NP  Physical exam:  Vitals:   02/25/19 0955  BP: 110/73  Pulse: 61  Weight: 153 lb 11.2 oz (69.7 kg)   Physical Exam Constitutional:      Comments: Thin elderly gentleman in no acute distress  HENT:     Head: Normocephalic and atraumatic.  Eyes:     Pupils: Pupils are equal, round, and reactive to light.  Neck:     Musculoskeletal: Normal range of motion.  Cardiovascular:     Rate and Rhythm: Normal rate and regular rhythm.     Heart sounds: Normal heart sounds.  Pulmonary:     Effort: Pulmonary effort is normal.     Breath sounds: Normal breath sounds.  Abdominal:     General: Bowel sounds are normal.     Palpations: Abdomen is soft.  Skin:    General: Skin is warm and dry.  Neurological:     Mental Status: He is alert and oriented to person, place, and time.      CMP Latest Ref Rng & Units 02/25/2019  Glucose 70 - 99 mg/dL 166(H)  BUN 8 - 23 mg/dL 21  Creatinine 0.61 - 1.24 mg/dL 0.74  Sodium 135 - 145 mmol/L 138  Potassium 3.5 - 5.1 mmol/L 4.1  Chloride 98 - 111 mmol/L 104  CO2 22 - 32 mmol/L 25  Calcium 8.9 - 10.3 mg/dL 9.6  Total Protein 6.5 - 8.1 g/dL 7.5  Total Bilirubin 0.3 - 1.2  mg/dL 1.0  Alkaline Phos 38 - 126 U/L 68  AST 15 - 41 U/L 22  ALT 0 - 44 U/L 19   CBC Latest Ref Rng & Units 02/25/2019  WBC 4.0 - 10.5 K/uL 6.8  Hemoglobin 13.0 - 17.0 g/dL 11.7(L)  Hematocrit 39.0 - 52.0 % 35.2(L)  Platelets 150 - 400 K/uL 191      Assessment and plan- Patient is a 73 y.o. male with history of metastatic urothelial carcinoma with metastases to the lymph nodes.He had disease progression on carboplatin/gemcitabine as well as second line Tecentriq.He is here for on treatment assessment prior to cycle 2-day 8 of Padcev  Patient missed his dose last week due to his beach trip.  He will therefore be getting his cycle 2-day 8 Padcev today. He will directly proceed for cycle 2-day 15 next week.  I will see him back in 3 weeks for cycle 3-day 1 of padcev.  I will plan to repeat scans after 3 cycles.  CBC CMP are essentially stable.  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  He does have a prescription for gabapentin with him but he did not want to use it yet.  He continues to use Altabax topical cream which he has been getting over-the-counter and says it has been helping him with his neuropathic pain   Visit Diagnosis 1. Urothelial cancer ([place])   2. Encounter for antineoplastic chemotherapy   3. Chemotherapy-induced peripheral neuropathy ([facility])      Dr. Randa Evens, MD, MPH [facility] [phone] 02/25/2019 1:16 PM

## 2019-02-27 ENCOUNTER — Other Ambulatory Visit: Payer: Self-pay

## 2019-02-27 ENCOUNTER — Other Ambulatory Visit: Payer: Medicare Other

## 2019-02-27 DIAGNOSIS — Z515 Encounter for palliative care: Secondary | ICD-10-CM

## 2019-02-27 NOTE — Progress Notes (Signed)
PATIENT NAME: Patrick Mcgee DOB: [birth year] MRN: [id number]  PRIMARY CARE PROVIDER: Jerrol Banana., MD  RESPONSIBLE PARTY:  Acct ID - Guarantor Home Phone Work Phone Relationship Acct Type  [id number] Patrick Mcgee [phone]  Self P/F     [name], [name], [name]    PLAN OF CARE and INTERVENTIONS:               1.  GOALS OF CARE/ ADVANCE CARE PLANNING:  Remain at home with his wife Patrick Mcgee and continue receiving chemo.                2.  PATIENT/CAREGIVER EDUCATION:  Education on fall precautions (patient having neuropathy due to chemo), education on s/s of infection, education on Gabapentin to aide with neuropathy, reviewed meds.                  3.  DISEASE STATUS: Patient is a 73 year old patient under going chemo for metastatic urothelial carcinoma.      HISTORY OF PRESENT ILLNESS:   SW and RN met with patient and wife for Telehealth visit. Patient and wife report they had a relaxing week at the beach last week. Patient reports he has developed numbness in his hands and feet with receiving new chemo treatment. Patient also reports he developed blisters on his feet along with redness while riding to the beach. Patient states he has cream he has been using that provides relief. Oncologist gave patient RX for gabapentin 300 mg capsules to be taken 1 cap three times a day. Patient reluctant to add any more medications to what he already takes.  Education to patient if he is having numbness which patient reports is worse at night to try taking Gabapentin at bedtime. Patient continues to have a strong faith and reports he feels blessed.  Patient states he told his family while at the beach he was considering stopping treatment.  Patient reports he has had no pain or nausea. Patient reports a 5 lb weight loss. Patient reports he has been sleeping well and slept well at the beach. Patient's reports he largest complaint is feeling weak and not having energy. Patient reports he  eats at least two decent meals per day but has no appetite or does not feel hungry. Patient reports he is having increased edema in his lower extremities, left greater than the right. Patient to continues to receive chemo weekly. Patrick Mcgee reports patient is to have another scan mid-september to determine if current chemo is working. Patient and wife share with palliative team they celebrated their 50th wedding anniversary last Sunday. Patient and wife verbalize appreciation for palliative care team support. Support provided to patient and wife and both were encouraged to contact palliative care with questions or concerns.    CODE STATUS: Full Code  ADVANCED DIRECTIVES: N MOST FORM: No PPS: 50%   PHYSICAL EXAM:   VITALS: Today's Vitals   02/27/19 1146  Weight: 153 lb (69.4 kg)  PainSc: 0-No pain    LUNGS: Shortness of breath CARDIAC:  EXTREMITIES: 2+ edema SKIN: Skin color, texture, turgor normal. No rashes or lesions  NEURO: positive for weakness       Patrick Simmer, RN

## 2019-02-27 NOTE — Progress Notes (Signed)
COMMUNITY PALLIATIVE CARE SW NOTE  PATIENT NAME: Patrick Mcgee DOB: 06-21-1946 MRN: [id number]  PRIMARY CARE PROVIDER: Jerrol Banana., MD  RESPONSIBLE PARTY:  Acct ID - Guarantor Home Phone Work Phone Relationship Acct Type  [id number] [place], BEHRLE [phone]  Self P/F     [address], [place], [place] [address]     PLAN OF CARE and INTERVENTIONS:             1. GOALS OF CARE/ ADVANCE CARE PLANNING:  Patient's goal is to continue with treatment at this time.Patient is a FULL CODE. Discussion to continue. 2. SOCIAL/EMOTIONAL/SPIRITUAL ASSESSMENT/ INTERVENTIONS:  SW and RN completed TELEHEALTH visit with patient and [name] (patient's wife). Patient reports doing "okay". Patient was alert, engaged in conversation. Patient discussed continued treatments. Patient said he is thankful to have pain or nausea. Patient noted that he has experienced neuropathic numbness and tingling, but is using Altabax cream and this seems to help. Patient said he feels that he sleeps 8 hours or more per day. Patient said he does have chronic fatigue. Balance is off and difficulty walking. Patient said "I don't get hungry", but [name] tries to make foods that patient enjoys. Patient talked about his trip to the beach with his family last week, and how much he enjoyed this. Patient and [name] also celebrated their 50th anniversary this weekend. 3. PATIENT/CAREGIVER EDUCATION/ COPING:  Patient and [name] are coping well. Patient talked about his faith in God and support of family.  4. PERSONAL EMERGENCY PLAN:  Patient and family will call 9-1-1 for emergencies.Due to COVID-19 crisis, patient verbalized that he is remaining at home, other than doctor appointments. 5. COMMUNITY RESOURCES COORDINATION/ HEALTH CARE NAVIGATION:  Per wife, MD is looking to schedule another scan mid-September. [name] helps coordinate all of patient's care. Patient is also scheduled for Medicare wellness visit in September. No  concerns. 6. FINANCIAL/LEGAL CONCERNS/INTERVENTIONS:  None.     SOCIAL HX:  Social History   Tobacco Use  . Smoking status: Former Smoker    Types: Cigars    Quit date: 10/09/1978    Years since quitting: 40.4  . Smokeless tobacco: Former Systems developer    Types: Chew    Quit date: 12/04/1988  . Tobacco comment: on occasion  Substance Use Topics  . Alcohol use: Not Currently    CODE STATUS:   Code Status: Prior  ADVANCED DIRECTIVES: N MOST FORM COMPLETE:  No. HOSPICE EDUCATION PROVIDED: None.  PPS: Patient is independent of all ADLs.  Due to the COVID-19 crisis, this visit was done viaZoomfrom my office and it was initiated and consent by this patient and/or family.This was a scheduled visit.  I spent52minutes with patient/family, [id number] education, support and consultation.   Margaretmary Lombard, LCSW

## 2019-02-28 DIAGNOSIS — R0982 Postnasal drip: Secondary | ICD-10-CM | POA: Diagnosis not present

## 2019-02-28 DIAGNOSIS — G4733 Obstructive sleep apnea (adult) (pediatric): Secondary | ICD-10-CM | POA: Diagnosis not present

## 2019-02-28 DIAGNOSIS — J340 Abscess, furuncle and carbuncle of nose: Secondary | ICD-10-CM | POA: Diagnosis not present

## 2019-03-03 ENCOUNTER — Observation Stay
Admission: EM | Admit: 2019-03-03 | Discharge: 2019-03-05 | Disposition: A | Discharge status: Home / Self Care | Payer: Medicare Other | Attending: Internal Medicine | Admitting: Internal Medicine

## 2019-03-03 ENCOUNTER — Other Ambulatory Visit: Payer: Self-pay

## 2019-03-03 ENCOUNTER — Emergency Department: Payer: Medicare Other

## 2019-03-03 ENCOUNTER — Encounter: Payer: Self-pay | Admitting: Emergency Medicine

## 2019-03-03 DIAGNOSIS — E785 Hyperlipidemia, unspecified: Secondary | ICD-10-CM | POA: Diagnosis not present

## 2019-03-03 DIAGNOSIS — R079 Chest pain, unspecified: Secondary | ICD-10-CM | POA: Diagnosis present

## 2019-03-03 DIAGNOSIS — Z7984 Long term (current) use of oral hypoglycemic drugs: Secondary | ICD-10-CM | POA: Insufficient documentation

## 2019-03-03 DIAGNOSIS — Z87891 Personal history of nicotine dependence: Secondary | ICD-10-CM | POA: Insufficient documentation

## 2019-03-03 DIAGNOSIS — I252 Old myocardial infarction: Secondary | ICD-10-CM | POA: Insufficient documentation

## 2019-03-03 DIAGNOSIS — F419 Anxiety disorder, unspecified: Secondary | ICD-10-CM | POA: Diagnosis not present

## 2019-03-03 DIAGNOSIS — Z801 Family history of malignant neoplasm of trachea, bronchus and lung: Secondary | ICD-10-CM | POA: Insufficient documentation

## 2019-03-03 DIAGNOSIS — F329 Major depressive disorder, single episode, unspecified: Secondary | ICD-10-CM | POA: Insufficient documentation

## 2019-03-03 DIAGNOSIS — E1136 Type 2 diabetes mellitus with diabetic cataract: Secondary | ICD-10-CM | POA: Diagnosis not present

## 2019-03-03 DIAGNOSIS — K219 Gastro-esophageal reflux disease without esophagitis: Secondary | ICD-10-CM | POA: Diagnosis not present

## 2019-03-03 DIAGNOSIS — E039 Hypothyroidism, unspecified: Secondary | ICD-10-CM | POA: Diagnosis not present

## 2019-03-03 DIAGNOSIS — R072 Precordial pain: Secondary | ICD-10-CM | POA: Diagnosis not present

## 2019-03-03 DIAGNOSIS — Z882 Allergy status to sulfonamides status: Secondary | ICD-10-CM | POA: Insufficient documentation

## 2019-03-03 DIAGNOSIS — C679 Malignant neoplasm of bladder, unspecified: Secondary | ICD-10-CM | POA: Insufficient documentation

## 2019-03-03 DIAGNOSIS — I2510 Atherosclerotic heart disease of native coronary artery without angina pectoris: Secondary | ICD-10-CM | POA: Diagnosis not present

## 2019-03-03 DIAGNOSIS — E538 Deficiency of other specified B group vitamins: Secondary | ICD-10-CM | POA: Diagnosis not present

## 2019-03-03 DIAGNOSIS — Z8249 Family history of ischemic heart disease and other diseases of the circulatory system: Secondary | ICD-10-CM | POA: Insufficient documentation

## 2019-03-03 DIAGNOSIS — J849 Interstitial pulmonary disease, unspecified: Secondary | ICD-10-CM | POA: Diagnosis not present

## 2019-03-03 DIAGNOSIS — Z881 Allergy status to other antibiotic agents status: Secondary | ICD-10-CM | POA: Insufficient documentation

## 2019-03-03 DIAGNOSIS — Z7902 Long term (current) use of antithrombotics/antiplatelets: Secondary | ICD-10-CM | POA: Insufficient documentation

## 2019-03-03 DIAGNOSIS — G4730 Sleep apnea, unspecified: Secondary | ICD-10-CM | POA: Insufficient documentation

## 2019-03-03 DIAGNOSIS — Z955 Presence of coronary angioplasty implant and graft: Secondary | ICD-10-CM | POA: Diagnosis not present

## 2019-03-03 DIAGNOSIS — Z880 Allergy status to penicillin: Secondary | ICD-10-CM | POA: Insufficient documentation

## 2019-03-03 DIAGNOSIS — R071 Chest pain on breathing: Principal | ICD-10-CM | POA: Insufficient documentation

## 2019-03-03 DIAGNOSIS — R0789 Other chest pain: Secondary | ICD-10-CM | POA: Diagnosis not present

## 2019-03-03 DIAGNOSIS — I10 Essential (primary) hypertension: Secondary | ICD-10-CM | POA: Diagnosis not present

## 2019-03-03 DIAGNOSIS — Z888 Allergy status to other drugs, medicaments and biological substances status: Secondary | ICD-10-CM | POA: Insufficient documentation

## 2019-03-03 DIAGNOSIS — Z20828 Contact with and (suspected) exposure to other viral communicable diseases: Secondary | ICD-10-CM | POA: Diagnosis not present

## 2019-03-03 DIAGNOSIS — Z7982 Long term (current) use of aspirin: Secondary | ICD-10-CM | POA: Diagnosis not present

## 2019-03-03 DIAGNOSIS — M25512 Pain in left shoulder: Secondary | ICD-10-CM | POA: Diagnosis not present

## 2019-03-03 DIAGNOSIS — Z79899 Other long term (current) drug therapy: Secondary | ICD-10-CM | POA: Diagnosis not present

## 2019-03-03 DIAGNOSIS — M7918 Myalgia, other site: Secondary | ICD-10-CM | POA: Insufficient documentation

## 2019-03-03 DIAGNOSIS — C642 Malignant neoplasm of left kidney, except renal pelvis: Secondary | ICD-10-CM | POA: Diagnosis not present

## 2019-03-03 DIAGNOSIS — Z800 Family history of malignant neoplasm of digestive organs: Secondary | ICD-10-CM | POA: Insufficient documentation

## 2019-03-03 LAB — CBC WITH DIFFERENTIAL/PLATELET
Abs Immature Granulocytes: 0.02 10*3/uL (ref 0.00–0.07)
Basophils Absolute: 0 10*3/uL (ref 0.0–0.1)
Basophils Relative: 0 %
Eosinophils Absolute: 0.3 10*3/uL (ref 0.0–0.5)
Eosinophils Relative: 3 %
HCT: 35.1 % — ABNORMAL LOW (ref 39.0–52.0)
Hemoglobin: 11.5 g/dL — ABNORMAL LOW (ref 13.0–17.0)
Immature Granulocytes: 0 %
Lymphocytes Relative: 14 %
Lymphs Abs: 1.4 10*3/uL (ref 0.7–4.0)
MCH: 29 pg (ref 26.0–34.0)
MCHC: 32.8 g/dL (ref 30.0–36.0)
MCV: 88.4 fL (ref 80.0–100.0)
Monocytes Absolute: 0.9 10*3/uL (ref 0.1–1.0)
Monocytes Relative: 9 %
Neutro Abs: 7.3 10*3/uL (ref 1.7–7.7)
Neutrophils Relative %: 74 %
Platelets: 206 10*3/uL (ref 150–400)
RBC: 3.97 MIL/uL — ABNORMAL LOW (ref 4.22–5.81)
RDW: 15.7 % — ABNORMAL HIGH (ref 11.5–15.5)
WBC: 9.9 10*3/uL (ref 4.0–10.5)
nRBC: 0 % (ref 0.0–0.2)

## 2019-03-03 LAB — COMPREHENSIVE METABOLIC PANEL
ALT: 20 U/L (ref 0–44)
AST: 22 U/L (ref 15–41)
Albumin: 3.8 g/dL (ref 3.5–5.0)
Alkaline Phosphatase: 70 U/L (ref 38–126)
Anion gap: 10 (ref 5–15)
BUN: 15 mg/dL (ref 8–23)
CO2: 24 mmol/L (ref 22–32)
Calcium: 9.3 mg/dL (ref 8.9–10.3)
Chloride: 101 mmol/L (ref 98–111)
Creatinine, Ser: 0.52 mg/dL — ABNORMAL LOW (ref 0.61–1.24)
GFR calc Af Amer: >60 mL/min (ref >60)
GFR calc non Af Amer: >60 mL/min (ref >60)
Glucose, Bld: 172 mg/dL — ABNORMAL HIGH (ref 70–99)
Potassium: 4.2 mmol/L (ref 3.5–5.1)
Sodium: 135 mmol/L (ref 135–145)
Total Bilirubin: 0.9 mg/dL (ref 0.3–1.2)
Total Protein: 7 g/dL (ref 6.5–8.1)

## 2019-03-03 LAB — TROPONIN I (HIGH SENSITIVITY): Troponin I (High Sensitivity): 6 ng/L (ref <18)

## 2019-03-03 MED ORDER — ONDANSETRON HCL 4 MG/2ML IJ SOLN
4.0000 mg | Freq: Once | INTRAMUSCULAR | Status: AC
  Administered 2019-03-03: 4 mg via INTRAVENOUS
  Filled 2019-03-03: qty 2

## 2019-03-03 MED ORDER — MORPHINE SULFATE (PF) 4 MG/ML IV SOLN
4.0000 mg | Freq: Once | INTRAVENOUS | Status: AC
  Administered 2019-03-03: 4 mg via INTRAVENOUS
  Filled 2019-03-03: qty 1

## 2019-03-03 MED ORDER — IOHEXOL 350 MG/ML SOLN
75.0000 mL | Freq: Once | INTRAVENOUS | Status: AC | PRN
  Administered 2019-03-03: 22:00:00 75 mL via INTRAVENOUS

## 2019-03-03 NOTE — ED Provider Notes (Signed)
[facility] Emergency Department Provider Note ____________________________________________   First MD Initiated Contact with Patient 03/03/19 2038     (approximate)  I have reviewed the triage vital signs and the nursing notes.   HISTORY  Chief Complaint Chest Pain    HPI Patrick Mcgee is a 73 y.o. male with PMH as noted below including stage IV bladder cancer who presents with chest pain, acute onset around 4 PM, described as sharp and worst around his left shoulder but radiating across the upper part of his chest.  It is significantly worse with deep inspiration although he does not feel short of breath.  He denies any lightheadedness or nausea.  He denies any prior history of this pain.  He has not had any recent leg pain or swelling.  He denies fever, cough, or any vomiting or diarrhea.  The patient reports that he has several stents and has had an MI in the past.  Past Medical History:  Diagnosis Date  . Acid reflux   . Anxiety   . Depression   . Diabetes mellitus without complication ([place])   . Hyperlipidemia   . Hypertension   . MRSA (methicillin resistant Staphylococcus aureus) infection 1992   history  . Myocardial infarction ([place]) 1995  . Sleep apnea    CPAP  . 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 Anemia due to antineoplastic chemotherapy 03/03/2018  . Cough 03/03/2018  . Chemotherapy induced neutropenia ([place]) 01/22/2018  . B12 deficiency 01/01/2018  . Hypomagnesemia 12/21/2017  . Encounter for antineoplastic chemotherapy 12/21/2017  . Malignant neoplasm of kidney ([place])   . Urothelial cancer ([place]) 12/18/2017  . Malnutrition of moderate degree 12/18/2017  . Therapeutic opioid induced constipation   . Palliative care encounter   . Dehydration 12/17/2017  . Goals of care, counseling/discussion 12/13/2017  . Urothelial carcinoma of kidney, left  ([place]) 12/07/2017  . Right inguinal hernia 07/17/2015  . Family history of colon cancer 07/17/2015  . Allergic rhinitis 11/12/2014  . Airway hyperreactivity 11/12/2014  . Atherosclerosis of coronary artery 11/12/2014  . Cheilitis 11/12/2014  . Narrowing of intervertebral disc space 11/12/2014  . Deflected nasal septum 11/12/2014  . HLD (hyperlipidemia) 11/12/2014  . BP (high blood pressure) 11/12/2014  . Adult hypothyroidism 11/12/2014  . Sleep apnea 11/12/2014  . Diabetes mellitus, type 2 ([place]) 11/12/2014  . Degenerative disc disease, lumbar 09/24/2012  . Furunculosis 04/23/2012    Past Surgical History:  Procedure Laterality Date  . CATARACT EXTRACTION Left   . COLONOSCOPY  2010   [facility]  . COLONOSCOPY WITH PROPOFOL N/A 10/04/2016   Procedure: COLONOSCOPY WITH PROPOFOL;  Surgeon: [name], MD;  Location: [facility] ENDOSCOPY;  Service: Endoscopy;  Laterality: N/A;  . [place], 2000, 2001, 2014  . CYSTOSCOPY W/ RETROGRADES Bilateral 12/10/2017   Procedure: CYSTOSCOPY WITH RETROGRADE PYELOGRAM;  Surgeon: [name], MD;  Location: [facility] ORS;  Service: Urology;  Laterality: Bilateral;  . CYSTOSCOPY W/ RETROGRADES Left 12/09/2018   Procedure: CYSTOSCOPY WITH RETROGRADE PYELOGRAM;  Surgeon: [name], MD;  Location: [facility] ORS;  Service: Urology;  Laterality: Left;  . CYSTOSCOPY WITH BIOPSY Left 12/09/2018   Procedure: CYSTOSCOPY WITH URETERAL/RENAL PELVIC BIOPSY;  Surgeon: [name], MD;  Location: [facility] ORS;  Service: Urology;  Laterality: Left;  . CYSTOSCOPY WITH STENT PLACEMENT Left 12/10/2017   Procedure: CYSTOSCOPY WITH STENT  PLACEMENT;  Surgeon: [name], MD;  Location: [facility] ORS;  Service: Urology;  Laterality: Left;  . CYSTOSCOPY WITH STENT PLACEMENT Left 12/09/2018   Procedure: CYSTOSCOPY WITH STENT PLACEMENT;  Surgeon: [name], MD;  Location: [facility] ORS;  Service: Urology;  Laterality: Left;  . CYSTOSCOPY WITH  URETEROSCOPY Left 12/09/2018   Procedure: CYSTOSCOPY WITH URETEROSCOPY;  Surgeon: [name], MD;  Location: [facility] ORS;  Service: Urology;  Laterality: Left;  . EYE SURGERY Bilateral    cataract  . INGUINAL HERNIA REPAIR Right 08/09/2015   Procedure: HERNIA REPAIR INGUINAL ADULT;  Surgeon: [name], MD;  Location: [facility] ORS;  Service: General;  Laterality: Right;  . NASAL SINUS SURGERY    . nuclear stress test    . PORTA CATH INSERTION N/A 12/19/2017   Procedure: PORTA CATH INSERTION;  Surgeon: [name], MD;  Location: [place] CV LAB;  Service: Cardiovascular;  Laterality: N/A;  . URETERAL BIOPSY Left 12/10/2017   Procedure: URETERAL & renal PELVIS BIOPSY;  Surgeon: [name], MD;  Location: [facility] ORS;  Service: Urology;  Laterality: Left;  . URETEROSCOPY Left 12/10/2017   Procedure: URETEROSCOPY;  Surgeon: [name], MD;  Location: [facility] ORS;  Service: Urology;  Laterality: Left;    Prior to Admission medications   Medication Sig Start Date End Date Taking? Authorizing Provider  aspirin EC 81 MG tablet Take 81 mg by mouth every evening.     [provider]  atorvastatin (LIPITOR) 40 MG tablet Take 1 tablet (40 mg total) by mouth at bedtime. 03/24/18   Jerrol Banana., MD  carvedilol (COREG) 12.5 MG tablet TAKE 1 TABLET BY MOUTH TWICE DAILY Patient taking differently: Take 12.5 mg by mouth 2 (two) times a day.  06/24/18   Jerrol Banana., MD  clobetasol cream (TEMOVATE) [id number] % Apply 1 application topically as needed (for skin rash). 01/01/18   Jerrol Banana., MD  diphenhydrAMINE (BENADRYL) 25 MG tablet Take 25 mg by mouth daily as needed for allergies.    [provider]  empagliflozin (JARDIANCE) 10 MG TABS tablet Take 10 mg by mouth daily. 01/29/19   Jerrol Banana., MD  feeding supplement, ENSURE ENLIVE, (ENSURE ENLIVE) LIQD Take 237 mLs by mouth 3 (three) times daily between meals. Patient taking differently: Take 237  mLs by mouth 2 (two) times a week.  12/19/17   Salary, Avel Peace, MD  fluticasone (FLONASE) 50 MCG/ACT nasal spray Use 2 spray(s) in each nostril once daily Patient taking differently: Place 2 sprays into both nostrils daily.  09/25/18   Jerrol Banana., MD  gabapentin (NEURONTIN) 300 MG capsule Take 1 capsule (300 mg total) by mouth 3 (three) times daily. 02/12/19 03/14/19  Sindy Guadeloupe, MD  glucose blood (CONTOUR NEXT TEST) test strip Check blood sugars 3 times daily. 11/26/18   Jerrol Banana., MD  HYDROcodone-acetaminophen (NORCO/VICODIN) 5-325 MG tablet Take 1-2 tablets by mouth every 6 (six) hours as needed for moderate pain. 12/09/18   [name], MD  hydrOXYzine (ATARAX/VISTARIL) 25 MG tablet Take 1 tablet (25 mg total) by mouth every 8 (eight) hours as needed for itching. 06/05/18   Jerrol Banana., MD  Ivermectin (SOOLANTRA) 1 % CREA Apply 1 application topically daily as needed (rosacea). To face    [provider]  lidocaine-prilocaine (EMLA) cream Apply 1 application topically as needed (port access). 12/24/18   Sindy Guadeloupe, MD  losartan (COZAAR) 100 MG tablet  TAKE 1 TABLET BY MOUTH ONCE DAILY Patient taking differently: Take 100 mg by mouth at bedtime.  06/10/18   Jerrol Banana., MD  magnesium hydroxide (MILK OF MAGNESIA) 400 MG/5ML suspension Take 15 mLs by mouth daily as needed for mild constipation.    [provider]  metFORMIN (GLUCOPHAGE) 1000 MG tablet TAKE 1 TABLET BY MOUTH TWICE DAILY WITH MEALS Patient taking differently: Take 1,000 mg by mouth 2 (two) times a day.  06/10/18   Jerrol Banana., MD  nystatin cream (MYCOSTATIN) Apply topically 2 (two) times daily. Patient taking differently: Apply 1 application topically 2 (two) times daily as needed (rash).  04/24/18   Jerrol Banana., MD  OVER THE COUNTER MEDICATION Apply 1 application topically daily as needed (pain). Outback topical pain relief    [provider]  oxybutynin (DITROPAN) 5 MG tablet Take 1 tablet (5 mg total) by mouth every 8 (eight) hours as needed for bladder spasms. 12/09/18   [name], MD  sertraline (ZOLOFT) 100 MG tablet Take 1 tablet (100 mg total) by mouth daily. 12/18/18   Jerrol Banana., MD  tamsulosin (FLOMAX) 0.4 MG CAPS capsule Take 1 capsule (0.4 mg total) by mouth daily. 12/09/18   [name], MD  vitamin B-12 (CYANOCOBALAMIN) 1000 MCG tablet Take 1,000 mcg by mouth daily.    [provider]  vitamin C (ASCORBIC ACID) 500 MG tablet Take 1,000-1,500 mg by mouth daily as needed (immune support).    [provider]    Allergies Sulfa antibiotics, Ace inhibitors, Invokana [canagliflozin], and Penicillins  Family History  Problem Relation Age of Onset  . Heart disease Mother   . Cancer Father        Lung and colon cancer  . Heart disease Father   . Emphysema Maternal Grandfather   . Tuberculosis Maternal Grandmother     Social History Social History   Tobacco Use  . Smoking status: Former Smoker    Types: Cigars    Quit date: 10/09/1978    Years since quitting: 40.4  . Smokeless tobacco: Former Systems developer    Types: Chew    Quit date: 12/04/1988  . Tobacco comment: on occasion  Substance Use Topics  . Alcohol use: Not Currently  . Drug use: No    Review of Systems  Constitutional: No fever. Eyes: No redness. ENT: No neck pain. Cardiovascular: Positive for chest pain. Respiratory: Denies shortness of breath. Gastrointestinal: No vomiting or diarrhea.  Genitourinary: Negative for flank pain.  Musculoskeletal: Negative for back pain. Skin: Negative for rash. Neurological: Negative for headache.   ____________________________________________   PHYSICAL EXAM:  VITAL SIGNS: ED Triage Vitals  Enc Vitals Group     BP 03/03/19 2026 (!) 144/80     Pulse Rate 03/03/19 2026 72     Resp 03/03/19 2026 18     Temp 03/03/19 2026 98.2 F (36.8 C)     Temp Source  03/03/19 2026 Oral     SpO2 03/03/19 2026 99 %     Weight 03/03/19 2027 153 lb (69.4 kg)     Height 03/03/19 2027 5\' 8"  (1.727 m)     Head Circumference --      Peak Flow --      Pain Score 03/03/19 2027 7     Pain Loc --      Pain Edu? --      Excl. in [place]? --     Constitutional: Alert and oriented.  Uncomfortable appearing but in no acute distress. Eyes: Conjunctivae are normal.  Head: Atraumatic. Nose: No congestion/rhinnorhea. Mouth/Throat: Mucous membranes are moist.   Neck: Normal range of motion.  Cardiovascular: Normal rate, regular rhythm. Good peripheral circulation. Respiratory: Normal respiratory effort.  No retractions.  Gastrointestinal: No distention.  Musculoskeletal: No lower extremity edema.  No calf or popliteal swelling or tenderness.  Extremities warm and well perfused.  Neurologic:  Normal speech and language. No gross focal neurologic deficits are appreciated.  Skin:  Skin is warm and dry. No rash noted. Psychiatric: Mood and affect are normal. Speech and behavior are normal.  ____________________________________________   LABS (all labs ordered are listed, but only abnormal results are displayed)  Labs Reviewed  CBC WITH DIFFERENTIAL/PLATELET - Abnormal; Notable for the following components:      Result Value   RBC 3.97 (*)    Hemoglobin 11.5 (*)    HCT 35.1 (*)    RDW 15.7 (*)    All other components within normal limits  COMPREHENSIVE METABOLIC PANEL - Abnormal; Notable for the following components:   Glucose, Bld 172 (*)    Creatinine, Ser 0.52 (*)    All other components within normal limits  TROPONIN I (HIGH SENSITIVITY)  TROPONIN I (HIGH SENSITIVITY)   ____________________________________________  EKG  ED ECG REPORT I, Arta Silence, the attending physician, personally viewed and interpreted this ECG.  Date: 03/03/2019 EKG Time: 2112 Rate: 69 Rhythm: normal sinus rhythm QRS Axis: normal Intervals: Nonspecific IVCD ST/T Wave  abnormalities: normal Narrative Interpretation: no evidence of acute ischemia  ____________________________________________  RADIOLOGY  CXR: Chronic appearing interstitial opacities throughout the lungs CT angio chest: No acute PE or dissection.  No other acute abnormality  ____________________________________________   PROCEDURES  Procedure(s) performed: No  Procedures  Critical Care performed: No ____________________________________________   INITIAL IMPRESSION / ASSESSMENT AND PLAN / ED COURSE  Pertinent labs & imaging results that were available during my care of the patient were reviewed by me and considered in my medical decision making (see chart for details).  73 year old male with PMH as noted above including bladder cancer as well as CAD with multiple stents and prior MI (The patient states that he previously followed with a cardiologist at [facility] but has not been there in some time) presents with acute onset of chest pain since this afternoon which has remained constant since that time.  It is pleuritic but not associated with significant shortness of breath or lightheadedness.  I reviewed the past medical records in [place].  I confirmed the history of metastatic urothelial carcinoma currently receiving chemotherapy.  He has had no recent hospital admissions or cardiac events.  On exam, the patient is uncomfortable appearing but in no acute distress.  His vital signs are normal.  The physical exam is unremarkable with no significant chest wall tenderness and no leg tenderness or swelling.  EKG is nonischemic.  Overall the pain is somewhat atypical for ACS although the patient is at significantly increased risk of ACS due to his CAD history and multiple stents.  Given the cancer history, I am also concerned for pulmonary embolism although he has no signs or symptoms of DVT.  Differential would also include musculoskeletal pain or GI etiology.  Pain is not radiating to the back  and given his stable vital signs, I have a very low suspicion for aortic dissection or other vascular etiology.  EKG and initial labs are unremarkable.  The first troponin is negative.  We will obtain  a CT chest to rule out PE, give analgesia, and plan for a second troponin after 2 hours.  ----------------------------------------- 11:18 PM on 03/03/2019 -----------------------------------------  CT chest is negative.  The patient did not have significant relief with the morphine.  We will obtain a repeat troponin and give some additional pain medication.  Although my suspicion for ACS is quite low, if the patient has refractory chest pain, given his CAD history he should be admitted for observation.  I am signing him out to the oncoming physician Dr. Beather Arbour. ____________________________________________   FINAL CLINICAL IMPRESSION(S) / ED DIAGNOSES  Final diagnoses:  Chest pain, unspecified type      NEW MEDICATIONS STARTED DURING THIS VISIT:  New Prescriptions   No medications on file     Note:  This document was prepared using Dragon voice recognition software and may include unintentional dictation errors.    Arta Silence, MD 03/03/19 2318

## 2019-03-03 NOTE — ED Triage Notes (Addendum)
Patient ambulatory to triage with steady gait, without difficulty or distress noted, mask in place; pt reports pain to left shoulder radiating across upper chest; pt is chemo pt for bladder CA stage 4; st pain increases with deep breathing; st pain similar to prev MI

## 2019-03-03 NOTE — ED Notes (Signed)
Per chart note 12/19/17 pt has 8 Fr Power port in place

## 2019-03-03 NOTE — ED Notes (Signed)
Report given to Vanessa, RN.

## 2019-03-04 ENCOUNTER — Other Ambulatory Visit: Payer: Self-pay

## 2019-03-04 ENCOUNTER — Inpatient Hospital Stay: Payer: Medicare Other

## 2019-03-04 ENCOUNTER — Other Ambulatory Visit: Payer: Medicare Other

## 2019-03-04 ENCOUNTER — Ambulatory Visit: Payer: Medicare Other

## 2019-03-04 ENCOUNTER — Ambulatory Visit: Payer: Medicare Other | Admitting: Oncology

## 2019-03-04 DIAGNOSIS — Z801 Family history of malignant neoplasm of trachea, bronchus and lung: Secondary | ICD-10-CM | POA: Diagnosis not present

## 2019-03-04 DIAGNOSIS — C652 Malignant neoplasm of left renal pelvis: Secondary | ICD-10-CM | POA: Diagnosis not present

## 2019-03-04 DIAGNOSIS — I10 Essential (primary) hypertension: Secondary | ICD-10-CM | POA: Diagnosis not present

## 2019-03-04 DIAGNOSIS — I25119 Atherosclerotic heart disease of native coronary artery with unspecified angina pectoris: Secondary | ICD-10-CM | POA: Diagnosis not present

## 2019-03-04 DIAGNOSIS — G620 Drug-induced polyneuropathy: Secondary | ICD-10-CM | POA: Diagnosis not present

## 2019-03-04 DIAGNOSIS — Z87891 Personal history of nicotine dependence: Secondary | ICD-10-CM

## 2019-03-04 DIAGNOSIS — Z800 Family history of malignant neoplasm of digestive organs: Secondary | ICD-10-CM

## 2019-03-04 DIAGNOSIS — I252 Old myocardial infarction: Secondary | ICD-10-CM

## 2019-03-04 DIAGNOSIS — R0789 Other chest pain: Secondary | ICD-10-CM | POA: Diagnosis not present

## 2019-03-04 DIAGNOSIS — E119 Type 2 diabetes mellitus without complications: Secondary | ICD-10-CM | POA: Diagnosis not present

## 2019-03-04 DIAGNOSIS — C772 Secondary and unspecified malignant neoplasm of intra-abdominal lymph nodes: Secondary | ICD-10-CM | POA: Diagnosis not present

## 2019-03-04 DIAGNOSIS — R071 Chest pain on breathing: Secondary | ICD-10-CM | POA: Diagnosis not present

## 2019-03-04 DIAGNOSIS — C679 Malignant neoplasm of bladder, unspecified: Secondary | ICD-10-CM | POA: Diagnosis not present

## 2019-03-04 DIAGNOSIS — R079 Chest pain, unspecified: Secondary | ICD-10-CM | POA: Diagnosis present

## 2019-03-04 LAB — GLUCOSE, CAPILLARY
Glucose-Capillary: 148 mg/dL — ABNORMAL HIGH (ref 70–99)
Glucose-Capillary: 143 mg/dL — ABNORMAL HIGH (ref 70–99)
Glucose-Capillary: 254 mg/dL — ABNORMAL HIGH (ref 70–99)
Glucose-Capillary: 235 mg/dL — ABNORMAL HIGH (ref 70–99)
Glucose-Capillary: 424 mg/dL — ABNORMAL HIGH (ref 70–99)

## 2019-03-04 LAB — TSH: TSH: 1.977 u[IU]/mL (ref 0.350–4.500)

## 2019-03-04 LAB — TROPONIN I (HIGH SENSITIVITY)
Troponin I (High Sensitivity): 6 ng/L (ref <18)
Troponin I (High Sensitivity): 6 ng/L (ref <18)
Troponin I (High Sensitivity): 9 ng/L (ref <18)

## 2019-03-04 LAB — PROCALCITONIN: Procalcitonin: 0.89 ng/mL

## 2019-03-04 LAB — SARS CORONAVIRUS 2 (TAT 6-24 HRS): SARS Coronavirus 2: NEGATIVE

## 2019-03-04 MED ORDER — ENOXAPARIN SODIUM 40 MG/0.4ML ~~LOC~~ SOLN
40.0000 mg | SUBCUTANEOUS | Status: DC
  Administered 2019-03-05: 40 mg via SUBCUTANEOUS
  Filled 2019-03-04: qty 0.4

## 2019-03-04 MED ORDER — MAGNESIUM HYDROXIDE 400 MG/5ML PO SUSP: 15.0000 mL | Freq: Every day | ORAL | Status: DC | PRN

## 2019-03-04 MED ORDER — NITROGLYCERIN 0.4 MG SL SUBL: 0.4000 mg | SUBLINGUAL_TABLET | SUBLINGUAL | Status: DC | PRN

## 2019-03-04 MED ORDER — NYSTATIN 100000 UNIT/GM EX CREA
TOPICAL_CREAM | Freq: Two times a day (BID) | CUTANEOUS | Status: DC | PRN
  Filled 2019-03-04: qty 15

## 2019-03-04 MED ORDER — INSULIN ASPART 100 UNIT/ML ~~LOC~~ SOLN
0.0000 [IU] | Freq: Three times a day (TID) | SUBCUTANEOUS | Status: DC
  Administered 2019-03-04: 1 [IU] via SUBCUTANEOUS
  Administered 2019-03-04: 12:00:00 5 [IU] via SUBCUTANEOUS
  Administered 2019-03-04: 17:00:00 3 [IU] via SUBCUTANEOUS
  Filled 2019-03-04 (×3): qty 1

## 2019-03-04 MED ORDER — DOCUSATE SODIUM 100 MG PO CAPS
100.0000 mg | ORAL_CAPSULE | Freq: Two times a day (BID) | ORAL | Status: DC
  Administered 2019-03-04 – 2019-03-05 (×3): 100 mg via ORAL
  Filled 2019-03-04 (×3): qty 1

## 2019-03-04 MED ORDER — FLUTICASONE PROPIONATE 50 MCG/ACT NA SUSP
2.0000 | Freq: Every day | NASAL | Status: DC | PRN
  Filled 2019-03-04: qty 16

## 2019-03-04 MED ORDER — METHYLPREDNISOLONE SODIUM SUCC 125 MG IJ SOLR
60.0000 mg | Freq: Two times a day (BID) | INTRAMUSCULAR | Status: DC
  Administered 2019-03-04 – 2019-03-05 (×3): 60 mg via INTRAVENOUS
  Filled 2019-03-04 (×3): qty 2

## 2019-03-04 MED ORDER — VITAMIN B-12 1000 MCG PO TABS
1000.0000 ug | ORAL_TABLET | Freq: Every day | ORAL | Status: DC
  Administered 2019-03-04 – 2019-03-05 (×2): 1000 ug via ORAL
  Filled 2019-03-04 (×2): qty 1

## 2019-03-04 MED ORDER — CLOBETASOL PROPIONATE 0.05 % EX CREA
1.0000 "application " | TOPICAL_CREAM | CUTANEOUS | Status: DC | PRN
  Filled 2019-03-04: qty 15

## 2019-03-04 MED ORDER — GABAPENTIN 300 MG PO CAPS
300.0000 mg | ORAL_CAPSULE | Freq: Three times a day (TID) | ORAL | Status: DC
  Administered 2019-03-04: 300 mg via ORAL
  Filled 2019-03-04 (×3): qty 1

## 2019-03-04 MED ORDER — ACETAMINOPHEN 325 MG PO TABS: 650.0000 mg | ORAL_TABLET | Freq: Four times a day (QID) | ORAL | Status: DC | PRN

## 2019-03-04 MED ORDER — ONDANSETRON HCL 4 MG PO TABS: 4.0000 mg | ORAL_TABLET | Freq: Four times a day (QID) | ORAL | Status: DC | PRN

## 2019-03-04 MED ORDER — SERTRALINE HCL 50 MG PO TABS
100.0000 mg | ORAL_TABLET | Freq: Every day | ORAL | Status: DC
  Administered 2019-03-04 – 2019-03-05 (×2): 100 mg via ORAL
  Filled 2019-03-04 (×2): qty 2

## 2019-03-04 MED ORDER — NYSTATIN 100000 UNIT/GM EX CREA
TOPICAL_CREAM | Freq: Two times a day (BID) | CUTANEOUS | Status: DC
  Filled 2019-03-04: qty 15

## 2019-03-04 MED ORDER — KETOROLAC TROMETHAMINE 15 MG/ML IJ SOLN
15.0000 mg | Freq: Four times a day (QID) | INTRAMUSCULAR | Status: DC
  Administered 2019-03-04 – 2019-03-05 (×4): 15 mg via INTRAVENOUS
  Filled 2019-03-04 (×4): qty 1

## 2019-03-04 MED ORDER — NITROGLYCERIN 2 % TD OINT
0.5000 [in_us] | TOPICAL_OINTMENT | Freq: Once | TRANSDERMAL | Status: AC
  Administered 2019-03-04: 0.5 [in_us] via TOPICAL
  Filled 2019-03-04: qty 1

## 2019-03-04 MED ORDER — INSULIN ASPART 100 UNIT/ML ~~LOC~~ SOLN
15.0000 [IU] | Freq: Once | SUBCUTANEOUS | Status: AC
  Administered 2019-03-04: 15 [IU] via SUBCUTANEOUS

## 2019-03-04 MED ORDER — HYDROXYZINE HCL 25 MG PO TABS
25.0000 mg | ORAL_TABLET | Freq: Three times a day (TID) | ORAL | Status: DC | PRN
  Filled 2019-03-04: qty 1

## 2019-03-04 MED ORDER — LORATADINE 10 MG PO TABS: 10.0000 mg | ORAL_TABLET | Freq: Every day | ORAL | Status: DC | PRN

## 2019-03-04 MED ORDER — INSULIN ASPART 100 UNIT/ML ~~LOC~~ SOLN
0.0000 [IU] | Freq: Three times a day (TID) | SUBCUTANEOUS | Status: DC
  Administered 2019-03-05: 08:00:00 4 [IU] via SUBCUTANEOUS
  Filled 2019-03-04: qty 1

## 2019-03-04 MED ORDER — ATORVASTATIN CALCIUM 20 MG PO TABS
40.0000 mg | ORAL_TABLET | Freq: Every day | ORAL | Status: DC
  Administered 2019-03-04: 22:00:00 40 mg via ORAL
  Filled 2019-03-04: qty 2

## 2019-03-04 MED ORDER — INSULIN ASPART 100 UNIT/ML ~~LOC~~ SOLN
0.0000 [IU] | Freq: Every day | SUBCUTANEOUS | Status: DC
  Administered 2019-03-04: 22:00:00 5 [IU] via SUBCUTANEOUS
  Filled 2019-03-04: qty 1

## 2019-03-04 MED ORDER — VITAMIN C 500 MG PO TABS: 1000.0000 mg | ORAL_TABLET | Freq: Every day | ORAL | Status: DC | PRN

## 2019-03-04 MED ORDER — LOSARTAN POTASSIUM 50 MG PO TABS: 100.0000 mg | ORAL_TABLET | Freq: Every day | ORAL | Status: DC

## 2019-03-04 MED ORDER — CHLORHEXIDINE GLUCONATE CLOTH 2 % EX PADS
6.0000 | MEDICATED_PAD | Freq: Every day | CUTANEOUS | Status: DC
  Administered 2019-03-04: 6 via TOPICAL

## 2019-03-04 MED ORDER — ACETAMINOPHEN 650 MG RE SUPP: 650.0000 mg | Freq: Four times a day (QID) | RECTAL | Status: DC | PRN

## 2019-03-04 MED ORDER — ASPIRIN EC 81 MG PO TBEC
81.0000 mg | DELAYED_RELEASE_TABLET | Freq: Every evening | ORAL | Status: DC
  Administered 2019-03-04: 81 mg via ORAL
  Filled 2019-03-04: qty 1

## 2019-03-04 MED ORDER — CARVEDILOL 12.5 MG PO TABS
12.5000 mg | ORAL_TABLET | Freq: Two times a day (BID) | ORAL | Status: DC
  Administered 2019-03-04 – 2019-03-05 (×3): 12.5 mg via ORAL
  Filled 2019-03-04 (×3): qty 1

## 2019-03-04 MED ORDER — ASPIRIN 81 MG PO CHEW
324.0000 mg | CHEWABLE_TABLET | Freq: Once | ORAL | Status: AC
  Administered 2019-03-04: 01:00:00 324 mg via ORAL
  Filled 2019-03-04: qty 4

## 2019-03-04 MED ORDER — LOSARTAN POTASSIUM 50 MG PO TABS
100.0000 mg | ORAL_TABLET | Freq: Every day | ORAL | Status: DC
  Administered 2019-03-04: 21:00:00 100 mg via ORAL
  Filled 2019-03-04: qty 2

## 2019-03-04 MED ORDER — ONDANSETRON HCL 4 MG/2ML IJ SOLN: 4.0000 mg | Freq: Four times a day (QID) | INTRAMUSCULAR | Status: DC | PRN

## 2019-03-04 NOTE — Consult Note (Signed)
[facility] Cardiology  CARDIOLOGY CONSULT NOTE  Patient ID: LAURY TENNYSON MRN: [id number] DOB/AGE: Aug 01, 1945 73 y.o.  Admit date: 03/03/2019 Referring Physician Posey Pronto Primary Physician  Primary Cardiologist Thornell Mule Reason for Consultation chest pain  HPI: 73 year old gentleman referred for evaluation of chest pain.  Patient was in his usual state of health until yesterday afternoon when he developed sided, sharp pain which radiated to his left shoulder and across his chest.  Chest pain was much worse with deep inspiration and cough with a positional component.  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  High-sensitivity troponin were 6, 6, and 6.  Reports feeling better this morning, with less chest discomfort, which is more localized, without radiation.  Patient has known history of age for metastatic bladder cancer.  He has known coronary artery disease, status post inferior STEMI 12/95, stent RCA, stent LAD 2/99, stent RCA 09/14/1998, and stent mid LAD 10/22/2012.  Review of systems complete and found to be negative unless listed above     Past Medical History:  Diagnosis Date  . Acid reflux   . Anxiety   . Depression   . Diabetes mellitus without complication ([place])   . Hyperlipidemia   . Hypertension   . MRSA (methicillin resistant Staphylococcus aureus) infection 1992   history  . Myocardial infarction ([place]) 1995  . Sleep apnea    CPAP  . 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 CATARACT EXTRACTION Left   . COLONOSCOPY  2010   [facility]  . COLONOSCOPY WITH PROPOFOL N/A 10/04/2016   Procedure: COLONOSCOPY WITH PROPOFOL;  Surgeon: [name], MD;  Location: [facility] ENDOSCOPY;  Service: Endoscopy;  Laterality: N/A;  . [place], 2000, 2001, 2014  . CYSTOSCOPY W/ RETROGRADES Bilateral 12/10/2017    Procedure: CYSTOSCOPY WITH RETROGRADE PYELOGRAM;  Surgeon: [name], MD;  Location: [facility] ORS;  Service: Urology;  Laterality: Bilateral;  . CYSTOSCOPY W/ RETROGRADES Left 12/09/2018   Procedure: CYSTOSCOPY WITH RETROGRADE PYELOGRAM;  Surgeon: [name], MD;  Location: [facility] ORS;  Service: Urology;  Laterality: Left;  . CYSTOSCOPY WITH BIOPSY Left 12/09/2018   Procedure: CYSTOSCOPY WITH URETERAL/RENAL PELVIC BIOPSY;  Surgeon: [name], MD;  Location: [facility] ORS;  Service: Urology;  Laterality: Left;  . CYSTOSCOPY WITH STENT PLACEMENT Left 12/10/2017   Procedure: CYSTOSCOPY WITH STENT PLACEMENT;  Surgeon: [name], MD;  Location: [facility] ORS;  Service: Urology;  Laterality: Left;  . CYSTOSCOPY WITH STENT PLACEMENT Left 12/09/2018   Procedure: CYSTOSCOPY WITH STENT PLACEMENT;  Surgeon: [name], MD;  Location: [facility] ORS;  Service: Urology;  Laterality: Left;  . CYSTOSCOPY WITH URETEROSCOPY Left 12/09/2018   Procedure: CYSTOSCOPY WITH URETEROSCOPY;  Surgeon: [name], MD;  Location: [facility] ORS;  Service: Urology;  Laterality: Left;  . EYE SURGERY Bilateral    cataract  . INGUINAL HERNIA REPAIR Right 08/09/2015   Procedure: HERNIA REPAIR INGUINAL ADULT;  Surgeon: [name], MD;  Location: [facility] ORS;  Service: General;  Laterality: Right;  . NASAL SINUS SURGERY    . nuclear stress test    . PORTA CATH INSERTION N/A 12/19/2017   Procedure: PORTA CATH INSERTION;  Surgeon: [name], MD;  Location: [facility] CV LAB;  Service: Cardiovascular;  Laterality: N/A;  . URETERAL BIOPSY Left 12/10/2017   Procedure: URETERAL & renal PELVIS BIOPSY;  Surgeon: [name], MD;  Location: [facility] ORS;  Service: Urology;  Laterality: Left;  . URETEROSCOPY Left 12/10/2017   Procedure: URETEROSCOPY;  Surgeon: [name], MD;  Location: [facility] ORS;  Service: Urology;  Laterality: Left;    Medications Prior to Admission  Medication Sig Dispense Refill Last Dose  . aspirin EC 81 MG  tablet Take 81 mg by mouth every evening.    03/04/2019 at 2000  . atorvastatin (LIPITOR) 40 MG tablet Take 1 tablet (40 mg total) by mouth at bedtime. 90 tablet 3 03/04/2019 at Unknown time  . carvedilol (COREG) 12.5 MG tablet TAKE 1 TABLET BY MOUTH TWICE DAILY (Patient taking differently: Take 12.5 mg by mouth 2 (two) times a day. ) 180 tablet 3 03/04/2019 at 2000  . clobetasol cream (TEMOVATE) [id number] % Apply 1 application topically as needed (for skin rash). 30 g 1 prn at prn  . empagliflozin (JARDIANCE) 10 MG TABS tablet Take 10 mg by mouth daily. (Patient taking differently: Take 10 mg by mouth daily as needed (elevated blood sugar). ) 90 tablet 3 prn at prn  . fluticasone (FLONASE) 50 MCG/ACT nasal spray Use 2 spray(s) in each nostril once daily (Patient taking differently: Place 2 sprays into both nostrils daily as needed for allergies. ) 48 g 3 prn at prn  . hydrOXYzine (ATARAX/VISTARIL) 25 MG tablet Take 1 tablet (25 mg total) by mouth every 8 (eight) hours as needed for itching. 30 tablet 3 prn at prn  . Ivermectin (SOOLANTRA) 1 % CREA Apply 1 application topically daily as needed (rosacea). To face     . lidocaine-prilocaine (EMLA) cream Apply 1 application topically as needed (port access). 1 g 3   . loratadine (CLARITIN) 10 MG tablet Take 10 mg by mouth daily as needed for allergies.   prn at prn  . losartan (COZAAR) 100 MG tablet TAKE 1 TABLET BY MOUTH ONCE DAILY (Patient taking differently: Take 100 mg by mouth at bedtime. ) 90 tablet 3 03/04/2019 at Unknown time  . magnesium hydroxide (MILK OF MAGNESIA) 400 MG/5ML suspension Take 15 mLs by mouth daily as needed for mild constipation.   prn at prn  . metFORMIN (GLUCOPHAGE) 1000 MG tablet TAKE 1 TABLET BY MOUTH TWICE DAILY WITH MEALS (Patient taking differently: Take 1,000 mg by mouth 2 (two) times a day. ) 180 tablet 3 03/04/2019 at Unknown time  . nystatin cream (MYCOSTATIN) Apply topically 2 (two) times daily. (Patient taking differently:  Apply 1 application topically 2 (two) times daily as needed (rash). ) 15 g 1 prn at prn  . OVER THE COUNTER MEDICATION Apply 1 application topically daily as needed (pain). [place]back topical pain relief   prn at prn  . sertraline (ZOLOFT) 100 MG tablet Take 1 tablet (100 mg total) by mouth daily. 90 tablet 3 03/04/2019 at Unknown time  . vitamin B-12 (CYANOCOBALAMIN) 1000 MCG tablet Take 1,000 mcg by mouth daily.   03/04/2019 at Unknown time  . vitamin C (ASCORBIC ACID) 500 MG tablet Take 1,000-1,500 mg by mouth daily as needed (immune support).   prn at prn  . gabapentin (NEURONTIN) 300 MG capsule Take 1 capsule (300 mg total) by mouth 3 (three) times daily. 90 capsule 0   . glucose blood (CONTOUR NEXT TEST) test strip Check blood sugars 3 times daily. 300 each 11    Social History   Socioeconomic History  . Marital status: Married    Spouse name: [name]  . Number of children: 1  .  Years of education: Not on file  . Highest education level: Associate degree: occupational, Hotel manager, or vocational program  Occupational History  . Occupation: retired  Scientific laboratory technician  . Financial resource strain: Not hard at all  . Food insecurity    Worry: Never true    Inability: Never true  . Transportation needs    Medical: No    Non-medical: No  Tobacco Use  . Smoking status: Former Smoker    Types: Cigars    Quit date: 10/09/1978    Years since quitting: 40.4  . Smokeless tobacco: Former Systems developer    Types: Chew    Quit date: 12/04/1988  . Tobacco comment: on occasion  Substance and Sexual Activity  . Alcohol use: Not Currently  . Drug use: No  . Sexual activity: Not Currently  Lifestyle  . Physical activity    Days per week: 0 days    Minutes per session: 0 min  . Stress: Only a little  Relationships  . Social connections    Talks on phone: More than three times a week    Gets together: More than three times a week    Attends religious service: More than 4 times per year    Active member of  club or organization: Not on file    Attends meetings of clubs or organizations: Not on file    Relationship status: Married  . Intimate partner violence    Fear of current or ex partner: No    Emotionally abused: No    Physically abused: No    Forced sexual activity: No  Other Topics Concern  . Not on file  Social History Narrative  . Not on file    Family History  Problem Relation Age of Onset  . Heart disease Mother   . Cancer Father        Lung and colon cancer  . Heart disease Father   . Emphysema Maternal Grandfather   . Tuberculosis Maternal Grandmother       Review of systems complete and found to be negative unless listed above      PHYSICAL EXAM  General: Well developed, well nourished, in no acute distress HEENT:  Normocephalic and atramatic Neck:  No JVD.  Lungs: Clear bilaterally to auscultation and percussion. Heart: HRRR . Normal S1 and S2 without gallops or murmurs.  Abdomen: Bowel sounds are positive, abdomen soft and non-tender  Msk:  Back normal, normal gait. Normal strength and tone for age. Extremities: No clubbing, cyanosis or edema.   Neuro: Alert and oriented X 3. Psych:  Good affect, responds appropriately  Labs:   Lab Results  Component Value Date   WBC 9.9 03/03/2019   HGB 11.5 (L) 03/03/2019   HCT 35.1 (L) 03/03/2019   MCV 88.4 03/03/2019   PLT 206 03/03/2019    Recent Labs  Lab 03/03/19 2058  NA 135  K 4.2  CL 101  CO2 24  BUN 15  CREATININE 0.52*  CALCIUM 9.3  PROT 7.0  BILITOT 0.9  ALKPHOS 70  ALT 20  AST 22  GLUCOSE 172*   Lab Results  Component Value Date   TROPONINI <0.03 12/17/2017    Lab Results  Component Value Date   CHOL 119 03/15/2017   CHOL 130 03/07/2016   CHOL 150 03/31/2015   Lab Results  Component Value Date   HDL 45 03/15/2017   HDL 51 03/07/2016   HDL 43 03/31/2015   Lab Results  Component Value Date  [place] 62 03/15/2017   LDLCALC 64 03/07/2016   LDLCALC 88 03/31/2015   Lab  Results  Component Value Date   TRIG 60 03/15/2017   TRIG 77 03/07/2016   TRIG 93 03/31/2015   No results found for: CHOLHDL No results found for: LDLDIRECT    Radiology: Ct Angio Chest Pe W And/or Wo Contrast  Result Date: 03/03/2019 CLINICAL DATA:  Chest pain history of bladder cancer EXAM: CT ANGIOGRAPHY CHEST WITH CONTRAST TECHNIQUE: Multidetector CT imaging of the chest was performed using the standard protocol during bolus administration of intravenous contrast. Multiplanar CT image reconstructions and MIPs were obtained to evaluate the vascular anatomy. CONTRAST:  5mL OMNIPAQUE IOHEXOL 350 MG/ML SOLN COMPARISON:  Chest x-ray 03/03/2019, CT chest 11/25/2018 FINDINGS: Cardiovascular: Satisfactory opacification of the pulmonary arteries to the segmental level. No evidence of pulmonary embolism. Nonaneurysmal aorta. No dissection is seen. Mild aortic atherosclerosis. Coronary vascular calcification. Borderline cardiomegaly. Right-sided central venous port tip in the proximal right atrium. No pericardial effusion Mediastinum/Nodes: Midline trachea. No thyroid mass. Stable 12 mm right precarinal lymph node. Small hiatal hernia. Lungs/Pleura: No consolidation, pleural effusion or pneumothorax. Bilateral septal thickening with mild hazy density at the lower lobes. Upper Abdomen: No acute abnormality Musculoskeletal: No acute or suspicious osseous abnormality Review of the MIP images confirms the above findings. IMPRESSION: 1. Negative for acute pulmonary embolus or aortic dissection 2. Bilateral interstitial thickening, suspect for interstitial lung disease. Scattered hazy bilateral lower lobe densities, probable atelectasis. Aortic Atherosclerosis (ICD10-I70.0). Electronically Signed   By: Donavan Foil M.D.   On: 03/03/2019 22:22   Dg Chest Portable 1 View  Result Date: 03/03/2019 CLINICAL DATA:  Chest pain, stage IV bladder cancer EXAM: PORTABLE CHEST 1 VIEW COMPARISON:  CT chest Nov 24, 2020  FINDINGS: Twenty right IJ approach Port-A-Cath tip terminates in the right atrium. Multiple cardiac monitoring leads overlie the chest. Coarse interstitial opacities are again seen throughout the lungs. No consolidation, features of edema, pneumothorax, or effusion. Pulmonary vascularity is normally distributed. The aorta is calcified and tortuous. The remaining cardiomediastinal contours are unremarkable. No acute osseous or soft tissue abnormality. Degenerative changes are present in the imaged spine and shoulders. IMPRESSION: 1. Right IJ approach Port-A-Cath tip terminates in the right atrium. 2. Coarse interstitial opacities throughout the lungs, unchanged. 3. No acute cardiopulmonary abnormality. Electronically Signed   By: Lovena Le M.D.   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  Atypical, pleuritic and positional chest pain, which has improved, with normal high-sensitivity troponin 2.  Known CAD, status post multiple stents 3.  Stage IV bladder cancer  Recommendations  1.  Agree with overall current therapy 2.  Defer full dose anticoagulation 3.  Defer cardiac catheterization 4.  Review 2D echocardiogram 5.  Increase ambulation today, if patient continues to improve clinically may consider discharge home with outpatient cardiology follow-up  Signed: Isaias Cowman MD,PhD, [facility] 03/04/2019, 8:04 AM

## 2019-03-04 NOTE — Progress Notes (Signed)
Advanced care plan.  Purpose of the Encounter: CODE STATUS  Parties in Attendance: Patient himself  Patient's Decision Capacity: Intact  Subjective/Patient's story: Patient is a 73 year old history of GERD anxiety depression diabetes type 2, hyperlipidemia hypertension, coronary artery disease and bladder cancer presenting with chest pain   Objective/Medical story  I discussed with the patient his desires for cardiac and pulmonary resuscitation  Goals of care determination:  Patient states he would like everything to be done wants to be a full code   CODE STATUS:  Full code  Time spent discussing advanced care planning: 16 minutes

## 2019-03-04 NOTE — H&P (Signed)
Patrick Mcgee is an 73 y.o. male.   Chief Complaint: Chest pain HPI: The patient with past medical history of bladder cancer, CAD, hypertension, diabetes and hyperlipidemia presents to the emergency department complaining of chest pain.  The pain began as the patient was walking on a store shopping.  He denies nausea, vomiting or diaphoresis.  In the emergency department the patient continued to have chest pain following application of Nitropaste.  EKG showed no signs of ischemia and initial troponin was negative.  However, due to ongoing chest pain the emergency department staff asked the hospitalist service for further evaluation.  Past Medical History:  Diagnosis Date  . Acid reflux   . Anxiety   . Depression   . Diabetes mellitus without complication ([place])   . Hyperlipidemia   . Hypertension   . MRSA (methicillin resistant Staphylococcus aureus) infection 1992   history  . Myocardial infarction ([place]) 1995  . Sleep apnea    CPAP  . 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 CATARACT EXTRACTION Left   . COLONOSCOPY  2010   [facility]  . COLONOSCOPY WITH PROPOFOL N/A 10/04/2016   Procedure: COLONOSCOPY WITH PROPOFOL;  Surgeon: [name], MD;  Location: [facility] ENDOSCOPY;  Service: Endoscopy;  Laterality: N/A;  . [place], 2000, 2001, 2014  . CYSTOSCOPY W/ RETROGRADES Bilateral 12/10/2017   Procedure: CYSTOSCOPY WITH RETROGRADE PYELOGRAM;  Surgeon: [name], MD;  Location: [facility] ORS;  Service: Urology;  Laterality: Bilateral;  . CYSTOSCOPY W/ RETROGRADES Left 12/09/2018   Procedure: CYSTOSCOPY WITH RETROGRADE PYELOGRAM;  Surgeon: [name], MD;  Location: [facility] ORS;  Service: Urology;  Laterality: Left;  . CYSTOSCOPY WITH BIOPSY Left 12/09/2018   Procedure: CYSTOSCOPY WITH URETERAL/RENAL PELVIC BIOPSY;  Surgeon: [name], MD;  Location: [facility] ORS;   Service: Urology;  Laterality: Left;  . CYSTOSCOPY WITH STENT PLACEMENT Left 12/10/2017   Procedure: CYSTOSCOPY WITH STENT PLACEMENT;  Surgeon: [name], MD;  Location: [facility] OR[facility];  Service: Urology;  Laterality: Left;  . CYSTOSCOPY WITH STENT PLACEMENT Left 12/09/2018   Procedure: CYSTOSCOPY WITH STENT PLACEMENT;  Surgeon: [name], MD;  Location: [facility] OR[facility];  Service: Urology;  Laterality: Left;  . CYSTOSCOPY WITH URETEROSCOPY Left 12/09/2018   Procedure: CYSTOSCOPY WITH URETEROSCOPY;  Surgeon: [name], MD;  Location: [facility] OR[facility];  Service: Urology;  Laterality: Left;  . EYE SURGERY Bilateral    cataract  . INGUINAL HERNIA REPAIR Right 08/09/2015   Procedure: HERNIA REPAIR INGUINAL ADULT;  Surgeon: [name], MD;  Location: [facility] ORS;  Service: General;  Laterality: Right;  . NASAL SINUS SURGERY    . nuclear stress test    . PORTA CATH INSERTION N/A 12/19/2017   Procedure: PORTA CATH INSERTION;  Surgeon: [name], MD;  Location: [facility];  Service: Cardiovascular;  Laterality: N/A;  . URETERAL BIOPSY Left 12/10/2017   Procedure: URETERAL & renal PELVIS BIOPSY;  Surgeon: [name], MD;  Location: [facility] OR[facility];  Service: Urology;  Laterality: Left;  . URETEROSCOPY Left 12/10/2017   Procedure: URETEROSCOPY;  Surgeon: [name], MD;  Location: [facility] OR[facility];  Service: Urology;  Laterality: Left;    Family History  Problem Relation Age of Onset  . Heart disease Mother   . Cancer Father        Lung and colon cancer  . Heart disease Father   . Emphysema  Maternal Grandfather   . Tuberculosis Maternal Grandmother    Social History:  reports that he quit smoking about 40 years ago. His smoking use included cigars. He quit smokeless tobacco use about 30 years ago.  His smokeless tobacco use included chew. He reports previous alcohol use. He reports that he does not use drugs.  Allergies:  Allergies  Allergen Reactions  . Sulfa Antibiotics Other (See Comments)     Joint pain  . Ace Inhibitors Cough  . Invokana [Canagliflozin] Other (See Comments)    Leg pain  . Penicillins Rash    Did it involve swelling of the face/tongue/throat, SOB, or low BP? No Did it involve sudden or severe rash/hives, skin peeling, or any reaction on the inside of your mouth or nose? Yes Did you need to seek medical attention at a hospital or doctor's office? Yes When did it last happen?childhood allergy If all above answers are "NO", may proceed with cephalosporin use.     Prior to Admission medications   Medication Sig Start Date End Date Taking? Authorizing Provider  aspirin EC 81 MG tablet Take 81 mg by mouth every evening.    Yes [provider]  atorvastatin (LIPITOR) 40 MG tablet Take 1 tablet (40 mg total) by mouth at bedtime. 03/24/18  Yes Jerrol Banana., MD  carvedilol (COREG) 12.5 MG tablet TAKE 1 TABLET BY MOUTH TWICE DAILY Patient taking differently: Take 12.5 mg by mouth 2 (two) times a day.  06/24/18  Yes Jerrol Banana., MD  clobetasol cream (TEMOVATE) [id number] % Apply 1 application topically as needed (for skin rash). 01/01/18  Yes Jerrol Banana., MD  empagliflozin (JARDIANCE) 10 MG TABS tablet Take 10 mg by mouth daily. Patient taking differently: Take 10 mg by mouth daily as needed (elevated blood sugar).  01/29/19  Yes Jerrol Banana., MD  fluticasone [facility]) 50 MCG/ACT nasal spray Use 2 spray(s) in each nostril once daily Patient taking differently: Place 2 sprays into both nostrils daily as needed for allergies.  09/25/18  Yes Jerrol Banana., MD  hydrOXYzine (ATARAX/VISTARIL) 25 MG tablet Take 1 tablet (25 mg total) by mouth every 8 (eight) hours as needed for itching. 06/05/18  Yes Jerrol Banana., MD  Ivermectin (SOOLANTRA) 1 % CREA Apply 1 application topically daily as needed (rosacea). To face   Yes [provider]  lidocaine-prilocaine (EMLA) cream Apply 1 application topically as  needed (port access). 12/24/18  Yes Sindy Guadeloupe, MD  loratadine (CLARITIN) 10 MG tablet Take 10 mg by mouth daily as needed for allergies.   Yes [provider]  losartan (COZAAR) 100 MG tablet TAKE 1 TABLET BY MOUTH ONCE DAILY Patient taking differently: Take 100 mg by mouth at bedtime.  06/10/18  Yes Jerrol Banana., MD  magnesium hydroxide (MILK OF MAGNESIA) 400 MG/5ML suspension Take 15 mLs by mouth daily as needed for mild constipation.   Yes [provider]  metFORMIN (GLUCOPHAGE) 1000 MG tablet TAKE 1 TABLET BY MOUTH TWICE DAILY WITH MEALS Patient taking differently: Take 1,000 mg by mouth 2 (two) times a day.  06/10/18  Yes Jerrol Banana., MD  nystatin cream (MYCOSTATIN) Apply topically 2 (two) times daily. Patient taking differently: Apply 1 application topically 2 (two) times daily as needed (rash).  04/24/18  Yes Jerrol Banana., MD  OVER THE COUNTER MEDICATION Apply 1 application topically daily as needed (pain). Outback topical pain relief  Yes [provider]  sertraline (ZOLOFT) 100 MG tablet Take 1 tablet (100 mg total) by mouth daily. 12/18/18  Yes Jerrol Banana., MD  vitamin B-12 (CYANOCOBALAMIN) 1000 MCG tablet Take 1,000 mcg by mouth daily.   Yes [provider]  vitamin C (ASCORBIC ACID) 500 MG tablet Take 1,000-1,500 mg by mouth daily as needed (immune support).   Yes [provider]  gabapentin (NEURONTIN) 300 MG capsule Take 1 capsule (300 mg total) by mouth 3 (three) times daily. 02/12/19 03/14/19  Sindy Guadeloupe, MD  glucose blood (CONTOUR NEXT TEST) test strip Check blood sugars 3 times daily. 11/26/18   Jerrol Banana., MD     Results for orders placed or performed during the hospital encounter of 03/03/19 (from the past 48 hour(s))  CBC with Differential     Status: Abnormal   Collection Time: 03/03/19  8:58 PM  Result Value Ref Range   WBC 9.9 4.0 - 10.5 K/uL   RBC 3.97 (L) 4.22 - 5.81  MIL/uL   Hemoglobin 11.5 (L) 13.0 - 17.0 g/dL   HCT 35.1 (L) 39.0 - 52.0 %   MCV 88.4 80.0 - 100.0 fL   MCH 29.0 26.0 - 34.0 pg   MCHC 32.8 30.0 - 36.0 g/dL   RDW 15.7 (H) 11.5 - 15.5 %   Platelets 206 150 - 400 K/uL   nRBC 0.0 0.0 - 0.2 %   Neutrophils Relative % 74 %   Neutro Abs 7.3 1.7 - 7.7 K/uL   Lymphocytes Relative 14 %   Lymphs Abs 1.4 0.7 - 4.0 K/uL   Monocytes Relative 9 %   Monocytes Absolute 0.9 0.1 - 1.0 K/uL   Eosinophils Relative 3 %   Eosinophils Absolute 0.3 0.0 - 0.5 K/uL   Basophils Relative 0 %   Basophils Absolute 0.0 0.0 - 0.1 K/uL   Immature Granulocytes 0 %   Abs Immature Granulocytes 0.02 0.00 - 0.07 K/uL    Comment: Performed at [facility], [address] [place], [place] [address]  Comprehensive metabolic panel     Status: Abnormal   Collection Time: 03/03/19  8:58 PM  Result Value Ref Range   Sodium 135 135 - 145 mmol/L   Potassium 4.2 3.5 - 5.1 mmol/L   Chloride 101 98 - 111 mmol/L   CO2 24 22 - 32 mmol/L   Glucose, Bld 172 (H) 70 - 99 mg/dL   BUN 15 8 - 23 mg/dL   Creatinine, Ser 0.52 (L) 0.61 - 1.24 mg/dL   Calcium 9.3 8.9 - 10.3 mg/dL   Total Protein 7.0 6.5 - 8.1 g/dL   Albumin 3.8 3.5 - 5.0 g/dL   AST 22 15 - 41 U/L   ALT 20 0 - 44 U/L   Alkaline Phosphatase 70 38 - 126 U/L   Total Bilirubin 0.9 0.3 - 1.2 mg/dL   GFR calc non Af Amer >60 >60 mL/min   GFR calc Af Amer >60 >60 mL/min   Anion gap 10 5 - 15    Comment: Performed at [facility], [place], [place] [address]  Troponin I (High Sensitivity)     Status: None   Collection Time: 03/03/19  8:58 PM  Result Value Ref Range   Troponin I (High Sensitivity) 6 <18 ng/L    Comment: (NOTE) Elevated high sensitivity troponin I (hsTnI) values and significant  changes across serial measurements may suggest ACS but many other  chronic and  acute conditions are known to elevate hsTnI results.  Refer to the "Links" section for chest pain algorithms and  additional  guidance. Performed at [facility], [place], [place] [address]   Troponin I (High Sensitivity)     Status: None   Collection Time: 03/03/19 11:48 PM  Result Value Ref Range   Troponin I (High Sensitivity) 6 <18 ng/L    Comment: (NOTE) Elevated high sensitivity troponin I (hsTnI) values and significant  changes across serial measurements may suggest ACS but many other  chronic and acute conditions are known to elevate hsTnI results.  Refer to the "Links" section for chest pain algorithms and additional  guidance. Performed at [facility], [place], [place] [address]    Ct Angio Chest Pe W And/or Wo Contrast  Result Date: 03/03/2019 CLINICAL DATA:  Chest pain history of bladder cancer EXAM: CT ANGIOGRAPHY CHEST WITH CONTRAST TECHNIQUE: Multidetector CT imaging of the chest was performed using the standard protocol during bolus administration of intravenous contrast. Multiplanar CT image reconstructions and MIPs were obtained to evaluate the vascular anatomy. CONTRAST:  68mL OMNIPAQUE IOHEXOL 350 MG/ML SOLN COMPARISON:  Chest x-ray 03/03/2019, CT chest 11/25/2018 FINDINGS: Cardiovascular: Satisfactory opacification of the pulmonary arteries to the segmental level. No evidence of pulmonary embolism. Nonaneurysmal aorta. No dissection is seen. Mild aortic atherosclerosis. Coronary vascular calcification. Borderline cardiomegaly. Right-sided central venous port tip in the proximal right atrium. No pericardial effusion Mediastinum/Nodes: Midline trachea. No thyroid mass. Stable 12 mm right precarinal lymph node. Small hiatal hernia. Lungs/Pleura: No consolidation, pleural effusion or pneumothorax. Bilateral septal thickening with mild hazy density at the lower lobes. Upper Abdomen: No acute abnormality Musculoskeletal: No acute or suspicious osseous abnormality Review of the MIP images confirms the above findings. IMPRESSION: 1. Negative  for acute pulmonary embolus or aortic dissection 2. Bilateral interstitial thickening, suspect for interstitial lung disease. Scattered hazy bilateral lower lobe densities, probable atelectasis. Aortic Atherosclerosis (ICD10-I70.0). Electronically Signed   By: Donavan Foil M.D.   On: 03/03/2019 22:22   Dg Chest Portable 1 View  Result Date: 03/03/2019 CLINICAL DATA:  Chest pain, stage IV bladder cancer EXAM: PORTABLE CHEST 1 VIEW COMPARISON:  CT chest Nov 24, 2020 FINDINGS: Twenty right IJ approach Port-A-Cath tip terminates in the right atrium. Multiple cardiac monitoring leads overlie the chest. Coarse interstitial opacities are again seen throughout the lungs. No consolidation, features of edema, pneumothorax, or effusion. Pulmonary vascularity is normally distributed. The aorta is calcified and tortuous. The remaining cardiomediastinal contours are unremarkable. No acute osseous or soft tissue abnormality. Degenerative changes are present in the imaged spine and shoulders. IMPRESSION: 1. Right IJ approach Port-A-Cath tip terminates in the right atrium. 2. Coarse interstitial opacities throughout the lungs, unchanged. 3. No acute cardiopulmonary abnormality. Electronically Signed   By: Lovena Le M.D.   On: 03/03/2019 20:59    Review of Systems  Constitutional: Negative for chills and fever.  HENT: Negative for sore throat and tinnitus.   Eyes: Negative for blurred vision and redness.  Respiratory: Negative for cough and shortness of breath.   Cardiovascular: Positive for chest pain. Negative for palpitations, orthopnea and PND.  Gastrointestinal: Negative for abdominal pain, diarrhea, nausea and vomiting.  Genitourinary: Negative for dysuria, frequency and urgency.  Musculoskeletal: Negative for joint pain and myalgias.  Skin: Negative for rash.       No lesions  Neurological: Negative for speech change, focal weakness and weakness.  Endo/Heme/Allergies: Does not bruise/bleed easily.  No temperature intolerance  Psychiatric/Behavioral: Negative for depression and suicidal ideas.    Blood pressure 126/86, pulse 66, temperature 98.2 F (36.8 C), temperature source Oral, resp. rate 18, height 5\' 8"  (1.727 m), weight 69.4 kg, SpO2 100 %. Physical Exam  Vitals reviewed. Constitutional: He is oriented to person, place, and time. He appears well-developed and well-nourished. No distress.  HENT:  Head: Normocephalic and atraumatic.  Mouth/Throat: Oropharynx is clear and moist.  Eyes: Pupils are equal, round, and reactive to light. Conjunctivae and EOM are normal. No scleral icterus.  Neck: Normal range of motion. Neck supple. No JVD present. No tracheal deviation present. No thyromegaly present.  Cardiovascular: Normal rate, regular rhythm and normal heart sounds. Exam reveals no gallop and no friction rub.  No murmur heard. Respiratory: Effort normal and breath sounds normal. No respiratory distress.  GI: Soft. Bowel sounds are normal. He exhibits no distension. There is no abdominal tenderness.  Genitourinary:    Genitourinary Comments: Deferred   Musculoskeletal: Normal range of motion.  Lymphadenopathy:    He has no cervical adenopathy.  Neurological: He is alert and oriented to person, place, and time. No cranial nerve deficit.  Skin: Skin is warm and dry. No rash noted. No erythema.  Psychiatric: He has a normal mood and affect. His behavior is normal. Judgment and thought content normal.     Assessment/Plan This is a 73 year old male admitted for chest pain. 1.  Chest pain: Atypical; troponins have been negative.  EKGs reviewed and no significant changes.  Continue to follow cardiac biomarkers.  Monitor telemetry.  Consult cardiology. 2.  CAD: Continue Nitropaste as needed.  Also continue aspirin and Plavix. 3.  Hypertension: Controlled; continue Coreg and losartan 4.  Diabetes mellitus type 2: Hold oral hypoglycemic agents.  Sliding scale insulin while  hospitalized 5.  Hyperlipidemia: Continue statin therapy 6.  Bladder cancer: Due for chemotherapy this week. 7.  DVT prophylaxis: Lovenox 8.  GI prophylaxis: Protonix The patient is a full code.  Time spent on admission orders and patient care approximately 45 minutes  Harrie Foreman, MD 03/04/2019, 2:49 AM

## 2019-03-04 NOTE — Progress Notes (Signed)
EKG completed on patient pre ambulation and EKG post 2 laps of ambulation. EKG showed a decrease in ST elevation from >2.0 to <2.0. Patient did not complain of any chest pain. Patient after sitting down felt slightly short of breath. Patient removed facial mask and oxygen never dropped under 95%. Respiration rate stayed in low 20's. Patient recovered within minutes.

## 2019-03-04 NOTE — Progress Notes (Signed)
Patient's spouse states that patient was under a lot of stress yesterday 8/25. Pain started in upper back to shoulders and moved its way down to chest. Spouse did not wish to go into details about stress but states "a lot happened."

## 2019-03-04 NOTE — ED Provider Notes (Signed)
-----------------------------------------   1:16 AM on 03/04/2019 -----------------------------------------  Repeat troponin unchanged.  However, patient appears uncomfortable despite morphine.  Will start aspirin, ntg paste and discuss with hospitalist Dr. Jannifer Franklin to evaluate patient in the emergency department for admission.   Paulette Blanch, MD 03/04/19 [phone]

## 2019-03-04 NOTE — Consult Note (Signed)
CRITICAL CARE NOTE      CHIEF COMPLAINT:   Chest pain   HPI   Patient is a pleasant 73 yo male with hx of urothelial CA with paraortic LN extension as well as multiple other comorbid conditions as per PMH.  He came in due to severe bilateral pleuritic chest pain which was 9/10 atypical and non radiating initially with eventual radiation to left shoulder. He was admitted by ED to hospitalist service due to ongoing chest pain. He had eval for COVID which was negative on admission and HStroponin negative X3.  He was brought up to MICU for upgrade in care and further workup for unclear severe chest pain. Patient had cardiology evaluation in MICU which was negative for ACS and additional workup / LHC is deffered at this time. He had CT chest done which shows ground glass opacification bilaterally with apicobasal gradient worse at bases. He continues to have severe cp with deep inspiration.  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 This was originally diagnosed in May 2019. He was noted to have a filling defect in the lower pole collecting system of the left kidney along with para-aortic and retroperitoneal adenopathy concerning for metastatic disease. He underwent left ureteroscopy and renal pelvis biopsy which revealed small fragments of high-grade urothelial carcinoma with small focus of invasion. He was started on carboplatin and gemcitabine chemotherapy in June 2019 and was continued on 05/27/2018 for 8 cycles. He tolerated chemotherapy well except for chemo-induced anemia for which she has been getting Procrit every 2 weeks. He did have response to his disease based on scans in August 2019. However repeat scan on 05/31/2018 showed increase in the size of the primary tumor from 1.2 to 1.9 cm and  increase in left para-aortic adenopathy from 0.9 to 1.3 cm. Periportal adenopathy was stable at 1.4 cm. Second line immunotherapy was recommended. His initial biopsy specimen did not have enough sample to undergo FGFR mutation testing. He also has mild interstitial lung disease for which he sees pulmonary but he is not on home oxygen. He has B12 deficiency for which he is on oral B12. Also has diabetes and coronary artery disease.Tecentriq started on 06/17/2018.Patient noted to have disease progression in his lymph nodes in May 2020. Repeat biopsy showed metastatic urothelial carcinoma which did not have aFGFR mutation. He has been started on third line Padcev   PFT 05/28/2018>> FVC is 73% predicted, FEV1 is 85% predicted, there is no significant improvement bronchodilator.  Ratio is 90%.  Lung volumes TLC is reduced at 64%.  RV is reduced at 70%.  Ratio is normal.  DLCO is reduced at 58% predicted.  Flow volume loop is unremarkable.   PAST MEDICAL HISTORY   Past Medical History:  Diagnosis Date   Acid reflux    Anxiety    Depression    Diabetes mellitus without complication ([place])    Hyperlipidemia    Hypertension    MRSA (methicillin resistant Staphylococcus aureus) infection 1992   history   Myocardial infarction ([place]) 1995   Sleep apnea    CPAP   Urothelial cancer ([place]) 11/2017   Left Urothelial mass, chemo tx's     SURGICAL HISTORY   Past Surgical History:  Procedure Laterality Date   CATARACT EXTRACTION Left    COLONOSCOPY  2010   [facility]   COLONOSCOPY WITH PROPOFOL N/A 10/04/2016   Procedure: COLONOSCOPY WITH PROPOFOL;  Surgeon: [name], MD;  Location: [place];  Service: Endoscopy;  Laterality: N/A;   [place], 2000, 2001, 2014   CYSTOSCOPY W/ RETROGRADES Bilateral 12/10/2017   Procedure: CYSTOSCOPY WITH RETROGRADE PYELOGRAM;  Surgeon: [name], MD;  Location: [facility];  Service:  Urology;  Laterality: Bilateral;   CYSTOSCOPY W/ RETROGRADES Left 12/09/2018   Procedure: CYSTOSCOPY WITH RETROGRADE PYELOGRAM;  Surgeon: [name], MD;  Location: [facility];  Service: Urology;  Laterality: Left;   CYSTOSCOPY WITH BIOPSY Left 12/09/2018   Procedure: CYSTOSCOPY WITH URETERAL/RENAL PELVIC BIOPSY;  Surgeon: [name], MD;  Location: [facility];  Service: Urology;  Laterality: Left;   CYSTOSCOPY WITH STENT PLACEMENT Left 12/10/2017   Procedure: CYSTOSCOPY WITH STENT PLACEMENT;  Surgeon: [name], MD;  Location: [facility];  Service: Urology;  Laterality: Left;   CYSTOSCOPY WITH STENT PLACEMENT Left 12/09/2018   Procedure: CYSTOSCOPY WITH STENT PLACEMENT;  Surgeon: [name], MD;  Location: [facility];  Service: Urology;  Laterality: Left;   CYSTOSCOPY WITH URETEROSCOPY Left 12/09/2018   Procedure: CYSTOSCOPY WITH URETEROSCOPY;  Surgeon: [name], MD;  Location: [facility];  Service: Urology;  Laterality: Left;   EYE SURGERY Bilateral    cataract   INGUINAL HERNIA REPAIR Right 08/09/2015   Procedure: HERNIA REPAIR INGUINAL ADULT;  Surgeon: [name], MD;  Location: [facility];  Service: General;  Laterality: Right;   NASAL SINUS SURGERY     nuclear stress test     PORTA CATH INSERTION N/A 12/19/2017   Procedure: PORTA CATH INSERTION;  Surgeon: [name], MD;  Location: [place] [facility];  Service: Cardiovascular;  Laterality: N/A;   URETERAL BIOPSY Left 12/10/2017   Procedure: URETERAL & renal PELVIS BIOPSY;  Surgeon: [name], MD;  Location: [facility];  Service: Urology;  Laterality: Left;   URETEROSCOPY Left 12/10/2017   Procedure: URETEROSCOPY;  Surgeon: [name], MD;  Location: [facility];  Service: Urology;  Laterality: Left;     FAMILY HISTORY   Family History  Problem Relation Age of Onset   Heart disease Mother    Cancer Father        Lung and colon cancer   Heart disease Father    Emphysema Maternal Grandfather     Tuberculosis Maternal Grandmother      SOCIAL HISTORY   Social History   Tobacco Use   Smoking status: Former Smoker    Types: Cigars    Quit date: 10/09/1978    Years since quitting: 40.4   Smokeless tobacco: Former Systems developer    Types: Chew    Quit date: 12/04/1988   Tobacco comment: on occasion  Substance Use Topics   Alcohol use: Not Currently   Drug use: No     MEDICATIONS   Current Medication:  Current Facility-Administered Medications:    acetaminophen (TYLENOL) tablet 650 mg, 650 mg, Oral, Q6H PRN **OR** acetaminophen (TYLENOL) suppository 650 mg, 650 mg, Rectal, Q6H PRN, Harrie Foreman, MD   aspirin EC tablet 81 mg, 81 mg, Oral, QPM, Harrie Foreman, MD   atorvastatin (LIPITOR) tablet 40 mg, 40 mg, Oral, QHS, Harrie Foreman, MD   carvedilol (COREG) tablet 12.5 mg, 12.5 mg, Oral, BID, Harrie Foreman, MD, 12.5 mg at 03/04/19 [id number]   Chlorhexidine Gluconate Cloth 2 % PADS 6 each, 6 each, Topical, Q0600, Harrie Foreman, MD, 6 each at 03/04/19 0629   clobetasol cream (TEMOVATE) [id number] % 1 application, 1 application, Topical, PRN, Harrie Foreman, MD   docusate sodium (COLACE) capsule 100 mg,  100 mg, Oral, BID, Harrie Foreman, MD, 100 mg at 03/04/19 [id number]   enoxaparin (LOVENOX) injection 40 mg, 40 mg, Subcutaneous, Q24H, Harrie Foreman, MD   fluticasone [facility]) 50 MCG/ACT nasal spray 2 spray, 2 spray, Each Nare, Daily PRN, Harrie Foreman, MD   gabapentin (NEURONTIN) capsule 300 mg, 300 mg, Oral, TID, Harrie Foreman, MD   hydrOXYzine (ATARAX/VISTARIL) tablet 25 mg, 25 mg, Oral, Q8H PRN, Harrie Foreman, MD   insulin aspart (novoLOG) injection 0-5 Units, 0-5 Units, Subcutaneous, QHS, Harrie Foreman, MD   insulin aspart (novoLOG) injection 0-9 Units, 0-9 Units, Subcutaneous, TID WC, Harrie Foreman, MD, 1 Units at 03/04/19 0841   ketorolac (TORADOL) 15 MG/ML injection 15 mg, 15 mg, Intravenous, Q6H, Dustin Flock,  MD   loratadine (CLARITIN) tablet 10 mg, 10 mg, Oral, Daily PRN, Harrie Foreman, MD   losartan (COZAAR) tablet 100 mg, 100 mg, Oral, Daily, Harrie Foreman, MD   magnesium hydroxide (MILK OF MAGNESIA) suspension 15 mL, 15 mL, Oral, Daily PRN, Harrie Foreman, MD   nitroGLYCERIN (NITROSTAT) SL tablet 0.4 mg, 0.4 mg, Sublingual, Q5 min PRN, Dustin Flock, MD   nystatin cream (MYCOSTATIN), , Topical, BID, Harrie Foreman, MD   ondansetron [facility]) tablet 4 mg, 4 mg, Oral, Q6H PRN **OR** ondansetron (ZOFRAN) injection 4 mg, 4 mg, Intravenous, Q6H PRN, Harrie Foreman, MD   sertraline (ZOLOFT) tablet 100 mg, 100 mg, Oral, Daily, Harrie Foreman, MD, 100 mg at 03/04/19 [id number]   vitamin B-12 (CYANOCOBALAMIN) tablet 1,000 mcg, 1,000 mcg, Oral, Daily, Harrie Foreman, MD, 1,000 mcg at 03/04/19 [id number]   vitamin C (ASCORBIC ACID) tablet 1,000-1,500 mg, 1,000-1,500 mg, Oral, Daily PRN, Harrie Foreman, MD  Facility-Administered Medications Ordered in Other Encounters:    Influenza vac split quadrivalent PF (FLUZONE HIGH-DOSE) injection 0.5 mL, 0.5 mL, Intramuscular, Once, Karen Kitchens, NP    ALLERGIES   Sulfa antibiotics, Ace inhibitors, Invokana [canagliflozin], and Penicillins    REVIEW OF SYSTEMS     10 point ROS negative except as per HPI  PHYSICAL EXAMINATION   Vitals:   03/04/19 0648 03/04/19 0700  BP: 132/79 138/70  Pulse:  76  Resp:  (!) 23  Temp: 98.5 F (36.9 C) 98.4 F (36.9 C)  SpO2:  100%    GENERAL Mild distress due to chest discomfort with deep inspiratoin HEAD: Normocephalic, atraumatic.  EYES: Pupils equal, round, reactive to light.  No scleral icterus.  MOUTH: Moist mucosal membrane. NECK: Supple. No thyromegaly. No nodules. No JVD.  PULMONARY: crackles at bases bilaterally  CARDIOVASCULAR: S1 and S2. Regular rate and rhythm. No murmurs, rubs, or gallops.  GASTROINTESTINAL: Soft, nontender, non-distended. No masses. Positive  bowel sounds. No hepatosplenomegaly.  MUSCULOSKELETAL: No swelling, clubbing, or edema.  NEUROLOGIC: Mild distress due to acute illness SKIN:intact,warm,dry   PERTINENT DATA     Infusions:  Scheduled Medications:  aspirin EC  81 mg Oral QPM   atorvastatin  40 mg Oral QHS   carvedilol  12.5 mg Oral BID   Chlorhexidine Gluconate Cloth  6 each Topical Q0600   docusate sodium  100 mg Oral BID   enoxaparin (LOVENOX) injection  40 mg Subcutaneous Q24H   gabapentin  300 mg Oral TID   insulin aspart  0-5 Units Subcutaneous QHS   insulin aspart  0-9 Units Subcutaneous TID WC   ketorolac  15 mg Intravenous Q6H   losartan  100 mg Oral Daily   nystatin cream  Topical BID   sertraline  100 mg Oral Daily   vitamin B-12  1,000 mcg Oral Daily   PRN Medications: acetaminophen **OR** acetaminophen, clobetasol cream, fluticasone, hydrOXYzine, loratadine, magnesium hydroxide, nitroGLYCERIN, ondansetron **OR** ondansetron (ZOFRAN) IV, vitamin C Hemodynamic parameters:   Intake/Output: No intake/output data recorded.  Ventilator  Settings:       LAB RESULTS:  Basic Metabolic Panel: Recent Labs  Lab 02/25/19 0948 03/03/19 2058  NA 138 135  K 4.1 4.2  CL 104 101  CO2 25 24  GLUCOSE 166* 172*  BUN 21 15  CREATININE 0.74 0.52*  CALCIUM 9.6 9.3   Liver Function Tests: Recent Labs  Lab 02/25/19 0948 03/03/19 2058  AST 22 22  ALT 19 20  ALKPHOS 68 70  BILITOT 1.0 0.9  PROT 7.5 7.0  ALBUMIN 4.1 3.8   No results for input(s): LIPASE, AMYLASE in the last 168 hours. No results for input(s): AMMONIA in the last 168 hours. CBC: Recent Labs  Lab 02/25/19 0948 03/03/19 2058  WBC 6.8 9.9  NEUTROABS 4.5 7.3  HGB 11.7* 11.5*  HCT 35.2* 35.1*  MCV 86.9 88.4  PLT 191 206   Cardiac Enzymes: No results for input(s): CKTOTAL, CKMB, CKMBINDEX, TROPONINI in the last 168 hours. BNP: Invalid input(s): POCBNP CBG: Recent Labs  Lab 03/04/19 0800  GLUCAP 143*      IMAGING RESULTS:  Imaging: Ct Angio Chest Pe W And/or Wo Contrast  Result Date: 03/03/2019 CLINICAL DATA:  Chest pain history of bladder cancer EXAM: CT ANGIOGRAPHY CHEST WITH CONTRAST TECHNIQUE: Multidetector CT imaging of the chest was performed using the standard protocol during bolus administration of intravenous contrast. Multiplanar CT image reconstructions and MIPs were obtained to evaluate the vascular anatomy. CONTRAST:  96mL OMNIPAQUE IOHEXOL 350 MG/ML SOLN COMPARISON:  Chest x-ray 03/03/2019, CT chest 11/25/2018 FINDINGS: Cardiovascular: Satisfactory opacification of the pulmonary arteries to the segmental level. No evidence of pulmonary embolism. Nonaneurysmal aorta. No dissection is seen. Mild aortic atherosclerosis. Coronary vascular calcification. Borderline cardiomegaly. Right-sided central venous port tip in the proximal right atrium. No pericardial effusion Mediastinum/Nodes: Midline trachea. No thyroid mass. Stable 12 mm right precarinal lymph node. Small hiatal hernia. Lungs/Pleura: No consolidation, pleural effusion or pneumothorax. Bilateral septal thickening with mild hazy density at the lower lobes. Upper Abdomen: No acute abnormality Musculoskeletal: No acute or suspicious osseous abnormality Review of the MIP images confirms the above findings. IMPRESSION: 1. Negative for acute pulmonary embolus or aortic dissection 2. Bilateral interstitial thickening, suspect for interstitial lung disease. Scattered hazy bilateral lower lobe densities, probable atelectasis. Aortic Atherosclerosis (ICD10-I70.0). Electronically Signed   By: Donavan Foil M.D.   On: 03/03/2019 22:22   Dg Chest Portable 1 View  Result Date: 03/03/2019 CLINICAL DATA:  Chest pain, stage IV bladder cancer EXAM: PORTABLE CHEST 1 VIEW COMPARISON:  CT chest Nov 24, 2020 FINDINGS: Twenty right IJ approach Port-A-Cath tip terminates in the right atrium. Multiple cardiac monitoring leads overlie the chest. Coarse  interstitial opacities are again seen throughout the lungs. No consolidation, features of edema, pneumothorax, or effusion. Pulmonary vascularity is normally distributed. The aorta is calcified and tortuous. The remaining cardiomediastinal contours are unremarkable. No acute osseous or soft tissue abnormality. Degenerative changes are present in the imaged spine and shoulders. IMPRESSION: 1. Right IJ approach Port-A-Cath tip terminates in the right atrium. 2. Coarse interstitial opacities throughout the lungs, unchanged. 3. No acute cardiopulmonary abnormality. Electronically Signed   By: Lovena Le M.D.   On: 03/03/2019 20:59  ASSESSMENT AND PLAN    -Multidisciplinary rounds held today  Acute substernal chest pain    - likely related to underlying interstitial basal predominant bilateral ground glass infiltrates.     - patient has been on weekly chemo including immunotherapyTecentriq which is known to induce pneumonitis and intestitial lung disease.  - would rule out infectious possibilities and cardiac involvement and then use prolonged steroids as previous with bactrim ppx on outpatient basis for worsening pnemonitis and pleuritis.  -PFT consistent with ILD  - patient will follow up with primary pulmonologist Dr Ashby Dawes   Urothelial cancer with metastasis  - chronic stable   - followed by Dr Janece Canterbury and Dr Janese Banks   CAD s/p MI and stenting  cnronic stable  - cardiology on case appreciate input - status post inferior STEMI 12/95, stent RCA, stent LAD 2/99, stent RCA 09/14/1998, and stent mid LAD 10/22/2012.    ID -continue IV abx as prescibed -follow up cultures  GI/Nutrition GI PROPHYLAXIS as indicated DIET-->TF's as tolerated Constipation protocol as indicated  ENDO - ICU hypoglycemic\Hyperglycemia protocol -check FSBS per protocol   ELECTROLYTES -follow labs as needed -replace as needed -pharmacy consultation   DVT/GI PRX ordered -SCDs  TRANSFUSIONS  AS NEEDED MONITOR FSBS ASSESS the need for LABS as needed   Critical care provider statement:    Critical care time (minutes):  35   Critical care time was exclusive of:  Separately billable procedures and treating other patients   Critical care was necessary to treat or prevent imminent or life-threatening deterioration of the following conditions:  Substernal chest pain likely due to worsening ILD with concomitant immunotherapy , CAD, urothelial cancer, multiple comorbid conditions.   Critical care was time spent personally by me on the following activities:  Development of treatment plan with patient or surrogate, discussions with consultants, evaluation of patient's response to treatment, examination of patient, obtaining history from patient or surrogate, ordering and performing treatments and interventions, ordering and review of laboratory studies and re-evaluation of patient's condition.  I assumed direction of critical care for this patient from another provider in my specialty: no    This document was prepared using Dragon voice recognition software and may include unintentional dictation errors.    Ottie Glazier, M.D.  Division of [place]

## 2019-03-04 NOTE — ED Notes (Signed)
ED TO INPATIENT HANDOFF REPORT  ED Nurse Name and Phone #: Joelene Millin [phone]  S Name/Age/Gender Patrick Mcgee 73 y.o. male Room/Bed: ED17A/ED17A  Code Status   Code Status: Full Code  Home/SNF/Other Home Patient oriented to: self, place, time and situation Is this baseline? 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 Sulfa Antibiotics Other (See Comments)    Joint pain  . Ace Inhibitors Cough  . Invokana [Canagliflozin] Other (See Comments)    Leg pain  . Penicillins Rash    Did it involve swelling of the face/tongue/throat, SOB, or low BP? No Did it involve sudden or severe rash/hives, skin peeling, or any reaction on the inside of your mouth or nose? Yes Did you need to seek medical attention at a hospital or doctor's office? Yes When did it last happen?childhood allergy If all above answers are "NO", may proceed with cephalosporin use.     Level of Care/Admitting Diagnosis ED Disposition    ED Disposition Condition [facility] Area: [employer] [[id number]]  Level of Care: Telemetry [5]  Covid Evaluation: Asymptomatic Screening Protocol (No Symptoms)  Diagnosis: Chest pain [id number]  Admitting Physician: Harrie Foreman [id number]  Attending Physician: Harrie Foreman [phone]  PT Class (Do Not Modify): Observation [104]  PT Acc Code (Do Not Modify): Observation [10022]       B Medical/Surgery History Past Medical History:  Diagnosis Date  . Acid reflux   . Anxiety   . Depression   . Diabetes mellitus without complication ([place])   . Hyperlipidemia   . Hypertension    . MRSA (methicillin resistant Staphylococcus aureus) infection 1992   history  . Myocardial infarction ([place]) 1995  . Sleep apnea    CPAP  . 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 CATARACT EXTRACTION Left   . COLONOSCOPY  2010   [facility]  . COLONOSCOPY WITH PROPOFOL N/A 10/04/2016   Procedure: COLONOSCOPY WITH PROPOFOL;  Surgeon: [name], MD;  Location: [facility] ENDOSCOPY;  Service: Endoscopy;  Laterality: N/A;  . [place], 2000, 2001, 2014  . CYSTOSCOPY W/ RETROGRADES Bilateral 12/10/2017   Procedure: CYSTOSCOPY WITH RETROGRADE PYELOGRAM;  Surgeon: [name], MD;  Location: [facility] ORS;  Service: Urology;  Laterality: Bilateral;  . CYSTOSCOPY W/ RETROGRADES Left 12/09/2018   Procedure: CYSTOSCOPY WITH RETROGRADE PYELOGRAM;  Surgeon: [name], MD;  Location: [facility] ORS;  Service: Urology;  Laterality: Left;  . CYSTOSCOPY WITH BIOPSY Left 12/09/2018   Procedure: CYSTOSCOPY WITH URETERAL/RENAL PELVIC BIOPSY;  Surgeon: [name], MD;  Location: [facility] OR[facility];  Service: Urology;  Laterality: Left;  . CYSTOSCOPY WITH STENT PLACEMENT Left 12/10/2017   Procedure: CYSTOSCOPY WITH STENT PLACEMENT;  Surgeon: [name], MD;  Location: [facility] OR[facility];  Service: Urology;  Laterality: Left;  . CYSTOSCOPY WITH STENT PLACEMENT Left 12/09/2018   Procedure: CYSTOSCOPY WITH STENT PLACEMENT;  Surgeon: [name], MD;  Location: [facility];  Service: Urology;  Laterality: Left;  . CYSTOSCOPY WITH URETEROSCOPY Left 12/09/2018  Procedure: CYSTOSCOPY WITH URETEROSCOPY;  Surgeon: [name], MD;  Location: [facility];  Service: Urology;  Laterality: Left;  . EYE SURGERY Bilateral    cataract  . INGUINAL HERNIA REPAIR Right 08/09/2015   Procedure: HERNIA REPAIR INGUINAL ADULT;  Surgeon: [name], MD;  Location: [facility] OR[facility];  Service: General;  Laterality: Right;  . NASAL  SINUS SURGERY    . nuclear stress test    . PORTA CATH INSERTION N/A 12/19/2017   Procedure: PORTA CATH INSERTION;  Surgeon: [name], MD;  Location: [facility];  Service: Cardiovascular;  Laterality: N/A;  . URETERAL BIOPSY Left 12/10/2017   Procedure: URETERAL & renal PELVIS BIOPSY;  Surgeon: [name], MD;  Location: [facility];  Service: Urology;  Laterality: Left;  . URETEROSCOPY Left 12/10/2017   Procedure: URETEROSCOPY;  Surgeon: [name], MD;  Location: [facility];  Service: Urology;  Laterality: Left;     A IV Location/Drains/Wounds Patient Lines/Drains/Airways Status   Active Line/Drains/Airways    Name:   Placement date:   Placement time:   Site:   Days:   Implanted Port Right Chest   -    -    Chest      Ureteral Drain/Stent Left ureter 6 Fr.   12/10/17    1238    Left ureter   449   Ureteral Drain/Stent Left ureter 6 Fr.   12/09/18    1020    Left ureter   85   Incision (Closed) 08/09/15 Abdomen   08/09/15    0807     1303   Incision (Closed) 12/10/17 Penis Other (Comment)   12/10/17    1144     449   Incision (Closed) 12/09/18 Penis Other (Comment)   12/09/18    0937     85          Intake/Output Last 24 hours No intake or output data in the 24 hours ending 03/04/19 0546  Labs/Imaging Results for orders placed or performed during the hospital encounter of 03/03/19 (from the past 48 hour(s))  CBC with Differential     Status: Abnormal   Collection Time: 03/03/19  8:58 PM  Result Value Ref Range   WBC 9.9 4.0 - 10.5 K/uL   RBC 3.97 (L) 4.22 - 5.81 MIL/uL   Hemoglobin 11.5 (L) 13.0 - 17.0 g/dL   HCT 35.1 (L) 39.0 - 52.0 %   MCV 88.4 80.0 - 100.0 fL   MCH 29.0 26.0 - 34.0 pg   MCHC 32.8 30.0 - 36.0 g/dL   RDW 15.7 (H) 11.5 - 15.5 %   Platelets 206 150 - 400 K/uL   nRBC 0.0 0.0 - 0.2 %   Neutrophils Relative % 74 %   Neutro Abs 7.3 1.7 - 7.7 K/uL   Lymphocytes Relative 14 %   Lymphs Abs 1.4 0.7 - 4.0 K/uL   Monocytes Relative 9 %   Monocytes  Absolute 0.9 0.1 - 1.0 K/uL   Eosinophils Relative 3 %   Eosinophils Absolute 0.3 0.0 - 0.5 K/uL   Basophils Relative 0 %   Basophils Absolute 0.0 0.0 - 0.1 K/uL   Immature Granulocytes 0 %   Abs Immature Granulocytes 0.02 0.00 - 0.07 K/uL    Comment: Performed at [facility], [address] [place], [place] [address]  Comprehensive metabolic panel     Status: Abnormal   Collection Time: 03/03/19  8:58 PM  Result Value Ref Range   Sodium 135  135 - 145 mmol/L   Potassium 4.2 3.5 - 5.1 mmol/L   Chloride 101 98 - 111 mmol/L   CO2 24 22 - 32 mmol/L   Glucose, Bld 172 (H) 70 - 99 mg/dL   BUN 15 8 - 23 mg/dL   Creatinine, Ser 0.52 (L) 0.61 - 1.24 mg/dL   Calcium 9.3 8.9 - 10.3 mg/dL   Total Protein 7.0 6.5 - 8.1 g/dL   Albumin 3.8 3.5 - 5.0 g/dL   AST 22 15 - 41 U/L   ALT 20 0 - 44 U/L   Alkaline Phosphatase 70 38 - 126 U/L   Total Bilirubin 0.9 0.3 - 1.2 mg/dL   GFR calc non Af Amer >60 >60 mL/min   GFR calc Af Amer >60 >60 mL/min   Anion gap 10 5 - 15    Comment: Performed at [facility], [place], [place] [address]  Troponin I (High Sensitivity)     Status: None   Collection Time: 03/03/19  8:58 PM  Result Value Ref Range   Troponin I (High Sensitivity) 6 <18 ng/L    Comment: (NOTE) Elevated high sensitivity troponin I (hsTnI) values and significant  changes across serial measurements may suggest ACS but many other  chronic and acute conditions are known to elevate hsTnI results.  Refer to the "Links" section for chest pain algorithms and additional  guidance. Performed at [facility], [place], [place] [address]   Troponin I (High Sensitivity)     Status: None   Collection Time: 03/03/19 11:48 PM  Result Value Ref Range   Troponin I (High Sensitivity) 6 <18 ng/L    Comment: (NOTE) Elevated high sensitivity troponin I (hsTnI) values and significant  changes across serial measurements may suggest ACS but many  other  chronic and acute conditions are known to elevate hsTnI results.  Refer to the "Links" section for chest pain algorithms and additional  guidance. Performed at [facility], [place], [place] [address]    Ct Angio Chest Pe W And/or Wo Contrast  Result Date: 03/03/2019 CLINICAL DATA:  Chest pain history of bladder cancer EXAM: CT ANGIOGRAPHY CHEST WITH CONTRAST TECHNIQUE: Multidetector CT imaging of the chest was performed using the standard protocol during bolus administration of intravenous contrast. Multiplanar CT image reconstructions and MIPs were obtained to evaluate the vascular anatomy. CONTRAST:  80mL OMNIPAQUE IOHEXOL 350 MG/ML SOLN COMPARISON:  Chest x-ray 03/03/2019, CT chest 11/25/2018 FINDINGS: Cardiovascular: Satisfactory opacification of the pulmonary arteries to the segmental level. No evidence of pulmonary embolism. Nonaneurysmal aorta. No dissection is seen. Mild aortic atherosclerosis. Coronary vascular calcification. Borderline cardiomegaly. Right-sided central venous port tip in the proximal right atrium. No pericardial effusion Mediastinum/Nodes: Midline trachea. No thyroid mass. Stable 12 mm right precarinal lymph node. Small hiatal hernia. Lungs/Pleura: No consolidation, pleural effusion or pneumothorax. Bilateral septal thickening with mild hazy density at the lower lobes. Upper Abdomen: No acute abnormality Musculoskeletal: No acute or suspicious osseous abnormality Review of the MIP images confirms the above findings. IMPRESSION: 1. Negative for acute pulmonary embolus or aortic dissection 2. Bilateral interstitial thickening, suspect for interstitial lung disease. Scattered hazy bilateral lower lobe densities, probable atelectasis. Aortic Atherosclerosis (ICD10-I70.0). Electronically Signed   By: Donavan Foil M.D.   On: 03/03/2019 22:22   Dg Chest Portable 1 View  Result Date: 03/03/2019 CLINICAL DATA:  Chest pain, stage IV bladder cancer  EXAM: PORTABLE CHEST 1 VIEW COMPARISON:  CT chest  Nov 24, 2020 FINDINGS: Twenty right IJ approach Port-A-Cath tip terminates in the right atrium. Multiple cardiac monitoring leads overlie the chest. Coarse interstitial opacities are again seen throughout the lungs. No consolidation, features of edema, pneumothorax, or effusion. Pulmonary vascularity is normally distributed. The aorta is calcified and tortuous. The remaining cardiomediastinal contours are unremarkable. No acute osseous or soft tissue abnormality. Degenerative changes are present in the imaged spine and shoulders. IMPRESSION: 1. Right IJ approach Port-A-Cath tip terminates in the right atrium. 2. Coarse interstitial opacities throughout the lungs, unchanged. 3. No acute cardiopulmonary abnormality. Electronically Signed   By: Lovena Le M.D.   On: 03/03/2019 20:59    Pending Labs Unresulted Labs (From admission, onward)    Start     Ordered   03/11/19 0500  Creatinine, serum  (enoxaparin (LOVENOX)    CrCl >/= 30 ml/min)  Weekly,   STAT    Comments: while on enoxaparin therapy    03/04/19 0405   03/04/19 0406  TSH  Add-on,   AD     03/04/19 0405   03/04/19 0119  SARS CORONAVIRUS 2 (TAT 6-12 HRS) Nasal Swab Aptima Multi Swab  (Asymptomatic/Tier 2 Patients Labs)  Once,   STAT    Question Answer Comment  Is this test for diagnosis or screening Screening   Symptomatic for COVID-19 as defined by CDC No   Hospitalized for COVID-19 No   Admitted to ICU for COVID-19 No   Previously tested for COVID-19 Yes   Resident in a congregate (group) care setting No   Employed in healthcare setting No      03/04/19 0118          Vitals/Pain Today's Vitals   03/04/19 0330 03/04/19 0345 03/04/19 0400 03/04/19 0406  BP: 107/70 104/71 112/71   Pulse: 74 74 73   Resp:      Temp:      TempSrc:      SpO2: 93% 93% 94%   Weight:      Height:      PainSc:    Asleep    Isolation Precautions No active  isolations  Medications Medications  clobetasol cream (TEMOVATE) [id number] % 1 application (has no administration in time range)  atorvastatin (LIPITOR) tablet 40 mg (has no administration in time range)  nystatin cream (MYCOSTATIN) (has no administration in time range)  hydrOXYzine (ATARAX/VISTARIL) tablet 25 mg (has no administration in time range)  losartan (COZAAR) tablet 100 mg (has no administration in time range)  carvedilol (COREG) tablet 12.5 mg (has no administration in time range)  fluticasone (FLONASE) 50 MCG/ACT nasal spray 2 spray (has no administration in time range)  aspirin EC tablet 81 mg (has no administration in time range)  vitamin C (ASCORBIC ACID) tablet 1,000-1,500 mg (has no administration in time range)  vitamin B-12 (CYANOCOBALAMIN) tablet 1,000 mcg (has no administration in time range)  magnesium hydroxide (MILK OF MAGNESIA) suspension 15 mL (has no administration in time range)  sertraline (ZOLOFT) tablet 100 mg (has no administration in time range)  gabapentin (NEURONTIN) capsule 300 mg (has no administration in time range)  loratadine (CLARITIN) tablet 10 mg (has no administration in time range)  enoxaparin (LOVENOX) injection 40 mg (has no administration in time range)  acetaminophen (TYLENOL) tablet 650 mg (has no administration in time range)    Or  acetaminophen (TYLENOL) suppository 650 mg (has no administration in time range)  ondansetron (ZOFRAN) tablet 4 mg (has no administration in time range)    Or  ondansetron (ZOFRAN) injection 4 mg (has no administration in time range)  docusate sodium (COLACE) capsule 100 mg (has no administration in time range)  insulin aspart (novoLOG) injection 0-9 Units (has no administration in time range)  insulin aspart (novoLOG) injection 0-5 Units (has no administration in time range)  nitroGLYCERIN (NITROSTAT) SL tablet 0.4 mg (has no administration in time range)  morphine 4 MG/ML injection 4 mg (4 mg Intravenous Given  03/03/19 2151)  ondansetron (ZOFRAN) injection 4 mg (4 mg Intravenous Given 03/03/19 2151)  iohexol (OMNIPAQUE) 350 MG/ML injection 75 mL (75 mLs Intravenous Contrast Given 03/03/19 2158)  morphine 4 MG/ML injection 4 mg (4 mg Intravenous Given 03/03/19 2356)  aspirin chewable tablet 324 mg (324 mg Oral Given 03/04/19 0127)  nitroGLYCERIN (NITROGLYN) 2 % ointment 0.5 inch (0.5 inches Topical Given 03/04/19 0127)    Mobility walks Low fall risk   Focused Assessments Cardiac Assessment Handoff:    Lab Results  Component Value Date   TROPONINI <0.03 12/17/2017   No results found for: DDIMER Does the Patient currently have chest pain? No     R Recommendations: See Admitting Provider Note  Report given to:   Additional Notes:

## 2019-03-04 NOTE — Progress Notes (Signed)
[facility] at [facility]                                                                                                                                                                                  Patient Demographics   Patrick Mcgee, is a 73 y.o. male, DOB - 1946/01/13, EH:[id number]  Admit date - 03/03/2019   Admitting Physician Harrie Foreman, MD  Outpatient Primary MD for the patient is Jerrol Banana., MD   LOS - 0  Subjective: Patient complains of pain in the shoulder and chest worse with deep breathing and movement    Review of Systems:   CONSTITUTIONAL: No documented fever. No fatigue, weakness. No weight gain, no weight loss.  EYES: No blurry or double vision.  ENT: No tinnitus. No postnasal drip. No redness of the oropharynx.  RESPIRATORY: No cough, no wheeze, no hemoptysis. No dyspnea.  CARDIOVASCULAR: Positive chest pain. No orthopnea. No palpitations. No syncope.  GASTROINTESTINAL: No nausea, no vomiting or diarrhea. No abdominal pain. No melena or hematochezia.  GENITOURINARY: No dysuria or hematuria.  ENDOCRINE: No polyuria or nocturia. No heat or cold intolerance.  HEMATOLOGY: No anemia. No bruising. No bleeding.  INTEGUMENTARY: No rashes. No lesions.  MUSCULOSKELETAL: No arthritis. No swelling. No gout.  NEUROLOGIC: No numbness, tingling, or ataxia. No seizure-type activity.  PSYCHIATRIC: No anxiety. No insomnia. No ADD.    Vitals:   Vitals:   03/04/19 0700 03/04/19 0800 03/04/19 0900 03/04/19 1000  BP: 138/70 139/85 119/70 121/75  Pulse: 76 70 70   Resp: (!) 23 15 16 16   Temp: 98.4 F (36.9 C)     TempSrc: Oral     SpO2: 100% 99% 97%   Weight:      Height:        Wt Readings from Last 3 Encounters:  03/04/19 69.4 kg  02/27/19 69.4 kg  02/25/19 69.7 kg     Intake/Output Summary (Last 24 hours) at 03/04/2019 1454 Last data filed at 03/04/2019 1300 Gross per 24 hour  Intake 240 ml  Output 325 ml  Net  -85 ml    Physical Exam:   GENERAL: Pleasant-appearing in no apparent distress.  HEAD, EYES, EARS, NOSE AND THROAT: Atraumatic, normocephalic. Extraocular muscles are intact. Pupils equal and reactive to light. Sclerae anicteric. No conjunctival injection. No oro-pharyngeal erythema.  NECK: Supple. There is no jugular venous distention. No bruits, no lymphadenopathy, no thyromegaly.  HEART: Regular rate and rhythm,. No murmurs, no rubs, no clicks.  LUNGS: Clear to auscultation bilaterally. No rales or rhonchi. No wheezes.  ABDOMEN: Soft, flat, nontender, nondistended. Has good bowel sounds. No hepatosplenomegaly appreciated.  EXTREMITIES:  No evidence of any cyanosis, clubbing, or peripheral edema.  +2 pedal and radial pulses bilaterally.  NEUROLOGIC: The patient is alert, awake, and oriented x3 with no focal motor or sensory deficits appreciated bilaterally.  SKIN: Moist and warm with no rashes appreciated.  Psych: Not anxious, depressed LN: No inguinal LN enlargement    Antibiotics   Anti-infectives (From admission, onward)   None      Medications   Scheduled Meds: . aspirin EC  81 mg Oral QPM  . atorvastatin  40 mg Oral QHS  . carvedilol  12.5 mg Oral BID  . Chlorhexidine Gluconate Cloth  6 each Topical Q0600  . docusate sodium  100 mg Oral BID  . enoxaparin (LOVENOX) injection  40 mg Subcutaneous Q24H  . gabapentin  300 mg Oral TID  . insulin aspart  0-5 Units Subcutaneous QHS  . insulin aspart  0-9 Units Subcutaneous TID WC  . ketorolac  15 mg Intravenous Q6H  . losartan  100 mg Oral Q2000  . methylPREDNISolone (SOLU-MEDROL) injection  60 mg Intravenous Q12H  . sertraline  100 mg Oral Daily  . vitamin B-12  1,000 mcg Oral Daily   Continuous Infusions: PRN Meds:.acetaminophen **OR** acetaminophen, clobetasol cream, fluticasone, hydrOXYzine, loratadine, magnesium hydroxide, nitroGLYCERIN, nystatin cream, ondansetron **OR** ondansetron (ZOFRAN) IV, vitamin C   Data  Review:   Micro Results Recent Results (from the past 240 hour(s))  SARS CORONAVIRUS 2 (TAT 6-12 HRS) Nasal Swab Aptima Multi Swab     Status: None   Collection Time: 03/04/19  1:29 AM   Specimen: Aptima Multi Swab; Nasal Swab  Result Value Ref Range Status   SARS Coronavirus 2 NEGATIVE NEGATIVE Final    Comment: (NOTE) SARS-CoV-2 target nucleic acids are NOT DETECTED. The SARS-CoV-2 RNA is generally detectable in upper and lower respiratory specimens during the acute phase of infection. Negative results do not preclude SARS-CoV-2 infection, do not rule out co-infections with other pathogens, and should not be used as the sole basis for treatment or other patient management decisions. Negative results must be combined with clinical observations, patient history, and epidemiological information. The expected result is Negative. Fact Sheet for Patients: SugarRoll.be Fact Sheet for Healthcare Providers: https://www.woods-mathews.com/ This test is not yet approved or cleared by the [place] FDA and  has been authorized for detection and/or diagnosis of SARS-CoV-2 by FDA under an Emergency Use Authorization (EUA). This EUA will remain  in effect (meaning this test can be used) for the duration of the COVID-19 declaration under Section 56 4(b)(1) of the Act, 21 U.S.C. section 360bbb-3(b)(1), unless the authorization is terminated or revoked sooner. Performed at [facility], [place] [address] [place], [place] [address]     Radiology Reports Ct Angio Chest Pe W And/or Wo Contrast  Result Date: 03/03/2019 CLINICAL DATA:  Chest pain history of bladder cancer EXAM: CT ANGIOGRAPHY CHEST WITH CONTRAST TECHNIQUE: Multidetector CT imaging of the chest was performed using the standard protocol during bolus administration of intravenous contrast. Multiplanar CT image reconstructions and MIPs were obtained to evaluate the vascular anatomy.  CONTRAST:  66mL OMNIPAQUE IOHEXOL 350 MG/ML SOLN COMPARISON:  Chest x-ray 03/03/2019, CT chest 11/25/2018 FINDINGS: Cardiovascular: Satisfactory opacification of the pulmonary arteries to the segmental level. No evidence of pulmonary embolism. Nonaneurysmal aorta. No dissection is seen. Mild aortic atherosclerosis. Coronary vascular calcification. Borderline cardiomegaly. Right-sided central venous port tip in the proximal right atrium. No pericardial effusion Mediastinum/Nodes: Midline trachea. No thyroid mass. Stable 12 mm right precarinal  lymph node. Small hiatal hernia. Lungs/Pleura: No consolidation, pleural effusion or pneumothorax. Bilateral septal thickening with mild hazy density at the lower lobes. Upper Abdomen: No acute abnormality Musculoskeletal: No acute or suspicious osseous abnormality Review of the MIP images confirms the above findings. IMPRESSION: 1. Negative for acute pulmonary embolus or aortic dissection 2. Bilateral interstitial thickening, suspect for interstitial lung disease. Scattered hazy bilateral lower lobe densities, probable atelectasis. Aortic Atherosclerosis (ICD10-I70.0). Electronically Signed   By: Donavan Foil M.D.   On: 03/03/2019 22:22   Dg Chest Portable 1 View  Result Date: 03/03/2019 CLINICAL DATA:  Chest pain, stage IV bladder cancer EXAM: PORTABLE CHEST 1 VIEW COMPARISON:  CT chest Nov 24, 2020 FINDINGS: Twenty right IJ approach Port-A-Cath tip terminates in the right atrium. Multiple cardiac monitoring leads overlie the chest. Coarse interstitial opacities are again seen throughout the lungs. No consolidation, features of edema, pneumothorax, or effusion. Pulmonary vascularity is normally distributed. The aorta is calcified and tortuous. The remaining cardiomediastinal contours are unremarkable. No acute osseous or soft tissue abnormality. Degenerative changes are present in the imaged spine and shoulders. IMPRESSION: 1. Right IJ approach Port-A-Cath tip terminates  in the right atrium. 2. Coarse interstitial opacities throughout the lungs, unchanged. 3. No acute cardiopulmonary abnormality. Electronically Signed   By: Lovena Le M.D.   On: 03/03/2019 20:59     CBC Recent Labs  Lab 03/03/19 2058  WBC 9.9  HGB 11.5*  HCT 35.1*  PLT 206  MCV 88.4  MCH 29.0  MCHC 32.8  RDW 15.7*  LYMPHSABS 1.4  MONOABS 0.9  EOSABS 0.3  BASOSABS 0.0    Chemistries  Recent Labs  Lab 03/03/19 2058  NA 135  K 4.2  CL 101  CO2 24  GLUCOSE 172*  BUN 15  CREATININE 0.52*  CALCIUM 9.3  AST 22  ALT 20  ALKPHOS 70  BILITOT 0.9   ------------------------------------------------------------------------------------------------------------------ estimated creatinine clearance is 78.4 mL/min (A) (by C-G formula based on SCr of 0.52 mg/dL (L)). ------------------------------------------------------------------------------------------------------------------ No results for input(s): HGBA1C in the last 72 hours. ------------------------------------------------------------------------------------------------------------------ No results for input(s): CHOL, HDL, LDLCALC, TRIG, CHOLHDL, LDLDIRECT in the last 72 hours. ------------------------------------------------------------------------------------------------------------------ Recent Labs    03/04/19 0639  TSH 1.977   ------------------------------------------------------------------------------------------------------------------ No results for input(s): VITAMINB12, FOLATE, FERRITIN, TIBC, IRON, RETICCTPCT in the last 72 hours.  Coagulation profile No results for input(s): INR, PROTIME in the last 168 hours.  No results for input(s): DDIMER in the last 72 hours.  Cardiac Enzymes No results for input(s): CKMB, TROPONINI, MYOGLOBIN in the last 168 hours.  Invalid input(s): CK ------------------------------------------------------------------------------------------------------------------ Invalid  input(s): [place]   This is a 73 year old male admitted for chest pain. 1.  Chest pain: Atypical; troponins have been negative.    Patient has reproducible pain worse with movement CT scan of the chest is negative Appears very atypical appears musculoskeletal in nature will try some Toradol appreciate cardiology input  2.  CAD: Continue Nitropaste as needed.  Also continue aspirin and Plavix. 3.  Hypertension: Controlled; continue Coreg and losartan 4.  Diabetes mellitus type 2: Hold oral hypoglycemic agents.  Sliding scale insulin while hospitalized 5.  Hyperlipidemia: Continue statin therapy 6.  Bladder cancer: Due for chemotherapy will need outpatient follow-up. 7.  DVT prophylaxis: Lovenox 8.  GI prophylaxis: Protonix     Code Status Orders  (From admission, onward)         Start     Ordered   03/04/19 0406  Full code  Continuous     03/04/19 0405        Code Status History    Date Active Date Inactive Code Status Order ID Comments User Context   12/17/2017 0916 12/19/2017 2038 Full Code [id number]  Saundra Shelling, MD Inpatient   Advance Care Planning Activity    Advance Directive Documentation     Most Recent Value  Type of Advance Directive  Living will, Healthcare Power of Attorney  Pre-existing out of facility DNR order (yellow form or pink MOST form)  -  "MOST" Form in Place?  -           Consults cardiology, oncology  DVT Prophylaxis  Lovenox   Lab Results  Component Value Date   PLT 206 03/03/2019     Time Spent in minutes 35-minute  Greater than 50% of time spent in care coordination and counseling patient regarding the condition and plan of care.   Dustin Flock M.D on 03/04/2019 at 2:54 PM  Between 7am to 6pm - Pager - [phone]  After 6pm go to [email or web address] - Proofreader  [employer]   Office  [phone]

## 2019-03-04 NOTE — Progress Notes (Signed)
Oncology at bedside

## 2019-03-04 NOTE — Consult Note (Signed)
Hematology/Oncology Consult note [facility] Telephone:([phone] [phone]  Patient Care Team: Jerrol Banana., MD as PCP - General (Family Medicine) Dingeldein, [name], MD as Consulting Physician (Ophthalmology) [name] as Consulting Physician (Internal Medicine) [name], MD as Consulting Physician (Urology) [name], MD as Referring Physician (Hematology and Oncology) [name], MD as Consulting Physician (Pulmonary Disease)   Name of the patient: Patrick Mcgee  [id number]  Oct 23, 1945    Reason for consult: Metastatic bladder cancer admitted for chest pain   Requesting physician: Dr. 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  He is currently on third line treatment with padcev.  He last received his treatment a week ago.  Patient presented to the ER with sudden onset acute episode of chest pain which was radiating up his left shoulder as well as in his left arm.  EKG showed old anterolateral MI but no evidence of acute MI.  Troponin x3 have been negative.  CTA did not reveal any evidence of pulmonary embolism.  He has been also evaluated by cardiology and thought to have atypical chest pain.  He does have a prior history of MI and stent placements in the past.  Patient received IV morphine for his chest pain and does report improvement in his symptoms since then.  Denies any chest pai or so currently   Pain scale- 0   Review of systems- Review of Systems  Constitutional: Positive for malaise/fatigue. Negative for chills, fever and weight loss.  HENT: Negative for congestion, ear discharge and nosebleeds.   Eyes: Negative for blurred vision.  Respiratory: Negative for cough, hemoptysis, sputum production, shortness of  breath and wheezing.   Cardiovascular: Negative for chest pain, palpitations, orthopnea and claudication.  Gastrointestinal: Negative for abdominal pain, blood in stool, constipation, diarrhea, heartburn, melena, nausea and vomiting.  Genitourinary: Negative for dysuria, flank pain, frequency, hematuria and urgency.  Musculoskeletal: Negative for back pain, joint pain and myalgias.  Skin: Negative for rash.  Neurological: Positive for sensory change (peripheral neuropathy). Negative for dizziness, tingling, focal weakness, seizures, weakness and headaches.  Endo/Heme/Allergies: Does not bruise/bleed easily.  Psychiatric/Behavioral: Negative for depression and suicidal ideas. The patient does not have insomnia.     Allergies  Allergen Reactions  . Sulfa Antibiotics Other (See Comments)    Joint pain  . Ace Inhibitors Cough  . Invokana [Canagliflozin] Other (See Comments)    Leg pain  . Penicillins Rash    Did it involve swelling of the face/tongue/throat, SOB, or low BP? No Did it involve sudden or severe rash/hives, skin peeling, or any reaction on the inside of your mouth or nose? Yes Did you need to seek medical attention at a hospital or doctor's office? Yes When did it last happen?childhood allergy If all above answers are "NO", may proceed with cephalosporin use.     Patient Active Problem List   Diagnosis Date Noted  . Chest pain 03/04/2019  . Anemia due to antineoplastic chemotherapy 03/03/2018  . Cough 03/03/2018  . Chemotherapy induced neutropenia ([place]) 01/22/2018  . B12 deficiency 01/01/2018  . Hypomagnesemia 12/21/2017  . Encounter for antineoplastic chemotherapy 12/21/2017  . Malignant neoplasm of kidney ([place])   . Urothelial cancer ([place]) 12/18/2017  . Malnutrition of moderate degree 12/18/2017  . Therapeutic opioid induced constipation   .  Palliative care encounter   . Dehydration 12/17/2017  . Goals of care, counseling/discussion 12/13/2017  . Urothelial  carcinoma of kidney, left ([place]) 12/07/2017  . Right inguinal hernia 07/17/2015  . Family history of colon cancer 07/17/2015  . Allergic rhinitis 11/12/2014  . Airway hyperreactivity 11/12/2014  . Atherosclerosis of coronary artery 11/12/2014  . Cheilitis 11/12/2014  . Narrowing of intervertebral disc space 11/12/2014  . Deflected nasal septum 11/12/2014  . HLD (hyperlipidemia) 11/12/2014  . BP (high blood pressure) 11/12/2014  . Adult hypothyroidism 11/12/2014  . Sleep apnea 11/12/2014  . Diabetes mellitus, type 2 ([place]) 11/12/2014  . Degenerative disc disease, lumbar 09/24/2012  . Furunculosis 04/23/2012     Past Medical History:  Diagnosis Date  . Acid reflux   . Anxiety   . Depression   . Diabetes mellitus without complication ([place])   . Hyperlipidemia   . Hypertension   . MRSA (methicillin resistant Staphylococcus aureus) infection 1992   history  . Myocardial infarction ([place]) 1995  . Sleep apnea    CPAP  . Urothelial cancer ([place]) 11/2017   Left Urothelial mass, chemo tx's     Past Surgical History:  Procedure Laterality Date  . CATARACT EXTRACTION Left   . COLONOSCOPY  2010   [facility]  . COLONOSCOPY WITH PROPOFOL N/A 10/04/2016   Procedure: COLONOSCOPY WITH PROPOFOL;  Surgeon: [name], MD;  Location: [facility] ENDOSCOPY;  Service: Endoscopy;  Laterality: N/A;  . [place], 2000, 2001, 2014  . CYSTOSCOPY W/ RETROGRADES Bilateral 12/10/2017   Procedure: CYSTOSCOPY WITH RETROGRADE PYELOGRAM;  Surgeon: [name], MD;  Location: [facility] ORS;  Service: Urology;  Laterality: Bilateral;  . CYSTOSCOPY W/ RETROGRADES Left 12/09/2018   Procedure: CYSTOSCOPY WITH RETROGRADE PYELOGRAM;  Surgeon: [name], MD;  Location: [facility] ORS;  Service: Urology;  Laterality: Left;  . CYSTOSCOPY WITH BIOPSY Left 12/09/2018   Procedure: CYSTOSCOPY WITH URETERAL/RENAL PELVIC BIOPSY;  Surgeon: [name], MD;  Location: [facility] ORS;  Service:  Urology;  Laterality: Left;  . CYSTOSCOPY WITH STENT PLACEMENT Left 12/10/2017   Procedure: CYSTOSCOPY WITH STENT PLACEMENT;  Surgeon: [name], MD;  Location: [facility] ORS;  Service: Urology;  Laterality: Left;  . CYSTOSCOPY WITH STENT PLACEMENT Left 12/09/2018   Procedure: CYSTOSCOPY WITH STENT PLACEMENT;  Surgeon: [name], MD;  Location: [facility] ORS;  Service: Urology;  Laterality: Left;  . CYSTOSCOPY WITH URETEROSCOPY Left 12/09/2018   Procedure: CYSTOSCOPY WITH URETEROSCOPY;  Surgeon: [name], MD;  Location: [facility] ORS;  Service: Urology;  Laterality: Left;  . EYE SURGERY Bilateral    cataract  . INGUINAL HERNIA REPAIR Right 08/09/2015   Procedure: HERNIA REPAIR INGUINAL ADULT;  Surgeon: [name], MD;  Location: [facility] ORS;  Service: General;  Laterality: Right;  . NASAL SINUS SURGERY    . nuclear stress test    . PORTA CATH INSERTION N/A 12/19/2017   Procedure: PORTA CATH INSERTION;  Surgeon: [name], MD;  Location: [facility] CV LAB;  Service: Cardiovascular;  Laterality: N/A;  . URETERAL BIOPSY Left 12/10/2017   Procedure: URETERAL & renal PELVIS BIOPSY;  Surgeon: [name], MD;  Location: [facility] ORS;  Service: Urology;  Laterality: Left;  . URETEROSCOPY Left 12/10/2017   Procedure: URETEROSCOPY;  Surgeon: [name], MD;  Location: [facility] ORS;  Service: Urology;  Laterality: Left;    Social History   Socioeconomic History  . Marital status: Married    Spouse name: [name]  . Number of children: 1  .  Years of education: Not on file  . Highest education level: Associate degree: occupational, Hotel manager, or vocational program  Occupational History  . Occupation: retired  Scientific laboratory technician  . Financial resource strain: Not hard at all  . Food insecurity    Worry: Never true    Inability: Never true  . Transportation needs    Medical: No    Non-medical: No  Tobacco Use  . Smoking status: Former Smoker    Types: Cigars    Quit date: 10/09/1978    Years  since quitting: 40.4  . Smokeless tobacco: Former Systems developer    Types: Chew    Quit date: 12/04/1988  . Tobacco comment: on occasion  Substance and Sexual Activity  . Alcohol use: Not Currently  . Drug use: No  . Sexual activity: Not Currently  Lifestyle  . Physical activity    Days per week: 0 days    Minutes per session: 0 min  . Stress: Only a little  Relationships  . Social connections    Talks on phone: More than three times a week    Gets together: More than three times a week    Attends religious service: More than 4 times per year    Active member of club or organization: Not on file    Attends meetings of clubs or organizations: Not on file    Relationship status: Married  . Intimate partner violence    Fear of current or ex partner: No    Emotionally abused: No    Physically abused: No    Forced sexual activity: No  Other Topics Concern  . Not on file  Social History Narrative  . Not on file     Family History  Problem Relation Age of Onset  . Heart disease Mother   . Cancer Father        Lung and colon cancer  . Heart disease Father   . Emphysema Maternal Grandfather   . Tuberculosis Maternal Grandmother      Current Facility-Administered Medications:  .  acetaminophen (TYLENOL) tablet 650 mg, 650 mg, Oral, Q6H PRN **OR** acetaminophen (TYLENOL) suppository 650 mg, 650 mg, Rectal, Q6H PRN, Harrie Foreman, MD .  aspirin EC tablet 81 mg, 81 mg, Oral, QPM, Harrie Foreman, MD .  atorvastatin (LIPITOR) tablet 40 mg, 40 mg, Oral, QHS, Harrie Foreman, MD .  carvedilol (COREG) tablet 12.5 mg, 12.5 mg, Oral, BID, Harrie Foreman, MD, 12.5 mg at 03/04/19 [id number] .  Chlorhexidine Gluconate Cloth 2 % PADS 6 each, 6 each, Topical, Q0600, Harrie Foreman, MD, 6 each at 03/04/19 [phone] .  clobetasol cream (TEMOVATE) [id number] % 1 application, 1 application, Topical, PRN, Harrie Foreman, MD .  docusate sodium (COLACE) capsule 100 mg, 100 mg, Oral, BID, Harrie Foreman, MD, 100 mg at 03/04/19 [id number] .  enoxaparin (LOVENOX) injection 40 mg, 40 mg, Subcutaneous, Q24H, Harrie Foreman, MD .  fluticasone [facility]) 50 MCG/ACT nasal spray 2 spray, 2 spray, Each Nare, Daily PRN, Harrie Foreman, MD .  gabapentin (NEURONTIN) capsule 300 mg, 300 mg, Oral, TID, Harrie Foreman, MD .  hydrOXYzine (ATARAX/VISTARIL) tablet 25 mg, 25 mg, Oral, Q8H PRN, Harrie Foreman, MD .  insulin aspart (novoLOG) injection 0-5 Units, 0-5 Units, Subcutaneous, QHS, Harrie Foreman, MD .  insulin aspart (novoLOG) injection 0-9 Units, 0-9 Units, Subcutaneous, TID WC, Harrie Foreman, MD, 5 Units at 03/04/19 1151 .  ketorolac (TORADOL) 15 MG/ML injection  15 mg, 15 mg, Intravenous, Q6H, Dustin Flock, MD, 15 mg at 03/04/19 1126 .  loratadine (CLARITIN) tablet 10 mg, 10 mg, Oral, Daily PRN, Harrie Foreman, MD .  losartan (COZAAR) tablet 100 mg, 100 mg, Oral, Q2000, Dustin Flock, MD .  magnesium hydroxide (MILK OF MAGNESIA) suspension 15 mL, 15 mL, Oral, Daily PRN, Harrie Foreman, MD .  methylPREDNISolone sodium succinate (SOLU-MEDROL) 125 mg/2 mL injection 60 mg, 60 mg, Intravenous, Q12H, Ottie Glazier, MD, 60 mg at 03/04/19 1126 .  nitroGLYCERIN (NITROSTAT) SL tablet 0.4 mg, 0.4 mg, Sublingual, Q5 min PRN, Dustin Flock, MD .  nystatin cream (MYCOSTATIN), , Topical, BID PRN, Dustin Flock, MD .  ondansetron (ZOFRAN) tablet 4 mg, 4 mg, Oral, Q6H PRN **OR** ondansetron (ZOFRAN) injection 4 mg, 4 mg, Intravenous, Q6H PRN, Harrie Foreman, MD .  sertraline (ZOLOFT) tablet 100 mg, 100 mg, Oral, Daily, Harrie Foreman, MD, 100 mg at 03/04/19 [id number] .  vitamin B-12 (CYANOCOBALAMIN) tablet 1,000 mcg, 1,000 mcg, Oral, Daily, Harrie Foreman, MD, 1,000 mcg at 03/04/19 [phone] .  vitamin C (ASCORBIC ACID) tablet 1,000-1,500 mg, 1,000-1,500 mg, Oral, Daily PRN, Harrie Foreman, MD  Facility-Administered Medications Ordered in Other Encounters:  .   Influenza vac split quadrivalent PF (FLUZONE HIGH-DOSE) injection 0.5 mL, 0.5 mL, Intramuscular, Once, Karen Kitchens, NP   Physical exam:  Vitals:   03/04/19 0700 03/04/19 0800 03/04/19 0900 03/04/19 1000  BP: 138/70 139/85 119/70 121/75  Pulse: 76 70 70   Resp: (!) 23 15 16 16   Temp: 98.4 F (36.9 C)     TempSrc: Oral     SpO2: 100% 99% 97%   Weight:      Height:       Physical Exam Constitutional:      Comments: Elderly gentleman in no acute distress  HENT:     Head: Normocephalic and atraumatic.  Eyes:     Pupils: Pupils are equal, round, and reactive to light.  Neck:     Musculoskeletal: Normal range of motion.  Cardiovascular:     Rate and Rhythm: Normal rate and regular rhythm.     Heart sounds: Normal heart sounds.  Pulmonary:     Effort: Pulmonary effort is normal.     Breath sounds: Normal breath sounds.  Abdominal:     General: Bowel sounds are normal.     Palpations: Abdomen is soft.  Skin:    General: Skin is warm and dry.  Neurological:     Mental Status: He is alert and oriented to person, place, and time.        CMP Latest Ref Rng & Units 03/03/2019  Glucose 70 - 99 mg/dL 172(H)  BUN 8 - 23 mg/dL 15  Creatinine 0.61 - 1.24 mg/dL 0.52(L)  Sodium 135 - 145 mmol/L 135  Potassium 3.5 - 5.1 mmol/L 4.2  Chloride 98 - 111 mmol/L 101  CO2 22 - 32 mmol/L 24  Calcium 8.9 - 10.3 mg/dL 9.3  Total Protein 6.5 - 8.1 g/dL 7.0  Total Bilirubin 0.3 - 1.2 mg/dL 0.9  Alkaline Phos 38 - 126 U/L 70  AST 15 - 41 U/L 22  ALT 0 - 44 U/L 20   CBC Latest Ref Rng & Units 03/03/2019  WBC 4.0 - 10.5 K/uL 9.9  Hemoglobin 13.0 - 17.0 g/dL 11.5(L)  Hematocrit 39.0 - 52.0 % 35.1(L)  Platelets 150 - 400 K/uL 206    @IMAGES @  Ct Angio Chest Pe W And/or Wo  Contrast  Result Date: 03/03/2019 CLINICAL DATA:  Chest pain history of bladder cancer EXAM: CT ANGIOGRAPHY CHEST WITH CONTRAST TECHNIQUE: Multidetector CT imaging of the chest was performed using the standard protocol  during bolus administration of intravenous contrast. Multiplanar CT image reconstructions and MIPs were obtained to evaluate the vascular anatomy. CONTRAST:  26mL OMNIPAQUE IOHEXOL 350 MG/ML SOLN COMPARISON:  Chest x-ray 03/03/2019, CT chest 11/25/2018 FINDINGS: Cardiovascular: Satisfactory opacification of the pulmonary arteries to the segmental level. No evidence of pulmonary embolism. Nonaneurysmal aorta. No dissection is seen. Mild aortic atherosclerosis. Coronary vascular calcification. Borderline cardiomegaly. Right-sided central venous port tip in the proximal right atrium. No pericardial effusion Mediastinum/Nodes: Midline trachea. No thyroid mass. Stable 12 mm right precarinal lymph node. Small hiatal hernia. Lungs/Pleura: No consolidation, pleural effusion or pneumothorax. Bilateral septal thickening with mild hazy density at the lower lobes. Upper Abdomen: No acute abnormality Musculoskeletal: No acute or suspicious osseous abnormality Review of the MIP images confirms the above findings. IMPRESSION: 1. Negative for acute pulmonary embolus or aortic dissection 2. Bilateral interstitial thickening, suspect for interstitial lung disease. Scattered hazy bilateral lower lobe densities, probable atelectasis. Aortic Atherosclerosis (ICD10-I70.0). Electronically Signed   By: Donavan Foil M.D.   On: 03/03/2019 22:22   Dg Chest Portable 1 View  Result Date: 03/03/2019 CLINICAL DATA:  Chest pain, stage IV bladder cancer EXAM: PORTABLE CHEST 1 VIEW COMPARISON:  CT chest Nov 24, 2020 FINDINGS: Twenty right IJ approach Port-A-Cath tip terminates in the right atrium. Multiple cardiac monitoring leads overlie the chest. Coarse interstitial opacities are again seen throughout the lungs. No consolidation, features of edema, pneumothorax, or effusion. Pulmonary vascularity is normally distributed. The aorta is calcified and tortuous. The remaining cardiomediastinal contours are unremarkable. No acute osseous or soft  tissue abnormality. Degenerative changes are present in the imaged spine and shoulders. IMPRESSION: 1. Right IJ approach Port-A-Cath tip terminates in the right atrium. 2. Coarse interstitial opacities throughout the lungs, unchanged. 3. No acute cardiopulmonary abnormality. Electronically Signed   By: Lovena Le M.D.   On: 03/03/2019 20:59    Assessment and plan- Patient is a 73 y.o. male with stage IV upper urothelial tract carcinoma currently on third line therapy with Padcev presenting with acute episode of chest pain likely atypical  1.  Cardiac work-up has been unremarkable so far.  CTA did not show any evidence of pulmonary embolism.  Upon acute left-sided chest pain is currently unclear. Padcev should not cause the symptoms either.  He will be undergoing echocardiogram later today.  He may be discharged tomorrow if clinically stable and I will follow-up with him as an outpatient  2. His counts including CBC with differential and CMP are stable.   Thank you for this kind referral and the opportunity to participate in the care of this patient   Visit Diagnosis 1. Chest pain, unspecified type   2. Essential hypertension     Dr. Randa Evens, MD, MPH [facility] at [facility] [id number] 03/04/2019  2:37 PM

## 2019-03-05 ENCOUNTER — Encounter: Payer: Self-pay | Admitting: *Deleted

## 2019-03-05 DIAGNOSIS — E119 Type 2 diabetes mellitus without complications: Secondary | ICD-10-CM | POA: Diagnosis not present

## 2019-03-05 DIAGNOSIS — I10 Essential (primary) hypertension: Secondary | ICD-10-CM | POA: Diagnosis not present

## 2019-03-05 DIAGNOSIS — C679 Malignant neoplasm of bladder, unspecified: Secondary | ICD-10-CM | POA: Diagnosis not present

## 2019-03-05 DIAGNOSIS — R079 Chest pain, unspecified: Secondary | ICD-10-CM | POA: Diagnosis not present

## 2019-03-05 DIAGNOSIS — R071 Chest pain on breathing: Secondary | ICD-10-CM | POA: Diagnosis not present

## 2019-03-05 LAB — BASIC METABOLIC PANEL
Anion gap: 7 (ref 5–15)
BUN: 26 mg/dL — ABNORMAL HIGH (ref 8–23)
CO2: 26 mmol/L (ref 22–32)
Calcium: 9.7 mg/dL (ref 8.9–10.3)
Chloride: 102 mmol/L (ref 98–111)
Creatinine, Ser: 0.64 mg/dL (ref 0.61–1.24)
GFR calc Af Amer: >60 mL/min (ref >60)
GFR calc non Af Amer: >60 mL/min (ref >60)
Glucose, Bld: 145 mg/dL — ABNORMAL HIGH (ref 70–99)
Potassium: 4 mmol/L (ref 3.5–5.1)
Sodium: 135 mmol/L (ref 135–145)

## 2019-03-05 LAB — PROCALCITONIN: Procalcitonin: 1.15 ng/mL

## 2019-03-05 LAB — CBC
HCT: 32.8 % — ABNORMAL LOW (ref 39.0–52.0)
Hemoglobin: 11 g/dL — ABNORMAL LOW (ref 13.0–17.0)
MCH: 28.9 pg (ref 26.0–34.0)
MCHC: 33.5 g/dL (ref 30.0–36.0)
MCV: 86.1 fL (ref 80.0–100.0)
Platelets: 182 10*3/uL (ref 150–400)
RBC: 3.81 MIL/uL — ABNORMAL LOW (ref 4.22–5.81)
RDW: 15.3 % (ref 11.5–15.5)
WBC: 13.3 10*3/uL — ABNORMAL HIGH (ref 4.0–10.5)
nRBC: 0 % (ref 0.0–0.2)

## 2019-03-05 LAB — GLUCOSE, CAPILLARY
Glucose-Capillary: 329 mg/dL — ABNORMAL HIGH (ref 70–99)
Glucose-Capillary: 140 mg/dL — ABNORMAL HIGH (ref 70–99)
Glucose-Capillary: 153 mg/dL — ABNORMAL HIGH (ref 70–99)

## 2019-03-05 MED ORDER — HEPARIN SOD (PORK) LOCK FLUSH 100 UNIT/ML IV SOLN
500.0000 [IU] | INTRAVENOUS | Status: AC | PRN
  Administered 2019-03-05: 12:00:00 500 [IU]

## 2019-03-05 MED ORDER — PREDNISONE 10 MG PO TABS: 40.0000 mg | ORAL_TABLET | Freq: Every day | ORAL | 0 refills | Status: DC

## 2019-03-05 MED ORDER — IBUPROFEN 200 MG PO TABS: 200.0000 mg | ORAL_TABLET | Freq: Four times a day (QID) | ORAL | 0 refills | Status: AC | PRN

## 2019-03-05 MED ORDER — LEVOFLOXACIN 500 MG PO TABS: 500.0000 mg | ORAL_TABLET | Freq: Every day | ORAL | 0 refills | Status: AC

## 2019-03-05 MED ORDER — INSULIN ASPART 100 UNIT/ML ~~LOC~~ SOLN
10.0000 [IU] | Freq: Once | SUBCUTANEOUS | Status: AC
  Administered 2019-03-05: 10 [IU] via SUBCUTANEOUS
  Filled 2019-03-05: qty 1

## 2019-03-05 NOTE — Progress Notes (Signed)
Modena Morrow to be D/C'd Home per MD. Discussed with the patient and all questions fully answered.   VSS, skin clean, dry and intact without evidence of skin break down, no evidence of skin tears noted.  IV catheters discontinued intact. Site without signs and symptoms of complications. Dressing and pressure applied.   An after visit summary was printed and given to the patient. Patient received prescriptions.   D/c education completed with patient/family including follow up instructions, medications list, d/c activities limitations if indicated, with other d/c instructions as indicated by MD- patient able to verbalize understanding, all questions fully answered.   Patient instructed to return to ED, call 911, or Call MD for any changes in condition.  Patient escorted via [facility] and D/C home via private auto.

## 2019-03-05 NOTE — Discharge Summary (Signed)
[name] at [facility], 73 y.o., DOB 1946-02-02, MRN [id number]. Admission date: 03/03/2019 Discharge Date 03/05/2019 Primary MD Jerrol Banana., MD Admitting Physician Harrie Foreman, MD  Admission Diagnosis  Essential hypertension [I10] Chest pain, unspecified type [R07.9]  Discharge Diagnosis   Active Problems:   Chest pain possibly due to underlying interstitial lung disease/musculoskeletal pain Urothelial cancer with mets Coronary artery disease status post MI and stent Hypertension Diabetes type 2 Hyperlipidemia    Hospital Course T patient 73-year of bladder cancer, CAD, hypertension, diabetes and hyperlipidemia presents to the emergency department complaining of chest pain.  The pain began as the patient was walking on a store shopping.  He denies nausea, vomiting or diaphoresis.  In the emergency department the patient continued to have chest pain following application of Nitropaste.  EKG showed no signs of ischemia and initial troponin was negative.  However, due to ongoing chest pain the emergency department staff asked the hospitalist service for further evaluation.  Patient was admitted to the hospital.  His symptoms were more consistent with a musculoskeletal in nature.  Patient was seen by pulmonary they reviewed his CT scan thought that his interstitial lung disease may be contributing to some of his symptoms.  Patient also was started on steroids.  They recommended PCP prophylaxis with Bactrim however patient has allergy.  I will discharge him with follow-up with his pulmonologist to decide if any further treatment needs to be done.             Consults  cardiology, pulmonary  Significant Tests:  See full reports for all details    Ct Angio Chest Pe W And/or Wo Contrast  Result Date: 03/03/2019 CLINICAL DATA:  Chest pain history of bladder cancer EXAM: CT ANGIOGRAPHY CHEST WITH CONTRAST TECHNIQUE:  Multidetector CT imaging of the chest was performed using the standard protocol during bolus administration of intravenous contrast. Multiplanar CT image reconstructions and MIPs were obtained to evaluate the vascular anatomy. CONTRAST:  80mL OMNIPAQUE IOHEXOL 350 MG/ML SOLN COMPARISON:  Chest x-ray 03/03/2019, CT chest 11/25/2018 FINDINGS: Cardiovascular: Satisfactory opacification of the pulmonary arteries to the segmental level. No evidence of pulmonary embolism. Nonaneurysmal aorta. No dissection is seen. Mild aortic atherosclerosis. Coronary vascular calcification. Borderline cardiomegaly. Right-sided central venous port tip in the proximal right atrium. No pericardial effusion Mediastinum/Nodes: Midline trachea. No thyroid mass. Stable 12 mm right precarinal lymph node. Small hiatal hernia. Lungs/Pleura: No consolidation, pleural effusion or pneumothorax. Bilateral septal thickening with mild hazy density at the lower lobes. Upper Abdomen: No acute abnormality Musculoskeletal: No acute or suspicious osseous abnormality Review of the MIP images confirms the above findings. IMPRESSION: 1. Negative for acute pulmonary embolus or aortic dissection 2. Bilateral interstitial thickening, suspect for interstitial lung disease. Scattered hazy bilateral lower lobe densities, probable atelectasis. Aortic Atherosclerosis (ICD10-I70.0). Electronically Signed   By: Donavan Foil M.D.   On: 03/03/2019 22:22   Dg Chest Portable 1 View  Result Date: 03/03/2019 CLINICAL DATA:  Chest pain, stage IV bladder cancer EXAM: PORTABLE CHEST 1 VIEW COMPARISON:  CT chest Nov 24, 2020 FINDINGS: Twenty right IJ approach Port-A-Cath tip terminates in the right atrium. Multiple cardiac monitoring leads overlie the chest. Coarse interstitial opacities are again seen throughout the lungs. No consolidation, features of edema, pneumothorax, or effusion. Pulmonary vascularity is normally distributed. The aorta is calcified and tortuous. The  remaining cardiomediastinal contours are unremarkable. No acute osseous or soft tissue abnormality. Degenerative changes  are present in the imaged spine and shoulders. IMPRESSION: 1. Right IJ approach Port-A-Cath tip terminates in the right atrium. 2. Coarse interstitial opacities throughout the lungs, unchanged. 3. No acute cardiopulmonary abnormality. Electronically Signed   By: Lovena Le M.D.   On: 03/03/2019 20:59       Today   Subjective:   Patrick Mcgee patient doing well denies any chest pain  Objective:   Blood pressure (!) 143/85, pulse (!) 56, temperature 97.8 F (36.6 C), temperature source Oral, resp. rate 19, height 5\' 8"  (1.727 m), weight 71.5 kg, SpO2 99 %.  .  Intake/Output Summary (Last 24 hours) at 03/05/2019 1335 Last data filed at 03/04/2019 1800 Gross per 24 hour  Intake -  Output 350 ml  Net -350 ml    Exam VITAL SIGNS: Blood pressure (!) 143/85, pulse (!) 56, temperature 97.8 F (36.6 C), temperature source Oral, resp. rate 19, height 5\' 8"  (1.727 m), weight 71.5 kg, SpO2 99 %.  GENERAL:  73 y.o.-year-old patient lying in the bed with no acute distress.  EYES: Pupils equal, round, reactive to light and accommodation. No scleral icterus. Extraocular muscles intact.  HEENT: Head atraumatic, normocephalic. Oropharynx and nasopharynx clear.  NECK:  Supple, no jugular venous distention. No thyroid enlargement, no tenderness.  LUNGS: Normal breath sounds bilaterally, no wheezing, rales,rhonchi or crepitation. No use of accessory muscles of respiration.  CARDIOVASCULAR: S1, S2 normal. No murmurs, rubs, or gallops.  ABDOMEN: Soft, nontender, nondistended. Bowel sounds present. No organomegaly or mass.  EXTREMITIES: No pedal edema, cyanosis, or clubbing.  NEUROLOGIC: Cranial nerves II through XII are intact. Muscle strength 5/5 in all extremities. Sensation intact. Gait not checked.  PSYCHIATRIC: The patient is alert and oriented x 3.  SKIN: No obvious rash,  lesion, or ulcer.   Data Review     CBC w Diff:  Lab Results  Component Value Date   WBC 13.3 (H) 03/05/2019   HGB 11.0 (L) 03/05/2019   HGB 13.7 03/15/2017   HCT 32.8 (L) 03/05/2019   HCT 39.7 03/15/2017   PLT 182 03/05/2019   PLT 270 03/15/2017   LYMPHOPCT 14 03/03/2019   MONOPCT 9 03/03/2019   EOSPCT 3 03/03/2019   BASOPCT 0 03/03/2019   CMP:  Lab Results  Component Value Date   NA 135 03/05/2019   NA 137 03/15/2017   NA 137 02/03/2013   K 4.0 03/05/2019   K 4.0 02/03/2013   CL 102 03/05/2019   CL 106 02/03/2013   CO2 26 03/05/2019   CO2 28 02/03/2013   BUN 26 (H) 03/05/2019   BUN 9 03/15/2017   BUN 11 02/03/2013   CREATININE 0.64 03/05/2019   CREATININE 0.69 02/03/2013   GLU 127 09/30/2013   PROT 7.0 03/03/2019   PROT 6.9 03/15/2017   PROT 7.5 02/03/2013   ALBUMIN 3.8 03/03/2019   ALBUMIN 4.3 03/15/2017   ALBUMIN 3.3 (L) 02/03/2013   BILITOT 0.9 03/03/2019   BILITOT 1.5 (H) 03/15/2017   BILITOT 1.1 (H) 02/03/2013   ALKPHOS 70 03/03/2019   ALKPHOS 121 02/03/2013   AST 22 03/03/2019   AST 26 02/03/2013   ALT 20 03/03/2019   ALT 30 02/03/2013  .  Micro Results Recent Results (from the past 240 hour(s))  SARS CORONAVIRUS 2 (TAT 6-12 HRS) Nasal Swab Aptima Multi Swab     Status: None   Collection Time: 03/04/19  1:29 AM   Specimen: Aptima Multi Swab; Nasal Swab  Result Value Ref Range Status  SARS Coronavirus 2 NEGATIVE NEGATIVE Final    Comment: (NOTE) SARS-CoV-2 target nucleic acids are NOT DETECTED. The SARS-CoV-2 RNA is generally detectable in upper and lower respiratory specimens during the acute phase of infection. Negative results do not preclude SARS-CoV-2 infection, do not rule out co-infections with other pathogens, and should not be used as the sole basis for treatment or other patient management decisions. Negative results must be combined with clinical observations, patient history, and epidemiological information. The  expected result is Negative. Fact Sheet for Patients: SugarRoll.be Fact Sheet for Healthcare Providers: https://www.woods-mathews.com/ This test is not yet approved or cleared by the [place] FDA and  has been authorized for detection and/or diagnosis of SARS-CoV-2 by FDA under an Emergency Use Authorization (EUA). This EUA will remain  in effect (meaning this test can be used) for the duration of the COVID-19 declaration under Section 56 4(b)(1) of the Act, 21 U.S.C. section 360bbb-3(b)(1), unless the authorization is terminated or revoked sooner. Performed at [facility], [place] [address] [place], [place] [address]         Code Status Orders  (From admission, onward)         Start     Ordered   03/04/19 0406  Full code  Continuous     03/04/19 0405        Code Status History    Date Active Date Inactive Code Status Order ID Comments User Context   12/17/2017 0916 12/19/2017 2038 Full Code [id number]  Saundra Shelling, MD Inpatient   Advance Care Planning Activity    Advance Directive Documentation     Most Recent Value  Type of Advance Directive  Living will, Healthcare Power of Attorney  Pre-existing out of facility DNR order (yellow form or pink MOST form)  -  "MOST" Form in Place?  -          Follow-up Information    Jerrol Banana., MD Follow up in 6 day(s).   Specialty: Family Medicine Contact information: [address] [place] [address] [phone]        [name], MD Follow up in 6 day(s).   Specialty: Pulmonary Disease Why: hosp f/o ild Contact information: [place] [address] [phone]           Discharge Medications   Allergies as of 03/05/2019      Reactions   Sulfa Antibiotics Other (See Comments)   Joint pain   Ace Inhibitors Cough   Invokana [canagliflozin] Other (See Comments)   Leg pain   Penicillins  Rash   Did it involve swelling of the face/tongue/throat, SOB, or low BP? No Did it involve sudden or severe rash/hives, skin peeling, or any reaction on the inside of your mouth or nose? Yes Did you need to seek medical attention at a hospital or doctor's office? Yes When did it last happen?childhood allergy If all above answers are "NO", may proceed with cephalosporin use.      Medication List    TAKE these medications   aspirin EC 81 MG tablet Take 81 mg by mouth every evening. Notes to patient: Next dose 03/05/19   atorvastatin 40 MG tablet Commonly known as: LIPITOR Take 1 tablet (40 mg total) by mouth at bedtime. Notes to patient: next dose 03/05/19   carvedilol 12.5 MG tablet Commonly known as: COREG TAKE 1 TABLET BY MOUTH TWICE DAILY What changed: when to take this   clobetasol cream  0.05 % Commonly known as: TEMOVATE Apply 1 application topically as needed (for skin rash).   fluticasone 50 MCG/ACT nasal spray Commonly known as: FLONASE Use 2 spray(s) in each nostril once daily What changed: See the new instructions.   gabapentin 300 MG capsule Commonly known as: NEURONTIN Take 1 capsule (300 mg total) by mouth 3 (three) times daily. Notes to patient: Next dose 03/05/19 (patient adjusted his own dose)   glucose blood test strip Commonly known as: Contour Next Test Check blood sugars 3 times daily.   hydrOXYzine 25 MG tablet Commonly known as: ATARAX/VISTARIL Take 1 tablet (25 mg total) by mouth every 8 (eight) hours as needed for itching.   ibuprofen 200 MG tablet Commonly known as: Motrin IB Take 1 tablet (200 mg total) by mouth every 6 (six) hours as needed for up to 4 days.   Jardiance 10 MG Tabs tablet Generic drug: empagliflozin Take 10 mg by mouth daily. What changed:   when to take this  reasons to take this   levofloxacin 500 MG tablet Commonly known as: Levaquin Take 1 tablet (500 mg total) by mouth daily for 5 days. Notes to patient:  03/05/19   lidocaine-prilocaine cream Commonly known as: EMLA Apply 1 application topically as needed (port access).   loratadine 10 MG tablet Commonly known as: CLARITIN Take 10 mg by mouth daily as needed for allergies.   losartan 100 MG tablet Commonly known as: COZAAR TAKE 1 TABLET BY MOUTH ONCE DAILY What changed: when to take this Notes to patient: Next dose 03/05/19   magnesium hydroxide 400 MG/5ML suspension Commonly known as: MILK OF MAGNESIA Take 15 mLs by mouth daily as needed for mild constipation.   metFORMIN 1000 MG tablet Commonly known as: GLUCOPHAGE TAKE 1 TABLET BY MOUTH TWICE DAILY WITH MEALS What changed: when to take this Notes to patient: Next dose 03/05/19   nystatin cream Commonly known as: MYCOSTATIN Apply topically 2 (two) times daily. What changed:   how much to take  when to take this  reasons to take this   OVER THE COUNTER MEDICATION Apply 1 application topically daily as needed (pain). Outback topical pain relief   predniSONE 10 MG tablet Commonly known as: DELTASONE Take 4 tablets (40 mg total) by mouth daily.   sertraline 100 MG tablet Commonly known as: ZOLOFT Take 1 tablet (100 mg total) by mouth daily.   Soolantra 1 % Crea Generic drug: Ivermectin Apply 1 application topically daily as needed (rosacea). To face   vitamin B-12 1000 MCG tablet Commonly known as: CYANOCOBALAMIN Take 1,000 mcg by mouth daily. Notes to patient: Next dose 03/05/19   vitamin C 500 MG tablet Commonly known as: ASCORBIC ACID Take 1,000-1,500 mg by mouth daily as needed (immune support).          Total Time in preparing paper work, data evaluation and todays exam - 53 minutes  Dustin Flock M.D on 03/05/2019 at [facility]  [phone]

## 2019-03-05 NOTE — Discharge Instructions (Signed)
Chest Wall Pain Chest wall pain is pain in or around the bones and muscles of your chest. Chest wall pain may be caused by:  An injury.  Coughing a lot.  Using your chest and arm muscles too much. Sometimes, the cause may not be known. This pain may take a few weeks or longer to get better. Follow these instructions at home: Managing pain, stiffness, and swelling If told, put ice on the painful area:  Put ice in a plastic bag.  Place a towel between your skin and the bag.  Leave the ice on for 20 minutes, 2-3 times a day.  Activity  Rest as told by your doctor.  Avoid doing things that cause pain. This includes lifting heavy items.  Ask your doctor what activities are safe for you. General instructions   Take over-the-counter and prescription medicines only as told by your doctor.  Do not use any products that contain nicotine or tobacco, such as cigarettes, e-cigarettes, and chewing tobacco. If you need help quitting, ask your doctor.  Keep all follow-up visits as told by your doctor. This is important. Contact a doctor if:  You have a fever.  Your chest pain gets worse.  You have new symptoms. Get help right away if:  You feel sick to your stomach (nauseous) or you throw up (vomit).  You feel sweaty or light-headed.  You have a cough with mucus from your lungs (sputum) or you cough up blood.  You are short of breath. These symptoms may be an emergency. Do not wait to see if the symptoms will go away. Get medical help right away. Call your local emergency services (911 in the [place] Do not drive yourself to the hospital. Summary  Chest wall pain is pain in or around the bones and muscles of your chest.  It may be treated with ice, rest, and medicines. Your condition may also get better if you avoid doing things that cause pain.  Contact a doctor if you have a fever, chest pain that gets worse, or new symptoms.  Get help right away if you feel light-headed  or you get short of breath. These symptoms may be an emergency. This information is not intended to replace advice given to you by your health care provider. Make sure you discuss any questions you have with your health care provider. Document Released: 12/13/2007 Document Revised: 12/27/2017 Document Reviewed: 12/27/2017 Elsevier Patient Education  2020 [employer].   Hypertension, Adult Hypertension is another name for high blood pressure. High blood pressure forces your heart to work harder to pump blood. This can cause problems over time. There are two numbers in a blood pressure reading. There is a top number (systolic) over a bottom number (diastolic). It is best to have a blood pressure that is below 120/80. Healthy choices can help lower your blood pressure, or you may need medicine to help lower it. What are the causes? The cause of this condition is not known. Some conditions may be related to high blood pressure. What increases the risk?  Smoking.  Having type 2 diabetes mellitus, high cholesterol, or both.  Not getting enough exercise or physical activity.  Being overweight.  Having too much fat, sugar, calories, or salt (sodium) in your diet.  Drinking too much alcohol.  Having long-term (chronic) kidney disease.  Having a family history of high blood pressure.  Age. Risk increases with age.  Race. You may be at higher risk if you are African American.  Gender. Men are at higher risk than women before age 66. After age 14, women are at higher risk than men.  Having obstructive sleep apnea.  Stress. What are the signs or symptoms?  High blood pressure may not cause symptoms. Very high blood pressure (hypertensive crisis) may cause: ? Headache. ? Feelings of worry or nervousness (anxiety). ? Shortness of breath. ? Nosebleed. ? A feeling of being sick to your stomach (nausea). ? Throwing up (vomiting). ? Changes in how you see. ? Very bad chest  pain. ? Seizures. How is this treated?  This condition is treated by making healthy lifestyle changes, such as: ? Eating healthy foods. ? Exercising more. ? Drinking less alcohol.  Your health care provider may prescribe medicine if lifestyle changes are not enough to get your blood pressure under control, and if: ? Your top number is above 130. ? Your bottom number is above 80.  Your personal target blood pressure may vary. Follow these instructions at home: Eating and drinking   If told, follow the DASH eating plan. To follow this plan: ? Fill one half of your plate at each meal with fruits and vegetables. ? Fill one fourth of your plate at each meal with whole grains. Whole grains include whole-wheat pasta, brown rice, and whole-grain bread. ? Eat or drink low-fat dairy products, such as skim milk or low-fat yogurt. ? Fill one fourth of your plate at each meal with low-fat (lean) proteins. Low-fat proteins include fish, chicken without skin, eggs, beans, and tofu. ? Avoid fatty meat, cured and processed meat, or chicken with skin. ? Avoid pre-made or processed food.  Eat less than 1,500 mg of salt each day.  Do not drink alcohol if: ? Your doctor tells you not to drink. ? You are pregnant, may be pregnant, or are planning to become pregnant.  If you drink alcohol: ? Limit how much you use to:  0-1 drink a day for women.  0-2 drinks a day for men. ? Be aware of how much alcohol is in your drink. In the [place] one drink equals one 12 oz bottle of beer (355 mL), one 5 oz glass of wine (148 mL), or one 1 oz glass of hard liquor (44 mL). Lifestyle   Work with your doctor to stay at a healthy weight or to lose weight. Ask your doctor what the best weight is for you.  Get at least 30 minutes of exercise most days of the week. This may include walking, swimming, or biking.  Get at least 30 minutes of exercise that strengthens your muscles (resistance exercise) at least 3 days  a week. This may include lifting weights or doing Pilates.  Do not use any products that contain nicotine or tobacco, such as cigarettes, e-cigarettes, and chewing tobacco. If you need help quitting, ask your doctor.  Check your blood pressure at home as told by your doctor.  Keep all follow-up visits as told by your doctor. This is important. Medicines  Take over-the-counter and prescription medicines only as told by your doctor. Follow directions carefully.  Do not skip doses of blood pressure medicine. The medicine does not work as well if you skip doses. Skipping doses also puts you at risk for problems.  Ask your doctor about side effects or reactions to medicines that you should watch for. Contact a doctor if you:  Think you are having a reaction to the medicine you are taking.  Have headaches that keep coming back (recurring).  Feel dizzy.  Have swelling in your ankles.  Have trouble with your vision. Get help right away if you:  Get a very bad headache.  Start to feel mixed up (confused).  Feel weak or numb.  Feel faint.  Have very bad pain in your: ? Chest. ? Belly (abdomen).  Throw up more than once.  Have trouble breathing. Summary  Hypertension is another name for high blood pressure.  High blood pressure forces your heart to work harder to pump blood.  For most people, a normal blood pressure is less than 120/80.  Making healthy choices can help lower blood pressure. If your blood pressure does not get lower with healthy choices, you may need to take medicine. This information is not intended to replace advice given to you by your health care provider. Make sure you discuss any questions you have with your health care provider. Document Released: 12/13/2007 Document Revised: 03/06/2018 Document Reviewed: 03/06/2018 Elsevier Patient Education  2020 [employer].   Nonspecific Chest Pain Chest pain can be caused by many different conditions. Some  causes of chest pain can be life-threatening. These will require treatment right away. Serious causes of chest pain include:  Heart attack.  A tear in the body's main blood vessel.  Redness and swelling (inflammation) around your heart.  Blood clot in your lungs. Other causes of chest pain may not be so serious. These include:  Heartburn.  Anxiety or stress.  Damage to bones or muscles in your chest.  Lung infections. Chest pain can feel like:  Pain or discomfort in your chest.  Crushing, pressure, aching, or squeezing pain.  Burning or tingling.  Dull or sharp pain that is worse when you move, cough, or take a deep breath.  Pain or discomfort that is also felt in your back, neck, jaw, shoulder, or arm, or pain that spreads to any of these areas. It is hard to know whether your pain is caused by something that is serious or something that is not so serious. So it is important to see your doctor right away if you have chest pain. Follow these instructions at home: Medicines  Take over-the-counter and prescription medicines only as told by your doctor.  If you were prescribed an antibiotic medicine, take it as told by your doctor. Do not stop taking the antibiotic even if you start to feel better. Lifestyle   Rest as told by your doctor.  Do not use any products that contain nicotine or tobacco, such as cigarettes, e-cigarettes, and chewing tobacco. If you need help quitting, ask your doctor.  Do not drink alcohol.  Make lifestyle changes as told by your doctor. These may include: ? Getting regular exercise. Ask your doctor what activities are safe for you. ? Eating a heart-healthy diet. A diet and nutrition specialist (dietitian) can help you to learn healthy eating options. ? Staying at a healthy weight. ? Treating diabetes or high blood pressure, if needed. ? Lowering your stress. Activities such as yoga and relaxation techniques can help. General  instructions  Pay attention to any changes in your symptoms. Tell your doctor about them or any new symptoms.  Avoid any activities that cause chest pain.  Keep all follow-up visits as told by your doctor. This is important. You may need more testing if your chest pain does not go away. Contact a doctor if:  Your chest pain does not go away.  You feel depressed.  You have a fever. Get help right away  if:  Your chest pain is worse.  You have a cough that gets worse, or you cough up blood.  You have very bad (severe) pain in your belly (abdomen).  You pass out (faint).  You have either of these for no clear reason: ? Sudden chest discomfort. ? Sudden discomfort in your arms, back, neck, or jaw.  You have shortness of breath at any time.  You suddenly start to sweat, or your skin gets clammy.  You feel sick to your stomach (nauseous).  You throw up (vomit).  You suddenly feel lightheaded or dizzy.  You feel very weak or tired.  Your heart starts to beat fast, or it feels like it is skipping beats. These symptoms may be an emergency. Do not wait to see if the symptoms will go away. Get medical help right away. Call your local emergency services (911 in the [place] Do not drive yourself to the hospital. Summary  Chest pain can be caused by many different conditions. The cause may be serious and need treatment right away. If you have chest pain, see your doctor right away.  Follow your doctor's instructions for taking medicines and making lifestyle changes.  Keep all follow-up visits as told by your doctor. This includes visits for any further testing if your chest pain does not go away.  Be sure to know the signs that show that your condition has become worse. Get help right away if you have these symptoms. This information is not intended to replace advice given to you by your health care provider. Make sure you discuss any questions you have with your health care  provider. Document Released: 12/13/2007 Document Revised: 12/27/2017 Document Reviewed: 12/27/2017 Elsevier Patient Education  2020 [employer].

## 2019-03-05 NOTE — Care Management Obs Status (Signed)
MEDICARE OBSERVATION STATUS NOTIFICATION   Patient Details  Name: Patrick Mcgee MRN: [id number] Date of Birth: April 04, 1946   Medicare Observation Status Notification Given:  Yes    Candie Chroman, LCSW 03/05/2019, 9:16 AM

## 2019-03-05 NOTE — Progress Notes (Signed)
CRITICAL CARE NOTE      CHIEF COMPLAINT:   Chest pain.    SUBJECTIVE    Patient is improved clinically. Plan today is to optimize for downgrade to medical floor.   PAST MEDICAL HISTORY   Past Medical History:  Diagnosis Date  . Acid reflux   . Anxiety   . Depression   . Diabetes mellitus without complication ([place])   . Hyperlipidemia   . Hypertension   . MRSA (methicillin resistant Staphylococcus aureus) infection 1992   history  . Myocardial infarction ([place]) 1995  . Sleep apnea    CPAP  . 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 CATARACT EXTRACTION Left   . COLONOSCOPY  2010   [facility]  . COLONOSCOPY WITH PROPOFOL N/A 10/04/2016   Procedure: COLONOSCOPY WITH PROPOFOL;  Surgeon: [name], MD;  Location: [facility] ENDOSCOPY;  Service: Endoscopy;  Laterality: N/A;  . [place], 2000, 2001, 2014  . CYSTOSCOPY W/ RETROGRADES Bilateral 12/10/2017   Procedure: CYSTOSCOPY WITH RETROGRADE PYELOGRAM;  Surgeon: [name], MD;  Location: [facility] ORS;  Service: Urology;  Laterality: Bilateral;  . CYSTOSCOPY W/ RETROGRADES Left 12/09/2018   Procedure: CYSTOSCOPY WITH RETROGRADE PYELOGRAM;  Surgeon: [name], MD;  Location: [facility] ORS;  Service: Urology;  Laterality: Left;  . CYSTOSCOPY WITH BIOPSY Left 12/09/2018   Procedure: CYSTOSCOPY WITH URETERAL/RENAL PELVIC BIOPSY;  Surgeon: [name], MD;  Location: [facility] ORS;  Service: Urology;  Laterality: Left;  . CYSTOSCOPY WITH STENT PLACEMENT Left 12/10/2017   Procedure: CYSTOSCOPY WITH STENT PLACEMENT;  Surgeon: [name], MD;  Location: [facility] ORS;  Service: Urology;  Laterality: Left;  . CYSTOSCOPY WITH  STENT PLACEMENT Left 12/09/2018   Procedure: CYSTOSCOPY WITH STENT PLACEMENT;  Surgeon: [name], MD;  Location: [facility] ORS;  Service: Urology;  Laterality: Left;  . CYSTOSCOPY WITH URETEROSCOPY Left 12/09/2018   Procedure: CYSTOSCOPY WITH URETEROSCOPY;  Surgeon: [name], MD;  Location: [facility] ORS;  Service: Urology;  Laterality: Left;  . EYE SURGERY Bilateral    cataract  . INGUINAL HERNIA REPAIR Right 08/09/2015   Procedure: HERNIA REPAIR INGUINAL ADULT;  Surgeon: [name], MD;  Location: [facility] ORS;  Service: General;  Laterality: Right;  . NASAL SINUS SURGERY    . nuclear stress test    . PORTA CATH INSERTION N/A 12/19/2017   Procedure: PORTA CATH INSERTION;  Surgeon: [name], MD;  Location: [place] [facility] CV LAB;  Service: Cardiovascular;  Laterality: N/A;  . URETERAL BIOPSY Left 12/10/2017   Procedure: URETERAL & renal PELVIS BIOPSY;  Surgeon: [name], MD;  Location: [facility] ORS;  Service: Urology;  Laterality: Left;  . URETEROSCOPY Left 12/10/2017   Procedure: URETEROSCOPY;  Surgeon: [name], MD;  Location: [facility] ORS;  Service: Urology;  Laterality: Left;     FAMILY HISTORY   Family History  Problem Relation Age of Onset  . Heart disease Mother   . Cancer Father        Lung and colon cancer  . Heart disease Father   . Emphysema Maternal Grandfather   . Tuberculosis Maternal Grandmother      SOCIAL HISTORY   Social History   Tobacco Use  . Smoking status: Former Smoker    Types: Cigars    Quit date: 10/09/1978    Years since quitting: 40.4  . Smokeless tobacco: Former  User    Types: Chew    Quit date: 12/04/1988  . Tobacco comment: on occasion  Substance Use Topics  . Alcohol use: Not Currently  . Drug use: No     MEDICATIONS   Current Medication:  Current Facility-Administered Medications:  .  acetaminophen (TYLENOL) tablet 650 mg, 650 mg, Oral, Q6H PRN **OR** acetaminophen (TYLENOL) suppository 650 mg, 650 mg, Rectal, Q6H PRN,  Harrie Foreman, MD .  aspirin EC tablet 81 mg, 81 mg, Oral, QPM, Harrie Foreman, MD, 81 mg at 03/04/19 1758 .  atorvastatin (LIPITOR) tablet 40 mg, 40 mg, Oral, QHS, Harrie Foreman, MD, 40 mg at 03/04/19 2141 .  carvedilol (COREG) tablet 12.5 mg, 12.5 mg, Oral, BID, Harrie Foreman, MD, 12.5 mg at 03/04/19 2141 .  Chlorhexidine Gluconate Cloth 2 % PADS 6 each, 6 each, Topical, Q0600, Harrie Foreman, MD, 6 each at 03/04/19 [phone] .  clobetasol cream (TEMOVATE) [id number] % 1 application, 1 application, Topical, PRN, Harrie Foreman, MD .  docusate sodium (COLACE) capsule 100 mg, 100 mg, Oral, BID, Harrie Foreman, MD, 100 mg at 03/04/19 2147 .  enoxaparin (LOVENOX) injection 40 mg, 40 mg, Subcutaneous, Q24H, Harrie Foreman, MD, 40 mg at 03/05/19 0500 .  fluticasone (FLONASE) 50 MCG/ACT nasal spray 2 spray, 2 spray, Each Nare, Daily PRN, Harrie Foreman, MD .  gabapentin (NEURONTIN) capsule 300 mg, 300 mg, Oral, TID, Harrie Foreman, MD, 300 mg at 03/04/19 2141 .  hydrOXYzine (ATARAX/VISTARIL) tablet 25 mg, 25 mg, Oral, Q8H PRN, Harrie Foreman, MD .  insulin aspart (novoLOG) injection 0-20 Units, 0-20 Units, Subcutaneous, TID WC, Darel Hong D, NP .  insulin aspart (novoLOG) injection 0-5 Units, 0-5 Units, Subcutaneous, QHS, Harrie Foreman, MD, 5 Units at 03/04/19 2142 .  ketorolac (TORADOL) 15 MG/ML injection 15 mg, 15 mg, Intravenous, Q6H, Dustin Flock, MD, 15 mg at 03/05/19 0555 .  loratadine (CLARITIN) tablet 10 mg, 10 mg, Oral, Daily PRN, Harrie Foreman, MD .  losartan (COZAAR) tablet 100 mg, 100 mg, Oral, Q2000, Dustin Flock, MD, 100 mg at 03/04/19 2055 .  magnesium hydroxide (MILK OF MAGNESIA) suspension 15 mL, 15 mL, Oral, Daily PRN, Harrie Foreman, MD .  methylPREDNISolone sodium succinate (SOLU-MEDROL) 125 mg/2 mL injection 60 mg, 60 mg, Intravenous, Q12H, Ottie Glazier, MD, 60 mg at 03/05/19 0008 .  nitroGLYCERIN (NITROSTAT) SL tablet  0.4 mg, 0.4 mg, Sublingual, Q5 min PRN, Dustin Flock, MD .  nystatin cream (MYCOSTATIN), , Topical, BID PRN, Dustin Flock, MD .  ondansetron (ZOFRAN) tablet 4 mg, 4 mg, Oral, Q6H PRN **OR** ondansetron (ZOFRAN) injection 4 mg, 4 mg, Intravenous, Q6H PRN, Harrie Foreman, MD .  sertraline (ZOLOFT) tablet 100 mg, 100 mg, Oral, Daily, Harrie Foreman, MD, 100 mg at 03/04/19 [id number] .  vitamin B-12 (CYANOCOBALAMIN) tablet 1,000 mcg, 1,000 mcg, Oral, Daily, Harrie Foreman, MD, 1,000 mcg at 03/04/19 [phone] .  vitamin C (ASCORBIC ACID) tablet 1,000-1,500 mg, 1,000-1,500 mg, Oral, Daily PRN, Harrie Foreman, MD  Facility-Administered Medications Ordered in Other Encounters:  .  Influenza vac split quadrivalent PF (FLUZONE HIGH-DOSE) injection 0.5 mL, 0.5 mL, Intramuscular, Once, Karen Kitchens, NP    ALLERGIES   Sulfa antibiotics, Ace inhibitors, Invokana [canagliflozin], and Penicillins    REVIEW OF SYSTEMS     10 point ROS negative except as per HPI  PHYSICAL EXAMINATION   Vitals:   03/05/19 0700 03/05/19 0800  BP: Marland Kitchen)  158/85 (!) 177/95  Pulse: (!) 55 (!) 59  Resp: 17 16  Temp:  97.8 F (36.6 C)  SpO2: 96% 97%    GENERAL Mild distress due to chest discomfort with deep inspiratoin HEAD: Normocephalic, atraumatic.  EYES: Pupils equal, round, reactive to light.  No scleral icterus.  MOUTH: Moist mucosal membrane. NECK: Supple. No thyromegaly. No nodules. No JVD.  PULMONARY: crackles at bases bilaterally  CARDIOVASCULAR: S1 and S2. Regular rate and rhythm. No murmurs, rubs, or gallops.  GASTROINTESTINAL: Soft, nontender, non-distended. No masses. Positive bowel sounds. No hepatosplenomegaly.  MUSCULOSKELETAL: No swelling, clubbing, or edema.  NEUROLOGIC: Mild distress due to acute illness SKIN:intact,warm,dry   PERTINENT DATA     Infusions:  Scheduled Medications: . aspirin EC  81 mg Oral QPM  . atorvastatin  40 mg Oral QHS  . carvedilol  12.5 mg Oral BID   . Chlorhexidine Gluconate Cloth  6 each Topical Q0600  . docusate sodium  100 mg Oral BID  . enoxaparin (LOVENOX) injection  40 mg Subcutaneous Q24H  . gabapentin  300 mg Oral TID  . insulin aspart  0-20 Units Subcutaneous TID WC  . insulin aspart  0-5 Units Subcutaneous QHS  . ketorolac  15 mg Intravenous Q6H  . losartan  100 mg Oral Q2000  . methylPREDNISolone (SOLU-MEDROL) injection  60 mg Intravenous Q12H  . sertraline  100 mg Oral Daily  . vitamin B-12  1,000 mcg Oral Daily   PRN Medications: acetaminophen **OR** acetaminophen, clobetasol cream, fluticasone, hydrOXYzine, loratadine, magnesium hydroxide, nitroGLYCERIN, nystatin cream, ondansetron **OR** ondansetron (ZOFRAN) IV, vitamin C Hemodynamic parameters:   Intake/Output: 08/25 0701 - 08/26 0700 In: 240 [P.O.:240] Out: 675 [Urine:675]  Ventilator  Settings:       LAB RESULTS:  Basic Metabolic Panel: Recent Labs  Lab 03/03/19 2058 03/05/19 0557  NA 135 135  K 4.2 4.0  CL 101 102  CO2 24 26  GLUCOSE 172* 145*  BUN 15 26*  CREATININE 0.52* 0.64  CALCIUM 9.3 9.7   Liver Function Tests: Recent Labs  Lab 03/03/19 2058  AST 22  ALT 20  ALKPHOS 70  BILITOT 0.9  PROT 7.0  ALBUMIN 3.8   No results for input(s): LIPASE, AMYLASE in the last 168 hours. No results for input(s): AMMONIA in the last 168 hours. CBC: Recent Labs  Lab 03/03/19 2058 03/05/19 0557  WBC 9.9 13.3*  NEUTROABS 7.3  --   HGB 11.5* 11.0*  HCT 35.1* 32.8*  MCV 88.4 86.1  PLT 206 182   Cardiac Enzymes: No results for input(s): CKTOTAL, CKMB, CKMBINDEX, TROPONINI in the last 168 hours. BNP: Invalid input(s): POCBNP CBG: Recent Labs  Lab 03/04/19 1539 03/04/19 2129 03/05/19 0005 03/05/19 0558 03/05/19 0713  GLUCAP 235* 424* 329* 140* 153*     IMAGING RESULTS:  Imaging: Ct Angio Chest Pe W And/or Wo Contrast  Result Date: 03/03/2019 CLINICAL DATA:  Chest pain history of bladder cancer EXAM: CT ANGIOGRAPHY CHEST WITH  CONTRAST TECHNIQUE: Multidetector CT imaging of the chest was performed using the standard protocol during bolus administration of intravenous contrast. Multiplanar CT image reconstructions and MIPs were obtained to evaluate the vascular anatomy. CONTRAST:  8mL OMNIPAQUE IOHEXOL 350 MG/ML SOLN COMPARISON:  Chest x-ray 03/03/2019, CT chest 11/25/2018 FINDINGS: Cardiovascular: Satisfactory opacification of the pulmonary arteries to the segmental level. No evidence of pulmonary embolism. Nonaneurysmal aorta. No dissection is seen. Mild aortic atherosclerosis. Coronary vascular calcification. Borderline cardiomegaly. Right-sided central venous port tip in the proximal right atrium.  No pericardial effusion Mediastinum/Nodes: Midline trachea. No thyroid mass. Stable 12 mm right precarinal lymph node. Small hiatal hernia. Lungs/Pleura: No consolidation, pleural effusion or pneumothorax. Bilateral septal thickening with mild hazy density at the lower lobes. Upper Abdomen: No acute abnormality Musculoskeletal: No acute or suspicious osseous abnormality Review of the MIP images confirms the above findings. IMPRESSION: 1. Negative for acute pulmonary embolus or aortic dissection 2. Bilateral interstitial thickening, suspect for interstitial lung disease. Scattered hazy bilateral lower lobe densities, probable atelectasis. Aortic Atherosclerosis (ICD10-I70.0). Electronically Signed   By: Donavan Foil M.D.   On: 03/03/2019 22:22   Dg Chest Portable 1 View  Result Date: 03/03/2019 CLINICAL DATA:  Chest pain, stage IV bladder cancer EXAM: PORTABLE CHEST 1 VIEW COMPARISON:  CT chest Nov 24, 2020 FINDINGS: Twenty right IJ approach Port-A-Cath tip terminates in the right atrium. Multiple cardiac monitoring leads overlie the chest. Coarse interstitial opacities are again seen throughout the lungs. No consolidation, features of edema, pneumothorax, or effusion. Pulmonary vascularity is normally distributed. The aorta is calcified  and tortuous. The remaining cardiomediastinal contours are unremarkable. No acute osseous or soft tissue abnormality. Degenerative changes are present in the imaged spine and shoulders. IMPRESSION: 1. Right IJ approach Port-A-Cath tip terminates in the right atrium. 2. Coarse interstitial opacities throughout the lungs, unchanged. 3. No acute cardiopulmonary abnormality. Electronically Signed   By: Lovena Le M.D.   On: 03/03/2019 20:59         ASSESSMENT AND PLAN    -Multidisciplinary rounds held today  Acute substernal chest pain    - likely related to underlying interstitial basal predominant bilateral ground glass infiltrates.     - patient has been on weekly chemo including immunotherapyTecentriq which is known to induce pneumonitis and intestitial lung disease.  - would rule out infectious possibilities and cardiac involvement and then use prolonged steroids as previous with bactrim ppx on outpatient basis for worsening pnemonitis and pleuritis.  -PFT consistent with ILD  - patient will follow up with primary pulmonologist Dr Ashby Dawes   Urothelial cancer with metastasis  - chronic stable   - followed by Dr Janece Canterbury and Dr Janese Banks   CAD s/p MI and stenting  cnronic stable  - cardiology on case appreciate input - status post inferior STEMI 12/95, stent RCA, stent LAD 2/99, stent RCA 09/14/1998, and stent mid LAD 10/22/2012.    ID -continue IV abx as prescibed -follow up cultures  GI/Nutrition GI PROPHYLAXIS as indicated DIET-->TF's as tolerated Constipation protocol as indicated  ENDO - ICU hypoglycemic\Hyperglycemia protocol -check FSBS per protocol   ELECTROLYTES -follow labs as needed -replace as needed -pharmacy consultation   DVT/GI PRX ordered -SCDs  TRANSFUSIONS AS NEEDED MONITOR FSBS ASSESS the need for LABS as needed   This document was prepared using Dragon voice recognition software and may include unintentional dictation errors.    Ottie Glazier, M.D.  Division of [facility] [place]

## 2019-03-11 ENCOUNTER — Inpatient Hospital Stay: Payer: Medicare Other

## 2019-03-11 ENCOUNTER — Inpatient Hospital Stay: Payer: Medicare Other | Attending: Oncology

## 2019-03-11 ENCOUNTER — Other Ambulatory Visit: Payer: Medicare Other

## 2019-03-11 ENCOUNTER — Telehealth: Payer: Self-pay | Admitting: *Deleted

## 2019-03-11 ENCOUNTER — Encounter: Payer: Self-pay | Admitting: Oncology

## 2019-03-11 ENCOUNTER — Inpatient Hospital Stay (HOSPITAL_BASED_OUTPATIENT_CLINIC_OR_DEPARTMENT_OTHER): Payer: Medicare Other | Admitting: Oncology

## 2019-03-11 ENCOUNTER — Ambulatory Visit: Payer: Medicare Other | Admitting: Oncology

## 2019-03-11 ENCOUNTER — Other Ambulatory Visit: Payer: Self-pay

## 2019-03-11 ENCOUNTER — Ambulatory Visit: Payer: Medicare Other

## 2019-03-11 VITALS — BP 117/76 | HR 63 | Temp 98.3°F | Resp 16 | Ht 68.0 in | Wt 153.8 lb

## 2019-03-11 DIAGNOSIS — C642 Malignant neoplasm of left kidney, except renal pelvis: Secondary | ICD-10-CM

## 2019-03-11 DIAGNOSIS — Z5111 Encounter for antineoplastic chemotherapy: Secondary | ICD-10-CM

## 2019-03-11 DIAGNOSIS — Z5112 Encounter for antineoplastic immunotherapy: Secondary | ICD-10-CM | POA: Diagnosis not present

## 2019-03-11 DIAGNOSIS — D701 Agranulocytosis secondary to cancer chemotherapy: Secondary | ICD-10-CM

## 2019-03-11 DIAGNOSIS — T451X5A Adverse effect of antineoplastic and immunosuppressive drugs, initial encounter: Secondary | ICD-10-CM

## 2019-03-11 DIAGNOSIS — C652 Malignant neoplasm of left renal pelvis: Secondary | ICD-10-CM | POA: Diagnosis not present

## 2019-03-11 DIAGNOSIS — I25119 Atherosclerotic heart disease of native coronary artery with unspecified angina pectoris: Secondary | ICD-10-CM | POA: Diagnosis not present

## 2019-03-11 DIAGNOSIS — Z95828 Presence of other vascular implants and grafts: Secondary | ICD-10-CM

## 2019-03-11 DIAGNOSIS — G620 Drug-induced polyneuropathy: Secondary | ICD-10-CM | POA: Diagnosis not present

## 2019-03-11 DIAGNOSIS — C772 Secondary and unspecified malignant neoplasm of intra-abdominal lymph nodes: Secondary | ICD-10-CM | POA: Insufficient documentation

## 2019-03-11 LAB — COMPREHENSIVE METABOLIC PANEL
ALT: 15 U/L (ref 0–44)
AST: 16 U/L (ref 15–41)
Albumin: 3.5 g/dL (ref 3.5–5.0)
Alkaline Phosphatase: 60 U/L (ref 38–126)
Anion gap: 8 (ref 5–15)
BUN: 15 mg/dL (ref 8–23)
CO2: 26 mmol/L (ref 22–32)
Calcium: 9.3 mg/dL (ref 8.9–10.3)
Chloride: 102 mmol/L (ref 98–111)
Creatinine, Ser: 0.8 mg/dL (ref 0.61–1.24)
GFR calc Af Amer: >60 mL/min (ref >60)
GFR calc non Af Amer: >60 mL/min (ref >60)
Glucose, Bld: 251 mg/dL — ABNORMAL HIGH (ref 70–99)
Potassium: 4.2 mmol/L (ref 3.5–5.1)
Sodium: 136 mmol/L (ref 135–145)
Total Bilirubin: 1.1 mg/dL (ref 0.3–1.2)
Total Protein: 6.8 g/dL (ref 6.5–8.1)

## 2019-03-11 LAB — CBC WITH DIFFERENTIAL/PLATELET
Abs Immature Granulocytes: 0.03 10*3/uL (ref 0.00–0.07)
Basophils Absolute: 0 10*3/uL (ref 0.0–0.1)
Basophils Relative: 1 %
Eosinophils Absolute: 0.4 10*3/uL (ref 0.0–0.5)
Eosinophils Relative: 5 %
HCT: 33.9 % — ABNORMAL LOW (ref 39.0–52.0)
Hemoglobin: 11.1 g/dL — ABNORMAL LOW (ref 13.0–17.0)
Immature Granulocytes: 0 %
Lymphocytes Relative: 16 %
Lymphs Abs: 1.2 10*3/uL (ref 0.7–4.0)
MCH: 28.8 pg (ref 26.0–34.0)
MCHC: 32.7 g/dL (ref 30.0–36.0)
MCV: 87.8 fL (ref 80.0–100.0)
Monocytes Absolute: 0.6 10*3/uL (ref 0.1–1.0)
Monocytes Relative: 8 %
Neutro Abs: 5.5 10*3/uL (ref 1.7–7.7)
Neutrophils Relative %: 70 %
Platelets: 235 10*3/uL (ref 150–400)
RBC: 3.86 MIL/uL — ABNORMAL LOW (ref 4.22–5.81)
RDW: 15.9 % — ABNORMAL HIGH (ref 11.5–15.5)
WBC: 7.8 10*3/uL (ref 4.0–10.5)
nRBC: 0 % (ref 0.0–0.2)

## 2019-03-11 MED ORDER — SODIUM CHLORIDE 0.9 % IV SOLN
1.0000 mg/kg | Freq: Once | INTRAVENOUS | Status: AC
  Administered 2019-03-11: 70 mg via INTRAVENOUS
  Filled 2019-03-11: qty 7

## 2019-03-11 MED ORDER — SODIUM CHLORIDE 0.9 % IV SOLN
Freq: Once | INTRAVENOUS | Status: AC
  Administered 2019-03-11: 10:00:00 via INTRAVENOUS
  Filled 2019-03-11: qty 250

## 2019-03-11 MED ORDER — HEPARIN SOD (PORK) LOCK FLUSH 100 UNIT/ML IV SOLN
500.0000 [IU] | Freq: Once | INTRAVENOUS | Status: AC | PRN
  Administered 2019-03-11: 11:00:00 500 [IU]
  Filled 2019-03-11: qty 5

## 2019-03-11 MED ORDER — PALONOSETRON HCL INJECTION 0.25 MG/5ML
0.2500 mg | Freq: Once | INTRAVENOUS | Status: AC
  Administered 2019-03-11: 0.25 mg via INTRAVENOUS
  Filled 2019-03-11: qty 5

## 2019-03-11 MED ORDER — SODIUM CHLORIDE 0.9% FLUSH
10.0000 mL | Freq: Once | INTRAVENOUS | Status: AC
  Administered 2019-03-11: 10 mL via INTRAVENOUS
  Filled 2019-03-11: qty 10

## 2019-03-11 MED ORDER — HEPARIN SOD (PORK) LOCK FLUSH 100 UNIT/ML IV SOLN: 500.0000 [IU] | Freq: Once | INTRAVENOUS | Status: AC

## 2019-03-11 MED ORDER — DEXAMETHASONE SODIUM PHOSPHATE 10 MG/ML IJ SOLN
10.0000 mg | Freq: Once | INTRAMUSCULAR | Status: AC
  Administered 2019-03-11: 10 mg via INTRAVENOUS
  Filled 2019-03-11: qty 1

## 2019-03-11 NOTE — Progress Notes (Signed)
Pt did not take the atb or steroids that wa prescribed. He said sugars went up high and he did not have insulin at home like the used in hospital. He wants to know if e cna have flu hsot 9/9. He is taking ibuprofen for his chest like pain. He has no pain right now

## 2019-03-11 NOTE — Telephone Encounter (Signed)
I called the wife about pt's appts. I did not get back there soon enough to give him his appts. He likes the ones that has vital signs and wt. I have printed the last one and the newest one. I will put it in the mail. I did tell the wife that his next appt is 9/8 to arrive at 10:30 for labs. She will let him know about it.

## 2019-03-11 NOTE — Progress Notes (Signed)
Hematology/Oncology Consult note [facility]  [phone] [phone]  Patient Care Team: Jerrol Banana., MD as PCP - General (Family Medicine) Dingeldein, [name], MD as Consulting Physician (Ophthalmology) [name] as Consulting Physician (Internal Medicine) [name], MD as Consulting Physician (Urology) [name], MD as Referring Physician (Hematology and Oncology) [name], MD as Consulting Physician (Pulmonary Disease)   Name of the patient: Patrick Mcgee  [id number]  1945-08-18   Date of visit: 03/11/19  Diagnosis- 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 [name] so far for his metastatic urothelial carcinoma. This was originally diagnosed in May 2019. He was noted to have a filling defect in the lower pole collecting system of the left kidney along with para-aortic and retroperitoneal adenopathy concerning for metastatic disease. He underwent left ureteroscopy and renal pelvis biopsy which revealed small fragments of high-grade urothelial carcinoma with small focus of invasion. He was started on carboplatin and gemcitabine chemotherapy in June 2019 and was continued on 05/27/2018 for 8 cycles. He tolerated chemotherapy well except for chemo-induced anemia for which she has been getting Procrit every 2 weeks. He did have response to his disease based on scans in August 2019. However repeat scan on 05/31/2018 showed increase in the size of the primary tumor from 1.2 to 1.9 cm and increase in left para-aortic adenopathy from 0.9 to 1.3 cm. Periportal adenopathy was stable at 1.4 cm. Second line immunotherapy was recommended. His initial biopsy specimen did not have enough sample to undergo  FGFR mutation testing. He also has mild interstitial lung disease for which he sees pulmonary but he is not on home oxygen. He has B12 deficiency for which he is on oral B12. Also has diabetes and coronary artery disease.Tecentriq started on 06/17/2018.Patient noted to have disease progression in his lymph nodes in May 2020. Repeat biopsy showed metastatic urothelial carcinoma which did not have aFGFR mutation. He has been started on third line Padcev  Interval history-patient did not get his treatment last week as he was admitted to the hospital with atypical chest pain.  Cardiac work-up as well as CT PE was negative.  He reports mild midsternal pain which comes and goes.  He has been taking as needed ibuprofen and states it has been helping him.  He has also been taking Neurontin for his neuropathic pain and is up to twice a day at this time  ECOG PS- 1 Pain scale- 0  Review of systems- Review of Systems  Constitutional: Positive for malaise/fatigue. Negative for chills, fever and weight loss.  HENT: Negative for congestion, ear discharge and nosebleeds.   Eyes: Negative for blurred vision.  Respiratory: Negative for cough, hemoptysis, sputum production, shortness of breath and wheezing.   Cardiovascular: Negative for chest pain, palpitations, orthopnea and claudication.       Midsternal pain  Gastrointestinal: Negative for abdominal pain, blood in stool, constipation, diarrhea, heartburn, melena, nausea and vomiting.  Genitourinary: Negative for dysuria, flank pain, frequency, hematuria and urgency.  Musculoskeletal: Negative for back pain, joint pain and myalgias.  Skin: Negative for rash.  Neurological: Positive for sensory change (Peripheral neuropathy). Negative for dizziness, tingling, focal weakness, seizures, weakness and headaches.  Endo/Heme/Allergies: Does not bruise/bleed easily.  Psychiatric/Behavioral: Negative for depression and suicidal ideas. The patient does not have  insomnia.       Allergies  Allergen Reactions   Sulfa Antibiotics Other (See Comments)    Joint pain   Ace Inhibitors Cough   Invokana [Canagliflozin] Other (See Comments)    Leg pain   Penicillins Rash    Did it involve swelling of the face/tongue/throat, SOB, or low BP? No Did it involve sudden or severe rash/hives, skin peeling, or any reaction on the inside of your mouth or nose? Yes Did you need to seek medical attention at a hospital or doctor's office? Yes When did it last happen?childhood allergy If all above answers are NO, may proceed with cephalosporin use.      Past Medical History:  Diagnosis Date   Acid reflux    Anxiety    Depression    Diabetes mellitus without complication ([place])    Hyperlipidemia    Hypertension    MRSA (methicillin resistant Staphylococcus aureus) infection 1992   history   Myocardial infarction ([place]) 1995   Sleep apnea    CPAP   Urothelial cancer ([place]) 11/2017   Left Urothelial mass, chemo tx's     Past Surgical History:  Procedure Laterality Date   CATARACT EXTRACTION Left    COLONOSCOPY  2010   [facility]   COLONOSCOPY WITH PROPOFOL N/A 10/04/2016   Procedure: COLONOSCOPY WITH PROPOFOL;  Surgeon: [name], MD;  Location: [facility];  Service: Endoscopy;  Laterality: N/A;   [place], 2000, 2001, 2014   CYSTOSCOPY W/ RETROGRADES Bilateral 12/10/2017   Procedure: CYSTOSCOPY WITH RETROGRADE PYELOGRAM;  Surgeon: [name], MD;  Location: [facility];  Service: Urology;  Laterality: Bilateral;   CYSTOSCOPY W/ RETROGRADES Left 12/09/2018   Procedure: CYSTOSCOPY WITH RETROGRADE PYELOGRAM;  Surgeon: [name], MD;  Location: [facility];  Service: Urology;  Laterality: Left;   CYSTOSCOPY WITH BIOPSY Left 12/09/2018   Procedure: CYSTOSCOPY WITH URETERAL/RENAL PELVIC BIOPSY;  Surgeon: [name], MD;  Location: [facility];  Service: Urology;  Laterality:  Left;   CYSTOSCOPY WITH STENT PLACEMENT Left 12/10/2017   Procedure: CYSTOSCOPY WITH STENT PLACEMENT;  Surgeon: [name], MD;  Location: [facility];  Service: Urology;  Laterality: Left;   CYSTOSCOPY WITH STENT PLACEMENT Left 12/09/2018   Procedure: CYSTOSCOPY WITH STENT PLACEMENT;  Surgeon: [name], MD;  Location: [facility];  Service: Urology;  Laterality: Left;   CYSTOSCOPY WITH URETEROSCOPY Left 12/09/2018   Procedure: CYSTOSCOPY WITH URETEROSCOPY;  Surgeon: [name], MD;  Location: [facility];  Service: Urology;  Laterality: Left;   EYE SURGERY Bilateral    cataract   INGUINAL HERNIA REPAIR Right 08/09/2015   Procedure: HERNIA REPAIR INGUINAL ADULT;  Surgeon: [name], MD;  Location: [facility];  Service: General;  Laterality: Right;   NASAL SINUS SURGERY     nuclear stress test     PORTA CATH INSERTION N/A 12/19/2017   Procedure: PORTA CATH INSERTION;  Surgeon: [name], MD;  Location: [facility];  Service: Cardiovascular;  Laterality: N/A;   URETERAL BIOPSY Left 12/10/2017   Procedure: URETERAL & renal PELVIS BIOPSY;  Surgeon: [name], MD;  Location: [facility];  Service: Urology;  Laterality: Left;   URETEROSCOPY Left 12/10/2017   Procedure: URETEROSCOPY;  Surgeon: [name], MD;  Location: [facility];  Service: Urology;  Laterality: Left;    Social History   Socioeconomic History   Marital status: Married    Spouse name: [name]   Number of children: 1   Years  of education: Not on file   Highest education level: Associate degree: occupational, Hotel manager, or vocational program  Occupational History   Occupation: retired  Scientist, product/process development strain: Not hard at [employer]    Worry: Never true    Inability: Never true   Transportation needs    Medical: No    Non-medical: No  Tobacco Use   Smoking status: Former Smoker    Types: Cigars    Quit date: 10/09/1978    Years since quitting: 40.4    Smokeless tobacco: Former Systems developer    Types: [place] date: 12/04/1988   Tobacco comment: on occasion  Substance and Sexual Activity   Alcohol use: Not Currently   Drug use: No   Sexual activity: Not Currently  Lifestyle   Physical activity    Days per week: 0 days    Minutes per session: 0 min   Stress: Only a little  Relationships   Social connections    Talks on phone: More than three times a week    Gets together: More than three times a week    Attends religious service: More than 4 times per year    Active member of club or organization: Not on file    Attends meetings of clubs or organizations: Not on file    Relationship status: Married   Intimate partner violence    Fear of current or ex partner: No    Emotionally abused: No    Physically abused: No    Forced sexual activity: No  Other Topics Concern   Not on file  Social History Narrative   Not on file    Family History  Problem Relation Age of Onset   Heart disease Mother    Cancer Father        Lung and colon cancer   Heart disease Father    Emphysema Maternal Grandfather    Tuberculosis Maternal Grandmother      Current Outpatient Medications:    aspirin EC 81 MG tablet, Take 81 mg by mouth every evening. , Disp: , Rfl:    atorvastatin (LIPITOR) 40 MG tablet, Take 1 tablet (40 mg total) by mouth at bedtime., Disp: 90 tablet, Rfl: 3   carvedilol (COREG) 12.5 MG tablet, TAKE 1 TABLET BY MOUTH TWICE DAILY (Patient taking differently: Take 12.5 mg by mouth 2 (two) times a day. ), Disp: 180 tablet, Rfl: 3   clobetasol cream (TEMOVATE) [id number] %, Apply 1 application topically as needed (for skin rash)., Disp: 30 g, Rfl: 1   empagliflozin (JARDIANCE) 10 MG TABS tablet, Take 10 mg by mouth daily. (Patient taking differently: Take 10 mg by mouth daily as needed (elevated blood sugar). ), Disp: 90 tablet, Rfl: 3   fluticasone (FLONASE) 50 MCG/ACT nasal spray, Use 2 spray(s) in each nostril once  daily (Patient taking differently: Place 2 sprays into both nostrils daily as needed for allergies. ), Disp: 48 g, Rfl: 3   gabapentin (NEURONTIN) 300 MG capsule, Take 1 capsule (300 mg total) by mouth 3 (three) times daily., Disp: 90 capsule, Rfl: 0   glucose blood (CONTOUR NEXT TEST) test strip, Check blood sugars 3 times daily., Disp: 300 each, Rfl: 11   hydrOXYzine (ATARAX/VISTARIL) 25 MG tablet, Take 1 tablet (25 mg total) by mouth every 8 (eight) hours as needed for itching., Disp: 30 tablet, Rfl: 3   Ivermectin (SOOLANTRA) 1 % CREA, Apply 1 application topically daily as needed (  rosacea). To face, Disp: , Rfl:    lidocaine-prilocaine (EMLA) cream, Apply 1 application topically as needed (port access)., Disp: 1 g, Rfl: 3   loratadine (CLARITIN) 10 MG tablet, Take 10 mg by mouth daily as needed for allergies., Disp: , Rfl:    losartan (COZAAR) 100 MG tablet, TAKE 1 TABLET BY MOUTH ONCE DAILY (Patient taking differently: Take 100 mg by mouth at bedtime. ), Disp: 90 tablet, Rfl: 3   magnesium hydroxide (MILK OF MAGNESIA) 400 MG/5ML suspension, Take 15 mLs by mouth daily as needed for mild constipation., Disp: , Rfl:    metFORMIN (GLUCOPHAGE) 1000 MG tablet, TAKE 1 TABLET BY MOUTH TWICE DAILY WITH MEALS (Patient taking differently: Take 1,000 mg by mouth 2 (two) times a day. ), Disp: 180 tablet, Rfl: 3   nystatin cream (MYCOSTATIN), Apply topically 2 (two) times daily. (Patient taking differently: Apply 1 application topically 2 (two) times daily as needed (rash). ), Disp: 15 g, Rfl: 1   OVER THE COUNTER MEDICATION, Apply 1 application topically daily as needed (pain). [place]back topical pain relief, Disp: , Rfl:    predniSONE (DELTASONE) 10 MG tablet, Take 4 tablets (40 mg total) by mouth daily., Disp: 30 tablet, Rfl: 0   sertraline (ZOLOFT) 100 MG tablet, Take 1 tablet (100 mg total) by mouth daily., Disp: 90 tablet, Rfl: 3   vitamin B-12 (CYANOCOBALAMIN) 1000 MCG tablet, Take 1,000 mcg  by mouth daily., Disp: , Rfl:    vitamin C (ASCORBIC ACID) 500 MG tablet, Take 1,000-1,500 mg by mouth daily as needed (immune support)., Disp: , Rfl:  No current facility-administered medications for this visit.   Facility-Administered Medications Ordered in Other Visits:    Influenza vac split quadrivalent PF (FLUZONE HIGH-DOSE) injection 0.5 mL, 0.5 mL, Intramuscular, Once, Karen Kitchens, NP  Physical exam:  Vitals:   03/11/19 0903  BP: 117/76  Pulse: 63  Resp: 16  Temp: 98.3 F (36.8 C)  TempSrc: Tympanic  Weight: 153 lb 12.8 oz (69.8 kg)  Height: 5\' 8"  (1.727 m)   Physical Exam HENT:     Head: Normocephalic and atraumatic.  Eyes:     Pupils: Pupils are equal, round, and reactive to light.  Neck:     Musculoskeletal: Normal range of motion.  Cardiovascular:     Rate and Rhythm: Normal rate and regular rhythm.     Heart sounds: Normal heart sounds.  Pulmonary:     Effort: Pulmonary effort is normal.     Breath sounds: Normal breath sounds.  Abdominal:     General: Bowel sounds are normal.     Palpations: Abdomen is soft.  Skin:    General: Skin is warm and dry.  Neurological:     Mental Status: He is alert and oriented to person, place, and time.      CMP Latest Ref Rng & Units 03/05/2019  Glucose 70 - 99 mg/dL 145(H)  BUN 8 - 23 mg/dL 26(H)  Creatinine 0.61 - 1.24 mg/dL 0.64  Sodium 135 - 145 mmol/L 135  Potassium 3.5 - 5.1 mmol/L 4.0  Chloride 98 - 111 mmol/L 102  CO2 22 - 32 mmol/L 26  Calcium 8.9 - 10.3 mg/dL 9.7  Total Protein 6.5 - 8.1 g/dL -  Total Bilirubin 0.3 - 1.2 mg/dL -  Alkaline Phos 38 - 126 U/L -  AST 15 - 41 U/L -  ALT 0 - 44 U/L -   CBC Latest Ref Rng & Units 03/05/2019  WBC 4.0 - 10.5  K/uL 13.3(H)  Hemoglobin 13.0 - 17.0 g/dL 11.0(L)  Hematocrit 39.0 - 52.0 % 32.8(L)  Platelets 150 - 400 K/uL 182    No images are attached to the encounter.  Ct Angio Chest Pe W And/or Wo Contrast  Result Date: 03/03/2019 CLINICAL DATA:  Chest  pain history of bladder cancer EXAM: CT ANGIOGRAPHY CHEST WITH CONTRAST TECHNIQUE: Multidetector CT imaging of the chest was performed using the standard protocol during bolus administration of intravenous contrast. Multiplanar CT image reconstructions and MIPs were obtained to evaluate the vascular anatomy. CONTRAST:  67mL OMNIPAQUE IOHEXOL 350 MG/ML SOLN COMPARISON:  Chest x-ray 03/03/2019, CT chest 11/25/2018 FINDINGS: Cardiovascular: Satisfactory opacification of the pulmonary arteries to the segmental level. No evidence of pulmonary embolism. Nonaneurysmal aorta. No dissection is seen. Mild aortic atherosclerosis. Coronary vascular calcification. Borderline cardiomegaly. Right-sided central venous port tip in the proximal right atrium. No pericardial effusion Mediastinum/Nodes: Midline trachea. No thyroid mass. Stable 12 mm right precarinal lymph node. Small hiatal hernia. Lungs/Pleura: No consolidation, pleural effusion or pneumothorax. Bilateral septal thickening with mild hazy density at the lower lobes. Upper Abdomen: No acute abnormality Musculoskeletal: No acute or suspicious osseous abnormality Review of the MIP images confirms the above findings. IMPRESSION: 1. Negative for acute pulmonary embolus or aortic dissection 2. Bilateral interstitial thickening, suspect for interstitial lung disease. Scattered hazy bilateral lower lobe densities, probable atelectasis. Aortic Atherosclerosis (ICD10-I70.0). Electronically Signed   By: Donavan Foil M.D.   On: 03/03/2019 22:22   Dg Chest Portable 1 View  Result Date: 03/03/2019 CLINICAL DATA:  Chest pain, stage IV bladder cancer EXAM: PORTABLE CHEST 1 VIEW COMPARISON:  CT chest Nov 24, 2020 FINDINGS: Twenty right IJ approach Port-A-Cath tip terminates in the right atrium. Multiple cardiac monitoring leads overlie the chest. Coarse interstitial opacities are again seen throughout the lungs. No consolidation, features of edema, pneumothorax, or effusion.  Pulmonary vascularity is normally distributed. The aorta is calcified and tortuous. The remaining cardiomediastinal contours are unremarkable. No acute osseous or soft tissue abnormality. Degenerative changes are present in the imaged spine and shoulders. IMPRESSION: 1. Right IJ approach Port-A-Cath tip terminates in the right atrium. 2. Coarse interstitial opacities throughout the lungs, unchanged. 3. No acute cardiopulmonary abnormality. Electronically Signed   By: Lovena Le M.D.   On: 03/03/2019 20:59     Assessment and plan- Patient is a 73 y.o. male with history of metastatic urothelial carcinoma with metastases to the lymph nodes.He had disease progression on carboplatin/gemcitabine as well as second line Tecentriq.  He is here for on treatment assessment prior to cycle 3-day 1 of Padcev  Midsternal pain.  Etiology unclear.  Cardiology work-up was negative.  CT chest did not show any evidence of pulmonary embolism. padcev typically does not lead to chest pain either.  I have recommended that he should stop taking as needed ibuprofen and try as needed Tylenol instead.  Patient does not want to try any stronger opioids at this time.  He will call us if his pain does not get better.  Counts are otherwise okay to proceed with cycle 1 of padcev today.  He will directly proceed for cycle 3-day 8 of treatment next week and I will see him back in 2 weeks for cycle 3-day 15.  Plan to get CT abdomen and pelvis with contrast after 3 cycles.  Chemo-induced peripheral neuropathy: He will increase his gabapentin to 300 mg 3 times daily.  I will reassess his symptoms in 2 weeks   Visit Diagnosis  1. Urothelial carcinoma of kidney, left ([place])   2. Encounter for antineoplastic chemotherapy   3. Chemotherapy-induced peripheral neuropathy ([place])      Dr. Randa Evens, MD, MPH [facility] at [facility] [id number] 03/11/2019 8:37 AM

## 2019-03-11 NOTE — Progress Notes (Signed)
Per Dr. Janese Banks it is ok to proceed with Padcev treatment today with a blood glucose level of 251.

## 2019-03-18 ENCOUNTER — Other Ambulatory Visit: Payer: Self-pay

## 2019-03-18 ENCOUNTER — Ambulatory Visit: Payer: Medicare Other

## 2019-03-18 ENCOUNTER — Ambulatory Visit: Payer: Medicare Other | Admitting: Oncology

## 2019-03-18 ENCOUNTER — Inpatient Hospital Stay: Payer: Medicare Other

## 2019-03-18 ENCOUNTER — Other Ambulatory Visit: Payer: Medicare Other

## 2019-03-18 VITALS — BP 94/61 | HR 60 | Temp 95.2°F | Resp 18 | Wt 151.5 lb

## 2019-03-18 DIAGNOSIS — C652 Malignant neoplasm of left renal pelvis: Secondary | ICD-10-CM | POA: Diagnosis not present

## 2019-03-18 DIAGNOSIS — D701 Agranulocytosis secondary to cancer chemotherapy: Secondary | ICD-10-CM

## 2019-03-18 DIAGNOSIS — C772 Secondary and unspecified malignant neoplasm of intra-abdominal lymph nodes: Secondary | ICD-10-CM | POA: Diagnosis not present

## 2019-03-18 DIAGNOSIS — Z95828 Presence of other vascular implants and grafts: Secondary | ICD-10-CM

## 2019-03-18 DIAGNOSIS — C642 Malignant neoplasm of left kidney, except renal pelvis: Secondary | ICD-10-CM

## 2019-03-18 DIAGNOSIS — Z5112 Encounter for antineoplastic immunotherapy: Secondary | ICD-10-CM | POA: Diagnosis not present

## 2019-03-18 LAB — CBC WITH DIFFERENTIAL/PLATELET
Abs Immature Granulocytes: 0.03 10*3/uL (ref 0.00–0.07)
Basophils Absolute: 0 10*3/uL (ref 0.0–0.1)
Basophils Relative: 1 %
Eosinophils Absolute: 0.2 10*3/uL (ref 0.0–0.5)
Eosinophils Relative: 3 %
HCT: 33.4 % — ABNORMAL LOW (ref 39.0–52.0)
Hemoglobin: 10.9 g/dL — ABNORMAL LOW (ref 13.0–17.0)
Immature Granulocytes: 0 %
Lymphocytes Relative: 18 %
Lymphs Abs: 1.3 10*3/uL (ref 0.7–4.0)
MCH: 28.6 pg (ref 26.0–34.0)
MCHC: 32.6 g/dL (ref 30.0–36.0)
MCV: 87.7 fL (ref 80.0–100.0)
Monocytes Absolute: 0.7 10*3/uL (ref 0.1–1.0)
Monocytes Relative: 10 %
Neutro Abs: 4.8 10*3/uL (ref 1.7–7.7)
Neutrophils Relative %: 68 %
Platelets: 217 10*3/uL (ref 150–400)
RBC: 3.81 MIL/uL — ABNORMAL LOW (ref 4.22–5.81)
RDW: 15.5 % (ref 11.5–15.5)
WBC: 7.1 10*3/uL (ref 4.0–10.5)
nRBC: 0 % (ref 0.0–0.2)

## 2019-03-18 LAB — COMPREHENSIVE METABOLIC PANEL
ALT: 13 U/L (ref 0–44)
AST: 14 U/L — ABNORMAL LOW (ref 15–41)
Albumin: 3.5 g/dL (ref 3.5–5.0)
Alkaline Phosphatase: 63 U/L (ref 38–126)
Anion gap: 8 (ref 5–15)
BUN: 18 mg/dL (ref 8–23)
CO2: 28 mmol/L (ref 22–32)
Calcium: 9.3 mg/dL (ref 8.9–10.3)
Chloride: 99 mmol/L (ref 98–111)
Creatinine, Ser: 0.78 mg/dL (ref 0.61–1.24)
GFR calc Af Amer: >60 mL/min (ref >60)
GFR calc non Af Amer: >60 mL/min (ref >60)
Glucose, Bld: 227 mg/dL — ABNORMAL HIGH (ref 70–99)
Potassium: 4.1 mmol/L (ref 3.5–5.1)
Sodium: 135 mmol/L (ref 135–145)
Total Bilirubin: 0.9 mg/dL (ref 0.3–1.2)
Total Protein: 6.7 g/dL (ref 6.5–8.1)

## 2019-03-18 MED ORDER — HEPARIN SOD (PORK) LOCK FLUSH 100 UNIT/ML IV SOLN
500.0000 [IU] | Freq: Once | INTRAVENOUS | Status: AC
  Administered 2019-03-18: 500 [IU] via INTRAVENOUS
  Filled 2019-03-18: qty 5

## 2019-03-18 MED ORDER — HEPARIN SOD (PORK) LOCK FLUSH 100 UNIT/ML IV SOLN: 500.0000 [IU] | Freq: Once | INTRAVENOUS | Status: DC | PRN

## 2019-03-18 MED ORDER — PALONOSETRON HCL INJECTION 0.25 MG/5ML
0.2500 mg | Freq: Once | INTRAVENOUS | Status: AC
  Administered 2019-03-18: 0.25 mg via INTRAVENOUS
  Filled 2019-03-18: qty 5

## 2019-03-18 MED ORDER — SODIUM CHLORIDE 0.9% FLUSH
10.0000 mL | Freq: Once | INTRAVENOUS | Status: AC
  Administered 2019-03-18: 11:00:00 10 mL via INTRAVENOUS
  Filled 2019-03-18: qty 10

## 2019-03-18 MED ORDER — SODIUM CHLORIDE 0.9 % IV SOLN
1.0000 mg/kg | Freq: Once | INTRAVENOUS | Status: AC
  Administered 2019-03-18: 12:00:00 70 mg via INTRAVENOUS
  Filled 2019-03-18: qty 7

## 2019-03-18 MED ORDER — SODIUM CHLORIDE 0.9 % IV SOLN
Freq: Once | INTRAVENOUS | Status: AC
  Administered 2019-03-18: 11:00:00 via INTRAVENOUS
  Filled 2019-03-18: qty 250

## 2019-03-18 MED ORDER — DEXAMETHASONE SODIUM PHOSPHATE 10 MG/ML IJ SOLN
10.0000 mg | Freq: Once | INTRAMUSCULAR | Status: AC
  Administered 2019-03-18: 11:00:00 10 mg via INTRAVENOUS
  Filled 2019-03-18: qty 1

## 2019-03-19 ENCOUNTER — Ambulatory Visit (INDEPENDENT_AMBULATORY_CARE_PROVIDER_SITE_OTHER): Payer: Medicare Other

## 2019-03-19 DIAGNOSIS — Z23 Encounter for immunization: Secondary | ICD-10-CM | POA: Diagnosis not present

## 2019-03-20 NOTE — Progress Notes (Signed)
Subjective:   Patrick Mcgee is a 73 y.o. male who presents for Medicare Annual/Subsequent preventive examination.  Review of Systems:  N/A  Cardiac Risk Factors include: advanced age (>9men, >51 women);diabetes mellitus;dyslipidemia;hypertension;male gender     Objective:    Vitals: BP (!) 88/52 (BP Location: Right Arm)   Pulse 64   Temp 97.6 F (36.4 C) (Oral)   Ht 5\' 8"  (1.727 m)   Wt 150 lb (68 kg)   BMI 22.81 kg/m   Body mass index is 22.81 kg/m.  Advanced Directives 03/24/2019 03/11/2019 03/04/2019 02/11/2019 01/28/2019 01/14/2019 12/24/2018  Does Patient Have a Medical Advance Directive? Yes Yes Yes Yes Yes Yes Yes  Type of Paramedic of [place];Living will [place];Living will Living will;Healthcare Power of Attorney Living will;Healthcare Power of Attorney Living will;Healthcare Power of [place];Living will Living will;Healthcare Power of Attorney  Does patient want to make changes to medical advance directive? - No - Patient declined No - Patient declined - - - No - Patient declined  Copy of [place] in Chart? Yes - validated most recent copy scanned in chart (See row information) Yes - validated most recent copy scanned in chart (See row information) Yes - validated most recent copy scanned in chart (See row information) - Yes - validated most recent copy scanned in chart (See row information) - Yes - validated most recent copy scanned in chart (See row information)  Would patient like information on creating a medical advance directive? - - - - - - -    Tobacco Social History   Tobacco Use  Smoking Status Former Smoker  . Types: Cigars  . Quit date: 10/09/1978  . Years since quitting: 40.4  Smokeless Tobacco Former Systems developer  . Types: Chew  . Quit date: 12/04/1988  Tobacco Comment   on occasion     Counseling given: Not Answered Comment: on occasion   Clinical Intake:   Pre-visit preparation completed: Yes  Pain : No/denies pain Pain Score: 0-No pain     Nutritional Status: BMI of 19-24  Normal Nutritional Risks: None Diabetes: Yes  How often do you need to have someone help you when you read instructions, pamphlets, or other written materials from your doctor or pharmacy?: 2 - Rarely   Diabetes:  Is the patient diabetic?  Yes type 2 If diabetic, was a CBG obtained today?  No  Did the patient bring in their glucometer from home?  No  How often do you monitor your CBG's? Two to three times daily.   Financial Strains and Diabetes Management:  Are you having any financial strains with the device, your supplies or your medication? No .  Does the patient want to be seen by Chronic Care Management for management of their diabetes?  No  Would the patient like to be referred to a Nutritionist or for Diabetic Management?  No   Diabetic Exams:  Diabetic Eye Exam: Completed 10/24/16. Overdue for diabetic eye exam. Pt has been advised about the importance in completing this exam. Eye exam scheduled for 04/02/19.  Diabetic Foot Exam: Completed 08/11/11. Pt has been advised about the importance in completing this exam. Note made to follow up on this at next apt.   Interpreter Needed?: No  Information entered by :: [facility], LPN  Past Medical History:  Diagnosis Date  . Acid reflux   . Anxiety   . Depression   . Diabetes mellitus without complication (  [place])   . Hyperlipidemia   . Hypertension   . MRSA (methicillin resistant Staphylococcus aureus) infection 1992   history  . Myocardial infarction ([place]) 1995  . Sleep apnea    CPAP  . 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 CATARACT EXTRACTION Left   . COLONOSCOPY  2010   [facility]  . COLONOSCOPY WITH PROPOFOL N/A 10/04/2016   Procedure: COLONOSCOPY WITH PROPOFOL;  Surgeon: [name], MD;  Location: [facility]  ENDOSCOPY;  Service: Endoscopy;  Laterality: N/A;  . [place], 2000, 2001, 2014  . CYSTOSCOPY W/ RETROGRADES Bilateral 12/10/2017   Procedure: CYSTOSCOPY WITH RETROGRADE PYELOGRAM;  Surgeon: [name], MD;  Location: [facility] ORS;  Service: Urology;  Laterality: Bilateral;  . CYSTOSCOPY W/ RETROGRADES Left 12/09/2018   Procedure: CYSTOSCOPY WITH RETROGRADE PYELOGRAM;  Surgeon: [name], MD;  Location: [facility] ORS;  Service: Urology;  Laterality: Left;  . CYSTOSCOPY WITH BIOPSY Left 12/09/2018   Procedure: CYSTOSCOPY WITH URETERAL/RENAL PELVIC BIOPSY;  Surgeon: [name], MD;  Location: [facility] ORS;  Service: Urology;  Laterality: Left;  . CYSTOSCOPY WITH STENT PLACEMENT Left 12/10/2017   Procedure: CYSTOSCOPY WITH STENT PLACEMENT;  Surgeon: [name], MD;  Location: [facility] ORS;  Service: Urology;  Laterality: Left;  . CYSTOSCOPY WITH STENT PLACEMENT Left 12/09/2018   Procedure: CYSTOSCOPY WITH STENT PLACEMENT;  Surgeon: [name], MD;  Location: [facility] ORS;  Service: Urology;  Laterality: Left;  . CYSTOSCOPY WITH URETEROSCOPY Left 12/09/2018   Procedure: CYSTOSCOPY WITH URETEROSCOPY;  Surgeon: [name], MD;  Location: [facility] ORS;  Service: Urology;  Laterality: Left;  . EYE SURGERY Bilateral    cataract  . INGUINAL HERNIA REPAIR Right 08/09/2015   Procedure: HERNIA REPAIR INGUINAL ADULT;  Surgeon: [name], MD;  Location: [facility] ORS;  Service: General;  Laterality: Right;  . NASAL SINUS SURGERY    . nuclear stress test    . PORTA CATH INSERTION N/A 12/19/2017   Procedure: PORTA CATH INSERTION;  Surgeon: [name], MD;  Location: [place] [facility];  Service: Cardiovascular;  Laterality: N/A;  . URETERAL BIOPSY Left 12/10/2017   Procedure: URETERAL & renal PELVIS BIOPSY;  Surgeon: [name], MD;  Location: [facility] ORS;  Service: Urology;  Laterality: Left;  . URETEROSCOPY Left 12/10/2017   Procedure: URETEROSCOPY;  Surgeon:  [name], MD;  Location: [facility] ORS;  Service: Urology;  Laterality: Left;   Family History  Problem Relation Age of Onset  . Heart disease Mother   . Cancer Father        Lung and colon cancer  . Heart disease Father   . Emphysema Maternal Grandfather   . Tuberculosis Maternal Grandmother    Social History   Socioeconomic History  . Marital status: Married    Spouse name: [name]  . Number of children: 1  . Years of education: Not on file  . Highest education level: Associate degree: occupational, Hotel manager, or vocational program  Occupational History  . Occupation: retired  Scientific laboratory technician  . Financial resource strain: Not hard at all  . Food insecurity    Worry: Never true    Inability: Never true  . Transportation needs    Medical: No    Non-medical: No  Tobacco Use  . Smoking status: Former Smoker    Types: Cigars    Quit date: 10/09/1978    Years since quitting: 40.4  .  Smokeless tobacco: Former Systems developer    Types: Chew    Quit date: 12/04/1988  . Tobacco comment: on occasion  Substance and Sexual Activity  . Alcohol use: Not Currently  . Drug use: No  . Sexual activity: Not Currently  Lifestyle  . Physical activity    Days per week: 0 days    Minutes per session: 0 min  . Stress: Not at all  Relationships  . Social Herbalist on phone: Patient refused    Gets together: Patient refused    Attends religious service: Patient refused    Active member of club or organization: Patient refused    Attends meetings of clubs or organizations: Patient refused    Relationship status: Patient refused  Other Topics Concern  . Not on file  Social History Narrative  . Not on file    Outpatient Encounter Medications as of 03/24/2019  Medication Sig  . aspirin EC 81 MG tablet Take 81 mg by mouth every evening.   Marland Kitchen atorvastatin (LIPITOR) 40 MG tablet Take 1 tablet (40 mg total) by mouth at bedtime.  . carvedilol (COREG) 12.5 MG tablet TAKE 1 TABLET BY MOUTH TWICE  DAILY (Patient taking differently: Take 12.5 mg by mouth 2 (two) times a day. )  . clobetasol cream (TEMOVATE) AB-123456789 % Apply 1 application topically as needed (for skin rash).  . empagliflozin (JARDIANCE) 10 MG TABS tablet Take 10 mg by mouth daily. (Patient taking differently: Take 10 mg by mouth daily as needed (elevated blood sugar). )  . fluticasone (FLONASE) 50 MCG/ACT nasal spray Use 2 spray(s) in each nostril once daily (Patient taking differently: Place 2 sprays into both nostrils daily as needed for allergies. )  . gabapentin (NEURONTIN) 300 MG capsule Take 1 capsule (300 mg total) by mouth 3 (three) times daily.  Marland Kitchen glucose blood (CONTOUR NEXT TEST) test strip Check blood sugars 3 times daily.  . hydrOXYzine (ATARAX/VISTARIL) 25 MG tablet Take 1 tablet (25 mg total) by mouth every 8 (eight) hours as needed for itching.  . Ivermectin (SOOLANTRA) 1 % CREA Apply 1 application topically daily as needed (rosacea). To face  . lidocaine-prilocaine (EMLA) cream Apply 1 application topically as needed (port access).  Marland Kitchen loratadine (CLARITIN) 10 MG tablet Take 10 mg by mouth daily as needed for allergies. [employer] brand allergy relief  . losartan (COZAAR) 100 MG tablet TAKE 1 TABLET BY MOUTH ONCE DAILY (Patient taking differently: Take 100 mg by mouth at bedtime. )  . magnesium hydroxide (MILK OF MAGNESIA) 400 MG/5ML suspension Take 15 mLs by mouth daily as needed for mild constipation.  . metFORMIN (GLUCOPHAGE) 1000 MG tablet TAKE 1 TABLET BY MOUTH TWICE DAILY WITH MEALS (Patient taking differently: Take 1,000 mg by mouth 2 (two) times a day. )  . nystatin cream (MYCOSTATIN) Apply topically 2 (two) times daily. (Patient taking differently: Apply 1 application topically 2 (two) times daily as needed (rash). )  . OVER THE COUNTER MEDICATION Apply 1 application topically daily as needed (pain). Outback topical pain relief  . sertraline (ZOLOFT) 100 MG tablet Take 1 tablet (100 mg total) by mouth daily.  .  vitamin B-12 (CYANOCOBALAMIN) 1000 MCG tablet Take by mouth daily. Unsure dose  . ibuprofen (ADVIL) 200 MG tablet Take 200 mg by mouth every 6 (six) hours as needed.  . vitamin C (ASCORBIC ACID) 500 MG tablet Take 1,000-1,500 mg by mouth daily as needed (immune support).   Facility-Administered Encounter Medications as of 03/24/2019  Medication  . Influenza vac split quadrivalent PF (FLUZONE HIGH-DOSE) injection 0.5 mL    Activities of Daily Living In your present state of health, do you have any difficulty performing the following activities: 03/24/2019 03/04/2019  Hearing? N N  Vision? Tempie Donning  Comment Eye exam due- scheduled 04/02/19. -  Difficulty concentrating or making decisions? Y N  Comment Due to chemoherapy medicine. -  Walking or climbing stairs? Y N  Comment Due to weakness and balance issues. -  Dressing or bathing? N N  Doing errands, shopping? Y N  Comment Not driving currently. -  Preparing Food and eating ? N -  Using the Toilet? N -  In the past six months, have you accidently leaked urine? Y -  Comment More issues recently- due to waiting too long before going. -  Do you have problems with loss of bowel control? N -  Managing your Medications? N -  Managing your Finances? Y -  Comment Wife manages. -  Housekeeping or managing your Housekeeping? Y -  Comment Wife manages all cleaning. -  Some recent data might be hidden    Patient Care Team: Jerrol Banana., MD as PCP - General (Family Medicine) Dingeldein, [name], MD as Consulting Physician (Ophthalmology) [name] as Consulting Physician (Internal Medicine) [name], MD as Consulting Physician (Pulmonary Disease) Sindy Guadeloupe, MD as Consulting Physician (Oncology)   Assessment:   This is a routine wellness examination for Bob.  Exercise Activities and Dietary recommendations Current Exercise Habits: The patient does not participate in regular exercise at present, Exercise limited  by: neurologic condition(s);Other - see comments(Due to neuropathy and balance issues.)  Goals    . DIET - INCREASE WATER INTAKE     Recommend increasing water intake to 6-8 glasses a day.     . Exercise 3x per week (30 min per time)     Recommend to return to exercising 3 days a week for 30 minutes.        Fall Risk Fall Risk  03/24/2019 02/11/2019 06/17/2018 05/27/2018 03/19/2018  Falls in the past year? 1 0 0 0 No  Comment Due to neuropathy in feet. - - - -  Number falls in past yr: 1 - - - -  Injury with Fall? 0 - - - -  Follow up Falls prevention discussed - - - -   FALL RISK PREVENTION PERTAINING TO THE HOME:  Any stairs in or around the home? Yes  If so, are there any without handrails? No   Home free of loose throw rugs in walkways, pet beds, electrical cords, etc? Yes  Adequate lighting in your home to reduce risk of falls? Yes   ASSISTIVE DEVICES UTILIZED TO PREVENT FALLS:  Life alert? No  Use of a cane, walker or w/c? No  Grab bars in the bathroom? Yes  Shower chair or bench in shower? Yes  Elevated toilet seat or a handicapped toilet? Yes    TIMED UP AND GO:  Was the test performed? No .    Depression Screen PHQ 2/9 Scores 03/24/2019 03/19/2018 03/13/2017 03/08/2016  PHQ - 2 Score 2 1 0 0  PHQ- 9 Score 8 - - -    Cognitive Function: Declined today.      6CIT Screen 03/13/2017  What Year? 0 points  What month? 0 points  What time? 0 points  Count back from 20 0 points  Months in reverse 0 points  Repeat phrase 0 points  Total Score 0    Immunization History  Administered Date(s) Administered  . Fluad Quad(high Dose 65+) 03/19/2019  . Influenza, High Dose Seasonal PF 06/20/2016, 03/13/2017, 05/06/2018  . Pneumococcal Conjugate-13 01/29/2014  . Pneumococcal Polysaccharide-23 09/06/2011  . Tdap 08/27/2007  . Zoster 10/26/2010    Qualifies for Shingles Vaccine? Yes  Zostavax completed 10/16/10. Due for Shingrix. Education has been provided regarding the  importance of this vaccine. Pt has been advised to call insurance company to determine out of pocket expense. Advised may also receive vaccine at local pharmacy or Health Dept. Verbalized acceptance and understanding.  Tdap: Although this vaccine is not a covered service during a Wellness Exam, does the patient still wish to receive this vaccine today?  No .  Education has been provided regarding the importance of this vaccine. Advised may receive this vaccine at local pharmacy or Health Dept. Aware to provide a copy of the vaccination record if obtained from local pharmacy or Health Dept. Verbalized acceptance and understanding.  Flu Vaccine: Up to date  Pneumococcal Vaccine: Completed series  Screening Tests Health Maintenance  Topic Date Due  . FOOT EXAM  04/29/1954  . TETANUS/TDAP  08/26/2017  . OPHTHALMOLOGY EXAM  10/24/2017  . HEMOGLOBIN A1C  06/19/2019  . COLONOSCOPY  10/04/2021  . INFLUENZA VACCINE  Completed  . Hepatitis C Screening  Completed  . PNA vac Low Risk Adult  Completed   Cancer Screenings:  Colorectal Screening: Completed 10/04/16. Repeat every 5 years.  Lung Cancer Screening: (Low Dose CT Chest recommended if Age 74-80 years, 30 pack-year currently smoking OR have quit w/in 15years.) does not qualify.   Additional Screening:  Hepatitis C Screening: Up to date  Dental Screening: Recommended annual dental exams for proper oral hygiene  Community Resource Referral:  CRR required this visit?  No        Plan:  I have personally reviewed and addressed the Medicare Annual Wellness questionnaire and have noted the following in the patient's chart:  A. Medical and social history B. Use of alcohol, tobacco or illicit drugs  C. Current medications and supplements D. Functional ability and status E.  Nutritional status F.  Physical activity G. Advance directives H. List of other physicians I.  Hospitalizations, surgeries, and ER visits in previous 12 months J.   [place] such as hearing and vision if needed, cognitive and depression L. Referrals and appointments   In addition, I have reviewed and discussed with patient certain preventive protocols, quality metrics, and best practice recommendations. A written personalized care plan for preventive services as well as general preventive health recommendations were provided to patient.   Glendora Score, [place]  [id number] Nurse Health Advisor  Nurse Notes: Pt needs a diabetic foot exam today. Eye exam scheduled for 04/02/19.

## 2019-03-24 ENCOUNTER — Ambulatory Visit (INDEPENDENT_AMBULATORY_CARE_PROVIDER_SITE_OTHER): Payer: Medicare Other | Admitting: Family Medicine

## 2019-03-24 ENCOUNTER — Ambulatory Visit: Payer: Medicare Other

## 2019-03-24 ENCOUNTER — Ambulatory Visit (INDEPENDENT_AMBULATORY_CARE_PROVIDER_SITE_OTHER): Payer: Medicare Other

## 2019-03-24 ENCOUNTER — Other Ambulatory Visit: Payer: Self-pay

## 2019-03-24 VITALS — BP 88/52 | HR 64 | Temp 97.6°F | Ht 68.0 in | Wt 150.0 lb

## 2019-03-24 DIAGNOSIS — Z0000 Encounter for general adult medical examination without abnormal findings: Secondary | ICD-10-CM | POA: Diagnosis not present

## 2019-03-24 DIAGNOSIS — I25119 Atherosclerotic heart disease of native coronary artery with unspecified angina pectoris: Secondary | ICD-10-CM | POA: Diagnosis not present

## 2019-03-24 DIAGNOSIS — C642 Malignant neoplasm of left kidney, except renal pelvis: Secondary | ICD-10-CM

## 2019-03-24 DIAGNOSIS — K5909 Other constipation: Secondary | ICD-10-CM

## 2019-03-24 DIAGNOSIS — I10 Essential (primary) hypertension: Secondary | ICD-10-CM | POA: Diagnosis not present

## 2019-03-24 DIAGNOSIS — C689 Malignant neoplasm of urinary organ, unspecified: Secondary | ICD-10-CM | POA: Diagnosis not present

## 2019-03-24 DIAGNOSIS — E119 Type 2 diabetes mellitus without complications: Secondary | ICD-10-CM | POA: Diagnosis not present

## 2019-03-24 LAB — POCT GLYCOSYLATED HEMOGLOBIN (HGB A1C): Hemoglobin A1C: 7.8 % — AB (ref 4.0–5.6)

## 2019-03-24 NOTE — Progress Notes (Signed)
Patient: Patrick Mcgee Male    DOB: 02/12/1946   73 y.o.   MRN: [id number] Visit Date: 03/24/2019  Today's Provider: Wilhemena Durie, MD   Chief Complaint  Patient presents with  . Follow-up  . Constipation   Subjective:   Patient had AWV today.   HPI  Diabetes Mellitus Type II, Follow-up:   Lab Results  Component Value Date   HGBA1C 7.8 (A) 03/24/2019   HGBA1C 7.7 (A) 12/18/2018   HGBA1C 7.2 (A) 02/27/2018    Last seen for diabetes 3 months ago.  Management since then includes . He reports feeling fair compliance with treatment. He is not having side effects.  Current symptoms include paresthesia of the feet and have been worsening. Home blood sugar records: trend: decreasing steadily  Episodes of hypoglycemia? no   Current insulin regiment: Is not on insulin Most Recent Eye Exam: Weight trend: decreasing steadily Prior visit with dietician: Yes  Current exercise: not asked Current diet habits: not asked  Pertinent Labs:    Component Value Date/Time   CHOL 119 03/15/2017 1042   TRIG 60 03/15/2017 1042   HDL 45 03/15/2017 1042   LDLCALC 62 03/15/2017 1042   CREATININE 0.78 03/18/2019 1047   CREATININE 0.69 02/03/2013 2223    Wt Readings from Last 3 Encounters:  03/24/19 150 lb (68 kg)  03/18/19 151 lb 8 oz (68.7 kg)  03/11/19 153 lb 12.8 oz (69.8 kg)    Hypertension, follow-up:  BP Readings from Last 3 Encounters:  03/24/19 (!) 88/52  03/18/19 94/61  03/11/19 117/76    He was last seen for hypertension 3 months ago.  BP at that visit was . Management since that visit includes . He reports good compliance with treatment. He is not having side effects.  He is not exercising. He is adherent to low salt diet.   Outside blood pressures are good.Marland Kitchen He is experiencing none.   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 Mild worseing constipation of late. Allergies  Allergen Reactions  . Sulfa Antibiotics Other  (See Comments)    Joint pain  . Ace Inhibitors Cough  . Invokana [Canagliflozin] Other (See Comments)    Leg pain  . Penicillins Rash    Did it involve swelling of the face/tongue/throat, SOB, or low BP? No Did it involve sudden or severe rash/hives, skin peeling, or any reaction on the inside of your mouth or nose? Yes Did you need to seek medical attention at a hospital or doctor's office? Yes When did it last happen?childhood allergy If all above answers are "NO", may proceed with cephalosporin use.      Current Outpatient Medications:  .  aspirin EC 81 MG tablet, Take 81 mg by mouth every evening. , Disp: , Rfl:  .  atorvastatin (LIPITOR) 40 MG tablet, Take 1 tablet (40 mg total) by mouth at bedtime., Disp: 90 tablet, Rfl: 3 .  carvedilol (COREG) 12.5 MG tablet, TAKE 1 TABLET BY MOUTH TWICE DAILY (Patient taking differently: Take 12.5 mg by mouth 2 (two) times a day. ), Disp: 180 tablet, Rfl: 3 .  clobetasol cream (TEMOVATE) AB-123456789 %, Apply 1 application topically as needed (for skin rash)., Disp: 30 g, Rfl: 1 .  empagliflozin (JARDIANCE) 10 MG TABS tablet, Take 10 mg by mouth daily. (Patient taking differently: Take 10 mg by mouth daily as needed (elevated blood sugar). ), Disp: 90 tablet, Rfl: 3 .  fluticasone (FLONASE) 50 MCG/ACT  nasal spray, Use 2 spray(s) in each nostril once daily (Patient taking differently: Place 2 sprays into both nostrils daily as needed for allergies. ), Disp: 48 g, Rfl: 3 .  gabapentin (NEURONTIN) 300 MG capsule, Take 1 capsule (300 mg total) by mouth 3 (three) times daily., Disp: 90 capsule, Rfl: 0 .  glucose blood (CONTOUR NEXT TEST) test strip, Check blood sugars 3 times daily., Disp: 300 each, Rfl: 11 .  hydrOXYzine (ATARAX/VISTARIL) 25 MG tablet, Take 1 tablet (25 mg total) by mouth every 8 (eight) hours as needed for itching., Disp: 30 tablet, Rfl: 3 .  Ivermectin (SOOLANTRA) 1 % CREA, Apply 1 application topically daily as needed (rosacea). To  face, Disp: , Rfl:  .  lidocaine-prilocaine (EMLA) cream, Apply 1 application topically as needed (port access)., Disp: 1 g, Rfl: 3 .  loratadine (CLARITIN) 10 MG tablet, Take 10 mg by mouth daily as needed for allergies. [employer] brand allergy relief, Disp: , Rfl:  .  OVER THE COUNTER MEDICATION, Apply 1 application topically daily as needed (pain). Outback topical pain relief, Disp: , Rfl:  .  sertraline (ZOLOFT) 100 MG tablet, Take 1 tablet (100 mg total) by mouth daily., Disp: 90 tablet, Rfl: 3 .  vitamin B-12 (CYANOCOBALAMIN) 1000 MCG tablet, Take by mouth daily. Unsure dose, Disp: , Rfl:  .  vitamin C (ASCORBIC ACID) 500 MG tablet, Take 1,000-1,500 mg by mouth daily as needed (immune support)., Disp: , Rfl:  .  losartan (COZAAR) 100 MG tablet, TAKE 1 TABLET BY MOUTH ONCE DAILY (Patient taking differently: Take 100 mg by mouth at bedtime. ), Disp: 90 tablet, Rfl: 3 .  magnesium hydroxide (MILK OF MAGNESIA) 400 MG/5ML suspension, Take 15 mLs by mouth daily as needed for mild constipation., Disp: , Rfl:  .  metFORMIN (GLUCOPHAGE) 1000 MG tablet, TAKE 1 TABLET BY MOUTH TWICE DAILY WITH MEALS (Patient taking differently: Take 1,000 mg by mouth 2 (two) times a day. ), Disp: 180 tablet, Rfl: 3 .  nystatin cream (MYCOSTATIN), Apply topically 2 (two) times daily. (Patient taking differently: Apply 1 application topically 2 (two) times daily as needed (rash). ), Disp: 15 g, Rfl: 1 No current facility-administered medications for this visit.   Facility-Administered Medications Ordered in Other Visits:  .  Influenza vac split quadrivalent PF (FLUZONE HIGH-DOSE) injection 0.5 mL, 0.5 mL, Intramuscular, Once, Karen Kitchens, NP  Review of Systems  Constitutional: Positive for fatigue.  HENT: Negative.   Respiratory: Negative.   Cardiovascular: Negative.   Gastrointestinal: Positive for constipation.  Endocrine: Negative.   Genitourinary: Negative.   Allergic/Immunologic: Negative.    Psychiatric/Behavioral: Negative.     Social History   Tobacco Use  . Smoking status: Former Smoker    Types: Cigars    Quit date: 10/09/1978    Years since quitting: 40.4  . Smokeless tobacco: Former Systems developer    Types: Chew    Quit date: 12/04/1988  . Tobacco comment: on occasion  Substance Use Topics  . Alcohol use: Not Currently      Objective:   There were no vitals taken for this visit. There were no vitals filed for this visit.There is no height or weight on file to calculate BMI.   Physical Exam Vitals signs reviewed.  HENT:     Head: Normocephalic and atraumatic.     Right Ear: External ear normal.     Left Ear: External ear normal.  Eyes:     General: No scleral icterus.    Conjunctiva/sclera:  Conjunctivae normal.  Cardiovascular:     Rate and Rhythm: Normal rate and regular rhythm.  Pulmonary:     Breath sounds: Normal breath sounds.  Abdominal:     Palpations: Abdomen is soft.  Lymphadenopathy:     Cervical: No cervical adenopathy.  Skin:    General: Skin is warm and dry.  Neurological:     General: No focal deficit present.     Mental Status: He is alert and oriented to person, place, and time.  Psychiatric:        Mood and Affect: Mood normal.        Behavior: Behavior normal.        Thought Content: Thought content normal.        Judgment: Judgment normal.      Results for orders placed or performed in visit on 03/24/19  POCT glycosylated hemoglobin (Hb A1C)  Result Value Ref Range   Hemoglobin A1C 7.8 (A) 4.0 - 5.6 %   HbA1c POC (<> result, manual entry)     HbA1c, POC (prediabetic range)     HbA1c, POC (controlled diabetic range)         Assessment & Plan    1. Type 2 diabetes mellitus without complication, without long-term current use of insulin (HCC) - POCT glycosylated hemoglobin (Hb A1C)  2. Essential (primary) hypertension Stop Coreg--RTC 1 month.  3. Atherosclerosis of native coronary artery of native heart with angina pectoris  ([place])   4. Urothelial cancer ([place]) More than 50% 25 minute visit spent in counseling or coordination of care   5. Urothelial carcinoma of kidney, left (HCC)  6.constipation Try glycolax daily.     Richard Cranford Mon, MD  [facility]

## 2019-03-24 NOTE — Patient Instructions (Signed)
[name] , Thank you for taking time to come for your Medicare Wellness Visit. I appreciate your ongoing commitment to your health goals. Please review the following plan we discussed and let me know if I can assist you in the future.   Screening recommendations/referrals: Colonoscopy: Up to date, due 09/2021 Recommended yearly ophthalmology/optometry visit for glaucoma screening and checkup Recommended yearly dental visit for hygiene and checkup  Vaccinations: Influenza vaccine: Up to date Pneumococcal vaccine: Completed series Tdap vaccine: Pt declines today.  Shingles vaccine: Pt declines today.     Advanced directives: Currently on file.   Conditions/risks identified: None.   Next appointment: 2:00 PM today with Dr Rosanna Randy.   Preventive Care 75 Years and Older, Male Preventive care refers to lifestyle choices and visits with your health care provider that can promote health and wellness. What does preventive care include?  A yearly physical exam. This is also called an annual well check.  Dental exams once or twice a year.  Routine eye exams. Ask your health care provider how often you should have your eyes checked.  Personal lifestyle choices, including:  Daily care of your teeth and gums.  Regular physical activity.  Eating a healthy diet.  Avoiding tobacco and drug use.  Limiting alcohol use.  Practicing safe sex.  Taking low doses of aspirin every day.  Taking vitamin and mineral supplements as recommended by your health care provider. What happens during an annual well check? The services and screenings done by your health care provider during your annual well check will depend on your age, overall health, lifestyle risk factors, and family history of disease. Counseling  Your health care provider may ask you questions about your:  Alcohol use.  Tobacco use.  Drug use.  Emotional well-being.  Home and relationship well-being.  Sexual activity.   Eating habits.  History of falls.  Memory and ability to understand (cognition).  Work and work Statistician. Screening  You may have the following tests or measurements:  Height, weight, and BMI.  Blood pressure.  Lipid and cholesterol levels. These may be checked every 5 years, or more frequently if you are over 40 years old.  Skin check.  Lung cancer screening. You may have this screening every year starting at age 43 if you have a 30-pack-year history of smoking and currently smoke or have quit within the past 15 years.  Fecal occult blood test (FOBT) of the stool. You may have this test every year starting at age 83.  Flexible sigmoidoscopy or colonoscopy. You may have a sigmoidoscopy every 5 years or a colonoscopy every 10 years starting at age 29.  Prostate cancer screening. Recommendations will vary depending on your family history and other risks.  Hepatitis C blood test.  Hepatitis B blood test.  Sexually transmitted disease (STD) testing.  Diabetes screening. This is done by checking your blood sugar (glucose) after you have not eaten for a while (fasting). You may have this done every 1-3 years.  Abdominal aortic aneurysm (AAA) screening. You may need this if you are a current or former smoker.  Osteoporosis. You may be screened starting at age 45 if you are at high risk. Talk with your health care provider about your test results, treatment options, and if necessary, the need for more tests. Vaccines  Your health care provider may recommend certain vaccines, such as:  Influenza vaccine. This is recommended every year.  Tetanus, diphtheria, and acellular pertussis (Tdap, Td) vaccine. You may need a  Td booster every 10 years.  Zoster vaccine. You may need this after age 51.  Pneumococcal 13-valent conjugate (PCV13) vaccine. One dose is recommended after age 43.  Pneumococcal polysaccharide (PPSV23) vaccine. One dose is recommended after age 27. Talk to  your health care provider about which screenings and vaccines you need and how often you need them. This information is not intended to replace advice given to you by your health care provider. Make sure you discuss any questions you have with your health care provider. Document Released: 07/23/2015 Document Revised: 03/15/2016 Document Reviewed: 04/27/2015 Elsevier Interactive Patient Education  2017 [place] Prevention in the Home Falls can cause injuries. They can happen to people of all ages. There are many things you can do to make your home safe and to help prevent falls. What can I do on the outside of my home?  Regularly fix the edges of walkways and driveways and fix any cracks.  Remove anything that might make you trip as you walk through a door, such as a raised step or threshold.  Trim any bushes or trees on the path to your home.  Use bright outdoor lighting.  Clear any walking paths of anything that might make someone trip, such as rocks or tools.  Regularly check to see if handrails are loose or broken. Make sure that both sides of any steps have handrails.  Any raised decks and porches should have guardrails on the edges.  Have any leaves, snow, or ice cleared regularly.  Use sand or salt on walking paths during winter.  Clean up any spills in your garage right away. This includes oil or grease spills. What can I do in the bathroom?  Use night lights.  Install grab bars by the toilet and in the tub and shower. Do not use towel bars as grab bars.  Use non-skid mats or decals in the tub or shower.  If you need to sit down in the shower, use a plastic, non-slip stool.  Keep the floor dry. Clean up any water that spills on the floor as soon as it happens.  Remove soap buildup in the tub or shower regularly.  Attach bath mats securely with double-sided non-slip rug tape.  Do not have throw rugs and other things on the floor that can make you trip.  What can I do in the bedroom?  Use night lights.  Make sure that you have a light by your bed that is easy to reach.  Do not use any sheets or blankets that are too big for your bed. They should not hang down onto the floor.  Have a firm chair that has side arms. You can use this for support while you get dressed.  Do not have throw rugs and other things on the floor that can make you trip. What can I do in the kitchen?  Clean up any spills right away.  Avoid walking on wet floors.  Keep items that you use a lot in easy-to-reach places.  If you need to reach something above you, use a strong step stool that has a grab bar.  Keep electrical cords out of the way.  Do not use floor polish or wax that makes floors slippery. If you must use wax, use non-skid floor wax.  Do not have throw rugs and other things on the floor that can make you trip. What can I do with my stairs?  Do not leave any items on the stairs.  Make sure that there are handrails on both sides of the stairs and use them. Fix handrails that are broken or loose. Make sure that handrails are as long as the stairways.  Check any carpeting to make sure that it is firmly attached to the stairs. Fix any carpet that is loose or worn.  Avoid having throw rugs at the top or bottom of the stairs. If you do have throw rugs, attach them to the floor with carpet tape.  Make sure that you have a light switch at the top of the stairs and the bottom of the stairs. If you do not have them, ask someone to add them for you. What else can I do to help prevent falls?  Wear shoes that:  Do not have high heels.  Have rubber bottoms.  Are comfortable and fit you well.  Are closed at the toe. Do not wear sandals.  If you use a stepladder:  Make sure that it is fully opened. Do not climb a closed stepladder.  Make sure that both sides of the stepladder are locked into place.  Ask someone to hold it for you, if possible.   Clearly mark and make sure that you can see:  Any grab bars or handrails.  First and last steps.  Where the edge of each step is.  Use tools that help you move around (mobility aids) if they are needed. These include:  Canes.  Walkers.  Scooters.  Crutches.  Turn on the lights when you go into a dark area. Replace any light bulbs as soon as they burn out.  Set up your furniture so you have a clear path. Avoid moving your furniture around.  If any of your floors are uneven, fix them.  If there are any pets around you, be aware of where they are.  Review your medicines with your doctor. Some medicines can make you feel dizzy. This can increase your chance of falling. Ask your doctor what other things that you can do to help prevent falls. This information is not intended to replace advice given to you by your health care provider. Make sure you discuss any questions you have with your health care provider. Document Released: 04/22/2009 Document Revised: 12/02/2015 Document Reviewed: 07/31/2014 Elsevier Interactive Patient Education  2017 [employer].

## 2019-03-24 NOTE — Patient Instructions (Addendum)
Stop Carvedilol.  Try OTC Vitamin D daily.  Take Glycolax daily.

## 2019-03-25 ENCOUNTER — Encounter: Payer: Self-pay | Admitting: Oncology

## 2019-03-25 ENCOUNTER — Ambulatory Visit: Payer: Medicare Other

## 2019-03-25 ENCOUNTER — Other Ambulatory Visit: Payer: Self-pay

## 2019-03-25 ENCOUNTER — Inpatient Hospital Stay: Payer: Medicare Other

## 2019-03-25 ENCOUNTER — Ambulatory Visit: Payer: Medicare Other | Admitting: Oncology

## 2019-03-25 ENCOUNTER — Other Ambulatory Visit: Payer: Medicare Other

## 2019-03-25 ENCOUNTER — Inpatient Hospital Stay (HOSPITAL_BASED_OUTPATIENT_CLINIC_OR_DEPARTMENT_OTHER): Payer: Medicare Other | Admitting: Oncology

## 2019-03-25 VITALS — BP 103/67 | HR 65 | Temp 95.5°F | Resp 18 | Wt 151.7 lb

## 2019-03-25 DIAGNOSIS — C772 Secondary and unspecified malignant neoplasm of intra-abdominal lymph nodes: Secondary | ICD-10-CM | POA: Diagnosis not present

## 2019-03-25 DIAGNOSIS — I25119 Atherosclerotic heart disease of native coronary artery with unspecified angina pectoris: Secondary | ICD-10-CM

## 2019-03-25 DIAGNOSIS — C642 Malignant neoplasm of left kidney, except renal pelvis: Secondary | ICD-10-CM

## 2019-03-25 DIAGNOSIS — Z5111 Encounter for antineoplastic chemotherapy: Secondary | ICD-10-CM | POA: Diagnosis not present

## 2019-03-25 DIAGNOSIS — Z95828 Presence of other vascular implants and grafts: Secondary | ICD-10-CM

## 2019-03-25 DIAGNOSIS — D701 Agranulocytosis secondary to cancer chemotherapy: Secondary | ICD-10-CM

## 2019-03-25 DIAGNOSIS — T451X5A Adverse effect of antineoplastic and immunosuppressive drugs, initial encounter: Secondary | ICD-10-CM

## 2019-03-25 DIAGNOSIS — C652 Malignant neoplasm of left renal pelvis: Secondary | ICD-10-CM | POA: Diagnosis not present

## 2019-03-25 DIAGNOSIS — Z5112 Encounter for antineoplastic immunotherapy: Secondary | ICD-10-CM | POA: Diagnosis not present

## 2019-03-25 LAB — CBC WITH DIFFERENTIAL/PLATELET
Abs Immature Granulocytes: 0.03 10*3/uL (ref 0.00–0.07)
Basophils Absolute: 0 10*3/uL (ref 0.0–0.1)
Basophils Relative: 1 %
Eosinophils Absolute: 0.2 10*3/uL (ref 0.0–0.5)
Eosinophils Relative: 3 %
HCT: 31.2 % — ABNORMAL LOW (ref 39.0–52.0)
Hemoglobin: 10.3 g/dL — ABNORMAL LOW (ref 13.0–17.0)
Immature Granulocytes: 1 %
Lymphocytes Relative: 22 %
Lymphs Abs: 1.4 10*3/uL (ref 0.7–4.0)
MCH: 28.9 pg (ref 26.0–34.0)
MCHC: 33 g/dL (ref 30.0–36.0)
MCV: 87.4 fL (ref 80.0–100.0)
Monocytes Absolute: 0.7 10*3/uL (ref 0.1–1.0)
Monocytes Relative: 11 %
Neutro Abs: 4.1 10*3/uL (ref 1.7–7.7)
Neutrophils Relative %: 62 %
Platelets: 233 10*3/uL (ref 150–400)
RBC: 3.57 MIL/uL — ABNORMAL LOW (ref 4.22–5.81)
RDW: 15.3 % (ref 11.5–15.5)
WBC: 6.5 10*3/uL (ref 4.0–10.5)
nRBC: 0 % (ref 0.0–0.2)

## 2019-03-25 LAB — COMPREHENSIVE METABOLIC PANEL
ALT: 15 U/L (ref 0–44)
AST: 16 U/L (ref 15–41)
Albumin: 3.3 g/dL — ABNORMAL LOW (ref 3.5–5.0)
Alkaline Phosphatase: 63 U/L (ref 38–126)
Anion gap: 9 (ref 5–15)
BUN: 18 mg/dL (ref 8–23)
CO2: 27 mmol/L (ref 22–32)
Calcium: 9.2 mg/dL (ref 8.9–10.3)
Chloride: 98 mmol/L (ref 98–111)
Creatinine, Ser: 0.68 mg/dL (ref 0.61–1.24)
GFR calc Af Amer: >60 mL/min (ref >60)
GFR calc non Af Amer: >60 mL/min (ref >60)
Glucose, Bld: 232 mg/dL — ABNORMAL HIGH (ref 70–99)
Potassium: 4.1 mmol/L (ref 3.5–5.1)
Sodium: 134 mmol/L — ABNORMAL LOW (ref 135–145)
Total Bilirubin: 0.8 mg/dL (ref 0.3–1.2)
Total Protein: 6.9 g/dL (ref 6.5–8.1)

## 2019-03-25 MED ORDER — SODIUM CHLORIDE 0.9% FLUSH
10.0000 mL | Freq: Once | INTRAVENOUS | Status: AC
  Administered 2019-03-25: 10 mL via INTRAVENOUS
  Filled 2019-03-25: qty 10

## 2019-03-25 MED ORDER — PALONOSETRON HCL INJECTION 0.25 MG/5ML
0.2500 mg | Freq: Once | INTRAVENOUS | Status: AC
  Administered 2019-03-25: 0.25 mg via INTRAVENOUS
  Filled 2019-03-25: qty 5

## 2019-03-25 MED ORDER — SODIUM CHLORIDE 0.9 % IV SOLN
Freq: Once | INTRAVENOUS | Status: AC
  Administered 2019-03-25: 12:00:00 via INTRAVENOUS
  Filled 2019-03-25: qty 250

## 2019-03-25 MED ORDER — HEPARIN SOD (PORK) LOCK FLUSH 100 UNIT/ML IV SOLN
500.0000 [IU] | Freq: Once | INTRAVENOUS | Status: AC | PRN
  Administered 2019-03-25: 500 [IU]
  Filled 2019-03-25: qty 5

## 2019-03-25 MED ORDER — SODIUM CHLORIDE 0.9 % IV SOLN
1.0000 mg/kg | Freq: Once | INTRAVENOUS | Status: AC
  Administered 2019-03-25: 70 mg via INTRAVENOUS
  Filled 2019-03-25: qty 7

## 2019-03-25 MED ORDER — DEXAMETHASONE SODIUM PHOSPHATE 10 MG/ML IJ SOLN
10.0000 mg | Freq: Once | INTRAMUSCULAR | Status: AC
  Administered 2019-03-25: 10 mg via INTRAVENOUS
  Filled 2019-03-25: qty 1

## 2019-03-25 NOTE — Progress Notes (Signed)
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 [name] so far for his metastatic urothelial carcinoma. This was originally diagnosed in May 2019. He was noted to have a filling defect in the lower pole collecting system of the left kidney along with para-aortic and retroperitoneal adenopathy concerning for metastatic disease. He underwent left ureteroscopy and renal pelvis biopsy which revealed small fragments of high-grade urothelial carcinoma with small focus of invasion. He was started on carboplatin and gemcitabine chemotherapy in June 2019 and was continued on 05/27/2018 for 8 cycles. He tolerated chemotherapy well except for chemo-induced anemia for which she has been getting Procrit every 2 weeks. He did have response to his disease based on scans in August 2019. However repeat scan on 05/31/2018 showed increase in the size of the primary tumor from 1.2 to 1.9 cm and increase in left para-aortic adenopathy from 0.9 to 1.3 cm. Periportal adenopathy was stable at 1.4 cm. Second line immunotherapy was recommended. His initial biopsy specimen did not have enough sample to undergo FGFR mutation testing. He also has mild interstitial lung disease for  which he sees pulmonary but he is not on home oxygen. He has B12 deficiency for which he is on oral B12. Also has diabetes and coronary artery disease.Tecentriq started on 06/17/2018.Patient noted to have disease progression in his lymph nodes in May 2020. Repeat biopsy showed metastatic urothelial carcinoma which did not have aFGFR mutation. He has been started on third line Padcev  Interval history-patient is currently taking gabapentin 300 mg 3 times a day but finds it difficult to take it 3 times a day.  He still has symptoms of neuropathy in his feet which have overall remained stable.  Patient reports having occasional chest wall pain 1 to 2 days after getting Padcev which eventually resolves.  He has chronic fatigue  ECOG PS- 1 Pain scale- 0  Review of systems- Review of Systems  Constitutional: Positive for malaise/fatigue. Negative for chills, fever and weight loss.  HENT: Negative for congestion, ear discharge and nosebleeds.   Eyes: Negative for blurred vision.  Respiratory: Negative for cough, hemoptysis, sputum production, shortness of breath and wheezing.   Cardiovascular: Negative for chest pain, palpitations, orthopnea and claudication.  Gastrointestinal: Negative for abdominal pain, blood in stool, constipation, diarrhea, heartburn, melena, nausea and vomiting.  Genitourinary: Negative for dysuria, flank pain, frequency, hematuria and urgency.  Musculoskeletal: Negative for back pain, joint pain and myalgias.  Skin: Negative for rash.  Neurological: Positive for sensory change (Peripheral neuropathy). Negative for dizziness, tingling, focal weakness, seizures, weakness and headaches.  Endo/Heme/Allergies: Does not bruise/bleed easily.  Psychiatric/Behavioral: Negative for depression and suicidal ideas. The patient does not have insomnia.        Allergies  Allergen Reactions   Sulfa Antibiotics Other (See Comments)    Joint pain   Ace Inhibitors Cough  Invokana [Canagliflozin] Other (See Comments)    Leg pain   Penicillins Rash    Did it involve swelling of the face/tongue/throat, SOB, or low BP? No Did it involve sudden or severe rash/hives, skin peeling, or any reaction on the inside of your mouth or nose? Yes Did you need to seek medical attention at a hospital or doctor's office? Yes When did it last happen?childhood allergy If all above answers are "NO", may proceed with cephalosporin use.      Past Medical History:  Diagnosis Date   Acid reflux    Anxiety    Depression    Diabetes mellitus without complication ([place])    Hyperlipidemia    Hypertension    MRSA (methicillin resistant Staphylococcus aureus) infection 1992   history   Myocardial infarction ([place]) 1995   Sleep apnea    CPAP   Urothelial cancer ([place]) 11/2017   Left Urothelial mass, chemo tx's     Past Surgical History:  Procedure Laterality Date   CATARACT EXTRACTION Left    COLONOSCOPY  2010   [facility]   COLONOSCOPY WITH PROPOFOL N/A 10/04/2016   Procedure: COLONOSCOPY WITH PROPOFOL;  Surgeon: [name], MD;  Location: [place];  Service: Endoscopy;  Laterality: N/A;   [place], 2000, 2001, 2014   CYSTOSCOPY W/ RETROGRADES Bilateral 12/10/2017   Procedure: CYSTOSCOPY WITH RETROGRADE PYELOGRAM;  Surgeon: [name], MD;  Location: [facility];  Service: Urology;  Laterality: Bilateral;   CYSTOSCOPY W/ RETROGRADES Left 12/09/2018   Procedure: CYSTOSCOPY WITH RETROGRADE PYELOGRAM;  Surgeon: [name], MD;  Location: [facility];  Service: Urology;  Laterality: Left;   CYSTOSCOPY WITH BIOPSY Left 12/09/2018   Procedure: CYSTOSCOPY WITH URETERAL/RENAL PELVIC BIOPSY;  Surgeon: [name], MD;  Location: [facility];  Service: Urology;  Laterality: Left;   CYSTOSCOPY WITH STENT PLACEMENT Left 12/10/2017   Procedure: CYSTOSCOPY WITH STENT PLACEMENT;  Surgeon: [name], MD;   Location: [facility];  Service: Urology;  Laterality: Left;   CYSTOSCOPY WITH STENT PLACEMENT Left 12/09/2018   Procedure: CYSTOSCOPY WITH STENT PLACEMENT;  Surgeon: [name], MD;  Location: [facility];  Service: Urology;  Laterality: Left;   CYSTOSCOPY WITH URETEROSCOPY Left 12/09/2018   Procedure: CYSTOSCOPY WITH URETEROSCOPY;  Surgeon: [name], MD;  Location: [facility];  Service: Urology;  Laterality: Left;   EYE SURGERY Bilateral    cataract   INGUINAL HERNIA REPAIR Right 08/09/2015   Procedure: HERNIA REPAIR INGUINAL ADULT;  Surgeon: [name], MD;  Location: [facility];  Service: General;  Laterality: Right;   NASAL SINUS SURGERY     nuclear stress test     PORTA CATH INSERTION N/A 12/19/2017   Procedure: PORTA CATH INSERTION;  Surgeon: [name], MD;  Location: [facility];  Service: Cardiovascular;  Laterality: N/A;   URETERAL BIOPSY Left 12/10/2017   Procedure: URETERAL & renal PELVIS BIOPSY;  Surgeon: [name], MD;  Location: [facility];  Service: Urology;  Laterality: Left;   URETEROSCOPY Left 12/10/2017   Procedure: URETEROSCOPY;  Surgeon: [name], MD;  Location: [facility];  Service: Urology;  Laterality: Left;    Social History   Socioeconomic History   Marital status: Married    Spouse name: [name]   Number of children: 1   Years of education: Not on file   Highest education level: Associate degree: occupational, Hotel manager, or vocational program  Occupational History   Occupation: retired  Wellsite geologist  resource strain: Not hard at all   Food insecurity    Worry: Never true    Inability: Never true   Transportation needs    Medical: No    Non-medical: No  Tobacco Use   Smoking status: Former Smoker    Types: Cigars    Quit date: 10/09/1978    Years since quitting: 40.4   Smokeless tobacco: Former Systems developer    Types: Chew    Quit date: 12/04/1988   Tobacco comment: on occasion  Substance and Sexual Activity    Alcohol use: Not Currently   Drug use: No   Sexual activity: Not Currently  Lifestyle   Physical activity    Days per week: 0 days    Minutes per session: 0 min   Stress: Not at all  Relationships   Social connections    Talks on phone: Patient refused    Gets together: Patient refused    Attends religious service: Patient refused    Active member of club or organization: Patient refused    Attends meetings of clubs or organizations: Patient refused    Relationship status: Patient refused   Intimate partner violence    Fear of current or ex partner: Patient refused    Emotionally abused: Patient refused    Physically abused: Patient refused    Forced sexual activity: Patient refused  Other Topics Concern   Not on file  Social History Narrative   Not on file    Family History  Problem Relation Age of Onset   Heart disease Mother    Cancer Father        Lung and colon cancer   Heart disease Father    Emphysema Maternal Grandfather    Tuberculosis Maternal Grandmother      Current Outpatient Medications:    aspirin EC 81 MG tablet, Take 81 mg by mouth every evening. , Disp: , Rfl:    atorvastatin (LIPITOR) 40 MG tablet, Take 1 tablet (40 mg total) by mouth at bedtime., Disp: 90 tablet, Rfl: 3   Cholecalciferol (VITAMIN D3) 75 MCG (3000 UT) TABS, Take by mouth., Disp: , Rfl:    clobetasol cream (TEMOVATE) [id number] %, Apply 1 application topically as needed (for skin rash)., Disp: 30 g, Rfl: 1   empagliflozin (JARDIANCE) 10 MG TABS tablet, Take 10 mg by mouth daily. (Patient taking differently: Take 10 mg by mouth daily as needed (elevated blood sugar). ), Disp: 90 tablet, Rfl: 3   fluticasone (FLONASE) 50 MCG/ACT nasal spray, Use 2 spray(s) in each nostril once daily (Patient taking differently: Place 2 sprays into both nostrils daily as needed for allergies. ), Disp: 48 g, Rfl: 3   gabapentin (NEURONTIN) 300 MG capsule, Take 1 capsule (300 mg total) by  mouth 3 (three) times daily., Disp: 90 capsule, Rfl: 0   glucose blood (CONTOUR NEXT TEST) test strip, Check blood sugars 3 times daily., Disp: 300 each, Rfl: 11   hydrOXYzine (ATARAX/VISTARIL) 25 MG tablet, Take 1 tablet (25 mg total) by mouth every 8 (eight) hours as needed for itching., Disp: 30 tablet, Rfl: 3   Ivermectin (SOOLANTRA) 1 % CREA, Apply 1 application topically daily as needed (rosacea). To face, Disp: , Rfl:    lidocaine-prilocaine (EMLA) cream, Apply 1 application topically as needed (port access)., Disp: 1 g, Rfl: 3   loratadine (CLARITIN) 10 MG tablet, Take 10 mg by mouth daily as needed for allergies. [employer] brand allergy relief, Disp: , Rfl:    losartan (COZAAR) 100  MG tablet, TAKE 1 TABLET BY MOUTH ONCE DAILY, Disp: 90 tablet, Rfl: 3   magnesium hydroxide (MILK OF MAGNESIA) 400 MG/5ML suspension, Take 15 mLs by mouth daily as needed for mild constipation., Disp: , Rfl:    metFORMIN (GLUCOPHAGE) 1000 MG tablet, TAKE 1 TABLET BY MOUTH TWICE DAILY WITH MEALS (Patient taking differently: Take 1,000 mg by mouth 2 (two) times a day. ), Disp: 180 tablet, Rfl: 3   nystatin cream (MYCOSTATIN), Apply topically 2 (two) times daily. (Patient taking differently: Apply 1 application topically 2 (two) times daily as needed (rash). ), Disp: 15 g, Rfl: 1   OVER THE COUNTER MEDICATION, Apply 1 application topically daily as needed (pain). Outback topical pain relief, Disp: , Rfl:    sertraline (ZOLOFT) 100 MG tablet, Take 1 tablet (100 mg total) by mouth daily., Disp: 90 tablet, Rfl: 3   vitamin B-12 (CYANOCOBALAMIN) 1000 MCG tablet, Take by mouth daily. Unsure dose, Disp: , Rfl:    vitamin C (ASCORBIC ACID) 500 MG tablet, Take 1,000-1,500 mg by mouth daily as needed (immune support)., Disp: , Rfl:  No current facility-administered medications for this visit.   Facility-Administered Medications Ordered in Other Visits:    Influenza vac split quadrivalent PF (FLUZONE HIGH-DOSE)  injection 0.5 mL, 0.5 mL, Intramuscular, Once, Karen Kitchens, NP  Physical exam:  Vitals:   03/25/19 1043  BP: 103/67  Pulse: 65  Resp: 18  Temp: (!) 95.5 F (35.3 C)  TempSrc: Tympanic  SpO2: 99%  Weight: 151 lb 11.2 oz (68.8 kg)   Physical Exam Constitutional:      Comments: Thin elderly gentleman who appears fatigued.  Appears in no acute distress  HENT:     Head: Normocephalic and atraumatic.  Eyes:     Pupils: Pupils are equal, round, and reactive to light.  Neck:     Musculoskeletal: Normal range of motion.  Cardiovascular:     Rate and Rhythm: Normal rate and regular rhythm.     Heart sounds: Normal heart sounds.  Pulmonary:     Effort: Pulmonary effort is normal.     Breath sounds: Normal breath sounds.  Abdominal:     General: Bowel sounds are normal.     Palpations: Abdomen is soft.  Skin:    General: Skin is warm and dry.  Neurological:     Mental Status: He is alert and oriented to person, place, and time.      CMP Latest Ref Rng & Units 03/25/2019  Glucose 70 - 99 mg/dL 232(H)  BUN 8 - 23 mg/dL 18  Creatinine 0.61 - 1.24 mg/dL 0.68  Sodium 135 - 145 mmol/L 134(L)  Potassium 3.5 - 5.1 mmol/L 4.1  Chloride 98 - 111 mmol/L 98  CO2 22 - 32 mmol/L 27  Calcium 8.9 - 10.3 mg/dL 9.2  Total Protein 6.5 - 8.1 g/dL 6.9  Total Bilirubin 0.3 - 1.2 mg/dL 0.8  Alkaline Phos 38 - 126 U/L 63  AST 15 - 41 U/L 16  ALT 0 - 44 U/L 15   CBC Latest Ref Rng & Units 03/25/2019  WBC 4.0 - 10.5 K/uL 6.5  Hemoglobin 13.0 - 17.0 g/dL 10.3(L)  Hematocrit 39.0 - 52.0 % 31.2(L)  Platelets 150 - 400 K/uL 233    No images are attached to the encounter.  Ct Angio Chest Pe W And/or Wo Contrast  Result Date: 03/03/2019 CLINICAL DATA:  Chest pain history of bladder cancer EXAM: CT ANGIOGRAPHY CHEST WITH CONTRAST TECHNIQUE: Multidetector CT imaging  of the chest was performed using the standard protocol during bolus administration of intravenous contrast. Multiplanar CT image  reconstructions and MIPs were obtained to evaluate the vascular anatomy. CONTRAST:  60mL OMNIPAQUE IOHEXOL 350 MG/ML SOLN COMPARISON:  Chest x-ray 03/03/2019, CT chest 11/25/2018 FINDINGS: Cardiovascular: Satisfactory opacification of the pulmonary arteries to the segmental level. No evidence of pulmonary embolism. Nonaneurysmal aorta. No dissection is seen. Mild aortic atherosclerosis. Coronary vascular calcification. Borderline cardiomegaly. Right-sided central venous port tip in the proximal right atrium. No pericardial effusion Mediastinum/Nodes: Midline trachea. No thyroid mass. Stable 12 mm right precarinal lymph node. Small hiatal hernia. Lungs/Pleura: No consolidation, pleural effusion or pneumothorax. Bilateral septal thickening with mild hazy density at the lower lobes. Upper Abdomen: No acute abnormality Musculoskeletal: No acute or suspicious osseous abnormality Review of the MIP images confirms the above findings. IMPRESSION: 1. Negative for acute pulmonary embolus or aortic dissection 2. Bilateral interstitial thickening, suspect for interstitial lung disease. Scattered hazy bilateral lower lobe densities, probable atelectasis. Aortic Atherosclerosis (ICD10-I70.0). Electronically Signed   By: Donavan Foil M.D.   On: 03/03/2019 22:22   Dg Chest Portable 1 View  Result Date: 03/03/2019 CLINICAL DATA:  Chest pain, stage IV bladder cancer EXAM: PORTABLE CHEST 1 VIEW COMPARISON:  CT chest Nov 24, 2020 FINDINGS: Twenty right IJ approach Port-A-Cath tip terminates in the right atrium. Multiple cardiac monitoring leads overlie the chest. Coarse interstitial opacities are again seen throughout the lungs. No consolidation, features of edema, pneumothorax, or effusion. Pulmonary vascularity is normally distributed. The aorta is calcified and tortuous. The remaining cardiomediastinal contours are unremarkable. No acute osseous or soft tissue abnormality. Degenerative changes are present in the imaged spine  and shoulders. IMPRESSION: 1. Right IJ approach Port-A-Cath tip terminates in the right atrium. 2. Coarse interstitial opacities throughout the lungs, unchanged. 3. No acute cardiopulmonary abnormality. Electronically Signed   By: Lovena Le M.D.   On: 03/03/2019 20:59     Assessment and plan- Patient is a 73 y.o. male with history of metastatic urothelial carcinoma with metastases to the lymph nodes.He had disease progression on carboplatin/gemcitabine as well as second line Tecentriq.  He is here for on treatment assessment prior to cycle 3-day 15 of padcev  Counts okay to proceed with cycle 3-day 15 of Padcev today.  Hemoglobin is remained stable around 10.  LFTs are normal although blood sugars are mildly elevated at 232.  I will see him back in 2 weeks time for cycle 4-day 1.  We will obtain CT chest abdomen pelvis with contrast prior.  Chemo-induced peripheral neuropathy: I advised him to take gabapentin 600 twice a day instead of 300 3 times daily.  Continue to monitor  Normocytic anemia: Likely secondary to malignancy.  Stable continue to monitor Visit Diagnosis 1. Urothelial carcinoma of kidney, left ([place])   2. Encounter for antineoplastic chemotherapy      Dr. Randa Evens, MD, MPH [facility] at [facility] [id number] 03/25/2019 2:03 PM

## 2019-03-25 NOTE — Progress Notes (Signed)
Pt in for follow up, reports having a rough week, fell multiple times.  States Thursday and Friday and worse days after treatment.

## 2019-03-26 ENCOUNTER — Other Ambulatory Visit: Payer: Self-pay | Admitting: *Deleted

## 2019-03-27 ENCOUNTER — Telehealth: Payer: Self-pay

## 2019-03-27 ENCOUNTER — Encounter: Payer: Self-pay | Admitting: Oncology

## 2019-03-27 NOTE — Telephone Encounter (Signed)
Telephone call to patient to schedule palliative care visit with patient. Patient/family in agreement with home visit on 03-31-19 at 2:00 PM.

## 2019-03-31 ENCOUNTER — Other Ambulatory Visit: Payer: Self-pay

## 2019-03-31 ENCOUNTER — Other Ambulatory Visit: Payer: Medicare Other

## 2019-03-31 ENCOUNTER — Ambulatory Visit
Admission: RE | Admit: 2019-03-31 | Discharge: 2019-03-31 | Disposition: A | Discharge status: Home / Self Care | Payer: Medicare Other | Source: Ambulatory Visit | Attending: Oncology | Admitting: Oncology

## 2019-03-31 ENCOUNTER — Telehealth: Payer: Self-pay | Admitting: *Deleted

## 2019-03-31 DIAGNOSIS — G620 Drug-induced polyneuropathy: Secondary | ICD-10-CM | POA: Diagnosis not present

## 2019-03-31 DIAGNOSIS — K8020 Calculus of gallbladder without cholecystitis without obstruction: Secondary | ICD-10-CM | POA: Diagnosis not present

## 2019-03-31 DIAGNOSIS — C679 Malignant neoplasm of bladder, unspecified: Secondary | ICD-10-CM | POA: Diagnosis not present

## 2019-03-31 DIAGNOSIS — T451X5A Adverse effect of antineoplastic and immunosuppressive drugs, initial encounter: Secondary | ICD-10-CM | POA: Insufficient documentation

## 2019-03-31 DIAGNOSIS — C642 Malignant neoplasm of left kidney, except renal pelvis: Secondary | ICD-10-CM | POA: Insufficient documentation

## 2019-03-31 DIAGNOSIS — Z515 Encounter for palliative care: Secondary | ICD-10-CM

## 2019-03-31 MED ORDER — IOHEXOL 300 MG/ML  SOLN
80.0000 mL | Freq: Once | INTRAMUSCULAR | Status: AC | PRN
  Administered 2019-03-31: 10:00:00 80 mL via INTRAVENOUS

## 2019-03-31 NOTE — Progress Notes (Signed)
COMMUNITY PALLIATIVE CARE SW NOTE  PATIENT NAME: Patrick Mcgee DOB: Jan 28, 1946 MRN: [id number]  PRIMARY CARE PROVIDER: Jerrol Banana., MD  RESPONSIBLE PARTY:  Acct ID - Guarantor Home Phone Work Phone Relationship Acct Type  [id number] ARLANDO, PHARES [phone]  Self P/F     [address], [place], [place] [address]     PLAN OF CARE and INTERVENTIONS:             1. GOALS OF CARE/ ADVANCE CARE PLANNING:  Patient's goal is to continue with treatment at this time and maintain quality of life.Patientremains aFULL CODE. SW encouraged ongoing discussion of patient's wishes. 2. SOCIAL/EMOTIONAL/SPIRITUAL ASSESSMENT/ INTERVENTIONS:  SW and RN completed home visit with patient and Patrick Mcgee (patient's wife). Patient was sitting up in a chair in the living room. Patient reports doing "okay". Patient continues to struggle with fatigue. Patient has had increased weakness, noted three different falls within the past week. No injuries. RN suggested PT referral for safety and balance, will follow-up with MD. Patient struggles with appetite and was concerned with his high blood glucose levels. Team discussed the importance of maintaining a balanced diet and drinking plenty of water.  3. PATIENT/CAREGIVER EDUCATION/ COPING:  Patient was alert, engaged. Patient and Patrick Mcgee are hopeful. Patient is coping the best that he can, focused on praying and his faith in God. Patient states "I try to find the positive in it all". Patient also has a very supportive family.   4. PERSONAL EMERGENCY PLAN:  Patient and family will call 9-1-1 for emergencies. 5. COMMUNITY RESOURCES COORDINATION/ HEALTH CARE NAVIGATION:  Patient has follow-up with MD at the cancer center on Tuesday, 9/29. SW messaged Altha Harm, NP to discuss consult. Patrick Mcgee helps coordinate patient's care.  6. FINANCIAL/LEGAL CONCERNS/INTERVENTIONS:  None.  SOCIAL HX:  Social History   Tobacco Use  . Smoking status: Former Smoker    Types: Cigars    Quit date: 10/09/1978    Years since quitting: 40.5  . Smokeless tobacco: Former Systems developer    Types: Chew    Quit date: 12/04/1988  . Tobacco comment: on occasion  Substance Use Topics  . Alcohol use: Not Currently    CODE STATUS:   Code Status: Prior (FULL CODE) ADVANCED DIRECTIVES: N MOST FORM COMPLETE:  No. HOSPICE EDUCATION PROVIDED: None.  PPS: Patient is independent of ADLs, but does need standby assist. Discussed DME, patient trying to use a cane.  I spent29minutes with patient/family, from2:00-3:00pproviding education, support and consultation.   Margaretmary Lombard, LCSW

## 2019-03-31 NOTE — Progress Notes (Signed)
PATIENT NAME: Patrick Mcgee DOB: 1945-12-16 MRN: [id number]  PRIMARY CARE PROVIDER: Jerrol Banana., MD  RESPONSIBLE PARTY:  Acct ID - Guarantor Home Phone Work Phone Relationship Acct Type  [id number] RADCLIFFE, [facility] [phone]  Self P/F     [address], [place], [place] [address]    PLAN OF CARE and INTERVENTIONS:               1.  GOALS OF CARE/ ADVANCE CARE PLANNING:  Remain at home with wife and continue to receive treatment.                 2.  PATIENT/CAREGIVER EDUCATION:  Education on s/s of decline, education on fall precautions, education on blood sugars.                3.  DISEASE STATUS:SW and RN made joint visit to patient's home.  Patient sitting in chair in living room. Patient's wife [name] and daughter [name] present for visit. Patient reports he is having increasing numbness in his hands and feet. Patients reports he knows numbness is side effect of current cancer treatment he is receiving. Patient reports he has suffered 3 or 4 falls recently. Social worker and RN talk with patient and family about asking Dr. Janese Banks for PT referral for strengthening, home assessment and assess for DME needs. Patient reports he has not had "any real pain." Wife reports patient was hospitalized for two days the end of August for pain across his upper chest. Patient felt he was having a heart attack and was admitted for observation. Patient reports he was informed they thought he had inflammation. Nurse called Dr. Elroy Channel office and left message requesting PT referral for patient. Wife reports patients weight in May was 162 lbs, patients current weight is 145.6 lbs. Patient also concerned about his blood sugars.  Patient reports blood sugars have been over 450. Education to patient this is likely due to cancer treatment.  Patient reports he receives steroids prior to receiving treatment. Education to patient that steroids can increase blood sugars significantly. Patient is no longer driving and wife  takes patient to MD appointments. Patient also continues to sleep approximately 16 out of 24 hours. Patient states he had scan this morning with contrast. Wife states they will get results next week. Patient unsure if tumor has increased in size if he will continue treatments. Patient states he wants quality over quantity of life. Support provided to patient and family. Patient and family verbalize appreciation for palliative visit. Education to patient to eat foods that he wants and not focus on blood sugars as disease and steroids are increasing blood sugars. Patient and family encouraged to contact palliative care with questions or concerns.      HISTORY OF PRESENT ILLNESS:  Patient is a 73 year old patient with urothelial carcinoma.  Patient currently receiving treatments.   CODE STATUS: Full Code   ADVANCED DIRECTIVES: N MOST FORM: No PPS: 50%   PHYSICAL EXAM:   VITALS: See Vital signs  LUNGS: clear to auscultation  CARDIAC: Cor RRR  EXTREMITIES: Trace edema SKIN: Skin color, texture, turgor normal. No rashes or lesions  NEURO: positive for dizziness, gait problems and weakness       Nilda Simmer, RN

## 2019-03-31 NOTE — Telephone Encounter (Signed)
Call from [place] with hospice reporting that patient has fallen several times and she is asking for a referral for PT for Evaluate and strengthening as well as home assessment for DME. Please return her call [phone]

## 2019-03-31 NOTE — Telephone Encounter (Signed)
Called vergie back with the phone number given and the messages are full and can't leave any messages. Will call back in am

## 2019-04-01 ENCOUNTER — Telehealth: Payer: Self-pay

## 2019-04-01 NOTE — Telephone Encounter (Signed)
Telephone call from [place] a the [facility].  Judeen Hammans reports Dr Janese Banks is in agreement with PT referral for patient.  Judeen Hammans to send order to [facility] for PT.  RN called patient's wife [name] and informed wife MD was making PT referral.

## 2019-04-02 DIAGNOSIS — E119 Type 2 diabetes mellitus without complications: Secondary | ICD-10-CM | POA: Diagnosis not present

## 2019-04-02 DIAGNOSIS — H26491 Other secondary cataract, right eye: Secondary | ICD-10-CM | POA: Diagnosis not present

## 2019-04-02 LAB — HM DIABETES EYE EXAM

## 2019-04-03 NOTE — Telephone Encounter (Signed)
I called vergie back the next day which was 9/23 and filled out form to [facility] for PT services and spoke to [place] and he will make sure it gets done

## 2019-04-06 ENCOUNTER — Other Ambulatory Visit: Payer: Self-pay | Admitting: Oncology

## 2019-04-06 ENCOUNTER — Other Ambulatory Visit: Payer: Self-pay | Admitting: Family Medicine

## 2019-04-07 ENCOUNTER — Other Ambulatory Visit: Payer: Self-pay | Admitting: *Deleted

## 2019-04-07 ENCOUNTER — Telehealth: Payer: Self-pay | Admitting: *Deleted

## 2019-04-07 DIAGNOSIS — I2510 Atherosclerotic heart disease of native coronary artery without angina pectoris: Secondary | ICD-10-CM | POA: Diagnosis not present

## 2019-04-07 DIAGNOSIS — F419 Anxiety disorder, unspecified: Secondary | ICD-10-CM | POA: Diagnosis not present

## 2019-04-07 DIAGNOSIS — E785 Hyperlipidemia, unspecified: Secondary | ICD-10-CM | POA: Diagnosis not present

## 2019-04-07 DIAGNOSIS — I10 Essential (primary) hypertension: Secondary | ICD-10-CM | POA: Diagnosis not present

## 2019-04-07 DIAGNOSIS — R530 Neoplastic (malignant) related fatigue: Secondary | ICD-10-CM | POA: Diagnosis not present

## 2019-04-07 DIAGNOSIS — D642 Secondary sideroblastic anemia due to drugs and toxins: Secondary | ICD-10-CM | POA: Diagnosis not present

## 2019-04-07 DIAGNOSIS — Z79899 Other long term (current) drug therapy: Secondary | ICD-10-CM | POA: Diagnosis not present

## 2019-04-07 DIAGNOSIS — J849 Interstitial pulmonary disease, unspecified: Secondary | ICD-10-CM | POA: Diagnosis not present

## 2019-04-07 DIAGNOSIS — E119 Type 2 diabetes mellitus without complications: Secondary | ICD-10-CM | POA: Diagnosis not present

## 2019-04-07 DIAGNOSIS — D6481 Anemia due to antineoplastic chemotherapy: Secondary | ICD-10-CM | POA: Diagnosis not present

## 2019-04-07 DIAGNOSIS — C772 Secondary and unspecified malignant neoplasm of intra-abdominal lymph nodes: Secondary | ICD-10-CM | POA: Diagnosis not present

## 2019-04-07 DIAGNOSIS — F329 Major depressive disorder, single episode, unspecified: Secondary | ICD-10-CM | POA: Diagnosis not present

## 2019-04-07 DIAGNOSIS — G4730 Sleep apnea, unspecified: Secondary | ICD-10-CM | POA: Diagnosis not present

## 2019-04-07 DIAGNOSIS — Z8614 Personal history of Methicillin resistant Staphylococcus aureus infection: Secondary | ICD-10-CM | POA: Diagnosis not present

## 2019-04-07 DIAGNOSIS — T451X5D Adverse effect of antineoplastic and immunosuppressive drugs, subsequent encounter: Secondary | ICD-10-CM | POA: Diagnosis not present

## 2019-04-07 DIAGNOSIS — Z7982 Long term (current) use of aspirin: Secondary | ICD-10-CM | POA: Diagnosis not present

## 2019-04-07 DIAGNOSIS — K219 Gastro-esophageal reflux disease without esophagitis: Secondary | ICD-10-CM | POA: Diagnosis not present

## 2019-04-07 DIAGNOSIS — Z9181 History of falling: Secondary | ICD-10-CM | POA: Diagnosis not present

## 2019-04-07 DIAGNOSIS — G620 Drug-induced polyneuropathy: Secondary | ICD-10-CM | POA: Diagnosis not present

## 2019-04-07 DIAGNOSIS — Z7984 Long term (current) use of oral hypoglycemic drugs: Secondary | ICD-10-CM | POA: Diagnosis not present

## 2019-04-07 DIAGNOSIS — Z87891 Personal history of nicotine dependence: Secondary | ICD-10-CM | POA: Diagnosis not present

## 2019-04-07 DIAGNOSIS — Z955 Presence of coronary angioplasty implant and graft: Secondary | ICD-10-CM | POA: Diagnosis not present

## 2019-04-07 MED ORDER — MAGIC MOUTHWASH W/LIDOCAINE: 5.0000 mL | Freq: Four times a day (QID) | ORAL | 3 refills | Status: DC | PRN

## 2019-04-07 NOTE — Telephone Encounter (Addendum)
Erline Levine with [facility] asking for order approval for PT orders for 2 week 3, 1 week 4, Also reports that patient has a 0 muscle tone in his right foot which is causing him to fall. She reports that he has mouth sores and is asking that we send in prescription for mouthwash. Please advise

## 2019-04-07 NOTE — Progress Notes (Signed)
Patient is coming in for follow up, he is doing PT now and this seems to be helping. They have questions about his recent scans.     [name] [phone] please call during visit.

## 2019-04-08 ENCOUNTER — Encounter: Payer: Self-pay | Admitting: Oncology

## 2019-04-08 ENCOUNTER — Inpatient Hospital Stay: Payer: Medicare Other

## 2019-04-08 ENCOUNTER — Inpatient Hospital Stay (HOSPITAL_BASED_OUTPATIENT_CLINIC_OR_DEPARTMENT_OTHER): Payer: Medicare Other | Admitting: Oncology

## 2019-04-08 ENCOUNTER — Other Ambulatory Visit: Payer: Self-pay

## 2019-04-08 VITALS — BP 102/68 | HR 72 | Temp 96.0°F | Resp 18 | Wt 152.4 lb

## 2019-04-08 DIAGNOSIS — Z7189 Other specified counseling: Secondary | ICD-10-CM

## 2019-04-08 DIAGNOSIS — I25119 Atherosclerotic heart disease of native coronary artery with unspecified angina pectoris: Secondary | ICD-10-CM | POA: Diagnosis not present

## 2019-04-08 DIAGNOSIS — G620 Drug-induced polyneuropathy: Secondary | ICD-10-CM | POA: Diagnosis not present

## 2019-04-08 DIAGNOSIS — C689 Malignant neoplasm of urinary organ, unspecified: Secondary | ICD-10-CM | POA: Diagnosis not present

## 2019-04-08 DIAGNOSIS — C652 Malignant neoplasm of left renal pelvis: Secondary | ICD-10-CM | POA: Diagnosis not present

## 2019-04-08 DIAGNOSIS — C772 Secondary and unspecified malignant neoplasm of intra-abdominal lymph nodes: Secondary | ICD-10-CM | POA: Diagnosis not present

## 2019-04-08 DIAGNOSIS — Z5112 Encounter for antineoplastic immunotherapy: Secondary | ICD-10-CM | POA: Diagnosis not present

## 2019-04-08 DIAGNOSIS — T451X5A Adverse effect of antineoplastic and immunosuppressive drugs, initial encounter: Secondary | ICD-10-CM

## 2019-04-08 LAB — COMPREHENSIVE METABOLIC PANEL
ALT: 16 U/L (ref 0–44)
AST: 15 U/L (ref 15–41)
Albumin: 3.3 g/dL — ABNORMAL LOW (ref 3.5–5.0)
Alkaline Phosphatase: 86 U/L (ref 38–126)
Anion gap: 9 (ref 5–15)
BUN: 21 mg/dL (ref 8–23)
CO2: 25 mmol/L (ref 22–32)
Calcium: 9.3 mg/dL (ref 8.9–10.3)
Chloride: 101 mmol/L (ref 98–111)
Creatinine, Ser: 0.71 mg/dL (ref 0.61–1.24)
GFR calc Af Amer: >60 mL/min (ref >60)
GFR calc non Af Amer: >60 mL/min (ref >60)
Glucose, Bld: 200 mg/dL — ABNORMAL HIGH (ref 70–99)
Potassium: 4.1 mmol/L (ref 3.5–5.1)
Sodium: 135 mmol/L (ref 135–145)
Total Bilirubin: 0.8 mg/dL (ref 0.3–1.2)
Total Protein: 7.1 g/dL (ref 6.5–8.1)

## 2019-04-08 LAB — CBC WITH DIFFERENTIAL/PLATELET
Abs Immature Granulocytes: 0.02 10*3/uL (ref 0.00–0.07)
Basophils Absolute: 0.1 10*3/uL (ref 0.0–0.1)
Basophils Relative: 1 %
Eosinophils Absolute: 0.1 10*3/uL (ref 0.0–0.5)
Eosinophils Relative: 1 %
HCT: 30.4 % — ABNORMAL LOW (ref 39.0–52.0)
Hemoglobin: 9.7 g/dL — ABNORMAL LOW (ref 13.0–17.0)
Immature Granulocytes: 0 %
Lymphocytes Relative: 19 %
Lymphs Abs: 1.2 10*3/uL (ref 0.7–4.0)
MCH: 27.9 pg (ref 26.0–34.0)
MCHC: 31.9 g/dL (ref 30.0–36.0)
MCV: 87.4 fL (ref 80.0–100.0)
Monocytes Absolute: 0.6 10*3/uL (ref 0.1–1.0)
Monocytes Relative: 9 %
Neutro Abs: 4.6 10*3/uL (ref 1.7–7.7)
Neutrophils Relative %: 70 %
Platelets: 317 10*3/uL (ref 150–400)
RBC: 3.48 MIL/uL — ABNORMAL LOW (ref 4.22–5.81)
RDW: 15.6 % — ABNORMAL HIGH (ref 11.5–15.5)
WBC: 6.6 10*3/uL (ref 4.0–10.5)
nRBC: 0 % (ref 0.0–0.2)

## 2019-04-08 MED ORDER — SODIUM CHLORIDE 0.9% FLUSH
10.0000 mL | Freq: Once | INTRAVENOUS | Status: AC
  Administered 2019-04-08: 10:00:00 10 mL via INTRAVENOUS
  Filled 2019-04-08: qty 10

## 2019-04-08 MED ORDER — HEPARIN SOD (PORK) LOCK FLUSH 100 UNIT/ML IV SOLN
500.0000 [IU] | Freq: Once | INTRAVENOUS | Status: AC
  Administered 2019-04-08: 500 [IU] via INTRAVENOUS

## 2019-04-08 NOTE — Progress Notes (Signed)
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 [name] so far for his metastatic urothelial carcinoma. This was originally diagnosed in May 2019. He was noted to have a filling defect in the lower pole collecting system of the left kidney along with para-aortic and retroperitoneal adenopathy concerning for metastatic disease. He underwent left ureteroscopy and renal pelvis biopsy which revealed small fragments of high-grade urothelial carcinoma with small focus of invasion. He was started on carboplatin and gemcitabine chemotherapy in June 2019 and was continued on 05/27/2018 for 8 cycles. He tolerated chemotherapy well except for chemo-induced anemia for which she has been getting Procrit every 2 weeks. He did have response to his disease based on scans in August 2019. However repeat scan on 05/31/2018 showed increase in the size of the primary tumor from 1.2 to 1.9 cm and increase in left para-aortic adenopathy from 0.9 to 1.3 cm. Periportal adenopathy was stable at 1.4 cm. Second line immunotherapy was recommended. His initial biopsy specimen did not have enough sample to undergo FGFR mutation testing. He also has mild interstitial lung  disease for which he sees pulmonary but he is not on home oxygen. He has B12 deficiency for which he is on oral B12. Also has diabetes and coronary artery disease.Tecentriq started on 06/17/2018.Patient noted to have disease progression in his lymph nodes in May 2020. Repeat biopsy showed metastatic urothelial carcinoma which did not have aFGFR mutation. He has been started on third line Padcev  Interval history-met with patient and his wife today.  Wife reports that overall patient has been getting weaker and feeling more fatigued.  He is also had problems with neuropathy and right foot drop and has been working with physical therapy.  He has not had any major falls.  ECOG PS- 1 Pain scale- 0   Review of systems- Review of Systems  Constitutional: Positive for malaise/fatigue. Negative for chills, fever and weight loss.  HENT: Negative for congestion, ear discharge and nosebleeds.   Eyes: Negative for blurred vision.  Respiratory: Negative for cough, hemoptysis, sputum production, shortness of breath and wheezing.   Cardiovascular: Negative for chest pain, palpitations, orthopnea and claudication.  Gastrointestinal: Negative for abdominal pain, blood in stool, constipation, diarrhea, heartburn, melena, nausea and vomiting.  Genitourinary: Negative for dysuria, flank pain, frequency, hematuria and urgency.  Musculoskeletal: Negative for back pain, joint pain and myalgias.  Skin: Negative for rash.  Neurological: Positive for sensory change (Peripheral neuropathy). Negative for dizziness, tingling, focal weakness, seizures, weakness and headaches.  Endo/Heme/Allergies: Does not bruise/bleed easily.  Psychiatric/Behavioral: Negative for depression and suicidal ideas. The patient does not have insomnia.       Allergies  Allergen Reactions   Sulfa Antibiotics Other (See Comments)    Joint pain   Ace Inhibitors Cough   Invokana [Canagliflozin] Other (See Comments)    Leg pain  Penicillins Rash    Did it involve swelling of the face/tongue/throat, SOB, or low BP? No Did it involve sudden or severe rash/hives, skin peeling, or any reaction on the inside of your mouth or nose? Yes Did you need to seek medical attention at a hospital or doctor's office? Yes When did it last happen?childhood allergy If all above answers are "NO", may proceed with cephalosporin use.      Past Medical History:  Diagnosis Date   Acid reflux    Anxiety    Depression    Diabetes mellitus without complication ([place])    Hyperlipidemia    Hypertension    MRSA (methicillin resistant Staphylococcus aureus) infection 1992   history   Myocardial infarction ([place]) 1995   Sleep apnea    CPAP   Urothelial cancer ([place]) 11/2017   Left Urothelial mass, chemo tx's     Past Surgical History:  Procedure Laterality Date   CATARACT EXTRACTION Left    COLONOSCOPY  2010   [facility]   COLONOSCOPY WITH PROPOFOL N/A 10/04/2016   Procedure: COLONOSCOPY WITH PROPOFOL;  Surgeon: [name], MD;  Location: [facility];  Service: Endoscopy;  Laterality: N/A;   [place], 2000, 2001, 2014   CYSTOSCOPY W/ RETROGRADES Bilateral 12/10/2017   Procedure: CYSTOSCOPY WITH RETROGRADE PYELOGRAM;  Surgeon: [name], MD;  Location: [facility];  Service: Urology;  Laterality: Bilateral;   CYSTOSCOPY W/ RETROGRADES Left 12/09/2018   Procedure: CYSTOSCOPY WITH RETROGRADE PYELOGRAM;  Surgeon: [name], MD;  Location: [facility];  Service: Urology;  Laterality: Left;   CYSTOSCOPY WITH BIOPSY Left 12/09/2018   Procedure: CYSTOSCOPY WITH URETERAL/RENAL PELVIC BIOPSY;  Surgeon: [name], MD;  Location: [facility];  Service: Urology;  Laterality: Left;   CYSTOSCOPY WITH STENT PLACEMENT Left 12/10/2017   Procedure: CYSTOSCOPY WITH STENT PLACEMENT;  Surgeon: [name], MD;  Location: [facility];  Service: Urology;  Laterality: Left;    CYSTOSCOPY WITH STENT PLACEMENT Left 12/09/2018   Procedure: CYSTOSCOPY WITH STENT PLACEMENT;  Surgeon: [name], MD;  Location: [facility];  Service: Urology;  Laterality: Left;   CYSTOSCOPY WITH URETEROSCOPY Left 12/09/2018   Procedure: CYSTOSCOPY WITH URETEROSCOPY;  Surgeon: [name], MD;  Location: [facility];  Service: Urology;  Laterality: Left;   EYE SURGERY Bilateral    cataract   INGUINAL HERNIA REPAIR Right 08/09/2015   Procedure: HERNIA REPAIR INGUINAL ADULT;  Surgeon: [name], MD;  Location: [facility];  Service: General;  Laterality: Right;   NASAL SINUS SURGERY     nuclear stress test     PORTA CATH INSERTION N/A 12/19/2017   Procedure: PORTA CATH INSERTION;  Surgeon: [name], MD;  Location: [facility];  Service: Cardiovascular;  Laterality: N/A;   URETERAL BIOPSY Left 12/10/2017   Procedure: URETERAL & renal PELVIS BIOPSY;  Surgeon: [name], MD;  Location: [facility];  Service: Urology;  Laterality: Left;   URETEROSCOPY Left 12/10/2017   Procedure: URETEROSCOPY;  Surgeon: [name], MD;  Location: [facility];  Service: Urology;  Laterality: Left;    Social History   Socioeconomic History   Marital status: Married    Spouse name: [name]   Number of children: 1   Years of education: Not on file   Highest education level: Associate degree: occupational, Hotel manager, or vocational program  Occupational History   Occupation: retired  Scientist, product/process development strain: Not hard at [employer] insecurity  Worry: Never true    Inability: Never true   Transportation needs    Medical: No    Non-medical: No  Tobacco Use   Smoking status: Former Smoker    Types: Cigars    Quit date: 10/09/1978    Years since quitting: 40.5   Smokeless tobacco: Former Systems developer    Types: Chew    Quit date: 12/04/1988   Tobacco comment: on occasion  Substance and Sexual Activity   Alcohol use: Not Currently   Drug use: No   Sexual  activity: Not Currently  Lifestyle   Physical activity    Days per week: 0 days    Minutes per session: 0 min   Stress: Not at all  Relationships   Social connections    Talks on phone: Patient refused    Gets together: Patient refused    Attends religious service: Patient refused    Active member of club or organization: Patient refused    Attends meetings of clubs or organizations: Patient refused    Relationship status: Patient refused   Intimate partner violence    Fear of current or ex partner: Patient refused    Emotionally abused: Patient refused    Physically abused: Patient refused    Forced sexual activity: Patient refused  Other Topics Concern   Not on file  Social History Narrative   Not on file    Family History  Problem Relation Age of Onset   Heart disease Mother    Cancer Father        Lung and colon cancer   Heart disease Father    Emphysema Maternal Grandfather    Tuberculosis Maternal Grandmother      Current Outpatient Medications:    aspirin EC 81 MG tablet, Take 81 mg by mouth every evening. , Disp: , Rfl:    atorvastatin (LIPITOR) 40 MG tablet, TAKE 1 TABLET BY MOUTH AT BEDTIME, Disp: 90 tablet, Rfl: 4   Cholecalciferol (VITAMIN D3) 75 MCG (3000 UT) TABS, Take by mouth., Disp: , Rfl:    clobetasol cream (TEMOVATE) 5.88 %, Apply 1 application topically as needed (for skin rash)., Disp: 30 g, Rfl: 1   empagliflozin (JARDIANCE) 10 MG TABS tablet, Take 10 mg by mouth daily. (Patient taking differently: Take 10 mg by mouth daily as needed (elevated blood sugar). ), Disp: 90 tablet, Rfl: 3   fluticasone (FLONASE) 50 MCG/ACT nasal spray, Use 2 spray(s) in each nostril once daily (Patient taking differently: Place 2 sprays into both nostrils daily as needed for allergies. ), Disp: 48 g, Rfl: 3   gabapentin (NEURONTIN) 300 MG capsule, TAKE 1 CAPSULE BY MOUTH THREE TIMES DAILY (START WITH 1 CAPSULE NIGHTLY THEN SLOWLY INCREASE TO TWICE DAILY  THEN 3 TIMES DAILY OVER 7 DAYS), Disp: 90 capsule, Rfl: 0   glucose blood (CONTOUR NEXT TEST) test strip, Check blood sugars 3 times daily., Disp: 300 each, Rfl: 11   hydrOXYzine (ATARAX/VISTARIL) 25 MG tablet, Take 1 tablet (25 mg total) by mouth every 8 (eight) hours as needed for itching., Disp: 30 tablet, Rfl: 3   Ivermectin (SOOLANTRA) 1 % CREA, Apply 1 application topically daily as needed (rosacea). To face, Disp: , Rfl:    lidocaine-prilocaine (EMLA) cream, Apply 1 application topically as needed (port access)., Disp: 1 g, Rfl: 3   loratadine (CLARITIN) 10 MG tablet, Take 10 mg by mouth daily as needed for allergies. [employer] brand allergy relief, Disp: , Rfl:    losartan (COZAAR) 100 MG tablet,  TAKE 1 TABLET BY MOUTH ONCE DAILY, Disp: 90 tablet, Rfl: 3   magnesium hydroxide (MILK OF MAGNESIA) 400 MG/5ML suspension, Take 15 mLs by mouth daily as needed for mild constipation., Disp: , Rfl:    metFORMIN (GLUCOPHAGE) 1000 MG tablet, TAKE 1 TABLET BY MOUTH TWICE DAILY WITH MEALS (Patient taking differently: Take 1,000 mg by mouth 2 (two) times a day. ), Disp: 180 tablet, Rfl: 3   nystatin cream (MYCOSTATIN), Apply topically 2 (two) times daily. (Patient taking differently: Apply 1 application topically 2 (two) times daily as needed (rash). ), Disp: 15 g, Rfl: 1   OVER THE COUNTER MEDICATION, Apply 1 application topically daily as needed (pain). Outback topical pain relief, Disp: , Rfl:    sertraline (ZOLOFT) 100 MG tablet, Take 1 tablet (100 mg total) by mouth daily., Disp: 90 tablet, Rfl: 3   vitamin B-12 (CYANOCOBALAMIN) 1000 MCG tablet, Take by mouth daily. Unsure dose, Disp: , Rfl:    vitamin C (ASCORBIC ACID) 500 MG tablet, Take 1,000-1,500 mg by mouth daily as needed (immune support)., Disp: , Rfl:    magic mouthwash w/lidocaine SOLN, Take 5 mLs by mouth 4 (four) times daily as needed for mouth pain., Disp: 480 mL, Rfl: 3 No current facility-administered medications for this  visit.   Facility-Administered Medications Ordered in Other Visits:    heparin lock flush 100 unit/mL, 500 Units, Intravenous, Once, Sindy Guadeloupe, MD   Influenza vac split quadrivalent PF (FLUZONE HIGH-DOSE) injection 0.5 mL, 0.5 mL, Intramuscular, Once, Karen Kitchens, NP  Physical exam:  Vitals:   04/08/19 1000  BP: 102/68  Pulse: 72  Resp: 18  Temp: (!) 96 F (35.6 C)  SpO2: 100%  Weight: 152 lb 6.4 oz (69.1 kg)   Physical Exam Constitutional:      Comments: Thin elderly gentleman in no acute distress.  He is sitting in a wheelchair  HENT:     Head: Normocephalic and atraumatic.  Eyes:     Pupils: Pupils are equal, round, and reactive to light.  Neck:     Musculoskeletal: Normal range of motion.  Cardiovascular:     Rate and Rhythm: Normal rate and regular rhythm.     Heart sounds: Normal heart sounds.  Pulmonary:     Effort: Pulmonary effort is normal.     Breath sounds: Normal breath sounds.  Abdominal:     General: Bowel sounds are normal.     Palpations: Abdomen is soft.  Skin:    General: Skin is warm and dry.  Neurological:     Mental Status: He is alert and oriented to person, place, and time.     Comments: Weakness noted in her right dorsiflexors      CMP Latest Ref Rng & Units 04/08/2019  Glucose 70 - 99 mg/dL 200(H)  BUN 8 - 23 mg/dL 21  Creatinine 0.61 - 1.24 mg/dL 0.71  Sodium 135 - 145 mmol/L 135  Potassium 3.5 - 5.1 mmol/L 4.1  Chloride 98 - 111 mmol/L 101  CO2 22 - 32 mmol/L 25  Calcium 8.9 - 10.3 mg/dL 9.3  Total Protein 6.5 - 8.1 g/dL 7.1  Total Bilirubin 0.3 - 1.2 mg/dL 0.8  Alkaline Phos 38 - 126 U/L 86  AST 15 - 41 U/L 15  ALT 0 - 44 U/L 16   CBC Latest Ref Rng & Units 04/08/2019  WBC 4.0 - 10.5 K/uL 6.6  Hemoglobin 13.0 - 17.0 g/dL 9.7(L)  Hematocrit 39.0 - 52.0 % 30.4(L)  Platelets 150 - 400 K/uL 317    No images are attached to the encounter.  Ct Abdomen Pelvis W Contrast  Result Date: 03/31/2019 CLINICAL DATA:   Metastatic urothelial carcinoma. EXAM: CT ABDOMEN AND PELVIS WITH CONTRAST TECHNIQUE: Multidetector CT imaging of the abdomen and pelvis was performed using the standard protocol following bolus administration of intravenous contrast. CONTRAST:  73m OMNIPAQUE IOHEXOL 300 MG/ML  SOLN COMPARISON:  CT chest 03/03/2019 and CT abdomen pelvis 11/25/2018. FINDINGS: Lower chest: Lung bases show mild subpleural reticulation and ground-glass. Atherosclerotic calcification of the aorta and coronary arteries. Heart is at the upper limits of normal in size to mildly enlarged. Small to moderate pericardial effusion is new from 03/03/2019. No pleural effusion. Distal esophagus is grossly unremarkable. Hepatobiliary: Liver is unremarkable. Stones are seen in the gallbladder. No biliary ductal dilatation. Pancreas: Negative. Spleen: Negative. Adrenals/Urinary Tract: Adrenal glands are unremarkable. Low-attenuation lesions measure up to 3.8 cm on the right and are likely cysts although definitive characterization is limited without precontrast imaging, as some lesions measure up to 22 Hounsfield units. There is ill-defined decreased attenuation in the mid and left lower pole without a discrete measurable lesion, as on the prior exam. Associated mild atrophy. Ureters are decompressed. Bladder is grossly unremarkable. Stomach/Bowel: Stomach, small bowel, appendix and colon are unremarkable. Vascular/Lymphatic: Atherosclerotic calcification of the aorta without aneurysm. Left periaortic lymph node measures 9 mm (2/38), decreased from 1.7 cm. No additional pathologically enlarged lymph nodes. Reproductive: Prostate is visualized. Other: No free fluid. Mesenteries and peritoneum are otherwise unremarkable. Musculoskeletal: Degenerative changes in the spine. No worrisome lytic or sclerotic lesions. IMPRESSION: 1. Ill-defined decreased attenuation in the mid and lower pole of the left kidney, without a discrete measurable lesion. Decrease  in left periaortic adenopathy. Findings are indicative of treatment response. 2. Small to moderate pericardial effusion, new. 3. Mild subpleural fibrosis. 4. Cholelithiasis. 5. Aortic atherosclerosis (ICD10-170.0). Coronary artery calcification. Electronically Signed   By: MLorin PicketM.D.   On: 03/31/2019 10:09     Assessment and plan- Patient is a 73y.o. male with history of metastatic urothelial carcinoma with metastases to the lymph nodes.He had disease progression on carboplatin/gemcitabine as well as second line Tecentriq.    He is here for on treatment assessment prior to cycle 4-day 1 of padcev  I have reviewed CT abdomen and pelvis images independently and discussed findings with the patient.  Overall he has had response to treatment so far and there is reduction in the size of para-aortic adenopathy.  However he has been developing more neuropathy with Padcev likely made worse due to his pre-existing diabetes.  We discussed giving him a break from treatment for the next 2 weeks and I will see him back in 2 weeks.  At that time we have possibly 3 options in front of [place]  Continue Padcev with a modified schedule of either 2 weeks on 1 week off for 1 week on 1 week off.  Second option would be to switch him from Padcev to third line chemotherapy such as Alimta versus best supportive care/hospice patient would like to think about these 3 options and get back to me when I see him in 2 weeks  Chemo-induced peripheral neuropathy: I have asked him to increase his gabapentin to 300 mg 3 times a day.  We will also refer him to acupuncture to see if it helps.  He continues to work with physical therapy as well.  CT abdomen did not show any bone  lesions causing spinal canal compromise that would explain his partial right foot drop.  We discussed getting a possible MRI of his brain which she would like to hold off for now.  I will reassess his symptoms in 2 weeks   Total face to face encounter time  for this patient visit was 40 min. >50% of the time was  spent in counseling and coordination of care.     Visit Diagnosis 1. Chemotherapy-induced peripheral neuropathy ([place])   2. Goals of care, counseling/discussion   3. Urothelial cancer ([place])      Dr. Randa Evens, MD, MPH [facility] [phone] 04/08/2019 1:18 PM

## 2019-04-08 NOTE — Telephone Encounter (Signed)
Yes to magic mouthwash and PT

## 2019-04-08 NOTE — Telephone Encounter (Signed)
Sorry, I sent it yest evening and spoke to pt.

## 2019-04-09 ENCOUNTER — Telehealth: Payer: Self-pay | Admitting: *Deleted

## 2019-04-09 NOTE — Telephone Encounter (Signed)
Called pt and spoke to pt and wife about acupuncture for neuropathy from [place].  The pt and wife chose Dr. Raul Del. I told them that the cancer center has a fund that will cover 6 visits for free. The navigator will call the office and let them know that the pt. Needs appt and that 6 visits are paid for. Patient and wife agreeable and gave phone number of the office in case the office does not call in a timely manner. Told pt that if they have not heard from office by Friday noon, then they call. I told them that navigator will send the info in for the free service tom. They are agreeable to all above

## 2019-04-10 DIAGNOSIS — G620 Drug-induced polyneuropathy: Secondary | ICD-10-CM | POA: Diagnosis not present

## 2019-04-10 DIAGNOSIS — D6481 Anemia due to antineoplastic chemotherapy: Secondary | ICD-10-CM | POA: Diagnosis not present

## 2019-04-10 DIAGNOSIS — R530 Neoplastic (malignant) related fatigue: Secondary | ICD-10-CM | POA: Diagnosis not present

## 2019-04-10 DIAGNOSIS — D642 Secondary sideroblastic anemia due to drugs and toxins: Secondary | ICD-10-CM | POA: Diagnosis not present

## 2019-04-10 DIAGNOSIS — C772 Secondary and unspecified malignant neoplasm of intra-abdominal lymph nodes: Secondary | ICD-10-CM | POA: Diagnosis not present

## 2019-04-10 DIAGNOSIS — T451X5D Adverse effect of antineoplastic and immunosuppressive drugs, subsequent encounter: Secondary | ICD-10-CM | POA: Diagnosis not present

## 2019-04-15 DIAGNOSIS — T451X5D Adverse effect of antineoplastic and immunosuppressive drugs, subsequent encounter: Secondary | ICD-10-CM | POA: Diagnosis not present

## 2019-04-15 DIAGNOSIS — D642 Secondary sideroblastic anemia due to drugs and toxins: Secondary | ICD-10-CM | POA: Diagnosis not present

## 2019-04-15 DIAGNOSIS — C772 Secondary and unspecified malignant neoplasm of intra-abdominal lymph nodes: Secondary | ICD-10-CM | POA: Diagnosis not present

## 2019-04-15 DIAGNOSIS — G620 Drug-induced polyneuropathy: Secondary | ICD-10-CM | POA: Diagnosis not present

## 2019-04-15 DIAGNOSIS — R530 Neoplastic (malignant) related fatigue: Secondary | ICD-10-CM | POA: Diagnosis not present

## 2019-04-15 DIAGNOSIS — D6481 Anemia due to antineoplastic chemotherapy: Secondary | ICD-10-CM | POA: Diagnosis not present

## 2019-04-17 ENCOUNTER — Encounter: Payer: Self-pay | Admitting: Oncology

## 2019-04-17 DIAGNOSIS — R530 Neoplastic (malignant) related fatigue: Secondary | ICD-10-CM | POA: Diagnosis not present

## 2019-04-17 DIAGNOSIS — D6481 Anemia due to antineoplastic chemotherapy: Secondary | ICD-10-CM | POA: Diagnosis not present

## 2019-04-17 DIAGNOSIS — T451X5D Adverse effect of antineoplastic and immunosuppressive drugs, subsequent encounter: Secondary | ICD-10-CM | POA: Diagnosis not present

## 2019-04-17 DIAGNOSIS — D642 Secondary sideroblastic anemia due to drugs and toxins: Secondary | ICD-10-CM | POA: Diagnosis not present

## 2019-04-17 DIAGNOSIS — C772 Secondary and unspecified malignant neoplasm of intra-abdominal lymph nodes: Secondary | ICD-10-CM | POA: Diagnosis not present

## 2019-04-17 DIAGNOSIS — G620 Drug-induced polyneuropathy: Secondary | ICD-10-CM | POA: Diagnosis not present

## 2019-04-21 ENCOUNTER — Encounter: Payer: Self-pay | Admitting: Oncology

## 2019-04-21 ENCOUNTER — Other Ambulatory Visit: Payer: Self-pay

## 2019-04-21 ENCOUNTER — Encounter: Payer: Self-pay | Admitting: Family Medicine

## 2019-04-21 ENCOUNTER — Ambulatory Visit (INDEPENDENT_AMBULATORY_CARE_PROVIDER_SITE_OTHER): Payer: Medicare Other | Admitting: Family Medicine

## 2019-04-21 VITALS — BP 104/62 | HR 86 | Temp 98.2°F | Resp 16 | Ht 68.0 in | Wt 149.0 lb

## 2019-04-21 DIAGNOSIS — I10 Essential (primary) hypertension: Secondary | ICD-10-CM

## 2019-04-21 DIAGNOSIS — C642 Malignant neoplasm of left kidney, except renal pelvis: Secondary | ICD-10-CM | POA: Diagnosis not present

## 2019-04-21 DIAGNOSIS — I25119 Atherosclerotic heart disease of native coronary artery with unspecified angina pectoris: Secondary | ICD-10-CM

## 2019-04-21 DIAGNOSIS — E119 Type 2 diabetes mellitus without complications: Secondary | ICD-10-CM | POA: Diagnosis not present

## 2019-04-21 DIAGNOSIS — E7849 Other hyperlipidemia: Secondary | ICD-10-CM

## 2019-04-21 DIAGNOSIS — G4733 Obstructive sleep apnea (adult) (pediatric): Secondary | ICD-10-CM | POA: Diagnosis not present

## 2019-04-21 NOTE — Progress Notes (Signed)
Patient stated that he had been doing PT and today he will go to his first consult for acupuncture.

## 2019-04-21 NOTE — Progress Notes (Addendum)
Patient: Patrick Mcgee Male    DOB: 15-Aug-1945   73 y.o.   MRN: [id number] Visit Date: 04/21/2019  Today's Provider: Wilhemena Durie, MD   Chief Complaint  Patient presents with  . Hypertension   Subjective:   HPI  Hypertension, follow-up:  BP Readings from Last 3 Encounters:  04/21/19 104/62  04/08/19 102/68  03/31/19 128/82    He was last seen for hypertension 1 months ago.  BP at that visit was 88/52. Management since that visit includes discontinuing Coreg. He reports good compliance with treatment. He is not having side effects.  He is not exercising. He is adherent to low salt diet.   Outside blood pressures are checked occasionally. Patient reports that this has averaged in the 90s-100s/60s  He is experiencing none.  Patient denies exertional chest pressure/discomfort, lower extremity edema and palpitations.   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  He is trying to decide whether to continue treatment or to just keep comfortable./Palliative measures.  Blood sugars are doing fine.  Allergies  Allergen Reactions  . Sulfa Antibiotics Other (See Comments)    Joint pain  . Ace Inhibitors Cough  . Invokana [Canagliflozin] Other (See Comments)    Leg pain  . Penicillins Rash    Did it involve swelling of the face/tongue/throat, SOB, or low BP? No Did it involve sudden or severe rash/hives, skin peeling, or any reaction on the inside of your mouth or nose? Yes Did you need to seek medical attention at a hospital or doctor's office? Yes When did it last happen?childhood allergy If all above answers are "NO", may proceed with cephalosporin use.      Current Outpatient Medications:  .  aspirin EC 81  MG tablet, Take 81 mg by mouth every evening. , Disp: , Rfl:  .  atorvastatin (LIPITOR) 40 MG tablet, TAKE 1 TABLET BY MOUTH AT BEDTIME, Disp: 90 tablet, Rfl: 4 .  Cholecalciferol (VITAMIN D3) 75 MCG (3000 UT) TABS, Take by mouth., Disp: , Rfl:  .  clobetasol cream (TEMOVATE) [id number] %, Apply 1 application topically as needed (for skin rash)., Disp: 30 g, Rfl: 1 .  empagliflozin (JARDIANCE) 10 MG TABS tablet, Take 10 mg by mouth daily. (Patient taking differently: Take 10 mg by mouth daily as needed (elevated blood sugar). ), Disp: 90 tablet, Rfl: 3 .  fluticasone (FLONASE) 50 MCG/ACT nasal spray, Use 2 spray(s) in each nostril once daily (Patient taking differently: Place 2 sprays into both nostrils daily as needed for allergies. ), Disp: 48 g, Rfl: 3 .  gabapentin (NEURONTIN) 300 MG capsule, TAKE 1 CAPSULE BY MOUTH THREE TIMES DAILY (START WITH 1 CAPSULE NIGHTLY THEN SLOWLY INCREASE TO TWICE DAILY THEN 3 TIMES DAILY OVER 7 DAYS), Disp: 90 capsule, Rfl: 0 .  glucose blood (CONTOUR NEXT TEST) test strip, Check blood sugars 3 times daily., Disp: 300 each, Rfl: 11 .  hydrOXYzine (ATARAX/VISTARIL) 25 MG tablet, Take 1 tablet (25 mg total) by mouth every 8 (eight) hours as needed for itching., Disp: 30 tablet, Rfl: 3 .  Ivermectin (SOOLANTRA) 1 % CREA, Apply 1 application topically daily as needed (rosacea). To face, Disp: , Rfl:  .  lidocaine-prilocaine (EMLA) cream, Apply 1 application topically as needed (port access)., Disp: 1 g, Rfl: 3 .  loratadine (CLARITIN) 10 MG tablet, Take 10 mg by mouth daily as needed for allergies. [employer] brand allergy relief, Disp: , Rfl:  .  losartan (COZAAR) 100 MG tablet, TAKE 1 TABLET BY MOUTH ONCE DAILY, Disp: 90 tablet, Rfl: 3 .  magic mouthwash w/lidocaine SOLN, Take 5 mLs by mouth 4 (four) times daily as needed for mouth pain., Disp: 480 mL, Rfl: 3 .  magnesium hydroxide (MILK OF MAGNESIA) 400 MG/5ML suspension, Take 15 mLs by mouth daily as needed for mild  constipation., Disp: , Rfl:  .  metFORMIN (GLUCOPHAGE) 1000 MG tablet, TAKE 1 TABLET BY MOUTH TWICE DAILY WITH MEALS (Patient taking differently: Take 1,000 mg by mouth 2 (two) times a day. ), Disp: 180 tablet, Rfl: 3 .  nystatin cream (MYCOSTATIN), Apply topically 2 (two) times daily. (Patient taking differently: Apply 1 application topically 2 (two) times daily as needed (rash). ), Disp: 15 g, Rfl: 1 .  OVER THE COUNTER MEDICATION, Apply 1 application topically daily as needed (pain). Outback topical pain relief, Disp: , Rfl:  .  sertraline (ZOLOFT) 100 MG tablet, Take 1 tablet (100 mg total) by mouth daily., Disp: 90 tablet, Rfl: 3 .  vitamin B-12 (CYANOCOBALAMIN) 1000 MCG tablet, Take by mouth daily. Unsure dose, Disp: , Rfl:  .  vitamin C (ASCORBIC ACID) 500 MG tablet, Take 1,000-1,500 mg by mouth daily as needed (immune support)., Disp: , Rfl:  No current facility-administered medications for this visit.   Facility-Administered Medications Ordered in Other Visits:  .  Influenza vac split quadrivalent PF (FLUZONE HIGH-DOSE) injection 0.5 mL, 0.5 mL, Intramuscular, Once, Karen Kitchens, NP  Review of Systems  Constitutional: Negative for activity change and fatigue.  HENT: Negative.   Eyes: Negative.   Respiratory: Negative for cough and shortness of breath.   Cardiovascular: Negative for chest pain, palpitations and leg swelling.  Gastrointestinal: Negative.   Endocrine: Negative for cold intolerance, heat intolerance, polydipsia, polyphagia and polyuria.  Musculoskeletal: Positive for arthralgias. Negative for joint swelling and myalgias.  Allergic/Immunologic: Negative.   Neurological: Negative for dizziness, light-headedness and headaches.  Psychiatric/Behavioral: Negative for agitation, self-injury, sleep disturbance and suicidal ideas. The patient is not nervous/anxious.     Social History   Tobacco Use  . Smoking status: Former Smoker    Types: Cigars    Quit date: 10/09/1978     Years since quitting: 40.5  . Smokeless tobacco: Former Systems developer    Types: Chew    Quit date: 12/04/1988  . Tobacco comment: on occasion  Substance Use Topics  . Alcohol use: Not Currently      Objective:   BP 104/62   Pulse 86   Temp 98.2 F (36.8 C)   Resp 16   Ht 5\' 8"  (1.727 m)   Wt 149 lb (67.6 kg)   SpO2 96%   BMI 22.66 kg/m  Vitals:   04/21/19 1444  BP: 104/62  Pulse: 86  Resp: 16  Temp: 98.2 F (36.8 C)  SpO2: 96%  Weight: 149 lb (67.6 kg)  Height: 5\' 8"  (1.727 m)  Body mass index is 22.66 kg/m.   Physical Exam Vitals signs reviewed.  Constitutional:      Appearance: He is well-developed.  HENT:     Head: Normocephalic and atraumatic.     Right Ear: External ear normal.     Left Ear: External ear normal.  Nose: Nose normal.  Eyes:     General: No scleral icterus.    Conjunctiva/sclera: Conjunctivae normal.     Pupils: Pupils are equal, round, and reactive to light.  Neck:     Thyroid: No thyromegaly.  Cardiovascular:     Rate and Rhythm: Normal rate and regular rhythm.     Heart sounds: Normal heart sounds.  Pulmonary:     Effort: Pulmonary effort is normal.     Breath sounds: Normal breath sounds.  Abdominal:     Palpations: Abdomen is soft.  Lymphadenopathy:     Cervical: No cervical adenopathy.  Skin:    General: Skin is warm and dry.  Neurological:     General: No focal deficit present.     Mental Status: He is alert and oriented to person, place, and time.  Psychiatric:        Mood and Affect: Mood normal.        Behavior: Behavior normal.        Thought Content: Thought content normal.        Judgment: Judgment normal.      No results found for any visits on 04/21/19.     Assessment & Plan    1. Essential hypertension More than 50% 25 minute visit spent in counseling or coordination of care   2. Obstructive sleep apnea syndrome Uses CPAP nightly.  3. Type 2 diabetes mellitus without complication, without long-term  current use of insulin (HCC) Last A1c 7.69-month ago.  4. Urothelial carcinoma of kidney, left [facility]) May be moving to palliative care in the near future.  Per oncology  5. Other hyperlipidemia Consider stopping atorvastatin in the future.      Cranford Mon, MD  [facility]

## 2019-04-22 ENCOUNTER — Other Ambulatory Visit: Payer: Self-pay

## 2019-04-22 ENCOUNTER — Inpatient Hospital Stay (HOSPITAL_BASED_OUTPATIENT_CLINIC_OR_DEPARTMENT_OTHER): Payer: Medicare Other | Admitting: Hospice and Palliative Medicine

## 2019-04-22 ENCOUNTER — Inpatient Hospital Stay (HOSPITAL_BASED_OUTPATIENT_CLINIC_OR_DEPARTMENT_OTHER): Payer: Medicare Other | Admitting: Oncology

## 2019-04-22 ENCOUNTER — Inpatient Hospital Stay: Payer: Medicare Other

## 2019-04-22 ENCOUNTER — Inpatient Hospital Stay: Payer: Medicare Other | Attending: Oncology

## 2019-04-22 VITALS — BP 99/70 | HR 81 | Temp 97.1°F | Resp 16 | Ht 68.0 in | Wt 148.0 lb

## 2019-04-22 DIAGNOSIS — Z5112 Encounter for antineoplastic immunotherapy: Secondary | ICD-10-CM | POA: Insufficient documentation

## 2019-04-22 DIAGNOSIS — I25119 Atherosclerotic heart disease of native coronary artery with unspecified angina pectoris: Secondary | ICD-10-CM | POA: Diagnosis not present

## 2019-04-22 DIAGNOSIS — Z5111 Encounter for antineoplastic chemotherapy: Secondary | ICD-10-CM

## 2019-04-22 DIAGNOSIS — Z7189 Other specified counseling: Secondary | ICD-10-CM

## 2019-04-22 DIAGNOSIS — C642 Malignant neoplasm of left kidney, except renal pelvis: Secondary | ICD-10-CM

## 2019-04-22 DIAGNOSIS — T451X5A Adverse effect of antineoplastic and immunosuppressive drugs, initial encounter: Secondary | ICD-10-CM

## 2019-04-22 DIAGNOSIS — C652 Malignant neoplasm of left renal pelvis: Secondary | ICD-10-CM | POA: Diagnosis not present

## 2019-04-22 DIAGNOSIS — C772 Secondary and unspecified malignant neoplasm of intra-abdominal lymph nodes: Secondary | ICD-10-CM | POA: Diagnosis not present

## 2019-04-22 DIAGNOSIS — Z515 Encounter for palliative care: Secondary | ICD-10-CM | POA: Diagnosis not present

## 2019-04-22 DIAGNOSIS — D701 Agranulocytosis secondary to cancer chemotherapy: Secondary | ICD-10-CM

## 2019-04-22 DIAGNOSIS — G620 Drug-induced polyneuropathy: Secondary | ICD-10-CM

## 2019-04-22 DIAGNOSIS — Z95828 Presence of other vascular implants and grafts: Secondary | ICD-10-CM

## 2019-04-22 LAB — CBC WITH DIFFERENTIAL/PLATELET
Abs Immature Granulocytes: 0.02 10*3/uL (ref 0.00–0.07)
Basophils Absolute: 0 10*3/uL (ref 0.0–0.1)
Basophils Relative: 1 %
Eosinophils Absolute: 0.1 10*3/uL (ref 0.0–0.5)
Eosinophils Relative: 2 %
HCT: 31.4 % — ABNORMAL LOW (ref 39.0–52.0)
Hemoglobin: 10.2 g/dL — ABNORMAL LOW (ref 13.0–17.0)
Immature Granulocytes: 0 %
Lymphocytes Relative: 17 %
Lymphs Abs: 1.1 10*3/uL (ref 0.7–4.0)
MCH: 28.2 pg (ref 26.0–34.0)
MCHC: 32.5 g/dL (ref 30.0–36.0)
MCV: 86.7 fL (ref 80.0–100.0)
Monocytes Absolute: 0.5 10*3/uL (ref 0.1–1.0)
Monocytes Relative: 7 %
Neutro Abs: 4.8 10*3/uL (ref 1.7–7.7)
Neutrophils Relative %: 73 %
Platelets: 361 10*3/uL (ref 150–400)
RBC: 3.62 MIL/uL — ABNORMAL LOW (ref 4.22–5.81)
RDW: 15.3 % (ref 11.5–15.5)
WBC: 6.5 10*3/uL (ref 4.0–10.5)
nRBC: 0 % (ref 0.0–0.2)

## 2019-04-22 LAB — COMPREHENSIVE METABOLIC PANEL
ALT: 12 U/L (ref 0–44)
AST: 13 U/L — ABNORMAL LOW (ref 15–41)
Albumin: 3.4 g/dL — ABNORMAL LOW (ref 3.5–5.0)
Alkaline Phosphatase: 91 U/L (ref 38–126)
Anion gap: 9 (ref 5–15)
BUN: 17 mg/dL (ref 8–23)
CO2: 26 mmol/L (ref 22–32)
Calcium: 9.7 mg/dL (ref 8.9–10.3)
Chloride: 100 mmol/L (ref 98–111)
Creatinine, Ser: 0.63 mg/dL (ref 0.61–1.24)
GFR calc Af Amer: >60 mL/min (ref >60)
GFR calc non Af Amer: >60 mL/min (ref >60)
Glucose, Bld: 216 mg/dL — ABNORMAL HIGH (ref 70–99)
Potassium: 4.3 mmol/L (ref 3.5–5.1)
Sodium: 135 mmol/L (ref 135–145)
Total Bilirubin: 0.6 mg/dL (ref 0.3–1.2)
Total Protein: 7.2 g/dL (ref 6.5–8.1)

## 2019-04-22 MED ORDER — SODIUM CHLORIDE 0.9 % IV SOLN
Freq: Once | INTRAVENOUS | Status: AC
  Administered 2019-04-22: 11:00:00 via INTRAVENOUS
  Filled 2019-04-22: qty 250

## 2019-04-22 MED ORDER — PALONOSETRON HCL INJECTION 0.25 MG/5ML
0.2500 mg | Freq: Once | INTRAVENOUS | Status: AC
  Administered 2019-04-22: 0.25 mg via INTRAVENOUS
  Filled 2019-04-22: qty 5

## 2019-04-22 MED ORDER — DEXAMETHASONE SODIUM PHOSPHATE 10 MG/ML IJ SOLN
10.0000 mg | Freq: Once | INTRAMUSCULAR | Status: AC
  Administered 2019-04-22: 10 mg via INTRAVENOUS
  Filled 2019-04-22: qty 1

## 2019-04-22 MED ORDER — SODIUM CHLORIDE 0.9 % IV SOLN
1.0000 mg/kg | Freq: Once | INTRAVENOUS | Status: AC
  Administered 2019-04-22: 70 mg via INTRAVENOUS
  Filled 2019-04-22: qty 7

## 2019-04-22 MED ORDER — HEPARIN SOD (PORK) LOCK FLUSH 100 UNIT/ML IV SOLN
500.0000 [IU] | Freq: Once | INTRAVENOUS | Status: AC | PRN
  Administered 2019-04-22: 500 [IU]
  Filled 2019-04-22: qty 5

## 2019-04-22 MED ORDER — SODIUM CHLORIDE 0.9% FLUSH
10.0000 mL | Freq: Once | INTRAVENOUS | Status: AC
  Administered 2019-04-22: 10 mL via INTRAVENOUS
  Filled 2019-04-22: qty 10

## 2019-04-22 NOTE — Progress Notes (Signed)
[facility]  [phone] [phone]   Name: Patrick Mcgee Date: 04/22/2019 MRN: [id number]  DOB: 09-17-1945  Patient Care Team: Jerrol Banana., MD as PCP - General (Family Medicine) Dingeldein, [name], MD as Consulting Physician (Ophthalmology) [name] as Consulting Physician (Internal Medicine) [name], MD (Inactive) as Consulting Physician (Pulmonary Disease) Sindy Guadeloupe, MD as Consulting Physician (Oncology)    REASON FOR CONSULTATION: Palliative Care consult requested for this 73 y.o. 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  PMH is also notable for mild interstitial lung disease, diabetes, and CAD status post previous stenting.  Patient was referred to palliative care to help address goals and manage ongoing symptoms.  SOCIAL HISTORY:     reports that he quit smoking about 40 years ago. His smoking use included cigars. He quit smokeless tobacco use about 30 years ago.  His smokeless tobacco use included chew. He reports previous alcohol use. He reports that he does not use drugs.   Patient is married and lives at home with his wife.  He has a daughter who lives nearby.  Patient formally worked at [employer] in [place]:  On file dated 12/28/2017  CODE STATUS:   PAST MEDICAL HISTORY: Past Medical History:  Diagnosis Date   Acid reflux    Anxiety    Depression    Diabetes mellitus without complication ([place])    Hyperlipidemia    Hypertension    MRSA (methicillin resistant Staphylococcus aureus) infection 1992   history   Myocardial infarction ([place]) 1995   Sleep apnea    CPAP   Urothelial cancer ([place]) 11/2017   Left Urothelial mass, chemo tx's    PAST SURGICAL HISTORY:  Past Surgical History:  Procedure Laterality Date   CATARACT  EXTRACTION Left    COLONOSCOPY  2010   [facility]   COLONOSCOPY WITH PROPOFOL N/A 10/04/2016   Procedure: COLONOSCOPY WITH PROPOFOL;  Surgeon: [name], MD;  Location: [facility];  Service: Endoscopy;  Laterality: N/A;   [place], 2000, 2001, 2014   CYSTOSCOPY W/ RETROGRADES Bilateral 12/10/2017   Procedure: CYSTOSCOPY WITH RETROGRADE PYELOGRAM;  Surgeon: [name], MD;  Location: [facility];  Service: Urology;  Laterality: Bilateral;   CYSTOSCOPY W/ RETROGRADES Left 12/09/2018   Procedure: CYSTOSCOPY WITH RETROGRADE PYELOGRAM;  Surgeon: [name], MD;  Location: [facility];  Service: Urology;  Laterality: Left;   CYSTOSCOPY WITH BIOPSY Left 12/09/2018   Procedure: CYSTOSCOPY WITH URETERAL/RENAL PELVIC BIOPSY;  Surgeon: [name], MD;  Location: [facility];  Service: Urology;  Laterality: Left;   CYSTOSCOPY WITH STENT PLACEMENT Left 12/10/2017   Procedure: CYSTOSCOPY WITH STENT PLACEMENT;  Surgeon: [name], MD;  Location: [facility];  Service: Urology;  Laterality: Left;   CYSTOSCOPY WITH STENT PLACEMENT Left 12/09/2018   Procedure: CYSTOSCOPY WITH STENT PLACEMENT;  Surgeon: [name], MD;  Location: [facility];  Service: Urology;  Laterality: Left;   CYSTOSCOPY WITH URETEROSCOPY Left 12/09/2018   Procedure: CYSTOSCOPY WITH URETEROSCOPY;  Surgeon: [name], MD;  Location: [facility];  Service: Urology;  Laterality: Left;   EYE SURGERY Bilateral    cataract   INGUINAL HERNIA REPAIR Right 08/09/2015   Procedure: HERNIA REPAIR INGUINAL ADULT;  Surgeon: [name], MD;  Location: [facility];  Service: General;  Laterality: Right;   NASAL SINUS SURGERY  nuclear stress test     PORTA CATH INSERTION N/A 12/19/2017   Procedure: PORTA CATH INSERTION;  Surgeon: [name], MD;  Location: [facility];  Service: Cardiovascular;  Laterality: N/A;   URETERAL BIOPSY Left 12/10/2017   Procedure: URETERAL & renal  PELVIS BIOPSY;  Surgeon: [name], MD;  Location: [facility];  Service: Urology;  Laterality: Left;   URETEROSCOPY Left 12/10/2017   Procedure: URETEROSCOPY;  Surgeon: [name], MD;  Location: [facility];  Service: Urology;  Laterality: Left;    HEMATOLOGY/ONCOLOGY HISTORY:  Oncology History Overview Note  Patrick Mcgee is a 73 y.o. 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  He presented with abdominal pain and weight loss.  Abdomen and pelvic CT on 12/01/2017 revealed a suspicious enhancing, filling defect involving the lower pole collecting system of the left kidney with asymmetric hypoenhancement of the inferior pole cortex of left kidney noted. Findings were worrisome for urothelial neoplasm.  There was abdominal and pelvic adenopathy compatible with nodal metastasis.  There was a 2.6 cm right para celiac node, 1,6 cm left retroperitoneal node, 1.4 cm right retroperitoneal node, 2.1 cm right common iliac node and 1 cm left external iliac node.  There was aortic atherosclerosis, kidney cysts, and prostate gland enlargement.  Chest CT on 12/07/2017 revealed no evidence of metastatic disease.    He underwent left ureteroscopy with renal pelvis biopsy and left ureteral stent placement on 12/10/2017.  Findings included a large nodular, vasculartumor within the left lower pole collecting system completely obstructing the entire left lower pole moiety. Ureter, right kidney, and bladder was normal.  Pathology revealed small fragments of high-grade urothelial carcinoma with a small focus of invasion.  He has diabetes and coronary artery disease s/p MI in 1995.  He is s/p stent plaecment x 5-6. Colonoscopy in 2018 was negative.      Urothelial carcinoma of kidney, left ([place])  12/07/2017 Initial Diagnosis   Urothelial carcinoma of kidney, left ([place])   12/17/2017 - 05/27/2018 Chemotherapy   The patient had palonosetron (ALOXI)  injection 0.25 mg, 0.25 mg, Intravenous,  Once, 8 of 10 cycles Administration: 0.25 mg (12/21/2017), 0.25 mg (01/14/2018), 0.25 mg (02/04/2018), 0.25 mg (02/25/2018), 0.25 mg (03/18/2018), 0.25 mg (04/08/2018), 0.25 mg (04/29/2018), 0.25 mg (05/27/2018) CARBOplatin (PARAPLATIN) 390 mg in sodium chloride 0.9 % 250 mL chemo infusion, 370 mg (100 % of original dose 370.4 mg), Intravenous,  Once, 8 of 10 cycles Dose modification:   (original dose 370.4 mg, Cycle 1, Reason: Provider Judgment),   (original dose 370.4 mg, Cycle 2, Reason: Other (see comments), Comment: Continue same dose as last cycle) Administration: 390 mg (12/21/2017), 390 mg (01/14/2018), 370 mg (02/04/2018), 370 mg (02/25/2018), 370 mg (03/18/2018), 370 mg (04/08/2018), 370 mg (04/29/2018), 370 mg (05/27/2018) gemcitabine (GEMZAR) 1,600 mg in sodium chloride 0.9 % 250 mL chemo infusion, 1,482 mg (80 % of original dose 1,000 mg/m2), Intravenous,  Once, 8 of 10 cycles Dose modification: 800 mg/m2 (original dose 1,000 mg/m2, Cycle 1, Reason: Provider Judgment), 800 mg/m2 (original dose 1,000 mg/m2, Cycle 2, Reason: Other (see comments), Comment: same dose as last cycle), 850 mg/m2 (original dose 1,000 mg/m2, Cycle 3, Reason: Provider Judgment, Comment: prior dose tolerated), 700 mg/m2 (original dose 1,000 mg/m2, Cycle 7, Reason: Provider Judgment), 750 mg/m2 (original dose 1,000 mg/m2, Cycle 7, Reason: Provider Judgment) Administration: 1,600 mg (12/21/2017), 1,600 mg (12/31/2017), 1,600 mg (01/14/2018), 1,600 mg (01/21/2018), 1,600 mg (02/04/2018), 1,600 mg (02/11/2018),  1,600 mg (02/25/2018), 1,600 mg (03/18/2018), 1,600 mg (04/08/2018), 1,600 mg (04/15/2018), 1,600 mg (04/29/2018), 1,400 mg (05/06/2018), 1,400 mg (05/27/2018) ondansetron (ZOFRAN) 8 mg in sodium chloride 0.9 % 50 mL IVPB, , Intravenous,  Once, 5 of 7 cycles Administration:  (01/21/2018)  for chemotherapy treatment.    01/14/2019 -  Chemotherapy   The patient had palonosetron (ALOXI) injection 0.25 mg,  0.25 mg, Intravenous,  Once, 4 of 4 cycles Administration: 0.25 mg (01/14/2019), 0.25 mg (02/11/2019), 0.25 mg (01/21/2019), 0.25 mg (01/28/2019), 0.25 mg (02/25/2019), 0.25 mg (03/11/2019), 0.25 mg (03/18/2019), 0.25 mg (04/22/2019), 0.25 mg (03/25/2019) enfortumab vedotin-ejfv (PADCEV) 70 mg in sodium chloride 0.9 % 50 mL (1.2281 mg/mL) chemo infusion, 1 mg/kg = 70 mg (100 % of original dose 1 mg/kg), Intravenous,  Once, 4 of 4 cycles Dose modification: 1 mg/kg (original dose 1 mg/kg, Cycle 1, Reason: Provider Judgment) Administration: 70 mg (01/14/2019), 70 mg (02/11/2019), 70 mg (01/21/2019), 70 mg (01/28/2019), 70 mg (02/25/2019), 70 mg (03/11/2019), 70 mg (03/18/2019), 70 mg (04/22/2019), 70 mg (03/25/2019)  for chemotherapy treatment.    Urothelial cancer ([place])  12/18/2017 Initial Diagnosis   Urothelial cancer ([place])   06/17/2018 - 01/14/2019 Chemotherapy   The patient had atezolizumab (TECENTRIQ) 1,200 mg in sodium chloride 0.9 % 250 mL chemo infusion, 1,200 mg, Intravenous, Once, 10 of 10 cycles Administration: 1,200 mg (06/17/2018), 1,200 mg (07/05/2018), 1,200 mg (07/29/2018), 1,200 mg (08/19/2018), 1,200 mg (09/30/2018), 1,200 mg (09/09/2018), 1,200 mg (10/21/2018), 1,200 mg (11/11/2018), 1,200 mg (12/03/2018), 1,200 mg (12/24/2018)  for chemotherapy treatment.      ALLERGIES:  is allergic to sulfa antibiotics; ace inhibitors; invokana [canagliflozin]; and penicillins.  MEDICATIONS:  Current Outpatient Medications  Medication Sig Dispense Refill   aspirin EC 81 MG tablet Take 81 mg by mouth every evening.      atorvastatin (LIPITOR) 40 MG tablet TAKE 1 TABLET BY MOUTH AT BEDTIME 90 tablet 4   Cholecalciferol (VITAMIN D3) 75 MCG (3000 UT) TABS Take by mouth.     clobetasol cream (TEMOVATE) 6.59 % Apply 1 application topically as needed (for skin rash). 30 g 1   empagliflozin (JARDIANCE) 10 MG TABS tablet Take 10 mg by mouth daily. (Patient taking differently: Take 10 mg by mouth daily as needed (elevated blood  sugar). ) 90 tablet 3   fluticasone (FLONASE) 50 MCG/ACT nasal spray Use 2 spray(s) in each nostril once daily (Patient taking differently: Place 2 sprays into both nostrils daily as needed for allergies. ) 48 g 3   gabapentin (NEURONTIN) 300 MG capsule TAKE 1 CAPSULE BY MOUTH THREE TIMES DAILY (START WITH 1 CAPSULE NIGHTLY THEN SLOWLY INCREASE TO TWICE DAILY THEN 3 TIMES DAILY OVER 7 DAYS) 90 capsule 0   glucose blood (CONTOUR NEXT TEST) test strip Check blood sugars 3 times daily. 300 each 11   hydrOXYzine (ATARAX/VISTARIL) 25 MG tablet Take 1 tablet (25 mg total) by mouth every 8 (eight) hours as needed for itching. 30 tablet 3   Ivermectin (SOOLANTRA) 1 % CREA Apply 1 application topically daily as needed (rosacea). To face     lidocaine-prilocaine (EMLA) cream Apply 1 application topically as needed (port access). 1 g 3   loratadine (CLARITIN) 10 MG tablet Take 10 mg by mouth daily as needed for allergies. [employer] brand allergy relief     losartan (COZAAR) 100 MG tablet TAKE 1 TABLET BY MOUTH ONCE DAILY 90 tablet 3   magic mouthwash w/lidocaine SOLN Take 5 mLs by mouth 4 (four) times daily as  needed for mouth pain. 480 mL 3   magnesium hydroxide (MILK OF MAGNESIA) 400 MG/5ML suspension Take 15 mLs by mouth daily as needed for mild constipation.     metFORMIN (GLUCOPHAGE) 1000 MG tablet TAKE 1 TABLET BY MOUTH TWICE DAILY WITH MEALS (Patient taking differently: Take 1,000 mg by mouth 2 (two) times a day. ) 180 tablet 3   nystatin cream (MYCOSTATIN) Apply topically 2 (two) times daily. (Patient taking differently: Apply 1 application topically 2 (two) times daily as needed (rash). ) 15 g 1   OVER THE COUNTER MEDICATION Apply 1 application topically daily as needed (pain). Outback topical pain relief     sertraline (ZOLOFT) 100 MG tablet Take 1 tablet (100 mg total) by mouth daily. 90 tablet 3   vitamin B-12 (CYANOCOBALAMIN) 1000 MCG tablet Take by mouth daily. Unsure dose      vitamin C (ASCORBIC ACID) 500 MG tablet Take 1,000-1,500 mg by mouth daily as needed (immune support).     No current facility-administered medications for this visit.    Facility-Administered Medications Ordered in Other Visits  Medication Dose Route Frequency Provider Last Rate Last Dose   Influenza vac split quadrivalent PF (FLUZONE HIGH-DOSE) injection 0.5 mL  0.5 mL Intramuscular Once Karen Kitchens, NP        VITAL SIGNS: There were no vitals taken for this visit. There were no vitals filed for this visit.  Estimated body mass index is 22.5 kg/m as calculated from the following:   Height as of an earlier encounter on 04/22/19: _0  (1.727 m).   Weight as of an earlier encounter on 04/22/19: 148 lb (67.1 kg).  LABS: CBC:    Component Value Date/Time   WBC 6.5 04/22/2019 0919   HGB 10.2 (L) 04/22/2019 0919   HGB 13.7 03/15/2017 1042   HCT 31.4 (L) 04/22/2019 0919   HCT 39.7 03/15/2017 1042   PLT 361 04/22/2019 0919   PLT 270 03/15/2017 1042   MCV 86.7 04/22/2019 0919   MCV 89 03/15/2017 1042   MCV 89 02/03/2013 2223   NEUTROABS 4.8 04/22/2019 0919   NEUTROABS 4.6 03/15/2017 1042   LYMPHSABS 1.1 04/22/2019 0919   LYMPHSABS 1.2 03/15/2017 1042   MONOABS 0.5 04/22/2019 0919   EOSABS 0.1 04/22/2019 0919   EOSABS 0.3 03/15/2017 1042   BASOSABS 0.0 04/22/2019 0919   BASOSABS 0.0 03/15/2017 1042   Comprehensive Metabolic Panel:    Component Value Date/Time   NA 135 04/22/2019 0919   NA 137 03/15/2017 1042   NA 137 02/03/2013 2223   K 4.3 04/22/2019 0919   K 4.0 02/03/2013 2223   CL 100 04/22/2019 0919   CL 106 02/03/2013 2223   CO2 26 04/22/2019 0919   CO2 28 02/03/2013 2223   BUN 17 04/22/2019 0919   BUN 9 03/15/2017 1042   BUN 11 02/03/2013 2223   CREATININE 0.63 04/22/2019 0919   CREATININE 0.69 02/03/2013 2223   GLUCOSE 216 (H) 04/22/2019 0919   GLUCOSE 158 (H) 02/03/2013 2223   CALCIUM 9.7 04/22/2019 0919   CALCIUM 9.3 02/03/2013 2223   AST 13 (L)  04/22/2019 0919   AST 26 02/03/2013 2223   ALT 12 04/22/2019 0919   ALT 30 02/03/2013 2223   ALKPHOS 91 04/22/2019 0919   ALKPHOS 121 02/03/2013 2223   BILITOT 0.6 04/22/2019 0919   BILITOT 1.5 (H) 03/15/2017 1042   BILITOT 1.1 (H) 02/03/2013 2223   PROT 7.2 04/22/2019 0919   PROT 6.9 03/15/2017 1042  PROT 7.5 02/03/2013 2223   ALBUMIN 3.4 (L) 04/22/2019 0919   ALBUMIN 4.3 03/15/2017 1042   ALBUMIN 3.3 (L) 02/03/2013 2223    RADIOGRAPHIC STUDIES: Ct Abdomen Pelvis W Contrast  Result Date: 03/31/2019 CLINICAL DATA:  Metastatic urothelial carcinoma. EXAM: CT ABDOMEN AND PELVIS WITH CONTRAST TECHNIQUE: Multidetector CT imaging of the abdomen and pelvis was performed using the standard protocol following bolus administration of intravenous contrast. CONTRAST:  40m OMNIPAQUE IOHEXOL 300 MG/ML  SOLN COMPARISON:  CT chest 03/03/2019 and CT abdomen pelvis 11/25/2018. FINDINGS: Lower chest: Lung bases show mild subpleural reticulation and ground-glass. Atherosclerotic calcification of the aorta and coronary arteries. Heart is at the upper limits of normal in size to mildly enlarged. Small to moderate pericardial effusion is new from 03/03/2019. No pleural effusion. Distal esophagus is grossly unremarkable. Hepatobiliary: Liver is unremarkable. Stones are seen in the gallbladder. No biliary ductal dilatation. Pancreas: Negative. Spleen: Negative. Adrenals/Urinary Tract: Adrenal glands are unremarkable. Low-attenuation lesions measure up to 3.8 cm on the right and are likely cysts although definitive characterization is limited without precontrast imaging, as some lesions measure up to 22 Hounsfield units. There is ill-defined decreased attenuation in the mid and left lower pole without a discrete measurable lesion, as on the prior exam. Associated mild atrophy. Ureters are decompressed. Bladder is grossly unremarkable. Stomach/Bowel: Stomach, small bowel, appendix and colon are unremarkable.  Vascular/Lymphatic: Atherosclerotic calcification of the aorta without aneurysm. Left periaortic lymph node measures 9 mm (2/38), decreased from 1.7 cm. No additional pathologically enlarged lymph nodes. Reproductive: Prostate is visualized. Other: No free fluid. Mesenteries and peritoneum are otherwise unremarkable. Musculoskeletal: Degenerative changes in the spine. No worrisome lytic or sclerotic lesions. IMPRESSION: 1. Ill-defined decreased attenuation in the mid and lower pole of the left kidney, without a discrete measurable lesion. Decrease in left periaortic adenopathy. Findings are indicative of treatment response. 2. Small to moderate pericardial effusion, new. 3. Mild subpleural fibrosis. 4. Cholelithiasis. 5. Aortic atherosclerosis (ICD10-170.0). Coronary artery calcification. Electronically Signed   By: MLorin PicketM.D.   On: 03/31/2019 10:09    PERFORMANCE STATUS (ECOG) : 2 - Symptomatic, <50% confined to bed  Review of Systems Unless otherwise noted, a complete review of systems is negative.  Physical Exam General: NAD, frail appearing, thin Pulmonary: Unlabored Extremities: no edema, no joint deformities Skin: no rashes Neurological: Weakness but otherwise nonfocal  IMPRESSION: I met with patient and his wife.  Introduced palliative care services and attempt to establish therapeutic rapport.  Both patient and wife also met with Dr. RJanese Bankstoday.  They discussed treatment options and have decided to pursue third line Padcev.   Patient says that his goal with treatment is to prolong life if possible but not at the cost of negatively impacting his quality of life.  He enjoys fishing and spending time with family.  Patient has experienced a decline in functional status in the past with treatment and is hoping that he can avoid similar decline in the future.  Functionally, patient reports he is independent with his care at home.  He has used assistive devices in the past but no  longer feels the need.  He continues to receive PT at home.  Overall, he feels stronger than he did several weeks ago.  He is also receiving acupuncture to help with his peripheral neuropathy.  Today, he feels he is doing reasonably well.  He denies any distressing symptoms.  He reports that he is coping well with his illness.  We discussed  his sleep pattern.  Historically, patient is often awake much of the night and sleeps during the day.  He describes himself as a "night owl".  Because of his sleep pattern, he generally only eats 1 meal a day.  Often this meal lacks meet and consist primarily of carbohydrates.  Patient has had a downtrend in his weight over the past several months.  He is eating drinking one Ensure a day.  Patient has been followed in the past by our dietitian and will ask that he be reevaluated.  Patient says that he has previously met with an attorney and has ACP documents established at home.  We did discuss CODE STATUS today.  Does not sound like patient be keen on aggressive measures at end-of-life.  I sent him in the wife home with a MOST Form to review, which we will complete during a later visit.  PLAN: -Continue current scope of treatment -MOST form reviewed -RTC in 1 month   Patient expressed understanding and was in agreement with this plan. He also understands that He can call the clinic at any time with any questions, concerns, or complaints.     Time Total: 45 minutes  Visit consisted of counseling and education dealing with the complex and emotionally intense issues of symptom management and palliative care in the setting of serious and potentially life-threatening illness.Greater than 50%  of this time was spent counseling and coordinating care related to the above assessment and plan.  Signed by: Altha Harm, PhD, NP-C [phone] (Work Cell)

## 2019-04-22 NOTE — Progress Notes (Signed)
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 [name] so far for his metastatic urothelial carcinoma. This was originally diagnosed in May 2019. He was noted to have a filling defect in the lower pole collecting system of the left kidney along with para-aortic and retroperitoneal adenopathy concerning for metastatic disease. He underwent left ureteroscopy and renal pelvis biopsy which revealed small fragments of high-grade urothelial carcinoma with small focus of invasion. He was started on carboplatin and gemcitabine chemotherapy in June 2019 and was continued on 05/27/2018 for 8 cycles. He tolerated chemotherapy well except for chemo-induced anemia for which she has been getting Procrit every 2 weeks. He did have response to his disease based on scans in August 2019. However repeat scan on 05/31/2018 showed increase in the size of the primary tumor from 1.2 to 1.9 cm and increase in left para-aortic adenopathy from 0.9 to 1.3 cm. Periportal adenopathy was stable at 1.4 cm. Second line immunotherapy was recommended. His initial biopsy specimen did not have enough sample to undergo FGFR mutation testing. He also has mild interstitial lung  disease for which he sees pulmonary but he is not on home oxygen. He has B12 deficiency for which he is on oral B12. Also has diabetes and coronary artery disease.Tecentriq started on 06/17/2018.Patient noted to have disease progression in his lymph nodes in May 2020. Repeat biopsy showed metastatic urothelial carcinoma which did not have aFGFR mutation. He has been started on third line Padcev   Interval history- feels that the 2 week break from treatment helped him get some of his energy back.  He has also been working with physical therapy and his right foot drop has improved.  ECOG PS- 1 Pain scale- 0 Opioid associated constipation- no  Review of systems- Review of Systems  Constitutional: Positive for malaise/fatigue. Negative for chills, fever and weight loss.  HENT: Negative for congestion, ear discharge and nosebleeds.   Eyes: Negative for blurred vision.  Respiratory: Negative for cough, hemoptysis, sputum production, shortness of breath and wheezing.   Cardiovascular: Negative for chest pain, palpitations, orthopnea and claudication.  Gastrointestinal: Negative for abdominal pain, blood in stool, constipation, diarrhea, heartburn, melena, nausea and vomiting.  Genitourinary: Negative for dysuria, flank pain, frequency, hematuria and urgency.  Musculoskeletal: Negative for back pain, joint pain and myalgias.  Skin: Negative for rash.  Neurological: Positive for sensory change (peripheral neuropathy). Negative for dizziness, tingling, focal weakness, seizures, weakness and headaches.  Endo/Heme/Allergies: Does not bruise/bleed easily.  Psychiatric/Behavioral: Negative for depression and suicidal ideas. The patient does not have insomnia.        Allergies  Allergen Reactions   Sulfa Antibiotics Other (See Comments)    Joint pain   Ace Inhibitors Cough   Invokana [Canagliflozin] Other (See Comments)    Leg pain   Penicillins Rash    Did it involve swelling  of the  face/tongue/throat, SOB, or low BP? No Did it involve sudden or severe rash/hives, skin peeling, or any reaction on the inside of your mouth or nose? Yes Did you need to seek medical attention at a hospital or doctor's office? Yes When did it last happen?childhood allergy If all above answers are "NO", may proceed with cephalosporin use.      Past Medical History:  Diagnosis Date   Acid reflux    Anxiety    Depression    Diabetes mellitus without complication ([place])    Hyperlipidemia    Hypertension    MRSA (methicillin resistant Staphylococcus aureus) infection 1992   history   Myocardial infarction ([place]) 1995   Sleep apnea    CPAP   Urothelial cancer ([place]) 11/2017   Left Urothelial mass, chemo tx's     Past Surgical History:  Procedure Laterality Date   CATARACT EXTRACTION Left    COLONOSCOPY  2010   [facility]   COLONOSCOPY WITH PROPOFOL N/A 10/04/2016   Procedure: COLONOSCOPY WITH PROPOFOL;  Surgeon: [name], MD;  Location: [place];  Service: Endoscopy;  Laterality: N/A;   [place], 2000, 2001, 2014   CYSTOSCOPY W/ RETROGRADES Bilateral 12/10/2017   Procedure: CYSTOSCOPY WITH RETROGRADE PYELOGRAM;  Surgeon: [name], MD;  Location: [facility];  Service: Urology;  Laterality: Bilateral;   CYSTOSCOPY W/ RETROGRADES Left 12/09/2018   Procedure: CYSTOSCOPY WITH RETROGRADE PYELOGRAM;  Surgeon: [name], MD;  Location: [facility];  Service: Urology;  Laterality: Left;   CYSTOSCOPY WITH BIOPSY Left 12/09/2018   Procedure: CYSTOSCOPY WITH URETERAL/RENAL PELVIC BIOPSY;  Surgeon: [name], MD;  Location: [facility];  Service: Urology;  Laterality: Left;   CYSTOSCOPY WITH STENT PLACEMENT Left 12/10/2017   Procedure: CYSTOSCOPY WITH STENT PLACEMENT;  Surgeon: [name], MD;  Location: [facility];  Service: Urology;  Laterality: Left;   CYSTOSCOPY WITH STENT PLACEMENT Left 12/09/2018   Procedure:  CYSTOSCOPY WITH STENT PLACEMENT;  Surgeon: [name], MD;  Location: [facility];  Service: Urology;  Laterality: Left;   CYSTOSCOPY WITH URETEROSCOPY Left 12/09/2018   Procedure: CYSTOSCOPY WITH URETEROSCOPY;  Surgeon: [name], MD;  Location: [facility];  Service: Urology;  Laterality: Left;   EYE SURGERY Bilateral    cataract   INGUINAL HERNIA REPAIR Right 08/09/2015   Procedure: HERNIA REPAIR INGUINAL ADULT;  Surgeon: [name], MD;  Location: [facility];  Service: General;  Laterality: Right;   NASAL SINUS SURGERY     nuclear stress test     PORTA CATH INSERTION N/A 12/19/2017   Procedure: PORTA CATH INSERTION;  Surgeon: [name], MD;  Location: [facility];  Service: Cardiovascular;  Laterality: N/A;   URETERAL BIOPSY Left 12/10/2017   Procedure: URETERAL & renal PELVIS BIOPSY;  Surgeon: [name], MD;  Location: [facility];  Service: Urology;  Laterality: Left;   URETEROSCOPY Left 12/10/2017   Procedure: URETEROSCOPY;  Surgeon: [name], MD;  Location: [facility];  Service: Urology;  Laterality: Left;    Social History   Socioeconomic History   Marital status: Married    Spouse name: [name]   Number of children: 1   Years of education: Not on file   Highest education level: Associate degree: occupational, Hotel manager, or vocational program  Occupational History   Occupation: retired  Scientist, product/process development strain: Not hard at all   Food insecurity    Worry: Never true    Inability:  Never true   Transportation needs    Medical: No    Non-medical: No  Tobacco Use   Smoking status: Former Smoker    Types: Cigars    Quit date: 10/09/1978    Years since quitting: 40.5   Smokeless tobacco: Former Systems developer    Types: Chew    Quit date: 12/04/1988   Tobacco comment: on occasion  Substance and Sexual Activity   Alcohol use: Not Currently   Drug use: No   Sexual activity: Not Currently  Lifestyle   Physical activity     Days per week: 0 days    Minutes per session: 0 min   Stress: Not at all  Relationships   Social connections    Talks on phone: Patient refused    Gets together: Patient refused    Attends religious service: Patient refused    Active member of club or organization: Patient refused    Attends meetings of clubs or organizations: Patient refused    Relationship status: Patient refused   Intimate partner violence    Fear of current or ex partner: Patient refused    Emotionally abused: Patient refused    Physically abused: Patient refused    Forced sexual activity: Patient refused  Other Topics Concern   Not on file  Social History Narrative   Not on file    Family History  Problem Relation Age of Onset   Heart disease Mother    Cancer Father        Lung and colon cancer   Heart disease Father    Emphysema Maternal Grandfather    Tuberculosis Maternal Grandmother      Current Outpatient Medications:    aspirin EC 81 MG tablet, Take 81 mg by mouth every evening. , Disp: , Rfl:    atorvastatin (LIPITOR) 40 MG tablet, TAKE 1 TABLET BY MOUTH AT BEDTIME, Disp: 90 tablet, Rfl: 4   Cholecalciferol (VITAMIN D3) 75 MCG (3000 UT) TABS, Take by mouth., Disp: , Rfl:    clobetasol cream (TEMOVATE) [id number] %, Apply 1 application topically as needed (for skin rash)., Disp: 30 g, Rfl: 1   empagliflozin (JARDIANCE) 10 MG TABS tablet, Take 10 mg by mouth daily. (Patient taking differently: Take 10 mg by mouth daily as needed (elevated blood sugar). ), Disp: 90 tablet, Rfl: 3   fluticasone (FLONASE) 50 MCG/ACT nasal spray, Use 2 spray(s) in each nostril once daily (Patient taking differently: Place 2 sprays into both nostrils daily as needed for allergies. ), Disp: 48 g, Rfl: 3   gabapentin (NEURONTIN) 300 MG capsule, TAKE 1 CAPSULE BY MOUTH THREE TIMES DAILY (START WITH 1 CAPSULE NIGHTLY THEN SLOWLY INCREASE TO TWICE DAILY THEN 3 TIMES DAILY OVER 7 DAYS), Disp: 90 capsule, Rfl: 0    glucose blood (CONTOUR NEXT TEST) test strip, Check blood sugars 3 times daily., Disp: 300 each, Rfl: 11   hydrOXYzine (ATARAX/VISTARIL) 25 MG tablet, Take 1 tablet (25 mg total) by mouth every 8 (eight) hours as needed for itching., Disp: 30 tablet, Rfl: 3   Ivermectin (SOOLANTRA) 1 % CREA, Apply 1 application topically daily as needed (rosacea). To face, Disp: , Rfl:    lidocaine-prilocaine (EMLA) cream, Apply 1 application topically as needed (port access)., Disp: 1 g, Rfl: 3   loratadine (CLARITIN) 10 MG tablet, Take 10 mg by mouth daily as needed for allergies. [employer] brand allergy relief, Disp: , Rfl:    losartan (COZAAR) 100 MG tablet, TAKE 1 TABLET BY MOUTH ONCE DAILY,  Disp: 90 tablet, Rfl: 3   magic mouthwash w/lidocaine SOLN, Take 5 mLs by mouth 4 (four) times daily as needed for mouth pain., Disp: 480 mL, Rfl: 3   magnesium hydroxide (MILK OF MAGNESIA) 400 MG/5ML suspension, Take 15 mLs by mouth daily as needed for mild constipation., Disp: , Rfl:    metFORMIN (GLUCOPHAGE) 1000 MG tablet, TAKE 1 TABLET BY MOUTH TWICE DAILY WITH MEALS (Patient taking differently: Take 1,000 mg by mouth 2 (two) times a day. ), Disp: 180 tablet, Rfl: 3   nystatin cream (MYCOSTATIN), Apply topically 2 (two) times daily. (Patient taking differently: Apply 1 application topically 2 (two) times daily as needed (rash). ), Disp: 15 g, Rfl: 1   OVER THE COUNTER MEDICATION, Apply 1 application topically daily as needed (pain). Outback topical pain relief, Disp: , Rfl:    sertraline (ZOLOFT) 100 MG tablet, Take 1 tablet (100 mg total) by mouth daily., Disp: 90 tablet, Rfl: 3   vitamin B-12 (CYANOCOBALAMIN) 1000 MCG tablet, Take by mouth daily. Unsure dose, Disp: , Rfl:    vitamin C (ASCORBIC ACID) 500 MG tablet, Take 1,000-1,500 mg by mouth daily as needed (immune support)., Disp: , Rfl:  No current facility-administered medications for this visit.   Facility-Administered Medications Ordered in Other  Visits:    dexamethasone (DECADRON) injection 10 mg, 10 mg, Intravenous, Once, Sindy Guadeloupe, MD   enfortumab vedotin-ejfv (PADCEV) 70 mg in sodium chloride 0.9 % 50 mL (1.2281 mg/mL) chemo infusion, 1 mg/kg (Treatment Plan Recorded), Intravenous, Once, Sindy Guadeloupe, MD   heparin lock flush 100 unit/mL, 500 Units, Intracatheter, Once PRN, Sindy Guadeloupe, MD   Influenza vac split quadrivalent PF (FLUZONE HIGH-DOSE) injection 0.5 mL, 0.5 mL, Intramuscular, Once, Karen Kitchens, NP   palonosetron (ALOXI) injection 0.25 mg, 0.25 mg, Intravenous, Once, Sindy Guadeloupe, MD  Physical exam:  Vitals:   04/22/19 0954  BP: 99/70  Pulse: 81  Resp: 16  Temp: (!) 97.1 F (36.2 C)  TempSrc: Tympanic  Weight: 148 lb (67.1 kg)  Height: 5\' 8"  (1.727 m)   Physical Exam Constitutional:      Comments: Elderly frail gentleman who appears fatigued  HENT:     Head: Normocephalic and atraumatic.  Eyes:     Pupils: Pupils are equal, round, and reactive to light.  Neck:     Musculoskeletal: Normal range of motion.  Cardiovascular:     Rate and Rhythm: Normal rate and regular rhythm.     Heart sounds: Normal heart sounds.  Pulmonary:     Effort: Pulmonary effort is normal.     Breath sounds: Normal breath sounds.  Abdominal:     General: Bowel sounds are normal.     Palpations: Abdomen is soft.  Musculoskeletal:     Comments: Right foot drop which has improved after physical therapy  Skin:    General: Skin is warm and dry.  Neurological:     Mental Status: He is alert and oriented to person, place, and time.      CMP Latest Ref Rng & Units 04/22/2019  Glucose 70 - 99 mg/dL 216(H)  BUN 8 - 23 mg/dL 17  Creatinine 0.61 - 1.24 mg/dL 0.63  Sodium 135 - 145 mmol/L 135  Potassium 3.5 - 5.1 mmol/L 4.3  Chloride 98 - 111 mmol/L 100  CO2 22 - 32 mmol/L 26  Calcium 8.9 - 10.3 mg/dL 9.7  Total Protein 6.5 - 8.1 g/dL 7.2  Total Bilirubin 0.3 - 1.2 mg/dL  0.6  Alkaline Phos 38 - 126 U/L 91  AST  15 - 41 U/L 13(L)  ALT 0 - 44 U/L 12   CBC Latest Ref Rng & Units 04/22/2019  WBC 4.0 - 10.5 K/uL 6.5  Hemoglobin 13.0 - 17.0 g/dL 10.2(L)  Hematocrit 39.0 - 52.0 % 31.4(L)  Platelets 150 - 400 K/uL 361    No images are attached to the encounter.  Ct Abdomen Pelvis W Contrast  Result Date: 03/31/2019 CLINICAL DATA:  Metastatic urothelial carcinoma. EXAM: CT ABDOMEN AND PELVIS WITH CONTRAST TECHNIQUE: Multidetector CT imaging of the abdomen and pelvis was performed using the standard protocol following bolus administration of intravenous contrast. CONTRAST:  41mL OMNIPAQUE IOHEXOL 300 MG/ML  SOLN COMPARISON:  CT chest 03/03/2019 and CT abdomen pelvis 11/25/2018. FINDINGS: Lower chest: Lung bases show mild subpleural reticulation and ground-glass. Atherosclerotic calcification of the aorta and coronary arteries. Heart is at the upper limits of normal in size to mildly enlarged. Small to moderate pericardial effusion is new from 03/03/2019. No pleural effusion. Distal esophagus is grossly unremarkable. Hepatobiliary: Liver is unremarkable. Stones are seen in the gallbladder. No biliary ductal dilatation. Pancreas: Negative. Spleen: Negative. Adrenals/Urinary Tract: Adrenal glands are unremarkable. Low-attenuation lesions measure up to 3.8 cm on the right and are likely cysts although definitive characterization is limited without precontrast imaging, as some lesions measure up to 22 Hounsfield units. There is ill-defined decreased attenuation in the mid and left lower pole without a discrete measurable lesion, as on the prior exam. Associated mild atrophy. Ureters are decompressed. Bladder is grossly unremarkable. Stomach/Bowel: Stomach, small bowel, appendix and colon are unremarkable. Vascular/Lymphatic: Atherosclerotic calcification of the aorta without aneurysm. Left periaortic lymph node measures 9 mm (2/38), decreased from 1.7 cm. No additional pathologically enlarged lymph nodes. Reproductive:  Prostate is visualized. Other: No free fluid. Mesenteries and peritoneum are otherwise unremarkable. Musculoskeletal: Degenerative changes in the spine. No worrisome lytic or sclerotic lesions. IMPRESSION: 1. Ill-defined decreased attenuation in the mid and lower pole of the left kidney, without a discrete measurable lesion. Decrease in left periaortic adenopathy. Findings are indicative of treatment response. 2. Small to moderate pericardial effusion, new. 3. Mild subpleural fibrosis. 4. Cholelithiasis. 5. Aortic atherosclerosis (ICD10-170.0). Coronary artery calcification. Electronically Signed   By: Lorin Picket M.D.   On: 03/31/2019 10:09     Assessment and plan- Patient is a 73 y.o. male with history of metastatic urothelial carcinoma with metastases to the lymph nodes.He had disease progression on carboplatin/gemcitabine as well as second line Tecentriq.he is here for on treatment assessment prior to cycle 5 day 1 of padcev  We revisited our options including doing a modified version of padcev versus best supportive care.  I discussed this in great detail with the patient and his wife.  We have agreed upon continuing with Padcev at a reduced dose of 1 mg per metered squared 2 weeks on and 2 weeks off.  Patient would like to 2 weeks of to get his energy levels better before proceeding with the next cycle.  He understands that if he does not tolerate this treatment well he does not wish to receive any chemotherapy in the future and would agree to hospice at that point.  Counts are okay to proceed with cycle 5-day 1 of padcev today.  He will receive cycle 5-day 8 next week and I will see him back in 4 weeks for cycle 6-day 1.  Recent scans did show good response to treatment  Chemo-induced peripheral neuropathy:  He is currently on gabapentin 300 mg 3 times daily.  He is also going through acupuncture.  He is working with physical therapy for his right foot drop.   Total face to face encounter  time for this patient visit was 30 min. >50% of the time was  spent in counseling and coordination of care.     Visit Diagnosis 1. Encounter for antineoplastic chemotherapy   2. Urothelial carcinoma of kidney, left ([place])   3. Goals of care, counseling/discussion      Dr. Randa Evens, MD, MPH [facility] [id number] 04/22/2019 11:04 AM

## 2019-04-24 ENCOUNTER — Telehealth: Payer: Self-pay

## 2019-04-24 DIAGNOSIS — R530 Neoplastic (malignant) related fatigue: Secondary | ICD-10-CM | POA: Diagnosis not present

## 2019-04-24 DIAGNOSIS — D6481 Anemia due to antineoplastic chemotherapy: Secondary | ICD-10-CM | POA: Diagnosis not present

## 2019-04-24 DIAGNOSIS — T451X5D Adverse effect of antineoplastic and immunosuppressive drugs, subsequent encounter: Secondary | ICD-10-CM | POA: Diagnosis not present

## 2019-04-24 DIAGNOSIS — G620 Drug-induced polyneuropathy: Secondary | ICD-10-CM | POA: Diagnosis not present

## 2019-04-24 DIAGNOSIS — D642 Secondary sideroblastic anemia due to drugs and toxins: Secondary | ICD-10-CM | POA: Diagnosis not present

## 2019-04-24 DIAGNOSIS — C772 Secondary and unspecified malignant neoplasm of intra-abdominal lymph nodes: Secondary | ICD-10-CM | POA: Diagnosis not present

## 2019-04-24 NOTE — Telephone Encounter (Signed)
Called patient to give him an update about CPAP supplies. Left message to call back.

## 2019-04-24 NOTE — Telephone Encounter (Signed)
Telephone call to patient to schedule palliative care visit.  Patient in agreement with palliatuve team visiting on 04-29-19 at 1:00 PM.

## 2019-04-25 DIAGNOSIS — G620 Drug-induced polyneuropathy: Secondary | ICD-10-CM | POA: Diagnosis not present

## 2019-04-25 DIAGNOSIS — D642 Secondary sideroblastic anemia due to drugs and toxins: Secondary | ICD-10-CM | POA: Diagnosis not present

## 2019-04-25 DIAGNOSIS — D6481 Anemia due to antineoplastic chemotherapy: Secondary | ICD-10-CM | POA: Diagnosis not present

## 2019-04-25 DIAGNOSIS — R530 Neoplastic (malignant) related fatigue: Secondary | ICD-10-CM | POA: Diagnosis not present

## 2019-04-25 DIAGNOSIS — C772 Secondary and unspecified malignant neoplasm of intra-abdominal lymph nodes: Secondary | ICD-10-CM | POA: Diagnosis not present

## 2019-04-25 DIAGNOSIS — T451X5D Adverse effect of antineoplastic and immunosuppressive drugs, subsequent encounter: Secondary | ICD-10-CM | POA: Diagnosis not present

## 2019-04-29 ENCOUNTER — Other Ambulatory Visit: Payer: Self-pay

## 2019-04-29 ENCOUNTER — Inpatient Hospital Stay: Payer: Medicare Other | Admitting: *Deleted

## 2019-04-29 ENCOUNTER — Inpatient Hospital Stay: Payer: Medicare Other

## 2019-04-29 ENCOUNTER — Other Ambulatory Visit: Payer: Medicare Other

## 2019-04-29 VITALS — BP 113/71 | HR 71 | Temp 96.0°F | Resp 18 | Wt 152.2 lb

## 2019-04-29 DIAGNOSIS — C652 Malignant neoplasm of left renal pelvis: Secondary | ICD-10-CM | POA: Diagnosis not present

## 2019-04-29 DIAGNOSIS — C642 Malignant neoplasm of left kidney, except renal pelvis: Secondary | ICD-10-CM

## 2019-04-29 DIAGNOSIS — T451X5A Adverse effect of antineoplastic and immunosuppressive drugs, initial encounter: Secondary | ICD-10-CM

## 2019-04-29 DIAGNOSIS — Z95828 Presence of other vascular implants and grafts: Secondary | ICD-10-CM

## 2019-04-29 DIAGNOSIS — C772 Secondary and unspecified malignant neoplasm of intra-abdominal lymph nodes: Secondary | ICD-10-CM | POA: Diagnosis not present

## 2019-04-29 DIAGNOSIS — D701 Agranulocytosis secondary to cancer chemotherapy: Secondary | ICD-10-CM

## 2019-04-29 DIAGNOSIS — Z5112 Encounter for antineoplastic immunotherapy: Secondary | ICD-10-CM | POA: Diagnosis not present

## 2019-04-29 DIAGNOSIS — Z515 Encounter for palliative care: Secondary | ICD-10-CM

## 2019-04-29 LAB — COMPREHENSIVE METABOLIC PANEL
ALT: 15 U/L (ref 0–44)
AST: 20 U/L (ref 15–41)
Albumin: 3.5 g/dL (ref 3.5–5.0)
Alkaline Phosphatase: 77 U/L (ref 38–126)
Anion gap: 8 (ref 5–15)
BUN: 22 mg/dL (ref 8–23)
CO2: 27 mmol/L (ref 22–32)
Calcium: 9.5 mg/dL (ref 8.9–10.3)
Chloride: 102 mmol/L (ref 98–111)
Creatinine, Ser: 0.58 mg/dL — ABNORMAL LOW (ref 0.61–1.24)
GFR calc Af Amer: >60 mL/min (ref >60)
GFR calc non Af Amer: >60 mL/min (ref >60)
Glucose, Bld: 165 mg/dL — ABNORMAL HIGH (ref 70–99)
Potassium: 4.2 mmol/L (ref 3.5–5.1)
Sodium: 137 mmol/L (ref 135–145)
Total Bilirubin: 0.5 mg/dL (ref 0.3–1.2)
Total Protein: 7.2 g/dL (ref 6.5–8.1)

## 2019-04-29 LAB — CBC WITH DIFFERENTIAL/PLATELET
Abs Immature Granulocytes: 0.02 10*3/uL (ref 0.00–0.07)
Basophils Absolute: 0.1 10*3/uL (ref 0.0–0.1)
Basophils Relative: 1 %
Eosinophils Absolute: 0.3 10*3/uL (ref 0.0–0.5)
Eosinophils Relative: 3 %
HCT: 33.7 % — ABNORMAL LOW (ref 39.0–52.0)
Hemoglobin: 10.7 g/dL — ABNORMAL LOW (ref 13.0–17.0)
Immature Granulocytes: 0 %
Lymphocytes Relative: 20 %
Lymphs Abs: 1.6 10*3/uL (ref 0.7–4.0)
MCH: 28.2 pg (ref 26.0–34.0)
MCHC: 31.8 g/dL (ref 30.0–36.0)
MCV: 88.7 fL (ref 80.0–100.0)
Monocytes Absolute: 0.7 10*3/uL (ref 0.1–1.0)
Monocytes Relative: 9 %
Neutro Abs: 5.3 10*3/uL (ref 1.7–7.7)
Neutrophils Relative %: 67 %
Platelets: 292 10*3/uL (ref 150–400)
RBC: 3.8 MIL/uL — ABNORMAL LOW (ref 4.22–5.81)
RDW: 15.9 % — ABNORMAL HIGH (ref 11.5–15.5)
WBC: 7.9 10*3/uL (ref 4.0–10.5)
nRBC: 0 % (ref 0.0–0.2)

## 2019-04-29 MED ORDER — HEPARIN SOD (PORK) LOCK FLUSH 100 UNIT/ML IV SOLN
500.0000 [IU] | Freq: Once | INTRAVENOUS | Status: AC | PRN
  Administered 2019-04-29: 500 [IU]
  Filled 2019-04-29: qty 5

## 2019-04-29 MED ORDER — PALONOSETRON HCL INJECTION 0.25 MG/5ML
0.2500 mg | Freq: Once | INTRAVENOUS | Status: AC
  Administered 2019-04-29: 0.25 mg via INTRAVENOUS
  Filled 2019-04-29: qty 5

## 2019-04-29 MED ORDER — SODIUM CHLORIDE 0.9 % IV SOLN
Freq: Once | INTRAVENOUS | Status: AC
  Administered 2019-04-29: 09:00:00 via INTRAVENOUS
  Filled 2019-04-29: qty 250

## 2019-04-29 MED ORDER — DEXAMETHASONE SODIUM PHOSPHATE 10 MG/ML IJ SOLN
10.0000 mg | Freq: Once | INTRAMUSCULAR | Status: AC
  Administered 2019-04-29: 10 mg via INTRAVENOUS
  Filled 2019-04-29: qty 1

## 2019-04-29 MED ORDER — SODIUM CHLORIDE 0.9 % IV SOLN
1.0000 mg/kg | Freq: Once | INTRAVENOUS | Status: AC
  Administered 2019-04-29: 70 mg via INTRAVENOUS
  Filled 2019-04-29: qty 6

## 2019-04-29 MED ORDER — SODIUM CHLORIDE 0.9% FLUSH
10.0000 mL | Freq: Once | INTRAVENOUS | Status: AC
  Administered 2019-04-29: 09:00:00 10 mL via INTRAVENOUS
  Filled 2019-04-29: qty 10

## 2019-04-29 NOTE — Telephone Encounter (Signed)
Faxed request on 04/25/2019.

## 2019-04-29 NOTE — Progress Notes (Signed)
PATIENT NAME: Patrick Mcgee DOB: 04-05-1946 MRN: [id number]  PRIMARY CARE PROVIDER: Jerrol Banana., MD  RESPONSIBLE PARTY:  Acct ID - Guarantor Home Phone Work Phone Relationship Acct Type  [id number] ARMON, ORVIS [phone]  Self P/F     [address], [place], [place] [address]    PLAN OF CARE and INTERVENTIONS:               1.  GOALS OF CARE/ ADVANCE CARE PLANNING:  Remain at home with wife and continue to receive chemo.                2.  PATIENT/CAREGIVER EDUCATION:  Education on fall precautions, reviewed meds, education on side effects of chemo.               3.  DISEASE STATUS: SW and RN made joint home visit. Patient and wife met palliative care team at door. Patient was seen this morning at the [facility]. Patient and wife report patient is to receive chemo for 2 weeks then be off chemo for 2 weeks. Patient states the weeks he receives chemo by Thursday and Friday he is worn out and has no energy. Patient denies having pain at the present time. Patient is receiving PT in the home and reports he is no longer having to ambulate with his cane. Patient reports he feels stronger and more steady on his feet. Patient is also receiving acupuncture for numbness in his hands and feet from chemotherapy. Patient states he feels like acupuncture has made a difference with numbness. Patient and wife talk about meeting McMurray NP at the [facility] Patient and wife report Merrily Pew made recommendation for patient to try to be on a schedule as well as information on how to make milkshakes out of nutritional supplements. Patient reports he has been sleeping well. Patient denies having any cough or shortness of breath. Patient states he was informed by MD that people with his cancer diagnosis typically live 18 to 24 months. Patient was diagnosed in May of 2019. Patient has received flu shot for this season. Patient wishes to be a DNR and patient has DNR and living will in  home. Social worker scanned advance directives into EPIC system. Patient and wife appreciative of palliative visit and remain in agreement with palliative care. Patient and wife encouraged to contact palliative care with questions or concerns.     HISTORY OF PRESENT ILLNESS:    CODE STATUS: DNR  ADVANCED DIRECTIVES: Y MOST FORM: No PPS: 50%   PHYSICAL EXAM:   VITALS: Today's Vitals   04/29/19 1330  BP: 118/78  Pulse: 90  Resp: 18  Temp: 97.7 F (36.5 C)  TempSrc: Temporal  SpO2: 96%  Weight: 152 lb 6.4 oz (69.1 kg)  PainSc: 0-No pain    LUNGS: clear to auscultation  CARDIAC: Cor RRR  EXTREMITIES: Trace edema SKIN: Skin color, texture, turgor normal. No rashes or lesions  NEURO: Alert and oriented x 4       Nilda Simmer, RN

## 2019-04-29 NOTE — Progress Notes (Signed)
COMMUNITY PALLIATIVE CARE SW NOTE  PATIENT NAME: Patrick Mcgee DOB: 1945-09-04 MRN: [id number]  PRIMARY CARE PROVIDER: Jerrol Mcgee., MD  RESPONSIBLE PARTY:  Acct ID - Guarantor Home Phone Work Phone Relationship Acct Type  [id number] Patrick Mcgee, Patrick Mcgee [phone]  Self P/F     Patrick Mcgee, Patrick Mcge[place], [name] [address]     PLAN OF CARE and INTERVENTIONS:             1. GOALS OF CARE/ ADVANCE CARE PLANNING:  Patient's goal is to continuewithtreatmentat this time and maintain his quality of life.Patient is DNR, form is in the home. Patient has completed ACP, HCPOA is Patrick Mcgee (patient's daughter). Patient updated documents in [place]. Discussing MOST with wife.   2. SOCIAL/EMOTIONAL/SPIRITUAL ASSESSMENT/ INTERVENTIONS:  SW and RN completed home visit with patient and Patrick Mcgee (patient's wife). Patient denies pain. Patient denies nausea. Patient's walking and strength has improved. Patient does have fatigue at the end of his week of treatment. Patient said he is sleeping well. Patient is focusing on improving his appetite and food schedule. Patient is also trying to mix Ensures with new things (peanut butter, Mcgee, chocolate syrup).  3. PATIENT/CAREGIVER EDUCATION/ COPING:  Patient was alert, oriented. Patient and Patrick Mcgee are coping well. Patient is enjoying time with family - recently went fishing with his brother-in-law and son-in-law and was able to visit with cousins outside at his daughter's home. Patient has supportive family. Patient also copes with prayer and faith.  4. PERSONAL EMERGENCY PLAN:  Patient and family will call 9-1-1 for emergencies. 5. COMMUNITY RESOURCES COORDINATION/ HEALTH CARE NAVIGATION:  Patient is receiving PT in the home. Patient is going to get acupuncture for his neuropathy. Patient continues to go to treatments at the cancer center, two weeks of treatment and two weeks off. Patient did see Patrick Harm, NP last week and discussed goals. Patrick Mcgee supports  coordinating all of patient's care. 6. FINANCIAL/LEGAL CONCERNS/INTERVENTIONS:  None.     SOCIAL HX:  Social History   Tobacco Use  . Smoking status: Former Smoker    Types: Cigars    Quit date: 10/09/1978    Years since quitting: 40.5  . Smokeless tobacco: Former Systems developer    Types: Chew    Quit date: 12/04/1988  . Tobacco comment: on occasion  Substance Use Topics  . Alcohol use: Not Currently    CODE STATUS:   Code Status: Prior (DNR) ADVANCED DIRECTIVES: Y MOST FORM COMPLETE:  Reviewing form, no questions at this time. HOSPICE EDUCATION PROVIDED: None.  PPS: Patient is independent of ADLs, but does need standby assist. Continuing PT at home.   I spent24minutes with patient/family, from1:00-2:00pproviding education, support and consultation.  Patrick Lombard, LCSW

## 2019-05-01 DIAGNOSIS — T451X5D Adverse effect of antineoplastic and immunosuppressive drugs, subsequent encounter: Secondary | ICD-10-CM | POA: Diagnosis not present

## 2019-05-01 DIAGNOSIS — G620 Drug-induced polyneuropathy: Secondary | ICD-10-CM | POA: Diagnosis not present

## 2019-05-01 DIAGNOSIS — D642 Secondary sideroblastic anemia due to drugs and toxins: Secondary | ICD-10-CM | POA: Diagnosis not present

## 2019-05-01 DIAGNOSIS — R530 Neoplastic (malignant) related fatigue: Secondary | ICD-10-CM | POA: Diagnosis not present

## 2019-05-01 DIAGNOSIS — D6481 Anemia due to antineoplastic chemotherapy: Secondary | ICD-10-CM | POA: Diagnosis not present

## 2019-05-01 DIAGNOSIS — C772 Secondary and unspecified malignant neoplasm of intra-abdominal lymph nodes: Secondary | ICD-10-CM | POA: Diagnosis not present

## 2019-05-07 DIAGNOSIS — I10 Essential (primary) hypertension: Secondary | ICD-10-CM | POA: Diagnosis not present

## 2019-05-07 DIAGNOSIS — J849 Interstitial pulmonary disease, unspecified: Secondary | ICD-10-CM | POA: Diagnosis not present

## 2019-05-07 DIAGNOSIS — D6481 Anemia due to antineoplastic chemotherapy: Secondary | ICD-10-CM | POA: Diagnosis not present

## 2019-05-07 DIAGNOSIS — R530 Neoplastic (malignant) related fatigue: Secondary | ICD-10-CM | POA: Diagnosis not present

## 2019-05-07 DIAGNOSIS — I2510 Atherosclerotic heart disease of native coronary artery without angina pectoris: Secondary | ICD-10-CM | POA: Diagnosis not present

## 2019-05-07 DIAGNOSIS — C772 Secondary and unspecified malignant neoplasm of intra-abdominal lymph nodes: Secondary | ICD-10-CM | POA: Diagnosis not present

## 2019-05-07 DIAGNOSIS — T451X5D Adverse effect of antineoplastic and immunosuppressive drugs, subsequent encounter: Secondary | ICD-10-CM | POA: Diagnosis not present

## 2019-05-07 DIAGNOSIS — Z955 Presence of coronary angioplasty implant and graft: Secondary | ICD-10-CM | POA: Diagnosis not present

## 2019-05-07 DIAGNOSIS — D642 Secondary sideroblastic anemia due to drugs and toxins: Secondary | ICD-10-CM | POA: Diagnosis not present

## 2019-05-07 DIAGNOSIS — G620 Drug-induced polyneuropathy: Secondary | ICD-10-CM | POA: Diagnosis not present

## 2019-05-07 DIAGNOSIS — E785 Hyperlipidemia, unspecified: Secondary | ICD-10-CM | POA: Diagnosis not present

## 2019-05-07 DIAGNOSIS — Z79899 Other long term (current) drug therapy: Secondary | ICD-10-CM | POA: Diagnosis not present

## 2019-05-07 DIAGNOSIS — Z87891 Personal history of nicotine dependence: Secondary | ICD-10-CM | POA: Diagnosis not present

## 2019-05-07 DIAGNOSIS — G4730 Sleep apnea, unspecified: Secondary | ICD-10-CM | POA: Diagnosis not present

## 2019-05-07 DIAGNOSIS — F419 Anxiety disorder, unspecified: Secondary | ICD-10-CM | POA: Diagnosis not present

## 2019-05-07 DIAGNOSIS — F329 Major depressive disorder, single episode, unspecified: Secondary | ICD-10-CM | POA: Diagnosis not present

## 2019-05-07 DIAGNOSIS — Z7984 Long term (current) use of oral hypoglycemic drugs: Secondary | ICD-10-CM | POA: Diagnosis not present

## 2019-05-07 DIAGNOSIS — Z7982 Long term (current) use of aspirin: Secondary | ICD-10-CM | POA: Diagnosis not present

## 2019-05-07 DIAGNOSIS — E119 Type 2 diabetes mellitus without complications: Secondary | ICD-10-CM | POA: Diagnosis not present

## 2019-05-07 DIAGNOSIS — Z8614 Personal history of Methicillin resistant Staphylococcus aureus infection: Secondary | ICD-10-CM | POA: Diagnosis not present

## 2019-05-07 DIAGNOSIS — Z9181 History of falling: Secondary | ICD-10-CM | POA: Diagnosis not present

## 2019-05-07 DIAGNOSIS — K219 Gastro-esophageal reflux disease without esophagitis: Secondary | ICD-10-CM | POA: Diagnosis not present

## 2019-05-15 DIAGNOSIS — G620 Drug-induced polyneuropathy: Secondary | ICD-10-CM | POA: Diagnosis not present

## 2019-05-15 DIAGNOSIS — D6481 Anemia due to antineoplastic chemotherapy: Secondary | ICD-10-CM | POA: Diagnosis not present

## 2019-05-15 DIAGNOSIS — T451X5D Adverse effect of antineoplastic and immunosuppressive drugs, subsequent encounter: Secondary | ICD-10-CM | POA: Diagnosis not present

## 2019-05-15 DIAGNOSIS — R530 Neoplastic (malignant) related fatigue: Secondary | ICD-10-CM | POA: Diagnosis not present

## 2019-05-15 DIAGNOSIS — D642 Secondary sideroblastic anemia due to drugs and toxins: Secondary | ICD-10-CM | POA: Diagnosis not present

## 2019-05-15 DIAGNOSIS — C772 Secondary and unspecified malignant neoplasm of intra-abdominal lymph nodes: Secondary | ICD-10-CM | POA: Diagnosis not present

## 2019-05-19 ENCOUNTER — Encounter: Payer: Self-pay | Admitting: Oncology

## 2019-05-19 ENCOUNTER — Other Ambulatory Visit: Payer: Self-pay

## 2019-05-19 NOTE — Progress Notes (Signed)
Patient stated that he had been doing well with no concerns. 

## 2019-05-20 ENCOUNTER — Other Ambulatory Visit: Payer: Self-pay

## 2019-05-20 ENCOUNTER — Inpatient Hospital Stay (HOSPITAL_BASED_OUTPATIENT_CLINIC_OR_DEPARTMENT_OTHER): Payer: Medicare Other | Admitting: Oncology

## 2019-05-20 ENCOUNTER — Inpatient Hospital Stay: Payer: Medicare Other | Attending: Oncology | Admitting: *Deleted

## 2019-05-20 ENCOUNTER — Inpatient Hospital Stay: Payer: Medicare Other

## 2019-05-20 ENCOUNTER — Inpatient Hospital Stay (HOSPITAL_BASED_OUTPATIENT_CLINIC_OR_DEPARTMENT_OTHER): Payer: Medicare Other | Admitting: Hospice and Palliative Medicine

## 2019-05-20 ENCOUNTER — Other Ambulatory Visit: Payer: Self-pay | Admitting: *Deleted

## 2019-05-20 VITALS — BP 115/84 | HR 77 | Temp 96.1°F | Resp 16 | Wt 148.8 lb

## 2019-05-20 DIAGNOSIS — T451X5A Adverse effect of antineoplastic and immunosuppressive drugs, initial encounter: Secondary | ICD-10-CM

## 2019-05-20 DIAGNOSIS — Z95828 Presence of other vascular implants and grafts: Secondary | ICD-10-CM

## 2019-05-20 DIAGNOSIS — C652 Malignant neoplasm of left renal pelvis: Secondary | ICD-10-CM | POA: Insufficient documentation

## 2019-05-20 DIAGNOSIS — I25119 Atherosclerotic heart disease of native coronary artery with unspecified angina pectoris: Secondary | ICD-10-CM | POA: Diagnosis not present

## 2019-05-20 DIAGNOSIS — D701 Agranulocytosis secondary to cancer chemotherapy: Secondary | ICD-10-CM | POA: Diagnosis not present

## 2019-05-20 DIAGNOSIS — C772 Secondary and unspecified malignant neoplasm of intra-abdominal lymph nodes: Secondary | ICD-10-CM | POA: Insufficient documentation

## 2019-05-20 DIAGNOSIS — C642 Malignant neoplasm of left kidney, except renal pelvis: Secondary | ICD-10-CM | POA: Diagnosis not present

## 2019-05-20 DIAGNOSIS — Z5111 Encounter for antineoplastic chemotherapy: Secondary | ICD-10-CM | POA: Diagnosis not present

## 2019-05-20 DIAGNOSIS — G620 Drug-induced polyneuropathy: Secondary | ICD-10-CM

## 2019-05-20 DIAGNOSIS — Z5112 Encounter for antineoplastic immunotherapy: Secondary | ICD-10-CM | POA: Insufficient documentation

## 2019-05-20 DIAGNOSIS — Z515 Encounter for palliative care: Secondary | ICD-10-CM | POA: Diagnosis not present

## 2019-05-20 LAB — CBC WITH DIFFERENTIAL/PLATELET
Abs Immature Granulocytes: 0 10*3/uL (ref 0.00–0.07)
Basophils Absolute: 0.1 10*3/uL (ref 0.0–0.1)
Basophils Relative: 1 %
Eosinophils Absolute: 0.1 10*3/uL (ref 0.0–0.5)
Eosinophils Relative: 2 %
HCT: 33 % — ABNORMAL LOW (ref 39.0–52.0)
Hemoglobin: 10.7 g/dL — ABNORMAL LOW (ref 13.0–17.0)
Immature Granulocytes: 0 %
Lymphocytes Relative: 24 %
Lymphs Abs: 1.4 10*3/uL (ref 0.7–4.0)
MCH: 28.1 pg (ref 26.0–34.0)
MCHC: 32.4 g/dL (ref 30.0–36.0)
MCV: 86.6 fL (ref 80.0–100.0)
Monocytes Absolute: 0.5 10*3/uL (ref 0.1–1.0)
Monocytes Relative: 9 %
Neutro Abs: 3.6 10*3/uL (ref 1.7–7.7)
Neutrophils Relative %: 64 %
Platelets: 316 10*3/uL (ref 150–400)
RBC: 3.81 MIL/uL — ABNORMAL LOW (ref 4.22–5.81)
RDW: 16.2 % — ABNORMAL HIGH (ref 11.5–15.5)
WBC: 5.7 10*3/uL (ref 4.0–10.5)
nRBC: 0 % (ref 0.0–0.2)

## 2019-05-20 LAB — COMPREHENSIVE METABOLIC PANEL
ALT: 12 U/L (ref 0–44)
AST: 18 U/L (ref 15–41)
Albumin: 3.5 g/dL (ref 3.5–5.0)
Alkaline Phosphatase: 79 U/L (ref 38–126)
Anion gap: 6 (ref 5–15)
BUN: 24 mg/dL — ABNORMAL HIGH (ref 8–23)
CO2: 27 mmol/L (ref 22–32)
Calcium: 9.5 mg/dL (ref 8.9–10.3)
Chloride: 104 mmol/L (ref 98–111)
Creatinine, Ser: 0.88 mg/dL (ref 0.61–1.24)
GFR calc Af Amer: >60 mL/min (ref >60)
GFR calc non Af Amer: >60 mL/min (ref >60)
Glucose, Bld: 162 mg/dL — ABNORMAL HIGH (ref 70–99)
Potassium: 4.5 mmol/L (ref 3.5–5.1)
Sodium: 137 mmol/L (ref 135–145)
Total Bilirubin: 0.5 mg/dL (ref 0.3–1.2)
Total Protein: 7.3 g/dL (ref 6.5–8.1)

## 2019-05-20 MED ORDER — HEPARIN SOD (PORK) LOCK FLUSH 100 UNIT/ML IV SOLN
500.0000 [IU] | Freq: Once | INTRAVENOUS | Status: AC | PRN
  Administered 2019-05-20: 500 [IU]
  Filled 2019-05-20: qty 5

## 2019-05-20 MED ORDER — SODIUM CHLORIDE 0.9% FLUSH
10.0000 mL | Freq: Once | INTRAVENOUS | Status: AC
  Administered 2019-05-20: 10 mL via INTRAVENOUS
  Filled 2019-05-20: qty 10

## 2019-05-20 MED ORDER — SODIUM CHLORIDE 0.9 % IV SOLN
Freq: Once | INTRAVENOUS | Status: AC
  Administered 2019-05-20: 10:00:00 via INTRAVENOUS
  Filled 2019-05-20: qty 250

## 2019-05-20 MED ORDER — SODIUM CHLORIDE 0.9 % IV SOLN
1.0000 mg/kg | Freq: Once | INTRAVENOUS | Status: AC
  Administered 2019-05-20: 70 mg via INTRAVENOUS
  Filled 2019-05-20: qty 6

## 2019-05-20 MED ORDER — PALONOSETRON HCL INJECTION 0.25 MG/5ML
0.2500 mg | Freq: Once | INTRAVENOUS | Status: AC
  Administered 2019-05-20: 0.25 mg via INTRAVENOUS
  Filled 2019-05-20: qty 5

## 2019-05-20 MED ORDER — DEXAMETHASONE SODIUM PHOSPHATE 10 MG/ML IJ SOLN
10.0000 mg | Freq: Once | INTRAMUSCULAR | Status: AC
  Administered 2019-05-20: 10 mg via INTRAVENOUS
  Filled 2019-05-20: qty 1

## 2019-05-20 MED ORDER — OLANZAPINE 10 MG PO TABS: 10.0000 mg | ORAL_TABLET | Freq: Every day | ORAL | 1 refills | Status: DC

## 2019-05-20 NOTE — Progress Notes (Signed)
[place]  [phone] [phone]   Name: Patrick Mcgee Date: 05/20/2019 MRN: [id number]  DOB: [birth year]  Patient Care Team: Jerrol Banana., MD as PCP - General (Family Medicine) Dingeldein, [name], MD as Consulting Physician (Ophthalmology) [name] as Consulting Physician (Internal Medicine) [name], MD (Inactive) as Consulting Physician (Pulmonary Disease) Sindy Guadeloupe, MD as Consulting Physician (Oncology)    REASON FOR CONSULTATION: Palliative Care consult requested for this 73 y.o. 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  PMH is also notable for mild interstitial lung disease, diabetes, and CAD status post previous stenting.  Patient was referred to palliative care to help address goals and manage ongoing symptoms.  SOCIAL HISTORY:     reports that he quit smoking about 40 years ago. His smoking use included cigars. He quit smokeless tobacco use about 30 years ago.  His smokeless tobacco use included chew. He reports previous alcohol use. He reports that he does not use drugs.   Patient is married and lives at home with his wife.  He has a daughter who lives nearby.  Patient formally worked at [employer] in [place]:  On file dated 12/28/2017  CODE STATUS:   PAST MEDICAL HISTORY: Past Medical History:  Diagnosis Date  . Acid reflux   . Anxiety   . Depression   . Diabetes mellitus without complication ([place])   . Hyperlipidemia   . Hypertension   . MRSA (methicillin resistant Staphylococcus aureus) infection 1992   history  . Myocardial infarction ([place]) 1995  . Sleep apnea    CPAP  . Urothelial cancer ([place]) 11/2017   Left Urothelial mass, chemo tx's    PAST SURGICAL HISTORY:  Past Surgical History:  Procedure Laterality Date  . CATARACT  EXTRACTION Left   . COLONOSCOPY  2010   [facility]  . COLONOSCOPY WITH PROPOFOL N/A 10/04/2016   Procedure: COLONOSCOPY WITH PROPOFOL;  Surgeon: [name], MD;  Location: [facility] ENDOSCOPY;  Service: Endoscopy;  Laterality: N/A;  . [place], 2000, 2001, 2014  . CYSTOSCOPY W/ RETROGRADES Bilateral 12/10/2017   Procedure: CYSTOSCOPY WITH RETROGRADE PYELOGRAM;  Surgeon: [name], MD;  Location: [facility] ORS;  Service: Urology;  Laterality: Bilateral;  . CYSTOSCOPY W/ RETROGRADES Left 12/09/2018   Procedure: CYSTOSCOPY WITH RETROGRADE PYELOGRAM;  Surgeon: [name], MD;  Location: [facility] ORS;  Service: Urology;  Laterality: Left;  . CYSTOSCOPY WITH BIOPSY Left 12/09/2018   Procedure: CYSTOSCOPY WITH URETERAL/RENAL PELVIC BIOPSY;  Surgeon: [name], MD;  Location: [facility] ORS;  Service: Urology;  Laterality: Left;  . CYSTOSCOPY WITH STENT PLACEMENT Left 12/10/2017   Procedure: CYSTOSCOPY WITH STENT PLACEMENT;  Surgeon: [name], MD;  Location: [facility] ORS;  Service: Urology;  Laterality: Left;  . CYSTOSCOPY WITH STENT PLACEMENT Left 12/09/2018   Procedure: CYSTOSCOPY WITH STENT PLACEMENT;  Surgeon: [name], MD;  Location: [facility] ORS;  Service: Urology;  Laterality: Left;  . CYSTOSCOPY WITH URETEROSCOPY Left 12/09/2018   Procedure: CYSTOSCOPY WITH URETEROSCOPY;  Surgeon: [name], MD;  Location: [facility] ORS;  Service: Urology;  Laterality: Left;  . EYE SURGERY Bilateral    cataract  . INGUINAL HERNIA REPAIR Right 08/09/2015   Procedure: HERNIA REPAIR INGUINAL ADULT;  Surgeon: [name], MD;  Location: [facility] ORS;  Service: General;  Laterality: Right;  . NASAL SINUS SURGERY    .  nuclear stress test    . PORTA CATH INSERTION N/A 12/19/2017   Procedure: PORTA CATH INSERTION;  Surgeon: [name], MD;  Location: [place] CV LAB;  Service: Cardiovascular;  Laterality: N/A;  . URETERAL BIOPSY Left 12/10/2017   Procedure: URETERAL & renal  PELVIS BIOPSY;  Surgeon: [name], MD;  Location: [facility] ORS;  Service: Urology;  Laterality: Left;  . URETEROSCOPY Left 12/10/2017   Procedure: URETEROSCOPY;  Surgeon: [name], MD;  Location: [facility] ORS;  Service: Urology;  Laterality: Left;    HEMATOLOGY/ONCOLOGY HISTORY:  Oncology History Overview Note  Patrick Mcgee is a 73 y.o. 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  He presented with abdominal pain and weight loss.  Abdomen and pelvic CT on 12/01/2017 revealed a suspicious enhancing, filling defect involving the lower pole collecting system of the left kidney with asymmetric hypoenhancement of the inferior pole cortex of left kidney noted. Findings were worrisome for urothelial neoplasm.  There was abdominal and pelvic adenopathy compatible with nodal metastasis.  There was a 2.6 cm right para celiac node, 1,6 cm left retroperitoneal node, 1.4 cm right retroperitoneal node, 2.1 cm right common iliac node and 1 cm left external iliac node.  There was aortic atherosclerosis, kidney cysts, and prostate gland enlargement.  Chest CT on 12/07/2017 revealed no evidence of metastatic disease.    He underwent left ureteroscopy with renal pelvis biopsy and left ureteral stent placement on 12/10/2017.  Findings included a large nodular, vasculartumor within the left lower pole collecting system completely obstructing the entire left lower pole moiety. Ureter, right kidney, and bladder was normal.  Pathology revealed small fragments of high-grade urothelial carcinoma with a small focus of invasion.  He has diabetes and coronary artery disease s/p MI in 1995.  He is s/p stent plaecment x 5-6. Colonoscopy in 2018 was negative.      Urothelial carcinoma of kidney, left ([place])  12/07/2017 Initial Diagnosis   Urothelial carcinoma of kidney, left ([place])   12/17/2017 - 05/27/2018 Chemotherapy   The patient had palonosetron (ALOXI)  injection 0.25 mg, 0.25 mg, Intravenous,  Once, 8 of 10 cycles Administration: 0.25 mg (12/21/2017), 0.25 mg (01/14/2018), 0.25 mg (02/04/2018), 0.25 mg (02/25/2018), 0.25 mg (03/18/2018), 0.25 mg (04/08/2018), 0.25 mg (04/29/2018), 0.25 mg (05/27/2018) CARBOplatin (PARAPLATIN) 390 mg in sodium chloride 0.9 % 250 mL chemo infusion, 370 mg (100 % of original dose 370.4 mg), Intravenous,  Once, 8 of 10 cycles Dose modification:   (original dose 370.4 mg, Cycle 1, Reason: Provider Judgment),   (original dose 370.4 mg, Cycle 2, Reason: Other (see comments), Comment: Continue same dose as last cycle) Administration: 390 mg (12/21/2017), 390 mg (01/14/2018), 370 mg (02/04/2018), 370 mg (02/25/2018), 370 mg (03/18/2018), 370 mg (04/08/2018), 370 mg (04/29/2018), 370 mg (05/27/2018) gemcitabine (GEMZAR) 1,600 mg in sodium chloride 0.9 % 250 mL chemo infusion, 1,482 mg (80 % of original dose 1,000 mg/m2), Intravenous,  Once, 8 of 10 cycles Dose modification: 800 mg/m2 (original dose 1,000 mg/m2, Cycle 1, Reason: Provider Judgment), 800 mg/m2 (original dose 1,000 mg/m2, Cycle 2, Reason: Other (see comments), Comment: same dose as last cycle), 850 mg/m2 (original dose 1,000 mg/m2, Cycle 3, Reason: Provider Judgment, Comment: prior dose tolerated), 700 mg/m2 (original dose 1,000 mg/m2, Cycle 7, Reason: Provider Judgment), 750 mg/m2 (original dose 1,000 mg/m2, Cycle 7, Reason: Provider Judgment) Administration: 1,600 mg (12/21/2017), 1,600 mg (12/31/2017), 1,600 mg (01/14/2018), 1,600 mg (01/21/2018), 1,600 mg (02/04/2018), 1,600 mg (02/11/2018),  1,600 mg (02/25/2018), 1,600 mg (03/18/2018), 1,600 mg (04/08/2018), 1,600 mg (04/15/2018), 1,600 mg (04/29/2018), 1,400 mg (05/06/2018), 1,400 mg (05/27/2018) ondansetron (ZOFRAN) 8 mg in sodium chloride 0.9 % 50 mL IVPB, , Intravenous,  Once, 5 of 7 cycles Administration:  (01/21/2018)  for chemotherapy treatment.    01/14/2019 -  Chemotherapy   The patient had palonosetron (ALOXI) injection 0.25 mg,  0.25 mg, Intravenous,  Once, 5 of 5 cycles Administration: 0.25 mg (01/14/2019), 0.25 mg (02/11/2019), 0.25 mg (01/21/2019), 0.25 mg (01/28/2019), 0.25 mg (02/25/2019), 0.25 mg (03/11/2019), 0.25 mg (03/18/2019), 0.25 mg (04/22/2019), 0.25 mg (03/25/2019), 0.25 mg (04/29/2019) enfortumab vedotin-ejfv (PADCEV) 70 mg in sodium chloride 0.9 % 50 mL (1.2281 mg/mL) chemo infusion, 1 mg/kg = 70 mg (100 % of original dose 1 mg/kg), Intravenous,  Once, 5 of 5 cycles Dose modification: 1 mg/kg (original dose 1 mg/kg, Cycle 1, Reason: Provider Judgment) Administration: 70 mg (01/14/2019), 70 mg (02/11/2019), 70 mg (01/21/2019), 70 mg (01/28/2019), 70 mg (02/25/2019), 70 mg (03/11/2019), 70 mg (03/18/2019), 70 mg (04/22/2019), 70 mg (03/25/2019), 70 mg (04/29/2019)  for chemotherapy treatment.    Urothelial cancer ([place])  12/18/2017 Initial Diagnosis   Urothelial cancer ([place])   06/17/2018 - 01/14/2019 Chemotherapy   The patient had atezolizumab (TECENTRIQ) 1,200 mg in sodium chloride 0.9 % 250 mL chemo infusion, 1,200 mg, Intravenous, Once, 10 of 10 cycles Administration: 1,200 mg (06/17/2018), 1,200 mg (07/05/2018), 1,200 mg (07/29/2018), 1,200 mg (08/19/2018), 1,200 mg (09/30/2018), 1,200 mg (09/09/2018), 1,200 mg (10/21/2018), 1,200 mg (11/11/2018), 1,200 mg (12/03/2018), 1,200 mg (12/24/2018)  for chemotherapy treatment.      ALLERGIES:  is allergic to sulfa antibiotics; ace inhibitors; invokana [canagliflozin]; and penicillins.  MEDICATIONS:  Current Outpatient Medications  Medication Sig Dispense Refill  . aspirin EC 81 MG tablet Take 81 mg by mouth every evening.     Marland Kitchen atorvastatin (LIPITOR) 40 MG tablet TAKE 1 TABLET BY MOUTH AT BEDTIME 90 tablet 4  . Cholecalciferol (VITAMIN D3) 75 MCG (3000 UT) TABS Take by mouth.    . clobetasol cream (TEMOVATE) 1.61 % Apply 1 application topically as needed (for skin rash). 30 g 1  . empagliflozin (JARDIANCE) 10 MG TABS tablet Take 10 mg by mouth daily. (Patient taking differently: Take 10 mg  by mouth daily as needed (elevated blood sugar). ) 90 tablet 3  . fluticasone (FLONASE) 50 MCG/ACT nasal spray Use 2 spray(s) in each nostril once daily (Patient taking differently: Place 2 sprays into both nostrils daily as needed for allergies. ) 48 g 3  . gabapentin (NEURONTIN) 300 MG capsule TAKE 1 CAPSULE BY MOUTH THREE TIMES DAILY (START WITH 1 CAPSULE NIGHTLY THEN SLOWLY INCREASE TO TWICE DAILY THEN 3 TIMES DAILY OVER 7 DAYS) 90 capsule 0  . glucose blood (CONTOUR NEXT TEST) test strip Check blood sugars 3 times daily. 300 each 11  . hydrOXYzine (ATARAX/VISTARIL) 25 MG tablet Take 1 tablet (25 mg total) by mouth every 8 (eight) hours as needed for itching. 30 tablet 3  . Ivermectin (SOOLANTRA) 1 % CREA Apply 1 application topically daily as needed (rosacea). To face    . lidocaine-prilocaine (EMLA) cream Apply 1 application topically as needed (port access). 1 g 3  . loratadine (CLARITIN) 10 MG tablet Take 10 mg by mouth daily as needed for allergies. [employer] brand allergy relief    . losartan (COZAAR) 100 MG tablet TAKE 1 TABLET BY MOUTH ONCE DAILY 90 tablet 3  . magic mouthwash w/lidocaine SOLN Take 5 mLs by  mouth 4 (four) times daily as needed for mouth pain. 480 mL 3  . magnesium hydroxide (MILK OF MAGNESIA) 400 MG/5ML suspension Take 15 mLs by mouth daily as needed for mild constipation.    . metFORMIN (GLUCOPHAGE) 1000 MG tablet TAKE 1 TABLET BY MOUTH TWICE DAILY WITH MEALS (Patient taking differently: Take 1,000 mg by mouth 2 (two) times a day. ) 180 tablet 3  . nystatin cream (MYCOSTATIN) Apply topically 2 (two) times daily. (Patient taking differently: Apply 1 application topically 2 (two) times daily as needed (rash). ) 15 g 1  . OLANZapine (ZYPREXA) 10 MG tablet Take 1 tablet (10 mg total) by mouth at bedtime. 30 tablet 1  . OVER THE COUNTER MEDICATION Apply 1 application topically daily as needed (pain). Outback topical pain relief    . sertraline (ZOLOFT) 100 MG tablet Take 1  tablet (100 mg total) by mouth daily. 90 tablet 3  . vitamin B-12 (CYANOCOBALAMIN) 1000 MCG tablet Take by mouth daily. Unsure dose    . vitamin C (ASCORBIC ACID) 500 MG tablet Take 1,000-1,500 mg by mouth daily as needed (immune support).     No current facility-administered medications for this visit.    Facility-Administered Medications Ordered in Other Visits  Medication Dose Route Frequency Provider Last Rate Last Dose  . Influenza vac split quadrivalent PF (FLUZONE HIGH-DOSE) injection 0.5 mL  0.5 mL Intramuscular Once Karen Kitchens, NP        VITAL SIGNS: There were no vitals taken for this visit. There were no vitals filed for this visit.  Estimated body mass index is 22.62 kg/m as calculated from the following:   Height as of 04/22/19: '5\' 8"'$  (1.727 m).   Weight as of an earlier encounter on 05/20/19: 148 lb 12.8 oz (67.5 kg).  LABS: CBC:    Component Value Date/Time   WBC 5.7 05/20/2019 0923   HGB 10.7 (L) 05/20/2019 0923   HGB 13.7 03/15/2017 1042   HCT 33.0 (L) 05/20/2019 0923   HCT 39.7 03/15/2017 1042   PLT 316 05/20/2019 0923   PLT 270 03/15/2017 1042   MCV 86.6 05/20/2019 0923   MCV 89 03/15/2017 1042   MCV 89 02/03/2013 2223   NEUTROABS 3.6 05/20/2019 0923   NEUTROABS 4.6 03/15/2017 1042   LYMPHSABS 1.4 05/20/2019 0923   LYMPHSABS 1.2 03/15/2017 1042   MONOABS 0.5 05/20/2019 0923   EOSABS 0.1 05/20/2019 0923   EOSABS 0.3 03/15/2017 1042   BASOSABS 0.1 05/20/2019 0923   BASOSABS 0.0 03/15/2017 1042   Comprehensive Metabolic Panel:    Component Value Date/Time   NA 137 05/20/2019 0923   NA 137 03/15/2017 1042   NA 137 02/03/2013 2223   K 4.5 05/20/2019 0923   K 4.0 02/03/2013 2223   CL 104 05/20/2019 0923   CL 106 02/03/2013 2223   CO2 27 05/20/2019 0923   CO2 28 02/03/2013 2223   BUN 24 (H) 05/20/2019 0923   BUN 9 03/15/2017 1042   BUN 11 02/03/2013 2223   CREATININE 0.88 05/20/2019 0923   CREATININE 0.69 02/03/2013 2223   GLUCOSE 162 (H)  05/20/2019 0923   GLUCOSE 158 (H) 02/03/2013 2223   CALCIUM 9.5 05/20/2019 0923   CALCIUM 9.3 02/03/2013 2223   AST 18 05/20/2019 0923   AST 26 02/03/2013 2223   ALT 12 05/20/2019 0923   ALT 30 02/03/2013 2223   ALKPHOS 79 05/20/2019 0923   ALKPHOS 121 02/03/2013 2223   BILITOT 0.5 05/20/2019 7579  BILITOT 1.5 (H) 03/15/2017 1042   BILITOT 1.1 (H) 02/03/2013 2223   PROT 7.3 05/20/2019 0923   PROT 6.9 03/15/2017 1042   PROT 7.5 02/03/2013 2223   ALBUMIN 3.5 05/20/2019 0923   ALBUMIN 4.3 03/15/2017 1042   ALBUMIN 3.3 (L) 02/03/2013 2223    RADIOGRAPHIC STUDIES: No results found.  PERFORMANCE STATUS (ECOG) : 2 - Symptomatic, <50% confined to bed  Review of Systems Unless otherwise noted, a complete review of systems is negative.  Physical Exam General: NAD, frail appearing, thin Pulmonary: Unlabored Extremities: no edema, no joint deformities Skin: no rashes Neurological: Weakness but otherwise nonfocal  IMPRESSION: Routine follow-up visit with patient.  He was seen in the infusion area.  Patient reports that he is doing reasonably well.  He still continues to have weakness and has been receiving home health PT, which he feels has been extremely helpful.  His weakness is also exacerbated by severe peripheral neuropathy for which he has been receiving acupuncture with recent improvement in symptoms.  His appetite is poor.  His weight is down to 148 pounds today from previous weight of 152 pounds 3 weeks ago.  We will send a new referral to our dietitian.  Patient is consuming oral supplements once a day.  I encouraged him to increase to 2 or 3 times a day.  Patient says that Dr. Janese Banks is considering initiation of an appetite stimulant.  Patient's appetite is impaired by his sleep pattern as well as his concern for glycemic control.  Patient says that he has previously met with an attorney and has ACP documents established at home.  We did discuss CODE STATUS previously.  Will  complete MOST form during a future visit when he can again be seen privately in an exam room.   PLAN: -Continue current scope of treatment -Referral to RD -[place] with trial of an appetite stimulant -Recommend increasing oral supplements to BID-TID -MOST form previously reviewed -RTC in 1 month   Patient expressed understanding and was in agreement with this plan. He also understands that He can call the clinic at any time with any questions, concerns, or complaints.     Time Total: 20 minutes  Visit consisted of counseling and education dealing with the complex and emotionally intense issues of symptom management and palliative care in the setting of serious and potentially life-threatening illness.Greater than 50%  of this time was spent counseling and coordinating care related to the above assessment and plan.  Signed by: Altha Harm, PhD, NP-C [phone] (Work Cell)

## 2019-05-21 ENCOUNTER — Other Ambulatory Visit: Payer: Self-pay | Admitting: *Deleted

## 2019-05-21 ENCOUNTER — Other Ambulatory Visit: Payer: Self-pay | Admitting: Family Medicine

## 2019-05-21 DIAGNOSIS — D6481 Anemia due to antineoplastic chemotherapy: Secondary | ICD-10-CM | POA: Diagnosis not present

## 2019-05-21 DIAGNOSIS — R530 Neoplastic (malignant) related fatigue: Secondary | ICD-10-CM | POA: Diagnosis not present

## 2019-05-21 DIAGNOSIS — T451X5D Adverse effect of antineoplastic and immunosuppressive drugs, subsequent encounter: Secondary | ICD-10-CM | POA: Diagnosis not present

## 2019-05-21 DIAGNOSIS — D642 Secondary sideroblastic anemia due to drugs and toxins: Secondary | ICD-10-CM | POA: Diagnosis not present

## 2019-05-21 DIAGNOSIS — G620 Drug-induced polyneuropathy: Secondary | ICD-10-CM | POA: Diagnosis not present

## 2019-05-21 DIAGNOSIS — C772 Secondary and unspecified malignant neoplasm of intra-abdominal lymph nodes: Secondary | ICD-10-CM | POA: Diagnosis not present

## 2019-05-21 MED ORDER — GABAPENTIN 300 MG PO CAPS: 300.0000 mg | ORAL_CAPSULE | Freq: Three times a day (TID) | ORAL | 0 refills | Status: DC

## 2019-05-21 NOTE — Telephone Encounter (Signed)
Pharmacy requesting refills. Thanks!  

## 2019-05-22 ENCOUNTER — Telehealth: Payer: Self-pay

## 2019-05-22 ENCOUNTER — Telehealth: Payer: Self-pay | Admitting: Family Medicine

## 2019-05-22 DIAGNOSIS — G893 Neoplasm related pain (acute) (chronic): Secondary | ICD-10-CM

## 2019-05-22 DIAGNOSIS — C642 Malignant neoplasm of left kidney, except renal pelvis: Secondary | ICD-10-CM

## 2019-05-22 NOTE — Telephone Encounter (Signed)
Please advised  

## 2019-05-22 NOTE — Telephone Encounter (Signed)
Patrick Mcgee, PT w/ [place] [facility]  Pt has been discharged from Home PT. Requesting a referral for Out Patient PT [facility] hospital.  Thanks, [facility]

## 2019-05-22 NOTE — Progress Notes (Signed)
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 [name] so far for his metastatic urothelial carcinoma. This was originally diagnosed in May 2019. He was noted to have a filling defect in the lower pole collecting system of the left kidney along with para-aortic and retroperitoneal adenopathy concerning for metastatic disease. He underwent left ureteroscopy and renal pelvis biopsy which revealed small fragments of high-grade urothelial carcinoma with small focus of invasion. He was started on carboplatin and gemcitabine chemotherapy in June 2019 and was continued on 05/27/2018 for 8 cycles. He tolerated chemotherapy well except for chemo-induced anemia for which she has been getting Procrit every 2 weeks. He did have response to his disease based on scans in August 2019. However repeat scan on 05/31/2018 showed increase in the size of the primary tumor from 1.2 to 1.9 cm and increase in left para-aortic adenopathy from 0.9 to 1.3 cm. Periportal adenopathy was stable at 1.4 cm. Second line immunotherapy was recommended. His initial biopsy specimen did not have enough sample to undergo FGFR mutation testing. He also has mild interstitial lung  disease for which he sees pulmonary but he is not on home oxygen. He has B12 deficiency for which he is on oral B12. Also has diabetes and coronary artery disease.Tecentriq started on 06/17/2018.Patient noted to have disease progression in his lymph nodes in May 2020. Repeat biopsy showed metastatic urothelial carcinoma which did not have aFGFR mutation. He has been started on third line Padcev  Interval history-he feels that he is tolerating 2 weeks on 2 weeks off regimen with.  There is still times when he feels fatigued but he is having more good days.  Neuropathy remains stable and he continues to work with physical therapy on his foot drop.  He does report poor appetite and would like to try something to improve his appetite  ECOG PS- 1 Pain scale- 0 Opioid associated constipation- no  Review of systems- Review of Systems  Constitutional: Positive for malaise/fatigue. Negative for chills, fever and weight loss.       Lack of appetite  HENT: Negative for congestion, ear discharge and nosebleeds.   Eyes: Negative for blurred vision.  Respiratory: Negative for cough, hemoptysis, sputum production, shortness of breath and wheezing.   Cardiovascular: Negative for chest pain, palpitations, orthopnea and claudication.  Gastrointestinal: Negative for abdominal pain, blood in stool, constipation, diarrhea, heartburn, melena, nausea and vomiting.  Genitourinary: Negative for dysuria, flank pain, frequency, hematuria and urgency.  Musculoskeletal: Negative for back pain, joint pain and myalgias.  Skin: Negative for rash.  Neurological: Negative for dizziness, tingling, focal weakness, seizures, weakness and headaches.  Endo/Heme/Allergies: Does not bruise/bleed easily.  Psychiatric/Behavioral: Negative for depression and suicidal ideas. The patient does not have insomnia.       Allergies  Allergen Reactions   Sulfa Antibiotics Other (See Comments)  Joint pain   Ace Inhibitors Cough    Invokana [Canagliflozin] Other (See Comments)    Leg pain   Penicillins Rash    Did it involve swelling of the face/tongue/throat, SOB, or low BP? No Did it involve sudden or severe rash/hives, skin peeling, or any reaction on the inside of your mouth or nose? Yes Did you need to seek medical attention at a hospital or doctor's office? Yes When did it last happen?childhood allergy If all above answers are "NO", may proceed with cephalosporin use.      Past Medical History:  Diagnosis Date   Acid reflux    Anxiety    Depression    Diabetes mellitus without complication ([place])    Hyperlipidemia    Hypertension    MRSA (methicillin resistant Staphylococcus aureus) infection 1992   history   Myocardial infarction ([place]) 1995   Sleep apnea    CPAP   Urothelial cancer ([place]) 11/2017   Left Urothelial mass, chemo tx's     Past Surgical History:  Procedure Laterality Date   CATARACT EXTRACTION Left    COLONOSCOPY  2010   [facility]   COLONOSCOPY WITH PROPOFOL N/A 10/04/2016   Procedure: COLONOSCOPY WITH PROPOFOL;  Surgeon: [name], MD;  Location: [place];  Service: Endoscopy;  Laterality: N/A;   [employer], 2000, 2001, 2014   CYSTOSCOPY W/ RETROGRADES Bilateral 12/10/2017   Procedure: CYSTOSCOPY WITH RETROGRADE PYELOGRAM;  Surgeon: [name], MD;  Location: [facility];  Service: Urology;  Laterality: Bilateral;   CYSTOSCOPY W/ RETROGRADES Left 12/09/2018   Procedure: CYSTOSCOPY WITH RETROGRADE PYELOGRAM;  Surgeon: [name], MD;  Location: [facility];  Service: Urology;  Laterality: Left;   CYSTOSCOPY WITH BIOPSY Left 12/09/2018   Procedure: CYSTOSCOPY WITH URETERAL/RENAL PELVIC BIOPSY;  Surgeon: [name], MD;  Location: [facility];  Service: Urology;  Laterality: Left;   CYSTOSCOPY WITH STENT PLACEMENT Left 12/10/2017   Procedure: CYSTOSCOPY WITH STENT PLACEMENT;  Surgeon: [name], MD;   Location: [facility];  Service: Urology;  Laterality: Left;   CYSTOSCOPY WITH STENT PLACEMENT Left 12/09/2018   Procedure: CYSTOSCOPY WITH STENT PLACEMENT;  Surgeon: [name], MD;  Location: [facility];  Service: Urology;  Laterality: Left;   CYSTOSCOPY WITH URETEROSCOPY Left 12/09/2018   Procedure: CYSTOSCOPY WITH URETEROSCOPY;  Surgeon: [name], MD;  Location: [facility];  Service: Urology;  Laterality: Left;   EYE SURGERY Bilateral    cataract   INGUINAL HERNIA REPAIR Right 08/09/2015   Procedure: HERNIA REPAIR INGUINAL ADULT;  Surgeon: [name], MD;  Location: [facility];  Service: General;  Laterality: Right;   NASAL SINUS SURGERY     nuclear stress test     PORTA CATH INSERTION N/A 12/19/2017   Procedure: PORTA CATH INSERTION;  Surgeon: [name], MD;  Location: [place] [facility];  Service: Cardiovascular;  Laterality: N/A;   URETERAL BIOPSY Left 12/10/2017   Procedure: URETERAL & renal PELVIS BIOPSY;  Surgeon: [name], MD;  Location: [facility];  Service: Urology;  Laterality: Left;   URETEROSCOPY Left 12/10/2017   Procedure: URETEROSCOPY;  Surgeon: [name], MD;  Location: [facility];  Service: Urology;  Laterality: Left;    Social History   Socioeconomic History   Marital status: Married    Spouse name: [name]   Number of children: 1   Years of education: Not on file   Highest education level: Associate degree: occupational, Hotel manager, or vocational program  Occupational History  Occupation: retired  Scientist, product/process development strain: Not hard at [employer] insecurity    Worry: Never true    Inability: Never true   Transportation needs    Medical: No    Non-medical: No  Tobacco Use   Smoking status: Former Smoker    Types: Cigars    Quit date: 10/09/1978    Years since quitting: 40.6   Smokeless tobacco: Former Systems developer    Types: Chew    Quit date: 12/04/1988   Tobacco comment: on occasion  Substance and Sexual Activity    Alcohol use: Not Currently   Drug use: No   Sexual activity: Not Currently  Lifestyle   Physical activity    Days per week: 0 days    Minutes per session: 0 min   Stress: Not at all  Relationships   Social connections    Talks on phone: Patient refused    Gets together: Patient refused    Attends religious service: Patient refused    Active member of club or organization: Patient refused    Attends meetings of clubs or organizations: Patient refused    Relationship status: Patient refused   Intimate partner violence    Fear of current or ex partner: Patient refused    Emotionally abused: Patient refused    Physically abused: Patient refused    Forced sexual activity: Patient refused  Other Topics Concern   Not on file  Social History Narrative   Not on file    Family History  Problem Relation Age of Onset   Heart disease Mother    Cancer Father        Lung and colon cancer   Heart disease Father    Emphysema Maternal Grandfather    Tuberculosis Maternal Grandmother      Current Outpatient Medications:    aspirin EC 81 MG tablet, Take 81 mg by mouth every evening. , Disp: , Rfl:    atorvastatin (LIPITOR) 40 MG tablet, TAKE 1 TABLET BY MOUTH AT BEDTIME, Disp: 90 tablet, Rfl: 4   Cholecalciferol (VITAMIN D3) 75 MCG (3000 UT) TABS, Take by mouth., Disp: , Rfl:    clobetasol cream (TEMOVATE) [id number] %, Apply 1 application topically as needed (for skin rash)., Disp: 30 g, Rfl: 1   empagliflozin (JARDIANCE) 10 MG TABS tablet, Take 10 mg by mouth daily. (Patient taking differently: Take 10 mg by mouth daily as needed (elevated blood sugar). ), Disp: 90 tablet, Rfl: 3   fluticasone (FLONASE) 50 MCG/ACT nasal spray, Use 2 spray(s) in each nostril once daily (Patient taking differently: Place 2 sprays into both nostrils daily as needed for allergies. ), Disp: 48 g, Rfl: 3   glucose blood (CONTOUR NEXT TEST) test strip, Check blood sugars 3 times daily., Disp:  300 each, Rfl: 11   hydrOXYzine (ATARAX/VISTARIL) 25 MG tablet, Take 1 tablet (25 mg total) by mouth every 8 (eight) hours as needed for itching., Disp: 30 tablet, Rfl: 3   Ivermectin (SOOLANTRA) 1 % CREA, Apply 1 application topically daily as needed (rosacea). To face, Disp: , Rfl:    lidocaine-prilocaine (EMLA) cream, Apply 1 application topically as needed (port access)., Disp: 1 g, Rfl: 3   loratadine (CLARITIN) 10 MG tablet, Take 10 mg by mouth daily as needed for allergies. [employer] brand allergy relief, Disp: , Rfl:    magic mouthwash w/lidocaine SOLN, Take 5 mLs by mouth 4 (four) times daily as needed for mouth pain., Disp: 480 mL, Rfl:  3   magnesium hydroxide (MILK OF MAGNESIA) 400 MG/5ML suspension, Take 15 mLs by mouth daily as needed for mild constipation., Disp: , Rfl:    metFORMIN (GLUCOPHAGE) 1000 MG tablet, TAKE 1 TABLET BY MOUTH TWICE DAILY WITH MEALS (Patient taking differently: Take 1,000 mg by mouth 2 (two) times a day. ), Disp: 180 tablet, Rfl: 3   nystatin cream (MYCOSTATIN), Apply topically 2 (two) times daily. (Patient taking differently: Apply 1 application topically 2 (two) times daily as needed (rash). ), Disp: 15 g, Rfl: 1   OVER THE COUNTER MEDICATION, Apply 1 application topically daily as needed (pain). Outback topical pain relief, Disp: , Rfl:    sertraline (ZOLOFT) 100 MG tablet, Take 1 tablet (100 mg total) by mouth daily., Disp: 90 tablet, Rfl: 3   vitamin B-12 (CYANOCOBALAMIN) 1000 MCG tablet, Take by mouth daily. Unsure dose, Disp: , Rfl:    vitamin C (ASCORBIC ACID) 500 MG tablet, Take 1,000-1,500 mg by mouth daily as needed (immune support)., Disp: , Rfl:    gabapentin (NEURONTIN) 300 MG capsule, Take 1 capsule (300 mg total) by mouth 3 (three) times daily., Disp: 90 capsule, Rfl: 0   losartan (COZAAR) 100 MG tablet, Take 1 tablet by mouth once daily, Disp: 90 tablet, Rfl: 3   OLANZapine (ZYPREXA) 10 MG tablet, Take 1 tablet (10 mg total) by mouth  at bedtime., Disp: 30 tablet, Rfl: 1 No current facility-administered medications for this visit.   Facility-Administered Medications Ordered in Other Visits:    Influenza vac split quadrivalent PF (FLUZONE HIGH-DOSE) injection 0.5 mL, 0.5 mL, Intramuscular, Once, Karen Kitchens, NP  Physical exam:  Vitals:   05/20/19 1004  BP: 115/84  Pulse: 77  Resp: 16  Temp: (!) 96.1 F (35.6 C)  TempSrc: Temporal  SpO2: 98%  Weight: 148 lb 12.8 oz (67.5 kg)   Physical Exam Constitutional:      General: He is not in acute distress.    Comments: Elderly gentleman who appears mildly fatigued  HENT:     Head: Normocephalic and atraumatic.  Eyes:     Pupils: Pupils are equal, round, and reactive to light.  Neck:     Musculoskeletal: Normal range of motion.  Cardiovascular:     Rate and Rhythm: Normal rate and regular rhythm.     Heart sounds: Normal heart sounds.  Pulmonary:     Effort: Pulmonary effort is normal.     Breath sounds: Normal breath sounds.  Abdominal:     General: Bowel sounds are normal.     Palpations: Abdomen is soft.  Skin:    General: Skin is warm and dry.  Neurological:     Mental Status: He is alert and oriented to person, place, and time.     Comments: Right foot drop      CMP Latest Ref Rng & Units 05/20/2019  Glucose 70 - 99 mg/dL 162(H)  BUN 8 - 23 mg/dL 24(H)  Creatinine 0.61 - 1.24 mg/dL 0.88  Sodium 135 - 145 mmol/L 137  Potassium 3.5 - 5.1 mmol/L 4.5  Chloride 98 - 111 mmol/L 104  CO2 22 - 32 mmol/L 27  Calcium 8.9 - 10.3 mg/dL 9.5  Total Protein 6.5 - 8.1 g/dL 7.3  Total Bilirubin 0.3 - 1.2 mg/dL 0.5  Alkaline Phos 38 - 126 U/L 79  AST 15 - 41 U/L 18  ALT 0 - 44 U/L 12   CBC Latest Ref Rng & Units 05/20/2019  WBC 4.0 - 10.5 K/uL  5.7  Hemoglobin 13.0 - 17.0 g/dL 10.7(L)  Hematocrit 39.0 - 52.0 % 33.0(L)  Platelets 150 - 400 K/uL 316      Assessment and plan- Patient is a 73 y.o. male with history of metastatic urothelial carcinoma with  metastases to the lymph nodes.He had disease progression on carboplatin/gemcitabine as well as second line Tecentriq.He is here for on treatment assessment prior to cycle 6-day 1 of Padcev  Plans okay to proceed with cycle 6-day 1 of Padcev today.  He will directly proceed for cycle 6-day 8 next week and I will see him back in 4 weeks for cycle 7-day 1.  Padcev dose has been reduced to 1 mg per metered squared given his ongoing neuropathy.  Patient is using artificial tears to prevent dry eyes.  Chemo-induced peripheral neuropathy: He is currently on gabapentin and getting acupuncture.  He is also working with physical therapy and his right foot drop.  Continue to monitor  I will prescribe olanzapine 10 mg at night for appetite stimulation   Visit Diagnosis 1. Encounter for antineoplastic chemotherapy   2. Chemotherapy-induced peripheral neuropathy ([place])   3. Urothelial carcinoma of kidney, left ([facility])      Dr. Randa Evens, MD, MPH [facility] [id number] 05/22/2019 10:03 AM

## 2019-05-22 NOTE — Telephone Encounter (Signed)
ok 

## 2019-05-22 NOTE — Telephone Encounter (Signed)
Nutrition:  Referral from [place], NP weight loss and poor appetite.   RD last spoke with patient on 01/2018.    Called and spoke with wife. Patient still sleeping this am.  Wife reports that she tries to fix him good healthy foods but sometimes he just does not want it.    Wife wanting RD to speak with patient.  RD not in clinic on days when patient comes to cancer center.  Provided contact phone number to wife and patient to call RD.    Catriona Dillenbeck B. Zenia Resides, [place], [place] Registered Dietitian [phone] (pager)

## 2019-05-22 NOTE — Telephone Encounter (Signed)
Nutrition Follow-up:  Patient with metastatic urothelial cancer currently on chemotherapy.  Followed by Dr. Janese Banks.    Patient returned RD's call.  Patient reports that his appetite has decreased and he eats about 1-2 meals a day.  Reports that he sleeps a lot especially following chemotherapy.  Has been receiving home PT for weakness and acupuncture for severe neuropathy.  Reports that he is drinking premier protein mixed with fruit about 1 time per day.    Patient reports that when this treatment stops working he will no longer take treatment.  Palliative care if following.   Medications: zyprexa  Labs: glucose 162,  Anthropometrics:   Weight 148 lb on 11/10 decreased from 152 lb on 10/20  162 lb in 01/2018  NUTRITION DIAGNOSIS: Inadequate oral intake continues with weight loss   INTERVENTION:  Discussed strategies to add calories and protein to current eating pattern. Encouraged patient to increase frequency of shake Agree with trial of appetite stimulant Patient has contact information    MONITORING, EVALUATION, GOAL: Patient will work to increase calories and protein to prevent weight from decreasing   NEXT VISIT: phone f/u Dec 10th  Katelin Kutsch B. Zenia Resides, [place], [place] Registered Dietitian [phone] (pager)

## 2019-05-23 NOTE — Telephone Encounter (Signed)
Referral has been placed. 

## 2019-05-28 ENCOUNTER — Inpatient Hospital Stay: Payer: Medicare Other

## 2019-05-28 ENCOUNTER — Inpatient Hospital Stay (HOSPITAL_BASED_OUTPATIENT_CLINIC_OR_DEPARTMENT_OTHER): Payer: Medicare Other | Admitting: Oncology

## 2019-05-28 ENCOUNTER — Telehealth: Payer: Self-pay

## 2019-05-28 ENCOUNTER — Other Ambulatory Visit: Payer: Self-pay

## 2019-05-28 VITALS — BP 121/76 | HR 75 | Temp 98.2°F | Resp 18 | Wt 150.0 lb

## 2019-05-28 DIAGNOSIS — C642 Malignant neoplasm of left kidney, except renal pelvis: Secondary | ICD-10-CM | POA: Diagnosis not present

## 2019-05-28 DIAGNOSIS — T451X5A Adverse effect of antineoplastic and immunosuppressive drugs, initial encounter: Secondary | ICD-10-CM

## 2019-05-28 DIAGNOSIS — C652 Malignant neoplasm of left renal pelvis: Secondary | ICD-10-CM | POA: Diagnosis not present

## 2019-05-28 DIAGNOSIS — D701 Agranulocytosis secondary to cancer chemotherapy: Secondary | ICD-10-CM

## 2019-05-28 DIAGNOSIS — Z5112 Encounter for antineoplastic immunotherapy: Secondary | ICD-10-CM | POA: Diagnosis not present

## 2019-05-28 DIAGNOSIS — I25119 Atherosclerotic heart disease of native coronary artery with unspecified angina pectoris: Secondary | ICD-10-CM | POA: Diagnosis not present

## 2019-05-28 DIAGNOSIS — Z95828 Presence of other vascular implants and grafts: Secondary | ICD-10-CM | POA: Diagnosis not present

## 2019-05-28 DIAGNOSIS — C772 Secondary and unspecified malignant neoplasm of intra-abdominal lymph nodes: Secondary | ICD-10-CM | POA: Diagnosis not present

## 2019-05-28 LAB — CBC WITH DIFFERENTIAL/PLATELET
Abs Immature Granulocytes: 0.02 10*3/uL (ref 0.00–0.07)
Basophils Absolute: 0.1 10*3/uL (ref 0.0–0.1)
Basophils Relative: 1 %
Eosinophils Absolute: 0.2 10*3/uL (ref 0.0–0.5)
Eosinophils Relative: 2 %
HCT: 35.3 % — ABNORMAL LOW (ref 39.0–52.0)
Hemoglobin: 11.3 g/dL — ABNORMAL LOW (ref 13.0–17.0)
Immature Granulocytes: 0 %
Lymphocytes Relative: 18 %
Lymphs Abs: 1.3 10*3/uL (ref 0.7–4.0)
MCH: 27.8 pg (ref 26.0–34.0)
MCHC: 32 g/dL (ref 30.0–36.0)
MCV: 86.9 fL (ref 80.0–100.0)
Monocytes Absolute: 0.7 10*3/uL (ref 0.1–1.0)
Monocytes Relative: 9 %
Neutro Abs: 5.4 10*3/uL (ref 1.7–7.7)
Neutrophils Relative %: 70 %
Platelets: 261 10*3/uL (ref 150–400)
RBC: 4.06 MIL/uL — ABNORMAL LOW (ref 4.22–5.81)
RDW: 16.3 % — ABNORMAL HIGH (ref 11.5–15.5)
WBC: 7.6 10*3/uL (ref 4.0–10.5)
nRBC: 0 % (ref 0.0–0.2)

## 2019-05-28 LAB — COMPREHENSIVE METABOLIC PANEL
ALT: 19 U/L (ref 0–44)
AST: 24 U/L (ref 15–41)
Albumin: 3.8 g/dL (ref 3.5–5.0)
Alkaline Phosphatase: 76 U/L (ref 38–126)
Anion gap: 6 (ref 5–15)
BUN: 17 mg/dL (ref 8–23)
CO2: 27 mmol/L (ref 22–32)
Calcium: 9.4 mg/dL (ref 8.9–10.3)
Chloride: 99 mmol/L (ref 98–111)
Creatinine, Ser: 0.65 mg/dL (ref 0.61–1.24)
GFR calc Af Amer: >60 mL/min (ref >60)
GFR calc non Af Amer: >60 mL/min (ref >60)
Glucose, Bld: 179 mg/dL — ABNORMAL HIGH (ref 70–99)
Potassium: 4.1 mmol/L (ref 3.5–5.1)
Sodium: 132 mmol/L — ABNORMAL LOW (ref 135–145)
Total Bilirubin: 0.9 mg/dL (ref 0.3–1.2)
Total Protein: 7 g/dL (ref 6.5–8.1)

## 2019-05-28 MED ORDER — SODIUM CHLORIDE 0.9% FLUSH
10.0000 mL | Freq: Once | INTRAVENOUS | Status: AC
  Administered 2019-05-28: 10 mL via INTRAVENOUS
  Filled 2019-05-28: qty 10

## 2019-05-28 MED ORDER — SODIUM CHLORIDE 0.9 % IV SOLN
1.0000 mg/kg | Freq: Once | INTRAVENOUS | Status: AC
  Administered 2019-05-28: 70 mg via INTRAVENOUS
  Filled 2019-05-28: qty 6

## 2019-05-28 MED ORDER — SODIUM CHLORIDE 0.9 % IV SOLN
Freq: Once | INTRAVENOUS | Status: AC
  Administered 2019-05-28: 14:00:00 via INTRAVENOUS
  Filled 2019-05-28: qty 250

## 2019-05-28 MED ORDER — DEXAMETHASONE SODIUM PHOSPHATE 10 MG/ML IJ SOLN
10.0000 mg | Freq: Once | INTRAMUSCULAR | Status: AC
  Administered 2019-05-28: 10 mg via INTRAVENOUS
  Filled 2019-05-28: qty 1

## 2019-05-28 MED ORDER — PALONOSETRON HCL INJECTION 0.25 MG/5ML
0.2500 mg | Freq: Once | INTRAVENOUS | Status: AC
  Administered 2019-05-28: 0.25 mg via INTRAVENOUS
  Filled 2019-05-28: qty 5

## 2019-05-28 MED ORDER — HEPARIN SOD (PORK) LOCK FLUSH 100 UNIT/ML IV SOLN
500.0000 [IU] | Freq: Once | INTRAVENOUS | Status: AC | PRN
  Administered 2019-05-28: 15:00:00 500 [IU]
  Filled 2019-05-28: qty 5

## 2019-05-28 NOTE — Telephone Encounter (Signed)
Telephone call to patient to schedule palliative care visit with patient. Patient/family in agreement with home visit on 06-09-19 at 1:00 PM.

## 2019-06-02 ENCOUNTER — Ambulatory Visit: Payer: Medicare Other | Attending: Family Medicine

## 2019-06-02 ENCOUNTER — Other Ambulatory Visit: Payer: Self-pay | Admitting: Family Medicine

## 2019-06-02 ENCOUNTER — Other Ambulatory Visit: Payer: Self-pay

## 2019-06-02 DIAGNOSIS — M6281 Muscle weakness (generalized): Secondary | ICD-10-CM | POA: Insufficient documentation

## 2019-06-02 DIAGNOSIS — R2681 Unsteadiness on feet: Secondary | ICD-10-CM | POA: Diagnosis not present

## 2019-06-02 DIAGNOSIS — E119 Type 2 diabetes mellitus without complications: Secondary | ICD-10-CM

## 2019-06-02 NOTE — Therapy (Signed)
[place] MAIN [facility] SERVICES [address] [place], [place], [address] Phone: [phone]   Fax:  [phone]  Physical Therapy Evaluation  Patient Details  Name: Patrick Mcgee MRN: [id number] Date of Birth: [birth year] Referring Provider (PT): Dr. Rosanna Randy   Encounter Date: 06/02/2019  PT End of Session - 06/02/19 1238    Visit Number  1    Number of Visits  25    Date for PT Re-Evaluation  08/25/19    PT Start Time  1115    PT Stop Time  1200    PT Time Calculation (min)  45 min    Equipment Utilized During Treatment  Gait belt    Activity Tolerance  Patient tolerated treatment well    Behavior During Therapy  [facility] for tasks assessed/performed       Past Medical History:  Diagnosis Date  . Acid reflux   . Anxiety   . Depression   . Diabetes mellitus without complication ([place])   . Hyperlipidemia   . Hypertension   . MRSA (methicillin resistant Staphylococcus aureus) infection 1992   history  . Myocardial infarction ([place]) 1995  . Sleep apnea    CPAP  . Urothelial cancer ([place]) 11/2017   Left Urothelial mass, chemo tx's    Past Surgical History:  Procedure Laterality Date  . CATARACT EXTRACTION Left   . COLONOSCOPY  2010   [facility]  . COLONOSCOPY WITH PROPOFOL N/A 10/04/2016   Procedure: COLONOSCOPY WITH PROPOFOL;  Surgeon: [name], MD;  Location: [facility] ENDOSCOPY;  Service: Endoscopy;  Laterality: N/A;  . [place], 2000, 2001, 2014  . CYSTOSCOPY W/ RETROGRADES Bilateral 12/10/2017   Procedure: CYSTOSCOPY WITH RETROGRADE PYELOGRAM;  Surgeon: [name], MD;  Location: [facility] ORS;  Service: Urology;  Laterality: Bilateral;  . CYSTOSCOPY W/ RETROGRADES Left 12/09/2018   Procedure: CYSTOSCOPY WITH RETROGRADE PYELOGRAM;  Surgeon: [name], MD;  Location: [facility] ORS;  Service: Urology;  Laterality: Left;  . CYSTOSCOPY WITH BIOPSY Left 12/09/2018   Procedure: CYSTOSCOPY WITH  URETERAL/RENAL PELVIC BIOPSY;  Surgeon: [name], MD;  Location: [facility] ORS;  Service: Urology;  Laterality: Left;  . CYSTOSCOPY WITH STENT PLACEMENT Left 12/10/2017   Procedure: CYSTOSCOPY WITH STENT PLACEMENT;  Surgeon: [name], MD;  Location: [facility] ORS;  Service: Urology;  Laterality: Left;  . CYSTOSCOPY WITH STENT PLACEMENT Left 12/09/2018   Procedure: CYSTOSCOPY WITH STENT PLACEMENT;  Surgeon: [name], MD;  Location: [facility] ORS;  Service: Urology;  Laterality: Left;  . CYSTOSCOPY WITH URETEROSCOPY Left 12/09/2018   Procedure: CYSTOSCOPY WITH URETEROSCOPY;  Surgeon: [name], MD;  Location: [facility] ORS;  Service: Urology;  Laterality: Left;  . EYE SURGERY Bilateral    cataract  . INGUINAL HERNIA REPAIR Right 08/09/2015   Procedure: HERNIA REPAIR INGUINAL ADULT;  Surgeon: [name], MD;  Location: [facility] ORS;  Service: General;  Laterality: Right;  . NASAL SINUS SURGERY    . nuclear stress test    . PORTA CATH INSERTION N/A 12/19/2017   Procedure: PORTA CATH INSERTION;  Surgeon: [name], MD;  Location: [place] CV LAB;  Service: Cardiovascular;  Laterality: N/A;  . URETERAL BIOPSY Left 12/10/2017   Procedure: URETERAL & renal PELVIS BIOPSY;  Surgeon: [name], MD;  Location: [facility] ORS;  Service: Urology;  Laterality: Left;  . URETEROSCOPY Left 12/10/2017   Procedure: URETEROSCOPY;  Surgeon: [name], MD;  Location: [facility] ORS;  Service: Urology;  Laterality: Left;  There were no vitals filed for this visit.   Subjective Assessment - 06/02/19 1113    Subjective  Unsteadiness and weakness    Pertinent History  Part of history borrowed from oncology note and verified with patient: Patient is a 73 year old male who was referred for foot drop. He started with Dr. [name] so far for his metastatic urothelial carcinoma.  This was originally diagnosed in May 2019. He was started on chemotherapy in June 2019 and was continued on 05/27/2018 for 8 cycles.  He  tolerated chemotherapy well except for chemo-induced anemia for which she has been getting Procrit every 2 weeks.  He did have response to his disease based on scans in August 2019.  However repeat scan on 05/31/2018 showed increase in the size of the primary tumor from 1.2 to 1.9 cm and increase in left para-aortic adenopathy from 0.9 to 1.3 cm.  Second line immunotherapy was recommended.  His initial biopsy specimen did not have enough sample to undergo FGFR mutation testing. He also has mild interstitial lung disease for which he sees pulmonary but he is not on home oxygen.  He has B12 deficiency for which he is on oral B12.  Also has diabetes and coronary artery disease. Patient noted to have disease progression in his lymph nodes in May 2020.  Repeat biopsy showed metastatic urothelial carcinoma which did not have a FGFR mutation.  He has been started on third line Padcev. There is still times when he feels fatigued but he is having more good days. He reports poor appetite. He was working with physical therapy on his foot drop, RLE weakness, and unsteadiness. He states that he gets considerable numbness for the 7-8 days following his chemotherapy treatment. He states that he is a diabetic and did have some numbness prior to starting chemotherapy. He states that he has a history of a low back disc bulge with RLE radicular symptoms. He used to get spinal injections but reports he hasn't had any injections in the last 10 years.    Limitations  Walking    Currently in Pain?  No/denies          SUBJECTIVE Chief complaint: Part of history borrowed from oncology note and verified with patient: Patient is a 73 year old male who was referred for foot drop. He started with Dr. [name] so far for his metastatic urothelial carcinoma. This was originally diagnosed in May 2019. He was started on chemotherapy in June 2019 and was continued on 05/27/2018 for 8 cycles. He tolerated chemotherapy well except for  chemo-induced anemia for which she has been getting Procrit every 2 weeks. He did have response to his disease based on scans in August 2019. However repeat scan on 05/31/2018 showed increase in the size of the primary tumor from 1.2 to 1.9 cm and increase in left para-aortic adenopathy from 0.9 to 1.3 cm. Second line immunotherapy was recommended. His initial biopsy specimen did not have enough sample to undergo FGFR mutation testing. He also has mild interstitial lung disease for which he sees pulmonary but he is not on home oxygen. He has B12 deficiency for which he is on oral B12. Also has diabetes and coronary artery disease.Patient noted to have disease progression in his lymph nodes in May 2020. Repeat biopsy showed metastatic urothelial carcinoma which did not have aFGFR mutation. He has been started on third line Padcev. There is still times when he feels fatigued but he is having more good days. He reports poor appetite. He  was working with physical therapy on his foot drop, RLE weakness, and unsteadiness. He states that he gets considerable numbness for the 7-8 days following his chemotherapy treatment. He states that he is a diabetic and did have some numbness prior to starting chemotherapy. He states that he has a history of a low back disc bulge with RLE radicular symptoms. He used to get spinal injections but reports he hasn't had any injections in the last 10 years. Imaging: No NCV testing Recent changes in overall health/medication: No besides continuing his chemotherapy treatment Directional pattern for falls: None but more to the right and front Prior history of physical therapy for balance: Previously receiving HH PT Follow-up appointment with MD: Pt reports follow-up appointment with Dr. Rosanna Randy in early January 2021 Red flags (bowel/bladder changes, saddle paresthesia, chills/fever, night sweats, unrelenting pain) Negative with the exception of current therapy for urothelial  carcinoma  OBJECTIVE  MUSCULOSKELETAL: Tremor: Absent Bulk: Normal  Posture Mild forward head and rounded shoulders.   Gait R foot slap noted during gait with decreased dorsiflexion during swing  Strength R/L 5/5 Hip flexion 5/5 Hip external rotation 5/5 Hip internal rotation 5/5 Knee extension 5/5 Knee flexion >10 reps/>10 reps Ankle Plantarflexion 3/4+ Ankle Dorsiflexion Absent R great toe extension, significantly diminished strength of digit 2-5 extension    NEUROLOGICAL:  Mental Status Patient is oriented to person, place and time.  Recent memory is intact.  Remote memory appears grossly intact however maybe some slight deficits noted. Not formally investigated on this date.  Attention span and concentration are intact.  Expressive speech is intact.  Patient's fund of knowledge is within normal limits for educational level.   Sensation Grossly intact to light touch bilateral UEs/LEs as determined by testing dermatomes however diminished on the right side at L2 and L5.  Proprioception and hot/cold testing deferred on this date  Reflexes Deferred on this date    FUNCTIONAL OUTCOME MEASURES   Results Comments  BERG 48/56 Fall risk, in need of intervention  TUG 8.8 seconds WNL  5TSTS 9.7 seconds WNL  30s sit to stand test 10 reps Below normative values, Progressive fatigue noted  10 Meter Gait Speed Self-selected: 9.5s = 1.05 m/s; Fastest: 6.6s = 1.5 m/s Below normative values for full community ambulation  ABC Scale 64.4% Below Cut-off            Objective measurements completed on examination: See above findings.              PT Education - 06/02/19 1237    Education Details  Plan of care and outcome measures    Person(s) Educated  Patient    Methods  Explanation    Comprehension  Verbalized understanding       PT Short Term Goals - 06/02/19 1242      PT SHORT TERM GOAL #1   Title  Pt will be independent with HEP in order to  improve strength and balance in order to decrease fall risk and improve function at home    Time  6    Period  Weeks    Status  New    Target Date  07/14/19        PT Long Term Goals - 06/02/19 1243      PT LONG TERM GOAL #1   Title  Pt will improve BERG by at least 3 points in order to demonstrate clinically significant improvement in balance.    Baseline  06/02/19: 48/56    Time  12    Period  Weeks    Status  New    Target Date  08/25/19      PT LONG TERM GOAL #2   Title  Pt will improve ABC by at least 13% in order to demonstrate clinically significant improvement in balance confidence.    Baseline  06/02/19: 64.4%    Time  12    Period  Weeks    Status  New    Target Date  08/25/19      PT LONG TERM GOAL #3   Title  Pt will increase 30s Sit to Stand Test to greater than or equal to 14 repetitions in order to demonstrate clinically significant improvement in LE endurance and decrease in fall risk.    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 Pt presents with R ankle dorsiflexion weakness resulting in foot drop during gait. Otherwise his leg strength appears grossly WNL however his BLE endurance is lacking as noted by 10 sit to stands on the 30s Sit to Stand Test. His BERG is 48/56 which indicates deficits in balance and places pt in a risk for future falls. His balance confidence on the ABC is also 64.4% which is below the cut-off placing him at higher risk for falls. PT examination reveals deficits in strength, gait and balance. He will benefit from skilled PT services to address deficits in balance and decrease risk for future falls.    Personal Factors and Comorbidities  Age;Comorbidity 3+;Time since onset of  injury/illness/exacerbation    Comorbidities  Urothelial cancer, DM, previous lumbar disc bulge, interstitial pulmonary disease    Examination-Activity Limitations  Locomotion Level;Stairs;Transfers    Examination-Participation Restrictions  Community Activity;Laundry;Shop    Stability/Clinical Decision Making  Evolving/Moderate complexity    Clinical Decision Making  Moderate    Rehab Potential  Fair    PT Frequency  2x / week    PT Duration  12 weeks    PT Treatment/Interventions  ADLs/Self Care Home Management;Aquatic Therapy;Biofeedback;Canalith Repostioning;Cryotherapy;Iontophoresis 4mg /ml Dexamethasone;Moist Heat;Traction;Ultrasound;DME Instruction;Gait training;Stair training;Functional mobility training;Therapeutic activities;Therapeutic exercise;Balance training;Neuromuscular re-education;Patient/family education;Orthotic Fit/Training;Manual techniques;Dry needling;Energy conservation;Vestibular;Joint Manipulations    PT Next Visit Plan  mCTSIB, DGI?, 6MWT, Initiate HEP after reviewed HH PT HEP    PT Home Exercise Plan  continue as prescribed by [facility] PT and bring to next session    Consulted and Agree with Plan of Care  Patient       Patient will benefit from skilled therapeutic intervention in order to improve the following deficits and impairments:  Abnormal gait, Decreased activity tolerance, Decreased balance, Decreased strength  Visit Diagnosis: Muscle weakness (generalized)  Unsteadiness on feet     Problem List Patient Active Problem List   Diagnosis Date Noted  . Chest pain 03/04/2019  . Anemia due to antineoplastic chemotherapy 03/03/2018  . Cough 03/03/2018  . Chemotherapy induced neutropenia ([place]) 01/22/2018  . B12 deficiency 01/01/2018  . Hypomagnesemia 12/21/2017  . Encounter for antineoplastic chemotherapy 12/21/2017  . Malignant neoplasm of kidney ([place])   . Urothelial cancer ([place]) 12/18/2017  . Malnutrition of moderate degree 12/18/2017  . Therapeutic  opioid induced constipation   .  Palliative care encounter   . Dehydration 12/17/2017  . Goals of care, counseling/discussion 12/13/2017  . Urothelial carcinoma of kidney, left ([place]) 12/07/2017  . Right inguinal hernia 07/17/2015  . Family history of colon cancer 07/17/2015  . Allergic rhinitis 11/12/2014  . Airway hyperreactivity 11/12/2014  . Atherosclerosis of coronary artery 11/12/2014  . Cheilitis 11/12/2014  . Narrowing of intervertebral disc space 11/12/2014  . Deflected nasal septum 11/12/2014  . HLD (hyperlipidemia) 11/12/2014  . BP (high blood pressure) 11/12/2014  . Adult hypothyroidism 11/12/2014  . Sleep apnea 11/12/2014  . Diabetes mellitus, type 2 ([place]) 11/12/2014  . Degenerative disc disease, lumbar 09/24/2012  . Furunculosis 04/23/2012   Phillips Grout PT, DPT, GCS  Makai Agostinelli 06/02/2019, 12:46 PM  [place] MAIN [facility] SERVICES [address] [place], [place], [address] Phone: [phone]   Fax:  [phone]  Name: KWAMAINE CHALLIS MRN: [id number] Date of Birth: 05-02-1946

## 2019-06-04 ENCOUNTER — Ambulatory Visit: Payer: Medicare Other

## 2019-06-04 ENCOUNTER — Other Ambulatory Visit: Payer: Self-pay

## 2019-06-04 VITALS — BP 117/70 | HR 90

## 2019-06-04 DIAGNOSIS — R2681 Unsteadiness on feet: Secondary | ICD-10-CM | POA: Diagnosis not present

## 2019-06-04 DIAGNOSIS — M6281 Muscle weakness (generalized): Secondary | ICD-10-CM | POA: Diagnosis not present

## 2019-06-04 NOTE — Therapy (Signed)
[place] MAIN [facility] SERVICES [address] [place], [place], [address] Phone: [phone]   Fax:  [phone]  Physical Therapy Treatment  Patient Details  Name: Patrick Mcgee MRN: [id number] Date of Birth: Feb 15, 1946 Referring Provider (PT): Dr. Rosanna Randy   Encounter Date: 06/04/2019  PT End of Session - 06/04/19 1238    Visit Number  2    Number of Visits  25    Date for PT Re-Evaluation  08/25/19    PT Start Time  1125    PT Stop Time  1205    PT Time Calculation (min)  40 min    Equipment Utilized During Treatment  Gait belt    Activity Tolerance  Patient tolerated treatment well    Behavior During Therapy  [facility] for tasks assessed/performed       Past Medical History:  Diagnosis Date  . Acid reflux   . Anxiety   . Depression   . Diabetes mellitus without complication ([place])   . Hyperlipidemia   . Hypertension   . MRSA (methicillin resistant Staphylococcus aureus) infection 1992   history  . Myocardial infarction ([place]) 1995  . Sleep apnea    CPAP  . Urothelial cancer ([place]) 11/2017   Left Urothelial mass, chemo tx's    Past Surgical History:  Procedure Laterality Date  . CATARACT EXTRACTION Left   . COLONOSCOPY  2010   [facility]  . COLONOSCOPY WITH PROPOFOL N/A 10/04/2016   Procedure: COLONOSCOPY WITH PROPOFOL;  Surgeon: [name], MD;  Location: [facility] ENDOSCOPY;  Service: Endoscopy;  Laterality: N/A;  . [place], 2000, 2001, 2014  . CYSTOSCOPY W/ RETROGRADES Bilateral 12/10/2017   Procedure: CYSTOSCOPY WITH RETROGRADE PYELOGRAM;  Surgeon: [name], MD;  Location: [facility] ORS;  Service: Urology;  Laterality: Bilateral;  . CYSTOSCOPY W/ RETROGRADES Left 12/09/2018   Procedure: CYSTOSCOPY WITH RETROGRADE PYELOGRAM;  Surgeon: [name], MD;  Location: [facility] ORS;  Service: Urology;  Laterality: Left;  . CYSTOSCOPY WITH BIOPSY Left 12/09/2018   Procedure: CYSTOSCOPY WITH  URETERAL/RENAL PELVIC BIOPSY;  Surgeon: [name], MD;  Location: [facility] ORS;  Service: Urology;  Laterality: Left;  . CYSTOSCOPY WITH STENT PLACEMENT Left 12/10/2017   Procedure: CYSTOSCOPY WITH STENT PLACEMENT;  Surgeon: [name], MD;  Location: [facility] ORS;  Service: Urology;  Laterality: Left;  . CYSTOSCOPY WITH STENT PLACEMENT Left 12/09/2018   Procedure: CYSTOSCOPY WITH STENT PLACEMENT;  Surgeon: [name], MD;  Location: [facility] ORS;  Service: Urology;  Laterality: Left;  . CYSTOSCOPY WITH URETEROSCOPY Left 12/09/2018   Procedure: CYSTOSCOPY WITH URETEROSCOPY;  Surgeon: [name], MD;  Location: [facility] ORS;  Service: Urology;  Laterality: Left;  . EYE SURGERY Bilateral    cataract  . INGUINAL HERNIA REPAIR Right 08/09/2015   Procedure: HERNIA REPAIR INGUINAL ADULT;  Surgeon: [name], MD;  Location: [facility] ORS;  Service: General;  Laterality: Right;  . NASAL SINUS SURGERY    . nuclear stress test    . PORTA CATH INSERTION N/A 12/19/2017   Procedure: PORTA CATH INSERTION;  Surgeon: [name], MD;  Location: [place] CV LAB;  Service: Cardiovascular;  Laterality: N/A;  . URETERAL BIOPSY Left 12/10/2017   Procedure: URETERAL & renal PELVIS BIOPSY;  Surgeon: [name], MD;  Location: [facility] ORS;  Service: Urology;  Laterality: Left;  . URETEROSCOPY Left 12/10/2017   Procedure: URETEROSCOPY;  Surgeon: [name], MD;  Location: [facility] ORS;  Service: Urology;  Laterality: Left;  Vitals:   06/04/19 1126  BP: 117/70  Pulse: 90  SpO2: 100%    Subjective Assessment - 06/04/19 1237    Subjective  Pt reports that he is doing well today but is fatigued from his chemo treatments. He denies any pain upon arrival today. No specific questions currently. He forgot to bring his HEP with him to therapy today.    Pertinent History  Part of history borrowed from oncology note and verified with patient: Patient is a 73 year old male who was referred for foot drop. He started  with Dr. [name] so far for his metastatic urothelial carcinoma.  This was originally diagnosed in May 2019. He was started on chemotherapy in June 2019 and was continued on 05/27/2018 for 8 cycles.  He tolerated chemotherapy well except for chemo-induced anemia for which she has been getting Procrit every 2 weeks.  He did have response to his disease based on scans in August 2019.  However repeat scan on 05/31/2018 showed increase in the size of the primary tumor from 1.2 to 1.9 cm and increase in left para-aortic adenopathy from 0.9 to 1.3 cm.  Second line immunotherapy was recommended.  His initial biopsy specimen did not have enough sample to undergo FGFR mutation testing. He also has mild interstitial lung disease for which he sees pulmonary but he is not on home oxygen.  He has B12 deficiency for which he is on oral B12.  Also has diabetes and coronary artery disease. Patient noted to have disease progression in his lymph nodes in May 2020.  Repeat biopsy showed metastatic urothelial carcinoma which did not have a FGFR mutation.  He has been started on third line Padcev. There is still times when he feels fatigued but he is having more good days. He reports poor appetite. He was working with physical therapy on his foot drop, RLE weakness, and unsteadiness. He states that he gets considerable numbness for the 7-8 days following his chemotherapy treatment. He states that he is a diabetic and did have some numbness prior to starting chemotherapy. He states that he has a history of a low back disc bulge with RLE radicular symptoms. He used to get spinal injections but reports he hasn't had any injections in the last 10 years.    Limitations  Walking    Currently in Pain?  No/denies         [facility] PT Assessment - 06/04/19 1135      Standardized Balance Assessment   Standardized Balance Assessment  Dynamic Gait Index      Dynamic Gait Index   Level Surface  Normal    Change in Gait Speed  Normal     Gait with Horizontal Head Turns  Normal    Gait with Vertical Head Turns  Normal    Gait and Pivot Turn  Mild Impairment    Step Over Obstacle  Normal    Step Around Obstacles  Normal    Steps  Normal    Total Score  23       TREATMENT   Neuromuscular Re-education  Administered DGI with patient who scored 23/24; Administered mCTSIB (see results below) Tandem gait in // bars without UE support x 4 lengths; Trialed R AFO and discussed possible adaptive options for R foot drop, he demonstrates considerably improved gait with less foot slap during gait while using AFO. No imbalance noted;    Ther-ex  NuStep L1-4 x 5 minutes during session to work on endurance, therapist monitored for fatigue  and adjusted resistance accordingly, vitals monitored intermittently (HR 110, SpO2: 93%); Quantum single leg press 60# 2 x 15, bilateral; Seated marches with green tband x 15; Seated clams with manual resistance x 15; Seated adductor squeeze with manual resistance x 15; Seated R ankle dorsiflexion with leg extended and foot resting on step, light manual resistance 2 x 15;   POSTURAL CONTROL TESTS:   Clinical Test of Sensory Interaction for Balance    (CTSIB):  CONDITION TIME STRATEGY SWAY  Eyes open, firm surface 30 seconds ankle 1+  Eyes closed, firm surface 30 seconds Ankle/knee 2+  Eyes open, foam surface 30 seconds ankle 2+  Eyes closed, foam surface 5 seconds Ankle, knee, no hip  4+      Pt educated throughout session about proper posture and technique with exercises. Improved exercise technique, movement at target joints, use of target muscles after min to mod verbal, visual, tactile cues.    Pt demonstrates excellent motivation today despite reporting fatigue from his chemo treatment. Initiated strengthening and balance exercise with patient today. He forgot to bring his HEP so he will bring to next session at which time we will review/modify as necessary. Trialed R AFO and  discussed possible adaptive options for R foot drop. Pt demonstrates considerably improved gait with less foot slap during gait while using AFO without imbalance. Pt will let therapist know if/when he is interested in pursuing further and will make a referral to orthotist. Pt encouraged to continue his current HEP and follow-up as scheduled. Pt will benefit from PT services to address deficits in strength, balance, and mobility in order to return to full function at home.                        PT Short Term Goals - 06/02/19 1242      PT SHORT TERM GOAL #1   Title  Pt will be independent with HEP in order to improve strength and balance in order to decrease fall risk and improve function at home    Time  6    Period  Weeks    Status  New    Target Date  07/14/19        PT Long Term Goals - 06/02/19 1243      PT LONG TERM GOAL #1   Title  Pt will improve BERG by at least 3 points in order to demonstrate clinically significant improvement in balance.    Baseline  06/02/19: 48/56    Time  12    Period  Weeks    Status  New    Target Date  08/25/19      PT LONG TERM GOAL #2   Title  Pt will improve ABC by at least 13% in order to demonstrate clinically significant improvement in balance confidence.    Baseline  06/02/19: 64.4%    Time  12    Period  Weeks    Status  New    Target Date  08/25/19      PT LONG TERM GOAL #3   Title  Pt will increase 30s Sit to Stand Test to greater than or equal to 14 repetitions in order to demonstrate clinically significant improvement in LE endurance and decrease in fall risk.    Baseline  06/02/19: 10 repetitions    Time  12    Period  Weeks    Status  New    Target Date  08/25/19  Plan - 06/04/19 1128    Clinical Impression Statement  Pt demonstrates excellent motivation today despite reporting fatigue from his chemo treatment. Initiated strengthening and balance exercise with patient today. He forgot to  bring his HEP so he will bring to next session at which time we will review/modify as necessary. Trialed R AFO and discussed possible adaptive options for R foot drop. Pt demonstrates considerably improved gait with less foot slap during gait while using AFO without imbalance. Pt will let therapist know if/when he is interested in pursuing further and will make a referral to orthotist. Pt encouraged to continue his current HEP and follow-up as scheduled. Pt will benefit from PT services to address deficits in strength, balance, and mobility in order to return to full function at home.    Personal Factors and Comorbidities  Age;Comorbidity 3+;Time since onset of injury/illness/exacerbation    Comorbidities  Urothelial cancer, DM, previous lumbar disc bulge, interstitial pulmonary disease    Examination-Activity Limitations  Locomotion Level;Stairs;Transfers    Examination-Participation Restrictions  Community Activity;Laundry;Shop    Stability/Clinical Decision Making  Evolving/Moderate complexity    Rehab Potential  Fair    PT Frequency  2x / week    PT Duration  12 weeks    PT Treatment/Interventions  ADLs/Self Care Home Management;Aquatic Therapy;Biofeedback;Canalith Repostioning;Cryotherapy;Iontophoresis 4mg /ml Dexamethasone;Moist Heat;Traction;Ultrasound;DME Instruction;Gait training;Stair training;Functional mobility training;Therapeutic activities;Therapeutic exercise;Balance training;Neuromuscular re-education;Patient/family education;Orthotic Fit/Training;Manual techniques;Dry needling;Energy conservation;Vestibular;Joint Manipulations    PT Next Visit Plan  6MWT, Initiate HEP after reviewed HH PT HEP    PT Home Exercise Plan  continue as prescribed by [facility] PT and bring to next session    Consulted and Agree with Plan of Care  Patient       Patient will benefit from skilled therapeutic intervention in order to improve the following deficits and impairments:  Abnormal gait, Decreased activity  tolerance, Decreased balance, Decreased strength  Visit Diagnosis: Muscle weakness (generalized)  Unsteadiness on feet     Problem List Patient Active Problem List   Diagnosis Date Noted  . Chest pain 03/04/2019  . Anemia due to antineoplastic chemotherapy 03/03/2018  . Cough 03/03/2018  . Chemotherapy induced neutropenia ([place]) 01/22/2018  . B12 deficiency 01/01/2018  . Hypomagnesemia 12/21/2017  . Encounter for antineoplastic chemotherapy 12/21/2017  . Malignant neoplasm of kidney ([employer])   . Urothelial cancer ([place]) 12/18/2017  . Malnutrition of moderate degree 12/18/2017  . Therapeutic opioid induced constipation   . Palliative care encounter   . Dehydration 12/17/2017  . Goals of care, counseling/discussion 12/13/2017  . Urothelial carcinoma of kidney, left ([place]) 12/07/2017  . Right inguinal hernia 07/17/2015  . Family history of colon cancer 07/17/2015  . Allergic rhinitis 11/12/2014  . Airway hyperreactivity 11/12/2014  . Atherosclerosis of coronary artery 11/12/2014  . Cheilitis 11/12/2014  . Narrowing of intervertebral disc space 11/12/2014  . Deflected nasal septum 11/12/2014  . HLD (hyperlipidemia) 11/12/2014  . BP (high blood pressure) 11/12/2014  . Adult hypothyroidism 11/12/2014  . Sleep apnea 11/12/2014  . Diabetes mellitus, type 2 ([place]) 11/12/2014  . Degenerative disc disease, lumbar 09/24/2012  . Furunculosis 04/23/2012   Phillips Grout PT, DPT, GCS  Huprich,Jason 06/04/2019, 12:44 PM  [place] MAIN [facility] SERVICES [address] [place], [place], [address] Phone: [phone]   Fax:  [phone]  Name: ADALBERT DUMITRESCU MRN: [id number] Date of Birth: 22-Jun-1946

## 2019-06-06 ENCOUNTER — Other Ambulatory Visit: Payer: Self-pay | Admitting: Family Medicine

## 2019-06-09 ENCOUNTER — Other Ambulatory Visit: Payer: Medicare Other

## 2019-06-09 ENCOUNTER — Ambulatory Visit: Payer: Medicare Other

## 2019-06-09 ENCOUNTER — Other Ambulatory Visit: Payer: Self-pay

## 2019-06-09 DIAGNOSIS — Z515 Encounter for palliative care: Secondary | ICD-10-CM

## 2019-06-09 NOTE — Progress Notes (Signed)
COMMUNITY PALLIATIVE CARE SW NOTE  PATIENT NAME: Patrick Mcgee DOB: [birth year] MRN: [id number]  PRIMARY CARE PROVIDER: Jerrol Banana., MD  RESPONSIBLE PARTY:  Acct ID - Guarantor Home Phone Work Phone Relationship Acct Type  [id number] Patrick Mcgee [phone]  Self P/F     [place], [place], [place] [address]     PLAN OF CARE and INTERVENTIONS:             1. GOALS OF CARE/ ADVANCE CARE PLANNING:  Patient's goal is to continuewithtreatmentat this time, but discuss following his next scan. Patient wants to maintain his quality of life.Patient is DNR, form is in the home.HCPOA is Patrick Mcgee (patient's daughter).   2. SOCIAL/EMOTIONAL/SPIRITUAL ASSESSMENT/ INTERVENTIONS:  SW and RN completedhomevisit with patient and Patrick Mcgee (patient's wife).Patient denies pain. Patient noted that he is doing PT outpatient at [facility] [address] and this is going well. Patient said PT reports that patient has "drop foot" and recommended a brace for support, patient plans to order this. Patient is sleeping well, continues to stay up late at night. Patient and Patrick Mcgee are focused on his appetite and patient is trying to maintain or gain weight. Patient said that he enjoyed thanksgiving with his family.  3. PATIENT/CAREGIVER EDUCATION/ COPING:  Patient was alert, engaged. Patient is positive and open, honest in processing his feelings. Patient acknowledged limited options for treating his cancer. Patient is focused on quality of life. Patient copes with his faith and prayer. Family is supportive. Team encouraged ongoing conversations about patient's plan of care. 4. PERSONAL EMERGENCY PLAN:  Patient and family will call 9-1-1 for emergencies. 5. COMMUNITY RESOURCES COORDINATION/ HEALTH CARE NAVIGATION:  Patient is going to outpatient PT. 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 Patient has follow-up on Tuesday with oncology and Patrick Mcgee is going to go with him to this visit. Patent is planning  on discussing his CT scan and would like to wait until after Christmas.  6. FINANCIAL/LEGAL CONCERNS/INTERVENTIONS:  None.     SOCIAL HX:  Social History   Tobacco Use  . Smoking status: Former Smoker    Types: Cigars    Quit date: 10/09/1978    Years since quitting: 40.6  . Smokeless tobacco: Former Systems developer    Types: Chew    Quit date: 12/04/1988  . Tobacco comment: on occasion  Substance Use Topics  . Alcohol use: Not Currently    CODE STATUS:   Code Status: Prior (DNR) ADVANCED DIRECTIVES: Y MOST FORM COMPLETE:  Reviewing, but may decide to hold off for now.  HOSPICE EDUCATION PROVIDED: None.  PPS: Patient is independent of ADLs but needs assistance with meal prep. Continuing PT outpatient.   I spent30minutes with patient/family, from1:00-2:00pproviding education, support and consultation.  Patrick Lombard, LCSW

## 2019-06-09 NOTE — Progress Notes (Signed)
PATIENT NAME: Patrick Mcgee DOB: 11-17-1945 MRN: [id number]  PRIMARY CARE PROVIDER: Jerrol Mcgee., MD  RESPONSIBLE PARTY:  Acct ID - Guarantor Home Phone Work Phone Relationship Acct Type  [id number] [name], [name] [phone]  Self P/F     [place], [place], [place] [address]    PLAN OF CARE and INTERVENTIONS:               1.  GOALS OF CARE/ ADVANCE CARE PLANNING:  Remain in home with wife and continue to receive chemotherapy for as long as he can tolerate side effects of chemo.                2.  PATIENT/CAREGIVER EDUCATION:  Education on fall precautions, education on s/s of infection, support               3.  DISEASE STATUS: SW and RN made joint home visit. Patients wife Patrick Mcgee in home with patient. Patient sitting in chair in living room. Patient's wife Patrick Mcgee states she and patient put out Christmas decorations yesterday. Patient complains of having increasing numbness in fingertips, feet and lower legs. Patient receiving chemo for 2 weeks then will have two weeks off chemo. Patient states he is not sure how much longer he will be able to tolerate receiving chemo. Patient states he feels sure oncologist will perform scan after Christmas. Patient reports his current weight is 149 lb. Patient continuing to receive outpatient PT. Patient reports numbness and tingling is worse in his right foot. Patient is also having right foot drop. Patient states PT has made a recommendation for a foot brace to assist with aiding him to ambulate without slapping his foot against floor. Patient to discuss obtaining foot brace with PT.  Patient reports he feels sore in left upper side chest area.  Patient feels this is due to putting up Christmas decorations.  Patient reports his appetite has improved. Patient reports he has been sleeping well. Patient's vital signs are stable and patient denies having shortness of breath or cough. Patient continues to receive acupuncture and states he will receive  treatment later today. Patient and wife remain in agreement with palliative care services. Patient and wife encouraged to contact palliative care with questions or concerns.      HISTORY OF PRESENT ILLNESS: Patient is a 73 year old patient who continues to reside in home with wife Patrick Mcgee.  Patient being seen monthly ad PRN by [facility] team.   CODE STATUS: DNR  ADVANCED DIRECTIVES: Y MOST FORM: No PPS: 50%   PHYSICAL EXAM:   VITALS: Today's Vitals   06/09/19 1330  BP: 130/90  Pulse: 78  Resp: 18  Temp: (!) 97.3 F (36.3 C)  TempSrc: Temporal  SpO2: 96%  Weight: 149 lb (67.6 kg)  PainSc: 2   PainLoc: Chest    LUNGS: clear to auscultation  CARDIAC: Cor RRR  EXTREMITIES: no edema in lower extremities, patient does have numbing and tingling in fingers and lower extremities.  SKIN: Skin color, texture, turgor normal. No rashes or lesions  NEURO: positive for gait problems and weakness       Patrick Simmer, RN

## 2019-06-11 ENCOUNTER — Other Ambulatory Visit: Payer: Self-pay | Admitting: *Deleted

## 2019-06-11 ENCOUNTER — Other Ambulatory Visit: Payer: Self-pay

## 2019-06-11 ENCOUNTER — Ambulatory Visit: Payer: Medicare Other | Attending: Family Medicine

## 2019-06-11 DIAGNOSIS — M6281 Muscle weakness (generalized): Secondary | ICD-10-CM | POA: Diagnosis present

## 2019-06-11 DIAGNOSIS — R2681 Unsteadiness on feet: Secondary | ICD-10-CM | POA: Diagnosis present

## 2019-06-11 MED ORDER — GABAPENTIN 300 MG PO CAPS: 300.0000 mg | ORAL_CAPSULE | Freq: Three times a day (TID) | ORAL | 0 refills | Status: DC

## 2019-06-11 NOTE — Therapy (Signed)
[place] MAIN [facility] SERVICES [address] [place], [place], [address] Phone: [phone]   Fax:  [phone]  Physical Therapy Treatment  Patient Details  Name: Patrick Mcgee MRN: [id number] Date of Birth: 1945/12/06 Referring Provider (PT): Dr. Rosanna Randy   Encounter Date: 06/11/2019  PT End of Session - 06/11/19 1122    Visit Number  3    Number of Visits  25    Date for PT Re-Evaluation  08/25/19    PT Start Time  1117    PT Stop Time  1157    PT Time Calculation (min)  40 min    Equipment Utilized During Treatment  Gait belt    Activity Tolerance  Patient limited by fatigue;Patient tolerated treatment well;No increased pain    Behavior During Therapy  WFL for tasks assessed/performed       Past Medical History:  Diagnosis Date  . Acid reflux   . Anxiety   . Depression   . Diabetes mellitus without complication ([place])   . Hyperlipidemia   . Hypertension   . MRSA (methicillin resistant Staphylococcus aureus) infection 1992   history  . Myocardial infarction ([place]) 1995  . Sleep apnea    CPAP  . Urothelial cancer ([place]) 11/2017   Left Urothelial mass, chemo tx's    Past Surgical History:  Procedure Laterality Date  . CATARACT EXTRACTION Left   . COLONOSCOPY  2010   [facility]  . COLONOSCOPY WITH PROPOFOL N/A 10/04/2016   Procedure: COLONOSCOPY WITH PROPOFOL;  Surgeon: [name], MD;  Location: [facility] ENDOSCOPY;  Service: Endoscopy;  Laterality: N/A;  . [place], 2000, 2001, 2014  . CYSTOSCOPY W/ RETROGRADES Bilateral 12/10/2017   Procedure: CYSTOSCOPY WITH RETROGRADE PYELOGRAM;  Surgeon: [name], MD;  Location: [facility] ORS;  Service: Urology;  Laterality: Bilateral;  . CYSTOSCOPY W/ RETROGRADES Left 12/09/2018   Procedure: CYSTOSCOPY WITH RETROGRADE PYELOGRAM;  Surgeon: [name], MD;  Location: [facility] ORS;  Service: Urology;  Laterality: Left;  . CYSTOSCOPY WITH BIOPSY Left  12/09/2018   Procedure: CYSTOSCOPY WITH URETERAL/RENAL PELVIC BIOPSY;  Surgeon: [name], MD;  Location: [facility] ORS;  Service: Urology;  Laterality: Left;  . CYSTOSCOPY WITH STENT PLACEMENT Left 12/10/2017   Procedure: CYSTOSCOPY WITH STENT PLACEMENT;  Surgeon: [name], MD;  Location: [facility] ORS;  Service: Urology;  Laterality: Left;  . CYSTOSCOPY WITH STENT PLACEMENT Left 12/09/2018   Procedure: CYSTOSCOPY WITH STENT PLACEMENT;  Surgeon: [name], MD;  Location: [facility] ORS;  Service: Urology;  Laterality: Left;  . CYSTOSCOPY WITH URETEROSCOPY Left 12/09/2018   Procedure: CYSTOSCOPY WITH URETEROSCOPY;  Surgeon: [name], MD;  Location: [facility] ORS;  Service: Urology;  Laterality: Left;  . EYE SURGERY Bilateral    cataract  . INGUINAL HERNIA REPAIR Right 08/09/2015   Procedure: HERNIA REPAIR INGUINAL ADULT;  Surgeon: [name], MD;  Location: [facility] ORS;  Service: General;  Laterality: Right;  . NASAL SINUS SURGERY    . nuclear stress test    . PORTA CATH INSERTION N/A 12/19/2017   Procedure: PORTA CATH INSERTION;  Surgeon: [name], MD;  Location: [facility] CV LAB;  Service: Cardiovascular;  Laterality: N/A;  . URETERAL BIOPSY Left 12/10/2017   Procedure: URETERAL & renal PELVIS BIOPSY;  Surgeon: [name], MD;  Location: [facility] ORS;  Service: Urology;  Laterality: Left;  . URETEROSCOPY Left 12/10/2017   Procedure: URETEROSCOPY;  Surgeon: [name], MD;  Location: [facility] ORS;  Service: Urology;  Laterality: Left;    There were no vitals filed for this visit.  Subjective Assessment - 06/11/19 1122    Subjective  Pt doing well today. No updates since lat session. Denies pain. Pt reports no falls since last session. He bring sin his HHPT HEP for review and update.    Pertinent History  Part of history borrowed from oncology note and verified with patient: Patient is a 73 year old male who was referred for foot drop. He started with Dr. [name] so far for his  metastatic urothelial carcinoma.  This was originally diagnosed in May 2019. He was started on chemotherapy in June 2019 and was continued on 05/27/2018 for 8 cycles.  He tolerated chemotherapy well except for chemo-induced anemia for which she has been getting Procrit every 2 weeks.  He did have response to his disease based on scans in August 2019.  However repeat scan on 05/31/2018 showed increase in the size of the primary tumor from 1.2 to 1.9 cm and increase in left para-aortic adenopathy from 0.9 to 1.3 cm.  Second line immunotherapy was recommended.  His initial biopsy specimen did not have enough sample to undergo FGFR mutation testing. He also has mild interstitial lung disease for which he sees pulmonary but he is not on home oxygen.  He has B12 deficiency for which he is on oral B12.  Also has diabetes and coronary artery disease. Patient noted to have disease progression in his lymph nodes in May 2020.  Repeat biopsy showed metastatic urothelial carcinoma which did not have a FGFR mutation.  He has been started on third line Padcev. There is still times when he feels fatigued but he is having more good days. He reports poor appetite. He was working with physical therapy on his foot drop, RLE weakness, and unsteadiness. He states that he gets considerable numbness for the 7-8 days following his chemotherapy treatment. He states that he is a diabetic and did have some numbness prior to starting chemotherapy. He states that he has a history of a low back disc bulge with RLE radicular symptoms. He used to get spinal injections but reports he hasn't had any injections in the last 10 years.    Currently in Pain?  No/denies      INTERVENTION THIS DATE:   Therapeutic Exercise  -NuStep L2 x 4 minutes during session to work on endurance, therapist monitored for fatigue and adjusted resistance accordingly, SEAT 11, Arms 10.  -6MWT: 1356ft, 1.11m/s,  no AD, no LOB, Rt foot slap without flat foot  strike  Neuromuscular Reeducation -Airex trunk/head rotation 5lb straight weight 5x bilat normal stance, 5x narrow stance (not very challenging for patient)  -Airex normal stance eyes closed x2 minutes (3 complete LOB requiring maxA for recovery) -Narrow stance airex overhead doorframe taps with ball x10; cues to follow ball with eyes and head.  -2x4 balance beam 8 lengths, min-A, multiple LOB self recovered, 1 fall to half kneeling onto Left knee, fall controlled and lowered by pt/author.  -SLS with cone taps 11:00, 12:00, 1:00 12x each foot, min-modA for balance  -resisted cable walking 12.5lb resistance, 1x46ft fwd, left, right, retro   PT Short Term Goals - 06/02/19 1242      PT SHORT TERM GOAL #1   Title  Pt will be independent with HEP in order to improve strength and balance in order to decrease fall risk and improve function at home    Time  6    Period  Weeks    Status  New    Target Date  07/14/19        PT Long Term Goals - 06/02/19 1243      PT LONG TERM GOAL #1   Title  Pt will improve BERG by at least 3 points in order to demonstrate clinically significant improvement in balance.    Baseline  06/02/19: 48/56    Time  12    Period  Weeks    Status  New    Target Date  08/25/19      PT LONG TERM GOAL #2   Title  Pt will improve ABC by at least 13% in order to demonstrate clinically significant improvement in balance confidence.    Baseline  06/02/19: 64.4%    Time  12    Period  Weeks    Status  New    Target Date  08/25/19      PT LONG TERM GOAL #3   Title  Pt will increase 30s Sit to Stand Test to greater than or equal to 14 repetitions in order to demonstrate clinically significant improvement in LE endurance and decrease in fall risk.    Baseline  06/02/19: 10 repetitions    Time  12    Period  Weeks    Status  New    Target Date  08/25/19            Plan - 06/11/19 1123    Clinical Impression Statement  Continued with strengthening and balance  intervention this date. HEP is reviewed with patient, scanned into chart for further review by evaluating therapist. Continues to establish an appropriate balance intervention in session. Pt does quit ewell with higher level activities and is able to progress to more challenging activity. On balance beam, pt has a LOB with an ankle instability and controlled collapse to half kneeling without injury. 6MWT completed. Pt continues to make gradual progress toward goals.    Rehab Potential  Fair    PT Frequency  2x / week    PT Duration  12 weeks    PT Treatment/Interventions  ADLs/Self Care Home Management;Aquatic Therapy;Biofeedback;Canalith Repostioning;Cryotherapy;Iontophoresis 4mg /ml Dexamethasone;Moist Heat;Traction;Ultrasound;DME Instruction;Gait training;Stair training;Functional mobility training;Therapeutic activities;Therapeutic exercise;Balance training;Neuromuscular re-education;Patient/family education;Orthotic Fit/Training;Manual techniques;Dry needling;Energy conservation;Vestibular;Joint Manipulations    PT Next Visit Plan  6MWT, Initiate HEP after reviewed HH PT HEP    PT Home Exercise Plan  continue as prescribed by [facility] PT and bring to next session    Consulted and Agree with Plan of Care  Patient       Patient will benefit from skilled therapeutic intervention in order to improve the following deficits and impairments:  Abnormal gait, Decreased activity tolerance, Decreased balance, Decreased strength  Visit Diagnosis: Muscle weakness (generalized)  Unsteadiness on feet     Problem List Patient Active Problem List   Diagnosis Date Noted  . Chest pain 03/04/2019  . Anemia due to antineoplastic chemotherapy 03/03/2018  . Cough 03/03/2018  . Chemotherapy induced neutropenia ([place]) 01/22/2018  . B12 deficiency 01/01/2018  . Hypomagnesemia 12/21/2017  . Encounter for antineoplastic chemotherapy 12/21/2017  . Malignant neoplasm of kidney ([place])   . Urothelial cancer ([place])  12/18/2017  . Malnutrition of moderate degree 12/18/2017  . Therapeutic opioid induced constipation   . Palliative care encounter   . Dehydration 12/17/2017  . Goals of care, counseling/discussion 12/13/2017  . Urothelial carcinoma of kidney, left ([place]) 12/07/2017  . Right inguinal hernia 07/17/2015  . Family history of colon cancer  07/17/2015  . Allergic rhinitis 11/12/2014  . Airway hyperreactivity 11/12/2014  . Atherosclerosis of coronary artery 11/12/2014  . Cheilitis 11/12/2014  . Narrowing of intervertebral disc space 11/12/2014  . Deflected nasal septum 11/12/2014  . HLD (hyperlipidemia) 11/12/2014  . BP (high blood pressure) 11/12/2014  . Adult hypothyroidism 11/12/2014  . Sleep apnea 11/12/2014  . Diabetes mellitus, type 2 ([place]) 11/12/2014  . Degenerative disc disease, lumbar 09/24/2012  . Furunculosis 04/23/2012   12:13 PM, 06/11/19 Etta Grandchild, PT, DPT Physical Therapist - [facility]  Outpatient Physical Therapy- [place] [phone]     Etta Grandchild 06/11/2019, 11:25 AM  [place] MAIN [facility] SERVICES [address] [place], [place], [address] Phone: [phone]   Fax:  [phone]  Name: Patrick Mcgee MRN: [id number] Date of Birth: 04/13/1946

## 2019-06-16 ENCOUNTER — Other Ambulatory Visit: Payer: Self-pay

## 2019-06-16 ENCOUNTER — Encounter: Payer: Self-pay | Admitting: Oncology

## 2019-06-16 ENCOUNTER — Ambulatory Visit: Payer: Medicare Other

## 2019-06-16 DIAGNOSIS — M6281 Muscle weakness (generalized): Secondary | ICD-10-CM

## 2019-06-16 DIAGNOSIS — R2681 Unsteadiness on feet: Secondary | ICD-10-CM

## 2019-06-16 NOTE — Patient Instructions (Signed)
Access Code: [email or web address]  URL: [email or web address]  Date: 06/16/2019  Prepared by: Roxana Hires   Exercises Seated Hip Abduction with Resistance - 10 reps - 2 sets - 3s hold - 1x daily - 7x weekly Seated Hip Adduction Isometrics with Ball - 10 reps - 2 sets - 3s hold - 1x daily - 7x weekly Seated Toe Raise - 10 reps - 2 sets - 3s hold - 1x daily - 7x weekly Standing Repeated Hip Abduction with Resistance - 10 reps - 2 sets - 1x daily - 7x weekly Standing Repeated Hip Extension with Resistance - 10 reps - 2 sets - 1x daily - 7x weekly Sit to Stand without Arm Support - 10 reps - 2 sets - 1x daily - 7x weekly Single Leg Stance - 3 reps - 30s x 3 on each leg hold - 1x daily - 7x weekly

## 2019-06-16 NOTE — Progress Notes (Signed)
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 Patient is currently doing physical therapy to help.

## 2019-06-16 NOTE — Therapy (Signed)
[place] MAIN [facility] SERVICES [address] [place], [place], [address] Phone: [phone]   Fax:  [phone]  Physical Therapy Treatment  Patient Details  Name: Patrick Mcgee MRN: [id number] Date of Birth: [birth year] Referring Provider (PT): Dr. Rosanna Randy   Encounter Date: 06/16/2019  PT End of Session - 06/16/19 1025    Visit Number  4    Number of Visits  25    Date for PT Re-Evaluation  08/25/19    PT Start Time  1020    PT Stop Time  1100    PT Time Calculation (min)  40 min    Equipment Utilized During Treatment  Gait belt    Activity Tolerance  Patient tolerated treatment well    Behavior During Therapy  [facility] for tasks assessed/performed       Past Medical History:  Diagnosis Date  . Acid reflux   . Anxiety   . Depression   . Diabetes mellitus without complication ([place])   . Hyperlipidemia   . Hypertension   . MRSA (methicillin resistant Staphylococcus aureus) infection 1992   history  . Myocardial infarction ([place]) 1995  . Sleep apnea    CPAP  . Urothelial cancer ([place]) 11/2017   Left Urothelial mass, chemo tx's    Past Surgical History:  Procedure Laterality Date  . CATARACT EXTRACTION Left   . COLONOSCOPY  2010   [facility]  . COLONOSCOPY WITH PROPOFOL N/A 10/04/2016   Procedure: COLONOSCOPY WITH PROPOFOL;  Surgeon: [name], MD;  Location: [facility] ENDOSCOPY;  Service: Endoscopy;  Laterality: N/A;  . [place], 2000, 2001, 2014  . CYSTOSCOPY W/ RETROGRADES Bilateral 12/10/2017   Procedure: CYSTOSCOPY WITH RETROGRADE PYELOGRAM;  Surgeon: [name], MD;  Location: [facility] ORS;  Service: Urology;  Laterality: Bilateral;  . CYSTOSCOPY W/ RETROGRADES Left 12/09/2018   Procedure: CYSTOSCOPY WITH RETROGRADE PYELOGRAM;  Surgeon: [name], MD;  Location: [facility] ORS;  Service: Urology;  Laterality: Left;  . CYSTOSCOPY WITH BIOPSY Left 12/09/2018   Procedure: CYSTOSCOPY WITH  URETERAL/RENAL PELVIC BIOPSY;  Surgeon: [name], MD;  Location: [facility] ORS;  Service: Urology;  Laterality: Left;  . CYSTOSCOPY WITH STENT PLACEMENT Left 12/10/2017   Procedure: CYSTOSCOPY WITH STENT PLACEMENT;  Surgeon: [name], MD;  Location: [facility] ORS;  Service: Urology;  Laterality: Left;  . CYSTOSCOPY WITH STENT PLACEMENT Left 12/09/2018   Procedure: CYSTOSCOPY WITH STENT PLACEMENT;  Surgeon: [name], MD;  Location: [facility] ORS;  Service: Urology;  Laterality: Left;  . CYSTOSCOPY WITH URETEROSCOPY Left 12/09/2018   Procedure: CYSTOSCOPY WITH URETEROSCOPY;  Surgeon: [name], MD;  Location: [facility] ORS;  Service: Urology;  Laterality: Left;  . EYE SURGERY Bilateral    cataract  . INGUINAL HERNIA REPAIR Right 08/09/2015   Procedure: HERNIA REPAIR INGUINAL ADULT;  Surgeon: [name], MD;  Location: [facility] ORS;  Service: General;  Laterality: Right;  . NASAL SINUS SURGERY    . nuclear stress test    . PORTA CATH INSERTION N/A 12/19/2017   Procedure: PORTA CATH INSERTION;  Surgeon: [name], MD;  Location: [place] CV LAB;  Service: Cardiovascular;  Laterality: N/A;  . URETERAL BIOPSY Left 12/10/2017   Procedure: URETERAL & renal PELVIS BIOPSY;  Surgeon: [name], MD;  Location: [facility] ORS;  Service: Urology;  Laterality: Left;  . URETEROSCOPY Left 12/10/2017   Procedure: URETEROSCOPY;  Surgeon: [name], MD;  Location: [facility] ORS;  Service: Urology;  Laterality: Left;  There were no vitals filed for this visit.  Subjective Assessment - 06/16/19 1020    Subjective  Pt doing well today. No falls since last session. Denies pain. No specific questions or concerns at this time.    Pertinent History  Part of history borrowed from oncology note and verified with patient: Patient is a 73 year old male who was referred for foot drop. He started with Dr. [name] so far for his metastatic urothelial carcinoma.  This was originally diagnosed in May 2019. He was  started on chemotherapy in June 2019 and was continued on 05/27/2018 for 8 cycles.  He tolerated chemotherapy well except for chemo-induced anemia for which she has been getting Procrit every 2 weeks.  He did have response to his disease based on scans in August 2019.  However repeat scan on 05/31/2018 showed increase in the size of the primary tumor from 1.2 to 1.9 cm and increase in left para-aortic adenopathy from 0.9 to 1.3 cm.  Second line immunotherapy was recommended.  His initial biopsy specimen did not have enough sample to undergo FGFR mutation testing. He also has mild interstitial lung disease for which he sees pulmonary but he is not on home oxygen.  He has B12 deficiency for which he is on oral B12.  Also has diabetes and coronary artery disease. Patient noted to have disease progression in his lymph nodes in May 2020.  Repeat biopsy showed metastatic urothelial carcinoma which did not have a FGFR mutation.  He has been started on third line Padcev. There is still times when he feels fatigued but he is having more good days. He reports poor appetite. He was working with physical therapy on his foot drop, RLE weakness, and unsteadiness. He states that he gets considerable numbness for the 7-8 days following his chemotherapy treatment. He states that he is a diabetic and did have some numbness prior to starting chemotherapy. He states that he has a history of a low back disc bulge with RLE radicular symptoms. He used to get spinal injections but reports he hasn't had any injections in the last 10 years.    Currently in Pain?  No/denies            TREATMENT   Ther-ex  Xride warm-up with UE x 5 minutes, 45s intervals alternating between L2 and L5, fatigue and challenge monitored throughout and seat height/inclination adjusted; Quantum single leg press 75# 2 x 15 bilateral; Seated marches with manual resistance 2 x 15; Seated clams with manual resistance 2 x 15; Seated adductor squeeze  with manual resistance 2 x 15; Seated R ankle dorsiflexion x 10, attempted with yellow theraband however too difficult;   Neuromuscular Re-education  Tandem gait in // bars without UE support x 4 lengths; 1/2 foam roll (flat side up) balance without UE support x 60s; 1/2 foam roll (flat side up) tandem balance without UE support alternating forward LE x 60s each; HEP furnished and reviewed with patient;   Pt educated throughout session about proper posture and technique with exercises. Improved exercise technique, movement at target joints, use of target muscles after min to mod verbal, visual, tactile cues.    Pt demonstrates excellent motivation today. Reviewed previous HEP and provided updated program for patient to complete at home. He is able to increase his resistance on the leg press today during session. Attempted resisted R ankle DF however anterior tibialis is too weak to take resistance. Pt will let therapist know if/when he is interested in pursuing  further and will make a referral to orthotist. Pt will benefit from PT services to address deficits in strength, balance, and mobility in order to return to full function at home.                   PT Short Term Goals - 06/02/19 1242      PT SHORT TERM GOAL #1   Title  Pt will be independent with HEP in order to improve strength and balance in order to decrease fall risk and improve function at home    Time  6    Period  Weeks    Status  New    Target Date  07/14/19        PT Long Term Goals - 06/02/19 1243      PT LONG TERM GOAL #1   Title  Pt will improve BERG by at least 3 points in order to demonstrate clinically significant improvement in balance.    Baseline  06/02/19: 48/56    Time  12    Period  Weeks    Status  New    Target Date  08/25/19      PT LONG TERM GOAL #2   Title  Pt will improve ABC by at least 13% in order to demonstrate clinically significant improvement in balance confidence.     Baseline  06/02/19: 64.4%    Time  12    Period  Weeks    Status  New    Target Date  08/25/19      PT LONG TERM GOAL #3   Title  Pt will increase 30s Sit to Stand Test to greater than or equal to 14 repetitions in order to demonstrate clinically significant improvement in LE endurance and decrease in fall risk.    Baseline  06/02/19: 10 repetitions    Time  12    Period  Weeks    Status  New    Target Date  08/25/19            Plan - 06/16/19 1025    Clinical Impression Statement  Pt demonstrates excellent motivation today. Reviewed previous HEP and provided updated program for patient to complete at home. He is able to increase his resistance on the leg press today during session. Attempted resisted R ankle DF however anterior tibialis is too weak to take resistance. Pt will let therapist know if/when he is interested in pursuing further and will make a referral to orthotist. Pt will benefit from PT services to address deficits in strength, balance, and mobility in order to return to full function at home.    Personal Factors and Comorbidities  Age;Comorbidity 3+;Time since onset of injury/illness/exacerbation    Comorbidities  Urothelial cancer, DM, previous lumbar disc bulge, interstitial pulmonary disease    Examination-Activity Limitations  Locomotion Level;Stairs;Transfers    Examination-Participation Restrictions  Community Activity;Laundry;Shop    Stability/Clinical Decision Making  Evolving/Moderate complexity    Clinical Decision Making  Moderate    Rehab Potential  Fair    PT Frequency  2x / week    PT Duration  12 weeks    PT Treatment/Interventions  ADLs/Self Care Home Management;Aquatic Therapy;Biofeedback;Canalith Repostioning;Cryotherapy;Iontophoresis 4mg /ml Dexamethasone;Moist Heat;Traction;Ultrasound;DME Instruction;Gait training;Stair training;Functional mobility training;Therapeutic activities;Therapeutic exercise;Balance training;Neuromuscular  re-education;Patient/family education;Orthotic Fit/Training;Manual techniques;Dry needling;Energy conservation;Vestibular;Joint Manipulations    PT Next Visit Plan  Continue with strengthening and balance    PT Home Exercise Plan  continue as prescribed by [facility] PT and bring to next session  Consulted and Agree with Plan of Care  Patient       Patient will benefit from skilled therapeutic intervention in order to improve the following deficits and impairments:  Abnormal gait, Decreased activity tolerance, Decreased balance, Decreased strength  Visit Diagnosis: Muscle weakness (generalized)  Unsteadiness on feet     Problem List Patient Active Problem List   Diagnosis Date Noted  . Chest pain 03/04/2019  . Anemia due to antineoplastic chemotherapy 03/03/2018  . Cough 03/03/2018  . Chemotherapy induced neutropenia ([place]) 01/22/2018  . B12 deficiency 01/01/2018  . Hypomagnesemia 12/21/2017  . Encounter for antineoplastic chemotherapy 12/21/2017  . Malignant neoplasm of kidney ([place])   . Urothelial cancer ([place]) 12/18/2017  . Malnutrition of moderate degree 12/18/2017  . Therapeutic opioid induced constipation   . Palliative care encounter   . Dehydration 12/17/2017  . Goals of care, counseling/discussion 12/13/2017  . Urothelial carcinoma of kidney, left ([place]) 12/07/2017  . Right inguinal hernia 07/17/2015  . Family history of colon cancer 07/17/2015  . Allergic rhinitis 11/12/2014  . Airway hyperreactivity 11/12/2014  . Atherosclerosis of coronary artery 11/12/2014  . Cheilitis 11/12/2014  . Narrowing of intervertebral disc space 11/12/2014  . Deflected nasal septum 11/12/2014  . HLD (hyperlipidemia) 11/12/2014  . BP (high blood pressure) 11/12/2014  . Adult hypothyroidism 11/12/2014  . Sleep apnea 11/12/2014  . Diabetes mellitus, type 2 ([place]) 11/12/2014  . Degenerative disc disease, lumbar 09/24/2012  . Furunculosis 04/23/2012   Phillips Grout PT, DPT, GCS   Valentina Alcoser 06/16/2019, 2:56 PM  [place] MAIN [facility] SERVICES [address] [place], [place], [address] Phone: [phone]   Fax:  [phone]  Name: Patrick Mcgee MRN: [id number] Date of Birth: March 24, 1946

## 2019-06-17 ENCOUNTER — Inpatient Hospital Stay (HOSPITAL_BASED_OUTPATIENT_CLINIC_OR_DEPARTMENT_OTHER): Payer: Medicare Other | Admitting: Oncology

## 2019-06-17 ENCOUNTER — Other Ambulatory Visit: Payer: Self-pay

## 2019-06-17 ENCOUNTER — Inpatient Hospital Stay: Payer: Medicare Other

## 2019-06-17 ENCOUNTER — Inpatient Hospital Stay: Payer: Medicare Other | Attending: Oncology

## 2019-06-17 ENCOUNTER — Inpatient Hospital Stay (HOSPITAL_BASED_OUTPATIENT_CLINIC_OR_DEPARTMENT_OTHER): Payer: Medicare Other | Admitting: Hospice and Palliative Medicine

## 2019-06-17 VITALS — BP 146/100 | HR 85 | Temp 97.7°F | Wt 151.0 lb

## 2019-06-17 DIAGNOSIS — C642 Malignant neoplasm of left kidney, except renal pelvis: Secondary | ICD-10-CM

## 2019-06-17 DIAGNOSIS — T451X5A Adverse effect of antineoplastic and immunosuppressive drugs, initial encounter: Secondary | ICD-10-CM | POA: Diagnosis not present

## 2019-06-17 DIAGNOSIS — C652 Malignant neoplasm of left renal pelvis: Secondary | ICD-10-CM | POA: Diagnosis present

## 2019-06-17 DIAGNOSIS — Z515 Encounter for palliative care: Secondary | ICD-10-CM

## 2019-06-17 DIAGNOSIS — I25119 Atherosclerotic heart disease of native coronary artery with unspecified angina pectoris: Secondary | ICD-10-CM

## 2019-06-17 DIAGNOSIS — Z5112 Encounter for antineoplastic immunotherapy: Secondary | ICD-10-CM | POA: Insufficient documentation

## 2019-06-17 DIAGNOSIS — G620 Drug-induced polyneuropathy: Secondary | ICD-10-CM

## 2019-06-17 DIAGNOSIS — Z5111 Encounter for antineoplastic chemotherapy: Secondary | ICD-10-CM

## 2019-06-17 DIAGNOSIS — C772 Secondary and unspecified malignant neoplasm of intra-abdominal lymph nodes: Secondary | ICD-10-CM | POA: Insufficient documentation

## 2019-06-17 DIAGNOSIS — D701 Agranulocytosis secondary to cancer chemotherapy: Secondary | ICD-10-CM

## 2019-06-17 LAB — CBC WITH DIFFERENTIAL/PLATELET
Abs Immature Granulocytes: 0.01 10*3/uL (ref 0.00–0.07)
Basophils Absolute: 0 10*3/uL (ref 0.0–0.1)
Basophils Relative: 1 %
Eosinophils Absolute: 0.2 10*3/uL (ref 0.0–0.5)
Eosinophils Relative: 4 %
HCT: 39 % (ref 39.0–52.0)
Hemoglobin: 12.2 g/dL — ABNORMAL LOW (ref 13.0–17.0)
Immature Granulocytes: 0 %
Lymphocytes Relative: 26 %
Lymphs Abs: 1.5 10*3/uL (ref 0.7–4.0)
MCH: 27.3 pg (ref 26.0–34.0)
MCHC: 31.3 g/dL (ref 30.0–36.0)
MCV: 87.2 fL (ref 80.0–100.0)
Monocytes Absolute: 0.6 10*3/uL (ref 0.1–1.0)
Monocytes Relative: 10 %
Neutro Abs: 3.3 10*3/uL (ref 1.7–7.7)
Neutrophils Relative %: 59 %
Platelets: 270 10*3/uL (ref 150–400)
RBC: 4.47 MIL/uL (ref 4.22–5.81)
RDW: 17.2 % — ABNORMAL HIGH (ref 11.5–15.5)
WBC: 5.6 10*3/uL (ref 4.0–10.5)
nRBC: 0 % (ref 0.0–0.2)

## 2019-06-17 LAB — COMPREHENSIVE METABOLIC PANEL
ALT: 15 U/L (ref 0–44)
AST: 17 U/L (ref 15–41)
Albumin: 3.9 g/dL (ref 3.5–5.0)
Alkaline Phosphatase: 83 U/L (ref 38–126)
Anion gap: 7 (ref 5–15)
BUN: 19 mg/dL (ref 8–23)
CO2: 28 mmol/L (ref 22–32)
Calcium: 9.8 mg/dL (ref 8.9–10.3)
Chloride: 103 mmol/L (ref 98–111)
Creatinine, Ser: 0.73 mg/dL (ref 0.61–1.24)
GFR calc Af Amer: >60 mL/min (ref >60)
GFR calc non Af Amer: >60 mL/min (ref >60)
Glucose, Bld: 163 mg/dL — ABNORMAL HIGH (ref 70–99)
Potassium: 3.9 mmol/L (ref 3.5–5.1)
Sodium: 138 mmol/L (ref 135–145)
Total Bilirubin: 0.9 mg/dL (ref 0.3–1.2)
Total Protein: 7.1 g/dL (ref 6.5–8.1)

## 2019-06-17 MED ORDER — SODIUM CHLORIDE 0.9 % IV SOLN
Freq: Once | INTRAVENOUS | Status: AC
  Administered 2019-06-17: 10:00:00 via INTRAVENOUS
  Filled 2019-06-17: qty 250

## 2019-06-17 MED ORDER — DEXAMETHASONE SODIUM PHOSPHATE 10 MG/ML IJ SOLN
10.0000 mg | Freq: Once | INTRAMUSCULAR | Status: DC
  Filled 2019-06-17: qty 1

## 2019-06-17 MED ORDER — PALONOSETRON HCL INJECTION 0.25 MG/5ML
0.2500 mg | Freq: Once | INTRAVENOUS | Status: AC
  Administered 2019-06-17: 0.25 mg via INTRAVENOUS
  Filled 2019-06-17: qty 5

## 2019-06-17 MED ORDER — SODIUM CHLORIDE 0.9 % IV SOLN
1.0000 mg/kg | Freq: Once | INTRAVENOUS | Status: AC
  Administered 2019-06-17: 70 mg via INTRAVENOUS
  Filled 2019-06-17: qty 6

## 2019-06-17 MED ORDER — HEPARIN SOD (PORK) LOCK FLUSH 100 UNIT/ML IV SOLN
500.0000 [IU] | Freq: Once | INTRAVENOUS | Status: AC
  Administered 2019-06-17: 500 [IU] via INTRAVENOUS
  Filled 2019-06-17: qty 5

## 2019-06-17 NOTE — Progress Notes (Signed)
[place]  [phone] [phone]   Name: Patrick Mcgee Date: 06/17/2019 MRN: [id number]  DOB: 18-Feb-1946  Patient Care Team: Jerrol Banana., MD as PCP - General (Family Medicine) Dingeldein, [name], MD as Consulting Physician (Ophthalmology) [name] as Consulting Physician (Internal Medicine) [name], MD as Consulting Physician (Pulmonary Disease) Sindy Guadeloupe, MD as Consulting Physician (Oncology)    REASON FOR CONSULTATION: Palliative Care consult requested for this 73 y.o. 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  PMH is also notable for mild interstitial lung disease, diabetes, and CAD status post previous stenting.  Patient was referred to palliative care to help address goals and manage ongoing symptoms.  SOCIAL HISTORY:     reports that he quit smoking about 40 years ago. His smoking use included cigars. He quit smokeless tobacco use about 30 years ago.  His smokeless tobacco use included chew. He reports previous alcohol use. He reports that he does not use drugs.   Patient is married and lives at home with his wife.  He has a daughter who lives nearby.  Patient formally worked at [employer] in [place]:  On file dated 12/28/2017  CODE STATUS:   PAST MEDICAL HISTORY: Past Medical History:  Diagnosis Date  . Acid reflux   . Anxiety   . Depression   . Diabetes mellitus without complication ([place])   . Hyperlipidemia   . Hypertension   . MRSA (methicillin resistant Staphylococcus aureus) infection 1992   history  . Myocardial infarction ([place]) 1995  . Sleep apnea    CPAP  . Urothelial cancer ([place]) 11/2017   Left Urothelial mass, chemo tx's    PAST SURGICAL HISTORY:  Past Surgical History:  Procedure Laterality Date  . CATARACT EXTRACTION  Left   . COLONOSCOPY  2010   [facility]  . COLONOSCOPY WITH PROPOFOL N/A 10/04/2016   Procedure: COLONOSCOPY WITH PROPOFOL;  Surgeon: [name], MD;  Location: [facility] ENDOSCOPY;  Service: Endoscopy;  Laterality: N/A;  . [place], 2000, 2001, 2014  . CYSTOSCOPY W/ RETROGRADES Bilateral 12/10/2017   Procedure: CYSTOSCOPY WITH RETROGRADE PYELOGRAM;  Surgeon: [name], MD;  Location: [facility] ORS;  Service: Urology;  Laterality: Bilateral;  . CYSTOSCOPY W/ RETROGRADES Left 12/09/2018   Procedure: CYSTOSCOPY WITH RETROGRADE PYELOGRAM;  Surgeon: [name], MD;  Location: [facility] ORS;  Service: Urology;  Laterality: Left;  . CYSTOSCOPY WITH BIOPSY Left 12/09/2018   Procedure: CYSTOSCOPY WITH URETERAL/RENAL PELVIC BIOPSY;  Surgeon: [name], MD;  Location: [facility] ORS;  Service: Urology;  Laterality: Left;  . CYSTOSCOPY WITH STENT PLACEMENT Left 12/10/2017   Procedure: CYSTOSCOPY WITH STENT PLACEMENT;  Surgeon: [name], MD;  Location: [facility] ORS;  Service: Urology;  Laterality: Left;  . CYSTOSCOPY WITH STENT PLACEMENT Left 12/09/2018   Procedure: CYSTOSCOPY WITH STENT PLACEMENT;  Surgeon: [name], MD;  Location: [facility] ORS;  Service: Urology;  Laterality: Left;  . CYSTOSCOPY WITH URETEROSCOPY Left 12/09/2018   Procedure: CYSTOSCOPY WITH URETEROSCOPY;  Surgeon: [name], MD;  Location: [facility] ORS;  Service: Urology;  Laterality: Left;  . EYE SURGERY Bilateral    cataract  . INGUINAL HERNIA REPAIR Right 08/09/2015   Procedure: HERNIA REPAIR INGUINAL ADULT;  Surgeon: [name], MD;  Location: [facility] ORS;  Service: General;  Laterality: Right;  . NASAL SINUS SURGERY    .  nuclear stress test    . PORTA CATH INSERTION N/A 12/19/2017   Procedure: PORTA CATH INSERTION;  Surgeon: [name], MD;  Location: [place] CV LAB;  Service: Cardiovascular;  Laterality: N/A;  . URETERAL BIOPSY Left 12/10/2017   Procedure: URETERAL & renal PELVIS  BIOPSY;  Surgeon: [name], MD;  Location: [facility] ORS;  Service: Urology;  Laterality: Left;  . URETEROSCOPY Left 12/10/2017   Procedure: URETEROSCOPY;  Surgeon: [name], MD;  Location: [facility] ORS;  Service: Urology;  Laterality: Left;    HEMATOLOGY/ONCOLOGY HISTORY:  Oncology History Overview Note  Patrick Mcgee is a 73 y.o. 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  He presented with abdominal pain and weight loss.  Abdomen and pelvic CT on 12/01/2017 revealed a suspicious enhancing, filling defect involving the lower pole collecting system of the left kidney with asymmetric hypoenhancement of the inferior pole cortex of left kidney noted. Findings were worrisome for urothelial neoplasm.  There was abdominal and pelvic adenopathy compatible with nodal metastasis.  There was a 2.6 cm right para celiac node, 1,6 cm left retroperitoneal node, 1.4 cm right retroperitoneal node, 2.1 cm right common iliac node and 1 cm left external iliac node.  There was aortic atherosclerosis, kidney cysts, and prostate gland enlargement.  Chest CT on 12/07/2017 revealed no evidence of metastatic disease.    He underwent left ureteroscopy with renal pelvis biopsy and left ureteral stent placement on 12/10/2017.  Findings included a large nodular, vasculartumor within the left lower pole collecting system completely obstructing the entire left lower pole moiety. Ureter, right kidney, and bladder was normal.  Pathology revealed small fragments of high-grade urothelial carcinoma with a small focus of invasion.  He has diabetes and coronary artery disease s/p MI in 1995.  He is s/p stent plaecment x 5-6. Colonoscopy in 2018 was negative.      Urothelial carcinoma of kidney, left ([place])  12/07/2017 Initial Diagnosis   Urothelial carcinoma of kidney, left ([place])   12/17/2017 - 05/27/2018 Chemotherapy   The patient had palonosetron (ALOXI) injection 0.25  mg, 0.25 mg, Intravenous,  Once, 8 of 10 cycles Administration: 0.25 mg (12/21/2017), 0.25 mg (01/14/2018), 0.25 mg (02/04/2018), 0.25 mg (02/25/2018), 0.25 mg (03/18/2018), 0.25 mg (04/08/2018), 0.25 mg (04/29/2018), 0.25 mg (05/27/2018) CARBOplatin (PARAPLATIN) 390 mg in sodium chloride 0.9 % 250 mL chemo infusion, 370 mg (100 % of original dose 370.4 mg), Intravenous,  Once, 8 of 10 cycles Dose modification:   (original dose 370.4 mg, Cycle 1, Reason: Provider Judgment),   (original dose 370.4 mg, Cycle 2, Reason: Other (see comments), Comment: Continue same dose as last cycle) Administration: 390 mg (12/21/2017), 390 mg (01/14/2018), 370 mg (02/04/2018), 370 mg (02/25/2018), 370 mg (03/18/2018), 370 mg (04/08/2018), 370 mg (04/29/2018), 370 mg (05/27/2018) gemcitabine (GEMZAR) 1,600 mg in sodium chloride 0.9 % 250 mL chemo infusion, 1,482 mg (80 % of original dose 1,000 mg/m2), Intravenous,  Once, 8 of 10 cycles Dose modification: 800 mg/m2 (original dose 1,000 mg/m2, Cycle 1, Reason: Provider Judgment), 800 mg/m2 (original dose 1,000 mg/m2, Cycle 2, Reason: Other (see comments), Comment: same dose as last cycle), 850 mg/m2 (original dose 1,000 mg/m2, Cycle 3, Reason: Provider Judgment, Comment: prior dose tolerated), 700 mg/m2 (original dose 1,000 mg/m2, Cycle 7, Reason: Provider Judgment), 750 mg/m2 (original dose 1,000 mg/m2, Cycle 7, Reason: Provider Judgment) Administration: 1,600 mg (12/21/2017), 1,600 mg (12/31/2017), 1,600 mg (01/14/2018), 1,600 mg (01/21/2018), 1,600 mg (02/04/2018), 1,600 mg (02/11/2018),  1,600 mg (02/25/2018), 1,600 mg (03/18/2018), 1,600 mg (04/08/2018), 1,600 mg (04/15/2018), 1,600 mg (04/29/2018), 1,400 mg (05/06/2018), 1,400 mg (05/27/2018) ondansetron (ZOFRAN) 8 mg in sodium chloride 0.9 % 50 mL IVPB, , Intravenous,  Once, 5 of 7 cycles Administration:  (01/21/2018)  for chemotherapy treatment.    01/14/2019 -  Chemotherapy   The patient had palonosetron (ALOXI) injection 0.25 mg, 0.25 mg,  Intravenous,  Once, 6 of 6 cycles Administration: 0.25 mg (01/14/2019), 0.25 mg (02/11/2019), 0.25 mg (01/21/2019), 0.25 mg (01/28/2019), 0.25 mg (02/25/2019), 0.25 mg (03/11/2019), 0.25 mg (03/18/2019), 0.25 mg (04/22/2019), 0.25 mg (03/25/2019), 0.25 mg (04/29/2019), 0.25 mg (05/20/2019), 0.25 mg (05/28/2019) enfortumab vedotin-ejfv (PADCEV) 70 mg in sodium chloride 0.9 % 50 mL (1.2281 mg/mL) chemo infusion, 1 mg/kg = 70 mg (100 % of original dose 1 mg/kg), Intravenous,  Once, 6 of 6 cycles Dose modification: 1 mg/kg (original dose 1 mg/kg, Cycle 1, Reason: Provider Judgment) Administration: 70 mg (01/14/2019), 70 mg (02/11/2019), 70 mg (01/21/2019), 70 mg (01/28/2019), 70 mg (02/25/2019), 70 mg (03/11/2019), 70 mg (03/18/2019), 70 mg (04/22/2019), 70 mg (03/25/2019), 70 mg (04/29/2019), 70 mg (05/20/2019), 70 mg (05/28/2019)  for chemotherapy treatment.    Urothelial cancer ([place])  12/18/2017 Initial Diagnosis   Urothelial cancer ([place])   06/17/2018 - 01/14/2019 Chemotherapy   The patient had atezolizumab (TECENTRIQ) 1,200 mg in sodium chloride 0.9 % 250 mL chemo infusion, 1,200 mg, Intravenous, Once, 10 of 10 cycles Administration: 1,200 mg (06/17/2018), 1,200 mg (07/05/2018), 1,200 mg (07/29/2018), 1,200 mg (08/19/2018), 1,200 mg (09/30/2018), 1,200 mg (09/09/2018), 1,200 mg (10/21/2018), 1,200 mg (11/11/2018), 1,200 mg (12/03/2018), 1,200 mg (12/24/2018)  for chemotherapy treatment.      ALLERGIES:  is allergic to sulfa antibiotics; ace inhibitors; invokana [canagliflozin]; and penicillins.  MEDICATIONS:  Current Outpatient Medications  Medication Sig Dispense Refill  . aspirin EC 81 MG tablet Take 81 mg by mouth every evening.     Marland Kitchen atorvastatin (LIPITOR) 40 MG tablet TAKE 1 TABLET BY MOUTH AT BEDTIME 90 tablet 4  . Cholecalciferol (VITAMIN D3) 75 MCG (3000 UT) TABS Take by mouth.    . clobetasol cream (TEMOVATE) 9.67 % Apply 1 application topically as needed (for skin rash). (Patient not taking: Reported on 06/16/2019) 30 g  1  . fluticasone (FLONASE) 50 MCG/ACT nasal spray Use 2 spray(s) in each nostril once daily (Patient not taking: No sig reported) 48 g 3  . gabapentin (NEURONTIN) 300 MG capsule Take 1 capsule (300 mg total) by mouth 3 (three) times daily. 270 capsule 0  . glucose blood (CONTOUR NEXT TEST) test strip Check blood sugars 3 times daily. 300 each 11  . HYDROcodone-acetaminophen (NORCO/VICODIN) 5-325 MG tablet Take 1 tablet by mouth 1 day or 1 dose.    . hydrOXYzine (ATARAX/VISTARIL) 25 MG tablet Take 1 tablet (25 mg total) by mouth every 8 (eight) hours as needed for itching. (Patient not taking: Reported on 06/16/2019) 30 tablet 3  . Ivermectin (SOOLANTRA) 1 % CREA Apply 1 application topically daily as needed (rosacea). To face    . JARDIANCE 10 MG TABS tablet Take 1 tablet by mouth once daily 30 tablet 0  . lidocaine-prilocaine (EMLA) cream Apply 1 application topically as needed (port access). 1 g 3  . loratadine (CLARITIN) 10 MG tablet Take 10 mg by mouth daily as needed for allergies. [employer] brand allergy relief    . losartan (COZAAR) 100 MG tablet Take 1 tablet by mouth once daily 90 tablet 3  . magic mouthwash w/lidocaine  SOLN Take 5 mLs by mouth 4 (four) times daily as needed for mouth pain. 480 mL 3  . magnesium hydroxide (MILK OF MAGNESIA) 400 MG/5ML suspension Take 15 mLs by mouth daily as needed for mild constipation.    . metFORMIN (GLUCOPHAGE) 1000 MG tablet TAKE 1 TABLET BY MOUTH TWICE DAILY WITH MEALS 180 tablet 0  . nystatin cream (MYCOSTATIN) Apply topically 2 (two) times daily. (Patient not taking: Reported on 06/16/2019) 15 g 1  . OLANZapine (ZYPREXA) 10 MG tablet Take 1 tablet (10 mg total) by mouth at bedtime. (Patient not taking: Reported on 06/16/2019) 30 tablet 1  . OVER THE COUNTER MEDICATION Apply 1 application topically daily as needed (pain). Outback topical pain relief    . oxybutynin (DITROPAN) 5 MG tablet Take 1 tablet by mouth 1 day or 1 dose.    . sertraline (ZOLOFT)  100 MG tablet Take 1 tablet (100 mg total) by mouth daily. 90 tablet 3  . vitamin B-12 (CYANOCOBALAMIN) 1000 MCG tablet Take by mouth daily. Unsure dose    . vitamin C (ASCORBIC ACID) 500 MG tablet Take 1,000-1,500 mg by mouth daily as needed (immune support).    Marland Kitchen VITAMIN D PO Take 1 tablet by mouth 1 day or 1 dose.     No current facility-administered medications for this visit.    Facility-Administered Medications Ordered in Other Visits  Medication Dose Route Frequency Provider Last Rate Last Dose  . enfortumab vedotin-ejfv (PADCEV) 70 mg in sodium chloride 0.9 % 50 mL (1.2281 mg/mL) chemo infusion  1 mg/kg (Treatment Plan Recorded) Intravenous Once Sindy Guadeloupe, MD 114 mL/hr at 06/17/19 1032 70 mg at 06/17/19 1032  . Influenza vac split quadrivalent PF (FLUZONE HIGH-DOSE) injection 0.5 mL  0.5 mL Intramuscular Once Karen Kitchens, NP        VITAL SIGNS: There were no vitals taken for this visit. There were no vitals filed for this visit.  Estimated body mass index is 22.96 kg/m as calculated from the following:   Height as of 04/22/19: 5' 8" (1.727 m).   Weight as of an earlier encounter on 06/17/19: 151 lb (68.5 kg).  LABS: CBC:    Component Value Date/Time   WBC 5.6 06/17/2019 0851   HGB 12.2 (L) 06/17/2019 0851   HGB 13.7 03/15/2017 1042   HCT 39.0 06/17/2019 0851   HCT 39.7 03/15/2017 1042   PLT 270 06/17/2019 0851   PLT 270 03/15/2017 1042   MCV 87.2 06/17/2019 0851   MCV 89 03/15/2017 1042   MCV 89 02/03/2013 2223   NEUTROABS 3.3 06/17/2019 0851   NEUTROABS 4.6 03/15/2017 1042   LYMPHSABS 1.5 06/17/2019 0851   LYMPHSABS 1.2 03/15/2017 1042   MONOABS 0.6 06/17/2019 0851   EOSABS 0.2 06/17/2019 0851   EOSABS 0.3 03/15/2017 1042   BASOSABS 0.0 06/17/2019 0851   BASOSABS 0.0 03/15/2017 1042   Comprehensive Metabolic Panel:    Component Value Date/Time   NA 138 06/17/2019 0851   NA 137 03/15/2017 1042   NA 137 02/03/2013 2223   K 3.9 06/17/2019 0851   K 4.0  02/03/2013 2223   CL 103 06/17/2019 0851   CL 106 02/03/2013 2223   CO2 28 06/17/2019 0851   CO2 28 02/03/2013 2223   BUN 19 06/17/2019 0851   BUN 9 03/15/2017 1042   BUN 11 02/03/2013 2223   CREATININE 0.73 06/17/2019 0851   CREATININE 0.69 02/03/2013 2223   GLUCOSE 163 (H) 06/17/2019 0851   GLUCOSE  158 (H) 02/03/2013 2223   CALCIUM 9.8 06/17/2019 0851   CALCIUM 9.3 02/03/2013 2223   AST 17 06/17/2019 0851   AST 26 02/03/2013 2223   ALT 15 06/17/2019 0851   ALT 30 02/03/2013 2223   ALKPHOS 83 06/17/2019 0851   ALKPHOS 121 02/03/2013 2223   BILITOT 0.9 06/17/2019 0851   BILITOT 1.5 (H) 03/15/2017 1042   BILITOT 1.1 (H) 02/03/2013 2223   PROT 7.1 06/17/2019 0851   PROT 6.9 03/15/2017 1042   PROT 7.5 02/03/2013 2223   ALBUMIN 3.9 06/17/2019 0851   ALBUMIN 4.3 03/15/2017 1042   ALBUMIN 3.3 (L) 02/03/2013 2223    RADIOGRAPHIC STUDIES: No results found.  PERFORMANCE STATUS (ECOG) : 2 - Symptomatic, <50% confined to bed  Review of Systems Unless otherwise noted, a complete review of systems is negative.  Physical Exam General: NAD, frail appearing, thin Pulmonary: Unlabored Extremities: no edema, no joint deformities Skin: no rashes Neurological: Weakness but otherwise nonfocal  IMPRESSION: Routine follow-up visit with patient.  He was seen in the infusion area.  Patient reports that he is doing well.  He denies any acute changes or concerns.  He denies any distressing symptoms.  He says that he has had some time recently to spend with family and has an acceptable quality of life at present.  Patient reports that his appetite is improved.  His weight has increased slightly from 149 pounds to 151 pounds over the last 3 weeks.  Appetite stimulants have been discussed but patient wants to hold off for now.  Plan is for repeat CT scan in a few weeks with subsequent treatment decisions to follow.  Patient says that if there is evidence of disease progression, then he would  likely opt to stop treatments and "let God take over".  Patient says that he is not afraid of what over the future may hold.  Patient says that he has previously met with an attorney and has ACP documents established at home.  We did discuss CODE STATUS previously.  Will complete MOST form during a future visit when he can again be seen privately in an exam room.   PLAN: -Continue current scope of treatment -MOST form previously reviewed -RTC in 1 month   Patient expressed understanding and was in agreement with this plan. He also understands that He can call the clinic at any time with any questions, concerns, or complaints.     Time Total: 20 minutes  Visit consisted of counseling and education dealing with the complex and emotionally intense issues of symptom management and palliative care in the setting of serious and potentially life-threatening illness.Greater than 50%  of this time was spent counseling and coordinating care related to the above assessment and plan.  Signed by: Altha Harm, PhD, NP-C [phone] (Work Cell)

## 2019-06-18 ENCOUNTER — Ambulatory Visit: Payer: Medicare Other

## 2019-06-18 DIAGNOSIS — R2681 Unsteadiness on feet: Secondary | ICD-10-CM

## 2019-06-18 DIAGNOSIS — M6281 Muscle weakness (generalized): Secondary | ICD-10-CM

## 2019-06-18 NOTE — Progress Notes (Signed)
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 [name] so far for his metastatic urothelial carcinoma. This was originally diagnosed in May 2019. He was noted to have a filling defect in the lower pole collecting system of the left kidney along with para-aortic and retroperitoneal adenopathy concerning for metastatic disease. He underwent left ureteroscopy and renal pelvis biopsy which revealed small fragments of high-grade urothelial carcinoma with small focus of invasion. He was started on carboplatin and gemcitabine chemotherapy in June 2019 and was continued on 05/27/2018 for 8 cycles. He tolerated chemotherapy well except for chemo-induced anemia for which she has been getting Procrit every 2 weeks. He did have response to his disease based on scans in August 2019. However repeat scan on 05/31/2018 showed increase in the size of the primary tumor from 1.2 to 1.9 cm and increase in left para-aortic adenopathy from 0.9 to 1.3 cm. Periportal adenopathy was stable at 1.4 cm. Second line immunotherapy was recommended. His initial biopsy specimen did not have enough sample to undergo FGFR mutation testing. He also has mild interstitial lung disease for  which he sees pulmonary but he is not on home oxygen. He has B12 deficiency for which he is on oral B12. Also has diabetes and coronary artery disease.Tecentriq started on 06/17/2018.Patient noted to have disease progression in his lymph nodes in May 2020. Repeat biopsy showed metastatic urothelial carcinoma which did not have aFGFR mutation. He has been started on third line Padcev  Interval history-neuropathy is stable.  He reports a better quality of life with 2-week on 2-week off regimen.  He has chronic fatigue.  Reports poor appetite  ECOG PS- 1 Pain scale- 0  Review of systems- Review of Systems  Constitutional: Positive for malaise/fatigue. Negative for chills, fever and weight loss.  HENT: Negative for congestion, ear discharge and nosebleeds.   Eyes: Negative for blurred vision.  Respiratory: Negative for cough, hemoptysis, sputum production, shortness of breath and wheezing.   Cardiovascular: Negative for chest pain, palpitations, orthopnea and claudication.  Gastrointestinal: Negative for abdominal pain, blood in stool, constipation, diarrhea, heartburn, melena, nausea and vomiting.  Genitourinary: Negative for dysuria, flank pain, frequency, hematuria and urgency.  Musculoskeletal: Negative for back pain, joint pain and myalgias.  Skin: Negative for rash.  Neurological: Positive for sensory change (Peripheral neuropathy). Negative for dizziness, tingling, focal weakness, seizures, weakness and headaches.  Endo/Heme/Allergies: Does not bruise/bleed easily.  Psychiatric/Behavioral: Negative for depression and suicidal ideas. The patient does not have insomnia.      Allergies  Allergen Reactions   Sulfa Antibiotics Other (See Comments)    Joint pain   Ace Inhibitors Cough   Invokana [Canagliflozin] Other (See Comments)    Leg pain   Penicillins Rash    Did it involve swelling of the face/tongue/throat, SOB, or low BP? No Did it involve sudden or severe rash/hives,  skin peeling, or any reaction on the inside of your mouth or nose? Yes Did you need to seek medical attention at a hospital or doctor's office? Yes When did it last happen?childhood allergy If all above answers are "NO", may proceed with cephalosporin use.      Past Medical History:  Diagnosis Date   Acid reflux    Anxiety    Depression    Diabetes mellitus without complication ([place])    Hyperlipidemia    Hypertension    MRSA (methicillin resistant Staphylococcus aureus) infection 1992   history   Myocardial infarction ([place]) 1995   Sleep apnea    CPAP   Urothelial cancer ([place]) 11/2017   Left Urothelial mass, chemo tx's     Past Surgical History:  Procedure Laterality Date   CATARACT EXTRACTION Left    COLONOSCOPY  2010   [facility]   COLONOSCOPY WITH PROPOFOL N/A 10/04/2016   Procedure: COLONOSCOPY WITH PROPOFOL;  Surgeon: [name], MD;  Location: [place];  Service: Endoscopy;  Laterality: N/A;   [place], 2000, 2001, 2014   CYSTOSCOPY W/ RETROGRADES Bilateral 12/10/2017   Procedure: CYSTOSCOPY WITH RETROGRADE PYELOGRAM;  Surgeon: [name], MD;  Location: [facility];  Service: Urology;  Laterality: Bilateral;   CYSTOSCOPY W/ RETROGRADES Left 12/09/2018   Procedure: CYSTOSCOPY WITH RETROGRADE PYELOGRAM;  Surgeon: [name], MD;  Location: [facility];  Service: Urology;  Laterality: Left;   CYSTOSCOPY WITH BIOPSY Left 12/09/2018   Procedure: CYSTOSCOPY WITH URETERAL/RENAL PELVIC BIOPSY;  Surgeon: [name], MD;  Location: [facility];  Service: Urology;  Laterality: Left;   CYSTOSCOPY WITH STENT PLACEMENT Left 12/10/2017   Procedure: CYSTOSCOPY WITH STENT PLACEMENT;  Surgeon: [name], MD;  Location: [facility];  Service: Urology;  Laterality: Left;   CYSTOSCOPY WITH STENT PLACEMENT Left 12/09/2018   Procedure: CYSTOSCOPY WITH STENT PLACEMENT;  Surgeon: [name], MD;  Location: [facility];  Service: Urology;  Laterality: Left;   CYSTOSCOPY WITH URETEROSCOPY Left 12/09/2018   Procedure: CYSTOSCOPY WITH URETEROSCOPY;  Surgeon: [name], MD;  Location: [facility];  Service: Urology;  Laterality: Left;   EYE SURGERY Bilateral    cataract   INGUINAL HERNIA REPAIR Right 08/09/2015   Procedure: HERNIA REPAIR INGUINAL ADULT;  Surgeon: [name], MD;  Location: [facility];  Service: General;  Laterality: Right;   NASAL SINUS SURGERY     nuclear stress test     PORTA CATH INSERTION N/A 12/19/2017   Procedure: PORTA CATH INSERTION;  Surgeon: [name], MD;  Location: [place] [facility];  Service: Cardiovascular;  Laterality: N/A;   URETERAL BIOPSY Left 12/10/2017   Procedure: URETERAL & renal PELVIS BIOPSY;  Surgeon: [name], MD;  Location: [facility];  Service: Urology;  Laterality: Left;   URETEROSCOPY Left 12/10/2017   Procedure: URETEROSCOPY;  Surgeon: [name], MD;  Location: [facility];  Service: Urology;  Laterality: Left;    Social History   Socioeconomic History   Marital status: Married    Spouse name: [name]   Number of children: 1   Years of education: Not on file   Highest education level: Associate degree: occupational, Hotel manager, or vocational program  Occupational History   Occupation: retired  Scientist, product/process development strain: Not hard at [employer]    Worry: Never true    Inability: Never true   Transportation needs    Medical: No    Non-medical: No  Tobacco Use   Smoking status: Former Smoker    Types: Cigars    Quit date: 10/09/1978    Years since quitting: 40.7   Smokeless tobacco: Former Systems developer    Types: Chew    Quit date: 12/04/1988   Tobacco comment: on occasion  Substance and Sexual Activity   Alcohol use: Not Currently   Drug use: No   Sexual activity: Not Currently  Lifestyle   Physical activity    Days per week: 0 days    Minutes per session: 0 min   Stress: Not at all    Relationships   Social connections    Talks on phone: Patient refused    Gets together: Patient refused    Attends religious service: Patient refused    Active member of club or organization: Patient refused    Attends meetings of clubs or organizations: Patient refused    Relationship status: Patient refused   Intimate partner violence    Fear of current or ex partner: Patient refused    Emotionally abused: Patient refused    Physically abused: Patient refused    Forced sexual activity: Patient refused  Other Topics Concern   Not on file  Social History Narrative   Not on file    Family History  Problem Relation Age of Onset   Heart disease Mother    Cancer Father        Lung and colon cancer   Heart disease Father    Emphysema Maternal Grandfather    Tuberculosis Maternal Grandmother      Current Outpatient Medications:    aspirin EC 81 MG tablet, Take 81 mg by mouth every evening. , Disp: , Rfl:    atorvastatin (LIPITOR) 40 MG tablet, TAKE 1 TABLET BY MOUTH AT BEDTIME, Disp: 90 tablet, Rfl: 4   Cholecalciferol (VITAMIN D3) 75 MCG (3000 UT) TABS, Take by mouth., Disp: , Rfl:    gabapentin (NEURONTIN) 300 MG capsule, Take 1 capsule (300 mg total) by mouth 3 (three) times daily., Disp: 270 capsule, Rfl: 0   glucose blood (CONTOUR NEXT TEST) test strip, Check blood sugars 3 times daily., Disp: 300 each, Rfl: 11   HYDROcodone-acetaminophen (NORCO/VICODIN) 5-325 MG tablet, Take 1 tablet by mouth 1 day or 1 dose., Disp: , Rfl:    Ivermectin (SOOLANTRA) 1 % CREA, Apply 1 application topically daily as needed (rosacea). To face, Disp: , Rfl:    JARDIANCE 10 MG TABS tablet, Take 1 tablet by mouth once daily, Disp: 30 tablet, Rfl: 0   lidocaine-prilocaine (EMLA) cream, Apply 1 application topically as needed (port access)., Disp: 1 g, Rfl: 3   losartan (COZAAR) 100 MG tablet, Take 1 tablet by mouth once daily, Disp: 90 tablet, Rfl: 3   magic mouthwash  w/lidocaine SOLN, Take 5 mLs by mouth 4 (four) times daily as needed for mouth pain., Disp: 480 mL, Rfl: 3   metFORMIN (GLUCOPHAGE) 1000 MG tablet, TAKE 1 TABLET BY MOUTH TWICE DAILY WITH MEALS, Disp: 180 tablet, Rfl: 0   oxybutynin (DITROPAN) 5 MG tablet, Take 1 tablet by mouth 1 day or 1 dose., Disp: , Rfl:    sertraline (ZOLOFT) 100 MG tablet, Take 1 tablet (100 mg total) by mouth daily., Disp: 90 tablet, Rfl: 3   vitamin B-12 (CYANOCOBALAMIN) 1000 MCG tablet, Take by mouth daily. Unsure dose, Disp: , Rfl:    vitamin C (ASCORBIC ACID) 500 MG tablet, Take 1,000-1,500 mg by mouth daily as needed (immune support)., Disp: , Rfl:  VITAMIN D PO, Take 1 tablet by mouth 1 day or 1 dose., Disp: , Rfl:    clobetasol cream (TEMOVATE) [id number] %, Apply 1 application topically as needed (for skin rash). (Patient not taking: Reported on 06/16/2019), Disp: 30 g, Rfl: 1   fluticasone (FLONASE) 50 MCG/ACT nasal spray, Use 2 spray(s) in each nostril once daily (Patient not taking: No sig reported), Disp: 48 g, Rfl: 3   hydrOXYzine (ATARAX/VISTARIL) 25 MG tablet, Take 1 tablet (25 mg total) by mouth every 8 (eight) hours as needed for itching. (Patient not taking: Reported on 06/16/2019), Disp: 30 tablet, Rfl: 3   loratadine (CLARITIN) 10 MG tablet, Take 10 mg by mouth daily as needed for allergies. [employer] brand allergy relief, Disp: , Rfl:    magnesium hydroxide (MILK OF MAGNESIA) 400 MG/5ML suspension, Take 15 mLs by mouth daily as needed for mild constipation., Disp: , Rfl:    nystatin cream (MYCOSTATIN), Apply topically 2 (two) times daily. (Patient not taking: Reported on 06/16/2019), Disp: 15 g, Rfl: 1   OLANZapine (ZYPREXA) 10 MG tablet, Take 1 tablet (10 mg total) by mouth at bedtime. (Patient not taking: Reported on 06/16/2019), Disp: 30 tablet, Rfl: 1   OVER THE COUNTER MEDICATION, Apply 1 application topically daily as needed (pain). Outback topical pain relief, Disp: , Rfl:  No current  facility-administered medications for this visit.   Facility-Administered Medications Ordered in Other Visits:    Influenza vac split quadrivalent PF (FLUZONE HIGH-DOSE) injection 0.5 mL, 0.5 mL, Intramuscular, Once, Karen Kitchens, NP  Physical exam:  Vitals:   06/17/19 0903  BP: (!) 146/100  Pulse: 85  Temp: 97.7 F (36.5 C)  TempSrc: Tympanic  SpO2: 98%  Weight: 151 lb (68.5 kg)   Physical Exam Constitutional:      General: He is not in acute distress. HENT:     Head: Normocephalic and atraumatic.  Eyes:     Pupils: Pupils are equal, round, and reactive to light.  Neck:     Musculoskeletal: Normal range of motion.  Cardiovascular:     Rate and Rhythm: Normal rate and regular rhythm.     Heart sounds: Normal heart sounds.  Pulmonary:     Effort: Pulmonary effort is normal.     Breath sounds: Normal breath sounds.  Abdominal:     General: Bowel sounds are normal.     Palpations: Abdomen is soft.  Skin:    General: Skin is warm and dry.  Neurological:     Mental Status: He is alert and oriented to person, place, and time.      CMP Latest Ref Rng & Units 06/17/2019  Glucose 70 - 99 mg/dL 163(H)  BUN 8 - 23 mg/dL 19  Creatinine 0.61 - 1.24 mg/dL 0.73  Sodium 135 - 145 mmol/L 138  Potassium 3.5 - 5.1 mmol/L 3.9  Chloride 98 - 111 mmol/L 103  CO2 22 - 32 mmol/L 28  Calcium 8.9 - 10.3 mg/dL 9.8  Total Protein 6.5 - 8.1 g/dL 7.1  Total Bilirubin 0.3 - 1.2 mg/dL 0.9  Alkaline Phos 38 - 126 U/L 83  AST 15 - 41 U/L 17  ALT 0 - 44 U/L 15   CBC Latest Ref Rng & Units 06/17/2019  WBC 4.0 - 10.5 K/uL 5.6  Hemoglobin 13.0 - 17.0 g/dL 12.2(L)  Hematocrit 39.0 - 52.0 % 39.0  Platelets 150 - 400 K/uL 270    Assessment and plan- Patient is a 73 y.o. male with history of metastatic  urothelial carcinoma with metastases to the lymph nodes.He had disease progression on carboplatin/gemcitabine as well as second line Tecentriq.He is here for on treatment assessment prior to  cycle 7-day 1 of Padcev  Counts okay to proceed with cycle 7 day 1 of Padcev today.  He will directly proceed for cycle 7-day 8 next week I will see him back in 4 weeks for cycle 8-day 1.  I will plan to get repeat CT chest abdomen pelvis with contrast prior to his next cycle of treatment.  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  He has baseline right foot drop which is improving.  He is currently on gabapentin which is tolerating well.  Appetite loss: Patient did not want to try Zyprexa due to concern for side effects.  We discussed trial of Megace.  Patient would like to hold off until he sees me the next time   Visit Diagnosis 1. Urothelial carcinoma of kidney, left ([place])   2. Encounter for antineoplastic chemotherapy   3. Chemotherapy-induced peripheral neuropathy ([place])      Dr. Randa Evens, MD, MPH [facility] [id number] 06/18/2019 12:02 PM

## 2019-06-18 NOTE — Therapy (Signed)
[place] MAIN [facility] SERVICES [address] [place], [place], [address] Phone: [phone]   Fax:  [phone]  Physical Therapy Treatment  Patient Details  Name: Patrick Mcgee MRN: [id number] Date of Birth: April 19, 1946 Referring Provider (PT): Dr. Rosanna Randy   Encounter Date: 06/18/2019  PT End of Session - 06/18/19 1021    Visit Number  5    Number of Visits  25    Date for PT Re-Evaluation  08/25/19    PT Start Time  1018    PT Stop Time  1100    PT Time Calculation (min)  42 min    Equipment Utilized During Treatment  Gait belt    Activity Tolerance  Patient tolerated treatment well    Behavior During Therapy  [facility] for tasks assessed/performed       Past Medical History:  Diagnosis Date  . Acid reflux   . Anxiety   . Depression   . Diabetes mellitus without complication ([place])   . Hyperlipidemia   . Hypertension   . MRSA (methicillin resistant Staphylococcus aureus) infection 1992   history  . Myocardial infarction ([place]) 1995  . Sleep apnea    CPAP  . Urothelial cancer ([place]) 11/2017   Left Urothelial mass, chemo tx's    Past Surgical History:  Procedure Laterality Date  . CATARACT EXTRACTION Left   . COLONOSCOPY  2010   [facility]  . COLONOSCOPY WITH PROPOFOL N/A 10/04/2016   Procedure: COLONOSCOPY WITH PROPOFOL;  Surgeon: [name], MD;  Location: [facility] ENDOSCOPY;  Service: Endoscopy;  Laterality: N/A;  . [place], 2000, 2001, 2014  . CYSTOSCOPY W/ RETROGRADES Bilateral 12/10/2017   Procedure: CYSTOSCOPY WITH RETROGRADE PYELOGRAM;  Surgeon: [name], MD;  Location: [facility] ORS;  Service: Urology;  Laterality: Bilateral;  . CYSTOSCOPY W/ RETROGRADES Left 12/09/2018   Procedure: CYSTOSCOPY WITH RETROGRADE PYELOGRAM;  Surgeon: [name], MD;  Location: [facility] ORS;  Service: Urology;  Laterality: Left;  . CYSTOSCOPY WITH BIOPSY Left 12/09/2018   Procedure: CYSTOSCOPY WITH  URETERAL/RENAL PELVIC BIOPSY;  Surgeon: [name], MD;  Location: [facility] ORS;  Service: Urology;  Laterality: Left;  . CYSTOSCOPY WITH STENT PLACEMENT Left 12/10/2017   Procedure: CYSTOSCOPY WITH STENT PLACEMENT;  Surgeon: [name], MD;  Location: [facility] ORS;  Service: Urology;  Laterality: Left;  . CYSTOSCOPY WITH STENT PLACEMENT Left 12/09/2018   Procedure: CYSTOSCOPY WITH STENT PLACEMENT;  Surgeon: [name], MD;  Location: [facility] ORS;  Service: Urology;  Laterality: Left;  . CYSTOSCOPY WITH URETEROSCOPY Left 12/09/2018   Procedure: CYSTOSCOPY WITH URETEROSCOPY;  Surgeon: [name], MD;  Location: [facility] ORS;  Service: Urology;  Laterality: Left;  . EYE SURGERY Bilateral    cataract  . INGUINAL HERNIA REPAIR Right 08/09/2015   Procedure: HERNIA REPAIR INGUINAL ADULT;  Surgeon: [name], MD;  Location: [facility] ORS;  Service: General;  Laterality: Right;  . NASAL SINUS SURGERY    . nuclear stress test    . PORTA CATH INSERTION N/A 12/19/2017   Procedure: PORTA CATH INSERTION;  Surgeon: [name], MD;  Location: [facility] CV LAB;  Service: Cardiovascular;  Laterality: N/A;  . URETERAL BIOPSY Left 12/10/2017   Procedure: URETERAL & renal PELVIS BIOPSY;  Surgeon: [name], MD;  Location: [facility] ORS;  Service: Urology;  Laterality: Left;  . URETEROSCOPY Left 12/10/2017   Procedure: URETEROSCOPY;  Surgeon: [name], MD;  Location: [facility] ORS;  Service: Urology;  Laterality: Left;  There were no vitals filed for this visit.  Subjective Assessment - 06/18/19 1021    Subjective  Pt doing well today. No falls since last session. Denies pain. He reports he is somewhat fatigued today following his chemotherapy treatment yesterday.    Pertinent History  Part of history borrowed from oncology note and verified with patient: Patient is a 73 year old male who was referred for foot drop. He started with Dr. [name] so far for his metastatic urothelial carcinoma.  This was  originally diagnosed in May 2019. He was started on chemotherapy in June 2019 and was continued on 05/27/2018 for 8 cycles.  He tolerated chemotherapy well except for chemo-induced anemia for which she has been getting Procrit every 2 weeks.  He did have response to his disease based on scans in August 2019.  However repeat scan on 05/31/2018 showed increase in the size of the primary tumor from 1.2 to 1.9 cm and increase in left para-aortic adenopathy from 0.9 to 1.3 cm.  Second line immunotherapy was recommended.  His initial biopsy specimen did not have enough sample to undergo FGFR mutation testing. He also has mild interstitial lung disease for which he sees pulmonary but he is not on home oxygen.  He has B12 deficiency for which he is on oral B12.  Also has diabetes and coronary artery disease. Patient noted to have disease progression in his lymph nodes in May 2020.  Repeat biopsy showed metastatic urothelial carcinoma which did not have a FGFR mutation.  He has been started on third line Padcev. There is still times when he feels fatigued but he is having more good days. He reports poor appetite. He was working with physical therapy on his foot drop, RLE weakness, and unsteadiness. He states that he gets considerable numbness for the 7-8 days following his chemotherapy treatment. He states that he is a diabetic and did have some numbness prior to starting chemotherapy. He states that he has a history of a low back disc bulge with RLE radicular symptoms. He used to get spinal injections but reports he hasn't had any injections in the last 10 years.    Currently in Pain?  No/denies         TREATMENT   Ther-ex  Xride warm-up with UE x 5 minutes, 45s intervals alternating between L3 and L6, fatigue and challenge monitored throughout and seat height/inclination adjusted; Hooklying bridges, toes up, 3s hold at top 2 x 10; Hooklying SLR 2# ankle weight 2 x 10 bilateral; Hooklying clams with manual  resistance 2 x 10 Hooklying adductor squeeze with manual resistance 2 x 10; Supine R ankle dorsiflexion with gentle manual resistance 2 x 10; Sidelying hip SLR abduction 2 x 10 bilateral; Sidelying hip SLR adduction 2 x 10 bilateral; TRX squats 2 x 15;  Neuromuscular Re-education Matrix resisted gait 12.5# forward, retro, R lateral, and L lateral x 3 each direction; Tandem gait in // bars without UE support x 4 lengths; Airex balance WBOS eyes closed x 30s; Airex balance NBOS eyes closed x 30s;   Pt educated throughout session about proper posture and technique with exercises. Improved exercise technique, movement at target joints, use of target muscles after min to mod verbal, visual, tactile cues.    Pt demonstrates excellent motivation today. He reports some fatigue from chemotherapy treatment yesterday so added additional hooklying exercises into session today and avoided leg press. He is able to perform some resisted gait today as well as TRX squats. Pt requires cues  for proper form with squats to avoid excessive anterior knee pressure. Pt encouraged to continue HEP. He will benefit from PT services to address deficits in strength, balance, and mobility in order to return to full function at home.                         PT Short Term Goals - 06/02/19 1242      PT SHORT TERM GOAL #1   Title  Pt will be independent with HEP in order to improve strength and balance in order to decrease fall risk and improve function at home    Time  6    Period  Weeks    Status  New    Target Date  07/14/19        PT Long Term Goals - 06/02/19 1243      PT LONG TERM GOAL #1   Title  Pt will improve BERG by at least 3 points in order to demonstrate clinically significant improvement in balance.    Baseline  06/02/19: 48/56    Time  12    Period  Weeks    Status  New    Target Date  08/25/19      PT LONG TERM GOAL #2   Title  Pt will improve ABC by at least 13%  in order to demonstrate clinically significant improvement in balance confidence.    Baseline  06/02/19: 64.4%    Time  12    Period  Weeks    Status  New    Target Date  08/25/19      PT LONG TERM GOAL #3   Title  Pt will increase 30s Sit to Stand Test to greater than or equal to 14 repetitions in order to demonstrate clinically significant improvement in LE endurance and decrease in fall risk.    Baseline  06/02/19: 10 repetitions    Time  12    Period  Weeks    Status  New    Target Date  08/25/19            Plan - 06/18/19 1022    Clinical Impression Statement  Pt demonstrates excellent motivation today. He reports some fatigue from chemotherapy treatment yesterday so added additional hooklying exercises into session today and avoided leg press. He is able to perform some resisted gait today as well as TRX squats. Pt requires cues for proper form with squats to avoid excessive anterior knee pressure. Pt encouraged to continue HEP. He will benefit from PT services to address deficits in strength, balance, and mobility in order to return to full function at home.    Personal Factors and Comorbidities  Age;Comorbidity 3+;Time since onset of injury/illness/exacerbation    Comorbidities  Urothelial cancer, DM, previous lumbar disc bulge, interstitial pulmonary disease    Examination-Activity Limitations  Locomotion Level;Stairs;Transfers    Examination-Participation Restrictions  Community Activity;Laundry;Shop    Stability/Clinical Decision Making  Evolving/Moderate complexity    Rehab Potential  Fair    PT Frequency  2x / week    PT Duration  12 weeks    PT Treatment/Interventions  ADLs/Self Care Home Management;Aquatic Therapy;Biofeedback;Canalith Repostioning;Cryotherapy;Iontophoresis 4mg /ml Dexamethasone;Moist Heat;Traction;Ultrasound;DME Instruction;Gait training;Stair training;Functional mobility training;Therapeutic activities;Therapeutic exercise;Balance training;Neuromuscular  re-education;Patient/family education;Orthotic Fit/Training;Manual techniques;Dry needling;Energy conservation;Vestibular;Joint Manipulations    PT Next Visit Plan  Continue with strengthening and balance    PT Home Exercise Plan  continue as prescribed by [facility] PT and bring to next session  Consulted and Agree with Plan of Care  Patient       Patient will benefit from skilled therapeutic intervention in order to improve the following deficits and impairments:  Abnormal gait, Decreased activity tolerance, Decreased balance, Decreased strength  Visit Diagnosis: Muscle weakness (generalized)  Unsteadiness on feet     Problem List Patient Active Problem List   Diagnosis Date Noted  . Chest pain 03/04/2019  . Anemia due to antineoplastic chemotherapy 03/03/2018  . Cough 03/03/2018  . Chemotherapy induced neutropenia ([place]) 01/22/2018  . B12 deficiency 01/01/2018  . Hypomagnesemia 12/21/2017  . Encounter for antineoplastic chemotherapy 12/21/2017  . Malignant neoplasm of kidney ([place])   . Urothelial cancer ([place]) 12/18/2017  . Malnutrition of moderate degree 12/18/2017  . Therapeutic opioid induced constipation   . Palliative care encounter   . Dehydration 12/17/2017  . Goals of care, counseling/discussion 12/13/2017  . Urothelial carcinoma of kidney, left ([place]) 12/07/2017  . Right inguinal hernia 07/17/2015  . Family history of colon cancer 07/17/2015  . Allergic rhinitis 11/12/2014  . Airway hyperreactivity 11/12/2014  . Atherosclerosis of coronary artery 11/12/2014  . Cheilitis 11/12/2014  . Narrowing of intervertebral disc space 11/12/2014  . Deflected nasal septum 11/12/2014  . HLD (hyperlipidemia) 11/12/2014  . BP (high blood pressure) 11/12/2014  . Adult hypothyroidism 11/12/2014  . Sleep apnea 11/12/2014  . Diabetes mellitus, type 2 ([place]) 11/12/2014  . Degenerative disc disease, lumbar 09/24/2012  . Furunculosis 04/23/2012     Phillips Grout PT, DPT, GCS   Fizza Scales 06/18/2019, 1:30 PM  [place] MAIN [facility] SERVICES [address] [place], [place], [address] Phone: [phone]   Fax:  [phone]  Name: Patrick Mcgee MRN: [id number] Date of Birth: 11/28/1945

## 2019-06-19 ENCOUNTER — Inpatient Hospital Stay: Payer: Medicare Other

## 2019-06-19 NOTE — Progress Notes (Signed)
Nutrition Follow-up:  Patient with metastatic urothelial cancer currently on chemotherapy.  Patient followed by Dr. Janese Banks. Patient followed by Palliative care.   Spoke with patient via phone this pm for nutrition follow-up.  Patient reports that appetite is a little bit better and has gained weight.  Usually eats 2 good meals a day.  Woke up late and has eaten cereal and banana so far today.  Continues to drink premier protein shakes mixed with fruit typically 1 time per day.  Wife prepares meals for patient.  Reports that he struggles with breakfast ideas.      Medications: reviewed  Labs: reviewed  Anthropometrics:   Weight increased to 151 lb on 12/8 from 148 lb on 11/10.     NUTRITION DIAGNOSIS:  Inadequate oral intake improving with weight gain   INTERVENTION:  Reviewed strategies to increase calories and protein Encouraged patient to liberalize diet, no diet restrictions.  Encouraged continuation of premier protein shake. Discussed different breakfast options for patient.     MONITORING, EVALUATION, GOAL: intake, weight trends   NEXT VISIT: phone f/u Jan 21  Myrlene Riera B. Zenia Resides, [place], [place] Registered Dietitian [phone] (pager)

## 2019-06-23 ENCOUNTER — Inpatient Hospital Stay: Payer: Medicare Other

## 2019-06-23 ENCOUNTER — Other Ambulatory Visit: Payer: Self-pay | Admitting: Oncology

## 2019-06-23 ENCOUNTER — Encounter: Payer: Self-pay | Admitting: Oncology

## 2019-06-23 ENCOUNTER — Other Ambulatory Visit: Payer: Self-pay

## 2019-06-23 ENCOUNTER — Telehealth: Payer: Self-pay

## 2019-06-23 ENCOUNTER — Ambulatory Visit: Payer: Medicare Other

## 2019-06-23 VITALS — BP 103/60 | HR 88 | Temp 97.0°F | Resp 18

## 2019-06-23 DIAGNOSIS — M6281 Muscle weakness (generalized): Secondary | ICD-10-CM | POA: Diagnosis not present

## 2019-06-23 DIAGNOSIS — D701 Agranulocytosis secondary to cancer chemotherapy: Secondary | ICD-10-CM

## 2019-06-23 DIAGNOSIS — T451X5A Adverse effect of antineoplastic and immunosuppressive drugs, initial encounter: Secondary | ICD-10-CM

## 2019-06-23 DIAGNOSIS — C642 Malignant neoplasm of left kidney, except renal pelvis: Secondary | ICD-10-CM

## 2019-06-23 DIAGNOSIS — R2681 Unsteadiness on feet: Secondary | ICD-10-CM

## 2019-06-23 DIAGNOSIS — Z95828 Presence of other vascular implants and grafts: Secondary | ICD-10-CM

## 2019-06-23 DIAGNOSIS — Z5112 Encounter for antineoplastic immunotherapy: Secondary | ICD-10-CM | POA: Diagnosis not present

## 2019-06-23 LAB — CBC WITH DIFFERENTIAL/PLATELET
Abs Immature Granulocytes: 0.01 10*3/uL (ref 0.00–0.07)
Basophils Absolute: 0.1 10*3/uL (ref 0.0–0.1)
Basophils Relative: 1 %
Eosinophils Absolute: 0.1 10*3/uL (ref 0.0–0.5)
Eosinophils Relative: 2 %
HCT: 38.3 % — ABNORMAL LOW (ref 39.0–52.0)
Hemoglobin: 12 g/dL — ABNORMAL LOW (ref 13.0–17.0)
Immature Granulocytes: 0 %
Lymphocytes Relative: 13 %
Lymphs Abs: 0.9 10*3/uL (ref 0.7–4.0)
MCH: 27.7 pg (ref 26.0–34.0)
MCHC: 31.3 g/dL (ref 30.0–36.0)
MCV: 88.5 fL (ref 80.0–100.0)
Monocytes Absolute: 0.4 10*3/uL (ref 0.1–1.0)
Monocytes Relative: 5 %
Neutro Abs: 5.5 10*3/uL (ref 1.7–7.7)
Neutrophils Relative %: 79 %
Platelets: 247 10*3/uL (ref 150–400)
RBC: 4.33 MIL/uL (ref 4.22–5.81)
RDW: 17.2 % — ABNORMAL HIGH (ref 11.5–15.5)
WBC: 7 10*3/uL (ref 4.0–10.5)
nRBC: 0 % (ref 0.0–0.2)

## 2019-06-23 LAB — COMPREHENSIVE METABOLIC PANEL
ALT: 15 U/L (ref 0–44)
AST: 22 U/L (ref 15–41)
Albumin: 3.9 g/dL (ref 3.5–5.0)
Alkaline Phosphatase: 94 U/L (ref 38–126)
Anion gap: 13 (ref 5–15)
BUN: 24 mg/dL — ABNORMAL HIGH (ref 8–23)
CO2: 24 mmol/L (ref 22–32)
Calcium: 9.7 mg/dL (ref 8.9–10.3)
Chloride: 101 mmol/L (ref 98–111)
Creatinine, Ser: 0.83 mg/dL (ref 0.61–1.24)
GFR calc Af Amer: >60 mL/min (ref >60)
GFR calc non Af Amer: >60 mL/min (ref >60)
Glucose, Bld: 261 mg/dL — ABNORMAL HIGH (ref 70–99)
Potassium: 4.5 mmol/L (ref 3.5–5.1)
Sodium: 138 mmol/L (ref 135–145)
Total Bilirubin: 0.8 mg/dL (ref 0.3–1.2)
Total Protein: 7.4 g/dL (ref 6.5–8.1)

## 2019-06-23 MED ORDER — SODIUM CHLORIDE 0.9 % IV SOLN: 1.0000 mg/kg | Freq: Once | INTRAVENOUS | Status: DC

## 2019-06-23 MED ORDER — SODIUM CHLORIDE 0.9 % IV SOLN
Freq: Once | INTRAVENOUS | Status: AC
  Administered 2019-06-23: 12:00:00 via INTRAVENOUS
  Filled 2019-06-23: qty 250

## 2019-06-23 MED ORDER — HEPARIN SOD (PORK) LOCK FLUSH 100 UNIT/ML IV SOLN
500.0000 [IU] | Freq: Once | INTRAVENOUS | Status: AC | PRN
  Administered 2019-06-23: 500 [IU]
  Filled 2019-06-23: qty 5

## 2019-06-23 MED ORDER — PALONOSETRON HCL INJECTION 0.25 MG/5ML: 0.2500 mg | Freq: Once | INTRAVENOUS | Status: DC

## 2019-06-23 MED ORDER — SODIUM CHLORIDE 0.9% FLUSH
10.0000 mL | Freq: Once | INTRAVENOUS | Status: AC
  Administered 2019-06-23: 11:00:00 10 mL via INTRAVENOUS
  Filled 2019-06-23: qty 10

## 2019-06-23 NOTE — Progress Notes (Signed)
Glucose 261. Withhold treatment today per MD

## 2019-06-23 NOTE — Telephone Encounter (Signed)
Telephone call to patients home, patient noe wife answered phone.  RN left message requesting call back to schedule visit.

## 2019-06-23 NOTE — Therapy (Signed)
[place] MAIN [facility] SERVICES [address] [place], [place], [address] Phone: [phone]   Fax:  [phone]  Physical Therapy Treatment  Patient Details  Name: Patrick Mcgee MRN: [id number] Date of Birth: Nov 25, 1945 Referring Provider (PT): Dr. Rosanna Randy   Encounter Date: 06/23/2019  PT End of Session - 06/23/19 1019    Visit Number  6    Number of Visits  25    Date for PT Re-Evaluation  08/25/19    PT Start Time  1020    PT Stop Time  1105    PT Time Calculation (min)  45 min    Equipment Utilized During Treatment  Gait belt    Activity Tolerance  Patient tolerated treatment well    Behavior During Therapy  [facility] for tasks assessed/performed       Past Medical History:  Diagnosis Date  . Acid reflux   . Anxiety   . Depression   . Diabetes mellitus without complication ([place])   . Hyperlipidemia   . Hypertension   . MRSA (methicillin resistant Staphylococcus aureus) infection 1992   history  . Myocardial infarction ([place]) 1995  . Sleep apnea    CPAP  . Urothelial cancer ([place]) 11/2017   Left Urothelial mass, chemo tx's    Past Surgical History:  Procedure Laterality Date  . CATARACT EXTRACTION Left   . COLONOSCOPY  2010   [facility]  . COLONOSCOPY WITH PROPOFOL N/A 10/04/2016   Procedure: COLONOSCOPY WITH PROPOFOL;  Surgeon: [name], MD;  Location: [facility] ENDOSCOPY;  Service: Endoscopy;  Laterality: N/A;  . [place], 2000, 2001, 2014  . CYSTOSCOPY W/ RETROGRADES Bilateral 12/10/2017   Procedure: CYSTOSCOPY WITH RETROGRADE PYELOGRAM;  Surgeon: [name], MD;  Location: [facility] ORS;  Service: Urology;  Laterality: Bilateral;  . CYSTOSCOPY W/ RETROGRADES Left 12/09/2018   Procedure: CYSTOSCOPY WITH RETROGRADE PYELOGRAM;  Surgeon: [name], MD;  Location: [facility] ORS;  Service: Urology;  Laterality: Left;  . CYSTOSCOPY WITH BIOPSY Left 12/09/2018   Procedure: CYSTOSCOPY WITH  URETERAL/RENAL PELVIC BIOPSY;  Surgeon: [name], MD;  Location: [facility] ORS;  Service: Urology;  Laterality: Left;  . CYSTOSCOPY WITH STENT PLACEMENT Left 12/10/2017   Procedure: CYSTOSCOPY WITH STENT PLACEMENT;  Surgeon: [name], MD;  Location: [facility] ORS;  Service: Urology;  Laterality: Left;  . CYSTOSCOPY WITH STENT PLACEMENT Left 12/09/2018   Procedure: CYSTOSCOPY WITH STENT PLACEMENT;  Surgeon: [name], MD;  Location: [facility] ORS;  Service: Urology;  Laterality: Left;  . CYSTOSCOPY WITH URETEROSCOPY Left 12/09/2018   Procedure: CYSTOSCOPY WITH URETEROSCOPY;  Surgeon: [name], MD;  Location: [facility] ORS;  Service: Urology;  Laterality: Left;  . EYE SURGERY Bilateral    cataract  . INGUINAL HERNIA REPAIR Right 08/09/2015   Procedure: HERNIA REPAIR INGUINAL ADULT;  Surgeon: [name], MD;  Location: [facility] ORS;  Service: General;  Laterality: Right;  . NASAL SINUS SURGERY    . nuclear stress test    . PORTA CATH INSERTION N/A 12/19/2017   Procedure: PORTA CATH INSERTION;  Surgeon: [name], MD;  Location: [place] CV LAB;  Service: Cardiovascular;  Laterality: N/A;  . URETERAL BIOPSY Left 12/10/2017   Procedure: URETERAL & renal PELVIS BIOPSY;  Surgeon: [name], MD;  Location: [facility] ORS;  Service: Urology;  Laterality: Left;  . URETEROSCOPY Left 12/10/2017   Procedure: URETEROSCOPY;  Surgeon: [name], MD;  Location: [facility] ORS;  Service: Urology;  Laterality: Left;  There were no vitals filed for this visit.  Subjective Assessment - 06/23/19 1018    Subjective  Pt doing well today. No falls since last session. Denies pain. He has chemotherapy after therapy session today. No specific questions or concerns at this time.    Pertinent History  Part of history borrowed from oncology note and verified with patient: Patient is a 73 year old male who was referred for foot drop. He started with Dr. [name] so far for his metastatic urothelial carcinoma.  This  was originally diagnosed in May 2019. He was started on chemotherapy in June 2019 and was continued on 05/27/2018 for 8 cycles.  He tolerated chemotherapy well except for chemo-induced anemia for which she has been getting Procrit every 2 weeks.  He did have response to his disease based on scans in August 2019.  However repeat scan on 05/31/2018 showed increase in the size of the primary tumor from 1.2 to 1.9 cm and increase in left para-aortic adenopathy from 0.9 to 1.3 cm.  Second line immunotherapy was recommended.  His initial biopsy specimen did not have enough sample to undergo FGFR mutation testing. He also has mild interstitial lung disease for which he sees pulmonary but he is not on home oxygen.  He has B12 deficiency for which he is on oral B12.  Also has diabetes and coronary artery disease. Patient noted to have disease progression in his lymph nodes in May 2020.  Repeat biopsy showed metastatic urothelial carcinoma which did not have a FGFR mutation.  He has been started on third line Padcev. There is still times when he feels fatigued but he is having more good days. He reports poor appetite. He was working with physical therapy on his foot drop, RLE weakness, and unsteadiness. He states that he gets considerable numbness for the 7-8 days following his chemotherapy treatment. He states that he is a diabetic and did have some numbness prior to starting chemotherapy. He states that he has a history of a low back disc bulge with RLE radicular symptoms. He used to get spinal injections but reports he hasn't had any injections in the last 10 years.    Currently in Pain?  No/denies           TREATMENT   Ther-ex Xride warm-up with UE x 5 minutes, 45s intervals alternating between L3 and L6, fatigue and challenge monitored throughout and seat height/inclination adjusted; TRX squats 2 x 15, extensive cues required for proper form with hip hinge and to avoid knees crossing toe line; TRX  reverse lunge x 10 bilateral; Quantum single leg press 75# R: x 12, x 10, L: 2 x 15; Hip flexion marching with 3# ankle weight (AW) 2 x 15 bilateral; Hip abduction with 3# AW 2 x 15 bilateral; Hip extension with 3# AW 2 x 15 bilateral; HS culrs with 3# AW 2 x 15 bilateral; Seated R ankle dorsiflexion 2 x 10, with both verbal and tactile cues;   Neuromuscular Re-education Airex WBOS eyes open/closed x 30s each; Airex NBOS eyes open/closed x 30s each; Airex half-tandem alternating forward LE eyes open x 30s each; Airex feet apart balloon taps in multiple directions; Feet together balloon taps in multiple directions; Airex 6" step taps alternating LE x 15 each;   Pt educated throughout session about proper posture and technique with exercises. Improved exercise technique, movement at target joints, use of target muscles after min to mod verbal, visual, tactile cues.    Pt demonstrates excellent motivation today.  He continues to demonstrate some fatigue today but it is improved compared to last session. He struggles with form/technique during squats and reverse lunges requiring extensive cues for technique. Reintroduced some additional balance exercises again today. Pt encouraged to continue HEP. He will benefit from PT services to address deficits in strength, balance, and mobility in order to return to full function at home.                        PT Short Term Goals - 06/02/19 1242      PT SHORT TERM GOAL #1   Title  Pt will be independent with HEP in order to improve strength and balance in order to decrease fall risk and improve function at home    Time  6    Period  Weeks    Status  New    Target Date  07/14/19        PT Long Term Goals - 06/02/19 1243      PT LONG TERM GOAL #1   Title  Pt will improve BERG by at least 3 points in order to demonstrate clinically significant improvement in balance.    Baseline  06/02/19: 48/56    Time  12     Period  Weeks    Status  New    Target Date  08/25/19      PT LONG TERM GOAL #2   Title  Pt will improve ABC by at least 13% in order to demonstrate clinically significant improvement in balance confidence.    Baseline  06/02/19: 64.4%    Time  12    Period  Weeks    Status  New    Target Date  08/25/19      PT LONG TERM GOAL #3   Title  Pt will increase 30s Sit to Stand Test to greater than or equal to 14 repetitions in order to demonstrate clinically significant improvement in LE endurance and decrease in fall risk.    Baseline  06/02/19: 10 repetitions    Time  12    Period  Weeks    Status  New    Target Date  08/25/19            Plan - 06/23/19 1019    Clinical Impression Statement  Pt demonstrates excellent motivation today. He continues to demonstrate some fatigue today but it is improved compared to last session. He struggles with form/technique during squats and reverse lunges requiring extensive cues for technique. Reintroduced some additional balance exercises again today. Pt encouraged to continue HEP. He will benefit from PT services to address deficits in strength, balance, and mobility in order to return to full function at home.    Personal Factors and Comorbidities  Age;Comorbidity 3+;Time since onset of injury/illness/exacerbation    Comorbidities  Urothelial cancer, DM, previous lumbar disc bulge, interstitial pulmonary disease    Examination-Activity Limitations  Locomotion Level;Stairs;Transfers    Examination-Participation Restrictions  Community Activity;Laundry;Shop    Stability/Clinical Decision Making  Evolving/Moderate complexity    Rehab Potential  Fair    PT Frequency  2x / week    PT Duration  12 weeks    PT Treatment/Interventions  ADLs/Self Care Home Management;Aquatic Therapy;Biofeedback;Canalith Repostioning;Cryotherapy;Iontophoresis 4mg /ml Dexamethasone;Moist Heat;Traction;Ultrasound;DME Instruction;Gait training;Stair training;Functional  mobility training;Therapeutic activities;Therapeutic exercise;Balance training;Neuromuscular re-education;Patient/family education;Orthotic Fit/Training;Manual techniques;Dry needling;Energy conservation;Vestibular;Joint Manipulations    PT Next Visit Plan  Continue with strengthening and balance    PT Home Exercise Plan  continue  as prescribed by [facility] PT and bring to next session    Consulted and Agree with Plan of Care  Patient       Patient will benefit from skilled therapeutic intervention in order to improve the following deficits and impairments:  Abnormal gait, Decreased activity tolerance, Decreased balance, Decreased strength  Visit Diagnosis: Muscle weakness (generalized)  Unsteadiness on feet     Problem List Patient Active Problem List   Diagnosis Date Noted  . Chest pain 03/04/2019  . Anemia due to antineoplastic chemotherapy 03/03/2018  . Cough 03/03/2018  . Chemotherapy induced neutropenia ([place]) 01/22/2018  . B12 deficiency 01/01/2018  . Hypomagnesemia 12/21/2017  . Encounter for antineoplastic chemotherapy 12/21/2017  . Malignant neoplasm of kidney ([place])   . Urothelial cancer ([place]) 12/18/2017  . Malnutrition of moderate degree 12/18/2017  . Therapeutic opioid induced constipation   . Palliative care encounter   . Dehydration 12/17/2017  . Goals of care, counseling/discussion 12/13/2017  . Urothelial carcinoma of kidney, left ([place]) 12/07/2017  . Right inguinal hernia 07/17/2015  . Family history of colon cancer 07/17/2015  . Allergic rhinitis 11/12/2014  . Airway hyperreactivity 11/12/2014  . Atherosclerosis of coronary artery 11/12/2014  . Cheilitis 11/12/2014  . Narrowing of intervertebral disc space 11/12/2014  . Deflected nasal septum 11/12/2014  . HLD (hyperlipidemia) 11/12/2014  . BP (high blood pressure) 11/12/2014  . Adult hypothyroidism 11/12/2014  . Sleep apnea 11/12/2014  . Diabetes mellitus, type 2 ([place]) 11/12/2014  . Degenerative disc disease,  lumbar 09/24/2012  . Furunculosis 04/23/2012   Phillips Grout PT, DPT, GCS  Rohen Kimes 06/23/2019, 3:15 PM  [place] MAIN [facility] SERVICES [address] [place], [place], [address] Phone: [phone]   Fax:  [phone]  Name: Patrick Mcgee MRN: [id number] Date of Birth: [birth year]

## 2019-06-25 ENCOUNTER — Ambulatory Visit: Payer: Medicare Other

## 2019-06-25 ENCOUNTER — Telehealth: Payer: Self-pay

## 2019-06-25 ENCOUNTER — Other Ambulatory Visit: Payer: Self-pay

## 2019-06-25 DIAGNOSIS — M6281 Muscle weakness (generalized): Secondary | ICD-10-CM | POA: Diagnosis not present

## 2019-06-25 DIAGNOSIS — R2681 Unsteadiness on feet: Secondary | ICD-10-CM

## 2019-06-25 NOTE — Therapy (Signed)
[place] MAIN [facility] SERVICES [address] [place], [place], [address] Phone: [phone]   Fax:  [phone]  Physical Therapy Treatment  Patient Details  Name: Patrick Mcgee MRN: [id number] Date of Birth: 1946-02-16 Referring Provider (PT): Dr. Rosanna Randy   Encounter Date: 06/25/2019  PT End of Session - 06/25/19 1020    Visit Number  7    Number of Visits  25    Date for PT Re-Evaluation  08/25/19    PT Start Time  1012    PT Stop Time  1110    PT Time Calculation (min)  58 min    Equipment Utilized During Treatment  Gait belt    Activity Tolerance  Patient tolerated treatment well    Behavior During Therapy  [facility] for tasks assessed/performed       Past Medical History:  Diagnosis Date  . Acid reflux   . Anxiety   . Depression   . Diabetes mellitus without complication ([place])   . Hyperlipidemia   . Hypertension   . MRSA (methicillin resistant Staphylococcus aureus) infection 1992   history  . Myocardial infarction ([place]) 1995  . Sleep apnea    CPAP  . Urothelial cancer ([place]) 11/2017   Left Urothelial mass, chemo tx's    Past Surgical History:  Procedure Laterality Date  . CATARACT EXTRACTION Left   . COLONOSCOPY  2010   [facility]  . COLONOSCOPY WITH PROPOFOL N/A 10/04/2016   Procedure: COLONOSCOPY WITH PROPOFOL;  Surgeon: [name], MD;  Location: [facility] ENDOSCOPY;  Service: Endoscopy;  Laterality: N/A;  . [place], 2000, 2001, 2014  . CYSTOSCOPY W/ RETROGRADES Bilateral 12/10/2017   Procedure: CYSTOSCOPY WITH RETROGRADE PYELOGRAM;  Surgeon: [name], MD;  Location: [facility] ORS;  Service: Urology;  Laterality: Bilateral;  . CYSTOSCOPY W/ RETROGRADES Left 12/09/2018   Procedure: CYSTOSCOPY WITH RETROGRADE PYELOGRAM;  Surgeon: [name], MD;  Location: [facility] ORS;  Service: Urology;  Laterality: Left;  . CYSTOSCOPY WITH BIOPSY Left 12/09/2018   Procedure: CYSTOSCOPY WITH  URETERAL/RENAL PELVIC BIOPSY;  Surgeon: [name], MD;  Location: [facility] ORS;  Service: Urology;  Laterality: Left;  . CYSTOSCOPY WITH STENT PLACEMENT Left 12/10/2017   Procedure: CYSTOSCOPY WITH STENT PLACEMENT;  Surgeon: [name], MD;  Location: [facility] ORS;  Service: Urology;  Laterality: Left;  . CYSTOSCOPY WITH STENT PLACEMENT Left 12/09/2018   Procedure: CYSTOSCOPY WITH STENT PLACEMENT;  Surgeon: [name], MD;  Location: [facility] ORS;  Service: Urology;  Laterality: Left;  . CYSTOSCOPY WITH URETEROSCOPY Left 12/09/2018   Procedure: CYSTOSCOPY WITH URETEROSCOPY;  Surgeon: [name], MD;  Location: [facility] ORS;  Service: Urology;  Laterality: Left;  . EYE SURGERY Bilateral    cataract  . INGUINAL HERNIA REPAIR Right 08/09/2015   Procedure: HERNIA REPAIR INGUINAL ADULT;  Surgeon: [name], MD;  Location: [facility] ORS;  Service: General;  Laterality: Right;  . NASAL SINUS SURGERY    . nuclear stress test    . PORTA CATH INSERTION N/A 12/19/2017   Procedure: PORTA CATH INSERTION;  Surgeon: [name], MD;  Location: [place] CV LAB;  Service: Cardiovascular;  Laterality: N/A;  . URETERAL BIOPSY Left 12/10/2017   Procedure: URETERAL & renal PELVIS BIOPSY;  Surgeon: [name], MD;  Location: [facility] ORS;  Service: Urology;  Laterality: Left;  . URETEROSCOPY Left 12/10/2017   Procedure: URETEROSCOPY;  Surgeon: [name], MD;  Location: [facility] ORS;  Service: Urology;  Laterality: Left;  There were no vitals filed for this visit.  Subjective Assessment - 06/25/19 1428    Subjective  Pt doing well today. No falls since last session. Denies pain. He was unable to get his chemotherapy on Monday due to elevated blood sugar. No specific questions or concerns at this time.    Pertinent History  Part of history borrowed from oncology note and verified with patient: Patient is a 73 year old male who was referred for foot drop. He started with Dr. [name] so far for his metastatic  urothelial carcinoma.  This was originally diagnosed in May 2019. He was started on chemotherapy in June 2019 and was continued on 05/27/2018 for 8 cycles.  He tolerated chemotherapy well except for chemo-induced anemia for which she has been getting Procrit every 2 weeks.  He did have response to his disease based on scans in August 2019.  However repeat scan on 05/31/2018 showed increase in the size of the primary tumor from 1.2 to 1.9 cm and increase in left para-aortic adenopathy from 0.9 to 1.3 cm.  Second line immunotherapy was recommended.  His initial biopsy specimen did not have enough sample to undergo FGFR mutation testing. He also has mild interstitial lung disease for which he sees pulmonary but he is not on home oxygen.  He has B12 deficiency for which he is on oral B12.  Also has diabetes and coronary artery disease. Patient noted to have disease progression in his lymph nodes in May 2020.  Repeat biopsy showed metastatic urothelial carcinoma which did not have a FGFR mutation.  He has been started on third line Padcev. There is still times when he feels fatigued but he is having more good days. He reports poor appetite. He was working with physical therapy on his foot drop, RLE weakness, and unsteadiness. He states that he gets considerable numbness for the 7-8 days following his chemotherapy treatment. He states that he is a diabetic and did have some numbness prior to starting chemotherapy. He states that he has a history of a low back disc bulge with RLE radicular symptoms. He used to get spinal injections but reports he hasn't had any injections in the last 10 years.    Currently in Pain?  No/denies        TREATMENT   Ther-ex Xride warm-up with UE x 5 minutes, 45s intervals alternating between L4and L8, fatigue and challenge monitored throughout; Quantum single leg press75# R:x 15, x 13, L: 2 x 15; Matrix resisted gait 12.5# forward, retro, R lateral, and L lateral x 3 each  direction; TRX squats 2 x 15, extensive cues required for proper form with hip hinge and to avoid knees crossing toe line however improved from prior session utilized ; Sit to stand without UE support with Airex under feet x 5, x 7, occasional minA+1 required for fatigue and LOB  ; Seated R ankle dorsiflexion2 x 10 with gentle manual resistance;   Neuromuscular Re-education 1/2 foam roll (flat side up) static balance x 30s; 1/2 foam roll (flat side up) heel/toe rocking x 30s; 1/2 foam roll (flat side up) tandem gait alternating forward LE x 30s each; Alternating Airex 6" step taps without UE support x 10 each; Airex cone taps with therapist calling out laterality and color in 1 and eventually 2 cone sequences, pt challenged by varying cone distance and crossing midline;   Pt educated throughout session about proper posture and technique with exercises. Improved exercise technique, movement at target joints, use of target  muscles after min to mod verbal, visual, tactile cues.    Pt demonstrates excellent motivation today.He demonstrates improved energy today and improved form with exercises. He is able to increase his repetitions on the leg press. He does struggle with single leg balance on Airex pad while performing cone taps. Pt encouraged to continue HEP. Hewill benefit from PT services to address deficits in strength, balance, and mobility in order to return to full function at home.                          PT Short Term Goals - 06/02/19 1242      PT SHORT TERM GOAL #1   Title  Pt will be independent with HEP in order to improve strength and balance in order to decrease fall risk and improve function at home    Time  6    Period  Weeks    Status  New    Target Date  07/14/19        PT Long Term Goals - 06/02/19 1243      PT LONG TERM GOAL #1   Title  Pt will improve BERG by at least 3 points in order to demonstrate clinically significant  improvement in balance.    Baseline  06/02/19: 48/56    Time  12    Period  Weeks    Status  New    Target Date  08/25/19      PT LONG TERM GOAL #2   Title  Pt will improve ABC by at least 13% in order to demonstrate clinically significant improvement in balance confidence.    Baseline  06/02/19: 64.4%    Time  12    Period  Weeks    Status  New    Target Date  08/25/19      PT LONG TERM GOAL #3   Title  Pt will increase 30s Sit to Stand Test to greater than or equal to 14 repetitions in order to demonstrate clinically significant improvement in LE endurance and decrease in fall risk.    Baseline  06/02/19: 10 repetitions    Time  12    Period  Weeks    Status  New    Target Date  08/25/19            Plan - 06/25/19 1015    Clinical Impression Statement  Pt demonstrates excellent motivation today. He demonstrates improved energy today and improved form with exercises. He is able to increase his repetitions on the leg press. He does struggle with single leg balance on Airex pad while performing cone taps. Pt encouraged to continue HEP. He will benefit from PT services to address deficits in strength, balance, and mobility in order to return to full function at home.    Personal Factors and Comorbidities  Age;Comorbidity 3+;Time since onset of injury/illness/exacerbation    Comorbidities  Urothelial cancer, DM, previous lumbar disc bulge, interstitial pulmonary disease    Examination-Activity Limitations  Locomotion Level;Stairs;Transfers    Examination-Participation Restrictions  Community Activity;Laundry;Shop    Stability/Clinical Decision Making  Evolving/Moderate complexity    Rehab Potential  Fair    PT Frequency  2x / week    PT Duration  12 weeks    PT Treatment/Interventions  ADLs/Self Care Home Management;Aquatic Therapy;Biofeedback;Canalith Repostioning;Cryotherapy;Iontophoresis 4mg /ml Dexamethasone;Moist Heat;Traction;Ultrasound;DME Instruction;Gait training;Stair  training;Functional mobility training;Therapeutic activities;Therapeutic exercise;Balance training;Neuromuscular re-education;Patient/family education;Orthotic Fit/Training;Manual techniques;Dry needling;Energy conservation;Vestibular;Joint Manipulations    PT Next Visit Plan  Continue with strengthening and balance    PT Home Exercise Plan  Access Code: [email or web address]    Consulted and Agree with Plan of Care  Patient       Patient will benefit from skilled therapeutic intervention in order to improve the following deficits and impairments:  Abnormal gait, Decreased activity tolerance, Decreased balance, Decreased strength  Visit Diagnosis: Muscle weakness (generalized)  Unsteadiness on feet     Problem List Patient Active Problem List   Diagnosis Date Noted  . Chest pain 03/04/2019  . Anemia due to antineoplastic chemotherapy 03/03/2018  . Cough 03/03/2018  . Chemotherapy induced neutropenia ([place]) 01/22/2018  . B12 deficiency 01/01/2018  . Hypomagnesemia 12/21/2017  . Encounter for antineoplastic chemotherapy 12/21/2017  . Malignant neoplasm of kidney ([place])   . Urothelial cancer ([place]) 12/18/2017  . Malnutrition of moderate degree 12/18/2017  . Therapeutic opioid induced constipation   . Palliative care encounter   . Dehydration 12/17/2017  . Goals of care, counseling/discussion 12/13/2017  . Urothelial carcinoma of kidney, left ([place]) 12/07/2017  . Right inguinal hernia 07/17/2015  . Family history of colon cancer 07/17/2015  . Allergic rhinitis 11/12/2014  . Airway hyperreactivity 11/12/2014  . Atherosclerosis of coronary artery 11/12/2014  . Cheilitis 11/12/2014  . Narrowing of intervertebral disc space 11/12/2014  . Deflected nasal septum 11/12/2014  . HLD (hyperlipidemia) 11/12/2014  . BP (high blood pressure) 11/12/2014  . Adult hypothyroidism 11/12/2014  . Sleep apnea 11/12/2014  . Diabetes mellitus, type 2 ([place]) 11/12/2014  . Degenerative disc disease, lumbar  09/24/2012  . Furunculosis 04/23/2012   Phillips Grout PT, DPT, GCS  Maryon Kemnitz 06/25/2019, 2:34 PM  [place] MAIN [facility] SERVICES [address] [place], [place], [address] Phone: [phone]   Fax:  [phone]  Name: Patrick Mcgee MRN: [id number] Date of Birth: 01-03-1946

## 2019-06-25 NOTE — Telephone Encounter (Signed)
Patients wife Patrick Mcgee returned call after receiving VM from RN .  Patrick Mcgee reports patient was unable to receive 2nd treatment due to his blood sugar being elevated.  Patrick Mcgee states patient will have scan on 07/14/19 and see Dr Tasia Catchings on 07/15/19.  Wife states since they will not have information on scan they would like palliative team to visit in January.  Wife requests call back the week of 07/17/19 to schedule palliative care visit time.

## 2019-06-26 NOTE — Telephone Encounter (Signed)
Please let him know he did not get padcev on 12/14 as blood sugars were >250. By protocol we are reqired to hold his infusion. We can complete cycle of dec 29th and then reschedule his scans

## 2019-06-30 ENCOUNTER — Ambulatory Visit: Payer: Medicare Other

## 2019-06-30 ENCOUNTER — Other Ambulatory Visit: Payer: Self-pay

## 2019-06-30 DIAGNOSIS — R2681 Unsteadiness on feet: Secondary | ICD-10-CM

## 2019-06-30 DIAGNOSIS — M6281 Muscle weakness (generalized): Secondary | ICD-10-CM

## 2019-06-30 NOTE — Therapy (Signed)
[place] MAIN [facility] SERVICES [address] [place], [place], [address] Phone: [phone]   Fax:  [phone]  Physical Therapy Treatment  Patient Details  Name: Patrick Mcgee MRN: [id number] Date of Birth: 06/13/1946 Referring Provider (PT): Dr. Rosanna Randy   Encounter Date: 06/30/2019  PT End of Session - 06/30/19 1020    Visit Number  8    Number of Visits  25    Date for PT Re-Evaluation  08/25/19    PT Start Time  1016    PT Stop Time  1100    PT Time Calculation (min)  44 min    Equipment Utilized During Treatment  Gait belt    Activity Tolerance  Patient tolerated treatment well    Behavior During Therapy  [facility] for tasks assessed/performed       Past Medical History:  Diagnosis Date  . Acid reflux   . Anxiety   . Depression   . Diabetes mellitus without complication ([place])   . Hyperlipidemia   . Hypertension   . MRSA (methicillin resistant Staphylococcus aureus) infection 1992   history  . Myocardial infarction ([place]) 1995  . Sleep apnea    CPAP  . Urothelial cancer ([place]) 11/2017   Left Urothelial mass, chemo tx's    Past Surgical History:  Procedure Laterality Date  . CATARACT EXTRACTION Left   . COLONOSCOPY  2010   [facility]  . COLONOSCOPY WITH PROPOFOL N/A 10/04/2016   Procedure: COLONOSCOPY WITH PROPOFOL;  Surgeon: [name], MD;  Location: [facility] ENDOSCOPY;  Service: Endoscopy;  Laterality: N/A;  . [place], 2000, 2001, 2014  . CYSTOSCOPY W/ RETROGRADES Bilateral 12/10/2017   Procedure: CYSTOSCOPY WITH RETROGRADE PYELOGRAM;  Surgeon: [name], MD;  Location: [facility] ORS;  Service: Urology;  Laterality: Bilateral;  . CYSTOSCOPY W/ RETROGRADES Left 12/09/2018   Procedure: CYSTOSCOPY WITH RETROGRADE PYELOGRAM;  Surgeon: [name], MD;  Location: [facility] ORS;  Service: Urology;  Laterality: Left;  . CYSTOSCOPY WITH BIOPSY Left 12/09/2018   Procedure: CYSTOSCOPY WITH  URETERAL/RENAL PELVIC BIOPSY;  Surgeon: [name], MD;  Location: [facility] ORS;  Service: Urology;  Laterality: Left;  . CYSTOSCOPY WITH STENT PLACEMENT Left 12/10/2017   Procedure: CYSTOSCOPY WITH STENT PLACEMENT;  Surgeon: [name], MD;  Location: [facility] ORS;  Service: Urology;  Laterality: Left;  . CYSTOSCOPY WITH STENT PLACEMENT Left 12/09/2018   Procedure: CYSTOSCOPY WITH STENT PLACEMENT;  Surgeon: [name], MD;  Location: [facility] ORS;  Service: Urology;  Laterality: Left;  . CYSTOSCOPY WITH URETEROSCOPY Left 12/09/2018   Procedure: CYSTOSCOPY WITH URETEROSCOPY;  Surgeon: [name], MD;  Location: [facility] ORS;  Service: Urology;  Laterality: Left;  . EYE SURGERY Bilateral    cataract  . INGUINAL HERNIA REPAIR Right 08/09/2015   Procedure: HERNIA REPAIR INGUINAL ADULT;  Surgeon: [name], MD;  Location: [facility] ORS;  Service: General;  Laterality: Right;  . NASAL SINUS SURGERY    . nuclear stress test    . PORTA CATH INSERTION N/A 12/19/2017   Procedure: PORTA CATH INSERTION;  Surgeon: [name], MD;  Location: [place] CV LAB;  Service: Cardiovascular;  Laterality: N/A;  . URETERAL BIOPSY Left 12/10/2017   Procedure: URETERAL & renal PELVIS BIOPSY;  Surgeon: [name], MD;  Location: [facility] ORS;  Service: Urology;  Laterality: Left;  . URETEROSCOPY Left 12/10/2017   Procedure: URETEROSCOPY;  Surgeon: [name], MD;  Location: [facility] ORS;  Service: Urology;  Laterality: Left;  There were no vitals filed for this visit.  Subjective Assessment - 06/30/19 1019    Subjective  Pt doing well today. No falls since last session. Denies pain. "I'm feeling great." No specific questions or concerns at this time.    Pertinent History  Part of history borrowed from oncology note and verified with patient: Patient is a 73 year old male who was referred for foot drop. He started with Dr. [name] so far for his metastatic urothelial carcinoma.  This was originally diagnosed in  May 2019. He was started on chemotherapy in June 2019 and was continued on 05/27/2018 for 8 cycles.  He tolerated chemotherapy well except for chemo-induced anemia for which she has been getting Procrit every 2 weeks.  He did have response to his disease based on scans in August 2019.  However repeat scan on 05/31/2018 showed increase in the size of the primary tumor from 1.2 to 1.9 cm and increase in left para-aortic adenopathy from 0.9 to 1.3 cm.  Second line immunotherapy was recommended.  His initial biopsy specimen did not have enough sample to undergo FGFR mutation testing. He also has mild interstitial lung disease for which he sees pulmonary but he is not on home oxygen.  He has B12 deficiency for which he is on oral B12.  Also has diabetes and coronary artery disease. Patient noted to have disease progression in his lymph nodes in May 2020.  Repeat biopsy showed metastatic urothelial carcinoma which did not have a FGFR mutation.  He has been started on third line Padcev. There is still times when he feels fatigued but he is having more good days. He reports poor appetite. He was working with physical therapy on his foot drop, RLE weakness, and unsteadiness. He states that he gets considerable numbness for the 7-8 days following his chemotherapy treatment. He states that he is a diabetic and did have some numbness prior to starting chemotherapy. He states that he has a history of a low back disc bulge with RLE radicular symptoms. He used to get spinal injections but reports he hasn't had any injections in the last 10 years.    Currently in Pain?  No/denies         TREATMENT   Ther-ex Xride warm-up with UE x 5 minutes, 45s intervals alternating between L4and L8, fatigue and challenge monitored throughout; Quantum single leg press80#R:x 15, x 11, L: 2 x 15; Resisted side stepping with red tband around ankles 15' x 2 each direction; Seated clams with green tband with 3s hold 2 x 15; Seated  adductor ball squeeze with 3s hold 2 x 15; Sit to stand without UE support with Airex under feet x 5, x 7, occasional minA+1 required for fatigue and LOB  ; Seated R ankle dorsiflexion2x 10 with gentle manual resistance; 30s sit to stand, pt able to complete 14 reps, 2 sets performed;    Neuromuscular Re-education Airex 6" alternating step taps x 15 bilateral; Airex single leg balance with CGA/minA+1 from therapist 30s x 2 each; Airex modified tandem balance with horizontal ball passes around body with therapist adjusting height from waist to overhead x multiple bouts in each direction;   Pt educated throughout session about proper posture and technique with exercises. Improved exercise technique, movement at target joints, use of target muscles after min to mod verbal, visual, tactile cues.    Pt demonstrates excellent motivation today. He is able to increase his weight on the leg press today. He is demonstrating improving single  leg balance on the Airex pad today.Pt encouraged to continue HEP. Hewill benefit from PT services to address deficits in strength, balance, and mobility in order to return to full function at home.                        PT Short Term Goals - 06/02/19 1242      PT SHORT TERM GOAL #1   Title  Pt will be independent with HEP in order to improve strength and balance in order to decrease fall risk and improve function at home    Time  6    Period  Weeks    Status  New    Target Date  07/14/19        PT Long Term Goals - 06/02/19 1243      PT LONG TERM GOAL #1   Title  Pt will improve BERG by at least 3 points in order to demonstrate clinically significant improvement in balance.    Baseline  06/02/19: 48/56    Time  12    Period  Weeks    Status  New    Target Date  08/25/19      PT LONG TERM GOAL #2   Title  Pt will improve ABC by at least 13% in order to demonstrate clinically significant improvement in balance  confidence.    Baseline  06/02/19: 64.4%    Time  12    Period  Weeks    Status  New    Target Date  08/25/19      PT LONG TERM GOAL #3   Title  Pt will increase 30s Sit to Stand Test to greater than or equal to 14 repetitions in order to demonstrate clinically significant improvement in LE endurance and decrease in fall risk.    Baseline  06/02/19: 10 repetitions    Time  12    Period  Weeks    Status  New    Target Date  08/25/19            Plan - 06/30/19 1020    Clinical Impression Statement  Pt demonstrates excellent motivation today. He is able to increase his weight on the leg press today. He is demonstrating improving single leg balance on the Airex pad today. Pt encouraged to continue HEP. He will benefit from PT services to address deficits in strength, balance, and mobility in order to return to full function at home.    Personal Factors and Comorbidities  Age;Comorbidity 3+;Time since onset of injury/illness/exacerbation    Comorbidities  Urothelial cancer, DM, previous lumbar disc bulge, interstitial pulmonary disease    Examination-Activity Limitations  Locomotion Level;Stairs;Transfers    Examination-Participation Restrictions  Community Activity;Laundry;Shop    Stability/Clinical Decision Making  Evolving/Moderate complexity    Rehab Potential  Fair    PT Frequency  2x / week    PT Duration  12 weeks    PT Treatment/Interventions  ADLs/Self Care Home Management;Aquatic Therapy;Biofeedback;Canalith Repostioning;Cryotherapy;Iontophoresis 4mg /ml Dexamethasone;Moist Heat;Traction;Ultrasound;DME Instruction;Gait training;Stair training;Functional mobility training;Therapeutic activities;Therapeutic exercise;Balance training;Neuromuscular re-education;Patient/family education;Orthotic Fit/Training;Manual techniques;Dry needling;Energy conservation;Vestibular;Joint Manipulations    PT Next Visit Plan  Continue with strengthening and balance    PT Home Exercise Plan  Access  Code: [email or web address]    Consulted and Agree with Plan of Care  Patient       Patient will benefit from skilled therapeutic intervention in order to improve the following deficits and impairments:  Abnormal gait, Decreased activity tolerance, Decreased  balance, Decreased strength  Visit Diagnosis: Muscle weakness (generalized)  Unsteadiness on feet     Problem List Patient Active Problem List   Diagnosis Date Noted  . Chest pain 03/04/2019  . Anemia due to antineoplastic chemotherapy 03/03/2018  . Cough 03/03/2018  . Chemotherapy induced neutropenia () 01/22/2018  . B12 deficiency 01/01/2018  . Hypomagnesemia 12/21/2017  . Encounter for antineoplastic chemotherapy 12/21/2017  . Malignant neoplasm of kidney ([place])   . Urothelial cancer ([place]) 12/18/2017  . Malnutrition of moderate degree 12/18/2017  . Therapeutic opioid induced constipation   . Palliative care encounter   . Dehydration 12/17/2017  . Goals of care, counseling/discussion 12/13/2017  . Urothelial carcinoma of kidney, left ([place]) 12/07/2017  . Right inguinal hernia 07/17/2015  . Family history of colon cancer 07/17/2015  . Allergic rhinitis 11/12/2014  . Airway hyperreactivity 11/12/2014  . Atherosclerosis of coronary artery 11/12/2014  . Cheilitis 11/12/2014  . Narrowing of intervertebral disc space 11/12/2014  . Deflected nasal septum 11/12/2014  . HLD (hyperlipidemia) 11/12/2014  . BP (high blood pressure) 11/12/2014  . Adult hypothyroidism 11/12/2014  . Sleep apnea 11/12/2014  . Diabetes mellitus, type 2 ([place]) 11/12/2014  . Degenerative disc disease, lumbar 09/24/2012  . Furunculosis 04/23/2012   Phillips Grout PT, DPT, GCS  Rin Gorton 06/30/2019, 11:18 AM  [place] MAIN [facility] SERVICES [address] [place], [place], [address] Phone: [phone]   Fax:  [phone]  Name: MALAQUIAS MANCA MRN: [id number] Date of Birth: 1946-07-05

## 2019-07-02 ENCOUNTER — Ambulatory Visit: Payer: Medicare Other

## 2019-07-02 ENCOUNTER — Other Ambulatory Visit: Payer: Self-pay

## 2019-07-02 DIAGNOSIS — R2681 Unsteadiness on feet: Secondary | ICD-10-CM

## 2019-07-02 DIAGNOSIS — M6281 Muscle weakness (generalized): Secondary | ICD-10-CM | POA: Diagnosis not present

## 2019-07-02 NOTE — Therapy (Signed)
Patrick Mcgee [facility] SERVICES [address] [place], [place], [address] Phone: [phone]   Fax:  [phone]  Physical Therapy Treatment  Patient Details  Name: Patrick Mcgee MRN: [id number] Date of Birth: [birth year] Referring Provider (PT): Dr. Rosanna Randy   Encounter Date: 07/02/2019  PT End of Session - 07/02/19 1110    Visit Number  9    Number of Visits  25    Date for PT Re-Evaluation  08/25/19    PT Start Time  1018    PT Stop Time  1100    PT Time Calculation (min)  42 min    Equipment Utilized During Treatment  Gait belt    Activity Tolerance  Patient tolerated treatment well    Behavior During Therapy  [facility] for tasks assessed/performed       Past Medical History:  Diagnosis Date  . Acid reflux   . Anxiety   . Depression   . Diabetes mellitus without complication ([place])   . Hyperlipidemia   . Hypertension   . MRSA (methicillin resistant Staphylococcus aureus) infection 1992   history  . Myocardial infarction ([place]) 1995  . Sleep apnea    CPAP  . Urothelial cancer ([place]) 11/2017   Left Urothelial mass, chemo tx's    Past Surgical History:  Procedure Laterality Date  . CATARACT EXTRACTION Left   . COLONOSCOPY  2010   [facility]  . COLONOSCOPY WITH PROPOFOL N/A 10/04/2016   Procedure: COLONOSCOPY WITH PROPOFOL;  Surgeon: Patrick Mcgee;  Location: [facility] ENDOSCOPY;  Service: Endoscopy;  Laterality: N/A;  . [place], 2000, 2001, 2014  . CYSTOSCOPY W/ RETROGRADES Bilateral 12/10/2017   Procedure: CYSTOSCOPY WITH RETROGRADE PYELOGRAM;  Surgeon: Patrick Mcgee;  Location: [facility] ORS;  Service: Urology;  Laterality: Bilateral;  . CYSTOSCOPY W/ RETROGRADES Left 12/09/2018   Procedure: CYSTOSCOPY WITH RETROGRADE PYELOGRAM;  Surgeon: Patrick Mcgee;  Location: [facility] ORS;  Service: Urology;  Laterality: Left;  . CYSTOSCOPY WITH BIOPSY Left 12/09/2018   Procedure: CYSTOSCOPY WITH  URETERAL/RENAL PELVIC BIOPSY;  Surgeon: Patrick Mcgee;  Location: [facility] ORS;  Service: Urology;  Laterality: Left;  . CYSTOSCOPY WITH STENT PLACEMENT Left 12/10/2017   Procedure: CYSTOSCOPY WITH STENT PLACEMENT;  Surgeon: Patrick Mcgee;  Location: [facility] ORS;  Service: Urology;  Laterality: Left;  . CYSTOSCOPY WITH STENT PLACEMENT Left 12/09/2018   Procedure: CYSTOSCOPY WITH STENT PLACEMENT;  Surgeon: Patrick Mcgee;  Location: [facility] ORS;  Service: Urology;  Laterality: Left;  . CYSTOSCOPY WITH URETEROSCOPY Left 12/09/2018   Procedure: CYSTOSCOPY WITH URETEROSCOPY;  Surgeon: Patrick Mcgee;  Location: [facility] ORS;  Service: Urology;  Laterality: Left;  . EYE SURGERY Bilateral    cataract  . INGUINAL HERNIA REPAIR Right 08/09/2015   Procedure: HERNIA REPAIR INGUINAL ADULT;  Surgeon: Patrick Mcgee;  Location: [facility] ORS;  Service: General;  Laterality: Right;  . NASAL SINUS SURGERY    . nuclear stress test    . PORTA CATH INSERTION N/A 12/19/2017   Procedure: PORTA CATH INSERTION;  Surgeon: Patrick Mcgee;  Location: [place] CV LAB;  Service: Cardiovascular;  Laterality: N/A;  . URETERAL BIOPSY Left 12/10/2017   Procedure: URETERAL & renal PELVIS BIOPSY;  Surgeon: Patrick Mcgee;  Location: [facility] ORS;  Service: Urology;  Laterality: Left;  . URETEROSCOPY Left 12/10/2017   Procedure: URETEROSCOPY;  Surgeon: Patrick Mcgee;  Location: [facility] ORS;  Service: Urology;  Laterality: Left;  There were no vitals filed for this visit.  Subjective Assessment - 07/02/19 1025    Subjective  Pt doing well today. No falls since last session. Denies pain. No specific questions or concerns at this time.    Pertinent History  Part of history borrowed from oncology note and verified with patient: Patient is a 73 year old male who was referred for foot drop. He started with Dr. Mike Mcgee so far for his metastatic urothelial carcinoma.  This was originally diagnosed in May 2019. He was  started on chemotherapy in June 2019 and was continued on 05/27/2018 for 8 cycles.  He tolerated chemotherapy well except for chemo-induced anemia for which she has been getting Procrit every 2 weeks.  He did have response to his disease based on scans in August 2019.  However repeat scan on 05/31/2018 showed increase in the size of the primary tumor from 1.2 to 1.9 cm and increase in left para-aortic adenopathy from 0.9 to 1.3 cm.  Second line immunotherapy was recommended.  His initial biopsy specimen did not have enough sample to undergo FGFR mutation testing. He also has mild interstitial lung disease for which he sees pulmonary but he is not on home oxygen.  He has B12 deficiency for which he is on oral B12.  Also has diabetes and coronary artery disease. Patient noted to have disease progression in his lymph nodes in May 2020.  Repeat biopsy showed metastatic urothelial carcinoma which did not have a FGFR mutation.  He has been started on third line Padcev. There is still times when he feels fatigued but he is having more good days. He reports poor appetite. He was working with physical therapy on his foot drop, RLE weakness, and unsteadiness. He states that he gets considerable numbness for the 7-8 days following his chemotherapy treatment. He states that he is a diabetic and did have some numbness prior to starting chemotherapy. He states that he has a history of a low back disc bulge with RLE radicular symptoms. He used to get spinal injections but reports he hasn't had any injections in the last 10 years.    Currently in Pain?  No/denies          TREATMENT   Ther-ex Xride warm-up with UE x 5 minutes, 45s intervals alternating between L4and L8, fatigue and challenge monitored throughout; Quantum single leg press90#R:x 7, x 5; L: x 10, x 7,  BOSU (round side up) forward lunges without UE support x 10 leading with each leg; BOSU (round side up) squats without UE support x 10; Matrix  resisted gait 15# forward, retro, R lateral, and L lateral x 3 each direction; TRX reverse lunges extensive cues required for proper form with hip hinge and to avoid knees crossing toe line however improved from prior session utilized x 10 each direction; TRX lateral lunges x 10 each direction; TRX rows x 10, extensive cues from therapist; Matrix tricep pull downs 17.5# x 10;   Neuromuscular Re-education BOSU (round side up) balance without UE support x 30s; Airex balance beam tandem gait x 6 lengths; Airex balance beam side stepping x 6 lengths;   Pt educated throughout session about proper posture and technique with exercises. Improved exercise technique, movement at target joints, use of target muscles after min to mod verbal, visual, tactile cues.    Pt demonstrates excellent motivation today. He is able to increase his weight on the leg press today but with decreased repetitions. Repeated Matrix walk-outs with higher weight as well  as TRX reverse and lateral lunges. R ankle eversion weakness noted with Airex tandem walking.Pt encouraged to continue HEP. Hewill benefit from PT services to address deficits in strength, balance, and mobility in order to return to full function at home.                       PT Short Term Goals - 06/02/19 1242      PT SHORT TERM GOAL #1   Title  Pt will be independent with HEP in order to improve strength and balance in order to decrease fall risk and improve function at home    Time  6    Period  Weeks    Status  New    Target Date  07/14/19        PT Long Term Goals - 06/02/19 1243      PT LONG TERM GOAL #1   Title  Pt will improve BERG by at least 3 points in order to demonstrate clinically significant improvement in balance.    Baseline  06/02/19: 48/56    Time  12    Period  Weeks    Status  New    Target Date  08/25/19      PT LONG TERM GOAL #2   Title  Pt will improve ABC by at least 13% in order to  demonstrate clinically significant improvement in balance confidence.    Baseline  06/02/19: 64.4%    Time  12    Period  Weeks    Status  New    Target Date  08/25/19      PT LONG TERM GOAL #3   Title  Pt will increase 30s Sit to Stand Test to greater than or equal to 14 repetitions in order to demonstrate clinically significant improvement in LE endurance and decrease in fall risk.    Baseline  06/02/19: 10 repetitions    Time  12    Period  Weeks    Status  New    Target Date  08/25/19            Plan - 07/02/19 1111    Clinical Impression Statement  Pt demonstrates excellent motivation today. He is able to increase his weight on the leg press today but with decreased repetitions. Repeated Matrix walk-outs with higher weight as well as TRX reverse and lateral lunges. R ankle eversion weakness noted with Airex tandem walking. Pt encouraged to continue HEP. He will benefit from PT services to address deficits in strength, balance, and mobility in order to return to full function at home.    Personal Factors and Comorbidities  Age;Comorbidity 3+;Time since onset of injury/illness/exacerbation    Comorbidities  Urothelial cancer, DM, previous lumbar disc bulge, interstitial pulmonary disease    Examination-Activity Limitations  Locomotion Level;Stairs;Transfers    Examination-Participation Restrictions  Community Activity;Laundry;Shop    Stability/Clinical Decision Making  Evolving/Moderate complexity    Rehab Potential  Fair    PT Frequency  2x / week    PT Duration  12 weeks    PT Treatment/Interventions  ADLs/Self Care Home Management;Aquatic Therapy;Biofeedback;Canalith Repostioning;Cryotherapy;Iontophoresis 4mg /ml Dexamethasone;Moist Heat;Traction;Ultrasound;DME Instruction;Gait training;Stair training;Functional mobility training;Therapeutic activities;Therapeutic exercise;Balance training;Neuromuscular re-education;Patient/family education;Orthotic Fit/Training;Manual  techniques;Dry needling;Energy conservation;Vestibular;Joint Manipulations    PT Next Visit Plan  Update outcome measures, goals, and progress note; Continue with strengthening and balance    PT Home Exercise Plan  Access Code: [email or web address]    Consulted and Agree with Plan of Care  Patient  Patient will benefit from skilled therapeutic intervention in order to improve the following deficits and impairments:  Abnormal gait, Decreased activity tolerance, Decreased balance, Decreased strength  Visit Diagnosis: Muscle weakness (generalized)  Unsteadiness on feet     Problem List Patient Active Problem List   Diagnosis Date Noted  . Chest pain 03/04/2019  . Anemia due to antineoplastic chemotherapy 03/03/2018  . Cough 03/03/2018  . Chemotherapy induced neutropenia ([place]) 01/22/2018  . B12 deficiency 01/01/2018  . Hypomagnesemia 12/21/2017  . Encounter for antineoplastic chemotherapy 12/21/2017  . Malignant neoplasm of kidney ([place])   . Urothelial cancer ([place]) 12/18/2017  . Malnutrition of moderate degree 12/18/2017  . Therapeutic opioid induced constipation   . Palliative care encounter   . Dehydration 12/17/2017  . Goals of care, counseling/discussion 12/13/2017  . Urothelial carcinoma of kidney, left ([place]) 12/07/2017  . Right inguinal hernia 07/17/2015  . Family history of colon cancer 07/17/2015  . Allergic rhinitis 11/12/2014  . Airway hyperreactivity 11/12/2014  . Atherosclerosis of coronary artery 11/12/2014  . Cheilitis 11/12/2014  . Narrowing of intervertebral disc space 11/12/2014  . Deflected nasal septum 11/12/2014  . HLD (hyperlipidemia) 11/12/2014  . BP (high blood pressure) 11/12/2014  . Adult hypothyroidism 11/12/2014  . Sleep apnea 11/12/2014  . Diabetes mellitus, type 2 ([place]) 11/12/2014  . Degenerative disc disease, lumbar 09/24/2012  . Furunculosis 04/23/2012   Phillips Grout PT, DPT, GCS  Kandance Yano 07/02/2019, 11:17 AM  [facility] SERVICES [address] [place], [place], [address] Phone: [phone]   Fax:  [phone]  Name: CHAVON HOH MRN: [id number] Date of Birth: 04-09-1946

## 2019-07-07 ENCOUNTER — Other Ambulatory Visit: Payer: Self-pay

## 2019-07-07 ENCOUNTER — Ambulatory Visit: Payer: Medicare Other

## 2019-07-07 VITALS — BP 118/74 | HR 97

## 2019-07-07 DIAGNOSIS — R2681 Unsteadiness on feet: Secondary | ICD-10-CM

## 2019-07-07 DIAGNOSIS — M6281 Muscle weakness (generalized): Secondary | ICD-10-CM

## 2019-07-07 NOTE — Therapy (Signed)
[place] MAIN [facility] SERVICES [address] [place], [place], [address] Phone: [phone]   Fax:  [phone]  Physical Therapy Progress Note   Dates of reporting period  06/02/19   to   07/07/19  Patient Details  Name: Patrick Mcgee MRN: [id number] Date of Birth: 1946-01-28 Referring Provider (PT): Dr. Rosanna Randy   Encounter Date: 07/07/2019  PT End of Session - 07/07/19 1025    Visit Number  10    Number of Visits  25    Date for PT Re-Evaluation  08/25/19    PT Start Time  1018    PT Stop Time  1100    PT Time Calculation (min)  42 min    Equipment Utilized During Treatment  Gait belt    Activity Tolerance  Patient tolerated treatment well    Behavior During Therapy  [facility] for tasks assessed/performed       Past Medical History:  Diagnosis Date  . Acid reflux   . Anxiety   . Depression   . Diabetes mellitus without complication ([place])   . Hyperlipidemia   . Hypertension   . MRSA (methicillin resistant Staphylococcus aureus) infection 1992   history  . Myocardial infarction ([place] Mcge[place]) 1995  . Sleep apnea    CPAP  . Urothelial cancer ([place]) 11/2017   Left Urothelial mass, chemo tx's    Past Surgical History:  Procedure Laterality Date  . CATARACT EXTRACTION Left   . COLONOSCOPY  2010   [facility]  . COLONOSCOPY WITH PROPOFOL N/A 10/04/2016   Procedure: COLONOSCOPY WITH PROPOFOL;  Surgeon: [name], MD;  Location: [facility] ENDOSCOPY;  Service: Endoscopy;  Laterality: N/A;  . [place], 2000, 2001, 2014  . CYSTOSCOPY W/ RETROGRADES Bilateral 12/10/2017   Procedure: CYSTOSCOPY WITH RETROGRADE PYELOGRAM;  Surgeon: [name], MD;  Location: [facility] ORS;  Service: Urology;  Laterality: Bilateral;  . CYSTOSCOPY W/ RETROGRADES Left 12/09/2018   Procedure: CYSTOSCOPY WITH RETROGRADE PYELOGRAM;  Surgeon: [name], MD;  Location: [facility] ORS;  Service: Urology;  Laterality: Left;  . CYSTOSCOPY  WITH BIOPSY Left 12/09/2018   Procedure: CYSTOSCOPY WITH URETERAL/RENAL PELVIC BIOPSY;  Surgeon: [name], MD;  Location: [facility] ORS;  Service: Urology;  Laterality: Left;  . CYSTOSCOPY WITH STENT PLACEMENT Left 12/10/2017   Procedure: CYSTOSCOPY WITH STENT PLACEMENT;  Surgeon: [name], MD;  Location: [facility] ORS;  Service: Urology;  Laterality: Left;  . CYSTOSCOPY WITH STENT PLACEMENT Left 12/09/2018   Procedure: CYSTOSCOPY WITH STENT PLACEMENT;  Surgeon: [name], MD;  Location: [facility] ORS;  Service: Urology;  Laterality: Left;  . CYSTOSCOPY WITH URETEROSCOPY Left 12/09/2018   Procedure: CYSTOSCOPY WITH URETEROSCOPY;  Surgeon: [name], MD;  Location: [facility] ORS;  Service: Urology;  Laterality: Left;  . EYE SURGERY Bilateral    cataract  . INGUINAL HERNIA REPAIR Right 08/09/2015   Procedure: HERNIA REPAIR INGUINAL ADULT;  Surgeon: [name], MD;  Location: [facility] ORS;  Service: General;  Laterality: Right;  . NASAL SINUS SURGERY    . nuclear stress test    . PORTA CATH INSERTION N/A 12/19/2017   Procedure: PORTA CATH INSERTION;  Surgeon: [name], MD;  Location: [place] CV LAB;  Service: Cardiovascular;  Laterality: N/A;  . URETERAL BIOPSY Left 12/10/2017   Procedure: URETERAL & renal PELVIS BIOPSY;  Surgeon: [name], MD;  Location: [facility] ORS;  Service: Urology;  Laterality: Left;  . URETEROSCOPY Left 12/10/2017   Procedure: URETEROSCOPY;  Surgeon: [name], MD;  Location: [facility] ORS;  Service: Urology;  Laterality: Left;    Vitals:   07/07/19 1045  BP: 118/74  Pulse: 97  SpO2: 99%    Subjective Assessment - 07/07/19 1025    Subjective  Pt doing well today. No falls since last session. Denies pain. No specific questions or concerns at this time.    Pertinent History  Part of history borrowed from oncology note and verified with patient: Patient is a 73 year old male who was referred for foot drop. He started with Dr. [name] so far for his  metastatic urothelial carcinoma.  This was originally diagnosed in May 2019. He was started on chemotherapy in June 2019 and was continued on 05/27/2018 for 8 cycles.  He tolerated chemotherapy well except for chemo-induced anemia for which she has been getting Procrit every 2 weeks.  He did have response to his disease based on scans in August 2019.  However repeat scan on 05/31/2018 showed increase in the size of the primary tumor from 1.2 to 1.9 cm and increase in left para-aortic adenopathy from 0.9 to 1.3 cm.  Second line immunotherapy was recommended.  His initial biopsy specimen did not have enough sample to undergo FGFR mutation testing. He also has mild interstitial lung disease for which he sees pulmonary but he is not on home oxygen.  He has B12 deficiency for which he is on oral B12.  Also has diabetes and coronary artery disease. Patient noted to have disease progression in his lymph nodes in May 2020.  Repeat biopsy showed metastatic urothelial carcinoma which did not have a FGFR mutation.  He has been started on third line Padcev. There is still times when he feels fatigued but he is having more good days. He reports poor appetite. He was working with physical therapy on his foot drop, RLE weakness, and unsteadiness. He states that he gets considerable numbness for the 7-8 days following his chemotherapy treatment. He states that he is a diabetic and did have some numbness prior to starting chemotherapy. He states that he has a history of a low back disc bulge with RLE radicular symptoms. He used to get spinal injections but reports he hasn't had any injections in the last 10 years.    Currently in Pain?  No/denies         [facility] PT Assessment - 07/07/19 1041      6 Minute Walk- Baseline   6 Minute Walk- Baseline  yes    BP (mmHg)  118/74    HR (bpm)  97    02 Sat (%RA)  99 %    Modified Borg Scale for Dyspnea  0- Nothing at all    Perceived Rate of Exertion (Borg)  6-      6 Minute  walk- Post Test   6 Minute Walk Post Test  --      6 minute walk test results    Endurance additional comments  Test terminated secondary to fall after approximately 1:15       Standardized Balance Assessment   Standardized Balance Assessment  Berg Balance Test      Berg Balance Test   Sit to Stand  Able to stand without using hands and stabilize independently    Standing Unsupported  Able to stand safely 2 minutes    Sitting with Back Unsupported but Feet Supported on Floor or Stool  Able to sit safely and securely 2 minutes    Stand to Sit  Sits safely with minimal use of hands    Transfers  Able to transfer safely, minor use of hands    Standing Unsupported with Eyes Closed  Able to stand 10 seconds safely    Standing Unsupported with Feet Together  Able to place feet together independently and stand 1 minute safely    From Standing, Reach Forward with Outstretched Arm  Can reach confidently >25 cm (10")    From Standing Position, Pick up Object from Floor  Able to pick up shoe safely and easily    From Standing Position, Turn to Look Behind Over each Shoulder  Looks behind from both sides and weight shifts well    Turn 360 Degrees  Able to turn 360 degrees safely in 4 seconds or less    Standing Unsupported, Alternately Place Feet on Step/Stool  Able to stand independently and safely and complete 8 steps in 20 seconds    Standing Unsupported, One Foot in Front  Able to plae foot ahead of the other independently and hold 30 seconds    Standing on One Leg  Able to lift leg independently and hold 5-10 seconds    Total Score  54       TREATMENT   Ther-ex  Updated outcome measures with patient including: ABC: 65% BERG: 54/56; 30s Sit to Stand Test: 13 reps 6MWT: pt fell at the 1:15 minute mark, caught his R foot and fell forward, occurred at 10:53AM. Therapist was ambulating with patient however not holding onto gait belt so as not to influence test as he was walking quickly and  had already completed almost 300' in 1:15. Pt fell to both knees and then onto hands. Did not strike head. Pt assessed on the ground without signs of injury. Pt assured therapist that he was uninjured. He was assisted to standing and then ambulated back to rehab gym. Test terminated. Pt assessed in rehab gym with knee, hip, and ankle AROM/PROM without any pain. UE screening also performed without any pain or deficits identified. Pt has a small abrasion on his L knee from carpet. Therapist applied 2x2 gauze pad and coban dressing. Pt educated about signs/symptoms of infection and encouraged to monitor and notify PCP if he has any concerns or if he develops any pain from fall. Pt is apologetic however therapist provided reassurance. Pt would like to attempt test again at next session.   Sit to stand x 10 without UE support; Seated clams with manual resistance x 10; Seated adductor squeeze with manual resistance x 10; Seated heel raises with manual resistance x 10;   Pt educated throughout session about proper posture and technique with exercises. Improved exercise technique, movement at target joints, use of target muscles after min to mod verbal, visual, tactile cues.    Outcome measures and goals updated with patient today. Pt demonstrates significant improvement in his balance with increase in his BERG test from 48/56 to 54/56 today. His ABC is essentially unchanged at 65% however his reps during the 30s Sit to Stand Test have increased from 10 to 13 indicating improvement in his LE strength and endurance. Attempted Six Minute Walk Test again today however pt caught his R foot and fell forward at the 1:15 mark. Therapist was ambulating with patient however not holding onto gait belt so as not to influence test as he was walking quickly and had already completed almost 300' in 1:15. Pt fell to both knees and then onto hands. Did not strike head. Pt assessed  on the ground without signs of injury. Pt assured  therapist that he was uninjured. He was assisted to standing and then ambulated back to rehab gym. Test terminated. Pt assessed in rehab gym with knee, hip, and ankle AROM/PROM without any pain. UE screening also performed without any pain or deficits identified. He ambulates without any pain. Pt has a small abrasion on his L knee from carpet. Therapist applied 2x2 gauze pad and coban dressing. Pt educated about signs/symptoms of infection and encouraged to monitor and notify PCP if he has any concerns or if he develops any pain from fall. Pt is apologetic however therapist provided reassurance. Pt would like to attempt test again at next session. Pt is able to complete additional exercises during session without any reports of pain. Pt is making excellent progress and will benefit from PT services to address deficits in strength, balance, and mobility in order to return to full function at home.                        PT Short Term Goals - 07/07/19 1026      PT SHORT TERM GOAL #1   Title  Pt will be independent with HEP in order to improve strength and balance in order to decrease fall risk and improve function at home    Time  6    Period  Weeks    Status  On-going    Target Date  07/14/19        PT Long Term Goals - 07/07/19 1026      PT LONG TERM GOAL #1   Title  Pt will improve BERG by at least 3 points in order to demonstrate clinically significant improvement in balance.    Baseline  06/02/19: 48/56; 07/08/19: 54/56    Time  12    Period  Weeks    Status  Achieved      PT LONG TERM GOAL #2   Title  Pt will improve ABC by at least 13% in order to demonstrate clinically significant improvement in balance confidence.    Baseline  06/02/19: 64.4%; 07/07/19: 65%    Time  12    Period  Weeks    Status  On-going    Target Date  08/25/19      PT LONG TERM GOAL #3   Title  Pt will increase 30s Sit to Stand Test to greater than or equal to 14 repetitions in order to  demonstrate clinically significant improvement in LE endurance and decrease in fall risk.    Baseline  06/02/19: 10 repetitions; 07/07/19: 13 repetitions    Time  12    Period  Weeks    Status  Partially Met    Target Date  08/25/19      PT LONG TERM GOAL #4   Title  Pt will increase 6MWT by at least 103m(1643f in order to demonstrate clinically significant improvement in cardiopulmonary endurance and community ambulation    Baseline  06/10/20: [id number] AD, no LOB, Rt foot slap without flat foot strike; 07/07/19: test terminated 2/2 fall    Time  12    Period  Weeks    Status  New    Target Date  08/25/19            Plan - 07/07/19 1026    Clinical Impression Statement  Outcome measures and goals updated with patient today. Pt demonstrates significant improvement in his balance with increase  in his BERG test from 48/56 to 54/56 today. His ABC is essentially unchanged at 65% however his reps during the 30s Sit to Stand Test have increased from 10 to 13 indicating improvement in his LE strength and endurance. Attempted Six Minute Walk Test again today however pt caught his R foot and fell forward at the 1:15 mark. Therapist was ambulating with patient however not holding onto gait belt so as not to influence test as he was walking quickly and had already completed almost 300' in 1:15. Pt fell to both knees and then onto hands. Did not strike head. Pt assessed on the ground without signs of injury. Pt assured therapist that he was uninjured. He was assisted to standing and then ambulated back to rehab gym. Test terminated. Pt assessed in rehab gym with knee, hip, and ankle AROM/PROM without any pain. UE screening also performed without any pain or deficits identified. He ambulates without any pain. Pt has a small abrasion on his L knee from carpet. Therapist applied 2x2 gauze pad and coban dressing. Pt educated about signs/symptoms of infection and encouraged to monitor and notify PCP if he has  any concerns or if he develops any pain from fall. Pt is apologetic however therapist provided reassurance. Pt would like to attempt test again at next session. Pt is able to complete additional exercises during session without any reports of pain. Safety zone portal completed by therapist to report fall. Pt is making excellent progress and will benefit from PT services to address deficits in strength, balance, and mobility in order to return to full function at home.    Personal Factors and Comorbidities  Age;Comorbidity 3+;Time since onset of injury/illness/exacerbation    Comorbidities  Urothelial cancer, DM, previous lumbar disc bulge, interstitial pulmonary disease    Examination-Activity Limitations  Locomotion Level;Stairs;Transfers    Examination-Participation Restrictions  Community Activity;Laundry;Shop    Stability/Clinical Decision Making  Evolving/Moderate complexity    Rehab Potential  Fair    PT Frequency  2x / week    PT Duration  12 weeks    PT Treatment/Interventions  ADLs/Self Care Home Management;Aquatic Therapy;Biofeedback;Canalith Repostioning;Cryotherapy;Iontophoresis '4mg'$ /ml Dexamethasone;Moist Heat;Traction;Ultrasound;DME Instruction;Gait training;Stair training;Functional mobility training;Therapeutic activities;Therapeutic exercise;Balance training;Neuromuscular re-education;Patient/family education;Orthotic Fit/Training;Manual techniques;Dry needling;Energy conservation;Vestibular;Joint Manipulations    PT Next Visit Plan  6MWT, consider using AFO but definitely hold onto gait belt. Continue with strengthening and balance    PT Home Exercise Plan  Access Code: [email or web address]    Consulted and Agree with Plan of Care  Patient       Patient will benefit from skilled therapeutic intervention in order to improve the following deficits and impairments:  Abnormal gait, Decreased activity tolerance, Decreased balance, Decreased strength  Visit Diagnosis: Muscle weakness  (generalized)  Unsteadiness on feet     Problem List Patient Active Problem List   Diagnosis Date Noted  . Chest pain 03/04/2019  . Anemia due to antineoplastic chemotherapy 03/03/2018  . Cough 03/03/2018  . Chemotherapy induced neutropenia ([place]) 01/22/2018  . B12 deficiency 01/01/2018  . Hypomagnesemia 12/21/2017  . Encounter for antineoplastic chemotherapy 12/21/2017  . Malignant neoplasm of kidney ([place])   . Urothelial cancer ([place]) 12/18/2017  . Malnutrition of moderate degree 12/18/2017  . Therapeutic opioid induced constipation   . Palliative care encounter   . Dehydration 12/17/2017  . Goals of care, counseling/discussion 12/13/2017  . Urothelial carcinoma of kidney, left ([place]) 12/07/2017  . Right inguinal hernia 07/17/2015  . Family history of colon cancer 07/17/2015  . Allergic rhinitis  11/12/2014  . Airway hyperreactivity 11/12/2014  . Atherosclerosis of coronary artery 11/12/2014  . Cheilitis 11/12/2014  . Narrowing of intervertebral disc space 11/12/2014  . Deflected nasal septum 11/12/2014  . HLD (hyperlipidemia) 11/12/2014  . BP (high blood pressure) 11/12/2014  . Adult hypothyroidism 11/12/2014  . Sleep apnea 11/12/2014  . Diabetes mellitus, type 2 ([place]) 11/12/2014  . Degenerative disc disease, lumbar 09/24/2012  . Furunculosis 04/23/2012   Phillips Grout PT, DPT, GCS  Baila Rouse 07/08/2019, 8:23 AM  [place] MAIN [facility] SERVICES [address] [place], [place], [address] Phone: [phone]   Fax:  [phone]  Name: IAIN SAWCHUK MRN: [id number] Date of Birth: Mar 24, 1946

## 2019-07-08 ENCOUNTER — Ambulatory Visit
Admission: RE | Admit: 2019-07-08 | Discharge: 2019-07-08 | Disposition: A | Discharge status: Home / Self Care | Payer: Medicare Other | Source: Ambulatory Visit | Attending: Oncology | Admitting: Oncology

## 2019-07-08 ENCOUNTER — Other Ambulatory Visit: Payer: Self-pay

## 2019-07-08 DIAGNOSIS — C642 Malignant neoplasm of left kidney, except renal pelvis: Secondary | ICD-10-CM | POA: Insufficient documentation

## 2019-07-08 MED ORDER — IOHEXOL 300 MG/ML  SOLN
100.0000 mL | Freq: Once | INTRAMUSCULAR | Status: AC | PRN
  Administered 2019-07-08: 100 mL via INTRAVENOUS

## 2019-07-09 ENCOUNTER — Ambulatory Visit: Payer: Medicare Other

## 2019-07-09 VITALS — BP 114/70 | HR 81

## 2019-07-09 DIAGNOSIS — M6281 Muscle weakness (generalized): Secondary | ICD-10-CM | POA: Diagnosis not present

## 2019-07-09 DIAGNOSIS — R2681 Unsteadiness on feet: Secondary | ICD-10-CM

## 2019-07-09 NOTE — Therapy (Signed)
[place] MAIN [facility] SERVICES [address] [place], [place], [address] Phone: [phone]   Fax:  [phone]  Physical Therapy Treatment  Patient Details  Name: Patrick Mcgee MRN: [id number] Date of Birth: 01/16/1946 Referring Provider (PT): Dr. Rosanna Randy   Encounter Date: 07/09/2019  PT End of Session - 07/09/19 1226    Visit Number  11    Number of Visits  25    Date for PT Re-Evaluation  08/25/19    PT Start Time  1020    PT Stop Time  1110    PT Time Calculation (min)  50 min    Equipment Utilized During Treatment  Gait belt    Activity Tolerance  Patient tolerated treatment well    Behavior During Therapy  [facility] for tasks assessed/performed       Past Medical History:  Diagnosis Date  . Acid reflux   . Anxiety   . Depression   . Diabetes mellitus without complication ([place])   . Hyperlipidemia   . Hypertension   . MRSA (methicillin resistant Staphylococcus aureus) infection 1992   history  . Myocardial infarction ([place]) 1995  . Sleep apnea    CPAP  . Urothelial cancer ([place]) 11/2017   Left Urothelial mass, chemo tx's    Past Surgical History:  Procedure Laterality Date  . CATARACT EXTRACTION Left   . COLONOSCOPY  2010   [facility]  . COLONOSCOPY WITH PROPOFOL N/A 10/04/2016   Procedure: COLONOSCOPY WITH PROPOFOL;  Surgeon: [name], MD;  Location: [facility] ENDOSCOPY;  Service: Endoscopy;  Laterality: N/A;  . [place], 2000, 2001, 2014  . CYSTOSCOPY W/ RETROGRADES Bilateral 12/10/2017   Procedure: CYSTOSCOPY WITH RETROGRADE PYELOGRAM;  Surgeon: [name], MD;  Location: [facility] ORS;  Service: Urology;  Laterality: Bilateral;  . CYSTOSCOPY W/ RETROGRADES Left 12/09/2018   Procedure: CYSTOSCOPY WITH RETROGRADE PYELOGRAM;  Surgeon: [name], MD;  Location: [facility] ORS;  Service: Urology;  Laterality: Left;  . CYSTOSCOPY WITH BIOPSY Left 12/09/2018   Procedure: CYSTOSCOPY WITH  URETERAL/RENAL PELVIC BIOPSY;  Surgeon: [name], MD;  Location: [facility] ORS;  Service: Urology;  Laterality: Left;  . CYSTOSCOPY WITH STENT PLACEMENT Left 12/10/2017   Procedure: CYSTOSCOPY WITH STENT PLACEMENT;  Surgeon: [name], MD;  Location: [facility] ORS;  Service: Urology;  Laterality: Left;  . CYSTOSCOPY WITH STENT PLACEMENT Left 12/09/2018   Procedure: CYSTOSCOPY WITH STENT PLACEMENT;  Surgeon: [name], MD;  Location: [facility] ORS;  Service: Urology;  Laterality: Left;  . CYSTOSCOPY WITH URETEROSCOPY Left 12/09/2018   Procedure: CYSTOSCOPY WITH URETEROSCOPY;  Surgeon: [name], MD;  Location: [facility] ORS;  Service: Urology;  Laterality: Left;  . EYE SURGERY Bilateral    cataract  . INGUINAL HERNIA REPAIR Right 08/09/2015   Procedure: HERNIA REPAIR INGUINAL ADULT;  Surgeon: [name], MD;  Location: [facility] ORS;  Service: General;  Laterality: Right;  . NASAL SINUS SURGERY    . nuclear stress test    . PORTA CATH INSERTION N/A 12/19/2017   Procedure: PORTA CATH INSERTION;  Surgeon: [name], MD;  Location: [place] CV LAB;  Service: Cardiovascular;  Laterality: N/A;  . URETERAL BIOPSY Left 12/10/2017   Procedure: URETERAL & renal PELVIS BIOPSY;  Surgeon: [name], MD;  Location: [facility] ORS;  Service: Urology;  Laterality: Left;  . URETEROSCOPY Left 12/10/2017   Procedure: URETEROSCOPY;  Surgeon: [name], MD;  Location: [facility] ORS;  Service: Urology;  Laterality: Left;  Vitals:   07/09/19 1026  BP: 114/70  Pulse: 81  SpO2: 96%    Subjective Assessment - 07/09/19 1026    Subjective  Pt doing well today. No falls since last session. Denies pain and no issues after his fall during last visit. No specific questions or concerns at this time.    Pertinent History  Part of history borrowed from oncology note and verified with patient: Patient is a 73 year old male who was referred for foot drop. He started with Dr. [name] so far for his metastatic  urothelial carcinoma.  This was originally diagnosed in May 2019. He was started on chemotherapy in June 2019 and was continued on 05/27/2018 for 8 cycles.  He tolerated chemotherapy well except for chemo-induced anemia for which she has been getting Procrit every 2 weeks.  He did have response to his disease based on scans in August 2019.  However repeat scan on 05/31/2018 showed increase in the size of the primary tumor from 1.2 to 1.9 cm and increase in left para-aortic adenopathy from 0.9 to 1.3 cm.  Second line immunotherapy was recommended.  His initial biopsy specimen did not have enough sample to undergo FGFR mutation testing. He also has mild interstitial lung disease for which he sees pulmonary but he is not on home oxygen.  He has B12 deficiency for which he is on oral B12.  Also has diabetes and coronary artery disease. Patient noted to have disease progression in his lymph nodes in May 2020.  Repeat biopsy showed metastatic urothelial carcinoma which did not have a FGFR mutation.  He has been started on third line Padcev. There is still times when he feels fatigued but he is having more good days. He reports poor appetite. He was working with physical therapy on his foot drop, RLE weakness, and unsteadiness. He states that he gets considerable numbness for the 7-8 days following his chemotherapy treatment. He states that he is a diabetic and did have some numbness prior to starting chemotherapy. He states that he has a history of a low back disc bulge with RLE radicular symptoms. He used to get spinal injections but reports he hasn't had any injections in the last 10 years.    Currently in Pain?  No/denies         [facility] PT Assessment - 07/09/19 1043      6 Minute Walk- Baseline   6 Minute Walk- Baseline  yes    BP (mmHg)  114/70    HR (bpm)  96    02 Sat (%RA)  81 %    Modified Borg Scale for Dyspnea  0- Nothing at all    Perceived Rate of Exertion (Borg)  6-      6 Minute walk- Post  Test   6 Minute Walk Post Test  yes    BP (mmHg)  160/86    HR (bpm)  96    02 Sat (%RA)  100 %    Modified Borg Scale for Dyspnea  5- Strong or hard breathing    Perceived Rate of Exertion (Borg)  14-      6 minute walk test results    Aerobic Endurance Distance Walked  1565    Endurance additional comments  no assistive device, AFO on RLE, mask donned         TREATMENT   Ther-ex 6MWT: 1565' with no assistive device, AFO on RLE, mask donned; (1362f) TRX squats 2 x 10 with cues for proper hip hinge;  TRX reverse lunges, extensive cues required for proper form with hip hinge and to avoid knees crossing toe linehowever improved from prior session x 10 each direction; BOSU (round side up) forward lunges without UE support x 10 leading with each leg; 5" step-ups with 2" Airex pad on top alternating leading LE x 10 each side; TRX rows x 10, cues from therapist and CGA provided;   Pt educated throughout session about proper posture and technique with exercises. Improved exercise technique, movement at target joints, use of target muscles after min to mod verbal, visual, tactile cues.    Pt demonstrates excellent motivation today. He is able to perform 6MWT today and was able to ambulate 1565.' Utilized AFO to improve safety and decrease risk for falls with R foot drop. CGA utilized with gait belt by therapist. His 6MWT distance improved from 1375' at initial evaluation. He is able to complete additional strengthening with therapist including step-ups and TRX squats.Pt encouraged to continue HEP. Hewill benefit from PT services to address deficits in strength, balance, and mobility in order to return to full function at home.                       PT Short Term Goals - 07/07/19 1026      PT SHORT TERM GOAL #1   Title  Pt will be independent with HEP in order to improve strength and balance in order to decrease fall risk and improve function at home    Time   6    Period  Weeks    Status  On-going    Target Date  07/14/19        PT Long Term Goals - 07/09/19 1236      PT LONG TERM GOAL #1   Title  Pt will improve BERG by at least 3 points in order to demonstrate clinically significant improvement in balance.    Baseline  06/02/19: 48/56; 07/08/19: 54/56    Time  12    Period  Weeks    Status  Achieved      PT LONG TERM GOAL #2   Title  Pt will improve ABC by at least 13% in order to demonstrate clinically significant improvement in balance confidence.    Baseline  06/02/19: 64.4%; 07/07/19: 65%    Time  12    Period  Weeks    Status  On-going    Target Date  08/25/19      PT LONG TERM GOAL #3   Title  Pt will increase 30s Sit to Stand Test to greater than or equal to 14 repetitions in order to demonstrate clinically significant improvement in LE endurance and decrease in fall risk.    Baseline  06/02/19: 10 repetitions; 07/07/19: 13 repetitions    Time  12    Period  Weeks    Status  Partially Met    Target Date  08/25/19      PT LONG TERM GOAL #4   Title  Pt will increase 6MWT by at least 143m(3211f in order to demonstrate clinically significant improvement in cardiopulmonary endurance and community ambulation    Baseline  06/10/20: 137532fno AD, no LOB, Rt foot slap without flat foot strike; 07/07/19: test terminated 2/2 fall; 07/09/19: 1565' with R AFO, no assistive device, mask donned;    Time  12    Period  Weeks    Status  Revised    Target Date  08/25/19  Plan - 07/09/19 1227    Clinical Impression Statement  Pt demonstrates excellent motivation today. He is able to perform 6MWT today and was able to ambulate 1565.' Utilized AFO to improve safety and decrease risk for falls with R foot drop. CGA utilized with gait belt by therapist. His 6MWT distance improved from 1375' at initial evaluation. He is able to complete additional strengthening with therapist including step-ups and TRX squats. Pt encouraged  to continue HEP. He will benefit from PT services to address deficits in strength, balance, and mobility in order to return to full function at home.    Personal Factors and Comorbidities  Age;Comorbidity 3+;Time since onset of injury/illness/exacerbation    Comorbidities  Urothelial cancer, DM, previous lumbar disc bulge, interstitial pulmonary disease    Examination-Activity Limitations  Locomotion Level;Stairs;Transfers    Examination-Participation Restrictions  Community Activity;Laundry;Shop    Stability/Clinical Decision Making  Evolving/Moderate complexity    Rehab Potential  Fair    PT Frequency  2x / week    PT Duration  12 weeks    PT Treatment/Interventions  ADLs/Self Care Home Management;Aquatic Therapy;Biofeedback;Canalith Repostioning;Cryotherapy;Iontophoresis 37m/ml Dexamethasone;Moist Heat;Traction;Ultrasound;DME Instruction;Gait training;Stair training;Functional mobility training;Therapeutic activities;Therapeutic exercise;Balance training;Neuromuscular re-education;Patient/family education;Orthotic Fit/Training;Manual techniques;Dry needling;Energy conservation;Vestibular;Joint Manipulations    PT Next Visit Plan  Continue with strengthening and balance    PT Home Exercise Plan  Access Code: [email or web address]   Consulted and Agree with Plan of Care  Patient       Patient will benefit from skilled therapeutic intervention in order to improve the following deficits and impairments:  Abnormal gait, Decreased activity tolerance, Decreased balance, Decreased strength  Visit Diagnosis: Muscle weakness (generalized)  Unsteadiness on feet     Problem List Patient Active Problem List   Diagnosis Date Noted  . Chest pain 03/04/2019  . Anemia due to antineoplastic chemotherapy 03/03/2018  . Cough 03/03/2018  . Chemotherapy induced neutropenia ([place] 01/22/2018  . B12 deficiency 01/01/2018  . Hypomagnesemia 12/21/2017  . Encounter for antineoplastic chemotherapy 12/21/2017  .  Malignant neoplasm of kidney ([place]   . Urothelial cancer ([place] 12/18/2017  . Malnutrition of moderate degree 12/18/2017  . Therapeutic opioid induced constipation   . Palliative care encounter   . Dehydration 12/17/2017  . Goals of care, counseling/discussion 12/13/2017  . Urothelial carcinoma of kidney, left ([place] 12/07/2017  . Right inguinal hernia 07/17/2015  . Family history of colon cancer 07/17/2015  . Allergic rhinitis 11/12/2014  . Airway hyperreactivity 11/12/2014  . Atherosclerosis of coronary artery 11/12/2014  . Cheilitis 11/12/2014  . Narrowing of intervertebral disc space 11/12/2014  . Deflected nasal septum 11/12/2014  . HLD (hyperlipidemia) 11/12/2014  . BP (high blood pressure) 11/12/2014  . Adult hypothyroidism 11/12/2014  . Sleep apnea 11/12/2014  . Diabetes mellitus, type 2 ([place] 11/12/2014  . Degenerative disc disease, lumbar 09/24/2012  . Furunculosis 04/23/2012   JPhillips GroutPT, DPT, GCS  Fahim Kats 07/09/2019, 12:40 PM  [place] [facility] HospitalSERVICES [address]RPaint [place] [address]Phone: [phone]  Fax:  [phone] Name: RTANIA PERROTTMRN: [id number] of Birth: 111/26/1947

## 2019-07-14 ENCOUNTER — Other Ambulatory Visit: Payer: Self-pay

## 2019-07-14 NOTE — Progress Notes (Signed)
Patient pre screened for office appointment, no questions or concerns today. Patient reminded of upcoming appointment time and date. 

## 2019-07-15 ENCOUNTER — Inpatient Hospital Stay: Payer: Medicare Other

## 2019-07-15 ENCOUNTER — Telehealth: Payer: Self-pay

## 2019-07-15 ENCOUNTER — Inpatient Hospital Stay: Payer: Medicare Other | Attending: Oncology | Admitting: Oncology

## 2019-07-15 ENCOUNTER — Encounter: Payer: Self-pay | Admitting: Oncology

## 2019-07-15 ENCOUNTER — Inpatient Hospital Stay (HOSPITAL_BASED_OUTPATIENT_CLINIC_OR_DEPARTMENT_OTHER): Payer: Medicare Other | Admitting: Hospice and Palliative Medicine

## 2019-07-15 DIAGNOSIS — T451X5A Adverse effect of antineoplastic and immunosuppressive drugs, initial encounter: Secondary | ICD-10-CM

## 2019-07-15 DIAGNOSIS — C642 Malignant neoplasm of left kidney, except renal pelvis: Secondary | ICD-10-CM | POA: Insufficient documentation

## 2019-07-15 DIAGNOSIS — Z7189 Other specified counseling: Secondary | ICD-10-CM

## 2019-07-15 DIAGNOSIS — C689 Malignant neoplasm of urinary organ, unspecified: Secondary | ICD-10-CM | POA: Diagnosis not present

## 2019-07-15 DIAGNOSIS — G620 Drug-induced polyneuropathy: Secondary | ICD-10-CM | POA: Diagnosis not present

## 2019-07-15 DIAGNOSIS — Z5111 Encounter for antineoplastic chemotherapy: Secondary | ICD-10-CM

## 2019-07-15 DIAGNOSIS — Z5112 Encounter for antineoplastic immunotherapy: Secondary | ICD-10-CM | POA: Insufficient documentation

## 2019-07-15 DIAGNOSIS — Z515 Encounter for palliative care: Secondary | ICD-10-CM

## 2019-07-15 DIAGNOSIS — C772 Secondary and unspecified malignant neoplasm of intra-abdominal lymph nodes: Secondary | ICD-10-CM | POA: Insufficient documentation

## 2019-07-15 NOTE — Telephone Encounter (Incomplete)
Telephone call to home to schedule palliative care visit.

## 2019-07-15 NOTE — Progress Notes (Signed)
Was not able to reach patient by phone. Will reschedule.

## 2019-07-15 NOTE — Telephone Encounter (Signed)
Telephone call to patients wife [name] to schedule palliative care visit.  [name] in agreement with home care visit on 07-17-19 at 1:00 PM.  [name] reports if patient receives call from [facility] to receive chemo appointment may need to be changed.

## 2019-07-16 NOTE — Telephone Encounter (Signed)
I called the Patrick Mcgee's home and spoke to wife and the Patrick Mcgee. Should get vaccine if he wants it. From cancer standpoint it is recommended to get it. Wife will tell Patrick Mcgee

## 2019-07-17 ENCOUNTER — Other Ambulatory Visit: Payer: Medicare Other

## 2019-07-17 ENCOUNTER — Encounter: Payer: Self-pay | Admitting: Oncology

## 2019-07-17 ENCOUNTER — Ambulatory Visit: Payer: Medicare Other | Attending: Family Medicine

## 2019-07-17 ENCOUNTER — Other Ambulatory Visit: Payer: Self-pay

## 2019-07-17 VITALS — BP 130/87 | HR 87

## 2019-07-17 DIAGNOSIS — R2681 Unsteadiness on feet: Secondary | ICD-10-CM | POA: Insufficient documentation

## 2019-07-17 DIAGNOSIS — M6281 Muscle weakness (generalized): Secondary | ICD-10-CM | POA: Insufficient documentation

## 2019-07-17 DIAGNOSIS — Z515 Encounter for palliative care: Secondary | ICD-10-CM

## 2019-07-17 NOTE — Therapy (Signed)
[place] MAIN [facility] SERVICES [address] [place], [place], [address] Phone: [phone]   Fax:  [phone]  Physical Therapy Treatment  Patient Details  Name: Patrick Mcgee MRN: [id number] Date of Birth: [birth year] Referring Provider (PT): Dr. Rosanna Randy   Encounter Date: 07/17/2019  PT End of Session - 07/17/19 1523    Visit Number  12    Number of Visits  25    Date for PT Re-Evaluation  08/25/19    PT Start Time  7939    PT Stop Time  1601    PT Time Calculation (min)  38 min    Equipment Utilized During Treatment  Gait belt    Activity Tolerance  Patient tolerated treatment well       Past Medical History:  Diagnosis Date  . Acid reflux   . Anxiety   . Depression   . Diabetes mellitus without complication ([place])   . Hyperlipidemia   . Hypertension   . MRSA (methicillin resistant Staphylococcus aureus) infection 1992   history  . Myocardial infarction ([place]) 1995  . Sleep apnea    CPAP  . Urothelial cancer ([place]) 11/2017   Left Urothelial mass, chemo tx's    Past Surgical History:  Procedure Laterality Date  . CATARACT EXTRACTION Left   . COLONOSCOPY  2010   [facility]  . COLONOSCOPY WITH PROPOFOL N/A 10/04/2016   Procedure: COLONOSCOPY WITH PROPOFOL;  Surgeon: [name], MD;  Location: [facility] ENDOSCOPY;  Service: Endoscopy;  Laterality: N/A;  . [place], 2000, 2001, 2014  . CYSTOSCOPY W/ RETROGRADES Bilateral 12/10/2017   Procedure: CYSTOSCOPY WITH RETROGRADE PYELOGRAM;  Surgeon: [name], MD;  Location: [facility] ORS;  Service: Urology;  Laterality: Bilateral;  . CYSTOSCOPY W/ RETROGRADES Left 12/09/2018   Procedure: CYSTOSCOPY WITH RETROGRADE PYELOGRAM;  Surgeon: [name], MD;  Location: [facility] ORS;  Service: Urology;  Laterality: Left;  . CYSTOSCOPY WITH BIOPSY Left 12/09/2018   Procedure: CYSTOSCOPY WITH URETERAL/RENAL PELVIC BIOPSY;  Surgeon: [name], MD;   Location: [facility] ORS;  Service: Urology;  Laterality: Left;  . CYSTOSCOPY WITH STENT PLACEMENT Left 12/10/2017   Procedure: CYSTOSCOPY WITH STENT PLACEMENT;  Surgeon: [name], MD;  Location: [facility] ORS;  Service: Urology;  Laterality: Left;  . CYSTOSCOPY WITH STENT PLACEMENT Left 12/09/2018   Procedure: CYSTOSCOPY WITH STENT PLACEMENT;  Surgeon: [name], MD;  Location: [facility] ORS;  Service: Urology;  Laterality: Left;  . CYSTOSCOPY WITH URETEROSCOPY Left 12/09/2018   Procedure: CYSTOSCOPY WITH URETEROSCOPY;  Surgeon: [name], MD;  Location: [facility] ORS;  Service: Urology;  Laterality: Left;  . EYE SURGERY Bilateral    cataract  . INGUINAL HERNIA REPAIR Right 08/09/2015   Procedure: HERNIA REPAIR INGUINAL ADULT;  Surgeon: [name], MD;  Location: [facility] ORS;  Service: General;  Laterality: Right;  . NASAL SINUS SURGERY    . nuclear stress test    . PORTA CATH INSERTION N/A 12/19/2017   Procedure: PORTA CATH INSERTION;  Surgeon: [name], MD;  Location: [place] CV LAB;  Service: Cardiovascular;  Laterality: N/A;  . URETERAL BIOPSY Left 12/10/2017   Procedure: URETERAL & renal PELVIS BIOPSY;  Surgeon: [name], MD;  Location: [facility] ORS;  Service: Urology;  Laterality: Left;  . URETEROSCOPY Left 12/10/2017   Procedure: URETEROSCOPY;  Surgeon: [name], MD;  Location: [facility] ORS;  Service: Urology;  Laterality: Left;    Vitals:   07/17/19 1532  BP: 130/87  Pulse: 87    Subjective Assessment - 07/17/19 1530    Subjective  Patient reported dizziness at start of session. Patient amended statement to feeling "off balance". Started yesterday. He reported that when he first started PT at home he felt this way, but hasn't felt that way since he's started outpatient PT.    Pertinent History  Part of history borrowed from oncology note and verified with patient: Patient is a 74 year old male who was referred for foot drop. He started with Dr. [name] so far for his  metastatic urothelial carcinoma.  This was originally diagnosed in May 2019. He was started on chemotherapy in June 2019 and was continued on 05/27/2018 for 8 cycles.  He tolerated chemotherapy well except for chemo-induced anemia for which she has been getting Procrit every 2 weeks.  He did have response to his disease based on scans in August 2019.  However repeat scan on 05/31/2018 showed increase in the size of the primary tumor from 1.2 to 1.9 cm and increase in left para-aortic adenopathy from 0.9 to 1.3 cm.  Second line immunotherapy was recommended.  His initial biopsy specimen did not have enough sample to undergo FGFR mutation testing. He also has mild interstitial lung disease for which he sees pulmonary but he is not on home oxygen.  He has B12 deficiency for which he is on oral B12.  Also has diabetes and coronary artery disease. Patient noted to have disease progression in his lymph nodes in May 2020.  Repeat biopsy showed metastatic urothelial carcinoma which did not have a FGFR mutation.  He has been started on third line Padcev. There is still times when he feels fatigued but he is having more good days. He reports poor appetite. He was working with physical therapy on his foot drop, RLE weakness, and unsteadiness. He states that he gets considerable numbness for the 7-8 days following his chemotherapy treatment. He states that he is a diabetic and did have some numbness prior to starting chemotherapy. He states that he has a history of a low back disc bulge with RLE radicular symptoms. He used to get spinal injections but reports he hasn't had any injections in the last 10 years.    Limitations  Walking    Currently in Pain?  No/denies       TREATMENT:  Ther-ex  Cross trainer x 5 minutes level 4-6 with PT adjusting resistance every minute  Sit to stands from blue foam x10, sit to stand with butt touches to raised plinth without foam  x10 cues for proper hip hinge  Standing marching with  3# ankle weights without UE support, 2x15 twice, cued for activity pacing and increased hip flexion, and pause at end range  4 square stepping with PT cues for which square and 3# ankle weights ~5 minutes. Pt most challenged by diagonal stepping and backwards stepping, intermittent MinA for LOB  Lateral taps to 6" step without UE support x10 ea side  Forward step ups to 6" step without UE support x10 ea side, mod verbal cues for coordination of movement.    Pt educated throughout session about proper posture and technique with exercises. Improved exercise technique, movement at target joints, use of target muscles after min to mod verbal, visual, tactile cues.      Pt response/clinical impression: Pt exhibited more unsteadiness compared to previous session exhibited by intermittent stumbling step, loss of balance, and pt report. PT and pt reviewed vitals, pt denied numbness/tingling, UE or LE  unilateral weakness, facial symmetry WFLs. PT did endorse he has not had any water. He was mostly concerned he was having after effects of the covid vaccine. PT provided pt with water during session and encouraged further hydration during remainder of day, and also instructed to check blood sugar upon returning home. The patient would benefit from further skilled PT intervention to maximize safety, mobility, and independence.    PT Education - 07/18/19 1149    Education Details  proper hydration, check blood sugar, therapeutic exercise form/technique    Person(s) Educated  Patient    Methods  Explanation    Comprehension  Verbalized understanding       PT Short Term Goals - 07/07/19 1026      PT SHORT TERM GOAL #1   Title  Pt will be independent with HEP in order to improve strength and balance in order to decrease fall risk and improve function at home    Time  6    Period  Weeks    Status  On-going    Target Date  07/14/19        PT Long Term Goals - 07/09/19 1236      PT LONG TERM GOAL  #1   Title  Pt will improve BERG by at least 3 points in order to demonstrate clinically significant improvement in balance.    Baseline  06/02/19: 48/56; 07/08/19: 54/56    Time  12    Period  Weeks    Status  Achieved      PT LONG TERM GOAL #2   Title  Pt will improve ABC by at least 13% in order to demonstrate clinically significant improvement in balance confidence.    Baseline  06/02/19: 64.4%; 07/07/19: 65%    Time  12    Period  Weeks    Status  On-going    Target Date  08/25/19      PT LONG TERM GOAL #3   Title  Pt will increase 30s Sit to Stand Test to greater than or equal to 14 repetitions in order to demonstrate clinically significant improvement in LE endurance and decrease in fall risk.    Baseline  06/02/19: 10 repetitions; 07/07/19: 13 repetitions    Time  12    Period  Weeks    Status  Partially Met    Target Date  08/25/19      PT LONG TERM GOAL #4   Title  Pt will increase 6MWT by at least 137m(3284f in order to demonstrate clinically significant improvement in cardiopulmonary endurance and community ambulation    Baseline  06/10/20: 137562fno AD, no LOB, Rt foot slap without flat foot strike; 07/07/19: test terminated 2/2 fall; 07/09/19: 1565' with R AFO, no assistive device, mask donned;    Time  12    Period  Weeks    Status  Revised    Target Date  08/25/19            Plan - 07/18/19 1149    Clinical Impression Statement  Pt exhibited more unsteadiness compared to previous session exhibited by intermittent stumbling step, loss of balance, and pt report. PT and pt reviewed vitals, pt denied numbness/tingling, UE or LE unilateral weakness, facial symmetry WFLs. PT did endorse he has not had any water. He was mostly concerned he was having after effects of the covid vaccine. PT provided pt with water during session and encouraged further hydration during remainder of day, and also instructed to check  blood sugar upon returning home. The patient would  benefit from further skilled PT intervention to maximize safety, mobility, and independence.    Personal Factors and Comorbidities  Age;Comorbidity 3+;Time since onset of injury/illness/exacerbation    Comorbidities  Urothelial cancer, DM, previous lumbar disc bulge, interstitial pulmonary disease    Examination-Activity Limitations  Locomotion Level;Stairs;Transfers    Examination-Participation Restrictions  Community Activity;Laundry;Shop    Rehab Potential  Fair    PT Frequency  2x / week    PT Duration  12 weeks    PT Treatment/Interventions  ADLs/Self Care Home Management;Aquatic Therapy;Biofeedback;Canalith Repostioning;Cryotherapy;Iontophoresis '4mg'$ /ml Dexamethasone;Moist Heat;Traction;Ultrasound;DME Instruction;Gait training;Stair training;Functional mobility training;Therapeutic activities;Therapeutic exercise;Balance training;Neuromuscular re-education;Patient/family education;Orthotic Fit/Training;Manual techniques;Dry needling;Energy conservation;Vestibular;Joint Manipulations    PT Next Visit Plan  Continue with strengthening and balance    PT Home Exercise Plan  Access Code: [email or web address]    Consulted and Agree with Plan of Care  Patient       Patient will benefit from skilled therapeutic intervention in order to improve the following deficits and impairments:  Abnormal gait, Decreased activity tolerance, Decreased balance, Decreased strength  Visit Diagnosis: Muscle weakness (generalized)  Unsteadiness on feet     Problem List Patient Active Problem List   Diagnosis Date Noted  . Chest pain 03/04/2019  . Anemia due to antineoplastic chemotherapy 03/03/2018  . Cough 03/03/2018  . Chemotherapy induced neutropenia ([place]) 01/22/2018  . B12 deficiency 01/01/2018  . Hypomagnesemia 12/21/2017  . Encounter for antineoplastic chemotherapy 12/21/2017  . Malignant neoplasm of kidney ([place])   . Urothelial cancer ([place]) 12/18/2017  . Malnutrition of moderate degree 12/18/2017  .  Therapeutic opioid induced constipation   . Palliative care encounter   . Dehydration 12/17/2017  . Goals of care, counseling/discussion 12/13/2017  . Urothelial carcinoma of kidney, left ([place]) 12/07/2017  . Right inguinal hernia 07/17/2015  . Family history of colon cancer 07/17/2015  . Allergic rhinitis 11/12/2014  . Airway hyperreactivity 11/12/2014  . Atherosclerosis of coronary artery 11/12/2014  . Cheilitis 11/12/2014  . Narrowing of intervertebral disc space 11/12/2014  . Deflected nasal septum 11/12/2014  . HLD (hyperlipidemia) 11/12/2014  . BP (high blood pressure) 11/12/2014  . Adult hypothyroidism 11/12/2014  . Sleep apnea 11/12/2014  . Diabetes mellitus, type 2 ([place]) 11/12/2014  . Degenerative disc disease, lumbar 09/24/2012  . Furunculosis 04/23/2012    Lieutenant Diego PT, DPT 11:55 AM,07/18/19   [place] MAIN [facility] SERVICES [address] [place], [place], [address] Phone: [phone]   Fax:  [phone]  Name: Patrick Mcgee MRN: [id number] Date of Birth: [birth year]

## 2019-07-17 NOTE — Progress Notes (Signed)
I connected with Patrick Mcgee on 07/17/19 at 10:00 AM EST by video enabled telemedicine visit and verified that I am speaking with the correct person using two identifiers.   I discussed the limitations, risks, security and privacy concerns of performing an evaluation and management service by telemedicine and the availability of in-person appointments. I also discussed with the patient that there may be a patient responsible charge related to this service. The patient expressed understanding and agreed to proceed.  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 [name] so far for his metastatic urothelial carcinoma. This was originally diagnosed in May 2019. He was noted to have a filling defect in the lower pole collecting system of the left kidney along with para-aortic and retroperitoneal adenopathy concerning for metastatic disease. He underwent left ureteroscopy and renal pelvis biopsy which revealed small fragments of high-grade urothelial carcinoma with small focus of invasion. He was started on carboplatin and gemcitabine chemotherapy in June 2019 and was continued on 05/27/2018 for 8 cycles. He tolerated chemotherapy well except for chemo-induced anemia for which she has been getting Procrit every 2 weeks. He did have response to his disease based on scans in August 2019. However repeat scan on 05/31/2018 showed increase in the size of the primary tumor from 1.2 to 1.9 cm and increase in left para-aortic adenopathy from 0.9 to 1.3 cm. Periportal adenopathy was stable at 1.4 cm. Second line immunotherapy was recommended. His initial biopsy specimen did not have enough sample to undergo FGFR  mutation testing. He also has mild interstitial lung disease for which he sees pulmonary but he is not on home oxygen. He has B12 deficiency for which he is on oral B12. Also has diabetes and coronary artery disease.Tecentriq started on 06/17/2018.Patient noted to have disease progression in his lymph nodes in May 2020. Repeat biopsy showed metastatic urothelial carcinoma which did not have aFGFR mutation. He has been started on third line Padcev  Interval history: He still endorses ongoing fatigue.  Appetite is fair.  Neuropathy has remained overall stable.  He has more good days than bad days with 2-week on 2-week off regimen   Review of Systems  Constitutional: Positive for malaise/fatigue. Negative for chills, fever and weight loss.  HENT: Negative for congestion, ear discharge and nosebleeds.   Eyes: Negative for blurred vision.  Respiratory: Negative for cough, hemoptysis, sputum production, shortness of breath and wheezing.   Cardiovascular: Negative for chest pain, palpitations, orthopnea and claudication.  Gastrointestinal: Negative for abdominal pain, blood in stool, constipation, diarrhea, heartburn, melena, nausea and vomiting.  Genitourinary: Negative for dysuria, flank pain, frequency, hematuria and urgency.  Musculoskeletal: Negative for back pain, joint pain and myalgias.  Skin: Negative for rash.  Neurological: Positive for sensory change (Peripheral neuropathy). Negative for dizziness, tingling, focal weakness, seizures, weakness and headaches.  Endo/Heme/Allergies: Does not bruise/bleed easily.  Psychiatric/Behavioral: Negative for depression and suicidal ideas. The patient does not have insomnia.     Allergies  Allergen Reactions  . Sulfa Antibiotics Other (See Comments)    Joint pain  . Ace Inhibitors Cough  . Invokana [Canagliflozin] Other (See Comments)    Leg pain  . Penicillins Rash    Did it involve swelling  of the face/tongue/throat, SOB, or low BP?  No Did it involve sudden or severe rash/hives, skin peeling, or any reaction on the inside of your mouth or nose? Yes Did you need to seek medical attention at a hospital or doctor's office? Yes When did it last happen?childhood allergy If all above answers are "NO", may proceed with cephalosporin use.     Past Medical History:  Diagnosis Date  . Acid reflux   . Anxiety   . Depression   . Diabetes mellitus without complication ([place])   . Hyperlipidemia   . Hypertension   . MRSA (methicillin resistant Staphylococcus aureus) infection 1992   history  . Myocardial infarction ([place]) 1995  . Sleep apnea    CPAP  . Urothelial cancer ([place]) 11/2017   Left Urothelial mass, chemo tx's    Past Surgical History:  Procedure Laterality Date  . CATARACT EXTRACTION Left   . COLONOSCOPY  2010   [facility]  . COLONOSCOPY WITH PROPOFOL N/A 10/04/2016   Procedure: COLONOSCOPY WITH PROPOFOL;  Surgeon: [name], MD;  Location: [facility] ENDOSCOPY;  Service: Endoscopy;  Laterality: N/A;  . [place], 2000, 2001, 2014  . CYSTOSCOPY W/ RETROGRADES Bilateral 12/10/2017   Procedure: CYSTOSCOPY WITH RETROGRADE PYELOGRAM;  Surgeon: [name], MD;  Location: [facility] ORS;  Service: Urology;  Laterality: Bilateral;  . CYSTOSCOPY W/ RETROGRADES Left 12/09/2018   Procedure: CYSTOSCOPY WITH RETROGRADE PYELOGRAM;  Surgeon: [name], MD;  Location: [facility] ORS;  Service: Urology;  Laterality: Left;  . CYSTOSCOPY WITH BIOPSY Left 12/09/2018   Procedure: CYSTOSCOPY WITH URETERAL/RENAL PELVIC BIOPSY;  Surgeon: [name], MD;  Location: [facility] ORS;  Service: Urology;  Laterality: Left;  . CYSTOSCOPY WITH STENT PLACEMENT Left 12/10/2017   Procedure: CYSTOSCOPY WITH STENT PLACEMENT;  Surgeon: [name], MD;  Location: [facility] ORS;  Service: Urology;  Laterality: Left;  . CYSTOSCOPY WITH STENT PLACEMENT Left 12/09/2018   Procedure: CYSTOSCOPY WITH STENT PLACEMENT;   Surgeon: [name], MD;  Location: [facility] ORS;  Service: Urology;  Laterality: Left;  . CYSTOSCOPY WITH URETEROSCOPY Left 12/09/2018   Procedure: CYSTOSCOPY WITH URETEROSCOPY;  Surgeon: [name], MD;  Location: [facility] ORS;  Service: Urology;  Laterality: Left;  . EYE SURGERY Bilateral    cataract  . INGUINAL HERNIA REPAIR Right 08/09/2015   Procedure: HERNIA REPAIR INGUINAL ADULT;  Surgeon: [name], MD;  Location: [facility] ORS;  Service: General;  Laterality: Right;  . NASAL SINUS SURGERY    . nuclear stress test    . PORTA CATH INSERTION N/A 12/19/2017   Procedure: PORTA CATH INSERTION;  Surgeon: [name], MD;  Location: [place] CV LAB;  Service: Cardiovascular;  Laterality: N/A;  . URETERAL BIOPSY Left 12/10/2017   Procedure: URETERAL & renal PELVIS BIOPSY;  Surgeon: [name], MD;  Location: [facility] ORS;  Service: Urology;  Laterality: Left;  . URETEROSCOPY Left 12/10/2017   Procedure: URETEROSCOPY;  Surgeon: [name], MD;  Location: [facility] ORS;  Service: Urology;  Laterality: Left;    Social History   Socioeconomic History  . Marital status: Married    Spouse name: [name]  . Number of children: 1  . Years of education: Not on file  . Highest education level: Associate degree: occupational, Hotel manager, or vocational program  Occupational History  . Occupation: retired  Tobacco Use  . Smoking status: Former Smoker    Types: Cigars    Quit date: 10/09/1978    Years since quitting: 40.7  .  Smokeless tobacco: Former Systems developer    Types: Chew    Quit date: 12/04/1988  . Tobacco comment: on occasion  Substance and Sexual Activity  . Alcohol use: Not Currently  . Drug use: No  . Sexual activity: Not Currently  Other Topics Concern  . Not on file  Social History Narrative  . Not on file   Social Determinants of Health   Financial Resource Strain:   . Difficulty of Paying Living Expenses: Not on file  Food Insecurity:   . Worried About Charity fundraiser in  the Last Year: Not on file  . Ran Out of Food in the Last Year: Not on file  Transportation Needs:   . Lack of Transportation (Medical): Not on file  . Lack of Transportation (Non-Medical): Not on file  Physical Activity: Inactive  . Days of Exercise per Week: 0 days  . Minutes of Exercise per Session: 0 min  Stress: No Stress Concern Present  . Feeling of Stress : Not at all  Social Connections: Unknown  . Frequency of Communication with Friends and Family: Patient refused  . Frequency of Social Gatherings with Friends and Family: Patient refused  . Attends Religious Services: Patient refused  . Active Member of Clubs or Organizations: Patient refused  . Attends Archivist Meetings: Patient refused  . Marital Status: Patient refused  Intimate Partner Violence: Unknown  . Fear of Current or Ex-Partner: Patient refused  . Emotionally Abused: Patient refused  . Physically Abused: Patient refused  . Sexually Abused: Patient refused    Family History  Problem Relation Age of Onset  . Heart disease Mother   . Cancer Father        Lung and colon cancer  . Heart disease Father   . Emphysema Maternal Grandfather   . Tuberculosis Maternal Grandmother      Current Outpatient Medications:  .  aspirin EC 81 MG tablet, Take 81 mg by mouth every evening. , Disp: , Rfl:  .  atorvastatin (LIPITOR) 40 MG tablet, TAKE 1 TABLET BY MOUTH AT BEDTIME, Disp: 90 tablet, Rfl: 4 .  Cholecalciferol (VITAMIN D3) 75 MCG (3000 UT) TABS, Take by mouth., Disp: , Rfl:  .  clobetasol cream (TEMOVATE) [id number] %, Apply 1 application topically as needed (for skin rash)., Disp: 30 g, Rfl: 1 .  fluticasone (FLONASE) 50 MCG/ACT nasal spray, Use 2 spray(s) in each nostril once daily, Disp: 48 g, Rfl: 3 .  gabapentin (NEURONTIN) 300 MG capsule, Take 1 capsule (300 mg total) by mouth 3 (three) times daily., Disp: 270 capsule, Rfl: 0 .  glucose blood (CONTOUR NEXT TEST) test strip, Check blood sugars 3 times  daily., Disp: 300 each, Rfl: 11 .  HYDROcodone-acetaminophen (NORCO/VICODIN) 5-325 MG tablet, Take 1 tablet by mouth 1 day or 1 dose., Disp: , Rfl:  .  hydrOXYzine (ATARAX/VISTARIL) 25 MG tablet, Take 1 tablet (25 mg total) by mouth every 8 (eight) hours as needed for itching., Disp: 30 tablet, Rfl: 3 .  Ivermectin (SOOLANTRA) 1 % CREA, Apply 1 application topically daily as needed (rosacea). To face, Disp: , Rfl:  .  JARDIANCE 10 MG TABS tablet, Take 1 tablet by mouth once daily, Disp: 30 tablet, Rfl: 0 .  lidocaine-prilocaine (EMLA) cream, Apply 1 application topically as needed (port access)., Disp: 1 g, Rfl: 3 .  loratadine (CLARITIN) 10 MG tablet, Take 10 mg by mouth daily as needed for allergies. [employer] brand allergy relief, Disp: , Rfl:  .  losartan (COZAAR) 100 MG tablet, Take 1 tablet by mouth once daily, Disp: 90 tablet, Rfl: 3 .  magic mouthwash w/lidocaine SOLN, Take 5 mLs by mouth 4 (four) times daily as needed for mouth pain., Disp: 480 mL, Rfl: 3 .  magnesium hydroxide (MILK OF MAGNESIA) 400 MG/5ML suspension, Take 15 mLs by mouth daily as needed for mild constipation., Disp: , Rfl:  .  metFORMIN (GLUCOPHAGE) 1000 MG tablet, TAKE 1 TABLET BY MOUTH TWICE DAILY WITH MEALS, Disp: 180 tablet, Rfl: 0 .  nystatin cream (MYCOSTATIN), Apply topically 2 (two) times daily., Disp: 15 g, Rfl: 1 .  OLANZapine (ZYPREXA) 10 MG tablet, Take 1 tablet (10 mg total) by mouth at bedtime., Disp: 30 tablet, Rfl: 1 .  OVER THE COUNTER MEDICATION, Apply 1 application topically daily as needed (pain). Outback topical pain relief, Disp: , Rfl:  .  oxybutynin (DITROPAN) 5 MG tablet, Take 1 tablet by mouth 1 day or 1 dose., Disp: , Rfl:  .  sertraline (ZOLOFT) 100 MG tablet, Take 1 tablet (100 mg total) by mouth daily., Disp: 90 tablet, Rfl: 3 .  vitamin B-12 (CYANOCOBALAMIN) 1000 MCG tablet, Take by mouth daily. Unsure dose, Disp: , Rfl:  .  vitamin C (ASCORBIC ACID) 500 MG tablet, Take 1,000-1,500 mg by  mouth daily as needed (immune support)., Disp: , Rfl:  .  VITAMIN D PO, Take 1 tablet by mouth 1 day or 1 dose., Disp: , Rfl:  No current facility-administered medications for this visit.  Facility-Administered Medications Ordered in Other Visits:  .  Influenza vac split quadrivalent PF (FLUZONE HIGH-DOSE) injection 0.5 mL, 0.5 mL, Intramuscular, Once, Karen Kitchens, NP  CT Chest W Contrast  Result Date: 07/08/2019 CLINICAL DATA:  Restaging metastatic urothelial carcinoma EXAM: CT CHEST, ABDOMEN, AND PELVIS WITH CONTRAST TECHNIQUE: Multidetector CT imaging of the chest, abdomen and pelvis was performed following the standard protocol during bolus administration of intravenous contrast. CONTRAST:  137mL OMNIPAQUE IOHEXOL 300 MG/ML SOLN, additional oral enteric contrast COMPARISON:  03/31/2019, 11/25/2018, 08/29/2018, 05/31/2018 FINDINGS: CT CHEST FINDINGS Cardiovascular: Aortic atherosclerosis. Normal heart size. Three-vessel coronary artery calcifications and/or stents. No pericardial effusion. Right chest port catheter. Mediastinum/Nodes: No enlarged mediastinal, hilar, or axillary lymph nodes. Thyroid gland, trachea, and esophagus demonstrate no significant findings. Lungs/Pleura: No significant change in mild pulmonary fibrosis, pattern predominantly featuring peripheral irregular interstitial opacity without significant bronchiectasis or bronchiolectasis, and a slight apical to basal gradient. No pleural effusion or pneumothorax. Musculoskeletal: No chest wall mass or suspicious bone lesions identified. CT ABDOMEN PELVIS FINDINGS Hepatobiliary: No solid liver abnormality is seen. Small calcified gallstone in the gallbladder. No gallbladder wall thickening, or biliary dilatation. Pancreas: Unremarkable. No pancreatic ductal dilatation or surrounding inflammatory changes. Spleen: Normal in size without significant abnormality. Adrenals/Urinary Tract: Adrenal glands are unremarkable. Multiple  redemonstrated cysts of the bilateral kidneys. There is a redemonstrated mildly hypoenhancing and atrophic cortical region of the anterior midportion of the left kidney, a central soft tissue mass of the calices and pelvis no longer appreciated, as on prior examination. Thickening of the decompressed urinary bladder, likely due to chronic outlet obstruction. Stomach/Bowel: Stomach is within normal limits. Appendix appears normal. No evidence of bowel wall thickening, distention, or inflammatory changes. Large burden of stool throughout the colon. Vascular/Lymphatic: Aortic atherosclerosis. Left periaortic lymphadenopathy has continued to resolved, with no discretely appreciable lymph nodes remaining (series 2, image 70). Reproductive: No mass or other abnormality. Other: No abdominal wall hernia or abnormality. No abdominopelvic ascites. Musculoskeletal: No  acute or significant osseous findings. IMPRESSION: 1. There is a redemonstrated mildly hypoenhancing and atrophic cortical region of the anterior midportion of the left kidney, a central soft tissue mass of the calices and pelvis no longer appreciated, as on prior examination. 2. Continued interval resolution of left periaortic lymphadenopathy, with no discretely appreciable lymph nodes remaining. Findings remain consistent with ongoing treatment response. 3. No evidence of metastatic disease in the chest. 4. No significant change in mild pulmonary fibrosis, pattern predominantly featuring peripheral irregular interstitial opacity without significant bronchiectasis or bronchiolectasis, and a slight apical to basal gradient. 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 Findings are categorized as probable UIP per consensus guidelines: Diagnosis of Idiopathic Pulmonary Fibrosis: An Official ATS/ERS/JRS/ALAT Clinical Practice Guideline. [place], Iss 5, [phone], Mar 10 2017. 5. Cholelithiasis without  evidence of cholecystitis. 6. Coronary artery disease. Aortic Atherosclerosis (ICD10-I70.0). Electronically Signed   By: Eddie Candle M.D.   On: 07/08/2019 10:47   CT Abdomen Pelvis W Contrast  Result Date: 07/08/2019 CLINICAL DATA:  Restaging metastatic urothelial carcinoma EXAM: CT CHEST, ABDOMEN, AND PELVIS WITH CONTRAST TECHNIQUE: Multidetector CT imaging of the chest, abdomen and pelvis was performed following the standard protocol during bolus administration of intravenous contrast. CONTRAST:  118mL OMNIPAQUE IOHEXOL 300 MG/ML SOLN, additional oral enteric contrast COMPARISON:  03/31/2019, 11/25/2018, 08/29/2018, 05/31/2018 FINDINGS: CT CHEST FINDINGS Cardiovascular: Aortic atherosclerosis. Normal heart size. Three-vessel coronary artery calcifications and/or stents. No pericardial effusion. Right chest port catheter. Mediastinum/Nodes: No enlarged mediastinal, hilar, or axillary lymph nodes. Thyroid gland, trachea, and esophagus demonstrate no significant findings. Lungs/Pleura: No significant change in mild pulmonary fibrosis, pattern predominantly featuring peripheral irregular interstitial opacity without significant bronchiectasis or bronchiolectasis, and a slight apical to basal gradient. No pleural effusion or pneumothorax. Musculoskeletal: No chest wall mass or suspicious bone lesions identified. CT ABDOMEN PELVIS FINDINGS Hepatobiliary: No solid liver abnormality is seen. Small calcified gallstone in the gallbladder. No gallbladder wall thickening, or biliary dilatation. Pancreas: Unremarkable. No pancreatic ductal dilatation or surrounding inflammatory changes. Spleen: Normal in size without significant abnormality. Adrenals/Urinary Tract: Adrenal glands are unremarkable. Multiple redemonstrated cysts of the bilateral kidneys. There is a redemonstrated mildly hypoenhancing and atrophic cortical region of the anterior midportion of the left kidney, a central soft tissue mass of the calices and  pelvis no longer appreciated, as on prior examination. Thickening of the decompressed urinary bladder, likely due to chronic outlet obstruction. Stomach/Bowel: Stomach is within normal limits. Appendix appears normal. No evidence of bowel wall thickening, distention, or inflammatory changes. Large burden of stool throughout the colon. Vascular/Lymphatic: Aortic atherosclerosis. Left periaortic lymphadenopathy has continued to resolved, with no discretely appreciable lymph nodes remaining (series 2, image 70). Reproductive: No mass or other abnormality. Other: No abdominal wall hernia or abnormality. No abdominopelvic ascites. Musculoskeletal: No acute or significant osseous findings. IMPRESSION: 1. There is a redemonstrated mildly hypoenhancing and atrophic cortical region of the anterior midportion of the left kidney, a central soft tissue mass of the calices and pelvis no longer appreciated, as on prior examination. 2. Continued interval resolution of left periaortic lymphadenopathy, with no discretely appreciable lymph nodes remaining. Findings remain consistent with ongoing treatment response. 3. No evidence of metastatic disease in the chest. 4. No significant change in mild pulmonary fibrosis, pattern predominantly featuring peripheral irregular interstitial opacity without significant bronchiectasis or bronchiolectasis, and a slight apical to basal gradient. 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 Findings are categorized as probable UIP per consensus guidelines: Diagnosis of Idiopathic Pulmonary Fibrosis: An Official ATS/ERS/JRS/ALAT Clinical Practice Guideline. [place], Iss 5, [phone], Mar 10 2017. 5. Cholelithiasis without evidence of cholecystitis. 6. Coronary artery disease. Aortic Atherosclerosis (ICD10-I70.0). Electronically Signed   By: Eddie Candle M.D.   On: 07/08/2019 10:47    No images are attached to the encounter.   CMP  Latest Ref Rng & Units 06/23/2019  Glucose 70 - 99 mg/dL 261(H)  BUN 8 - 23 mg/dL 24(H)  Creatinine 0.61 - 1.24 mg/dL 0.83  Sodium 135 - 145 mmol/L 138  Potassium 3.5 - 5.1 mmol/L 4.5  Chloride 98 - 111 mmol/L 101  CO2 22 - 32 mmol/L 24  Calcium 8.9 - 10.3 mg/dL 9.7  Total Protein 6.5 - 8.1 g/dL 7.4  Total Bilirubin 0.3 - 1.2 mg/dL 0.8  Alkaline Phos 38 - 126 U/L 94  AST 15 - 41 U/L 22  ALT 0 - 44 U/L 15   CBC Latest Ref Rng & Units 06/23/2019  WBC 4.0 - 10.5 K/uL 7.0  Hemoglobin 13.0 - 17.0 g/dL 12.0(L)  Hematocrit 39.0 - 52.0 % 38.3(L)  Platelets 150 - 400 K/uL 247     Observation/objective: Appears in no acute distress of a video visit today.  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  He is currently on third line treatment with Padcev and still visit to discuss CT scan results  I have reviewed CT chest abdomen and pelvis images independently and discussed findings with the patient.  Back in May 2019 patient was found to have significant intra-abdominal adenopathy in addition to the primary mass in his left kidney.  His present CT scan showed that the adenopathy has almost resolved and the upper urothelial mass is barely seen at this time.  The only thing evident on today's scans is some hypoenhancement in atrophic cortical region of the anterior midportion of the left kidney.  He has had excellent response to treatment so far.  We discussed 2 options at this time.  If we hold Padcev to give him a break from treatment we are risking potential progression of disease.  Versus we can continue to give him Padcev 2 weeks on 2 weeks off if his neuropathy remains stable overall.  We can potentially consider doing 2 weeks on 3 weeks of to give him some chemotherapy to keep his disease under control while balancing his quality of life and neuropathy.  Patient verbalized  understanding and for now we will continue the 2-week on 2-week off regimen.  He presently do not have stock of Padcev available and we will get in touch with the patient when we procure the drug from the vendor.  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  He is also undergoing physical therapy for right foot drop  Follow-up instructions: Patient will directly proceed for cycle 8-day 1 of Padcev and cycle 8 day 8 of Padcev.  I will tentatively see him 5 weeks from now for cycle 9-day 1  I discussed the assessment and treatment plan with the patient. The patient was provided an opportunity to ask questions and all were answered. The patient agreed with the plan and demonstrated an understanding of the instructions.   The patient was advised to call back or seek an in-person evaluation if the symptoms worsen  or if the condition fails to improve as anticipated.   Visit Diagnosis: 1. Urothelial cancer ([place])   2. Encounter for antineoplastic chemotherapy   3. Goals of care, counseling/discussion     Dr. Randa Evens, MD, MPH [facility] [phone] 07/17/2019 8:29 AM

## 2019-07-17 NOTE — Progress Notes (Signed)
COMMUNITY PALLIATIVE CARE SW NOTE  PATIENT NAME: Patrick Mcgee DOB: 27-Sep-1945 MRN: [id number]  PRIMARY CARE PROVIDER: Jerrol Banana., MD  RESPONSIBLE PARTY:  Acct ID - Guarantor Home Phone Work Phone Relationship Acct Type  [id number] TARAY, ALLEN [phone]  Self P/F     [place], [place], [place] [address]     PLAN OF CARE and INTERVENTIONS:             1. GOALS OF CARE/ ADVANCE CARE PLANNING: Patientis DNR, form is in the home.HCPOA is [name] (patient's daughter).Patient's goal is to maintain his quality of life and focus on his faith. 2. SOCIAL/EMOTIONAL/SPIRITUAL ASSESSMENT/ INTERVENTIONS:  SW and RN completedhomevisit with patient and [name] (patient's wife). Patient discussed recent visit with oncology and report from his scan. Patient and [name] are pleased that patient received a positive report. Patient is considering continuing the treatment but not doing it as often. Patient denies pain, continues to have numbness in his fingers and toes. Patient is sleeping well. Patient's appetite has improved. Patient enjoyed the holidays and time with family. 3. PATIENT/CAREGIVER EDUCATION/ COPING:  Patient was alert, engaged in conversation. Patient was open, honest in processing his feelings. Patient was pleased with his good report from oncology. Patient continues to focus on quality of life. Patient copes with his faith and prayer, and family support.  4. PERSONAL EMERGENCY PLAN:  Patient and family will call 9-1-1 for emergencies. 5. COMMUNITY RESOURCES COORDINATION/ HEALTH CARE NAVIGATION:  Patient has PT appointment today. Patient sees PCP on 1/13. Patient will schedule another treatment when his medication comes in. 6. FINANCIAL/LEGAL CONCERNS/INTERVENTIONS:  None.     SOCIAL HX:  Social History   Tobacco Use  . Smoking status: Former Smoker    Types: Cigars    Quit date: 10/09/1978    Years since quitting: 40.7  . Smokeless tobacco: Former Systems developer    Types:  Chew    Quit date: 12/04/1988  . Tobacco comment: on occasion  Substance Use Topics  . Alcohol use: Not Currently    CODE STATUS:   Code Status: Prior (DNR) ADVANCED DIRECTIVES: Y MOST FORM COMPLETE:  No. HOSPICE EDUCATION PROVIDED: None.  PPS: Patient is independent of ADLs.Continuing weekly PT outpatient.  I spent69minutes with patient/family, from1:00-2:00pproviding education, support and consultation.   Margaretmary Lombard, LCSW

## 2019-07-17 NOTE — Progress Notes (Signed)
PATIENT NAME: Patrick Mcgee DOB: 30-Jan-1946 MRN: [id number]  PRIMARY CARE PROVIDER: Jerrol Mcgee., MD  RESPONSIBLE PARTY:  Acct ID - Guarantor Home Phone Work Phone Relationship Acct Type  [id number] Patrick Mcgee [phone]  Self P/F     [place], [place], [place] [address]    PLAN OF CARE and INTERVENTIONS:               1.  GOALS OF CARE/ ADVANCE CARE PLANNING:  Remain at home with wife and continue function at optimal level.               2.  PATIENT/CAREGIVER EDUCATION:  Education on fall precautions, education on s/s of infection, reviewed meds, support               3.  DISEASE STATUS: SW and RN made scheduled palliative care home visit. Patients wife Patrick Mcgee present for visit. Patient saw dentist this a.m. to have tooth repaired as front tooth cracked.  Patient denies having pain but continues to have numbness in his fingers and hands due to side effects of chemotherapy. Patient and wife report patient had scan on 12/29 and patient no longer had lymph nodes showing on SCAN in bladder. Patient and wife state patient received a good report after scan. Patient reports doctor Tasia Catchings informed him he could stop chemo for 3 months and have another scan. Wife is wanting patient to continue at least with chemo one-time monthly.  Patient has a strong faith and is undecided if he will continue chemo or not. Patient continues to receive outpatient PT. Patient reports he suffered a fall while receiving PT but did not injure himself. Patient and wife receive CoVid vaccines  yesterday. Patient reports his appetite is good, patient denies having any nausea or vomiting. Patient reports he has been resting well at night however patient continues to stay up late at night and sleep late during the day. Patient has no edema in his lower extremities. Patient has no open areas of skin breakdown. Patient has had no changes in current medications. Patient and wife remain in agreement with palliative care  services. Patient and wife encouraged to contact palliative care with questions or concerns.     HISTORY OF PRESENT ILLNESS: Patient is a 74 year old patient who resides in home with wife Patrick Mcgee.  Patient currently receiving chemo.  Patient being followed by palliative care team and is seen monthly and PRN   CODE STATUS: DNR  ADVANCED DIRECTIVES: Y MOST FORM: No PPS: 50%   PHYSICAL EXAM:   VITALS: Today's Vitals   07/17/19 1330  BP: (!) 144/100  Pulse: 98  Resp: 16  Temp: (!) 97.5 F (36.4 C)  TempSrc: Temporal  SpO2: 98%  Weight: 151 lb (68.5 kg)  PainSc: 0-No pain    LUNGS: clear to auscultation  CARDIAC: Cor RRR  EXTREMITIES: none edema SKIN: Skin color, texture, turgor normal. No rashes or lesions  NEURO: Alert and oriented x 4       Patrick Simmer, RN

## 2019-07-18 ENCOUNTER — Other Ambulatory Visit: Payer: Self-pay

## 2019-07-18 ENCOUNTER — Inpatient Hospital Stay: Payer: Medicare Other | Admitting: *Deleted

## 2019-07-18 ENCOUNTER — Inpatient Hospital Stay: Payer: Medicare Other

## 2019-07-18 ENCOUNTER — Other Ambulatory Visit: Payer: Self-pay | Admitting: Oncology

## 2019-07-18 VITALS — BP 127/85 | HR 70 | Temp 94.3°F | Resp 18 | Wt 155.2 lb

## 2019-07-18 DIAGNOSIS — C689 Malignant neoplasm of urinary organ, unspecified: Secondary | ICD-10-CM

## 2019-07-18 DIAGNOSIS — D701 Agranulocytosis secondary to cancer chemotherapy: Secondary | ICD-10-CM

## 2019-07-18 DIAGNOSIS — C642 Malignant neoplasm of left kidney, except renal pelvis: Secondary | ICD-10-CM | POA: Diagnosis not present

## 2019-07-18 DIAGNOSIS — Z5112 Encounter for antineoplastic immunotherapy: Secondary | ICD-10-CM | POA: Diagnosis not present

## 2019-07-18 DIAGNOSIS — C772 Secondary and unspecified malignant neoplasm of intra-abdominal lymph nodes: Secondary | ICD-10-CM | POA: Diagnosis not present

## 2019-07-18 DIAGNOSIS — Z95828 Presence of other vascular implants and grafts: Secondary | ICD-10-CM

## 2019-07-18 DIAGNOSIS — Z5111 Encounter for antineoplastic chemotherapy: Secondary | ICD-10-CM

## 2019-07-18 LAB — COMPREHENSIVE METABOLIC PANEL
ALT: 14 U/L (ref 0–44)
AST: 17 U/L (ref 15–41)
Albumin: 4 g/dL (ref 3.5–5.0)
Alkaline Phosphatase: 88 U/L (ref 38–126)
Anion gap: 11 (ref 5–15)
BUN: 19 mg/dL (ref 8–23)
CO2: 26 mmol/L (ref 22–32)
Calcium: 10 mg/dL (ref 8.9–10.3)
Chloride: 100 mmol/L (ref 98–111)
Creatinine, Ser: 0.76 mg/dL (ref 0.61–1.24)
GFR calc Af Amer: >60 mL/min (ref >60)
GFR calc non Af Amer: >60 mL/min (ref >60)
Glucose, Bld: 162 mg/dL — ABNORMAL HIGH (ref 70–99)
Potassium: 4.1 mmol/L (ref 3.5–5.1)
Sodium: 137 mmol/L (ref 135–145)
Total Bilirubin: 1 mg/dL (ref 0.3–1.2)
Total Protein: 7 g/dL (ref 6.5–8.1)

## 2019-07-18 LAB — CBC WITH DIFFERENTIAL/PLATELET
Abs Immature Granulocytes: 0.01 10*3/uL (ref 0.00–0.07)
Basophils Absolute: 0.1 10*3/uL (ref 0.0–0.1)
Basophils Relative: 1 %
Eosinophils Absolute: 0.2 10*3/uL (ref 0.0–0.5)
Eosinophils Relative: 3 %
HCT: 38.3 % — ABNORMAL LOW (ref 39.0–52.0)
Hemoglobin: 12.2 g/dL — ABNORMAL LOW (ref 13.0–17.0)
Immature Granulocytes: 0 %
Lymphocytes Relative: 29 %
Lymphs Abs: 1.9 10*3/uL (ref 0.7–4.0)
MCH: 27.9 pg (ref 26.0–34.0)
MCHC: 31.9 g/dL (ref 30.0–36.0)
MCV: 87.6 fL (ref 80.0–100.0)
Monocytes Absolute: 0.6 10*3/uL (ref 0.1–1.0)
Monocytes Relative: 10 %
Neutro Abs: 3.8 10*3/uL (ref 1.7–7.7)
Neutrophils Relative %: 57 %
Platelets: 223 10*3/uL (ref 150–400)
RBC: 4.37 MIL/uL (ref 4.22–5.81)
RDW: 17 % — ABNORMAL HIGH (ref 11.5–15.5)
WBC: 6.5 10*3/uL (ref 4.0–10.5)
nRBC: 0 % (ref 0.0–0.2)

## 2019-07-18 MED ORDER — SODIUM CHLORIDE 0.9 % IV SOLN
Freq: Once | INTRAVENOUS | Status: AC
  Filled 2019-07-18: qty 250

## 2019-07-18 MED ORDER — DEXAMETHASONE SODIUM PHOSPHATE 10 MG/ML IJ SOLN
10.0000 mg | Freq: Once | INTRAMUSCULAR | Status: DC
  Filled 2019-07-18: qty 1

## 2019-07-18 MED ORDER — SODIUM CHLORIDE 0.9 % IV SOLN
1.0000 mg/kg | Freq: Once | INTRAVENOUS | Status: AC
  Administered 2019-07-18: 70 mg via INTRAVENOUS
  Filled 2019-07-18: qty 6

## 2019-07-18 MED ORDER — HEPARIN SOD (PORK) LOCK FLUSH 100 UNIT/ML IV SOLN
500.0000 [IU] | Freq: Once | INTRAVENOUS | Status: AC | PRN
  Administered 2019-07-18: 500 [IU]
  Filled 2019-07-18: qty 5

## 2019-07-18 MED ORDER — PALONOSETRON HCL INJECTION 0.25 MG/5ML
0.2500 mg | Freq: Once | INTRAVENOUS | Status: AC
  Administered 2019-07-18: 0.25 mg via INTRAVENOUS
  Filled 2019-07-18: qty 5

## 2019-07-18 MED ORDER — SODIUM CHLORIDE 0.9% FLUSH
10.0000 mL | Freq: Once | INTRAVENOUS | Status: AC
  Administered 2019-07-18: 10 mL via INTRAVENOUS
  Filled 2019-07-18: qty 10

## 2019-07-18 MED ORDER — HEPARIN SOD (PORK) LOCK FLUSH 100 UNIT/ML IV SOLN
INTRAVENOUS | Status: AC
  Filled 2019-07-18: qty 5

## 2019-07-19 ENCOUNTER — Other Ambulatory Visit: Payer: Self-pay | Admitting: Family Medicine

## 2019-07-19 DIAGNOSIS — E119 Type 2 diabetes mellitus without complications: Secondary | ICD-10-CM

## 2019-07-22 ENCOUNTER — Ambulatory Visit: Payer: Medicare Other

## 2019-07-22 ENCOUNTER — Other Ambulatory Visit: Payer: Self-pay

## 2019-07-22 DIAGNOSIS — R2681 Unsteadiness on feet: Secondary | ICD-10-CM

## 2019-07-22 DIAGNOSIS — M6281 Muscle weakness (generalized): Secondary | ICD-10-CM | POA: Diagnosis not present

## 2019-07-22 NOTE — Therapy (Signed)
[place] MAIN [facility] SERVICES [address] [place], [place], [address] Phone: [phone]   Fax:  [phone]  Physical Therapy Treatment  Patient Details  Name: Patrick Mcgee MRN: [id number] Date of Birth: [birth year] Referring Provider (PT): Dr. Rosanna Randy   Encounter Date: 07/22/2019  PT End of Session - 07/22/19 1058    Visit Number  13    Number of Visits  25    Date for PT Re-Evaluation  08/25/19    PT Start Time  1100    PT Stop Time  1145    PT Time Calculation (min)  45 min    Equipment Utilized During Treatment  Gait belt    Activity Tolerance  Patient tolerated treatment well    Behavior During Therapy  [facility] for tasks assessed/performed       Past Medical History:  Diagnosis Date  . Acid reflux   . Anxiety   . Depression   . Diabetes mellitus without complication ([place])   . Hyperlipidemia   . Hypertension   . MRSA (methicillin resistant Staphylococcus aureus) infection 1992   history  . Myocardial infarction ([place]) 1995  . Sleep apnea    CPAP  . 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 CATARACT EXTRACTION Left   . COLONOSCOPY  2010   [facility]  . COLONOSCOPY WITH PROPOFOL N/A 10/04/2016   Procedure: COLONOSCOPY WITH PROPOFOL;  Surgeon: [name], MD;  Location: [facility] ENDOSCOPY;  Service: Endoscopy;  Laterality: N/A;  . [place], 2000, 2001, 2014  . CYSTOSCOPY W/ RETROGRADES Bilateral 12/10/2017   Procedure: CYSTOSCOPY WITH RETROGRADE PYELOGRAM;  Surgeon: [name], MD;  Location: [facility] ORS;  Service: Urology;  Laterality: Bilateral;  . CYSTOSCOPY W/ RETROGRADES Left 12/09/2018   Procedure: CYSTOSCOPY WITH RETROGRADE PYELOGRAM;  Surgeon: [name], MD;  Location: [facility] ORS;  Service: Urology;  Laterality: Left;  . CYSTOSCOPY WITH BIOPSY Left 12/09/2018   Procedure: CYSTOSCOPY WITH  URETERAL/RENAL PELVIC BIOPSY;  Surgeon: [name], MD;  Location: [facility] ORS;  Service: Urology;  Laterality: Left;  . CYSTOSCOPY WITH STENT PLACEMENT Left 12/10/2017   Procedure: CYSTOSCOPY WITH STENT PLACEMENT;  Surgeon: [name], MD;  Location: [facility] ORS;  Service: Urology;  Laterality: Left;  . CYSTOSCOPY WITH STENT PLACEMENT Left 12/09/2018   Procedure: CYSTOSCOPY WITH STENT PLACEMENT;  Surgeon: [name], MD;  Location: [facility] ORS;  Service: Urology;  Laterality: Left;  . CYSTOSCOPY WITH URETEROSCOPY Left 12/09/2018   Procedure: CYSTOSCOPY WITH URETEROSCOPY;  Surgeon: [name], MD;  Location: [facility] ORS;  Service: Urology;  Laterality: Left;  . EYE SURGERY Bilateral    cataract  . INGUINAL HERNIA REPAIR Right 08/09/2015   Procedure: HERNIA REPAIR INGUINAL ADULT;  Surgeon: [name], MD;  Location: [facility] ORS;  Service: General;  Laterality: Right;  . NASAL SINUS SURGERY    . nuclear stress test    . PORTA CATH INSERTION N/A 12/19/2017   Procedure: PORTA CATH INSERTION;  Surgeon: [name], MD;  Location: [facility] CV LAB;  Service: Cardiovascular;  Laterality: N/A;  . URETERAL BIOPSY Left 12/10/2017   Procedure: URETERAL & renal PELVIS BIOPSY;  Surgeon: [name], MD;  Location: [facility] ORS;  Service: Urology;  Laterality: Left;  . URETEROSCOPY Left 12/10/2017   Procedure: URETEROSCOPY;  Surgeon: [name], MD;  Location: [facility] ORS;  Service: Urology;  Laterality: Left;  There were no vitals filed for this visit.  Subjective Assessment - 07/22/19 1057    Subjective  Pt reports that he had his COVID vaccine and feels like he still hasn't fully recovered his energy. He denies any pain today. No specific questions or concerns upon arrival.    Pertinent History  Part of history borrowed from oncology note and verified with patient: Patient is a 74 year old male who was referred for foot drop. He started with Dr. [name] so far for his metastatic urothelial  carcinoma.  This was originally diagnosed in May 2019. He was started on chemotherapy in June 2019 and was continued on 05/27/2018 for 8 cycles.  He tolerated chemotherapy well except for chemo-induced anemia for which she has been getting Procrit every 2 weeks.  He did have response to his disease based on scans in August 2019.  However repeat scan on 05/31/2018 showed increase in the size of the primary tumor from 1.2 to 1.9 cm and increase in left para-aortic adenopathy from 0.9 to 1.3 cm.  Second line immunotherapy was recommended.  His initial biopsy specimen did not have enough sample to undergo FGFR mutation testing. He also has mild interstitial lung disease for which he sees pulmonary but he is not on home oxygen.  He has B12 deficiency for which he is on oral B12.  Also has diabetes and coronary artery disease. Patient noted to have disease progression in his lymph nodes in May 2020.  Repeat biopsy showed metastatic urothelial carcinoma which did not have a FGFR mutation.  He has been started on third line Padcev. There is still times when he feels fatigued but he is having more good days. He reports poor appetite. He was working with physical therapy on his foot drop, RLE weakness, and unsteadiness. He states that he gets considerable numbness for the 7-8 days following his chemotherapy treatment. He states that he is a diabetic and did have some numbness prior to starting chemotherapy. He states that he has a history of a low back disc bulge with RLE radicular symptoms. He used to get spinal injections but reports he hasn't had any injections in the last 10 years.    Limitations  Walking    Currently in Pain?  No/denies             TREATMENT   Ther-ex Cross trainer intervals L3/6, 45s intervals, x 5 minutes, therapist monitoring for fatigue; Sit to stand from regular height chair x 10 without UE support; Sit to stand from regular height chair without UE support with Airex under feet x  5; Seated clams with manual resistance 2 x 10; Seated adductor squeeze with manual resistance 2 x 10; Standing heel raises 2 x 10; Squats with glut taps on chair x 10, pt significantly fatigued;   Neuromuscular Re-education  1/2 foam roll static balance x 60s; 1/2 foam roll static balance with eyes closed x 30s; Rockerboard static balance A/P orientation x 30s; Rockerboard static balance A/P orientation eyes closed x 30s; Rockerboard static balance A/P orientation horizontal and vertical head turns x 30s each; Rockerboard static balance A/P orientation with lateral weight shifting x 30s; Rockerboard static balance R/L orientation x 30s; Rockerboard static balance R/L orientation eyes closed x 30s; Rockerboard static balance R/L orientation horizontal and vertical head turns x 30s each; Rockerboard static balance R/L orientation with forward/backward weight shifting x 30s; 6" orange hurdle steps forward/backward x 10, lateral x 10 each direction; Cone taps alternating LE without UE support  x 10 each; Knocking ball off cone without knocking over cone alternating LE x 5 each;   Pt educated throughout session about proper posture and technique with exercises. Improved exercise technique, movement at target joints, use of target muscles after min to mod verbal, visual, tactile cues.    Pt reports that he doesn't have the energy today to do the leg press so this exercise was avoided. However he is able to complete all other exercises as instructed with significant fatigue noted following squats. He does struggle some with horizontal/vertical head turns on the rockerboard as well as eyes closed balance on the 1/2 foam roll. He did significantly better today than during last session and denies any complaints of increased dizziness/imbalance during session. The patient would benefit from further skilled PT intervention to maximize safety, mobility, and  independence.                    PT Short Term Goals - 07/07/19 1026      PT SHORT TERM GOAL #1   Title  Pt will be independent with HEP in order to improve strength and balance in order to decrease fall risk and improve function at home    Time  6    Period  Weeks    Status  On-going    Target Date  07/14/19        PT Long Term Goals - 07/09/19 1236      PT LONG TERM GOAL #1   Title  Pt will improve BERG by at least 3 points in order to demonstrate clinically significant improvement in balance.    Baseline  06/02/19: 48/56; 07/08/19: 54/56    Time  12    Period  Weeks    Status  Achieved      PT LONG TERM GOAL #2   Title  Pt will improve ABC by at least 13% in order to demonstrate clinically significant improvement in balance confidence.    Baseline  06/02/19: 64.4%; 07/07/19: 65%    Time  12    Period  Weeks    Status  On-going    Target Date  08/25/19      PT LONG TERM GOAL #3   Title  Pt will increase 30s Sit to Stand Test to greater than or equal to 14 repetitions in order to demonstrate clinically significant improvement in LE endurance and decrease in fall risk.    Baseline  06/02/19: 10 repetitions; 07/07/19: 13 repetitions    Time  12    Period  Weeks    Status  Partially Met    Target Date  08/25/19      PT LONG TERM GOAL #4   Title  Pt will increase 6MWT by at least 173m(3253f in order to demonstrate clinically significant improvement in cardiopulmonary endurance and community ambulation    Baseline  06/10/20: 137562fno AD, no LOB, Rt foot slap without flat foot strike; 07/07/19: test terminated 2/2 fall; 07/09/19: 1565' with R AFO, no assistive device, mask donned;    Time  12    Period  Weeks    Status  Revised    Target Date  08/25/19            Plan - 07/22/19 1058    Clinical Impression Statement  Pt reports that he doesn't have the energy today to do the leg press so this exercise was avoided. However he is able to complete  all other exercises as instructed  with significant fatigue noted following squats. He does struggle some with horizontal/vertical head turns on the rockerboard as well as eyes closed balance on the 1/2 foam roll. He did significantly better today than during last session and denies any complaints of increased dizziness/imbalance during session. The patient would benefit from further skilled PT intervention to maximize safety, mobility, and independence.    Personal Factors and Comorbidities  Age;Comorbidity 3+;Time since onset of injury/illness/exacerbation    Comorbidities  Urothelial cancer, DM, previous lumbar disc bulge, interstitial pulmonary disease    Examination-Activity Limitations  Locomotion Level;Stairs;Transfers    Examination-Participation Restrictions  Community Activity;Laundry;Shop    Rehab Potential  Fair    PT Frequency  2x / week    PT Duration  12 weeks    PT Treatment/Interventions  ADLs/Self Care Home Management;Aquatic Therapy;Biofeedback;Canalith Repostioning;Cryotherapy;Iontophoresis '4mg'$ /ml Dexamethasone;Moist Heat;Traction;Ultrasound;DME Instruction;Gait training;Stair training;Functional mobility training;Therapeutic activities;Therapeutic exercise;Balance training;Neuromuscular re-education;Patient/family education;Orthotic Fit/Training;Manual techniques;Dry needling;Energy conservation;Vestibular;Joint Manipulations    PT Next Visit Plan  Continue with strengthening and balance    PT Home Exercise Plan  Access Code: [email or web address]    Consulted and Agree with Plan of Care  Patient       Patient will benefit from skilled therapeutic intervention in order to improve the following deficits and impairments:  Abnormal gait, Decreased activity tolerance, Decreased balance, Decreased strength  Visit Diagnosis: Muscle weakness (generalized)  Unsteadiness on feet     Problem List Patient Active Problem List   Diagnosis Date Noted  . Chest pain 03/04/2019  . Anemia due to  antineoplastic chemotherapy 03/03/2018  . Cough 03/03/2018  . Chemotherapy induced neutropenia ([place]) 01/22/2018  . B12 deficiency 01/01/2018  . Hypomagnesemia 12/21/2017  . Encounter for antineoplastic chemotherapy 12/21/2017  . Malignant neoplasm of kidney ([place])   . Urothelial cancer ([place]) 12/18/2017  . Malnutrition of moderate degree 12/18/2017  . Therapeutic opioid induced constipation   . Palliative care encounter   . Dehydration 12/17/2017  . Goals of care, counseling/discussion 12/13/2017  . Urothelial carcinoma of kidney, left ([place]) 12/07/2017  . Right inguinal hernia 07/17/2015  . Family history of colon cancer 07/17/2015  . Allergic rhinitis 11/12/2014  . Airway hyperreactivity 11/12/2014  . Atherosclerosis of coronary artery 11/12/2014  . Cheilitis 11/12/2014  . Narrowing of intervertebral disc space 11/12/2014  . Deflected nasal septum 11/12/2014  . HLD (hyperlipidemia) 11/12/2014  . BP (high blood pressure) 11/12/2014  . Adult hypothyroidism 11/12/2014  . Sleep apnea 11/12/2014  . Diabetes mellitus, type 2 ([place]) 11/12/2014  . Degenerative disc disease, lumbar 09/24/2012  . Furunculosis 04/23/2012   Phillips Grout PT, DPT, GCS  Nelsie Domino 07/22/2019, 1:35 PM  [place] MAIN [facility] SERVICES [address] [place], [place], [address] Phone: [phone]   Fax:  [phone]  Name: Patrick Mcgee MRN: [id number] Date of Birth: 11-Jan-1946

## 2019-07-23 ENCOUNTER — Ambulatory Visit: Payer: Self-pay | Admitting: Family Medicine

## 2019-07-25 ENCOUNTER — Inpatient Hospital Stay: Payer: Medicare Other

## 2019-07-28 ENCOUNTER — Inpatient Hospital Stay: Payer: Medicare Other

## 2019-07-28 ENCOUNTER — Other Ambulatory Visit: Payer: Self-pay

## 2019-07-28 ENCOUNTER — Inpatient Hospital Stay: Payer: Medicare Other | Admitting: *Deleted

## 2019-07-28 VITALS — BP 111/78 | HR 91 | Temp 95.2°F | Wt 157.4 lb

## 2019-07-28 DIAGNOSIS — C772 Secondary and unspecified malignant neoplasm of intra-abdominal lymph nodes: Secondary | ICD-10-CM | POA: Diagnosis not present

## 2019-07-28 DIAGNOSIS — C642 Malignant neoplasm of left kidney, except renal pelvis: Secondary | ICD-10-CM

## 2019-07-28 DIAGNOSIS — D701 Agranulocytosis secondary to cancer chemotherapy: Secondary | ICD-10-CM

## 2019-07-28 DIAGNOSIS — Z5112 Encounter for antineoplastic immunotherapy: Secondary | ICD-10-CM | POA: Diagnosis not present

## 2019-07-28 DIAGNOSIS — T451X5A Adverse effect of antineoplastic and immunosuppressive drugs, initial encounter: Secondary | ICD-10-CM

## 2019-07-28 DIAGNOSIS — Z95828 Presence of other vascular implants and grafts: Secondary | ICD-10-CM

## 2019-07-28 LAB — CBC WITH DIFFERENTIAL/PLATELET
Abs Immature Granulocytes: 0.01 10*3/uL (ref 0.00–0.07)
Basophils Absolute: 0.1 10*3/uL (ref 0.0–0.1)
Basophils Relative: 1 %
Eosinophils Absolute: 0.3 10*3/uL (ref 0.0–0.5)
Eosinophils Relative: 4 %
HCT: 39.8 % (ref 39.0–52.0)
Hemoglobin: 12.7 g/dL — ABNORMAL LOW (ref 13.0–17.0)
Immature Granulocytes: 0 %
Lymphocytes Relative: 15 %
Lymphs Abs: 1.1 10*3/uL (ref 0.7–4.0)
MCH: 27.7 pg (ref 26.0–34.0)
MCHC: 31.9 g/dL (ref 30.0–36.0)
MCV: 86.7 fL (ref 80.0–100.0)
Monocytes Absolute: 0.3 10*3/uL (ref 0.1–1.0)
Monocytes Relative: 4 %
Neutro Abs: 5.6 10*3/uL (ref 1.7–7.7)
Neutrophils Relative %: 76 %
Platelets: 211 10*3/uL (ref 150–400)
RBC: 4.59 MIL/uL (ref 4.22–5.81)
RDW: 16.1 % — ABNORMAL HIGH (ref 11.5–15.5)
WBC: 7.3 10*3/uL (ref 4.0–10.5)
nRBC: 0 % (ref 0.0–0.2)

## 2019-07-28 LAB — COMPREHENSIVE METABOLIC PANEL
ALT: 14 U/L (ref 0–44)
AST: 19 U/L (ref 15–41)
Albumin: 4 g/dL (ref 3.5–5.0)
Alkaline Phosphatase: 82 U/L (ref 38–126)
Anion gap: 11 (ref 5–15)
BUN: 23 mg/dL (ref 8–23)
CO2: 25 mmol/L (ref 22–32)
Calcium: 9.8 mg/dL (ref 8.9–10.3)
Chloride: 100 mmol/L (ref 98–111)
Creatinine, Ser: 0.78 mg/dL (ref 0.61–1.24)
GFR calc Af Amer: >60 mL/min (ref >60)
GFR calc non Af Amer: >60 mL/min (ref >60)
Glucose, Bld: 225 mg/dL — ABNORMAL HIGH (ref 70–99)
Potassium: 4.3 mmol/L (ref 3.5–5.1)
Sodium: 136 mmol/L (ref 135–145)
Total Bilirubin: 1.1 mg/dL (ref 0.3–1.2)
Total Protein: 7.3 g/dL (ref 6.5–8.1)

## 2019-07-28 MED ORDER — SODIUM CHLORIDE 0.9 % IV SOLN
Freq: Once | INTRAVENOUS | Status: AC
  Filled 2019-07-28: qty 250

## 2019-07-28 MED ORDER — HEPARIN SOD (PORK) LOCK FLUSH 100 UNIT/ML IV SOLN
INTRAVENOUS | Status: AC
  Filled 2019-07-28: qty 5

## 2019-07-28 MED ORDER — SODIUM CHLORIDE 0.9% FLUSH
10.0000 mL | Freq: Once | INTRAVENOUS | Status: AC
  Administered 2019-07-28: 10 mL via INTRAVENOUS
  Filled 2019-07-28: qty 10

## 2019-07-28 MED ORDER — DEXAMETHASONE SODIUM PHOSPHATE 10 MG/ML IJ SOLN
10.0000 mg | Freq: Once | INTRAMUSCULAR | Status: DC
  Filled 2019-07-28: qty 1

## 2019-07-28 MED ORDER — HEPARIN SOD (PORK) LOCK FLUSH 100 UNIT/ML IV SOLN
500.0000 [IU] | Freq: Once | INTRAVENOUS | Status: AC | PRN
  Administered 2019-07-28: 500 [IU]
  Filled 2019-07-28: qty 5

## 2019-07-28 MED ORDER — SODIUM CHLORIDE 0.9 % IV SOLN
1.0000 mg/kg | Freq: Once | INTRAVENOUS | Status: AC
  Administered 2019-07-28: 09:00:00 70 mg via INTRAVENOUS
  Filled 2019-07-28: qty 3

## 2019-07-28 MED ORDER — PALONOSETRON HCL INJECTION 0.25 MG/5ML
0.2500 mg | Freq: Once | INTRAVENOUS | Status: AC
  Administered 2019-07-28: 09:00:00 0.25 mg via INTRAVENOUS
  Filled 2019-07-28: qty 5

## 2019-07-30 ENCOUNTER — Encounter: Payer: Self-pay | Admitting: Physical Therapy

## 2019-07-30 ENCOUNTER — Other Ambulatory Visit: Payer: Self-pay

## 2019-07-30 ENCOUNTER — Ambulatory Visit: Payer: Medicare Other | Admitting: Physical Therapy

## 2019-07-30 DIAGNOSIS — M6281 Muscle weakness (generalized): Secondary | ICD-10-CM

## 2019-07-30 DIAGNOSIS — R2681 Unsteadiness on feet: Secondary | ICD-10-CM

## 2019-07-30 NOTE — Therapy (Signed)
[place] MAIN [facility] SERVICES [address] [place], [place], [address] Phone: [phone]   Fax:  [phone]  Physical Therapy Treatment  Patient Details  Name: Patrick Mcgee MRN: [id number] Date of Birth: [birth year] Referring Provider (PT): Dr. Rosanna Randy   Encounter Date: 07/30/2019  PT End of Session - 07/30/19 1528    Visit Number  14    Number of Visits  25    Date for PT Re-Evaluation  08/25/19    PT Start Time  0315    PT Stop Time  0355    PT Time Calculation (min)  40 min    Equipment Utilized During Treatment  Gait belt    Activity Tolerance  Patient tolerated treatment well    Behavior During Therapy  [facility] for tasks assessed/performed       Past Medical History:  Diagnosis Date  . Acid reflux   . Anxiety   . Depression   . Diabetes mellitus without complication ([place])   . Hyperlipidemia   . Hypertension   . MRSA (methicillin resistant Staphylococcus aureus) infection 1992   history  . Myocardial infarction ([place]) 1995  . Sleep apnea    CPAP  . Urothelial cancer ([place]) 11/2017   Left Urothelial mass, chemo tx's    Past Surgical History:  Procedure Laterality Date  . CATARACT EXTRACTION Left   . COLONOSCOPY  2010   [facility]  . COLONOSCOPY WITH PROPOFOL N/A 10/04/2016   Procedure: COLONOSCOPY WITH PROPOFOL;  Surgeon: [name], MD;  Location: [facility] ENDOSCOPY;  Service: Endoscopy;  Laterality: N/A;  . [place], 2000, 2001, 2014  . CYSTOSCOPY W/ RETROGRADES Bilateral 12/10/2017   Procedure: CYSTOSCOPY WITH RETROGRADE PYELOGRAM;  Surgeon: [name], MD;  Location: [facility] ORS;  Service: Urology;  Laterality: Bilateral;  . CYSTOSCOPY W/ RETROGRADES Left 12/09/2018   Procedure: CYSTOSCOPY WITH RETROGRADE PYELOGRAM;  Surgeon: [name], MD;  Location: [facility] ORS;  Service: Urology;  Laterality: Left;  . CYSTOSCOPY WITH BIOPSY Left 12/09/2018   Procedure: CYSTOSCOPY WITH  URETERAL/RENAL PELVIC BIOPSY;  Surgeon: [name], MD;  Location: [facility] ORS;  Service: Urology;  Laterality: Left;  . CYSTOSCOPY WITH STENT PLACEMENT Left 12/10/2017   Procedure: CYSTOSCOPY WITH STENT PLACEMENT;  Surgeon: [name], MD;  Location: [facility] ORS;  Service: Urology;  Laterality: Left;  . CYSTOSCOPY WITH STENT PLACEMENT Left 12/09/2018   Procedure: CYSTOSCOPY WITH STENT PLACEMENT;  Surgeon: [name], MD;  Location: [facility] ORS;  Service: Urology;  Laterality: Left;  . CYSTOSCOPY WITH URETEROSCOPY Left 12/09/2018   Procedure: CYSTOSCOPY WITH URETEROSCOPY;  Surgeon: [name], MD;  Location: [facility] ORS;  Service: Urology;  Laterality: Left;  . EYE SURGERY Bilateral    cataract  . INGUINAL HERNIA REPAIR Right 08/09/2015   Procedure: HERNIA REPAIR INGUINAL ADULT;  Surgeon: [name], MD;  Location: [facility] ORS;  Service: General;  Laterality: Right;  . NASAL SINUS SURGERY    . nuclear stress test    . PORTA CATH INSERTION N/A 12/19/2017   Procedure: PORTA CATH INSERTION;  Surgeon: [name], MD;  Location: [place] CV LAB;  Service: Cardiovascular;  Laterality: N/A;  . URETERAL BIOPSY Left 12/10/2017   Procedure: URETERAL & renal PELVIS BIOPSY;  Surgeon: [name], MD;  Location: [facility] ORS;  Service: Urology;  Laterality: Left;  . URETEROSCOPY Left 12/10/2017   Procedure: URETEROSCOPY;  Surgeon: [name], MD;  Location: [facility] ORS;  Service: Urology;  Laterality: Left;  There were no vitals filed for this visit.  Subjective Assessment - 07/30/19 1528    Subjective  He denies any pain today. No specific questions or concerns upon arrival.    Pertinent History  Part of history borrowed from oncology note and verified with patient: Patient is a 74 year old male who was referred for foot drop. He started with Dr. [name] so far for his metastatic urothelial carcinoma.  This was originally diagnosed in May 2019. He was started on chemotherapy in June 2019 and  was continued on 05/27/2018 for 8 cycles.  He tolerated chemotherapy well except for chemo-induced anemia for which she has been getting Procrit every 2 weeks.  He did have response to his disease based on scans in August 2019.  However repeat scan on 05/31/2018 showed increase in the size of the primary tumor from 1.2 to 1.9 cm and increase in left para-aortic adenopathy from 0.9 to 1.3 cm.  Second line immunotherapy was recommended.  His initial biopsy specimen did not have enough sample to undergo FGFR mutation testing. He also has mild interstitial lung disease for which he sees pulmonary but he is not on home oxygen.  He has B12 deficiency for which he is on oral B12.  Also has diabetes and coronary artery disease. Patient noted to have disease progression in his lymph nodes in May 2020.  Repeat biopsy showed metastatic urothelial carcinoma which did not have a FGFR mutation.  He has been started on third line Padcev. There is still times when he feels fatigued but he is having more good days. He reports poor appetite. He was working with physical therapy on his foot drop, RLE weakness, and unsteadiness. He states that he gets considerable numbness for the 7-8 days following his chemotherapy treatment. He states that he is a diabetic and did have some numbness prior to starting chemotherapy. He states that he has a history of a low back disc bulge with RLE radicular symptoms. He used to get spinal injections but reports he hasn't had any injections in the last 10 years.    Limitations  Walking       Neuromuscular Re-education  Octane fitness x 5 mins L4 Side stepping on balance beam  without UE support x 2 lengths, 50% posterior lean Heel/toe raises without UE support 3s hold x 10 each 1/2 foam roll balance with flat side up 30s x 2 reps, needs UE support 1/2 foam roll balance with flat side down 30s x 2 reps, needs UE support Fwd/bwd steps from floor to 4 inch stool left and right x 15 Lateral side  steps from floor to 4 inch stool left and right x 15 Backwards stepping from foam to 4 inch stool x 15  Leg press 40 lbs x 20 x 3        Pt educated throughout session about proper posture and technique with exercises. Improved exercise technique, movement at target joints, use of target muscles after min to mod verbal, visual, tactile cues. CGA and Min to mod verbal cues used throughout with increased in postural sway and LOB most seen with narrow base of support and while on uneven surfaces. Continues to have balance deficits typical with diagnosis. Patient performs intermediate level exercises without pain behaviors and needs verbal cuing for postural alignment and head positioning                      PT Education - 07/30/19 1528    Education Details  HEP    Person(s) Educated  Patient    Methods  Explanation    Comprehension  Verbalized understanding;Returned demonstration;Verbal cues required;Tactile cues required       PT Short Term Goals - 07/07/19 1026      PT SHORT TERM GOAL #1   Title  Pt will be independent with HEP in order to improve strength and balance in order to decrease fall risk and improve function at home    Time  6    Period  Weeks    Status  On-going    Target Date  07/14/19        PT Long Term Goals - 07/09/19 1236      PT LONG TERM GOAL #1   Title  Pt will improve BERG by at least 3 points in order to demonstrate clinically significant improvement in balance.    Baseline  06/02/19: 48/56; 07/08/19: 54/56    Time  12    Period  Weeks    Status  Achieved      PT LONG TERM GOAL #2   Title  Pt will improve ABC by at least 13% in order to demonstrate clinically significant improvement in balance confidence.    Baseline  06/02/19: 64.4%; 07/07/19: 65%    Time  12    Period  Weeks    Status  On-going    Target Date  08/25/19      PT LONG TERM GOAL #3   Title  Pt will increase 30s Sit to Stand Test to greater than or equal to 14  repetitions in order to demonstrate clinically significant improvement in LE endurance and decrease in fall risk.    Baseline  06/02/19: 10 repetitions; 07/07/19: 13 repetitions    Time  12    Period  Weeks    Status  Partially Met    Target Date  08/25/19      PT LONG TERM GOAL #4   Title  Pt will increase 6MWT by at least 144m(3254f in order to demonstrate clinically significant improvement in cardiopulmonary endurance and community ambulation    Baseline  06/10/20: 137536fno AD, no LOB, Rt foot slap without flat foot strike; 07/07/19: test terminated 2/2 fall; 07/09/19: 1565' with R AFO, no assistive device, mask donned;    Time  12    Period  Weeks    Status  Revised    Target Date  08/25/19            Plan - 07/30/19 1529    Clinical Impression Statement Patient instructed in intermediate strengthening and balance exercise.  Patient requires min Vcs for correct exercise technique including to improve LE weight shifting control with standing exercise. Patient demonstrates poor static and dynamic standing balance on uneven surfaces.. Patient would benefit from additional skilled PT intervention to improve balance/gait safety and reduce fall risk.   Personal Factors and Comorbidities  Age;Comorbidity 3+;Time since onset of injury/illness/exacerbation    Comorbidities  Urothelial cancer, DM, previous lumbar disc bulge, interstitial pulmonary disease    Examination-Activity Limitations  Locomotion Level;Stairs;Transfers    Examination-Participation Restrictions  Community Activity;Laundry;Shop    Rehab Potential  Fair    PT Frequency  2x / week    PT Duration  12 weeks    PT Treatment/Interventions  ADLs/Self Care Home Management;Aquatic Therapy;Biofeedback;Canalith Repostioning;Cryotherapy;Iontophoresis 4mg79m Dexamethasone;Moist Heat;Traction;Ultrasound;DME Instruction;Gait training;Stair training;Functional mobility training;Therapeutic activities;Therapeutic exercise;Balance  training;Neuromuscular re-education;Patient/family education;Orthotic Fit/Training;Manual techniques;Dry needling;Energy conservation;Vestibular;Joint Manipulations    PT Next Visit Plan  Continue with strengthening and balance    PT Home Exercise Plan  Access Code: [email or web address]    Consulted and Agree with Plan of Care  Patient       Patient will benefit from skilled therapeutic intervention in order to improve the following deficits and impairments:  Abnormal gait, Decreased activity tolerance, Decreased balance, Decreased strength  Visit Diagnosis: Unsteadiness on feet  Muscle weakness (generalized)     Problem List Patient Active Problem List   Diagnosis Date Noted  . Chest pain 03/04/2019  . Anemia due to antineoplastic chemotherapy 03/03/2018  . Cough 03/03/2018  . Chemotherapy induced neutropenia ([place]) 01/22/2018  . B12 deficiency 01/01/2018  . Hypomagnesemia 12/21/2017  . Encounter for antineoplastic chemotherapy 12/21/2017  . Malignant neoplasm of kidney ([place])   . Urothelial cancer ([place]) 12/18/2017  . Malnutrition of moderate degree 12/18/2017  . Therapeutic opioid induced constipation   . Palliative care encounter   . Dehydration 12/17/2017  . Goals of care, counseling/discussion 12/13/2017  . Urothelial carcinoma of kidney, left ([place]) 12/07/2017  . Right inguinal hernia 07/17/2015  . Family history of colon cancer 07/17/2015  . Allergic rhinitis 11/12/2014  . Airway hyperreactivity 11/12/2014  . Atherosclerosis of coronary artery 11/12/2014  . Cheilitis 11/12/2014  . Narrowing of intervertebral disc space 11/12/2014  . Deflected nasal septum 11/12/2014  . HLD (hyperlipidemia) 11/12/2014  . BP (high blood pressure) 11/12/2014  . Adult hypothyroidism 11/12/2014  . Sleep apnea 11/12/2014  . Diabetes mellitus, type 2 ([place]) 11/12/2014  . Degenerative disc disease, lumbar 09/24/2012  . Furunculosis 04/23/2012    Alanson Puls, PT DPT 07/30/2019, 3:30  PM  [place] MAIN [facility] SERVICES [address] [place], [place], [address] Phone: [phone]   Fax:  [phone]  Name: CAMARION WEIER MRN: [id number] Date of Birth: 1945/08/20

## 2019-07-31 ENCOUNTER — Inpatient Hospital Stay: Payer: Medicare Other

## 2019-07-31 NOTE — Progress Notes (Signed)
Nutrition Follow-up:  Patient with metastatic urothelial cancer currently on chemotherapy.  Patient followed by Dr. Janese Mcgee.    Spoke with patient via phone.  Patient reports good news from recent scans.  Planning to continue treatment.  Patient reports appetite has improved.  Patient continues to drink premier protein shakes.  Wife helps prepare well balanced meals.       Medications: reviewed  Labs: reviewed  Anthropometrics:   Weight increased to 157 lb 6.4 oz on 1/18 from 151 lb on 12/8    NUTRITION DIAGNOSIS: Inadequate oral intake improved with weight gain   INTERVENTION:  Encouraged patient to continue focusing on good calories and protein to prevent weight loss.   Patient to continue with drinking premier protein shakes Contact information provided to patient and patient will contact RD if needed in the future.   NEXT VISIT: no follow-up.  Patient will call RD if needed  Patrick Mcgee B. Patrick Mcgee, [place], [place] Registered Dietitian [phone] (pager)

## 2019-08-04 ENCOUNTER — Other Ambulatory Visit: Payer: Self-pay

## 2019-08-04 ENCOUNTER — Ambulatory Visit: Payer: Medicare Other

## 2019-08-04 DIAGNOSIS — M6281 Muscle weakness (generalized): Secondary | ICD-10-CM

## 2019-08-04 DIAGNOSIS — R2681 Unsteadiness on feet: Secondary | ICD-10-CM

## 2019-08-04 NOTE — Therapy (Signed)
[place] MAIN [facility] SERVICES [address] [place], [place], [address] Phone: [phone]   Fax:  [phone]  Physical Therapy Treatment  Patient Details  Name: Patrick Mcgee MRN: [id number] Date of Birth: [birth year] Referring Provider (PT): Dr. Rosanna Randy   Encounter Date: 08/04/2019  PT End of Session - 08/04/19 1505    Visit Number  15    Number of Visits  25    Date for PT Re-Evaluation  08/25/19    PT Start Time  1510    PT Stop Time  1555    PT Time Calculation (min)  45 min    Equipment Utilized During Treatment  Gait belt    Activity Tolerance  Patient tolerated treatment well    Behavior During Therapy  [facility] for tasks assessed/performed       Past Medical History:  Diagnosis Date  . Acid reflux   . Anxiety   . Depression   . Diabetes mellitus without complication ([place])   . Hyperlipidemia   . Hypertension   . MRSA (methicillin resistant Staphylococcus aureus) infection 1992   history  . Myocardial infarction ([place]) 1995  . Sleep apnea    CPAP  . Urothelial cancer ([place]) 11/2017   Left Urothelial mass, chemo tx's    Past Surgical History:  Procedure Laterality Date  . CATARACT EXTRACTION Left   . COLONOSCOPY  2010   [facility]  . COLONOSCOPY WITH PROPOFOL N/A 10/04/2016   Procedure: COLONOSCOPY WITH PROPOFOL;  Surgeon: [name], MD;  Location: [facility] ENDOSCOPY;  Service: Endoscopy;  Laterality: N/A;  . [place], 2000, 2001, 2014  . CYSTOSCOPY W/ RETROGRADES Bilateral 12/10/2017   Procedure: CYSTOSCOPY WITH RETROGRADE PYELOGRAM;  Surgeon: [name], MD;  Location: [facility] ORS;  Service: Urology;  Laterality: Bilateral;  . CYSTOSCOPY W/ RETROGRADES Left 12/09/2018   Procedure: CYSTOSCOPY WITH RETROGRADE PYELOGRAM;  Surgeon: [name], MD;  Location: [facility] ORS;  Service: Urology;  Laterality: Left;  . CYSTOSCOPY WITH BIOPSY Left 12/09/2018   Procedure: CYSTOSCOPY WITH  URETERAL/RENAL PELVIC BIOPSY;  Surgeon: [name], MD;  Location: [facility] ORS;  Service: Urology;  Laterality: Left;  . CYSTOSCOPY WITH STENT PLACEMENT Left 12/10/2017   Procedure: CYSTOSCOPY WITH STENT PLACEMENT;  Surgeon: [name], MD;  Location: [facility] ORS;  Service: Urology;  Laterality: Left;  . CYSTOSCOPY WITH STENT PLACEMENT Left 12/09/2018   Procedure: CYSTOSCOPY WITH STENT PLACEMENT;  Surgeon: [name], MD;  Location: [facility] ORS;  Service: Urology;  Laterality: Left;  . CYSTOSCOPY WITH URETEROSCOPY Left 12/09/2018   Procedure: CYSTOSCOPY WITH URETEROSCOPY;  Surgeon: [name], MD;  Location: [facility] ORS;  Service: Urology;  Laterality: Left;  . EYE SURGERY Bilateral    cataract  . INGUINAL HERNIA REPAIR Right 08/09/2015   Procedure: HERNIA REPAIR INGUINAL ADULT;  Surgeon: [name], MD;  Location: [facility] ORS;  Service: General;  Laterality: Right;  . NASAL SINUS SURGERY    . nuclear stress test    . PORTA CATH INSERTION N/A 12/19/2017   Procedure: PORTA CATH INSERTION;  Surgeon: [name], MD;  Location: [place] CV LAB;  Service: Cardiovascular;  Laterality: N/A;  . URETERAL BIOPSY Left 12/10/2017   Procedure: URETERAL & renal PELVIS BIOPSY;  Surgeon: [name], MD;  Location: [facility] ORS;  Service: Urology;  Laterality: Left;  . URETEROSCOPY Left 12/10/2017   Procedure: URETEROSCOPY;  Surgeon: [name], MD;  Location: [facility] ORS;  Service: Urology;  Laterality: Left;  There were no vitals filed for this visit.  Subjective Assessment - 08/04/19 1504    Subjective  Pt denies any pain today. No specific questions or concerns upon arrival. No falls or stumbles since last therapy session. Pt had his chemotherapy treatment last Monday and he reports that he is still having some fatigue.    Pertinent History  Part of history borrowed from oncology note and verified with patient: Patient is a 74 year old male who was referred for foot drop. He started with Dr.  [name] so far for his metastatic urothelial carcinoma.  This was originally diagnosed in May 2019. He was started on chemotherapy in June 2019 and was continued on 05/27/2018 for 8 cycles.  He tolerated chemotherapy well except for chemo-induced anemia for which she has been getting Procrit every 2 weeks.  He did have response to his disease based on scans in August 2019.  However repeat scan on 05/31/2018 showed increase in the size of the primary tumor from 1.2 to 1.9 cm and increase in left para-aortic adenopathy from 0.9 to 1.3 cm.  Second line immunotherapy was recommended.  His initial biopsy specimen did not have enough sample to undergo FGFR mutation testing. He also has mild interstitial lung disease for which he sees pulmonary but he is not on home oxygen.  He has B12 deficiency for which he is on oral B12.  Also has diabetes and coronary artery disease. Patient noted to have disease progression in his lymph nodes in May 2020.  Repeat biopsy showed metastatic urothelial carcinoma which did not have a FGFR mutation.  He has been started on third line Padcev. There is still times when he feels fatigued but he is having more good days. He reports poor appetite. He was working with physical therapy on his foot drop, RLE weakness, and unsteadiness. He states that he gets considerable numbness for the 7-8 days following his chemotherapy treatment. He states that he is a diabetic and did have some numbness prior to starting chemotherapy. He states that he has a history of a low back disc bulge with RLE radicular symptoms. He used to get spinal injections but reports he hasn't had any injections in the last 10 years.    Limitations  Walking    Currently in Pain?  No/denies          TREATMENT   Ther-ex Cross trainer intervals L4/8, 45s intervals, x 6 minutes (1 minute cool down), therapist monitoring for fatigue; Precor BLE leg press 25# x 20, 40# x 20, 55# x 20; Precor BLE heel raise 40# 2 x  20; Sit to stand from regular height chair x 10 without UE support; Sit to stand from regular height chair without UE support with Airex under feet x 5; Standing hip flexion march, hip extension, hip abduction, and HS curls with 4# ankle weights x 20 each;    Neuromuscular Re-education  1/2 foam roll static balance 30s x 2; 1/2 foam roll tandem balance alternating forward LE 30s x 2 each; 1/2 foam roll heel/toe rocking x 10 each direction; 6" orange hurdle steps forward/backward x 10, lateral x 10 each direction; 6" orange hurdle steps lateral Airex on both sides x 10 each direction; Airex cone taps alternating LE without UE support x 10 each;   Pt educated throughout session about proper posture and technique with exercises. Improved exercise technique, movement at target joints, use of target muscles after min to mod verbal, visual, tactile cues.   Pt demonstrates excellent  motivation during session today. He is willing to attempt the leg press today however utilized double leg instead of single leg due to difficulty of new machine. Progressed standing exercises today to include 4# ankle weights. He continues to demonstrate R ankle instability especially with eversion weakness. Pt encouraged to continue HEP and follow-up as scheduled. He would benefit from further skilled PT intervention to maximize safety, mobility, and independence.                       PT Short Term Goals - 07/07/19 1026      PT SHORT TERM GOAL #1   Title  Pt will be independent with HEP in order to improve strength and balance in order to decrease fall risk and improve function at home    Time  6    Period  Weeks    Status  On-going    Target Date  07/14/19        PT Long Term Goals - 07/09/19 1236      PT LONG TERM GOAL #1   Title  Pt will improve BERG by at least 3 points in order to demonstrate clinically significant improvement in balance.    Baseline  06/02/19: 48/56;  07/08/19: 54/56    Time  12    Period  Weeks    Status  Achieved      PT LONG TERM GOAL #2   Title  Pt will improve ABC by at least 13% in order to demonstrate clinically significant improvement in balance confidence.    Baseline  06/02/19: 64.4%; 07/07/19: 65%    Time  12    Period  Weeks    Status  On-going    Target Date  08/25/19      PT LONG TERM GOAL #3   Title  Pt will increase 30s Sit to Stand Test to greater than or equal to 14 repetitions in order to demonstrate clinically significant improvement in LE endurance and decrease in fall risk.    Baseline  06/02/19: 10 repetitions; 07/07/19: 13 repetitions    Time  12    Period  Weeks    Status  Partially Met    Target Date  08/25/19      PT LONG TERM GOAL #4   Title  Pt will increase 6MWT by at least 159m(3284f in order to demonstrate clinically significant improvement in cardiopulmonary endurance and community ambulation    Baseline  06/10/20: 137542fno AD, no LOB, Rt foot slap without flat foot strike; 07/07/19: test terminated 2/2 fall; 07/09/19: 1565' with R AFO, no assistive device, mask donned;    Time  12    Period  Weeks    Status  Revised    Target Date  08/25/19            Plan - 08/04/19 1506    Clinical Impression Statement  Pt demonstrates excellent motivation during session today. He is willing to attempt the leg press today however utilized double leg instead of single leg due to difficulty of new machine. Progressed standing exercises today to include 4# ankle weights. He continues to demonstrate R ankle instability especially with eversion weakness. Pt encouraged to continue HEP and follow-up as scheduled. He would benefit from further skilled PT intervention to maximize safety, mobility, and independence.    Personal Factors and Comorbidities  Age;Comorbidity 3+;Time since onset of injury/illness/exacerbation    Comorbidities  Urothelial cancer, DM, previous lumbar disc bulge, interstitial  pulmonary  disease    Examination-Activity Limitations  Locomotion Level;Stairs;Transfers    Examination-Participation Restrictions  Community Activity;Laundry;Shop    Rehab Potential  Fair    PT Frequency  2x / week    PT Duration  12 weeks    PT Treatment/Interventions  ADLs/Self Care Home Management;Aquatic Therapy;Biofeedback;Canalith Repostioning;Cryotherapy;Iontophoresis '4mg'$ /ml Dexamethasone;Moist Heat;Traction;Ultrasound;DME Instruction;Gait training;Stair training;Functional mobility training;Therapeutic activities;Therapeutic exercise;Balance training;Neuromuscular re-education;Patient/family education;Orthotic Fit/Training;Manual techniques;Dry needling;Energy conservation;Vestibular;Joint Manipulations    PT Next Visit Plan  Continue with strengthening and balance    PT Home Exercise Plan  Access Code: [email or web address]    Consulted and Agree with Plan of Care  Patient       Patient will benefit from skilled therapeutic intervention in order to improve the following deficits and impairments:  Abnormal gait, Decreased activity tolerance, Decreased balance, Decreased strength  Visit Diagnosis: Unsteadiness on feet  Muscle weakness (generalized)     Problem List Patient Active Problem List   Diagnosis Date Noted  . Chest pain 03/04/2019  . Anemia due to antineoplastic chemotherapy 03/03/2018  . Cough 03/03/2018  . Chemotherapy induced neutropenia ([place]) 01/22/2018  . B12 deficiency 01/01/2018  . Hypomagnesemia 12/21/2017  . Encounter for antineoplastic chemotherapy 12/21/2017  . Malignant neoplasm of kidney ([place])   . Urothelial cancer ([place]) 12/18/2017  . Malnutrition of moderate degree 12/18/2017  . Therapeutic opioid induced constipation   . Palliative care encounter   . Dehydration 12/17/2017  . Goals of care, counseling/discussion 12/13/2017  . Urothelial carcinoma of kidney, left ([place]) 12/07/2017  . Right inguinal hernia 07/17/2015  . Family history of colon cancer 07/17/2015  .  Allergic rhinitis 11/12/2014  . Airway hyperreactivity 11/12/2014  . Atherosclerosis of coronary artery 11/12/2014  . Cheilitis 11/12/2014  . Narrowing of intervertebral disc space 11/12/2014  . Deflected nasal septum 11/12/2014  . HLD (hyperlipidemia) 11/12/2014  . BP (high blood pressure) 11/12/2014  . Adult hypothyroidism 11/12/2014  . Sleep apnea 11/12/2014  . Diabetes mellitus, type 2 ([place]) 11/12/2014  . Degenerative disc disease, lumbar 09/24/2012  . Furunculosis 04/23/2012   Phillips Grout PT, DPT, GCS  , 08/04/2019, 4:03 PM  [place] MAIN [facility] SERVICES [address] [place], [place], [address] Phone: [phone]   Fax:  [phone]  Name: Patrick Mcgee MRN: [id number] Date of Birth: 05-31-1946

## 2019-08-05 ENCOUNTER — Other Ambulatory Visit: Payer: Self-pay

## 2019-08-06 ENCOUNTER — Ambulatory Visit: Payer: Medicare Other

## 2019-08-06 ENCOUNTER — Inpatient Hospital Stay (HOSPITAL_BASED_OUTPATIENT_CLINIC_OR_DEPARTMENT_OTHER): Payer: Medicare Other | Admitting: Hospice and Palliative Medicine

## 2019-08-06 DIAGNOSIS — C642 Malignant neoplasm of left kidney, except renal pelvis: Secondary | ICD-10-CM

## 2019-08-06 DIAGNOSIS — Z515 Encounter for palliative care: Secondary | ICD-10-CM

## 2019-08-06 NOTE — Progress Notes (Signed)
Virtual Visit via Telephone Note  I connected with Patrick Mcgee on 08/06/19 at  9:30 AM EST by telephone and verified that I am speaking with the correct person using two identifiers.   I discussed the limitations, risks, security and privacy concerns of performing an evaluation and management service by telephone and the availability of in person appointments. I also discussed with the patient that there may be a patient responsible charge related to this service. The patient expressed understanding and agreed to proceed.   History of Present Illness: [name] is a 74 y.o. male with multiple medical problems including metastatic urothelial carcinoma originally diagnosed in May 2019 status post chemotherapy and immunotherapy currently on third line enfortumab.  PMH is also notable for mild interstitial lung disease, diabetes, and CAD status post previous stenting.  Patient was referred to palliative care to help address goals and manage ongoing symptoms.   Observations/Objective: CT of the chest, abdomen, and pelvis on 07/08/2019 revealed significant disease improvement.  Spoke with patient by phone.  He reports doing quite well.  He denies any significant changes or concerns.  He continues to endorse persistent peripheral neuropathy.  He says the gabapentin helped but he is only taking it twice daily.  We discussed increasing the dose to 3 times daily.  We also discussed nonpharmacological strategies including acupuncture, which he had previously tried and had helped but he stopped in December.  Also suggested that he could try capsaicin cream.  Consideration could also be given to rotate him from Zoloft to Cymbalta, which may help as an augmenting medication for the management of peripheral neuropathy.  Assessment and Plan: Metastatic urothelial cancer -significant disease improvement on imaging in December 2020.  Patient continues on maintenance enfortumab and is followed in the clinic  by Dr. Janese Banks.   Peripheral neuropathy -likely treatment related.  On gabapentin 300 mg and is currently taking it twice daily.  I suggested he could increase dosing to 3 times daily.  Could also consider rotating from Zoloft to Cymbalta.  Also discussed acupuncture and capsaicin topical cream.  Follow Up Instructions: Follow-up telephone visit in 1 to 2 months   I discussed the assessment and treatment plan with the patient. The patient was provided an opportunity to ask questions and all were answered. The patient agreed with the plan and demonstrated an understanding of the instructions.   The patient was advised to call back or seek an in-person evaluation if the symptoms worsen or if the condition fails to improve as anticipated.  I provided 10 minutes of non-face-to-face time during this encounter.   Irean Hong, NP

## 2019-08-12 ENCOUNTER — Inpatient Hospital Stay: Payer: Medicare Other

## 2019-08-12 ENCOUNTER — Ambulatory Visit: Payer: Medicare Other | Attending: Family Medicine

## 2019-08-12 ENCOUNTER — Encounter: Payer: Self-pay | Admitting: Oncology

## 2019-08-12 ENCOUNTER — Other Ambulatory Visit: Payer: Self-pay

## 2019-08-12 ENCOUNTER — Inpatient Hospital Stay: Payer: Medicare Other | Attending: Oncology | Admitting: *Deleted

## 2019-08-12 ENCOUNTER — Inpatient Hospital Stay (HOSPITAL_BASED_OUTPATIENT_CLINIC_OR_DEPARTMENT_OTHER): Payer: Medicare Other | Admitting: Oncology

## 2019-08-12 VITALS — BP 121/78 | HR 81 | Temp 95.7°F | Wt 152.9 lb

## 2019-08-12 DIAGNOSIS — G620 Drug-induced polyneuropathy: Secondary | ICD-10-CM

## 2019-08-12 DIAGNOSIS — C642 Malignant neoplasm of left kidney, except renal pelvis: Secondary | ICD-10-CM

## 2019-08-12 DIAGNOSIS — Z5112 Encounter for antineoplastic immunotherapy: Secondary | ICD-10-CM | POA: Insufficient documentation

## 2019-08-12 DIAGNOSIS — Z5111 Encounter for antineoplastic chemotherapy: Secondary | ICD-10-CM | POA: Diagnosis not present

## 2019-08-12 DIAGNOSIS — R2681 Unsteadiness on feet: Secondary | ICD-10-CM | POA: Insufficient documentation

## 2019-08-12 DIAGNOSIS — T451X5A Adverse effect of antineoplastic and immunosuppressive drugs, initial encounter: Secondary | ICD-10-CM

## 2019-08-12 DIAGNOSIS — D701 Agranulocytosis secondary to cancer chemotherapy: Secondary | ICD-10-CM

## 2019-08-12 DIAGNOSIS — M6281 Muscle weakness (generalized): Secondary | ICD-10-CM | POA: Diagnosis not present

## 2019-08-12 DIAGNOSIS — C772 Secondary and unspecified malignant neoplasm of intra-abdominal lymph nodes: Secondary | ICD-10-CM | POA: Diagnosis not present

## 2019-08-12 DIAGNOSIS — Z95828 Presence of other vascular implants and grafts: Secondary | ICD-10-CM

## 2019-08-12 LAB — CBC WITH DIFFERENTIAL/PLATELET
Abs Immature Granulocytes: 0.01 10*3/uL (ref 0.00–0.07)
Basophils Absolute: 0.1 10*3/uL (ref 0.0–0.1)
Basophils Relative: 1 %
Eosinophils Absolute: 0.2 10*3/uL (ref 0.0–0.5)
Eosinophils Relative: 4 %
HCT: 39 % (ref 39.0–52.0)
Hemoglobin: 12.4 g/dL — ABNORMAL LOW (ref 13.0–17.0)
Immature Granulocytes: 0 %
Lymphocytes Relative: 29 %
Lymphs Abs: 1.6 10*3/uL (ref 0.7–4.0)
MCH: 27.4 pg (ref 26.0–34.0)
MCHC: 31.8 g/dL (ref 30.0–36.0)
MCV: 86.3 fL (ref 80.0–100.0)
Monocytes Absolute: 0.6 10*3/uL (ref 0.1–1.0)
Monocytes Relative: 11 %
Neutro Abs: 3 10*3/uL (ref 1.7–7.7)
Neutrophils Relative %: 55 %
Platelets: 225 10*3/uL (ref 150–400)
RBC: 4.52 MIL/uL (ref 4.22–5.81)
RDW: 16 % — ABNORMAL HIGH (ref 11.5–15.5)
WBC: 5.4 10*3/uL (ref 4.0–10.5)
nRBC: 0 % (ref 0.0–0.2)

## 2019-08-12 LAB — COMPREHENSIVE METABOLIC PANEL
ALT: 14 U/L (ref 0–44)
AST: 17 U/L (ref 15–41)
Albumin: 3.9 g/dL (ref 3.5–5.0)
Alkaline Phosphatase: 84 U/L (ref 38–126)
Anion gap: 11 (ref 5–15)
BUN: 23 mg/dL (ref 8–23)
CO2: 25 mmol/L (ref 22–32)
Calcium: 9.8 mg/dL (ref 8.9–10.3)
Chloride: 101 mmol/L (ref 98–111)
Creatinine, Ser: 0.85 mg/dL (ref 0.61–1.24)
GFR calc Af Amer: >60 mL/min (ref >60)
GFR calc non Af Amer: >60 mL/min (ref >60)
Glucose, Bld: 160 mg/dL — ABNORMAL HIGH (ref 70–99)
Potassium: 4 mmol/L (ref 3.5–5.1)
Sodium: 137 mmol/L (ref 135–145)
Total Bilirubin: 0.7 mg/dL (ref 0.3–1.2)
Total Protein: 7.1 g/dL (ref 6.5–8.1)

## 2019-08-12 MED ORDER — SODIUM CHLORIDE 0.9 % IV SOLN
1.0000 mg/kg | Freq: Once | INTRAVENOUS | Status: AC
  Administered 2019-08-12: 11:00:00 70 mg via INTRAVENOUS
  Filled 2019-08-12: qty 3

## 2019-08-12 MED ORDER — HEPARIN SOD (PORK) LOCK FLUSH 100 UNIT/ML IV SOLN
500.0000 [IU] | Freq: Once | INTRAVENOUS | Status: AC | PRN
  Administered 2019-08-12: 500 [IU]
  Filled 2019-08-12: qty 5

## 2019-08-12 MED ORDER — SODIUM CHLORIDE 0.9% FLUSH
10.0000 mL | Freq: Once | INTRAVENOUS | Status: AC
  Administered 2019-08-12: 10 mL via INTRAVENOUS
  Filled 2019-08-12: qty 10

## 2019-08-12 MED ORDER — SODIUM CHLORIDE 0.9 % IV SOLN
Freq: Once | INTRAVENOUS | Status: AC
  Filled 2019-08-12: qty 250

## 2019-08-12 MED ORDER — DEXAMETHASONE SODIUM PHOSPHATE 10 MG/ML IJ SOLN
10.0000 mg | Freq: Once | INTRAMUSCULAR | Status: DC
  Filled 2019-08-12: qty 1

## 2019-08-12 MED ORDER — PALONOSETRON HCL INJECTION 0.25 MG/5ML
0.2500 mg | Freq: Once | INTRAVENOUS | Status: AC
  Administered 2019-08-12: 0.25 mg via INTRAVENOUS
  Filled 2019-08-12: qty 5

## 2019-08-12 NOTE — Progress Notes (Addendum)
Patient: Patrick Mcgee Male    DOB: 1945-08-24   74 y.o.   MRN: [id number] Visit Date: 08/13/2019  Today's Provider: Wilhemena Durie, MD   Chief Complaint  Patient presents with  . Follow-up  . Diabetes  . Hypertension  . Hyperlipidemia   Subjective:     HPI  Patient is feeling well.  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 Essential hypertension From 04/21/2019-no changes made.  Obstructive sleep apnea syndrome From 04/21/2019-Uses CPAP nightly.  Type 2 diabetes mellitus without complication, without long-term current use of insulin ([place]) From 04/21/2019-Last A1c 7.28-month ago 03/24/2019.  Fasting blood sugars averaging 150-160 at home.   Urothelial carcinoma of kidney, left ([place]) From 04/21/2019-May be moving to palliative care in the near future. Per oncology  Other hyperlipidemia From 04/21/2019-Consider stopping atorvastatin in the future.    Allergies  Allergen Reactions  . Sulfa Antibiotics Other (See Comments)    Joint pain  . Ace Inhibitors Cough  . Invokana [Canagliflozin] Other (See Comments)    Leg pain  . Penicillins Rash    Did it involve swelling of the face/tongue/throat, SOB, or low BP? No Did it involve sudden or severe rash/hives, skin peeling, or any reaction on the inside of your mouth or nose? Yes Did you need to seek medical attention at a hospital or doctor's office? Yes When did it last happen?childhood allergy If all above answers are "NO", may proceed with cephalosporin use.      Current Outpatient Medications:  .  aspirin EC 81 MG tablet, Take 81 mg by mouth every evening. , Disp: , Rfl:  .  atorvastatin (LIPITOR) 40 MG tablet, TAKE 1 TABLET BY MOUTH AT BEDTIME, Disp: 90 tablet, Rfl: 4 .  Cholecalciferol (VITAMIN D3) 75 MCG (3000 UT) TABS, Take by mouth., Disp: , Rfl:  .  clobetasol cream (TEMOVATE) [id number] %, Apply 1 application topically as needed  (for skin rash)., Disp: 30 g, Rfl: 1 .  fluticasone (FLONASE) 50 MCG/ACT nasal spray, Use 2 spray(s) in each nostril once daily, Disp: 48 g, Rfl: 3 .  gabapentin (NEURONTIN) 300 MG capsule, Take 1 capsule (300 mg total) by mouth 3 (three) times daily., Disp: 270 capsule, Rfl: 0 .  glucose blood (CONTOUR NEXT TEST) test strip, Check blood sugars 3 times daily., Disp: 300 each, Rfl: 11 .  hydrOXYzine (ATARAX/VISTARIL) 25 MG tablet, Take 1 tablet (25 mg total) by mouth every 8 (eight) hours as needed for itching., Disp: 30 tablet, Rfl: 3 .  Ivermectin (SOOLANTRA) 1 % CREA, Apply 1 application topically daily as needed (rosacea). To face, Disp: , Rfl:  .  JARDIANCE 10 MG TABS tablet, Take 1 tablet by mouth once daily, Disp: 30 tablet, Rfl: 11 .  lidocaine-prilocaine (EMLA) cream, Apply 1 application topically as needed (port access)., Disp: 1 g, Rfl: 3 .  loratadine (CLARITIN) 10 MG tablet, Take 10 mg by mouth daily as needed for allergies. [employer] brand allergy relief, Disp: , Rfl:  .  losartan (COZAAR) 100 MG tablet, Take 1 tablet by mouth once daily, Disp: 90 tablet, Rfl: 3 .  magic mouthwash w/lidocaine SOLN, Take 5 mLs by mouth 4 (four) times daily as needed for mouth pain., Disp: 480 mL, Rfl: 3 .  metFORMIN (GLUCOPHAGE) 1000 MG tablet, TAKE 1 TABLET BY MOUTH TWICE DAILY WITH MEALS, Disp: 180 tablet, Rfl: 0 .  nystatin cream (MYCOSTATIN), Apply topically 2 (  two) times daily., Disp: 15 g, Rfl: 1 .  OLANZapine (ZYPREXA) 10 MG tablet, Take 1 tablet (10 mg total) by mouth at bedtime., Disp: 30 tablet, Rfl: 1 .  sertraline (ZOLOFT) 100 MG tablet, Take 1 tablet (100 mg total) by mouth daily., Disp: 90 tablet, Rfl: 3 .  vitamin B-12 (CYANOCOBALAMIN) 1000 MCG tablet, Take by mouth daily. Unsure dose, Disp: , Rfl:  .  vitamin C (ASCORBIC ACID) 500 MG tablet, Take 1,000-1,500 mg by mouth daily as needed (immune support)., Disp: , Rfl:  .  HYDROcodone-acetaminophen (NORCO/VICODIN) 5-325 MG tablet, Take 1  tablet by mouth 1 day or 1 dose., Disp: , Rfl:  .  magnesium hydroxide (MILK OF MAGNESIA) 400 MG/5ML suspension, Take 15 mLs by mouth daily as needed for mild constipation., Disp: , Rfl:  .  OVER THE COUNTER MEDICATION, Apply 1 application topically daily as needed (pain). Outback topical pain relief, Disp: , Rfl:  .  oxybutynin (DITROPAN) 5 MG tablet, Take 1 tablet by mouth 1 day or 1 dose., Disp: , Rfl:  .  VITAMIN D PO, Take 1 tablet by mouth 1 day or 1 dose., Disp: , Rfl:  No current facility-administered medications for this visit.  Facility-Administered Medications Ordered in Other Visits:  .  Influenza vac split quadrivalent PF (FLUZONE HIGH-DOSE) injection 0.5 mL, 0.5 mL, Intramuscular, Once, Karen Kitchens, NP  Review of Systems  Constitutional: Negative for appetite change, chills and fever.  HENT: Negative.   Eyes: Negative.   Respiratory: Negative for chest tightness, shortness of breath and wheezing.   Cardiovascular: Negative for chest pain and palpitations.  Gastrointestinal: Negative for abdominal pain, nausea and vomiting.  Endocrine: Negative.   Allergic/Immunologic: Negative.   Neurological: Positive for numbness.  Hematological: Negative.   Psychiatric/Behavioral: Negative.     Social History   Tobacco Use  . Smoking status: Former Smoker    Types: Cigars    Quit date: 10/09/1978    Years since quitting: 40.8  . Smokeless tobacco: Former Systems developer    Types: Chew    Quit date: 12/04/1988  . Tobacco comment: on occasion  Substance Use Topics  . Alcohol use: Not Currently      Objective:   BP 94/64 (BP Location: Right Arm, Patient Position: Sitting, Cuff Size: Large)   Pulse 94   Temp (!) 96.8 F (36 C) (Other (Comment))   Resp 18   Ht 5\' 8"  (1.727 m)   Wt 157 lb (71.2 kg)   SpO2 97%   BMI 23.87 kg/m  Vitals:   08/13/19 1335  BP: 94/64  Pulse: 94  Resp: 18  Temp: (!) 96.8 F (36 C)  TempSrc: Other (Comment)  SpO2: 97%  Weight: 157 lb (71.2 kg)    Height: 5\' 8"  (1.727 m)  Body mass index is 23.87 kg/m.   Physical Exam Vitals reviewed.  HENT:     Head: Normocephalic and atraumatic.     Right Ear: External ear normal.     Left Ear: External ear normal.  Eyes:     General: No scleral icterus.    Conjunctiva/sclera: Conjunctivae normal.  Cardiovascular:     Rate and Rhythm: Normal rate and regular rhythm.  Pulmonary:     Breath sounds: Normal breath sounds.  Abdominal:     Palpations: Abdomen is soft.  Lymphadenopathy:     Cervical: No cervical adenopathy.  Skin:    General: Skin is warm and dry.  Neurological:     General: No focal deficit  present.     Mental Status: He is alert and oriented to person, place, and time.  Psychiatric:        Mood and Affect: Mood normal.        Behavior: Behavior normal.        Thought Content: Thought content normal.        Judgment: Judgment normal.      No results found for any visits on 08/13/19.     Assessment & Plan    1. Type 2 diabetes mellitus without complication, without long-term current use of insulin (H[facility]) No changes today with fair control diabetes. - POCT glycosylated hemoglobin (Hb A1C) 8.2 today.  2. Essential hypertension Decrease losartan 100 mg daily to 50 mg daily.  Blood pressures better as he has lost weight. 3. Atherosclerosis of native coronary artery of native heart with angina pectoris ([place]) All risk factors treated  4. Obstructive sleep apnea syndrome   5. Urothelial carcinoma of kidney, left (H[facility]) Presently in remission.  Followed by oncology. Patient had a second Covid vaccine 1 week ago. 6.  Foot drop Chemotherapy.  Needs AFO. Follow up in 3-4 months.     I,Merrik Puebla,acting as a scribe for Wilhemena Durie, MD.,have documented all relevant documentation on the behalf of Wilhemena Durie, MD,as directed by  Wilhemena Durie, MD while in the presence of Wilhemena Durie, MD.      Wilhemena Durie, MD  [facility]

## 2019-08-12 NOTE — Progress Notes (Signed)
1040: Per Pt "I do not take the Steroids anymore, they increase my blood sugar". Dr. Janese Banks aware, and Per Dr. Janese Banks okay to proceed with scheduled treatment, no other orders at this time.

## 2019-08-12 NOTE — Therapy (Signed)
[place] MAIN [facility] SERVICES [address] [place], [place], [address] Phone: [phone]   Fax:  [phone]  Physical Therapy Treatment  Patient Details  Name: Patrick Mcgee MRN: [id number] Date of Birth: 07/02/1946 Referring Provider (PT): Dr. Rosanna Mcgee   Encounter Date: 08/12/2019  PT End of Session - 08/12/19 0952    Visit Number  16    Number of Visits  25    Date for PT Re-Evaluation  08/25/19    PT Start Time  0945    PT Stop Time  1015    PT Time Calculation (min)  30 min    Equipment Utilized During Treatment  Gait belt    Activity Tolerance  Patient tolerated treatment well    Behavior During Therapy  [facility] for tasks assessed/performed       Past Medical History:  Diagnosis Date  . Acid reflux   . Anxiety   . Depression   . Diabetes mellitus without complication ([place])   . Hyperlipidemia   . Hypertension   . MRSA (methicillin resistant Staphylococcus aureus) infection 1992   history  . Myocardial infarction ([place]) 1995  . Sleep apnea    CPAP  . Urothelial cancer ([employer]) 11/2017   Left Urothelial mass, chemo tx's    Past Surgical History:  Procedure Laterality Date  . CATARACT EXTRACTION Left   . COLONOSCOPY  2010   [facility]  . COLONOSCOPY WITH PROPOFOL N/A 10/04/2016   Procedure: COLONOSCOPY WITH PROPOFOL;  Surgeon: [name], MD;  Location: [facility] ENDOSCOPY;  Service: Endoscopy;  Laterality: N/A;  . [place], 2000, 2001, 2014  . CYSTOSCOPY W/ RETROGRADES Bilateral 12/10/2017   Procedure: CYSTOSCOPY WITH RETROGRADE PYELOGRAM;  Surgeon: [name], MD;  Location: [facility] ORS;  Service: Urology;  Laterality: Bilateral;  . CYSTOSCOPY W/ RETROGRADES Left 12/09/2018   Procedure: CYSTOSCOPY WITH RETROGRADE PYELOGRAM;  Surgeon: [name], MD;  Location: [facility] ORS;  Service: Urology;  Laterality: Left;  . CYSTOSCOPY WITH BIOPSY Left 12/09/2018   Procedure: CYSTOSCOPY WITH  URETERAL/RENAL PELVIC BIOPSY;  Surgeon: [name], MD;  Location: [facility] ORS;  Service: Urology;  Laterality: Left;  . CYSTOSCOPY WITH STENT PLACEMENT Left 12/10/2017   Procedure: CYSTOSCOPY WITH STENT PLACEMENT;  Surgeon: [name], MD;  Location: [facility] ORS;  Service: Urology;  Laterality: Left;  . CYSTOSCOPY WITH STENT PLACEMENT Left 12/09/2018   Procedure: CYSTOSCOPY WITH STENT PLACEMENT;  Surgeon: [name], MD;  Location: [facility] ORS;  Service: Urology;  Laterality: Left;  . CYSTOSCOPY WITH URETEROSCOPY Left 12/09/2018   Procedure: CYSTOSCOPY WITH URETEROSCOPY;  Surgeon: [name], MD;  Location: [facility] ORS;  Service: Urology;  Laterality: Left;  . EYE SURGERY Bilateral    cataract  . INGUINAL HERNIA REPAIR Right 08/09/2015   Procedure: HERNIA REPAIR INGUINAL ADULT;  Surgeon: [name], MD;  Location: [facility] ORS;  Service: General;  Laterality: Right;  . NASAL SINUS SURGERY    . nuclear stress test    . PORTA CATH INSERTION N/A 12/19/2017   Procedure: PORTA CATH INSERTION;  Surgeon: [name], MD;  Location: [facility] CV LAB;  Service: Cardiovascular;  Laterality: N/A;  . URETERAL BIOPSY Left 12/10/2017   Procedure: URETERAL & renal PELVIS BIOPSY;  Surgeon: [name], MD;  Location: [facility] ORS;  Service: Urology;  Laterality: Left;  . URETEROSCOPY Left 12/10/2017   Procedure: URETEROSCOPY;  Surgeon: [name], MD;  Location: [facility] ORS;  Service: Urology;  Laterality: Left;  There were no vitals filed for this visit.  Subjective Assessment - 08/12/19 0951    Subjective  Pt denies any pain today. No specific questions or concerns upon arrival. No falls or stumbles since last therapy session. 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    Pertinent History  Part of history borrowed from oncology note and verified with patient: Patient is a 74 year old male who was  referred for foot drop. He started with Dr. Mike Mcgee so far for his metastatic urothelial carcinoma.  This was originally diagnosed in May 2019. He was started on chemotherapy in June 2019 and was continued on 05/27/2018 for 8 cycles.  He tolerated chemotherapy well except for chemo-induced anemia for which she has been getting Procrit every 2 weeks.  He did have response to his disease based on scans in August 2019.  However repeat scan on 05/31/2018 showed increase in the size of the primary tumor from 1.2 to 1.9 cm and increase in left para-aortic adenopathy from 0.9 to 1.3 cm.  Second line immunotherapy was recommended.  His initial biopsy specimen did not have enough sample to undergo FGFR mutation testing. He also has mild interstitial lung disease for which he sees pulmonary but he is not on home oxygen.  He has B12 deficiency for which he is on oral B12.  Also has diabetes and coronary artery disease. Patient noted to have disease progression in his lymph nodes in May 2020.  Repeat biopsy showed metastatic urothelial carcinoma which did not have a FGFR mutation.  He has been started on third line Padcev. There is still times when he feels fatigued but he is having more good days. He reports poor appetite. He was working with physical therapy on his foot drop, RLE weakness, and unsteadiness. He states that he gets considerable numbness for the 7-8 days following his chemotherapy treatment. He states that he is a diabetic and did have some numbness prior to starting chemotherapy. He states that he has a history of a low back disc bulge with RLE radicular symptoms. He used to get spinal injections but reports he hasn't had any injections in the last 10 years.    Limitations  Walking    Currently in Pain?  No/denies         TREATMENT   Ther-ex NuStep L2-3 x 5 minutes for warm-up with therapist adjusting weight as appropriate; Precor BLE leg press 55# x 20, 70# x 20, 75# x 15 Precor BLE heel  raise 55# x 30; Sit to stand from regular height chair without UE support with Airex under feet 2 x 10 with CGA only; Standing hip flexion march, hip extension, hip abduction, and HS curls with 4# ankle weights x 20 each; Seated LAQ with 4# ankle weights x 20 each; Seated clams with manual resistance x 20; Seated adductor squeeze with manual resistance x 20;   Pt educated throughout session about proper posture and technique with exercises. Improved exercise technique, movement at target joints, use of target muscles after min to mod verbal, visual, tactile cues.   Pt arrived late for his therapy due to a conflicting oncology appointment this morning so session was alightly abbreviated. He demonstrates excellent motivation during session today and is able to progress his resistance on the leg press. He is also able to perform additional sit to stands from Airex pad today compared to last session. Focused  on strengthening today due to limited time and pt is making excellent progress. Pt encouraged to continue HEP and follow-up as scheduled. He would benefit from further skilled PT intervention to maximize safety, mobility, and independence.                        PT Short Term Goals - 07/07/19 1026      PT SHORT TERM GOAL #1   Title  Pt will be independent with HEP in order to improve strength and balance in order to decrease fall risk and improve function at home    Time  6    Period  Weeks    Status  On-going    Target Date  07/14/19        PT Long Term Goals - 07/09/19 1236      PT LONG TERM GOAL #1   Title  Pt will improve BERG by at least 3 points in order to demonstrate clinically significant improvement in balance.    Baseline  06/02/19: 48/56; 07/08/19: 54/56    Time  12    Period  Weeks    Status  Achieved      PT LONG TERM GOAL #2   Title  Pt will improve ABC by at least 13% in order to demonstrate clinically significant improvement in balance  confidence.    Baseline  06/02/19: 64.4%; 07/07/19: 65%    Time  12    Period  Weeks    Status  On-going    Target Date  08/25/19      PT LONG TERM GOAL #3   Title  Pt will increase 30s Sit to Stand Test to greater than or equal to 14 repetitions in order to demonstrate clinically significant improvement in LE endurance and decrease in fall risk.    Baseline  06/02/19: 10 repetitions; 07/07/19: 13 repetitions    Time  12    Period  Weeks    Status  Partially Met    Target Date  08/25/19      PT LONG TERM GOAL #4   Title  Pt will increase 6MWT by at least 163m(3254f in order to demonstrate clinically significant improvement in cardiopulmonary endurance and community ambulation    Baseline  06/10/20: [id number] AD, no LOB, Rt foot slap without flat foot strike; 07/07/19: test terminated 2/2 fall; 07/09/19: 1565' with R AFO, no assistive device, mask donned;    Time  12    Period  Weeks    Status  Revised    Target Date  08/25/19            Plan - 08/12/19 [id number] Clinical Impression Statement  Pt arrived late for his therapy due to a conflicting oncology appointment this morning so session was alightly abbreviated. He demonstrates excellent motivation during session today and is able to progress his resistance on the leg press. He is also able to perform additional sit to stands from Airex pad today compared to last session. Focused on strengthening today due to limited time and pt is making excellent progress. Pt encouraged to continue HEP and follow-up as scheduled. He would benefit from further skilled PT intervention to maximize safety, mobility, and independence.    Personal Factors and Comorbidities  Age;Comorbidity 3+;Time since onset of injury/illness/exacerbation    Comorbidities  Urothelial cancer, DM, previous lumbar disc bulge, interstitial pulmonary disease    Examination-Activity Limitations  Locomotion Level;Stairs;Transfers    Examination-Participation  Restrictions   Community Activity;Laundry;Shop    Rehab Potential  Fair    PT Frequency  2x / week    PT Duration  12 weeks    PT Treatment/Interventions  ADLs/Self Care Home Management;Aquatic Therapy;Biofeedback;Canalith Repostioning;Cryotherapy;Iontophoresis '4mg'$ /ml Dexamethasone;Moist Heat;Traction;Ultrasound;DME Instruction;Gait training;Stair training;Functional mobility training;Therapeutic activities;Therapeutic exercise;Balance training;Neuromuscular re-education;Patient/family education;Orthotic Fit/Training;Manual techniques;Dry needling;Energy conservation;Vestibular;Joint Manipulations    PT Next Visit Plan  Continue with strengthening and balance    PT Home Exercise Plan  Access Code: [email or web address]    Consulted and Agree with Plan of Care  Patient       Patient will benefit from skilled therapeutic intervention in order to improve the following deficits and impairments:  Abnormal gait, Decreased activity tolerance, Decreased balance, Decreased strength  Visit Diagnosis: Unsteadiness on feet  Muscle weakness (generalized)     Problem List Patient Active Problem List   Diagnosis Date Noted  . Chest pain 03/04/2019  . Anemia due to antineoplastic chemotherapy 03/03/2018  . Cough 03/03/2018  . Chemotherapy induced neutropenia ([place]) 01/22/2018  . B12 deficiency 01/01/2018  . Hypomagnesemia 12/21/2017  . Encounter for antineoplastic chemotherapy 12/21/2017  . Malignant neoplasm of kidney ([place])   . Urothelial cancer ([place]) 12/18/2017  . Malnutrition of moderate degree 12/18/2017  . Therapeutic opioid induced constipation   . Palliative care encounter   . Dehydration 12/17/2017  . Goals of care, counseling/discussion 12/13/2017  . Urothelial carcinoma of kidney, left ([place]) 12/07/2017  . Right inguinal hernia 07/17/2015  . Family history of colon cancer 07/17/2015  . Allergic rhinitis 11/12/2014  . Airway hyperreactivity 11/12/2014  . Atherosclerosis of coronary artery 11/12/2014  .  Cheilitis 11/12/2014  . Narrowing of intervertebral disc space 11/12/2014  . Deflected nasal septum 11/12/2014  . HLD (hyperlipidemia) 11/12/2014  . BP (high blood pressure) 11/12/2014  . Adult hypothyroidism 11/12/2014  . Sleep apnea 11/12/2014  . Diabetes mellitus, type 2 ([place]) 11/12/2014  . Degenerative disc disease, lumbar 09/24/2012  . Furunculosis 04/23/2012   Phillips Grout PT, DPT, GCS  Naydeline Morace 08/12/2019, 11:31 AM  [place] MAIN [facility] SERVICES [address] [place], [place], [address] Phone: [phone]   Fax:  [phone]  Name: Patrick Mcgee MRN: [id number] Date of Birth: 21-Aug-1945

## 2019-08-12 NOTE — Progress Notes (Signed)
Patient stated that he had been doing well with no complaints. 

## 2019-08-13 ENCOUNTER — Encounter: Payer: Self-pay | Admitting: Family Medicine

## 2019-08-13 ENCOUNTER — Ambulatory Visit (INDEPENDENT_AMBULATORY_CARE_PROVIDER_SITE_OTHER): Payer: Medicare Other | Admitting: Family Medicine

## 2019-08-13 VITALS — BP 94/64 | HR 94 | Temp 96.8°F | Resp 18 | Ht 68.0 in | Wt 157.0 lb

## 2019-08-13 DIAGNOSIS — I10 Essential (primary) hypertension: Secondary | ICD-10-CM | POA: Diagnosis not present

## 2019-08-13 DIAGNOSIS — C642 Malignant neoplasm of left kidney, except renal pelvis: Secondary | ICD-10-CM

## 2019-08-13 DIAGNOSIS — E119 Type 2 diabetes mellitus without complications: Secondary | ICD-10-CM

## 2019-08-13 DIAGNOSIS — M21379 Foot drop, unspecified foot: Secondary | ICD-10-CM

## 2019-08-13 DIAGNOSIS — I25119 Atherosclerotic heart disease of native coronary artery with unspecified angina pectoris: Secondary | ICD-10-CM

## 2019-08-13 DIAGNOSIS — G4733 Obstructive sleep apnea (adult) (pediatric): Secondary | ICD-10-CM | POA: Diagnosis not present

## 2019-08-13 LAB — POCT GLYCOSYLATED HEMOGLOBIN (HGB A1C)
Est. average glucose Bld gHb Est-mCnc: 189
Hemoglobin A1C: 8.2 % — AB (ref 4.0–5.6)

## 2019-08-13 NOTE — Patient Instructions (Addendum)
Decrease losartan 100 mg daily to 50 mg daily.

## 2019-08-14 ENCOUNTER — Other Ambulatory Visit: Payer: Self-pay

## 2019-08-14 ENCOUNTER — Ambulatory Visit: Payer: Medicare Other

## 2019-08-14 DIAGNOSIS — M6281 Muscle weakness (generalized): Secondary | ICD-10-CM | POA: Diagnosis not present

## 2019-08-14 DIAGNOSIS — R2681 Unsteadiness on feet: Secondary | ICD-10-CM

## 2019-08-14 NOTE — Progress Notes (Signed)
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 [name] so far for his metastatic urothelial carcinoma. This was originally diagnosed in May 2019. He was noted to have a filling defect in the lower pole collecting system of the left kidney along with para-aortic and retroperitoneal adenopathy concerning for metastatic disease. He underwent left ureteroscopy and renal pelvis biopsy which revealed small fragments of high-grade urothelial carcinoma with small focus of invasion. He was started on carboplatin and gemcitabine chemotherapy in June 2019 and was continued on 05/27/2018 for 8 cycles. He tolerated chemotherapy well except for chemo-induced anemia for which she has been getting Procrit every 2 weeks. He did have response to his disease based on scans in August 2019. However repeat scan on 05/31/2018 showed increase in the size of the primary tumor from 1.2 to 1.9 cm and increase in left para-aortic adenopathy from 0.9 to 1.3 cm. Periportal adenopathy was stable at 1.4 cm. Second line immunotherapy was recommended. His initial biopsy specimen did not have enough sample to undergo FGFR mutation testing. He also has mild interstitial lung disease for which he sees  pulmonary but he is not on home oxygen. He has B12 deficiency for which he is on oral B12. Also has diabetes and coronary artery disease.Tecentriq started on 06/17/2018.Patient noted to have disease progression in his lymph nodes in May 2020. Repeat biopsy showed metastatic urothelial carcinoma which did not have aFGFR mutation. He has been started on third line Padcev  Interval history-other than fatigue patient denies any other complaints at this time he does have tingling numbness in his hands and feet which is essentially stable.  ECOG PS- 1 Pain scale- 0 Opioid associated constipation- no  Review of systems- Review of Systems  Constitutional: Positive for malaise/fatigue. Negative for chills, fever and weight loss.  HENT: Negative for congestion, ear discharge and nosebleeds.   Eyes: Negative for blurred vision.  Respiratory: Negative for cough, hemoptysis, sputum production, shortness of breath and wheezing.   Cardiovascular: Negative for chest pain, palpitations, orthopnea and claudication.  Gastrointestinal: Negative for abdominal pain, blood in stool, constipation, diarrhea, heartburn, melena, nausea and vomiting.  Genitourinary: Negative for dysuria, flank pain, frequency, hematuria and urgency.  Musculoskeletal: Negative for back pain, joint pain and myalgias.  Skin: Negative for rash.  Neurological: Positive for sensory change (Peripheral neuropathy). Negative for dizziness, tingling, focal weakness, seizures, weakness and headaches.  Endo/Heme/Allergies: Does not bruise/bleed easily.  Psychiatric/Behavioral: Negative for depression and suicidal ideas. The patient does not have insomnia.       Allergies  Allergen Reactions  . Sulfa Antibiotics Other (See Comments)    Joint pain  . Ace Inhibitors Cough  . Invokana [Canagliflozin] Other (See Comments)    Leg pain  . Penicillins Rash    Did it involve swelling of the face/tongue/throat, SOB, or low BP? No Did it  involve sudden or  severe rash/hives, skin peeling, or any reaction on the inside of your mouth or nose? Yes Did you need to seek medical attention at a hospital or doctor's office? Yes When did it last happen?childhood allergy If all above answers are "NO", may proceed with cephalosporin use.      Past Medical History:  Diagnosis Date  . Acid reflux   . Anxiety   . Depression   . Diabetes mellitus without complication ([place])   . Hyperlipidemia   . Hypertension   . MRSA (methicillin resistant Staphylococcus aureus) infection 1992   history  . Myocardial infarction ([place]) 1995  . Sleep apnea    CPAP  . Urothelial cancer ([place]) 11/2017   Left Urothelial mass, chemo tx's     Past Surgical History:  Procedure Laterality Date  . CATARACT EXTRACTION Left   . COLONOSCOPY  2010   [facility]  . COLONOSCOPY WITH PROPOFOL N/A 10/04/2016   Procedure: COLONOSCOPY WITH PROPOFOL;  Surgeon: [name], MD;  Location: [facility] ENDOSCOPY;  Service: Endoscopy;  Laterality: N/A;  . [place], 2000, 2001, 2014  . CYSTOSCOPY W/ RETROGRADES Bilateral 12/10/2017   Procedure: CYSTOSCOPY WITH RETROGRADE PYELOGRAM;  Surgeon: [name], MD;  Location: [facility] ORS;  Service: Urology;  Laterality: Bilateral;  . CYSTOSCOPY W/ RETROGRADES Left 12/09/2018   Procedure: CYSTOSCOPY WITH RETROGRADE PYELOGRAM;  Surgeon: [name], MD;  Location: [facility] ORS;  Service: Urology;  Laterality: Left;  . CYSTOSCOPY WITH BIOPSY Left 12/09/2018   Procedure: CYSTOSCOPY WITH URETERAL/RENAL PELVIC BIOPSY;  Surgeon: [name], MD;  Location: [facility] ORS;  Service: Urology;  Laterality: Left;  . CYSTOSCOPY WITH STENT PLACEMENT Left 12/10/2017   Procedure: CYSTOSCOPY WITH STENT PLACEMENT;  Surgeon: [name], MD;  Location: [facility] ORS;  Service: Urology;  Laterality: Left;  . CYSTOSCOPY WITH STENT PLACEMENT Left 12/09/2018   Procedure: CYSTOSCOPY WITH STENT PLACEMENT;  Surgeon:  [name], MD;  Location: [facility] ORS;  Service: Urology;  Laterality: Left;  . CYSTOSCOPY WITH URETEROSCOPY Left 12/09/2018   Procedure: CYSTOSCOPY WITH URETEROSCOPY;  Surgeon: [name], MD;  Location: [facility] ORS;  Service: Urology;  Laterality: Left;  . EYE SURGERY Bilateral    cataract  . INGUINAL HERNIA REPAIR Right 08/09/2015   Procedure: HERNIA REPAIR INGUINAL ADULT;  Surgeon: [name], MD;  Location: [facility] ORS;  Service: General;  Laterality: Right;  . NASAL SINUS SURGERY    . nuclear stress test    . PORTA CATH INSERTION N/A 12/19/2017   Procedure: PORTA CATH INSERTION;  Surgeon: [name], MD;  Location: [place] CV LAB;  Service: Cardiovascular;  Laterality: N/A;  . URETERAL BIOPSY Left 12/10/2017   Procedure: URETERAL & renal PELVIS BIOPSY;  Surgeon: [name], MD;  Location: [facility] ORS;  Service: Urology;  Laterality: Left;  . URETEROSCOPY Left 12/10/2017   Procedure: URETEROSCOPY;  Surgeon: [name], MD;  Location: [facility] ORS;  Service: Urology;  Laterality: Left;    Social History   Socioeconomic History  . Marital status: Married    Spouse name: [name]  . Number of children: 1  . Years of education: Not on file  . Highest education level: Associate degree: occupational, Hotel manager, or vocational program  Occupational History  . Occupation: retired  Tobacco Use  . Smoking status: Former Smoker    Types: Cigars    Quit date: 10/09/1978    Years since quitting: 40.8  . Smokeless tobacco: Former Systems developer    Types: Loss adjuster, chartered  Quit date: 12/04/1988  . Tobacco comment: on occasion  Substance and Sexual Activity  . Alcohol use: Not Currently  . Drug use: No  . Sexual activity: Not Currently  Other Topics Concern  . Not on file  Social History Narrative  . Not on file   Social Determinants of Health   Financial Resource Strain:   . Difficulty of Paying Living Expenses: Not on file  Food Insecurity:   . Worried About Charity fundraiser in the Last  Year: Not on file  . Ran Out of Food in the Last Year: Not on file  Transportation Needs:   . Lack of Transportation (Medical): Not on file  . Lack of Transportation (Non-Medical): Not on file  Physical Activity: Inactive  . Days of Exercise per Week: 0 days  . Minutes of Exercise per Session: 0 min  Stress: No Stress Concern Present  . Feeling of Stress : Not at all  Social Connections: Unknown  . Frequency of Communication with Friends and Family: Patient refused  . Frequency of Social Gatherings with Friends and Family: Patient refused  . Attends Religious Services: Patient refused  . Active Member of Clubs or Organizations: Patient refused  . Attends Archivist Meetings: Patient refused  . Marital Status: Patient refused  Intimate Partner Violence: Unknown  . Fear of Current or Ex-Partner: Patient refused  . Emotionally Abused: Patient refused  . Physically Abused: Patient refused  . Sexually Abused: Patient refused    Family History  Problem Relation Age of Onset  . Heart disease Mother   . Cancer Father        Lung and colon cancer  . Heart disease Father   . Emphysema Maternal Grandfather   . Tuberculosis Maternal Grandmother      Current Outpatient Medications:  .  aspirin EC 81 MG tablet, Take 81 mg by mouth every evening. , Disp: , Rfl:  .  atorvastatin (LIPITOR) 40 MG tablet, TAKE 1 TABLET BY MOUTH AT BEDTIME, Disp: 90 tablet, Rfl: 4 .  Cholecalciferol (VITAMIN D3) 75 MCG (3000 UT) TABS, Take by mouth., Disp: , Rfl:  .  clobetasol cream (TEMOVATE) [id number] %, Apply 1 application topically as needed (for skin rash)., Disp: 30 g, Rfl: 1 .  fluticasone (FLONASE) 50 MCG/ACT nasal spray, Use 2 spray(s) in each nostril once daily, Disp: 48 g, Rfl: 3 .  gabapentin (NEURONTIN) 300 MG capsule, Take 1 capsule (300 mg total) by mouth 3 (three) times daily., Disp: 270 capsule, Rfl: 0 .  glucose blood (CONTOUR NEXT TEST) test strip, Check blood sugars 3 times daily.,  Disp: 300 each, Rfl: 11 .  HYDROcodone-acetaminophen (NORCO/VICODIN) 5-325 MG tablet, Take 1 tablet by mouth 1 day or 1 dose., Disp: , Rfl:  .  hydrOXYzine (ATARAX/VISTARIL) 25 MG tablet, Take 1 tablet (25 mg total) by mouth every 8 (eight) hours as needed for itching., Disp: 30 tablet, Rfl: 3 .  Ivermectin (SOOLANTRA) 1 % CREA, Apply 1 application topically daily as needed (rosacea). To face, Disp: , Rfl:  .  JARDIANCE 10 MG TABS tablet, Take 1 tablet by mouth once daily, Disp: 30 tablet, Rfl: 11 .  lidocaine-prilocaine (EMLA) cream, Apply 1 application topically as needed (port access)., Disp: 1 g, Rfl: 3 .  loratadine (CLARITIN) 10 MG tablet, Take 10 mg by mouth daily as needed for allergies. [employer] brand allergy relief, Disp: , Rfl:  .  losartan (COZAAR) 100 MG tablet, Take 1 tablet by mouth once daily,  Disp: 90 tablet, Rfl: 3 .  magic mouthwash w/lidocaine SOLN, Take 5 mLs by mouth 4 (four) times daily as needed for mouth pain., Disp: 480 mL, Rfl: 3 .  magnesium hydroxide (MILK OF MAGNESIA) 400 MG/5ML suspension, Take 15 mLs by mouth daily as needed for mild constipation., Disp: , Rfl:  .  metFORMIN (GLUCOPHAGE) 1000 MG tablet, TAKE 1 TABLET BY MOUTH TWICE DAILY WITH MEALS, Disp: 180 tablet, Rfl: 0 .  nystatin cream (MYCOSTATIN), Apply topically 2 (two) times daily., Disp: 15 g, Rfl: 1 .  OLANZapine (ZYPREXA) 10 MG tablet, Take 1 tablet (10 mg total) by mouth at bedtime., Disp: 30 tablet, Rfl: 1 .  OVER THE COUNTER MEDICATION, Apply 1 application topically daily as needed (pain). Outback topical pain relief, Disp: , Rfl:  .  oxybutynin (DITROPAN) 5 MG tablet, Take 1 tablet by mouth 1 day or 1 dose., Disp: , Rfl:  .  sertraline (ZOLOFT) 100 MG tablet, Take 1 tablet (100 mg total) by mouth daily., Disp: 90 tablet, Rfl: 3 .  vitamin B-12 (CYANOCOBALAMIN) 1000 MCG tablet, Take by mouth daily. Unsure dose, Disp: , Rfl:  .  vitamin C (ASCORBIC ACID) 500 MG tablet, Take 1,000-1,500 mg by mouth daily  as needed (immune support)., Disp: , Rfl:  .  VITAMIN D PO, Take 1 tablet by mouth 1 day or 1 dose., Disp: , Rfl:  No current facility-administered medications for this visit.  Facility-Administered Medications Ordered in Other Visits:  .  Influenza vac split quadrivalent PF (FLUZONE HIGH-DOSE) injection 0.5 mL, 0.5 mL, Intramuscular, Once, Karen Kitchens, NP  Physical exam:  Vitals:   08/12/19 0859  BP: 121/78  Pulse: 81  Temp: (!) 95.7 F (35.4 C)  TempSrc: Tympanic  Weight: 152 lb 14.4 oz (69.4 kg)   Physical Exam HENT:     Head: Normocephalic and atraumatic.  Eyes:     Pupils: Pupils are equal, round, and reactive to light.  Cardiovascular:     Rate and Rhythm: Normal rate and regular rhythm.     Heart sounds: Normal heart sounds.  Pulmonary:     Effort: Pulmonary effort is normal.     Breath sounds: Normal breath sounds.  Abdominal:     General: Bowel sounds are normal.     Palpations: Abdomen is soft.  Musculoskeletal:     Cervical back: Normal range of motion.  Skin:    General: Skin is warm and dry.  Neurological:     Mental Status: He is alert and oriented to person, place, and time.      CMP Latest Ref Rng & Units 08/12/2019  Glucose 70 - 99 mg/dL 160(H)  BUN 8 - 23 mg/dL 23  Creatinine 0.61 - 1.24 mg/dL 0.85  Sodium 135 - 145 mmol/L 137  Potassium 3.5 - 5.1 mmol/L 4.0  Chloride 98 - 111 mmol/L 101  CO2 22 - 32 mmol/L 25  Calcium 8.9 - 10.3 mg/dL 9.8  Total Protein 6.5 - 8.1 g/dL 7.1  Total Bilirubin 0.3 - 1.2 mg/dL 0.7  Alkaline Phos 38 - 126 U/L 84  AST 15 - 41 U/L 17  ALT 0 - 44 U/L 14   CBC Latest Ref Rng & Units 08/12/2019  WBC 4.0 - 10.5 K/uL 5.4  Hemoglobin 13.0 - 17.0 g/dL 12.4(L)  Hematocrit 39.0 - 52.0 % 39.0  Platelets 150 - 400 K/uL 225     Assessment and plan- Patient is a 74 y.o. male with history of metastatic upper urothelial  carcinoma with metastases to the lymph nodes.  He is here for on treatment assessment prior to next cycle of  Padcev  Patient's last scan in December 2020 showed hardly any evidence of cancer other than some hypoenhancement in the left kidney.  Patient wishes to continue Padcev but at a much reduced schedule given his ongoing neuropathy.  He will receive his next cycle of Padcev today and we will proceed with a 1 week on 3-week off schedule at this time to maintain his quality of life and prevent worsening of his neuropathy.  I will see him back in 4 weeks with CBC with differential, CMP for next cycle of Padcev  Chemo-induced peripheral neuropathy: Continue gabapentin.   Visit Diagnosis 1. Urothelial carcinoma of kidney, left ([place])   2. Encounter for antineoplastic chemotherapy   3. Chemotherapy-induced peripheral neuropathy ([facility])      Dr. Randa Evens, MD, MPH [facility] [id number] 08/14/2019 1:39 PM

## 2019-08-14 NOTE — Therapy (Signed)
[place] MAIN [facility] SERVICES [address] [place], [place], [address] Phone: [phone]   Fax:  [phone]  Physical Therapy Treatment  Patient Details  Name: Patrick Mcgee MRN: [id number] Date of Birth: January 27, 1946 Referring Provider (PT): Dr. Rosanna Randy   Encounter Date: 08/14/2019  PT End of Session - 08/14/19 0935    Visit Number  17    Number of Visits  25    Date for PT Re-Evaluation  08/25/19    PT Start Time  0933    PT Stop Time  1015    PT Time Calculation (min)  42 min    Equipment Utilized During Treatment  Gait belt    Activity Tolerance  Patient tolerated treatment well    Behavior During Therapy  [facility] for tasks assessed/performed       Past Medical History:  Diagnosis Date  . Acid reflux   . Anxiety   . Depression   . Diabetes mellitus without complication ([place])   . Hyperlipidemia   . Hypertension   . MRSA (methicillin resistant Staphylococcus aureus) infection 1992   history  . Myocardial infarction ([place]) 1995  . Sleep apnea    CPAP  . Urothelial cancer ([place]) 11/2017   Left Urothelial mass, chemo tx's    Past Surgical History:  Procedure Laterality Date  . CATARACT EXTRACTION Left   . COLONOSCOPY  2010   [facility]  . COLONOSCOPY WITH PROPOFOL N/A 10/04/2016   Procedure: COLONOSCOPY WITH PROPOFOL;  Surgeon: [name], MD;  Location: [facility] ENDOSCOPY;  Service: Endoscopy;  Laterality: N/A;  . [place], 2000, 2001, 2014  . CYSTOSCOPY W/ RETROGRADES Bilateral 12/10/2017   Procedure: CYSTOSCOPY WITH RETROGRADE PYELOGRAM;  Surgeon: [name], MD;  Location: [facility] ORS;  Service: Urology;  Laterality: Bilateral;  . CYSTOSCOPY W/ RETROGRADES Left 12/09/2018   Procedure: CYSTOSCOPY WITH RETROGRADE PYELOGRAM;  Surgeon: [name], MD;  Location: [facility] ORS;  Service: Urology;  Laterality: Left;  . CYSTOSCOPY WITH BIOPSY Left 12/09/2018   Procedure: CYSTOSCOPY WITH  URETERAL/RENAL PELVIC BIOPSY;  Surgeon: [name], MD;  Location: [facility] ORS;  Service: Urology;  Laterality: Left;  . CYSTOSCOPY WITH STENT PLACEMENT Left 12/10/2017   Procedure: CYSTOSCOPY WITH STENT PLACEMENT;  Surgeon: [name], MD;  Location: [facility] ORS;  Service: Urology;  Laterality: Left;  . CYSTOSCOPY WITH STENT PLACEMENT Left 12/09/2018   Procedure: CYSTOSCOPY WITH STENT PLACEMENT;  Surgeon: [name], MD;  Location: [facility] ORS;  Service: Urology;  Laterality: Left;  . CYSTOSCOPY WITH URETEROSCOPY Left 12/09/2018   Procedure: CYSTOSCOPY WITH URETEROSCOPY;  Surgeon: [name], MD;  Location: [facility] ORS;  Service: Urology;  Laterality: Left;  . EYE SURGERY Bilateral    cataract  . INGUINAL HERNIA REPAIR Right 08/09/2015   Procedure: HERNIA REPAIR INGUINAL ADULT;  Surgeon: [name], MD;  Location: [facility] ORS;  Service: General;  Laterality: Right;  . NASAL SINUS SURGERY    . nuclear stress test    . PORTA CATH INSERTION N/A 12/19/2017   Procedure: PORTA CATH INSERTION;  Surgeon: [name], MD;  Location: [facility] LAB;  Service: Cardiovascular;  Laterality: N/A;  . URETERAL BIOPSY Left 12/10/2017   Procedure: URETERAL & renal PELVIS BIOPSY;  Surgeon: [name], MD;  Location: [facility] ORS;  Service: Urology;  Laterality: Left;  . URETEROSCOPY Left 12/10/2017   Procedure: URETEROSCOPY;  Surgeon: [name], MD;  Location: [facility] ORS;  Service: Urology;  Laterality: Left;  There were no vitals filed for this visit.  Subjective Assessment - 08/14/19 0934    Subjective  Pt denies any pain today. No specific questions or concerns upon arrival. No falls or stumbles since last therapy session.    Pertinent History  Part of history borrowed from oncology note and verified with patient: Patient is a 74 year old male who was referred for foot drop. He started with Dr. [name] so far for his metastatic urothelial carcinoma.  This was originally diagnosed in May  2019. He was started on chemotherapy in June 2019 and was continued on 05/27/2018 for 8 cycles.  He tolerated chemotherapy well except for chemo-induced anemia for which she has been getting Procrit every 2 weeks.  He did have response to his disease based on scans in August 2019.  However repeat scan on 05/31/2018 showed increase in the size of the primary tumor from 1.2 to 1.9 cm and increase in left para-aortic adenopathy from 0.9 to 1.3 cm.  Second line immunotherapy was recommended.  His initial biopsy specimen did not have enough sample to undergo FGFR mutation testing. He also has mild interstitial lung disease for which he sees pulmonary but he is not on home oxygen.  He has B12 deficiency for which he is on oral B12.  Also has diabetes and coronary artery disease. Patient noted to have disease progression in his lymph nodes in May 2020.  Repeat biopsy showed metastatic urothelial carcinoma which did not have a FGFR mutation.  He has been started on third line Padcev. There is still times when he feels fatigued but he is having more good days. He reports poor appetite. He was working with physical therapy on his foot drop, RLE weakness, and unsteadiness. He states that he gets considerable numbness for the 7-8 days following his chemotherapy treatment. He states that he is a diabetic and did have some numbness prior to starting chemotherapy. He states that he has a history of a low back disc bulge with RLE radicular symptoms. He used to get spinal injections but reports he hasn't had any injections in the last 10 years.    Limitations  Walking    Currently in Pain?  No/denies         TREATMENT   Ther-ex Sit to stand from regular height chair without UE support 2 x 10 with CGA only; Precor BLE leg press 70# x 20, 85# x 20 Precor BLE heel raise 55# x 20; Standing heel raises without UE support x 20; Standing hip flexion march, hip extension, hip abduction, and HS curls with 4# ankle weights  x 20 each; Seated LAQ with 4# ankle weights x 20 each; Seated clams with green tband, 3s hold x 20; Seated adductor ball squeeze, 3s hold x 20; Forward lunges alternating leading LE x 10 each; Seated ankle dorsiflexion with light manual resistance from therapist on R side x 10; 5" forward step-ups without UE support alternating leading LE x 10 each; 5" lateral step-ups without UE support x 10 each direction; Single leg balance practice x 30s on each LE;   Pt educated throughout session about proper posture and technique with exercises. Improved exercise technique, movement at target joints, use of target muscles after min to mod verbal, visual, tactile cues.   Pt demonstrates excellent motivation during session today and is able to progress his resistance on the leg press again today. He is demonstrating improved LE endurance with sit to stand exercises. Session today focused again on strengthening today.  He continues to demonstrate impairment in single leg stance balance. Pt encouraged to continue HEP and follow-up as scheduled. Hewould benefit from further skilled PT intervention to maximize safety, mobility, and independence.                        PT Short Term Goals - 07/07/19 1026      PT SHORT TERM GOAL #1   Title  Pt will be independent with HEP in order to improve strength and balance in order to decrease fall risk and improve function at home    Time  6    Period  Weeks    Status  On-going    Target Date  07/14/19        PT Long Term Goals - 07/09/19 1236      PT LONG TERM GOAL #1   Title  Pt will improve BERG by at least 3 points in order to demonstrate clinically significant improvement in balance.    Baseline  06/02/19: 48/56; 07/08/19: 54/56    Time  12    Period  Weeks    Status  Achieved      PT LONG TERM GOAL #2   Title  Pt will improve ABC by at least 13% in order to demonstrate clinically significant improvement in balance confidence.     Baseline  06/02/19: 64.4%; 07/07/19: 65%    Time  12    Period  Weeks    Status  On-going    Target Date  08/25/19      PT LONG TERM GOAL #3   Title  Pt will increase 30s Sit to Stand Test to greater than or equal to 14 repetitions in order to demonstrate clinically significant improvement in LE endurance and decrease in fall risk.    Baseline  06/02/19: 10 repetitions; 07/07/19: 13 repetitions    Time  12    Period  Weeks    Status  Partially Met    Target Date  08/25/19      PT LONG TERM GOAL #4   Title  Pt will increase 6MWT by at least 170m(3226f in order to demonstrate clinically significant improvement in cardiopulmonary endurance and community ambulation    Baseline  06/10/20: 137531fno AD, no LOB, Rt foot slap without flat foot strike; 07/07/19: test terminated 2/2 fall; 07/09/19: 1565' with R AFO, no assistive device, mask donned;    Time  12    Period  Weeks    Status  Revised    Target Date  08/25/19            Plan - 08/14/19 0941    Clinical Impression Statement  Pt demonstrates excellent motivation during session today and is able to progress his resistance on the leg press again today. He is demonstrating improved LE endurance with sit to stand exercises. Session today focused again on strengthening today. He continues to demonstrate impairment in single leg stance balance. Pt encouraged to continue HEP and follow-up as scheduled. He would benefit from further skilled PT intervention to maximize safety, mobility, and independence.    Personal Factors and Comorbidities  Age;Comorbidity 3+;Time since onset of injury/illness/exacerbation    Comorbidities  Urothelial cancer, DM, previous lumbar disc bulge, interstitial pulmonary disease    Examination-Activity Limitations  Locomotion Level;Stairs;Transfers    Examination-Participation Restrictions  Community Activity;Laundry;Shop    Rehab Potential  Fair    PT Frequency  2x / week    PT Duration  12 weeks    PT  Treatment/Interventions  ADLs/Self Care Home Management;Aquatic Therapy;Biofeedback;Canalith Repostioning;Cryotherapy;Iontophoresis 19m/ml Dexamethasone;Moist Heat;Traction;Ultrasound;DME Instruction;Gait training;Stair training;Functional mobility training;Therapeutic activities;Therapeutic exercise;Balance training;Neuromuscular re-education;Patient/family education;Orthotic Fit/Training;Manual techniques;Dry needling;Energy conservation;Vestibular;Joint Manipulations    PT Next Visit Plan  Continue with strengthening and balance    PT Home Exercise Plan  Access Code: [email or web address]   Consulted and Agree with Plan of Care  Patient       Patient will benefit from skilled therapeutic intervention in order to improve the following deficits and impairments:  Abnormal gait, Decreased activity tolerance, Decreased balance, Decreased strength  Visit Diagnosis: Unsteadiness on feet  Muscle weakness (generalized)     Problem List Patient Active Problem List   Diagnosis Date Noted  . Chest pain 03/04/2019  . Anemia due to antineoplastic chemotherapy 03/03/2018  . Cough 03/03/2018  . Chemotherapy induced neutropenia ([place] 01/22/2018  . B12 deficiency 01/01/2018  . Hypomagnesemia 12/21/2017  . Encounter for antineoplastic chemotherapy 12/21/2017  . Malignant neoplasm of kidney ([place]   . U[place]thelial cancer ([place] 12/18/2017  . Malnutrition of moderate degree 12/18/2017  . Therapeutic opioid induced constipation   . Palliative care encounter   . Dehydration 12/17/2017  . Goals of care, counseling/discussion 12/13/2017  . Urothelial carcinoma of kidney, left ([place] 12/07/2017  . Right inguinal hernia 07/17/2015  . Family history of colon cancer 07/17/2015  . Allergic rhinitis 11/12/2014  . Airway hyperreactivity 11/12/2014  . Atherosclerosis of coronary artery 11/12/2014  . Cheilitis 11/12/2014  . Narrowing of intervertebral disc space 11/12/2014  . Deflected nasal septum 11/12/2014  . HLD  (hyperlipidemia) 11/12/2014  . BP (high blood pressure) 11/12/2014  . Adult hypothyroidism 11/12/2014  . Sleep apnea 11/12/2014  . Diabetes mellitus, type 2 ([place] 11/12/2014  . Degenerative disc disease, lumbar 09/24/2012  . Furunculosis 04/23/2012   Patrick GroutPT, DPT, GCS  Patrick Mcgee 08/14/2019, 10:30 AM  [place] [facility] CenterSERV[facility] [address] [address]Phone: [phone]  Fax:  [phone] Name: Patrick CLAYSONMRN: [id number] of Birth: [birth year]

## 2019-08-18 ENCOUNTER — Telehealth: Payer: Self-pay | Admitting: Family Medicine

## 2019-08-18 ENCOUNTER — Ambulatory Visit: Payer: Medicare Other | Admitting: Physical Therapy

## 2019-08-18 ENCOUNTER — Other Ambulatory Visit: Payer: Self-pay

## 2019-08-18 ENCOUNTER — Encounter: Payer: Self-pay | Admitting: Physical Therapy

## 2019-08-18 DIAGNOSIS — M6281 Muscle weakness (generalized): Secondary | ICD-10-CM

## 2019-08-18 DIAGNOSIS — R2681 Unsteadiness on feet: Secondary | ICD-10-CM

## 2019-08-18 MED ORDER — LOSARTAN POTASSIUM 50 MG PO TABS: 50.0000 mg | ORAL_TABLET | Freq: Every day | ORAL | 3 refills | Status: DC

## 2019-08-18 NOTE — Telephone Encounter (Signed)
losartan (COZAAR) 100 MG tablet    Patient states he was advised by Dr. Rosanna Randy to half this mediation. Patient states that due to the shape of this tablet, he is unable to cut in half. Patient requesting call back to discuss options.

## 2019-08-18 NOTE — Telephone Encounter (Signed)
Returned call to pt. Sent losartan 50 mg qd to pharmacy.

## 2019-08-18 NOTE — Therapy (Signed)
[place] MAIN [facility] SERVICES [address] [place], [place], [address] Phone: [phone]   Fax:  [phone]  Physical Therapy Treatment  Patient Details  Name: Patrick Mcgee MRN: [id number] Date of Birth: 1945-12-09 Referring Provider (PT): Patrick Mcgee   Encounter Date: 08/18/2019  PT End of Session - 08/18/19 1021    Visit Number  18    Number of Visits  25    Date for PT Re-Evaluation  08/25/19    PT Start Time  1017    PT Stop Time  1100    PT Time Calculation (min)  43 min    Equipment Utilized During Treatment  Gait belt    Activity Tolerance  Patient tolerated treatment well    Behavior During Therapy  [facility] for tasks assessed/performed       Past Medical History:  Diagnosis Date  . Acid reflux   . Anxiety   . Depression   . Diabetes mellitus without complication ([place])   . Hyperlipidemia   . Hypertension   . MRSA (methicillin resistant Staphylococcus aureus) infection 1992   history  . Myocardial infarction ([place]) 1995  . Sleep apnea    CPAP  . Urothelial cancer ([place]) 11/2017   Left Urothelial mass, chemo tx's    Past Surgical History:  Procedure Laterality Date  . CATARACT EXTRACTION Left   . COLONOSCOPY  2010   [facility]  . COLONOSCOPY WITH PROPOFOL N/A 10/04/2016   Procedure: COLONOSCOPY WITH PROPOFOL;  Surgeon: [name], MD;  Location: [facility] ENDOSCOPY;  Service: Endoscopy;  Laterality: N/A;  . [place], 2000, 2001, 2014  . CYSTOSCOPY W/ RETROGRADES Bilateral 12/10/2017   Procedure: CYSTOSCOPY WITH RETROGRADE PYELOGRAM;  Surgeon: [name], MD;  Location: [facility] ORS;  Service: Urology;  Laterality: Bilateral;  . CYSTOSCOPY W/ RETROGRADES Left 12/09/2018   Procedure: CYSTOSCOPY WITH RETROGRADE PYELOGRAM;  Surgeon: [name], MD;  Location: [facility] ORS;  Service: Urology;  Laterality: Left;  . CYSTOSCOPY WITH BIOPSY Left 12/09/2018   Procedure: CYSTOSCOPY WITH  URETERAL/RENAL PELVIC BIOPSY;  Surgeon: [name], MD;  Location: [facility] ORS;  Service: Urology;  Laterality: Left;  . CYSTOSCOPY WITH STENT PLACEMENT Left 12/10/2017   Procedure: CYSTOSCOPY WITH STENT PLACEMENT;  Surgeon: [name], MD;  Location: [facility] ORS;  Service: Urology;  Laterality: Left;  . CYSTOSCOPY WITH STENT PLACEMENT Left 12/09/2018   Procedure: CYSTOSCOPY WITH STENT PLACEMENT;  Surgeon: [name], MD;  Location: [facility] ORS;  Service: Urology;  Laterality: Left;  . CYSTOSCOPY WITH URETEROSCOPY Left 12/09/2018   Procedure: CYSTOSCOPY WITH URETEROSCOPY;  Surgeon: [name], MD;  Location: [facility] ORS;  Service: Urology;  Laterality: Left;  . EYE SURGERY Bilateral    cataract  . INGUINAL HERNIA REPAIR Right 08/09/2015   Procedure: HERNIA REPAIR INGUINAL ADULT;  Surgeon: [name], MD;  Location: [facility] ORS;  Service: General;  Laterality: Right;  . NASAL SINUS SURGERY    . nuclear stress test    . PORTA CATH INSERTION N/A 12/19/2017   Procedure: PORTA CATH INSERTION;  Surgeon: [name], MD;  Location: [facility] CV LAB;  Service: Cardiovascular;  Laterality: N/A;  . URETERAL BIOPSY Left 12/10/2017   Procedure: URETERAL & renal PELVIS BIOPSY;  Surgeon: [name], MD;  Location: [facility] ORS;  Service: Urology;  Laterality: Left;  . URETEROSCOPY Left 12/10/2017   Procedure: URETEROSCOPY;  Surgeon: [name], MD;  Location: [facility] ORS;  Service: Urology;  Laterality: Left;  There were no vitals filed for this visit.  Subjective Assessment - 08/18/19 1020    Subjective  Pt denies any pain today. No specific questions or concerns upon arrival. No falls or stumbles since last therapy session.He did not get around to doing his exercses over the weekend.    Pertinent History  Part of history borrowed from oncology note and verified with patient: Patient is a 74 year old male who was referred for foot drop. He started with Patrick Mcgee so far for his metastatic  urothelial carcinoma.  This was originally diagnosed in May 2019. He was started on chemotherapy in June 2019 and was continued on 05/27/2018 for 8 cycles.  He tolerated chemotherapy well except for chemo-induced anemia for which she has been getting Procrit every 2 weeks.  He did have response to his disease based on scans in August 2019.  However repeat scan on 05/31/2018 showed increase in the size of the primary tumor from 1.2 to 1.9 cm and increase in left para-aortic adenopathy from 0.9 to 1.3 cm.  Second line immunotherapy was recommended.  His initial biopsy specimen did not have enough sample to undergo FGFR mutation testing. He also has mild interstitial lung disease for which he sees pulmonary but he is not on home oxygen.  He has B12 deficiency for which he is on oral B12.  Also has diabetes and coronary artery disease. Patient noted to have disease progression in his lymph nodes in May 2020.  Repeat biopsy showed metastatic urothelial carcinoma which did not have a FGFR mutation.  He has been started on third line Padcev. There is still times when he feels fatigued but he is having more good days. He reports poor appetite. He was working with physical therapy on his foot drop, RLE weakness, and unsteadiness. He states that he gets considerable numbness for the 7-8 days following his chemotherapy treatment. He states that he is a diabetic and did have some numbness prior to starting chemotherapy. He states that he has a history of a low back disc bulge with RLE radicular symptoms. He used to get spinal injections but reports he hasn't had any injections in the last 10 years.    Limitations  Walking    Currently in Pain?  No/denies    Pain Score  0-No pain       Neuromuscular Re-education  Octane fitness x 5 mins L4 Purple foam and step with trunk rotation, side to side x 20 each direction Tandem gait on 2"x4" with UE support x 2 lengths Side stepping on 2"x4" with UE support x 2  lengths Heel/toe raises without UE support 3s hold x 10 each Purple pad and lunge to BOSU ball,  alternating forward LE x 10  TM walking 1.2 miles/ hr x 5 min TM side stepping . 4 miles / hour x 3 mins, has difficulty with motor control      Pt educated throughout session about proper posture and technique with exercises. Improved exercise technique, movement at target joints, use of target muscles after min to mod verbal, visual, tactile cues. CGA and Min to mod verbal cues used throughout with increased in postural sway and LOB most seen with narrow base of support and while on uneven surfaces. Continues to have balance deficits typical with diagnosis. Patient performs intermediate level exercises without pain behaviors and needs verbal cuing for postural alignment and head positioning Tactile cues and assistance needed to keep lower leg and knee in neutral to avoid compensations with  ankle motions.                        PT Education - 08/18/19 1020    Education Details  HEP    Person(s) Educated  Patient    Methods  Explanation;Demonstration    Comprehension  Verbalized understanding;Verbal cues required;Need further instruction       PT Short Term Goals - 07/07/19 1026      PT SHORT TERM GOAL #1   Title  Pt will be independent with HEP in order to improve strength and balance in order to decrease fall risk and improve function at home    Time  6    Period  Weeks    Status  On-going    Target Date  07/14/19        PT Long Term Goals - 07/09/19 1236      PT LONG TERM GOAL #1   Title  Pt will improve BERG by at least 3 points in order to demonstrate clinically significant improvement in balance.    Baseline  06/02/19: 48/56; 07/08/19: 54/56    Time  12    Period  Weeks    Status  Achieved      PT LONG TERM GOAL #2   Title  Pt will improve ABC by at least 13% in order to demonstrate clinically significant improvement in balance confidence.    Baseline   06/02/19: 64.4%; 07/07/19: 65%    Time  12    Period  Weeks    Status  On-going    Target Date  08/25/19      PT LONG TERM GOAL #3   Title  Pt will increase 30s Sit to Stand Test to greater than or equal to 14 repetitions in order to demonstrate clinically significant improvement in LE endurance and decrease in fall risk.    Baseline  06/02/19: 10 repetitions; 07/07/19: 13 repetitions    Time  12    Period  Weeks    Status  Partially Met    Target Date  08/25/19      PT LONG TERM GOAL #4   Title  Pt will increase 6MWT by at least 175m(3280f in order to demonstrate clinically significant improvement in cardiopulmonary endurance and community ambulation    Baseline  06/10/20: 137541fno AD, no LOB, Rt foot slap without flat foot strike; 07/07/19: test terminated 2/2 fall; 07/09/19: 1565' with R AFO, no assistive device, mask donned;    Time  12    Period  Weeks    Status  Revised    Target Date  08/25/19            Plan - 08/18/19 1021    Clinical Impression Statement  Patient instructed in advanced LE strengthening and intermediate balance exercise. Patient required min VCS to improve weight shift and to increase terminal knee extension for better stance control. Patient would benefit from additional skilled PT intervention to improve strength, balance and gait safety.   Personal Factors and Comorbidities  Age;Comorbidity 3+;Time since onset of injury/illness/exacerbation    Comorbidities  Urothelial cancer, DM, previous lumbar disc bulge, interstitial pulmonary disease    Examination-Activity Limitations  Locomotion Level;Stairs;Transfers    Examination-Participation Restrictions  Community Activity;Laundry;Shop    Rehab Potential  Fair    PT Frequency  2x / week    PT Duration  12 weeks    PT Treatment/Interventions  ADLs/Self Care Home Management;Aquatic Therapy;Biofeedback;Canalith Repostioning;Cryotherapy;Iontophoresis '4mg'$ /ml Dexamethasone;Moist  Heat;Traction;Ultrasound;DME  Instruction;Gait training;Stair training;Functional mobility training;Therapeutic activities;Therapeutic exercise;Balance training;Neuromuscular re-education;Patient/family education;Orthotic Fit/Training;Manual techniques;Dry needling;Energy conservation;Vestibular;Joint Manipulations    PT Next Visit Plan  Continue with strengthening and balance    PT Home Exercise Plan  Access Code: [email or web address]    Consulted and Agree with Plan of Care  Patient       Patient will benefit from skilled therapeutic intervention in order to improve the following deficits and impairments:  Abnormal gait, Decreased activity tolerance, Decreased balance, Decreased strength  Visit Diagnosis: Muscle weakness (generalized)  Unsteadiness on feet     Problem List Patient Active Problem List   Diagnosis Date Noted  . Chest pain 03/04/2019  . Anemia due to antineoplastic chemotherapy 03/03/2018  . Cough 03/03/2018  . Chemotherapy induced neutropenia ([place]) 01/22/2018  . B12 deficiency 01/01/2018  . Hypomagnesemia 12/21/2017  . Encounter for antineoplastic chemotherapy 12/21/2017  . Malignant neoplasm of kidney ([place])   . Urothelial cancer ([place]) 12/18/2017  . Malnutrition of moderate degree 12/18/2017  . Therapeutic opioid induced constipation   . Palliative care encounter   . Dehydration 12/17/2017  . Goals of care, counseling/discussion 12/13/2017  . Urothelial carcinoma of kidney, left ([place]) 12/07/2017  . Right inguinal hernia 07/17/2015  . Family history of colon cancer 07/17/2015  . Allergic rhinitis 11/12/2014  . Airway hyperreactivity 11/12/2014  . Atherosclerosis of coronary artery 11/12/2014  . Cheilitis 11/12/2014  . Narrowing of intervertebral disc space 11/12/2014  . Deflected nasal septum 11/12/2014  . HLD (hyperlipidemia) 11/12/2014  . BP (high blood pressure) 11/12/2014  . Adult hypothyroidism 11/12/2014  . Sleep apnea 11/12/2014  . Diabetes mellitus, type 2 ([place]) 11/12/2014  .  Degenerative disc disease, lumbar 09/24/2012  . Furunculosis 04/23/2012    Alanson Puls, PT DPT 08/18/2019, 10:22 AM  [place] MAIN [facility] SERVICES [address] [place], [place], [address] Phone: [phone]   Fax:  [phone]  Name: Patrick Mcgee MRN: [id number] Date of Birth: 1945-08-13

## 2019-08-20 ENCOUNTER — Ambulatory Visit: Payer: Medicare Other

## 2019-08-20 ENCOUNTER — Other Ambulatory Visit: Payer: Self-pay

## 2019-08-20 DIAGNOSIS — M6281 Muscle weakness (generalized): Secondary | ICD-10-CM | POA: Diagnosis not present

## 2019-08-20 DIAGNOSIS — R2681 Unsteadiness on feet: Secondary | ICD-10-CM

## 2019-08-20 NOTE — Therapy (Signed)
[place] MAIN [facility] SERVICES [address] [place], [place], [address] Phone: [phone]   Fax:  [phone]  Physical Therapy Treatment  Patient Details  Name: Patrick Mcgee MRN: [id number] Date of Birth: Jul 26, 1945 Referring Provider (PT): Dr. Rosanna Randy   Encounter Date: 08/20/2019  PT End of Session - 08/20/19 0927    Visit Number  19    Number of Visits  25    Date for PT Re-Evaluation  08/25/19    PT Start Time  0930    PT Stop Time  1013    PT Time Calculation (min)  43 min    Equipment Utilized During Treatment  Gait belt    Activity Tolerance  Patient tolerated treatment well    Behavior During Therapy  [facility] for tasks assessed/performed       Past Medical History:  Diagnosis Date  . Acid reflux   . Anxiety   . Depression   . Diabetes mellitus without complication ([place])   . Hyperlipidemia   . Hypertension   . MRSA (methicillin resistant Staphylococcus aureus) infection 1992   history  . Myocardial infarction ([place]) 1995  . Sleep apnea    CPAP  . Urothelial cancer ([place]) 11/2017   Left Urothelial mass, chemo tx's    Past Surgical History:  Procedure Laterality Date  . CATARACT EXTRACTION Left   . COLONOSCOPY  2010   [facility]  . COLONOSCOPY WITH PROPOFOL N/A 10/04/2016   Procedure: COLONOSCOPY WITH PROPOFOL;  Surgeon: [name], MD;  Location: [facility] ENDOSCOPY;  Service: Endoscopy;  Laterality: N/A;  . [place], 2000, 2001, 2014  . CYSTOSCOPY W/ RETROGRADES Bilateral 12/10/2017   Procedure: CYSTOSCOPY WITH RETROGRADE PYELOGRAM;  Surgeon: [name], MD;  Location: [facility] ORS;  Service: Urology;  Laterality: Bilateral;  . CYSTOSCOPY W/ RETROGRADES Left 12/09/2018   Procedure: CYSTOSCOPY WITH RETROGRADE PYELOGRAM;  Surgeon: [name], MD;  Location: [facility] ORS;  Service: Urology;  Laterality: Left;  . CYSTOSCOPY WITH BIOPSY Left 12/09/2018   Procedure: CYSTOSCOPY WITH  URETERAL/RENAL PELVIC BIOPSY;  Surgeon: [name], MD;  Location: [facility] ORS;  Service: Urology;  Laterality: Left;  . CYSTOSCOPY WITH STENT PLACEMENT Left 12/10/2017   Procedure: CYSTOSCOPY WITH STENT PLACEMENT;  Surgeon: [name], MD;  Location: [facility] ORS;  Service: Urology;  Laterality: Left;  . CYSTOSCOPY WITH STENT PLACEMENT Left 12/09/2018   Procedure: CYSTOSCOPY WITH STENT PLACEMENT;  Surgeon: [name], MD;  Location: [facility] ORS;  Service: Urology;  Laterality: Left;  . CYSTOSCOPY WITH URETEROSCOPY Left 12/09/2018   Procedure: CYSTOSCOPY WITH URETEROSCOPY;  Surgeon: [name], MD;  Location: [facility] ORS;  Service: Urology;  Laterality: Left;  . EYE SURGERY Bilateral    cataract  . INGUINAL HERNIA REPAIR Right 08/09/2015   Procedure: HERNIA REPAIR INGUINAL ADULT;  Surgeon: [name], MD;  Location: [facility] ORS;  Service: General;  Laterality: Right;  . NASAL SINUS SURGERY    . nuclear stress test    . PORTA CATH INSERTION N/A 12/19/2017   Procedure: PORTA CATH INSERTION;  Surgeon: [name], MD;  Location: [place] CV LAB;  Service: Cardiovascular;  Laterality: N/A;  . URETERAL BIOPSY Left 12/10/2017   Procedure: URETERAL & renal PELVIS BIOPSY;  Surgeon: [name], MD;  Location: [facility] ORS;  Service: Urology;  Laterality: Left;  . URETEROSCOPY Left 12/10/2017   Procedure: URETEROSCOPY;  Surgeon: [name], MD;  Location: [facility] ORS;  Service: Urology;  Laterality: Left;  There were no vitals filed for this visit.  Subjective Assessment - 08/20/19 0927    Subjective  Pt denies any pain today. No specific questions or concerns upon arrival. No falls or stumbles since last therapy session. He is feeling slightly more fatigued today. Enjoyed working on his balance during last session.    Pertinent History  Part of history borrowed from oncology note and verified with patient: Patient is a 74 year old male who was referred for foot drop. He started with Dr.  [name] so far for his metastatic urothelial carcinoma.  This was originally diagnosed in May 2019. He was started on chemotherapy in June 2019 and was continued on 05/27/2018 for 8 cycles.  He tolerated chemotherapy well except for chemo-induced anemia for which she has been getting Procrit every 2 weeks.  He did have response to his disease based on scans in August 2019.  However repeat scan on 05/31/2018 showed increase in the size of the primary tumor from 1.2 to 1.9 cm and increase in left para-aortic adenopathy from 0.9 to 1.3 cm.  Second line immunotherapy was recommended.  His initial biopsy specimen did not have enough sample to undergo FGFR mutation testing. He also has mild interstitial lung disease for which he sees pulmonary but he is not on home oxygen.  He has B12 deficiency for which he is on oral B12.  Also has diabetes and coronary artery disease. Patient noted to have disease progression in his lymph nodes in May 2020.  Repeat biopsy showed metastatic urothelial carcinoma which did not have a FGFR mutation.  He has been started on third line Padcev. There is still times when he feels fatigued but he is having more good days. He reports poor appetite. He was working with physical therapy on his foot drop, RLE weakness, and unsteadiness. He states that he gets considerable numbness for the 7-8 days following his chemotherapy treatment. He states that he is a diabetic and did have some numbness prior to starting chemotherapy. He states that he has a history of a low back disc bulge with RLE radicular symptoms. He used to get spinal injections but reports he hasn't had any injections in the last 10 years.    Limitations  Walking    Currently in Pain?  No/denies           TREATMENT   Ther-ex NuStep L2-4 for warm-up during history with therapist adjusting resistance x 5 minutes (3 minutes unbilled); Precor BLE leg press 75# 2 x 20 Precor BLE heel raise55# x20; Green tband resisted  side stepping in // bars x 6 lengths; Standing hip flexion march, hip extension, hip abduction, and HS curls with 5# ankle weights x 20 each; Seated LAQ with 5# ankle weights x 20 each; Sit to stand from regular height chair without UE support 2x15 with CGA only;   Neuromuscular Re-education  1/2 foam roll tandem gait without UE support alteranting forward LE 2 x 30s with each foot forward; 1/2 foam roll heel/toe rocking without UE support x 10 each direction; Tandem gaiton 2"x4"with UE support x4lengths Side stepping on 2"x4" with UE support x 4 lengths Heel/toe raises without UE support 3s hold x 10 each   Pt educated throughout session about proper posture and technique with exercises. Improved exercise technique, movement at target joints, use of target muscles after min to mod verbal, visual, tactile cues.   Ptdemonstrates excellent motivation during session todayhowever reports feeling slightly more fatigued today. Decreased leg press resistance to  match patient's fatigue. Despite fatigue he is able to complete all exercises as instructed. He demonstrates slightly improved tandem gait on 2x4  PT will need updated outcome measures, goals, and progress note at next visit. Pt encouraged to continue HEP and follow-up as scheduled. Hewould benefit from further skilled PT intervention to maximize safety, mobility, and independence.                       PT Short Term Goals - 07/07/19 1026      PT SHORT TERM GOAL #1   Title  Pt will be independent with HEP in order to improve strength and balance in order to decrease fall risk and improve function at home    Time  6    Period  Weeks    Status  On-going    Target Date  07/14/19        PT Long Term Goals - 07/09/19 1236      PT LONG TERM GOAL #1   Title  Pt will improve BERG by at least 3 points in order to demonstrate clinically significant improvement in balance.    Baseline  06/02/19: 48/56;  07/08/19: 54/56    Time  12    Period  Weeks    Status  Achieved      PT LONG TERM GOAL #2   Title  Pt will improve ABC by at least 13% in order to demonstrate clinically significant improvement in balance confidence.    Baseline  06/02/19: 64.4%; 07/07/19: 65%    Time  12    Period  Weeks    Status  On-going    Target Date  08/25/19      PT LONG TERM GOAL #3   Title  Pt will increase 30s Sit to Stand Test to greater than or equal to 14 repetitions in order to demonstrate clinically significant improvement in LE endurance and decrease in fall risk.    Baseline  06/02/19: 10 repetitions; 07/07/19: 13 repetitions    Time  12    Period  Weeks    Status  Partially Met    Target Date  08/25/19      PT LONG TERM GOAL #4   Title  Pt will increase 6MWT by at least 165m(3232f in order to demonstrate clinically significant improvement in cardiopulmonary endurance and community ambulation    Baseline  06/10/20: [id number] AD, no LOB, Rt foot slap without flat foot strike; 07/07/19: test terminated 2/2 fall; 07/09/19: 1565' with R AFO, no assistive device, mask donned;    Time  12    Period  Weeks    Status  Revised    Target Date  08/25/19            Plan - 08/20/19 [id number] Clinical Impression Statement  Pt demonstrates excellent motivation during session today however reports feeling slightly more fatigued today. Decreased leg press resistance to match patient's fatigue. Despite fatigue he is able to complete all exercises as instructed. He demonstrates slightly improved tandem gait on 2x4  PT will need updated outcome measures, goals, recertification, and progress note at next visit. Pt encouraged to continue HEP and follow-up as scheduled. He would benefit from further skilled PT intervention to maximize safety, mobility, and independence.    Personal Factors and Comorbidities  Age;Comorbidity 3+;Time since onset of injury/illness/exacerbation    Comorbidities  Urothelial cancer, DM,  previous lumbar disc bulge, interstitial pulmonary disease  Examination-Activity Limitations  Locomotion Level;Stairs;Transfers    Examination-Participation Restrictions  Community Activity;Laundry;Shop    Rehab Potential  Fair    PT Frequency  2x / week    PT Duration  12 weeks    PT Treatment/Interventions  ADLs/Self Care Home Management;Aquatic Therapy;Biofeedback;Canalith Repostioning;Cryotherapy;Iontophoresis '4mg'$ /ml Dexamethasone;Moist Heat;Traction;Ultrasound;DME Instruction;Gait training;Stair training;Functional mobility training;Therapeutic activities;Therapeutic exercise;Balance training;Neuromuscular re-education;Patient/family education;Orthotic Fit/Training;Manual techniques;Dry needling;Energy conservation;Vestibular;Joint Manipulations    PT Next Visit Plan  Update outcome measures/goals, progress note, recertification; Continue with strengthening and balance    PT Home Exercise Plan  Access Code: [email or web address]    Consulted and Agree with Plan of Care  Patient       Patient will benefit from skilled therapeutic intervention in order to improve the following deficits and impairments:  Abnormal gait, Decreased activity tolerance, Decreased balance, Decreased strength  Visit Diagnosis: Muscle weakness (generalized)  Unsteadiness on feet     Problem List Patient Active Problem List   Diagnosis Date Noted  . Chest pain 03/04/2019  . Anemia due to antineoplastic chemotherapy 03/03/2018  . Cough 03/03/2018  . Chemotherapy induced neutropenia ([place]) 01/22/2018  . B12 deficiency 01/01/2018  . Hypomagnesemia 12/21/2017  . Encounter for antineoplastic chemotherapy 12/21/2017  . Malignant neoplasm of kidney ([place])   . Urothelial cancer ([place]) 12/18/2017  . Malnutrition of moderate degree 12/18/2017  . Therapeutic opioid induced constipation   . Palliative care encounter   . Dehydration 12/17/2017  . Goals of care, counseling/discussion 12/13/2017  . Urothelial carcinoma of  kidney, left ([place]) 12/07/2017  . Right inguinal hernia 07/17/2015  . Family history of colon cancer 07/17/2015  . Allergic rhinitis 11/12/2014  . Airway hyperreactivity 11/12/2014  . Atherosclerosis of coronary artery 11/12/2014  . Cheilitis 11/12/2014  . Narrowing of intervertebral disc space 11/12/2014  . Deflected nasal septum 11/12/2014  . HLD (hyperlipidemia) 11/12/2014  . BP (high blood pressure) 11/12/2014  . Adult hypothyroidism 11/12/2014  . Sleep apnea 11/12/2014  . Diabetes mellitus, type 2 ([place]) 11/12/2014  . Degenerative disc disease, lumbar 09/24/2012  . Furunculosis 04/23/2012   Phillips Grout PT, DPT, GCS  Evadna Donaghy 08/20/2019, 10:14 AM  [place] MA[place] [facility] SERVICES [address] [place], [place], [address] Phone: [phone]   Fax:  [phone]  Name: SHAAN RHOADS MRN: [id number] Date of Birth: 08/01/1945

## 2019-08-26 ENCOUNTER — Telehealth: Payer: Self-pay

## 2019-08-27 ENCOUNTER — Ambulatory Visit: Payer: Medicare Other

## 2019-08-27 ENCOUNTER — Other Ambulatory Visit: Payer: Self-pay

## 2019-08-27 VITALS — BP 112/74 | HR 82

## 2019-08-27 DIAGNOSIS — R2681 Unsteadiness on feet: Secondary | ICD-10-CM

## 2019-08-27 DIAGNOSIS — M6281 Muscle weakness (generalized): Secondary | ICD-10-CM | POA: Diagnosis not present

## 2019-08-27 NOTE — Telephone Encounter (Signed)
SW attempted to schedule palliative care visit with patient. [name] (patient's wife) said patient is waiting to schedule with PT and will follow-up with SW next week.

## 2019-08-27 NOTE — Therapy (Signed)
[place] MAIN [facility] SERVICES [address] [place], [place], [address] Phone: [phone]   Fax:  [phone]  Physical Therapy Treatment/Recertification/Progress Note   Dates of reporting period  07/07/19   to  08/27/19  Patient Details  Name: Patrick Mcgee MRN: [id number] Date of Birth: 1946/03/28 Referring Provider (PT): Dr. Rosanna Randy   Encounter Date: 08/27/2019  PT End of Session - 08/27/19 1256    Visit Number  20    Number of Visits  49    Date for PT Re-Evaluation  11/19/19    PT Start Time  1300    PT Stop Time  1342    PT Time Calculation (min)  42 min    Equipment Utilized During Treatment  Gait belt    Activity Tolerance  Patient tolerated treatment well    Behavior During Therapy  [facility] for tasks assessed/performed       Past Medical History:  Diagnosis Date  . Acid reflux   . Anxiety   . Depression   . Diabetes mellitus without complication ([place])   . Hyperlipidemia   . Hypertension   . MRSA (methicillin resistant Staphylococcus aureus) infection 1992   history  . Myocardial infarction ([place]) 1995  . Sleep apnea    CPAP  . Urothelial cancer ([place]) 11/2017   Left Urothelial mass, chemo tx's    Past Surgical History:  Procedure Laterality Date  . CATARACT EXTRACTION Left   . COLONOSCOPY  2010   [facility]  . COLONOSCOPY WITH PROPOFOL N/A 10/04/2016   Procedure: COLONOSCOPY WITH PROPOFOL;  Surgeon: [name], MD;  Location: [facility] ENDOSCOPY;  Service: Endoscopy;  Laterality: N/A;  . [place], 2000, 2001, 2014  . CYSTOSCOPY W/ RETROGRADES Bilateral 12/10/2017   Procedure: CYSTOSCOPY WITH RETROGRADE PYELOGRAM;  Surgeon: [name], MD;  Location: [facility] ORS;  Service: Urology;  Laterality: Bilateral;  . CYSTOSCOPY W/ RETROGRADES Left 12/09/2018   Procedure: CYSTOSCOPY WITH RETROGRADE PYELOGRAM;  Surgeon: [name], MD;  Location: [facility] ORS;  Service: Urology;  Laterality:  Left;  . CYSTOSCOPY WITH BIOPSY Left 12/09/2018   Procedure: CYSTOSCOPY WITH URETERAL/RENAL PELVIC BIOPSY;  Surgeon: [name], MD;  Location: [facility] ORS;  Service: Urology;  Laterality: Left;  . CYSTOSCOPY WITH STENT PLACEMENT Left 12/10/2017   Procedure: CYSTOSCOPY WITH STENT PLACEMENT;  Surgeon: [name], MD;  Location: [facility] ORS;  Service: Urology;  Laterality: Left;  . CYSTOSCOPY WITH STENT PLACEMENT Left 12/09/2018   Procedure: CYSTOSCOPY WITH STENT PLACEMENT;  Surgeon: [name], MD;  Location: [facility] ORS;  Service: Urology;  Laterality: Left;  . CYSTOSCOPY WITH URETEROSCOPY Left 12/09/2018   Procedure: CYSTOSCOPY WITH URETEROSCOPY;  Surgeon: [name], MD;  Location: [facility] ORS;  Service: Urology;  Laterality: Left;  . EYE SURGERY Bilateral    cataract  . INGUINAL HERNIA REPAIR Right 08/09/2015   Procedure: HERNIA REPAIR INGUINAL ADULT;  Surgeon: [name], MD;  Location: [facility] ORS;  Service: General;  Laterality: Right;  . NASAL SINUS SURGERY    . nuclear stress test    . PORTA CATH INSERTION N/A 12/19/2017   Procedure: PORTA CATH INSERTION;  Surgeon: [name], MD;  Location: [place] CV LAB;  Service: Cardiovascular;  Laterality: N/A;  . URETERAL BIOPSY Left 12/10/2017   Procedure: URETERAL & renal PELVIS BIOPSY;  Surgeon: [name], MD;  Location: [facility] ORS;  Service: Urology;  Laterality: Left;  . URETEROSCOPY Left 12/10/2017   Procedure: URETEROSCOPY;  Surgeon: [name], MD;  Location: [facility] ORS;  Service: Urology;  Laterality: Left;    Vitals:   08/27/19 1302  BP: 112/74  Pulse: 82  SpO2: 97%    Subjective Assessment - 08/27/19 1255    Subjective  Pt denies any pain today. No specific questions or concerns upon arrival. No falls or stumbles since last therapy session. He is feeling slightly "weaker" lately.    Pertinent History  Part of history borrowed from oncology note and verified with patient: Patient is a 74 year old male who was  referred for foot drop. He started with Dr. [name] so far for his metastatic urothelial carcinoma.  This was originally diagnosed in May 2019. He was started on chemotherapy in June 2019 and was continued on 05/27/2018 for 8 cycles.  He tolerated chemotherapy well except for chemo-induced anemia for which she has been getting Procrit every 2 weeks.  He did have response to his disease based on scans in August 2019.  However repeat scan on 05/31/2018 showed increase in the size of the primary tumor from 1.2 to 1.9 cm and increase in left para-aortic adenopathy from 0.9 to 1.3 cm.  Second line immunotherapy was recommended.  His initial biopsy specimen did not have enough sample to undergo FGFR mutation testing. He also has mild interstitial lung disease for which he sees pulmonary but he is not on home oxygen.  He has B12 deficiency for which he is on oral B12.  Also has diabetes and coronary artery disease. Patient noted to have disease progression in his lymph nodes in May 2020.  Repeat biopsy showed metastatic urothelial carcinoma which did not have a FGFR mutation.  He has been started on third line Padcev. There is still times when he feels fatigued but he is having more good days. He reports poor appetite. He was working with physical therapy on his foot drop, RLE weakness, and unsteadiness. He states that he gets considerable numbness for the 7-8 days following his chemotherapy treatment. He states that he is a diabetic and did have some numbness prior to starting chemotherapy. He states that he has a history of a low back disc bulge with RLE radicular symptoms. He used to get spinal injections but reports he hasn't had any injections in the last 10 years.    Limitations  Walking    Currently in Pain?  No/denies         [facility] PT Assessment - 08/27/19 1305      6 Minute Walk- Baseline   6 Minute Walk- Baseline  yes    BP (mmHg)  112/74    HR (bpm)  82    02 Sat (%RA)  97 %    Modified Borg Scale  for Dyspnea  0- Nothing at all    Perceived Rate of Exertion (Borg)  6-      6 Minute walk- Post Test   6 Minute Walk Post Test  yes    HR (bpm)  115    02 Sat (%RA)  100 %    Modified Borg Scale for Dyspnea  6-    Perceived Rate of Exertion (Borg)  8-      6 minute walk test results    Aerobic Endurance Distance Walked  1615    Endurance additional comments  no assistive device, mask donned      Standardized Balance Assessment   Standardized Balance Assessment  Chief Technology Officer Test   Sit  to Stand  Able to stand without using hands and stabilize independently    Standing Unsupported  Able to stand safely 2 minutes    Sitting with Back Unsupported but Feet Supported on Floor or Stool  Able to sit safely and securely 2 minutes    Stand to Sit  Sits safely with minimal use of hands    Transfers  Able to transfer safely, minor use of hands    Standing Unsupported with Eyes Closed  Able to stand 10 seconds safely    Standing Unsupported with Feet Together  Able to place feet together independently and stand 1 minute safely    From Standing, Reach Forward with Outstretched Arm  Can reach confidently >25 cm (10")    From Standing Position, Pick up Object from Floor  Able to pick up shoe safely and easily    From Standing Position, Turn to Look Behind Over each Shoulder  Looks behind from both sides and weight shifts well    Turn 360 Degrees  Able to turn 360 degrees safely in 4 seconds or less    Standing Unsupported, Alternately Place Feet on Step/Stool  Able to stand independently and safely and complete 8 steps in 20 seconds    Standing Unsupported, One Foot in Front  Able to plae foot ahead of the other independently and hold 30 seconds    Standing on One Leg  Able to lift leg independently and hold equal to or more than 3 seconds    Total Score  53      Dynamic Gait Index   Level Surface  Normal    Change in Gait Speed  Normal    Gait with Horizontal Head Turns   Normal    Gait with Vertical Head Turns  Normal    Gait and Pivot Turn  Normal    Step Over Obstacle  Normal    Step Around Obstacles  Normal    Steps  Normal    Total Score  24      Functional Gait  Assessment   Gait assessed   Yes    Gait Level Surface  Walks 20 ft in less than 5.5 sec, no assistive devices, good speed, no evidence for imbalance, normal gait pattern, deviates no more than 6 in outside of the 12 in walkway width.    Change in Gait Speed  Able to smoothly change walking speed without loss of balance or gait deviation. Deviate no more than 6 in outside of the 12 in walkway width.    Gait with Horizontal Head Turns  Performs head turns smoothly with no change in gait. Deviates no more than 6 in outside 12 in walkway width    Gait with Vertical Head Turns  Performs head turns with no change in gait. Deviates no more than 6 in outside 12 in walkway width.    Gait and Pivot Turn  Pivot turns safely within 3 sec and stops quickly with no loss of balance.    Step Over Obstacle  Is able to step over 2 stacked shoe boxes taped together (9 in total height) without changing gait speed. No evidence of imbalance.    Gait with Narrow Base of Support  Ambulates 4-7 steps.    Gait with Eyes Closed  Cannot walk 20 ft without assistance, severe gait deviations or imbalance, deviates greater than 15 in outside 12 in walkway width or will not attempt task.    Ambulating Backwards  Walks 20 ft,  no assistive devices, good speed, no evidence for imbalance, normal gait    Steps  Alternating feet, no rail.    Total Score  25          TREATMENT   Ther-ex  Updated outcome measures with patient including: ABC: 65% (65) BERG: 53/56 (54/56); DGI: 24/24 (23/24) FGA: 25/30 (never previously performed) 30s Sit to Stand Test: 14.5 reps (13 reps) 6MWT: 1615 with AFO on RLE and mask donned   Outcome measures and goals updated with patient today. Pt demonstrates consistent scores on BERG and  DGI today compared to when they were last updated. Performed FGA with patient today who scored 25/30 with difficulty performed eyes closed ambulation as well as tandem gait. His single leg balance is slightly worse today compared to when it was last tested. His 6MWT has improved to 1615' today with an AFO on his RLE and a mask donned. This is improved from his last test of 1565' and his initiat test of 1375.' His balance confidence remains below the cut-off at 65% which is unchanged from when it was last updated. His 30s Sit to Stand Test has improved to 14.5 repetitions compared to 10 repetitions 06/02/19. Pt is making excellent progress and will benefit from additional PT services to address deficits in strength, balance, and mobility in order to return to full function at home as full benefit has not yet been achieved.                       PT Short Term Goals - 08/27/19 1307      PT SHORT TERM GOAL #1   Title  Pt will be independent with HEP in order to improve strength and balance in order to decrease fall risk and improve function at home    Time  6    Period  Weeks    Status  On-going    Target Date  07/14/19        PT Long Term Goals - 08/27/19 1307      PT LONG TERM GOAL #1   Title  Pt will improve BERG by at least 3 points in order to demonstrate clinically significant improvement in balance.    Baseline  06/02/19: 48/56; 07/08/19: 54/56; 08/27/19: 53/56    Time  12    Period  Weeks    Status  Achieved      PT LONG TERM GOAL #2   Title  Pt will improve ABC by at least 13% in order to demonstrate clinically significant improvement in balance confidence.    Baseline  06/02/19: 64.4%; 07/07/19: 65%; 08/27/19: 65%    Time  12    Period  Weeks    Status  On-going    Target Date  11/19/19      PT LONG TERM GOAL #3   Title  Pt will increase 30s Sit to Stand Test to greater than or equal to 14 repetitions in order to demonstrate clinically significant improvement  in LE endurance and decrease in fall risk.    Baseline  06/02/19: 10 repetitions; 07/07/19: 13 repetitions; 08/27/19: 14.5 repetitions    Time  12    Period  Weeks    Status  Achieved      PT LONG TERM GOAL #4   Title  Pt will increase 6MWT by at least 181m(3222f in order to demonstrate clinically significant improvement in cardiopulmonary endurance and community ambulation    Baseline  06/10/20: 137564fno  AD, no LOB, Rt foot slap without flat foot strike; 07/07/19: test terminated 2/2 fall; 07/09/19: 1565' with R AFO, no assistive device, mask donned; 08/27/19: 1615' with R AFO, no assistive, mask donned    Time  12    Period  Weeks    Status  Partially Met    Target Date  11/19/19      PT LONG TERM GOAL #5   Title  Pt will improve FGA by at least 4 points in order to demonstrate clinically significant improvement in balance and decreased risk for falls.    Baseline  08/27/19; 25/30    Time  12    Period  Weeks    Status  New    Target Date  11/19/19            Plan - 08/27/19 1256    Clinical Impression Statement  Outcome measures and goals updated with patient today. Pt demonstrates consistent scores on BERG and DGI today compared to when they were last updated. Performed FGA with patient today who scored 25/30 with difficulty performed eyes closed ambulation as well as tandem gait. His single leg balance is slightly worse today compared to when it was last tested. His 6MWT has improved to 1615' today with an AFO on his RLE and a mask donned. This is improved from his last test of 1565' and his initiat test of 1375.' His balance confidence remains below the cut-off at 65% which is unchanged from when it was last updated. His 30s Sit to Stand Test has improved to 14.5 repetitions compared to 10 repetitions 06/02/19. Pt is making excellent progress and will benefit from additional PT services to address deficits in strength, balance, and mobility in order to return to full function at  home as full benefit has not yet been achieved.    Personal Factors and Comorbidities  Age;Comorbidity 3+;Time since onset of injury/illness/exacerbation    Comorbidities  Urothelial cancer, DM, previous lumbar disc bulge, interstitial pulmonary disease    Examination-Activity Limitations  Locomotion Level;Stairs;Transfers    Examination-Participation Restrictions  Community Activity;Laundry;Shop    Rehab Potential  Fair    PT Frequency  2x / week    PT Duration  12 weeks    PT Treatment/Interventions  ADLs/Self Care Home Management;Aquatic Therapy;Biofeedback;Canalith Repostioning;Cryotherapy;Iontophoresis 45m/ml Dexamethasone;Moist Heat;Traction;Ultrasound;DME Instruction;Gait training;Stair training;Functional mobility training;Therapeutic activities;Therapeutic exercise;Balance training;Neuromuscular re-education;Patient/family education;Orthotic Fit/Training;Manual techniques;Dry needling;Energy conservation;Vestibular;Joint Manipulations    PT Next Visit Plan  Continue with strengthening and balance    PT Home Exercise Plan  Access Code: [email or web address]   Consulted and Agree with Plan of Care  Patient       Patient will benefit from skilled therapeutic intervention in order to improve the following deficits and impairments:  Abnormal gait, Decreased activity tolerance, Decreased balance, Decreased strength  Visit Diagnosis: Muscle weakness (generalized)  Unsteadiness on feet     Problem List Patient Active Problem List   Diagnosis Date Noted  . Chest pain 03/04/2019  . Anemia due to antineoplastic chemotherapy 03/03/2018  . Cough 03/03/2018  . Chemotherapy induced neutropenia ([place] 01/22/2018  . B12 deficiency 01/01/2018  . Hypomagnesemia 12/21/2017  . Encounter for antineoplastic chemotherapy 12/21/2017  . Malignant [place] of [place] ([place]   . Urothelial cancer ([place] 12/18/2017  . Malnutrition of moderate degree 12/18/2017  . Therapeutic opioid induced constipation   .  Palliative care encounter   . Dehydration 12/17/2017  . Goals of care, counseling/discussion 12/13/2017  . Urothelial carcinoma of kidney, left ([place] 12/07/2017  .  Right inguinal hernia 07/17/2015  . Family history of colon cancer 07/17/2015  . Allergic rhinitis 11/12/2014  . Airway hyperreactivity 11/12/2014  . Atherosclerosis of coronary artery 11/12/2014  . Cheilitis 11/12/2014  . Narrowing of intervertebral disc space 11/12/2014  . Deflected nasal septum 11/12/2014  . HLD (hyperlipidemia) 11/12/2014  . BP (high blood pressure) 11/12/2014  . Adult hypothyroidism 11/12/2014  . Sleep apnea 11/12/2014  . Diabetes mellitus, type 2 ([place]) 11/12/2014  . Degenerative disc disease, lumbar 09/24/2012  . Furunculosis 04/23/2012   Phillips Grout PT, DPT, GCS  Huprich,Jason 08/27/2019, 2:55 PM  [place] MAIN [facility] SERVICES [address] [place], [place], [address] Phone: [phone]   Fax:  [phone]  Name: JOHNTAVIOUS FRANCOM MRN: [id number] Date of Birth: 02-04-1946

## 2019-08-28 ENCOUNTER — Telehealth: Payer: Self-pay

## 2019-08-28 NOTE — Telephone Encounter (Signed)
Telephone call to patient to schedule palliative care visit with patient. Patient/family in agreement with telephonic visit on 09-11-19 at 3:30PM.

## 2019-09-01 ENCOUNTER — Other Ambulatory Visit: Payer: Self-pay

## 2019-09-01 ENCOUNTER — Ambulatory Visit: Payer: Medicare Other

## 2019-09-01 DIAGNOSIS — R2681 Unsteadiness on feet: Secondary | ICD-10-CM | POA: Diagnosis not present

## 2019-09-01 DIAGNOSIS — M6281 Muscle weakness (generalized): Secondary | ICD-10-CM | POA: Diagnosis not present

## 2019-09-01 NOTE — Therapy (Signed)
[place] [facility] SERVICES [address] [place], [place], [address] Phone: [phone]   Fax:  [phone]  Physical Therapy Treatment  Patient Details  Name: Patrick Mcgee MRN: [id number] Date of Birth: [birth year] Referring Provider (PT): Dr. Rosanna Randy   Encounter Date: 09/01/2019  PT End of Session - 09/01/19 0936    Visit Number  21    Number of Visits  49    Date for PT Re-Evaluation  11/19/19    PT Start Time  0931    PT Stop Time  1015    PT Time Calculation (min)  44 min    Equipment Utilized During Treatment  Gait belt    Activity Tolerance  Patient tolerated treatment well    Behavior During Therapy  [facility] for tasks assessed/performed       Past Medical History:  Diagnosis Date  . Acid reflux   . Anxiety   . Depression   . Diabetes mellitus without complication ([place])   . Hyperlipidemia   . Hypertension   . MRSA (methicillin resistant Staphylococcus aureus) infection 1992   history  . Myocardial infarction ([place]) 1995  . Sleep apnea    CPAP  . Urothelial cancer ([place]) 11/2017   Left Urothelial mass, chemo tx's    Past Surgical History:  Procedure Laterality Date  . CATARACT EXTRACTION Left   . COLONOSCOPY  2010   [facility]  . COLONOSCOPY WITH PROPOFOL N/A 10/04/2016   Procedure: COLONOSCOPY WITH PROPOFOL;  Surgeon: [name], MD;  Location: [facility] ENDOSCOPY;  Service: Endoscopy;  Laterality: N/A;  . [place], 2000, 2001, 2014  . CYSTOSCOPY W/ RETROGRADES Bilateral 12/10/2017   Procedure: CYSTOSCOPY WITH RETROGRADE PYELOGRAM;  Surgeon: [name], MD;  Location: [facility] ORS;  Service: Urology;  Laterality: Bilateral;  . CYSTOSCOPY W/ RETROGRADES Left 12/09/2018   Procedure: CYSTOSCOPY WITH RETROGRADE PYELOGRAM;  Surgeon: [name], MD;  Location: [facility] ORS;  Service: Urology;  Laterality: Left;  . CYSTOSCOPY WITH BIOPSY Left 12/09/2018   Procedure: CYSTOSCOPY WITH  URETERAL/RENAL PELVIC BIOPSY;  Surgeon: [name], MD;  Location: [facility] ORS;  Service: Urology;  Laterality: Left;  . CYSTOSCOPY WITH STENT PLACEMENT Left 12/10/2017   Procedure: CYSTOSCOPY WITH STENT PLACEMENT;  Surgeon: [name], MD;  Location: [facility] ORS;  Service: Urology;  Laterality: Left;  . CYSTOSCOPY WITH STENT PLACEMENT Left 12/09/2018   Procedure: CYSTOSCOPY WITH STENT PLACEMENT;  Surgeon: [name], MD;  Location: [facility] ORS;  Service: Urology;  Laterality: Left;  . CYSTOSCOPY WITH URETEROSCOPY Left 12/09/2018   Procedure: CYSTOSCOPY WITH URETEROSCOPY;  Surgeon: [name], MD;  Location: [facility] ORS;  Service: Urology;  Laterality: Left;  . EYE SURGERY Bilateral    cataract  . INGUINAL HERNIA REPAIR Right 08/09/2015   Procedure: HERNIA REPAIR INGUINAL ADULT;  Surgeon: [name], MD;  Location: [facility] ORS;  Service: General;  Laterality: Right;  . NASAL SINUS SURGERY    . nuclear stress test    . PORTA CATH INSERTION N/A 12/19/2017   Procedure: PORTA CATH INSERTION;  Surgeon: [name], MD;  Location: [place] CV LAB;  Service: Cardiovascular;  Laterality: N/A;  . URETERAL BIOPSY Left 12/10/2017   Procedure: URETERAL & renal PELVIS BIOPSY;  Surgeon: [name], MD;  Location: [facility] ORS;  Service: Urology;  Laterality: Left;  . URETEROSCOPY Left 12/10/2017   Procedure: URETEROSCOPY;  Surgeon: [name], MD;  Location: [facility] ORS;  Service: Urology;  Laterality: Left;  There were no vitals filed for this visit.  Subjective Assessment - 09/01/19 0934    Subjective  Pt denies any pain today. No specific questions or concerns upon arrival. No falls or stumbles since last therapy session. Restarts cancer treatment next week.    Pertinent History  Part of history borrowed from oncology note and verified with patient: Patient is a 74 year old male who was referred for foot drop. He started with Dr. [name] so far for his metastatic urothelial carcinoma.  This  was originally diagnosed in May 2019. He was started on chemotherapy in June 2019 and was continued on 05/27/2018 for 8 cycles.  He tolerated chemotherapy well except for chemo-induced anemia for which she has been getting Procrit every 2 weeks.  He did have response to his disease based on scans in August 2019.  However repeat scan on 05/31/2018 showed increase in the size of the primary tumor from 1.2 to 1.9 cm and increase in left para-aortic adenopathy from 0.9 to 1.3 cm.  Second line immunotherapy was recommended.  His initial biopsy specimen did not have enough sample to undergo FGFR mutation testing. He also has mild interstitial lung disease for which he sees pulmonary but he is not on home oxygen.  He has B12 deficiency for which he is on oral B12.  Also has diabetes and coronary artery disease. Patient noted to have disease progression in his lymph nodes in May 2020.  Repeat biopsy showed metastatic urothelial carcinoma which did not have a FGFR mutation.  He has been started on third line Padcev. There is still times when he feels fatigued but he is having more good days. He reports poor appetite. He was working with physical therapy on his foot drop, RLE weakness, and unsteadiness. He states that he gets considerable numbness for the 7-8 days following his chemotherapy treatment. He states that he is a diabetic and did have some numbness prior to starting chemotherapy. He states that he has a history of a low back disc bulge with RLE radicular symptoms. He used to get spinal injections but reports he hasn't had any injections in the last 10 years.    Limitations  Walking    Currently in Pain?  No/denies         TREATMENT   Ther-ex Octane intervals, 45s each, L4/8 x 5 minutes total for warm-up during history (4 minutes unbilled); Precor BLE leg press 75# x20, 85# x 20; Precor BLE heel raise55# x20; Standing heel raises with BUE support x 20; Standing hip flexion march, hip extension,  hip abduction, and HS curls with 4# ankle weights x 20 each; Seated LAQ with 4# ankle weights x 20 each; Sit to stand from regular height chair without UE support x 20 with CGA only; Matrix resisted gait 17.5# forward/backward/R lateral/L lateral x 3 each direction;   Neuromuscular Re-education  1/2 foam roll tandem gait without UE support alteranting forward LE x 30s with each foot forward; 1/2 foam roll without UE support static balance x 30s; 1/2 foam roll heel/toe rocking without UE support x 10 each direction; Tandem gaitin // bars without UE support x 6 lengths;   Pt educated throughout session about proper posture and technique with exercises. Improved exercise technique, movement at target joints, use of target muscles after min to mod verbal, visual, tactile cues.   Ptdemonstrates excellent motivation during session today.  He is able to increase the resistance on the leg press today back to where he was during previous  sessions. Reintroduced resisted gait. He continues to struggle with tandem balance on 1/2 foam roll.Pt encouraged to continue HEP and follow-up as scheduled. Hewould benefit from further skilled PT intervention to maximize safety, mobility, and independence.                        PT Short Term Goals - 08/27/19 1307      PT SHORT TERM GOAL #1   Title  Pt will be independent with HEP in order to improve strength and balance in order to decrease fall risk and improve function at home    Time  6    Period  Weeks    Status  On-going    Target Date  07/14/19        PT Long Term Goals - 08/27/19 1307      PT LONG TERM GOAL #1   Title  Pt will improve BERG by at least 3 points in order to demonstrate clinically significant improvement in balance.    Baseline  06/02/19: 48/56; 07/08/19: 54/56; 08/27/19: 53/56    Time  12    Period  Weeks    Status  Achieved      PT LONG TERM GOAL #2   Title  Pt will improve ABC by at least 13%  in order to demonstrate clinically significant improvement in balance confidence.    Baseline  06/02/19: 64.4%; 07/07/19: 65%; 08/27/19: 65%    Time  12    Period  Weeks    Status  On-going    Target Date  11/19/19      PT LONG TERM GOAL #3   Title  Pt will increase 30s Sit to Stand Test to greater than or equal to 14 repetitions in order to demonstrate clinically significant improvement in LE endurance and decrease in fall risk.    Baseline  06/02/19: 10 repetitions; 07/07/19: 13 repetitions; 08/27/19: 14.5 repetitions    Time  12    Period  Weeks    Status  Achieved      PT LONG TERM GOAL #4   Title  Pt will increase 6MWT by at least 145m(3261f in order to demonstrate clinically significant improvement in cardiopulmonary endurance and community ambulation    Baseline  06/10/20: 137511fno AD, no LOB, Rt foot slap without flat foot strike; 07/07/19: test terminated 2/2 fall; 07/09/19: 1565' with R AFO, no assistive device, mask donned; 08/27/19: 1615' with R AFO, no assistive, mask donned    Time  12    Period  Weeks    Status  Partially Met    Target Date  11/19/19      PT LONG TERM GOAL #5   Title  Pt will improve FGA by at least 4 points in order to demonstrate clinically significant improvement in balance and decreased risk for falls.    Baseline  08/27/19; 25/30    Time  12    Period  Weeks    Status  New    Target Date  11/19/19            Plan - 09/01/19 0936    Clinical Impression Statement  Pt demonstrates excellent motivation during session today.  He is able to increase the resistance on the leg press today back to where he was during previous sessions. Reintroduced resisted gait. He continues to struggle with tandem balance on 1/2 foam roll. Pt encouraged to continue HEP and follow-up as scheduled. He would benefit from  further skilled PT intervention to maximize safety, mobility, and independence.    Personal Factors and Comorbidities  Age;Comorbidity 3+;Time since  onset of injury/illness/exacerbation    Comorbidities  Urothelial cancer, DM, previous lumbar disc bulge, interstitial pulmonary disease    Examination-Activity Limitations  Locomotion Level;Stairs;Transfers    Examination-Participation Restrictions  Community Activity;Laundry;Shop    Stability/Clinical Decision Making  Evolving/Moderate complexity    Rehab Potential  Fair    PT Frequency  2x / week    PT Duration  12 weeks    PT Treatment/Interventions  ADLs/Self Care Home Management;Aquatic Therapy;Biofeedback;Canalith Repostioning;Cryotherapy;Iontophoresis '4mg'$ /ml Dexamethasone;Moist Heat;Traction;Ultrasound;DME Instruction;Gait training;Stair training;Functional mobility training;Therapeutic activities;Therapeutic exercise;Balance training;Neuromuscular re-education;Patient/family education;Orthotic Fit/Training;Manual techniques;Dry needling;Energy conservation;Vestibular;Joint Manipulations    PT Next Visit Plan  Continue with strengthening and balance    PT Home Exercise Plan  Access Code: [email or web address]    Consulted and Agree with Plan of Care  Patient       Patient will benefit from skilled therapeutic intervention in order to improve the following deficits and impairments:  Abnormal gait, Decreased activity tolerance, Decreased balance, Decreased strength  Visit Diagnosis: Muscle weakness (generalized)  Unsteadiness on feet     Problem List Patient Active Problem List   Diagnosis Date Noted  . Chest pain 03/04/2019  . Anemia due to antineoplastic chemotherapy 03/03/2018  . Cough 03/03/2018  . Chemotherapy induced neutropenia ([place]) 01/22/2018  . B12 deficiency 01/01/2018  . Hypomagnesemia 12/21/2017  . Encounter for antineoplastic chemotherapy 12/21/2017  . Malignant neoplasm of kidney ([place])   . Urothelial cancer ([place]) 12/18/2017  . Malnutrition of moderate degree 12/18/2017  . Therapeutic opioid induced constipation   . Palliative care encounter   . Dehydration 12/17/2017   . Goals of care, counseling/discussion 12/13/2017  . Urothelial carcinoma of kidney, left ([place]) 12/07/2017  . Right inguinal hernia 07/17/2015  . Family history of colon cancer 07/17/2015  . Allergic rhinitis 11/12/2014  . Airway hyperreactivity 11/12/2014  . Atherosclerosis of coronary artery 11/12/2014  . Cheilitis 11/12/2014  . Narrowing of intervertebral disc space 11/12/2014  . Deflected nasal septum 11/12/2014  . HLD (hyperlipidemia) 11/12/2014  . BP (high blood pressure) 11/12/2014  . Adult hypothyroidism 11/12/2014  . Sleep apnea 11/12/2014  . Diabetes mellitus, type 2 ([place]) 11/12/2014  . Degenerative disc disease, lumbar 09/24/2012  . Furunculosis 04/23/2012   Phillips Grout PT, DPT, GCS  Luan Maberry 09/01/2019, 12:55 PM  [place] [facility] SERVICES [address] [place], [place], [address] Phone: [phone]   Fax:  [phone]  Name: SHAYDE GERVACIO MRN: [id number] Date of Birth: [birth year]

## 2019-09-02 ENCOUNTER — Other Ambulatory Visit: Payer: Self-pay | Admitting: Family Medicine

## 2019-09-04 ENCOUNTER — Other Ambulatory Visit: Payer: Self-pay

## 2019-09-04 ENCOUNTER — Ambulatory Visit: Payer: Medicare Other

## 2019-09-04 DIAGNOSIS — M6281 Muscle weakness (generalized): Secondary | ICD-10-CM | POA: Diagnosis not present

## 2019-09-04 DIAGNOSIS — R2681 Unsteadiness on feet: Secondary | ICD-10-CM | POA: Diagnosis not present

## 2019-09-04 NOTE — Therapy (Signed)
[place] MAIN [facility] SERVICES [address] [place], [place], [address] Phone: [phone]   Fax:  [phone]  Physical Therapy Treatment  Patient Details  Name: Patrick Mcgee MRN: [id number] Date of Birth: 31-May-1946 Referring Provider (PT): Dr. Rosanna Randy   Encounter Date: 09/04/2019  PT End of Session - 09/04/19 1035    Visit Number  22    Number of Visits  49    Date for PT Re-Evaluation  11/19/19    PT Start Time  7253    PT Stop Time  1100    PT Time Calculation (min)  45 min    Equipment Utilized During Treatment  Gait belt    Activity Tolerance  Patient tolerated treatment well    Behavior During Therapy  [facility] for tasks assessed/performed       Past Medical History:  Diagnosis Date  . Acid reflux   . Anxiety   . Depression   . Diabetes mellitus without complication ([place])   . Hyperlipidemia   . Hypertension   . MRSA (methicillin resistant Staphylococcus aureus) infection 1992   history  . Myocardial infarction ([place]) 1995  . Sleep apnea    CPAP  . Urothelial cancer ([place]) 11/2017   Left Urothelial mass, chemo tx's    Past Surgical History:  Procedure Laterality Date  . CATARACT EXTRACTION Left   . COLONOSCOPY  2010   [facility]  . COLONOSCOPY WITH PROPOFOL N/A 10/04/2016   Procedure: COLONOSCOPY WITH PROPOFOL;  Surgeon: [name], MD;  Location: [facility] ENDOSCOPY;  Service: Endoscopy;  Laterality: N/A;  . [place], 2000, 2001, 2014  . CYSTOSCOPY W/ RETROGRADES Bilateral 12/10/2017   Procedure: CYSTOSCOPY WITH RETROGRADE PYELOGRAM;  Surgeon: [name], MD;  Location: [facility] ORS;  Service: Urology;  Laterality: Bilateral;  . CYSTOSCOPY W/ RETROGRADES Left 12/09/2018   Procedure: CYSTOSCOPY WITH RETROGRADE PYELOGRAM;  Surgeon: [name], MD;  Location: [facility] ORS;  Service: Urology;  Laterality: Left;  . CYSTOSCOPY WITH BIOPSY Left 12/09/2018   Procedure: CYSTOSCOPY WITH  URETERAL/RENAL PELVIC BIOPSY;  Surgeon: [name], MD;  Location: [facility] ORS;  Service: Urology;  Laterality: Left;  . CYSTOSCOPY WITH STENT PLACEMENT Left 12/10/2017   Procedure: CYSTOSCOPY WITH STENT PLACEMENT;  Surgeon: [name], MD;  Location: [facility] ORS;  Service: Urology;  Laterality: Left;  . CYSTOSCOPY WITH STENT PLACEMENT Left 12/09/2018   Procedure: CYSTOSCOPY WITH STENT PLACEMENT;  Surgeon: [name], MD;  Location: [facility] ORS;  Service: Urology;  Laterality: Left;  . CYSTOSCOPY WITH URETEROSCOPY Left 12/09/2018   Procedure: CYSTOSCOPY WITH URETEROSCOPY;  Surgeon: [name], MD;  Location: [facility] ORS;  Service: Urology;  Laterality: Left;  . EYE SURGERY Bilateral    cataract  . INGUINAL HERNIA REPAIR Right 08/09/2015   Procedure: HERNIA REPAIR INGUINAL ADULT;  Surgeon: [name], MD;  Location: [facility] ORS;  Service: General;  Laterality: Right;  . NASAL SINUS SURGERY    . nuclear stress test    . PORTA CATH INSERTION N/A 12/19/2017   Procedure: PORTA CATH INSERTION;  Surgeon: [name], MD;  Location: [place] CV LAB;  Service: Cardiovascular;  Laterality: N/A;  . URETERAL BIOPSY Left 12/10/2017   Procedure: URETERAL & renal PELVIS BIOPSY;  Surgeon: [name], MD;  Location: [facility] ORS;  Service: Urology;  Laterality: Left;  . URETEROSCOPY Left 12/10/2017   Procedure: URETEROSCOPY;  Surgeon: [name], MD;  Location: [facility] ORS;  Service: Urology;  Laterality: Left;  There were no vitals filed for this visit.  Subjective Assessment - 09/04/19 1028    Subjective  Pt reports that he is doing well today. He denies any pain. No LOB or stumbles. He has developed a small sore on the top of his R foot from his ankle weights. He is watching it and plans to pad the bottom of the weight.    Pertinent History  Part of history borrowed from oncology note and verified with patient: Patient is a 74 year old male who was referred for foot drop. He started with Dr.  [name] so far for his metastatic urothelial carcinoma.  This was originally diagnosed in May 2019. He was started on chemotherapy in June 2019 and was continued on 05/27/2018 for 8 cycles.  He tolerated chemotherapy well except for chemo-induced anemia for which she has been getting Procrit every 2 weeks.  He did have response to his disease based on scans in August 2019.  However repeat scan on 05/31/2018 showed increase in the size of the primary tumor from 1.2 to 1.9 cm and increase in left para-aortic adenopathy from 0.9 to 1.3 cm.  Second line immunotherapy was recommended.  His initial biopsy specimen did not have enough sample to undergo FGFR mutation testing. He also has mild interstitial lung disease for which he sees pulmonary but he is not on home oxygen.  He has B12 deficiency for which he is on oral B12.  Also has diabetes and coronary artery disease. Patient noted to have disease progression in his lymph nodes in May 2020.  Repeat biopsy showed metastatic urothelial carcinoma which did not have a FGFR mutation.  He has been started on third line Padcev. There is still times when he feels fatigued but he is having more good days. He reports poor appetite. He was working with physical therapy on his foot drop, RLE weakness, and unsteadiness. He states that he gets considerable numbness for the 7-8 days following his chemotherapy treatment. He states that he is a diabetic and did have some numbness prior to starting chemotherapy. He states that he has a history of a low back disc bulge with RLE radicular symptoms. He used to get spinal injections but reports he hasn't had any injections in the last 10 years.    Limitations  Walking    Currently in Pain?  No/denies             TREATMENT   Ther-ex Octane intervals, 45s each, L4/8 x 5 minutes total for warm-up during history (4 minutes unbilled); Precor BLE leg press 85# x 20, 100# x 20; Precor BLE heel raise55# x20; Standing heel  raises without UE support x 20; Standing hip flexion march, hip extension, hip abduction, and HS curls with5# ankle weights x 20 each; Seated LAQ with5# ankle weights x 20 each; Sit to stand from regular height chair without UE support x 15with CGA only;   Neuromuscular Re-education Airex balance beam tandem gait forward/backward without UE support, cues required for slow speed x 4 lengths each; Airex balance beam side stepping without UE support, cues required for slow speed x 4 lengths each; Airex balance beam side stepping without UE support, cues required for slow speed, horizontal head turns during stepping x 4 lengths each; 1/2 foam roll tandem balance without UE support alteranting forward LE x 30s with each foot forward;   Pt educated throughout session about proper posture and technique with exercises. Improved exercise technique, movement at target joints, use of target muscles  after min to mod verbal, visual, tactile cues.   Ptdemonstrates excellent motivation during session today. He is able to increase the resistance on the leg press today and does more weight today than he has during any previous session. Increased ankle weights to 5# and performed sit to stands for endurance again today. He demonstrates improved medial/lateral ankle stability today on 1/2 foam roll and Airex balance beam.Pt encouraged to continue HEP and follow-up as scheduled. Hewould benefit from further skilled PT intervention to maximize safety, mobility, and independence.                     PT Short Term Goals - 08/27/19 1307      PT SHORT TERM GOAL #1   Title  Pt will be independent with HEP in order to improve strength and balance in order to decrease fall risk and improve function at home    Time  6    Period  Weeks    Status  On-going    Target Date  07/14/19        PT Long Term Goals - 08/27/19 1307      PT LONG TERM GOAL #1   Title  Pt will improve BERG by  at least 3 points in order to demonstrate clinically significant improvement in balance.    Baseline  06/02/19: 48/56; 07/08/19: 54/56; 08/27/19: 53/56    Time  12    Period  Weeks    Status  Achieved      PT LONG TERM GOAL #2   Title  Pt will improve ABC by at least 13% in order to demonstrate clinically significant improvement in balance confidence.    Baseline  06/02/19: 64.4%; 07/07/19: 65%; 08/27/19: 65%    Time  12    Period  Weeks    Status  On-going    Target Date  11/19/19      PT LONG TERM GOAL #3   Title  Pt will increase 30s Sit to Stand Test to greater than or equal to 14 repetitions in order to demonstrate clinically significant improvement in LE endurance and decrease in fall risk.    Baseline  06/02/19: 10 repetitions; 07/07/19: 13 repetitions; 08/27/19: 14.5 repetitions    Time  12    Period  Weeks    Status  Achieved      PT LONG TERM GOAL #4   Title  Pt will increase 6MWT by at least 133m(3216f in order to demonstrate clinically significant improvement in cardiopulmonary endurance and community ambulation    Baseline  06/10/20: 137565fno AD, no LOB, Rt foot slap without flat foot strike; 07/07/19: test terminated 2/2 fall; 07/09/19: 1565' with R AFO, no assistive device, mask donned; 08/27/19: 1615' with R AFO, no assistive, mask donned    Time  12    Period  Weeks    Status  Partially Met    Target Date  11/19/19      PT LONG TERM GOAL #5   Title  Pt will improve FGA by at least 4 points in order to demonstrate clinically significant improvement in balance and decreased risk for falls.    Baseline  08/27/19; 25/30    Time  12    Period  Weeks    Status  New    Target Date  11/19/19            Plan - 09/04/19 1036    Clinical Impression Statement  Pt demonstrates excellent motivation  during session today.  He is able to increase the resistance on the leg press today and does more weight today than he has during any previous session. Increased ankle weights  to 5# and performed sit to stands for endurance again today. He demonstrates improved medial/lateral ankle stability today on 1/2 foam roll and Airex balance beam. Pt encouraged to continue HEP and follow-up as scheduled. He would benefit from further skilled PT intervention to maximize safety, mobility, and independence.    Personal Factors and Comorbidities  Age;Comorbidity 3+;Time since onset of injury/illness/exacerbation    Comorbidities  Urothelial cancer, DM, previous lumbar disc bulge, interstitial pulmonary disease    Examination-Activity Limitations  Locomotion Level;Stairs;Transfers    Examination-Participation Restrictions  Community Activity;Laundry;Shop    Stability/Clinical Decision Making  Evolving/Moderate complexity    Rehab Potential  Fair    PT Frequency  2x / week    PT Duration  12 weeks    PT Treatment/Interventions  ADLs/Self Care Home Management;Aquatic Therapy;Biofeedback;Canalith Repostioning;Cryotherapy;Iontophoresis '4mg'$ /ml Dexamethasone;Moist Heat;Traction;Ultrasound;DME Instruction;Gait training;Stair training;Functional mobility training;Therapeutic activities;Therapeutic exercise;Balance training;Neuromuscular re-education;Patient/family education;Orthotic Fit/Training;Manual techniques;Dry needling;Energy conservation;Vestibular;Joint Manipulations    PT Next Visit Plan  Continue with strengthening and balance    PT Home Exercise Plan  Access Code: [email or web address]    Consulted and Agree with Plan of Care  Patient       Patient will benefit from skilled therapeutic intervention in order to improve the following deficits and impairments:  Abnormal gait, Decreased activity tolerance, Decreased balance, Decreased strength  Visit Diagnosis: Muscle weakness (generalized)  Unsteadiness on feet     Problem List Patient Active Problem List   Diagnosis Date Noted  . Chest pain 03/04/2019  . Anemia due to antineoplastic chemotherapy 03/03/2018  . Cough 03/03/2018  .  Chemotherapy induced neutropenia ([place]) 01/22/2018  . B12 deficiency 01/01/2018  . Hypomagnesemia 12/21/2017  . Encounter for antineoplastic chemotherapy 12/21/2017  . Malignant neoplasm of kidney ([place])   . Urothelial cancer ([place]) 12/18/2017  . Malnutrition of moderate degree 12/18/2017  . Therapeutic opioid induced constipation   . Palliative care encounter   . Dehydration 12/17/2017  . Goals of care, counseling/discussion 12/13/2017  . Urothelial carcinoma of kidney, left ([place]) 12/07/2017  . Right inguinal hernia 07/17/2015  . Family history of colon cancer 07/17/2015  . Allergic rhinitis 11/12/2014  . Airway hyperreactivity 11/12/2014  . Atherosclerosis of coronary artery 11/12/2014  . Cheilitis 11/12/2014  . Narrowing of intervertebral disc space 11/12/2014  . Deflected nasal septum 11/12/2014  . HLD (hyperlipidemia) 11/12/2014  . BP (high blood pressure) 11/12/2014  . Adult hypothyroidism 11/12/2014  . Sleep apnea 11/12/2014  . Diabetes mellitus, type 2 ([place]) 11/12/2014  . Degenerative disc disease, lumbar 09/24/2012  . Furunculosis 04/23/2012   Phillips Grout PT, DPT, GCS  Ketura Sirek 09/04/2019, 11:09 AM  [place] MAIN [facility] SERVICES [address] [place], [place], [address] Phone: [phone]   Fax:  [phone]  Name: Patrick Mcgee MRN: [id number] Date of Birth: 04/07/1946

## 2019-09-08 ENCOUNTER — Encounter: Payer: Self-pay | Admitting: Oncology

## 2019-09-08 ENCOUNTER — Ambulatory Visit: Payer: Medicare Other | Attending: Family Medicine

## 2019-09-08 ENCOUNTER — Other Ambulatory Visit: Payer: Self-pay

## 2019-09-08 DIAGNOSIS — R2681 Unsteadiness on feet: Secondary | ICD-10-CM | POA: Diagnosis not present

## 2019-09-08 DIAGNOSIS — M6281 Muscle weakness (generalized): Secondary | ICD-10-CM | POA: Insufficient documentation

## 2019-09-08 NOTE — Progress Notes (Signed)
Patient stated that he had been doing well with no concerns. 

## 2019-09-08 NOTE — Therapy (Signed)
[place] MAIN [facility] SERVICES [address] [place], [place], [address] Phone: [phone]   Fax:  [phone]  Physical Therapy Treatment  Patient Details  Name: Patrick Mcgee MRN: [id number] Date of Birth: 09-Oct-1945 Referring Provider (PT): Dr. Rosanna Randy   Encounter Date: 09/08/2019  PT End of Session - 09/08/19 1438    Visit Number  23    Number of Visits  49    Date for PT Re-Evaluation  11/19/19    PT Start Time  1430    PT Stop Time  1510    PT Time Calculation (min)  40 min    Equipment Utilized During Treatment  Gait belt    Activity Tolerance  Patient tolerated treatment well;No increased pain    Behavior During Therapy  WFL for tasks assessed/performed       Past Medical History:  Diagnosis Date  . Acid reflux   . Anxiety   . Depression   . Diabetes mellitus without complication ([place])   . Hyperlipidemia   . Hypertension   . MRSA (methicillin resistant Staphylococcus aureus) infection 1992   history  . Myocardial infarction ([facility]) 1995  . Sleep apnea    CPAP  . Urothelial cancer ([place]) 11/2017   Left Urothelial mass, chemo tx's    Past Surgical History:  Procedure Laterality Date  . CATARACT EXTRACTION Left   . COLONOSCOPY  2010   [facility]  . COLONOSCOPY WITH PROPOFOL N/A 10/04/2016   Procedure: COLONOSCOPY WITH PROPOFOL;  Surgeon: [name], MD;  Location: [facility] ENDOSCOPY;  Service: Endoscopy;  Laterality: N/A;  . [place], 2000, 2001, 2014  . CYSTOSCOPY W/ RETROGRADES Bilateral 12/10/2017   Procedure: CYSTOSCOPY WITH RETROGRADE PYELOGRAM;  Surgeon: [name], MD;  Location: [facility] ORS;  Service: Urology;  Laterality: Bilateral;  . CYSTOSCOPY W/ RETROGRADES Left 12/09/2018   Procedure: CYSTOSCOPY WITH RETROGRADE PYELOGRAM;  Surgeon: [name], MD;  Location: [facility] ORS;  Service: Urology;  Laterality: Left;  . CYSTOSCOPY WITH BIOPSY Left 12/09/2018   Procedure:  CYSTOSCOPY WITH URETERAL/RENAL PELVIC BIOPSY;  Surgeon: [name], MD;  Location: [facility] ORS;  Service: Urology;  Laterality: Left;  . CYSTOSCOPY WITH STENT PLACEMENT Left 12/10/2017   Procedure: CYSTOSCOPY WITH STENT PLACEMENT;  Surgeon: [name], MD;  Location: [facility] ORS;  Service: Urology;  Laterality: Left;  . CYSTOSCOPY WITH STENT PLACEMENT Left 12/09/2018   Procedure: CYSTOSCOPY WITH STENT PLACEMENT;  Surgeon: [name], MD;  Location: [facility] ORS;  Service: Urology;  Laterality: Left;  . CYSTOSCOPY WITH URETEROSCOPY Left 12/09/2018   Procedure: CYSTOSCOPY WITH URETEROSCOPY;  Surgeon: [name], MD;  Location: [facility] ORS;  Service: Urology;  Laterality: Left;  . EYE SURGERY Bilateral    cataract  . INGUINAL HERNIA REPAIR Right 08/09/2015   Procedure: HERNIA REPAIR INGUINAL ADULT;  Surgeon: [name], MD;  Location: [facility] ORS;  Service: General;  Laterality: Right;  . NASAL SINUS SURGERY    . nuclear stress test    . PORTA CATH INSERTION N/A 12/19/2017   Procedure: PORTA CATH INSERTION;  Surgeon: [name], MD;  Location: [facility] CV LAB;  Service: Cardiovascular;  Laterality: N/A;  . URETERAL BIOPSY Left 12/10/2017   Procedure: URETERAL & renal PELVIS BIOPSY;  Surgeon: [name], MD;  Location: [facility] ORS;  Service: Urology;  Laterality: Left;  . URETEROSCOPY Left 12/10/2017   Procedure: URETEROSCOPY;  Surgeon: [name], MD;  Location: [facility] ORS;  Service: Urology;  Laterality: Left;    There were no vitals filed for this visit.  Subjective Assessment - 09/08/19 1435    Subjective  Pt reports doing ok today. Denies any medical updates/medication updates. He says his losartan was cut in half about 3 weeks ago. Pt reports he conitnues to have some weakness over the last few weaks, also reports losing weight recently too from not 'not eating nutritious foods.'    Pertinent History  Part of history borrowed from oncology note and verified with patient: Patient  is a 74 year old male who was referred for foot drop. He started with Dr. [name] so far for his metastatic urothelial carcinoma.  This was originally diagnosed in May 2019. He was started on chemotherapy in June 2019 and was continued on 05/27/2018 for 8 cycles.  He tolerated chemotherapy well except for chemo-induced anemia for which she has been getting Procrit every 2 weeks.  He did have response to his disease based on scans in August 2019.  However repeat scan on 05/31/2018 showed increase in the size of the primary tumor from 1.2 to 1.9 cm and increase in left para-aortic adenopathy from 0.9 to 1.3 cm.  Second line immunotherapy was recommended.  His initial biopsy specimen did not have enough sample to undergo FGFR mutation testing. He also has mild interstitial lung disease for which he sees pulmonary but he is not on home oxygen.  He has B12 deficiency for which he is on oral B12.  Also has diabetes and coronary artery disease. Patient noted to have disease progression in his lymph nodes in May 2020.  Repeat biopsy showed metastatic urothelial carcinoma which did not have a FGFR mutation.  He has been started on third line Padcev. There is still times when he feels fatigued but he is having more good days. He reports poor appetite. He was working with physical therapy on his foot drop, RLE weakness, and unsteadiness. He states that he gets considerable numbness for the 7-8 days following his chemotherapy treatment. He states that he is a diabetic and did have some numbness prior to starting chemotherapy. He states that he has a history of a low back disc bulge with RLE radicular symptoms. He used to get spinal injections but reports he hasn't had any injections in the last 10 years.    Currently in Pain?  No/denies      INTERVENTION THIS DATE    Ther-ex  Octane intervals, 45s each, L4/8 x 5 minutes total for warm-up during history (4 minutes unbilled); STS from chair, hands free 2x10, cued for  pause c balance each time at top Standing heel raises without UE support 2x20; Seated Rt ankle DF, 5lb AW across foot 2x20  Precor BLE leg press 100# x 20; 85#x20  Seated LAQ with 5# ankle weights x 20 each; (could likely be increased next session) Seated ABD brace marching 1x20 Standing hip extension, hip abduction, and HS curls with 5# ankle weights 1x20 each;     Neuromuscular Re-education   Airex pad lateral step-ups 1x10 bilat AIrex narrow stance Horizontal head turns 1x10 bilat  Airex pad narrow stance vertcal head turns 1x15 Airex pad narrow stance self ball toss x15 (3000g)     PT Short Term Goals - 08/27/19 1307      PT SHORT TERM GOAL #1   Title  Pt will be independent with HEP in order to improve strength and balance in order to decrease fall risk and improve function at home  Time  6    Period  Weeks    Status  On-going    Target Date  07/14/19        PT Long Term Goals - 08/27/19 1307      PT LONG TERM GOAL #1   Title  Pt will improve BERG by at least 3 points in order to demonstrate clinically significant improvement in balance.    Baseline  06/02/19: 48/56; 07/08/19: 54/56; 08/27/19: 53/56    Time  12    Period  Weeks    Status  Achieved      PT LONG TERM GOAL #2   Title  Pt will improve ABC by at least 13% in order to demonstrate clinically significant improvement in balance confidence.    Baseline  06/02/19: 64.4%; 07/07/19: 65%; 08/27/19: 65%    Time  12    Period  Weeks    Status  On-going    Target Date  11/19/19      PT LONG TERM GOAL #3   Title  Pt will increase 30s Sit to Stand Test to greater than or equal to 14 repetitions in order to demonstrate clinically significant improvement in LE endurance and decrease in fall risk.    Baseline  06/02/19: 10 repetitions; 07/07/19: 13 repetitions; 08/27/19: 14.5 repetitions    Time  12    Period  Weeks    Status  Achieved      PT LONG TERM GOAL #4   Title  Pt will increase 6MWT by at least 149m (3227f in order to demonstrate clinically significant improvement in cardiopulmonary endurance and community ambulation    Baseline  06/10/20: 137537fno AD, no LOB, Rt foot slap without flat foot strike; 07/07/19: test terminated 2/2 fall; 07/09/19: 1565' with R AFO, no assistive device, mask donned; 08/27/19: 1615' with R AFO, no assistive, mask donned    Time  12    Period  Weeks    Status  Partially Met    Target Date  11/19/19      PT LONG TERM GOAL #5   Title  Pt will improve FGA by at least 4 points in order to demonstrate clinically significant improvement in balance and decreased risk for falls.    Baseline  08/27/19; 25/30    Time  12    Period  Weeks    Status  New    Target Date  11/19/19            Plan - 09/08/19 1439    Clinical Impression Statement In general, patient demonstrating good tolerance to therapy session this date, reasonable accommodations are alllowed in-session to allow adequate rest between activities as needed. All interventional executed without any exacerbation of pain or other symptoms.  Pt given intermittent multimodal cues to teach best possible form with exercises. Pt continues to make steady progress toward most goals. Head turns in narrow stance remain the most provocative to balance. No home exercise updates made at this time.    Rehab Potential  Fair    PT Frequency  2x / week    PT Duration  12 weeks    PT Treatment/Interventions  ADLs/Self Care Home Management;Aquatic Therapy;Biofeedback;Canalith Repostioning;Cryotherapy;Iontophoresis '4mg'$ /ml Dexamethasone;Moist Heat;Traction;Ultrasound;DME Instruction;Gait training;Stair training;Functional mobility training;Therapeutic activities;Therapeutic exercise;Balance training;Neuromuscular re-education;Patient/family education;Orthotic Fit/Training;Manual techniques;Dry needling;Energy conservation;Vestibular;Joint Manipulations    PT Next Visit Plan  Continue with strengthening and balance    PT Home  Exercise Plan  Access Code: [email or web address] Consulted and Agree with Plan  of Care  Patient       Patient will benefit from skilled therapeutic intervention in order to improve the following deficits and impairments:  Abnormal gait, Decreased activity tolerance, Decreased balance, Decreased strength  Visit Diagnosis: Muscle weakness (generalized)  Unsteadiness on feet     Problem List Patient Active Problem List   Diagnosis Date Noted  . Chest pain 03/04/2019  . Anemia due to antineoplastic chemotherapy 03/03/2018  . Cough 03/03/2018  . Chemotherapy induced neutropenia ([place]) 01/22/2018  . B12 deficiency 01/01/2018  . Hypomagnesemia 12/21/2017  . Encounter for antineoplastic chemotherapy 12/21/2017  . Malignant neoplasm of kidney ([place])   . Urothelial cancer ([place]) 12/18/2017  . Malnutrition of moderate degree 12/18/2017  . Therapeutic opioid induced constipation   . Palliative care encounter   . Dehydration 12/17/2017  . Goals of care, counseling/discussion 12/13/2017  . Urothelial carcinoma of kidney, left ([place]) 12/07/2017  . Right inguinal hernia 07/17/2015  . Family history of colon cancer 07/17/2015  . Allergic rhinitis 11/12/2014  . Airway hyperreactivity 11/12/2014  . Atherosclerosis of coronary artery 11/12/2014  . Cheilitis 11/12/2014  . Narrowing of intervertebral disc space 11/12/2014  . Deflected nasal septum 11/12/2014  . HLD (hyperlipidemia) 11/12/2014  . BP (high blood pressure) 11/12/2014  . Adult hypothyroidism 11/12/2014  . Sleep apnea 11/12/2014  . Diabetes mellitus, type 2 ([place]) 11/12/2014  . Degenerative disc disease, lumbar 09/24/2012  . Furunculosis 04/23/2012   3:03 PM, 09/08/19 Etta Grandchild, PT, DPT Physical Therapist - [place] [phone]     Etta Grandchild 09/08/2019, 2:47 PM  [place] MAIN [facility] SERVICES [address] [place], [place], [address] Phone: [phone]   Fax:  [phone]  Name: AJAMU MAXON MRN: [id number] Date of Birth: 08/23/1945

## 2019-09-09 ENCOUNTER — Inpatient Hospital Stay (HOSPITAL_BASED_OUTPATIENT_CLINIC_OR_DEPARTMENT_OTHER): Payer: Medicare Other | Admitting: Oncology

## 2019-09-09 ENCOUNTER — Other Ambulatory Visit: Payer: Self-pay

## 2019-09-09 ENCOUNTER — Inpatient Hospital Stay: Payer: Medicare Other | Attending: Oncology | Admitting: *Deleted

## 2019-09-09 ENCOUNTER — Inpatient Hospital Stay: Payer: Medicare Other

## 2019-09-09 VITALS — BP 113/79 | HR 73 | Temp 95.9°F | Wt 155.5 lb

## 2019-09-09 DIAGNOSIS — Z5112 Encounter for antineoplastic immunotherapy: Secondary | ICD-10-CM | POA: Insufficient documentation

## 2019-09-09 DIAGNOSIS — D701 Agranulocytosis secondary to cancer chemotherapy: Secondary | ICD-10-CM

## 2019-09-09 DIAGNOSIS — C642 Malignant neoplasm of left kidney, except renal pelvis: Secondary | ICD-10-CM

## 2019-09-09 DIAGNOSIS — Z95828 Presence of other vascular implants and grafts: Secondary | ICD-10-CM

## 2019-09-09 DIAGNOSIS — Z5111 Encounter for antineoplastic chemotherapy: Secondary | ICD-10-CM | POA: Diagnosis not present

## 2019-09-09 DIAGNOSIS — T451X5A Adverse effect of antineoplastic and immunosuppressive drugs, initial encounter: Secondary | ICD-10-CM

## 2019-09-09 DIAGNOSIS — I25119 Atherosclerotic heart disease of native coronary artery with unspecified angina pectoris: Secondary | ICD-10-CM

## 2019-09-09 DIAGNOSIS — G620 Drug-induced polyneuropathy: Secondary | ICD-10-CM | POA: Diagnosis not present

## 2019-09-09 LAB — CBC WITH DIFFERENTIAL/PLATELET
Abs Immature Granulocytes: 0.02 10*3/uL (ref 0.00–0.07)
Basophils Absolute: 0.1 10*3/uL (ref 0.0–0.1)
Basophils Relative: 1 %
Eosinophils Absolute: 0.4 10*3/uL (ref 0.0–0.5)
Eosinophils Relative: 5 %
HCT: 39 % (ref 39.0–52.0)
Hemoglobin: 12.5 g/dL — ABNORMAL LOW (ref 13.0–17.0)
Immature Granulocytes: 0 %
Lymphocytes Relative: 18 %
Lymphs Abs: 1.3 10*3/uL (ref 0.7–4.0)
MCH: 28.1 pg (ref 26.0–34.0)
MCHC: 32.1 g/dL (ref 30.0–36.0)
MCV: 87.6 fL (ref 80.0–100.0)
Monocytes Absolute: 0.8 10*3/uL (ref 0.1–1.0)
Monocytes Relative: 10 %
Neutro Abs: 5.1 10*3/uL (ref 1.7–7.7)
Neutrophils Relative %: 66 %
Platelets: 187 10*3/uL (ref 150–400)
RBC: 4.45 MIL/uL (ref 4.22–5.81)
RDW: 15.9 % — ABNORMAL HIGH (ref 11.5–15.5)
WBC: 7.7 10*3/uL (ref 4.0–10.5)
nRBC: 0 % (ref 0.0–0.2)

## 2019-09-09 LAB — COMPREHENSIVE METABOLIC PANEL
ALT: 13 U/L (ref 0–44)
AST: 20 U/L (ref 15–41)
Albumin: 3.9 g/dL (ref 3.5–5.0)
Alkaline Phosphatase: 81 U/L (ref 38–126)
Anion gap: 10 (ref 5–15)
BUN: 17 mg/dL (ref 8–23)
CO2: 25 mmol/L (ref 22–32)
Calcium: 9.6 mg/dL (ref 8.9–10.3)
Chloride: 101 mmol/L (ref 98–111)
Creatinine, Ser: 0.71 mg/dL (ref 0.61–1.24)
GFR calc Af Amer: >60 mL/min (ref >60)
GFR calc non Af Amer: >60 mL/min (ref >60)
Glucose, Bld: 169 mg/dL — ABNORMAL HIGH (ref 70–99)
Potassium: 4.2 mmol/L (ref 3.5–5.1)
Sodium: 136 mmol/L (ref 135–145)
Total Bilirubin: 0.9 mg/dL (ref 0.3–1.2)
Total Protein: 6.9 g/dL (ref 6.5–8.1)

## 2019-09-09 MED ORDER — SODIUM CHLORIDE 0.9 % IV SOLN
1.0000 mg/kg | Freq: Once | INTRAVENOUS | Status: AC
  Administered 2019-09-09: 15:00:00 70 mg via INTRAVENOUS
  Filled 2019-09-09: qty 3

## 2019-09-09 MED ORDER — SODIUM CHLORIDE 0.9 % IV SOLN
Freq: Once | INTRAVENOUS | Status: AC
  Filled 2019-09-09: qty 250

## 2019-09-09 MED ORDER — SODIUM CHLORIDE 0.9% FLUSH
10.0000 mL | Freq: Once | INTRAVENOUS | Status: AC
  Administered 2019-09-09: 10 mL via INTRAVENOUS
  Filled 2019-09-09: qty 10

## 2019-09-09 MED ORDER — HEPARIN SOD (PORK) LOCK FLUSH 100 UNIT/ML IV SOLN
500.0000 [IU] | Freq: Once | INTRAVENOUS | Status: AC | PRN
  Administered 2019-09-09: 15:00:00 500 [IU]
  Filled 2019-09-09: qty 5

## 2019-09-09 MED ORDER — PALONOSETRON HCL INJECTION 0.25 MG/5ML
0.2500 mg | Freq: Once | INTRAVENOUS | Status: AC
  Administered 2019-09-09: 14:00:00 0.25 mg via INTRAVENOUS
  Filled 2019-09-09: qty 5

## 2019-09-09 MED ORDER — DEXAMETHASONE SODIUM PHOSPHATE 10 MG/ML IJ SOLN: 10.0000 mg | Freq: Once | INTRAMUSCULAR | Status: DC

## 2019-09-09 MED ORDER — HEPARIN SOD (PORK) LOCK FLUSH 100 UNIT/ML IV SOLN
INTRAVENOUS | Status: AC
  Filled 2019-09-09: qty 5

## 2019-09-09 NOTE — Progress Notes (Signed)
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 When he takes the med some days he gets sleepy and sleeps several hours

## 2019-09-10 ENCOUNTER — Ambulatory Visit: Payer: Medicare Other

## 2019-09-10 DIAGNOSIS — R2681 Unsteadiness on feet: Secondary | ICD-10-CM | POA: Diagnosis not present

## 2019-09-10 DIAGNOSIS — M6281 Muscle weakness (generalized): Secondary | ICD-10-CM

## 2019-09-10 NOTE — Progress Notes (Signed)
Hematology/Oncology Consult note 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 [name] so far for his metastatic urothelial carcinoma. This was originally diagnosed in May 2019. He was noted to have a filling defect in the lower pole collecting system of the left kidney along with para-aortic and retroperitoneal adenopathy concerning for metastatic disease. He underwent left ureteroscopy and renal pelvis biopsy which revealed small fragments of high-grade urothelial carcinoma with small focus of invasion. He was started on carboplatin and gemcitabine chemotherapy in June 2019 and was continued on 05/27/2018 for 8 cycles. He tolerated chemotherapy well except for chemo-induced anemia for which she has been getting Procrit every 2 weeks. He did have response to his disease based on scans in August 2019. However repeat scan on 05/31/2018 showed increase in the size of the primary tumor from 1.2 to 1.9 cm and increase in left para-aortic adenopathy from 0.9 to 1.3 cm. Periportal adenopathy was stable at 1.4 cm. Second line immunotherapy was recommended. His initial biopsy specimen did not have enough sample to undergo FGFR mutation testing. He also has mild interstitial lung disease for which he sees  pulmonary but he is not on home oxygen. He has B12 deficiency for which he is on oral B12. Also has diabetes and coronary artery disease.Tecentriq started on 06/17/2018.Patient noted to have disease progression in his lymph nodes in May 2020. Repeat biopsy showed metastatic urothelial carcinoma which did not have aFGFR mutation. He has been started on third line Padcev   Interval history-reports that his quality of life is much improved with 1 week on 3 weeks off treatment.  Energy levels are improved and peripheral neuropathy is stable.  There are times when he feels that his neuropathy is getting worse and he takes an extra dose of gabapentin  ECOG PS- 1 Pain scale- 0   Review of systems- Review of Systems  Constitutional: Positive for malaise/fatigue. Negative for chills, fever and weight loss.  HENT: Negative for congestion, ear discharge and nosebleeds.   Eyes: Negative for blurred vision.  Respiratory: Negative for cough, hemoptysis, sputum production, shortness of breath and wheezing.   Cardiovascular: Negative for chest pain, palpitations, orthopnea and claudication.  Gastrointestinal: Negative for abdominal pain, blood in stool, constipation, diarrhea, heartburn, melena, nausea and vomiting.  Genitourinary: Negative for dysuria, flank pain, frequency, hematuria and urgency.  Musculoskeletal: Negative for back pain, joint pain and myalgias.  Skin: Negative for rash.  Neurological: Positive for sensory change (Peripheral neuropathy). Negative for dizziness, tingling, focal weakness, seizures, weakness and headaches.  Endo/Heme/Allergies: Does not bruise/bleed easily.  Psychiatric/Behavioral: Negative for depression and suicidal ideas. The patient does not have insomnia.       Allergies  Allergen Reactions   Sulfa Antibiotics Other (See Comments)    Joint pain   Ace Inhibitors Cough   Invokana [Canagliflozin] Other (See Comments)    Leg pain   Penicillins  Rash     Did it involve swelling of the face/tongue/throat, SOB, or low BP? No Did it involve sudden or severe rash/hives, skin peeling, or any reaction on the inside of your mouth or nose? Yes Did you need to seek medical attention at a hospital or doctor's office? Yes When did it last happen?childhood allergy If all above answers are "NO", may proceed with cephalosporin use.      Past Medical History:  Diagnosis Date   Acid reflux    Anxiety    Depression    Diabetes mellitus without complication ([place])    Hyperlipidemia    Hypertension    MRSA (methicillin resistant Staphylococcus aureus) infection 1992   history   Myocardial infarction ([place]) 1995   Sleep apnea    CPAP   Urothelial cancer ([place]) 11/2017   Left Urothelial mass, chemo tx's     Past Surgical History:  Procedure Laterality Date   CATARACT EXTRACTION Left    COLONOSCOPY  2010   [facility]   COLONOSCOPY WITH PROPOFOL N/A 10/04/2016   Procedure: COLONOSCOPY WITH PROPOFOL;  Surgeon: [name], MD;  Location: [facility];  Service: Endoscopy;  Laterality: N/A;   [place], 2000, 2001, 2014   CYSTOSCOPY W/ RETROGRADES Bilateral 12/10/2017   Procedure: CYSTOSCOPY WITH RETROGRADE PYELOGRAM;  Surgeon: [name], MD;  Location: [facility];  Service: Urology;  Laterality: Bilateral;   CYSTOSCOPY W/ RETROGRADES Left 12/09/2018   Procedure: CYSTOSCOPY WITH RETROGRADE PYELOGRAM;  Surgeon: [name], MD;  Location: [facility];  Service: Urology;  Laterality: Left;   CYSTOSCOPY WITH BIOPSY Left 12/09/2018   Procedure: CYSTOSCOPY WITH URETERAL/RENAL PELVIC BIOPSY;  Surgeon: [name], MD;  Location: [facility];  Service: Urology;  Laterality: Left;   CYSTOSCOPY WITH STENT PLACEMENT Left 12/10/2017   Procedure: CYSTOSCOPY WITH STENT PLACEMENT;  Surgeon: [name], MD;  Location: [facility];  Service: Urology;  Laterality: Left;   CYSTOSCOPY WITH STENT  PLACEMENT Left 12/09/2018   Procedure: CYSTOSCOPY WITH STENT PLACEMENT;  Surgeon: [name], MD;  Location: [facility];  Service: Urology;  Laterality: Left;   CYSTOSCOPY WITH URETEROSCOPY Left 12/09/2018   Procedure: CYSTOSCOPY WITH URETEROSCOPY;  Surgeon: [name], MD;  Location: [facility];  Service: Urology;  Laterality: Left;   EYE SURGERY Bilateral    cataract   INGUINAL HERNIA REPAIR Right 08/09/2015   Procedure: HERNIA REPAIR INGUINAL ADULT;  Surgeon: [name], MD;  Location: [facility];  Service: General;  Laterality: Right;   NASAL SINUS SURGERY     nuclear stress test     PORTA CATH INSERTION N/A 12/19/2017   Procedure: PORTA CATH INSERTION;  Surgeon: [name], MD;  Location: [facility];  Service: Cardiovascular;  Laterality: N/A;   URETERAL BIOPSY Left 12/10/2017   Procedure: URETERAL & renal PELVIS BIOPSY;  Surgeon: [name], MD;  Location: [facility];  Service: Urology;  Laterality: Left;   URETEROSCOPY Left 12/10/2017   Procedure: URETEROSCOPY;  Surgeon: [name], MD;  Location: [facility];  Service: Urology;  Laterality: Left;    Social History   Socioeconomic History   Marital status: Married    Spouse name: [name]   Number of children: 1   Years of education: Not on file   Highest education level: Associate degree: occupational, Hotel manager, or vocational program  Occupational History   Occupation: retired  Tobacco Use   Smoking status: Former Smoker    Types: Cigars    Quit  date: 10/09/1978    Years since quitting: 40.9   Smokeless tobacco: Former Systems developer    Types: Chew    Quit date: 12/04/1988   Tobacco comment: on occasion  Substance and Sexual Activity   Alcohol use: Not Currently   Drug use: No   Sexual activity: Not Currently  Other Topics Concern   Not on file  Social History Narrative   Not on file   Social Determinants of Health   Financial Resource Strain:    Difficulty of Paying Living Expenses:  Not on file  Food Insecurity:    Worried About Charity fundraiser in the Last Year: Not on file   [employer] of Food in the Last Year: Not on file  Transportation Needs:    Lack of Transportation (Medical): Not on file   Lack of Transportation (Non-Medical): Not on file  Physical Activity: Inactive   Days of Exercise per Week: 0 days   Minutes of Exercise per Session: 0 min  Stress: No Stress Concern Present   Feeling of Stress : Not at all  Social Connections: Unknown   Frequency of Communication with Friends and Family: Patient refused   Frequency of Social Gatherings with Friends and Family: Patient refused   Attends Religious Services: Patient refused   Marine scientist or Organizations: Patient refused   Attends Music therapist: Patient refused   Marital Status: Patient refused  Intimate Production manager Violence: Unknown   Fear of Current or Ex-Partner: Patient refused   Emotionally Abused: Patient refused   Physically Abused: Patient refused   Sexually Abused: Patient refused    Family History  Problem Relation Age of Onset   Heart disease Mother    Cancer Father        Lung and colon cancer   Heart disease Father    Emphysema Maternal Grandfather    Tuberculosis Maternal Grandmother      Current Outpatient Medications:    aspirin EC 81 MG tablet, Take 81 mg by mouth every evening. , Disp: , Rfl:    atorvastatin (LIPITOR) 40 MG tablet, TAKE 1 TABLET BY MOUTH AT BEDTIME, Disp: 90 tablet, Rfl: 4   Cholecalciferol (VITAMIN D3) 75 MCG (3000 UT) TABS, Take by mouth., Disp: , Rfl:    clobetasol cream (TEMOVATE) [id number] %, Apply 1 application topically as needed (for skin rash)., Disp: 30 g, Rfl: 1   fluticasone (FLONASE) 50 MCG/ACT nasal spray, Use 2 spray(s) in each nostril once daily, Disp: 48 g, Rfl: 3   gabapentin (NEURONTIN) 300 MG capsule, Take 1 capsule (300 mg total) by mouth 3 (three) times daily., Disp: 270 capsule, Rfl: 0    glucose blood (CONTOUR NEXT TEST) test strip, Check blood sugars 3 times daily., Disp: 300 each, Rfl: 11   HYDROcodone-acetaminophen (NORCO/VICODIN) 5-325 MG tablet, Take 1 tablet by mouth 1 day or 1 dose., Disp: , Rfl:    hydrOXYzine (ATARAX/VISTARIL) 25 MG tablet, Take 1 tablet (25 mg total) by mouth every 8 (eight) hours as needed for itching., Disp: 30 tablet, Rfl: 3   Ivermectin (SOOLANTRA) 1 % CREA, Apply 1 application topically daily as needed (rosacea). To face, Disp: , Rfl:    JARDIANCE 10 MG TABS tablet, Take 1 tablet by mouth once daily, Disp: 30 tablet, Rfl: 11   lidocaine-prilocaine (EMLA) cream, Apply 1 application topically as needed (port access)., Disp: 1 g, Rfl: 3   loratadine (CLARITIN) 10 MG tablet, Take 10 mg by mouth daily as needed for  allergies. [employer] brand allergy relief, Disp: , Rfl:    losartan (COZAAR) 50 MG tablet, Take 1 tablet (50 mg total) by mouth daily., Disp: 90 tablet, Rfl: 3   magic mouthwash w/lidocaine SOLN, Take 5 mLs by mouth 4 (four) times daily as needed for mouth pain., Disp: 480 mL, Rfl: 3   magnesium hydroxide (MILK OF MAGNESIA) 400 MG/5ML suspension, Take 15 mLs by mouth daily as needed for mild constipation., Disp: , Rfl:    metFORMIN (GLUCOPHAGE) 1000 MG tablet, TAKE 1 TABLET BY MOUTH TWICE DAILY WITH MEALS, Disp: 180 tablet, Rfl: 1   nystatin cream (MYCOSTATIN), Apply topically 2 (two) times daily., Disp: 15 g, Rfl: 1   OLANZapine (ZYPREXA) 10 MG tablet, Take 1 tablet (10 mg total) by mouth at bedtime., Disp: 30 tablet, Rfl: 1   OVER THE COUNTER MEDICATION, Apply 1 application topically daily as needed (pain). Outback topical pain relief, Disp: , Rfl:    oxybutynin (DITROPAN) 5 MG tablet, Take 1 tablet by mouth 1 day or 1 dose., Disp: , Rfl:    sertraline (ZOLOFT) 100 MG tablet, Take 1 tablet (100 mg total) by mouth daily., Disp: 90 tablet, Rfl: 3   vitamin B-12 (CYANOCOBALAMIN) 1000 MCG tablet, Take by mouth daily. Unsure dose,  Disp: , Rfl:    vitamin C (ASCORBIC ACID) 500 MG tablet, Take 1,000-1,500 mg by mouth daily as needed (immune support)., Disp: , Rfl:    VITAMIN D PO, Take 1 tablet by mouth 1 day or 1 dose., Disp: , Rfl:  No current facility-administered medications for this visit.  Facility-Administered Medications Ordered in Other Visits:    Influenza vac split quadrivalent PF (FLUZONE HIGH-DOSE) injection 0.5 mL, 0.5 mL, Intramuscular, Once, Karen Kitchens, NP  Physical exam:  Vitals:   09/09/19 1331  BP: 113/79  Pulse: 73  Temp: (!) 95.9 F (35.5 C)  TempSrc: Tympanic  Weight: 155 lb 8 oz (70.5 kg)   Physical Exam Constitutional:      General: He is not in acute distress. Cardiovascular:     Rate and Rhythm: Normal rate and regular rhythm.     Heart sounds: Normal heart sounds.  Pulmonary:     Effort: Pulmonary effort is normal.     Breath sounds: Normal breath sounds.  Abdominal:     General: Bowel sounds are normal.     Palpations: Abdomen is soft.  Skin:    General: Skin is warm and dry.  Neurological:     Mental Status: He is alert and oriented to person, place, and time.      CMP Latest Ref Rng & Units 09/09/2019  Glucose 70 - 99 mg/dL 169(H)  BUN 8 - 23 mg/dL 17  Creatinine 0.61 - 1.24 mg/dL 0.71  Sodium 135 - 145 mmol/L 136  Potassium 3.5 - 5.1 mmol/L 4.2  Chloride 98 - 111 mmol/L 101  CO2 22 - 32 mmol/L 25  Calcium 8.9 - 10.3 mg/dL 9.6  Total Protein 6.5 - 8.1 g/dL 6.9  Total Bilirubin 0.3 - 1.2 mg/dL 0.9  Alkaline Phos 38 - 126 U/L 81  AST 15 - 41 U/L 20  ALT 0 - 44 U/L 13   CBC Latest Ref Rng & Units 09/09/2019  WBC 4.0 - 10.5 K/uL 7.7  Hemoglobin 13.0 - 17.0 g/dL 12.5(L)  Hematocrit 39.0 - 52.0 % 39.0  Platelets 150 - 400 K/uL 187    Assessment and plan- Patient is a 74 y.o. male with history of metastatic upper urothelial  carcinoma with metastases to the lymph nodes.    He is here for on treatment assessment prior to next cycle of Padcev  Counts are okay to  proceed with next cycle of Padcev today.  We will continue 1 week on and 3 weeks off regimen for him keeping in mind his quality of life and desire to take fewer treatments.  His volume of disease based on his CT scan in December 2020 was minimal.  I will plan to get repeat scan sometime in May 2021.  Chemo-induced peripheral neuropathy: He previously had foot drop which is improving and is continuing with physical therapy.  Overall symptoms are stable and he will continue gabapentin   Visit Diagnosis 1. Encounter for antineoplastic chemotherapy   2. Urothelial carcinoma of kidney, left ([place])   3. Chemotherapy-induced peripheral neuropathy ([facility])      Dr. Randa Evens, MD, MPH [facility] [id number] 09/10/2019 5:59 PM

## 2019-09-10 NOTE — Therapy (Signed)
[place] MAIN [facility] SERVICES [address] [place], [place], [address] Phone: [phone]   Fax:  [phone]  Physical Therapy Treatment  Patient Details  Name: Patrick Mcgee MRN: [id number] Date of Birth: 13-Feb-1946 Referring Provider (PT): Dr. Rosanna Randy   Encounter Date: 09/10/2019  PT End of Session - 09/10/19 1308    Visit Number  24    Number of Visits  49    Date for PT Re-Evaluation  11/19/19    PT Start Time  7616    PT Stop Time  1345    PT Time Calculation (min)  43 min    Equipment Utilized During Treatment  Gait belt    Activity Tolerance  Patient tolerated treatment well;No increased pain    Behavior During Therapy  WFL for tasks assessed/performed       Past Medical History:  Diagnosis Date  . Acid reflux   . Anxiety   . Depression   . Diabetes mellitus without complication ([place])   . Hyperlipidemia   . Hypertension   . MRSA (methicillin resistant Staphylococcus aureus) infection 1992   history  . Myocardial infarction ([place]) 1995  . Sleep apnea    CPAP  . Urothelial cancer ([place]) 11/2017   Left Urothelial mass, chemo tx's    Past Surgical History:  Procedure Laterality Date  . CATARACT EXTRACTION Left   . COLONOSCOPY  2010   [facility]  . COLONOSCOPY WITH PROPOFOL N/A 10/04/2016   Procedure: COLONOSCOPY WITH PROPOFOL;  Surgeon: [name], MD;  Location: [facility] ENDOSCOPY;  Service: Endoscopy;  Laterality: N/A;  . [place], 2000, 2001, 2014  . CYSTOSCOPY W/ RETROGRADES Bilateral 12/10/2017   Procedure: CYSTOSCOPY WITH RETROGRADE PYELOGRAM;  Surgeon: [name], MD;  Location: [facility] ORS;  Service: Urology;  Laterality: Bilateral;  . CYSTOSCOPY W/ RETROGRADES Left 12/09/2018   Procedure: CYSTOSCOPY WITH RETROGRADE PYELOGRAM;  Surgeon: [name], MD;  Location: [facility] ORS;  Service: Urology;  Laterality: Left;  . CYSTOSCOPY WITH BIOPSY Left 12/09/2018   Procedure:  CYSTOSCOPY WITH URETERAL/RENAL PELVIC BIOPSY;  Surgeon: [name], MD;  Location: [facility] ORS;  Service: Urology;  Laterality: Left;  . CYSTOSCOPY WITH STENT PLACEMENT Left 12/10/2017   Procedure: CYSTOSCOPY WITH STENT PLACEMENT;  Surgeon: [name], MD;  Location: [facility] ORS;  Service: Urology;  Laterality: Left;  . CYSTOSCOPY WITH STENT PLACEMENT Left 12/09/2018   Procedure: CYSTOSCOPY WITH STENT PLACEMENT;  Surgeon: [name], MD;  Location: [facility] ORS;  Service: Urology;  Laterality: Left;  . CYSTOSCOPY WITH URETEROSCOPY Left 12/09/2018   Procedure: CYSTOSCOPY WITH URETEROSCOPY;  Surgeon: [name], MD;  Location: [facility] ORS;  Service: Urology;  Laterality: Left;  . EYE SURGERY Bilateral    cataract  . INGUINAL HERNIA REPAIR Right 08/09/2015   Procedure: HERNIA REPAIR INGUINAL ADULT;  Surgeon: [name], MD;  Location: [facility] ORS;  Service: General;  Laterality: Right;  . NASAL SINUS SURGERY    . nuclear stress test    . PORTA CATH INSERTION N/A 12/19/2017   Procedure: PORTA CATH INSERTION;  Surgeon: [name], MD;  Location: [place] CV LAB;  Service: Cardiovascular;  Laterality: N/A;  . URETERAL BIOPSY Left 12/10/2017   Procedure: URETERAL & renal PELVIS BIOPSY;  Surgeon: [name], MD;  Location: [facility] ORS;  Service: Urology;  Laterality: Left;  . URETEROSCOPY Left 12/10/2017   Procedure: URETEROSCOPY;  Surgeon: [name], MD;  Location: [facility] ORS;  Service: Urology;  Laterality: Left;    There were no vitals filed for this visit.  Subjective Assessment - 09/10/19 1306    Subjective  Pt reports doing ok today. Denies any pain upon arrival and no falls since last therapy session. He restarted his chemo treatments yesterday which is his first treatment since early February. Pt reports that he is changing his treatments to once/month.    Pertinent History  Part of history borrowed from oncology note and verified with patient: Patient is a 74 year old male who  was referred for foot drop. He started with Dr. [name] so far for his metastatic urothelial carcinoma.  This was originally diagnosed in May 2019. He was started on chemotherapy in June 2019 and was continued on 05/27/2018 for 8 cycles.  He tolerated chemotherapy well except for chemo-induced anemia for which she has been getting Procrit every 2 weeks.  He did have response to his disease based on scans in August 2019.  However repeat scan on 05/31/2018 showed increase in the size of the primary tumor from 1.2 to 1.9 cm and increase in left para-aortic adenopathy from 0.9 to 1.3 cm.  Second line immunotherapy was recommended.  His initial biopsy specimen did not have enough sample to undergo FGFR mutation testing. He also has mild interstitial lung disease for which he sees pulmonary but he is not on home oxygen.  He has B12 deficiency for which he is on oral B12.  Also has diabetes and coronary artery disease. Patient noted to have disease progression in his lymph nodes in May 2020.  Repeat biopsy showed metastatic urothelial carcinoma which did not have a FGFR mutation.  He has been started on third line Padcev. There is still times when he feels fatigued but he is having more good days. He reports poor appetite. He was working with physical therapy on his foot drop, RLE weakness, and unsteadiness. He states that he gets considerable numbness for the 7-8 days following his chemotherapy treatment. He states that he is a diabetic and did have some numbness prior to starting chemotherapy. He states that he has a history of a low back disc bulge with RLE radicular symptoms. He used to get spinal injections but reports he hasn't had any injections in the last 10 years.    Currently in Pain?  No/denies         TREATMENT   Ther-ex Octane L5-7 x 5 minutes total for warm-up during history (3 minutes unbilled); Precor BLE leg press 100# 2 x 20; Precor BLE heel raises 55# x 20; STS from chair with 3kg med  ball overhead press 2 x 10; Overhead 2kg med ball slams with catch 2 x 10;  Seated ankle DF, 5lb AW across foot 2x10 bilateral;  Seated marching with 6# ankle weights x 20 each; Seated LAQ with6# ankle weights x 20 each; Standing hip extension, hip abduction, and HS curls with6# ankle weights x 20 each;    Neuromuscular Re-education  Dix-Hallpike and roll tests performed which are negative bilaterally; Airex 2kg med ball vertical toss to self x 10; Airex NBOS eyes closed x 30s; Airex 2kg med ball pass around body with head/eye follow x multiple bouts each direction;   Pt educated throughout session about proper posture and technique with exercises. Improved exercise technique, movement at target joints, use of target muscles after min to mod verbal, visual, tactile cues.    Ptdemonstrates excellent motivation during session today. He is able to increase the resistance on the leg press  today. Increased ankle weights to 6# and performed sit to stands for endurance again today with an overhead med ball press. Pt reports some occasional dizziness and wanted to be tested for vertigo. All BPPV tests are negative today.Pt encouraged to continue HEP and follow-up as scheduled. Hewould benefit from further skilled PT intervention to maximize safety, mobility, and independence.                     PT Short Term Goals - 08/27/19 1307      PT SHORT TERM GOAL #1   Title  Pt will be independent with HEP in order to improve strength and balance in order to decrease fall risk and improve function at home    Time  6    Period  Weeks    Status  On-going    Target Date  07/14/19        PT Long Term Goals - 08/27/19 1307      PT LONG TERM GOAL #1   Title  Pt will improve BERG by at least 3 points in order to demonstrate clinically significant improvement in balance.    Baseline  06/02/19: 48/56; 07/08/19: 54/56; 08/27/19: 53/56    Time  12    Period  Weeks    Status   Achieved      PT LONG TERM GOAL #2   Title  Pt will improve ABC by at least 13% in order to demonstrate clinically significant improvement in balance confidence.    Baseline  06/02/19: 64.4%; 07/07/19: 65%; 08/27/19: 65%    Time  12    Period  Weeks    Status  On-going    Target Date  11/19/19      PT LONG TERM GOAL #3   Title  Pt will increase 30s Sit to Stand Test to greater than or equal to 14 repetitions in order to demonstrate clinically significant improvement in LE endurance and decrease in fall risk.    Baseline  06/02/19: 10 repetitions; 07/07/19: 13 repetitions; 08/27/19: 14.5 repetitions    Time  12    Period  Weeks    Status  Achieved      PT LONG TERM GOAL #4   Title  Pt will increase 6MWT by at least 182m(3252f in order to demonstrate clinically significant improvement in cardiopulmonary endurance and community ambulation    Baseline  06/10/20: 137536fno AD, no LOB, Rt foot slap without flat foot strike; 07/07/19: test terminated 2/2 fall; 07/09/19: 1565' with R AFO, no assistive device, mask donned; 08/27/19: 1615' with R AFO, no assistive, mask donned    Time  12    Period  Weeks    Status  Partially Met    Target Date  11/19/19      PT LONG TERM GOAL #5   Title  Pt will improve FGA by at least 4 points in order to demonstrate clinically significant improvement in balance and decreased risk for falls.    Baseline  08/27/19; 25/30    Time  12    Period  Weeks    Status  New    Target Date  11/19/19            Plan - 09/10/19 1308    Clinical Impression Statement  Pt demonstrates excellent motivation during session today.  He is able to increase the resistance on the leg press today. Increased ankle weights to 6# and performed sit to stands for endurance again today  with an overhead med ball press. Pt reports some occasional dizziness and wanted to be tested for vertigo. All BPPV tests are negative today. Pt encouraged to continue HEP and follow-up as scheduled.  He would benefit from further skilled PT intervention to maximize safety, mobility, and independence.    Personal Factors and Comorbidities  Age;Comorbidity 3+;Time since onset of injury/illness/exacerbation    Comorbidities  Urothelial cancer, DM, previous lumbar disc bulge, interstitial pulmonary disease    Examination-Activity Limitations  Locomotion Level;Stairs;Transfers    Examination-Participation Restrictions  Community Activity;Laundry;Shop    Stability/Clinical Decision Making  Evolving/Moderate complexity    Clinical Decision Making  Moderate    Rehab Potential  Fair    PT Frequency  2x / week    PT Duration  12 weeks    PT Treatment/Interventions  ADLs/Self Care Home Management;Aquatic Therapy;Biofeedback;Canalith Repostioning;Cryotherapy;Iontophoresis '4mg'$ /ml Dexamethasone;Moist Heat;Traction;Ultrasound;DME Instruction;Gait training;Stair training;Functional mobility training;Therapeutic activities;Therapeutic exercise;Balance training;Neuromuscular re-education;Patient/family education;Orthotic Fit/Training;Manual techniques;Dry needling;Energy conservation;Vestibular;Joint Manipulations    PT Next Visit Plan  Continue with strengthening and balance    PT Home Exercise Plan  Access Code: [email or web address]    Consulted and Agree with Plan of Care  Patient       Patient will benefit from skilled therapeutic intervention in order to improve the following deficits and impairments:  Abnormal gait, Decreased activity tolerance, Decreased balance, Decreased strength  Visit Diagnosis: Muscle weakness (generalized)  Unsteadiness on feet     Problem List Patient Active Problem List   Diagnosis Date Noted  . Chest pain 03/04/2019  . Anemia due to antineoplastic chemotherapy 03/03/2018  . Cough 03/03/2018  . Chemotherapy induced neutropenia ([place]) 01/22/2018  . B12 deficiency 01/01/2018  . Hypomagnesemia 12/21/2017  . Encounter for antineoplastic chemotherapy 12/21/2017  . Malignant  neoplasm of kidney ([place])   . Urothelial cancer ([place]) 12/18/2017  . Malnutrition of moderate degree 12/18/2017  . Therapeutic opioid induced constipation   . Palliative care encounter   . Dehydration 12/17/2017  . Goals of care, counseling/discussion 12/13/2017  . Urothelial carcinoma of kidney, left ([place]) 12/07/2017  . Right inguinal hernia 07/17/2015  . Family history of colon cancer 07/17/2015  . Allergic rhinitis 11/12/2014  . Airway hyperreactivity 11/12/2014  . Atherosclerosis of coronary artery 11/12/2014  . Cheilitis 11/12/2014  . Narrowing of intervertebral disc space 11/12/2014  . Deflected nasal septum 11/12/2014  . HLD (hyperlipidemia) 11/12/2014  . BP (high blood pressure) 11/12/2014  . Adult hypothyroidism 11/12/2014  . Sleep apnea 11/12/2014  . Diabetes mellitus, type 2 ([place]) 11/12/2014  . Degenerative disc disease, lumbar 09/24/2012  . Furunculosis 04/23/2012   Phillips Grout PT, DPT, GCS  Huprich,Jason 09/10/2019, 2:03 PM  [place] MAIN [facility] SERVICES [address] [place], [place], [address] Phone: [phone]   Fax:  [phone]  Name: HYMIE GORR MRN: [id number] Date of Birth: 03-04-1946

## 2019-09-11 ENCOUNTER — Other Ambulatory Visit: Payer: Medicare Other

## 2019-09-11 ENCOUNTER — Other Ambulatory Visit: Payer: Self-pay

## 2019-09-11 DIAGNOSIS — Z515 Encounter for palliative care: Secondary | ICD-10-CM

## 2019-09-11 NOTE — Progress Notes (Signed)
PATIENT NAME: Patrick Mcgee DOB: 1945/08/28 MRN: [id number]  PRIMARY CARE PROVIDER: Jerrol Banana., MD  RESPONSIBLE PARTY:  Acct ID - Guarantor Home Phone Work Phone Relationship Acct Type  [id number] Patrick Mcgee [phone]  Self P/F     [place], [place], [place] [address]    PLAN OF CARE and INTERVENTIONS:               1.  GOALS OF CARE/ ADVANCE CARE PLANNING:  Remain in home with wife and continue receiving chemo.               2.  PATIENT/CAREGIVER EDUCATION:  Education on fall precautions due to neuropathy, education on side effects of chemo, reviewed meds, support               3.  DISEASE STATUS: Due to the COVID-19 crisis, this visit was done via telephonic from my office and it was initiated and consent by this patient and or family.   SW and RN made scheduled telephonic visit to patient. Patient reports he had chemo yesterday and feels like he has chemo brain today. Patient is slightly confused today. Patients wife not at home with patient today.  Patient states he and wife have received CoVid 19 vaccines are are going getting out more.  Patient reports he has been receiving chemo monthly and is scheduled to have a scan to see how he is doing in May. Patient has chemo-induced anemia and receives Procrit injections every 2 weeks. Patient continues to receive PT for strengenthing and foot drop.  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 Patient reports PT has ordered him a special insert to go in his shoe to help him with walking. Patient states he cannot walk heel-to-toe. Patient also states he is going to start receiving acupuncture again to help with neuropathy. Patient reports his current weight is 155.8 lbs. Patient reports he has lost a couple of pounds. Patient reports he feels he is eating better and feels he will gain weight back. Patient denies having any shortness of breath or cough. Patient denies having any nausea or vomiting. Patient  reports he sleeps well and continues to stay up late at night and sleep late during the day. Patient taking gabapentin 300 mg twice a day for neuropathy. Patient reports if neuropathy is bad he may take extra capsule of gabapentin.  Patient reports his cozaar was decreased from 100 mg to 50 mg. Nurse reviewed medications with patient. Patient remains in agreement with palliative care services. Patient in agreement with palliative care team making home visit next month. Patient encouraged to contact palliative care with questions or concerns.   HISTORY OF PRESENT ILLNESS:  Patient is a 74 year old patient who resides in home with wife Patrick Mcgee.  Patient receiving chemo monthly.  Patient being followed by palliative care team and is seen monthly and PRN.     CODE STATUS: DNR  ADVANCED DIRECTIVES: Y MOST FORM: No PPS: 50%   PHYSICAL EXAM:   VITALS: Today's Vitals   09/11/19 1533  Weight: 155 lb 12.8 oz (70.7 kg)  PainSc: 0-No pain    LUNGS: Patient denies having any cough or shortness of breath. CARDIAC:  EXTREMITIES: patient denies having any edema.   SKIN: Patient denies having any open areas of skin breakdown  NEURO: Alert and oriented x 4       Nilda Simmer, RN

## 2019-09-11 NOTE — Progress Notes (Signed)
COMMUNITY PALLIATIVE CARE SW NOTE  PATIENT NAME: Patrick Mcgee DOB: 1945-10-11 MRN: [id number]  PRIMARY CARE PROVIDER: Jerrol Mcgee., MD  RESPONSIBLE PARTY:  Acct ID - Guarantor Home Phone Work Phone Relationship Acct Type  [id number] Patrick Mcgee [phone]  Self P/F     [place], [place], [place] [address]     PLAN OF CARE and INTERVENTIONS:             1. GOALS OF CARE/ ADVANCE CARE PLANNING:  Patientis DNR, form is in the home.HCPOA is Patrick Mcgee (patient's daughter).Patient's goal is to maintain his quality of life and independence. 2. SOCIAL/EMOTIONAL/SPIRITUAL ASSESSMENT/ INTERVENTIONS:  SW and RN completed TELEHEALTH visit with patient. Patient denies pain, continues to experience neuropathy. Patient continues to struggle with his imbalance and per patient this can be frustration. Patient reports that his appetite is fair. Patrick Mcgee (patient's wife) tries to help with meals and encourage him with his diet. Patient said he is sleeping more often, time still varies. Patient is trying to adjust his sleep schedule. Patient is trying to get out of the house more. Patient has received his COVID-19 vaccines. SW provided emotional support, discussed care and goals, and used active and reflective listening. 3. PATIENT/CAREGIVER EDUCATION/ COPING:  Patient is alert, engaged. Patient said he has "chemo brain" sometimes. Patient denies coping concerns. Patient openly shares his feelings. Patient copes with prayer, has strong [place] faith. Patient has good family support. 4. PERSONAL EMERGENCY PLAN:  Patient/family will call 9-1-1 for emergencies.  5. COMMUNITY RESOURCES COORDINATION/ HEALTH CARE NAVIGATION:  Patient and Patrick Mcgee coordinate care. Patient is now receiving chemotherapy once per month. Patient continues to go to outpatient PT twice a week. Patient is going to schedule acupuncture again, as this helps with his neuropathy. 6. FINANCIAL/LEGAL CONCERNS/INTERVENTIONS:  None.      SOCIAL HX:  Social History   Tobacco Use  . Smoking status: Former Smoker    Types: Cigars    Quit date: 10/09/1978    Years since quitting: 40.9  . Smokeless tobacco: Former Systems developer    Types: Chew    Quit date: 12/04/1988  . Tobacco comment: on occasion  Substance Use Topics  . Alcohol use: Not Currently    CODE STATUS:   Code Status: Prior (DNR) ADVANCED DIRECTIVES: Y MOST FORM COMPLETE:  No. HOSPICE EDUCATION PROVIDED: None.  PPS: Patient is independent of ADLs.Patient is ambulating without assistance but is unsteady at times. Patient continues weekly PToutpatient.  Due to the COVID-19 , this visit was done via telephone from my office and it was initiated and consent by this patient and/or family. This was a scheduled visit.  I spent72minutes with patient/family, from3:30-4:00pproviding education, support and consultation.  Patrick Lombard, LCSW

## 2019-09-16 IMAGING — CT CT CHEST W/O CM
2 of 3 series · 15 of 36 positions shown, 18 images · non-contrast
Comparison: Abdominopelvic CT 12/01/2017. Chest radiograph
09/06/2011. No prior chest CT.

CLINICAL DATA: Possible urothelial carcinoma of the left kidney
with abdominopelvic nodal metastasis.

EXAM:
CT CHEST WITHOUT CONTRAST
TECHNIQUE: Multidetector CT imaging of the chest was performed following the
standard protocol without IV contrast.

[Series 2: thorax · axial · 0.71mm/px · z∈[-529,-265]mm · 12 of 156 slices shown, 15 images]
[im 12/156  mediastinal]
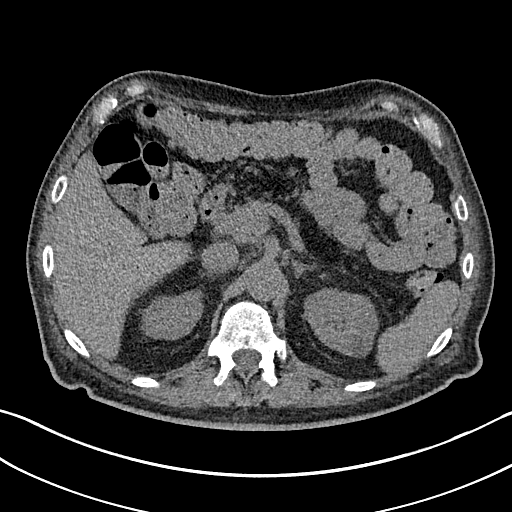
[im 12/156  lung]
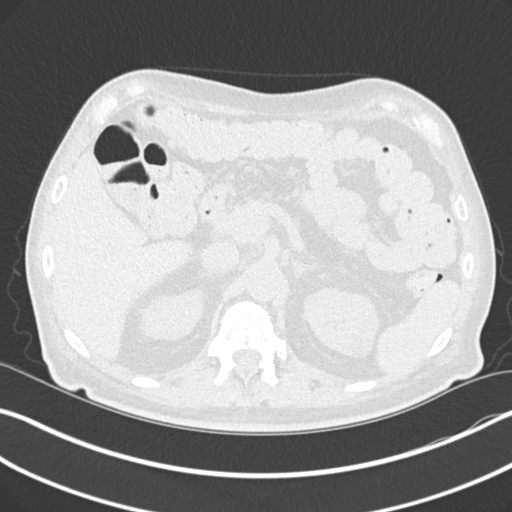
[im 23/156  lung]
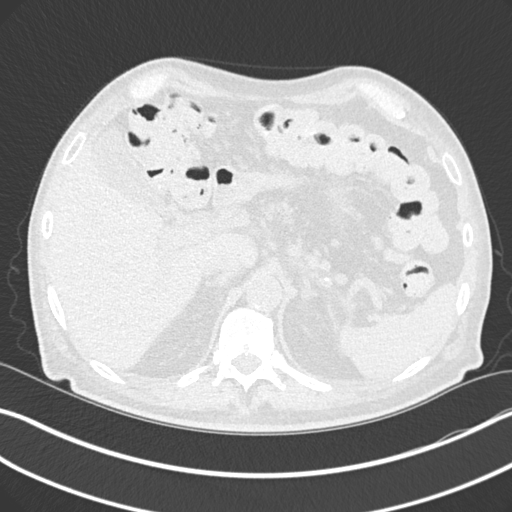
[im 35/156  lung]
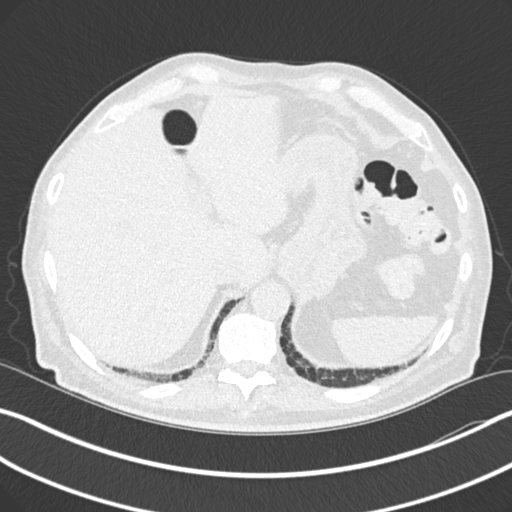
[im 46/156  lung]
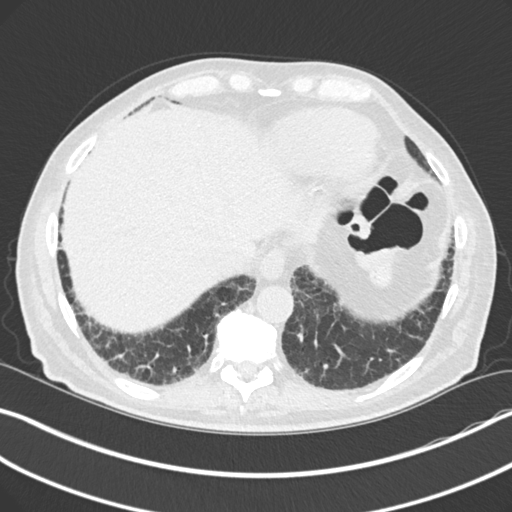
[im 58/156  mediastinal]
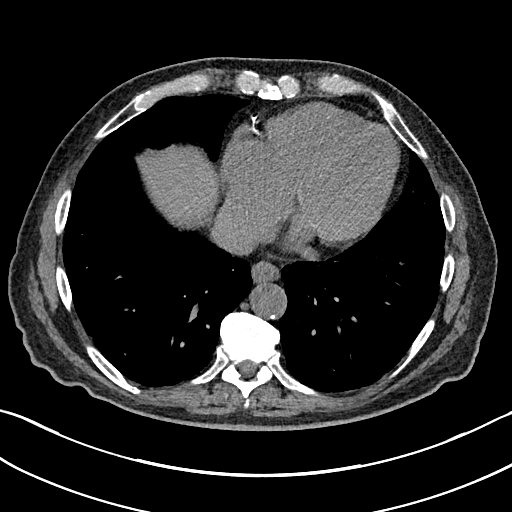
[im 58/156  lung]
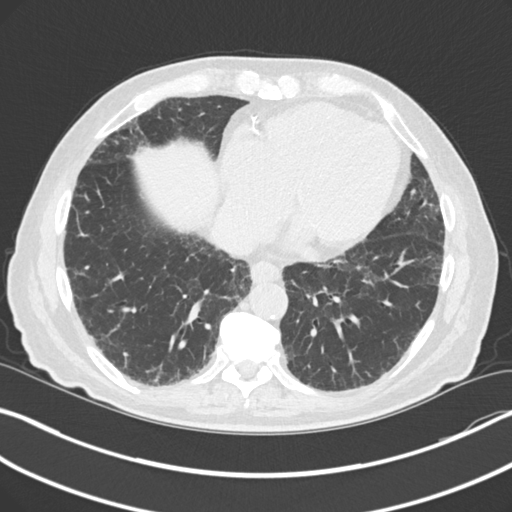
[im 69/156  lung]
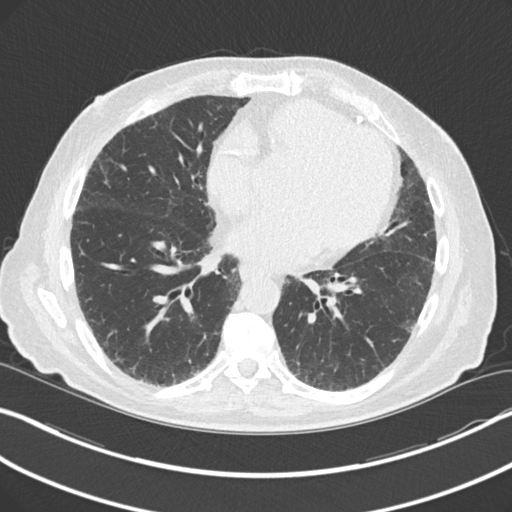
[im 87/156  lung]
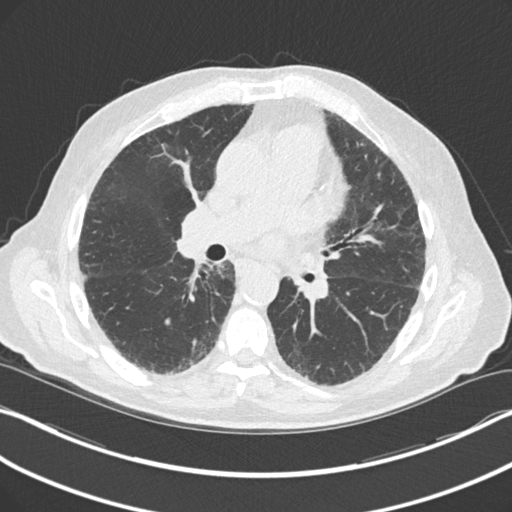
[im 98/156  lung]
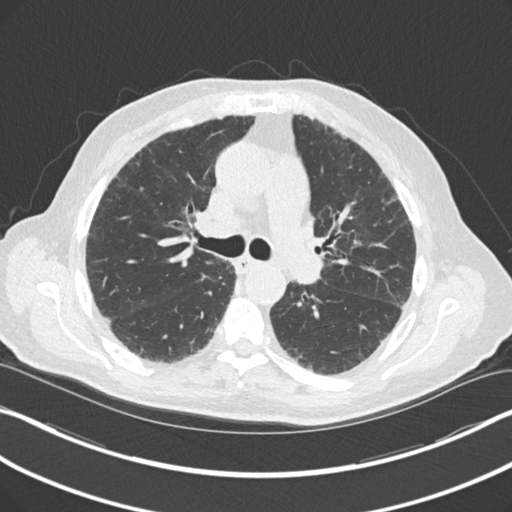
[im 110/156  mediastinal]
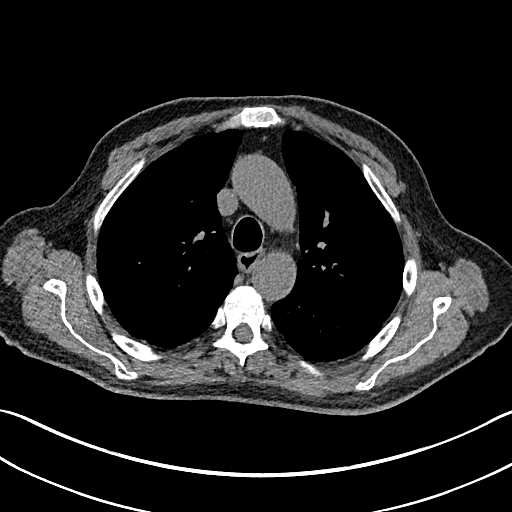
[im 110/156  lung]
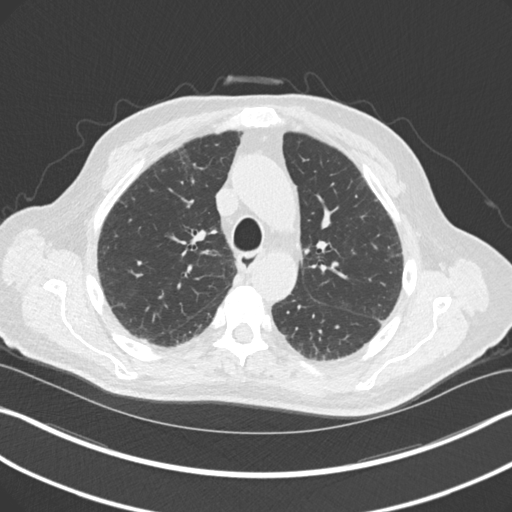
[im 121/156  lung]
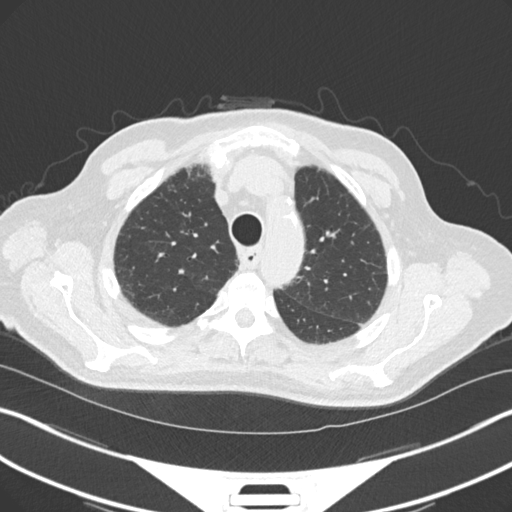
[im 133/156  lung]
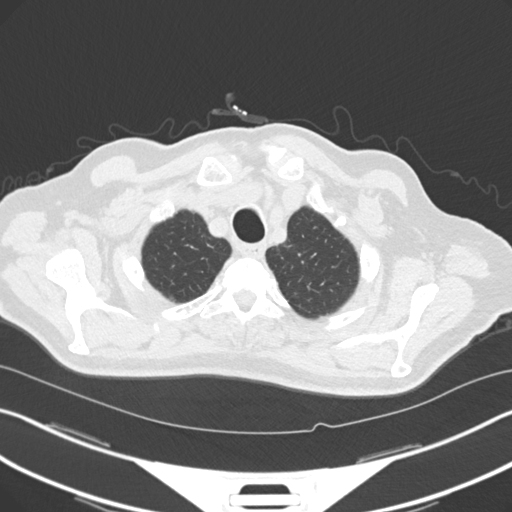
[im 144/156  lung]
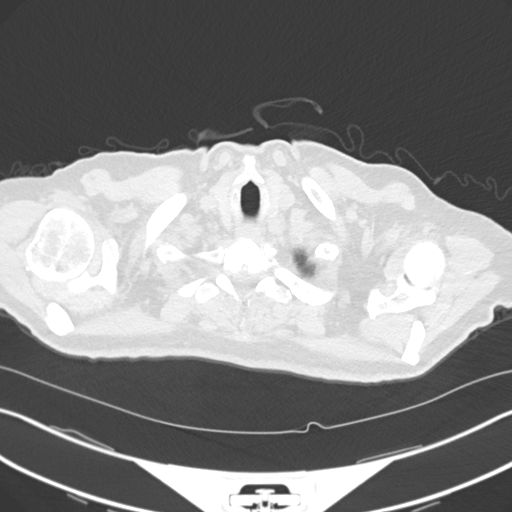

[Series 5: coronal · coronal · 0.63mm/px · 3 of 132 slices shown]
[im 27/132  lung]
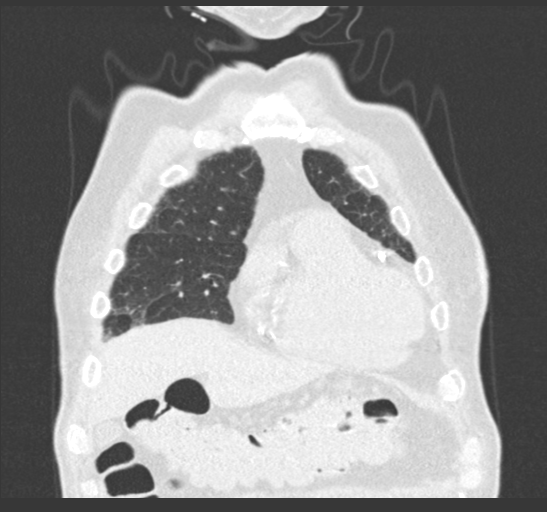
[im 53/132  lung]
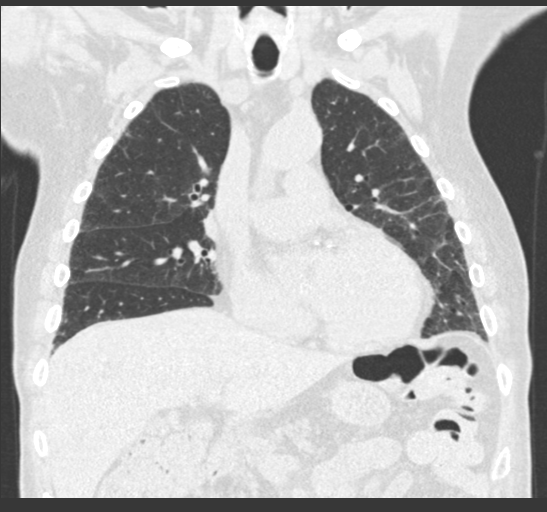
[im 79/132  lung]
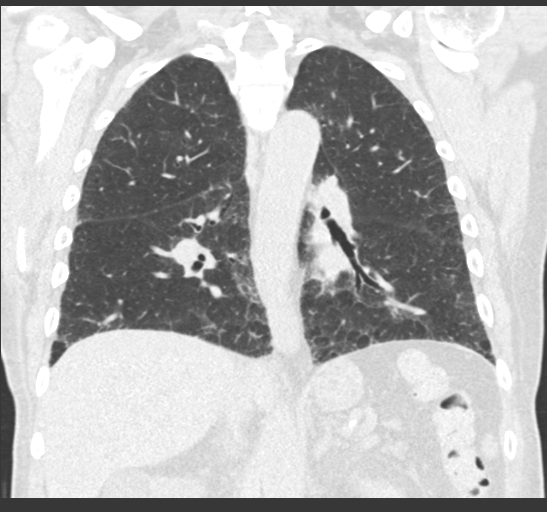

[15 of 36 positions shown; findings below may reference images not displayed]

FINDINGS: Cardiovascular: Aortic and branch vessel atherosclerosis. Tortuous
thoracic aorta. Mild cardiomegaly, without pericardial effusion.
Multivessel coronary artery atherosclerosis. Pulmonary artery
enlargement, outflow tract 3.4 cm.

Mediastinum/Nodes: No supraclavicular adenopathy. No mediastinal or
definite hilar adenopathy, given limitations of unenhanced CT.

Lungs/Pleura: No pleural fluid. No pleural fluid. Mild centrilobular
emphysema. No suspicious pulmonary nodule or mass. Subpleural
reticulation is relatively diffuse with possible mild craniocaudal
gradient. Mild areas of upper lobe ground-glass. No honeycombing.

Upper Abdomen: Normal imaged portions of the liver, spleen, stomach,
gallbladder, adrenal glands. Low-density bilateral renal lesions are
incompletely imaged.

Musculoskeletal: Mild thoracic spondylosis.
IMPRESSION: 1.  No acute process or evidence of metastatic disease in the chest.
2. Interstitial lung disease as evidenced by subpleural reticulation
but no honeycombing. This could represent nonspecific interstitial
pneumonitis or early usual interstitial pneumonitis.
3. Aortic atherosclerosis (FH8O9-PV1.1), coronary artery
atherosclerosis and emphysema (FH8O9-3HG.L).
4. Pulmonary artery enlargement suggests pulmonary arterial
hypertension.

## 2019-09-17 ENCOUNTER — Ambulatory Visit: Payer: Medicare Other | Admitting: Physical Therapy

## 2019-09-17 ENCOUNTER — Encounter: Payer: Self-pay | Admitting: Physical Therapy

## 2019-09-17 ENCOUNTER — Other Ambulatory Visit: Payer: Self-pay

## 2019-09-17 DIAGNOSIS — R2681 Unsteadiness on feet: Secondary | ICD-10-CM | POA: Diagnosis not present

## 2019-09-17 DIAGNOSIS — M6281 Muscle weakness (generalized): Secondary | ICD-10-CM

## 2019-09-17 NOTE — Therapy (Signed)
[place] MAIN [facility] SERVICES [address] [place], [place], [address] Phone: [phone]   Fax:  [phone]  Physical Therapy Treatment  Patient Details  Name: Patrick Mcgee MRN: [id number] Date of Birth: 01/25/1946 Referring Provider (PT): Dr. Rosanna Randy   Encounter Date: 09/17/2019  PT End of Session - 09/17/19 0858    Visit Number  25    Number of Visits  49    Date for PT Re-Evaluation  11/19/19    PT Start Time  0945    PT Stop Time  1025    PT Time Calculation (min)  40 min    Equipment Utilized During Treatment  Gait belt    Activity Tolerance  Patient tolerated treatment well;No increased pain    Behavior During Therapy  WFL for tasks assessed/performed       Past Medical History:  Diagnosis Date  . Acid reflux   . Anxiety   . Depression   . Diabetes mellitus without complication ([place])   . Hyperlipidemia   . Hypertension   . MRSA (methicillin resistant Staphylococcus aureus) infection 1992   history  . Myocardial infarction ([place]) 1995  . Sleep apnea    CPAP  . Urothelial cancer ([place]) 11/2017   Left Urothelial mass, chemo tx's    Past Surgical History:  Procedure Laterality Date  . CATARACT EXTRACTION Left   . COLONOSCOPY  2010   [facility]  . COLONOSCOPY WITH PROPOFOL N/A 10/04/2016   Procedure: COLONOSCOPY WITH PROPOFOL;  Surgeon: [name], MD;  Location: [facility];  Service: Endoscopy;  Laterality: N/A;  . [place], 2000, 2001, 2014  . CYSTOSCOPY W/ RETROGRADES Bilateral 12/10/2017   Procedure: CYSTOSCOPY WITH RETROGRADE PYELOGRAM;  Surgeon: [name], MD;  Location: [facility] ORS;  Service: Urology;  Laterality: Bilateral;  . CYSTOSCOPY W/ RETROGRADES Left 12/09/2018   Procedure: CYSTOSCOPY WITH RETROGRADE PYELOGRAM;  Surgeon: [name], MD;  Location: [facility] ORS;  Service: Urology;  Laterality: Left;  . CYSTOSCOPY WITH BIOPSY Left 12/09/2018   Procedure:  CYSTOSCOPY WITH URETERAL/RENAL PELVIC BIOPSY;  Surgeon: [name], MD;  Location: [facility] ORS;  Service: Urology;  Laterality: Left;  . CYSTOSCOPY WITH STENT PLACEMENT Left 12/10/2017   Procedure: CYSTOSCOPY WITH STENT PLACEMENT;  Surgeon: [name], MD;  Location: [facility] ORS;  Service: Urology;  Laterality: Left;  . CYSTOSCOPY WITH STENT PLACEMENT Left 12/09/2018   Procedure: CYSTOSCOPY WITH STENT PLACEMENT;  Surgeon: [name], MD;  Location: [facility] ORS;  Service: Urology;  Laterality: Left;  . CYSTOSCOPY WITH URETEROSCOPY Left 12/09/2018   Procedure: CYSTOSCOPY WITH URETEROSCOPY;  Surgeon: [name], MD;  Location: [facility] ORS;  Service: Urology;  Laterality: Left;  . EYE SURGERY Bilateral    cataract  . INGUINAL HERNIA REPAIR Right 08/09/2015   Procedure: HERNIA REPAIR INGUINAL ADULT;  Surgeon: [name], MD;  Location: [facility] ORS;  Service: General;  Laterality: Right;  . NASAL SINUS SURGERY    . nuclear stress test    . PORTA CATH INSERTION N/A 12/19/2017   Procedure: PORTA CATH INSERTION;  Surgeon: [name], MD;  Location: [place] CV LAB;  Service: Cardiovascular;  Laterality: N/A;  . URETERAL BIOPSY Left 12/10/2017   Procedure: URETERAL & renal PELVIS BIOPSY;  Surgeon: [name], MD;  Location: [facility] ORS;  Service: Urology;  Laterality: Left;  . URETEROSCOPY Left 12/10/2017   Procedure: URETEROSCOPY;  Surgeon: [name], MD;  Location: [facility] ORS;  Service: Urology;  Laterality: Left;    There were no vitals filed for this visit.  Subjective Assessment - 09/17/19 0855    Subjective  Pt reports doing ok today. Denies any pain upon arrival and no falls since last therapy session. Pt reports having numbness in B feet and hands.    Pertinent History  Part of history borrowed from oncology note and verified with patient: Patient is a 74 year old male who was referred for foot drop. He started with Dr. [name] so far for his metastatic urothelial carcinoma.   This was originally diagnosed in May 2019. He was started on chemotherapy in June 2019 and was continued on 05/27/2018 for 8 cycles.  He tolerated chemotherapy well except for chemo-induced anemia for which she has been getting Procrit every 2 weeks.  He did have response to his disease based on scans in August 2019.  However repeat scan on 05/31/2018 showed increase in the size of the primary tumor from 1.2 to 1.9 cm and increase in left para-aortic adenopathy from 0.9 to 1.3 cm.  Second line immunotherapy was recommended.  His initial biopsy specimen did not have enough sample to undergo FGFR mutation testing. He also has mild interstitial lung disease for which he sees pulmonary but he is not on home oxygen.  He has B12 deficiency for which he is on oral B12.  Also has diabetes and coronary artery disease. Patient noted to have disease progression in his lymph nodes in May 2020.  Repeat biopsy showed metastatic urothelial carcinoma which did not have a FGFR mutation.  He has been started on third line Padcev. There is still times when he feels fatigued but he is having more good days. He reports poor appetite. He was working with physical therapy on his foot drop, RLE weakness, and unsteadiness. He states that he gets considerable numbness for the 7-8 days following his chemotherapy treatment. He states that he is a diabetic and did have some numbness prior to starting chemotherapy. He states that he has a history of a low back disc bulge with RLE radicular symptoms. He used to get spinal injections but reports he hasn't had any injections in the last 10 years.    Limitations  Walking    Currently in Pain?  No/denies    Pain Score  0-No pain       Neuromuscular Re-education   Tandem stand on 1/2 foam without UE support x 2 lengths Side stepping on 2"x4" without UE support x 2 lengths Heel/toe raises without UE support 3s hold x 10 each 1/2 foam roll balance with flat side up 30s x 2 reps 1/2 foam  roll balance with flat side down 30s x 2 reps 1/2 foam roll tandem balance alternating forward LE 30s x 2 each LE forward Lateral side steps from foam to 6 inch stool left and right x 15 Backwards stepping from foam to 6 inch stool x 15         Pt educated throughout session about proper posture and technique with exercises. Improved exercise technique, movement at target joints, use of target muscles after min to mod verbal, visual, tactile cues. CGA and Min to mod verbal cues used throughout with increased in postural sway and LOB most seen with narrow base of support and while on uneven surfaces. Continues to have balance deficits typical with diagnosis. Patient performs intermediate level exercises without pain behaviors and needs verbal cuing for postural alignment and head positioning Tactile cues and assistance needed to keep lower  leg and knee in neutral to avoid compensations with ankle motions.                        PT Education - 09/17/19 0857    Education Details  HEP    Person(s) Educated  Patient    Methods  Explanation    Comprehension  Verbalized understanding;Returned demonstration       PT Short Term Goals - 08/27/19 1307      PT SHORT TERM GOAL #1   Title  Pt will be independent with HEP in order to improve strength and balance in order to decrease fall risk and improve function at home    Time  6    Period  Weeks    Status  On-going    Target Date  07/14/19        PT Long Term Goals - 08/27/19 1307      PT LONG TERM GOAL #1   Title  Pt will improve BERG by at least 3 points in order to demonstrate clinically significant improvement in balance.    Baseline  06/02/19: 48/56; 07/08/19: 54/56; 08/27/19: 53/56    Time  12    Period  Weeks    Status  Achieved      PT LONG TERM GOAL #2   Title  Pt will improve ABC by at least 13% in order to demonstrate clinically significant improvement in balance confidence.    Baseline  06/02/19: 64.4%;  07/07/19: 65%; 08/27/19: 65%    Time  12    Period  Weeks    Status  On-going    Target Date  11/19/19      PT LONG TERM GOAL #3   Title  Pt will increase 30s Sit to Stand Test to greater than or equal to 14 repetitions in order to demonstrate clinically significant improvement in LE endurance and decrease in fall risk.    Baseline  06/02/19: 10 repetitions; 07/07/19: 13 repetitions; 08/27/19: 14.5 repetitions    Time  12    Period  Weeks    Status  Achieved      PT LONG TERM GOAL #4   Title  Pt will increase 6MWT by at least 110m(3217f in order to demonstrate clinically significant improvement in cardiopulmonary endurance and community ambulation    Baseline  06/10/20: 137553fno AD, no LOB, Rt foot slap without flat foot strike; 07/07/19: test terminated 2/2 fall; 07/09/19: 1565' with R AFO, no assistive device, mask donned; 08/27/19: 1615' with R AFO, no assistive, mask donned    Time  12    Period  Weeks    Status  Partially Met    Target Date  11/19/19      PT LONG TERM GOAL #5   Title  Pt will improve FGA by at least 4 points in order to demonstrate clinically significant improvement in balance and decreased risk for falls.    Baseline  08/27/19; 25/30    Time  12    Period  Weeks    Status  New    Target Date  11/19/19            Plan - 09/17/19 0859    Clinical Impression Statement Patient instructed in beginning balance and coordination exercise. Patient required min VCs and min A for dynamic balance challenges to improve weight shift and postural control. Patient requires min VCs to improve ankle stability with balance challenges.  Patients would benefit from  additional skilled PT intervention to improve balance/gait safety and reduce fall risk.   Personal Factors and Comorbidities  Age;Comorbidity 3+;Time since onset of injury/illness/exacerbation    Comorbidities  Urothelial cancer, DM, previous lumbar disc bulge, interstitial pulmonary disease    Examination-Activity  Limitations  Locomotion Level;Stairs;Transfers    Examination-Participation Restrictions  Community Activity;Laundry;Shop    Stability/Clinical Decision Making  Evolving/Moderate complexity    Rehab Potential  Fair    PT Frequency  2x / week    PT Duration  12 weeks    PT Treatment/Interventions  ADLs/Self Care Home Management;Aquatic Therapy;Biofeedback;Canalith Repostioning;Cryotherapy;Iontophoresis 48m/ml Dexamethasone;Moist Heat;Traction;Ultrasound;DME Instruction;Gait training;Stair training;Functional mobility training;Therapeutic activities;Therapeutic exercise;Balance training;Neuromuscular re-education;Patient/family education;Orthotic Fit/Training;Manual techniques;Dry needling;Energy conservation;Vestibular;Joint Manipulations    PT Next Visit Plan  Continue with strengthening and balance    PT Home Exercise Plan  Access Code: [email or web address]   Consulted and Agree with Plan of Care  Patient       Patient will benefit from skilled therapeutic intervention in order to improve the following deficits and impairments:  Abnormal gait, Decreased activity tolerance, Decreased balance, Decreased strength  Visit Diagnosis: Muscle weakness (generalized)  Unsteadiness on feet     Problem List Patient Active Problem List   Diagnosis Date Noted  . Chest pain 03/04/2019  . Anemia due to antineoplastic chemotherapy 03/03/2018  . Cough 03/03/2018  . Chemotherapy induced neutropenia ([place] 01/22/2018  . B12 deficiency 01/01/2018  . Hypomagnesemia 12/21/2017  . Encounter for antineoplastic chemotherapy 12/21/2017  . Malignant neoplasm of kidney ([place]   . [place]ial [place] ([place] 12/18/2017  . Malnutrition of moderate degree 12/18/2017  . Therapeutic opioid induced constipation   . Palliative care encounter   . Dehydration 12/17/2017  . Goals of care, counseling/discussion 12/13/2017  . Urothelial carcinoma of kidney, left ([place] 12/07/2017  . Right inguinal hernia 07/17/2015  . Family  history of colon cancer 07/17/2015  . Allergic rhinitis 11/12/2014  . Airway hyperreactivity 11/12/2014  . Atherosclerosis of coronary artery 11/12/2014  . Cheilitis 11/12/2014  . Narrowing of intervertebral disc space 11/12/2014  . Deflected nasal septum 11/12/2014  . HLD (hyperlipidemia) 11/12/2014  . BP (high blood pressure) 11/12/2014  . Adult hypothyroidism 11/12/2014  . Sleep apnea 11/12/2014  . Diabetes mellitus, type 2 ([place] 11/12/2014  . Degenerative disc disease, lumbar 09/24/2012  . Furunculosis 04/23/2012    MAlanson Puls PT DPT 09/17/2019, 9:00 AM  [place] [facility] [address] [address]Phone: [phone]  Fax:  [phone] Name: Patrick BLACKERBYMRN: [id number] of Birth: [birth year]

## 2019-09-18 ENCOUNTER — Inpatient Hospital Stay (HOSPITAL_BASED_OUTPATIENT_CLINIC_OR_DEPARTMENT_OTHER): Payer: Medicare Other | Admitting: Hospice and Palliative Medicine

## 2019-09-18 DIAGNOSIS — T451X5A Adverse effect of antineoplastic and immunosuppressive drugs, initial encounter: Secondary | ICD-10-CM

## 2019-09-18 DIAGNOSIS — D701 Agranulocytosis secondary to cancer chemotherapy: Secondary | ICD-10-CM | POA: Diagnosis not present

## 2019-09-18 NOTE — Progress Notes (Signed)
Virtual Visit via Telephone Note  I connected with Patrick Mcgee on 09/18/19 at  9:30 AM EST by telephone and verified that I am speaking with the correct person using two identifiers.   I discussed the limitations, risks, security and privacy concerns of performing an evaluation and management service by telephone and the availability of in person appointments. I also discussed with the patient that there may be a patient responsible charge related to this service. The patient expressed understanding and agreed to proceed.   History of Present Illness: [name] is a 74 y.o. male with multiple medical problems including metastatic urothelial carcinoma originally diagnosed in May 2019 status post chemotherapy and immunotherapy currently on third line enfortumab.  PMH is also notable for mild interstitial lung disease, diabetes, and CAD status post previous stenting. CT of the chest, abdomen, and pelvis on 07/08/2019 revealed significant disease improvement.  Patient was referred to palliative care to help address goals and manage ongoing symptoms.   Observations/Objective: Spoke with patient by phone.  He reports doing well.  He denies any significant changes or concerns.  No distressing symptoms were reported today.  Patient continues to work with physical therapy and says that his foot drop is improving.  He does continue to have peripheral neuropathy.  He plans to resume acupuncture.  We discussed option of trial of duloxetine with rotation from sertraline.  He says that he thinks he was prescribed duloxetine and has a prescription at home but could not locate it immediately.  He plans to bring it in with him when he returns on 3/30.  Assessment and Plan: Metastatic urothelial cancer -significant disease improvement on imaging in December 2020.  Patient continues on maintenance enfortumab and is followed in the clinic by Dr. Janese Mcgee.   Peripheral neuropathy -likely treatment related.  On  gabapentin 300 mg and is currently taking it twice daily.  I suggested he could increase dosing to 3 times daily.  Could also consider rotating from Zoloft to Cymbalta.  Agree with acupuncture  Follow Up Instructions: Follow-up telephone visit in 2 to 3 weeks   I discussed the assessment and treatment plan with the patient. The patient was provided an opportunity to ask questions and all were answered. The patient agreed with the plan and demonstrated an understanding of the instructions.   The patient was advised to call back or seek an in-person evaluation if the symptoms worsen or if the condition fails to improve as anticipated.  I provided 10 minutes of non-face-to-face time during this encounter.   Irean Hong, NP

## 2019-09-22 ENCOUNTER — Encounter: Payer: Self-pay | Admitting: Physical Therapy

## 2019-09-22 ENCOUNTER — Other Ambulatory Visit: Payer: Self-pay

## 2019-09-22 ENCOUNTER — Ambulatory Visit: Payer: Medicare Other | Admitting: Physical Therapy

## 2019-09-22 DIAGNOSIS — R2681 Unsteadiness on feet: Secondary | ICD-10-CM | POA: Diagnosis not present

## 2019-09-22 DIAGNOSIS — M6281 Muscle weakness (generalized): Secondary | ICD-10-CM | POA: Diagnosis not present

## 2019-09-22 NOTE — Therapy (Signed)
[place] MAIN [facility] SERVICES [address] [place], [place], [address] Phone: [phone]   Fax:  [phone]  Physical Therapy Treatment  Patient Details  Name: Patrick Mcgee MRN: [id number] Date of Birth: December 17, 1945 Referring Provider (PT): Dr. Rosanna Randy   Encounter Date: 09/22/2019    Past Medical History:  Diagnosis Date  . Acid reflux   . Anxiety   . Depression   . Diabetes mellitus without complication ([place])   . Hyperlipidemia   . Hypertension   . MRSA (methicillin resistant Staphylococcus aureus) infection 1992   history  . Myocardial infarction ([place]) 1995  . Sleep apnea    CPAP  . Urothelial cancer ([place]) 11/2017   Left Urothelial mass, chemo tx's    Past Surgical History:  Procedure Laterality Date  . CATARACT EXTRACTION Left   . COLONOSCOPY  2010   [facility]  . COLONOSCOPY WITH PROPOFOL N/A 10/04/2016   Procedure: COLONOSCOPY WITH PROPOFOL;  Surgeon: [name], MD;  Location: [facility] ENDOSCOPY;  Service: Endoscopy;  Laterality: N/A;  . [name], 2000, 2001, 2014  . CYSTOSCOPY W/ RETROGRADES Bilateral 12/10/2017   Procedure: CYSTOSCOPY WITH RETROGRADE PYELOGRAM;  Surgeon: [name], MD;  Location: [facility] ORS;  Service: Urology;  Laterality: Bilateral;  . CYSTOSCOPY W/ RETROGRADES Left 12/09/2018   Procedure: CYSTOSCOPY WITH RETROGRADE PYELOGRAM;  Surgeon: [name], MD;  Location: [facility] ORS;  Service: Urology;  Laterality: Left;  . CYSTOSCOPY WITH BIOPSY Left 12/09/2018   Procedure: CYSTOSCOPY WITH URETERAL/RENAL PELVIC BIOPSY;  Surgeon: [name], MD;  Location: [facility] ORS;  Service: Urology;  Laterality: Left;  . CYSTOSCOPY WITH STENT PLACEMENT Left 12/10/2017   Procedure: CYSTOSCOPY WITH STENT PLACEMENT;  Surgeon: [name], MD;  Location: [facility] ORS;  Service: Urology;  Laterality: Left;  . CYSTOSCOPY WITH STENT PLACEMENT Left 12/09/2018   Procedure: CYSTOSCOPY WITH  STENT PLACEMENT;  Surgeon: [name], MD;  Location: [facility] ORS;  Service: Urology;  Laterality: Left;  . CYSTOSCOPY WITH URETEROSCOPY Left 12/09/2018   Procedure: CYSTOSCOPY WITH URETEROSCOPY;  Surgeon: [name], MD;  Location: [facility] ORS;  Service: Urology;  Laterality: Left;  . EYE SURGERY Bilateral    cataract  . INGUINAL HERNIA REPAIR Right 08/09/2015   Procedure: HERNIA REPAIR INGUINAL ADULT;  Surgeon: [name], MD;  Location: [facility] ORS;  Service: General;  Laterality: Right;  . NASAL SINUS SURGERY    . nuclear stress test    . PORTA CATH INSERTION N/A 12/19/2017   Procedure: PORTA CATH INSERTION;  Surgeon: [name], MD;  Location: [place] CV LAB;  Service: Cardiovascular;  Laterality: N/A;  . URETERAL BIOPSY Left 12/10/2017   Procedure: URETERAL & renal PELVIS BIOPSY;  Surgeon: [name], MD;  Location: [facility] ORS;  Service: Urology;  Laterality: Left;  . URETEROSCOPY Left 12/10/2017   Procedure: URETEROSCOPY;  Surgeon: [name], MD;  Location: [facility] ORS;  Service: Urology;  Laterality: Left;    There were no vitals filed for this visit.  Subjective Assessment - 09/22/19 0846    Subjective  Pt reports doing ok today. Denies any pain upon arrival and no falls since last therapy session.    Pertinent History  Part of history borrowed from oncology note and verified with patient: Patient is a 74 year old male who was referred for foot drop. He started with Dr. [name] so far for his metastatic urothelial carcinoma.  This was originally diagnosed in May 2019. He was started on chemotherapy  in June 2019 and was continued on 05/27/2018 for 8 cycles.  He tolerated chemotherapy well except for chemo-induced anemia for which she has been getting Procrit every 2 weeks.  He did have response to his disease based on scans in August 2019.  However repeat scan on 05/31/2018 showed increase in the size of the primary tumor from 1.2 to 1.9 cm and increase in left para-aortic  adenopathy from 0.9 to 1.3 cm.  Second line immunotherapy was recommended.  His initial biopsy specimen did not have enough sample to undergo FGFR mutation testing. He also has mild interstitial lung disease for which he sees pulmonary but he is not on home oxygen.  He has B12 deficiency for which he is on oral B12.  Also has diabetes and coronary artery disease. Patient noted to have disease progression in his lymph nodes in May 2020.  Repeat biopsy showed metastatic urothelial carcinoma which did not have a FGFR mutation.  He has been started on third line Padcev. There is still times when he feels fatigued but he is having more good days. He reports poor appetite. He was working with physical therapy on his foot drop, RLE weakness, and unsteadiness. He states that he gets considerable numbness for the 7-8 days following his chemotherapy treatment. He states that he is a diabetic and did have some numbness prior to starting chemotherapy. He states that he has a history of a low back disc bulge with RLE radicular symptoms. He used to get spinal injections but reports he hasn't had any injections in the last 10 years.    Limitations  Walking    Currently in Pain?  No/denies    Pain Score  0-No pain         Treatment: Neuromuscular Training: Stool: staggered stance, head turns side/side, up/down x 5 reps each, each foot in front; VCs for proper technique and positioning for each exercise staggered stance, trunk rotation with 2 lb rod, VC to keep UE straight and turn head with trunk  Hurdle: Forward and backward stepping over hurdle x15 each direction, VCs to take big enough steps and to try to increase speed to work on coordination  Side stepping over hurdle 15x each direction  Blue Foam: Side stepping x10 on blue balance CGA for safety, VCs for taking a big enough step  Airex pad trunk rotation x2 min, CGA for safety, demonstrated difficulty with keeping arms extended and full rotation with head  turn Airex pad, balloon tapping to mirror x2 min, supervision for safety with varying directions and speed of balloon, VCs for utilizing both hands and minimizing UE support  BOSU: Lunge to BOSU ball x 10 , cues for going slow and to control the speed   Floor Star exercise: Performed stepping on the star diagram on the floor. Working on weight-shifting forward onto the foot that stepped forward and then weight-shifting back and bringing her feet back together with CGA. Performed 2 reps each foot.   Matrix: Fwd/bwd gait with 22. 5 lbs and CGA, cues for posture and stepping strategies, occasional LOB Side stepping left and right and CGA with cues to slow movement   Pt educated throughout session about proper posture and technique with exercises. Improved exercise technique, movement at target joints, use of target muscles after min to mod verbal, visual, tactile cues. CGA and Min to mod verbal cues used throughout with increased in postural sway and LOB most seen with narrow base of support and while on uneven surfaces.  Continues to have balance deficits typical with diagnosis. Patient performs intermediate level exercises without pain behaviors and needs verbal cuing for postural alignment and head positioning                      PT Education - 09/22/19 0847    Education Details  HEP    Person(s) Educated  Patient    Methods  Explanation    Comprehension  Returned demonstration;Verbalized understanding;Need further instruction       PT Short Term Goals - 08/27/19 1307      PT SHORT TERM GOAL #1   Title  Pt will be independent with HEP in order to improve strength and balance in order to decrease fall risk and improve function at home    Time  6    Period  Weeks    Status  On-going    Target Date  07/14/19        PT Long Term Goals - 08/27/19 1307      PT LONG TERM GOAL #1   Title  Pt will improve BERG by at least 3 points in order to demonstrate clinically  significant improvement in balance.    Baseline  06/02/19: 48/56; 07/08/19: 54/56; 08/27/19: 53/56    Time  12    Period  Weeks    Status  Achieved      PT LONG TERM GOAL #2   Title  Pt will improve ABC by at least 13% in order to demonstrate clinically significant improvement in balance confidence.    Baseline  06/02/19: 64.4%; 07/07/19: 65%; 08/27/19: 65%    Time  12    Period  Weeks    Status  On-going    Target Date  11/19/19      PT LONG TERM GOAL #3   Title  Pt will increase 30s Sit to Stand Test to greater than or equal to 14 repetitions in order to demonstrate clinically significant improvement in LE endurance and decrease in fall risk.    Baseline  06/02/19: 10 repetitions; 07/07/19: 13 repetitions; 08/27/19: 14.5 repetitions    Time  12    Period  Weeks    Status  Achieved      PT LONG TERM GOAL #4   Title  Pt will increase 6MWT by at least 140m(3237f in order to demonstrate clinically significant improvement in cardiopulmonary endurance and community ambulation    Baseline  06/10/20: 137566fno AD, no LOB, Rt foot slap without flat foot strike; 07/07/19: test terminated 2/2 fall; 07/09/19: 1565' with R AFO, no assistive device, mask donned; 08/27/19: 1615' with R AFO, no assistive, mask donned    Time  12    Period  Weeks    Status  Partially Met    Target Date  11/19/19      PT LONG TERM GOAL #5   Title  Pt will improve FGA by at least 4 points in order to demonstrate clinically significant improvement in balance and decreased risk for falls.    Baseline  08/27/19; 25/30    Time  12    Period  Weeks    Status  New    Target Date  11/19/19            Plan - 09/22/19 0847    Clinical Impression Statement Pt was able to progress dynamic balance exercises today, noting improved postural reactions with LOB and moving outside normal BOS.  Pt was able to perform all  exercises on uneven surfaces with minimal LOB noted.  Dynamic balance stability activities were progressed  today, with decrease control noted when attempting to perform tasks with single LE  Pt would continue to benefit from skilled therapy services to address further balance impairments and decrease falls risk.   Personal Factors and Comorbidities  Age;Comorbidity 3+;Time since onset of injury/illness/exacerbation    Comorbidities  Urothelial cancer, DM, previous lumbar disc bulge, interstitial pulmonary disease    Examination-Activity Limitations  Locomotion Level;Stairs;Transfers    Examination-Participation Restrictions  Community Activity;Laundry;Shop    Stability/Clinical Decision Making  Evolving/Moderate complexity    Rehab Potential  Fair    PT Frequency  2x / week    PT Duration  12 weeks    PT Treatment/Interventions  ADLs/Self Care Home Management;Aquatic Therapy;Biofeedback;Canalith Repostioning;Cryotherapy;Iontophoresis '4mg'$ /ml Dexamethasone;Moist Heat;Traction;Ultrasound;DME Instruction;Gait training;Stair training;Functional mobility training;Therapeutic activities;Therapeutic exercise;Balance training;Neuromuscular re-education;Patient/family education;Orthotic Fit/Training;Manual techniques;Dry needling;Energy conservation;Vestibular;Joint Manipulations    PT Next Visit Plan  Continue with strengthening and balance    PT Home Exercise Plan  Access Code: [email or web address]    Consulted and Agree with Plan of Care  Patient       Patient will benefit from skilled therapeutic intervention in order to improve the following deficits and impairments:  Abnormal gait, Decreased activity tolerance, Decreased balance, Decreased strength  Visit Diagnosis: Unsteadiness on feet  Muscle weakness (generalized)     Problem List Patient Active Problem List   Diagnosis Date Noted  . Chest pain 03/04/2019  . Anemia due to antineoplastic chemotherapy 03/03/2018  . Cough 03/03/2018  . Chemotherapy induced neutropenia ([place]) 01/22/2018  . B12 deficiency 01/01/2018  . Hypomagnesemia 12/21/2017  .  Encounter for antineoplastic chemotherapy 12/21/2017  . Malignant neoplasm of kidney ([place])   . Urothelial cancer ([place]) 12/18/2017  . Malnutrition of moderate degree 12/18/2017  . Therapeutic opioid induced constipation   . Palliative care encounter   . Dehydration 12/17/2017  . Goals of care, counseling/discussion 12/13/2017  . Urothelial carcinoma of kidney, left ([place]) 12/07/2017  . Right inguinal hernia 07/17/2015  . Family history of colon cancer 07/17/2015  . Allergic rhinitis 11/12/2014  . Airway hyperreactivity 11/12/2014  . Atherosclerosis of coronary artery 11/12/2014  . Cheilitis 11/12/2014  . Narrowing of intervertebral disc space 11/12/2014  . Deflected nasal septum 11/12/2014  . HLD (hyperlipidemia) 11/12/2014  . BP (high blood pressure) 11/12/2014  . Adult hypothyroidism 11/12/2014  . Sleep apnea 11/12/2014  . Diabetes mellitus, type 2 ([place]) 11/12/2014  . Degenerative disc disease, lumbar 09/24/2012  . Furunculosis 04/23/2012    Alanson Puls, PT DPT 09/22/2019, 8:49 AM  [place] MAIN [facility] SERVICES [address] [place], [place], [address] Phone: [phone]   Fax:  [phone]  Name: Patrick Mcgee MRN: [id number] Date of Birth: January 14, 1946

## 2019-09-24 ENCOUNTER — Encounter: Payer: Self-pay | Admitting: Physical Therapy

## 2019-09-24 ENCOUNTER — Ambulatory Visit: Payer: Medicare Other | Admitting: Physical Therapy

## 2019-09-24 ENCOUNTER — Other Ambulatory Visit: Payer: Self-pay

## 2019-09-24 DIAGNOSIS — M6281 Muscle weakness (generalized): Secondary | ICD-10-CM

## 2019-09-24 DIAGNOSIS — R2681 Unsteadiness on feet: Secondary | ICD-10-CM

## 2019-09-24 NOTE — Therapy (Addendum)
[place] MAIN [facility] SERVICES [address] [place], [place], [address] Phone: [phone]   Fax:  [phone]  Physical Therapy Treatment  Patient Details  Name: Patrick Mcgee MRN: [id number] Date of Birth: [birth year] Referring Provider (PT): Dr. Rosanna Randy   Encounter Date: 09/24/2019  PT End of Session - 09/24/19 1226    Visit Number  27    Number of Visits  49    Date for PT Re-Evaluation  11/19/19    PT Start Time  7893    PT Stop Time  8101    PT Time Calculation (min)  39 min    Equipment Utilized During Treatment  Gait belt    Activity Tolerance  Patient tolerated treatment well;No increased pain    Behavior During Therapy  WFL for tasks assessed/performed       Past Medical History:  Diagnosis Date  . Acid reflux   . Anxiety   . Depression   . Diabetes mellitus without complication ([place])   . Hyperlipidemia   . Hypertension   . MRSA (methicillin resistant Staphylococcus aureus) infection 1992   history  . Myocardial infarction ([place]) 1995  . Sleep apnea    CPAP  . Urothelial cancer ([place]) 11/2017   Left Urothelial mass, chemo tx's    Past Surgical History:  Procedure Laterality Date  . CATARACT EXTRACTION Left   . COLONOSCOPY  2010   [facility]  . COLONOSCOPY WITH PROPOFOL N/A 10/04/2016   Procedure: COLONOSCOPY WITH PROPOFOL;  Surgeon: [name], MD;  Location: [facility] ENDOSCOPY;  Service: Endoscopy;  Laterality: N/A;  . [place], 2000, 2001, 2014  . CYSTOSCOPY W/ RETROGRADES Bilateral 12/10/2017   Procedure: CYSTOSCOPY WITH RETROGRADE PYELOGRAM;  Surgeon: [name], MD;  Location: [facility] ORS;  Service: Urology;  Laterality: Bilateral;  . CYSTOSCOPY W/ RETROGRADES Left 12/09/2018   Procedure: CYSTOSCOPY WITH RETROGRADE PYELOGRAM;  Surgeon: [name], MD;  Location: [facility] ORS;  Service: Urology;  Laterality: Left;  . CYSTOSCOPY WITH BIOPSY Left 12/09/2018   Procedure:  CYSTOSCOPY WITH URETERAL/RENAL PELVIC BIOPSY;  Surgeon: [name], MD;  Location: [facility] ORS;  Service: Urology;  Laterality: Left;  . CYSTOSCOPY WITH STENT PLACEMENT Left 12/10/2017   Procedure: CYSTOSCOPY WITH STENT PLACEMENT;  Surgeon: [name], MD;  Location: [facility] ORS;  Service: Urology;  Laterality: Left;  . CYSTOSCOPY WITH STENT PLACEMENT Left 12/09/2018   Procedure: CYSTOSCOPY WITH STENT PLACEMENT;  Surgeon: [name], MD;  Location: [facility] ORS;  Service: Urology;  Laterality: Left;  . CYSTOSCOPY WITH URETEROSCOPY Left 12/09/2018   Procedure: CYSTOSCOPY WITH URETEROSCOPY;  Surgeon: [name], MD;  Location: [facility] ORS;  Service: Urology;  Laterality: Left;  . EYE SURGERY Bilateral    cataract  . INGUINAL HERNIA REPAIR Right 08/09/2015   Procedure: HERNIA REPAIR INGUINAL ADULT;  Surgeon: [name], MD;  Location: [facility] ORS;  Service: General;  Laterality: Right;  . NASAL SINUS SURGERY    . nuclear stress test    . PORTA CATH INSERTION N/A 12/19/2017   Procedure: PORTA CATH INSERTION;  Surgeon: [name], MD;  Location: [place] CV LAB;  Service: Cardiovascular;  Laterality: N/A;  . URETERAL BIOPSY Left 12/10/2017   Procedure: URETERAL & renal PELVIS BIOPSY;  Surgeon: [name], MD;  Location: [facility] ORS;  Service: Urology;  Laterality: Left;  . URETEROSCOPY Left 12/10/2017   Procedure: URETEROSCOPY;  Surgeon: [name], MD;  Location: [facility] ORS;  Service: Urology;  Laterality: Left;    There were no vitals filed for this visit.  Subjective Assessment - 09/24/19 1225    Subjective  Pt reports doing ok today. Denies any pain upon arrival and no falls since last therapy session.    Pertinent History  Part of history borrowed from oncology note and verified with patient: Patient is a 74 year old male who was referred for foot drop. He started with Dr. [name] so far for his metastatic urothelial carcinoma.  This was originally diagnosed in May 2019. He was  started on chemotherapy in June 2019 and was continued on 05/27/2018 for 8 cycles.  He tolerated chemotherapy well except for chemo-induced anemia for which she has been getting Procrit every 2 weeks.  He did have response to his disease based on scans in August 2019.  However repeat scan on 05/31/2018 showed increase in the size of the primary tumor from 1.2 to 1.9 cm and increase in left para-aortic adenopathy from 0.9 to 1.3 cm.  Second line immunotherapy was recommended.  His initial biopsy specimen did not have enough sample to undergo FGFR mutation testing. He also has mild interstitial lung disease for which he sees pulmonary but he is not on home oxygen.  He has B12 deficiency for which he is on oral B12.  Also has diabetes and coronary artery disease. Patient noted to have disease progression in his lymph nodes in May 2020.  Repeat biopsy showed metastatic urothelial carcinoma which did not have a FGFR mutation.  He has been started on third line Padcev. There is still times when he feels fatigued but he is having more good days. He reports poor appetite. He was working with physical therapy on his foot drop, RLE weakness, and unsteadiness. He states that he gets considerable numbness for the 7-8 days following his chemotherapy treatment. He states that he is a diabetic and did have some numbness prior to starting chemotherapy. He states that he has a history of a low back disc bulge with RLE radicular symptoms. He used to get spinal injections but reports he hasn't had any injections in the last 10 years.    Limitations  Walking    Currently in Pain?  No/denies    Pain Score  0-No pain       Treatment: Neuromuscular Training: Stool: staggered stance, head turns side/side, up/down x 5 reps each, each foot in front; VCs for proper technique and positioning for each exercise staggered stance, trunk rotation with 2 lb rod, VC to keep UE straight and turn head with trunk  Hurdle: Forward and  backward stepping over hurdle x15 each direction, VCs to take big enough steps and to try to increase speed to work on coordination  Side stepping over hurdle 15x each direction  Blue Foam: Side stepping x10 on blue balance CGA for safety, VCs for taking a big enough step  Airex pad trunk rotation x2 min, CGA for safety, demonstrated difficulty with keeping arms extended and full rotation with head turn Airex pad, balloon tapping to mirror x2 min, supervision for safety with varying directions and speed of balloon, VCs for utilizing both hands and minimizing UE support  BOSU: Lunge to BOSU ball x 10 , cues for going slow and to control the speed  Floor Star exercise: Performed stepping on the star diagram on the floor. Working on weight-shifting forward onto the foot that stepped forward and then weight-shifting back and bringing her feet back together with CGA. Performed 2 reps each foot.  Matrix: Fwd/bwd gait with 22. 5 lbs and CGA, cues for posture and stepping strategies, occasional LOB Side stepping left and right and CGA with cues to slow movement    Pt educated throughout session about proper posture and technique with exercises. Improved exercise technique, movement at target joints, use of target muscles after min to mod verbal, visual, tactile cues. CGA and Min to mod verbal cues used throughout with increased in postural sway and LOB most seen with narrow base of support and while on uneven surfaces. Continues to have balance deficits typical with diagnosis                     PT Education - 09/24/19 1226    Education Details  HEP    Person(s) Educated  Patient    Methods  Explanation    Comprehension  Verbalized understanding;Returned demonstration;Need further instruction       PT Short Term Goals - 08/27/19 1307      PT SHORT TERM GOAL #1   Title  Pt will be independent with HEP in order to improve strength and balance in order to decrease fall risk  and improve function at home    Time  6    Period  Weeks    Status  On-going    Target Date  07/14/19        PT Long Term Goals - 08/27/19 1307      PT LONG TERM GOAL #1   Title  Pt will improve BERG by at least 3 points in order to demonstrate clinically significant improvement in balance.    Baseline  06/02/19: 48/56; 07/08/19: 54/56; 08/27/19: 53/56    Time  12    Period  Weeks    Status  Achieved      PT LONG TERM GOAL #2   Title  Pt will improve ABC by at least 13% in order to demonstrate clinically significant improvement in balance confidence.    Baseline  06/02/19: 64.4%; 07/07/19: 65%; 08/27/19: 65%    Time  12    Period  Weeks    Status  On-going    Target Date  11/19/19      PT LONG TERM GOAL #3   Title  Pt will increase 30s Sit to Stand Test to greater than or equal to 14 repetitions in order to demonstrate clinically significant improvement in LE endurance and decrease in fall risk.    Baseline  06/02/19: 10 repetitions; 07/07/19: 13 repetitions; 08/27/19: 14.5 repetitions    Time  12    Period  Weeks    Status  Achieved      PT LONG TERM GOAL #4   Title  Pt will increase 6MWT by at least 190m(3245f in order to demonstrate clinically significant improvement in cardiopulmonary endurance and community ambulation    Baseline  06/10/20: 137582fno AD, no LOB, Rt foot slap without flat foot strike; 07/07/19: test terminated 2/2 fall; 07/09/19: 1565' with R AFO, no assistive device, mask donned; 08/27/19: 1615' with R AFO, no assistive, mask donned    Time  12    Period  Weeks    Status  Partially Met    Target Date  11/19/19      PT LONG TERM GOAL #5   Title  Pt will improve FGA by at least 4 points in order to demonstrate clinically significant improvement in balance and decreased risk for falls.    Baseline  08/27/19; 25/30  Time  12    Period  Weeks    Status  New    Target Date  11/19/19            Plan - 09/24/19 1227    Clinical Impression  Statement  Pt presents with unsteadiness on uneven surfaces and fatigues with therapeutic exercises.  Patient tolerated all interventions well this date and will benefit from continued skilled PT interventions to improve strength and balance and decrease risk of falling.   Personal Factors and Comorbidities  Age;Comorbidity 3+;Time since onset of injury/illness/exacerbation    Comorbidities  Urothelial cancer, DM, previous lumbar disc bulge, interstitial pulmonary disease    Examination-Activity Limitations  Locomotion Level;Stairs;Transfers    Examination-Participation Restrictions  Community Activity;Laundry;Shop    Stability/Clinical Decision Making  Evolving/Moderate complexity    Rehab Potential  Fair    PT Frequency  2x / week    PT Duration  12 weeks    PT Treatment/Interventions  ADLs/Self Care Home Management;Aquatic Therapy;Biofeedback;Canalith Repostioning;Cryotherapy;Iontophoresis '4mg'$ /ml Dexamethasone;Moist Heat;Traction;Ultrasound;DME Instruction;Gait training;Stair training;Functional mobility training;Therapeutic activities;Therapeutic exercise;Balance training;Neuromuscular re-education;Patient/family education;Orthotic Fit/Training;Manual techniques;Dry needling;Energy conservation;Vestibular;Joint Manipulations    PT Next Visit Plan  Continue with strengthening and balance    PT Home Exercise Plan  Access Code: [email or web address]    Consulted and Agree with Plan of Care  Patient       Patient will benefit from skilled therapeutic intervention in order to improve the following deficits and impairments:  Abnormal gait, Decreased activity tolerance, Decreased balance, Decreased strength  Visit Diagnosis: Unsteadiness on feet  Muscle weakness (generalized)     Problem List Patient Active Problem List   Diagnosis Date Noted  . Chest pain 03/04/2019  . Anemia due to antineoplastic chemotherapy 03/03/2018  . Cough 03/03/2018  . Chemotherapy induced neutropenia ([place]) 01/22/2018  .  B12 deficiency 01/01/2018  . Hypomagnesemia 12/21/2017  . Encounter for antineoplastic chemotherapy 12/21/2017  . Malignant neoplasm of kidney ([place])   . Urothelial cancer ([place]) 12/18/2017  . Malnutrition of moderate degree 12/18/2017  . Therapeutic opioid induced constipation   . Palliative care encounter   . Dehydration 12/17/2017  . Goals of care, counseling/discussion 12/13/2017  . Urothelial carcinoma of kidney, left ([place]) 12/07/2017  . Right inguinal hernia 07/17/2015  . Family history of colon cancer 07/17/2015  . Allergic rhinitis 11/12/2014  . Airway hyperreactivity 11/12/2014  . Atherosclerosis of coronary artery 11/12/2014  . Cheilitis 11/12/2014  . Narrowing of intervertebral disc space 11/12/2014  . Deflected nasal septum 11/12/2014  . HLD (hyperlipidemia) 11/12/2014  . BP (high blood pressure) 11/12/2014  . Adult hypothyroidism 11/12/2014  . Sleep apnea 11/12/2014  . Diabetes mellitus, type 2 ([place]) 11/12/2014  . Degenerative disc disease, lumbar 09/24/2012  . Furunculosis 04/23/2012    Alanson Puls, PT DPT 09/24/2019, 12:28 PM  [place] MAIN [facility] SERVICES [address] [place], [place], [address] Phone: [phone]   Fax:  [phone]  Name: Patrick Mcgee MRN: [id number] Date of Birth: 04-May-1946

## 2019-09-29 ENCOUNTER — Other Ambulatory Visit: Payer: Self-pay

## 2019-09-29 ENCOUNTER — Ambulatory Visit: Payer: Medicare Other

## 2019-09-29 DIAGNOSIS — M6281 Muscle weakness (generalized): Secondary | ICD-10-CM | POA: Diagnosis not present

## 2019-09-29 DIAGNOSIS — R2681 Unsteadiness on feet: Secondary | ICD-10-CM

## 2019-09-29 NOTE — Therapy (Signed)
[place] MAIN [facility] SERVICES [address] [place], [place], [address] Phone: [phone]   Fax:  [phone]  Physical Therapy Treatment  Patient Details  Name: Patrick Mcgee MRN: [id number] Date of Birth: April 18, 1946 Referring Provider (PT): Dr. Rosanna Randy   Encounter Date: 09/29/2019  PT End of Session - 09/29/19 1628    Visit Number  28    Number of Visits  49    Date for PT Re-Evaluation  11/19/19    PT Start Time  3419    PT Stop Time  1650    PT Time Calculation (min)  45 min    Equipment Utilized During Treatment  Gait belt    Activity Tolerance  Patient tolerated treatment well;No increased pain    Behavior During Therapy  WFL for tasks assessed/performed       Past Medical History:  Diagnosis Date  . Acid reflux   . Anxiety   . Depression   . Diabetes mellitus without complication ([place])   . Hyperlipidemia   . Hypertension   . MRSA (methicillin resistant Staphylococcus aureus) infection 1992   history  . Myocardial infarction ([place]) 1995  . Sleep apnea    CPAP  . Urothelial cancer ([place]) 11/2017   Left Urothelial mass, chemo tx's    Past Surgical History:  Procedure Laterality Date  . CATARACT EXTRACTION Left   . COLONOSCOPY  2010   [facility]  . COLONOSCOPY WITH PROPOFOL N/A 10/04/2016   Procedure: COLONOSCOPY WITH PROPOFOL;  Surgeon: [name], MD;  Location: [facility] ENDOSCOPY;  Service: Endoscopy;  Laterality: N/A;  . [place], 2000, 2001, 2014  . CYSTOSCOPY W/ RETROGRADES Bilateral 12/10/2017   Procedure: CYSTOSCOPY WITH RETROGRADE PYELOGRAM;  Surgeon: [name], MD;  Location: [facility] ORS;  Service: Urology;  Laterality: Bilateral;  . CYSTOSCOPY W/ RETROGRADES Left 12/09/2018   Procedure: CYSTOSCOPY WITH RETROGRADE PYELOGRAM;  Surgeon: [name], MD;  Location: [facility] ORS;  Service: Urology;  Laterality: Left;  . CYSTOSCOPY WITH BIOPSY Left 12/09/2018   Procedure:  CYSTOSCOPY WITH URETERAL/RENAL PELVIC BIOPSY;  Surgeon: [name], MD;  Location: [facility] ORS;  Service: Urology;  Laterality: Left;  . CYSTOSCOPY WITH STENT PLACEMENT Left 12/10/2017   Procedure: CYSTOSCOPY WITH STENT PLACEMENT;  Surgeon: [name], MD;  Location: [facility] ORS;  Service: Urology;  Laterality: Left;  . CYSTOSCOPY WITH STENT PLACEMENT Left 12/09/2018   Procedure: CYSTOSCOPY WITH STENT PLACEMENT;  Surgeon: [name], MD;  Location: [facility] ORS;  Service: Urology;  Laterality: Left;  . CYSTOSCOPY WITH URETEROSCOPY Left 12/09/2018   Procedure: CYSTOSCOPY WITH URETEROSCOPY;  Surgeon: [name], MD;  Location: [facility] ORS;  Service: Urology;  Laterality: Left;  . EYE SURGERY Bilateral    cataract  . INGUINAL HERNIA REPAIR Right 08/09/2015   Procedure: HERNIA REPAIR INGUINAL ADULT;  Surgeon: [name], MD;  Location: [facility] ORS;  Service: General;  Laterality: Right;  . NASAL SINUS SURGERY    . nuclear stress test    . PORTA CATH INSERTION N/A 12/19/2017   Procedure: PORTA CATH INSERTION;  Surgeon: [name], MD;  Location: [facility];  Service: Cardiovascular;  Laterality: N/A;  . URETERAL BIOPSY Left 12/10/2017   Procedure: URETERAL & renal PELVIS BIOPSY;  Surgeon: [name], MD;  Location: [facility] ORS;  Service: Urology;  Laterality: Left;  . URETEROSCOPY Left 12/10/2017   Procedure: URETEROSCOPY;  Surgeon: [name], MD;  Location: [facility] ORS;  Service: Urology;  Laterality: Left;    There were no vitals filed for this visit.  Subjective Assessment - 09/29/19 1611    Subjective  Pt reports doing ok today. Denies any pain upon arrival. No falls or stumbles since last therapy session. No specific questions or concerns currently.    Pertinent History  Part of history borrowed from oncology note and verified with patient: Patient is a 74 year old male who was referred for foot drop. He started with Dr. Mike Mcgee so far for his metastatic urothelial  carcinoma.  This was originally diagnosed in May 2019. He was started on chemotherapy in June 2019 and was continued on 05/27/2018 for 8 cycles.  He tolerated chemotherapy well except for chemo-induced anemia for which she has been getting Procrit every 2 weeks.  He did have response to his disease based on scans in August 2019.  However repeat scan on 05/31/2018 showed increase in the size of the primary tumor from 1.2 to 1.9 cm and increase in left para-aortic adenopathy from 0.9 to 1.3 cm.  Second line immunotherapy was recommended.  His initial biopsy specimen did not have enough sample to undergo FGFR mutation testing. He also has mild interstitial lung disease for which he sees pulmonary but he is not on home oxygen.  He has B12 deficiency for which he is on oral B12.  Also has diabetes and coronary artery disease. Patient noted to have disease progression in his lymph nodes in May 2020.  Repeat biopsy showed metastatic urothelial carcinoma which did not have a FGFR mutation.  He has been started on third line Padcev. There is still times when he feels fatigued but he is having more good days. He reports poor appetite. He was working with physical therapy on his foot drop, RLE weakness, and unsteadiness. He states that he gets considerable numbness for the 7-8 days following his chemotherapy treatment. He states that he is a diabetic and did have some numbness prior to starting chemotherapy. He states that he has a history of a low back disc bulge with RLE radicular symptoms. He used to get spinal injections but reports he hasn't had any injections in the last 10 years.    Limitations  Walking    Currently in Pain?  No/denies         TREATMENT   Ther-ex NuStep L2-4 x 5 minutes total for warm-up during history (3 minutes unbilled); Precor BLE leg press 100# x 20, 115# x 20; Precor BLE heel raises 55# x 20; Forward, lateral, and cross-over 6" step-ups without UE support x 10 each; STS from  chair with no UE support, Airex pad under feet, and 3kg med ball overhead press 2 x 10;   Neuromuscular Re-education Airex NBOS eyes closed x 30s; Airex 6" step taps without UE support alternating LE x 10 each; Airex 12" step taps without UE support alternating LE x 10 each; Airex NBOS eyes open horizontal head turns x 30s; Airex slow marching x 10 each; 1/2 foam roll A/P balance x 30s; 1/2 foam roll heel/toe rocking x 10 each direction;   Pt educated throughout session about proper posture and technique with exercises. Improved exercise technique, movement at target joints, use of target muscles after min to mod verbal, visual, tactile cues.    Ptdemonstrates excellent motivation during session today. He is able to increase the resistance again on the leg press today.Added Airex pad under feed to sit to stand with overhead med ball press. He does struggle some with cross-over step-ups  as far as sequencing goes and requires assist to prevent him from turning his body to face the step. Signed AFO order faxed over to [facility] for scheduling.Pt encouraged to continue HEP and follow-up as scheduled. Hewould benefit from further skilled PT intervention to maximize safety, mobility, and independence.                        PT Short Term Goals - 08/27/19 1307      PT SHORT TERM GOAL #1   Title  Pt will be independent with HEP in order to improve strength and balance in order to decrease fall risk and improve function at home    Time  6    Period  Weeks    Status  On-going    Target Date  07/14/19        PT Long Term Goals - 08/27/19 1307      PT LONG TERM GOAL #1   Title  Pt will improve BERG by at least 3 points in order to demonstrate clinically significant improvement in balance.    Baseline  06/02/19: 48/56; 07/08/19: 54/56; 08/27/19: 53/56    Time  12    Period  Weeks    Status  Achieved      PT LONG TERM GOAL #2   Title  Pt will improve ABC  by at least 13% in order to demonstrate clinically significant improvement in balance confidence.    Baseline  06/02/19: 64.4%; 07/07/19: 65%; 08/27/19: 65%    Time  12    Period  Weeks    Status  On-going    Target Date  11/19/19      PT LONG TERM GOAL #3   Title  Pt will increase 30s Sit to Stand Test to greater than or equal to 14 repetitions in order to demonstrate clinically significant improvement in LE endurance and decrease in fall risk.    Baseline  06/02/19: 10 repetitions; 07/07/19: 13 repetitions; 08/27/19: 14.5 repetitions    Time  12    Period  Weeks    Status  Achieved      PT LONG TERM GOAL #4   Title  Pt will increase 6MWT by at least 154m(3232f in order to demonstrate clinically significant improvement in cardiopulmonary endurance and community ambulation    Baseline  06/10/20: [id number] AD, no LOB, Rt foot slap without flat foot strike; 07/07/19: test terminated 2/2 fall; 07/09/19: 1565' with R AFO, no assistive device, mask donned; 08/27/19: 1615' with R AFO, no assistive, mask donned    Time  12    Period  Weeks    Status  Partially Met    Target Date  11/19/19      PT LONG TERM GOAL #5   Title  Pt will improve FGA by at least 4 points in order to demonstrate clinically significant improvement in balance and decreased risk for falls.    Baseline  08/27/19; 25/30    Time  12    Period  Weeks    Status  New    Target Date  11/19/19            Plan - 09/29/19 1629    Clinical Impression Statement  Pt demonstrates excellent motivation during session today.  He is able to increase the resistance again on the leg press today. Added Airex pad under feed to sit to stand with overhead med ball press. He does struggle some with cross-over  step-ups as far as sequencing goes and requires assist to prevent him from turning his body to face the step. Signed AFO order faxed over to [facility] for scheduling. Pt encouraged to continue HEP and follow-up as scheduled. He  would benefit from further skilled PT intervention to maximize safety, mobility, and independence.    Personal Factors and Comorbidities  Age;Comorbidity 3+;Time since onset of injury/illness/exacerbation    Comorbidities  Urothelial cancer, DM, previous lumbar disc bulge, interstitial pulmonary disease    Examination-Activity Limitations  Locomotion Level;Stairs;Transfers    Examination-Participation Restrictions  Community Activity;Laundry;Shop    Stability/Clinical Decision Making  Evolving/Moderate complexity    Rehab Potential  Fair    PT Frequency  2x / week    PT Duration  12 weeks    PT Treatment/Interventions  ADLs/Self Care Home Management;Aquatic Therapy;Biofeedback;Canalith Repostioning;Cryotherapy;Iontophoresis '4mg'$ /ml Dexamethasone;Moist Heat;Traction;Ultrasound;DME Instruction;Gait training;Stair training;Functional mobility training;Therapeutic activities;Therapeutic exercise;Balance training;Neuromuscular re-education;Patient/family education;Orthotic Fit/Training;Manual techniques;Dry needling;Energy conservation;Vestibular;Joint Manipulations    PT Next Visit Plan  Continue with strengthening and balance    PT Home Exercise Plan  Access Code: [email or web address]    Consulted and Agree with Plan of Care  Patient       Patient will benefit from skilled therapeutic intervention in order to improve the following deficits and impairments:  Abnormal gait, Decreased activity tolerance, Decreased balance, Decreased strength  Visit Diagnosis: Unsteadiness on feet  Muscle weakness (generalized)     Problem List Patient Active Problem List   Diagnosis Date Noted  . Chest pain 03/04/2019  . Anemia due to antineoplastic chemotherapy 03/03/2018  . Cough 03/03/2018  . Chemotherapy induced neutropenia ([place]) 01/22/2018  . B12 deficiency 01/01/2018  . Hypomagnesemia 12/21/2017  . Encounter for antineoplastic chemotherapy 12/21/2017  . Malignant neoplasm of kidney ([place])   . Urothelial cancer  ([place]) 12/18/2017  . Malnutrition of moderate degree 12/18/2017  . Therapeutic opioid induced constipation   . Palliative care encounter   . Dehydration 12/17/2017  . Goals of care, counseling/discussion 12/13/2017  . Urothelial carcinoma of kidney, left ([place]) 12/07/2017  . Right inguinal hernia 07/17/2015  . Family history of colon cancer 07/17/2015  . Allergic rhinitis 11/12/2014  . Airway hyperreactivity 11/12/2014  . Atherosclerosis of coronary artery 11/12/2014  . Cheilitis 11/12/2014  . Narrowing of intervertebral disc space 11/12/2014  . Deflected nasal septum 11/12/2014  . HLD (hyperlipidemia) 11/12/2014  . BP (high blood pressure) 11/12/2014  . Adult hypothyroidism 11/12/2014  . Sleep apnea 11/12/2014  . Diabetes mellitus, type 2 ([place]) 11/12/2014  . Degenerative disc disease, lumbar 09/24/2012  . Furunculosis 04/23/2012   Phillips Grout PT, DPT, GCS  Daymeon Fischman 09/30/2019, 9:11 AM  [place] MAIN [facility] SERVICES [address] [place], [place], [address] Phone: [phone]   Fax:  [phone]  Name: CARROLL LINGELBACH MRN: [id number] Date of Birth: 10/22/1945

## 2019-10-01 ENCOUNTER — Ambulatory Visit: Payer: Medicare Other | Admitting: Physical Therapy

## 2019-10-01 ENCOUNTER — Encounter: Payer: Self-pay | Admitting: Physical Therapy

## 2019-10-01 ENCOUNTER — Other Ambulatory Visit: Payer: Self-pay

## 2019-10-01 DIAGNOSIS — R2681 Unsteadiness on feet: Secondary | ICD-10-CM | POA: Diagnosis not present

## 2019-10-01 DIAGNOSIS — M6281 Muscle weakness (generalized): Secondary | ICD-10-CM | POA: Diagnosis not present

## 2019-10-01 NOTE — Therapy (Signed)
[place] MAIN [facility] SERVICES [address] [place], [place], [address] Phone: [phone]   Fax:  [phone]  Physical Therapy Treatment  Patient Details  Name: Patrick Mcgee MRN: [id number] Date of Birth: 06/20/1946 Referring Provider (PT): Dr. Rosanna Randy   Encounter Date: 10/01/2019  PT End of Session - 10/01/19 1704    Visit Number  29    Number of Visits  49    Date for PT Re-Evaluation  11/19/19    PT Start Time  1700    PT Stop Time  1740    PT Time Calculation (min)  40 min    Equipment Utilized During Treatment  Gait belt    Activity Tolerance  Patient tolerated treatment well;No increased pain    Behavior During Therapy  WFL for tasks assessed/performed       Past Medical History:  Diagnosis Date  . Acid reflux   . Anxiety   . Depression   . Diabetes mellitus without complication ([place])   . Hyperlipidemia   . Hypertension   . MRSA (methicillin resistant Staphylococcus aureus) infection 1992   history  . Myocardial infarction ([place]) 1995  . Sleep apnea    CPAP  . Urothelial cancer ([place]) 11/2017   Left Urothelial mass, chemo tx's    Past Surgical History:  Procedure Laterality Date  . CATARACT EXTRACTION Left   . COLONOSCOPY  2010   [facility]  . COLONOSCOPY WITH PROPOFOL N/A 10/04/2016   Procedure: COLONOSCOPY WITH PROPOFOL;  Surgeon: [name], MD;  Location: [facility] ENDOSCOPY;  Service: Endoscopy;  Laterality: N/A;  . [place], 2000, 2001, 2014  . CYSTOSCOPY W/ RETROGRADES Bilateral 12/10/2017   Procedure: CYSTOSCOPY WITH RETROGRADE PYELOGRAM;  Surgeon: [name], MD;  Location: [facility] ORS;  Service: Urology;  Laterality: Bilateral;  . CYSTOSCOPY W/ RETROGRADES Left 12/09/2018   Procedure: CYSTOSCOPY WITH RETROGRADE PYELOGRAM;  Surgeon: [name], MD;  Location: [facility] ORS;  Service: Urology;  Laterality: Left;  . CYSTOSCOPY WITH BIOPSY Left 12/09/2018   Procedure:  CYSTOSCOPY WITH URETERAL/RENAL PELVIC BIOPSY;  Surgeon: [name], MD;  Location: [facility] ORS;  Service: Urology;  Laterality: Left;  . CYSTOSCOPY WITH STENT PLACEMENT Left 12/10/2017   Procedure: CYSTOSCOPY WITH STENT PLACEMENT;  Surgeon: [name], MD;  Location: [facility] ORS;  Service: Urology;  Laterality: Left;  . CYSTOSCOPY WITH STENT PLACEMENT Left 12/09/2018   Procedure: CYSTOSCOPY WITH STENT PLACEMENT;  Surgeon: [name], MD;  Location: [facility] ORS;  Service: Urology;  Laterality: Left;  . CYSTOSCOPY WITH URETEROSCOPY Left 12/09/2018   Procedure: CYSTOSCOPY WITH URETEROSCOPY;  Surgeon: [name], MD;  Location: [facility] ORS;  Service: Urology;  Laterality: Left;  . EYE SURGERY Bilateral    cataract  . INGUINAL HERNIA REPAIR Right 08/09/2015   Procedure: HERNIA REPAIR INGUINAL ADULT;  Surgeon: [name], MD;  Location: [facility] ORS;  Service: General;  Laterality: Right;  . NASAL SINUS SURGERY    . nuclear stress test    . PORTA CATH INSERTION N/A 12/19/2017   Procedure: PORTA CATH INSERTION;  Surgeon: [name], MD;  Location: [place] CV LAB;  Service: Cardiovascular;  Laterality: N/A;  . URETERAL BIOPSY Left 12/10/2017   Procedure: URETERAL & renal PELVIS BIOPSY;  Surgeon: [name], MD;  Location: [facility] ORS;  Service: Urology;  Laterality: Left;  . URETEROSCOPY Left 12/10/2017   Procedure: URETEROSCOPY;  Surgeon: [name], MD;  Location: [facility] ORS;  Service: Urology;  Laterality: Left;    There were no vitals filed for this visit.  Subjective Assessment - 10/01/19 1704    Subjective  Pt reports doing ok today. Denies any pain upon arrival. No falls or stumbles since last therapy session. No specific questions or concerns currently.    Pertinent History  Part of history borrowed from oncology note and verified with patient: Patient is a 74 year old male who was referred for foot drop. He started with Dr. [name] so far for his metastatic urothelial  carcinoma.  This was originally diagnosed in May 2019. He was started on chemotherapy in June 2019 and was continued on 05/27/2018 for 8 cycles.  He tolerated chemotherapy well except for chemo-induced anemia for which she has been getting Procrit every 2 weeks.  He did have response to his disease based on scans in August 2019.  However repeat scan on 05/31/2018 showed increase in the size of the primary tumor from 1.2 to 1.9 cm and increase in left para-aortic adenopathy from 0.9 to 1.3 cm.  Second line immunotherapy was recommended.  His initial biopsy specimen did not have enough sample to undergo FGFR mutation testing. He also has mild interstitial lung disease for which he sees pulmonary but he is not on home oxygen.  He has B12 deficiency for which he is on oral B12.  Also has diabetes and coronary artery disease. Patient noted to have disease progression in his lymph nodes in May 2020.  Repeat biopsy showed metastatic urothelial carcinoma which did not have a FGFR mutation.  He has been started on third line Padcev. There is still times when he feels fatigued but he is having more good days. He reports poor appetite. He was working with physical therapy on his foot drop, RLE weakness, and unsteadiness. He states that he gets considerable numbness for the 7-8 days following his chemotherapy treatment. He states that he is a diabetic and did have some numbness prior to starting chemotherapy. He states that he has a history of a low back disc bulge with RLE radicular symptoms. He used to get spinal injections but reports he hasn't had any injections in the last 10 years.    Limitations  Walking    Currently in Pain?  No/denies    Pain Score  0-No pain       Neuromuscular Re-education   Tandem stand on foam , with trunk rotation without UE support x 2 lengths Side stepping on foam  without UE support x 2 lengths Heel/toe raises without UE support 3s hold x 10 each 1/2 foam roll balance with flat side  up 30s x 2 reps 1/2 foam roll balance with flat side down 30s x 2 reps 1/2 foam roll tandem balance alternating forward LE 30s x 2 each LE forward Lateral side steps from foam to 6 inch stool left and right x 15 Backwards stepping from foam to 6 inch stool x 15  Leg press x 100 lbs x 20 x 20 Matrix x 22. 5 lbs fwd/bwd, side to side x 3 reps      Pt educated throughout session about proper posture and technique with exercises. Improved exercise technique, movement at target joints, use of target muscles after min to mod verbal, visual, tactile cues. CGA and Min to mod verbal cues used throughout with increased in postural sway and LOB most seen with narrow base of support and while on uneven surfaces.  PT Education - 10/01/19 1704    Education Details  HEP    Person(s) Educated  Patient    Methods  Demonstration    Comprehension  Returned demonstration;Need further instruction;Verbalized understanding       PT Short Term Goals - 08/27/19 1307      PT SHORT TERM GOAL #1   Title  Pt will be independent with HEP in order to improve strength and balance in order to decrease fall risk and improve function at home    Time  6    Period  Weeks    Status  On-going    Target Date  07/14/19        PT Long Term Goals - 08/27/19 1307      PT LONG TERM GOAL #1   Title  Pt will improve BERG by at least 3 points in order to demonstrate clinically significant improvement in balance.    Baseline  06/02/19: 48/56; 07/08/19: 54/56; 08/27/19: 53/56    Time  12    Period  Weeks    Status  Achieved      PT LONG TERM GOAL #2   Title  Pt will improve ABC by at least 13% in order to demonstrate clinically significant improvement in balance confidence.    Baseline  06/02/19: 64.4%; 07/07/19: 65%; 08/27/19: 65%    Time  12    Period  Weeks    Status  On-going    Target Date  11/19/19      PT LONG TERM GOAL #3   Title  Pt will increase 30s Sit to Stand  Test to greater than or equal to 14 repetitions in order to demonstrate clinically significant improvement in LE endurance and decrease in fall risk.    Baseline  06/02/19: 10 repetitions; 07/07/19: 13 repetitions; 08/27/19: 14.5 repetitions    Time  12    Period  Weeks    Status  Achieved      PT LONG TERM GOAL #4   Title  Pt will increase 6MWT by at least 124m(3253f in order to demonstrate clinically significant improvement in cardiopulmonary endurance and community ambulation    Baseline  06/10/20: 137535fno AD, no LOB, Rt foot slap without flat foot strike; 07/07/19: test terminated 2/2 fall; 07/09/19: 1565' with R AFO, no assistive device, mask donned; 08/27/19: 1615' with R AFO, no assistive, mask donned    Time  12    Period  Weeks    Status  Partially Met    Target Date  11/19/19      PT LONG TERM GOAL #5   Title  Pt will improve FGA by at least 4 points in order to demonstrate clinically significant improvement in balance and decreased risk for falls.    Baseline  08/27/19; 25/30    Time  12    Period  Weeks    Status  New    Target Date  11/19/19            Plan - 10/01/19 1705    Clinical Impression Statement  Pt presents with unsteadiness on uneven surfaces and fatigues with therapeutic exercises. Patient needs assist with single leg activities and needs CGA assist with dynamic standing  activities. Patient demonstrates difficulty with reaching and narrow base of support with decreased base of support and increased challenges for UE.  Patient tolerated all interventions well this date and will benefit from continued skilled PT interventions to improve strength and balance and decrease risk of  falling   Personal Factors and Comorbidities  Age;Comorbidity 3+;Time since onset of injury/illness/exacerbation    Comorbidities  Urothelial cancer, DM, previous lumbar disc bulge, interstitial pulmonary disease    Examination-Activity Limitations  Locomotion Level;Stairs;Transfers     Examination-Participation Restrictions  Community Activity;Laundry;Shop    Stability/Clinical Decision Making  Evolving/Moderate complexity    Rehab Potential  Fair    PT Frequency  2x / week    PT Duration  12 weeks    PT Treatment/Interventions  ADLs/Self Care Home Management;Aquatic Therapy;Biofeedback;Canalith Repostioning;Cryotherapy;Iontophoresis '4mg'$ /ml Dexamethasone;Moist Heat;Traction;Ultrasound;DME Instruction;Gait training;Stair training;Functional mobility training;Therapeutic activities;Therapeutic exercise;Balance training;Neuromuscular re-education;Patient/family education;Orthotic Fit/Training;Manual techniques;Dry needling;Energy conservation;Vestibular;Joint Manipulations    PT Next Visit Plan  Continue with strengthening and balance    PT Home Exercise Plan  Access Code: [email or web address]    Consulted and Agree with Plan of Care  Patient       Patient will benefit from skilled therapeutic intervention in order to improve the following deficits and impairments:  Abnormal gait, Decreased activity tolerance, Decreased balance, Decreased strength  Visit Diagnosis: Unsteadiness on feet  Muscle weakness (generalized)     Problem List Patient Active Problem List   Diagnosis Date Noted  . Chest pain 03/04/2019  . Anemia due to antineoplastic chemotherapy 03/03/2018  . Cough 03/03/2018  . Chemotherapy induced neutropenia ([place]) 01/22/2018  . B12 deficiency 01/01/2018  . Hypomagnesemia 12/21/2017  . Encounter for antineoplastic chemotherapy 12/21/2017  . Malignant neoplasm of kidney ([place])   . Urothelial cancer ([place]) 12/18/2017  . Malnutrition of moderate degree 12/18/2017  . Therapeutic opioid induced constipation   . Palliative care encounter   . Dehydration 12/17/2017  . Goals of care, counseling/discussion 12/13/2017  . Urothelial carcinoma of kidney, left ([place]) 12/07/2017  . Right inguinal hernia 07/17/2015  . Family history of colon cancer 07/17/2015  . Allergic  rhinitis 11/12/2014  . Airway hyperreactivity 11/12/2014  . Atherosclerosis of coronary artery 11/12/2014  . Cheilitis 11/12/2014  . Narrowing of intervertebral disc space 11/12/2014  . Deflected nasal septum 11/12/2014  . HLD (hyperlipidemia) 11/12/2014  . BP (high blood pressure) 11/12/2014  . Adult hypothyroidism 11/12/2014  . Sleep apnea 11/12/2014  . Diabetes mellitus, type 2 ([place]) 11/12/2014  . Degenerative disc disease, lumbar 09/24/2012  . Furunculosis 04/23/2012    Alanson Puls, PT DPT 10/01/2019, 5:06 PM  [place] MAIN [facility] SERVICES [address] [place], [place], [address] Phone: [phone]   Fax:  [phone]  Name: Patrick Mcgee MRN: [id number] Date of Birth: 1945-11-11

## 2019-10-06 ENCOUNTER — Ambulatory Visit: Payer: Medicare Other

## 2019-10-06 ENCOUNTER — Ambulatory Visit: Payer: Medicare Other | Admitting: Physical Therapy

## 2019-10-06 NOTE — Patient Instructions (Incomplete)
Physical Therapy Progress Note ? ? ?Dates of reporting period  08/26/18   to   10/06/18 ? ? ?TREATMENT ?? ? ?Ther-ex? ?Updated outcome measures with patient including: ?ABC: 65% (65) ?BERG: 53/56 (54/56); ?DGI: 24/24 (23/24) ?FGA: 25/30 (never previously performed) ?30s Sit to Stand Test: 14.5 reps (13 reps) ?6MWT: 1615 with AFO on RLE and mask donned ?? ?? ?Outcome measures and goals updated with patient today. Pt demonstrates consistent scores on BERG and DGI today compared to when they were last updated. Performed FGA with patient today who scored 25/30 with difficulty performed eyes closed ambulation as well as tandem gait. His single leg balance is slightly worse today compared to when it was last tested. His 6MWT has improved to 1615' today with an AFO on his RLE and a mask donned. This is improved from his last test of 1565' and his initiat test of 1375.' His balance confidence remains below the cut-off at 65% which is unchanged from when it was last updated. His 30s Sit to Stand Test has improved to 14.5 repetitions compared to 10 repetitions 06/02/19. Pt is making excellent progress and?will benefit from additional PT services to address deficits in strength, balance, and mobility in order to return to full function at home as full benefit has not yet been achieved. ? ? ?Neuromuscular Re-education? ?? ?Tandem stand?on foam , with trunk rotation without UE support x?2?lengths ?Side stepping on foam  without UE support x 2 lengths ?Heel/toe raises without UE support 3s hold x 10 each ?1/2 foam roll balance with flat side up 30s?x 2 reps ?1/2 foam roll balance with flat side down 30s?x 2 reps ?1/2 foam roll tandem balance alternating forward LE 30s x 2 each?LE forward ?Lateral side steps from foam to 6 inch stool left and right x 15 ?Backwards stepping from foam to 6 inch stool x 15  ?Leg press x 100 lbs x 20 x 20 ?Matrix x 22. 5 lbs fwd/bwd, side to side x 3 reps ?? ?? ? Pt educated throughout session about proper posture and technique with exercises. Improved

## 2019-10-07 ENCOUNTER — Inpatient Hospital Stay: Payer: Medicare Other

## 2019-10-07 ENCOUNTER — Other Ambulatory Visit: Payer: Self-pay

## 2019-10-07 ENCOUNTER — Inpatient Hospital Stay (HOSPITAL_BASED_OUTPATIENT_CLINIC_OR_DEPARTMENT_OTHER): Payer: Medicare Other | Admitting: Oncology

## 2019-10-07 ENCOUNTER — Inpatient Hospital Stay (HOSPITAL_BASED_OUTPATIENT_CLINIC_OR_DEPARTMENT_OTHER): Payer: Medicare Other | Admitting: Hospice and Palliative Medicine

## 2019-10-07 ENCOUNTER — Encounter: Payer: Self-pay | Admitting: Oncology

## 2019-10-07 VITALS — BP 94/74 | HR 78 | Temp 94.2°F | Ht 68.0 in | Wt 157.0 lb

## 2019-10-07 DIAGNOSIS — C642 Malignant neoplasm of left kidney, except renal pelvis: Secondary | ICD-10-CM

## 2019-10-07 DIAGNOSIS — D701 Agranulocytosis secondary to cancer chemotherapy: Secondary | ICD-10-CM

## 2019-10-07 DIAGNOSIS — I25119 Atherosclerotic heart disease of native coronary artery with unspecified angina pectoris: Secondary | ICD-10-CM | POA: Diagnosis not present

## 2019-10-07 DIAGNOSIS — R945 Abnormal results of liver function studies: Secondary | ICD-10-CM | POA: Diagnosis not present

## 2019-10-07 DIAGNOSIS — Z95828 Presence of other vascular implants and grafts: Secondary | ICD-10-CM

## 2019-10-07 DIAGNOSIS — Z5112 Encounter for antineoplastic immunotherapy: Secondary | ICD-10-CM | POA: Diagnosis not present

## 2019-10-07 DIAGNOSIS — G620 Drug-induced polyneuropathy: Secondary | ICD-10-CM | POA: Diagnosis not present

## 2019-10-07 DIAGNOSIS — T451X5A Adverse effect of antineoplastic and immunosuppressive drugs, initial encounter: Secondary | ICD-10-CM | POA: Diagnosis not present

## 2019-10-07 DIAGNOSIS — Z5111 Encounter for antineoplastic chemotherapy: Secondary | ICD-10-CM

## 2019-10-07 DIAGNOSIS — Z515 Encounter for palliative care: Secondary | ICD-10-CM | POA: Diagnosis not present

## 2019-10-07 DIAGNOSIS — R7989 Other specified abnormal findings of blood chemistry: Secondary | ICD-10-CM

## 2019-10-07 LAB — CBC WITH DIFFERENTIAL/PLATELET
Abs Immature Granulocytes: 0.01 10*3/uL (ref 0.00–0.07)
Basophils Absolute: 0.1 10*3/uL (ref 0.0–0.1)
Basophils Relative: 1 %
Eosinophils Absolute: 0.6 10*3/uL — ABNORMAL HIGH (ref 0.0–0.5)
Eosinophils Relative: 9 %
HCT: 39.1 % (ref 39.0–52.0)
Hemoglobin: 12.9 g/dL — ABNORMAL LOW (ref 13.0–17.0)
Immature Granulocytes: 0 %
Lymphocytes Relative: 25 %
Lymphs Abs: 1.7 10*3/uL (ref 0.7–4.0)
MCH: 28.7 pg (ref 26.0–34.0)
MCHC: 33 g/dL (ref 30.0–36.0)
MCV: 86.9 fL (ref 80.0–100.0)
Monocytes Absolute: 0.6 10*3/uL (ref 0.1–1.0)
Monocytes Relative: 9 %
Neutro Abs: 3.7 10*3/uL (ref 1.7–7.7)
Neutrophils Relative %: 56 %
Platelets: 204 10*3/uL (ref 150–400)
RBC: 4.5 MIL/uL (ref 4.22–5.81)
RDW: 15.9 % — ABNORMAL HIGH (ref 11.5–15.5)
WBC: 6.7 10*3/uL (ref 4.0–10.5)
nRBC: 0 % (ref 0.0–0.2)

## 2019-10-07 LAB — COMPREHENSIVE METABOLIC PANEL
ALT: 138 U/L — ABNORMAL HIGH (ref 0–44)
AST: 70 U/L — ABNORMAL HIGH (ref 15–41)
Albumin: 4.2 g/dL (ref 3.5–5.0)
Alkaline Phosphatase: 164 U/L — ABNORMAL HIGH (ref 38–126)
Anion gap: 8 (ref 5–15)
BUN: 20 mg/dL (ref 8–23)
CO2: 26 mmol/L (ref 22–32)
Calcium: 9.7 mg/dL (ref 8.9–10.3)
Chloride: 103 mmol/L (ref 98–111)
Creatinine, Ser: 0.81 mg/dL (ref 0.61–1.24)
GFR calc Af Amer: >60 mL/min (ref >60)
GFR calc non Af Amer: >60 mL/min (ref >60)
Glucose, Bld: 166 mg/dL — ABNORMAL HIGH (ref 70–99)
Potassium: 4 mmol/L (ref 3.5–5.1)
Sodium: 137 mmol/L (ref 135–145)
Total Bilirubin: 1 mg/dL (ref 0.3–1.2)
Total Protein: 7.4 g/dL (ref 6.5–8.1)

## 2019-10-07 MED ORDER — SODIUM CHLORIDE 0.9 % IV SOLN
Freq: Once | INTRAVENOUS | Status: AC
  Filled 2019-10-07: qty 250

## 2019-10-07 MED ORDER — SODIUM CHLORIDE 0.9% FLUSH
10.0000 mL | Freq: Once | INTRAVENOUS | Status: AC
  Administered 2019-10-07: 10 mL via INTRAVENOUS
  Filled 2019-10-07: qty 10

## 2019-10-07 MED ORDER — HEPARIN SOD (PORK) LOCK FLUSH 100 UNIT/ML IV SOLN
INTRAVENOUS | Status: AC
  Filled 2019-10-07: qty 5

## 2019-10-07 MED ORDER — SODIUM CHLORIDE 0.9 % IV SOLN
1.0000 mg/kg | Freq: Once | INTRAVENOUS | Status: AC
  Administered 2019-10-07: 70 mg via INTRAVENOUS
  Filled 2019-10-07: qty 3

## 2019-10-07 MED ORDER — HEPARIN SOD (PORK) LOCK FLUSH 100 UNIT/ML IV SOLN
500.0000 [IU] | Freq: Once | INTRAVENOUS | Status: AC
  Administered 2019-10-07: 500 [IU] via INTRAVENOUS
  Filled 2019-10-07: qty 5

## 2019-10-07 MED ORDER — PALONOSETRON HCL INJECTION 0.25 MG/5ML
0.2500 mg | Freq: Once | INTRAVENOUS | Status: AC
  Administered 2019-10-07: 11:00:00 0.25 mg via INTRAVENOUS
  Filled 2019-10-07: qty 5

## 2019-10-07 MED ORDER — HEPARIN SOD (PORK) LOCK FLUSH 100 UNIT/ML IV SOLN
500.0000 [IU] | Freq: Once | INTRAVENOUS | Status: DC | PRN
  Filled 2019-10-07: qty 5

## 2019-10-07 NOTE — Progress Notes (Signed)
[place]  [phone] [phone]   Name: Patrick Mcgee Date: 10/07/2019 MRN: [id number]  DOB: 1946-07-04  Patient Care Team: Jerrol Banana., MD as PCP - General (Family Medicine) Dingeldein, [name], MD as Consulting Physician (Ophthalmology) [name] as Consulting Physician (Internal Medicine) [name], MD as Consulting Physician (Pulmonary Disease) Sindy Guadeloupe, MD as Consulting Physician (Oncology)    REASON FOR CONSULTATION: 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 PMH is also notable for mild interstitial lung disease, diabetes, and CAD status post previous stenting. CT of the chest, abdomen, and pelvis on 07/08/2019 revealed significant disease improvement. Patient was referred to palliative care to help address goals and manage ongoing symptoms.  SOCIAL HISTORY:     reports that he quit smoking about 41 years ago. His smoking use included cigars. He quit smokeless tobacco use about 30 years ago.  His smokeless tobacco use included chew. He reports previous alcohol use. He reports that he does not use drugs.   Patient is married and lives at home with his wife.  He has a daughter who lives nearby.  Patient formally worked at [employer] in [place]:  On file dated 12/28/2017  CODE STATUS: DNR (on file)  PAST MEDICAL HISTORY: Past Medical History:  Diagnosis Date  . Acid reflux   . Anxiety   . Depression   . Diabetes mellitus without complication ([place])   . Hyperlipidemia   . Hypertension   . MRSA (methicillin resistant Staphylococcus aureus) infection 1992   history  . Myocardial infarction ([place]) 1995  . Sleep apnea    CPAP  . Urothelial cancer ([place]) 11/2017   Left Urothelial mass,  chemo tx's    PAST SURGICAL HISTORY:  Past Surgical History:  Procedure Laterality Date  . CATARACT EXTRACTION Left   . COLONOSCOPY  2010   [facility]  . COLONOSCOPY WITH PROPOFOL N/A 10/04/2016   Procedure: COLONOSCOPY WITH PROPOFOL;  Surgeon: [name], MD;  Location: [facility] ENDOSCOPY;  Service: Endoscopy;  Laterality: N/A;  . [place], 2000, 2001, 2014  . CYSTOSCOPY W/ RETROGRADES Bilateral 12/10/2017   Procedure: CYSTOSCOPY WITH RETROGRADE PYELOGRAM;  Surgeon: [name], MD;  Location: [facility] ORS;  Service: Urology;  Laterality: Bilateral;  . CYSTOSCOPY W/ RETROGRADES Left 12/09/2018   Procedure: CYSTOSCOPY WITH RETROGRADE PYELOGRAM;  Surgeon: [name], MD;  Location: [facility] ORS;  Service: Urology;  Laterality: Left;  . CYSTOSCOPY WITH BIOPSY Left 12/09/2018   Procedure: CYSTOSCOPY WITH URETERAL/RENAL PELVIC BIOPSY;  Surgeon: [name], MD;  Location: [facility] ORS;  Service: Urology;  Laterality: Left;  . CYSTOSCOPY WITH STENT PLACEMENT Left 12/10/2017   Procedure: CYSTOSCOPY WITH STENT PLACEMENT;  Surgeon: [name], MD;  Location: [facility] ORS;  Service: Urology;  Laterality: Left;  . CYSTOSCOPY WITH STENT PLACEMENT Left 12/09/2018   Procedure: CYSTOSCOPY WITH STENT PLACEMENT;  Surgeon: [name], MD;  Location: [facility] ORS;  Service: Urology;  Laterality: Left;  . CYSTOSCOPY WITH URETEROSCOPY Left 12/09/2018   Procedure: CYSTOSCOPY WITH URETEROSCOPY;  Surgeon: [name], MD;  Location: [facility] ORS;  Service: Urology;  Laterality: Left;  . EYE SURGERY Bilateral    cataract  . INGUINAL HERNIA REPAIR Right 08/09/2015   Procedure: HERNIA REPAIR INGUINAL ADULT;  Surgeon: [name], MD;  Location: [facility] ORS;  Service: General;  Laterality: Right;  . NASAL SINUS SURGERY    . nuclear stress test    . PORTA CATH INSERTION N/A 12/19/2017   Procedure: PORTA CATH INSERTION;  Surgeon: [name], MD;  Location: [place] CV LAB;   Service: Cardiovascular;  Laterality: N/A;  . URETERAL BIOPSY Left 12/10/2017   Procedure: URETERAL & renal PELVIS BIOPSY;  Surgeon: [name], MD;  Location: [facility] ORS;  Service: Urology;  Laterality: Left;  . URETEROSCOPY Left 12/10/2017   Procedure: URETEROSCOPY;  Surgeon: [name], MD;  Location: [facility] ORS;  Service: Urology;  Laterality: Left;    HEMATOLOGY/ONCOLOGY HISTORY:  Oncology History Overview Note  Patrick Mcgee is a 74 y.o. 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  He presented with abdominal pain and weight loss.  Abdomen and pelvic CT on 12/01/2017 revealed a suspicious enhancing, filling defect involving the lower pole collecting system of the left kidney with asymmetric hypoenhancement of the inferior pole cortex of left kidney noted. Findings were worrisome for urothelial neoplasm.  There was abdominal and pelvic adenopathy compatible with nodal metastasis.  There was a 2.6 cm right para celiac node, 1,6 cm left retroperitoneal node, 1.4 cm right retroperitoneal node, 2.1 cm right common iliac node and 1 cm left external iliac node.  There was aortic atherosclerosis, kidney cysts, and prostate gland enlargement.  Chest CT on 12/07/2017 revealed no evidence of metastatic disease.    He underwent left ureteroscopy with renal pelvis biopsy and left ureteral stent placement on 12/10/2017.  Findings included a large nodular, vasculartumor within the left lower pole collecting system completely obstructing the entire left lower pole moiety. Ureter, right kidney, and bladder was normal.  Pathology revealed small fragments of high-grade urothelial carcinoma with a small focus of invasion.  He has diabetes and coronary artery disease s/p MI in 1995.  He is s/p stent plaecment x 5-6. Colonoscopy in 2018 was negative.      Urothelial carcinoma of kidney, left ([place])  12/07/2017 Initial Diagnosis   Urothelial carcinoma of  kidney, left ([place])   12/17/2017 - 05/27/2018 Chemotherapy   The patient had palonosetron (ALOXI) injection 0.25 mg, 0.25 mg, Intravenous,  Once, 8 of 10 cycles Administration: 0.25 mg (12/21/2017), 0.25 mg (01/14/2018), 0.25 mg (02/04/2018), 0.25 mg (02/25/2018), 0.25 mg (03/18/2018), 0.25 mg (04/08/2018), 0.25 mg (04/29/2018), 0.25 mg (05/27/2018) CARBOplatin (PARAPLATIN) 390 mg in sodium chloride 0.9 % 250 mL chemo infusion, 370 mg (100 % of original dose 370.4 mg), Intravenous,  Once, 8 of 10 cycles Dose modification:   (original dose 370.4 mg, Cycle 1, Reason: Provider Judgment),   (original dose 370.4 mg, Cycle 2, Reason: Other (see comments), Comment: Continue same dose as last cycle) Administration: 390 mg (12/21/2017), 390 mg (01/14/2018), 370 mg (02/04/2018), 370 mg (02/25/2018), 370 mg (03/18/2018), 370 mg (04/08/2018), 370 mg (04/29/2018), 370 mg (05/27/2018) gemcitabine (GEMZAR) 1,600 mg in sodium chloride 0.9 % 250 mL chemo infusion, 1,482 mg (80 % of original dose 1,000 mg/m2), Intravenous,  Once, 8 of 10 cycles Dose modification: 800 mg/m2 (original dose 1,000 mg/m2, Cycle 1, Reason: Provider Judgment), 800 mg/m2 (original dose 1,000 mg/m2, Cycle 2, Reason: Other (see comments), Comment: same dose as last cycle), 850 mg/m2 (original dose 1,000 mg/m2, Cycle 3, Reason: Provider Judgment, Comment: prior dose tolerated), 700 mg/m2 (original dose 1,000 mg/m2, Cycle 7, Reason: Provider Judgment), 750 mg/m2 (original dose 1,000 mg/m2, Cycle 7, Reason: Provider Judgment) Administration: 1,600 mg (12/21/2017), 1,600  mg (12/31/2017), 1,600 mg (01/14/2018), 1,600 mg (01/21/2018), 1,600 mg (02/04/2018), 1,600 mg (02/11/2018), 1,600 mg (02/25/2018), 1,600 mg (03/18/2018), 1,600 mg (04/08/2018), 1,600 mg (04/15/2018), 1,600 mg (04/29/2018), 1,400 mg (05/06/2018), 1,400 mg (05/27/2018) ondansetron (ZOFRAN) 8 mg in sodium chloride 0.9 % 50 mL IVPB, , Intravenous,  Once, 5 of 7 cycles Administration:  (01/21/2018)  for chemotherapy  treatment.    01/14/2019 -  Chemotherapy   The patient had palonosetron (ALOXI) injection 0.25 mg, 0.25 mg, Intravenous,  Once, 9 of 9 cycles Administration: 0.25 mg (01/14/2019), 0.25 mg (02/11/2019), 0.25 mg (01/21/2019), 0.25 mg (01/28/2019), 0.25 mg (02/25/2019), 0.25 mg (03/11/2019), 0.25 mg (03/18/2019), 0.25 mg (04/22/2019), 0.25 mg (03/25/2019), 0.25 mg (04/29/2019), 0.25 mg (05/20/2019), 0.25 mg (05/28/2019), 0.25 mg (06/17/2019), 0.25 mg (07/18/2019), 0.25 mg (07/28/2019), 0.25 mg (08/12/2019), 0.25 mg (09/09/2019) enfortumab vedotin-ejfv (PADCEV) 70 mg in sodium chloride 0.9 % 50 mL (1.2281 mg/mL) chemo infusion, 1 mg/kg = 70 mg (100 % of original dose 1 mg/kg), Intravenous,  Once, 9 of 9 cycles Dose modification: 1 mg/kg (original dose 1 mg/kg, Cycle 1, Reason: Provider Judgment) Administration: 70 mg (01/14/2019), 70 mg (02/11/2019), 70 mg (01/21/2019), 70 mg (01/28/2019), 70 mg (02/25/2019), 70 mg (03/11/2019), 70 mg (03/18/2019), 70 mg (04/22/2019), 70 mg (03/25/2019), 70 mg (04/29/2019), 70 mg (05/20/2019), 70 mg (05/28/2019), 70 mg (06/17/2019), 70 mg (07/18/2019), 70 mg (07/28/2019), 70 mg (08/12/2019), 70 mg (09/09/2019)  for chemotherapy treatment.    Urothelial cancer ([place])  12/18/2017 Initial Diagnosis   Urothelial cancer ([place])   06/17/2018 - 01/14/2019 Chemotherapy   The patient had atezolizumab (TECENTRIQ) 1,200 mg in sodium chloride 0.9 % 250 mL chemo infusion, 1,200 mg, Intravenous, Once, 10 of 10 cycles Administration: 1,200 mg (06/17/2018), 1,200 mg (07/05/2018), 1,200 mg (07/29/2018), 1,200 mg (08/19/2018), 1,200 mg (09/30/2018), 1,200 mg (09/09/2018), 1,200 mg (10/21/2018), 1,200 mg (11/11/2018), 1,200 mg (12/03/2018), 1,200 mg (12/24/2018)  for chemotherapy treatment.      ALLERGIES:  is allergic to sulfa antibiotics; ace inhibitors; invokana [canagliflozin]; and penicillins.  MEDICATIONS:  Current Outpatient Medications  Medication Sig Dispense Refill  . aspirin EC 81 MG tablet Take 81 mg by mouth every evening.      Marland Kitchen atorvastatin (LIPITOR) 40 MG tablet TAKE 1 TABLET BY MOUTH AT BEDTIME 90 tablet 4  . Cholecalciferol (VITAMIN D3) 75 MCG (3000 UT) TABS Take by mouth.    . clobetasol cream (TEMOVATE) [id number] % Apply 1 application topically as needed (for skin rash). 30 g 1  . fluticasone (FLONASE) 50 MCG/ACT nasal spray Use 2 spray(s) in each nostril once daily 48 g 3  . gabapentin (NEURONTIN) 300 MG capsule Take 1 capsule (300 mg total) by mouth 3 (three) times daily. 270 capsule 0  . glucose blood (CONTOUR NEXT TEST) test strip Check blood sugars 3 times daily. 300 each 11  . HYDROcodone-acetaminophen (NORCO/VICODIN) 5-325 MG tablet Take 1 tablet by mouth 1 day or 1 dose.    . hydrOXYzine (ATARAX/VISTARIL) 25 MG tablet Take 1 tablet (25 mg total) by mouth every 8 (eight) hours as needed for itching. 30 tablet 3  . Ivermectin (SOOLANTRA) 1 % CREA Apply 1 application topically daily as needed (rosacea). To face    . JARDIANCE 10 MG TABS tablet Take 1 tablet by mouth once daily 30 tablet 11  . lidocaine-prilocaine (EMLA) cream Apply 1 application topically as needed (port access). 1 g 3  . loratadine (CLARITIN) 10 MG tablet Take 10 mg by mouth daily as needed for allergies. [employer] brand  allergy relief    . losartan (COZAAR) 50 MG tablet Take 1 tablet (50 mg total) by mouth daily. 90 tablet 3  . magic mouthwash w/lidocaine SOLN Take 5 mLs by mouth 4 (four) times daily as needed for mouth pain. 480 mL 3  . magnesium hydroxide (MILK OF MAGNESIA) 400 MG/5ML suspension Take 15 mLs by mouth daily as needed for mild constipation.    . metFORMIN (GLUCOPHAGE) 1000 MG tablet TAKE 1 TABLET BY MOUTH TWICE DAILY WITH MEALS 180 tablet 1  . nystatin cream (MYCOSTATIN) Apply topically 2 (two) times daily. 15 g 1  . OLANZapine (ZYPREXA) 10 MG tablet Take 1 tablet (10 mg total) by mouth at bedtime. 30 tablet 1  . OVER THE COUNTER MEDICATION Apply 1 application topically daily as needed (pain). Outback topical pain relief    .  oxybutynin (DITROPAN) 5 MG tablet Take 1 tablet by mouth 1 day or 1 dose.    . sertraline (ZOLOFT) 100 MG tablet Take 1 tablet (100 mg total) by mouth daily. 90 tablet 3  . vitamin B-12 (CYANOCOBALAMIN) 1000 MCG tablet Take by mouth daily. Unsure dose    . vitamin C (ASCORBIC ACID) 500 MG tablet Take 1,000-1,500 mg by mouth daily as needed (immune support).    Marland Kitchen VITAMIN D PO Take 1 tablet by mouth 1 day or 1 dose.     No current facility-administered medications for this visit.   Facility-Administered Medications Ordered in Other Visits  Medication Dose Route Frequency Provider Last Rate Last Admin  . 0.9 %  sodium chloride infusion   Intravenous Once Sindy Guadeloupe, MD 999 mL/hr at 10/07/19 1105 New Bag at 10/07/19 1105  . 0.9 %  sodium chloride infusion   Intravenous Once Sindy Guadeloupe, MD      . enfortumab vedotin-ejfv (PADCEV) 70 mg in sodium chloride 0.9 % 50 mL (1.2281 mg/mL) chemo infusion  1 mg/kg (Treatment Plan Recorded) Intravenous Once Sindy Guadeloupe, MD      . heparin lock flush 100 unit/mL  500 Units Intravenous Once Sindy Guadeloupe, MD      . heparin lock flush 100 unit/mL  500 Units Intracatheter Once PRN Sindy Guadeloupe, MD      . Influenza vac split quadrivalent PF (FLUZONE HIGH-DOSE) injection 0.5 mL  0.5 mL Intramuscular Once Karen Kitchens, NP      . palonosetron (ALOXI) injection 0.25 mg  0.25 mg Intravenous Once Sindy Guadeloupe, MD        VITAL SIGNS: There were no vitals taken for this visit. There were no vitals filed for this visit.  Estimated body mass index is 23.87 kg/m as calculated from the following:   Height as of an earlier encounter on 10/07/19: 5\' 8"  (1.727 m).   Weight as of an earlier encounter on 10/07/19: 157 lb (71.2 kg).  LABS: CBC:    Component Value Date/Time   WBC 6.7 10/07/2019 0955   HGB 12.9 (L) 10/07/2019 0955   HGB 13.7 03/15/2017 1042   HCT 39.1 10/07/2019 0955   HCT 39.7 03/15/2017 1042   PLT 204 10/07/2019 0955   PLT 270 03/15/2017  1042   MCV 86.9 10/07/2019 0955   MCV 89 03/15/2017 1042   MCV 89 02/03/2013 2223   NEUTROABS 3.7 10/07/2019 0955   NEUTROABS 4.6 03/15/2017 1042   LYMPHSABS 1.7 10/07/2019 0955   LYMPHSABS 1.2 03/15/2017 1042   MONOABS 0.6 10/07/2019 0955   EOSABS 0.6 (H) 10/07/2019 [id number]  EOSABS 0.3 03/15/2017 1042   BASOSABS 0.1 10/07/2019 0955   BASOSABS 0.0 03/15/2017 1042   Comprehensive Metabolic Panel:    Component Value Date/Time   NA 137 10/07/2019 0955   NA 137 03/15/2017 1042   NA 137 02/03/2013 2223   K 4.0 10/07/2019 0955   K 4.0 02/03/2013 2223   CL 103 10/07/2019 0955   CL 106 02/03/2013 2223   CO2 26 10/07/2019 0955   CO2 28 02/03/2013 2223   BUN 20 10/07/2019 0955   BUN 9 03/15/2017 1042   BUN 11 02/03/2013 2223   CREATININE 0.81 10/07/2019 0955   CREATININE 0.69 02/03/2013 2223   GLUCOSE 166 (H) 10/07/2019 0955   GLUCOSE 158 (H) 02/03/2013 2223   CALCIUM 9.7 10/07/2019 0955   CALCIUM 9.3 02/03/2013 2223   AST 70 (H) 10/07/2019 0955   AST 26 02/03/2013 2223   ALT 138 (H) 10/07/2019 0955   ALT 30 02/03/2013 2223   ALKPHOS 164 (H) 10/07/2019 0955   ALKPHOS 121 02/03/2013 2223   BILITOT 1.0 10/07/2019 0955   BILITOT 1.5 (H) 03/15/2017 1042   BILITOT 1.1 (H) 02/03/2013 2223   PROT 7.4 10/07/2019 0955   PROT 6.9 03/15/2017 1042   PROT 7.5 02/03/2013 2223   ALBUMIN 4.2 10/07/2019 0955   ALBUMIN 4.3 03/15/2017 1042   ALBUMIN 3.3 (L) 02/03/2013 2223    RADIOGRAPHIC STUDIES: No results found.  PERFORMANCE STATUS (ECOG) : 2 - Symptomatic, <50% confined to bed  Review of Systems Unless otherwise noted, a complete review of systems is negative.  Physical Exam General: NAD, frail appearing, thin Pulmonary: Unlabored Extremities: no edema, no joint deformities Skin: no rashes Neurological: Weakness but otherwise nonfocal  IMPRESSION: Routine follow-up visit today.  Patient was seen in the infusion area.  Patient reports that he is doing reasonably well.  He  says that he had an episode of nausea and vomiting lasting a couple days after eating a sandwich over the weekend.  However, this is now resolved.  Patient says he remains a bit weak but is working with PT.  He also endorses daytime drowsiness, which she attributes to his gabapentin.  He does say that his peripheral neuropathy is significantly improving through acupuncture.  We discussed the possibility of dose reduction of his gabapentin if it is attributing to his fatigue.  Otherwise, patient denies any significant changes or concerns today.  Plan is for repeat CT on 4/16  PLAN: -Continue current scope of treatment -RTC in 1 month   Patient expressed understanding and was in agreement with this plan. He also understands that He can call the clinic at any time with any questions, concerns, or complaints.     Time Total: 20 minutes  Visit consisted of counseling and education dealing with the complex and emotionally intense issues of symptom management and palliative care in the setting of serious and potentially life-threatening illness.Greater than 50%  of this time was spent counseling and coordinating care related to the above assessment and plan.  Signed by: Altha Harm, PhD, NP-C [phone] (Work Cell)

## 2019-10-07 NOTE — Progress Notes (Signed)
Patient stated that he had been feeling well until last Friday he ate street food and he felt bad with nausea but no vomiting.

## 2019-10-07 NOTE — Progress Notes (Signed)
Per Dr. Janese Banks pt to receive one liter of NS (see MAR).  AST 70, ALT 138, Alkaline Phosphatase 164 Per Dr. Janese Banks okay to proceed with treatment as scheduled at this time.

## 2019-10-08 ENCOUNTER — Ambulatory Visit: Payer: Medicare Other | Admitting: Physical Therapy

## 2019-10-08 ENCOUNTER — Encounter: Payer: Self-pay | Admitting: Physical Therapy

## 2019-10-08 DIAGNOSIS — R2681 Unsteadiness on feet: Secondary | ICD-10-CM | POA: Diagnosis not present

## 2019-10-08 DIAGNOSIS — M6281 Muscle weakness (generalized): Secondary | ICD-10-CM | POA: Diagnosis not present

## 2019-10-08 NOTE — Therapy (Signed)
[place] MAIN [facility] SERVICES [address] [place], [place], [address] Phone: [phone]   Fax:  [phone]  Physical Therapy Treatment Physical Therapy Progress Note   Dates of reporting period 08/27/19   to  10/08/19  Patient Details  Name: Patrick Mcgee MRN: [id number] Date of Birth: Oct 28, 1945 Referring Provider (PT): Dr. Rosanna Randy   Encounter Date: 10/08/2019  PT End of Session - 10/08/19 1708    Visit Number  30    Number of Visits  49    Date for PT Re-Evaluation  11/19/19    PT Start Time  1700    PT Stop Time  1740    PT Time Calculation (min)  40 min    Equipment Utilized During Treatment  Gait belt    Activity Tolerance  Patient tolerated treatment well;No increased pain    Behavior During Therapy  WFL for tasks assessed/performed       Past Medical History:  Diagnosis Date  . Acid reflux   . Anxiety   . Depression   . Diabetes mellitus without complication ([place])   . Hyperlipidemia   . Hypertension   . MRSA (methicillin resistant Staphylococcus aureus) infection 1992   history  . Myocardial infarction ([place]) 1995  . Sleep apnea    CPAP  . Urothelial cancer ([place]) 11/2017   Left Urothelial mass, chemo tx's    Past Surgical History:  Procedure Laterality Date  . CATARACT EXTRACTION Left   . COLONOSCOPY  2010   [facility]  . COLONOSCOPY WITH PROPOFOL N/A 10/04/2016   Procedure: COLONOSCOPY WITH PROPOFOL;  Surgeon: [name], MD;  Location: [facility] ENDOSCOPY;  Service: Endoscopy;  Laterality: N/A;  . [place], 2000, 2001, 2014  . CYSTOSCOPY W/ RETROGRADES Bilateral 12/10/2017   Procedure: CYSTOSCOPY WITH RETROGRADE PYELOGRAM;  Surgeon: [name], MD;  Location: [facility] ORS;  Service: Urology;  Laterality: Bilateral;  . CYSTOSCOPY W/ RETROGRADES Left 12/09/2018   Procedure: CYSTOSCOPY WITH RETROGRADE PYELOGRAM;  Surgeon: [name], MD;  Location: [facility] ORS;  Service:  Urology;  Laterality: Left;  . CYSTOSCOPY WITH BIOPSY Left 12/09/2018   Procedure: CYSTOSCOPY WITH URETERAL/RENAL PELVIC BIOPSY;  Surgeon: [name], MD;  Location: [facility] ORS;  Service: Urology;  Laterality: Left;  . CYSTOSCOPY WITH STENT PLACEMENT Left 12/10/2017   Procedure: CYSTOSCOPY WITH STENT PLACEMENT;  Surgeon: [name], MD;  Location: [facility] ORS;  Service: Urology;  Laterality: Left;  . CYSTOSCOPY WITH STENT PLACEMENT Left 12/09/2018   Procedure: CYSTOSCOPY WITH STENT PLACEMENT;  Surgeon: [name], MD;  Location: [facility] ORS;  Service: Urology;  Laterality: Left;  . CYSTOSCOPY WITH URETEROSCOPY Left 12/09/2018   Procedure: CYSTOSCOPY WITH URETEROSCOPY;  Surgeon: [name], MD;  Location: [facility] ORS;  Service: Urology;  Laterality: Left;  . EYE SURGERY Bilateral    cataract  . INGUINAL HERNIA REPAIR Right 08/09/2015   Procedure: HERNIA REPAIR INGUINAL ADULT;  Surgeon: [name], MD;  Location: [facility] ORS;  Service: General;  Laterality: Right;  . NASAL SINUS SURGERY    . nuclear stress test    . PORTA CATH INSERTION N/A 12/19/2017   Procedure: PORTA CATH INSERTION;  Surgeon: [name], MD;  Location: [place] CV LAB;  Service: Cardiovascular;  Laterality: N/A;  . URETERAL BIOPSY Left 12/10/2017   Procedure: URETERAL & renal PELVIS BIOPSY;  Surgeon: [name], MD;  Location: [facility] ORS;  Service: Urology;  Laterality: Left;  . URETEROSCOPY Left 12/10/2017  Procedure: URETEROSCOPY;  Surgeon: [name], MD;  Location: [facility] ORS;  Service: Urology;  Laterality: Left;    There were no vitals filed for this visit.  Subjective Assessment - 10/08/19 1707    Subjective  Patient was not feeling well after he ate from a take out meal .    Pertinent History  Part of history borrowed from oncology note and verified with patient: Patient is a 74 year old male who was referred for foot drop. He started with Dr. [name] so far for his metastatic urothelial carcinoma.   This was originally diagnosed in May 2019. He was started on chemotherapy in June 2019 and was continued on 05/27/2018 for 8 cycles.  He tolerated chemotherapy well except for chemo-induced anemia for which she has been getting Procrit every 2 weeks.  He did have response to his disease based on scans in August 2019.  However repeat scan on 05/31/2018 showed increase in the size of the primary tumor from 1.2 to 1.9 cm and increase in left para-aortic adenopathy from 0.9 to 1.3 cm.  Second line immunotherapy was recommended.  His initial biopsy specimen did not have enough sample to undergo FGFR mutation testing. He also has mild interstitial lung disease for which he sees pulmonary but he is not on home oxygen.  He has B12 deficiency for which he is on oral B12.  Also has diabetes and coronary artery disease. Patient noted to have disease progression in his lymph nodes in May 2020.  Repeat biopsy showed metastatic urothelial carcinoma which did not have a FGFR mutation.  He has been started on third line Padcev. There is still times when he feels fatigued but he is having more good days. He reports poor appetite. He was working with physical therapy on his foot drop, RLE weakness, and unsteadiness. He states that he gets considerable numbness for the 7-8 days following his chemotherapy treatment. He states that he is a diabetic and did have some numbness prior to starting chemotherapy. He states that he has a history of a low back disc bulge with RLE radicular symptoms. He used to get spinal injections but reports he hasn't had any injections in the last 10 years.    Limitations  Walking    Currently in Pain?  No/denies    Pain Score  0-No pain      Treatment: Patient performs outcome measures for review of goals below. Patient continues to progress with goals # 2 , #4, and # 5. Pt educated throughout session about proper posture and technique with exercises. Improved exercise technique, movement at target  joints, use of target muscles after min to mod verbal, visual, tactile cues.                        PT Education - 10/08/19 1708    Education Details  HEP    Person(s) Educated  Patient    Methods  Explanation    Comprehension  Verbalized understanding;Returned demonstration;Need further instruction       PT Short Term Goals - 08/27/19 1307      PT SHORT TERM GOAL #1   Title  Pt will be independent with HEP in order to improve strength and balance in order to decrease fall risk and improve function at home    Time  6    Period  Weeks    Status  On-going    Target Date  07/14/19        PT  Long Term Goals - 10/08/19 1710      PT LONG TERM GOAL #1   Title  Pt will improve BERG by at least 3 points in order to demonstrate clinically significant improvement in balance.    Time  12    Status  Achieved      PT LONG TERM GOAL #2   Title  Pt will improve ABC by at least 13% in order to demonstrate clinically significant improvement in balance confidence.    Baseline  06/02/19: 64.4%; 07/07/19: 65%; 08/27/19: 65%,, 10/08/19=70%    Time  12    Period  Weeks    Status  On-going    Target Date  11/19/19      PT LONG TERM GOAL #3   Title  Pt will increase 30s Sit to Stand Test to greater than or equal to 14 repetitions in order to demonstrate clinically significant improvement in LE endurance and decrease in fall risk.    Baseline  06/02/19: 10 repetitions; 07/07/19: 13 repetitions; 08/27/19: 14.5 repetitions    Status  Achieved      PT LONG TERM GOAL #4   Title  Pt will increase 6MWT by at least 162m(3244f in order to demonstrate clinically significant improvement in cardiopulmonary endurance and community ambulation    Baseline  06/10/20: 137535fno AD, no LOB, Rt foot slap without flat foot strike; 07/07/19: test terminated 2/2 fall; 07/09/19: 1565' with R AFO, no assistive device, mask donned; 08/27/19: 1615' with R AFO, no assistive, mask donned, 3/31/211600 ft     Time  12    Period  Weeks    Status  Partially Met    Target Date  11/19/19      PT LONG TERM GOAL #5   Title  Pt will improve FGA by at least 4 points in order to demonstrate clinically significant improvement in balance and decreased risk for falls.    Baseline  10/08/19=26/30    Period  Weeks    Status  Partially Met    Target Date  11/19/19            Plan - 10/08/19 1710    Clinical Impression Statement  Patient's condition has the potential to improve in response to therapy. Maximum improvement is yet to be obtained. The anticipated improvement is attainable and reasonable in a generally predictable time.  Patient reports that his right foot continues to slap during fast walking. He is making progress towards goals and is waiting to receive his AFO for right foot. He will continue to benefit from skilled PT to improve mobility and strength..    Personal Factors and Comorbidities  Age;Comorbidity 3+;Time since onset of injury/illness/exacerbation    Comorbidities  Urothelial cancer, DM, previous lumbar disc bulge, interstitial pulmonary disease    Examination-Activity Limitations  Locomotion Level;Stairs;Transfers    Examination-Participation Restrictions  Community Activity;Laundry;Shop    Stability/Clinical Decision Making  Evolving/Moderate complexity    Rehab Potential  Fair    PT Frequency  2x / week    PT Duration  12 weeks    PT Treatment/Interventions  ADLs/Self Care Home Management;Aquatic Therapy;Biofeedback;Canalith Repostioning;Cryotherapy;Iontophoresis '4mg'$ /ml Dexamethasone;Moist Heat;Traction;Ultrasound;DME Instruction;Gait training;Stair training;Functional mobility training;Therapeutic activities;Therapeutic exercise;Balance training;Neuromuscular re-education;Patient/family education;Orthotic Fit/Training;Manual techniques;Dry needling;Energy conservation;Vestibular;Joint Manipulations    PT Next Visit Plan  Continue with strengthening and balance    PT Home  Exercise Plan  Access Code: [email or web address] Consulted and Agree with Plan of Care  Patient       Patient  will benefit from skilled therapeutic intervention in order to improve the following deficits and impairments:  Abnormal gait, Decreased activity tolerance, Decreased balance, Decreased strength  Visit Diagnosis: Unsteadiness on feet  Muscle weakness (generalized)     Problem List Patient Active Problem List   Diagnosis Date Noted  . Chest pain 03/04/2019  . Anemia due to antineoplastic chemotherapy 03/03/2018  . Cough 03/03/2018  . Chemotherapy induced neutropenia ([place]) 01/22/2018  . B12 deficiency 01/01/2018  . Hypomagnesemia 12/21/2017  . Encounter for antineoplastic chemotherapy 12/21/2017  . Malignant neoplasm of kidney ([place])   . Urothelial cancer ([place]) 12/18/2017  . Malnutrition of moderate degree 12/18/2017  . Therapeutic opioid induced constipation   . Palliative care encounter   . Dehydration 12/17/2017  . Goals of care, counseling/discussion 12/13/2017  . Urothelial carcinoma of kidney, left ([place]) 12/07/2017  . Right inguinal hernia 07/17/2015  . Family history of colon cancer 07/17/2015  . Allergic rhinitis 11/12/2014  . Airway hyperreactivity 11/12/2014  . Atherosclerosis of coronary artery 11/12/2014  . Cheilitis 11/12/2014  . Narrowing of intervertebral disc space 11/12/2014  . Deflected nasal septum 11/12/2014  . HLD (hyperlipidemia) 11/12/2014  . BP (high blood pressure) 11/12/2014  . Adult hypothyroidism 11/12/2014  . Sleep apnea 11/12/2014  . Diabetes mellitus, type 2 ([place]) 11/12/2014  . Degenerative disc disease, lumbar 09/24/2012  . Furunculosis 04/23/2012    Alanson Puls, PT DPT 10/08/2019, 5:32 PM  [place] MAIN [facility] SERVICES [address] [place], [place], [address] Phone: [phone]   Fax:  [phone]  Name: EASTEN MACEACHERN MRN: [id number] Date of Birth: 1945-09-07

## 2019-10-09 ENCOUNTER — Other Ambulatory Visit: Payer: Self-pay | Admitting: Oncology

## 2019-10-09 NOTE — Progress Notes (Signed)
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 [name] so far for his metastatic urothelial carcinoma. This was originally diagnosed in May 2019. He was noted to have a filling defect in the lower pole collecting system of the left kidney along with para-aortic and retroperitoneal adenopathy concerning for metastatic disease. He underwent left ureteroscopy and renal pelvis biopsy which revealed small fragments of high-grade urothelial carcinoma with small focus of invasion. He was started on carboplatin and gemcitabine chemotherapy in June 2019 and was continued on 05/27/2018 for 8 cycles. He tolerated chemotherapy well except for chemo-induced anemia for which she has been getting Procrit every 2 weeks. He did have response to his disease based on scans in August 2019. However repeat scan on 05/31/2018 showed increase in the size of the primary tumor from 1.2 to 1.9 cm and increase in left para-aortic adenopathy from 0.9 to 1.3 cm. Periportal adenopathy was stable at 1.4 cm. Second line immunotherapy was recommended. His initial biopsy specimen did not have enough sample to undergo FGFR mutation testing. He also has mild interstitial lung disease for which he sees  pulmonary but he is not on home oxygen. He has B12 deficiency for which he is on oral B12. Also has diabetes and coronary artery disease.Tecentriq started on 06/17/2018.Patient noted to have disease progression in his lymph nodes in May 2020. Repeat biopsy showed metastatic urothelial carcinoma which did not have aFGFR mutation. He has been started on third line Padcev  Interval history-he has some good days and bad days when he has more fatigue than usual.  Neuropathy has remained overall stable.  Appetite and weight remain stable.  ECOG PS- 1 Pain scale- 0   Review of systems- Review of Systems  Constitutional: Positive for malaise/fatigue. Negative for chills, fever and weight loss.  HENT: Negative for congestion, ear discharge and nosebleeds.   Eyes: Negative for blurred vision.  Respiratory: Negative for cough, hemoptysis, sputum production, shortness of breath and wheezing.   Cardiovascular: Negative for chest pain, palpitations, orthopnea and claudication.  Gastrointestinal: Negative for abdominal pain, blood in stool, constipation, diarrhea, heartburn, melena, nausea and vomiting.  Genitourinary: Negative for dysuria, flank pain, frequency, hematuria and urgency.  Musculoskeletal: Negative for back pain, joint pain and myalgias.  Skin: Negative for rash.  Neurological: Positive for sensory change (Peripheral neuropathy). Negative for dizziness, tingling, focal weakness, seizures, weakness and headaches.  Endo/Heme/Allergies: Does not bruise/bleed easily.  Psychiatric/Behavioral: Negative for depression and suicidal ideas. The patient does not have insomnia.      Allergies  Allergen Reactions  . Sulfa Antibiotics Other (See Comments)    Joint pain  . Ace Inhibitors Cough  . Invokana [Canagliflozin] Other (See Comments)    Leg pain  . Penicillins Rash    Did it involve swelling of the face/tongue/throat, SOB, or low BP? No Did it involve sudden or severe rash/hives, skin  peeling, or any reaction on the inside of your mouth or nose? Yes Did you need to seek medical attention at a hospital or doctor's office? Yes When did it last happen?childhood allergy If all above answers are "NO", may proceed with cephalosporin use.      Past Medical History:  Diagnosis Date  . Acid reflux   . Anxiety   . Depression   . Diabetes mellitus without complication ([place])   . Hyperlipidemia   . Hypertension   . MRSA (methicillin resistant Staphylococcus aureus) infection 1992   history  . Myocardial infarction ([place]) 1995  . Sleep apnea    CPAP  . Urothelial cancer ([place]) 11/2017   Left Urothelial mass, chemo tx's     Past Surgical History:  Procedure Laterality Date  . CATARACT EXTRACTION Left   . COLONOSCOPY  2010   [facility]  . COLONOSCOPY WITH PROPOFOL N/A 10/04/2016   Procedure: COLONOSCOPY WITH PROPOFOL;  Surgeon: [name], MD;  Location: [facility] ENDOSCOPY;  Service: Endoscopy;  Laterality: N/A;  . [place], 2000, 2001, 2014  . CYSTOSCOPY W/ RETROGRADES Bilateral 12/10/2017   Procedure: CYSTOSCOPY WITH RETROGRADE PYELOGRAM;  Surgeon: [name], MD;  Location: [facility] ORS;  Service: Urology;  Laterality: Bilateral;  . CYSTOSCOPY W/ RETROGRADES Left 12/09/2018   Procedure: CYSTOSCOPY WITH RETROGRADE PYELOGRAM;  Surgeon: [name], MD;  Location: [facility] ORS;  Service: Urology;  Laterality: Left;  . CYSTOSCOPY WITH BIOPSY Left 12/09/2018   Procedure: CYSTOSCOPY WITH URETERAL/RENAL PELVIC BIOPSY;  Surgeon: [name], MD;  Location: [facility] ORS;  Service: Urology;  Laterality: Left;  . CYSTOSCOPY WITH STENT PLACEMENT Left 12/10/2017   Procedure: CYSTOSCOPY WITH STENT PLACEMENT;  Surgeon: [name], MD;  Location: [facility] ORS;  Service: Urology;  Laterality: Left;  . CYSTOSCOPY WITH STENT PLACEMENT Left 12/09/2018   Procedure: CYSTOSCOPY WITH STENT PLACEMENT;  Surgeon: [name], MD;  Location: [facility] ORS;   Service: Urology;  Laterality: Left;  . CYSTOSCOPY WITH URETEROSCOPY Left 12/09/2018   Procedure: CYSTOSCOPY WITH URETEROSCOPY;  Surgeon: [name], MD;  Location: [facility] ORS;  Service: Urology;  Laterality: Left;  . EYE SURGERY Bilateral    cataract  . INGUINAL HERNIA REPAIR Right 08/09/2015   Procedure: HERNIA REPAIR INGUINAL ADULT;  Surgeon: [name], MD;  Location: [facility] ORS;  Service: General;  Laterality: Right;  . NASAL SINUS SURGERY    . nuclear stress test    . PORTA CATH INSERTION N/A 12/19/2017   Procedure: PORTA CATH INSERTION;  Surgeon: [name], MD;  Location: [facility];  Service: Cardiovascular;  Laterality: N/A;  . URETERAL BIOPSY Left 12/10/2017   Procedure: URETERAL & renal PELVIS BIOPSY;  Surgeon: [name], MD;  Location: [facility] ORS;  Service: Urology;  Laterality: Left;  . URETEROSCOPY Left 12/10/2017   Procedure: URETEROSCOPY;  Surgeon: [name], MD;  Location: [facility] ORS;  Service: Urology;  Laterality: Left;    Social History   Socioeconomic History  . Marital status: Married    Spouse name: [name]  . Number of children: 1  . Years of education: Not on file  . Highest education level: Associate degree: occupational, Hotel manager, or vocational program  Occupational History  . Occupation: retired  Tobacco Use  . Smoking status: Former Smoker    Types: Cigars    Quit date: 10/09/1978    Years since quitting: 41.0  . Smokeless tobacco: Former Systems developer    Types: Chew    Quit date:  12/04/1988  . Tobacco comment: on occasion  Substance and Sexual Activity  . Alcohol use: Not Currently  . Drug use: No  . Sexual activity: Not Currently  Other Topics Concern  . Not on file  Social History Narrative  . Not on file   Social Determinants of Health   Financial Resource Strain:   . Difficulty of Paying Living Expenses:   Food Insecurity:   . Worried About Charity fundraiser in the Last Year:   . Arboriculturist in the Last Year:     Transportation Needs:   . Film/video editor (Medical):   Marland Kitchen Lack of Transportation (Non-Medical):   Physical Activity: Inactive  . Days of Exercise per Week: 0 days  . Minutes of Exercise per Session: 0 min  Stress: No Stress Concern Present  . Feeling of Stress : Not at all  Social Connections: Unknown  . Frequency of Communication with Friends and Family: Patient refused  . Frequency of Social Gatherings with Friends and Family: Patient refused  . Attends Religious Services: Patient refused  . Active Member of Clubs or Organizations: Patient refused  . Attends Archivist Meetings: Patient refused  . Marital Status: Patient refused  Intimate Partner Violence: Unknown  . Fear of Current or Ex-Partner: Patient refused  . Emotionally Abused: Patient refused  . Physically Abused: Patient refused  . Sexually Abused: Patient refused    Family History  Problem Relation Age of Onset  . Heart disease Mother   . Cancer Father        Lung and colon cancer  . Heart disease Father   . Emphysema Maternal Grandfather   . Tuberculosis Maternal Grandmother      Current Outpatient Medications:  .  aspirin EC 81 MG tablet, Take 81 mg by mouth every evening. , Disp: , Rfl:  .  atorvastatin (LIPITOR) 40 MG tablet, TAKE 1 TABLET BY MOUTH AT BEDTIME, Disp: 90 tablet, Rfl: 4 .  Cholecalciferol (VITAMIN D3) 75 MCG (3000 UT) TABS, Take by mouth., Disp: , Rfl:  .  clobetasol cream (TEMOVATE) [id number] %, Apply 1 application topically as needed (for skin rash)., Disp: 30 g, Rfl: 1 .  fluticasone (FLONASE) 50 MCG/ACT nasal spray, Use 2 spray(s) in each nostril once daily, Disp: 48 g, Rfl: 3 .  glucose blood (CONTOUR NEXT TEST) test strip, Check blood sugars 3 times daily., Disp: 300 each, Rfl: 11 .  HYDROcodone-acetaminophen (NORCO/VICODIN) 5-325 MG tablet, Take 1 tablet by mouth 1 day or 1 dose., Disp: , Rfl:  .  hydrOXYzine (ATARAX/VISTARIL) 25 MG tablet, Take 1 tablet (25 mg total) by  mouth every 8 (eight) hours as needed for itching., Disp: 30 tablet, Rfl: 3 .  Ivermectin (SOOLANTRA) 1 % CREA, Apply 1 application topically daily as needed (rosacea). To face, Disp: , Rfl:  .  JARDIANCE 10 MG TABS tablet, Take 1 tablet by mouth once daily, Disp: 30 tablet, Rfl: 11 .  lidocaine-prilocaine (EMLA) cream, Apply 1 application topically as needed (port access)., Disp: 1 g, Rfl: 3 .  loratadine (CLARITIN) 10 MG tablet, Take 10 mg by mouth daily as needed for allergies. [employer] brand allergy relief, Disp: , Rfl:  .  losartan (COZAAR) 50 MG tablet, Take 1 tablet (50 mg total) by mouth daily., Disp: 90 tablet, Rfl: 3 .  magic mouthwash w/lidocaine SOLN, Take 5 mLs by mouth 4 (four) times daily as needed for mouth pain., Disp: 480 mL, Rfl: 3 .  magnesium  hydroxide (MILK OF MAGNESIA) 400 MG/5ML suspension, Take 15 mLs by mouth daily as needed for mild constipation., Disp: , Rfl:  .  metFORMIN (GLUCOPHAGE) 1000 MG tablet, TAKE 1 TABLET BY MOUTH TWICE DAILY WITH MEALS, Disp: 180 tablet, Rfl: 1 .  nystatin cream (MYCOSTATIN), Apply topically 2 (two) times daily., Disp: 15 g, Rfl: 1 .  OLANZapine (ZYPREXA) 10 MG tablet, Take 1 tablet (10 mg total) by mouth at bedtime., Disp: 30 tablet, Rfl: 1 .  OVER THE COUNTER MEDICATION, Apply 1 application topically daily as needed (pain). Outback topical pain relief, Disp: , Rfl:  .  oxybutynin (DITROPAN) 5 MG tablet, Take 1 tablet by mouth 1 day or 1 dose., Disp: , Rfl:  .  sertraline (ZOLOFT) 100 MG tablet, Take 1 tablet (100 mg total) by mouth daily., Disp: 90 tablet, Rfl: 3 .  vitamin B-12 (CYANOCOBALAMIN) 1000 MCG tablet, Take by mouth daily. Unsure dose, Disp: , Rfl:  .  vitamin C (ASCORBIC ACID) 500 MG tablet, Take 1,000-1,500 mg by mouth daily as needed (immune support)., Disp: , Rfl:  .  VITAMIN D PO, Take 1 tablet by mouth 1 day or 1 dose., Disp: , Rfl:  .  gabapentin (NEURONTIN) 300 MG capsule, TAKE 1 CAPSULE BY MOUTH THREE TIMES DAILY, Disp: 270  capsule, Rfl: 0 No current facility-administered medications for this visit.  Facility-Administered Medications Ordered in Other Visits:  .  Influenza vac split quadrivalent PF (FLUZONE HIGH-DOSE) injection 0.5 mL, 0.5 mL, Intramuscular, Once, Karen Kitchens, NP  Physical exam:  Vitals:   10/07/19 1003  BP: 94/74  Pulse: 78  Temp: (!) 94.2 F (34.6 C)  TempSrc: Tympanic  SpO2: 97%  Weight: 157 lb (71.2 kg)  Height: 5\' 8"  (1.727 m)   Physical Exam Constitutional:      General: He is not in acute distress. HENT:     Head: Normocephalic and atraumatic.  Eyes:     Pupils: Pupils are equal, round, and reactive to light.  Cardiovascular:     Rate and Rhythm: Normal rate and regular rhythm.     Heart sounds: Normal heart sounds.  Pulmonary:     Effort: Pulmonary effort is normal.     Breath sounds: Normal breath sounds.  Abdominal:     General: Bowel sounds are normal.     Palpations: Abdomen is soft.  Musculoskeletal:     Cervical back: Normal range of motion.  Skin:    General: Skin is warm and dry.  Neurological:     Mental Status: He is alert and oriented to person, place, and time.      CMP Latest Ref Rng & Units 10/07/2019  Glucose 70 - 99 mg/dL 166(H)  BUN 8 - 23 mg/dL 20  Creatinine 0.61 - 1.24 mg/dL 0.81  Sodium 135 - 145 mmol/L 137  Potassium 3.5 - 5.1 mmol/L 4.0  Chloride 98 - 111 mmol/L 103  CO2 22 - 32 mmol/L 26  Calcium 8.9 - 10.3 mg/dL 9.7  Total Protein 6.5 - 8.1 g/dL 7.4  Total Bilirubin 0.3 - 1.2 mg/dL 1.0  Alkaline Phos 38 - 126 U/L 164(H)  AST 15 - 41 U/L 70(H)  ALT 0 - 44 U/L 138(H)   CBC Latest Ref Rng & Units 10/07/2019  WBC 4.0 - 10.5 K/uL 6.7  Hemoglobin 13.0 - 17.0 g/dL 12.9(L)  Hematocrit 39.0 - 52.0 % 39.1  Platelets 150 - 400 K/uL 204      Assessment and plan- Patient is a 74  y.o. 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 He is here for on treatment assessment prior to next  cycle of Padcev  Patient is currently on a 1 week on 3 weeks off regimen to maintain his quality of life and to prevent his neuropathy from getting worse.  He would like to continue to remain on this regimen.  Counts are okay to proceed with Padcev today and I will see him back in 4 weeks for the next treatment.  Plan to get CT chest abdomen and pelvis with contrast 3 weeks from now.  Patient noted to have an elevated AST and ALT today.  Total bilirubin and albumin are normal.  I will plan to repeat that in 2 weeks time.  He reports eating restaurant food couple of days ago which did not agree with him when he felt nauseous after that.  Visit Diagnosis 1. Urothelial carcinoma of kidney, left ([place])   2. Encounter for antineoplastic chemotherapy   3. Chemotherapy-induced peripheral neuropathy ([place])   4. Abnormal LFTs      Dr. Randa Evens, MD, MPH [facility] [id number] 10/09/2019 5:25 PM

## 2019-10-10 ENCOUNTER — Telehealth: Payer: Self-pay

## 2019-10-10 NOTE — Telephone Encounter (Signed)
Telephone call to schedule palliative care visit  Patient in agreement with palliative care team making home visit 10/14/19 at 2:00 PM.

## 2019-10-13 ENCOUNTER — Ambulatory Visit: Payer: Medicare Other | Admitting: Physical Therapy

## 2019-10-13 ENCOUNTER — Ambulatory Visit: Payer: Medicare Other | Attending: Family Medicine

## 2019-10-13 ENCOUNTER — Other Ambulatory Visit: Payer: Self-pay

## 2019-10-13 DIAGNOSIS — M6281 Muscle weakness (generalized): Secondary | ICD-10-CM | POA: Insufficient documentation

## 2019-10-13 DIAGNOSIS — R2681 Unsteadiness on feet: Secondary | ICD-10-CM | POA: Diagnosis not present

## 2019-10-13 NOTE — Therapy (Signed)
[place] MAIN [facility] SERVICES [address] [place], [place], [address] Phone: [phone]   Fax:  [phone]  Physical Therapy Treatment  Patient Details  Name: Patrick Mcgee MRN: [id number] Date of Birth: 12-Oct-1945 Referring Provider (PT): Dr. Rosanna Randy   Encounter Date: 10/13/2019  PT End of Session - 10/13/19 1654    Visit Number  31    Number of Visits  49    Date for PT Re-Evaluation  11/19/19    PT Start Time  0174    PT Stop Time  1730    PT Time Calculation (min)  45 min    Equipment Utilized During Treatment  Gait belt    Activity Tolerance  Patient tolerated treatment well;No increased pain    Behavior During Therapy  WFL for tasks assessed/performed       Past Medical History:  Diagnosis Date  . Acid reflux   . Anxiety   . Depression   . Diabetes mellitus without complication ([place])   . Hyperlipidemia   . Hypertension   . MRSA (methicillin resistant Staphylococcus aureus) infection 1992   history  . Myocardial infarction ([place]) 1995  . Sleep apnea    CPAP  . Urothelial cancer ([place]) 11/2017   Left Urothelial mass, chemo tx's    Past Surgical History:  Procedure Laterality Date  . CATARACT EXTRACTION Left   . COLONOSCOPY  2010   [facility]  . COLONOSCOPY WITH PROPOFOL N/A 10/04/2016   Procedure: COLONOSCOPY WITH PROPOFOL;  Surgeon: [name], MD;  Location: [facility] ENDOSCOPY;  Service: Endoscopy;  Laterality: N/A;  . [place], 2000, 2001, 2014  . CYSTOSCOPY W/ RETROGRADES Bilateral 12/10/2017   Procedure: CYSTOSCOPY WITH RETROGRADE PYELOGRAM;  Surgeon: [name], MD;  Location: [facility] ORS;  Service: Urology;  Laterality: Bilateral;  . CYSTOSCOPY W/ RETROGRADES Left 12/09/2018   Procedure: CYSTOSCOPY WITH RETROGRADE PYELOGRAM;  Surgeon: [name], MD;  Location: [facility] ORS;  Service: Urology;  Laterality: Left;  . CYSTOSCOPY WITH BIOPSY Left 12/09/2018   Procedure:  CYSTOSCOPY WITH URETERAL/RENAL PELVIC BIOPSY;  Surgeon: [name], MD;  Location: [facility] ORS;  Service: Urology;  Laterality: Left;  . CYSTOSCOPY WITH STENT PLACEMENT Left 12/10/2017   Procedure: CYSTOSCOPY WITH STENT PLACEMENT;  Surgeon: [name], MD;  Location: [facility] ORS;  Service: Urology;  Laterality: Left;  . CYSTOSCOPY WITH STENT PLACEMENT Left 12/09/2018   Procedure: CYSTOSCOPY WITH STENT PLACEMENT;  Surgeon: [name], MD;  Location: [facility] ORS;  Service: Urology;  Laterality: Left;  . CYSTOSCOPY WITH URETEROSCOPY Left 12/09/2018   Procedure: CYSTOSCOPY WITH URETEROSCOPY;  Surgeon: [name], MD;  Location: [facility] ORS;  Service: Urology;  Laterality: Left;  . EYE SURGERY Bilateral    cataract  . INGUINAL HERNIA REPAIR Right 08/09/2015   Procedure: HERNIA REPAIR INGUINAL ADULT;  Surgeon: [name], MD;  Location: [facility] ORS;  Service: General;  Laterality: Right;  . NASAL SINUS SURGERY    . nuclear stress test    . PORTA CATH INSERTION N/A 12/19/2017   Procedure: PORTA CATH INSERTION;  Surgeon: [name], MD;  Location: [facility] CV LAB;  Service: Cardiovascular;  Laterality: N/A;  . URETERAL BIOPSY Left 12/10/2017   Procedure: URETERAL & renal PELVIS BIOPSY;  Surgeon: [name], MD;  Location: [facility] ORS;  Service: Urology;  Laterality: Left;  . URETEROSCOPY Left 12/10/2017   Procedure: URETEROSCOPY;  Surgeon: [name], MD;  Location: [facility] ORS;  Service: Urology;  Laterality: Left;    There were no vitals filed for this visit.  Subjective Assessment - 10/13/19 1653    Subjective  Pt reports that he is doing well today. He denies any pain upon arrival. He did have a fall yesterday in the house when helping his granddaughter with Easter eggs. He reports "I busted my lip" but otherwise denies injury. No specific questions upon arrival today.    Pertinent History  Part of history borrowed from oncology note and verified with patient: Patient is a 74 year old  male who was referred for foot drop. He started with Dr. [name] so far for his metastatic urothelial carcinoma.  This was originally diagnosed in May 2019. He was started on chemotherapy in June 2019 and was continued on 05/27/2018 for 8 cycles.  He tolerated chemotherapy well except for chemo-induced anemia for which she has been getting Procrit every 2 weeks.  He did have response to his disease based on scans in August 2019.  However repeat scan on 05/31/2018 showed increase in the size of the primary tumor from 1.2 to 1.9 cm and increase in left para-aortic adenopathy from 0.9 to 1.3 cm.  Second line immunotherapy was recommended.  His initial biopsy specimen did not have enough sample to undergo FGFR mutation testing. He also has mild interstitial lung disease for which he sees pulmonary but he is not on home oxygen.  He has B12 deficiency for which he is on oral B12.  Also has diabetes and coronary artery disease. Patient noted to have disease progression in his lymph nodes in May 2020.  Repeat biopsy showed metastatic urothelial carcinoma which did not have a FGFR mutation.  He has been started on third line Padcev. There is still times when he feels fatigued but he is having more good days. He reports poor appetite. He was working with physical therapy on his foot drop, RLE weakness, and unsteadiness. He states that he gets considerable numbness for the 7-8 days following his chemotherapy treatment. He states that he is a diabetic and did have some numbness prior to starting chemotherapy. He states that he has a history of a low back disc bulge with RLE radicular symptoms. He used to get spinal injections but reports he hasn't had any injections in the last 10 years.    Limitations  Walking    Currently in Pain?  No/denies          TREATMENT   Ther-ex NuStep L2-4x 5 minutes total for warm-up during history (59mnutes unbilled); Precor BLE leg press 105# 3 x 20, Precor BLEheel raises 55#  x 20; Matrix resisted gait 22.5# forward, backward, R lateral, and L lateral x 3 each direction; Wedding marches with green tband around ankles 20' x 2; STS from chairwith no UE support 2kg med ball overhead press 2 x 10; Forward and lateral 6" step-ups without UE support x 15 each; Standing heel raises with BUE support x 20; BOSU (round side up) lunges in // bars without UE support x 15 bilateral;   Pt educated throughout session about proper posture and technique with exercises. Improved exercise technique, movement at target joints, use of target muscles after min to mod verbal, visual, tactile cues.   Ptdemonstrates excellent motivation during session today. He is able to increase the resistance again on the leg press today.He states that he has an upcoming appointment with [employer] to get fitted for an AFO. He continues to demonstrate R foot drop which is a concern for increased  fall risk. Pt is able to complete all exercises as instructed today but with some fatigue.Pt encouraged to continue HEP and follow-up as scheduled. Hewould benefit from further skilled PT intervention to maximize safety, mobility, and independence.                        PT Short Term Goals - 08/27/19 1307      PT SHORT TERM GOAL #1   Title  Pt will be independent with HEP in order to improve strength and balance in order to decrease fall risk and improve function at home    Time  6    Period  Weeks    Status  On-going    Target Date  07/14/19        PT Long Term Goals - 10/08/19 1710      PT LONG TERM GOAL #1   Title  Pt will improve BERG by at least 3 points in order to demonstrate clinically significant improvement in balance.    Time  12    Status  Achieved      PT LONG TERM GOAL #2   Title  Pt will improve ABC by at least 13% in order to demonstrate clinically significant improvement in balance confidence.    Baseline  06/02/19: 64.4%; 07/07/19: 65%; 08/27/19: 65%,,  10/08/19=70%    Time  12    Period  Weeks    Status  On-going    Target Date  11/19/19      PT LONG TERM GOAL #3   Title  Pt will increase 30s Sit to Stand Test to greater than or equal to 14 repetitions in order to demonstrate clinically significant improvement in LE endurance and decrease in fall risk.    Baseline  06/02/19: 10 repetitions; 07/07/19: 13 repetitions; 08/27/19: 14.5 repetitions    Status  Achieved      PT LONG TERM GOAL #4   Title  Pt will increase 6MWT by at least 148m(3232f in order to demonstrate clinically significant improvement in cardiopulmonary endurance and community ambulation    Baseline  06/10/20: 137515fno AD, no LOB, Rt foot slap without flat foot strike; 07/07/19: test terminated 2/2 fall; 07/09/19: 1565' with R AFO, no assistive device, mask donned; 08/27/19: 1615' with R AFO, no assistive, mask donned, 3/31/211600 ft    Time  12    Period  Weeks    Status  Partially Met    Target Date  11/19/19      PT LONG TERM GOAL #5   Title  Pt will improve FGA by at least 4 points in order to demonstrate clinically significant improvement in balance and decreased risk for falls.    Baseline  10/08/19=26/30    Period  Weeks    Status  Partially Met    Target Date  11/19/19            Plan - 10/13/19 1654    Clinical Impression Statement  Pt demonstrates excellent motivation during session today.  He is able to increase the resistance again on the leg press today. He states that he has an upcoming appointment with [employer] to get fitted for an AFO. He continues to demonstrate R foot drop which is a concern for increased fall risk. Pt is able to complete all exercises as instructed today but with some fatigue. Pt encouraged to continue HEP and follow-up as scheduled. He would benefit from further skilled PT intervention to maximize safety,  mobility, and independence.    Personal Factors and Comorbidities  Age;Comorbidity 3+;Time since onset of  injury/illness/exacerbation    Comorbidities  Urothelial cancer, DM, previous lumbar disc bulge, interstitial pulmonary disease    Examination-Activity Limitations  Locomotion Level;Stairs;Transfers    Examination-Participation Restrictions  Community Activity;Laundry;Shop    Stability/Clinical Decision Making  Evolving/Moderate complexity    Rehab Potential  Fair    PT Frequency  2x / week    PT Duration  12 weeks    PT Treatment/Interventions  ADLs/Self Care Home Management;Aquatic Therapy;Biofeedback;Canalith Repostioning;Cryotherapy;Iontophoresis '4mg'$ /ml Dexamethasone;Moist Heat;Traction;Ultrasound;DME Instruction;Gait training;Stair training;Functional mobility training;Therapeutic activities;Therapeutic exercise;Balance training;Neuromuscular re-education;Patient/family education;Orthotic Fit/Training;Manual techniques;Dry needling;Energy conservation;Vestibular;Joint Manipulations    PT Next Visit Plan  Continue with strengthening and balance    PT Home Exercise Plan  Access Code: [email or web address]    Consulted and Agree with Plan of Care  Patient       Patient will benefit from skilled therapeutic intervention in order to improve the following deficits and impairments:  Abnormal gait, Decreased activity tolerance, Decreased balance, Decreased strength  Visit Diagnosis: Unsteadiness on feet  Muscle weakness (generalized)     Problem List Patient Active Problem List   Diagnosis Date Noted  . Chest pain 03/04/2019  . Anemia due to antineoplastic chemotherapy 03/03/2018  . Cough 03/03/2018  . Chemotherapy induced neutropenia ([place]) 01/22/2018  . B12 deficiency 01/01/2018  . Hypomagnesemia 12/21/2017  . Encounter for antineoplastic chemotherapy 12/21/2017  . Malignant neoplasm of kidney ([place])   . Urothelial cancer ([place]) 12/18/2017  . Malnutrition of moderate degree 12/18/2017  . Therapeutic opioid induced constipation   . Palliative care encounter   . Dehydration 12/17/2017  . Goals  of care, counseling/discussion 12/13/2017  . Urothelial carcinoma of kidney, left ([place]) 12/07/2017  . Right inguinal hernia 07/17/2015  . Family history of colon cancer 07/17/2015  . Allergic rhinitis 11/12/2014  . Airway hyperreactivity 11/12/2014  . Atherosclerosis of coronary artery 11/12/2014  . Cheilitis 11/12/2014  . Narrowing of intervertebral disc space 11/12/2014  . Deflected nasal septum 11/12/2014  . HLD (hyperlipidemia) 11/12/2014  . BP (high blood pressure) 11/12/2014  . Adult hypothyroidism 11/12/2014  . Sleep apnea 11/12/2014  . Diabetes mellitus, type 2 ([place]) 11/12/2014  . Degenerative disc disease, lumbar 09/24/2012  . Furunculosis 04/23/2012   Phillips Grout PT, DPT, GCS  Alixis Harmon 10/14/2019, 12:57 PM  [place] MAIN [facility] SERVICES [address] [place], [place], [address] Phone: [phone]   Fax:  [phone]  Name: Patrick Mcgee MRN: [id number] Date of Birth: September 22, 1945

## 2019-10-14 ENCOUNTER — Other Ambulatory Visit: Payer: Medicare Other

## 2019-10-14 ENCOUNTER — Other Ambulatory Visit: Payer: Self-pay

## 2019-10-14 DIAGNOSIS — Z515 Encounter for palliative care: Secondary | ICD-10-CM

## 2019-10-14 NOTE — Progress Notes (Signed)
COMMUNITY PALLIATIVE CARE SW NOTE  PATIENT NAME: Patrick Mcgee DOB: 07-28-1945 MRN: [id number]  PRIMARY CARE PROVIDER: Jerrol Banana., MD  RESPONSIBLE PARTY:  Acct ID - Guarantor Home Phone Work Phone Relationship Acct Type  [id number] RAHEEN, [id number]  Self P/F     [address], [place], [place] [address]     PLAN OF CARE and INTERVENTIONS:             1. GOALS OF CARE/ ADVANCE CARE PLANNING:  Patientis DNR, form is in the home.HCPOA is Patrick Mcgee (patient's daughter).Patient'sgoal is to maintain his quality of life. 2. SOCIAL/EMOTIONAL/SPIRITUAL ASSESSMENT/ INTERVENTIONS:  SW and RN completed home visit with patient and Patrick Mcgee (patient's wife). Patient denies pain today. Patient does experience neuropathy and itching. Patient continues to get acupuncture to assist with circulation. Patient had a fall on 4/4, hunting Easter eggs with his granddaughter. Patient said appetite is fair, does better if Patrick Mcgee prepares food and encourages him to eat. Patient is sleeping weil, sleeping more. Patient said he has days where he feels weaker. Patient enjoys time with family and going outside. SW provided supportive counseling, reviewed care plan and goals, and used active and reflective listening. 3. PATIENT/CAREGIVER EDUCATION/ COPING:  Patient is alert, engaged. Patient is forgetful. Patient openly expresses his feelings. Patient copes with his [place] faith. Patient has strong family support. 4. PERSONAL EMERGENCY PLAN:  Patient/family will call 9-1-1 for emergencies.  5. COMMUNITY RESOURCES COORDINATION/ HEALTH CARE NAVIGATION:  Patient goes to bi-weekly PT. Patient is scheduled for blood work on 4/13. Patient is scheduled for CT scan on 4/16. 6. FINANCIAL/LEGAL CONCERNS/INTERVENTIONS:  None.     SOCIAL HX:  Social History   Tobacco Use  . Smoking status: Former Smoker    Types: Cigars    Quit date: 10/09/1978    Years since quitting: 41.0  . Smokeless tobacco: Former Systems developer     Types: Chew    Quit date: 12/04/1988  . Tobacco comment: on occasion  Substance Use Topics  . Alcohol use: Not Currently    CODE STATUS: DNR ADVANCED DIRECTIVES: Y MOST FORM COMPLETE:  No. HOSPICE EDUCATION PROVIDED: none.   PPS: Patient is independent of ADLs.Patient is ambulating without assistance but is unsteady. Patient continues outpatient PT.  I spent16minutes with patient/family, from2:30-3:00pproviding education, support and consultation.  Patrick Lombard, LCSW

## 2019-10-14 NOTE — Progress Notes (Signed)
PATIENT NAME: Patrick Mcgee DOB: 1946/01/28 MRN: [id number]  PRIMARY CARE PROVIDER: Jerrol Banana., MD  RESPONSIBLE PARTY:  Acct ID - Guarantor Home Phone Work Phone Relationship Acct Type  [id number] Patrick Mcgee, Patrick Mcgee* [phone]  Self P/F     [address], [place], [place] [address]    PLAN OF CARE and INTERVENTIONS:               1.  GOALS OF CARE/ ADVANCE CARE PLANNING:  Remain in home with wife and continue receiving chemo.               2.  PATIENT/CAREGIVER EDUCATION:  Education on fall precautions, education on s/s of infection, reviewed meds, support                 3.  DISEASE STATUS: SW and RN made scheduled palliative care visit. Palliative care team met with patient and patients wife Patrick Mcgee. Patient sleeping in recliner when palliative care team arrived. Patient gets up and walks into living room to talk with palliative care team. Patient denies having any pain at the present time. Patient continues to receive chemotherapy monthly. Patient reports he suffered a fall on Sunday while outside with granddaughter. Patient busted his lip but had no other injuries. Patient's current weight is 155 lbs. Patient continues to have numbness in his feet and hands. Patient continues to take gabapentin 300 mg twice a day for numbness. Patient remains independent with ambulation. Patient continues to receive outpatient physical therapy and also has been receiving acupuncture. Patients is pale in color today. Patient to have scan done on 10-24-19. Palliative team discussed patient being followed by nurse practitioner however decision was made for [facility] team to continue to follow patient until after his scan. Wife reports patient will eat if she gives him food however patient does not ask for or seek out food. Patient complains of having nausea and vomiting a week ago and feels this was related to take out food. Wife reports patients liver enzymes were elevated and oncologist felt this may have been due to  stomach virus or bad food.Patient to have labs drawn again 10-24-19.  Patient has no edema in his lower extremities. Patient has no open areas of skin breakdown. Patient has port-a-cath in place. Patients breath sounds are clear and patient denies having any shortness of breath. Patient has an occasional cough that he feels his allergy related. Patient reports he continues to sleep a lot and wife reports patient sleeps excessively. Nurse reviewed patient's medications with patient and wife. Patient and wife appreciative of visit and both were encouraged to contact palliative care with questions or concerns.     HISTORY OF PRESENT ILLNESS:  Patient is a 74 year old male who resides in home with his wife.  Patient continues to receive chemo monthly.  Patient is followed by palliative care and is seen monthly and PRN.     CODE STATUS: DNR  ADVANCED DIRECTIVES: Yes MOST FORM: No PPS: 60%   PHYSICAL EXAM:   VITALS: Today's Vitals   10/14/19 1446 10/14/19 1506  BP:  124/80  Pulse:  65  Resp:  16  Temp:  (!) 97.4 F (36.3 C)  TempSrc:  Temporal  SpO2:  98%  Weight:  166 lb (75.3 kg)  PainSc: 0-No pain 0-No pain    LUNGS: clear to auscultation  CARDIAC: Cor RRR  EXTREMITIES: none edema SKIN: Skin color, texture, turgor normal. No rashes or lesions  NEURO: positive for  gait problems and weakness       Nilda Simmer, RN

## 2019-10-15 ENCOUNTER — Ambulatory Visit: Payer: Medicare Other

## 2019-10-16 ENCOUNTER — Other Ambulatory Visit: Payer: Self-pay

## 2019-10-16 ENCOUNTER — Ambulatory Visit: Payer: Medicare Other

## 2019-10-16 DIAGNOSIS — M6281 Muscle weakness (generalized): Secondary | ICD-10-CM | POA: Diagnosis not present

## 2019-10-16 DIAGNOSIS — R2681 Unsteadiness on feet: Secondary | ICD-10-CM

## 2019-10-16 NOTE — Therapy (Signed)
[place] MAIN [facility] SERVICES [address] [place], [place], [address] Phone: [phone]   Fax:  [phone]  Physical Therapy Treatment  Patient Details  Name: Patrick Mcgee MRN: [id number] Date of Birth: Sep 03, 1945 Referring Provider (PT): Dr. Rosanna Randy   Encounter Date: 10/16/2019  PT End of Session - 10/16/19 1608    Visit Number  32    Number of Visits  49    Date for PT Re-Evaluation  11/19/19    PT Start Time  1600    PT Stop Time  1645    PT Time Calculation (min)  45 min    Equipment Utilized During Treatment  Gait belt    Activity Tolerance  Patient tolerated treatment well;No increased pain    Behavior During Therapy  WFL for tasks assessed/performed       Past Medical History:  Diagnosis Date  . Acid reflux   . Anxiety   . Depression   . Diabetes mellitus without complication ([place])   . Hyperlipidemia   . Hypertension   . MRSA (methicillin resistant Staphylococcus aureus) infection 1992   history  . Myocardial infarction ([place]) 1995  . Sleep apnea    CPAP  . Urothelial cancer ([place]) 11/2017   Left Urothelial mass, chemo tx's    Past Surgical History:  Procedure Laterality Date  . CATARACT EXTRACTION Left   . COLONOSCOPY  2010   [facility]  . COLONOSCOPY WITH PROPOFOL N/A 10/04/2016   Procedure: COLONOSCOPY WITH PROPOFOL;  Surgeon: [name], MD;  Location[facility] ENDOSCOPY;  Service: Endoscopy;  Laterality: N/A;  . [place], 2000, 2001, 2014  . CYSTOSCOPY W/ RETROGRADES Bilateral 12/10/2017   Procedure: CYSTOSCOPY WITH RETROGRADE PYELOGRAM;  Surgeon: [name], MD;  Location: [facility] ORS;  Service: Urology;  Laterality: Bilateral;  . CYSTOSCOPY W/ RETROGRADES Left 12/09/2018   Procedure: CYSTOSCOPY WITH RETROGRADE PYELOGRAM;  Surgeon: [name], MD;  Location: [facility] ORS;  Service: Urology;  Laterality: Left;  . CYSTOSCOPY WITH BIOPSY Left 12/09/2018   Procedure:  CYSTOSCOPY WITH URETERAL/RENAL PELVIC BIOPSY;  Surgeon: [name], MD;  Location: [facility] ORS;  Service: Urology;  Laterality: Left;  . CYSTOSCOPY WITH STENT PLACEMENT Left 12/10/2017   Procedure: CYSTOSCOPY WITH STENT PLACEMENT;  Surgeon: [name], MD;  Location: [facility] ORS;  Service: Urology;  Laterality: Left;  . CYSTOSCOPY WITH STENT PLACEMENT Left 12/09/2018   Procedure: CYSTOSCOPY WITH STENT PLACEMENT;  Surgeon: [name], MD;  Location: [facility] ORS;  Service: Urology;  Laterality: Left;  . CYSTOSCOPY WITH URETEROSCOPY Left 12/09/2018   Procedure: CYSTOSCOPY WITH URETEROSCOPY;  Surgeon: [name], MD;  Location: [facility] ORS;  Service: Urology;  Laterality: Left;  . EYE SURGERY Bilateral    cataract  . INGUINAL HERNIA REPAIR Right 08/09/2015   Procedure: HERNIA REPAIR INGUINAL ADULT;  Surgeon: [name], MD;  Location: [facility] ORS;  Service: General;  Laterality: Right;  . NASAL SINUS SURGERY    . nuclear stress test    . PORTA CATH INSERTION N/A 12/19/2017   Procedure: PORTA CATH INSERTION;  Surgeon: [name], MD;  Location: [place] CV LAB;  Service: Cardiovascular;  Laterality: N/A;  . URETERAL BIOPSY Left 12/10/2017   Procedure: URETERAL & renal PELVIS BIOPSY;  Surgeon: [name], MD;  Location: [facility] ORS;  Service: Urology;  Laterality: Left;  . URETEROSCOPY Left 12/10/2017   Procedure: URETEROSCOPY;  Surgeon: [name], MD;  Location: [facility] ORS;  Service: Urology;  Laterality: Left;    There were no vitals filed for this visit.  Subjective Assessment - 10/16/19 1606    Subjective  Pt reports that he is doing well today. He denies any pain upon arrival. No falls since last therapy session. No specific questions upon arrival today.    Pertinent History  Part of history borrowed from oncology note and verified with patient: Patient is a 74 year old male who was referred for foot drop. He started with Dr. [name] so far for his metastatic urothelial  carcinoma.  This was originally diagnosed in May 2019. He was started on chemotherapy in June 2019 and was continued on 05/27/2018 for 8 cycles.  He tolerated chemotherapy well except for chemo-induced anemia for which she has been getting Procrit every 2 weeks.  He did have response to his disease based on scans in August 2019.  However repeat scan on 05/31/2018 showed increase in the size of the primary tumor from 1.2 to 1.9 cm and increase in left para-aortic adenopathy from 0.9 to 1.3 cm.  Second line immunotherapy was recommended.  His initial biopsy specimen did not have enough sample to undergo FGFR mutation testing. He also has mild interstitial lung disease for which he sees pulmonary but he is not on home oxygen.  He has B12 deficiency for which he is on oral B12.  Also has diabetes and coronary artery disease. Patient noted to have disease progression in his lymph nodes in May 2020.  Repeat biopsy showed metastatic urothelial carcinoma which did not have a FGFR mutation.  He has been started on third line Padcev. There is still times when he feels fatigued but he is having more good days. He reports poor appetite. He was working with physical therapy on his foot drop, RLE weakness, and unsteadiness. He states that he gets considerable numbness for the 7-8 days following his chemotherapy treatment. He states that he is a diabetic and did have some numbness prior to starting chemotherapy. He states that he has a history of a low back disc bulge with RLE radicular symptoms. He used to get spinal injections but reports he hasn't had any injections in the last 10 years.    Limitations  Walking    Currently in Pain?  No/denies           TREATMENT   Ther-ex NuStep L2-4x 5 minutes total for warm-up during history (43mnutes unbilled); Precor BLE leg press 115# 3 x 20, Precor BLEheel raises 55# x 20, 70# x 20; Forward and lateral 6" step-ups without UE support x 10 each; Cross-over 6"  step-ups without UE support x 10 each with tactile cues to help pt remember sequencing; WHilton Hotelswith green tband around ankles 30' x 2; STS from chairwithno UE support 3kg med ball overhead press and Airex pad under feet 2 x 10;   Neuromuscular Re-education  Airex 6" alternating step taps without UE support x 10 each; Airex 12" alternating step taps without UE support x 10 each; Braiding right and left x 20' each direction; Gait in gym with lateral ball tosses to therapist right and left x 20' forward and backward toward each direction; Gait in gym with vertical ball tosses to self x 20' forward and backward; Gait in gym with lateral bounce passes to therapist right and left x 20' forward and backward toward each direction;   Pt educated throughout session about proper posture and technique with exercises. Improved exercise technique, movement at target joints, use of target muscles  after min to mod verbal, visual, tactile cues.   Ptdemonstrates excellent motivation during session today. He is able to increase the resistanceagainon the leg press today. He continues to demonstrate R foot drop which is a concern for increased fall risk but pt believes that it is getting better. He is able to complete all exercises as instructed today but with some fatigue.He struggles with sequencing during cross-over step-ups, braiding, and wedding marches. Pt encouraged to continue HEP and follow-up as scheduled. Hewould benefit from further skilled PT intervention to maximize safety, mobility, and independence.                       PT Short Term Goals - 08/27/19 1307      PT SHORT TERM GOAL #1   Title  Pt will be independent with HEP in order to improve strength and balance in order to decrease fall risk and improve function at home    Time  6    Period  Weeks    Status  On-going    Target Date  07/14/19        PT Long Term Goals - 10/08/19 1710      PT  LONG TERM GOAL #1   Title  Pt will improve BERG by at least 3 points in order to demonstrate clinically significant improvement in balance.    Time  12    Status  Achieved      PT LONG TERM GOAL #2   Title  Pt will improve ABC by at least 13% in order to demonstrate clinically significant improvement in balance confidence.    Baseline  06/02/19: 64.4%; 07/07/19: 65%; 08/27/19: 65%,, 10/08/19=70%    Time  12    Period  Weeks    Status  On-going    Target Date  11/19/19      PT LONG TERM GOAL #3   Title  Pt will increase 30s Sit to Stand Test to greater than or equal to 14 repetitions in order to demonstrate clinically significant improvement in LE endurance and decrease in fall risk.    Baseline  06/02/19: 10 repetitions; 07/07/19: 13 repetitions; 08/27/19: 14.5 repetitions    Status  Achieved      PT LONG TERM GOAL #4   Title  Pt will increase 6MWT by at least 142m(3238f in order to demonstrate clinically significant improvement in cardiopulmonary endurance and community ambulation    Baseline  06/10/20: 137538fno AD, no LOB, Rt foot slap without flat foot strike; 07/07/19: test terminated 2/2 fall; 07/09/19: 1565' with R AFO, no assistive device, mask donned; 08/27/19: 1615' with R AFO, no assistive, mask donned, 3/31/211600 ft    Time  12    Period  Weeks    Status  Partially Met    Target Date  11/19/19      PT LONG TERM GOAL #5   Title  Pt will improve FGA by at least 4 points in order to demonstrate clinically significant improvement in balance and decreased risk for falls.    Baseline  10/08/19=26/30    Period  Weeks    Status  Partially Met    Target Date  11/19/19            Plan - 10/16/19 1608    Clinical Impression Statement  Pt demonstrates excellent motivation during session today.  He is able to increase the resistance again on the leg press today. He continues to demonstrate R foot drop  which is a concern for increased fall risk but pt believes that it is getting  better. He is able to complete all exercises as instructed today but with some fatigue. He struggles with sequencing during cross-over step-ups, braiding, and wedding marches. Pt encouraged to continue HEP and follow-up as scheduled. He would benefit from further skilled PT intervention to maximize safety, mobility, and independence.    Personal Factors and Comorbidities  Age;Comorbidity 3+;Time since onset of injury/illness/exacerbation    Comorbidities  Urothelial cancer, DM, previous lumbar disc bulge, interstitial pulmonary disease    Examination-Activity Limitations  Locomotion Level;Stairs;Transfers    Examination-Participation Restrictions  Community Activity;Laundry;Shop    Stability/Clinical Decision Making  Evolving/Moderate complexity    Rehab Potential  Fair    PT Frequency  2x / week    PT Duration  12 weeks    PT Treatment/Interventions  ADLs/Self Care Home Management;Aquatic Therapy;Biofeedback;Canalith Repostioning;Cryotherapy;Iontophoresis 7m/ml Dexamethasone;Moist Heat;Traction;Ultrasound;DME Instruction;Gait training;Stair training;Functional mobility training;Therapeutic activities;Therapeutic exercise;Balance training;Neuromuscular re-education;Patient/family education;Orthotic Fit/Training;Manual techniques;Dry needling;Energy conservation;Vestibular;Joint Manipulations    PT Next Visit Plan  Continue with strengthening and balance    PT Home Exercise Plan  Access Code: [email or web address]   Consulted and Agree with Plan of Care  Patient       Patient will benefit from skilled therapeutic intervention in order to improve the following deficits and impairments:  Abnormal gait, Decreased activity tolerance, Decreased balance, Decreased strength  Visit Diagnosis: Unsteadiness on feet  Muscle weakness (generalized)     Problem List Patient Active Problem List   Diagnosis Date Noted  . Chest pain 03/04/2019  . Anemia due to antineoplastic chemotherapy 03/03/2018  . Cough  03/03/2018  . Chemotherapy induced neutropenia ([place] 01/22/2018  . B12 deficiency 01/01/2018  . Hypomagnesemia 12/21/2017  . Encounter for antineoplastic chemotherapy 12/21/2017  . Malignant neoplasm of kidney ([place]   . [place]ial [place] ([place] 12/18/2017  . Malnutrition of moderate degree 12/18/2017  . Therapeutic opioid induced constipation   . Palliative care encounter   . Dehydration 12/17/2017  . Goals of care, counseling/discussion 12/13/2017  . Urothelial carcinoma of kidney, left ([place] 12/07/2017  . Right inguinal hernia 07/17/2015  . Family history of colon cancer 07/17/2015  . Allergic rhinitis 11/12/2014  . Airway hyperreactivity 11/12/2014  . Atherosclerosis of coronary artery 11/12/2014  . Cheilitis 11/12/2014  . Narrowing of intervertebral disc space 11/12/2014  . Deflected nasal septum 11/12/2014  . HLD (hyperlipidemia) 11/12/2014  . BP (high blood pressure) 11/12/2014  . Adult hypothyroidism 11/12/2014  . Sleep apnea 11/12/2014  . Diabetes mellitus, type 2 ([place] 11/12/2014  . Degenerative disc disease, lumbar 09/24/2012  . Furunculosis 04/23/2012   JPhillips GroutPT, DPT, GCS  Taliana Mersereau 10/16/2019, 5:01 PM  [place] [facility]SERV[facility] [address] [address]Phone: [phone]  Fax:  [phone] Name: RKAIMANA LURZMRN: [id number] of Birth: [birth year]

## 2019-10-20 ENCOUNTER — Ambulatory Visit: Payer: Medicare Other

## 2019-10-20 ENCOUNTER — Other Ambulatory Visit: Payer: Self-pay

## 2019-10-20 DIAGNOSIS — M6281 Muscle weakness (generalized): Secondary | ICD-10-CM | POA: Diagnosis not present

## 2019-10-20 DIAGNOSIS — R2681 Unsteadiness on feet: Secondary | ICD-10-CM

## 2019-10-20 NOTE — Therapy (Signed)
[place] [facility] SERVICES [address] [place], [place], [address] Phone: [phone]   Fax:  [phone]  Physical Therapy Treatment  Patient Details  Name: Patrick Mcgee MRN: [id number] Date of Birth: February 15, 1946 Referring Provider (PT): Dr. Rosanna Randy   Encounter Date: 10/20/2019  PT End of Session - 10/21/19 0951    Visit Number  33    Number of Visits  49    Date for PT Re-Evaluation  11/19/19    PT Start Time  4315    PT Stop Time  1730    PT Time Calculation (min)  45 min    Equipment Utilized During Treatment  Gait belt    Activity Tolerance  Patient tolerated treatment well;No increased pain    Behavior During Therapy  WFL for tasks assessed/performed       Past Medical History:  Diagnosis Date  . Acid reflux   . Anxiety   . Depression   . Diabetes mellitus without complication ([place])   . Hyperlipidemia   . Hypertension   . MRSA (methicillin resistant Staphylococcus aureus) infection 1992   history  . Myocardial infarction ([place]) 1995  . Sleep apnea    CPAP  . Urothelial cancer ([place]) 11/2017   Left Urothelial mass, chemo tx's    Past Surgical History:  Procedure Laterality Date  . CATARACT EXTRACTION Left   . COLONOSCOPY  2010   [facility]  . COLONOSCOPY WITH PROPOFOL N/A 10/04/2016   Procedure: COLONOSCOPY WITH PROPOFOL;  Surgeon: [name], MD;  Location: [facility] ENDOSCOPY;  Service: Endoscopy;  Laterality: N/A;  . [place], 2000, 2001, 2014  . CYSTOSCOPY W/ RETROGRADES Bilateral 12/10/2017   Procedure: CYSTOSCOPY WITH RETROGRADE PYELOGRAM;  Surgeon: [name], MD;  Location: [facility] ORS;  Service: Urology;  Laterality: Bilateral;  . CYSTOSCOPY W/ RETROGRADES Left 12/09/2018   Procedure: CYSTOSCOPY WITH RETROGRADE PYELOGRAM;  Surgeon: [name], MD;  Location: [facility] ORS;  Service: Urology;  Laterality: Left;  . CYSTOSCOPY WITH BIOPSY Left 12/09/2018   Procedure:  CYSTOSCOPY WITH URETERAL/RENAL PELVIC BIOPSY;  Surgeon: [name], MD;  Location: [facility] ORS;  Service: Urology;  Laterality: Left;  . CYSTOSCOPY WITH STENT PLACEMENT Left 12/10/2017   Procedure: CYSTOSCOPY WITH STENT PLACEMENT;  Surgeon: [name], MD;  Location: [facility] ORS;  Service: Urology;  Laterality: Left;  . CYSTOSCOPY WITH STENT PLACEMENT Left 12/09/2018   Procedure: CYSTOSCOPY WITH STENT PLACEMENT;  Surgeon: [name], MD;  Location: [facility] ORS;  Service: Urology;  Laterality: Left;  . CYSTOSCOPY WITH URETEROSCOPY Left 12/09/2018   Procedure: CYSTOSCOPY WITH URETEROSCOPY;  Surgeon: [name], MD;  Location: [facility] ORS;  Service: Urology;  Laterality: Left;  . EYE SURGERY Bilateral    cataract  . INGUINAL HERNIA REPAIR Right 08/09/2015   Procedure: HERNIA REPAIR INGUINAL ADULT;  Surgeon: [name], MD;  Location: [facility] ORS;  Service: General;  Laterality: Right;  . NASAL SINUS SURGERY    . nuclear stress test    . PORTA CATH INSERTION N/A 12/19/2017   Procedure: PORTA CATH INSERTION;  Surgeon: [name], MD;  Location: [place] CV LAB;  Service: Cardiovascular;  Laterality: N/A;  . URETERAL BIOPSY Left 12/10/2017   Procedure: URETERAL & renal PELVIS BIOPSY;  Surgeon: [name], MD;  Location: [facility] ORS;  Service: Urology;  Laterality: Left;  . URETEROSCOPY Left 12/10/2017   Procedure: URETEROSCOPY;  Surgeon: [name], MD;  Location: [facility] ORS;  Service: Urology;  Laterality: Left;    There were no vitals filed for this visit.  Subjective Assessment - 10/20/19 1653    Subjective  Pt reports that he is doing well today. He denies any pain upon arrival. No falls since last therapy session. No specific questions upon arrival today.    Pertinent History  Part of history borrowed from oncology note and verified with patient: Patient is a 74 year old male who was referred for foot drop. He started with Dr. [name] so far for his metastatic urothelial  carcinoma.  This was originally diagnosed in May 2019. He was started on chemotherapy in June 2019 and was continued on 05/27/2018 for 8 cycles.  He tolerated chemotherapy well except for chemo-induced anemia for which she has been getting Procrit every 2 weeks.  He did have response to his disease based on scans in August 2019.  However repeat scan on 05/31/2018 showed increase in the size of the primary tumor from 1.2 to 1.9 cm and increase in left para-aortic adenopathy from 0.9 to 1.3 cm.  Second line immunotherapy was recommended.  His initial biopsy specimen did not have enough sample to undergo FGFR mutation testing. He also has mild interstitial lung disease for which he sees pulmonary but he is not on home oxygen.  He has B12 deficiency for which he is on oral B12.  Also has diabetes and coronary artery disease. Patient noted to have disease progression in his lymph nodes in May 2020.  Repeat biopsy showed metastatic urothelial carcinoma which did not have a FGFR mutation.  He has been started on third line Padcev. There is still times when he feels fatigued but he is having more good days. He reports poor appetite. He was working with physical therapy on his foot drop, RLE weakness, and unsteadiness. He states that he gets considerable numbness for the 7-8 days following his chemotherapy treatment. He states that he is a diabetic and did have some numbness prior to starting chemotherapy. He states that he has a history of a low back disc bulge with RLE radicular symptoms. He used to get spinal injections but reports he hasn't had any injections in the last 10 years.    Limitations  Walking    Currently in Pain?  No/denies          TREATMENT   Ther-ex Octane xRide 45s intervals L5/10 for warm-up during history (66mnutes unbilled); Precor BLE leg press 115# x 20, 130# x 15; Precor BLEheel raises 70# x 20, 85# x 20; 3kg med ball chest passes while exploding up with sit to stand x 10; 3kg  med ball floor slams with catch on bounce x 10; Wedding marches with green tband around ankles 30' x 2;   Neuromuscular Re-education  1/2 foam roll (flat side up) balance 30s x 2; 1/2 foam roll (flat side up) heel/toe rocking x 30s; 1/2 foam roll (flat side up) tandem balance alternating forward LE 2 x 30s each; Braiding right and left 6' x 2 each direction, extensive verbal and tactile cues for proper sequencing; Gait in gym with lateral ball tosses to therapist right and left x 230' forward and backward toward each direction; Gait in gym with vertical ball tosses to self x 30' forward and backward;   Pt educated throughout session about proper posture and technique with exercises. Improved exercise technique, movement at target joints, use of target muscles after min to mod verbal, visual, tactile cues.   Ptdemonstrates excellent motivation during session today. He is  able to increase the resistanceagainon the leg press today. Also continued with explosive exercises such as med ball chest passes and slams. He continues to demonstrate R foot drop which is a concern for increased fall risk and therapist continues to recommend R AFO. He is able to complete all exercises as instructed today but with some fatigue.He struggles with sequencing during cross-over step-ups and braiding. Better sequencing today with wedding marches. Pt encouraged to continue HEP and follow-up as scheduled. Hewould benefit from further skilled PT intervention to maximize safety, mobility, and independence.                        PT Short Term Goals - 08/27/19 1307      PT SHORT TERM GOAL #1   Title  Pt will be independent with HEP in order to improve strength and balance in order to decrease fall risk and improve function at home    Time  6    Period  Weeks    Status  On-going    Target Date  07/14/19        PT Long Term Goals - 10/08/19 1710      PT LONG TERM GOAL #1   Title   Pt will improve BERG by at least 3 points in order to demonstrate clinically significant improvement in balance.    Time  12    Status  Achieved      PT LONG TERM GOAL #2   Title  Pt will improve ABC by at least 13% in order to demonstrate clinically significant improvement in balance confidence.    Baseline  06/02/19: 64.4%; 07/07/19: 65%; 08/27/19: 65%,, 10/08/19=70%    Time  12    Period  Weeks    Status  On-going    Target Date  11/19/19      PT LONG TERM GOAL #3   Title  Pt will increase 30s Sit to Stand Test to greater than or equal to 14 repetitions in order to demonstrate clinically significant improvement in LE endurance and decrease in fall risk.    Baseline  06/02/19: 10 repetitions; 07/07/19: 13 repetitions; 08/27/19: 14.5 repetitions    Status  Achieved      PT LONG TERM GOAL #4   Title  Pt will increase 6MWT by at least 162m(3257f in order to demonstrate clinically significant improvement in cardiopulmonary endurance and community ambulation    Baseline  06/10/20: 137536fno AD, no LOB, Rt foot slap without flat foot strike; 07/07/19: test terminated 2/2 fall; 07/09/19: 1565' with R AFO, no assistive device, mask donned; 08/27/19: 1615' with R AFO, no assistive, mask donned, 3/31/211600 ft    Time  12    Period  Weeks    Status  Partially Met    Target Date  11/19/19      PT LONG TERM GOAL #5   Title  Pt will improve FGA by at least 4 points in order to demonstrate clinically significant improvement in balance and decreased risk for falls.    Baseline  10/08/19=26/30    Period  Weeks    Status  Partially Met    Target Date  11/19/19            Plan - 10/21/19 [id number] Clinical Impression Statement  Pt demonstrates excellent motivation during session today.  He is able to increase the resistance again on the leg press today. Also continued with explosive exercises such as med  ball chest passes and slams. He continues to demonstrate R foot drop which is a concern for  increased fall risk and therapist continues to recommend R AFO. He is able to complete all exercises as instructed today but with some fatigue. He struggles with sequencing during cross-over step-ups and braiding. Better sequencing today with wedding marches. Pt encouraged to continue HEP and follow-up as scheduled. He would benefit from further skilled PT intervention to maximize safety, mobility, and independence.    Personal Factors and Comorbidities  Age;Comorbidity 3+;Time since onset of injury/illness/exacerbation    Comorbidities  Urothelial cancer, DM, previous lumbar disc bulge, interstitial pulmonary disease    Examination-Activity Limitations  Locomotion Level;Stairs;Transfers    Examination-Participation Restrictions  Community Activity;Laundry;Shop    Stability/Clinical Decision Making  Evolving/Moderate complexity    Rehab Potential  Fair    PT Frequency  2x / week    PT Duration  12 weeks    PT Treatment/Interventions  ADLs/Self Care Home Management;Aquatic Therapy;Biofeedback;Canalith Repostioning;Cryotherapy;Iontophoresis '4mg'$ /ml Dexamethasone;Moist Heat;Traction;Ultrasound;DME Instruction;Gait training;Stair training;Functional mobility training;Therapeutic activities;Therapeutic exercise;Balance training;Neuromuscular re-education;Patient/family education;Orthotic Fit/Training;Manual techniques;Dry needling;Energy conservation;Vestibular;Joint Manipulations    PT Next Visit Plan  Continue with strengthening and balance    PT Home Exercise Plan  Access Code: [email or web address]    Consulted and Agree with Plan of Care  Patient       Patient will benefit from skilled therapeutic intervention in order to improve the following deficits and impairments:  Abnormal gait, Decreased activity tolerance, Decreased balance, Decreased strength  Visit Diagnosis: Unsteadiness on feet  Muscle weakness (generalized)     Problem List Patient Active Problem List   Diagnosis Date Noted  . Chest pain  03/04/2019  . Anemia due to antineoplastic chemotherapy 03/03/2018  . Cough 03/03/2018  . Chemotherapy induced neutropenia ([place]) 01/22/2018  . B12 deficiency 01/01/2018  . Hypomagnesemia 12/21/2017  . Encounter for antineoplastic chemotherapy 12/21/2017  . Malignant neoplasm of kidney ([place])   . Urothelial cancer ([place]) 12/18/2017  . Malnutrition of moderate degree 12/18/2017  . Therapeutic opioid induced constipation   . Palliative care encounter   . Dehydration 12/17/2017  . Goals of care, counseling/discussion 12/13/2017  . Urothelial carcinoma of kidney, left ([place]) 12/07/2017  . Right inguinal hernia 07/17/2015  . Family history of colon cancer 07/17/2015  . Allergic rhinitis 11/12/2014  . Airway hyperreactivity 11/12/2014  . Atherosclerosis of coronary artery 11/12/2014  . Cheilitis 11/12/2014  . Narrowing of intervertebral disc space 11/12/2014  . Deflected nasal septum 11/12/2014  . HLD (hyperlipidemia) 11/12/2014  . BP (high blood pressure) 11/12/2014  . Adult hypothyroidism 11/12/2014  . Sleep apnea 11/12/2014  . Diabetes mellitus, type 2 ([place]) 11/12/2014  . Degenerative disc disease, lumbar 09/24/2012  . Furunculosis 04/23/2012   Phillips Grout PT, DPT, GCS  Sherisa Gilvin 10/21/2019, 9:56 AM  [place] [facility] SERVICES [address] [place], [place], [address] Phone: [phone]   Fax:  [phone]  Name: Patrick Mcgee MRN: [id number] Date of Birth: 1946/04/04

## 2019-10-21 ENCOUNTER — Inpatient Hospital Stay: Payer: Medicare Other | Attending: Oncology

## 2019-10-21 ENCOUNTER — Other Ambulatory Visit: Payer: Self-pay

## 2019-10-21 DIAGNOSIS — D701 Agranulocytosis secondary to cancer chemotherapy: Secondary | ICD-10-CM

## 2019-10-21 DIAGNOSIS — R5382 Chronic fatigue, unspecified: Secondary | ICD-10-CM | POA: Insufficient documentation

## 2019-10-21 DIAGNOSIS — C642 Malignant neoplasm of left kidney, except renal pelvis: Secondary | ICD-10-CM | POA: Diagnosis not present

## 2019-10-21 DIAGNOSIS — E538 Deficiency of other specified B group vitamins: Secondary | ICD-10-CM | POA: Insufficient documentation

## 2019-10-21 DIAGNOSIS — G620 Drug-induced polyneuropathy: Secondary | ICD-10-CM | POA: Diagnosis not present

## 2019-10-21 LAB — CBC WITH DIFFERENTIAL/PLATELET
Abs Immature Granulocytes: 0.01 10*3/uL (ref 0.00–0.07)
Basophils Absolute: 0.1 10*3/uL (ref 0.0–0.1)
Basophils Relative: 1 %
Eosinophils Absolute: 0.6 10*3/uL — ABNORMAL HIGH (ref 0.0–0.5)
Eosinophils Relative: 10 %
HCT: 39.5 % (ref 39.0–52.0)
Hemoglobin: 13.1 g/dL (ref 13.0–17.0)
Immature Granulocytes: 0 %
Lymphocytes Relative: 25 %
Lymphs Abs: 1.6 10*3/uL (ref 0.7–4.0)
MCH: 29.1 pg (ref 26.0–34.0)
MCHC: 33.2 g/dL (ref 30.0–36.0)
MCV: 87.8 fL (ref 80.0–100.0)
Monocytes Absolute: 0.6 10*3/uL (ref 0.1–1.0)
Monocytes Relative: 9 %
Neutro Abs: 3.4 10*3/uL (ref 1.7–7.7)
Neutrophils Relative %: 55 %
Platelets: 217 10*3/uL (ref 150–400)
RBC: 4.5 MIL/uL (ref 4.22–5.81)
RDW: 16 % — ABNORMAL HIGH (ref 11.5–15.5)
WBC: 6.4 10*3/uL (ref 4.0–10.5)
nRBC: 0 % (ref 0.0–0.2)

## 2019-10-21 LAB — COMPREHENSIVE METABOLIC PANEL
ALT: 17 U/L (ref 0–44)
AST: 18 U/L (ref 15–41)
Albumin: 4.2 g/dL (ref 3.5–5.0)
Alkaline Phosphatase: 100 U/L (ref 38–126)
Anion gap: 11 (ref 5–15)
BUN: 16 mg/dL (ref 8–23)
CO2: 28 mmol/L (ref 22–32)
Calcium: 9.9 mg/dL (ref 8.9–10.3)
Chloride: 99 mmol/L (ref 98–111)
Creatinine, Ser: 0.77 mg/dL (ref 0.61–1.24)
GFR calc Af Amer: >60 mL/min (ref >60)
GFR calc non Af Amer: >60 mL/min (ref >60)
Glucose, Bld: 134 mg/dL — ABNORMAL HIGH (ref 70–99)
Potassium: 4.2 mmol/L (ref 3.5–5.1)
Sodium: 138 mmol/L (ref 135–145)
Total Bilirubin: 1.2 mg/dL (ref 0.3–1.2)
Total Protein: 7.4 g/dL (ref 6.5–8.1)

## 2019-10-22 ENCOUNTER — Ambulatory Visit: Payer: Medicare Other

## 2019-10-22 ENCOUNTER — Other Ambulatory Visit: Payer: Self-pay

## 2019-10-22 DIAGNOSIS — M6281 Muscle weakness (generalized): Secondary | ICD-10-CM | POA: Diagnosis not present

## 2019-10-22 DIAGNOSIS — R2681 Unsteadiness on feet: Secondary | ICD-10-CM | POA: Diagnosis not present

## 2019-10-22 NOTE — Therapy (Signed)
[place] [facility] [address] [place], [place], [address] Phone: [phone]   Fax:  [phone]  Physical Therapy Treatment  Patient Details  Name: Patrick Mcgee MRN: [id number] Date of Birth: [birth year] Referring Provider (PT): Dr. Rosanna Randy   Encounter Date: 10/22/2019  PT End of Session - 10/22/19 1657    Visit Number  34    Number of Visits  49    Date for PT Re-Evaluation  11/19/19    PT Start Time  1600    PT Stop Time  1646    PT Time Calculation (min)  46 min    Equipment Utilized During Treatment  Gait belt    Activity Tolerance  Patient tolerated treatment well;No increased pain    Behavior During Therapy  WFL for tasks assessed/performed       Past Medical History:  Diagnosis Date  . Acid reflux   . Anxiety   . Depression   . Diabetes mellitus without complication ([place])   . Hyperlipidemia   . Hypertension   . MRSA (methicillin resistant Staphylococcus aureus) infection 1992   history  . Myocardial infarction ([place]) 1995  . Sleep apnea    CPAP  . Urothelial cancer ([place]) 11/2017   Left Urothelial mass, chemo tx's    Past Surgical History:  Procedure Laterality Date  . CATARACT EXTRACTION Left   . COLONOSCOPY  2010   [facility]  . COLONOSCOPY WITH PROPOFOL N/A 10/04/2016   Procedure: COLONOSCOPY WITH PROPOFOL;  Surgeon: [name], MD;  Location: [facility] ENDOSCOPY;  Service: Endoscopy;  Laterality: N/A;  . [place], 2000, 2001, 2014  . CYSTOSCOPY W/ RETROGRADES Bilateral 12/10/2017   Procedure: CYSTOSCOPY WITH RETROGRADE PYELOGRAM;  Surgeon: [name], MD;  Location: [facility] ORS;  Service: Urology;  Laterality: Bilateral;  . CYSTOSCOPY W/ RETROGRADES Left 12/09/2018   Procedure: CYSTOSCOPY WITH RETROGRADE PYELOGRAM;  Surgeon: [name], MD;  Location: [facility] ORS;  Service: Urology;  Laterality: Left;  . CYSTOSCOPY WITH BIOPSY Left 12/09/2018   Procedure:  CYSTOSCOPY WITH URETERAL/RENAL PELVIC BIOPSY;  Surgeon: [name], MD;  Location: [facility] ORS;  Service: Urology;  Laterality: Left;  . CYSTOSCOPY WITH STENT PLACEMENT Left 12/10/2017   Procedure: CYSTOSCOPY WITH STENT PLACEMENT;  Surgeon: [name], MD;  Location: [facility] ORS;  Service: Urology;  Laterality: Left;  . CYSTOSCOPY WITH STENT PLACEMENT Left 12/09/2018   Procedure: CYSTOSCOPY WITH STENT PLACEMENT;  Surgeon: [name], MD;  Location: [facility] ORS;  Service: Urology;  Laterality: Left;  . CYSTOSCOPY WITH URETEROSCOPY Left 12/09/2018   Procedure: CYSTOSCOPY WITH URETEROSCOPY;  Surgeon: [name], MD;  Location: [facility] ORS;  Service: Urology;  Laterality: Left;  . EYE SURGERY Bilateral    cataract  . INGUINAL HERNIA REPAIR Right 08/09/2015   Procedure: HERNIA REPAIR INGUINAL ADULT;  Surgeon: [name], MD;  Location: [facility] ORS;  Service: General;  Laterality: Right;  . NASAL SINUS SURGERY    . nuclear stress test    . PORTA CATH INSERTION N/A 12/19/2017   Procedure: PORTA CATH INSERTION;  Surgeon: [name], MD;  Location: [facility];  Service: Cardiovascular;  Laterality: N/A;  . URETERAL BIOPSY Left 12/10/2017   Procedure: URETERAL & renal PELVIS BIOPSY;  Surgeon: [name], MD;  Location: [facility] ORS;  Service: Urology;  Laterality: Left;  . URETEROSCOPY Left 12/10/2017   Procedure: URETEROSCOPY;  Surgeon: [name], MD;  Location: [facility] ORS;  Service: Urology;  Laterality: Left;    There were no vitals filed for this visit.  Subjective Assessment - 10/22/19 1657    Subjective  Patient reports he has a CT scan tomorrow. No pain or LOB since last session. Has been compliant with his exercises.    Pertinent History  Part of history borrowed from oncology note and verified with patient: Patient is a 74 year old male who was referred for foot drop. He started with Dr. [name] so far for his metastatic urothelial carcinoma.  This was originally diagnosed  in May 2019. He was started on chemotherapy in June 2019 and was continued on 05/27/2018 for 8 cycles.  He tolerated chemotherapy well except for chemo-induced anemia for which she has been getting Procrit every 2 weeks.  He did have response to his disease based on scans in August 2019.  However repeat scan on 05/31/2018 showed increase in the size of the primary tumor from 1.2 to 1.9 cm and increase in left para-aortic adenopathy from 0.9 to 1.3 cm.  Second line immunotherapy was recommended.  His initial biopsy specimen did not have enough sample to undergo FGFR mutation testing. He also has mild interstitial lung disease for which he sees pulmonary but he is not on home oxygen.  He has B12 deficiency for which he is on oral B12.  Also has diabetes and coronary artery disease. Patient noted to have disease progression in his lymph nodes in May 2020.  Repeat biopsy showed metastatic urothelial carcinoma which did not have a FGFR mutation.  He has been started on third line Padcev. There is still times when he feels fatigued but he is having more good days. He reports poor appetite. He was working with physical therapy on his foot drop, RLE weakness, and unsteadiness. He states that he gets considerable numbness for the 7-8 days following his chemotherapy treatment. He states that he is a diabetic and did have some numbness prior to starting chemotherapy. He states that he has a history of a low back disc bulge with RLE radicular symptoms. He used to get spinal injections but reports he hasn't had any injections in the last 10 years.    Limitations  Walking    Currently in Pain?  No/denies             TREATMENT     Ther-ex  Nustep Lvl 5 3 minutes for cardiovascular challenge  Seated on plinth table: Sit to stand squats with 5lb overhead press 10x Single limb stance with opp LE in plantarflexion 10x each side with raised plinth table Sit to stand with front squat holding 5lb bar in crook of elbow  10x Sit to stand into plantarflexion 8x, very very challenging  RTB df, pf, eversion, inversion 10x added to HEP   Neuromuscular Re-education  airex pad:  One foot on airex pad: one foot on 6" step static hold 30 seconds each position, x 2 trials each LE ; challenging to posterior ankle stability Modified tandem stance tossing ball at target x20 each position,   airex pad 6" step toe tape SUE support x10 each LE airex pad: cones  Tapping with decreasing UE support, cues for softly tapping for stability/mobility  Standing with UE support: bilateral heel raise, unilateral eccentric back to standard.    Pt educated throughout session about proper posture and technique with exercises. Improved exercise technique, movement at target joints, use of target muscles after min to mod verbal, visual, tactile cues.      Access Code: [email or web address]  URL: [email or web address] Date: 10/22/2019 Prepared by: Janna Arch  Exercises Seated Ankle Dorsiflexion with Resistance - 1 x daily - 7 x weekly - 2 sets - 10 reps - 5 hold Seated Ankle Inversion with Anchored Resistance - 1 x daily - 7 x weekly - 2 sets - 10 reps - 5 hold Seated Ankle Eversion with Resistance - 1 x daily - 7 x weekly - 10 reps - 2 sets - 5 hold Seated Ankle Eversion with Anchored Resistance - 1 x daily - 7 x weekly - 10 reps - 2 sets - 5 hold     Patient demonstrates excellent motivation throughout physical therapy session. Focus on stability with mobility and ankle righting reactions is challenging for patient. Ankle righting reactions are challenged by unstable surfaces and single limb stance at this time. He verbalized potentially being open to R AFO but only for events that require him to stand for a long time. He would benefit from further skilled PT intervention to maximize safety, mobility, and independence          PT Education - 10/22/19 1657    Education Details  exercise technique, HEP    Person(s)  Educated  Patient    Methods  Explanation;Demonstration;Tactile cues;Verbal cues    Comprehension  Verbalized understanding;Returned demonstration;Verbal cues required;Tactile cues required       PT Short Term Goals - 08/27/19 1307      PT SHORT TERM GOAL #1   Title  Pt will be independent with HEP in order to improve strength and balance in order to decrease fall risk and improve function at home    Time  6    Period  Weeks    Status  On-going    Target Date  07/14/19        PT Long Term Goals - 10/08/19 1710      PT LONG TERM GOAL #1   Title  Pt will improve BERG by at least 3 points in order to demonstrate clinically significant improvement in balance.    Time  12    Status  Achieved      PT LONG TERM GOAL #2   Title  Pt will improve ABC by at least 13% in order to demonstrate clinically significant improvement in balance confidence.    Baseline  06/02/19: 64.4%; 07/07/19: 65%; 08/27/19: 65%,, 10/08/19=70%    Time  12    Period  Weeks    Status  On-going    Target Date  11/19/19      PT LONG TERM GOAL #3   Title  Pt will increase 30s Sit to Stand Test to greater than or equal to 14 repetitions in order to demonstrate clinically significant improvement in LE endurance and decrease in fall risk.    Baseline  06/02/19: 10 repetitions; 07/07/19: 13 repetitions; 08/27/19: 14.5 repetitions    Status  Achieved      PT LONG TERM GOAL #4   Title  Pt will increase 6MWT by at least 18m(3225f in order to demonstrate clinically significant improvement in cardiopulmonary endurance and community ambulation    Baseline  06/10/20: 13758fno AD, no LOB, Rt foot slap without flat foot strike; 07/07/19: test terminated 2/2 fall; 07/09/19: 1565' with R AFO, no assistive device, mask donned; 08/27/19: 1615' with R AFO, no assistive, mask donned, 3/31/211600 ft    Time  12    Period  Weeks    Status  Partially Met    Target Date  11/19/19  PT LONG TERM GOAL #5   Title  Pt will improve  FGA by at least 4 points in order to demonstrate clinically significant improvement in balance and decreased risk for falls.    Baseline  10/08/19=26/30    Period  Weeks    Status  Partially Met    Target Date  11/19/19            Plan - 10/22/19 1659    Clinical Impression Statement  Patient demonstrates excellent motivation throughout physical therapy session. Focus on stability with mobility and ankle righting reactions is challenging for patient. Ankle righting reactions are challenged by unstable surfaces and single limb stance at this time. He verbalized potentially being open to R AFO but only for events that require him to stand for a long time. He would benefit from further skilled PT intervention to maximize safety, mobility, and independence    Personal Factors and Comorbidities  Age;Comorbidity 3+;Time since onset of injury/illness/exacerbation    Comorbidities  Urothelial cancer, DM, previous lumbar disc bulge, interstitial pulmonary disease    Examination-Activity Limitations  Locomotion Level;Stairs;Transfers    Examination-Participation Restrictions  Community Activity;Laundry;Shop    Stability/Clinical Decision Making  Evolving/Moderate complexity    Rehab Potential  Fair    PT Frequency  2x / week    PT Duration  12 weeks    PT Treatment/Interventions  ADLs/Self Care Home Management;Aquatic Therapy;Biofeedback;Canalith Repostioning;Cryotherapy;Iontophoresis '4mg'$ /ml Dexamethasone;Moist Heat;Traction;Ultrasound;DME Instruction;Gait training;Stair training;Functional mobility training;Therapeutic activities;Therapeutic exercise;Balance training;Neuromuscular re-education;Patient/family education;Orthotic Fit/Training;Manual techniques;Dry needling;Energy conservation;Vestibular;Joint Manipulations    PT Next Visit Plan  Continue with strengthening and balance    PT Home Exercise Plan  Access Code: [email or web address]    Consulted and Agree with Plan of Care  Patient       Patient will  benefit from skilled therapeutic intervention in order to improve the following deficits and impairments:  Abnormal gait, Decreased activity tolerance, Decreased balance, Decreased strength  Visit Diagnosis: Unsteadiness on feet  Muscle weakness (generalized)     Problem List Patient Active Problem List   Diagnosis Date Noted  . Chest pain 03/04/2019  . Anemia due to antineoplastic chemotherapy 03/03/2018  . Cough 03/03/2018  . Chemotherapy induced neutropenia ([place]) 01/22/2018  . B12 deficiency 01/01/2018  . Hypomagnesemia 12/21/2017  . Encounter for antineoplastic chemotherapy 12/21/2017  . Malignant neoplasm of kidney ([place])   . Urothelial cancer ([place]) 12/18/2017  . Malnutrition of moderate degree 12/18/2017  . Therapeutic opioid induced constipation   . Palliative care encounter   . Dehydration 12/17/2017  . Goals of care, counseling/discussion 12/13/2017  . Urothelial carcinoma of kidney, left ([place]) 12/07/2017  . Right inguinal hernia 07/17/2015  . Family history of colon cancer 07/17/2015  . Allergic rhinitis 11/12/2014  . Airway hyperreactivity 11/12/2014  . Atherosclerosis of coronary artery 11/12/2014  . Cheilitis 11/12/2014  . Narrowing of intervertebral disc space 11/12/2014  . Deflected nasal septum 11/12/2014  . HLD (hyperlipidemia) 11/12/2014  . BP (high blood pressure) 11/12/2014  . Adult hypothyroidism 11/12/2014  . Sleep apnea 11/12/2014  . Diabetes mellitus, type 2 ([place]) 11/12/2014  . Degenerative disc disease, lumbar 09/24/2012  . Furunculosis 04/23/2012   Janna Arch, PT, DPT   10/22/2019, 4:59 PM  [place] [facility] [address] [place], [place], [address] Phone: [phone]   Fax:  [phone]  Name: Patrick Mcgee MRN: [id number] Date of Birth: [birth year]

## 2019-10-24 ENCOUNTER — Ambulatory Visit
Admission: RE | Admit: 2019-10-24 | Discharge: 2019-10-24 | Disposition: A | Discharge status: Home / Self Care | Payer: Medicare Other | Source: Ambulatory Visit | Attending: Oncology | Admitting: Oncology

## 2019-10-24 ENCOUNTER — Other Ambulatory Visit: Payer: Self-pay

## 2019-10-24 DIAGNOSIS — C689 Malignant neoplasm of urinary organ, unspecified: Secondary | ICD-10-CM | POA: Diagnosis not present

## 2019-10-24 DIAGNOSIS — C642 Malignant neoplasm of left kidney, except renal pelvis: Secondary | ICD-10-CM | POA: Diagnosis not present

## 2019-10-24 DIAGNOSIS — C679 Malignant neoplasm of bladder, unspecified: Secondary | ICD-10-CM | POA: Diagnosis not present

## 2019-10-24 MED ORDER — IOHEXOL 300 MG/ML  SOLN
100.0000 mL | Freq: Once | INTRAMUSCULAR | Status: AC | PRN
  Administered 2019-10-24: 12:00:00 100 mL via INTRAVENOUS

## 2019-10-27 ENCOUNTER — Ambulatory Visit: Payer: Medicare Other

## 2019-10-27 ENCOUNTER — Other Ambulatory Visit: Payer: Self-pay

## 2019-10-27 DIAGNOSIS — M6281 Muscle weakness (generalized): Secondary | ICD-10-CM

## 2019-10-27 DIAGNOSIS — R2681 Unsteadiness on feet: Secondary | ICD-10-CM | POA: Diagnosis not present

## 2019-10-27 NOTE — Therapy (Signed)
[place] MAIN [facility] SERVICES [address] [place], [place], [address] Phone: [phone]   Fax:  [phone]  Physical Therapy Treatment  Patient Details  Name: Patrick Mcgee MRN: [id number] Date of Birth: Nov 19, 1945 Referring Provider (PT): Dr. Rosanna Randy   Encounter Date: 10/27/2019  PT End of Session - 10/27/19 1653    Visit Number  35    Number of Visits  49    Date for PT Re-Evaluation  11/19/19    PT Start Time  5498    PT Stop Time  1730    PT Time Calculation (min)  45 min    Equipment Utilized During Treatment  Gait belt    Activity Tolerance  Patient tolerated treatment well;No increased pain    Behavior During Therapy  WFL for tasks assessed/performed       Past Medical History:  Diagnosis Date  . Acid reflux   . Anxiety   . Depression   . Diabetes mellitus without complication ([place])   . Hyperlipidemia   . Hypertension   . MRSA (methicillin resistant Staphylococcus aureus) infection 1992   history  . Myocardial infarction ([place]) 1995  . Sleep apnea    CPAP  . Urothelial cancer ([place]) 11/2017   Left Urothelial mass, chemo tx's    Past Surgical History:  Procedure Laterality Date  . CATARACT EXTRACTION Left   . COLONOSCOPY  2010   [facility]  . COLONOSCOPY WITH PROPOFOL N/A 10/04/2016   Procedure: COLONOSCOPY WITH PROPOFOL;  Surgeon: [name], MD;  Location: [facility] ENDOSCOPY;  Service: Endoscopy;  Laterality: N/A;  . [place], 2000, 2001, 2014  . CYSTOSCOPY W/ RETROGRADES Bilateral 12/10/2017   Procedure: CYSTOSCOPY WITH RETROGRADE PYELOGRAM;  Surgeon: [name], MD;  Location: [facility] ORS;  Service: Urology;  Laterality: Bilateral;  . CYSTOSCOPY W/ RETROGRADES Left 12/09/2018   Procedure: CYSTOSCOPY WITH RETROGRADE PYELOGRAM;  Surgeon: [name], MD;  Location: [facility] ORS;  Service: Urology;  Laterality: Left;  . CYSTOSCOPY WITH BIOPSY Left 12/09/2018   Procedure:  CYSTOSCOPY WITH URETERAL/RENAL PELVIC BIOPSY;  Surgeon: [name], MD;  Location: [facility] ORS;  Service: Urology;  Laterality: Left;  . CYSTOSCOPY WITH STENT PLACEMENT Left 12/10/2017   Procedure: CYSTOSCOPY WITH STENT PLACEMENT;  Surgeon: [name], MD;  Location: [facility] ORS;  Service: Urology;  Laterality: Left;  . CYSTOSCOPY WITH STENT PLACEMENT Left 12/09/2018   Procedure: CYSTOSCOPY WITH STENT PLACEMENT;  Surgeon: [name], MD;  Location: [facility] ORS;  Service: Urology;  Laterality: Left;  . CYSTOSCOPY WITH URETEROSCOPY Left 12/09/2018   Procedure: CYSTOSCOPY WITH URETEROSCOPY;  Surgeon: [name], MD;  Location: [facility] ORS;  Service: Urology;  Laterality: Left;  . EYE SURGERY Bilateral    cataract  . INGUINAL HERNIA REPAIR Right 08/09/2015   Procedure: HERNIA REPAIR INGUINAL ADULT;  Surgeon: [name], MD;  Location: [facility] ORS;  Service: General;  Laterality: Right;  . NASAL SINUS SURGERY    . nuclear stress test    . PORTA CATH INSERTION N/A 12/19/2017   Procedure: PORTA CATH INSERTION;  Surgeon: [name], MD;  Location: [place] CV LAB;  Service: Cardiovascular;  Laterality: N/A;  . URETERAL BIOPSY Left 12/10/2017   Procedure: URETERAL & renal PELVIS BIOPSY;  Surgeon: [name], MD;  Location: [facility] ORS;  Service: Urology;  Laterality: Left;  . URETEROSCOPY Left 12/10/2017   Procedure: URETEROSCOPY;  Surgeon: [name], MD;  Location: [facility] ORS;  Service: Urology;  Laterality: Left;    There were no vitals filed for this visit.  Subjective Assessment - 10/27/19 1644    Subjective  Pt reports that he keeps forgetting to do his HEP. He had his appt with the orthotist and was fitted for an AFO. He should pick it up in a couple weeks. No pain reported upon arrival. No falls since last therapy session.    Pertinent History  Part of history borrowed from oncology note and verified with patient: Patient is a 74 year old male who was referred for foot drop. He  started with Dr. [name] so far for his metastatic urothelial carcinoma.  This was originally diagnosed in May 2019. He was started on chemotherapy in June 2019 and was continued on 05/27/2018 for 8 cycles.  He tolerated chemotherapy well except for chemo-induced anemia for which she has been getting Procrit every 2 weeks.  He did have response to his disease based on scans in August 2019.  However repeat scan on 05/31/2018 showed increase in the size of the primary tumor from 1.2 to 1.9 cm and increase in left para-aortic adenopathy from 0.9 to 1.3 cm.  Second line immunotherapy was recommended.  His initial biopsy specimen did not have enough sample to undergo FGFR mutation testing. He also has mild interstitial lung disease for which he sees pulmonary but he is not on home oxygen.  He has B12 deficiency for which he is on oral B12.  Also has diabetes and coronary artery disease. Patient noted to have disease progression in his lymph nodes in May 2020.  Repeat biopsy showed metastatic urothelial carcinoma which did not have a FGFR mutation.  He has been started on third line Padcev. There is still times when he feels fatigued but he is having more good days. He reports poor appetite. He was working with physical therapy on his foot drop, RLE weakness, and unsteadiness. He states that he gets considerable numbness for the 7-8 days following his chemotherapy treatment. He states that he is a diabetic and did have some numbness prior to starting chemotherapy. He states that he has a history of a low back disc bulge with RLE radicular symptoms. He used to get spinal injections but reports he hasn't had any injections in the last 10 years.    Limitations  Walking    Currently in Pain?  No/denies         TREATMENT   Ther-ex Octane xRide intervals L5/10, 45s each x 5 minutes for warm-up during history (2 minutes unbilled);  Sit to stand from mat table with 5lb overhead press x 10, Airex pad under feet x  10;  Precor BLE leg press 120#x 20, 130# x 20, pt requires assist for first repetition of second set; Precor BLEheel raises 85# x x 20;  Wedding marches with green tband around [place]' x 2; Lateral squat stepping with green tband around ankles 30' x 2; Seated R ankle red tband DF, Inv, Ev x 15 each;   Neuromuscular Re-education Airex NBOS eyes closed x 30s; Airex NBOS horizontal and vertical head turns x 30s each; Gait in hallway with lateral ball tosses to therapist right and left x 75' forward and backward to each side with both forward and backward; Gait in hallway forward and bckward with vertical ball tosses to self x 75' each direction;   Pt educated throughout session about proper posture and technique with exercises. Improved exercise technique, movement at target joints, use of target muscles after min to mod  verbal, visual, tactile cues.    Pt demonstrates excellent motivation during session today. His R ankle appears to be getting mildly stronger however he continues to demonstrate increased weakness with R ankle dorsiflexion and eversion. He continues to demonstrate lateral veering with head turns during ball tosses with ambulation. Continued to work on leg press and heel raises with patient as well as hip abductor strengthening. Pt heavily encouraged to perform HEP at home. Pt will benefit from PT services to address deficits in strength, balance, and mobility in order to return to full function at home.                       PT Short Term Goals - 08/27/19 1307      PT SHORT TERM GOAL #1   Title  Pt will be independent with HEP in order to improve strength and balance in order to decrease fall risk and improve function at home    Time  6    Period  Weeks    Status  On-going    Target Date  07/14/19        PT Long Term Goals - 10/08/19 1710      PT LONG TERM GOAL #1   Title  Pt will improve BERG by at least 3 points in order to demonstrate  clinically significant improvement in balance.    Time  12    Status  Achieved      PT LONG TERM GOAL #2   Title  Pt will improve ABC by at least 13% in order to demonstrate clinically significant improvement in balance confidence.    Baseline  06/02/19: 64.4%; 07/07/19: 65%; 08/27/19: 65%,, 10/08/19=70%    Time  12    Period  Weeks    Status  On-going    Target Date  11/19/19      PT LONG TERM GOAL #3   Title  Pt will increase 30s Sit to Stand Test to greater than or equal to 14 repetitions in order to demonstrate clinically significant improvement in LE endurance and decrease in fall risk.    Baseline  06/02/19: 10 repetitions; 07/07/19: 13 repetitions; 08/27/19: 14.5 repetitions    Status  Achieved      PT LONG TERM GOAL #4   Title  Pt will increase 6MWT by at least 174m(3223f in order to demonstrate clinically significant improvement in cardiopulmonary endurance and community ambulation    Baseline  06/10/20: 137589fno AD, no LOB, Rt foot slap without flat foot strike; 07/07/19: test terminated 2/2 fall; 07/09/19: 1565' with R AFO, no assistive device, mask donned; 08/27/19: 1615' with R AFO, no assistive, mask donned, 3/31/211600 ft    Time  12    Period  Weeks    Status  Partially Met    Target Date  11/19/19      PT LONG TERM GOAL #5   Title  Pt will improve FGA by at least 4 points in order to demonstrate clinically significant improvement in balance and decreased risk for falls.    Baseline  10/08/19=26/30    Period  Weeks    Status  Partially Met    Target Date  11/19/19            Plan - 10/27/19 1653    Clinical Impression Statement  Pt demonstrates excellent motivation during session today. His R ankle appears to be getting mildly stronger however he continues to demonstrate increased weakness with R ankle  dorsiflexion and eversion. He continues to demonstrate lateral veering with head turns during ball tosses with ambulation. Continued to work on leg press and heel  raises with patient as well as hip abductor strengthening. Pt heavily encouraged to perform HEP at home. Pt will benefit from PT services to address deficits in strength, balance, and mobility in order to return to full function at home.    Personal Factors and Comorbidities  Age;Comorbidity 3+;Time since onset of injury/illness/exacerbation    Comorbidities  Urothelial cancer, DM, previous lumbar disc bulge, interstitial pulmonary disease    Examination-Activity Limitations  Locomotion Level;Stairs;Transfers    Examination-Participation Restrictions  Community Activity;Laundry;Shop    Stability/Clinical Decision Making  Evolving/Moderate complexity    Rehab Potential  Fair    PT Frequency  2x / week    PT Duration  12 weeks    PT Treatment/Interventions  ADLs/Self Care Home Management;Aquatic Therapy;Biofeedback;Canalith Repostioning;Cryotherapy;Iontophoresis '4mg'$ /ml Dexamethasone;Moist Heat;Traction;Ultrasound;DME Instruction;Gait training;Stair training;Functional mobility training;Therapeutic activities;Therapeutic exercise;Balance training;Neuromuscular re-education;Patient/family education;Orthotic Fit/Training;Manual techniques;Dry needling;Energy conservation;Vestibular;Joint Manipulations    PT Next Visit Plan  Continue with strengthening and balance    PT Home Exercise Plan  Access Code: [email or web address]    Consulted and Agree with Plan of Care  Patient       Patient will benefit from skilled therapeutic intervention in order to improve the following deficits and impairments:  Abnormal gait, Decreased activity tolerance, Decreased balance, Decreased strength  Visit Diagnosis: Unsteadiness on feet  Muscle weakness (generalized)     Problem List Patient Active Problem List   Diagnosis Date Noted  . Chest pain 03/04/2019  . Anemia due to antineoplastic chemotherapy 03/03/2018  . Cough 03/03/2018  . Chemotherapy induced neutropenia ([place]) 01/22/2018  . B12 deficiency 01/01/2018  .  Hypomagnesemia 12/21/2017  . Encounter for antineoplastic chemotherapy 12/21/2017  . Malignant neoplasm of kidney ([place])   . Urothelial cancer ([place]) 12/18/2017  . Malnutrition of moderate degree 12/18/2017  . Therapeutic opioid induced constipation   . Palliative care encounter   . Dehydration 12/17/2017  . Goals of care, counseling/discussion 12/13/2017  . Urothelial carcinoma of kidney, left ([place]) 12/07/2017  . Right inguinal hernia 07/17/2015  . Family history of colon cancer 07/17/2015  . Allergic rhinitis 11/12/2014  . Airway hyperreactivity 11/12/2014  . Atherosclerosis of coronary artery 11/12/2014  . Cheilitis 11/12/2014  . Narrowing of intervertebral disc space 11/12/2014  . Deflected nasal septum 11/12/2014  . HLD (hyperlipidemia) 11/12/2014  . BP (high blood pressure) 11/12/2014  . Adult hypothyroidism 11/12/2014  . Sleep apnea 11/12/2014  . Diabetes mellitus, type 2 ([place]) 11/12/2014  . Degenerative disc disease, lumbar 09/24/2012  . Furunculosis 04/23/2012   Phillips Grout PT, DPT, GCS  Auston Halfmann 10/27/2019, 5:39 PM  [place] MAIN [facility] SERVICES [address] [place], [place], [address] Phone: [phone]   Fax:  [phone]  Name: Patrick Mcgee MRN: [id number] Date of Birth: May 05, 1946

## 2019-10-28 NOTE — Progress Notes (Signed)
Pharmacist Chemotherapy Monitoring - Follow Up Assessment    I verify that I have reviewed each item in the below checklist:  . Regimen for the patient is scheduled for the appropriate day and plan matches scheduled date. Marland Kitchen Appropriate non-routine labs are ordered dependent on drug ordered. . If applicable, additional medications reviewed and ordered per protocol based on lifetime cumulative doses and/or treatment regimen.   Plan for follow-up and/or issues identified: No . I-vent associated with next due treatment: No . MD and/or nursing notified: No  Patrick Mcgee 10/28/2019 10:47 AM

## 2019-10-29 ENCOUNTER — Other Ambulatory Visit: Payer: Self-pay

## 2019-10-29 ENCOUNTER — Ambulatory Visit: Payer: Medicare Other

## 2019-10-29 DIAGNOSIS — R2681 Unsteadiness on feet: Secondary | ICD-10-CM

## 2019-10-29 DIAGNOSIS — M6281 Muscle weakness (generalized): Secondary | ICD-10-CM

## 2019-10-29 NOTE — Therapy (Signed)
[place] MAIN [facility] SERVICES [address] [place], [place], [address] Phone: [phone]   Fax:  [phone]  Physical Therapy Treatment  Patient Details  Name: Patrick Mcgee MRN: [id number] Date of Birth: 1945-11-30 Referring Provider (PT): Dr. Rosanna Randy   Encounter Date: 10/29/2019  PT End of Session - 10/29/19 1558    Visit Number  36    Number of Visits  49    Date for PT Re-Evaluation  11/19/19    PT Start Time  8850    PT Stop Time  1640    PT Time Calculation (min)  45 min    Equipment Utilized During Treatment  Gait belt    Activity Tolerance  Patient tolerated treatment well;No increased pain    Behavior During Therapy  WFL for tasks assessed/performed       Past Medical History:  Diagnosis Date  . Acid reflux   . Anxiety   . Depression   . Diabetes mellitus without complication ([place])   . Hyperlipidemia   . Hypertension   . MRSA (methicillin resistant Staphylococcus aureus) infection 1992   history  . Myocardial infarction ([place]) 1995  . Sleep apnea    CPAP  . Urothelial cancer ([place]) 11/2017   Left Urothelial mass, chemo tx's    Past Surgical History:  Procedure Laterality Date  . CATARACT EXTRACTION Left   . COLONOSCOPY  2010   [facility]  . COLONOSCOPY WITH PROPOFOL N/A 10/04/2016   Procedure: COLONOSCOPY WITH PROPOFOL;  Surgeon: [name], MD;  Location: [facility] ENDOSCOPY;  Service: Endoscopy;  Laterality: N/A;  . [place], 2000, 2001, 2014  . CYSTOSCOPY W/ RETROGRADES Bilateral 12/10/2017   Procedure: CYSTOSCOPY WITH RETROGRADE PYELOGRAM;  Surgeon: [name], MD;  Location: [facility] ORS;  Service: Urology;  Laterality: Bilateral;  . CYSTOSCOPY W/ RETROGRADES Left 12/09/2018   Procedure: CYSTOSCOPY WITH RETROGRADE PYELOGRAM;  Surgeon: [name], MD;  Location: [facility] ORS;  Service: Urology;  Laterality: Left;  . CYSTOSCOPY WITH BIOPSY Left 12/09/2018   Procedure:  CYSTOSCOPY WITH URETERAL/RENAL PELVIC BIOPSY;  Surgeon: [name], MD;  Location: [facility] ORS;  Service: Urology;  Laterality: Left;  . CYSTOSCOPY WITH STENT PLACEMENT Left 12/10/2017   Procedure: CYSTOSCOPY WITH STENT PLACEMENT;  Surgeon: [name], MD;  Location: [facility] ORS;  Service: Urology;  Laterality: Left;  . CYSTOSCOPY WITH STENT PLACEMENT Left 12/09/2018   Procedure: CYSTOSCOPY WITH STENT PLACEMENT;  Surgeon: [name], MD;  Location: [facility] ORS;  Service: Urology;  Laterality: Left;  . CYSTOSCOPY WITH URETEROSCOPY Left 12/09/2018   Procedure: CYSTOSCOPY WITH URETEROSCOPY;  Surgeon: [name], MD;  Location: [facility] ORS;  Service: Urology;  Laterality: Left;  . EYE SURGERY Bilateral    cataract  . INGUINAL HERNIA REPAIR Right 08/09/2015   Procedure: HERNIA REPAIR INGUINAL ADULT;  Surgeon: [name], MD;  Location: [facility] ORS;  Service: General;  Laterality: Right;  . NASAL SINUS SURGERY    . nuclear stress test    . PORTA CATH INSERTION N/A 12/19/2017   Procedure: PORTA CATH INSERTION;  Surgeon: [name], MD;  Location: [place] CV LAB;  Service: Cardiovascular;  Laterality: N/A;  . URETERAL BIOPSY Left 12/10/2017   Procedure: URETERAL & renal PELVIS BIOPSY;  Surgeon: [name], MD;  Location: [facility] ORS;  Service: Urology;  Laterality: Left;  . URETEROSCOPY Left 12/10/2017   Procedure: URETEROSCOPY;  Surgeon: [name], MD;  Location: [facility] ORS;  Service: Urology;  Laterality: Left;    There were no vitals filed for this visit.  Subjective Assessment - 10/29/19 1557    Subjective  Pt reports that he is doing well today. He has not done his HEP since last therapy session. No pain reported upon arrival. No falls since last therapy session. No specific questions or concerns currently.    Pertinent History  Part of history borrowed from oncology note and verified with patient: Patient is a 74 year old male who was referred for foot drop. He started with Dr.  [name] so far for his metastatic urothelial carcinoma.  This was originally diagnosed in May 2019. He was started on chemotherapy in June 2019 and was continued on 05/27/2018 for 8 cycles.  He tolerated chemotherapy well except for chemo-induced anemia for which she has been getting Procrit every 2 weeks.  He did have response to his disease based on scans in August 2019.  However repeat scan on 05/31/2018 showed increase in the size of the primary tumor from 1.2 to 1.9 cm and increase in left para-aortic adenopathy from 0.9 to 1.3 cm.  Second line immunotherapy was recommended.  His initial biopsy specimen did not have enough sample to undergo FGFR mutation testing. He also has mild interstitial lung disease for which he sees pulmonary but he is not on home oxygen.  He has B12 deficiency for which he is on oral B12.  Also has diabetes and coronary artery disease. Patient noted to have disease progression in his lymph nodes in May 2020.  Repeat biopsy showed metastatic urothelial carcinoma which did not have a FGFR mutation.  He has been started on third line Padcev. There is still times when he feels fatigued but he is having more good days. He reports poor appetite. He was working with physical therapy on his foot drop, RLE weakness, and unsteadiness. He states that he gets considerable numbness for the 7-8 days following his chemotherapy treatment. He states that he is a diabetic and did have some numbness prior to starting chemotherapy. He states that he has a history of a low back disc bulge with RLE radicular symptoms. He used to get spinal injections but reports he hasn't had any injections in the last 10 years.    Limitations  Walking    Currently in Pain?  No/denies             TREATMENT   Ther-ex Octane xRide intervals L5/10, 45s each x 5 minutes for warm-up during history (2 minutes unbilled); Precor BLE leg press 115#x20,135# x 20, pt requires assist for first repetition of  second set; Precor BLEheel raises 85# 2 x 20; Matrix resisted gait 22.5# forward/backward/R lateral/L lateral x 3 each direction; Sit to stand from mat table with 5# overhead press x 10, 10# overhead press x 10; BOSU (round side up) forward lunges alternating leading LE x 10 on each side; BOSU (round side up) squats x 10 with hands on knees; Forward 6" step-ups alternating LE without UE support x 10 bilateral; Lateral 6" step-ups with 5# dumbbell in each hand x 10 bilateral;   Neuromuscular Re-education BOSU balance with both round side up and flat side up x 30s each; Airex NBOS dynamic tossing with dartboard with patient having to turn to reach for ball to the left and then toss x 8 minutes, CGA/minA+1 provided throughout;   Pt educated throughout session about proper posture and technique with exercises. Improved exercise technique, movement at target joints, use of target muscles after min  to mod verbal, visual, tactile cues.    Pt demonstrates excellent motivation during session today. Continued to work on leg press and heel raises with patient as well as hip abductor strengthening with resisted walking. He is demonstrating improved balance and challenged pt on the BOSU today as well as with dynamic throwing on the Airex pad. Pt heavily encouraged to perform HEP at home as he remains noncompliant. Pt will benefit from PT services to address deficits in strength, balance, and mobility in order to return to full function at home.                      PT Short Term Goals - 08/27/19 1307      PT SHORT TERM GOAL #1   Title  Pt will be independent with HEP in order to improve strength and balance in order to decrease fall risk and improve function at home    Time  6    Period  Weeks    Status  On-going    Target Date  07/14/19        PT Long Term Goals - 10/08/19 1710      PT LONG TERM GOAL #1   Title  Pt will improve BERG by at least 3 points in order to  demonstrate clinically significant improvement in balance.    Time  12    Status  Achieved      PT LONG TERM GOAL #2   Title  Pt will improve ABC by at least 13% in order to demonstrate clinically significant improvement in balance confidence.    Baseline  06/02/19: 64.4%; 07/07/19: 65%; 08/27/19: 65%,, 10/08/19=70%    Time  12    Period  Weeks    Status  On-going    Target Date  11/19/19      PT LONG TERM GOAL #3   Title  Pt will increase 30s Sit to Stand Test to greater than or equal to 14 repetitions in order to demonstrate clinically significant improvement in LE endurance and decrease in fall risk.    Baseline  06/02/19: 10 repetitions; 07/07/19: 13 repetitions; 08/27/19: 14.5 repetitions    Status  Achieved      PT LONG TERM GOAL #4   Title  Pt will increase 6MWT by at least 172m(3240f in order to demonstrate clinically significant improvement in cardiopulmonary endurance and community ambulation    Baseline  06/10/20: 137527fno AD, no LOB, Rt foot slap without flat foot strike; 07/07/19: test terminated 2/2 fall; 07/09/19: 1565' with R AFO, no assistive device, mask donned; 08/27/19: 1615' with R AFO, no assistive, mask donned, 3/31/211600 ft    Time  12    Period  Weeks    Status  Partially Met    Target Date  11/19/19      PT LONG TERM GOAL #5   Title  Pt will improve FGA by at least 4 points in order to demonstrate clinically significant improvement in balance and decreased risk for falls.    Baseline  10/08/19=26/30    Period  Weeks    Status  Partially Met    Target Date  11/19/19            Plan - 10/29/19 1559    Clinical Impression Statement  Pt demonstrates excellent motivation during session today. Continued to work on leg press and heel raises with patient as well as hip abductor strengthening with resisted walking. He is demonstrating improved balance  and challenged pt on the BOSU today as well as with dynamic throwing on the Airex pad. Pt heavily encouraged to  perform HEP at home as he remains noncompliant. Pt will benefit from PT services to address deficits in strength, balance, and mobility in order to return to full function at home.    Personal Factors and Comorbidities  Age;Comorbidity 3+;Time since onset of injury/illness/exacerbation    Comorbidities  Urothelial cancer, DM, previous lumbar disc bulge, interstitial pulmonary disease    Examination-Activity Limitations  Locomotion Level;Stairs;Transfers    Examination-Participation Restrictions  Community Activity;Laundry;Shop    Stability/Clinical Decision Making  Evolving/Moderate complexity    Rehab Potential  Fair    PT Frequency  2x / week    PT Duration  12 weeks    PT Treatment/Interventions  ADLs/Self Care Home Management;Aquatic Therapy;Biofeedback;Canalith Repostioning;Cryotherapy;Iontophoresis 44m/ml Dexamethasone;Moist Heat;Traction;Ultrasound;DME Instruction;Gait training;Stair training;Functional mobility training;Therapeutic activities;Therapeutic exercise;Balance training;Neuromuscular re-education;Patient/family education;Orthotic Fit/Training;Manual techniques;Dry needling;Energy conservation;Vestibular;Joint Manipulations    PT Next Visit Plan  Continue with strengthening and balance    PT Home Exercise Plan  Access Code: [email or web address]   Consulted and Agree with Plan of Care  Patient       Patient will benefit from skilled therapeutic intervention in order to improve the following deficits and impairments:  Abnormal gait, Decreased activity tolerance, Decreased balance, Decreased strength  Visit Diagnosis: Unsteadiness on feet  Muscle weakness (generalized)     Problem List Patient Active Problem List   Diagnosis Date Noted  . Chest pain 03/04/2019  . Anemia due to antineoplastic chemotherapy 03/03/2018  . Cough 03/03/2018  . Chemotherapy induced neutropenia ([place] 01/22/2018  . B12 deficiency 01/01/2018  . Hypomagnesemia 12/21/2017  . Encounter for antineoplastic  chemotherapy 12/21/2017  . Malignant [place] of [place] ([place]   . [place]thelial cancer ([place] 12/18/2017  . Malnutrition of moderate degree 12/18/2017  . Therapeutic opioid induced constipation   . Palliative care encounter   . Dehydration 12/17/2017  . Goals of care, counseling/discussion 12/13/2017  . Urothelial carcinoma of kidney, left ([place] 12/07/2017  . Right inguinal hernia 07/17/2015  . Family history of colon cancer 07/17/2015  . Allergic rhinitis 11/12/2014  . Airway hyperreactivity 11/12/2014  . Atherosclerosis of coronary artery 11/12/2014  . Cheilitis 11/12/2014  . Narrowing of intervertebral disc space 11/12/2014  . Deflected nasal septum 11/12/2014  . HLD (hyperlipidemia) 11/12/2014  . BP (high blood pressure) 11/12/2014  . Adult hypothyroidism 11/12/2014  . Sleep apnea 11/12/2014  . Diabetes mellitus, type 2 ([place] 11/12/2014  . Degenerative disc disease, lumbar 09/24/2012  . Furunculosis 04/23/2012   JPhillips GroutPT, DPT, GCS  , 10/29/2019, 4:54 PM  [place]MAIN [facility] [address] [address]Phone: [phone]  Fax:  [phone] Name: RGILVERTO DILEONARDOMRN: [id number] of Birth: 107-19-1947

## 2019-11-03 ENCOUNTER — Ambulatory Visit: Payer: Medicare Other

## 2019-11-03 ENCOUNTER — Other Ambulatory Visit: Payer: Self-pay

## 2019-11-03 DIAGNOSIS — R2681 Unsteadiness on feet: Secondary | ICD-10-CM

## 2019-11-03 DIAGNOSIS — M6281 Muscle weakness (generalized): Secondary | ICD-10-CM

## 2019-11-03 NOTE — Therapy (Signed)
[facility] [address] [place], [place], [address] Phone: [phone]   Fax:  [phone]  Physical Therapy Treatment  Patient Details  Name: Patrick Mcgee MRN: [id number] Date of Birth: 01/02/1946 Referring Provider (PT): Dr. Gilbert   Encounter Date: 11/03/2019  PT End of Session - 11/03/19 1524    Visit Number  37    Number of Visits  49    Date for PT Re-Evaluation  11/19/19    PT Start Time  1520    PT Stop Time  1605    PT Time Calculation (min)  45 min    Equipment Utilized During Treatment  Gait belt    Activity Tolerance  Patient tolerated treatment well;No increased pain    Behavior During Therapy  WFL for tasks assessed/performed       Past Medical History:  Diagnosis Date  . Acid reflux   . Anxiety   . Depression   . Diabetes mellitus without complication (HCC)   . Hyperlipidemia   . Hypertension   . MRSA (methicillin resistant Staphylococcus aureus) infection 1992   history  . Myocardial infarction (HCC) 1995  . Sleep apnea    CPAP  . Urothelial cancer (HCC) 11/2017   Left Urothelial mass, chemo tx's    Past Surgical History:  Procedure Laterality Date  . CATARACT EXTRACTION Left   . COLONOSCOPY  2010   [facility]  . COLONOSCOPY WITH PROPOFOL N/A 10/04/2016   Procedure: COLONOSCOPY WITH PROPOFOL;  Surgeon: Robert T Elliott, MD;  Location: [facility];  Service: Endoscopy;  Laterality: N/A;  . CORONARY ANGIOPLASTY WITH STENT PLACEMENT  1995, 1998, 2000, 2001, 2014  . CYSTOSCOPY W/ RETROGRADES Bilateral 12/10/2017   Procedure: CYSTOSCOPY WITH RETROGRADE PYELOGRAM;  Surgeon: Brandon, Ashley, MD;  Location: [facility];  Service: Urology;  Laterality: Bilateral;  . CYSTOSCOPY W/ RETROGRADES Left 12/09/2018   Procedure: CYSTOSCOPY WITH RETROGRADE PYELOGRAM;  Surgeon: Brandon, Ashley, MD;  Location: [facility];  Service: Urology;  Laterality: Left;  . CYSTOSCOPY WITH BIOPSY Left 12/09/2018   Procedure:  CYSTOSCOPY WITH URETERAL/RENAL PELVIC BIOPSY;  Surgeon: Brandon, Ashley, MD;  Location: [facility];  Service: Urology;  Laterality: Left;  . CYSTOSCOPY WITH STENT PLACEMENT Left 12/10/2017   Procedure: CYSTOSCOPY WITH STENT PLACEMENT;  Surgeon: Brandon, Ashley, MD;  Location: [facility];  Service: Urology;  Laterality: Left;  . CYSTOSCOPY WITH STENT PLACEMENT Left 12/09/2018   Procedure: CYSTOSCOPY WITH STENT PLACEMENT;  Surgeon: Brandon, Ashley, MD;  Location: [facility];  Service: Urology;  Laterality: Left;  . CYSTOSCOPY WITH URETEROSCOPY Left 12/09/2018   Procedure: CYSTOSCOPY WITH URETEROSCOPY;  Surgeon: Brandon, Ashley, MD;  Location: [facility];  Service: Urology;  Laterality: Left;  . EYE SURGERY Bilateral    cataract  . INGUINAL HERNIA REPAIR Right 08/09/2015   Procedure: HERNIA REPAIR INGUINAL ADULT;  Surgeon: Jeffrey W Byrnett, MD;  Location: [facility];  Service: General;  Laterality: Right;  . NASAL SINUS SURGERY    . nuclear stress test    . PORTA CATH INSERTION N/A 12/19/2017   Procedure: PORTA CATH INSERTION;  Surgeon: Dew,  S, MD;  Location: [facility];  Service: Cardiovascular;  Laterality: N/A;  . URETERAL BIOPSY Left 12/10/2017   Procedure: URETERAL & renal PELVIS BIOPSY;  Surgeon: Brandon, Ashley, MD;  Location: [facility];  Service: Urology;  Laterality: Left;  . URETEROSCOPY Left 12/10/2017   Procedure: URETEROSCOPY;  Surgeon: Brandon, Ashley, MD;  Location: [facility];  Service: Urology;    Laterality: Left;    There were no vitals filed for this visit.  Subjective Assessment - 11/03/19 1523    Subjective  Pt reports that he is doing well today. He has been performing his HEP. No pain reported upon arrival. No falls since last therapy session. No specific questions or concerns currently.    Pertinent History  Part of history borrowed from oncology note and verified with patient: Patient is a 74-year-old male who was referred for foot drop. He started with Dr. Corcoran so far for  his metastatic urothelial carcinoma.  This was originally diagnosed in May 2019. He was started on chemotherapy in June 2019 and was continued on 05/27/2018 for 8 cycles.  He tolerated chemotherapy well except for chemo-induced anemia for which she has been getting Procrit every 2 weeks.  He did have response to his disease based on scans in August 2019.  However repeat scan on 05/31/2018 showed increase in the size of the primary tumor from 1.2 to 1.9 cm and increase in left para-aortic adenopathy from 0.9 to 1.3 cm.  Second line immunotherapy was recommended.  His initial biopsy specimen did not have enough sample to undergo FGFR mutation testing. He also has mild interstitial lung disease for which he sees pulmonary but he is not on home oxygen.  He has B12 deficiency for which he is on oral B12.  Also has diabetes and coronary artery disease. Patient noted to have disease progression in his lymph nodes in May 2020.  Repeat biopsy showed metastatic urothelial carcinoma which did not have a FGFR mutation.  He has been started on third line Padcev. There is still times when he feels fatigued but he is having more good days. He reports poor appetite. He was working with physical therapy on his foot drop, RLE weakness, and unsteadiness. He states that he gets considerable numbness for the 7-8 days following his chemotherapy treatment. He states that he is a diabetic and did have some numbness prior to starting chemotherapy. He states that he has a history of a low back disc bulge with RLE radicular symptoms. He used to get spinal injections but reports he hasn't had any injections in the last 10 years.    Limitations  Walking    Currently in Pain?  No/denies           TREATMENT   Ther-ex NuStep L2-4 x 5 minutes for warm-up during history (3 minutes unbilled); Precor BLE leg press 120#x20,130# x20, pt requires assist for first repetition of second set, attempted 135# today but pt unable to  perform; Precor BLEheel raises 85#2 x 20; Sit to standfrom regular height chair with 10# overhead press 2 x 10; Sit to stand 10# med ball toss with therapist x 10;    Neuromuscular Re-education Airex balance with front foot on green dynadisc alternating foot position 30s x 2; Airex NBOS dynamic tossing with dartboard with patient having to turn to reach for ball to the left and then toss x 5 minutes, CGA/minA+1 provided throughout; Four square stepping CW/CCW x multiple laps each, relatively easy for patient; Star stepping x multiple bouts on each side as well as crossing midline, crossing midline challenging for patient's balance.    Pt educated throughout session about proper posture and technique with exercises. Improved exercise technique, movement at target joints, use of target muscles after min to mod verbal, visual, tactile cues.   Pt demonstrates excellent motivation during session today. Continued to work on leg press and heel   raises with patient as well as explosive sit to stand. He is demonstrating improved balance with dynamic throwing on the Airex pad. Pt heavily encouraged to perform HEP at home. He was compliant over the weekend.Pt will benefit from PT services to address deficits in strength, balance, and mobility in order to return to full function at home.                      PT Short Term Goals - 08/27/19 1307      PT SHORT TERM GOAL #1   Title  Pt will be independent with HEP in order to improve strength and balance in order to decrease fall risk and improve function at home    Time  6    Period  Weeks    Status  On-going    Target Date  07/14/19        PT Long Term Goals - 10/08/19 1710      PT LONG TERM GOAL #1   Title  Pt will improve BERG by at least 3 points in order to demonstrate clinically significant improvement in balance.    Time  12    Status  Achieved      PT LONG TERM GOAL #2   Title  Pt will improve ABC by at  least 13% in order to demonstrate clinically significant improvement in balance confidence.    Baseline  06/02/19: 64.4%; 07/07/19: 65%; 08/27/19: 65%,, 10/08/19=70%    Time  12    Period  Weeks    Status  On-going    Target Date  11/19/19      PT LONG TERM GOAL #3   Title  Pt will increase 30s Sit to Stand Test to greater than or equal to 14 repetitions in order to demonstrate clinically significant improvement in LE endurance and decrease in fall risk.    Baseline  06/02/19: 10 repetitions; 07/07/19: 13 repetitions; 08/27/19: 14.5 repetitions    Status  Achieved      PT LONG TERM GOAL #4   Title  Pt will increase 6MWT by at least 18m(3222f in order to demonstrate clinically significant improvement in cardiopulmonary endurance and community ambulation    Baseline  06/10/20: 137515fno AD, no LOB, Rt foot slap without flat foot strike; 07/07/19: test terminated 2/2 fall; 07/09/19: 1565' with R AFO, no assistive device, mask donned; 08/27/19: 1615' with R AFO, no assistive, mask donned, 3/31/211600 ft    Time  12    Period  Weeks    Status  Partially Met    Target Date  11/19/19      PT LONG TERM GOAL #5   Title  Pt will improve FGA by at least 4 points in order to demonstrate clinically significant improvement in balance and decreased risk for falls.    Baseline  10/08/19=26/30    Period  Weeks    Status  Partially Met    Target Date  11/19/19            Plan - 11/03/19 1524    Clinical Impression Statement  Pt demonstrates excellent motivation during session today. Continued to work on leg press and heel raises with patient as well as explosive sit to stand. He is demonstrating improved balance with dynamic throwing on the Airex pad. Pt heavily encouraged to perform HEP at home. He was compliant over the weekend. Pt will benefit from PT services to address deficits in strength, balance, and mobility in order  to return to full function at home.    Personal Factors and Comorbidities   Age;Comorbidity 3+;Time since onset of injury/illness/exacerbation    Comorbidities  Urothelial cancer, DM, previous lumbar disc bulge, interstitial pulmonary disease    Examination-Activity Limitations  Locomotion Level;Stairs;Transfers    Examination-Participation Restrictions  Community Activity;Laundry;Shop    Stability/Clinical Decision Making  Evolving/Moderate complexity    Rehab Potential  Fair    PT Frequency  2x / week    PT Duration  12 weeks    PT Treatment/Interventions  ADLs/Self Care Home Management;Aquatic Therapy;Biofeedback;Canalith Repostioning;Cryotherapy;Iontophoresis 4mg/ml Dexamethasone;Moist Heat;Traction;Ultrasound;DME Instruction;Gait training;Stair training;Functional mobility training;Therapeutic activities;Therapeutic exercise;Balance training;Neuromuscular re-education;Patient/family education;Orthotic Fit/Training;Manual techniques;Dry needling;Energy conservation;Vestibular;Joint Manipulations    PT Next Visit Plan  Continue with strengthening and balance    PT Home Exercise Plan  Access Code: [email or web address]    Consulted and Agree with Plan of Care  Patient       Patient will benefit from skilled therapeutic intervention in order to improve the following deficits and impairments:  Abnormal gait, Decreased activity tolerance, Decreased balance, Decreased strength  Visit Diagnosis: Unsteadiness on feet  Muscle weakness (generalized)     Problem List Patient Active Problem List   Diagnosis Date Noted  . Chest pain 03/04/2019  . Anemia due to antineoplastic chemotherapy 03/03/2018  . Cough 03/03/2018  . Chemotherapy induced neutropenia (HCC) 01/22/2018  . B12 deficiency 01/01/2018  . Hypomagnesemia 12/21/2017  . Encounter for antineoplastic chemotherapy 12/21/2017  . Malignant neoplasm of kidney (HCC)   . Urothelial cancer (HCC) 12/18/2017  . Malnutrition of moderate degree 12/18/2017  . Therapeutic opioid induced constipation   . Palliative care encounter    . Dehydration 12/17/2017  . Goals of care, counseling/discussion 12/13/2017  . Urothelial carcinoma of kidney, left (HCC) 12/07/2017  . Right inguinal hernia 07/17/2015  . Family history of colon cancer 07/17/2015  . Allergic rhinitis 11/12/2014  . Airway hyperreactivity 11/12/2014  . Atherosclerosis of coronary artery 11/12/2014  . Cheilitis 11/12/2014  . Narrowing of intervertebral disc space 11/12/2014  . Deflected nasal septum 11/12/2014  . HLD (hyperlipidemia) 11/12/2014  . BP (high blood pressure) 11/12/2014  . Adult hypothyroidism 11/12/2014  . Sleep apnea 11/12/2014  . Diabetes mellitus, type 2 (HCC) 11/12/2014  . Degenerative disc disease, lumbar 09/24/2012  . Furunculosis 04/23/2012    D  PT, DPT, GCS  , 11/03/2019, 5:17 PM  [facility] [address] [place], [place], [address] Phone: [phone]   Fax:  [phone]  Name: Patrick Mcgee MRN: [id number] Date of Birth: 01/17/1946   

## 2019-11-04 ENCOUNTER — Inpatient Hospital Stay: Payer: Medicare Other

## 2019-11-04 ENCOUNTER — Inpatient Hospital Stay (HOSPITAL_BASED_OUTPATIENT_CLINIC_OR_DEPARTMENT_OTHER): Payer: Medicare Other | Admitting: Oncology

## 2019-11-04 ENCOUNTER — Other Ambulatory Visit: Payer: Self-pay

## 2019-11-04 ENCOUNTER — Inpatient Hospital Stay: Payer: Medicare Other | Admitting: Hospice and Palliative Medicine

## 2019-11-04 ENCOUNTER — Encounter: Payer: Self-pay | Admitting: Oncology

## 2019-11-04 VITALS — BP 103/80 | HR 74 | Temp 97.2°F | Resp 16 | Wt 160.1 lb

## 2019-11-04 DIAGNOSIS — C642 Malignant neoplasm of left kidney, except renal pelvis: Secondary | ICD-10-CM | POA: Diagnosis not present

## 2019-11-04 DIAGNOSIS — R5382 Chronic fatigue, unspecified: Secondary | ICD-10-CM | POA: Diagnosis not present

## 2019-11-04 DIAGNOSIS — G620 Drug-induced polyneuropathy: Secondary | ICD-10-CM | POA: Diagnosis not present

## 2019-11-04 DIAGNOSIS — Z7189 Other specified counseling: Secondary | ICD-10-CM | POA: Diagnosis not present

## 2019-11-04 DIAGNOSIS — I25119 Atherosclerotic heart disease of native coronary artery with unspecified angina pectoris: Secondary | ICD-10-CM | POA: Diagnosis not present

## 2019-11-04 DIAGNOSIS — T451X5A Adverse effect of antineoplastic and immunosuppressive drugs, initial encounter: Secondary | ICD-10-CM

## 2019-11-04 DIAGNOSIS — E538 Deficiency of other specified B group vitamins: Secondary | ICD-10-CM | POA: Diagnosis not present

## 2019-11-04 LAB — COMPREHENSIVE METABOLIC PANEL
ALT: 15 U/L (ref 0–44)
AST: 20 U/L (ref 15–41)
Albumin: 3.9 g/dL (ref 3.5–5.0)
Alkaline Phosphatase: 79 U/L (ref 38–126)
Anion gap: 10 (ref 5–15)
BUN: 21 mg/dL (ref 8–23)
CO2: 25 mmol/L (ref 22–32)
Calcium: 9.7 mg/dL (ref 8.9–10.3)
Chloride: 101 mmol/L (ref 98–111)
Creatinine, Ser: 0.79 mg/dL (ref 0.61–1.24)
GFR calc Af Amer: >60 mL/min (ref >60)
GFR calc non Af Amer: >60 mL/min (ref >60)
Glucose, Bld: 262 mg/dL — ABNORMAL HIGH (ref 70–99)
Potassium: 4.4 mmol/L (ref 3.5–5.1)
Sodium: 136 mmol/L (ref 135–145)
Total Bilirubin: 1.3 mg/dL — ABNORMAL HIGH (ref 0.3–1.2)
Total Protein: 7 g/dL (ref 6.5–8.1)

## 2019-11-04 LAB — CBC WITH DIFFERENTIAL/PLATELET
Abs Immature Granulocytes: 0.01 10*3/uL (ref 0.00–0.07)
Basophils Absolute: 0.1 10*3/uL (ref 0.0–0.1)
Basophils Relative: 1 %
Eosinophils Absolute: 0.4 10*3/uL (ref 0.0–0.5)
Eosinophils Relative: 7 %
HCT: 37.7 % — ABNORMAL LOW (ref 39.0–52.0)
Hemoglobin: 12.6 g/dL — ABNORMAL LOW (ref 13.0–17.0)
Immature Granulocytes: 0 %
Lymphocytes Relative: 20 %
Lymphs Abs: 1.2 10*3/uL (ref 0.7–4.0)
MCH: 29.2 pg (ref 26.0–34.0)
MCHC: 33.4 g/dL (ref 30.0–36.0)
MCV: 87.5 fL (ref 80.0–100.0)
Monocytes Absolute: 0.5 10*3/uL (ref 0.1–1.0)
Monocytes Relative: 9 %
Neutro Abs: 3.9 10*3/uL (ref 1.7–7.7)
Neutrophils Relative %: 63 %
Platelets: 177 10*3/uL (ref 150–400)
RBC: 4.31 MIL/uL (ref 4.22–5.81)
RDW: 15.5 % (ref 11.5–15.5)
WBC: 6.1 10*3/uL (ref 4.0–10.5)
nRBC: 0 % (ref 0.0–0.2)

## 2019-11-04 NOTE — Progress Notes (Signed)
Patient here for oncology follow-up appointment, expresses concerns of numbness in feet which is not new for patient. Ask for magic mouthwash RX refill.

## 2019-11-05 ENCOUNTER — Ambulatory Visit: Payer: Medicare Other

## 2019-11-05 DIAGNOSIS — M6281 Muscle weakness (generalized): Secondary | ICD-10-CM

## 2019-11-05 DIAGNOSIS — R2681 Unsteadiness on feet: Secondary | ICD-10-CM

## 2019-11-05 NOTE — Therapy (Addendum)
[place] MAIN [facility] SERVICES [address] [place], [place], [address] Phone: [phone]   Fax:  [phone]  Physical Therapy Treatment  Patient Details  Name: Patrick Mcgee MRN: [id number] Date of Birth: March 02, 1946 Referring Provider (PT): Dr. Rosanna Randy   Encounter Date: 11/05/2019  PT End of Session - 11/05/19 1559    Visit Number  38    Number of Visits  49    Date for PT Re-Evaluation  11/19/19    PT Start Time  1600    PT Stop Time  1645    PT Time Calculation (min)  45 min    Equipment Utilized During Treatment  Gait belt    Activity Tolerance  Patient tolerated treatment well;No increased pain    Behavior During Therapy  WFL for tasks assessed/performed       Past Medical History:  Diagnosis Date  . Acid reflux   . Anxiety   . Depression   . Diabetes mellitus without complication ([place])   . Hyperlipidemia   . Hypertension   . MRSA (methicillin resistant Staphylococcus aureus) infection 1992   history  . Myocardial infarction ([place]) 1995  . Sleep apnea    CPAP  . Urothelial cancer ([place]) 11/2017   Left Urothelial mass, chemo tx's    Past Surgical History:  Procedure Laterality Date  . CATARACT EXTRACTION Left   . COLONOSCOPY  2010   [facility]  . COLONOSCOPY WITH PROPOFOL N/A 10/04/2016   Procedure: COLONOSCOPY WITH PROPOFOL;  Mcgee: [name], MD;  Location: [facility] ENDOSCOPY;  Service: Endoscopy;  Laterality: N/A;  . [place], 2000, 2001, 2014  . CYSTOSCOPY W/ RETROGRADES Bilateral 12/10/2017   Procedure: CYSTOSCOPY WITH RETROGRADE PYELOGRAM;  Mcgee: [name], MD;  Location: [facility] ORS;  Service: Urology;  Laterality: Bilateral;  . CYSTOSCOPY W/ RETROGRADES Left 12/09/2018   Procedure: CYSTOSCOPY WITH RETROGRADE PYELOGRAM;  Mcgee: [name], MD;  Location: [facility] ORS;  Service: Urology;  Laterality: Left;  . CYSTOSCOPY WITH BIOPSY Left 12/09/2018   Procedure:  CYSTOSCOPY WITH URETERAL/RENAL PELVIC BIOPSY;  Mcgee: [name], MD;  Location: [facility] ORS;  Service: Urology;  Laterality: Left;  . CYSTOSCOPY WITH STENT PLACEMENT Left 12/10/2017   Procedure: CYSTOSCOPY WITH STENT PLACEMENT;  Mcgee: [name], MD;  Location: [facility] ORS;  Service: Urology;  Laterality: Left;  . CYSTOSCOPY WITH STENT PLACEMENT Left 12/09/2018   Procedure: CYSTOSCOPY WITH STENT PLACEMENT;  Mcgee: [name], MD;  Location: [facility] ORS;  Service: Urology;  Laterality: Left;  . CYSTOSCOPY WITH URETEROSCOPY Left 12/09/2018   Procedure: CYSTOSCOPY WITH URETEROSCOPY;  Mcgee: [name], MD;  Location: [facility] ORS;  Service: Urology;  Laterality: Left;  . EYE SURGERY Bilateral    cataract  . INGUINAL HERNIA REPAIR Right 08/09/2015   Procedure: HERNIA REPAIR INGUINAL ADULT;  Mcgee: [name], MD;  Location: [facility] ORS;  Service: General;  Laterality: Right;  . NASAL SINUS SURGERY    . nuclear stress test    . PORTA CATH INSERTION N/A 12/19/2017   Procedure: PORTA CATH INSERTION;  Mcgee: [name], MD;  Location: [place] CV LAB;  Service: Cardiovascular;  Laterality: N/A;  . URETERAL BIOPSY Left 12/10/2017   Procedure: URETERAL & renal PELVIS BIOPSY;  Mcgee: [name], MD;  Location: [facility] ORS;  Service: Urology;  Laterality: Left;  . URETEROSCOPY Left 12/10/2017   Procedure: URETEROSCOPY;  Mcgee: [name], MD;  Location: [facility] ORS;  Service: Urology;  Laterality: Left;    There were no vitals filed for this visit.  Subjective Assessment - 11/05/19 1559    Subjective  Pt reports that he is doing well today. He didn't do his HEP yesterday. No pain reported upon arrival. No falls since last therapy session. He saw his oncologist yesterday and based on his most recent scans he has no sign of cancer. No specific questions or concerns currently.    Pertinent History  Part of history borrowed from oncology note and verified with patient: Patient is  a 74 year old male who was referred for foot drop. He started with Dr. [name] so far for his metastatic urothelial carcinoma.  This was originally diagnosed in May 2019. He was started on chemotherapy in June 2019 and was continued on 05/27/2018 for 8 cycles.  He tolerated chemotherapy well except for chemo-induced anemia for which she has been getting Procrit every 2 weeks.  He did have response to his disease based on scans in August 2019.  However repeat scan on 05/31/2018 showed increase in the size of the primary tumor from 1.2 to 1.9 cm and increase in left para-aortic adenopathy from 0.9 to 1.3 cm.  Second line immunotherapy was recommended.  His initial biopsy specimen did not have enough sample to undergo FGFR mutation testing. He also has mild interstitial lung disease for which he sees pulmonary but he is not on home oxygen.  He has B12 deficiency for which he is on oral B12.  Also has diabetes and coronary artery disease. Patient noted to have disease progression in his lymph nodes in May 2020.  Repeat biopsy showed metastatic urothelial carcinoma which did not have a FGFR mutation.  He has been started on third line Padcev. There is still times when he feels fatigued but he is having more good days. He reports poor appetite. He was working with physical therapy on his foot drop, RLE weakness, and unsteadiness. He states that he gets considerable numbness for the 7-8 days following his chemotherapy treatment. He states that he is a diabetic and did have some numbness prior to starting chemotherapy. He states that he has a history of a low back disc bulge with RLE radicular symptoms. He used to get spinal injections but reports he hasn't had any injections in the last 10 years.    Limitations  Walking    Currently in Pain?  No/denies         TREATMENT   Ther-ex NuStep L2-4 x 5 minutes for warm-up during history (3 minutes unbilled); Precor BLE leg press 130# 2 x20, pt requires assist  for first repetition of each set; Precor BLEheel raises 85#2 x 20; HOIST knee extension (back position 5, leg 4) plate 2 (93#) x 20, plate 3 (71#) x 20; HOIST HS curls (back position 5, leg 14) plate 4 (69#) x 20, plate 5 (67#) x 20; HOIST chest press (seat position 6, arm position 1, vertical handles) plate 1 (8#) 2 x 10; HOIST tricep extension plate 5 (89#), 2 x 10; Sit to standfrom regular height chair with 10# overhead press 2 x 10;   Neuromuscular Re-education Airex balance with front foot on green dynadisc alternating foot position 30s x 2; Airex NBOS dynamic tossing with dartboard with patient having to turn to reach for ball to the left and then toss x 5 minutes, CGA/minA+1 provided throughout; Four square stepping CW/CCW x multiple laps each, relatively easy for patient; 6" alternating toe taps without UE support x 10 on  each side;   Pt educated throughout session about proper posture and technique with exercises. Improved exercise technique, movement at target joints, use of target muscles after min to mod verbal, visual, tactile cues.   Pt demonstrates excellent motivation during session today. Continued to work on leg press and heel raises with patient and he is able to complete 2 sets at 130 pounds. He is demonstrating improved balance with dynamic throwing on the Airex pad. Pt heavily encouraged to perform HEP at home.Pt will benefit from PT services to address deficits in strength, balance, and mobility in order to return to full function at home.                        PT Short Term Goals - 08/27/19 1307      PT SHORT TERM GOAL #1   Title  Pt will be independent with HEP in order to improve strength and balance in order to decrease fall risk and improve function at home    Time  6    Period  Weeks    Status  On-going    Target Date  07/14/19        PT Long Term Goals - 10/08/19 1710      PT LONG TERM GOAL #1   Title  Pt will  improve BERG by at least 3 points in order to demonstrate clinically significant improvement in balance.    Time  12    Status  Achieved      PT LONG TERM GOAL #2   Title  Pt will improve ABC by at least 13% in order to demonstrate clinically significant improvement in balance confidence.    Baseline  06/02/19: 64.4%; 07/07/19: 65%; 08/27/19: 65%,, 10/08/19=70%    Time  12    Period  Weeks    Status  On-going    Target Date  11/19/19      PT LONG TERM GOAL #3   Title  Pt will increase 30s Sit to Stand Test to greater than or equal to 14 repetitions in order to demonstrate clinically significant improvement in LE endurance and decrease in fall risk.    Baseline  06/02/19: 10 repetitions; 07/07/19: 13 repetitions; 08/27/19: 14.5 repetitions    Status  Achieved      PT LONG TERM GOAL #4   Title  Pt will increase 6MWT by at least 154m(3262f in order to demonstrate clinically significant improvement in cardiopulmonary endurance and community ambulation    Baseline  06/10/20: 13751fno AD, no LOB, Rt foot slap without flat foot strike; 07/07/19: test terminated 2/2 fall; 07/09/19: 1565' with R AFO, no assistive device, mask donned; 08/27/19: 1615' with R AFO, no assistive, mask donned, 3/31/211600 ft    Time  12    Period  Weeks    Status  Partially Met    Target Date  11/19/19      PT LONG TERM GOAL #5   Title  Pt will improve FGA by at least 4 points in order to demonstrate clinically significant improvement in balance and decreased risk for falls.    Baseline  10/08/19=26/30    Period  Weeks    Status  Partially Met    Target Date  11/19/19            Plan - 11/05/19 1559    Clinical Impression Statement  Pt demonstrates excellent motivation during session today. Continued to work on leg press and heel raises  with patient and he is able to complete 2 sets at 130 pounds. He is demonstrating improved balance with dynamic throwing on the Airex pad. Pt heavily encouraged to perform HEP  at home. Pt will benefit from PT services to address deficits in strength, balance, and mobility in order to return to full function at home.    Personal Factors and Comorbidities  Age;Comorbidity 3+;Time since onset of injury/illness/exacerbation    Comorbidities  Urothelial cancer, DM, previous lumbar disc bulge, interstitial pulmonary disease    Examination-Activity Limitations  Locomotion Level;Stairs;Transfers    Examination-Participation Restrictions  Community Activity;Laundry;Shop    Stability/Clinical Decision Making  Evolving/Moderate complexity    Rehab Potential  Fair    PT Frequency  2x / week    PT Duration  12 weeks    PT Treatment/Interventions  ADLs/Self Care Home Management;Aquatic Therapy;Biofeedback;Canalith Repostioning;Cryotherapy;Iontophoresis '4mg'$ /ml Dexamethasone;Moist Heat;Traction;Ultrasound;DME Instruction;Gait training;Stair training;Functional mobility training;Therapeutic activities;Therapeutic exercise;Balance training;Neuromuscular re-education;Patient/family education;Orthotic Fit/Training;Manual techniques;Dry needling;Energy conservation;Vestibular;Joint Manipulations    PT Next Visit Plan  Continue with strengthening and balance    PT Home Exercise Plan  Access Code: [email or web address]    Consulted and Agree with Plan of Care  Patient       Patient will benefit from skilled therapeutic intervention in order to improve the following deficits and impairments:  Abnormal gait, Decreased activity tolerance, Decreased balance, Decreased strength  Visit Diagnosis: Unsteadiness on feet  Muscle weakness (generalized)     Problem List Patient Active Problem List   Diagnosis Date Noted  . Chest pain 03/04/2019  . Anemia due to antineoplastic chemotherapy 03/03/2018  . Cough 03/03/2018  . Chemotherapy induced neutropenia ([place]) 01/22/2018  . B12 deficiency 01/01/2018  . Hypomagnesemia 12/21/2017  . Encounter for antineoplastic chemotherapy 12/21/2017  . Malignant  neoplasm of kidney ([place])   . Urothelial cancer ([place]) 12/18/2017  . Malnutrition of moderate degree 12/18/2017  . Therapeutic opioid induced constipation   . Palliative care encounter   . Dehydration 12/17/2017  . Goals of care, counseling/discussion 12/13/2017  . Urothelial carcinoma of kidney, left ([place]) 12/07/2017  . Right inguinal hernia 07/17/2015  . Family history of colon cancer 07/17/2015  . Allergic rhinitis 11/12/2014  . Airway hyperreactivity 11/12/2014  . Atherosclerosis of coronary artery 11/12/2014  . Cheilitis 11/12/2014  . Narrowing of intervertebral disc space 11/12/2014  . Deflected nasal septum 11/12/2014  . HLD (hyperlipidemia) 11/12/2014  . BP (high blood pressure) 11/12/2014  . Adult hypothyroidism 11/12/2014  . Sleep apnea 11/12/2014  . Diabetes mellitus, type 2 ([place]) 11/12/2014  . Degenerative disc disease, lumbar 09/24/2012  . Furunculosis 04/23/2012   Phillips Grout PT, DPT, GCS  Lajuana Patchell 11/06/2019, 12:41 PM  [place] MAIN [facility] SERVICES [address] [place], [place], [address] Phone: [phone]   Fax:  [phone]  Name: Patrick Mcgee MRN: [id number] Date of Birth: 01-09-1946

## 2019-11-07 NOTE — Progress Notes (Signed)
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 [name] so far for his metastatic urothelial carcinoma. This was originally diagnosed in May 2019. He was noted to have a filling defect in the lower pole collecting system of the left kidney along with para-aortic and retroperitoneal adenopathy concerning for metastatic disease. He underwent left ureteroscopy and renal pelvis biopsy which revealed small fragments of high-grade urothelial carcinoma with small focus of invasion. He was started on carboplatin and gemcitabine chemotherapy in June 2019 and was continued on 05/27/2018 for 8 cycles. He tolerated chemotherapy well except for chemo-induced anemia for which she has been getting Procrit every 2 weeks. He did have response to his disease based on scans in August 2019. However repeat scan on 05/31/2018 showed increase in the size of the primary tumor from 1.2 to 1.9 cm and increase in left para-aortic adenopathy from 0.9 to 1.3 cm. Periportal adenopathy was stable at 1.4 cm. Second line immunotherapy was recommended. His initial biopsy specimen did not have enough sample to undergo FGFR mutation testing. He also has mild interstitial lung disease for which he sees  pulmonary but he is not on home oxygen. He has B12 deficiency for which he is on oral B12. Also has diabetes and coronary artery disease.Tecentriq started on 06/17/2018.Patient noted to have disease progression in his lymph nodes in May 2020. Repeat biopsy showed metastatic urothelial carcinoma which did not have aFGFR mutation. He has been started on third line Padcev   Interval history-patient overall enjoys a good quality of life after going on Padcev week on 3 weeks off.  He still has symptoms of tingling numbness in his hands and feet and chronic fatigue.  Denies other complaints  ECOG PS- 1 Pain scale- 0   Review of systems- Review of Systems  Constitutional: Positive for malaise/fatigue. Negative for chills, fever and weight loss.  HENT: Negative for congestion, ear discharge and nosebleeds.   Eyes: Negative for blurred vision.  Respiratory: Negative for cough, hemoptysis, sputum production, shortness of breath and wheezing.   Cardiovascular: Negative for chest pain, palpitations, orthopnea and claudication.  Gastrointestinal: Negative for abdominal pain, blood in stool, constipation, diarrhea, heartburn, melena, nausea and vomiting.  Genitourinary: Negative for dysuria, flank pain, frequency, hematuria and urgency.  Musculoskeletal: Negative for back pain, joint pain and myalgias.  Skin: Negative for rash.  Neurological: Positive for sensory change (Peripheral neuropathy). Negative for dizziness, tingling, focal weakness, seizures, weakness and headaches.  Endo/Heme/Allergies: Does not bruise/bleed easily.  Psychiatric/Behavioral: Negative for depression and suicidal ideas. The patient does not have insomnia.       Allergies  Allergen Reactions  . Sulfa Antibiotics Other (See Comments)    Joint pain  . Ace Inhibitors Cough  . Invokana [Canagliflozin] Other (See Comments)    Leg pain  . Penicillins Rash    Did it involve swelling of the face/tongue/throat, SOB,  or low  BP? No Did it involve sudden or severe rash/hives, skin peeling, or any reaction on the inside of your mouth or nose? Yes Did you need to seek medical attention at a hospital or doctor's office? Yes When did it last happen?childhood allergy If all above answers are "NO", may proceed with cephalosporin use.      Past Medical History:  Diagnosis Date  . Acid reflux   . Anxiety   . Depression   . Diabetes mellitus without complication ([place])   . Hyperlipidemia   . Hypertension   . MRSA (methicillin resistant Staphylococcus aureus) infection 1992   history  . Myocardial infarction ([place]) 1995  . Sleep apnea    CPAP  . Urothelial cancer ([place]) 11/2017   Left Urothelial mass, chemo tx's     Past Surgical History:  Procedure Laterality Date  . CATARACT EXTRACTION Left   . COLONOSCOPY  2010   [facility]  . COLONOSCOPY WITH PROPOFOL N/A 10/04/2016   Procedure: COLONOSCOPY WITH PROPOFOL;  Surgeon: [name], MD;  Location[facility]COPY;  Service: Endoscopy;  Laterality: N/A;  . [place], 2000, 2001, 2014  . CYSTOSCOPY W/ RETROGRADES Bilateral 12/10/2017   Procedure: CYSTOSCOPY WITH RETROGRADE PYELOGRAM;  Surgeon: [name], MD;  Location: [facility] ORS;  Service: Urology;  Laterality: Bilateral;  . CYSTOSCOPY W/ RETROGRADES Left 12/09/2018   Procedure: CYSTOSCOPY WITH RETROGRADE PYELOGRAM;  Surgeon: [name], MD;  Location: [facility] ORS;  Service: Urology;  Laterality: Left;  . CYSTOSCOPY WITH BIOPSY Left 12/09/2018   Procedure: CYSTOSCOPY WITH URETERAL/RENAL PELVIC BIOPSY;  Surgeon: [name], MD;  Location: [facility] ORS;  Service: Urology;  Laterality: Left;  . CYSTOSCOPY WITH STENT PLACEMENT Left 12/10/2017   Procedure: CYSTOSCOPY WITH STENT PLACEMENT;  Surgeon: [name], MD;  Location: [facility] ORS;  Service: Urology;  Laterality: Left;  . CYSTOSCOPY WITH STENT PLACEMENT Left 12/09/2018   Procedure: CYSTOSCOPY WITH STENT  PLACEMENT;  Surgeon: [name], MD;  Location: [facility] ORS;  Service: Urology;  Laterality: Left;  . CYSTOSCOPY WITH URETEROSCOPY Left 12/09/2018   Procedure: CYSTOSCOPY WITH URETEROSCOPY;  Surgeon: [name], MD;  Location: [facility] ORS;  Service: Urology;  Laterality: Left;  . EYE SURGERY Bilateral    cataract  . INGUINAL HERNIA REPAIR Right 08/09/2015   Procedure: HERNIA REPAIR INGUINAL ADULT;  Surgeon: [name], MD;  Location: [facility] ORS;  Service: General;  Laterality: Right;  . NASAL SINUS SURGERY    . nuclear stress test    . PORTA CATH INSERTION N/A 12/19/2017   Procedure: PORTA CATH INSERTION;  Surgeon: [name], MD;  Location: [facility] CV LAB;  Service: Cardiovascular;  Laterality: N/A;  . URETERAL BIOPSY Left 12/10/2017   Procedure: URETERAL & renal PELVIS BIOPSY;  Surgeon: [name], MD;  Location: [facility] ORS;  Service: Urology;  Laterality: Left;  . URETEROSCOPY Left 12/10/2017   Procedure: URETEROSCOPY;  Surgeon: [name], MD;  Location: [facility] ORS;  Service: Urology;  Laterality: Left;    Social History   Socioeconomic History  . Marital status: Married    Spouse name: [name]  . Number of children: 1  . Years of education: Not on file  . Highest education level: Associate degree: occupational, Hotel manager, or vocational program  Occupational History  . Occupation: retired  Tobacco Use  . Smoking status: Former Smoker    Types: Cigars    Quit date: 10/09/1978    Years since quitting: 41.1  . Smokeless  tobacco: Former Systems developer    Types: Chew    Quit date: 12/04/1988  . Tobacco comment: on occasion  Substance and Sexual Activity  . Alcohol use: Not Currently  . Drug use: No  . Sexual activity: Not Currently  Other Topics Concern  . Not on file  Social History Narrative  . Not on file   Social Determinants of Health   Financial Resource Strain:   . Difficulty of Paying Living Expenses:   Food Insecurity:   . Worried About Charity fundraiser in  the Last Year:   . Arboriculturist in the Last Year:   Transportation Needs:   . Film/video editor (Medical):   Marland Kitchen Lack of Transportation (Non-Medical):   Physical Activity: Inactive  . Days of Exercise per Week: 0 days  . Minutes of Exercise per Session: 0 min  Stress: No Stress Concern Present  . Feeling of Stress : Not at all  Social Connections: Unknown  . Frequency of Communication with Friends and Family: Patient refused  . Frequency of Social Gatherings with Friends and Family: Patient refused  . Attends Religious Services: Patient refused  . Active Member of Clubs or Organizations: Patient refused  . Attends Archivist Meetings: Patient refused  . Marital Status: Patient refused  Intimate Partner Violence: Unknown  . Fear of Current or Ex-Partner: Patient refused  . Emotionally Abused: Patient refused  . Physically Abused: Patient refused  . Sexually Abused: Patient refused    Family History  Problem Relation Age of Onset  . Heart disease Mother   . Cancer Father        Lung and colon cancer  . Heart disease Father   . Emphysema Maternal Grandfather   . Tuberculosis Maternal Grandmother      Current Outpatient Medications:  .  aspirin EC 81 MG tablet, Take 81 mg by mouth every evening. , Disp: , Rfl:  .  atorvastatin (LIPITOR) 40 MG tablet, TAKE 1 TABLET BY MOUTH AT BEDTIME, Disp: 90 tablet, Rfl: 4 .  Cholecalciferol (VITAMIN D3) 75 MCG (3000 UT) TABS, Take by mouth., Disp: , Rfl:  .  clobetasol cream (TEMOVATE) [id number] %, Apply 1 application topically as needed (for skin rash)., Disp: 30 g, Rfl: 1 .  gabapentin (NEURONTIN) 300 MG capsule, TAKE 1 CAPSULE BY MOUTH THREE TIMES DAILY, Disp: 270 capsule, Rfl: 0 .  glucose blood (CONTOUR NEXT TEST) test strip, Check blood sugars 3 times daily., Disp: 300 each, Rfl: 11 .  Ivermectin (SOOLANTRA) 1 % CREA, Apply 1 application topically daily as needed (rosacea). To face, Disp: , Rfl:  .  JARDIANCE 10 MG TABS  tablet, Take 1 tablet by mouth once daily, Disp: 30 tablet, Rfl: 11 .  lidocaine-prilocaine (EMLA) cream, Apply 1 application topically as needed (port access)., Disp: 1 g, Rfl: 3 .  losartan (COZAAR) 50 MG tablet, Take 1 tablet (50 mg total) by mouth daily., Disp: 90 tablet, Rfl: 3 .  magic mouthwash w/lidocaine SOLN, Take 5 mLs by mouth 4 (four) times daily as needed for mouth pain., Disp: 480 mL, Rfl: 3 .  metFORMIN (GLUCOPHAGE) 1000 MG tablet, TAKE 1 TABLET BY MOUTH TWICE DAILY WITH MEALS, Disp: 180 tablet, Rfl: 1 .  nystatin cream (MYCOSTATIN), Apply topically 2 (two) times daily., Disp: 15 g, Rfl: 1 .  oxybutynin (DITROPAN) 5 MG tablet, Take 1 tablet by mouth 1 day or 1 dose., Disp: , Rfl:  .  sertraline (ZOLOFT) 100 MG tablet, Take  1 tablet (100 mg total) by mouth daily., Disp: 90 tablet, Rfl: 3 .  vitamin B-12 (CYANOCOBALAMIN) 1000 MCG tablet, Take by mouth daily. Unsure dose, Disp: , Rfl:  .  vitamin C (ASCORBIC ACID) 500 MG tablet, Take 1,000-1,500 mg by mouth daily as needed (immune support)., Disp: , Rfl:  .  VITAMIN D PO, Take 1 tablet by mouth 1 day or 1 dose., Disp: , Rfl:  .  fluticasone (FLONASE) 50 MCG/ACT nasal spray, Use 2 spray(s) in each nostril once daily (Patient not taking: Reported on 11/04/2019), Disp: 48 g, Rfl: 3 .  HYDROcodone-acetaminophen (NORCO/VICODIN) 5-325 MG tablet, Take 1 tablet by mouth 1 day or 1 dose., Disp: , Rfl:  .  hydrOXYzine (ATARAX/VISTARIL) 25 MG tablet, Take 1 tablet (25 mg total) by mouth every 8 (eight) hours as needed for itching. (Patient not taking: Reported on 11/04/2019), Disp: 30 tablet, Rfl: 3 .  loratadine (CLARITIN) 10 MG tablet, Take 10 mg by mouth daily as needed for allergies. [employer] brand allergy relief, Disp: , Rfl:  .  magnesium hydroxide (MILK OF MAGNESIA) 400 MG/5ML suspension, Take 15 mLs by mouth daily as needed for mild constipation., Disp: , Rfl:  .  OLANZapine (ZYPREXA) 10 MG tablet, Take 1 tablet (10 mg total) by mouth at  bedtime. (Patient not taking: Reported on 10/14/2019), Disp: 30 tablet, Rfl: 1 .  OVER THE COUNTER MEDICATION, Apply 1 application topically daily as needed (pain). Outback topical pain relief, Disp: , Rfl:  No current facility-administered medications for this visit.  Facility-Administered Medications Ordered in Other Visits:  .  Influenza vac split quadrivalent PF (FLUZONE HIGH-DOSE) injection 0.5 mL, 0.5 mL, Intramuscular, Once, Karen Kitchens, NP  Physical exam:  Vitals:   11/04/19 1322  BP: 103/80  Pulse: 74  Resp: 16  Temp: (!) 97.2 F (36.2 C)  TempSrc: Tympanic  SpO2: 97%  Weight: 160 lb 1.6 oz (72.6 kg)   Physical Exam Constitutional:      General: He is not in acute distress. Pulmonary:     Effort: Pulmonary effort is normal.  Skin:    General: Skin is warm and dry.  Neurological:     Mental Status: He is alert and oriented to person, place, and time.      CMP Latest Ref Rng & Units 11/04/2019  Glucose 70 - 99 mg/dL 262(H)  BUN 8 - 23 mg/dL 21  Creatinine 0.61 - 1.24 mg/dL 0.79  Sodium 135 - 145 mmol/L 136  Potassium 3.5 - 5.1 mmol/L 4.4  Chloride 98 - 111 mmol/L 101  CO2 22 - 32 mmol/L 25  Calcium 8.9 - 10.3 mg/dL 9.7  Total Protein 6.5 - 8.1 g/dL 7.0  Total Bilirubin 0.3 - 1.2 mg/dL 1.3(H)  Alkaline Phos 38 - 126 U/L 79  AST 15 - 41 U/L 20  ALT 0 - 44 U/L 15   CBC Latest Ref Rng & Units 11/04/2019  WBC 4.0 - 10.5 K/uL 6.1  Hemoglobin 13.0 - 17.0 g/dL 12.6(L)  Hematocrit 39.0 - 52.0 % 37.7(L)  Platelets 150 - 400 K/uL 177    No images are attached to the encounter.  CT Chest W Contrast  Result Date: 10/24/2019 CLINICAL DATA:  Metastatic urothelial carcinoma EXAM: CT CHEST, ABDOMEN, AND PELVIS WITH CONTRAST TECHNIQUE: Multidetector CT imaging of the chest, abdomen and pelvis was performed following the standard protocol during bolus administration of intravenous contrast. CONTRAST:  12mL OMNIPAQUE IOHEXOL 300 MG/ML  SOLN COMPARISON:  07/08/2019 FINDINGS:  CT CHEST  FINDINGS Cardiovascular: Heart is normal in size.  No pericardial effusion. No evidence of thoracic aortic aneurysm. Atherosclerotic calcifications of the aortic arch. Three vessel coronary atherosclerosis. Right chest port terminates in the lower SVC. Mediastinum/Nodes: No suspicious mediastinal, hilar, or axillary lymphadenopathy. Visualized thyroid is unremarkable. Lungs/Pleura: No suspicious pulmonary nodules. No focal consolidation. Mild subpleural reticulation/fibrosis, lower lobe predominant, compatible with chronic interstitial lung disease, unchanged. No pleural effusion or pneumothorax. Musculoskeletal: Mild degenerative changes of the thoracic spine. CT ABDOMEN PELVIS FINDINGS Hepatobiliary: Liver is within normal limits. No suspicious/enhancing hepatic lesions. Layering 3 mm gallstone (series 2/image 56), without associated inflammatory changes. No intrahepatic or extrahepatic ductal dilatation. Pancreas: Within normal limits. Spleen: Within normal limits. Adrenals/Urinary Tract: Adrenal glands are within normal limits. Scarring/atrophy with hypoenhancement along the anterior left lower kidney. No discrete mass in the lower pole collecting system. Bilateral renal cysts, measuring up to 4.0 cm in the right upper kidney (series 9/image 12), benign (Bosniak I). No hydronephrosis. Thick-walled bladder, although underdistended. Stomach/Bowel: Stomach is within normal limits. No evidence of bowel obstruction. Normal appendix (series 2/image 80). No colonic wall thickening or inflammatory changes. Vascular/Lymphatic: No evidence of abdominal aortic aneurysm. Atherosclerotic calcifications of the abdominal aorta and branch vessels. No suspicious abdominopelvic lymphadenopathy. Reproductive: Prostate is unremarkable. Other: No abdominopelvic ascites. Musculoskeletal: Mild degenerative changes of the lumbar spine. IMPRESSION: Scarring/atrophy with hypoenhancement along the anterior left lower kidney. No  discrete residual soft tissue mass in the left lower pole collecting system. No findings suspicious for metastatic disease. Additional stable ancillary findings as above. Electronically Signed   By: Julian Hy M.D.   On: 10/24/2019 16:37   CT ABDOMEN PELVIS W CONTRAST  Result Date: 10/24/2019 CLINICAL DATA:  Metastatic urothelial carcinoma EXAM: CT CHEST, ABDOMEN, AND PELVIS WITH CONTRAST TECHNIQUE: Multidetector CT imaging of the chest, abdomen and pelvis was performed following the standard protocol during bolus administration of intravenous contrast. CONTRAST:  176mL OMNIPAQUE IOHEXOL 300 MG/ML  SOLN COMPARISON:  07/08/2019 FINDINGS: CT CHEST FINDINGS Cardiovascular: Heart is normal in size.  No pericardial effusion. No evidence of thoracic aortic aneurysm. Atherosclerotic calcifications of the aortic arch. Three vessel coronary atherosclerosis. Right chest port terminates in the lower SVC. Mediastinum/Nodes: No suspicious mediastinal, hilar, or axillary lymphadenopathy. Visualized thyroid is unremarkable. Lungs/Pleura: No suspicious pulmonary nodules. No focal consolidation. Mild subpleural reticulation/fibrosis, lower lobe predominant, compatible with chronic interstitial lung disease, unchanged. No pleural effusion or pneumothorax. Musculoskeletal: Mild degenerative changes of the thoracic spine. CT ABDOMEN PELVIS FINDINGS Hepatobiliary: Liver is within normal limits. No suspicious/enhancing hepatic lesions. Layering 3 mm gallstone (series 2/image 56), without associated inflammatory changes. No intrahepatic or extrahepatic ductal dilatation. Pancreas: Within normal limits. Spleen: Within normal limits. Adrenals/Urinary Tract: Adrenal glands are within normal limits. Scarring/atrophy with hypoenhancement along the anterior left lower kidney. No discrete mass in the lower pole collecting system. Bilateral renal cysts, measuring up to 4.0 cm in the right upper kidney (series 9/image 12), benign  (Bosniak I). No hydronephrosis. Thick-walled bladder, although underdistended. Stomach/Bowel: Stomach is within normal limits. No evidence of bowel obstruction. Normal appendix (series 2/image 80). No colonic wall thickening or inflammatory changes. Vascular/Lymphatic: No evidence of abdominal aortic aneurysm. Atherosclerotic calcifications of the abdominal aorta and branch vessels. No suspicious abdominopelvic lymphadenopathy. Reproductive: Prostate is unremarkable. Other: No abdominopelvic ascites. Musculoskeletal: Mild degenerative changes of the lumbar spine. IMPRESSION: Scarring/atrophy with hypoenhancement along the anterior left lower kidney. No discrete residual soft tissue mass in the left lower pole collecting system. No findings suspicious for  metastatic disease. Additional stable ancillary findings as above. Electronically Signed   By: Julian Hy M.D.   On: 10/24/2019 16:37     Assessment and plan- Patient is a 74 y.o. 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  He is currently on palliative Padcev and here for next cycle of treatment and to discuss CT scan results  CT chest abdomen pelvis does not reveal any significant malignancy at this time.  Previously patient had intra-abdominal adenopathy which has now resolved.  No discrete soft tissue mass in the left lower pole collecting system.  We discussed either continuing Padcev 1 week on 3 weeks off, the way we have been doing it versus giving him a break from treatment.  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  I will therefore see him back in 3 months time with CBC with differential and CMP and a repeat CT chest abdomen and pelvis with contrast.  His blood sugar is more than 250 today and will therefore not be receiving Padcev today as well  Chemo-induced peripheral neuropathy:Continue gabapentin   Visit  Diagnosis 1. Urothelial carcinoma of kidney, left ([place])   2. Goals of care, counseling/discussion   3. Chemotherapy-induced peripheral neuropathy ([place])      Dr. Randa Evens, MD, MPH [facility] [id number] 11/07/2019 4:09 PM

## 2019-11-11 ENCOUNTER — Other Ambulatory Visit: Payer: Self-pay

## 2019-11-11 ENCOUNTER — Ambulatory Visit: Payer: Medicare Other | Attending: Family Medicine

## 2019-11-11 DIAGNOSIS — M6281 Muscle weakness (generalized): Secondary | ICD-10-CM | POA: Insufficient documentation

## 2019-11-11 DIAGNOSIS — R2681 Unsteadiness on feet: Secondary | ICD-10-CM | POA: Diagnosis not present

## 2019-11-11 NOTE — Therapy (Signed)
[place] MAIN [facility] SERVICES [address] [place], [place], [address] Phone: [phone]   Fax:  [phone]  Physical Therapy Treatment  Patient Details  Name: Patrick Mcgee MRN: [id number] Date of Birth: Dec 05, 1945 Referring Provider (PT): Dr. Rosanna Randy   Encounter Date: 11/11/2019  PT End of Session - 11/11/19 1352    Visit Number  39    Number of Visits  49    Date for PT Re-Evaluation  11/19/19    PT Start Time  5188    PT Stop Time  1430    PT Time Calculation (min)  45 min    Equipment Utilized During Treatment  Gait belt    Activity Tolerance  Patient tolerated treatment well;No increased pain    Behavior During Therapy  WFL for tasks assessed/performed       Past Medical History:  Diagnosis Date  . Acid reflux   . Anxiety   . Depression   . Diabetes mellitus without complication ([place])   . Hyperlipidemia   . Hypertension   . MRSA (methicillin resistant Staphylococcus aureus) infection 1992   history  . Myocardial infarction ([place]) 1995  . Sleep apnea    CPAP  . Urothelial cancer ([place]) 11/2017   Left Urothelial mass, chemo tx's    Past Surgical History:  Procedure Laterality Date  . CATARACT EXTRACTION Left   . COLONOSCOPY  2010   [facility]  . COLONOSCOPY WITH PROPOFOL N/A 10/04/2016   Procedure: COLONOSCOPY WITH PROPOFOL;  Surgeon: [name], MD;  Location: [facility] ENDOSCOPY;  Service: Endoscopy;  Laterality: N/A;  . [place], 2000, 2001, 2014  . CYSTOSCOPY W/ RETROGRADES Bilateral 12/10/2017   Procedure: CYSTOSCOPY WITH RETROGRADE PYELOGRAM;  Surgeon: [name], MD;  Location: [facility] ORS;  Service: Urology;  Laterality: Bilateral;  . CYSTOSCOPY W/ RETROGRADES Left 12/09/2018   Procedure: CYSTOSCOPY WITH RETROGRADE PYELOGRAM;  Surgeon: [name], MD;  Location: [facility] ORS;  Service: Urology;  Laterality: Left;  . CYSTOSCOPY WITH BIOPSY Left 12/09/2018   Procedure:  CYSTOSCOPY WITH URETERAL/RENAL PELVIC BIOPSY;  Surgeon: [name], MD;  Location: [facility] ORS;  Service: Urology;  Laterality: Left;  . CYSTOSCOPY WITH STENT PLACEMENT Left 12/10/2017   Procedure: CYSTOSCOPY WITH STENT PLACEMENT;  Surgeon: [name], MD;  Location: [facility] ORS;  Service: Urology;  Laterality: Left;  . CYSTOSCOPY WITH STENT PLACEMENT Left 12/09/2018   Procedure: CYSTOSCOPY WITH STENT PLACEMENT;  Surgeon: [name], MD;  Location: [facility] ORS;  Service: Urology;  Laterality: Left;  . CYSTOSCOPY WITH URETEROSCOPY Left 12/09/2018   Procedure: CYSTOSCOPY WITH URETEROSCOPY;  Surgeon: [name], MD;  Location: [facility] ORS;  Service: Urology;  Laterality: Left;  . EYE SURGERY Bilateral    cataract  . INGUINAL HERNIA REPAIR Right 08/09/2015   Procedure: HERNIA REPAIR INGUINAL ADULT;  Surgeon: [name], MD;  Location: [facility] ORS;  Service: General;  Laterality: Right;  . NASAL SINUS SURGERY    . nuclear stress test    . PORTA CATH INSERTION N/A 12/19/2017   Procedure: PORTA CATH INSERTION;  Surgeon: [name], MD;  Location: [facility];  Service: Cardiovascular;  Laterality: N/A;  . URETERAL BIOPSY Left 12/10/2017   Procedure: URETERAL & renal PELVIS BIOPSY;  Surgeon: [name], MD;  Location: [facility] ORS;  Service: Urology;  Laterality: Left;  . URETEROSCOPY Left 12/10/2017   Procedure: URETEROSCOPY;  Surgeon: [name], MD;  Location: [facility] ORS;  Service: Urology;  Laterality: Left;    There were no vitals filed for this visit.  Subjective Assessment - 11/11/19 1352    Subjective  Pt reports that he is doing well today. No pain reported upon arrival. He did have a fall last week that he didn't report to PT at last visit. Only minor scrape and pain to R wrist which has now resolved. No specific questions or concerns currently. He would like to continue to familiarize himself with the [facility] as he plans to join at discharge.    Pertinent History  Part of  history borrowed from oncology note and verified with patient: Patient is a 74 year old male who was referred for foot drop. He started with Dr. [name] so far for his metastatic urothelial carcinoma.  This was originally diagnosed in May 2019. He was started on chemotherapy in June 2019 and was continued on 05/27/2018 for 8 cycles.  He tolerated chemotherapy well except for chemo-induced anemia for which she has been getting Procrit every 2 weeks.  He did have response to his disease based on scans in August 2019.  However repeat scan on 05/31/2018 showed increase in the size of the primary tumor from 1.2 to 1.9 cm and increase in left para-aortic adenopathy from 0.9 to 1.3 cm.  Second line immunotherapy was recommended.  His initial biopsy specimen did not have enough sample to undergo FGFR mutation testing. He also has mild interstitial lung disease for which he sees pulmonary but he is not on home oxygen.  He has B12 deficiency for which he is on oral B12.  Also has diabetes and coronary artery disease. Patient noted to have disease progression in his lymph nodes in May 2020.  Repeat biopsy showed metastatic urothelial carcinoma which did not have a FGFR mutation.  He has been started on third line Padcev. There is still times when he feels fatigued but he is having more good days. He reports poor appetite. He was working with physical therapy on his foot drop, RLE weakness, and unsteadiness. He states that he gets considerable numbness for the 7-8 days following his chemotherapy treatment. He states that he is a diabetic and did have some numbness prior to starting chemotherapy. He states that he has a history of a low back disc bulge with RLE radicular symptoms. He used to get spinal injections but reports he hasn't had any injections in the last 10 years.    Limitations  Walking    Currently in Pain?  No/denies           TREATMENT   Ther-ex In [place] performed: Octane xRide L6 x 5 minutes  for warm-up during history, additional time taken to explain settings on machine and foster independence (2 minutes unbilled); HOIST BLE leg press (seat position 8) plate 8 (811#) x 20, plate 10 (914#) x 20; HOIST BLE heel raises (seat position 8) plate 8 (782#) 2 x 20; HOIST rows (seat position 7) plate 2 (95#) x 20, plate 3 (62#) x 20; HOIST knee extension (back position 5, leg 4) plate 3 (13#) x 20, plate 4 (08#) x 20; HOIST HS curls (back position 5, leg 14) plate 5 (65#) x 20, plate 6 (78#) x 20; HOIST chest press (seat position 6, arm position 1, vertical handles) plate 1 (8#) 2 x 10; HOIST bicep curls plate 2, 2 x 10; HOIST tricep extension plate 5, 2 x 10;   Pt educated throughout session about proper posture and technique with exercises. Improved exercise technique, movement  at target joints, use of target muscles after min to mod verbal, visual, tactile cues.   Pt demonstrates excellent motivation during session today. Continued to work on exercises in the [place] to foster independence and encourage pt to join after discharge from therapy. He is demonstrating improved strength and balance during session today. Will need updated outcome measures, goals, and a progress note at next visit.Pt will benefit from PT services to address deficits in strength, balance, and mobility in order to return to full function at home.                       PT Short Term Goals - 08/27/19 1307      PT SHORT TERM GOAL #1   Title  Pt will be independent with HEP in order to improve strength and balance in order to decrease fall risk and improve function at home    Time  6    Period  Weeks    Status  On-going    Target Date  07/14/19        PT Long Term Goals - 10/08/19 1710      PT LONG TERM GOAL #1   Title  Pt will improve BERG by at least 3 points in order to demonstrate clinically significant improvement in balance.    Time  12    Status  Achieved      PT LONG  TERM GOAL #2   Title  Pt will improve ABC by at least 13% in order to demonstrate clinically significant improvement in balance confidence.    Baseline  06/02/19: 64.4%; 07/07/19: 65%; 08/27/19: 65%,, 10/08/19=70%    Time  12    Period  Weeks    Status  On-going    Target Date  11/19/19      PT LONG TERM GOAL #3   Title  Pt will increase 30s Sit to Stand Test to greater than or equal to 14 repetitions in order to demonstrate clinically significant improvement in LE endurance and decrease in fall risk.    Baseline  06/02/19: 10 repetitions; 07/07/19: 13 repetitions; 08/27/19: 14.5 repetitions    Status  Achieved      PT LONG TERM GOAL #4   Title  Pt will increase 6MWT by at least 194m(3250f in order to demonstrate clinically significant improvement in cardiopulmonary endurance and community ambulation    Baseline  06/10/20: 137579fno AD, no LOB, Rt foot slap without flat foot strike; 07/07/19: test terminated 2/2 fall; 07/09/19: 1565' with R AFO, no assistive device, mask donned; 08/27/19: 1615' with R AFO, no assistive, mask donned, 3/31/211600 ft    Time  12    Period  Weeks    Status  Partially Met    Target Date  11/19/19      PT LONG TERM GOAL #5   Title  Pt will improve FGA by at least 4 points in order to demonstrate clinically significant improvement in balance and decreased risk for falls.    Baseline  10/08/19=26/30    Period  Weeks    Status  Partially Met    Target Date  11/19/19            Plan - 11/11/19 1352    Clinical Impression Statement  Pt demonstrates excellent motivation during session today. Continued to work on exercises in the [place] foster independence and encourage pt to join after discharge from therapy. He is demonstrating improved strength and balance during  session today. Will need updated outcome measures, goals, and a progress note at next visit. Pt will benefit from PT services to address deficits in strength, balance, and mobility in order to  return to full function at home.    Personal Factors and Comorbidities  Age;Comorbidity 3+;Time since onset of injury/illness/exacerbation    Comorbidities  Urothelial cancer, DM, previous lumbar disc bulge, interstitial pulmonary disease    Examination-Activity Limitations  Locomotion Level;Stairs;Transfers    Examination-Participation Restrictions  Community Activity;Laundry;Shop    Stability/Clinical Decision Making  Evolving/Moderate complexity    Rehab Potential  Fair    PT Frequency  2x / week    PT Duration  12 weeks    PT Treatment/Interventions  ADLs/Self Care Home Management;Aquatic Therapy;Biofeedback;Canalith Repostioning;Cryotherapy;Iontophoresis '4mg'$ /ml Dexamethasone;Moist Heat;Traction;Ultrasound;DME Instruction;Gait training;Stair training;Functional mobility training;Therapeutic activities;Therapeutic exercise;Balance training;Neuromuscular re-education;Patient/family education;Orthotic Fit/Training;Manual techniques;Dry needling;Energy conservation;Vestibular;Joint Manipulations    PT Next Visit Plan  Update outcome measures, goals, progress note; Continue with strengthening and balance    PT Home Exercise Plan  Access Code: [email or web address]    Consulted and Agree with Plan of Care  Patient       Patient will benefit from skilled therapeutic intervention in order to improve the following deficits and impairments:  Abnormal gait, Decreased activity tolerance, Decreased balance, Decreased strength  Visit Diagnosis: Unsteadiness on feet  Muscle weakness (generalized)     Problem List Patient Active Problem List   Diagnosis Date Noted  . Chest pain 03/04/2019  . Anemia due to antineoplastic chemotherapy 03/03/2018  . Cough 03/03/2018  . Chemotherapy induced neutropenia ([place]) 01/22/2018  . B12 deficiency 01/01/2018  . Hypomagnesemia 12/21/2017  . Encounter for antineoplastic chemotherapy 12/21/2017  . Malignant neoplasm of kidney ([place])   . Urothelial cancer ([place]) 12/18/2017   . Malnutrition of moderate degree 12/18/2017  . Therapeutic opioid induced constipation   . Palliative care encounter   . Dehydration 12/17/2017  . Goals of care, counseling/discussion 12/13/2017  . Urothelial carcinoma of kidney, left ([place]) 12/07/2017  . Right inguinal hernia 07/17/2015  . Family history of colon cancer 07/17/2015  . Allergic rhinitis 11/12/2014  . Airway hyperreactivity 11/12/2014  . Atherosclerosis of coronary artery 11/12/2014  . Cheilitis 11/12/2014  . Narrowing of intervertebral disc space 11/12/2014  . Deflected nasal septum 11/12/2014  . HLD (hyperlipidemia) 11/12/2014  . BP (high blood pressure) 11/12/2014  . Adult hypothyroidism 11/12/2014  . Sleep apnea 11/12/2014  . Diabetes mellitus, type 2 ([place]) 11/12/2014  . Degenerative disc disease, lumbar 09/24/2012  . Furunculosis 04/23/2012   Phillips Grout PT, DPT, GCS  Jayceion Lisenby 11/11/2019, 4:55 PM  [place] MAIN [facility] SERVICES [address] [place], [place], [address] Phone: [phone]   Fax:  [phone]  Name: Patrick Mcgee MRN: [id number] Date of Birth: 1945-10-11

## 2019-11-13 ENCOUNTER — Ambulatory Visit: Payer: Medicare Other

## 2019-11-13 ENCOUNTER — Other Ambulatory Visit: Payer: Self-pay

## 2019-11-13 VITALS — BP 145/90 | HR 76

## 2019-11-13 DIAGNOSIS — M6281 Muscle weakness (generalized): Secondary | ICD-10-CM | POA: Diagnosis not present

## 2019-11-13 DIAGNOSIS — R2681 Unsteadiness on feet: Secondary | ICD-10-CM

## 2019-11-13 NOTE — Therapy (Signed)
[place] MAIN [facility] SERVICES [address] [place], [place], [address] Phone: [phone]   Fax:  [phone]  Physical Therapy Progress Note   Dates of reporting period  10/08/19   to   11/13/19  Patient Details  Name: Patrick Mcgee MRN: [id number] Date of Birth: Nov 21, 1945 Referring Provider (PT): Dr. Rosanna Randy   Encounter Date: 11/13/2019  PT End of Session - 11/13/19 1653    Visit Number  40    Number of Visits  49    Date for PT Re-Evaluation  11/19/19    PT Start Time  4081    PT Stop Time  1730    PT Time Calculation (min)  40 min    Equipment Utilized During Treatment  Gait belt    Activity Tolerance  Patient tolerated treatment well;No increased pain    Behavior During Therapy  WFL for tasks assessed/performed       Past Medical History:  Diagnosis Date  . Acid reflux   . Anxiety   . Depression   . Diabetes mellitus without complication ([place])   . Hyperlipidemia   . Hypertension   . MRSA (methicillin resistant Staphylococcus aureus) infection 1992   history  . Myocardial infarction ([place]) 1995  . Sleep apnea    CPAP  . Urothelial cancer ([place]) 11/2017   Left Urothelial mass, chemo tx's    Past Surgical History:  Procedure Laterality Date  . CATARACT EXTRACTION Left   . COLONOSCOPY  2010   [facility]  . COLONOSCOPY WITH PROPOFOL N/A 10/04/2016   Procedure: COLONOSCOPY WITH PROPOFOL;  Surgeon: [name], MD;  Location: [facility] ENDOSCOPY;  Service: Endoscopy;  Laterality: N/A;  . [place], 2000, 2001, 2014  . CYSTOSCOPY W/ RETROGRADES Bilateral 12/10/2017   Procedure: CYSTOSCOPY WITH RETROGRADE PYELOGRAM;  Surgeon: [name], MD;  Location: [facility] ORS;  Service: Urology;  Laterality: Bilateral;  . CYSTOSCOPY W/ RETROGRADES Left 12/09/2018   Procedure: CYSTOSCOPY WITH RETROGRADE PYELOGRAM;  Surgeon: [name], MD;  Location: [facility] ORS;  Service: Urology;  Laterality: Left;  .  CYSTOSCOPY WITH BIOPSY Left 12/09/2018   Procedure: CYSTOSCOPY WITH URETERAL/RENAL PELVIC BIOPSY;  Surgeon: [name], MD;  Location: [facility] ORS;  Service: Urology;  Laterality: Left;  . CYSTOSCOPY WITH STENT PLACEMENT Left 12/10/2017   Procedure: CYSTOSCOPY WITH STENT PLACEMENT;  Surgeon: [name], MD;  Location: [facility] ORS;  Service: Urology;  Laterality: Left;  . CYSTOSCOPY WITH STENT PLACEMENT Left 12/09/2018   Procedure: CYSTOSCOPY WITH STENT PLACEMENT;  Surgeon: [name], MD;  Location: [facility] ORS;  Service: Urology;  Laterality: Left;  . CYSTOSCOPY WITH URETEROSCOPY Left 12/09/2018   Procedure: CYSTOSCOPY WITH URETEROSCOPY;  Surgeon: [name], MD;  Location: [facility] ORS;  Service: Urology;  Laterality: Left;  . EYE SURGERY Bilateral    cataract  . INGUINAL HERNIA REPAIR Right 08/09/2015   Procedure: HERNIA REPAIR INGUINAL ADULT;  Surgeon: [name], MD;  Location: [facility] ORS;  Service: General;  Laterality: Right;  . NASAL SINUS SURGERY    . nuclear stress test    . PORTA CATH INSERTION N/A 12/19/2017   Procedure: PORTA CATH INSERTION;  Surgeon: [name], MD;  Location: [place] CV LAB;  Service: Cardiovascular;  Laterality: N/A;  . URETERAL BIOPSY Left 12/10/2017   Procedure: URETERAL & renal PELVIS BIOPSY;  Surgeon: [name], MD;  Location: [facility] ORS;  Service: Urology;  Laterality: Left;  . URETEROSCOPY Left 12/10/2017  Procedure: URETEROSCOPY;  Surgeon: [name], MD;  Location: [facility] ORS;  Service: Urology;  Laterality: Left;    Vitals:   11/13/19 1655  BP: (!) 145/90  Pulse: 76  SpO2: 98%    Subjective Assessment - 11/13/19 1652    Subjective  Pt reports that he is doing well today. No pain reported upon arrival. No falls since last therapy session. No specific questions or concerns currently.    Pertinent History  Part of history borrowed from oncology note and verified with patient: Patient is a 74 year old male who was referred for foot  drop. He started with Dr. [name] so far for his metastatic urothelial carcinoma.  This was originally diagnosed in May 2019. He was started on chemotherapy in June 2019 and was continued on 05/27/2018 for 8 cycles.  He tolerated chemotherapy well except for chemo-induced anemia for which she has been getting Procrit every 2 weeks.  He did have response to his disease based on scans in August 2019.  However repeat scan on 05/31/2018 showed increase in the size of the primary tumor from 1.2 to 1.9 cm and increase in left para-aortic adenopathy from 0.9 to 1.3 cm.  Second line immunotherapy was recommended.  His initial biopsy specimen did not have enough sample to undergo FGFR mutation testing. He also has mild interstitial lung disease for which he sees pulmonary but he is not on home oxygen.  He has B12 deficiency for which he is on oral B12.  Also has diabetes and coronary artery disease. Patient noted to have disease progression in his lymph nodes in May 2020.  Repeat biopsy showed metastatic urothelial carcinoma which did not have a FGFR mutation.  He has been started on third line Padcev. There is still times when he feels fatigued but he is having more good days. He reports poor appetite. He was working with physical therapy on his foot drop, RLE weakness, and unsteadiness. He states that he gets considerable numbness for the 7-8 days following his chemotherapy treatment. He states that he is a diabetic and did have some numbness prior to starting chemotherapy. He states that he has a history of a low back disc bulge with RLE radicular symptoms. He used to get spinal injections but reports he hasn't had any injections in the last 10 years.    Limitations  Walking    Currently in Pain?  No/denies         [facility] PT Assessment - 11/13/19 1706      6 Minute Walk- Baseline   6 Minute Walk- Baseline  yes    BP (mmHg)  145/90    HR (bpm)  76    02 Sat (%RA)  98 %    Modified Borg Scale for Dyspnea  0-  Nothing at all    Perceived Rate of Exertion (Borg)  6-      6 Minute walk- Post Test   6 Minute Walk Post Test  yes    BP (mmHg)  (!) 188/98    HR (bpm)  130    02 Sat (%RA)  98 %    Modified Borg Scale for Dyspnea  4- somewhat severe    Perceived Rate of Exertion (Borg)  7- Very, very light      6 minute walk test results    Aerobic Endurance Distance Walked  1780    Endurance additional comments  no assistive device, mask donned      Functional Gait  Assessment   Gait  assessed   Yes    Gait Level Surface  Walks 20 ft in less than 5.5 sec, no assistive devices, good speed, no evidence for imbalance, normal gait pattern, deviates no more than 6 in outside of the 12 in walkway width.    Change in Gait Speed  Able to smoothly change walking speed without loss of balance or gait deviation. Deviate no more than 6 in outside of the 12 in walkway width.    Gait with Horizontal Head Turns  Performs head turns smoothly with no change in gait. Deviates no more than 6 in outside 12 in walkway width    Gait with Vertical Head Turns  Performs head turns with no change in gait. Deviates no more than 6 in outside 12 in walkway width.    Gait and Pivot Turn  Pivot turns safely within 3 sec and stops quickly with no loss of balance.    Step Over Obstacle  Is able to step over 2 stacked shoe boxes taped together (9 in total height) without changing gait speed. No evidence of imbalance.    Gait with Narrow Base of Support  Ambulates 4-7 steps.    Gait with Eyes Closed  Walks 20 ft, uses assistive device, slower speed, mild gait deviations, deviates 6-10 in outside 12 in walkway width. Ambulates 20 ft in less than 9 sec but greater than 7 sec.    Ambulating Backwards  Walks 20 ft, no assistive devices, good speed, no evidence for imbalance, normal gait    Steps  Alternating feet, must use rail.    Total Score  26         TREATMENT   Ther-ex Updated outcome measures with patient including: ABC:  76.25% FGA: 26/30 30s Sit to Stand Test: 15 reps 6MWT: 1780' no assistive device, mask donned  Precor BLE leg press 120# 2 x20, pt requires assist for first repetition of each set; Precor BLEheel raises 85#2 x 20;   Outcome measures and goals updated with patient today. FGA is unchanged from when it was last updated at 26/30. Pt continues to struggle with tandem gait during testing. His 6MWT has improved significantly to 1780' today and his 30s Sit to Stand Test has increased to a full 15 repetitions. His balance confidence has also improved and is now 76.25% which is above the cut-off for increased fall risk. Pt has made significant improvements over his course of time in therapy. Will review HEP at next session and pt will be ready for discharge. He will benefit from additional PT services to address deficits in strength, balance, and mobility in order to return to full function at home as full benefit has not yet been achieved.                       PT Short Term Goals - 11/13/19 1654      PT SHORT TERM GOAL #1   Title  Pt will be independent with HEP in order to improve strength and balance in order to decrease fall risk and improve function at home    Baseline  11/13/19: Performing intermittently but inconsisten    Time  6    Period  Weeks    Status  Partially Met    Target Date  07/14/19        PT Long Term Goals - 11/13/19 1655      PT LONG TERM GOAL #1   Title  Pt will improve BERG  by at least 3 points in order to demonstrate clinically significant improvement in balance.    Baseline  06/02/19: 48/56; 07/08/19: 54/56; 08/27/19: 53/56    Time  12    Period  Weeks    Status  Achieved      PT LONG TERM GOAL #2   Title  Pt will improve ABC by at least 13% in order to demonstrate clinically significant improvement in balance confidence.    Baseline  06/02/19: 64.4%; 07/07/19: 65%; 08/27/19: 65%,, 10/08/19=70%; 11/13/19: 76.25%    Time  12    Period  Weeks     Status  Partially Met    Target Date  11/19/19      PT LONG TERM GOAL #3   Title  Pt will increase 30s Sit to Stand Test to greater than or equal to 14 repetitions in order to demonstrate clinically significant improvement in LE endurance and decrease in fall risk.    Baseline  06/02/19: 10 repetitions; 07/07/19: 13 repetitions; 08/27/19: 14.5 repetitions; 11/13/19: 15 repetitions    Status  Achieved      PT LONG TERM GOAL #4   Title  Pt will increase 6MWT by at least 173m(3250f in order to demonstrate clinically significant improvement in cardiopulmonary endurance and community ambulation    Baseline  06/10/20: 137530fno AD, no LOB, Rt foot slap without flat foot strike; 07/07/19: test terminated 2/2 fall; 07/09/19: 1565' with R AFO, no assistive device, mask donned; 08/27/19: 1615' with R AFO, no assistive, mask donned, 10/08/19: 1600 ft; 11/13/19: 1780' no assistive deive, mask donned    Time  12    Period  Weeks    Status  Achieved      PT LONG TERM GOAL #5   Title  Pt will improve FGA by at least 4 points in order to demonstrate clinically significant improvement in balance and decreased risk for falls.    Baseline  10/08/19=26/30; 11/13/19: 26/30    Time  12    Period  Weeks    Status  Partially Met    Target Date  11/19/19            Plan - 11/13/19 1653    Clinical Impression Statement  Outcome measures and goals updated with patient today. FGA is unchanged from when it was last updated at 26/30. Pt continues to struggle with tandem gait during testing. His 6MWT has improved significantly to 1780' today and his 30s Sit to Stand Test has increased to a full 15 repetitions. His balance confidence has also improved and is now 76.25% which is above the cut-off for increased fall risk. Pt has made significant improvements over his course of time in therapy. Will review HEP at next session and pt will be ready for discharge. He will benefit from additional PT services to address deficits  in strength, balance, and mobility in order to return to full function at home as full benefit has not yet been achieved.    Personal Factors and Comorbidities  Age;Comorbidity 3+;Time since onset of injury/illness/exacerbation    Comorbidities  Urothelial cancer, DM, previous lumbar disc bulge, interstitial pulmonary disease    Examination-Activity Limitations  Locomotion Level;Stairs;Transfers    Examination-Participation Restrictions  Community Activity;Laundry;Shop    Stability/Clinical Decision Making  Evolving/Moderate complexity    Rehab Potential  Fair    PT Frequency  2x / week    PT Duration  12 weeks    PT Treatment/Interventions  ADLs/Self Care Home Management;Aquatic Therapy;Biofeedback;Canalith Repostioning;Cryotherapy;Iontophoresis  37m/ml Dexamethasone;Moist Heat;Traction;Ultrasound;DME Instruction;Gait training;Stair training;Functional mobility training;Therapeutic activities;Therapeutic exercise;Balance training;Neuromuscular re-education;Patient/family education;Orthotic Fit/Training;Manual techniques;Dry needling;Energy conservation;Vestibular;Joint Manipulations    PT Next Visit Plan  Update outcome measures, goals, progress note; Continue with strengthening and balance    PT Home Exercise Plan  Access Code: [email or web address]   Consulted and Agree with Plan of Care  Patient       Patient will benefit from skilled therapeutic intervention in order to improve the following deficits and impairments:  Abnormal gait, Decreased activity tolerance, Decreased balance, Decreased strength  Visit Diagnosis: Unsteadiness on feet  Muscle weakness (generalized)     Problem List Patient Active Problem List   Diagnosis Date Noted  . Chest pain 03/04/2019  . Anemia due to antineoplastic chemotherapy 03/03/2018  . Cough 03/03/2018  . Chemotherapy induced neutropenia ([place] 01/22/2018  . B12 deficiency 01/01/2018  . Hypomagnesemia 12/21/2017  . Encounter for antineoplastic chemotherapy  12/21/2017  . Malignant neoplasm of kidney ([place]   . [place]thelial cancer ([place] 12/18/2017  . Malnutrition of moderate degree 12/18/2017  . Therapeutic opioid induced constipation   . Palliative care encounter   . Dehydration 12/17/2017  . Goals of care, counseling/discussion 12/13/2017  . Urothelial carcinoma of kidney, left ([place] 12/07/2017  . Right inguinal hernia 07/17/2015  . Family history of colon cancer 07/17/2015  . Allergic rhinitis 11/12/2014  . Airway hyperreactivity 11/12/2014  . Atherosclerosis of coronary artery 11/12/2014  . Cheilitis 11/12/2014  . Narrowing of intervertebral disc space 11/12/2014  . Deflected nasal septum 11/12/2014  . HLD (hyperlipidemia) 11/12/2014  . BP (high blood pressure) 11/12/2014  . Adult hypothyroidism 11/12/2014  . Sleep apnea 11/12/2014  . Diabetes mellitus, type 2 ([place] 11/12/2014  . Degenerative disc disease, lumbar 09/24/2012  . Furunculosis 04/23/2012   JPhillips GroutPT, DPT, GCS  Yarely Bebee 11/13/2019, 5:31 PM  [facility] [address] [address]Phone: [phone]  Fax:  [phone] Name: Patrick CARMICKLEMRN: [id number] of Birth: [birth year]

## 2019-11-17 ENCOUNTER — Ambulatory Visit: Payer: Medicare Other

## 2019-11-19 ENCOUNTER — Other Ambulatory Visit: Payer: Self-pay

## 2019-11-19 ENCOUNTER — Ambulatory Visit: Payer: Medicare Other

## 2019-11-19 DIAGNOSIS — M6281 Muscle weakness (generalized): Secondary | ICD-10-CM | POA: Diagnosis not present

## 2019-11-19 DIAGNOSIS — R2681 Unsteadiness on feet: Secondary | ICD-10-CM | POA: Diagnosis not present

## 2019-11-19 NOTE — Therapy (Addendum)
[place] MAIN [facility] SERVICES [address] [place], [place], [address] Phone: [phone]   Fax:  [phone]  Physical Therapy Treatment/Discharge  Patient Details  Name: Patrick Mcgee MRN: [id number] Date of Birth: January 29, 1946 Referring Provider (PT): Dr. Rosanna Randy   Encounter Date: 11/19/2019  PT End of Session - 11/19/19 1643    Visit Number  41    Number of Visits  49    Date for PT Re-Evaluation  11/19/19    PT Start Time  6195    PT Stop Time  1730    PT Time Calculation (min)  45 min    Equipment Utilized During Treatment  Gait belt    Activity Tolerance  Patient tolerated treatment well;No increased pain    Behavior During Therapy  WFL for tasks assessed/performed       Past Medical History:  Diagnosis Date  . Acid reflux   . Anxiety   . Depression   . Diabetes mellitus without complication ([place])   . Hyperlipidemia   . Hypertension   . MRSA (methicillin resistant Staphylococcus aureus) infection 1992   history  . Myocardial infarction ([place]) 1995  . Sleep apnea    CPAP  . Urothelial cancer ([place]) 11/2017   Left Urothelial mass, chemo tx's    Past Surgical History:  Procedure Laterality Date  . CATARACT EXTRACTION Left   . COLONOSCOPY  2010   [facility]  . COLONOSCOPY WITH PROPOFOL N/A 10/04/2016   Procedure: COLONOSCOPY WITH PROPOFOL;  Surgeon: [name], MD;  Location: [facility] ENDOSCOPY;  Service: Endoscopy;  Laterality: N/A;  . [place], 2000, 2001, 2014  . CYSTOSCOPY W/ RETROGRADES Bilateral 12/10/2017   Procedure: CYSTOSCOPY WITH RETROGRADE PYELOGRAM;  Surgeon: [name], MD;  Location: [facility] ORS;  Service: Urology;  Laterality: Bilateral;  . CYSTOSCOPY W/ RETROGRADES Left 12/09/2018   Procedure: CYSTOSCOPY WITH RETROGRADE PYELOGRAM;  Surgeon: [name], MD;  Location: [facility] ORS;  Service: Urology;  Laterality: Left;  . CYSTOSCOPY WITH BIOPSY Left 12/09/2018    Procedure: CYSTOSCOPY WITH URETERAL/RENAL PELVIC BIOPSY;  Surgeon: [name], MD;  Location: [facility] ORS;  Service: Urology;  Laterality: Left;  . CYSTOSCOPY WITH STENT PLACEMENT Left 12/10/2017   Procedure: CYSTOSCOPY WITH STENT PLACEMENT;  Surgeon: [name], MD;  Location: [facility] ORS;  Service: Urology;  Laterality: Left;  . CYSTOSCOPY WITH STENT PLACEMENT Left 12/09/2018   Procedure: CYSTOSCOPY WITH STENT PLACEMENT;  Surgeon: [name], MD;  Location: [facility] ORS;  Service: Urology;  Laterality: Left;  . CYSTOSCOPY WITH URETEROSCOPY Left 12/09/2018   Procedure: CYSTOSCOPY WITH URETEROSCOPY;  Surgeon: [name], MD;  Location: [facility] ORS;  Service: Urology;  Laterality: Left;  . EYE SURGERY Bilateral    cataract  . INGUINAL HERNIA REPAIR Right 08/09/2015   Procedure: HERNIA REPAIR INGUINAL ADULT;  Surgeon: [name], MD;  Location: [facility] ORS;  Service: General;  Laterality: Right;  . NASAL SINUS SURGERY    . nuclear stress test    . PORTA CATH INSERTION N/A 12/19/2017   Procedure: PORTA CATH INSERTION;  Surgeon: [name], MD;  Location: [facility] CV LAB;  Service: Cardiovascular;  Laterality: N/A;  . URETERAL BIOPSY Left 12/10/2017   Procedure: URETERAL & renal PELVIS BIOPSY;  Surgeon: [name], MD;  Location: [facility] ORS;  Service: Urology;  Laterality: Left;  . URETEROSCOPY Left 12/10/2017   Procedure: URETEROSCOPY;  Surgeon: [name], MD;  Location: [facility] ORS;  Service: Urology;  Laterality: Left;    There were no vitals filed for this visit.  Subjective Assessment - 11/19/19 1643    Subjective  Pt reports that he is doing well today. No pain reported upon arrival. No falls since last therapy session. No specific questions or concerns currently.    Pertinent History  Part of history borrowed from oncology note and verified with patient: Patient is a 74 year old male who was referred for foot drop. He started with Dr. [name] so far for his metastatic  urothelial carcinoma.  This was originally diagnosed in May 2019. He was started on chemotherapy in June 2019 and was continued on 05/27/2018 for 8 cycles.  He tolerated chemotherapy well except for chemo-induced anemia for which she has been getting Procrit every 2 weeks.  He did have response to his disease based on scans in August 2019.  However repeat scan on 05/31/2018 showed increase in the size of the primary tumor from 1.2 to 1.9 cm and increase in left para-aortic adenopathy from 0.9 to 1.3 cm.  Second line immunotherapy was recommended.  His initial biopsy specimen did not have enough sample to undergo FGFR mutation testing. He also has mild interstitial lung disease for which he sees pulmonary but he is not on home oxygen.  He has B12 deficiency for which he is on oral B12.  Also has diabetes and coronary artery disease. Patient noted to have disease progression in his lymph nodes in May 2020.  Repeat biopsy showed metastatic urothelial carcinoma which did not have a FGFR mutation.  He has been started on third line Padcev. There is still times when he feels fatigued but he is having more good days. He reports poor appetite. He was working with physical therapy on his foot drop, RLE weakness, and unsteadiness. He states that he gets considerable numbness for the 7-8 days following his chemotherapy treatment. He states that he is a diabetic and did have some numbness prior to starting chemotherapy. He states that he has a history of a low back disc bulge with RLE radicular symptoms. He used to get spinal injections but reports he hasn't had any injections in the last 10 years.    Limitations  Walking    Currently in Pain?  No/denies          TREATMENT   Ther-ex Octane xRide L4 for warm-up x 5 minutes (4 minutes unbilled); Precor BLE leg press 120#x20,130# x 20, pt requires assist for first repetition of second set; Precor BLEheel raises 85# 2 x 20; Matrix resisted gait 22.5#  forward/backward/R lateral/L lateral x 3 each direction; Sit to stand from regular height chair with Airex pad under feet and 5kg overhead press x 10; Reviewed gym-based exercise program with handout including all of patients settings;   Neuromuscular Re-education 1/2 foam roll A/P balance without UE support x 60s; 1/2 foam roll A/P heel/toe rocking without UE support x 10 each direction; 1/2 foam roll tandem balance alternating forward LE 30s x 2 each; Airex NBOS dynamic tossing with dartboard with patient having to turn to reach for ball to the left and then toss x 8 minutes, CGA/minA+1 provided throughout;   Pt educated throughout session about proper posture and technique with exercises. Improved exercise technique, movement at target joints, use of target muscles after min to mod verbal, visual, tactile cues.   Outcome measures and goals updated with patient during last visit. FGA was unchanged from when it was last updated at 26/30. Pt continues to struggle  with tandem gait during testing. His 6MWT improved significantly to 1780' and his 30s Sit to Stand Test increased to a full 15 repetitions. His balance confidence has also improved and was 76.25% last visit which is above the cut-off for increased fall risk. Pt has made significant improvements over his course of time in therapy. Reviewed gym-based program with patient today and pt given notes with all his machine settings and weights. He is being discharged on this date. Pt encouraged to follow up with MD as needed and if additional therapy is necessary to obtain a new order.                        PT Short Term Goals - 11/19/19 1644      PT SHORT TERM GOAL #1   Title  Pt will be independent with HEP in order to improve strength and balance in order to decrease fall risk and improve function at home    Baseline  11/13/19: Performing intermittently but inconsistently    Time  6    Period  Weeks    Status   Partially Met    Target Date  --        PT Long Term Goals - 11/19/19 1644      PT LONG TERM GOAL #1   Title  Pt will improve BERG by at least 3 points in order to demonstrate clinically significant improvement in balance.    Baseline  06/02/19: 48/56; 07/08/19: 54/56; 08/27/19: 53/56    Time  12    Period  Weeks    Status  Achieved      PT LONG TERM GOAL #2   Title  Pt will improve ABC by at least 13% in order to demonstrate clinically significant improvement in balance confidence.    Baseline  06/02/19: 64.4%; 07/07/19: 65%; 08/27/19: 65%,, 10/08/19=70%; 11/13/19: 76.25%    Time  12    Period  Weeks    Status  Partially Met      PT LONG TERM GOAL #3   Title  Pt will increase 30s Sit to Stand Test to greater than or equal to 14 repetitions in order to demonstrate clinically significant improvement in LE endurance and decrease in fall risk.    Baseline  06/02/19: 10 repetitions; 07/07/19: 13 repetitions; 08/27/19: 14.5 repetitions; 11/13/19: 15 repetitions    Status  Achieved      PT LONG TERM GOAL #4   Title  Pt will increase 6MWT by at least 131m(3247f in order to demonstrate clinically significant improvement in cardiopulmonary endurance and community ambulation    Baseline  06/10/20: 137565fno AD, no LOB, Rt foot slap without flat foot strike; 07/07/19: test terminated 2/2 fall; 07/09/19: 1565' with R AFO, no assistive device, mask donned; 08/27/19: 1615' with R AFO, no assistive, mask donned, 10/08/19: 1600 ft; 11/13/19: 1780' no assistive deive, mask donned    Time  12    Period  Weeks    Status  Achieved      PT LONG TERM GOAL #5   Title  Pt will improve FGA by at least 4 points in order to demonstrate clinically significant improvement in balance and decreased risk for falls.    Baseline  10/08/19=26/30; 11/13/19: 26/30    Time  12    Period  Weeks    Status  Partially Met            Plan - 11/19/19 1644  Clinical Impression Statement  Outcome measures and goals updated  with patient during last visit. FGA was unchanged from when it was last updated at 26/30. Pt continues to struggle with tandem gait during testing. His 6MWT improved significantly to 1780' and his 30s Sit to Stand Test increased to a full 15 repetitions. His balance confidence has also improved and was 76.25% last visit which is above the cut-off for increased fall risk. Pt has made significant improvements over his course of time in therapy. Reviewed gym-based program with patient today and pt given notes with all his machine settings and weights. He is being discharged on this date. Pt encouraged to follow up with MD as needed and if additional therapy is necessary to obtain a new order.    Personal Factors and Comorbidities  Age;Comorbidity 3+;Time since onset of injury/illness/exacerbation    Comorbidities  Urothelial cancer, DM, previous lumbar disc bulge, interstitial pulmonary disease    Examination-Activity Limitations  Locomotion Level;Stairs;Transfers    Examination-Participation Restrictions  Community Activity;Laundry;Shop    Stability/Clinical Decision Making  Evolving/Moderate complexity    Rehab Potential  Fair    PT Frequency  2x / week    PT Duration  12 weeks    PT Treatment/Interventions  ADLs/Self Care Home Management;Aquatic Therapy;Biofeedback;Canalith Repostioning;Cryotherapy;Iontophoresis '4mg'$ /ml Dexamethasone;Moist Heat;Traction;Ultrasound;DME Instruction;Gait training;Stair training;Functional mobility training;Therapeutic activities;Therapeutic exercise;Balance training;Neuromuscular re-education;Patient/family education;Orthotic Fit/Training;Manual techniques;Dry needling;Energy conservation;Vestibular;Joint Manipulations    PT Next Visit Plan  Discharge    PT Home Exercise Plan  Access Code: [email or web address]    Consulted and Agree with Plan of Care  Patient       Patient will benefit from skilled therapeutic intervention in order to improve the following deficits and impairments:   Abnormal gait, Decreased activity tolerance, Decreased balance, Decreased strength  Visit Diagnosis: Unsteadiness on feet  Muscle weakness (generalized)     Problem List Patient Active Problem List   Diagnosis Date Noted  . Chest pain 03/04/2019  . Anemia due to antineoplastic chemotherapy 03/03/2018  . Cough 03/03/2018  . Chemotherapy induced neutropenia ([place]) 01/22/2018  . B12 deficiency 01/01/2018  . Hypomagnesemia 12/21/2017  . Encounter for antineoplastic chemotherapy 12/21/2017  . Malignant neoplasm of kidney ([place])   . Urothelial cancer ([place]) 12/18/2017  . Malnutrition of moderate degree 12/18/2017  . Therapeutic opioid induced constipation   . Palliative care encounter   . Dehydration 12/17/2017  . Goals of care, counseling/discussion 12/13/2017  . Urothelial carcinoma of kidney, left ([place]) 12/07/2017  . Right inguinal hernia 07/17/2015  . Family history of colon cancer 07/17/2015  . Allergic rhinitis 11/12/2014  . Airway hyperreactivity 11/12/2014  . Atherosclerosis of coronary artery 11/12/2014  . Cheilitis 11/12/2014  . Narrowing of intervertebral disc space 11/12/2014  . Deflected nasal septum 11/12/2014  . HLD (hyperlipidemia) 11/12/2014  . BP (high blood pressure) 11/12/2014  . Adult hypothyroidism 11/12/2014  . Sleep apnea 11/12/2014  . Diabetes mellitus, type 2 ([place]) 11/12/2014  . Degenerative disc disease, lumbar 09/24/2012  . Furunculosis 04/23/2012   Phillips Grout PT, DPT, GCS  Rontavious Albright 11/20/2019, 10:05 AM  [place] MAIN [facility] SERVICES [address] [place], [place], [address] Phone: [phone]   Fax:  [phone]  Name: Patrick Mcgee MRN: [id number] Date of Birth: 1946-06-11

## 2019-11-24 ENCOUNTER — Ambulatory Visit: Payer: Medicare Other

## 2019-11-24 ENCOUNTER — Telehealth: Payer: Self-pay

## 2019-11-24 NOTE — Telephone Encounter (Signed)
Copied from [place] [id number]. Topic: General - Other >> Nov 19, 2019 11:26 AM Hinda Lenis D wrote: Reason for CRM: Dawn A. From [facility] is calling about a fax she send over 3 times already  [phone] / Form was fax again 10 minutes ago please note when receive

## 2019-11-27 ENCOUNTER — Telehealth: Payer: Self-pay

## 2019-11-27 ENCOUNTER — Ambulatory Visit: Payer: Medicare Other

## 2019-11-27 NOTE — Telephone Encounter (Signed)
Telephone call to schedule palliative care visit.  Patient in agreement with RN makin ghome visit 12/05/19 at 2:00 PM.

## 2019-11-28 NOTE — Telephone Encounter (Signed)
Form is being refaxed.

## 2019-12-02 ENCOUNTER — Ambulatory Visit: Payer: Medicare Other

## 2019-12-03 ENCOUNTER — Other Ambulatory Visit: Payer: Self-pay

## 2019-12-03 DIAGNOSIS — E119 Type 2 diabetes mellitus without complications: Secondary | ICD-10-CM

## 2019-12-03 MED ORDER — EMPAGLIFLOZIN 10 MG PO TABS: 10.0000 mg | ORAL_TABLET | Freq: Every day | ORAL | 2 refills | Status: DC

## 2019-12-05 ENCOUNTER — Other Ambulatory Visit: Payer: Medicare Other

## 2019-12-05 ENCOUNTER — Other Ambulatory Visit: Payer: Self-pay

## 2019-12-05 DIAGNOSIS — Z515 Encounter for palliative care: Secondary | ICD-10-CM | POA: Diagnosis not present

## 2019-12-05 NOTE — Progress Notes (Signed)
PATIENT NAME: Patrick Mcgee DOB: 06/15/1946 MRN: [id number]  PRIMARY CARE PROVIDER: Jerrol Mcgee., MD  RESPONSIBLE PARTY:  Acct ID - Guarantor Home Phone Work Phone Relationship Acct Type  [id number] Patrick Mcgee* [phone]  Self P/F     [place], [place], [place] [address]    PLAN OF CARE and INTERVENTIONS:               1.  GOALS OF CARE/ ADVANCE CARE PLANNING:  Remain at hoe with wife and remain independent.               2.  PATIENT/CAREGIVER EDUCATION:  Education on fall precautions, education on s/s of infection, reviewed meds, support               3.  DISEASE STATUS:RN made scheduled palliative care home visit. Nurse met with patient and wife Patrick Mcgee. Patient reports his CT scan showed no evidence of cancer. Patient no longer receiving treatments. Patients wife reports Dr Patrick Mcgee did not use the word remission or cancer free but nothing showed on CT scan. Patient reports he will have another scan in July. Patients current weight 157 lbs. Patient denies pain at the present time. 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 Wife reports patients gait is unsteady and patient suffered a fall a week and a half ago. Patient has large bruise on left hip. Patient is independent with all ADL's.  Wife reports patient continues to sleep most of the time and some days my sleep up to 18 hours per day. Patient states he loves to sleep. Education to wife that chemo could still be affecting patient.  Patient reports he eats 1 good meal a day and snacks throughout the day. Patients breath sounds are clear. Patient denies having any shortness of breath or cough. Patient has brace for right foot and he states he has not been wearing brace like he should. Patient has no edema in his lower extremities. Patients vital signs are stable. Nurse reviewed patient's medications with patient and wife. Patient and wife in agreement with being transferred to NP as patient is stable and has no  unmanaged symptoms at the present time. Patient and wife verbalize appreciation for care received from monthly visits from palliative care team. Patient and wife encouraged to contact palliative care with questions or concerns.      HISTORY OF PRESENT ILLNESS:  Patient is a 74 year old male who resides in home with his wife.  Patient no longer receiving chemo and has no unmanaged symptoms at the present time.  Patient will be transferred to NP.      CODE STATUS: DNR  ADVANCED DIRECTIVES: Yes MOST FORM: No PPS: 60%   PHYSICAL EXAM:   VITALS: Today's Vitals   12/05/19 1500  BP: 122/76  Pulse: 76  Resp: 18  Temp: 97.7 F (36.5 C)  TempSrc: Temporal  SpO2: 95%  Weight: 157 lb (71.2 kg)  PainSc: 0-No pain    LUNGS: clear to auscultation  CARDIAC: Cor RRR  EXTREMITIES: none edema SKIN: Skin color, texture, turgor normal. No rashes or lesions  NEURO: positive for gait problems       Patrick Simmer, RN

## 2019-12-11 ENCOUNTER — Ambulatory Visit: Payer: Self-pay | Admitting: Family Medicine

## 2019-12-16 ENCOUNTER — Ambulatory Visit (INDEPENDENT_AMBULATORY_CARE_PROVIDER_SITE_OTHER): Payer: Medicare Other | Admitting: Family Medicine

## 2019-12-16 ENCOUNTER — Other Ambulatory Visit: Payer: Self-pay

## 2019-12-16 ENCOUNTER — Encounter: Payer: Self-pay | Admitting: Family Medicine

## 2019-12-16 VITALS — BP 106/70 | HR 74 | Temp 96.6°F | Resp 18 | Ht 68.0 in | Wt 158.0 lb

## 2019-12-16 DIAGNOSIS — T451X5A Adverse effect of antineoplastic and immunosuppressive drugs, initial encounter: Secondary | ICD-10-CM | POA: Diagnosis not present

## 2019-12-16 DIAGNOSIS — I25119 Atherosclerotic heart disease of native coronary artery with unspecified angina pectoris: Secondary | ICD-10-CM | POA: Diagnosis not present

## 2019-12-16 DIAGNOSIS — I10 Essential (primary) hypertension: Secondary | ICD-10-CM | POA: Diagnosis not present

## 2019-12-16 DIAGNOSIS — C689 Malignant neoplasm of urinary organ, unspecified: Secondary | ICD-10-CM

## 2019-12-16 DIAGNOSIS — E538 Deficiency of other specified B group vitamins: Secondary | ICD-10-CM | POA: Diagnosis not present

## 2019-12-16 DIAGNOSIS — I2510 Atherosclerotic heart disease of native coronary artery without angina pectoris: Secondary | ICD-10-CM | POA: Diagnosis not present

## 2019-12-16 DIAGNOSIS — D701 Agranulocytosis secondary to cancer chemotherapy: Secondary | ICD-10-CM

## 2019-12-16 DIAGNOSIS — E785 Hyperlipidemia, unspecified: Secondary | ICD-10-CM

## 2019-12-16 DIAGNOSIS — G4733 Obstructive sleep apnea (adult) (pediatric): Secondary | ICD-10-CM | POA: Diagnosis not present

## 2019-12-16 DIAGNOSIS — E039 Hypothyroidism, unspecified: Secondary | ICD-10-CM | POA: Diagnosis not present

## 2019-12-16 DIAGNOSIS — E119 Type 2 diabetes mellitus without complications: Secondary | ICD-10-CM | POA: Diagnosis not present

## 2019-12-16 MED ORDER — GABAPENTIN 300 MG PO CAPS: 600.0000 mg | ORAL_CAPSULE | Freq: Every day | ORAL | 0 refills | Status: DC

## 2019-12-16 NOTE — Progress Notes (Signed)
Established patient visit  I,April Miller,acting as a scribe for Wilhemena Durie, MD.,have documented all relevant documentation on the behalf of Wilhemena Durie, MD,as directed by  Wilhemena Durie, MD while in the presence of Wilhemena Durie, MD.   Patient: Patrick Mcgee   DOB: [birth year]   73 y.o. Male  MRN: [id number] Visit Date: 12/16/2019  Today's healthcare provider: Wilhemena Durie, MD   Chief Complaint  Patient presents with  . Follow-up  . Diabetes  . Hypertension   Subjective    HPI  Patient is doing well from his cancer and last CT scan was negative for any malignancy present.  He is taking a break from chemotherapy.  He is actually feeling fairly well except for the neuropathy. All risk factors for his CAD are treated.  He is asymptomatic for heart disease. Diabetes Mellitus Type II, follow-up  Lab Results  Component Value Date   HGBA1C 8.2 (A) 08/13/2019   HGBA1C 7.8 (A) 03/24/2019   HGBA1C 7.7 (A) 12/18/2018   Last seen for diabetes 4 months ago.  Management since then includes continuing the same treatment. He reports good compliance with treatment. He is not having side effects. none  Home blood sugar records: fasting range: 100  Episodes of hypoglycemia? No none   Current insulin regiment: n/a Most Recent Eye Exam: 04/02/2019  --------------------------------------------------------------------  Hypertension, follow-up  BP Readings from Last 3 Encounters:  12/16/19 106/70  12/05/19 122/76  11/13/19 (!) 145/90   Wt Readings from Last 3 Encounters:  12/16/19 158 lb (71.7 kg)  12/05/19 157 lb (71.2 kg)  11/04/19 160 lb 1.6 oz (72.6 kg)     He was last seen for hypertension 4 months ago.  BP at that visit was 94/64. Management since that visit includes; Decreased losartan 100 mg daily to 50 mg daily. Blood pressures better as he has lost weight.  He reports good compliance with treatment. He is not having side effects.  none He is exercising. He is adherent to low salt diet.   Outside blood pressures are checks only prn.  He does not smoke.  Use of agents associated with hypertension: none.   -------------------------------------------------------------------- Results of the Epworth flowsheet 12/16/2019  Sitting and reading 3  Watching TV 3  Sitting, inactive in a public place (e.g. a theatre or a meeting) 1  As a passenger in a car for an hour without a break 1  Lying down to rest in the afternoon when circumstances permit 3  Sitting and talking to someone 0  Sitting quietly after a lunch without alcohol 2  In a car, while stopped for a few minutes in traffic 0  Total score 13        Medications: Outpatient Medications Prior to Visit  Medication Sig  . aspirin EC 81 MG tablet Take 81 mg by mouth every evening.   Marland Kitchen atorvastatin (LIPITOR) 40 MG tablet TAKE 1 TABLET BY MOUTH AT BEDTIME  . Cholecalciferol (VITAMIN D3) 75 MCG (3000 UT) TABS Take by mouth.  . clobetasol cream (TEMOVATE) 9.93 % Apply 1 application topically as needed (for skin rash).  . empagliflozin (JARDIANCE) 10 MG TABS tablet Take 1 tablet (10 mg total) by mouth daily.  Marland Kitchen gabapentin (NEURONTIN) 300 MG capsule TAKE 1 CAPSULE BY MOUTH THREE TIMES DAILY  . glucose blood (CONTOUR NEXT TEST) test strip Check blood sugars 3 times daily.  . Ivermectin (SOOLANTRA) 1 % CREA Apply 1 application topically daily as  needed (rosacea). To face  . lidocaine-prilocaine (EMLA) cream Apply 1 application topically as needed (port access).  Marland Kitchen losartan (COZAAR) 50 MG tablet Take 1 tablet (50 mg total) by mouth daily.  . magnesium hydroxide (MILK OF MAGNESIA) 400 MG/5ML suspension Take 15 mLs by mouth daily as needed for mild constipation.  . metFORMIN (GLUCOPHAGE) 1000 MG tablet TAKE 1 TABLET BY MOUTH TWICE DAILY WITH MEALS  . nystatin cream (MYCOSTATIN) Apply topically 2 (two) times daily.  Marland Kitchen OVER THE COUNTER MEDICATION Apply 1 application topically  daily as needed (pain). Outback topical pain relief  . oxybutynin (DITROPAN) 5 MG tablet Take 1 tablet by mouth 1 day or 1 dose.  . sertraline (ZOLOFT) 100 MG tablet Take 1 tablet (100 mg total) by mouth daily.  . vitamin B-12 (CYANOCOBALAMIN) 1000 MCG tablet Take by mouth daily. Unsure dose  . vitamin C (ASCORBIC ACID) 500 MG tablet Take 1,000-1,500 mg by mouth daily as needed (immune support).  Marland Kitchen VITAMIN D PO Take 1 tablet by mouth 1 day or 1 dose.  . fluticasone (FLONASE) 50 MCG/ACT nasal spray Use 2 spray(s) in each nostril once daily (Patient not taking: Reported on 11/04/2019)  . HYDROcodone-acetaminophen (NORCO/VICODIN) 5-325 MG tablet Take 1 tablet by mouth 1 day or 1 dose.  . hydrOXYzine (ATARAX/VISTARIL) 25 MG tablet Take 1 tablet (25 mg total) by mouth every 8 (eight) hours as needed for itching. (Patient not taking: Reported on 11/04/2019)  . loratadine (CLARITIN) 10 MG tablet Take 10 mg by mouth daily as needed for allergies. [employer] brand allergy relief  . magic mouthwash w/lidocaine SOLN Take 5 mLs by mouth 4 (four) times daily as needed for mouth pain. (Patient not taking: Reported on 12/16/2019)  . OLANZapine (ZYPREXA) 10 MG tablet Take 1 tablet (10 mg total) by mouth at bedtime. (Patient not taking: Reported on 10/14/2019)   Facility-Administered Medications Prior to Visit  Medication Dose Route Frequency Provider  . Influenza vac split quadrivalent PF (FLUZONE HIGH-DOSE) injection 0.5 mL  0.5 mL Intramuscular Once Karen Kitchens, NP    Review of Systems  Constitutional: Negative for appetite change, chills and fever.  HENT: Negative.   Eyes: Negative.   Respiratory: Negative for chest tightness, shortness of breath and wheezing.   Cardiovascular: Negative for chest pain and palpitations.  Gastrointestinal: Negative for abdominal pain, nausea and vomiting.  Endocrine: Negative.   Genitourinary: Negative.   Allergic/Immunologic: Negative.   Neurological: Positive for numbness.        Chronic foot drop.  Hematological: Negative.   Psychiatric/Behavioral: Negative.     Last hemoglobin A1c Lab Results  Component Value Date   HGBA1C 8.2 (A) 08/13/2019      Objective    BP 106/70 (BP Location: Left Arm, Patient Position: Sitting, Cuff Size: Large)   Pulse 74   Temp (!) 96.6 F (35.9 C) (Other (Comment))   Resp 18   Ht 5\' 8"  (1.727 m)   Wt 158 lb (71.7 kg)   SpO2 95%   BMI 24.02 kg/m  BP Readings from Last 3 Encounters:  12/16/19 106/70  12/05/19 122/76  11/13/19 (!) 145/90   Wt Readings from Last 3 Encounters:  12/16/19 158 lb (71.7 kg)  12/05/19 157 lb (71.2 kg)  11/04/19 160 lb 1.6 oz (72.6 kg)      Physical Exam Vitals reviewed.  HENT:     Head: Normocephalic and atraumatic.     Right Ear: External ear normal.     Left Ear: External  ear normal.  Eyes:     General: No scleral icterus.    Conjunctiva/sclera: Conjunctivae normal.  Cardiovascular:     Rate and Rhythm: Normal rate and regular rhythm.  Pulmonary:     Breath sounds: Normal breath sounds.  Abdominal:     Palpations: Abdomen is soft.  Lymphadenopathy:     Cervical: No cervical adenopathy.  Skin:    General: Skin is warm and dry.  Neurological:     General: No focal deficit present.     Mental Status: He is alert and oriented to person, place, and time.  Psychiatric:        Mood and Affect: Mood normal.        Behavior: Behavior normal.        Thought Content: Thought content normal.        Judgment: Judgment normal.       No results found for any visits on 12/16/19.  Assessment & Plan     1. Type 2 diabetes mellitus without complication, without long-term current use of insulin (HCC) Obtain A1c.  Patient wishes to get a Decon G6 glucometer for continuous readings.  I am fine with this but I do not think you will qualify through Medicare.  He is on Jardiance and Metformin for his diabetes. - TSH - Lipid panel - Hemoglobin A1C  2. Essential hypertension Good  control on losartan.  He has any orthostatic symptoms may consider cutting dose in half. - TSH - Lipid panel - Hemoglobin A1C  3. Hyperlipidemia, unspecified hyperlipidemia type On atorvastatin - TSH - Lipid panel - Hemoglobin A1C  4. Adult hypothyroidism  - TSH - Lipid panel - Hemoglobin A1C  5. 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  Change gabapentin to both tablets nightly to see if he has less sleepiness during the day. - gabapentin (NEURONTIN) 300 MG capsule; Take 2 capsules (600 mg total) by mouth at bedtime.  Dispense: 180 capsule; Refill: 0  6. Atherosclerosis of native coronary artery of native heart without angina pectoris All risk factors treated  7. Obstructive sleep apnea syndrome May repeat sleep study as patient may no longer have sleep apnea with his significant weight loss.  He likes his weight as it is and feels well.  8. 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   No follow-ups on file.      I, Wilhemena Durie, MD, have reviewed all documentation for this visit. The documentation on 12/18/19 for the exam, diagnosis, procedures, and orders are all accurate and complete.    Zurich Carreno Cranford Mon, MD  [facility] [phone] (phone) [phone] (fax)  [place]

## 2019-12-25 ENCOUNTER — Telehealth: Payer: Self-pay

## 2019-12-25 DIAGNOSIS — E119 Type 2 diabetes mellitus without complications: Secondary | ICD-10-CM | POA: Diagnosis not present

## 2019-12-25 DIAGNOSIS — E785 Hyperlipidemia, unspecified: Secondary | ICD-10-CM | POA: Diagnosis not present

## 2019-12-25 DIAGNOSIS — I10 Essential (primary) hypertension: Secondary | ICD-10-CM | POA: Diagnosis not present

## 2019-12-25 DIAGNOSIS — E039 Hypothyroidism, unspecified: Secondary | ICD-10-CM | POA: Diagnosis not present

## 2019-12-25 NOTE — Telephone Encounter (Signed)
Copied from [place] [phone] [id number]. Topic: General - Inquiry >> Dec 25, 2019  8:57 AM Scherrie Gerlach wrote: Reason for CRM: pt states he has been taking the gabapentin just at night as the dr had recommended, but pt reports still sleeping a lot, 16-17 hrs a day.  Pt states he discussed with Dr Rosanna Randy at last visit. Pt wants to know if dr thinks it may be his zoloft causing him to sleep a lot. (he thinks it is) Pt would like to know if OK to cut in half and take 50 mg zoloft instead

## 2019-12-25 NOTE — Telephone Encounter (Signed)
Okay to try cutting Zoloft in half.  Thanks

## 2019-12-25 NOTE — Telephone Encounter (Signed)
Patient advised.

## 2019-12-26 LAB — LIPID PANEL
Chol/HDL Ratio: 2.4 ratio (ref 0.0–5.0)
Cholesterol, Total: 120 mg/dL (ref 100–199)
HDL: 51 mg/dL (ref >39)
LDL Chol Calc (NIH): 54 mg/dL (ref 0–99)
Triglycerides: 73 mg/dL (ref 0–149)
VLDL Cholesterol Cal: 15 mg/dL (ref 5–40)

## 2019-12-26 LAB — TSH: TSH: 5.91 u[IU]/mL — ABNORMAL HIGH (ref 0.450–4.500)

## 2019-12-26 LAB — HEMOGLOBIN A1C
Est. average glucose Bld gHb Est-mCnc: 180 mg/dL
Hgb A1c MFr Bld: 7.9 % — ABNORMAL HIGH (ref 4.8–5.6)

## 2019-12-31 ENCOUNTER — Telehealth: Payer: Self-pay

## 2019-12-31 NOTE — Telephone Encounter (Signed)
Patient advised of lab results

## 2019-12-31 NOTE — Telephone Encounter (Signed)
-----   Message from Jerrol Banana., MD sent at 12/30/2019  8:55 AM EDT ----- Labs stable.  No change in present meds.

## 2020-01-04 ENCOUNTER — Other Ambulatory Visit: Payer: Self-pay | Admitting: Family Medicine

## 2020-01-04 NOTE — Telephone Encounter (Signed)
Requested Prescriptions  Pending Prescriptions Disp Refills  . CONTOUR NEXT TEST test strip [Pharmacy Med Name: Contour Next Test In Vitro Strip] 200 each 0    Sig: USE 1 STRIP TO [place] DAILY     Endocrinology: Diabetes - Testing Supplies Passed - 01/04/2020  6:30 AM      Passed - Valid encounter within last 12 months    Recent Outpatient Visits          2 weeks ago Type 2 diabetes mellitus without complication, without long-term current use of insulin [facility])   [facility] Jerrol Banana., MD   4 months ago Type 2 diabetes mellitus without complication, without long-term current use of insulin [facility])   [facility] Jerrol Banana., MD   8 months ago Essential hypertension   [facility] Jerrol Banana., MD   9 months ago Type 2 diabetes mellitus without complication, without long-term current use of insulin [facility])   [facility] Jerrol Banana., MD   1 year ago Type 2 diabetes mellitus without complication, without long-term current use of insulin [facility])   [facility] Jerrol Banana., MD      Future Appointments            In 3 months Jerrol Banana., MD [facility]

## 2020-01-05 MED ORDER — CONTOUR NEXT TEST VI STRP: ORAL_STRIP | 3 refills | Status: DC

## 2020-01-05 NOTE — Addendum Note (Signed)
Addended by: Dimple Nanas on: 01/05/2020 09:37 AM   Modules accepted: Orders

## 2020-01-08 ENCOUNTER — Other Ambulatory Visit: Payer: Self-pay | Admitting: Family Medicine

## 2020-01-08 DIAGNOSIS — F32A Depression, unspecified: Secondary | ICD-10-CM

## 2020-01-14 ENCOUNTER — Other Ambulatory Visit: Payer: Self-pay

## 2020-01-14 DIAGNOSIS — E119 Type 2 diabetes mellitus without complications: Secondary | ICD-10-CM

## 2020-01-14 MED ORDER — EMPAGLIFLOZIN 10 MG PO TABS: 10.0000 mg | ORAL_TABLET | Freq: Every day | ORAL | 2 refills | Status: DC

## 2020-01-14 MED ORDER — CONTOUR NEXT TEST VI STRP: ORAL_STRIP | 3 refills | Status: DC

## 2020-01-19 ENCOUNTER — Telehealth: Payer: Self-pay | Admitting: Family Medicine

## 2020-01-19 ENCOUNTER — Other Ambulatory Visit: Payer: Self-pay

## 2020-01-19 DIAGNOSIS — E119 Type 2 diabetes mellitus without complications: Secondary | ICD-10-CM

## 2020-01-19 MED ORDER — CONTOUR NEXT TEST VI STRP: 1.0000 | ORAL_STRIP | Freq: Two times a day (BID) | 3 refills | Status: DC

## 2020-01-19 NOTE — Telephone Encounter (Signed)
Pt has been out of his test strips for over a week, preferred pharmacy. Pt is requesting for PCP to revise instructions to twice a day instead of 4 times a day. [place] [address] [place]  [place] [address]  Phone: [phone] Fax: [phone]

## 2020-01-21 ENCOUNTER — Other Ambulatory Visit: Payer: Self-pay | Admitting: Oncology

## 2020-01-21 ENCOUNTER — Other Ambulatory Visit: Payer: Self-pay

## 2020-01-21 ENCOUNTER — Ambulatory Visit
Admission: RE | Admit: 2020-01-21 | Discharge: 2020-01-21 | Disposition: A | Discharge status: Home / Self Care | Payer: Medicare Other | Source: Ambulatory Visit | Attending: Oncology | Admitting: Oncology

## 2020-01-21 DIAGNOSIS — C642 Malignant neoplasm of left kidney, except renal pelvis: Secondary | ICD-10-CM

## 2020-01-21 DIAGNOSIS — C679 Malignant neoplasm of bladder, unspecified: Secondary | ICD-10-CM | POA: Diagnosis not present

## 2020-01-21 DIAGNOSIS — N200 Calculus of kidney: Secondary | ICD-10-CM | POA: Diagnosis not present

## 2020-01-21 LAB — POCT I-STAT CREATININE: Creatinine, Ser: 0.8 mg/dL (ref 0.61–1.24)

## 2020-01-21 MED ORDER — IOHEXOL 300 MG/ML  SOLN
100.0000 mL | Freq: Once | INTRAMUSCULAR | Status: AC | PRN
  Administered 2020-01-21: 100 mL via INTRAVENOUS

## 2020-01-22 ENCOUNTER — Inpatient Hospital Stay (HOSPITAL_BASED_OUTPATIENT_CLINIC_OR_DEPARTMENT_OTHER): Payer: Medicare Other | Admitting: Oncology

## 2020-01-22 ENCOUNTER — Inpatient Hospital Stay: Payer: Medicare Other | Attending: Oncology

## 2020-01-22 ENCOUNTER — Other Ambulatory Visit: Payer: Self-pay

## 2020-01-22 ENCOUNTER — Encounter: Payer: Self-pay | Admitting: Oncology

## 2020-01-22 ENCOUNTER — Inpatient Hospital Stay: Payer: Medicare Other | Admitting: Hospice and Palliative Medicine

## 2020-01-22 ENCOUNTER — Ambulatory Visit: Payer: Medicare Other | Admitting: Oncology

## 2020-01-22 ENCOUNTER — Telehealth: Payer: Self-pay

## 2020-01-22 ENCOUNTER — Other Ambulatory Visit: Payer: Medicare Other

## 2020-01-22 ENCOUNTER — Encounter: Payer: Medicare Other | Admitting: Hospice and Palliative Medicine

## 2020-01-22 VITALS — BP 98/76 | HR 73 | Temp 97.9°F | Resp 16 | Ht 68.0 in | Wt 155.0 lb

## 2020-01-22 DIAGNOSIS — C778 Secondary and unspecified malignant neoplasm of lymph nodes of multiple regions: Secondary | ICD-10-CM | POA: Diagnosis not present

## 2020-01-22 DIAGNOSIS — Z7984 Long term (current) use of oral hypoglycemic drugs: Secondary | ICD-10-CM | POA: Insufficient documentation

## 2020-01-22 DIAGNOSIS — R7989 Other specified abnormal findings of blood chemistry: Secondary | ICD-10-CM

## 2020-01-22 DIAGNOSIS — E1140 Type 2 diabetes mellitus with diabetic neuropathy, unspecified: Secondary | ICD-10-CM | POA: Insufficient documentation

## 2020-01-22 DIAGNOSIS — Z87891 Personal history of nicotine dependence: Secondary | ICD-10-CM | POA: Diagnosis not present

## 2020-01-22 DIAGNOSIS — E538 Deficiency of other specified B group vitamins: Secondary | ICD-10-CM | POA: Insufficient documentation

## 2020-01-22 DIAGNOSIS — Z9221 Personal history of antineoplastic chemotherapy: Secondary | ICD-10-CM | POA: Insufficient documentation

## 2020-01-22 DIAGNOSIS — Z95828 Presence of other vascular implants and grafts: Secondary | ICD-10-CM

## 2020-01-22 DIAGNOSIS — J849 Interstitial pulmonary disease, unspecified: Secondary | ICD-10-CM | POA: Insufficient documentation

## 2020-01-22 DIAGNOSIS — C642 Malignant neoplasm of left kidney, except renal pelvis: Secondary | ICD-10-CM

## 2020-01-22 DIAGNOSIS — Z7189 Other specified counseling: Secondary | ICD-10-CM | POA: Diagnosis not present

## 2020-01-22 DIAGNOSIS — I2510 Atherosclerotic heart disease of native coronary artery without angina pectoris: Secondary | ICD-10-CM | POA: Diagnosis not present

## 2020-01-22 DIAGNOSIS — I25119 Atherosclerotic heart disease of native coronary artery with unspecified angina pectoris: Secondary | ICD-10-CM

## 2020-01-22 DIAGNOSIS — Z79899 Other long term (current) drug therapy: Secondary | ICD-10-CM | POA: Insufficient documentation

## 2020-01-22 DIAGNOSIS — D6481 Anemia due to antineoplastic chemotherapy: Secondary | ICD-10-CM | POA: Diagnosis not present

## 2020-01-22 LAB — TSH: TSH: 4.424 u[IU]/mL (ref 0.350–4.500)

## 2020-01-22 LAB — CBC WITH DIFFERENTIAL/PLATELET
Abs Immature Granulocytes: 0.02 10*3/uL (ref 0.00–0.07)
Basophils Absolute: 0.1 10*3/uL (ref 0.0–0.1)
Basophils Relative: 1 %
Eosinophils Absolute: 0.6 10*3/uL — ABNORMAL HIGH (ref 0.0–0.5)
Eosinophils Relative: 8 %
HCT: 38.7 % — ABNORMAL LOW (ref 39.0–52.0)
Hemoglobin: 13.3 g/dL (ref 13.0–17.0)
Immature Granulocytes: 0 %
Lymphocytes Relative: 26 %
Lymphs Abs: 1.9 10*3/uL (ref 0.7–4.0)
MCH: 29.7 pg (ref 26.0–34.0)
MCHC: 34.4 g/dL (ref 30.0–36.0)
MCV: 86.4 fL (ref 80.0–100.0)
Monocytes Absolute: 0.7 10*3/uL (ref 0.1–1.0)
Monocytes Relative: 9 %
Neutro Abs: 4.1 10*3/uL (ref 1.7–7.7)
Neutrophils Relative %: 56 %
Platelets: 209 10*3/uL (ref 150–400)
RBC: 4.48 MIL/uL (ref 4.22–5.81)
RDW: 13.5 % (ref 11.5–15.5)
WBC: 7.3 10*3/uL (ref 4.0–10.5)
nRBC: 0 % (ref 0.0–0.2)

## 2020-01-22 LAB — COMPREHENSIVE METABOLIC PANEL
ALT: 14 U/L (ref 0–44)
AST: 18 U/L (ref 15–41)
Albumin: 4.2 g/dL (ref 3.5–5.0)
Alkaline Phosphatase: 80 U/L (ref 38–126)
Anion gap: 11 (ref 5–15)
BUN: 18 mg/dL (ref 8–23)
CO2: 26 mmol/L (ref 22–32)
Calcium: 9.8 mg/dL (ref 8.9–10.3)
Chloride: 101 mmol/L (ref 98–111)
Creatinine, Ser: 0.73 mg/dL (ref 0.61–1.24)
GFR calc Af Amer: >60 mL/min (ref >60)
GFR calc non Af Amer: >60 mL/min (ref >60)
Glucose, Bld: 148 mg/dL — ABNORMAL HIGH (ref 70–99)
Potassium: 3.7 mmol/L (ref 3.5–5.1)
Sodium: 138 mmol/L (ref 135–145)
Total Bilirubin: 1.2 mg/dL (ref 0.3–1.2)
Total Protein: 7.5 g/dL (ref 6.5–8.1)

## 2020-01-22 MED ORDER — HEPARIN SOD (PORK) LOCK FLUSH 100 UNIT/ML IV SOLN
500.0000 [IU] | Freq: Once | INTRAVENOUS | Status: AC
  Administered 2020-01-22: 500 [IU] via INTRAVENOUS
  Filled 2020-01-22: qty 5

## 2020-01-22 MED ORDER — SODIUM CHLORIDE 0.9% FLUSH
10.0000 mL | INTRAVENOUS | Status: DC | PRN
  Administered 2020-01-22: 10 mL via INTRAVENOUS
  Filled 2020-01-22: qty 10

## 2020-01-22 NOTE — Telephone Encounter (Signed)
-----   Message from Sindy Guadeloupe, MD sent at 01/22/2020  2:14 PM EDT ----- Please let patient know tsh is normal. Does not need thyroid supplements

## 2020-01-22 NOTE — Progress Notes (Signed)
Pt having a lot of weakness and tired. Sleeps a lot- if he sits down he will fall asleep. Snacks mostly- eating fruits, cereal and coffee. He drinks one premier drink a day. Wife concerned about tsh elevated from PCP and is that making him sleep so much. He states that he has good BM mostly every day

## 2020-01-22 NOTE — Telephone Encounter (Signed)
Spoke with patient and let him and let him know his result. [facility]

## 2020-01-22 NOTE — Progress Notes (Signed)
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 [name] so far for his metastatic urothelial carcinoma. This was originally diagnosed in May 2019. He was noted to have a filling defect in the lower pole collecting system of the left kidney along with para-aortic and retroperitoneal adenopathy concerning for metastatic disease. He underwent left ureteroscopy and renal pelvis biopsy which revealed small fragments of high-grade urothelial carcinoma with small focus of invasion. He was started on carboplatin and gemcitabine chemotherapy in June 2019 and was continued on 05/27/2018 for 8 cycles. He tolerated chemotherapy well except for chemo-induced anemia for which she has been getting Procrit every 2 weeks. He did have response to his disease based on scans in August 2019. However repeat scan on 05/31/2018 showed increase in the size of the primary tumor from 1.2 to 1.9 cm and increase in left para-aortic adenopathy from 0.9 to 1.3 cm. Periportal adenopathy was stable at 1.4 cm. Second line immunotherapy was recommended. His initial biopsy specimen did not have enough sample to undergo FGFR mutation testing. He also has mild interstitial lung disease for which he sees pulmonary but he  is not on home oxygen. He has B12 deficiency for which he is on oral B12. Also has diabetes and coronary artery disease.Tecentriq started on 06/17/2018.Patient noted to have disease progression in his lymph nodes in May 2020. Repeat biopsy showed metastatic urothelial carcinoma which did not have aFGFR mutation. He has been started on third line Padcev  Treatment on hold since April 2021 due to neuropathy and to maintain quality of life  Interval history-patient reports feeling more fatigued.  He mostly eats small snacks instead of full meals and rests a lot in between his meals.Neuropathy continues to bother him and it is no better or no worse.  Weight has gone down by 5 pounds in the last 3 months  ECOG PS- 1 Pain scale- 0   Review of systems- Review of Systems  Constitutional: Positive for malaise/fatigue and weight loss. Negative for chills and fever.  HENT: Negative for congestion, ear discharge and nosebleeds.   Eyes: Negative for blurred vision.  Respiratory: Negative for cough, hemoptysis, sputum production, shortness of breath and wheezing.   Cardiovascular: Negative for chest pain, palpitations, orthopnea and claudication.  Gastrointestinal: Negative for abdominal pain, blood in stool, constipation, diarrhea, heartburn, melena, nausea and vomiting.  Genitourinary: Negative for dysuria, flank pain, frequency, hematuria and urgency.  Musculoskeletal: Negative for back pain, joint pain and myalgias.  Skin: Negative for rash.  Neurological: Negative for dizziness, tingling, focal weakness, seizures, weakness and headaches.  Endo/Heme/Allergies: Does not bruise/bleed easily.  Psychiatric/Behavioral: Negative for depression and suicidal ideas. The patient does not have insomnia.      Allergies  Allergen Reactions  . Sulfa Antibiotics Other (See Comments)    Joint pain  . Ace Inhibitors Cough  . Invokana [Canagliflozin] Other (See Comments)  Leg pain  . Penicillins Rash      Did it involve swelling of the face/tongue/throat, SOB, or low BP? No Did it involve sudden or severe rash/hives, skin peeling, or any reaction on the inside of your mouth or nose? Yes Did you need to seek medical attention at a hospital or doctor's office? Yes When did it last happen?childhood allergy If all above answers are "NO", may proceed with cephalosporin use.      Past Medical History:  Diagnosis Date  . Acid reflux   . Anxiety   . Depression   . Diabetes mellitus without complication ([place])   . Hyperlipidemia   . Hypertension   . MRSA (methicillin resistant Staphylococcus aureus) infection 1992   history  . Myocardial infarction ([place]) 1995  . Sleep apnea    CPAP  . Urothelial cancer ([place]) 11/2017   Left Urothelial mass, chemo tx's     Past Surgical History:  Procedure Laterality Date  . CATARACT EXTRACTION Left   . COLONOSCOPY  2010   [facility]  . COLONOSCOPY WITH PROPOFOL N/A 10/04/2016   Procedure: COLONOSCOPY WITH PROPOFOL;  Surgeon: [name], MD;  Location: [facility] ENDOSCOPY;  Service: Endoscopy;  Laterality: N/A;  . [place], 2000, 2001, 2014  . CYSTOSCOPY W/ RETROGRADES Bilateral 12/10/2017   Procedure: CYSTOSCOPY WITH RETROGRADE PYELOGRAM;  Surgeon: [name], MD;  Location: [facility] ORS;  Service: Urology;  Laterality: Bilateral;  . CYSTOSCOPY W/ RETROGRADES Left 12/09/2018   Procedure: CYSTOSCOPY WITH RETROGRADE PYELOGRAM;  Surgeon: [name], MD;  Location: [facility] ORS;  Service: Urology;  Laterality: Left;  . CYSTOSCOPY WITH BIOPSY Left 12/09/2018   Procedure: CYSTOSCOPY WITH URETERAL/RENAL PELVIC BIOPSY;  Surgeon: [name], MD;  Location: [facility] ORS;  Service: Urology;  Laterality: Left;  . CYSTOSCOPY WITH STENT PLACEMENT Left 12/10/2017   Procedure: CYSTOSCOPY WITH STENT PLACEMENT;  Surgeon: [name], MD;  Location: [facility] ORS;  Service: Urology;  Laterality: Left;  . CYSTOSCOPY WITH STENT  PLACEMENT Left 12/09/2018   Procedure: CYSTOSCOPY WITH STENT PLACEMENT;  Surgeon: [name], MD;  Location: [facility] ORS;  Service: Urology;  Laterality: Left;  . CYSTOSCOPY WITH URETEROSCOPY Left 12/09/2018   Procedure: CYSTOSCOPY WITH URETEROSCOPY;  Surgeon: [name], MD;  Location: [facility] ORS;  Service: Urology;  Laterality: Left;  . EYE SURGERY Bilateral    cataract  . INGUINAL HERNIA REPAIR Right 08/09/2015   Procedure: HERNIA REPAIR INGUINAL ADULT;  Surgeon: [name], MD;  Location: [facility] ORS;  Service: General;  Laterality: Right;  . NASAL SINUS SURGERY    . nuclear stress test    . PORTA CATH INSERTION N/A 12/19/2017   Procedure: PORTA CATH INSERTION;  Surgeon: [name], MD;  Location: [place] CV LAB;  Service: Cardiovascular;  Laterality: N/A;  . URETERAL BIOPSY Left 12/10/2017   Procedure: URETERAL & renal PELVIS BIOPSY;  Surgeon: [name], MD;  Location: [facility] ORS;  Service: Urology;  Laterality: Left;  . URETEROSCOPY Left 12/10/2017   Procedure: URETEROSCOPY;  Surgeon: [name], MD;  Location: [facility] ORS;  Service: Urology;  Laterality: Left;    Social History   Socioeconomic History  . Marital status: Married    Spouse name: [name]  . Number of children: 1  . Years of education: Not on file  . Highest education level: Associate degree: occupational, Hotel manager, or vocational program  Occupational History  . Occupation: retired  Tobacco Use  . Smoking status: Former Smoker  Types: Cigars    Quit date: 10/09/1978    Years since quitting: 41.3  . Smokeless tobacco: Former Systems developer    Types: Chew    Quit date: 12/04/1988  . Tobacco comment: on occasion  Vaping Use  . Vaping Use: Never used  Substance and Sexual Activity  . Alcohol use: Not Currently  . Drug use: No  . Sexual activity: Not Currently  Other Topics Concern  . Not on file  Social History Narrative  . Not on file   Social Determinants of Health   Financial Resource Strain:   .  Difficulty of Paying Living Expenses:   Food Insecurity:   . Worried About Charity fundraiser in the Last Year:   . Arboriculturist in the Last Year:   Transportation Needs:   . Film/video editor (Medical):   Marland Kitchen Lack of Transportation (Non-Medical):   Physical Activity:   . Days of Exercise per Week:   . Minutes of Exercise per Session:   Stress: No Stress Concern Present  . Feeling of Stress : Not at all  Social Connections: Unknown  . Frequency of Communication with Friends and Family: Patient refused  . Frequency of Social Gatherings with Friends and Family: Patient refused  . Attends Religious Services: Patient refused  . Active Member of Clubs or Organizations: Patient refused  . Attends Archivist Meetings: Patient refused  . Marital Status: Patient refused  Intimate Partner Violence: Unknown  . Fear of Current or Ex-Partner: Patient refused  . Emotionally Abused: Patient refused  . Physically Abused: Patient refused  . Sexually Abused: Patient refused    Family History  Problem Relation Age of Onset  . Heart disease Mother   . Cancer Father        Lung and colon cancer  . Heart disease Father   . Emphysema Maternal Grandfather   . Tuberculosis Maternal Grandmother      Current Outpatient Medications:  .  aspirin EC 81 MG tablet, Take 81 mg by mouth every evening. , Disp: , Rfl:  .  atorvastatin (LIPITOR) 40 MG tablet, TAKE 1 TABLET BY MOUTH AT BEDTIME, Disp: 90 tablet, Rfl: 4 .  Cholecalciferol (VITAMIN D3) 25 MCG (1000 UT) CAPS, Take 1 capsule by mouth daily., Disp: , Rfl:  .  clobetasol cream (TEMOVATE) 6.60 %, Apply 1 application topically as needed (for skin rash)., Disp: 30 g, Rfl: 1 .  empagliflozin (JARDIANCE) 10 MG TABS tablet, Take 1 tablet (10 mg total) by mouth daily., Disp: 90 tablet, Rfl: 2 .  gabapentin (NEURONTIN) 300 MG capsule, Take 2 capsules (600 mg total) by mouth at bedtime. (Patient taking differently: Take 300 mg by mouth at  bedtime. ), Disp: 180 capsule, Rfl: 0 .  glucose blood (CONTOUR NEXT TEST) test strip, 1 each by Other route in the morning and at bedtime. USE 1 STRIP TO CHECK GLUCOSE THREE TIMES DAILY, Disp: 100 each, Rfl: 3 .  Ivermectin (SOOLANTRA) 1 % CREA, Apply 1 application topically daily as needed (rosacea). To face, Disp: , Rfl:  .  lidocaine-prilocaine (EMLA) cream, Apply 1 application topically as needed (port access)., Disp: 1 g, Rfl: 3 .  losartan (COZAAR) 50 MG tablet, Take 1 tablet (50 mg total) by mouth daily., Disp: 90 tablet, Rfl: 3 .  magnesium hydroxide (MILK OF MAGNESIA) 400 MG/5ML suspension, Take 15 mLs by mouth daily as needed for mild constipation., Disp: , Rfl:  .  metFORMIN (GLUCOPHAGE) 1000 MG tablet, TAKE  1 TABLET BY MOUTH TWICE DAILY WITH MEALS, Disp: 180 tablet, Rfl: 1 .  OVER THE COUNTER MEDICATION, Apply 1 application topically daily as needed (pain). Outback topical pain relief, Disp: , Rfl:  .  sertraline (ZOLOFT) 100 MG tablet, Take 1 tablet by mouth once daily, Disp: 90 tablet, Rfl: 0 .  vitamin B-12 (CYANOCOBALAMIN) 1000 MCG tablet, Take by mouth daily. Unsure dose, Disp: , Rfl:  .  fluticasone (FLONASE) 50 MCG/ACT nasal spray, Use 2 spray(s) in each nostril once daily (Patient not taking: Reported on 11/04/2019), Disp: 48 g, Rfl: 3 .  hydrOXYzine (ATARAX/VISTARIL) 25 MG tablet, Take 1 tablet (25 mg total) by mouth every 8 (eight) hours as needed for itching. (Patient not taking: Reported on 11/04/2019), Disp: 30 tablet, Rfl: 3 .  magic mouthwash w/lidocaine SOLN, Take 5 mLs by mouth 4 (four) times daily as needed for mouth pain. (Patient not taking: Reported on 12/16/2019), Disp: 480 mL, Rfl: 3 .  nystatin cream (MYCOSTATIN), Apply topically 2 (two) times daily. (Patient not taking: Reported on 01/22/2020), Disp: 15 g, Rfl: 1 .  vitamin C (ASCORBIC ACID) 500 MG tablet, Take 1,000-1,500 mg by mouth daily as needed (immune support). (Patient not taking: Reported on 01/22/2020), Disp: ,  Rfl:  No current facility-administered medications for this visit.  Facility-Administered Medications Ordered in Other Visits:  .  Influenza vac split quadrivalent PF (FLUZONE HIGH-DOSE) injection 0.5 mL, 0.5 mL, Intramuscular, Once, Honor Loh E, NP .  sodium chloride flush (NS) 0.9 % injection 10 mL, 10 mL, Intravenous, PRN, Sindy Guadeloupe, MD, 10 mL at 01/22/20 1017  Physical exam:  Vitals:   01/22/20 1114  BP: 98/76  Pulse: 73  Resp: 16  Temp: 97.9 F (36.6 C)  TempSrc: Tympanic  Weight: 155 lb (70.3 kg)  Height: 5\' 8"  (1.727 m)   Physical Exam Constitutional:      General: He is not in acute distress. Pulmonary:     Effort: Pulmonary effort is normal.     Breath sounds: No rhonchi.  Skin:    General: Skin is warm and dry.  Neurological:     Mental Status: He is alert and oriented to person, place, and time.      CMP Latest Ref Rng & Units 01/22/2020  Glucose 70 - 99 mg/dL 148(H)  BUN 8 - 23 mg/dL 18  Creatinine 0.61 - 1.24 mg/dL 0.73  Sodium 135 - 145 mmol/L 138  Potassium 3.5 - 5.1 mmol/L 3.7  Chloride 98 - 111 mmol/L 101  CO2 22 - 32 mmol/L 26  Calcium 8.9 - 10.3 mg/dL 9.8  Total Protein 6.5 - 8.1 g/dL 7.5  Total Bilirubin 0.3 - 1.2 mg/dL 1.2  Alkaline Phos 38 - 126 U/L 80  AST 15 - 41 U/L 18  ALT 0 - 44 U/L 14   CBC Latest Ref Rng & Units 01/22/2020  WBC 4.0 - 10.5 K/uL 7.3  Hemoglobin 13.0 - 17.0 g/dL 13.3  Hematocrit 39 - 52 % 38.7(L)  Platelets 150 - 400 K/uL 209    No images are attached to the encounter.  CT CHEST ABDOMEN PELVIS W CONTRAST  Result Date: 01/21/2020 CLINICAL DATA:  Bladder cancer treated with chemotherapy. Left kidney cancer. EXAM: CT CHEST, ABDOMEN, AND PELVIS WITH CONTRAST TECHNIQUE: Multidetector CT imaging of the chest, abdomen and pelvis was performed following the standard protocol during bolus administration of intravenous contrast. CONTRAST:  121mL OMNIPAQUE IOHEXOL 300 MG/ML  SOLN COMPARISON:  10/24/2019. FINDINGS: CT CHEST  FINDINGS  Cardiovascular: Right IJ Port-A-Cath terminates in the low SVC. Narrowing/occlusion of the right brachiocephalic vein with extensive collateral venous flow. Atherosclerotic calcification of the aorta and coronary arteries. Heart is enlarged. No pericardial effusion. Mediastinum/Nodes: No pathologically enlarged mediastinal, hilar or axillary lymph nodes. There may be mild lower esophageal wall thickening which can be seen with gastroesophageal reflux. Lungs/Pleura: Peripheral subpleural reticulation without a definite zonal predominance, as on the prior exam. No pleural fluid. Airway is unremarkable. Musculoskeletal: Degenerative changes in the spine. No worrisome lytic or sclerotic lesions. CT ABDOMEN PELVIS FINDINGS Hepatobiliary: Liver is unremarkable. A small stone is seen in the gallbladder. No biliary ductal dilatation. Pancreas: Negative. Spleen: Negative. Adrenals/Urinary Tract: Adrenal glands are unremarkable. Tiny stone in the lower pole right kidney. Low-attenuation lesions in the kidneys measure up to 3.8 cm on the right and are likely cysts. There is a soft tissue lesion in the intrarenal collecting system of the lower pole left kidney measuring 1.5 x 2.1 cm (coronal image 86). On delayed imaging, there is decreased attenuation involving the mid and lower poles of the left kidney ureters as on the prior exam. Ureters are decompressed. Bladder is grossly unremarkable. Stomach/Bowel: Small hiatal hernia. Stomach, small bowel and appendix are unremarkable. Stool is seen throughout the colon, indicative of constipation. Vascular/Lymphatic: Atherosclerotic calcification of the aorta without aneurysm. No pathologically enlarged lymph nodes. Reproductive: Prostate is visualized. Other: No free fluid.  Mesenteries and peritoneum are unremarkable. Musculoskeletal: Faint sclerotic lesion in the medial right iliac wing (2/95), unchanged. No worrisome lytic or sclerotic lesions. Degenerative changes in the  spine. IMPRESSION: 1. Soft tissue mass in the intrarenal collecting system of the lower pole left kidney, consistent with recurrent disease. No metastatic adenopathy. 2. Right renal stone. 3. Cholelithiasis. 4. Peripheral subpleural pulmonary parenchymal reticulation can be seen with usual interstitial pneumonitis or nonspecific interstitial pneumonitis. 5. Aortic atherosclerosis (ICD10-I70.0). Coronary artery calcification. Electronically Signed   By: Lorin Picket M.D.   On: 01/21/2020 13:11     Assessment and plan- Patient is a 74 y.o. 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   He is here to discuss CT scan results and further management  I have reviewed CT chest abdomen and pelvis images independently and discussed findings with the patient.  He did not have any detectable disease during his last scans in April 2021.  Recent scans again show the upper urothelial mass has recurred.  There is no distant metastatic disease or pelvic adenopathy noted.  Patient has not had any Padcev since April 2021.  We again discussed following options  1.  Not doing any further scans and pursuing best supportive care/hospice.  Focusing on his quality of life and symptoms alone  2.  Restarting Padcev at a dose we were doing previously 1 week on 3 weeks off and see if his quality of life is preserved with it  3.  Not restarting Padcev but continuing surveillance scans.  Deciding about treatment down the line if he is more symptomatic  At this time I would recommend option 2.  He is thinking about his options and will get back to [place].  Patient does not want to start any appetite stimulating medications at this time.  We also rechecked his TSH which was normal and nothing that that is contributing to his fatigue.  I have encouraged him to try Ensure 2-3 times a day instead of once a day   Follow-up to be decided  Visit Diagnosis 1. Urothelial carcinoma of  kidney, left ([place])   2. Abnormal TSH   3. Goals of care, counseling/discussion      Dr. Randa Evens, MD, MPH [facility] at [facility] [phone] 01/22/2020 2:57 PM

## 2020-01-25 ENCOUNTER — Encounter: Payer: Self-pay | Admitting: Oncology

## 2020-01-26 ENCOUNTER — Inpatient Hospital Stay (HOSPITAL_BASED_OUTPATIENT_CLINIC_OR_DEPARTMENT_OTHER): Payer: Medicare Other | Admitting: Hospice and Palliative Medicine

## 2020-01-26 ENCOUNTER — Encounter: Payer: Self-pay | Admitting: Hospice and Palliative Medicine

## 2020-01-26 DIAGNOSIS — I25119 Atherosclerotic heart disease of native coronary artery with unspecified angina pectoris: Secondary | ICD-10-CM

## 2020-01-26 DIAGNOSIS — C642 Malignant neoplasm of left kidney, except renal pelvis: Secondary | ICD-10-CM | POA: Diagnosis not present

## 2020-01-26 DIAGNOSIS — Z515 Encounter for palliative care: Secondary | ICD-10-CM | POA: Diagnosis not present

## 2020-01-26 NOTE — Progress Notes (Signed)
Virtual Visit via Video Note  I connected with Patrick Mcgee on 01/26/20 at 11:30 AM EDT by a video enabled telemedicine application and verified that I am speaking with the correct person using two identifiers.   I discussed the limitations of evaluation and management by telemedicine and the availability of in person appointments. The patient expressed understanding and agreed to proceed.  History of Present Illness: [name] is a 74 y.o.malewith multiple medical problems including metastatic urothelial carcinoma originally diagnosed in May 2019 status post chemotherapy and immunotherapy. PMH is also notable for mild interstitial lung disease, diabetes, and CAD status post previous stenting.  He stands in April 2021 revealed upper urothelial mass but no distant metastatic disease.  Patient is deciding on whether he wants to pursue further treatment. Patient was referred to palliative care to help address goals and manage ongoing symptoms.   Observations/Objective: Patient reports that he is doing reasonably well.  He denies any severe symptomatic complaints today.  He denies any significant changes or concerns today.  Patient continues to endorse chronic peripheral neuropathy.  He has reduced his dose of gabapentin as he felt it caused him to be drowsy.  He is using a topical cream for that and says it helps.  We discussed possible trial of duloxetine but that would require rotate him from Zoloft.  Patient says that he is considering options as discussed by Dr. Janese Banks last week including best supportive care versus surveillance versus further chemotherapy.  Patient says that he thinks he is leaning away from chemotherapy despite encouragement by his family to pursue more treatment.   He recognizes that his long-term prognosis is poor. Patient cites his strong faith in God as being the ultimate healer in regards to his cancer.   Assessment and Plan: Stage IV urothelial cancer -patient  deciding on treatment options versus best supportive care versus surveillance.  Follow Up Instructions: Follow-up MyChart visit in about a month   I discussed the assessment and treatment plan with the patient. The patient was provided an opportunity to ask questions and all were answered. The patient agreed with the plan and demonstrated an understanding of the instructions.   The patient was advised to call back or seek an in-person evaluation if the symptoms worsen or if the condition fails to improve as anticipated.  I provided 15 minutes of non-face-to-face time during this encounter.   Irean Hong, NP

## 2020-01-26 NOTE — Progress Notes (Signed)
Pt still with weakness and tired. Sleeping a lot. Trying to do better to eat . He was just seen last week by dr Janese Banks. Drinks coffee and mixed protein shake in coffee and usually only drinks 1 a day, encourage last week to drink 2 to 3 a day. Encouraged to drink water in between coffee. Pt still has neuropathy of finger tips on both hands and bil. Feet. Decreased the neurontin due to sleeping all the time to 1 dose a day

## 2020-01-28 ENCOUNTER — Telehealth: Payer: Self-pay | Admitting: Family Medicine

## 2020-01-28 DIAGNOSIS — E119 Type 2 diabetes mellitus without complications: Secondary | ICD-10-CM

## 2020-01-28 NOTE — Telephone Encounter (Signed)
An authorization needs to be done so Patrick Mcgee can test his blood sugar three times a day.  His insurance will only allow him to test it 1 time a day.   [facility] told him his Doctor had to do a prior authorization for more strips.  He has been trying since 01/19/20 to get the strips and cant.

## 2020-01-29 NOTE — Telephone Encounter (Signed)
Please advise? Does patient need to test three times a day?

## 2020-01-29 NOTE — Telephone Encounter (Signed)
If on insulin then yes.

## 2020-01-30 ENCOUNTER — Encounter: Payer: Self-pay | Admitting: Oncology

## 2020-01-30 MED ORDER — CONTOUR NEXT TEST VI STRP: 1.0000 | ORAL_STRIP | Freq: Two times a day (BID) | 3 refills | Status: DC

## 2020-02-03 ENCOUNTER — Ambulatory Visit: Payer: Medicare Other | Admitting: Oncology

## 2020-02-03 ENCOUNTER — Other Ambulatory Visit: Payer: Medicare Other

## 2020-02-10 ENCOUNTER — Inpatient Hospital Stay: Payer: Medicare Other | Attending: Oncology

## 2020-02-10 ENCOUNTER — Inpatient Hospital Stay: Payer: Medicare Other

## 2020-02-10 ENCOUNTER — Other Ambulatory Visit: Payer: Self-pay

## 2020-02-10 VITALS — BP 121/74 | HR 76 | Temp 93.1°F | Resp 20 | Wt 156.0 lb

## 2020-02-10 DIAGNOSIS — Z5111 Encounter for antineoplastic chemotherapy: Secondary | ICD-10-CM

## 2020-02-10 DIAGNOSIS — C642 Malignant neoplasm of left kidney, except renal pelvis: Secondary | ICD-10-CM | POA: Diagnosis not present

## 2020-02-10 DIAGNOSIS — D701 Agranulocytosis secondary to cancer chemotherapy: Secondary | ICD-10-CM

## 2020-02-10 DIAGNOSIS — Z5112 Encounter for antineoplastic immunotherapy: Secondary | ICD-10-CM | POA: Insufficient documentation

## 2020-02-10 LAB — CBC WITH DIFFERENTIAL/PLATELET
Abs Immature Granulocytes: 0.01 10*3/uL (ref 0.00–0.07)
Basophils Absolute: 0.1 10*3/uL (ref 0.0–0.1)
Basophils Relative: 1 %
Eosinophils Absolute: 0.4 10*3/uL (ref 0.0–0.5)
Eosinophils Relative: 6 %
HCT: 39 % (ref 39.0–52.0)
Hemoglobin: 13.2 g/dL (ref 13.0–17.0)
Immature Granulocytes: 0 %
Lymphocytes Relative: 19 %
Lymphs Abs: 1.3 10*3/uL (ref 0.7–4.0)
MCH: 29.7 pg (ref 26.0–34.0)
MCHC: 33.8 g/dL (ref 30.0–36.0)
MCV: 87.8 fL (ref 80.0–100.0)
Monocytes Absolute: 0.6 10*3/uL (ref 0.1–1.0)
Monocytes Relative: 9 %
Neutro Abs: 4.3 10*3/uL (ref 1.7–7.7)
Neutrophils Relative %: 65 %
Platelets: 199 10*3/uL (ref 150–400)
RBC: 4.44 MIL/uL (ref 4.22–5.81)
RDW: 13.7 % (ref 11.5–15.5)
WBC: 6.6 10*3/uL (ref 4.0–10.5)
nRBC: 0 % (ref 0.0–0.2)

## 2020-02-10 LAB — COMPREHENSIVE METABOLIC PANEL
ALT: 12 U/L (ref 0–44)
AST: 21 U/L (ref 15–41)
Albumin: 4.1 g/dL (ref 3.5–5.0)
Alkaline Phosphatase: 76 U/L (ref 38–126)
Anion gap: 11 (ref 5–15)
BUN: 19 mg/dL (ref 8–23)
CO2: 26 mmol/L (ref 22–32)
Calcium: 9.6 mg/dL (ref 8.9–10.3)
Chloride: 100 mmol/L (ref 98–111)
Creatinine, Ser: 0.83 mg/dL (ref 0.61–1.24)
GFR calc Af Amer: >60 mL/min (ref >60)
GFR calc non Af Amer: >60 mL/min (ref >60)
Glucose, Bld: 189 mg/dL — ABNORMAL HIGH (ref 70–99)
Potassium: 4.5 mmol/L (ref 3.5–5.1)
Sodium: 137 mmol/L (ref 135–145)
Total Bilirubin: 1.4 mg/dL — ABNORMAL HIGH (ref 0.3–1.2)
Total Protein: 7.2 g/dL (ref 6.5–8.1)

## 2020-02-10 MED ORDER — SODIUM CHLORIDE 0.9% FLUSH
10.0000 mL | Freq: Once | INTRAVENOUS | Status: DC
  Filled 2020-02-10: qty 10

## 2020-02-10 MED ORDER — SODIUM CHLORIDE 0.9 % IV SOLN
Freq: Once | INTRAVENOUS | Status: AC
  Filled 2020-02-10: qty 250

## 2020-02-10 MED ORDER — HEPARIN SOD (PORK) LOCK FLUSH 100 UNIT/ML IV SOLN
500.0000 [IU] | Freq: Once | INTRAVENOUS | Status: AC | PRN
  Administered 2020-02-10: 500 [IU]
  Filled 2020-02-10: qty 5

## 2020-02-10 MED ORDER — SODIUM CHLORIDE 0.9 % IV SOLN
1.0000 mg/kg | Freq: Once | INTRAVENOUS | Status: AC
  Administered 2020-02-10: 70 mg via INTRAVENOUS
  Filled 2020-02-10: qty 3

## 2020-02-10 MED ORDER — PALONOSETRON HCL INJECTION 0.25 MG/5ML
0.2500 mg | Freq: Once | INTRAVENOUS | Status: AC
  Administered 2020-02-10: 0.25 mg via INTRAVENOUS
  Filled 2020-02-10: qty 5

## 2020-02-10 MED ORDER — HEPARIN SOD (PORK) LOCK FLUSH 100 UNIT/ML IV SOLN
INTRAVENOUS | Status: AC
  Filled 2020-02-10: qty 5

## 2020-02-12 ENCOUNTER — Telehealth: Payer: Self-pay

## 2020-02-12 NOTE — Telephone Encounter (Signed)
RN call to home and cell to schedule follow up visit from palliative care NP.  Message left.

## 2020-02-23 ENCOUNTER — Inpatient Hospital Stay: Payer: Medicare Other | Admitting: Hospice and Palliative Medicine

## 2020-02-24 ENCOUNTER — Telehealth: Payer: Self-pay | Admitting: *Deleted

## 2020-02-24 ENCOUNTER — Inpatient Hospital Stay (HOSPITAL_BASED_OUTPATIENT_CLINIC_OR_DEPARTMENT_OTHER): Payer: Medicare Other | Admitting: Hospice and Palliative Medicine

## 2020-02-24 DIAGNOSIS — C642 Malignant neoplasm of left kidney, except renal pelvis: Secondary | ICD-10-CM

## 2020-02-24 DIAGNOSIS — I25119 Atherosclerotic heart disease of native coronary artery with unspecified angina pectoris: Secondary | ICD-10-CM | POA: Diagnosis not present

## 2020-02-24 DIAGNOSIS — Z515 Encounter for palliative care: Secondary | ICD-10-CM

## 2020-02-24 DIAGNOSIS — E119 Type 2 diabetes mellitus without complications: Secondary | ICD-10-CM

## 2020-02-24 NOTE — Telephone Encounter (Signed)
Copied from [place] [id number]. Topic: General - Inquiry >> Feb 24, 2020 10:56 AM Alease Frame wrote: Reason for CRM: Josh from [facility] called in regards to mutual pt . Josh states the pt has called before and has not heard back about  glucose meter . Please reach out to pt .

## 2020-02-24 NOTE — Progress Notes (Signed)
Virtual Visit via Video Note  I connected with Patrick Mcgee on 02/24/20 at 10:45 AM EDT by a video enabled telemedicine application and verified that I am speaking with the correct person using two identifiers.   I discussed the limitations of evaluation and management by telemedicine and the availability of in person appointments. The patient expressed understanding and agreed to proceed.  History of Present Illness: [name] is a 74 y.o.malewith multiple medical problems including metastatic urothelial carcinoma originally diagnosed in May 2019 status post chemotherapy and immunotherapy. PMH is also notable for mild interstitial lung disease, diabetes, and CAD status post previous stenting.  He stands in April 2021 revealed upper urothelial mass but no distant metastatic disease.  Patient is deciding on whether he wants to pursue further treatment. Patient was referred to palliative care to help address goals and manage ongoing symptoms.   Observations/Objective: Patient decided to pursue treatment and was started on Padcev.   Patient reports he is doing well.  He denies any significant changes or concerns.  No symptomatic complaints at present.  He reports stable and appetite.  No issues with medications nor need for refills today.  Patient states that he has been trying to obtain glucometer strips and insurance would not approve those unless there is approval from his PCP.  He says he has tried calling his PCP several times.  I called Dr. Alben Spittle office and left a message.  Assessment and Plan: Stage IV urothelial cancer -on treatment with Padcev. Followed by Dr. Janese Banks  Follow Up Instructions: Follow-up MyChart visit in 1 to 2 months   I discussed the assessment and treatment plan with the patient. The patient was provided an opportunity to ask questions and all were answered. The patient agreed with the plan and demonstrated an understanding of the instructions.   The  patient was advised to call back or seek an in-person evaluation if the symptoms worsen or if the condition fails to improve as anticipated.  I provided 15 minutes of non-face-to-face time during this encounter.   Irean Hong, NP

## 2020-02-26 MED ORDER — DEXCOM G6 RECEIVER DEVI: 1.0000 | 0 refills | Status: DC

## 2020-02-26 MED ORDER — DEXCOM G6 TRANSMITTER MISC
1.0000 | 0 refills | Status: DC
Start: 2020-02-26 — End: 2020-04-27

## 2020-02-26 MED ORDER — DEXCOM G6 SENSOR MISC: 1.0000 | 0 refills | Status: DC

## 2020-02-26 NOTE — Telephone Encounter (Signed)
Rx for Dexcom was sent to pharmacy. This was overlooked at patient's last ov.

## 2020-02-28 ENCOUNTER — Other Ambulatory Visit: Payer: Self-pay | Admitting: Family Medicine

## 2020-02-28 NOTE — Telephone Encounter (Signed)
Requested Prescriptions  Pending Prescriptions Disp Refills  . metFORMIN (GLUCOPHAGE) 1000 MG tablet [Pharmacy Med Name: metFORMIN HCl 1000 MG Oral Tablet] 180 tablet 0    Sig: TAKE 1 TABLET BY MOUTH TWICE DAILY WITH MEALS     Endocrinology:  Diabetes - Biguanides Passed - 02/28/2020  5:30 AM      Passed - Cr in normal range and within 360 days    Creatinine  Date Value Ref Range Status  02/03/2013 0.69 0.60 - 1.30 mg/dL Final   Creatinine, Ser  Date Value Ref Range Status  02/10/2020 0.83 0.61 - 1.24 mg/dL Final         Passed - HBA1C is between 0 and 7.9 and within 180 days    Hgb A1c MFr Bld  Date Value Ref Range Status  12/25/2019 7.9 (H) 4.8 - 5.6 % Final    Comment:             Prediabetes: 5.7 - 6.4          Diabetes: >6.4          Glycemic control for adults with diabetes: <7.0          Passed - eGFR in normal range and within 360 days    EGFR (African American)  Date Value Ref Range Status  02/03/2013 >60  Final   GFR calc Af Amer  Date Value Ref Range Status  02/10/2020 >60 >60 mL/min Final   EGFR (Non-African Amer.)  Date Value Ref Range Status  02/03/2013 >60  Final    Comment:    eGFR values <60mL/min/1.73 m2 may be an indication of chronic kidney disease (CKD). Calculated eGFR is useful in patients with stable renal function. The eGFR calculation will not be reliable in acutely ill patients when serum creatinine is changing rapidly. It is not useful in  patients on dialysis. The eGFR calculation may not be applicable to patients at the low and high extremes of body sizes, pregnant women, and vegetarians.    GFR calc non Af Amer  Date Value Ref Range Status  02/10/2020 >60 >60 mL/min Final         Passed - Valid encounter within last 6 months    Recent Outpatient Visits          2 months ago Type 2 diabetes mellitus without complication, without long-term current use of insulin [facility])   [facility] Jerrol Banana., MD   6  months ago Type 2 diabetes mellitus without complication, without long-term current use of insulin [facility])   [facility] Jerrol Banana., MD   10 months ago Essential hypertension   [facility] Jerrol Banana., MD   11 months ago Type 2 diabetes mellitus without complication, without long-term current use of insulin [facility])   [facility] Jerrol Banana., MD   1 year ago Type 2 diabetes mellitus without complication, without long-term current use of insulin [facility])   [facility] Jerrol Banana., MD      Future Appointments            In 1 month Borders, Kirt Boys, NP [facility]   In 1 month Jerrol Banana., MD [facility] [place]

## 2020-03-01 ENCOUNTER — Other Ambulatory Visit: Payer: Self-pay | Admitting: Family Medicine

## 2020-03-01 MED ORDER — LOSARTAN POTASSIUM 50 MG PO TABS: 50.0000 mg | ORAL_TABLET | Freq: Every day | ORAL | 3 refills | Status: DC

## 2020-03-01 NOTE — Telephone Encounter (Signed)
[facility] faxed refill request for the following medications:  losartan (COZAAR) 50 MG tablet    Please advise.  

## 2020-03-09 ENCOUNTER — Inpatient Hospital Stay (HOSPITAL_BASED_OUTPATIENT_CLINIC_OR_DEPARTMENT_OTHER): Payer: Medicare Other | Admitting: Oncology

## 2020-03-09 ENCOUNTER — Inpatient Hospital Stay: Payer: Medicare Other

## 2020-03-09 ENCOUNTER — Other Ambulatory Visit: Payer: Self-pay

## 2020-03-09 ENCOUNTER — Encounter: Payer: Self-pay | Admitting: Oncology

## 2020-03-09 VITALS — BP 124/86 | HR 65 | Temp 97.8°F

## 2020-03-09 VITALS — Resp 20

## 2020-03-09 DIAGNOSIS — G620 Drug-induced polyneuropathy: Secondary | ICD-10-CM

## 2020-03-09 DIAGNOSIS — Z5112 Encounter for antineoplastic immunotherapy: Secondary | ICD-10-CM | POA: Diagnosis not present

## 2020-03-09 DIAGNOSIS — Z5111 Encounter for antineoplastic chemotherapy: Secondary | ICD-10-CM

## 2020-03-09 DIAGNOSIS — C689 Malignant neoplasm of urinary organ, unspecified: Secondary | ICD-10-CM

## 2020-03-09 DIAGNOSIS — D701 Agranulocytosis secondary to cancer chemotherapy: Secondary | ICD-10-CM

## 2020-03-09 DIAGNOSIS — C642 Malignant neoplasm of left kidney, except renal pelvis: Secondary | ICD-10-CM

## 2020-03-09 DIAGNOSIS — I25119 Atherosclerotic heart disease of native coronary artery with unspecified angina pectoris: Secondary | ICD-10-CM | POA: Diagnosis not present

## 2020-03-09 DIAGNOSIS — T451X5A Adverse effect of antineoplastic and immunosuppressive drugs, initial encounter: Secondary | ICD-10-CM | POA: Diagnosis not present

## 2020-03-09 LAB — CBC WITH DIFFERENTIAL/PLATELET
Abs Immature Granulocytes: 0.01 10*3/uL (ref 0.00–0.07)
Basophils Absolute: 0.1 10*3/uL (ref 0.0–0.1)
Basophils Relative: 1 %
Eosinophils Absolute: 0.3 10*3/uL (ref 0.0–0.5)
Eosinophils Relative: 4 %
HCT: 37.4 % — ABNORMAL LOW (ref 39.0–52.0)
Hemoglobin: 12.9 g/dL — ABNORMAL LOW (ref 13.0–17.0)
Immature Granulocytes: 0 %
Lymphocytes Relative: 25 %
Lymphs Abs: 1.6 10*3/uL (ref 0.7–4.0)
MCH: 29.9 pg (ref 26.0–34.0)
MCHC: 34.5 g/dL (ref 30.0–36.0)
MCV: 86.8 fL (ref 80.0–100.0)
Monocytes Absolute: 0.6 10*3/uL (ref 0.1–1.0)
Monocytes Relative: 9 %
Neutro Abs: 3.9 10*3/uL (ref 1.7–7.7)
Neutrophils Relative %: 61 %
Platelets: 212 10*3/uL (ref 150–400)
RBC: 4.31 MIL/uL (ref 4.22–5.81)
RDW: 14.1 % (ref 11.5–15.5)
WBC: 6.5 10*3/uL (ref 4.0–10.5)
nRBC: 0 % (ref 0.0–0.2)

## 2020-03-09 LAB — COMPREHENSIVE METABOLIC PANEL
ALT: 14 U/L (ref 0–44)
AST: 23 U/L (ref 15–41)
Albumin: 4.1 g/dL (ref 3.5–5.0)
Alkaline Phosphatase: 72 U/L (ref 38–126)
Anion gap: 9 (ref 5–15)
BUN: 23 mg/dL (ref 8–23)
CO2: 27 mmol/L (ref 22–32)
Calcium: 9.8 mg/dL (ref 8.9–10.3)
Chloride: 100 mmol/L (ref 98–111)
Creatinine, Ser: 0.85 mg/dL (ref 0.61–1.24)
GFR calc Af Amer: >60 mL/min (ref >60)
GFR calc non Af Amer: >60 mL/min (ref >60)
Glucose, Bld: 148 mg/dL — ABNORMAL HIGH (ref 70–99)
Potassium: 4.3 mmol/L (ref 3.5–5.1)
Sodium: 136 mmol/L (ref 135–145)
Total Bilirubin: 1.2 mg/dL (ref 0.3–1.2)
Total Protein: 7.2 g/dL (ref 6.5–8.1)

## 2020-03-09 MED ORDER — SODIUM CHLORIDE 0.9 % IV SOLN
1.0000 mg/kg | Freq: Once | INTRAVENOUS | Status: AC
  Administered 2020-03-09: 70 mg via INTRAVENOUS
  Filled 2020-03-09: qty 3

## 2020-03-09 MED ORDER — SODIUM CHLORIDE 0.9 % IV SOLN
Freq: Once | INTRAVENOUS | Status: AC
  Filled 2020-03-09: qty 250

## 2020-03-09 MED ORDER — PREGABALIN 75 MG PO CAPS: 75.0000 mg | ORAL_CAPSULE | Freq: Every day | ORAL | 1 refills | Status: DC

## 2020-03-09 MED ORDER — HEPARIN SOD (PORK) LOCK FLUSH 100 UNIT/ML IV SOLN
INTRAVENOUS | Status: AC
  Filled 2020-03-09: qty 5

## 2020-03-09 MED ORDER — PALONOSETRON HCL INJECTION 0.25 MG/5ML
0.2500 mg | Freq: Once | INTRAVENOUS | Status: AC
  Administered 2020-03-09: 0.25 mg via INTRAVENOUS
  Filled 2020-03-09: qty 5

## 2020-03-09 MED ORDER — SODIUM CHLORIDE 0.9% FLUSH
10.0000 mL | INTRAVENOUS | Status: DC | PRN
  Administered 2020-03-09: 10 mL via INTRAVENOUS
  Filled 2020-03-09: qty 10

## 2020-03-09 MED ORDER — HEPARIN SOD (PORK) LOCK FLUSH 100 UNIT/ML IV SOLN
500.0000 [IU] | Freq: Once | INTRAVENOUS | Status: AC
  Administered 2020-03-09: 500 [IU] via INTRAVENOUS
  Filled 2020-03-09: qty 5

## 2020-03-09 NOTE — Progress Notes (Signed)
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 [name] so far for his metastatic urothelial carcinoma. This was originally diagnosed in May 2019. He was noted to have a filling defect in the lower pole collecting system of the left kidney along with para-aortic and retroperitoneal adenopathy concerning for metastatic disease. He underwent left ureteroscopy and renal pelvis biopsy which revealed small fragments of high-grade urothelial carcinoma with small focus of invasion. He was started on carboplatin and gemcitabine chemotherapy in June 2019 and was continued on 05/27/2018 for 8 cycles. He tolerated chemotherapy well except for chemo-induced anemia for which she has been getting Procrit every 2 weeks. He did have response to his disease based on scans in August 2019. However repeat scan on 05/31/2018 showed increase in the size of the primary tumor from 1.2 to 1.9 cm and increase in left para-aortic adenopathy from 0.9 to 1.3 cm. Periportal adenopathy was stable at 1.4 cm. Second line immunotherapy was recommended. His initial biopsy specimen did not have enough sample to undergo FGFR mutation testing. He also has mild interstitial lung disease for which he sees  pulmonary but he is not on home oxygen. He has B12 deficiency for which he is on oral B12. Also has diabetes and coronary artery disease.Tecentriq started on 06/17/2018.Patient noted to have disease progression in his lymph nodes in May 2020. Repeat biopsy showed metastatic urothelial carcinoma which did not have aFGFR mutation. He has been started on third line Padcev  Treatment on hold since April 2021 due to neuropathy and to maintain quality of life but restarted in August 2021 after he was found to have progression of disease  Interval history-patient reports that when he last received his chemotherapy he felt fatigued for almost 2 to 3 weeks.  He spends most of his time resting or sleeping.  Feels that his neuropathy persists and gabapentin is not helping him as much and makes him feel more sleepy.  Denies any abdominal pain  ECOG PS- 2 Pain scale- 0 Opioid associated constipation- no  Review of systems- Review of Systems  Constitutional: Positive for malaise/fatigue. Negative for chills, fever and weight loss.  HENT: Negative for congestion, ear discharge and nosebleeds.   Eyes: Negative for blurred vision.  Respiratory: Negative for cough, hemoptysis, sputum production, shortness of breath and wheezing.   Cardiovascular: Negative for chest pain, palpitations, orthopnea and claudication.  Gastrointestinal: Negative for abdominal pain, blood in stool, constipation, diarrhea, heartburn, melena, nausea and vomiting.  Genitourinary: Negative for dysuria, flank pain, frequency, hematuria and urgency.  Musculoskeletal: Negative for back pain, joint pain and myalgias.  Skin: Negative for rash.  Neurological: Positive for sensory change (Peripheral neuropathy). Negative for dizziness, tingling, focal weakness, seizures, weakness and headaches.  Endo/Heme/Allergies: Does not bruise/bleed easily.  Psychiatric/Behavioral: Negative for depression and suicidal ideas. The patient does not  have insomnia.  Allergies  Allergen Reactions  . Sulfa Antibiotics Other (See Comments)    Joint pain  . Ace Inhibitors Cough  . Invokana [Canagliflozin] Other (See Comments)    Leg pain  . Penicillins Rash    Did it involve swelling of the face/tongue/throat, SOB, or low BP? No Did it involve sudden or severe rash/hives, skin peeling, or any reaction on the inside of your mouth or nose? Yes Did you need to seek medical attention at a hospital or doctor's office? Yes When did it last happen?childhood allergy If all above answers are "NO", may proceed with cephalosporin use.      Past Medical History:  Diagnosis Date  . Acid reflux   . Anxiety   . Depression   . Diabetes mellitus without complication ([place])   . Hyperlipidemia   . Hypertension   . MRSA (methicillin resistant Staphylococcus aureus) infection 1992   history  . Myocardial infarction ([place]) 1995  . Sleep apnea    CPAP  . Urothelial cancer ([place]) 11/2017   Left Urothelial mass, chemo tx's     Past Surgical History:  Procedure Laterality Date  . CATARACT EXTRACTION Left   . COLONOSCOPY  2010   [facility]  . COLONOSCOPY WITH PROPOFOL N/A 10/04/2016   Procedure: COLONOSCOPY WITH PROPOFOL;  Surgeon: [name], MD;  Location: [facility] ENDOSCOPY;  Service: Endoscopy;  Laterality: N/A;  . [place], 2000, 2001, 2014  . CYSTOSCOPY W/ RETROGRADES Bilateral 12/10/2017   Procedure: CYSTOSCOPY WITH RETROGRADE PYELOGRAM;  Surgeon: [name], MD;  Location: [facility] ORS;  Service: Urology;  Laterality: Bilateral;  . CYSTOSCOPY W/ RETROGRADES Left 12/09/2018   Procedure: CYSTOSCOPY WITH RETROGRADE PYELOGRAM;  Surgeon: [name], MD;  Location: [facility] ORS;  Service: Urology;  Laterality: Left;  . CYSTOSCOPY WITH BIOPSY Left 12/09/2018   Procedure: CYSTOSCOPY WITH URETERAL/RENAL PELVIC BIOPSY;  Surgeon: [name], MD;  Location: [facility] ORS;  Service: Urology;   Laterality: Left;  . CYSTOSCOPY WITH STENT PLACEMENT Left 12/10/2017   Procedure: CYSTOSCOPY WITH STENT PLACEMENT;  Surgeon: [name], MD;  Location: [facility] ORS;  Service: Urology;  Laterality: Left;  . CYSTOSCOPY WITH STENT PLACEMENT Left 12/09/2018   Procedure: CYSTOSCOPY WITH STENT PLACEMENT;  Surgeon: [name], MD;  Location: [facility] ORS;  Service: Urology;  Laterality: Left;  . CYSTOSCOPY WITH URETEROSCOPY Left 12/09/2018   Procedure: CYSTOSCOPY WITH URETEROSCOPY;  Surgeon: [name], MD;  Location: [facility] ORS;  Service: Urology;  Laterality: Left;  . EYE SURGERY Bilateral    cataract  . INGUINAL HERNIA REPAIR Right 08/09/2015   Procedure: HERNIA REPAIR INGUINAL ADULT;  Surgeon: [name], MD;  Location: [facility] ORS;  Service: General;  Laterality: Right;  . NASAL SINUS SURGERY    . nuclear stress test    . PORTA CATH INSERTION N/A 12/19/2017   Procedure: PORTA CATH INSERTION;  Surgeon: [name], MD;  Location: [place] CV LAB;  Service: Cardiovascular;  Laterality: N/A;  . URETERAL BIOPSY Left 12/10/2017   Procedure: URETERAL & renal PELVIS BIOPSY;  Surgeon: [name], MD;  Location: [facility] ORS;  Service: Urology;  Laterality: Left;  . URETEROSCOPY Left 12/10/2017   Procedure: URETEROSCOPY;  Surgeon: [name], MD;  Location: [facility] ORS;  Service: Urology;  Laterality: Left;    Social History   Socioeconomic History  . Marital status: Married    Spouse name: [name]  . Number of children: 1  . Years of education: Not on file  .  Highest education level: Associate degree: occupational, Hotel manager, or vocational program  Occupational History  . Occupation: retired  Tobacco Use  . Smoking status: Former Smoker    Types: Cigars    Quit date: 10/09/1978    Years since quitting: 41.4  . Smokeless tobacco: Former Systems developer    Types: Chew    Quit date: 12/04/1988  . Tobacco comment: on occasion  Vaping Use  . Vaping Use: Never used  Substance and Sexual Activity  .  Alcohol use: Not Currently  . Drug use: No  . Sexual activity: Not Currently  Other Topics Concern  . Not on file  Social History Narrative  . Not on file   Social Determinants of Health   Financial Resource Strain:   . Difficulty of Paying Living Expenses: Not on file  Food Insecurity:   . Worried About Charity fundraiser in the Last Year: Not on file  . Ran Out of Food in the Last Year: Not on file  Transportation Needs:   . Lack of Transportation (Medical): Not on file  . Lack of Transportation (Non-Medical): Not on file  Physical Activity:   . Days of Exercise per Week: Not on file  . Minutes of Exercise per Session: Not on file  Stress: No Stress Concern Present  . Feeling of Stress : Not at all  Social Connections: Unknown  . Frequency of Communication with Friends and Family: Patient refused  . Frequency of Social Gatherings with Friends and Family: Patient refused  . Attends Religious Services: Patient refused  . Active Member of Clubs or Organizations: Patient refused  . Attends Archivist Meetings: Patient refused  . Marital Status: Patient refused  Intimate Partner Violence: Unknown  . Fear of Current or Ex-Partner: Patient refused  . Emotionally Abused: Patient refused  . Physically Abused: Patient refused  . Sexually Abused: Patient refused    Family History  Problem Relation Age of Onset  . Heart disease Mother   . Cancer Father        Lung and colon cancer  . Heart disease Father   . Emphysema Maternal Grandfather   . Tuberculosis Maternal Grandmother      Current Outpatient Medications:  .  aspirin EC 81 MG tablet, Take 81 mg by mouth every evening. , Disp: , Rfl:  .  atorvastatin (LIPITOR) 40 MG tablet, TAKE 1 TABLET BY MOUTH AT BEDTIME, Disp: 90 tablet, Rfl: 4 .  Cholecalciferol (VITAMIN D3) 25 MCG (1000 UT) CAPS, Take 1 capsule by mouth daily., Disp: , Rfl:  .  clobetasol cream (TEMOVATE) 1.51 %, Apply 1 application topically as needed  (for skin rash)., Disp: 30 g, Rfl: 1 .  Continuous Blood Gluc Receiver ([place]) DEVI, 1 each by Other route continuous. Dx Code: E11.9, Disp: 1 each, Rfl: 0 .  Continuous Blood Gluc Sensor (DEXCOM G6 SENSOR) MISC, 1 each by Does not apply route continuous. Dx Code: E11.9, Disp: 1 each, Rfl: 0 .  Continuous Blood Gluc Transmit (DEXCOM G6 TRANSMITTER) MISC, 1 each by Does not apply route continuous. Dx Code: E11.9, Disp: 1 each, Rfl: 0 .  empagliflozin (JARDIANCE) 10 MG TABS tablet, Take 1 tablet (10 mg total) by mouth daily., Disp: 90 tablet, Rfl: 2 .  glucose blood (CONTOUR NEXT TEST) test strip, 1 each by Other route in the morning and at bedtime. USE 1 STRIP TO CHECK GLUCOSE ONCE DAILY, Disp: 100 each, Rfl: 3 .  losartan (COZAAR) 50 MG tablet, Take  1 tablet (50 mg total) by mouth daily., Disp: 90 tablet, Rfl: 3 .  magnesium hydroxide (MILK OF MAGNESIA) 400 MG/5ML suspension, Take 15 mLs by mouth daily as needed for mild constipation., Disp: , Rfl:  .  metFORMIN (GLUCOPHAGE) 1000 MG tablet, TAKE 1 TABLET BY MOUTH TWICE DAILY WITH MEALS, Disp: 180 tablet, Rfl: 0 .  sertraline (ZOLOFT) 100 MG tablet, Take 1 tablet by mouth once daily, Disp: 90 tablet, Rfl: 0 .  vitamin B-12 (CYANOCOBALAMIN) 1000 MCG tablet, Take by mouth daily. Unsure dose, Disp: , Rfl:  .  fluticasone (FLONASE) 50 MCG/ACT nasal spray, Use 2 spray(s) in each nostril once daily (Patient not taking: Reported on 11/04/2019), Disp: 48 g, Rfl: 3 .  hydrOXYzine (ATARAX/VISTARIL) 25 MG tablet, Take 1 tablet (25 mg total) by mouth every 8 (eight) hours as needed for itching. (Patient not taking: Reported on 11/04/2019), Disp: 30 tablet, Rfl: 3 .  Ivermectin (SOOLANTRA) 1 % CREA, Apply 1 application topically daily as needed (rosacea). To face (Patient not taking: Reported on 03/09/2020), Disp: , Rfl:  .  lidocaine-prilocaine (EMLA) cream, Apply 1 application topically as needed (port access). (Patient not taking: Reported on  03/09/2020), Disp: 1 g, Rfl: 3 .  magic mouthwash w/lidocaine SOLN, Take 5 mLs by mouth 4 (four) times daily as needed for mouth pain. (Patient not taking: Reported on 12/16/2019), Disp: 480 mL, Rfl: 3 .  nystatin cream (MYCOSTATIN), Apply topically 2 (two) times daily. (Patient not taking: Reported on 01/22/2020), Disp: 15 g, Rfl: 1 .  OVER THE COUNTER MEDICATION, Apply 1 application topically daily as needed (pain). Outback topical pain relief (Patient not taking: Reported on 03/09/2020), Disp: , Rfl:  .  pregabalin (LYRICA) 75 MG capsule, Take 1 capsule (75 mg total) by mouth daily., Disp: 30 capsule, Rfl: 1 .  vitamin C (ASCORBIC ACID) 500 MG tablet, Take 1,000-1,500 mg by mouth daily as needed (immune support). (Patient not taking: Reported on 01/22/2020), Disp: , Rfl:  No current facility-administered medications for this visit.  Facility-Administered Medications Ordered in Other Visits:  .  heparin lock flush 100 unit/mL, 500 Units, Intravenous, Once, Sindy Guadeloupe, MD .  Influenza vac split quadrivalent PF (FLUZONE HIGH-DOSE) injection 0.5 mL, 0.5 mL, Intramuscular, Once, Honor Loh E, NP .  sodium chloride flush (NS) 0.9 % injection 10 mL, 10 mL, Intravenous, PRN, Sindy Guadeloupe, MD, 10 mL at 03/09/20 1253  Physical exam:  Vitals:   03/09/20 1307  BP: 124/86  Pulse: 65  Temp: 97.8 F (36.6 C)  TempSrc: Oral  SpO2: 97%   Physical Exam Constitutional:      Comments: Thin elderly gentleman in no acute distress  Pulmonary:     Effort: Pulmonary effort is normal.  Musculoskeletal:     Right lower leg: No edema.     Left lower leg: No edema.  Skin:    General: Skin is warm and dry.  Neurological:     Mental Status: He is alert and oriented to person, place, and time.      CMP Latest Ref Rng & Units 03/09/2020  Glucose 70 - 99 mg/dL 148(H)  BUN 8 - 23 mg/dL 23  Creatinine 0.61 - 1.24 mg/dL 0.85  Sodium 135 - 145 mmol/L 136  Potassium 3.5 - 5.1 mmol/L 4.3  Chloride 98 - 111  mmol/L 100  CO2 22 - 32 mmol/L 27  Calcium 8.9 - 10.3 mg/dL 9.8  Total Protein 6.5 - 8.1 g/dL 7.2  Total Bilirubin 0.3 -  1.2 mg/dL 1.2  Alkaline Phos 38 - 126 U/L 72  AST 15 - 41 U/L 23  ALT 0 - 44 U/L 14   CBC Latest Ref Rng & Units 03/09/2020  WBC 4.0 - 10.5 K/uL 6.5  Hemoglobin 13.0 - 17.0 g/dL 12.9(L)  Hematocrit 39 - 52 % 37.4(L)  Platelets 150 - 400 K/uL 212     Assessment and plan- Patient is a 74 y.o. 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   He is here for on treatment assessment prior to next cycle of Padcev  Counts okay to proceed with next cycle of Padcev today.  He has been getting a reduced dose of 1 mg/kg 1 week on 3 weeks of to preserve his quality of life.  Previously when he was on this dose his scans had continued to show response.  Patient will continue his treatment for now unless he feels that his quality of life is adversely affected.  I will see him back in 4 weeks with CBC with differential and CMP for next cycle of Padcev.  Chemo-induced peripheral neuropathy: We will discontinue his gabapentin and switch him to Lyrica 75 mg twice daily.  If it does not need 20 significant improvement I will consider switching from Zoloft to Cymbalta instead.  Patient is vaccinated against Covid but is not interested in getting his booster dose.  He would be eligible for this given that he is immunocompromised and have encouraged him to do so   Visit Diagnosis 1. Encounter for antineoplastic chemotherapy   2. Chemotherapy-induced peripheral neuropathy ([place])      Dr. Randa Evens, MD, MPH [facility] [phone] 03/09/2020 3:16 PM

## 2020-03-17 ENCOUNTER — Telehealth: Payer: Self-pay | Admitting: Family Medicine

## 2020-03-17 NOTE — Telephone Encounter (Signed)
Copied from [place] [id number]. Topic: Medicare AWV >> Mar 17, 2020  9:15 AM Cher Nakai R wrote: Reason for CRM: Left message to reschedule AWVS scheduled on Sept 20th to Set 27th at 2:00pm due to [facility] schedule change.  If new appointment is not good for patient,please reschedule for another day

## 2020-03-24 ENCOUNTER — Other Ambulatory Visit: Payer: Self-pay

## 2020-03-24 ENCOUNTER — Encounter: Payer: Self-pay | Admitting: Family Medicine

## 2020-03-24 ENCOUNTER — Ambulatory Visit (INDEPENDENT_AMBULATORY_CARE_PROVIDER_SITE_OTHER): Payer: Medicare Other | Admitting: Family Medicine

## 2020-03-24 VITALS — BP 91/60 | HR 82 | Temp 98.8°F | Resp 16 | Ht 68.0 in | Wt 158.0 lb

## 2020-03-24 DIAGNOSIS — Z23 Encounter for immunization: Secondary | ICD-10-CM | POA: Diagnosis not present

## 2020-03-24 DIAGNOSIS — I2510 Atherosclerotic heart disease of native coronary artery without angina pectoris: Secondary | ICD-10-CM

## 2020-03-24 DIAGNOSIS — E538 Deficiency of other specified B group vitamins: Secondary | ICD-10-CM | POA: Diagnosis not present

## 2020-03-24 DIAGNOSIS — I10 Essential (primary) hypertension: Secondary | ICD-10-CM | POA: Diagnosis not present

## 2020-03-24 DIAGNOSIS — E440 Moderate protein-calorie malnutrition: Secondary | ICD-10-CM

## 2020-03-24 DIAGNOSIS — E039 Hypothyroidism, unspecified: Secondary | ICD-10-CM

## 2020-03-24 DIAGNOSIS — E1142 Type 2 diabetes mellitus with diabetic polyneuropathy: Secondary | ICD-10-CM

## 2020-03-24 DIAGNOSIS — E7849 Other hyperlipidemia: Secondary | ICD-10-CM | POA: Diagnosis not present

## 2020-03-24 DIAGNOSIS — Z0000 Encounter for general adult medical examination without abnormal findings: Secondary | ICD-10-CM | POA: Diagnosis not present

## 2020-03-24 DIAGNOSIS — C689 Malignant neoplasm of urinary organ, unspecified: Secondary | ICD-10-CM | POA: Diagnosis not present

## 2020-03-24 NOTE — Progress Notes (Signed)
Annual Wellness Visit     Patient: Patrick Mcgee, Male    DOB: 1945-12-14, 74 y.o.   MRN: [id number] Visit Date: 03/24/2020  Today's Provider: Wilhemena Durie, MD   Chief Complaint  Patient presents with  . Annual Wellness   Subjective    Patrick Mcgee is a 74 y.o. male who presents today for his Annual Wellness Visit. He reports consuming a general diet. The patient does not participate in regular exercise at present. He generally feels well. He reports sleeping well. He does not have additional problems to discuss today.       Medications: Outpatient Medications Prior to Visit  Medication Sig  . aspirin EC 81 MG tablet Take 81 mg by mouth every evening.   Marland Kitchen atorvastatin (LIPITOR) 40 MG tablet TAKE 1 TABLET BY MOUTH AT BEDTIME  . Cholecalciferol (VITAMIN D3) 25 MCG (1000 UT) CAPS Take 1 capsule by mouth daily.  . clobetasol cream (TEMOVATE) 6.31 % Apply 1 application topically as needed (for skin rash).  . Continuous Blood Gluc Receiver ([place]) DEVI 1 each by Other route continuous. Dx Code: E11.9  . Continuous Blood Gluc Sensor (DEXCOM G6 SENSOR) MISC 1 each by Does not apply route continuous. Dx Code: E11.9  . Continuous Blood Gluc Transmit (DEXCOM G6 TRANSMITTER) MISC 1 each by Does not apply route continuous. Dx Code: E11.9  . empagliflozin (JARDIANCE) 10 MG TABS tablet Take 1 tablet (10 mg total) by mouth daily.  Marland Kitchen glucose blood (CONTOUR NEXT TEST) test strip 1 each by Other route in the morning and at bedtime. USE 1 STRIP TO CHECK GLUCOSE ONCE DAILY  . losartan (COZAAR) 50 MG tablet Take 1 tablet (50 mg total) by mouth daily.  . magnesium hydroxide (MILK OF MAGNESIA) 400 MG/5ML suspension Take 15 mLs by mouth daily as needed for mild constipation.  . metFORMIN (GLUCOPHAGE) 1000 MG tablet TAKE 1 TABLET BY MOUTH TWICE DAILY WITH MEALS  . pregabalin (LYRICA) 75 MG capsule Take 1 capsule (75 mg total) by mouth daily.  . sertraline (ZOLOFT) 100 MG tablet  Take 1 tablet by mouth once daily  . vitamin B-12 (CYANOCOBALAMIN) 1000 MCG tablet Take by mouth daily. Unsure dose  . fluticasone (FLONASE) 50 MCG/ACT nasal spray Use 2 spray(s) in each nostril once daily (Patient not taking: Reported on 11/04/2019)  . hydrOXYzine (ATARAX/VISTARIL) 25 MG tablet Take 1 tablet (25 mg total) by mouth every 8 (eight) hours as needed for itching. (Patient not taking: Reported on 11/04/2019)  . Ivermectin (SOOLANTRA) 1 % CREA Apply 1 application topically daily as needed (rosacea). To face (Patient not taking: Reported on 03/09/2020)  . lidocaine-prilocaine (EMLA) cream Apply 1 application topically as needed (port access). (Patient not taking: Reported on 03/09/2020)  . magic mouthwash w/lidocaine SOLN Take 5 mLs by mouth 4 (four) times daily as needed for mouth pain. (Patient not taking: Reported on 12/16/2019)  . nystatin cream (MYCOSTATIN) Apply topically 2 (two) times daily. (Patient not taking: Reported on 01/22/2020)  . OVER THE COUNTER MEDICATION Apply 1 application topically daily as needed (pain). Outback topical pain relief (Patient not taking: Reported on 03/09/2020)  . vitamin C (ASCORBIC ACID) 500 MG tablet Take 1,000-1,500 mg by mouth daily as needed (immune support). (Patient not taking: Reported on 01/22/2020)   Facility-Administered Medications Prior to Visit  Medication Dose Route Frequency Provider  . Influenza vac split quadrivalent PF (FLUZONE HIGH-DOSE) injection 0.5 mL  0.5 mL Intramuscular Once Karen Kitchens,  NP    Allergies  Allergen Reactions  . Sulfa Antibiotics Other (See Comments)    Joint pain  . Ace Inhibitors Cough  . Invokana [Canagliflozin] Other (See Comments)    Leg pain  . Penicillins Rash    Did it involve swelling of the face/tongue/throat, SOB, or low BP? No Did it involve sudden or severe rash/hives, skin peeling, or any reaction on the inside of your mouth or nose? Yes Did you need to seek medical attention at a hospital or  doctor's office? Yes When did it last happen?childhood allergy If all above answers are "NO", may proceed with cephalosporin use.     Patient Care Team: Jerrol Banana., MD as PCP - General (Family Medicine) Dingeldein, [name], MD as Consulting Physician (Ophthalmology) [name] as Consulting Physician (Internal Medicine) [name], MD as Consulting Physician (Pulmonary Disease) Sindy Guadeloupe, MD as Consulting Physician (Oncology)  Review of Systems  Constitutional: Positive for fatigue.  HENT: Negative.   Eyes: Negative.   Respiratory: Negative.   Cardiovascular: Negative.   Gastrointestinal: Negative.   Endocrine: Negative.   Genitourinary: Negative.   Musculoskeletal: Negative.   Skin: Negative.   Allergic/Immunologic: Positive for environmental allergies.  Neurological: Negative.   Hematological: Negative.   Psychiatric/Behavioral: Negative.        Objective    Vitals: BP 91/60   Pulse 82   Temp 98.8 F (37.1 C)   Resp 16   Ht 5\' 8"  (1.727 m)   Wt 158 lb (71.7 kg)   BMI 24.02 kg/m  Wt Readings from Last 3 Encounters:  03/24/20 158 lb (71.7 kg)  02/10/20 156 lb (70.8 kg)  01/22/20 155 lb (70.3 kg)      Physical Exam Constitutional:      Appearance: Normal appearance.  HENT:     Right Ear: Tympanic membrane normal.     Left Ear: Tympanic membrane normal.  Eyes:     Pupils: Pupils are equal, round, and reactive to light.  Cardiovascular:     Rate and Rhythm: Normal rate and regular rhythm.     Pulses: Normal pulses.     Heart sounds: Normal heart sounds.  Pulmonary:     Effort: Pulmonary effort is normal.     Breath sounds: Normal breath sounds.  Abdominal:     General: Bowel sounds are normal.  Musculoskeletal:        General: Normal range of motion.  Skin:    General: Skin is warm and dry.  Neurological:     Mental Status: He is alert and oriented to person, place, and time. Mental status is at baseline.    Psychiatric:        Mood and Affect: Mood normal.        Behavior: Behavior normal.     Most recent functional status assessment: In your present state of health, do you have any difficulty performing the following activities: 03/24/2020  Hearing? Y  Vision? N  Difficulty concentrating or making decisions? N  Walking or climbing stairs? N  Dressing or bathing? N  Doing errands, shopping? N  Some recent data might be hidden   Most recent fall risk assessment: Fall Risk  07/18/2019  Falls in the past year? 0  Comment -  Number falls in past yr: -  Injury with Fall? -  Follow up -    Most recent depression screenings: PHQ 2/9 Scores 03/24/2020 03/24/2019  PHQ - 2 Score 0 2  PHQ- 9 Score 5  8   Most recent cognitive screening: 6CIT Screen 03/13/2017  What Year? 0 points  What month? 0 points  What time? 0 points  Count back from 20 0 points  Months in reverse 0 points  Repeat phrase 0 points  Total Score 0   Most recent Audit-C alcohol use screening Alcohol Use Disorder Test (AUDIT) 03/24/2020  1. How often do you have a drink containing alcohol? 0  2. How many drinks containing alcohol do you have on a typical day when you are drinking? 0  3. How often do you have six or more drinks on one occasion? 0  AUDIT-C Score 0  Alcohol Brief Interventions/Follow-up AUDIT Score <7 follow-up not indicated   A score of 3 or more in women, and 4 or more in men indicates increased risk for alcohol abuse, EXCEPT if all of the points are from question 1   No results found for any visits on 03/24/20.  Assessment & Plan     Annual wellness visit done today including the all of the following: Reviewed patient's Family Medical History Reviewed and updated list of patient's medical providers Assessment of cognitive impairment was done Assessed patient's functional ability Established a written schedule for health screening [place]ak Completed and Reviewed  Exercise  Activities and Dietary recommendations Goals    . DIET - INCREASE WATER INTAKE     Recommend increasing water intake to 6-8 glasses a day.     . Exercise 3x per week (30 min per time)     Recommend to return to exercising 3 days a week for 30 minutes.        Immunization History  Administered Date(s) Administered  . Fluad Quad(high Dose 65+) 03/19/2019  . Influenza, High Dose Seasonal PF 06/20/2016, 03/13/2017, 05/06/2018  . PFIZER SARS-COV-2 Vaccination 07/16/2019, 08/06/2019  . Pneumococcal Conjugate-13 01/29/2014  . Pneumococcal Polysaccharide-23 09/06/2011  . Tdap 08/27/2007  . Zoster 10/26/2010    Health Maintenance  Topic Date Due  . FOOT EXAM  Never done  . TETANUS/TDAP  08/26/2017  . INFLUENZA VACCINE  02/08/2020  . OPHTHALMOLOGY EXAM  04/01/2020  . HEMOGLOBIN A1C  06/25/2020  . COLONOSCOPY  10/04/2021  . COVID-19 Vaccine  Completed  . Hepatitis C Screening  Completed  . PNA vac Low Risk Adult  Completed     Discussed health benefits of physical activity, and encouraged him to engage in regular exercise appropriate for his age and condition.   1. Medicare annual wellness visit, subsequent   2. 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 Arteriosclerosis of coronary artery All risk factors treated  4. Essential (primary) hypertension   5. B12 deficiency   6. Type 2 diabetes mellitus with diabetic polyneuropathy, without long-term current use of insulin (HCC) A1c is 7.7.  We will follow-up in 3 to 4 months with goal A1c less than 7.5 at this time  7. Adult hypothyroidism   8. Other hyperlipidemia   9. Malnutrition of moderate degree Patient has lost weight due to cancer.  Clinically stable at this time.   No follow-ups on file.     I, Wilhemena Durie, MD, have reviewed all documentation for this visit. The documentation on 03/28/20 for the exam, diagnosis, procedures, and orders are all accurate and complete.    Richard Cranford Mon, MD    [facility] [phone] (phone) [phone] (fax)  [place]

## 2020-03-27 ENCOUNTER — Other Ambulatory Visit: Payer: Self-pay | Admitting: Family Medicine

## 2020-03-27 DIAGNOSIS — F32A Depression, unspecified: Secondary | ICD-10-CM

## 2020-03-27 NOTE — Telephone Encounter (Signed)
Requested Prescriptions  Pending Prescriptions Disp Refills  . sertraline (ZOLOFT) 100 MG tablet [Pharmacy Med Name: Sertraline HCl 100 MG Oral Tablet] 90 tablet 1    Sig: Take 1 tablet by mouth once daily     Psychiatry:  Antidepressants - SSRI Passed - 03/27/2020  2:25 PM      Passed - Valid encounter within last 6 months    Recent Outpatient Visits          3 days ago    [facility] Rosanna Randy, Retia Passe., MD   3 months ago Type 2 diabetes mellitus without complication, without long-term current use of insulin [facility])   [facility] Jerrol Banana., MD   7 months ago Type 2 diabetes mellitus without complication, without long-term current use of insulin [facility])   [facility] Jerrol Banana., MD   11 months ago Essential hypertension   [facility] Jerrol Banana., MD   1 year ago Type 2 diabetes mellitus without complication, without long-term current use of insulin [facility])   [facility] Jerrol Banana., MD      Future Appointments            In 2 weeks Borders, Kirt Boys, NP [facility]   In 3 weeks Jerrol Banana., MD [facility] [place]   In 4 months Jerrol Banana., MD [facility]

## 2020-03-31 NOTE — Addendum Note (Signed)
Addended by: Wilburt Finlay on: 03/31/2020 11:21 AM   Modules accepted: Orders

## 2020-04-01 ENCOUNTER — Other Ambulatory Visit: Payer: Medicare Other | Admitting: Adult Health Nurse Practitioner

## 2020-04-01 ENCOUNTER — Other Ambulatory Visit: Payer: Self-pay

## 2020-04-01 DIAGNOSIS — Z515 Encounter for palliative care: Secondary | ICD-10-CM

## 2020-04-01 DIAGNOSIS — C642 Malignant neoplasm of left kidney, except renal pelvis: Secondary | ICD-10-CM

## 2020-04-01 NOTE — Progress Notes (Signed)
This encounter was created in error - please disregard.

## 2020-04-03 NOTE — Progress Notes (Signed)
[facility] Consult Note Telephone: [phone]  Fax: [phone]  PATIENT NAME: Patrick Mcgee DOB: 03-09-1946 MRN: [id number]  PRIMARY CARE PROVIDER:   Jerrol Mcgee., MD  REFERRING PROVIDER:  Jerrol Mcgee., MD [address] [place],   [address]  RESPONSIBLE PARTY:   Self and wife, Patrick Mcgee [phone]       RECOMMENDATIONS and PLAN:  1.  Advanced care planning.  Patient is DNR  2.  Pain.  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  Was on gabapentin but was not getting much relief.  Is getting better relief with Lyrica and he also uses Outback oil which he states gives him relief as well.  Continue current pain regimen  3.  Functional status.  Patient is independent of ADLs and IADLs.  He does get tired easily and takes frequent breaks.  He ambulates unassisted.  He continent of B&B.    4. Nutritional status.  Patient does state that he generally does not feel hungry and has to be reminded to eat.  He does eat and wife does state that he is more selective in what he eats than what he used to be.  He rarely eats meat.  Does supplement with Premier Protein drinks.  Weight has been stable at around 152-154.    Patient has not had any recent falls, infection or hospital visits.  Palliative will continue to monitor for symptom management/decline and make recommendations as needed. Next appointment in 8 weeks.  I spent 60 minutes providing this consultation,  from 11:00 to 12:00 including time spent with patient/family, chart review, provider coordination, documentation. More than 50% of the time in this consultation was spent coordinating communication.   HISTORY OF PRESENT ILLNESS:  PELLEGRINO Mcgee is a 74 y.o. year old male with multiple medical problems including urothelial carcinoma w/mets, CAD, DMT2, HTN, HLD. Palliative Care was asked to help address goals of care.   Patient had a  clear scan in May of this year and was on a break from chemo.  Treatment was restarted in August due to progression of the disease.  He is currently being treated with Padcev.  Denies headaches, dizziness, increased SOB or cough, N/V/D, constipation, dysuria, hematuria.    CODE STATUS: DNR  PPS: 60% HOSPICE ELIGIBILITY/DIAGNOSIS: TBD  PHYSICAL EXAM:  BP 110/70  HR 61  O2 98% on RA General: NAD, frail appearing Cardiovascular: regular rate and rhythm Pulmonary: lung sounds clear; normal respiratory effort Abdomen: soft, nontender, + bowel sounds GU: no suprapubic tenderness Extremities: no edema, no joint deformities Skin: no rashes on exposed skin Neurological: Weakness but otherwise nonfocal  PAST MEDICAL HISTORY:  Past Medical History:  Diagnosis Date   Acid reflux    Anxiety    Depression    Diabetes mellitus without complication (HCC)    Hyperlipidemia    Hypertension    MRSA (methicillin resistant Staphylococcus aureus) infection 1992   history   Myocardial infarction ([place]) 1995   Sleep apnea    CPAP   Urothelial cancer ([place]) 11/2017   Left Urothelial mass, chemo tx's    SOCIAL HX:  Social History   Tobacco Use   Smoking status: Former Smoker    Types: Cigars    Quit date: 10/09/1978    Years since quitting: 41.5   Smokeless tobacco: Former Systems developer    Types: Chew    Quit date: 12/04/1988   Tobacco comment: on  occasion  Substance Use Topics   Alcohol use: Not Currently    ALLERGIES:  Allergies  Allergen Reactions   Sulfa Antibiotics Other (See Comments)    Joint pain   Ace Inhibitors Cough   Invokana [Canagliflozin] Other (See Comments)    Leg pain   Penicillins Rash    Did it involve swelling of the face/tongue/throat, SOB, or low BP? No Did it involve sudden or severe rash/hives, skin peeling, or any reaction on the inside of your mouth or nose? Yes Did you need to seek medical attention at a hospital or doctor's office? Yes When did it  last happen?childhood allergy If all above answers are "NO", may proceed with cephalosporin use.      PERTINENT MEDICATIONS:  Outpatient Encounter Medications as of 04/01/2020  Medication Sig   aspirin EC 81 MG tablet Take 81 mg by mouth every evening.    atorvastatin (LIPITOR) 40 MG tablet TAKE 1 TABLET BY MOUTH AT BEDTIME   Cholecalciferol (VITAMIN D3) 25 MCG (1000 UT) CAPS Take 1 capsule by mouth daily.   clobetasol cream (TEMOVATE) 1.60 % Apply 1 application topically as needed (for skin rash).   Continuous Blood Gluc Receiver ([place]) DEVI 1 each by Other route continuous. Dx Code: E11.9   Continuous Blood Gluc Sensor (DEXCOM G6 SENSOR) MISC 1 each by Does not apply route continuous. Dx Code: E11.9   Continuous Blood Gluc Transmit (DEXCOM G6 TRANSMITTER) MISC 1 each by Does not apply route continuous. Dx Code: E11.9   empagliflozin (JARDIANCE) 10 MG TABS tablet Take 1 tablet (10 mg total) by mouth daily.   fluticasone (FLONASE) 50 MCG/ACT nasal spray Use 2 spray(s) in each nostril once daily (Patient not taking: Reported on 11/04/2019)   glucose blood (CONTOUR NEXT TEST) test strip 1 each by Other route in the morning and at bedtime. USE 1 STRIP TO CHECK GLUCOSE ONCE DAILY   hydrOXYzine (ATARAX/VISTARIL) 25 MG tablet Take 1 tablet (25 mg total) by mouth every 8 (eight) hours as needed for itching. (Patient not taking: Reported on 11/04/2019)   Ivermectin (SOOLANTRA) 1 % CREA Apply 1 application topically daily as needed (rosacea). To face (Patient not taking: Reported on 03/09/2020)   lidocaine-prilocaine (EMLA) cream Apply 1 application topically as needed (port access). (Patient not taking: Reported on 03/09/2020)   losartan (COZAAR) 50 MG tablet Take 1 tablet (50 mg total) by mouth daily.   magic mouthwash w/lidocaine SOLN Take 5 mLs by mouth 4 (four) times daily as needed for mouth pain. (Patient not taking: Reported on 12/16/2019)   magnesium hydroxide (MILK  OF MAGNESIA) 400 MG/5ML suspension Take 15 mLs by mouth daily as needed for mild constipation.   metFORMIN (GLUCOPHAGE) 1000 MG tablet TAKE 1 TABLET BY MOUTH TWICE DAILY WITH MEALS   nystatin cream (MYCOSTATIN) Apply topically 2 (two) times daily. (Patient not taking: Reported on 01/22/2020)   OVER THE COUNTER MEDICATION Apply 1 application topically daily as needed (pain). Outback topical pain relief (Patient not taking: Reported on 03/09/2020)   pregabalin (LYRICA) 75 MG capsule Take 1 capsule (75 mg total) by mouth daily.   sertraline (ZOLOFT) 100 MG tablet Take 1 tablet by mouth once daily   vitamin B-12 (CYANOCOBALAMIN) 1000 MCG tablet Take by mouth daily. Unsure dose   vitamin C (ASCORBIC ACID) 500 MG tablet Take 1,000-1,500 mg by mouth daily as needed (immune support). (Patient not taking: Reported on 01/22/2020)   Facility-Administered Encounter Medications as of 04/01/2020  Medication  Influenza vac split quadrivalent PF (FLUZONE HIGH-DOSE) injection 0.5 mL      Lee Kalt Jenetta Downer, NP

## 2020-04-05 ENCOUNTER — Other Ambulatory Visit: Payer: Self-pay

## 2020-04-05 ENCOUNTER — Telehealth: Payer: Self-pay | Admitting: Family Medicine

## 2020-04-05 DIAGNOSIS — E119 Type 2 diabetes mellitus without complications: Secondary | ICD-10-CM

## 2020-04-05 NOTE — Telephone Encounter (Signed)
Pt request refill  glucose blood (CONTOUR NEXT TEST) test strip  Pt used to be on insulin, but is not anymore.  Medicare will only pay for 1 X a day testing. Pt needs new Rx sent to [place], [place] Phone:  [phone]  Fax:  [phone]

## 2020-04-06 ENCOUNTER — Inpatient Hospital Stay: Payer: Medicare Other | Attending: Oncology

## 2020-04-06 ENCOUNTER — Inpatient Hospital Stay (HOSPITAL_BASED_OUTPATIENT_CLINIC_OR_DEPARTMENT_OTHER): Payer: Medicare Other | Admitting: Oncology

## 2020-04-06 ENCOUNTER — Encounter: Payer: Self-pay | Admitting: Oncology

## 2020-04-06 ENCOUNTER — Inpatient Hospital Stay: Payer: Medicare Other

## 2020-04-06 ENCOUNTER — Other Ambulatory Visit: Payer: Self-pay

## 2020-04-06 VITALS — BP 105/81 | HR 70 | Resp 18

## 2020-04-06 VITALS — BP 96/64 | HR 80 | Temp 96.3°F | Resp 16 | Wt 156.1 lb

## 2020-04-06 DIAGNOSIS — Z5112 Encounter for antineoplastic immunotherapy: Secondary | ICD-10-CM | POA: Diagnosis not present

## 2020-04-06 DIAGNOSIS — C642 Malignant neoplasm of left kidney, except renal pelvis: Secondary | ICD-10-CM | POA: Diagnosis not present

## 2020-04-06 DIAGNOSIS — Z5111 Encounter for antineoplastic chemotherapy: Secondary | ICD-10-CM

## 2020-04-06 DIAGNOSIS — G620 Drug-induced polyneuropathy: Secondary | ICD-10-CM | POA: Diagnosis not present

## 2020-04-06 DIAGNOSIS — C778 Secondary and unspecified malignant neoplasm of lymph nodes of multiple regions: Secondary | ICD-10-CM | POA: Insufficient documentation

## 2020-04-06 DIAGNOSIS — C689 Malignant neoplasm of urinary organ, unspecified: Secondary | ICD-10-CM | POA: Diagnosis not present

## 2020-04-06 DIAGNOSIS — I2510 Atherosclerotic heart disease of native coronary artery without angina pectoris: Secondary | ICD-10-CM | POA: Diagnosis not present

## 2020-04-06 DIAGNOSIS — Z95828 Presence of other vascular implants and grafts: Secondary | ICD-10-CM

## 2020-04-06 DIAGNOSIS — T451X5A Adverse effect of antineoplastic and immunosuppressive drugs, initial encounter: Secondary | ICD-10-CM

## 2020-04-06 DIAGNOSIS — D701 Agranulocytosis secondary to cancer chemotherapy: Secondary | ICD-10-CM

## 2020-04-06 LAB — COMPREHENSIVE METABOLIC PANEL
ALT: 15 U/L (ref 0–44)
AST: 19 U/L (ref 15–41)
Albumin: 4.1 g/dL (ref 3.5–5.0)
Alkaline Phosphatase: 88 U/L (ref 38–126)
Anion gap: 11 (ref 5–15)
BUN: 18 mg/dL (ref 8–23)
CO2: 26 mmol/L (ref 22–32)
Calcium: 9.7 mg/dL (ref 8.9–10.3)
Chloride: 97 mmol/L — ABNORMAL LOW (ref 98–111)
Creatinine, Ser: 0.86 mg/dL (ref 0.61–1.24)
GFR calc Af Amer: >60 mL/min (ref >60)
GFR calc non Af Amer: >60 mL/min (ref >60)
Glucose, Bld: 199 mg/dL — ABNORMAL HIGH (ref 70–99)
Potassium: 4.6 mmol/L (ref 3.5–5.1)
Sodium: 134 mmol/L — ABNORMAL LOW (ref 135–145)
Total Bilirubin: 1.3 mg/dL — ABNORMAL HIGH (ref 0.3–1.2)
Total Protein: 7.4 g/dL (ref 6.5–8.1)

## 2020-04-06 LAB — CBC WITH DIFFERENTIAL/PLATELET
Abs Immature Granulocytes: 0.02 10*3/uL (ref 0.00–0.07)
Basophils Absolute: 0.1 10*3/uL (ref 0.0–0.1)
Basophils Relative: 1 %
Eosinophils Absolute: 0.3 10*3/uL (ref 0.0–0.5)
Eosinophils Relative: 4 %
HCT: 38.1 % — ABNORMAL LOW (ref 39.0–52.0)
Hemoglobin: 13.1 g/dL (ref 13.0–17.0)
Immature Granulocytes: 0 %
Lymphocytes Relative: 22 %
Lymphs Abs: 1.5 10*3/uL (ref 0.7–4.0)
MCH: 29.4 pg (ref 26.0–34.0)
MCHC: 34.4 g/dL (ref 30.0–36.0)
MCV: 85.6 fL (ref 80.0–100.0)
Monocytes Absolute: 0.6 10*3/uL (ref 0.1–1.0)
Monocytes Relative: 8 %
Neutro Abs: 4.5 10*3/uL (ref 1.7–7.7)
Neutrophils Relative %: 65 %
Platelets: 235 10*3/uL (ref 150–400)
RBC: 4.45 MIL/uL (ref 4.22–5.81)
RDW: 14.3 % (ref 11.5–15.5)
WBC: 7 10*3/uL (ref 4.0–10.5)
nRBC: 0 % (ref 0.0–0.2)

## 2020-04-06 MED ORDER — SODIUM CHLORIDE 0.9 % IV SOLN
Freq: Once | INTRAVENOUS | Status: AC
  Filled 2020-04-06: qty 250

## 2020-04-06 MED ORDER — HEPARIN SOD (PORK) LOCK FLUSH 100 UNIT/ML IV SOLN
500.0000 [IU] | Freq: Once | INTRAVENOUS | Status: AC
  Administered 2020-04-06: 500 [IU] via INTRAVENOUS
  Filled 2020-04-06: qty 5

## 2020-04-06 MED ORDER — HEPARIN SOD (PORK) LOCK FLUSH 100 UNIT/ML IV SOLN
INTRAVENOUS | Status: AC
  Filled 2020-04-06: qty 5

## 2020-04-06 MED ORDER — SODIUM CHLORIDE 0.9% FLUSH
10.0000 mL | Freq: Once | INTRAVENOUS | Status: AC
  Administered 2020-04-06: 10 mL via INTRAVENOUS
  Filled 2020-04-06: qty 10

## 2020-04-06 MED ORDER — PALONOSETRON HCL INJECTION 0.25 MG/5ML
0.2500 mg | Freq: Once | INTRAVENOUS | Status: AC
  Administered 2020-04-06: 0.25 mg via INTRAVENOUS
  Filled 2020-04-06: qty 5

## 2020-04-06 MED ORDER — SODIUM CHLORIDE 0.9 % IV SOLN
1.0000 mg/kg | Freq: Once | INTRAVENOUS | Status: AC
  Administered 2020-04-06: 70 mg via INTRAVENOUS
  Filled 2020-04-06: qty 3

## 2020-04-07 MED ORDER — CONTOUR NEXT TEST VI STRP: 1.0000 | ORAL_STRIP | Freq: Every day | 3 refills | Status: DC

## 2020-04-07 NOTE — Telephone Encounter (Signed)
Refill sent to patients pharmacy. 

## 2020-04-08 ENCOUNTER — Encounter: Payer: Self-pay | Admitting: Oncology

## 2020-04-12 ENCOUNTER — Inpatient Hospital Stay: Payer: Medicare Other | Attending: Hospice and Palliative Medicine | Admitting: Hospice and Palliative Medicine

## 2020-04-12 ENCOUNTER — Encounter: Payer: Self-pay | Admitting: Hospice and Palliative Medicine

## 2020-04-12 DIAGNOSIS — Z515 Encounter for palliative care: Secondary | ICD-10-CM | POA: Diagnosis not present

## 2020-04-12 DIAGNOSIS — Z5112 Encounter for antineoplastic immunotherapy: Secondary | ICD-10-CM | POA: Insufficient documentation

## 2020-04-12 DIAGNOSIS — I2510 Atherosclerotic heart disease of native coronary artery without angina pectoris: Secondary | ICD-10-CM

## 2020-04-12 DIAGNOSIS — C689 Malignant neoplasm of urinary organ, unspecified: Secondary | ICD-10-CM

## 2020-04-12 DIAGNOSIS — C778 Secondary and unspecified malignant neoplasm of lymph nodes of multiple regions: Secondary | ICD-10-CM | POA: Insufficient documentation

## 2020-04-12 DIAGNOSIS — C642 Malignant neoplasm of left kidney, except renal pelvis: Secondary | ICD-10-CM | POA: Insufficient documentation

## 2020-04-12 NOTE — Progress Notes (Signed)
Patient states he believes to have Blood in urine. Patient states that when he drinks water he doesn't see the red tint in urine.Patient also states that he thinks when After drinking cherry juice he see in red tint in urine.

## 2020-04-12 NOTE — Progress Notes (Signed)
Virtual Visit via Video Note  I connected with Patrick Mcgee on 04/12/20 at 10:30 AM EDT by a video enabled telemedicine application and verified that I am speaking with the correct person using two identifiers.   I discussed the limitations of evaluation and management by telemedicine and the availability of in person appointments. The patient expressed understanding and agreed to proceed.  History of Present Illness: Patrick Mcgee is a 74 y.o.malewith multiple medical problems including metastatic urothelial carcinoma originally diagnosed in May 2019 status post chemotherapy and immunotherapy. PMH is also notable for mild interstitial lung disease, diabetes, and CAD status post previous stenting.  He stands in April 2021 revealed upper urothelial mass but no distant metastatic disease.  Patient is deciding on whether he wants to pursue further treatment. Patient was referred to palliative care to help address goals and manage ongoing symptoms.   Observations/Objective: Patient reports that overall he is doing well.  He says that his neuropathy is significantly improved on Lyrica.  Patient endorses intermittent pink-tinged urine, which he attributed to eating a lot of cherries.  I suspect he could have hematuria.  However, he currently denies discolored urine.  No fever or chills.  No urgency, frequency, or burning.  Patient said that he wants to continue monitoring and will let us know if symptoms return.  Patient had several questions regarding Covid vaccine booster.  Discussed in detail.  Assessment and Plan: Stage IV urothelial cancer -on treatment with Padcev. Followed by Dr. Janese Banks  Follow Up Instructions: Follow-up MyChart visit in 1 to 2 months   I discussed the assessment and treatment plan with the patient. The patient was provided an opportunity to ask questions and all were answered. The patient agreed with the plan and demonstrated an understanding of the instructions.    The patient was advised to call back or seek an in-person evaluation if the symptoms worsen or if the condition fails to improve as anticipated.  I provided 15 minutes of non-face-to-face time during this encounter.   Irean Hong, NP

## 2020-04-12 NOTE — Progress Notes (Signed)
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 [name] so far for his metastatic urothelial carcinoma. This was originally diagnosed in May 2019. He was noted to have a filling defect in the lower pole collecting system of the left kidney along with para-aortic and retroperitoneal adenopathy concerning for metastatic disease. He underwent left ureteroscopy and renal pelvis biopsy which revealed small fragments of high-grade urothelial carcinoma with small focus of invasion. He was started on carboplatin and gemcitabine chemotherapy in June 2019 and was continued on 05/27/2018 for 8 cycles. He tolerated chemotherapy well except for chemo-induced anemia for which she has been getting Procrit every 2 weeks. He did have response to his disease based on scans in August 2019. However repeat scan on 05/31/2018 showed increase in the size of the primary tumor from 1.2 to 1.9 cm and increase in left para-aortic adenopathy from 0.9 to 1.3 cm. Periportal adenopathy was stable at 1.4 cm. Second line immunotherapy was recommended. His initial biopsy specimen did not have enough sample to undergo FGFR mutation testing. He also has mild interstitial lung disease for which he sees pulmonary but he is not on home oxygen. He has B12 deficiency for which he is  on oral B12. Also has diabetes and coronary artery disease.Tecentriq started on 06/17/2018.Patient noted to have disease progression in his lymph nodes in May 2020. Repeat biopsy showed metastatic urothelial carcinoma which did not have aFGFR mutation. He has been started on third line Padcev  Treatment on hold since April 2021 due to neuropathy and to maintain quality of life but restarted in August 2021 after he was found to have progression of disease   Interval history-reports his neuropathy is stable.  He does have chronic fatigue although he is still independent of his ADLs.  Weight has remained stable.  ECOG PS- 1 Pain scale- 0   Review of systems- Review of Systems  Constitutional: Negative for chills, fever, malaise/fatigue and weight loss.  HENT: Negative for congestion, ear discharge and nosebleeds.   Eyes: Negative for blurred vision.  Respiratory: Negative for cough, hemoptysis, sputum production, shortness of breath and wheezing.   Cardiovascular: Negative for chest pain, palpitations, orthopnea and claudication.  Gastrointestinal: Negative for abdominal pain, blood in stool, constipation, diarrhea, heartburn, melena, nausea and vomiting.  Genitourinary: Negative for dysuria, flank pain, frequency, hematuria and urgency.  Musculoskeletal: Negative for back pain, joint pain and myalgias.  Skin: Negative for rash.  Neurological: Negative for dizziness, tingling, focal weakness, seizures, weakness and headaches.  Endo/Heme/Allergies: Does not bruise/bleed easily.  Psychiatric/Behavioral: Negative for depression and suicidal ideas. The patient does not have insomnia.       Allergies  Allergen Reactions  . Sulfa Antibiotics Other (See Comments)    Joint pain  . Ace Inhibitors Cough  . Invokana [Canagliflozin] Other (See Comments)    Leg pain  . Penicillins Rash    Did it involve swelling of the face/tongue/throat,  SOB, or low BP? No Did it involve sudden or  severe rash/hives, skin peeling, or any reaction on the inside of your mouth or nose? Yes Did you need to seek medical attention at a hospital or doctor's office? Yes When did it last happen?childhood allergy If all above answers are "NO", may proceed with cephalosporin use.      Past Medical History:  Diagnosis Date  . Acid reflux   . Anxiety   . Depression   . Diabetes mellitus without complication ([place])   . Hyperlipidemia   . Hypertension   . MRSA (methicillin resistant Staphylococcus aureus) infection 1992   history  . Myocardial infarction ([place]) 1995  . Sleep apnea    CPAP  . Urothelial cancer ([place]) 11/2017   Left Urothelial mass, chemo tx's     Past Surgical History:  Procedure Laterality Date  . CATARACT EXTRACTION Left   . COLONOSCOPY  2010   [facility]  . COLONOSCOPY WITH PROPOFOL N/A 10/04/2016   Procedure: COLONOSCOPY WITH PROPOFOL;  Surgeon: [name], MD;  Location[facility] ENDOSCOPY;  Service: Endoscopy;  Laterality: N/A;  . [place], 2000, 2001, 2014  . CYSTOSCOPY W/ RETROGRADES Bilateral 12/10/2017   Procedure: CYSTOSCOPY WITH RETROGRADE PYELOGRAM;  Surgeon: [name], MD;  Location: [facility] ORS;  Service: Urology;  Laterality: Bilateral;  . CYSTOSCOPY W/ RETROGRADES Left 12/09/2018   Procedure: CYSTOSCOPY WITH RETROGRADE PYELOGRAM;  Surgeon: [name], MD;  Location: [facility] ORS;  Service: Urology;  Laterality: Left;  . CYSTOSCOPY WITH BIOPSY Left 12/09/2018   Procedure: CYSTOSCOPY WITH URETERAL/RENAL PELVIC BIOPSY;  Surgeon: [name], MD;  Location: [facility] ORS;  Service: Urology;  Laterality: Left;  . CYSTOSCOPY WITH STENT PLACEMENT Left 12/10/2017   Procedure: CYSTOSCOPY WITH STENT PLACEMENT;  Surgeon: [name], MD;  Location: [facility] ORS;  Service: Urology;  Laterality: Left;  . CYSTOSCOPY WITH STENT PLACEMENT Left 12/09/2018   Procedure: CYSTOSCOPY WITH STENT PLACEMENT;  Surgeon: [name], MD;   Location: [facility] ORS;  Service: Urology;  Laterality: Left;  . CYSTOSCOPY WITH URETEROSCOPY Left 12/09/2018   Procedure: CYSTOSCOPY WITH URETEROSCOPY;  Surgeon: [name], MD;  Location: [facility] ORS;  Service: Urology;  Laterality: Left;  . EYE SURGERY Bilateral    cataract  . INGUINAL HERNIA REPAIR Right 08/09/2015   Procedure: HERNIA REPAIR INGUINAL ADULT;  Surgeon: [name], MD;  Location: [facility] ORS;  Service: General;  Laterality: Right;  . NASAL SINUS SURGERY    . nuclear stress test    . PORTA CATH INSERTION N/A 12/19/2017   Procedure: PORTA CATH INSERTION;  Surgeon: [name], MD;  Location: [facility];  Service: Cardiovascular;  Laterality: N/A;  . URETERAL BIOPSY Left 12/10/2017   Procedure: URETERAL & renal PELVIS BIOPSY;  Surgeon: [name], MD;  Location: [facility] ORS;  Service: Urology;  Laterality: Left;  . URETEROSCOPY Left 12/10/2017   Procedure: URETEROSCOPY;  Surgeon: [name], MD;  Location: [facility] ORS;  Service: Urology;  Laterality: Left;    Social History   Socioeconomic History  . Marital status: Married    Spouse name: [name]  . Number of children: 1  . Years of education: Not on file  . Highest education level: Associate degree: occupational, Hotel manager, or vocational program  Occupational History  . Occupation: retired  Tobacco Use  . Smoking status: Former Smoker    Types: Cigars    Quit date: 10/09/1978    Years since quitting: 41.5  .  Smokeless tobacco: Former Systems developer    Types: Chew    Quit date: 12/04/1988  . Tobacco comment: on occasion  Vaping Use  . Vaping Use: Never used  Substance and Sexual Activity  . Alcohol use: Not Currently  . Drug use: No  . Sexual activity: Not Currently  Other Topics Concern  . Not on file  Social History Narrative  . Not on file   Social Determinants of Health   Financial Resource Strain: Low Risk   . Difficulty of Paying Living Expenses: Not hard at all  Food Insecurity: No Food Insecurity    . Worried About Charity fundraiser in the Last Year: Never true  . Ran Out of Food in the Last Year: Never true  Transportation Needs: No Transportation Needs  . Lack of Transportation (Medical): No  . Lack of Transportation (Non-Medical): No  Physical Activity: Insufficiently Active  . Days of Exercise per Week: 3 days  . Minutes of Exercise per Session: 30 min  Stress: No Stress Concern Present  . Feeling of Stress : Not at all  Social Connections: Moderately Integrated  . Frequency of Communication with Friends and Family: Three times a week  . Frequency of Social Gatherings with Friends and Family: More than three times a week  . Attends Religious Services: More than 4 times per year  . Active Member of Clubs or Organizations: No  . Attends Archivist Meetings: Never  . Marital Status: Married  Human resources officer Violence: Not At Risk  . Fear of Current or Ex-Partner: No  . Emotionally Abused: No  . Physically Abused: No  . Sexually Abused: No    Family History  Problem Relation Age of Onset  . Heart disease Mother   . Cancer Father        Lung and colon cancer  . Heart disease Father   . Emphysema Maternal Grandfather   . Tuberculosis Maternal Grandmother      Current Outpatient Medications:  .  aspirin EC 81 MG tablet, Take 81 mg by mouth every evening. , Disp: , Rfl:  .  atorvastatin (LIPITOR) 40 MG tablet, TAKE 1 TABLET BY MOUTH AT BEDTIME, Disp: 90 tablet, Rfl: 4 .  Cholecalciferol (VITAMIN D3) 25 MCG (1000 UT) CAPS, Take 1 capsule by mouth daily., Disp: , Rfl:  .  clobetasol cream (TEMOVATE) 6.22 %, Apply 1 application topically as needed (for skin rash)., Disp: 30 g, Rfl: 1 .  empagliflozin (JARDIANCE) 10 MG TABS tablet, Take 1 tablet (10 mg total) by mouth daily., Disp: 90 tablet, Rfl: 2 .  magnesium hydroxide (MILK OF MAGNESIA) 400 MG/5ML suspension, Take 15 mLs by mouth daily as needed for mild constipation., Disp: , Rfl:  .  metFORMIN (GLUCOPHAGE)  1000 MG tablet, TAKE 1 TABLET BY MOUTH TWICE DAILY WITH MEALS, Disp: 180 tablet, Rfl: 0 .  pregabalin (LYRICA) 75 MG capsule, Take 1 capsule (75 mg total) by mouth daily., Disp: 30 capsule, Rfl: 1 .  sertraline (ZOLOFT) 100 MG tablet, Take 1 tablet by mouth once daily, Disp: 90 tablet, Rfl: 1 .  vitamin B-12 (CYANOCOBALAMIN) 1000 MCG tablet, Take by mouth daily. Unsure dose, Disp: , Rfl:  .  Continuous Blood Gluc Receiver ([place]) DEVI, 1 each by Other route continuous. Dx Code: E11.9 (Patient not taking: Reported on 04/05/2020), Disp: 1 each, Rfl: 0 .  Continuous Blood Gluc Sensor (DEXCOM G6 SENSOR) MISC, 1 each by Does not apply route continuous. Dx Code: E11.9 (Patient  not taking: Reported on 04/05/2020), Disp: 1 each, Rfl: 0 .  Continuous Blood Gluc Transmit (DEXCOM G6 TRANSMITTER) MISC, 1 each by Does not apply route continuous. Dx Code: E11.9 (Patient not taking: Reported on 04/05/2020), Disp: 1 each, Rfl: 0 .  fluticasone (FLONASE) 50 MCG/ACT nasal spray, Use 2 spray(s) in each nostril once daily (Patient not taking: Reported on 11/04/2019), Disp: 48 g, Rfl: 3 .  glucose blood (CONTOUR NEXT TEST) test strip, 1 each by Other route daily. USE 1 STRIP TO CHECK GLUCOSE ONCE DAILY, Disp: 100 each, Rfl: 3 .  hydrOXYzine (ATARAX/VISTARIL) 25 MG tablet, Take 1 tablet (25 mg total) by mouth every 8 (eight) hours as needed for itching. (Patient not taking: Reported on 11/04/2019), Disp: 30 tablet, Rfl: 3 .  Ivermectin (SOOLANTRA) 1 % CREA, Apply 1 application topically daily as needed (rosacea). To face (Patient not taking: Reported on 03/09/2020), Disp: , Rfl:  .  lidocaine-prilocaine (EMLA) cream, Apply 1 application topically as needed (port access). (Patient not taking: Reported on 03/09/2020), Disp: 1 g, Rfl: 3 .  losartan (COZAAR) 50 MG tablet, Take 1 tablet (50 mg total) by mouth daily. (Patient not taking: Reported on 04/06/2020), Disp: 90 tablet, Rfl: 3 .  magic mouthwash w/lidocaine SOLN,  Take 5 mLs by mouth 4 (four) times daily as needed for mouth pain. (Patient not taking: Reported on 12/16/2019), Disp: 480 mL, Rfl: 3 .  nystatin cream (MYCOSTATIN), Apply topically 2 (two) times daily. (Patient not taking: Reported on 01/22/2020), Disp: 15 g, Rfl: 1 .  OVER THE COUNTER MEDICATION, Apply 1 application topically daily as needed (pain). [place]back topical pain relief (Patient not taking: Reported on 03/09/2020), Disp: , Rfl:  .  vitamin C (ASCORBIC ACID) 500 MG tablet, Take 1,000-1,500 mg by mouth daily as needed (immune support). (Patient not taking: Reported on 01/22/2020), Disp: , Rfl:  No current facility-administered medications for this visit.  Facility-Administered Medications Ordered in Other Visits:  .  Influenza vac split quadrivalent PF (FLUZONE HIGH-DOSE) injection 0.5 mL, 0.5 mL, Intramuscular, Once, Karen Kitchens, NP  Physical exam:  Vitals:   04/06/20 1323  BP: 96/64  Pulse: 80  Resp: 16  Temp: (!) 96.3 F (35.7 C)  TempSrc: Tympanic  SpO2: 97%  Weight: 156 lb 1.6 oz (70.8 kg)   Physical Exam Constitutional:      General: He is not in acute distress. Cardiovascular:     Rate and Rhythm: Normal rate and regular rhythm.     Heart sounds: Normal heart sounds.  Pulmonary:     Effort: Pulmonary effort is normal.     Breath sounds: Normal breath sounds.  Abdominal:     General: Bowel sounds are normal.     Palpations: Abdomen is soft.  Skin:    General: Skin is warm and dry.  Neurological:     Mental Status: He is alert and oriented to person, place, and time.      CMP Latest Ref Rng & Units 04/06/2020  Glucose 70 - 99 mg/dL 199(H)  BUN 8 - 23 mg/dL 18  Creatinine 0.61 - 1.24 mg/dL 0.86  Sodium 135 - 145 mmol/L 134(L)  Potassium 3.5 - 5.1 mmol/L 4.6  Chloride 98 - 111 mmol/L 97(L)  CO2 22 - 32 mmol/L 26  Calcium 8.9 - 10.3 mg/dL 9.7  Total Protein 6.5 - 8.1 g/dL 7.4  Total Bilirubin 0.3 - 1.2 mg/dL 1.3(H)  Alkaline Phos 38 - 126 U/L 88  AST 15 - 41  U/L  19  ALT 0 - 44 U/L 15   CBC Latest Ref Rng & Units 04/06/2020  WBC 4.0 - 10.5 K/uL 7.0  Hemoglobin 13.0 - 17.0 g/dL 13.1  Hematocrit 39 - 52 % 38.1(L)  Platelets 150 - 400 K/uL 235      Assessment and plan- Patient is a 74 y.o. 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  We are doing Padcev at reduced dose of 1 mg/kg after 1 week on 3 weeks of as patient did develop foot drop on Padcev and we have been able to keep his disease under control with this regimen.  His recent scans showed disease progression after he stopped taking Padcev for 5 months.  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 Encounter for antineoplastic chemotherapy   2. Chemotherapy-induced peripheral neuropathy ([place])   3. Urothelial cancer ([place])      Dr. Randa Evens, MD, MPH [facility] [phone] 04/12/2020 8:25 AM

## 2020-04-15 ENCOUNTER — Ambulatory Visit: Payer: Medicare Other | Admitting: Urology

## 2020-04-19 ENCOUNTER — Ambulatory Visit: Payer: Self-pay | Admitting: Family Medicine

## 2020-04-22 ENCOUNTER — Ambulatory Visit: Payer: Medicare Other | Admitting: Urology

## 2020-04-26 NOTE — Progress Notes (Signed)
04/27/2020 2:26 PM   Patrick Mcgee [birth year] [id number]  Referring provider: Jerrol Banana., MD [address] [employer] [place],  [place] [address] Chief Complaint  Patient presents with  . Hematuria    Follow up    HPI: Patrick Mcgee is a 74 y.o. male who presents today for management of hematuria.   Please review previous notes for details.   Recent CT A/P w/ contrast on 01/21/2020 noted soft tissue mass in the intrarenal collecting system of the lower pole left kidney, consistent with recurrent disease. No metastatic adenopathy. Right renal stone. Cholelithiasis. Peripheral subpleural pulmonary parenchymal reticulation can be seen with usual interstitial pneumonitis or nonspecific interstitial pneumonitis.  Recent hemoglobin was stable at 13.1 on 04/06/2020.   He has had minimal intermittent gross hematuria. Hematuria occurs when he eats red foods like strawberry and beats. Denies blood clots in urine. He has urgency and frequency. Has has some leakage if he does not get up immediately. Patient states some lower pelvic discomfort and pain. His lower urinary tract symptoms are not bothersome.  He is drinking a lot of water and coffee. He notes constipation and is on a daily stool softener. He feels constipation and dehydration is related to his treatment.   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 Janese Banks. He was diagnosed in 11/2017. He is currently undergoing cycles of Padcev.   He has a history of CAD, on aspirin.    PMH: Past Medical History:  Diagnosis Date  . Acid reflux   . Anxiety   . Depression   . Diabetes mellitus without complication ([place])   . Hyperlipidemia   . Hypertension   . MRSA (methicillin resistant Staphylococcus aureus) infection 1992   history  . Myocardial infarction ([place]) 1995  . Sleep apnea    CPAP  . Urothelial cancer ([facility]) 11/2017   Left  Urothelial mass, chemo tx's    Surgical History: Past Surgical History:  Procedure Laterality Date  . CATARACT EXTRACTION Left   . COLONOSCOPY  2010   [facility]  . COLONOSCOPY WITH PROPOFOL N/A 10/04/2016   Procedure: COLONOSCOPY WITH PROPOFOL;  Surgeon: [name], MD;  Location: [facility] END[facility]COPY;  Service: Endoscopy;  Laterality: N/A;  . [place], 2000, 2001, 2014  . CYSTOSCOPY W/ RETROGRADES Bilateral 12/10/2017   Procedure: CYSTOSCOPY WITH RETROGRADE PYELOGRAM;  Surgeon: [name], MD;  Location: [facility] ORS;  Service: Urology;  Laterality: Bilateral;  . CYSTOSCOPY W/ RETROGRADES Left 12/09/2018   Procedure: CYSTOSCOPY WITH RETROGRADE PYELOGRAM;  Surgeon: [name], MD;  Location: [facility] ORS;  Service: Urology;  Laterality: Left;  . CYSTOSCOPY WITH BIOPSY Left 12/09/2018   Procedure: CYSTOSCOPY WITH URETERAL/RENAL PELVIC BIOPSY;  Surgeon: [name], MD;  Location: [facility] ORS;  Service: Urology;  Laterality: Left;  . CYSTOSCOPY WITH STENT PLACEMENT Left 12/10/2017   Procedure: CYSTOSCOPY WITH STENT PLACEMENT;  Surgeon: [name], MD;  Location: [facility] ORS;  Service: Urology;  Laterality: Left;  . CYSTOSCOPY WITH STENT PLACEMENT Left 12/09/2018   Procedure: CYSTOSCOPY WITH STENT PLACEMENT;  Surgeon: [name], MD;  Location: [facility] OR[facility];  Service: Urology;  Laterality: Left;  . CYSTOSCOPY WITH URETEROSCOPY Left 12/09/2018   Procedure: CYSTOSCOPY WITH URETEROSCOPY;  Surgeon: [name], MD;  Location: [facility] ORS;  Service: Urology;  Laterality: Left;  . EYE SURGERY Bilateral    cataract  . INGUINAL HERNIA REPAIR Right 08/09/2015  Procedure: HERNIA REPAIR INGUINAL ADULT;  Surgeon: [name], MD;  Location: [facility] ORS;  Service: General;  Laterality: Right;  . NASAL SINUS SURGERY    . nuclear stress test    . PORTA CATH INSERTION N/A 12/19/2017   Procedure: PORTA CATH INSERTION;  Surgeon: [name], MD;  Location: [facility];  Service: Cardiovascular;  Laterality: N/A;  . URETERAL BIOPSY Left 12/10/2017   Procedure: URETERAL & renal PELVIS BIOPSY;  Surgeon: [name], MD;  Location: [facility] OR[facility];  Service: Urology;  Laterality: Left;  . URETEROSCOPY Left 12/10/2017   Procedure: URETEROSCOPY;  Surgeon: [name], MD;  Location: [facility] OR[facility];  Service: Urology;  Laterality: Left;    Home Medications:  Allergies as of 04/27/2020      Reactions   Sulfa Antibiotics Other (See Comments)   Joint pain   Ace Inhibitors Cough   Invokana [canagliflozin] Other (See Comments)   Leg pain   Penicillins Rash   Did it involve swelling of the face/tongue/throat, SOB, or low BP? No Did it involve sudden or severe rash/hives, skin peeling, or any reaction on the inside of your mouth or nose? Yes Did you need to seek medical attention at a hospital or doctor's office? Yes When did it last happen?childhood allergy If all above answers are "NO", may proceed with cephalosporin use.      Medication List       Accurate as of April 27, 2020 11:59 PM. If you have any questions, ask your nurse or doctor.        STOP taking these medications   Dexcom G6 Receiver Devi Stopped by: [name], MD   Dexcom G6 Sensor Misc Stopped by: [name], MD   Dexcom G6 Transmitter Misc Stopped by: [name], MD     TAKE these medications   aspirin EC 81 MG tablet Take 81 mg by mouth every evening.   atorvastatin 40 MG tablet Commonly known as: LIPITOR TAKE 1 TABLET BY MOUTH AT BEDTIME   clobetasol cream 0.05 % Commonly known as: TEMOVATE Apply 1 application topically as needed (for skin rash).   Contour Next Test test strip Generic drug: glucose blood 1 each by Other route daily. USE 1 STRIP TO CHECK GLUCOSE ONCE DAILY   empagliflozin 10 MG Tabs tablet Commonly known as: Jardiance Take 1 tablet (10 mg total) by mouth daily.   fluticasone 50 MCG/ACT nasal spray Commonly known as: FLONASE Use  2 spray(s) in each nostril once daily   hydrOXYzine 25 MG tablet Commonly known as: ATARAX/VISTARIL Take 1 tablet (25 mg total) by mouth every 8 (eight) hours as needed for itching.   lidocaine-prilocaine cream Commonly known as: EMLA Apply 1 application topically as needed (port access).   losartan 50 MG tablet Commonly known as: COZAAR Take 1 tablet (50 mg total) by mouth daily.   magic mouthwash w/lidocaine Soln Take 5 mLs by mouth 4 (four) times daily as needed for mouth pain.   magnesium hydroxide 400 MG/5ML suspension Commonly known as: MILK OF MAGNESIA Take 15 mLs by mouth daily as needed for mild constipation.   metFORMIN 1000 MG tablet Commonly known as: GLUCOPHAGE TAKE 1 TABLET BY MOUTH TWICE DAILY WITH MEALS   nystatin cream Commonly known as: MYCOSTATIN Apply topically 2 (two) times daily.   OVER THE COUNTER MEDICATION Apply 1 application topically daily as needed (pain). Outback topical pain relief   pregabalin 75 MG capsule Commonly known as: Lyrica Take 1 capsule (75  mg total) by mouth daily.   sertraline 100 MG tablet Commonly known as: ZOLOFT Take 1 tablet by mouth once daily   Soolantra 1 % Crea Generic drug: Ivermectin Apply 1 application topically daily as needed (rosacea). To face   vitamin B-12 1000 MCG tablet Commonly known as: CYANOCOBALAMIN Take by mouth daily. Unsure dose   vitamin C 500 MG tablet Commonly known as: ASCORBIC ACID Take 1,000-1,500 mg by mouth daily as needed (immune support).   Vitamin D3 25 MCG (1000 UT) Caps Take 1 capsule by mouth daily.       Allergies:  Allergies  Allergen Reactions  . Sulfa Antibiotics Other (See Comments)    Joint pain  . Ace Inhibitors Cough  . Invokana [Canagliflozin] Other (See Comments)    Leg pain  . Penicillins Rash    Did it involve swelling of the face/tongue/throat, SOB, or low BP? No Did it involve sudden or severe rash/hives, skin peeling, or any reaction on the inside of  your mouth or nose? Yes Did you need to seek medical attention at a hospital or doctor's office? Yes When did it last happen?childhood allergy If all above answers are "NO", may proceed with cephalosporin use.     Family History: Family History  Problem Relation Age of Onset  . Heart disease Mother   . Cancer Father        Lung and colon cancer  . Heart disease Father   . Emphysema Maternal Grandfather   . Tuberculosis Maternal Grandmother     Social History:  reports that he quit smoking about 41 years ago. His smoking use included cigars. He quit smokeless tobacco use about 31 years ago.  His smokeless tobacco use included chew. He reports previous alcohol use. He reports that he does not use drugs.   Physical Exam: BP (!) 141/86   Pulse 80   Ht 5\' 8"  (1.727 m)   Wt 154 lb (69.9 kg)   BMI 23.42 kg/m   Constitutional:  Alert and oriented, No acute distress. HEENT: [place] AT, moist mucus membranes.  Trachea midline, no masses. Cardiovascular: No clubbing, cyanosis, or edema. Respiratory: Normal respiratory effort, no increased work of breathing. Skin: No rashes, bruises or suspicious lesions. Neurologic: Grossly intact, no focal deficits, moving all 4 extremities. Psychiatric: Normal mood and affect.  Laboratory Data:  Lab Results  Component Value Date   CREATININE 0.86 04/06/2020    Lab Results  Component Value Date   HGBA1C 7.9 (H) 12/25/2019    Urinalysis UA shows no RBC's, 11-30 WBC's, 3+ leukocytes.   Pertinent Imaging: CLINICAL DATA:  Bladder cancer treated with chemotherapy. Left kidney cancer.  EXAM: CT CHEST, ABDOMEN, AND PELVIS WITH CONTRAST  TECHNIQUE: Multidetector CT imaging of the chest, abdomen and pelvis was performed following the standard protocol during bolus administration of intravenous contrast.  CONTRAST:  120mL OMNIPAQUE IOHEXOL 300 MG/ML  SOLN  COMPARISON:  10/24/2019.  FINDINGS: CT CHEST FINDINGS  Cardiovascular:  Right IJ Port-A-Cath terminates in the low SVC. Narrowing/occlusion of the right brachiocephalic vein with extensive collateral venous flow. Atherosclerotic calcification of the aorta and coronary arteries. Heart is enlarged. No pericardial effusion.  Mediastinum/Nodes: No pathologically enlarged mediastinal, hilar or axillary lymph nodes. There may be mild lower esophageal wall thickening which can be seen with gastroesophageal reflux.  Lungs/Pleura: Peripheral subpleural reticulation without a definite zonal predominance, as on the prior exam. No pleural fluid. Airway is unremarkable.  Musculoskeletal: Degenerative changes in the spine. No worrisome lytic or  sclerotic lesions.  CT ABDOMEN PELVIS FINDINGS  Hepatobiliary: Liver is unremarkable. A small stone is seen in the gallbladder. No biliary ductal dilatation.  Pancreas: Negative.  Spleen: Negative.  Adrenals/Urinary Tract: Adrenal glands are unremarkable. Tiny stone in the lower pole right kidney. Low-attenuation lesions in the kidneys measure up to 3.8 cm on the right and are likely cysts. There is a soft tissue lesion in the intrarenal collecting system of the lower pole left kidney measuring 1.5 x 2.1 cm (coronal image 86). On delayed imaging, there is decreased attenuation involving the mid and lower poles of the left kidney ureters as on the prior exam. Ureters are decompressed. Bladder is grossly unremarkable.  Stomach/Bowel: Small hiatal hernia. Stomach, small bowel and appendix are unremarkable. Stool is seen throughout the colon, indicative of constipation.  Vascular/Lymphatic: Atherosclerotic calcification of the aorta without aneurysm. No pathologically enlarged lymph nodes.  Reproductive: Prostate is visualized.  Other: No free fluid.  Mesenteries and peritoneum are unremarkable.  Musculoskeletal: Faint sclerotic lesion in the medial right iliac wing (2/95), unchanged. No worrisome lytic or  sclerotic lesions. Degenerative changes in the spine.  IMPRESSION: 1. Soft tissue mass in the intrarenal collecting system of the lower pole left kidney, consistent with recurrent disease. No metastatic adenopathy. 2. Right renal stone. 3. Cholelithiasis. 4. Peripheral subpleural pulmonary parenchymal reticulation can be seen with usual interstitial pneumonitis or nonspecific interstitial pneumonitis. 5. Aortic atherosclerosis (ICD10-I70.0). Coronary artery calcification.   Electronically Signed   By: Lorin Picket M.D.   On: 01/21/2020 13:11  I have personally reviewed the images and agree with radiologist interpretation.    Assessment & Plan:    1. Gross hematuria  UA shows no RBC's, 11-30 WBC's, 3+ leukocytes.  Intermittent gross hematuria.  We discussed today that this could be dietary especially after eating beets Alternatively, this could be intermittent bleeding from progressive upper tract disease which unfortunately there will be no additional treatment for outside of what he is already receiving palliatively at the cancer center Overall, he has done remarkably well and current bleeding seems to be intermittent and mild, hemoglobin is stable Advised patient to return if he experience any increased urinary symptoms or persistent gross hematuria.   2. Constipation  Continue daily stool softeners.    [place] [address], [place], [place] [address] [phone]  I, Selena Batten, am acting as a scribe for Dr. [name].  I have reviewed the above documentation for accuracy and completeness, and I agree with the above.   [name], MD  I spent 30 total minutes on the day of the encounter including pre-visit review of the medical record, face-to-face time with the patient, and post visit ordering of labs/imaging/tests.  Case was previously discussed with Dr. Janese Banks.  Imaging was personally reviewed today.   Goals of care discussion.

## 2020-04-27 ENCOUNTER — Ambulatory Visit (INDEPENDENT_AMBULATORY_CARE_PROVIDER_SITE_OTHER): Payer: Medicare Other | Admitting: Urology

## 2020-04-27 ENCOUNTER — Other Ambulatory Visit: Payer: Self-pay

## 2020-04-27 VITALS — BP 141/86 | HR 80 | Ht 68.0 in | Wt 154.0 lb

## 2020-04-27 DIAGNOSIS — I2510 Atherosclerotic heart disease of native coronary artery without angina pectoris: Secondary | ICD-10-CM | POA: Diagnosis not present

## 2020-04-27 DIAGNOSIS — R310 Gross hematuria: Secondary | ICD-10-CM

## 2020-04-27 DIAGNOSIS — C642 Malignant neoplasm of left kidney, except renal pelvis: Secondary | ICD-10-CM

## 2020-04-28 LAB — URINALYSIS, COMPLETE
Bilirubin, UA: NEGATIVE
Ketones, UA: NEGATIVE
Leukocytes,UA: NEGATIVE
Nitrite, UA: NEGATIVE
Protein,UA: NEGATIVE
RBC, UA: NEGATIVE
Specific Gravity, UA: 1.01 (ref 1.005–1.030)
Urobilinogen, Ur: 0.2 mg/dL (ref 0.2–1.0)
pH, UA: 5.5 (ref 5.0–7.5)

## 2020-04-28 LAB — MICROSCOPIC EXAMINATION: Bacteria, UA: NONE SEEN

## 2020-04-30 LAB — CULTURE, URINE COMPREHENSIVE

## 2020-05-01 ENCOUNTER — Encounter: Payer: Self-pay | Admitting: Urology

## 2020-05-04 ENCOUNTER — Encounter: Payer: Self-pay | Admitting: Oncology

## 2020-05-04 ENCOUNTER — Inpatient Hospital Stay (HOSPITAL_BASED_OUTPATIENT_CLINIC_OR_DEPARTMENT_OTHER): Payer: Medicare Other | Admitting: Oncology

## 2020-05-04 ENCOUNTER — Other Ambulatory Visit: Payer: Self-pay

## 2020-05-04 ENCOUNTER — Inpatient Hospital Stay: Payer: Medicare Other

## 2020-05-04 VITALS — BP 113/92 | HR 71 | Temp 96.4°F | Resp 16 | Wt 159.3 lb

## 2020-05-04 DIAGNOSIS — T451X5A Adverse effect of antineoplastic and immunosuppressive drugs, initial encounter: Secondary | ICD-10-CM

## 2020-05-04 DIAGNOSIS — G620 Drug-induced polyneuropathy: Secondary | ICD-10-CM

## 2020-05-04 DIAGNOSIS — D701 Agranulocytosis secondary to cancer chemotherapy: Secondary | ICD-10-CM

## 2020-05-04 DIAGNOSIS — I2510 Atherosclerotic heart disease of native coronary artery without angina pectoris: Secondary | ICD-10-CM | POA: Diagnosis not present

## 2020-05-04 DIAGNOSIS — C642 Malignant neoplasm of left kidney, except renal pelvis: Secondary | ICD-10-CM

## 2020-05-04 DIAGNOSIS — Z5111 Encounter for antineoplastic chemotherapy: Secondary | ICD-10-CM | POA: Diagnosis not present

## 2020-05-04 DIAGNOSIS — Z5112 Encounter for antineoplastic immunotherapy: Secondary | ICD-10-CM | POA: Diagnosis not present

## 2020-05-04 DIAGNOSIS — R319 Hematuria, unspecified: Secondary | ICD-10-CM | POA: Diagnosis not present

## 2020-05-04 DIAGNOSIS — C778 Secondary and unspecified malignant neoplasm of lymph nodes of multiple regions: Secondary | ICD-10-CM | POA: Diagnosis not present

## 2020-05-04 LAB — CBC WITH DIFFERENTIAL/PLATELET
Abs Immature Granulocytes: 0.02 10*3/uL (ref 0.00–0.07)
Basophils Absolute: 0.1 10*3/uL (ref 0.0–0.1)
Basophils Relative: 1 %
Eosinophils Absolute: 0.3 10*3/uL (ref 0.0–0.5)
Eosinophils Relative: 4 %
HCT: 36.2 % — ABNORMAL LOW (ref 39.0–52.0)
Hemoglobin: 12.4 g/dL — ABNORMAL LOW (ref 13.0–17.0)
Immature Granulocytes: 0 %
Lymphocytes Relative: 16 %
Lymphs Abs: 1.2 10*3/uL (ref 0.7–4.0)
MCH: 30.1 pg (ref 26.0–34.0)
MCHC: 34.3 g/dL (ref 30.0–36.0)
MCV: 87.9 fL (ref 80.0–100.0)
Monocytes Absolute: 0.7 10*3/uL (ref 0.1–1.0)
Monocytes Relative: 10 %
Neutro Abs: 4.9 10*3/uL (ref 1.7–7.7)
Neutrophils Relative %: 69 %
Platelets: 190 10*3/uL (ref 150–400)
RBC: 4.12 MIL/uL — ABNORMAL LOW (ref 4.22–5.81)
RDW: 14.8 % (ref 11.5–15.5)
WBC: 7 10*3/uL (ref 4.0–10.5)
nRBC: 0 % (ref 0.0–0.2)

## 2020-05-04 LAB — COMPREHENSIVE METABOLIC PANEL
ALT: 13 U/L (ref 0–44)
AST: 20 U/L (ref 15–41)
Albumin: 4 g/dL (ref 3.5–5.0)
Alkaline Phosphatase: 83 U/L (ref 38–126)
Anion gap: 8 (ref 5–15)
BUN: 17 mg/dL (ref 8–23)
CO2: 26 mmol/L (ref 22–32)
Calcium: 9.4 mg/dL (ref 8.9–10.3)
Chloride: 101 mmol/L (ref 98–111)
Creatinine, Ser: 0.84 mg/dL (ref 0.61–1.24)
GFR, Estimated: >60 mL/min (ref >60)
Glucose, Bld: 209 mg/dL — ABNORMAL HIGH (ref 70–99)
Potassium: 4.2 mmol/L (ref 3.5–5.1)
Sodium: 135 mmol/L (ref 135–145)
Total Bilirubin: 1.1 mg/dL (ref 0.3–1.2)
Total Protein: 7.1 g/dL (ref 6.5–8.1)

## 2020-05-04 MED ORDER — SODIUM CHLORIDE 0.9% FLUSH
10.0000 mL | INTRAVENOUS | Status: DC | PRN
  Administered 2020-05-04: 10 mL via INTRAVENOUS
  Filled 2020-05-04: qty 10

## 2020-05-04 MED ORDER — PALONOSETRON HCL INJECTION 0.25 MG/5ML
0.2500 mg | Freq: Once | INTRAVENOUS | Status: AC
  Administered 2020-05-04: 0.25 mg via INTRAVENOUS
  Filled 2020-05-04: qty 5

## 2020-05-04 MED ORDER — SODIUM CHLORIDE 0.9 % IV SOLN
Freq: Once | INTRAVENOUS | Status: AC
  Filled 2020-05-04: qty 250

## 2020-05-04 MED ORDER — SODIUM CHLORIDE 0.9 % IV SOLN
1.0000 mg/kg | Freq: Once | INTRAVENOUS | Status: AC
  Administered 2020-05-04: 70 mg via INTRAVENOUS
  Filled 2020-05-04: qty 3

## 2020-05-04 MED ORDER — HEPARIN SOD (PORK) LOCK FLUSH 100 UNIT/ML IV SOLN
500.0000 [IU] | Freq: Once | INTRAVENOUS | Status: AC
  Administered 2020-05-04: 500 [IU] via INTRAVENOUS
  Filled 2020-05-04: qty 5

## 2020-05-04 MED ORDER — HEPARIN SOD (PORK) LOCK FLUSH 100 UNIT/ML IV SOLN
INTRAVENOUS | Status: AC
  Filled 2020-05-04: qty 5

## 2020-05-07 NOTE — Progress Notes (Signed)
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 [name] so far for his metastatic urothelial carcinoma. This was originally diagnosed in May 2019. He was noted to have a filling defect in the lower pole collecting system of the left kidney along with para-aortic and retroperitoneal adenopathy concerning for metastatic disease. He underwent left ureteroscopy and renal pelvis biopsy which revealed small fragments of high-grade urothelial carcinoma with small focus of invasion. He was started on carboplatin and gemcitabine chemotherapy in June 2019 and was continued on 05/27/2018 for 8 cycles. He tolerated chemotherapy well except for chemo-induced anemia for which she has been getting Procrit every 2 weeks. He did have response to his disease based on scans in August 2019. However repeat scan on 05/31/2018 showed increase in the size of the primary tumor from 1.2 to 1.9 cm and increase in left para-aortic adenopathy from 0.9 to 1.3 cm. Periportal adenopathy was stable at 1.4 cm. Second line immunotherapy was recommended. His initial biopsy specimen did not have enough sample to undergo FGFR mutation testing. He also has mild interstitial lung disease for which he sees pulmonary but he is not on home oxygen. He has B12 deficiency for  which he is on oral B12. Also has diabetes and coronary artery disease.Tecentriq started on 06/17/2018.Patient noted to have disease progression in his lymph nodes in May 2020. Repeat biopsy showed metastatic urothelial carcinoma which did not have aFGFR mutation. He has been started on third line Padcev  Treatment on hold since April 2021 due to neuropathy and to maintain quality of lifebut restarted in August 2021after he was found to have progression of disease  Interval history-patient reports feeling well overall.  Appetite and weight have remained stable.  Neuropathy has not worsened.  He does have intermittent hematuria for which she was seen by Dr. Erlene Quan as well.  Given that it was not hemodynamically significant and possibly secondary to his malignancy no intervention was deemed necessary at this time.  ECOG PS- 1 Pain scale- 0 Opioid associated constipation- no  Review of systems- Review of Systems  Constitutional: Positive for malaise/fatigue. Negative for chills, fever and weight loss.  HENT: Negative for congestion, ear discharge and nosebleeds.   Eyes: Negative for blurred vision.  Respiratory: Negative for cough, hemoptysis, sputum production, shortness of breath and wheezing.   Cardiovascular: Negative for chest pain, palpitations, orthopnea and claudication.  Gastrointestinal: Negative for abdominal pain, blood in stool, constipation, diarrhea, heartburn, melena, nausea and vomiting.  Genitourinary: Negative for dysuria, flank pain, frequency, hematuria and urgency.  Musculoskeletal: Negative for back pain, joint pain and myalgias.  Skin: Negative for rash.  Neurological: Positive for sensory change (Peripheral neuropathy). Negative for dizziness, tingling, focal weakness, seizures, weakness and headaches.  Endo/Heme/Allergies: Does not bruise/bleed easily.  Psychiatric/Behavioral: Negative for depression and suicidal ideas. The patient does not have insomnia.          Allergies  Allergen Reactions  .  Sulfa Antibiotics Other (See Comments)    Joint pain  . Ace Inhibitors Cough  . Invokana [Canagliflozin] Other (See Comments)    Leg pain  . Penicillins Rash    Did it involve swelling of the face/tongue/throat, SOB, or low BP? No Did it involve sudden or severe rash/hives, skin peeling, or any reaction on the inside of your mouth or nose? Yes Did you need to seek medical attention at a hospital or doctor's office? Yes When did it last happen?childhood allergy If all above answers are "NO", may proceed with cephalosporin use.      Past Medical History:  Diagnosis Date  . Acid reflux   . Anxiety   . Depression   . Diabetes mellitus without complication ([place])   . Hyperlipidemia   . Hypertension   . MRSA (methicillin resistant Staphylococcus aureus) infection 1992   history  . Myocardial infarction ([place]) 1995  . Sleep apnea    CPAP  . Urothelial cancer ([place]) 11/2017   Left Urothelial mass, chemo tx's     Past Surgical History:  Procedure Laterality Date  . CATARACT EXTRACTION Left   . COLONOSCOPY  2010   [facility]  . COLONOSCOPY WITH PROPOFOL N/A 10/04/2016   Procedure: COLONOSCOPY WITH PROPOFOL;  Surgeon: [name], MD;  Location: [facility] ENDOSCOPY;  Service: Endoscopy;  Laterality: N/A;  . [place], 2000, 2001, 2014  . CYSTOSCOPY W/ RETROGRADES Bilateral 12/10/2017   Procedure: CYSTOSCOPY WITH RETROGRADE PYELOGRAM;  Surgeon: [name], MD;  Location: [facility] ORS;  Service: Urology;  Laterality: Bilateral;  . CYSTOSCOPY W/ RETROGRADES Left 12/09/2018   Procedure: CYSTOSCOPY WITH RETROGRADE PYELOGRAM;  Surgeon: [name], MD;  Location: [facility] ORS;  Service: Urology;  Laterality: Left;  . CYSTOSCOPY WITH BIOPSY Left 12/09/2018   Procedure: CYSTOSCOPY WITH URETERAL/RENAL PELVIC BIOPSY;  Surgeon: [name], MD;  Location: [facility] ORS;  Service: Urology;  Laterality: Left;  .  CYSTOSCOPY WITH STENT PLACEMENT Left 12/10/2017   Procedure: CYSTOSCOPY WITH STENT PLACEMENT;  Surgeon: [name], MD;  Location: [facility] ORS;  Service: Urology;  Laterality: Left;  . CYSTOSCOPY WITH STENT PLACEMENT Left 12/09/2018   Procedure: CYSTOSCOPY WITH STENT PLACEMENT;  Surgeon: [name], MD;  Location: [facility] ORS;  Service: Urology;  Laterality: Left;  . CYSTOSCOPY WITH URETEROSCOPY Left 12/09/2018   Procedure: CYSTOSCOPY WITH URETEROSCOPY;  Surgeon: [name], MD;  Location: [facility] ORS;  Service: Urology;  Laterality: Left;  . EYE SURGERY Bilateral    cataract  . INGUINAL HERNIA REPAIR Right 08/09/2015   Procedure: HERNIA REPAIR INGUINAL ADULT;  Surgeon: [name], MD;  Location: [facility] ORS;  Service: General;  Laterality: Right;  . NASAL SINUS SURGERY    . nuclear stress test    . PORTA CATH INSERTION N/A 12/19/2017   Procedure: PORTA CATH INSERTION;  Surgeon: [name], MD;  Location: [place] CV LAB;  Service: Cardiovascular;  Laterality: N/A;  . URETERAL BIOPSY Left 12/10/2017   Procedure: URETERAL & renal PELVIS BIOPSY;  Surgeon: [name], MD;  Location: [facility] ORS;  Service: Urology;  Laterality: Left;  . URETEROSCOPY Left 12/10/2017   Procedure: URETEROSCOPY;  Surgeon: [name], MD;  Location: [facility] ORS;  Service: Urology;  Laterality: Left;    Social History   Socioeconomic History  . Marital status: Married    Spouse name: [name]  . Number of children: 1  . Years of education: Not on file  . Highest education level: Associate degree: occupational,  technical, or vocational program  Occupational History  . Occupation: retired  Tobacco Use  . Smoking status: Former Smoker    Types: Cigars    Quit date: 10/09/1978    Years since quitting: 41.6  . Smokeless tobacco: Former Systems developer    Types: Chew    Quit date: 12/04/1988  . Tobacco comment: on occasion  Vaping Use  . Vaping Use: Never used  Substance and Sexual Activity  . Alcohol use: Not  Currently  . Drug use: No  . Sexual activity: Not Currently  Other Topics Concern  . Not on file  Social History Narrative  . Not on file   Social Determinants of Health   Financial Resource Strain: Low Risk   . Difficulty of Paying Living Expenses: Not hard at all  Food Insecurity: No Food Insecurity  . Worried About Charity fundraiser in the Last Year: Never true  . Ran Out of Food in the Last Year: Never true  Transportation Needs: No Transportation Needs  . Lack of Transportation (Medical): No  . Lack of Transportation (Non-Medical): No  Physical Activity: Insufficiently Active  . Days of Exercise per Week: 3 days  . Minutes of Exercise per Session: 30 min  Stress: No Stress Concern Present  . Feeling of Stress : Not at all  Social Connections: Moderately Integrated  . Frequency of Communication with Friends and Family: Three times a week  . Frequency of Social Gatherings with Friends and Family: More than three times a week  . Attends Religious Services: More than 4 times per year  . Active Member of Clubs or Organizations: No  . Attends Archivist Meetings: Never  . Marital Status: Married  Human resources officer Violence: Not At Risk  . Fear of Current or Ex-Partner: No  . Emotionally Abused: No  . Physically Abused: No  . Sexually Abused: No    Family History  Problem Relation Age of Onset  . Heart disease Mother   . Cancer Father        Lung and colon cancer  . Heart disease Father   . Emphysema Maternal Grandfather   . Tuberculosis Maternal Grandmother      Current Outpatient Medications:  .  aspirin EC 81 MG tablet, Take 81 mg by mouth every evening. , Disp: , Rfl:  .  atorvastatin (LIPITOR) 40 MG tablet, TAKE 1 TABLET BY MOUTH AT BEDTIME, Disp: 90 tablet, Rfl: 4 .  Cholecalciferol (VITAMIN D3) 25 MCG (1000 UT) CAPS, Take 1 capsule by mouth daily., Disp: , Rfl:  .  clobetasol cream (TEMOVATE) 2.02 %, Apply 1 application topically as needed (for skin  rash)., Disp: 30 g, Rfl: 1 .  empagliflozin (JARDIANCE) 10 MG TABS tablet, Take 1 tablet (10 mg total) by mouth daily., Disp: 90 tablet, Rfl: 2 .  fluticasone (FLONASE) 50 MCG/ACT nasal spray, Use 2 spray(s) in each nostril once daily, Disp: 48 g, Rfl: 3 .  glucose blood (CONTOUR NEXT TEST) test strip, 1 each by Other route daily. USE 1 STRIP TO CHECK GLUCOSE ONCE DAILY, Disp: 100 each, Rfl: 3 .  hydrOXYzine (ATARAX/VISTARIL) 25 MG tablet, Take 1 tablet (25 mg total) by mouth every 8 (eight) hours as needed for itching., Disp: 30 tablet, Rfl: 3 .  Ivermectin (SOOLANTRA) 1 % CREA, Apply 1 application topically daily as needed (rosacea). To face, Disp: , Rfl:  .  lidocaine-prilocaine (EMLA) cream, Apply 1 application topically as needed (port access)., Disp: 1 g, Rfl: 3 .  losartan (COZAAR) 50 MG tablet, Take 1 tablet (50 mg total) by mouth daily., Disp: 90 tablet, Rfl: 3 .  magic mouthwash w/lidocaine SOLN, Take 5 mLs by mouth 4 (four) times daily as needed for mouth pain., Disp: 480 mL, Rfl: 3 .  magnesium hydroxide (MILK OF MAGNESIA) 400 MG/5ML suspension, Take 15 mLs by mouth daily as needed for mild constipation., Disp: , Rfl:  .  metFORMIN (GLUCOPHAGE) 1000 MG tablet, TAKE 1 TABLET BY MOUTH TWICE DAILY WITH MEALS, Disp: 180 tablet, Rfl: 0 .  nystatin cream (MYCOSTATIN), Apply topically 2 (two) times daily., Disp: 15 g, Rfl: 1 .  OVER THE COUNTER MEDICATION, Apply 1 application topically daily as needed (pain). [place]back topical pain relief , Disp: , Rfl:  .  pregabalin (LYRICA) 75 MG capsule, Take 1 capsule (75 mg total) by mouth daily., Disp: 30 capsule, Rfl: 1 .  sertraline (ZOLOFT) 100 MG tablet, Take 1 tablet by mouth once daily, Disp: 90 tablet, Rfl: 1 .  vitamin B-12 (CYANOCOBALAMIN) 1000 MCG tablet, Take by mouth daily. Unsure dose, Disp: , Rfl:  .  vitamin C (ASCORBIC ACID) 500 MG tablet, Take 1,000-1,500 mg by mouth daily as needed (immune support). , Disp: , Rfl:  No current  facility-administered medications for this visit.  Facility-Administered Medications Ordered in Other Visits:  .  Influenza vac split quadrivalent PF (FLUZONE HIGH-DOSE) injection 0.5 mL, 0.5 mL, Intramuscular, Once, Karen Kitchens, NP  Physical exam:  Vitals:   05/04/20 1317  BP: (!) 113/92  Pulse: 71  Resp: 16  Temp: (!) 96.4 F (35.8 C)  TempSrc: Tympanic  SpO2: 100%  Weight: 159 lb 4.8 oz (72.3 kg)   Physical Exam Constitutional:      General: He is not in acute distress. Cardiovascular:     Rate and Rhythm: Normal rate and regular rhythm.     Heart sounds: Normal heart sounds.  Pulmonary:     Effort: Pulmonary effort is normal.     Breath sounds: Normal breath sounds.  Abdominal:     General: Bowel sounds are normal.     Palpations: Abdomen is soft.  Skin:    General: Skin is warm and dry.  Neurological:     Mental Status: He is alert and oriented to person, place, and time.      CMP Latest Ref Rng & Units 05/04/2020  Glucose 70 - 99 mg/dL 209(H)  BUN 8 - 23 mg/dL 17  Creatinine 0.61 - 1.24 mg/dL 0.84  Sodium 135 - 145 mmol/L 135  Potassium 3.5 - 5.1 mmol/L 4.2  Chloride 98 - 111 mmol/L 101  CO2 22 - 32 mmol/L 26  Calcium 8.9 - 10.3 mg/dL 9.4  Total Protein 6.5 - 8.1 g/dL 7.1  Total Bilirubin 0.3 - 1.2 mg/dL 1.1  Alkaline Phos 38 - 126 U/L 83  AST 15 - 41 U/L 20  ALT 0 - 44 U/L 13   CBC Latest Ref Rng & Units 05/04/2020  WBC 4.0 - 10.5 K/uL 7.0  Hemoglobin 13.0 - 17.0 g/dL 12.4(L)  Hematocrit 39 - 52 % 36.2(L)  Platelets 150 - 400 K/uL 190      Assessment and plan- Patient is a 74 y.o. 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  He is here for on treatment assessment prior to next cycle of Padcev  Counts okay to proceed with next cycle of Padcev today.We are doing it 1 week on 3 weeks of to reduce  the chances of worsening neuropathy and it has kept his disease under control in the past.  We had held  his treatment for nearly 2 months and was restarted 3 weeks ago.  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  Continue to monitor  Visit Diagnosis 1. Encounter for antineoplastic chemotherapy   2. Chemotherapy-induced peripheral neuropathy ([place])   3. Urothelial carcinoma of kidney, left ([place])   4. Hematuria, unspecified type      Dr. Randa Evens, MD, MPH [facility] [phone] 05/07/2020 12:56 PM

## 2020-05-11 ENCOUNTER — Telehealth: Payer: Self-pay

## 2020-05-11 ENCOUNTER — Ambulatory Visit: Payer: Medicare Other | Admitting: Urology

## 2020-05-11 NOTE — Telephone Encounter (Signed)
Patient called today stating that he has seen some small clots in his urine and passed a clot about the size of a quarter and is worried and would like to be seen. He was reassured that seeing some blood is normal with his history along with passing clots.  If he starts having trouble voiding or if he starts filling the toilet bowel up with thick clotted blood that is ketchup or wine colored he should call back. Patient denies fever chills nausea or vomiting and denies other urinary symptoms. He was encouraged to drink plenty of fluids and call back if symptoms worsen.

## 2020-05-18 ENCOUNTER — Other Ambulatory Visit: Payer: Self-pay | Admitting: Oncology

## 2020-05-19 ENCOUNTER — Other Ambulatory Visit: Payer: Self-pay | Admitting: Oncology

## 2020-05-27 ENCOUNTER — Other Ambulatory Visit: Payer: Self-pay | Admitting: Family Medicine

## 2020-05-27 ENCOUNTER — Other Ambulatory Visit: Payer: Medicare Other | Admitting: Adult Health Nurse Practitioner

## 2020-05-27 ENCOUNTER — Telehealth: Payer: Self-pay | Admitting: *Deleted

## 2020-05-27 NOTE — Telephone Encounter (Signed)
Call returned and VERBAL ORDER given to proceed with extractions now

## 2020-05-27 NOTE — Telephone Encounter (Signed)
Dr Zorita Pang office called asking if patient can have 2 decayed teeth extracted due to causing him pain.or if he needs to wait until done with treatment. Please advise

## 2020-05-27 NOTE — Telephone Encounter (Signed)
He can get it done now

## 2020-05-28 ENCOUNTER — Inpatient Hospital Stay: Payer: Medicare Other | Admitting: Hospice and Palliative Medicine

## 2020-05-28 ENCOUNTER — Other Ambulatory Visit: Payer: Self-pay

## 2020-05-28 HISTORY — PX: TOOTH EXTRACTION: SUR596

## 2020-06-01 ENCOUNTER — Inpatient Hospital Stay: Payer: Medicare Other | Attending: Hospice and Palliative Medicine

## 2020-06-01 ENCOUNTER — Inpatient Hospital Stay (HOSPITAL_BASED_OUTPATIENT_CLINIC_OR_DEPARTMENT_OTHER): Payer: Medicare Other | Admitting: Oncology

## 2020-06-01 ENCOUNTER — Encounter: Payer: Self-pay | Admitting: Oncology

## 2020-06-01 ENCOUNTER — Inpatient Hospital Stay (HOSPITAL_BASED_OUTPATIENT_CLINIC_OR_DEPARTMENT_OTHER): Payer: Medicare Other | Admitting: Hospice and Palliative Medicine

## 2020-06-01 ENCOUNTER — Inpatient Hospital Stay: Payer: Medicare Other

## 2020-06-01 VITALS — BP 130/90 | HR 88 | Temp 96.2°F | Wt 160.1 lb

## 2020-06-01 DIAGNOSIS — G620 Drug-induced polyneuropathy: Secondary | ICD-10-CM | POA: Diagnosis not present

## 2020-06-01 DIAGNOSIS — I2510 Atherosclerotic heart disease of native coronary artery without angina pectoris: Secondary | ICD-10-CM | POA: Diagnosis not present

## 2020-06-01 DIAGNOSIS — C778 Secondary and unspecified malignant neoplasm of lymph nodes of multiple regions: Secondary | ICD-10-CM | POA: Diagnosis not present

## 2020-06-01 DIAGNOSIS — C642 Malignant neoplasm of left kidney, except renal pelvis: Secondary | ICD-10-CM

## 2020-06-01 DIAGNOSIS — Z5111 Encounter for antineoplastic chemotherapy: Secondary | ICD-10-CM

## 2020-06-01 DIAGNOSIS — Z515 Encounter for palliative care: Secondary | ICD-10-CM

## 2020-06-01 DIAGNOSIS — T451X5A Adverse effect of antineoplastic and immunosuppressive drugs, initial encounter: Secondary | ICD-10-CM | POA: Diagnosis not present

## 2020-06-01 DIAGNOSIS — Z5112 Encounter for antineoplastic immunotherapy: Secondary | ICD-10-CM | POA: Diagnosis not present

## 2020-06-01 DIAGNOSIS — D701 Agranulocytosis secondary to cancer chemotherapy: Secondary | ICD-10-CM

## 2020-06-01 LAB — COMPREHENSIVE METABOLIC PANEL
ALT: 12 U/L (ref 0–44)
AST: 18 U/L (ref 15–41)
Albumin: 4 g/dL (ref 3.5–5.0)
Alkaline Phosphatase: 84 U/L (ref 38–126)
Anion gap: 12 (ref 5–15)
BUN: 18 mg/dL (ref 8–23)
CO2: 23 mmol/L (ref 22–32)
Calcium: 9.7 mg/dL (ref 8.9–10.3)
Chloride: 101 mmol/L (ref 98–111)
Creatinine, Ser: 0.63 mg/dL (ref 0.61–1.24)
GFR, Estimated: >60 mL/min (ref >60)
Glucose, Bld: 159 mg/dL — ABNORMAL HIGH (ref 70–99)
Potassium: 4.2 mmol/L (ref 3.5–5.1)
Sodium: 136 mmol/L (ref 135–145)
Total Bilirubin: 0.8 mg/dL (ref 0.3–1.2)
Total Protein: 7.5 g/dL (ref 6.5–8.1)

## 2020-06-01 LAB — CBC WITH DIFFERENTIAL/PLATELET
Abs Immature Granulocytes: 0.01 10*3/uL (ref 0.00–0.07)
Basophils Absolute: 0.1 10*3/uL (ref 0.0–0.1)
Basophils Relative: 1 %
Eosinophils Absolute: 0.3 10*3/uL (ref 0.0–0.5)
Eosinophils Relative: 5 %
HCT: 37.8 % — ABNORMAL LOW (ref 39.0–52.0)
Hemoglobin: 12.7 g/dL — ABNORMAL LOW (ref 13.0–17.0)
Immature Granulocytes: 0 %
Lymphocytes Relative: 19 %
Lymphs Abs: 1.3 10*3/uL (ref 0.7–4.0)
MCH: 29.6 pg (ref 26.0–34.0)
MCHC: 33.6 g/dL (ref 30.0–36.0)
MCV: 88.1 fL (ref 80.0–100.0)
Monocytes Absolute: 0.6 10*3/uL (ref 0.1–1.0)
Monocytes Relative: 9 %
Neutro Abs: 4.5 10*3/uL (ref 1.7–7.7)
Neutrophils Relative %: 66 %
Platelets: 209 10*3/uL (ref 150–400)
RBC: 4.29 MIL/uL (ref 4.22–5.81)
RDW: 14.6 % (ref 11.5–15.5)
WBC: 6.7 10*3/uL (ref 4.0–10.5)
nRBC: 0 % (ref 0.0–0.2)

## 2020-06-01 MED ORDER — SODIUM CHLORIDE 0.9 % IV SOLN
1.0000 mg/kg | Freq: Once | INTRAVENOUS | Status: AC
  Administered 2020-06-01: 70 mg via INTRAVENOUS
  Filled 2020-06-01: qty 3

## 2020-06-01 MED ORDER — PALONOSETRON HCL INJECTION 0.25 MG/5ML
0.2500 mg | Freq: Once | INTRAVENOUS | Status: AC
  Administered 2020-06-01: 0.25 mg via INTRAVENOUS
  Filled 2020-06-01: qty 5

## 2020-06-01 MED ORDER — HEPARIN SOD (PORK) LOCK FLUSH 100 UNIT/ML IV SOLN
INTRAVENOUS | Status: AC
  Filled 2020-06-01: qty 5

## 2020-06-01 MED ORDER — SODIUM CHLORIDE 0.9 % IV SOLN
Freq: Once | INTRAVENOUS | Status: AC
  Filled 2020-06-01: qty 250

## 2020-06-01 MED ORDER — SODIUM CHLORIDE 0.9% FLUSH
10.0000 mL | Freq: Once | INTRAVENOUS | Status: AC
  Administered 2020-06-01: 10 mL via INTRAVENOUS
  Filled 2020-06-01: qty 10

## 2020-06-01 MED ORDER — HEPARIN SOD (PORK) LOCK FLUSH 100 UNIT/ML IV SOLN
500.0000 [IU] | Freq: Once | INTRAVENOUS | Status: AC | PRN
  Administered 2020-06-01: 500 [IU]
  Filled 2020-06-01: qty 5

## 2020-06-01 NOTE — Progress Notes (Signed)
[place]  Telephone:([phone] [phone]   Name: Patrick Mcgee Date: 06/01/2020 MRN: [id number]  DOB: 27-Dec-1945  Patient Care Team: Jerrol Banana., MD as PCP - General (Family Medicine) Dingeldein, [name], MD as Consulting Physician (Ophthalmology) [name] as Consulting Physician (Internal Medicine) Sindy Guadeloupe, MD as Consulting Physician (Oncology)    REASON FOR CONSULTATION: 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 PMH is also notable for mild interstitial lung disease, diabetes, and CAD status post previous stenting. CT of the chest, abdomen, and pelvis on 714/21 revealed soft tissue mass and intrarenal collecting system left kidney consistent with recurrent disease but no metastatic adenopathy was found. Patient was referred to palliative care to help address goals and manage ongoing symptoms.  SOCIAL HISTORY:     reports that he quit smoking about 41 years ago. His smoking use included cigars. He quit smokeless tobacco use about 31 years ago.  His smokeless tobacco use included chew. He reports previous alcohol use. He reports that he does not use drugs.   Patient is married and lives at home with his wife.  He has a daughter who lives nearby.  Patient formally worked at [employer] in [place]:  On file dated 12/28/2017  CODE STATUS: DNR (on file)  PAST MEDICAL HISTORY: Past Medical History:  Diagnosis Date   Acid reflux    Anxiety    Depression    Diabetes mellitus without complication ([place])    Hyperlipidemia    Hypertension    MRSA (methicillin resistant Staphylococcus aureus) infection 1992   history   Myocardial infarction ([place]) 1995   Sleep apnea    CPAP   Urothelial cancer ([place])  11/2017   Left Urothelial mass, chemo tx's    PAST SURGICAL HISTORY:  Past Surgical History:  Procedure Laterality Date   CATARACT EXTRACTION Left    COLONOSCOPY  2010   [facility]   COLONOSCOPY WITH PROPOFOL N/A 10/04/2016   Procedure: COLONOSCOPY WITH PROPOFOL;  Surgeon: [name], MD;  Location: [facility];  Service: Endoscopy;  Laterality: N/A;   [place], 2000, 2001, 2014   CYSTOSCOPY W/ RETROGRADES Bilateral 12/10/2017   Procedure: CYSTOSCOPY WITH RETROGRADE PYELOGRAM;  Surgeon: [name], MD;  Location: [facility];  Service: Urology;  Laterality: Bilateral;   CYSTOSCOPY W/ RETROGRADES Left 12/09/2018   Procedure: CYSTOSCOPY WITH RETROGRADE PYELOGRAM;  Surgeon: [name], MD;  Location: [facility];  Service: Urology;  Laterality: Left;   CYSTOSCOPY WITH BIOPSY Left 12/09/2018   Procedure: CYSTOSCOPY WITH URETERAL/RENAL PELVIC BIOPSY;  Surgeon: [name], MD;  Location: [facility];  Service: Urology;  Laterality: Left;   CYSTOSCOPY WITH STENT PLACEMENT Left 12/10/2017   Procedure: CYSTOSCOPY WITH STENT PLACEMENT;  Surgeon: [name], MD;  Location: [facility];  Service: Urology;  Laterality: Left;   CYSTOSCOPY WITH STENT PLACEMENT Left 12/09/2018   Procedure: CYSTOSCOPY WITH STENT PLACEMENT;  Surgeon: [name], MD;  Location: [facility];  Service: Urology;  Laterality: Left;   CYSTOSCOPY WITH URETEROSCOPY Left 12/09/2018   Procedure: CYSTOSCOPY WITH URETEROSCOPY;  Surgeon: [name], MD;  Location: [facility];  Service: Urology;  Laterality: Left;   EYE SURGERY Bilateral    cataract   INGUINAL HERNIA REPAIR Right 08/09/2015   Procedure: HERNIA REPAIR INGUINAL ADULT;  Surgeon:  [name], MD;  Location: [facility];  Service: General;  Laterality: Right;   NASAL SINUS SURGERY     nuclear stress test     PORTA CATH INSERTION N/A 12/19/2017   Procedure: PORTA CATH INSERTION;  Surgeon: [name], MD;   Location: [facility];  Service: Cardiovascular;  Laterality: N/A;   TOOTH EXTRACTION  05/28/2020   upper left and it was molars   URETERAL BIOPSY Left 12/10/2017   Procedure: URETERAL & renal PELVIS BIOPSY;  Surgeon: [name], MD;  Location: [facility];  Service: Urology;  Laterality: Left;   URETEROSCOPY Left 12/10/2017   Procedure: URETEROSCOPY;  Surgeon: [name], MD;  Location: [facility];  Service: Urology;  Laterality: Left;    HEMATOLOGY/ONCOLOGY HISTORY:  Oncology History Overview Note  Patrick Mcgee is a 75 y.o. 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  He presented with abdominal pain and weight loss.  Abdomen and pelvic CT on 12/01/2017 revealed a suspicious enhancing, filling defect involving the lower pole collecting system of the left kidney with asymmetric hypoenhancement of the inferior pole cortex of left kidney noted. Findings were worrisome for urothelial neoplasm.  There was abdominal and pelvic adenopathy compatible with nodal metastasis.  There was a 2.6 cm right para celiac node, 1,6 cm left retroperitoneal node, 1.4 cm right retroperitoneal node, 2.1 cm right common iliac node and 1 cm left external iliac node.  There was aortic atherosclerosis, kidney cysts, and prostate gland enlargement.  Chest CT on 12/07/2017 revealed no evidence of metastatic disease.    He underwent left ureteroscopy with renal pelvis biopsy and left ureteral stent placement on 12/10/2017.  Findings included a large nodular, vasculartumor within the left lower pole collecting system completely obstructing the entire left lower pole moiety. Ureter, right kidney, and bladder was normal.  Pathology revealed small fragments of high-grade urothelial carcinoma with a small focus of invasion.  He has diabetes and coronary artery disease s/p MI in 1995.  He is s/p stent plaecment x 5-6. Colonoscopy in 2018 was negative.       Urothelial carcinoma of kidney, left ([place])  12/07/2017 Initial Diagnosis   Urothelial carcinoma of kidney, left ([place])   12/21/2017 - 05/27/2018 Chemotherapy   The patient had dexamethasone (DECADRON) 4 MG tablet, 8 mg, Oral, Daily, 1 of 1 cycle, Start date: --, End date: -- palonosetron (ALOXI) injection 0.25 mg, 0.25 mg, Intravenous,  Once, 8 of 10 cycles Administration: 0.25 mg (12/21/2017), 0.25 mg (01/14/2018), 0.25 mg (02/04/2018), 0.25 mg (02/25/2018), 0.25 mg (03/18/2018), 0.25 mg (04/08/2018), 0.25 mg (04/29/2018), 0.25 mg (05/27/2018) CARBOplatin (PARAPLATIN) 390 mg in sodium chloride 0.9 % 250 mL chemo infusion, 370 mg (100 % of original dose 370.4 mg), Intravenous,  Once, 8 of 10 cycles Dose modification:   (original dose 370.4 mg, Cycle 1, Reason: Provider Judgment),   (original dose 370.4 mg, Cycle 2, Reason: Other (see comments), Comment: Continue same dose as last cycle) Administration: 390 mg (12/21/2017), 390 mg (01/14/2018), 370 mg (02/04/2018), 370 mg (02/25/2018), 370 mg (03/18/2018), 370 mg (04/08/2018), 370 mg (04/29/2018), 370 mg (05/27/2018) gemcitabine (GEMZAR) 1,600 mg in sodium chloride 0.9 % 250 mL chemo infusion, 1,482 mg (80 % of original dose 1,000 mg/m2), Intravenous,  Once, 8 of 10 cycles Dose modification: 800 mg/m2 (original dose 1,000 mg/m2, Cycle 1, Reason: Provider Judgment), 800 mg/m2 (original dose 1,000 mg/m2, Cycle 2, Reason: Other (see comments), Comment: same dose as last cycle),  850 mg/m2 (original dose 1,000 mg/m2, Cycle 3, Reason: Provider Judgment, Comment: prior dose tolerated), 700 mg/m2 (original dose 1,000 mg/m2, Cycle 7, Reason: Provider Judgment), 750 mg/m2 (original dose 1,000 mg/m2, Cycle 7, Reason: Provider Judgment) Administration: 1,600 mg (12/21/2017), 1,600 mg (12/31/2017), 1,600 mg (01/14/2018), 1,600 mg (01/21/2018), 1,600 mg (02/04/2018), 1,600 mg (02/11/2018), 1,600 mg (02/25/2018), 1,600 mg (03/18/2018), 1,600 mg (04/08/2018), 1,600 mg (04/15/2018), 1,600 mg  (04/29/2018), 1,400 mg (05/06/2018), 1,400 mg (05/27/2018) ondansetron (ZOFRAN) 8 mg in sodium chloride 0.9 % 50 mL IVPB, , Intravenous,  Once, 5 of 7 cycles Administration:  (01/21/2018)  for chemotherapy treatment.    01/14/2019 -  Chemotherapy   The patient had palonosetron (ALOXI) injection 0.25 mg, 0.25 mg, Intravenous,  Once, 11 of 12 cycles Administration: 0.25 mg (01/14/2019), 0.25 mg (02/11/2019), 0.25 mg (01/21/2019), 0.25 mg (01/28/2019), 0.25 mg (02/25/2019), 0.25 mg (03/11/2019), 0.25 mg (03/18/2019), 0.25 mg (04/22/2019), 0.25 mg (03/25/2019), 0.25 mg (04/29/2019), 0.25 mg (05/20/2019), 0.25 mg (05/28/2019), 0.25 mg (06/17/2019), 0.25 mg (07/18/2019), 0.25 mg (07/28/2019), 0.25 mg (08/12/2019), 0.25 mg (09/09/2019), 0.25 mg (10/07/2019), 0.25 mg (02/10/2020), 0.25 mg (03/09/2020), 0.25 mg (04/06/2020), 0.25 mg (05/04/2020) enfortumab vedotin-ejfv (PADCEV) 70 mg in sodium chloride 0.9 % 50 mL (1.2281 mg/mL) chemo infusion, 1 mg/kg = 70 mg (100 % of original dose 1 mg/kg), Intravenous,  Once, 11 of 12 cycles Dose modification: 1 mg/kg (original dose 1 mg/kg, Cycle 1, Reason: Provider Judgment) Administration: 70 mg (01/14/2019), 70 mg (02/11/2019), 70 mg (01/21/2019), 70 mg (01/28/2019), 70 mg (02/25/2019), 70 mg (03/11/2019), 70 mg (03/18/2019), 70 mg (04/22/2019), 70 mg (03/25/2019), 70 mg (04/29/2019), 70 mg (05/20/2019), 70 mg (05/28/2019), 70 mg (06/17/2019), 70 mg (07/18/2019), 70 mg (07/28/2019), 70 mg (08/12/2019), 70 mg (09/09/2019), 70 mg (10/07/2019), 70 mg (02/10/2020), 70 mg (03/09/2020), 70 mg (04/06/2020), 70 mg (05/04/2020)  for chemotherapy treatment.    Urothelial cancer ([place])  12/18/2017 Initial Diagnosis   Urothelial cancer ([place])   06/17/2018 - 12/24/2018 Chemotherapy   The patient had atezolizumab (TECENTRIQ) 1,200 mg in sodium chloride 0.9 % 250 mL chemo infusion, 1,200 mg, Intravenous, Once, 10 of 10 cycles Administration: 1,200 mg (06/17/2018), 1,200 mg (07/05/2018), 1,200 mg (07/29/2018), 1,200 mg (08/19/2018), 1,200  mg (09/30/2018), 1,200 mg (09/09/2018), 1,200 mg (10/21/2018), 1,200 mg (11/11/2018), 1,200 mg (12/03/2018), 1,200 mg (12/24/2018)  for chemotherapy treatment.      ALLERGIES:  is allergic to sulfa antibiotics, ace inhibitors, invokana [canagliflozin], and penicillins.  MEDICATIONS:  Current Outpatient Medications  Medication Sig Dispense Refill   aspirin EC 81 MG tablet Take 81 mg by mouth every evening.      atorvastatin (LIPITOR) 40 MG tablet TAKE 1 TABLET BY MOUTH AT BEDTIME 90 tablet 4   Cholecalciferol (VITAMIN D3) 25 MCG (1000 UT) CAPS Take 1 capsule by mouth daily.     clindamycin (CLEOCIN) 150 MG capsule Take 300 mg by mouth 4 (four) times daily.      clobetasol cream (TEMOVATE) 1.61 % Apply 1 application topically as needed (for skin rash). 30 g 1   empagliflozin (JARDIANCE) 10 MG TABS tablet Take 1 tablet (10 mg total) by mouth daily. 90 tablet 2   fluticasone (FLONASE) 50 MCG/ACT nasal spray Use 2 spray(s) in each nostril once daily (Patient taking differently: Place 1 spray into both nostrils daily as needed. ) 48 g 3   glucose blood (CONTOUR NEXT TEST) test strip 1 each by Other route daily. USE 1 STRIP TO CHECK GLUCOSE ONCE DAILY 100 each 3   hydrOXYzine (  ATARAX/VISTARIL) 25 MG tablet Take 1 tablet (25 mg total) by mouth every 8 (eight) hours as needed for itching. 30 tablet 3   Ivermectin (SOOLANTRA) 1 % CREA Apply 1 application topically daily as needed (rosacea). To face (Patient not taking: Reported on 06/01/2020)     lidocaine-prilocaine (EMLA) cream Apply 1 application topically as needed (port access). (Patient not taking: Reported on 06/01/2020) 1 g 3   losartan (COZAAR) 50 MG tablet Take 1 tablet by mouth once daily 90 tablet 0   magic mouthwash w/lidocaine SOLN Take 5 mLs by mouth 4 (four) times daily as needed for mouth pain. (Patient not taking: Reported on 06/01/2020) 480 mL 3   magnesium hydroxide (MILK OF MAGNESIA) 400 MG/5ML suspension Take 15 mLs by mouth  daily as needed for mild constipation.     metFORMIN (GLUCOPHAGE) 1000 MG tablet TAKE 1 TABLET BY MOUTH TWICE DAILY WITH MEALS 180 tablet 0   nystatin cream (MYCOSTATIN) Apply topically 2 (two) times daily. 15 g 1   OVER THE COUNTER MEDICATION Apply 1 application topically daily as needed (pain). Outback topical pain relief      pregabalin (LYRICA) 75 MG capsule Take 1 capsule by mouth once daily 30 capsule 0   sertraline (ZOLOFT) 100 MG tablet Take 1 tablet by mouth once daily 90 tablet 1   vitamin B-12 (CYANOCOBALAMIN) 1000 MCG tablet Take by mouth daily. Unsure dose     vitamin C (ASCORBIC ACID) 500 MG tablet Take 1,000-1,500 mg by mouth daily as needed (immune support).      No current facility-administered medications for this visit.   Facility-Administered Medications Ordered in Other Visits  Medication Dose Route Frequency Provider Last Rate Last Admin   enfortumab vedotin-ejfv (PADCEV) 70 mg in sodium chloride 0.9 % 50 mL (1.2281 mg/mL) chemo infusion  1 mg/kg (Treatment Plan Recorded) Intravenous Once Sindy Guadeloupe, MD       heparin lock flush 100 unit/mL  500 Units Intracatheter Once PRN Sindy Guadeloupe, MD       Influenza vac split quadrivalent PF (FLUZONE HIGH-DOSE) injection 0.5 mL  0.5 mL Intramuscular Once Karen Kitchens, NP        VITAL SIGNS: There were no vitals taken for this visit. There were no vitals filed for this visit.  Estimated body mass index is 24.34 kg/m as calculated from the following:   Height as of 04/27/20: 5\' 8"  (1.727 m).   Weight as of an earlier encounter on 06/01/20: 160 lb 1.6 oz (72.6 kg).  LABS: CBC:    Component Value Date/Time   WBC 6.7 06/01/2020 1258   HGB 12.7 (L) 06/01/2020 1258   HGB 13.7 03/15/2017 1042   HCT 37.8 (L) 06/01/2020 1258   HCT 39.7 03/15/2017 1042   PLT 209 06/01/2020 1258   PLT 270 03/15/2017 1042   MCV 88.1 06/01/2020 1258   MCV 89 03/15/2017 1042   MCV 89 02/03/2013 2223   NEUTROABS 4.5 06/01/2020 1258    NEUTROABS 4.6 03/15/2017 1042   LYMPHSABS 1.3 06/01/2020 1258   LYMPHSABS 1.2 03/15/2017 1042   MONOABS 0.6 06/01/2020 1258   EOSABS 0.3 06/01/2020 1258   EOSABS 0.3 03/15/2017 1042   BASOSABS 0.1 06/01/2020 1258   BASOSABS 0.0 03/15/2017 1042   Comprehensive Metabolic Panel:    Component Value Date/Time   NA 136 06/01/2020 1258   NA 137 03/15/2017 1042   NA 137 02/03/2013 2223   K 4.2 06/01/2020 1258   K 4.0 02/03/2013 2223  CL 101 06/01/2020 1258   CL 106 02/03/2013 2223   CO2 23 06/01/2020 1258   CO2 28 02/03/2013 2223   BUN 18 06/01/2020 1258   BUN 9 03/15/2017 1042   BUN 11 02/03/2013 2223   CREATININE 0.63 06/01/2020 1258   CREATININE 0.69 02/03/2013 2223   GLUCOSE 159 (H) 06/01/2020 1258   GLUCOSE 158 (H) 02/03/2013 2223   CALCIUM 9.7 06/01/2020 1258   CALCIUM 9.3 02/03/2013 2223   AST 18 06/01/2020 1258   AST 26 02/03/2013 2223   ALT 12 06/01/2020 1258   ALT 30 02/03/2013 2223   ALKPHOS 84 06/01/2020 1258   ALKPHOS 121 02/03/2013 2223   BILITOT 0.8 06/01/2020 1258   BILITOT 1.5 (H) 03/15/2017 1042   BILITOT 1.1 (H) 02/03/2013 2223   PROT 7.5 06/01/2020 1258   PROT 6.9 03/15/2017 1042   PROT 7.5 02/03/2013 2223   ALBUMIN 4.0 06/01/2020 1258   ALBUMIN 4.3 03/15/2017 1042   ALBUMIN 3.3 (L) 02/03/2013 2223    RADIOGRAPHIC STUDIES: No results found.  PERFORMANCE STATUS (ECOG) : 2 - Symptomatic, <50% confined to bed  Review of Systems Unless otherwise noted, a complete review of systems is negative.  Physical Exam General: NAD, frail appearing, thin Pulmonary: Unlabored Extremities: no edema, no joint deformities Skin: no rashes Neurological: Weakness but otherwise nonfocal  IMPRESSION: Routine follow-up visit today.  Patient was seen in the infusion area.  Patient reports that he is doing reasonably well.  He denies any significant changes or concerns.  No symptomatic complaints at present.  Patient reports stable performance status.  He has  good oral intake and has had uptrending weight.  Patient had two broken teeth recently requiring extraction by dentist but seems to be doing well afterwards.  Patient reports that he has a positive outlook and strong faith.  He seems to be tolerating treatments well.  PLAN: -Continue current scope of treatment -Follow-up MyChart visit 1 to 2 months   Patient expressed understanding and was in agreement with this plan. He also understands that He can call the clinic at any time with any questions, concerns, or complaints.     Time Total: 15 minutes  Visit consisted of counseling and education dealing with the complex and emotionally intense issues of symptom management and palliative care in the setting of serious and potentially life-threatening illness.Greater than 50%  of this time was spent counseling and coordinating care related to the above assessment and plan.  Signed by: Altha Harm, PhD, NP-C [phone] (Work Cell)

## 2020-06-01 NOTE — Progress Notes (Signed)
Pt doing good. Some neuropathy but doing pretty good with it. Sleeps well, gained wt and eating and drinking good. He would like to drink occ. Beer. He had 2 teeth extracted and on atb for it.

## 2020-06-01 NOTE — Progress Notes (Signed)
Pt received padcev infusion today per order. Tolerated well. Ambulatory at discharge.

## 2020-06-06 NOTE — Progress Notes (Signed)
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 [name] so far for his metastatic urothelial carcinoma. This was originally diagnosed in May 2019. He was noted to have a filling defect in the lower pole collecting system of the left kidney along with para-aortic and retroperitoneal adenopathy concerning for metastatic disease. He underwent left ureteroscopy and renal pelvis biopsy which revealed small fragments of high-grade urothelial carcinoma with small focus of invasion. He was started on carboplatin and gemcitabine chemotherapy in June 2019 and was continued on 05/27/2018 for 8 cycles. He tolerated chemotherapy well except for chemo-induced anemia for which she has been getting Procrit every 2 weeks. He did have response to his disease based on scans in August 2019. However repeat scan on 05/31/2018 showed increase in the size of the primary tumor from 1.2 to 1.9 cm and increase in left para-aortic adenopathy from 0.9 to 1.3 cm. Periportal adenopathy was stable at 1.4 cm. Second line immunotherapy was recommended. His initial biopsy specimen did not have enough sample to undergo FGFR mutation testing. He also has mild interstitial lung disease for which he sees  pulmonary but he is not on home oxygen. He has B12 deficiency for which he is on oral B12. Also has diabetes and coronary artery disease.Tecentriq started on 06/17/2018.Patient noted to have disease progression in his lymph nodes in May 2020. Repeat biopsy showed metastatic urothelial carcinoma which did not have aFGFR mutation. He has been started on third line Padcev  Treatment on hold since April 2021 due to neuropathy and to maintain quality of lifebut restarted in August 2021after he was found to have progression of disease  Interval history-patient reports tolerating Paxil well without any significant side effects.  He does have baseline fatigue which has not changed but overall he has a decent quality of life and remains independent of his ADLs.  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 Negative for chills, fever and weight loss.  HENT: Negative for congestion, ear discharge and nosebleeds.   Eyes: Negative for blurred vision.  Respiratory: Negative for cough, hemoptysis, sputum production, shortness of breath and wheezing.   Cardiovascular: Negative for chest pain, palpitations, orthopnea and claudication.  Gastrointestinal: Negative for abdominal pain, blood in stool, constipation, diarrhea, heartburn, melena, nausea and vomiting.  Genitourinary: Negative for dysuria, flank pain, frequency, hematuria and urgency.  Musculoskeletal: Negative for back pain, joint pain and myalgias.  Skin: Negative for rash.  Neurological: Negative for dizziness, tingling, focal weakness, seizures, weakness and headaches.  Endo/Heme/Allergies: Does not bruise/bleed easily.  Psychiatric/Behavioral: Negative for depression and suicidal ideas. The patient does not have insomnia.        Allergies  Allergen Reactions  . Sulfa Antibiotics Other (See Comments)    Joint pain  . Ace  Inhibitors Cough  . Invokana [Canagliflozin] Other (See Comments)    Leg pain  . Penicillins Rash    Did it involve swelling of the face/tongue/throat, SOB, or low BP? No Did it involve sudden or severe rash/hives, skin peeling, or any reaction on the inside of your mouth or nose? Yes Did you need to seek medical attention at a hospital or doctor's office? Yes When did it last happen?childhood allergy If all above answers are "NO", may proceed with cephalosporin use.      Past Medical History:  Diagnosis Date  . Acid reflux   . Anxiety   . Depression   . Diabetes mellitus without complication ([place])   . Hyperlipidemia   . Hypertension   . MRSA (methicillin resistant Staphylococcus aureus) infection 1992   history  . Myocardial infarction ([place]) 1995  . Sleep apnea    CPAP  . Urothelial cancer ([place]) 11/2017   Left Urothelial mass, chemo tx's     Past Surgical History:  Procedure Laterality Date  . CATARACT EXTRACTION Left   . COLONOSCOPY  2010   [facility]  . COLONOSCOPY WITH PROPOFOL N/A 10/04/2016   Procedure: COLONOSCOPY WITH PROPOFOL;  Surgeon: [name], MD;  Location: [facility] ENDOSCOPY;  Service: Endoscopy;  Laterality: N/A;  . [place], 2000, 2001, 2014  . CYSTOSCOPY W/ RETROGRADES Bilateral 12/10/2017   Procedure: CYSTOSCOPY WITH RETROGRADE PYELOGRAM;  Surgeon: [name], MD;  Location: [facility] ORS;  Service: Urology;  Laterality: Bilateral;  . CYSTOSCOPY W/ RETROGRADES Left 12/09/2018   Procedure: CYSTOSCOPY WITH RETROGRADE PYELOGRAM;  Surgeon: [name], MD;  Location: [facility] ORS;  Service: Urology;  Laterality: Left;  . CYSTOSCOPY WITH BIOPSY Left 12/09/2018   Procedure: CYSTOSCOPY WITH URETERAL/RENAL PELVIC BIOPSY;  Surgeon: [name], MD;  Location: [facility] ORS;  Service: Urology;  Laterality: Left;  . CYSTOSCOPY WITH STENT PLACEMENT Left 12/10/2017   Procedure: CYSTOSCOPY WITH STENT PLACEMENT;  Surgeon:  [name], MD;  Location: [facility] ORS;  Service: Urology;  Laterality: Left;  . CYSTOSCOPY WITH STENT PLACEMENT Left 12/09/2018   Procedure: CYSTOSCOPY WITH STENT PLACEMENT;  Surgeon: [name], MD;  Location: [facility] ORS;  Service: Urology;  Laterality: Left;  . CYSTOSCOPY WITH URETEROSCOPY Left 12/09/2018   Procedure: CYSTOSCOPY WITH URETEROSCOPY;  Surgeon: [name], MD;  Location: [facility] ORS;  Service: Urology;  Laterality: Left;  . EYE SURGERY Bilateral    cataract  . INGUINAL HERNIA REPAIR Right 08/09/2015   Procedure: HERNIA REPAIR INGUINAL ADULT;  Surgeon: [name], MD;  Location: [facility] ORS;  Service: General;  Laterality: Right;  . NASAL SINUS SURGERY    . nuclear stress test    . PORTA CATH INSERTION N/A 12/19/2017   Procedure: PORTA CATH INSERTION;  Surgeon: [name], MD;  Location: [place] CV LAB;  Service: Cardiovascular;  Laterality: N/A;  . TOOTH EXTRACTION  05/28/2020   upper left and it was molars  . URETERAL BIOPSY Left 12/10/2017   Procedure: URETERAL & renal PELVIS BIOPSY;  Surgeon: [name], MD;  Location: [facility] ORS;  Service: Urology;  Laterality: Left;  . URETEROSCOPY Left 12/10/2017   Procedure: URETEROSCOPY;  Surgeon: [name], MD;  Location: [facility] ORS;  Service: Urology;  Laterality: Left;    Social History   Socioeconomic History  . Marital status: Married    Spouse name: [name]  . Number of children: 1  . Years of education: Not on file  . Highest education level: Associate degree:  occupational, Hotel manager, or vocational program  Occupational History  . Occupation: retired  Tobacco Use  . Smoking status: Former Smoker    Types: Cigars    Quit date: 10/09/1978    Years since quitting: 41.6  . Smokeless tobacco: Former Systems developer    Types: Chew    Quit date: 12/04/1988  . Tobacco comment: on occasion  Vaping Use  . Vaping Use: Never used  Substance and Sexual Activity  . Alcohol use: Not Currently  . Drug use: No  . Sexual  activity: Not Currently  Other Topics Concern  . Not on file  Social History Narrative  . Not on file   Social Determinants of Health   Financial Resource Strain: Low Risk   . Difficulty of Paying Living Expenses: Not hard at all  Food Insecurity: No Food Insecurity  . Worried About Charity fundraiser in the Last Year: Never true  . Ran Out of Food in the Last Year: Never true  Transportation Needs: No Transportation Needs  . Lack of Transportation (Medical): No  . Lack of Transportation (Non-Medical): No  Physical Activity: Insufficiently Active  . Days of Exercise per Week: 3 days  . Minutes of Exercise per Session: 30 min  Stress: No Stress Concern Present  . Feeling of Stress : Not at all  Social Connections: Moderately Integrated  . Frequency of Communication with Friends and Family: Three times a week  . Frequency of Social Gatherings with Friends and Family: More than three times a week  . Attends Religious Services: More than 4 times per year  . Active Member of Clubs or Organizations: No  . Attends Archivist Meetings: Never  . Marital Status: Married  Human resources officer Violence: Not At Risk  . Fear of Current or Ex-Partner: No  . Emotionally Abused: No  . Physically Abused: No  . Sexually Abused: No    Family History  Problem Relation Age of Onset  . Heart disease Mother   . Cancer Father        Lung and colon cancer  . Heart disease Father   . Emphysema Maternal Grandfather   . Tuberculosis Maternal Grandmother      Current Outpatient Medications:  .  aspirin EC 81 MG tablet, Take 81 mg by mouth every evening. , Disp: , Rfl:  .  atorvastatin (LIPITOR) 40 MG tablet, TAKE 1 TABLET BY MOUTH AT BEDTIME, Disp: 90 tablet, Rfl: 4 .  Cholecalciferol (VITAMIN D3) 25 MCG (1000 UT) CAPS, Take 1 capsule by mouth daily., Disp: , Rfl:  .  clindamycin (CLEOCIN) 150 MG capsule, Take 300 mg by mouth 4 (four) times daily. , Disp: , Rfl:  .  clobetasol cream  (TEMOVATE) 7.49 %, Apply 1 application topically as needed (for skin rash)., Disp: 30 g, Rfl: 1 .  empagliflozin (JARDIANCE) 10 MG TABS tablet, Take 1 tablet (10 mg total) by mouth daily., Disp: 90 tablet, Rfl: 2 .  fluticasone (FLONASE) 50 MCG/ACT nasal spray, Use 2 spray(s) in each nostril once daily (Patient taking differently: Place 1 spray into both nostrils daily as needed. ), Disp: 48 g, Rfl: 3 .  glucose blood (CONTOUR NEXT TEST) test strip, 1 each by Other route daily. USE 1 STRIP TO CHECK GLUCOSE ONCE DAILY, Disp: 100 each, Rfl: 3 .  hydrOXYzine (ATARAX/VISTARIL) 25 MG tablet, Take 1 tablet (25 mg total) by mouth every 8 (eight) hours as needed for itching., Disp: 30 tablet, Rfl: 3 .  losartan (COZAAR) 50  MG tablet, Take 1 tablet by mouth once daily, Disp: 90 tablet, Rfl: 0 .  magnesium hydroxide (MILK OF MAGNESIA) 400 MG/5ML suspension, Take 15 mLs by mouth daily as needed for mild constipation., Disp: , Rfl:  .  metFORMIN (GLUCOPHAGE) 1000 MG tablet, TAKE 1 TABLET BY MOUTH TWICE DAILY WITH MEALS, Disp: 180 tablet, Rfl: 0 .  nystatin cream (MYCOSTATIN), Apply topically 2 (two) times daily., Disp: 15 g, Rfl: 1 .  OVER THE COUNTER MEDICATION, Apply 1 application topically daily as needed (pain). Outback topical pain relief , Disp: , Rfl:  .  pregabalin (LYRICA) 75 MG capsule, Take 1 capsule by mouth once daily, Disp: 30 capsule, Rfl: 0 .  sertraline (ZOLOFT) 100 MG tablet, Take 1 tablet by mouth once daily, Disp: 90 tablet, Rfl: 1 .  vitamin B-12 (CYANOCOBALAMIN) 1000 MCG tablet, Take by mouth daily. Unsure dose, Disp: , Rfl:  .  vitamin C (ASCORBIC ACID) 500 MG tablet, Take 1,000-1,500 mg by mouth daily as needed (immune support). , Disp: , Rfl:  .  Ivermectin (SOOLANTRA) 1 % CREA, Apply 1 application topically daily as needed (rosacea). To face (Patient not taking: Reported on 06/01/2020), Disp: , Rfl:  .  lidocaine-prilocaine (EMLA) cream, Apply 1 application topically as needed (port  access). (Patient not taking: Reported on 06/01/2020), Disp: 1 g, Rfl: 3 .  magic mouthwash w/lidocaine SOLN, Take 5 mLs by mouth 4 (four) times daily as needed for mouth pain. (Patient not taking: Reported on 06/01/2020), Disp: 480 mL, Rfl: 3 No current facility-administered medications for this visit.  Facility-Administered Medications Ordered in Other Visits:  .  Influenza vac split quadrivalent PF (FLUZONE HIGH-DOSE) injection 0.5 mL, 0.5 mL, Intramuscular, Once, Karen Kitchens, NP  Physical exam:  Vitals:   06/01/20 1323  BP: 130/90  Pulse: 88  Temp: (!) 96.2 F (35.7 C)  TempSrc: Tympanic  SpO2: 96%  Weight: 160 lb 1.6 oz (72.6 kg)   Physical Exam Constitutional:      General: He is not in acute distress. Cardiovascular:     Rate and Rhythm: Normal rate and regular rhythm.     Heart sounds: Normal heart sounds.  Pulmonary:     Effort: Pulmonary effort is normal.     Breath sounds: Normal breath sounds.  Abdominal:     General: Bowel sounds are normal.     Palpations: Abdomen is soft.  Skin:    General: Skin is warm and dry.  Neurological:     Mental Status: He is alert and oriented to person, place, and time.      CMP Latest Ref Rng & Units 06/01/2020  Glucose 70 - 99 mg/dL 159(H)  BUN 8 - 23 mg/dL 18  Creatinine 0.61 - 1.24 mg/dL 0.63  Sodium 135 - 145 mmol/L 136  Potassium 3.5 - 5.1 mmol/L 4.2  Chloride 98 - 111 mmol/L 101  CO2 22 - 32 mmol/L 23  Calcium 8.9 - 10.3 mg/dL 9.7  Total Protein 6.5 - 8.1 g/dL 7.5  Total Bilirubin 0.3 - 1.2 mg/dL 0.8  Alkaline Phos 38 - 126 U/L 84  AST 15 - 41 U/L 18  ALT 0 - 44 U/L 12   CBC Latest Ref Rng & Units 06/01/2020  WBC 4.0 - 10.5 K/uL 6.7  Hemoglobin 13.0 - 17.0 g/dL 12.7(L)  Hematocrit 39 - 52 % 37.8(L)  Platelets 150 - 400 K/uL 209    Assessment and plan- Patient is a 74 y.o. male with history of metastatic upper  urothelial carcinoma with metastases to the lymph nodes.    He is here for on treatment assessment  prior to next cycle of Padcev  Counts okay to proceed with next cycle of Padcev today.  He has been getting it 1 week on 3 weeks of to maintain his quality of life.  I will see him back in 4 weeks for the next cycle.  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 Encounter for antineoplastic chemotherapy   2. Urothelial carcinoma of kidney, left ([place])   3. Chemotherapy-induced peripheral neuropathy ([place])      Dr. Randa Evens, MD, MPH [facility] [phone] 06/06/2020 7:28 PM

## 2020-07-01 ENCOUNTER — Inpatient Hospital Stay: Payer: Medicare Other

## 2020-07-01 ENCOUNTER — Inpatient Hospital Stay: Payer: Medicare Other | Attending: Oncology

## 2020-07-01 ENCOUNTER — Encounter: Payer: Self-pay | Admitting: Oncology

## 2020-07-01 ENCOUNTER — Inpatient Hospital Stay (HOSPITAL_BASED_OUTPATIENT_CLINIC_OR_DEPARTMENT_OTHER): Payer: Medicare Other | Admitting: Oncology

## 2020-07-01 ENCOUNTER — Telehealth: Payer: Self-pay

## 2020-07-01 ENCOUNTER — Other Ambulatory Visit: Payer: Self-pay

## 2020-07-01 VITALS — BP 137/87 | HR 90 | Temp 96.0°F | Resp 18 | Wt 160.6 lb

## 2020-07-01 DIAGNOSIS — Z5112 Encounter for antineoplastic immunotherapy: Secondary | ICD-10-CM | POA: Diagnosis not present

## 2020-07-01 DIAGNOSIS — C642 Malignant neoplasm of left kidney, except renal pelvis: Secondary | ICD-10-CM

## 2020-07-01 DIAGNOSIS — I2510 Atherosclerotic heart disease of native coronary artery without angina pectoris: Secondary | ICD-10-CM

## 2020-07-01 DIAGNOSIS — Z5111 Encounter for antineoplastic chemotherapy: Secondary | ICD-10-CM

## 2020-07-01 DIAGNOSIS — D701 Agranulocytosis secondary to cancer chemotherapy: Secondary | ICD-10-CM

## 2020-07-01 LAB — CBC WITH DIFFERENTIAL/PLATELET
Abs Immature Granulocytes: 0.02 10*3/uL (ref 0.00–0.07)
Basophils Absolute: 0.1 10*3/uL (ref 0.0–0.1)
Basophils Relative: 1 %
Eosinophils Absolute: 0.3 10*3/uL (ref 0.0–0.5)
Eosinophils Relative: 4 %
HCT: 38.2 % — ABNORMAL LOW (ref 39.0–52.0)
Hemoglobin: 12.7 g/dL — ABNORMAL LOW (ref 13.0–17.0)
Immature Granulocytes: 0 %
Lymphocytes Relative: 23 %
Lymphs Abs: 1.9 10*3/uL (ref 0.7–4.0)
MCH: 29.3 pg (ref 26.0–34.0)
MCHC: 33.2 g/dL (ref 30.0–36.0)
MCV: 88.2 fL (ref 80.0–100.0)
Monocytes Absolute: 0.7 10*3/uL (ref 0.1–1.0)
Monocytes Relative: 8 %
Neutro Abs: 5.6 10*3/uL (ref 1.7–7.7)
Neutrophils Relative %: 64 %
Platelets: 209 10*3/uL (ref 150–400)
RBC: 4.33 MIL/uL (ref 4.22–5.81)
RDW: 14.7 % (ref 11.5–15.5)
WBC: 8.6 10*3/uL (ref 4.0–10.5)
nRBC: 0 % (ref 0.0–0.2)

## 2020-07-01 LAB — COMPREHENSIVE METABOLIC PANEL
ALT: 11 U/L (ref 0–44)
AST: 17 U/L (ref 15–41)
Albumin: 4.1 g/dL (ref 3.5–5.0)
Alkaline Phosphatase: 90 U/L (ref 38–126)
Anion gap: 10 (ref 5–15)
BUN: 21 mg/dL (ref 8–23)
CO2: 25 mmol/L (ref 22–32)
Calcium: 9.7 mg/dL (ref 8.9–10.3)
Chloride: 102 mmol/L (ref 98–111)
Creatinine, Ser: 0.71 mg/dL (ref 0.61–1.24)
GFR, Estimated: >60 mL/min (ref >60)
Glucose, Bld: 142 mg/dL — ABNORMAL HIGH (ref 70–99)
Potassium: 3.9 mmol/L (ref 3.5–5.1)
Sodium: 137 mmol/L (ref 135–145)
Total Bilirubin: 1 mg/dL (ref 0.3–1.2)
Total Protein: 7.5 g/dL (ref 6.5–8.1)

## 2020-07-01 MED ORDER — PALONOSETRON HCL INJECTION 0.25 MG/5ML
0.2500 mg | Freq: Once | INTRAVENOUS | Status: AC
  Administered 2020-07-01: 12:00:00 0.25 mg via INTRAVENOUS
  Filled 2020-07-01: qty 5

## 2020-07-01 MED ORDER — SODIUM CHLORIDE 0.9% FLUSH
10.0000 mL | INTRAVENOUS | Status: DC | PRN
  Administered 2020-07-01: 10:00:00 10 mL via INTRAVENOUS
  Filled 2020-07-01: qty 10

## 2020-07-01 MED ORDER — SODIUM CHLORIDE 0.9 % IV SOLN
Freq: Once | INTRAVENOUS | Status: AC
  Filled 2020-07-01: qty 250

## 2020-07-01 MED ORDER — HEPARIN SOD (PORK) LOCK FLUSH 100 UNIT/ML IV SOLN
INTRAVENOUS | Status: AC
  Filled 2020-07-01: qty 5

## 2020-07-01 MED ORDER — SODIUM CHLORIDE 0.9 % IV SOLN
1.0000 mg/kg | Freq: Once | INTRAVENOUS | Status: AC
  Administered 2020-07-01: 12:00:00 70 mg via INTRAVENOUS
  Filled 2020-07-01: qty 3

## 2020-07-01 MED ORDER — HEPARIN SOD (PORK) LOCK FLUSH 100 UNIT/ML IV SOLN
500.0000 [IU] | Freq: Once | INTRAVENOUS | Status: AC
  Administered 2020-07-01: 13:00:00 500 [IU] via INTRAVENOUS
  Filled 2020-07-01: qty 5

## 2020-07-01 NOTE — Telephone Encounter (Signed)
Copied from [place] [id number]. Topic: General - Inquiry >> Jul 01, 2020 12:02 PM Gillis Ends D wrote: Reason for CRM: Patient called and wanted to get a covid booster. He would like a call back when its scheduled. He can be reached at [phone]. Please advise

## 2020-07-01 NOTE — Telephone Encounter (Signed)
Copied from [place] [id number]. Topic: General - Inquiry >> Jul 01, 2020  2:40 PM Gillis Ends D wrote: Reason for CRM: Patient would like an appointment for a Covid Booster. He can be reached at [phone]. Please advise

## 2020-07-01 NOTE — Telephone Encounter (Signed)
Please schedule. Thanks! 

## 2020-07-01 NOTE — Progress Notes (Signed)
Pt is doing good, appetite is the same and he is a night owl so he sleeps til noon and eats through the rest of day and in the night. He has no c/o today

## 2020-07-01 NOTE — Progress Notes (Signed)
Able at discharge

## 2020-07-01 NOTE — Progress Notes (Signed)
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 [name] so far for his metastatic urothelial carcinoma. This was originally diagnosed in May 2019. He was noted to have a filling defect in the lower pole collecting system of the left kidney along with para-aortic and retroperitoneal adenopathy concerning for metastatic disease. He underwent left ureteroscopy and renal pelvis biopsy which revealed small fragments of high-grade urothelial carcinoma with small focus of invasion. He was started on carboplatin and gemcitabine chemotherapy in June 2019 and was continued on 05/27/2018 for 8 cycles. He tolerated chemotherapy well except for chemo-induced anemia for which she has been getting Procrit every 2 weeks. He did have response to his disease based on scans in August 2019. However repeat scan on 05/31/2018 showed increase in the size of the primary tumor from 1.2 to 1.9 cm and increase in left para-aortic adenopathy from 0.9 to 1.3 cm. Periportal adenopathy was stable at 1.4 cm. Second line immunotherapy was recommended. His initial biopsy specimen did not have enough sample to undergo FGFR mutation testing. He also has mild interstitial lung disease for which he sees  pulmonary but he is not on home oxygen. He has B12 deficiency for which he is on oral B12. Also has diabetes and coronary artery disease.Tecentriq started on 06/17/2018.Patient noted to have disease progression in his lymph nodes in May 2020. Repeat biopsy showed metastatic urothelial carcinoma which did not have aFGFR mutation. He has been started on third line Padcev  Treatment on hold since April 2021 due to neuropathy and to maintain quality of lifebut restarted in August 2021after he was found to have progression of disease which he has 1 week on 3 weeks of  Interval history-tolerating treatment well without any significant side effects.  He does tend to sleep for a large part of his day.  He is independent of his ADLs.  Reports neuropathy is currently well controlled with Lyrica.  ECOG PS- 1 Pain scale- 0 Opioid associated constipation- no  Review of systems- Review of Systems  Constitutional: Positive for malaise/fatigue. Negative for chills, fever and weight loss.  HENT: Negative for congestion, ear discharge and nosebleeds.   Eyes: Negative for blurred vision.  Respiratory: Negative for cough, hemoptysis, sputum production, shortness of breath and wheezing.   Cardiovascular: Negative for chest pain, palpitations, orthopnea and claudication.  Gastrointestinal: Negative for abdominal pain, blood in stool, constipation, diarrhea, heartburn, melena, nausea and vomiting.  Genitourinary: Negative for dysuria, flank pain, frequency, hematuria and urgency.  Musculoskeletal: Negative for back pain, joint pain and myalgias.  Skin: Negative for rash.  Neurological: Positive for sensory change (Peripheral neuropathy). Negative for dizziness, tingling, focal weakness, seizures, weakness and headaches.  Endo/Heme/Allergies: Does not bruise/bleed easily.  Psychiatric/Behavioral: Negative for depression and suicidal ideas. The patient does not have insomnia.      Allergies  Allergen  Reactions  .  Sulfa Antibiotics Other (See Comments)    Joint pain  . Ace Inhibitors Cough  . Invokana [Canagliflozin] Other (See Comments)    Leg pain  . Penicillins Rash    Did it involve swelling of the face/tongue/throat, SOB, or low BP? No Did it involve sudden or severe rash/hives, skin peeling, or any reaction on the inside of your mouth or nose? Yes Did you need to seek medical attention at a hospital or doctor's office? Yes When did it last happen?childhood allergy If all above answers are "NO", may proceed with cephalosporin use.      Past Medical History:  Diagnosis Date  . Acid reflux   . Anxiety   . Depression   . Diabetes mellitus without complication ([place])   . Hyperlipidemia   . Hypertension   . MRSA (methicillin resistant Staphylococcus aureus) infection 1992   history  . Myocardial infarction ([place]) 1995  . Sleep apnea    CPAP  . Urothelial cancer ([place]) 11/2017   Left Urothelial mass, chemo tx's     Past Surgical History:  Procedure Laterality Date  . CATARACT EXTRACTION Left   . COLONOSCOPY  2010   [facility]  . COLONOSCOPY WITH PROPOFOL N/A 10/04/2016   Procedure: COLONOSCOPY WITH PROPOFOL;  Surgeon: [name], MD;  Location: [facility] ENDOSCOPY;  Service: Endoscopy;  Laterality: N/A;  . [place], 2000, 2001, 2014  . CYSTOSCOPY W/ RETROGRADES Bilateral 12/10/2017   Procedure: CYSTOSCOPY WITH RETROGRADE PYELOGRAM;  Surgeon: [name], MD;  Location: [facility] ORS;  Service: Urology;  Laterality: Bilateral;  . CYSTOSCOPY W/ RETROGRADES Left 12/09/2018   Procedure: CYSTOSCOPY WITH RETROGRADE PYELOGRAM;  Surgeon: [name], MD;  Location: [facility] ORS;  Service: Urology;  Laterality: Left;  . CYSTOSCOPY WITH BIOPSY Left 12/09/2018   Procedure: CYSTOSCOPY WITH URETERAL/RENAL PELVIC BIOPSY;  Surgeon: [name], MD;  Location: [facility] ORS;  Service: Urology;  Laterality: Left;  . CYSTOSCOPY WITH STENT PLACEMENT  Left 12/10/2017   Procedure: CYSTOSCOPY WITH STENT PLACEMENT;  Surgeon: [name], MD;  Location: [facility] ORS;  Service: Urology;  Laterality: Left;  . CYSTOSCOPY WITH STENT PLACEMENT Left 12/09/2018   Procedure: CYSTOSCOPY WITH STENT PLACEMENT;  Surgeon: [name], MD;  Location: [facility] ORS;  Service: Urology;  Laterality: Left;  . CYSTOSCOPY WITH URETEROSCOPY Left 12/09/2018   Procedure: CYSTOSCOPY WITH URETEROSCOPY;  Surgeon: [name], MD;  Location: [facility] ORS;  Service: Urology;  Laterality: Left;  . EYE SURGERY Bilateral    cataract  . INGUINAL HERNIA REPAIR Right 08/09/2015   Procedure: HERNIA REPAIR INGUINAL ADULT;  Surgeon: [name], MD;  Location: [facility] ORS;  Service: General;  Laterality: Right;  . NASAL SINUS SURGERY    . nuclear stress test    . PORTA CATH INSERTION N/A 12/19/2017   Procedure: PORTA CATH INSERTION;  Surgeon: [name], MD;  Location: [facility];  Service: Cardiovascular;  Laterality: N/A;  . TOOTH EXTRACTION  05/28/2020   upper left and it was molars  . URETERAL BIOPSY Left 12/10/2017   Procedure: URETERAL & renal PELVIS BIOPSY;  Surgeon: [name], MD;  Location: [facility] ORS;  Service: Urology;  Laterality: Left;  . URETEROSCOPY Left 12/10/2017   Procedure: URETEROSCOPY;  Surgeon: [name], MD;  Location: [facility] ORS;  Service: Urology;  Laterality: Left;    Social History   Socioeconomic History  . Marital status: Married    Spouse name: [name]  . Number of children: 1  .  Years of education: Not on file  . Highest education level: Associate degree: occupational, Hotel manager, or vocational program  Occupational History  . Occupation: retired  Tobacco Use  . Smoking status: Former Smoker    Types: Cigars    Quit date: 10/09/1978    Years since quitting: 41.7  . Smokeless tobacco: Former Systems developer    Types: Chew    Quit date: 12/04/1988  . Tobacco comment: on occasion  Vaping Use  . Vaping Use: Never used  Substance and Sexual  Activity  . Alcohol use: Not Currently  . Drug use: No  . Sexual activity: Not Currently  Other Topics Concern  . Not on file  Social History Narrative  . Not on file   Social Determinants of Health   Financial Resource Strain: Low Risk   . Difficulty of Paying Living Expenses: Not hard at all  Food Insecurity: No Food Insecurity  . Worried About Charity fundraiser in the Last Year: Never true  . Ran Out of Food in the Last Year: Never true  Transportation Needs: No Transportation Needs  . Lack of Transportation (Medical): No  . Lack of Transportation (Non-Medical): No  Physical Activity: Insufficiently Active  . Days of Exercise per Week: 3 days  . Minutes of Exercise per Session: 30 min  Stress: No Stress Concern Present  . Feeling of Stress : Not at all  Social Connections: Moderately Integrated  . Frequency of Communication with Friends and Family: Three times a week  . Frequency of Social Gatherings with Friends and Family: More than three times a week  . Attends Religious Services: More than 4 times per year  . Active Member of Clubs or Organizations: No  . Attends Archivist Meetings: Never  . Marital Status: Married  Human resources officer Violence: Not At Risk  . Fear of Current or Ex-Partner: No  . Emotionally Abused: No  . Physically Abused: No  . Sexually Abused: No    Family History  Problem Relation Age of Onset  . Heart disease Mother   . Cancer Father        Lung and colon cancer  . Heart disease Father   . Emphysema Maternal Grandfather   . Tuberculosis Maternal Grandmother      Current Outpatient Medications:  .  aspirin EC 81 MG tablet, Take 81 mg by mouth every evening. , Disp: , Rfl:  .  atorvastatin (LIPITOR) 40 MG tablet, TAKE 1 TABLET BY MOUTH AT BEDTIME, Disp: 90 tablet, Rfl: 4 .  Cholecalciferol (VITAMIN D3) 25 MCG (1000 UT) CAPS, Take 1 capsule by mouth daily., Disp: , Rfl:  .  clobetasol cream (TEMOVATE) [id number] %, Apply 1 application  topically as needed (for skin rash)., Disp: 30 g, Rfl: 1 .  empagliflozin (JARDIANCE) 10 MG TABS tablet, Take 1 tablet (10 mg total) by mouth daily., Disp: 90 tablet, Rfl: 2 .  glucose blood (CONTOUR NEXT TEST) test strip, 1 each by Other route daily. USE 1 STRIP TO CHECK GLUCOSE ONCE DAILY, Disp: 100 each, Rfl: 3 .  hydrOXYzine (ATARAX/VISTARIL) 25 MG tablet, Take 1 tablet (25 mg total) by mouth every 8 (eight) hours as needed for itching., Disp: 30 tablet, Rfl: 3 .  Ivermectin 1 % CREA, Apply 1 application topically daily as needed (rosacea). To face, Disp: , Rfl:  .  lidocaine-prilocaine (EMLA) cream, Apply 1 application topically as needed (port access)., Disp: 1 g, Rfl: 3 .  losartan (COZAAR) 50 MG tablet, Take 1  tablet by mouth once daily, Disp: 90 tablet, Rfl: 0 .  magnesium hydroxide (MILK OF MAGNESIA) 400 MG/5ML suspension, Take 15 mLs by mouth daily as needed for mild constipation., Disp: , Rfl:  .  metFORMIN (GLUCOPHAGE) 1000 MG tablet, TAKE 1 TABLET BY MOUTH TWICE DAILY WITH MEALS, Disp: 180 tablet, Rfl: 0 .  nystatin cream (MYCOSTATIN), Apply topically 2 (two) times daily. (Patient taking differently: Apply topically 2 (two) times daily as needed.), Disp: 15 g, Rfl: 1 .  OVER THE COUNTER MEDICATION, Apply 1 application topically daily as needed (pain). Outback topical pain relief , Disp: , Rfl:  .  pregabalin (LYRICA) 75 MG capsule, Take 1 capsule by mouth once daily, Disp: 30 capsule, Rfl: 0 .  psyllium (METAMUCIL) 58.6 % powder, Take 1 packet by mouth daily., Disp: , Rfl:  .  sertraline (ZOLOFT) 50 MG tablet, Take 50 mg by mouth daily., Disp: , Rfl:  .  vitamin B-12 (CYANOCOBALAMIN) 1000 MCG tablet, Take by mouth daily. Unsure dose, Disp: , Rfl:  .  vitamin C (ASCORBIC ACID) 500 MG tablet, Take 1,000-1,500 mg by mouth 2 (two) times daily., Disp: , Rfl:  .  fluticasone (FLONASE) 50 MCG/ACT nasal spray, Use 2 spray(s) in each nostril once daily (Patient not taking: Reported on  07/01/2020), Disp: 48 g, Rfl: 3 .  magic mouthwash w/lidocaine SOLN, Take 5 mLs by mouth 4 (four) times daily as needed for mouth pain. (Patient not taking: No sig reported), Disp: 480 mL, Rfl: 3 No current facility-administered medications for this visit.  Facility-Administered Medications Ordered in Other Visits:  .  enfortumab vedotin-ejfv (PADCEV) 70 mg in sodium chloride 0.9 % 50 mL (1.2281 mg/mL) chemo infusion, 1 mg/kg (Treatment Plan Recorded), Intravenous, Once, Sindy Guadeloupe, MD, Last Rate: 114 mL/hr at 07/01/20 1158, 70 mg at 07/01/20 1158 .  heparin lock flush 100 unit/mL, 500 Units, Intravenous, Once, Sindy Guadeloupe, MD .  Influenza vac split quadrivalent PF (FLUZONE HIGH-DOSE) injection 0.5 mL, 0.5 mL, Intramuscular, Once, Honor Loh E, NP .  sodium chloride flush (NS) 0.9 % injection 10 mL, 10 mL, Intravenous, PRN, Sindy Guadeloupe, MD, 10 mL at 07/01/20 0950  Physical exam:  Vitals:   07/01/20 1000  BP: 137/87  Pulse: 90  Resp: 18  Temp: (!) 96 F (35.6 C)  TempSrc: Tympanic  SpO2: 96%  Weight: 160 lb 9.6 oz (72.8 kg)   Physical Exam Constitutional:      General: He is not in acute distress. Eyes:     Extraocular Movements: EOM normal.  Cardiovascular:     Rate and Rhythm: Normal rate and regular rhythm.     Heart sounds: Normal heart sounds.  Pulmonary:     Effort: Pulmonary effort is normal.     Breath sounds: Normal breath sounds.  Abdominal:     General: Bowel sounds are normal.     Palpations: Abdomen is soft.  Skin:    General: Skin is warm and dry.  Neurological:     Mental Status: He is alert and oriented to person, place, and time.      CMP Latest Ref Rng & Units 07/01/2020  Glucose 70 - 99 mg/dL 142(H)  BUN 8 - 23 mg/dL 21  Creatinine 0.61 - 1.24 mg/dL 0.71  Sodium 135 - 145 mmol/L 137  Potassium 3.5 - 5.1 mmol/L 3.9  Chloride 98 - 111 mmol/L 102  CO2 22 - 32 mmol/L 25  Calcium 8.9 - 10.3 mg/dL 9.7  Total Protein  6.5 - 8.1 g/dL 7.5  Total  Bilirubin 0.3 - 1.2 mg/dL 1.0  Alkaline Phos 38 - 126 U/L 90  AST 15 - 41 U/L 17  ALT 0 - 44 U/L 11   CBC Latest Ref Rng & Units 07/01/2020  WBC 4.0 - 10.5 K/uL 8.6  Hemoglobin 13.0 - 17.0 g/dL 12.7(L)  Hematocrit 39.0 - 52.0 % 38.2(L)  Platelets 150 - 400 K/uL 209      Assessment and plan- Patient is a 74 y.o. 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 He is here for on treatment assessment prior to next cycle of Padcev  Counts okay to proceed with Padcev today and I will see him back in 4 weeks for next treatment.  Plan to get repeat CT chest abdomen pelvis with contrast prior to next cycle.  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 Urothelial carcinoma of kidney, left ([place])   2. Encounter for antineoplastic chemotherapy      Dr. Randa Evens, MD, MPH [facility] at [facility] [phone] 07/01/2020 12:27 PM

## 2020-07-13 ENCOUNTER — Other Ambulatory Visit: Payer: Self-pay | Admitting: Oncology

## 2020-07-13 ENCOUNTER — Encounter: Payer: Self-pay | Admitting: Oncology

## 2020-07-21 ENCOUNTER — Ambulatory Visit: Payer: Medicare Other

## 2020-07-21 ENCOUNTER — Other Ambulatory Visit: Payer: Self-pay

## 2020-07-21 DIAGNOSIS — Z1152 Encounter for screening for COVID-19: Secondary | ICD-10-CM

## 2020-07-23 ENCOUNTER — Telehealth: Payer: Self-pay

## 2020-07-23 LAB — NOVEL CORONAVIRUS, NAA: SARS-CoV-2, NAA: NOT DETECTED

## 2020-07-23 LAB — SARS-COV-2, NAA 2 DAY TAT

## 2020-07-23 NOTE — Telephone Encounter (Signed)
Copied from [place] [id number]. Topic: General - Other >> Jul 23, 2020  1:05 PM Celene Kras wrote: Reason for CRM: Pts wife called stating that he is needing to reschedule his appt for his booster. She states that the pts covid test came back negative. Please advise.

## 2020-07-26 ENCOUNTER — Other Ambulatory Visit: Payer: Self-pay | Admitting: Family Medicine

## 2020-07-27 ENCOUNTER — Ambulatory Visit: Payer: Self-pay | Admitting: Family Medicine

## 2020-07-27 NOTE — Telephone Encounter (Signed)
Scheduled on 07/28/20.

## 2020-07-28 ENCOUNTER — Ambulatory Visit: Payer: PRIVATE HEALTH INSURANCE

## 2020-07-29 ENCOUNTER — Ambulatory Visit: Payer: Medicare Other

## 2020-07-29 ENCOUNTER — Ambulatory Visit: Payer: Medicare Other | Admitting: Oncology

## 2020-07-29 ENCOUNTER — Other Ambulatory Visit: Payer: Medicare Other

## 2020-07-30 ENCOUNTER — Other Ambulatory Visit: Payer: Self-pay

## 2020-07-30 ENCOUNTER — Ambulatory Visit
Admission: RE | Admit: 2020-07-30 | Discharge: 2020-07-30 | Disposition: A | Discharge status: Home / Self Care | Payer: HMO | Source: Ambulatory Visit | Attending: Oncology | Admitting: Oncology

## 2020-07-30 DIAGNOSIS — C642 Malignant neoplasm of left kidney, except renal pelvis: Secondary | ICD-10-CM | POA: Insufficient documentation

## 2020-07-30 DIAGNOSIS — Z5111 Encounter for antineoplastic chemotherapy: Secondary | ICD-10-CM | POA: Diagnosis present

## 2020-07-30 MED ORDER — IOHEXOL 300 MG/ML  SOLN
85.0000 mL | Freq: Once | INTRAMUSCULAR | Status: AC | PRN
  Administered 2020-07-30: 85 mL via INTRAVENOUS

## 2020-08-02 ENCOUNTER — Inpatient Hospital Stay: Payer: HMO

## 2020-08-02 ENCOUNTER — Inpatient Hospital Stay: Payer: HMO | Attending: Oncology

## 2020-08-02 ENCOUNTER — Encounter: Payer: Self-pay | Admitting: Oncology

## 2020-08-02 ENCOUNTER — Inpatient Hospital Stay: Payer: HMO | Admitting: Oncology

## 2020-08-02 ENCOUNTER — Inpatient Hospital Stay: Payer: HMO | Admitting: Hospice and Palliative Medicine

## 2020-08-02 VITALS — BP 99/75 | HR 95 | Temp 97.1°F | Resp 17 | Wt 160.0 lb

## 2020-08-02 DIAGNOSIS — Z5111 Encounter for antineoplastic chemotherapy: Secondary | ICD-10-CM

## 2020-08-02 DIAGNOSIS — Z5112 Encounter for antineoplastic immunotherapy: Secondary | ICD-10-CM | POA: Insufficient documentation

## 2020-08-02 DIAGNOSIS — D701 Agranulocytosis secondary to cancer chemotherapy: Secondary | ICD-10-CM

## 2020-08-02 DIAGNOSIS — C642 Malignant neoplasm of left kidney, except renal pelvis: Secondary | ICD-10-CM

## 2020-08-02 DIAGNOSIS — T451X5A Adverse effect of antineoplastic and immunosuppressive drugs, initial encounter: Secondary | ICD-10-CM

## 2020-08-02 LAB — CBC WITH DIFFERENTIAL/PLATELET
Abs Immature Granulocytes: 0.01 10*3/uL (ref 0.00–0.07)
Basophils Absolute: 0 10*3/uL (ref 0.0–0.1)
Basophils Relative: 1 %
Eosinophils Absolute: 0.2 10*3/uL (ref 0.0–0.5)
Eosinophils Relative: 3 %
HCT: 38.2 % — ABNORMAL LOW (ref 39.0–52.0)
Hemoglobin: 13 g/dL (ref 13.0–17.0)
Immature Granulocytes: 0 %
Lymphocytes Relative: 22 %
Lymphs Abs: 1.6 10*3/uL (ref 0.7–4.0)
MCH: 29.4 pg (ref 26.0–34.0)
MCHC: 34 g/dL (ref 30.0–36.0)
MCV: 86.4 fL (ref 80.0–100.0)
Monocytes Absolute: 0.6 10*3/uL (ref 0.1–1.0)
Monocytes Relative: 8 %
Neutro Abs: 4.8 10*3/uL (ref 1.7–7.7)
Neutrophils Relative %: 66 %
Platelets: 193 10*3/uL (ref 150–400)
RBC: 4.42 MIL/uL (ref 4.22–5.81)
RDW: 14.6 % (ref 11.5–15.5)
WBC: 7.3 10*3/uL (ref 4.0–10.5)
nRBC: 0 % (ref 0.0–0.2)

## 2020-08-02 LAB — COMPREHENSIVE METABOLIC PANEL
ALT: 13 U/L (ref 0–44)
AST: 18 U/L (ref 15–41)
Albumin: 4 g/dL (ref 3.5–5.0)
Alkaline Phosphatase: 95 U/L (ref 38–126)
Anion gap: 11 (ref 5–15)
BUN: 22 mg/dL (ref 8–23)
CO2: 25 mmol/L (ref 22–32)
Calcium: 9.7 mg/dL (ref 8.9–10.3)
Chloride: 100 mmol/L (ref 98–111)
Creatinine, Ser: 1.05 mg/dL (ref 0.61–1.24)
GFR, Estimated: >60 mL/min (ref >60)
Glucose, Bld: 145 mg/dL — ABNORMAL HIGH (ref 70–99)
Potassium: 3.9 mmol/L (ref 3.5–5.1)
Sodium: 136 mmol/L (ref 135–145)
Total Bilirubin: 1.1 mg/dL (ref 0.3–1.2)
Total Protein: 7.4 g/dL (ref 6.5–8.1)

## 2020-08-02 MED ORDER — HEPARIN SOD (PORK) LOCK FLUSH 100 UNIT/ML IV SOLN
INTRAVENOUS | Status: AC
  Filled 2020-08-02: qty 5

## 2020-08-02 MED ORDER — SODIUM CHLORIDE 0.9 % IV SOLN
Freq: Once | INTRAVENOUS | Status: AC
  Filled 2020-08-02: qty 250

## 2020-08-02 MED ORDER — SODIUM CHLORIDE 0.9 % IV SOLN
1.0000 mg/kg | Freq: Once | INTRAVENOUS | Status: AC
  Administered 2020-08-02: 70 mg via INTRAVENOUS
  Filled 2020-08-02: qty 3

## 2020-08-02 MED ORDER — HEPARIN SOD (PORK) LOCK FLUSH 100 UNIT/ML IV SOLN
500.0000 [IU] | Freq: Once | INTRAVENOUS | Status: AC
  Administered 2020-08-02: 500 [IU] via INTRAVENOUS
  Filled 2020-08-02: qty 5

## 2020-08-02 MED ORDER — PALONOSETRON HCL INJECTION 0.25 MG/5ML
0.2500 mg | Freq: Once | INTRAVENOUS | Status: AC
  Administered 2020-08-02: 0.25 mg via INTRAVENOUS
  Filled 2020-08-02: qty 5

## 2020-08-02 MED ORDER — SODIUM CHLORIDE 0.9% FLUSH
10.0000 mL | INTRAVENOUS | Status: DC | PRN
  Administered 2020-08-02: 10 mL via INTRAVENOUS
  Filled 2020-08-02: qty 10

## 2020-08-02 NOTE — Progress Notes (Signed)
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 [name] so far for his metastatic urothelial carcinoma. This was originally diagnosed in May 2019. He was noted to have a filling defect in the lower pole collecting system of the left kidney along with para-aortic and retroperitoneal adenopathy concerning for metastatic disease. He underwent left ureteroscopy and renal pelvis biopsy which revealed small fragments of high-grade urothelial carcinoma with small focus of invasion. He was started on carboplatin and gemcitabine chemotherapy in June 2019 and was continued on 05/27/2018 for 8 cycles. He tolerated chemotherapy well except for chemo-induced anemia for which she has been getting Procrit every 2 weeks. He did have response to his disease based on scans in August 2019. However repeat scan on 05/31/2018 showed increase in the size of the primary tumor from 1.2 to 1.9 cm and increase in left para-aortic adenopathy from 0.9 to 1.3 cm. Periportal adenopathy was stable at 1.4 cm. Second line immunotherapy was recommended. His initial biopsy specimen did not have enough sample to undergo FGFR mutation testing. He also has mild interstitial lung  disease for which he sees pulmonary but he is not on home oxygen. He has B12 deficiency for which he is on oral B12. Also has diabetes and coronary artery disease.Tecentriq started on 06/17/2018.Patient noted to have disease progression in his lymph nodes in May 2020. Repeat biopsy showed metastatic urothelial carcinoma which did not have aFGFR mutation. He has been started on third line Padcev  Treatment on hold since April 2021 due to neuropathy and to maintain quality of lifebut restarted in August 2021after he was found to have progression of disease which he has 1 week on 3 weeks of   Interval history-patient is doing well and other than baseline fatigue and peripheral neuropathy he denies other complaints at this time  ECOG PS- 1 Pain scale- 0   Review of systems- Review of Systems  Constitutional: Positive for malaise/fatigue. Negative for chills, fever and weight loss.  HENT: Negative for congestion, ear discharge and nosebleeds.   Eyes: Negative for blurred vision.  Respiratory: Negative for cough, hemoptysis, sputum production, shortness of breath and wheezing.   Cardiovascular: Negative for chest pain, palpitations, orthopnea and claudication.  Gastrointestinal: Negative for abdominal pain, blood in stool, constipation, diarrhea, heartburn, melena, nausea and vomiting.  Genitourinary: Negative for dysuria, flank pain, frequency, hematuria and urgency.  Musculoskeletal: Negative for back pain, joint pain and myalgias.  Skin: Negative for rash.  Neurological: Positive for sensory change (Peripheral neuropathy). Negative for dizziness, tingling, focal weakness, seizures, weakness and headaches.  Endo/Heme/Allergies: Does not bruise/bleed easily.  Psychiatric/Behavioral: Negative for depression and suicidal ideas. The patient does not have insomnia.       Allergies  Allergen Reactions  . Sulfa Antibiotics Other (See Comments)    Joint pain  . Ace Inhibitors Cough  .  Invokana [Canagliflozin] Other (See Comments)    Leg pain  . Penicillins Rash    Did it involve swelling of the face/tongue/throat, SOB, or low BP? No Did it involve sudden or severe rash/hives, skin peeling, or any reaction on the inside of your mouth or nose? Yes Did you need to seek medical attention at a hospital or doctor's office? Yes When did it last happen?childhood allergy If all above answers are "NO", may proceed with cephalosporin use.      Past Medical History:  Diagnosis Date  . Acid reflux   . Anxiety   . Depression   . Diabetes mellitus without complication ([place])   . Hyperlipidemia   . Hypertension   . MRSA (methicillin resistant Staphylococcus aureus) infection 1992   history  . Myocardial infarction ([place]) 1995  . Sleep apnea    CPAP  . Urothelial cancer ([place]) 11/2017   Left Urothelial mass, chemo tx's     Past Surgical History:  Procedure Laterality Date  . CATARACT EXTRACTION Left   . COLONOSCOPY  2010   [facility]  . COLONOSCOPY WITH PROPOFOL N/A 10/04/2016   Procedure: COLONOSCOPY WITH PROPOFOL;  Surgeon: [name], MD;  Location: [facility] ENDOSCOPY;  Service: Endoscopy;  Laterality: N/A;  . [place], 2000, 2001, 2014  . CYSTOSCOPY W/ RETROGRADES Bilateral 12/10/2017   Procedure: CYSTOSCOPY WITH RETROGRADE PYELOGRAM;  Surgeon: [name], MD;  Location: [facility] ORS;  Service: Urology;  Laterality: Bilateral;  . CYSTOSCOPY W/ RETROGRADES Left 12/09/2018   Procedure: CYSTOSCOPY WITH RETROGRADE PYELOGRAM;  Surgeon: [name], MD;  Location: [facility] ORS;  Service: Urology;  Laterality: Left;  . CYSTOSCOPY WITH BIOPSY Left 12/09/2018   Procedure: CYSTOSCOPY WITH URETERAL/RENAL PELVIC BIOPSY;  Surgeon: [name], MD;  Location: [facility] ORS;  Service: Urology;  Laterality: Left;  . CYSTOSCOPY WITH STENT PLACEMENT Left 12/10/2017   Procedure: CYSTOSCOPY WITH STENT PLACEMENT;  Surgeon: [name], MD;   Location: [facility] ORS;  Service: Urology;  Laterality: Left;  . CYSTOSCOPY WITH STENT PLACEMENT Left 12/09/2018   Procedure: CYSTOSCOPY WITH STENT PLACEMENT;  Surgeon: [name], MD;  Location: [facility] ORS;  Service: Urology;  Laterality: Left;  . CYSTOSCOPY WITH URETEROSCOPY Left 12/09/2018   Procedure: CYSTOSCOPY WITH URETEROSCOPY;  Surgeon: [name], MD;  Location: [facility] ORS;  Service: Urology;  Laterality: Left;  . EYE SURGERY Bilateral    cataract  . INGUINAL HERNIA REPAIR Right 08/09/2015   Procedure: HERNIA REPAIR INGUINAL ADULT;  Surgeon: [name], MD;  Location: [facility] ORS;  Service: General;  Laterality: Right;  . NASAL SINUS SURGERY    . nuclear stress test    . PORTA CATH INSERTION N/A 12/19/2017   Procedure: PORTA CATH INSERTION;  Surgeon: [name], MD;  Location: [place] [facility] CV LAB;  Service: Cardiovascular;  Laterality: N/A;  . TOOTH EXTRACTION  05/28/2020   upper left and it was molars  . URETERAL BIOPSY Left 12/10/2017   Procedure: URETERAL & renal PELVIS BIOPSY;  Surgeon: [name], MD;  Location: [facility] ORS;  Service: Urology;  Laterality: Left;  . URETEROSCOPY Left 12/10/2017   Procedure: URETEROSCOPY;  Surgeon: [name], MD;  Location: [facility] ORS;  Service: Urology;  Laterality: Left;    Social History   Socioeconomic History  . Marital status: Married    Spouse name: [name]  . Number of children: 1  . Years of education: Not on file  . Highest education level: Associate degree: occupational, Hotel manager, or vocational  program  Occupational History  . Occupation: retired  Tobacco Use  . Smoking status: Former Smoker    Types: Cigars    Quit date: 10/09/1978    Years since quitting: 41.8  . Smokeless tobacco: Former Systems developer    Types: Chew    Quit date: 12/04/1988  . Tobacco comment: on occasion  Vaping Use  . Vaping Use: Never used  Substance and Sexual Activity  . Alcohol use: Not Currently  . Drug use: No  . Sexual activity: Not Currently   Other Topics Concern  . Not on file  Social History Narrative  . Not on file   Social Determinants of Health   Financial Resource Strain: Low Risk   . Difficulty of Paying Living Expenses: Not hard at all  Food Insecurity: No Food Insecurity  . Worried About Charity fundraiser in the Last Year: Never true  . Ran Out of Food in the Last Year: Never true  Transportation Needs: No Transportation Needs  . Lack of Transportation (Medical): No  . Lack of Transportation (Non-Medical): No  Physical Activity: Insufficiently Active  . Days of Exercise per Week: 3 days  . Minutes of Exercise per Session: 30 min  Stress: No Stress Concern Present  . Feeling of Stress : Not at all  Social Connections: Moderately Integrated  . Frequency of Communication with Friends and Family: Three times a week  . Frequency of Social Gatherings with Friends and Family: More than three times a week  . Attends Religious Services: More than 4 times per year  . Active Member of Clubs or Organizations: No  . Attends Archivist Meetings: Never  . Marital Status: Married  Human resources officer Violence: Not At Risk  . Fear of Current or Ex-Partner: No  . Emotionally Abused: No  . Physically Abused: No  . Sexually Abused: No    Family History  Problem Relation Age of Onset  . Heart disease Mother   . Cancer Father        Lung and colon cancer  . Heart disease Father   . Emphysema Maternal Grandfather   . Tuberculosis Maternal Grandmother      Current Outpatient Medications:  .  aspirin EC 81 MG tablet, Take 81 mg by mouth every evening. , Disp: , Rfl:  .  atorvastatin (LIPITOR) 40 MG tablet, TAKE 1 TABLET BY MOUTH AT BEDTIME, Disp: 90 tablet, Rfl: 0 .  Cholecalciferol (VITAMIN D3) 25 MCG (1000 UT) CAPS, Take 1 capsule by mouth daily., Disp: , Rfl:  .  clobetasol cream (TEMOVATE) [id number] %, Apply 1 application topically as needed (for skin rash)., Disp: 30 g, Rfl: 1 .  empagliflozin (JARDIANCE) 10  MG TABS tablet, Take 1 tablet (10 mg total) by mouth daily., Disp: 90 tablet, Rfl: 2 .  fluticasone (FLONASE) 50 MCG/ACT nasal spray, Use 2 spray(s) in each nostril once daily (Patient not taking: Reported on 07/01/2020), Disp: 48 g, Rfl: 3 .  glucose blood (CONTOUR NEXT TEST) test strip, 1 each by Other route daily. USE 1 STRIP TO CHECK GLUCOSE ONCE DAILY, Disp: 100 each, Rfl: 3 .  hydrOXYzine (ATARAX/VISTARIL) 25 MG tablet, Take 1 tablet (25 mg total) by mouth every 8 (eight) hours as needed for itching., Disp: 30 tablet, Rfl: 3 .  Ivermectin 1 % CREA, Apply 1 application topically daily as needed (rosacea). To face, Disp: , Rfl:  .  lidocaine-prilocaine (EMLA) cream, Apply 1 application topically as needed (port access)., Disp: 1 g, Rfl:  3 .  losartan (COZAAR) 50 MG tablet, Take 1 tablet by mouth once daily, Disp: 90 tablet, Rfl: 0 .  magic mouthwash w/lidocaine SOLN, Take 5 mLs by mouth 4 (four) times daily as needed for mouth pain. (Patient not taking: No sig reported), Disp: 480 mL, Rfl: 3 .  magnesium hydroxide (MILK OF MAGNESIA) 400 MG/5ML suspension, Take 15 mLs by mouth daily as needed for mild constipation., Disp: , Rfl:  .  metFORMIN (GLUCOPHAGE) 1000 MG tablet, TAKE 1 TABLET BY MOUTH TWICE DAILY WITH MEALS, Disp: 180 tablet, Rfl: 0 .  nystatin cream (MYCOSTATIN), Apply topically 2 (two) times daily. (Patient taking differently: Apply topically 2 (two) times daily as needed.), Disp: 15 g, Rfl: 1 .  OVER THE COUNTER MEDICATION, Apply 1 application topically daily as needed (pain). Outback topical pain relief , Disp: , Rfl:  .  pregabalin (LYRICA) 75 MG capsule, Take 1 capsule by mouth once daily, Disp: 90 capsule, Rfl: 1 .  psyllium (METAMUCIL) 58.6 % powder, Take 1 packet by mouth daily., Disp: , Rfl:  .  sertraline (ZOLOFT) 50 MG tablet, Take 50 mg by mouth daily., Disp: , Rfl:  .  vitamin B-12 (CYANOCOBALAMIN) 1000 MCG tablet, Take by mouth daily. Unsure dose, Disp: , Rfl:  .  vitamin C  (ASCORBIC ACID) 500 MG tablet, Take 1,000-1,500 mg by mouth 2 (two) times daily., Disp: , Rfl:  No current facility-administered medications for this visit.  Facility-Administered Medications Ordered in Other Visits:  .  Influenza vac split quadrivalent PF (FLUZONE HIGH-DOSE) injection 0.5 mL, 0.5 mL, Intramuscular, Once, Honor Loh E, NP .  sodium chloride flush (NS) 0.9 % injection 10 mL, 10 mL, Intravenous, PRN, Sindy Guadeloupe, MD, 10 mL at 08/02/20 0914  Physical exam:  Vitals:   08/02/20 0946  BP: 99/75  Pulse: 95  Resp: 17  Temp: (!) 97.1 F (36.2 C)  TempSrc: Tympanic  SpO2: 95%  Weight: 160 lb (72.6 kg)   Physical Exam Constitutional:      General: He is not in acute distress. Eyes:     Extraocular Movements: EOM normal.  Cardiovascular:     Rate and Rhythm: Normal rate and regular rhythm.     Heart sounds: Normal heart sounds.  Pulmonary:     Effort: Pulmonary effort is normal.  Skin:    General: Skin is warm and dry.  Neurological:     Mental Status: He is alert and oriented to person, place, and time.      CMP Latest Ref Rng & Units 08/02/2020  Glucose 70 - 99 mg/dL 145(H)  BUN 8 - 23 mg/dL 22  Creatinine 0.61 - 1.24 mg/dL 1.05  Sodium 135 - 145 mmol/L 136  Potassium 3.5 - 5.1 mmol/L 3.9  Chloride 98 - 111 mmol/L 100  CO2 22 - 32 mmol/L 25  Calcium 8.9 - 10.3 mg/dL 9.7  Total Protein 6.5 - 8.1 g/dL 7.4  Total Bilirubin 0.3 - 1.2 mg/dL 1.1  Alkaline Phos 38 - 126 U/L 95  AST 15 - 41 U/L 18  ALT 0 - 44 U/L 13   CBC Latest Ref Rng & Units 08/02/2020  WBC 4.0 - 10.5 K/uL 7.3  Hemoglobin 13.0 - 17.0 g/dL 13.0  Hematocrit 39.0 - 52.0 % 38.2(L)  Platelets 150 - 400 K/uL 193    No images are attached to the encounter.  CT CHEST ABDOMEN PELVIS W CONTRAST  Result Date: 07/30/2020 CLINICAL DATA:  Follow-up metastatic urothelial carcinoma. Currently undergoing chemotherapy.  EXAM: CT CHEST, ABDOMEN, AND PELVIS WITH CONTRAST TECHNIQUE: Multidetector CT  imaging of the chest, abdomen and pelvis was performed following the standard protocol during bolus administration of intravenous contrast. CONTRAST:  50mL OMNIPAQUE IOHEXOL 300 MG/ML  SOLN COMPARISON:  01/21/2020 FINDINGS: CT CHEST FINDINGS Cardiovascular: No acute findings. Aortic and coronary atherosclerotic calcification noted. Mediastinum/Lymph Nodes: No masses or pathologically enlarged lymph nodes identified. Lungs/Pleura: No suspicious pulmonary nodules or masses identified. Chronic interstitial lung disease remains stable in appearance. No evidence of acute infiltrate or pleural effusion. Musculoskeletal:  No suspicious bone lesions identified. CT ABDOMEN AND PELVIS FINDINGS Hepatobiliary: No masses identified. A tiny less than 5 mm gallstones again seen, however is no evidence cholecystitis or biliary ductal dilatation. Pancreas: Diffuse fatty replacement again noted. No mass or inflammatory changes. Spleen:  Within normal limits in size and appearance. Adrenals/Urinary tract: Normal adrenal glands. Bilateral renal cysts are again noted. Soft tissue density is again seen in the left lower pole renal collecting system and renal pelvis left hydronephrosis and parenchymal atrophy, which measures 2.3 x 1.9 cm on image 85/4, mildly increased in size from 2.1 x 1.5 cm previously. Mild left hydronephrosis injury increased since previous study. Mild diffuse left renal parenchymal atrophy is stable. Stomach/Bowel: No evidence of obstruction, inflammatory process, or abnormal fluid collections. Large amount of colonic stool again noted. Vascular/Lymphatic: No pathologically enlarged lymph nodes identified. No abdominal aortic aneurysm. Aortic atherosclerotic calcification noted. Reproductive:  No mass or other significant abnormality identified. Other:  None. Musculoskeletal:  No suspicious bone lesions identified. IMPRESSION: Mild increase in size of soft tissue density in left lower pole renal collecting system  and renal pelvis, with increased mild left hydronephrosis. This is consistent with recurrent urothelial carcinoma. No evidence of metastatic disease within the chest, abdomen, or pelvis. Cholelithiasis. No radiographic evidence of cholecystitis. Aortic and coronary atherosclerotic calcification noted. Electronically Signed   By: Marlaine Hind M.D.   On: 07/30/2020 12:26     Assessment and plan- Patient is a 75 y.o. 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 He is here for on treatment assessment prior to next cycle of Padcev and discuss CT scan results  I have reviewed CT chest abdomen pelvis images independently and discussed findings with the patient and his wife.His primary upper urothelial mass is mildly increased in size from 2.1 x 1.5 cm to 2.3 x 1.9 cm.  There is mild increase in left hydronephrosis as well.  No other evidence of progressive or metastatic disease.  Patient has been only receiving Padcev 1 week on 3 weeks off instead of 3-week on 1 week off which is typical schedule.  He does have baseline neuropathy and wishes to maintain his quality of life and prefers the present schedule.  Discussed with patient and his wife that it would be okay to continue the present schedule as long as we are not seeing significant increase in disease burden.  Although there is mild increase in his hydronephrosis and renal functions remained stable.  I will see him back in 4 weeks time for next cycle of Padcev.  Plan to repeat CT scans in 4 months   Visit Diagnosis 1. Encounter for antineoplastic chemotherapy   2. Urothelial carcinoma of kidney, left (HCC)      Dr. Randa Evens, MD, MPH [facility] [phone] 08/02/2020 1:04 PM

## 2020-08-04 ENCOUNTER — Other Ambulatory Visit: Payer: Self-pay

## 2020-08-04 ENCOUNTER — Ambulatory Visit: Payer: PRIVATE HEALTH INSURANCE

## 2020-08-16 ENCOUNTER — Telehealth: Payer: Self-pay | Admitting: Family Medicine

## 2020-08-16 DIAGNOSIS — E119 Type 2 diabetes mellitus without complications: Secondary | ICD-10-CM

## 2020-08-16 MED ORDER — BLOOD GLUCOSE METER KIT: PACK | 0 refills | Status: AC

## 2020-08-16 NOTE — Telephone Encounter (Signed)
Melissa calling from [facility], [place]. Needing script for One touch machine, test strips, lancets. Patient was previously on the contour next. Patient's insurance has changed.  Medicare has new requirement - can not state as needed, as directed, or test up to. [phone]

## 2020-08-16 NOTE — Telephone Encounter (Signed)
Rx faxed to [facility].

## 2020-08-18 ENCOUNTER — Other Ambulatory Visit: Payer: Self-pay

## 2020-08-18 ENCOUNTER — Other Ambulatory Visit: Payer: Self-pay | Admitting: *Deleted

## 2020-08-18 ENCOUNTER — Ambulatory Visit (INDEPENDENT_AMBULATORY_CARE_PROVIDER_SITE_OTHER): Payer: HMO

## 2020-08-18 DIAGNOSIS — E119 Type 2 diabetes mellitus without complications: Secondary | ICD-10-CM

## 2020-08-18 DIAGNOSIS — Z23 Encounter for immunization: Secondary | ICD-10-CM

## 2020-08-18 MED ORDER — ONETOUCH ULTRA VI STRP: ORAL_STRIP | 12 refills | Status: AC

## 2020-08-18 MED ORDER — ONETOUCH ULTRA VI STRP: ORAL_STRIP | 12 refills | Status: DC

## 2020-08-18 MED ORDER — ONETOUCH ULTRASOFT LANCETS MISC: 12 refills | Status: AC

## 2020-08-18 MED ORDER — ONETOUCH ULTRA 2 W/DEVICE KIT: 1.0000 | PACK | Freq: Once | 0 refills | Status: DC

## 2020-08-18 MED ORDER — ONETOUCH ULTRA 2 W/DEVICE KIT: 1.0000 | PACK | Freq: Once | 0 refills | Status: AC

## 2020-08-18 MED ORDER — ONETOUCH ULTRASOFT LANCETS MISC: 12 refills | Status: DC

## 2020-08-23 ENCOUNTER — Telehealth: Payer: Self-pay

## 2020-08-23 NOTE — Telephone Encounter (Signed)
Please review. Thanks!  

## 2020-08-23 NOTE — Telephone Encounter (Signed)
Patient dropped a form off from [employer] that needs to be completed ASAP. They are threatening to cancel his insurance coverage.  Form must be completed and faxed back by 09/07/20.   The fax # is on the form.    Please let patient know when this has been faxed back to [facility] so he can quit worrying over this.  Thanks

## 2020-08-24 ENCOUNTER — Other Ambulatory Visit: Payer: Self-pay | Admitting: Family Medicine

## 2020-08-30 ENCOUNTER — Inpatient Hospital Stay: Payer: HMO

## 2020-08-30 ENCOUNTER — Inpatient Hospital Stay: Payer: HMO | Admitting: Hospice and Palliative Medicine

## 2020-08-30 ENCOUNTER — Inpatient Hospital Stay: Payer: HMO | Attending: Oncology | Admitting: Oncology

## 2020-08-30 ENCOUNTER — Other Ambulatory Visit: Payer: Self-pay

## 2020-08-30 VITALS — BP 107/63 | HR 94 | Temp 97.6°F | Resp 18 | Wt 161.9 lb

## 2020-08-30 DIAGNOSIS — G620 Drug-induced polyneuropathy: Secondary | ICD-10-CM | POA: Insufficient documentation

## 2020-08-30 DIAGNOSIS — C642 Malignant neoplasm of left kidney, except renal pelvis: Secondary | ICD-10-CM

## 2020-08-30 DIAGNOSIS — Z87891 Personal history of nicotine dependence: Secondary | ICD-10-CM | POA: Diagnosis not present

## 2020-08-30 DIAGNOSIS — Z5111 Encounter for antineoplastic chemotherapy: Secondary | ICD-10-CM | POA: Diagnosis not present

## 2020-08-30 DIAGNOSIS — R17 Unspecified jaundice: Secondary | ICD-10-CM | POA: Diagnosis not present

## 2020-08-30 LAB — CBC WITH DIFFERENTIAL/PLATELET
Abs Immature Granulocytes: 0.02 10*3/uL (ref 0.00–0.07)
Basophils Absolute: 0.1 10*3/uL (ref 0.0–0.1)
Basophils Relative: 1 %
Eosinophils Absolute: 0.2 10*3/uL (ref 0.0–0.5)
Eosinophils Relative: 3 %
HCT: 37.7 % — ABNORMAL LOW (ref 39.0–52.0)
Hemoglobin: 12.4 g/dL — ABNORMAL LOW (ref 13.0–17.0)
Immature Granulocytes: 0 %
Lymphocytes Relative: 18 %
Lymphs Abs: 1.5 10*3/uL (ref 0.7–4.0)
MCH: 28.9 pg (ref 26.0–34.0)
MCHC: 32.9 g/dL (ref 30.0–36.0)
MCV: 87.9 fL (ref 80.0–100.0)
Monocytes Absolute: 0.9 10*3/uL (ref 0.1–1.0)
Monocytes Relative: 10 %
Neutro Abs: 5.7 10*3/uL (ref 1.7–7.7)
Neutrophils Relative %: 68 %
Platelets: 203 10*3/uL (ref 150–400)
RBC: 4.29 MIL/uL (ref 4.22–5.81)
RDW: 14.6 % (ref 11.5–15.5)
WBC: 8.4 10*3/uL (ref 4.0–10.5)
nRBC: 0 % (ref 0.0–0.2)

## 2020-08-30 LAB — COMPREHENSIVE METABOLIC PANEL
ALT: 12 U/L (ref 0–44)
AST: 17 U/L (ref 15–41)
Albumin: 3.9 g/dL (ref 3.5–5.0)
Alkaline Phosphatase: 97 U/L (ref 38–126)
Anion gap: 14 (ref 5–15)
BUN: 24 mg/dL — ABNORMAL HIGH (ref 8–23)
CO2: 24 mmol/L (ref 22–32)
Calcium: 9.8 mg/dL (ref 8.9–10.3)
Chloride: 98 mmol/L (ref 98–111)
Creatinine, Ser: 1.06 mg/dL (ref 0.61–1.24)
GFR, Estimated: >60 mL/min (ref >60)
Glucose, Bld: 191 mg/dL — ABNORMAL HIGH (ref 70–99)
Potassium: 4 mmol/L (ref 3.5–5.1)
Sodium: 136 mmol/L (ref 135–145)
Total Bilirubin: 1.8 mg/dL — ABNORMAL HIGH (ref 0.3–1.2)
Total Protein: 7.8 g/dL (ref 6.5–8.1)

## 2020-08-30 MED ORDER — SODIUM CHLORIDE 0.9% FLUSH
10.0000 mL | INTRAVENOUS | Status: DC | PRN
  Administered 2020-08-30: 10 mL via INTRAVENOUS
  Filled 2020-08-30: qty 10

## 2020-08-30 MED ORDER — HEPARIN SOD (PORK) LOCK FLUSH 100 UNIT/ML IV SOLN
500.0000 [IU] | Freq: Once | INTRAVENOUS | Status: AC
  Administered 2020-08-30: 500 [IU] via INTRAVENOUS
  Filled 2020-08-30: qty 5

## 2020-08-30 NOTE — Progress Notes (Signed)
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 [name] so far for his metastatic urothelial carcinoma. This was originally diagnosed in May 2019. He was noted to have a filling defect in the lower pole collecting system of the left kidney along with para-aortic and retroperitoneal adenopathy concerning for metastatic disease. He underwent left ureteroscopy and renal pelvis biopsy which revealed small fragments of high-grade urothelial carcinoma with small focus of invasion. He was started on carboplatin and gemcitabine chemotherapy in June 2019 and was continued on 05/27/2018 for 8 cycles. He tolerated chemotherapy well except for chemo-induced anemia for which she has been getting Procrit every 2 weeks. He did have response to his disease based on scans in August 2019. However repeat scan on 05/31/2018 showed increase in the size of the primary tumor from 1.2 to 1.9 cm and increase in left para-aortic adenopathy from 0.9 to 1.3 cm. Periportal adenopathy was stable at 1.4 cm. Second line immunotherapy was recommended. His initial biopsy specimen did not have enough sample to undergo FGFR mutation testing. He also has mild interstitial lung disease for which he sees  pulmonary but he is not on home oxygen. He has B12 deficiency for which he is on oral B12. Also has diabetes and coronary artery disease.Tecentriq started on 06/17/2018.Patient noted to have disease progression in his lymph nodes in May 2020. Repeat biopsy showed metastatic urothelial carcinoma which did not have aFGFR mutation. He has been started on third line Padcev  Treatment on hold since April 2021 due to neuropathy and to maintain quality of lifebut restarted in August 2021after he was found to have progression of diseasewhich he has 1 week on 3 weeks off  Interval history-patient received his Covid booster last week.  Reports feeling more fatigued than usual.  Also feels like he needs to get back with physical therapy to improve his right foot strength.  Neuropathy has been stable.  ECOG PS- 1 Pain scale- 0 Opioid associated constipation- no  Review of systems- Review of Systems  Constitutional: Positive for malaise/fatigue. Negative for chills, fever and weight loss.  HENT: Negative for congestion, ear discharge and nosebleeds.   Eyes: Negative for blurred vision.  Respiratory: Negative for cough, hemoptysis, sputum production, shortness of breath and wheezing.   Cardiovascular: Negative for chest pain, palpitations, orthopnea and claudication.  Gastrointestinal: Negative for abdominal pain, blood in stool, constipation, diarrhea, heartburn, melena, nausea and vomiting.  Genitourinary: Negative for dysuria, flank pain, frequency, hematuria and urgency.  Musculoskeletal: Negative for back pain, joint pain and myalgias.  Skin: Negative for rash.  Neurological: Positive for sensory change (Peripheral neuropathy). Negative for dizziness, tingling, focal weakness, seizures, weakness and headaches.  Endo/Heme/Allergies: Does not bruise/bleed easily.  Psychiatric/Behavioral: Negative for depression and suicidal ideas. The patient does not have insomnia.       Allergies   Allergen Reactions  . Sulfa  Antibiotics Other (See Comments)    Joint pain  . Ace Inhibitors Cough  . Invokana [Canagliflozin] Other (See Comments)    Leg pain  . Penicillins Rash    Did it involve swelling of the face/tongue/throat, SOB, or low BP? No Did it involve sudden or severe rash/hives, skin peeling, or any reaction on the inside of your mouth or nose? Yes Did you need to seek medical attention at a hospital or doctor's office? Yes When did it last happen?childhood allergy If all above answers are "NO", may proceed with cephalosporin use.      Past Medical History:  Diagnosis Date  . Acid reflux   . Anxiety   . Depression   . Diabetes mellitus without complication (HCC)   . Hyperlipidemia   . Hypertension   . MRSA (methicillin resistant Staphylococcus aureus) infection 1992   history  . Myocardial infarction (HCC) 1995  . Sleep apnea    CPAP  . Urothelial cancer (HCC) 11/2017   Left Urothelial mass, chemo tx's     Past Surgical History:  Procedure Laterality Date  . CATARACT EXTRACTION Left   . COLONOSCOPY  2010   [facility]  . COLONOSCOPY WITH PROPOFOL N/A 10/04/2016   Procedure: COLONOSCOPY WITH PROPOFOL;  Surgeon: Scot Jun, MD;  Location: [facility] ENDOSCOPY;  Service: Endoscopy;  Laterality: N/A;  . CORONARY ANGIOPLASTY WITH STENT PLACEMENT  1995, 1998, 2000, 2001, 2014  . CYSTOSCOPY W/ RETROGRADES Bilateral 12/10/2017   Procedure: CYSTOSCOPY WITH RETROGRADE PYELOGRAM;  Surgeon: Vanna Scotland, MD;  Location: [facility] ORS;  Service: Urology;  Laterality: Bilateral;  . CYSTOSCOPY W/ RETROGRADES Left 12/09/2018   Procedure: CYSTOSCOPY WITH RETROGRADE PYELOGRAM;  Surgeon: Vanna Scotland, MD;  Location: [facility] ORS;  Service: Urology;  Laterality: Left;  . CYSTOSCOPY WITH BIOPSY Left 12/09/2018   Procedure: CYSTOSCOPY WITH URETERAL/RENAL PELVIC BIOPSY;  Surgeon: Vanna Scotland, MD;  Location: [facility] ORS;  Service: Urology;  Laterality: Left;  . CYSTOSCOPY WITH STENT  PLACEMENT Left 12/10/2017   Procedure: CYSTOSCOPY WITH STENT PLACEMENT;  Surgeon: Vanna Scotland, MD;  Location: [facility] ORS;  Service: Urology;  Laterality: Left;  . CYSTOSCOPY WITH STENT PLACEMENT Left 12/09/2018   Procedure: CYSTOSCOPY WITH STENT PLACEMENT;  Surgeon: Vanna Scotland, MD;  Location: [facility] ORS;  Service: Urology;  Laterality: Left;  . CYSTOSCOPY WITH URETEROSCOPY Left 12/09/2018   Procedure: CYSTOSCOPY WITH URETEROSCOPY;  Surgeon: Vanna Scotland, MD;  Location: [facility] ORS;  Service: Urology;  Laterality: Left;  . EYE SURGERY Bilateral    cataract  . INGUINAL HERNIA REPAIR Right 08/09/2015   Procedure: HERNIA REPAIR INGUINAL ADULT;  Surgeon: Earline Mayotte, MD;  Location: [facility] ORS;  Service: General;  Laterality: Right;  . NASAL SINUS SURGERY    . nuclear stress test    . PORTA CATH INSERTION N/A 12/19/2017   Procedure: PORTA CATH INSERTION;  Surgeon: Annice Needy, MD;  Location: [facility] INVASIVE CV LAB;  Service: Cardiovascular;  Laterality: N/A;  . TOOTH EXTRACTION  05/28/2020   upper left and it was molars  . URETERAL BIOPSY Left 12/10/2017   Procedure: URETERAL & renal PELVIS BIOPSY;  Surgeon: Vanna Scotland, MD;  Location: [facility] ORS;  Service: Urology;  Laterality: Left;  . URETEROSCOPY Left 12/10/2017   Procedure: URETEROSCOPY;  Surgeon: Vanna Scotland, MD;  Location: [facility] ORS;  Service: Urology;  Laterality: Left;    Social History   Socioeconomic History  . Marital status: Married    Spouse name: [name]  . Number of children: 1  . Years  of education: Not on file  . Highest education level: Associate degree: occupational, Hotel manager, or vocational program  Occupational History  . Occupation: retired  Tobacco Use  . Smoking status: Former Smoker    Types: Cigars    Quit date: 10/09/1978    Years since quitting: 41.9  . Smokeless tobacco: Former Systems developer    Types: Chew    Quit date: 12/04/1988  . Tobacco comment: on occasion  Vaping Use  . Vaping Use: Never used  Substance  and Sexual Activity  . Alcohol use: Not Currently  . Drug use: No  . Sexual activity: Not Currently  Other Topics Concern  . Not on file  Social History Narrative  . Not on file   Social Determinants of Health   Financial Resource Strain: Low Risk   . Difficulty of Paying Living Expenses: Not hard at all  Food Insecurity: No Food Insecurity  . Worried About Charity fundraiser in the Last Year: Never true  . Ran Out of Food in the Last Year: Never true  Transportation Needs: No Transportation Needs  . Lack of Transportation (Medical): No  . Lack of Transportation (Non-Medical): No  Physical Activity: Insufficiently Active  . Days of Exercise per Week: 3 days  . Minutes of Exercise per Session: 30 min  Stress: No Stress Concern Present  . Feeling of Stress : Not at all  Social Connections: Moderately Integrated  . Frequency of Communication with Friends and Family: Three times a week  . Frequency of Social Gatherings with Friends and Family: More than three times a week  . Attends Religious Services: More than 4 times per year  . Active Member of Clubs or Organizations: No  . Attends Archivist Meetings: Never  . Marital Status: Married  Human resources officer Violence: Not At Risk  . Fear of Current or Ex-Partner: No  . Emotionally Abused: No  . Physically Abused: No  . Sexually Abused: No    Family History  Problem Relation Age of Onset  . Heart disease Mother   . Cancer Father        Lung and colon cancer  . Heart disease Father   . Emphysema Maternal Grandfather   . Tuberculosis Maternal Grandmother      Current Outpatient Medications:  .  aspirin EC 81 MG tablet, Take 81 mg by mouth every evening. , Disp: , Rfl:  .  atorvastatin (LIPITOR) 40 MG tablet, TAKE 1 TABLET BY MOUTH AT BEDTIME, Disp: 90 tablet, Rfl: 0 .  blood glucose meter kit and supplies, Dispense based on patient and insurance preference. Use once daily as directed. (FOR ICD-10 E11.9)., Disp: 1  each, Rfl: 0 .  Cholecalciferol (VITAMIN D3) 25 MCG (1000 UT) CAPS, Take 1 capsule by mouth daily., Disp: , Rfl:  .  clobetasol cream (TEMOVATE) 5.46 %, Apply 1 application topically as needed (for skin rash)., Disp: 30 g, Rfl: 1 .  glucose blood (ONETOUCH ULTRA) test strip, Use as instructed, Disp: 100 each, Rfl: 12 .  hydrOXYzine (ATARAX/VISTARIL) 25 MG tablet, Take 1 tablet (25 mg total) by mouth every 8 (eight) hours as needed for itching., Disp: 30 tablet, Rfl: 3 .  Ivermectin 1 % CREA, Apply 1 application topically daily as needed (rosacea). To face, Disp: , Rfl:  .  Lancets (ONETOUCH ULTRASOFT) lancets, Use as instructed, Disp: 100 each, Rfl: 12 .  lidocaine-prilocaine (EMLA) cream, Apply 1 application topically as needed (port access)., Disp: 1 g, Rfl: 3 .  losartan (COZAAR) 50 MG tablet, Take 1 tablet by mouth once daily, Disp: 90 tablet, Rfl: 0 .  magnesium hydroxide (MILK OF MAGNESIA) 400 MG/5ML suspension, Take 15 mLs by mouth daily as needed for mild constipation., Disp: , Rfl:  .  metFORMIN (GLUCOPHAGE) 1000 MG tablet, TAKE 1 TABLET BY MOUTH TWICE DAILY WITH MEALS, Disp: 180 tablet, Rfl: 0 .  OVER THE COUNTER MEDICATION, Apply 1 application topically daily as needed (pain). Outback topical pain relief , Disp: , Rfl:  .  pregabalin (LYRICA) 75 MG capsule, Take 1 capsule by mouth once daily, Disp: 90 capsule, Rfl: 1 .  psyllium (METAMUCIL) 58.6 % powder, Take 1 packet by mouth daily., Disp: , Rfl:  .  sertraline (ZOLOFT) 50 MG tablet, Take 50 mg by mouth daily., Disp: , Rfl:  .  vitamin B-12 (CYANOCOBALAMIN) 1000 MCG tablet, Take by mouth daily. Unsure dose, Disp: , Rfl:  .  vitamin C (ASCORBIC ACID) 500 MG tablet, Take 1,000-1,500 mg by mouth 2 (two) times daily., Disp: , Rfl:  .  Blood Glucose Monitoring Suppl (ONE TOUCH ULTRA 2) w/Device KIT, 1 each by Does not apply route once for 1 dose. Test blood sugars once daily, Disp: 1 kit, Rfl: 0 .  empagliflozin (JARDIANCE) 10 MG TABS  tablet, Take 1 tablet (10 mg total) by mouth daily. (Patient not taking: Reported on 08/30/2020), Disp: 90 tablet, Rfl: 2 .  fluticasone (FLONASE) 50 MCG/ACT nasal spray, Use 2 spray(s) in each nostril once daily (Patient not taking: No sig reported), Disp: 48 g, Rfl: 3 .  magic mouthwash w/lidocaine SOLN, Take 5 mLs by mouth 4 (four) times daily as needed for mouth pain. (Patient not taking: No sig reported), Disp: 480 mL, Rfl: 3 .  nystatin cream (MYCOSTATIN), Apply topically 2 (two) times daily. (Patient not taking: Reported on 08/30/2020), Disp: 15 g, Rfl: 1 No current facility-administered medications for this visit.  Facility-Administered Medications Ordered in Other Visits:  .  Influenza vac split quadrivalent PF (FLUZONE HIGH-DOSE) injection 0.5 mL, 0.5 mL, Intramuscular, Once, Honor Loh E, NP .  sodium chloride flush (NS) 0.9 % injection 10 mL, 10 mL, Intravenous, PRN, Sindy Guadeloupe, MD, 10 mL at 08/30/20 9030  Physical exam:  Vitals:   08/30/20 0933  BP: 107/63  Pulse: 94  Resp: 18  Temp: 97.6 F (36.4 C)  TempSrc: Tympanic  SpO2: 97%  Weight: 161 lb 14.4 oz (73.4 kg)   Physical Exam Constitutional:      General: He is not in acute distress. Eyes:     Extraocular Movements: EOM normal.  Cardiovascular:     Rate and Rhythm: Normal rate and regular rhythm.     Heart sounds: Normal heart sounds.  Pulmonary:     Effort: Pulmonary effort is normal.     Breath sounds: Normal breath sounds.  Abdominal:     General: Bowel sounds are normal.     Palpations: Abdomen is soft.  Musculoskeletal:     Comments: Right foot strength is 4/5  Skin:    General: Skin is warm and dry.  Neurological:     Mental Status: He is alert and oriented to person, place, and time.      CMP Latest Ref Rng & Units 08/30/2020  Glucose 70 - 99 mg/dL 191(H)  BUN 8 - 23 mg/dL 24(H)  Creatinine 0.61 - 1.24 mg/dL 1.06  Sodium 135 - 145 mmol/L 136  Potassium 3.5 - 5.1 mmol/L 4.0  Chloride 98 - 111  mmol/L 98  CO2 22 - 32 mmol/L 24  Calcium 8.9 - 10.3 mg/dL 9.8  Total Protein 6.5 - 8.1 g/dL 7.8  Total Bilirubin 0.3 - 1.2 mg/dL 1.8(H)  Alkaline Phos 38 - 126 U/L 97  AST 15 - 41 U/L 17  ALT 0 - 44 U/L 12   CBC Latest Ref Rng & Units 08/30/2020  WBC 4.0 - 10.5 K/uL 8.4  Hemoglobin 13.0 - 17.0 g/dL 12.4(L)  Hematocrit 39.0 - 52.0 % 37.7(L)  Platelets 150 - 400 K/uL 203     Assessment and plan- Patient is a 75 y.o. 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 He is here for on treatment assessment prior to next cycle of Padcev  Bilirubin today is elevated at 1.8.  The cutoff for giving at 7 is usually a bilirubin of 1.5.  Patient has had a bilirubin of 1.3 and 1.4 in the past which was self-limited and hers bilirubin eventually trended down.  I will hold off on giving him Padcev today.  The etiology of elevated bilirubin is unclear.  Padcev can cause abnormalities with AST ALT but not typically hyperbilirubinemia.  Unclear if related to Covid booster vaccine given a week ago.   He will be seen by covering NP in 2 weeks with repeat CBC with differential and CMP.  If bilirubin is less than 1.5 he can proceed with Padcev and I will see him back in 6 weeks for the next dose.  Patient is currently on a 1 week on 3-week off regimen instead of 3 weeks on 1 week off for better tolerance and low-volume disease.  Bilirubin is higher at his next visit he would need further evaluation including GGT and right upper quadrant ultrasound.  Patient verbalized understanding   Visit Diagnosis 1. Urothelial carcinoma of kidney, left ([place])   2. Encounter for antineoplastic chemotherapy   3. Elevated bilirubin      Dr. Randa Evens, MD, MPH [facility] [phone] 08/30/2020 11:59 AM

## 2020-08-31 NOTE — Telephone Encounter (Signed)
I do not see any forms pending.

## 2020-09-01 NOTE — Telephone Encounter (Signed)
This was done already. However, I do not see it scanned in pt chart. Nichole, do you have anything on this patient?

## 2020-09-07 NOTE — Telephone Encounter (Signed)
Form is now scanned in chart. This was faxed on 08/23/2020.

## 2020-09-13 ENCOUNTER — Inpatient Hospital Stay (HOSPITAL_BASED_OUTPATIENT_CLINIC_OR_DEPARTMENT_OTHER): Payer: HMO | Admitting: Nurse Practitioner

## 2020-09-13 ENCOUNTER — Inpatient Hospital Stay: Payer: HMO | Attending: Nurse Practitioner

## 2020-09-13 ENCOUNTER — Inpatient Hospital Stay: Payer: HMO

## 2020-09-13 ENCOUNTER — Other Ambulatory Visit: Payer: Self-pay

## 2020-09-13 VITALS — BP 107/73 | HR 79 | Temp 96.9°F | Resp 17 | Wt 159.1 lb

## 2020-09-13 DIAGNOSIS — C642 Malignant neoplasm of left kidney, except renal pelvis: Secondary | ICD-10-CM

## 2020-09-13 DIAGNOSIS — Z5111 Encounter for antineoplastic chemotherapy: Secondary | ICD-10-CM | POA: Diagnosis not present

## 2020-09-13 DIAGNOSIS — Z5112 Encounter for antineoplastic immunotherapy: Secondary | ICD-10-CM | POA: Diagnosis present

## 2020-09-13 DIAGNOSIS — T451X5A Adverse effect of antineoplastic and immunosuppressive drugs, initial encounter: Secondary | ICD-10-CM

## 2020-09-13 DIAGNOSIS — D701 Agranulocytosis secondary to cancer chemotherapy: Secondary | ICD-10-CM

## 2020-09-13 LAB — COMPREHENSIVE METABOLIC PANEL
ALT: 12 U/L (ref 0–44)
AST: 18 U/L (ref 15–41)
Albumin: 3.8 g/dL (ref 3.5–5.0)
Alkaline Phosphatase: 97 U/L (ref 38–126)
Anion gap: 13 (ref 5–15)
BUN: 30 mg/dL — ABNORMAL HIGH (ref 8–23)
CO2: 24 mmol/L (ref 22–32)
Calcium: 9.6 mg/dL (ref 8.9–10.3)
Chloride: 96 mmol/L — ABNORMAL LOW (ref 98–111)
Creatinine, Ser: 0.85 mg/dL (ref 0.61–1.24)
GFR, Estimated: >60 mL/min (ref >60)
Glucose, Bld: 113 mg/dL — ABNORMAL HIGH (ref 70–99)
Potassium: 4 mmol/L (ref 3.5–5.1)
Sodium: 133 mmol/L — ABNORMAL LOW (ref 135–145)
Total Bilirubin: 1.2 mg/dL (ref 0.3–1.2)
Total Protein: 7.6 g/dL (ref 6.5–8.1)

## 2020-09-13 LAB — CBC WITH DIFFERENTIAL/PLATELET
Basophils Absolute: 0.1 10*3/uL (ref 0.0–0.1)
Basophils Relative: 1 %
Eosinophils Absolute: 0.3 10*3/uL (ref 0.0–0.5)
Eosinophils Relative: 4 %
HCT: 36.3 % — ABNORMAL LOW (ref 39.0–52.0)
Hemoglobin: 12.3 g/dL — ABNORMAL LOW (ref 13.0–17.0)
Lymphocytes Relative: 17 %
Lymphs Abs: 1.5 10*3/uL (ref 0.7–4.0)
MCH: 29.2 pg (ref 26.0–34.0)
MCHC: 33.9 g/dL (ref 30.0–36.0)
MCV: 86.2 fL (ref 80.0–100.0)
Monocytes Absolute: 0.7 10*3/uL (ref 0.1–1.0)
Monocytes Relative: 8 %
Neutro Abs: 5.9 10*3/uL (ref 1.7–7.7)
Neutrophils Relative %: 70 %
Platelets: 268 10*3/uL (ref 150–400)
RBC: 4.21 MIL/uL — ABNORMAL LOW (ref 4.22–5.81)
RDW: 14.1 % (ref 11.5–15.5)
WBC: 8.4 10*3/uL (ref 4.0–10.5)

## 2020-09-13 MED ORDER — PALONOSETRON HCL INJECTION 0.25 MG/5ML
0.2500 mg | Freq: Once | INTRAVENOUS | Status: AC
  Administered 2020-09-13: 0.25 mg via INTRAVENOUS
  Filled 2020-09-13: qty 5

## 2020-09-13 MED ORDER — SODIUM CHLORIDE 0.9 % IV SOLN
Freq: Once | INTRAVENOUS | Status: AC
  Filled 2020-09-13: qty 250

## 2020-09-13 MED ORDER — SODIUM CHLORIDE 0.9 % IV SOLN
1.0000 mg/kg | Freq: Once | INTRAVENOUS | Status: AC
  Administered 2020-09-13: 70 mg via INTRAVENOUS
  Filled 2020-09-13: qty 3

## 2020-09-13 MED ORDER — SODIUM CHLORIDE 0.9% FLUSH
10.0000 mL | Freq: Once | INTRAVENOUS | Status: AC
  Administered 2020-09-13: 10 mL via INTRAVENOUS
  Filled 2020-09-13: qty 10

## 2020-09-13 MED ORDER — HEPARIN SOD (PORK) LOCK FLUSH 100 UNIT/ML IV SOLN
INTRAVENOUS | Status: AC
  Filled 2020-09-13: qty 5

## 2020-09-13 MED ORDER — HEPARIN SOD (PORK) LOCK FLUSH 100 UNIT/ML IV SOLN
500.0000 [IU] | Freq: Once | INTRAVENOUS | Status: AC
  Administered 2020-09-13: 500 [IU] via INTRAVENOUS
  Filled 2020-09-13: qty 5

## 2020-09-13 NOTE — Progress Notes (Signed)
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 [name] so far for his metastatic urothelial carcinoma. This was originally diagnosed in May 2019. He was noted to have a filling defect in the lower pole collecting system of the left kidney along with para-aortic and retroperitoneal adenopathy concerning for metastatic disease. He underwent left ureteroscopy and renal pelvis biopsy which revealed small fragments of high-grade urothelial carcinoma with small focus of invasion. He was started on carboplatin and gemcitabine chemotherapy in June 2019 and was continued on 05/27/2018 for 8 cycles. He tolerated chemotherapy well except for chemo-induced anemia for which she has been getting Procrit every 2 weeks. He did have response to his disease based on scans in August 2019. However repeat scan on 05/31/2018 showed increase in the size of the primary tumor from 1.2 to 1.9 cm and increase in left para-aortic adenopathy from 0.9 to 1.3 cm. Periportal adenopathy was stable at 1.4 cm. Second line immunotherapy was recommended. His initial biopsy specimen did not have enough sample to undergo FGFR mutation testing. He also has mild interstitial lung disease for which he sees  pulmonary but he is not on home oxygen. He has B12 deficiency for which he is on oral B12. Also has diabetes and coronary artery disease.Tecentriq started on 06/17/2018.Patient noted to have disease progression in his lymph nodes in May 2020. Repeat biopsy showed metastatic urothelial carcinoma which did not have aFGFR mutation. He has been started on third line Padcev  Treatment on hold since April 2021 due to neuropathy and to maintain quality of lifebut restarted in August 2021after he was found to have progression of diseasewhich he has 1 week on 3 weeks off  Interval history- Patient has returned to his usual state of health after padcev was held. Concerned of not eating enough and weight loss. Weight down 2 lbs. Doesn't eat meat and limits diet due to diabetes. Neuropathy is stable. Fatigue has improved. Pain is stable.   ECOG PS- 1 Pain scale- 0 Opioid associated constipation- no  Review of systems- Review of Systems  Constitutional: Negative for chills, fever, malaise/fatigue and weight loss.  HENT: Negative for congestion, ear discharge and nosebleeds.   Eyes: Negative for blurred vision.  Respiratory: Negative for cough, hemoptysis, sputum production, shortness of breath and wheezing.   Cardiovascular: Negative for chest pain, palpitations, orthopnea and claudication.  Gastrointestinal: Negative for abdominal pain, blood in stool, constipation, diarrhea, heartburn, melena, nausea and vomiting.  Genitourinary: Negative for dysuria, flank pain, frequency, hematuria and urgency.  Musculoskeletal: Negative for back pain, joint pain and myalgias.  Skin: Negative for rash.  Neurological: Negative for dizziness, tingling, sensory change, focal weakness, seizures, weakness and headaches.  Endo/Heme/Allergies: Does not bruise/bleed easily.  Psychiatric/Behavioral: Negative for depression and suicidal ideas. The patient does not have insomnia.       Allergies  Allergen  Reactions  .  Sulfa Antibiotics Other (See Comments)    Joint pain  . Ace Inhibitors Cough  . Invokana [Canagliflozin] Other (See Comments)    Leg pain  . Penicillins Rash    Did it involve swelling of the face/tongue/throat, SOB, or low BP? No Did it involve sudden or severe rash/hives, skin peeling, or any reaction on the inside of your mouth or nose? Yes Did you need to seek medical attention at a hospital or doctor's office? Yes When did it last happen?childhood allergy If all above answers are "NO", may proceed with cephalosporin use.      Past Medical History:  Diagnosis Date  . Acid reflux   . Anxiety   . Depression   . Diabetes mellitus without complication ([place])   . Hyperlipidemia   . Hypertension   . MRSA (methicillin resistant Staphylococcus aureus) infection 1992   history  . Myocardial infarction ([place]) 1995  . Sleep apnea    CPAP  . Urothelial cancer ([place]) 11/2017   Left Urothelial mass, chemo tx's     Past Surgical History:  Procedure Laterality Date  . CATARACT EXTRACTION Left   . COLONOSCOPY  2010   [facility]  . COLONOSCOPY WITH PROPOFOL N/A 10/04/2016   Procedure: COLONOSCOPY WITH PROPOFOL;  Surgeon: [name], MD;  Location: [facility] ENDOSCOPY;  Service: Endoscopy;  Laterality: N/A;  . [place], 2000, 2001, 2014  . CYSTOSCOPY W/ RETROGRADES Bilateral 12/10/2017   Procedure: CYSTOSCOPY WITH RETROGRADE PYELOGRAM;  Surgeon: [name], MD;  Location: [facility] ORS;  Service: Urology;  Laterality: Bilateral;  . CYSTOSCOPY W/ RETROGRADES Left 12/09/2018   Procedure: CYSTOSCOPY WITH RETROGRADE PYELOGRAM;  Surgeon: [name], MD;  Location: [facility] ORS;  Service: Urology;  Laterality: Left;  . CYSTOSCOPY WITH BIOPSY Left 12/09/2018   Procedure: CYSTOSCOPY WITH URETERAL/RENAL PELVIC BIOPSY;  Surgeon: [name], MD;  Location: [facility] ORS;  Service: Urology;  Laterality: Left;  . CYSTOSCOPY WITH STENT PLACEMENT  Left 12/10/2017   Procedure: CYSTOSCOPY WITH STENT PLACEMENT;  Surgeon: [name], MD;  Location: [facility] ORS;  Service: Urology;  Laterality: Left;  . CYSTOSCOPY WITH STENT PLACEMENT Left 12/09/2018   Procedure: CYSTOSCOPY WITH STENT PLACEMENT;  Surgeon: [name], MD;  Location: [facility] ORS;  Service: Urology;  Laterality: Left;  . CYSTOSCOPY WITH URETEROSCOPY Left 12/09/2018   Procedure: CYSTOSCOPY WITH URETEROSCOPY;  Surgeon: [name], MD;  Location: [facility] ORS;  Service: Urology;  Laterality: Left;  . EYE SURGERY Bilateral    cataract  . INGUINAL HERNIA REPAIR Right 08/09/2015   Procedure: HERNIA REPAIR INGUINAL ADULT;  Surgeon: [name], MD;  Location: [facility] ORS;  Service: General;  Laterality: Right;  . NASAL SINUS SURGERY    . nuclear stress test    . PORTA CATH INSERTION N/A 12/19/2017   Procedure: PORTA CATH INSERTION;  Surgeon: [name], MD;  Location: [facility];  Service: Cardiovascular;  Laterality: N/A;  . TOOTH EXTRACTION  05/28/2020   upper left and it was molars  . URETERAL BIOPSY Left 12/10/2017   Procedure: URETERAL & renal PELVIS BIOPSY;  Surgeon: [name], MD;  Location: [facility] ORS;  Service: Urology;  Laterality: Left;  . URETEROSCOPY Left 12/10/2017   Procedure: URETEROSCOPY;  Surgeon: [name], MD;  Location: [facility] ORS;  Service: Urology;  Laterality: Left;    Social History   Socioeconomic History  . Marital status: Married    Spouse name: [name]  . Number of children: 1  .  Years of education: Not on file  . Highest education level: Associate degree: occupational, Hotel manager, or vocational program  Occupational History  . Occupation: retired  Tobacco Use  . Smoking status: Former Smoker    Types: Cigars    Quit date: 10/09/1978    Years since quitting: 41.9  . Smokeless tobacco: Former Systems developer    Types: Chew    Quit date: 12/04/1988  . Tobacco comment: on occasion  Vaping Use  . Vaping Use: Never used  Substance and Sexual  Activity  . Alcohol use: Not Currently  . Drug use: No  . Sexual activity: Not Currently  Other Topics Concern  . Not on file  Social History Narrative  . Not on file   Social Determinants of Health   Financial Resource Strain: Low Risk   . Difficulty of Paying Living Expenses: Not hard at all  Food Insecurity: No Food Insecurity  . Worried About Charity fundraiser in the Last Year: Never true  . Ran Out of Food in the Last Year: Never true  Transportation Needs: No Transportation Needs  . Lack of Transportation (Medical): No  . Lack of Transportation (Non-Medical): No  Physical Activity: Insufficiently Active  . Days of Exercise per Week: 3 days  . Minutes of Exercise per Session: 30 min  Stress: No Stress Concern Present  . Feeling of Stress : Not at all  Social Connections: Moderately Integrated  . Frequency of Communication with Friends and Family: Three times a week  . Frequency of Social Gatherings with Friends and Family: More than three times a week  . Attends Religious Services: More than 4 times per year  . Active Member of Clubs or Organizations: No  . Attends Archivist Meetings: Never  . Marital Status: Married  Human resources officer Violence: Not At Risk  . Fear of Current or Ex-Partner: No  . Emotionally Abused: No  . Physically Abused: No  . Sexually Abused: No    Family History  Problem Relation Age of Onset  . Heart disease Mother   . Cancer Father        Lung and colon cancer  . Heart disease Father   . Emphysema Maternal Grandfather   . Tuberculosis Maternal Grandmother      Current Outpatient Medications:  .  aspirin EC 81 MG tablet, Take 81 mg by mouth every evening. , Disp: , Rfl:  .  atorvastatin (LIPITOR) 40 MG tablet, TAKE 1 TABLET BY MOUTH AT BEDTIME, Disp: 90 tablet, Rfl: 0 .  blood glucose meter kit and supplies, Dispense based on patient and insurance preference. Use once daily as directed. (FOR ICD-10 E11.9)., Disp: 1 each, Rfl:  0 .  Cholecalciferol (VITAMIN D3) 25 MCG (1000 UT) CAPS, Take 1 capsule by mouth daily., Disp: , Rfl:  .  clobetasol cream (TEMOVATE) 8.30 %, Apply 1 application topically as needed (for skin rash)., Disp: 30 g, Rfl: 1 .  empagliflozin (JARDIANCE) 10 MG TABS tablet, Take 1 tablet (10 mg total) by mouth daily., Disp: 90 tablet, Rfl: 2 .  glucose blood (ONETOUCH ULTRA) test strip, Use as instructed, Disp: 100 each, Rfl: 12 .  Ivermectin 1 % CREA, Apply 1 application topically daily as needed (rosacea). To face, Disp: , Rfl:  .  Lancets (ONETOUCH ULTRASOFT) lancets, Use as instructed, Disp: 100 each, Rfl: 12 .  lidocaine-prilocaine (EMLA) cream, Apply 1 application topically as needed (port access)., Disp: 1 g, Rfl: 3 .  losartan (COZAAR) 50 MG tablet,  Take 1 tablet by mouth once daily, Disp: 90 tablet, Rfl: 0 .  magnesium hydroxide (MILK OF MAGNESIA) 400 MG/5ML suspension, Take 15 mLs by mouth daily as needed for mild constipation., Disp: , Rfl:  .  metFORMIN (GLUCOPHAGE) 1000 MG tablet, TAKE 1 TABLET BY MOUTH TWICE DAILY WITH MEALS, Disp: 180 tablet, Rfl: 0 .  nystatin cream (MYCOSTATIN), Apply topically 2 (two) times daily., Disp: 15 g, Rfl: 1 .  OVER THE COUNTER MEDICATION, Apply 1 application topically daily as needed (pain). Outback topical pain relief , Disp: , Rfl:  .  pregabalin (LYRICA) 75 MG capsule, Take 1 capsule by mouth once daily, Disp: 90 capsule, Rfl: 1 .  psyllium (METAMUCIL) 58.6 % powder, Take 1 packet by mouth daily., Disp: , Rfl:  .  sertraline (ZOLOFT) 50 MG tablet, Take 50 mg by mouth daily., Disp: , Rfl:  .  vitamin B-12 (CYANOCOBALAMIN) 1000 MCG tablet, Take by mouth daily. Unsure dose, Disp: , Rfl:  .  vitamin C (ASCORBIC ACID) 500 MG tablet, Take 1,000-1,500 mg by mouth 2 (two) times daily., Disp: , Rfl:  .  Blood Glucose Monitoring Suppl (ONE TOUCH ULTRA 2) w/Device KIT, 1 each by Does not apply route once for 1 dose. Test blood sugars once daily, Disp: 1 kit, Rfl: 0 .   fluticasone (FLONASE) 50 MCG/ACT nasal spray, Use 2 spray(s) in each nostril once daily (Patient not taking: No sig reported), Disp: 48 g, Rfl: 3 .  hydrOXYzine (ATARAX/VISTARIL) 25 MG tablet, Take 1 tablet (25 mg total) by mouth every 8 (eight) hours as needed for itching. (Patient not taking: Reported on 09/13/2020), Disp: 30 tablet, Rfl: 3 .  magic mouthwash w/lidocaine SOLN, Take 5 mLs by mouth 4 (four) times daily as needed for mouth pain. (Patient not taking: No sig reported), Disp: 480 mL, Rfl: 3 No current facility-administered medications for this visit.  Facility-Administered Medications Ordered in Other Visits:  .  heparin lock flush 100 unit/mL, 500 Units, Intravenous, Once, Sindy Guadeloupe, MD .  Influenza vac split quadrivalent PF (FLUZONE HIGH-DOSE) injection 0.5 mL, 0.5 mL, Intramuscular, Once, Karen Kitchens, NP  Physical exam:  Vitals:   09/13/20 1042  BP: 107/73  Pulse: 79  Resp: 17  Temp: (!) 96.9 F (36.1 C)  TempSrc: Oral  SpO2: 100%  Weight: 159 lb 2 oz (72.2 kg)   Physical Exam Constitutional:      General: He is not in acute distress. Cardiovascular:     Rate and Rhythm: Normal rate and regular rhythm.     Heart sounds: Normal heart sounds.  Pulmonary:     Effort: Pulmonary effort is normal.     Breath sounds: Normal breath sounds.  Abdominal:     General: Bowel sounds are normal.     Palpations: Abdomen is soft.  Musculoskeletal:     Comments: Ambulating w/o aids  Skin:    General: Skin is warm and dry.  Neurological:     Mental Status: He is alert and oriented to person, place, and time.  Psychiatric:        Mood and Affect: Mood normal.        Behavior: Behavior normal.      CMP Latest Ref Rng & Units 09/13/2020  Glucose 70 - 99 mg/dL 113(H)  BUN 8 - 23 mg/dL 30(H)  Creatinine 0.61 - 1.24 mg/dL 0.85  Sodium 135 - 145 mmol/L 133(L)  Potassium 3.5 - 5.1 mmol/L 4.0  Chloride 98 - 111 mmol/L 96(L)  CO2 22 - 32 mmol/L 24  Calcium 8.9 - 10.3 mg/dL  9.6  Total Protein 6.5 - 8.1 g/dL 7.6  Total Bilirubin 0.3 - 1.2 mg/dL 1.2  Alkaline Phos 38 - 126 U/L 97  AST 15 - 41 U/L 18  ALT 0 - 44 U/L 12   CBC Latest Ref Rng & Units 09/13/2020  WBC 4.0 - 10.5 K/uL 8.4  Hemoglobin 13.0 - 17.0 g/dL 12.3(L)  Hematocrit 39.0 - 52.0 % 36.3(L)  Platelets 150 - 400 K/uL 268    Assessment and plan- Patient is a 75 y.o. 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 He is here for on treatment assessment prior to next cycle of Padcev  Padcev held d/t elevated bilirubin previously. Today, bilirubin has returned to normal at 1.2. Etiology of elevated bilirubin is unclear. Counts reviewed and acceptable for continuation of treatment. Proceed with Padcev today. Continue plan for 3 weeks on, 1 week off dosing for better tolerance and low volume disease. If bilirubin elevates at next visit, consider RUQ ultrasound and GGT.   Follow up with Dr. Janese Banks as scheduled for re-evaluation on 10/11/20 and consideration of continuation of Padcev.    Visit Diagnosis 1. Encounter for antineoplastic chemotherapy   2. Urothelial carcinoma of kidney, left ([place])      Beckey Rutter, DNP, AGNP-C [place] at [facility] [phone] (clinic)

## 2020-09-23 ENCOUNTER — Other Ambulatory Visit: Payer: Self-pay | Admitting: Family Medicine

## 2020-09-23 NOTE — Telephone Encounter (Signed)
Requested medication (s) are due for refill today: {Yes  Requested medication (s) are on the active medication list: yes  Last refill:  05/27/2020  Future visit scheduled: no  Notes to clinic: overdue for follow up appointment  Last appt was canceled by provider    Requested Prescriptions  Pending Prescriptions Disp Refills   metFORMIN (GLUCOPHAGE) 1000 MG tablet [Pharmacy Med Name: metFORMIN HCl 1000 MG Oral Tablet] 180 tablet 0    Sig: TAKE 1 TABLET BY [place]      Endocrinology:  Diabetes - Biguanides Failed - 09/23/2020 11:03 AM      Failed - HBA1C is between 0 and 7.9 and within 180 days    Hgb A1c MFr Bld  Date Value Ref Range Status  12/25/2019 7.9 (H) 4.8 - 5.6 % Final    Comment:             Prediabetes: 5.7 - 6.4          Diabetes: >6.4          Glycemic control for adults with diabetes: <7.0           Failed - Valid encounter within last 6 months    Recent Outpatient Visits           6 months ago Medicare annual wellness visit, subsequent   [facility], Retia Passe., MD   9 months ago Type 2 diabetes mellitus without complication, without long-term current use of insulin ([place])   [facility] Jerrol Banana., MD   1 year ago Type 2 diabetes mellitus without complication, without long-term current use of insulin [facility])   [facility] Jerrol Banana., MD   1 year ago Essential hypertension   [facility] Jerrol Banana., MD   1 year ago Type 2 diabetes mellitus without complication, without long-term current use of insulin [facility])   [facility] Jerrol Banana., MD       Future Appointments             In 2 weeks Borders, Kirt Boys, NP [place] [facility]             Passed - Cr in normal range and within 360 days    Creatinine  Date Value Ref Range Status  02/03/2013 0.69 0.60 - 1.30 mg/dL Final    Creatinine, Ser  Date Value Ref Range Status  09/13/2020 0.85 0.61 - 1.24 mg/dL Final          Passed - eGFR in normal range and within 360 days    EGFR (African American)  Date Value Ref Range Status  02/03/2013 >60  Final   GFR calc Af Amer  Date Value Ref Range Status  04/06/2020 >60 >60 mL/min Final   EGFR (Non-African Amer.)  Date Value Ref Range Status  02/03/2013 >60  Final    Comment:    eGFR values <27mL/min/1.73 m2 may be an indication of chronic kidney disease (CKD). Calculated eGFR is useful in patients with stable renal function. The eGFR calculation will not be reliable in acutely ill patients when serum creatinine is changing rapidly. It is not useful in  patients on dialysis. The eGFR calculation may not be applicable to patients at the low and high extremes of body sizes, pregnant women, and vegetarians.    GFR, Estimated  Date Value Ref Range Status  09/13/2020 >60 >60 mL/min Final  Comment:    (NOTE) Calculated using the CKD-EPI Creatinine Equation (2021)

## 2020-09-25 ENCOUNTER — Other Ambulatory Visit: Payer: Self-pay | Admitting: Family Medicine

## 2020-09-25 DIAGNOSIS — F32A Depression, unspecified: Secondary | ICD-10-CM

## 2020-09-29 LAB — HM DIABETES EYE EXAM

## 2020-10-09 ENCOUNTER — Other Ambulatory Visit: Payer: Self-pay | Admitting: Family Medicine

## 2020-10-09 NOTE — Telephone Encounter (Signed)
Pt sent message on MyChart to call office to make appt.  Pt overdue for lab work and follow up.  Last RF 05/27/20 #180 Last Hgb A1C:12/25/19: 7.9%

## 2020-10-11 ENCOUNTER — Encounter: Payer: Self-pay | Admitting: Oncology

## 2020-10-11 ENCOUNTER — Inpatient Hospital Stay: Payer: HMO | Attending: Oncology

## 2020-10-11 ENCOUNTER — Inpatient Hospital Stay: Payer: HMO

## 2020-10-11 ENCOUNTER — Inpatient Hospital Stay (HOSPITAL_BASED_OUTPATIENT_CLINIC_OR_DEPARTMENT_OTHER): Payer: HMO | Admitting: Hospice and Palliative Medicine

## 2020-10-11 ENCOUNTER — Inpatient Hospital Stay (HOSPITAL_BASED_OUTPATIENT_CLINIC_OR_DEPARTMENT_OTHER): Payer: HMO | Admitting: Oncology

## 2020-10-11 VITALS — BP 108/79 | HR 71 | Temp 98.5°F | Resp 16 | Wt 157.9 lb

## 2020-10-11 DIAGNOSIS — D701 Agranulocytosis secondary to cancer chemotherapy: Secondary | ICD-10-CM

## 2020-10-11 DIAGNOSIS — Z515 Encounter for palliative care: Secondary | ICD-10-CM

## 2020-10-11 DIAGNOSIS — Z5112 Encounter for antineoplastic immunotherapy: Secondary | ICD-10-CM | POA: Insufficient documentation

## 2020-10-11 DIAGNOSIS — Z5111 Encounter for antineoplastic chemotherapy: Secondary | ICD-10-CM | POA: Diagnosis not present

## 2020-10-11 DIAGNOSIS — C642 Malignant neoplasm of left kidney, except renal pelvis: Secondary | ICD-10-CM

## 2020-10-11 DIAGNOSIS — T451X5A Adverse effect of antineoplastic and immunosuppressive drugs, initial encounter: Secondary | ICD-10-CM

## 2020-10-11 LAB — CBC WITH DIFFERENTIAL/PLATELET
Abs Immature Granulocytes: 0.01 10*3/uL (ref 0.00–0.07)
Basophils Absolute: 0.1 10*3/uL (ref 0.0–0.1)
Basophils Relative: 1 %
Eosinophils Absolute: 0.3 10*3/uL (ref 0.0–0.5)
Eosinophils Relative: 4 %
HCT: 36.5 % — ABNORMAL LOW (ref 39.0–52.0)
Hemoglobin: 12.1 g/dL — ABNORMAL LOW (ref 13.0–17.0)
Immature Granulocytes: 0 %
Lymphocytes Relative: 18 %
Lymphs Abs: 1.5 10*3/uL (ref 0.7–4.0)
MCH: 29 pg (ref 26.0–34.0)
MCHC: 33.2 g/dL (ref 30.0–36.0)
MCV: 87.5 fL (ref 80.0–100.0)
Monocytes Absolute: 0.6 10*3/uL (ref 0.1–1.0)
Monocytes Relative: 8 %
Neutro Abs: 5.7 10*3/uL (ref 1.7–7.7)
Neutrophils Relative %: 69 %
Platelets: 236 10*3/uL (ref 150–400)
RBC: 4.17 MIL/uL — ABNORMAL LOW (ref 4.22–5.81)
RDW: 15 % (ref 11.5–15.5)
WBC: 8.2 10*3/uL (ref 4.0–10.5)
nRBC: 0 % (ref 0.0–0.2)

## 2020-10-11 LAB — COMPREHENSIVE METABOLIC PANEL
ALT: 9 U/L (ref 0–44)
AST: 17 U/L (ref 15–41)
Albumin: 3.8 g/dL (ref 3.5–5.0)
Alkaline Phosphatase: 87 U/L (ref 38–126)
Anion gap: 10 (ref 5–15)
BUN: 20 mg/dL (ref 8–23)
CO2: 26 mmol/L (ref 22–32)
Calcium: 9.6 mg/dL (ref 8.9–10.3)
Chloride: 101 mmol/L (ref 98–111)
Creatinine, Ser: 0.91 mg/dL (ref 0.61–1.24)
GFR, Estimated: >60 mL/min (ref >60)
Glucose, Bld: 178 mg/dL — ABNORMAL HIGH (ref 70–99)
Potassium: 4.3 mmol/L (ref 3.5–5.1)
Sodium: 137 mmol/L (ref 135–145)
Total Bilirubin: 0.9 mg/dL (ref 0.3–1.2)
Total Protein: 7.2 g/dL (ref 6.5–8.1)

## 2020-10-11 MED ORDER — SODIUM CHLORIDE 0.9 % IV SOLN
1.0000 mg/kg | Freq: Once | INTRAVENOUS | Status: AC
  Administered 2020-10-11: 70 mg via INTRAVENOUS
  Filled 2020-10-11: qty 4

## 2020-10-11 MED ORDER — HEPARIN SOD (PORK) LOCK FLUSH 100 UNIT/ML IV SOLN
500.0000 [IU] | Freq: Once | INTRAVENOUS | Status: AC | PRN
  Administered 2020-10-11: 500 [IU]
  Filled 2020-10-11: qty 5

## 2020-10-11 MED ORDER — SODIUM CHLORIDE 0.9 % IV SOLN
Freq: Once | INTRAVENOUS | Status: AC
  Filled 2020-10-11: qty 250

## 2020-10-11 MED ORDER — HEPARIN SOD (PORK) LOCK FLUSH 100 UNIT/ML IV SOLN
INTRAVENOUS | Status: AC
  Filled 2020-10-11: qty 5

## 2020-10-11 MED ORDER — PALONOSETRON HCL INJECTION 0.25 MG/5ML
0.2500 mg | Freq: Once | INTRAVENOUS | Status: AC
Start: 2020-10-11 — End: 2020-10-11
  Administered 2020-10-11: 0.25 mg via INTRAVENOUS
  Filled 2020-10-11: qty 5

## 2020-10-11 NOTE — Progress Notes (Signed)
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 [name] so far for his metastatic urothelial carcinoma. This was originally diagnosed in May 2019. He was noted to have a filling defect in the lower pole collecting system of the left kidney along with para-aortic and retroperitoneal adenopathy concerning for metastatic disease. He underwent left ureteroscopy and renal pelvis biopsy which revealed small fragments of high-grade urothelial carcinoma with small focus of invasion. He was started on carboplatin and gemcitabine chemotherapy in June 2019 and was continued on 05/27/2018 for 8 cycles. He tolerated chemotherapy well except for chemo-induced anemia for which she has been getting Procrit every 2 weeks. He did have response to his disease based on scans in August 2019. However repeat scan on 05/31/2018 showed increase in the size of the primary tumor from 1.2 to 1.9 cm and increase in left para-aortic adenopathy from 0.9 to 1.3 cm. Periportal adenopathy was stable at 1.4 cm. Second line immunotherapy was recommended. His initial biopsy specimen did not have enough sample to undergo FGFR mutation testing. He also has mild interstitial lung disease for which he sees  pulmonary but he is not on home oxygen. He has B12 deficiency for which he is on oral B12. Also has diabetes and coronary artery disease.Tecentriq started on 06/17/2018.Patient noted to have disease progression in his lymph nodes in May 2020. Repeat biopsy showed metastatic urothelial carcinoma which did not have aFGFR mutation. He has been started on third line Padcev  Treatment on hold since April 2021 due to neuropathy and to maintain quality of lifebut restarted in August 2021after he was found to have progression of diseasewhich he has 1 week on 3 weeks off  Interval history-patient is here with his wife today.  Wife's main concern is that Damany sleeps most of the day about 14 to 16 hours a day.  He goes to sleep at 4 in the morning and mostly sleeps until 3 PM.  Due to this his oral intake is poor and he is not active during the day.  He has been feeling more fatigued.  Neuropathy is presently stable with Lyrica.  He did have a fall when he was out 1 day.  ECOG PS- 2 Pain scale- 0   Review of systems- Review of Systems  Constitutional: Positive for malaise/fatigue. Negative for chills, fever and weight loss.  HENT: Negative for congestion, ear discharge and nosebleeds.   Eyes: Negative for blurred vision.  Respiratory: Negative for cough, hemoptysis, sputum production, shortness of breath and wheezing.   Cardiovascular: Negative for chest pain, palpitations, orthopnea and claudication.  Gastrointestinal: Negative for abdominal pain, blood in stool, constipation, diarrhea, heartburn, melena, nausea and vomiting.  Genitourinary: Negative for dysuria, flank pain, frequency, hematuria and urgency.  Musculoskeletal: Negative for back pain, joint pain and myalgias.  Skin: Negative for rash.  Neurological: Negative for dizziness, tingling, focal  weakness, seizures, weakness and headaches.  Endo/Heme/Allergies: Does not bruise/bleed easily.  Psychiatric/Behavioral: Negative for  depression and suicidal ideas. The patient does not have insomnia.       Allergies  Allergen Reactions  . Sulfa Antibiotics Other (See Comments)    Joint pain  . Ace Inhibitors Cough  . Invokana [Canagliflozin] Other (See Comments)    Leg pain  . Penicillins Rash    Did it involve swelling of the face/tongue/throat, SOB, or low BP? No Did it involve sudden or severe rash/hives, skin peeling, or any reaction on the inside of your mouth or nose? Yes Did you need to seek medical attention at a hospital or doctor's office? Yes When did it last happen?childhood allergy If all above answers are "NO", may proceed with cephalosporin use.      Past Medical History:  Diagnosis Date  . Acid reflux   . Anxiety   . Depression   . Diabetes mellitus without complication ([place])   . Hyperlipidemia   . Hypertension   . MRSA (methicillin resistant Staphylococcus aureus) infection 1992   history  . Myocardial infarction ([place]) 1995  . Sleep apnea    CPAP  . Urothelial cancer ([place]) 11/2017   Left Urothelial mass, chemo tx's     Past Surgical History:  Procedure Laterality Date  . CATARACT EXTRACTION Left   . COLONOSCOPY  2010   [facility]  . COLONOSCOPY WITH PROPOFOL N/A 10/04/2016   Procedure: COLONOSCOPY WITH PROPOFOL;  Surgeon: [name], MD;  Location: [facility] ENDOSCOPY;  Service: Endoscopy;  Laterality: N/A;  . [place], 2000, 2001, 2014  . CYSTOSCOPY W/ RETROGRADES Bilateral 12/10/2017   Procedure: CYSTOSCOPY WITH RETROGRADE PYELOGRAM;  Surgeon: [name], MD;  Location: [facility] ORS;  Service: Urology;  Laterality: Bilateral;  . CYSTOSCOPY W/ RETROGRADES Left 12/09/2018   Procedure: CYSTOSCOPY WITH RETROGRADE PYELOGRAM;  Surgeon: [name], MD;  Location: [facility] ORS;  Service: Urology;  Laterality: Left;  . CYSTOSCOPY WITH BIOPSY Left 12/09/2018   Procedure: CYSTOSCOPY WITH URETERAL/RENAL PELVIC BIOPSY;  Surgeon: [name], MD;   Location: [facility] ORS;  Service: Urology;  Laterality: Left;  . CYSTOSCOPY WITH STENT PLACEMENT Left 12/10/2017   Procedure: CYSTOSCOPY WITH STENT PLACEMENT;  Surgeon: [name], MD;  Location: [facility] ORS;  Service: Urology;  Laterality: Left;  . CYSTOSCOPY WITH STENT PLACEMENT Left 12/09/2018   Procedure: CYSTOSCOPY WITH STENT PLACEMENT;  Surgeon: [name], MD;  Location: [facility] ORS;  Service: Urology;  Laterality: Left;  . CYSTOSCOPY WITH URETEROSCOPY Left 12/09/2018   Procedure: CYSTOSCOPY WITH URETEROSCOPY;  Surgeon: [name], MD;  Location: [facility] ORS;  Service: Urology;  Laterality: Left;  . EYE SURGERY Bilateral    cataract  . INGUINAL HERNIA REPAIR Right 08/09/2015   Procedure: HERNIA REPAIR INGUINAL ADULT;  Surgeon: [name], MD;  Location: [facility] ORS;  Service: General;  Laterality: Right;  . NASAL SINUS SURGERY    . nuclear stress test    . PORTA CATH INSERTION N/A 12/19/2017   Procedure: PORTA CATH INSERTION;  Surgeon: [name], MD;  Location: [facility] CV LAB;  Service: Cardiovascular;  Laterality: N/A;  . TOOTH EXTRACTION  05/28/2020   upper left and it was molars  . URETERAL BIOPSY Left 12/10/2017   Procedure: URETERAL & renal PELVIS BIOPSY;  Surgeon: [name], MD;  Location: [facility] ORS;  Service: Urology;  Laterality: Left;  . URETEROSCOPY Left 12/10/2017   Procedure: URETEROSCOPY;  Surgeon: [name], MD;  Location: [facility] ORS;  Service: Urology;  Laterality: Left;    Social History   Socioeconomic History  . Marital status: Married    Spouse name: [name]  . Number of children: 1  . Years of education: Not on file  . Highest education level: Associate degree: occupational, Hotel manager, or vocational program  Occupational History  . Occupation: retired  Tobacco Use  . Smoking status: Former Smoker    Types: Cigars    Quit date: 10/09/1978    Years since quitting: 42.0  . Smokeless tobacco: Former Systems developer    Types: Chew    Quit date: 12/04/1988   . Tobacco comment: on occasion  Vaping Use  . Vaping Use: Never used  Substance and Sexual Activity  . Alcohol use: Not Currently  . Drug use: No  . Sexual activity: Not Currently  Other Topics Concern  . Not on file  Social History Narrative  . Not on file   Social Determinants of Health   Financial Resource Strain: Low Risk   . Difficulty of Paying Living Expenses: Not hard at all  Food Insecurity: No Food Insecurity  . Worried About Charity fundraiser in the Last Year: Never true  . Ran Out of Food in the Last Year: Never true  Transportation Needs: No Transportation Needs  . Lack of Transportation (Medical): No  . Lack of Transportation (Non-Medical): No  Physical Activity: Insufficiently Active  . Days of Exercise per Week: 3 days  . Minutes of Exercise per Session: 30 min  Stress: No Stress Concern Present  . Feeling of Stress : Not at all  Social Connections: Moderately Integrated  . Frequency of Communication with Friends and Family: Three times a week  . Frequency of Social Gatherings with Friends and Family: More than three times a week  . Attends Religious Services: More than 4 times per year  . Active Member of Clubs or Organizations: No  . Attends Archivist Meetings: Never  . Marital Status: Married  Human resources officer Violence: Not At Risk  . Fear of Current or Ex-Partner: No  . Emotionally Abused: No  . Physically Abused: No  . Sexually Abused: No    Family History  Problem Relation Age of Onset  . Heart disease Mother   . Cancer Father        Lung and colon cancer  . Heart disease Father   . Emphysema Maternal Grandfather   . Tuberculosis Maternal Grandmother      Current Outpatient Medications:  .  aspirin EC 81 MG tablet, Take 81 mg by mouth every evening. , Disp: , Rfl:  .  atorvastatin (LIPITOR) 40 MG tablet, TAKE 1 TABLET BY MOUTH AT BEDTIME, Disp: 90 tablet, Rfl: 0 .  blood glucose meter kit and supplies, Dispense based on  patient and insurance preference. Use once daily as directed. (FOR ICD-10 E11.9)., Disp: 1 each, Rfl: 0 .  Blood Glucose Monitoring Suppl (ONE TOUCH ULTRA 2) w/Device KIT, 1 each by Does not apply route once for 1 dose. Test blood sugars once daily, Disp: 1 kit, Rfl: 0 .  Cholecalciferol (VITAMIN D3) 25 MCG (1000 UT) CAPS, Take 1 capsule by mouth daily., Disp: , Rfl:  .  clobetasol cream (TEMOVATE) 1.88 %, Apply 1 application topically as needed (for skin rash)., Disp: 30 g, Rfl: 1 .  empagliflozin (JARDIANCE) 10 MG TABS tablet, Take 1 tablet (10 mg total) by mouth daily., Disp: 90 tablet, Rfl: 2 .  fluticasone (FLONASE) 50  MCG/ACT nasal spray, Use 2 spray(s) in each nostril once daily, Disp: 48 g, Rfl: 3 .  glucose blood (ONETOUCH ULTRA) test strip, Use as instructed, Disp: 100 each, Rfl: 12 .  hydrOXYzine (ATARAX/VISTARIL) 25 MG tablet, Take 1 tablet (25 mg total) by mouth every 8 (eight) hours as needed for itching., Disp: 30 tablet, Rfl: 3 .  Ivermectin 1 % CREA, Apply 1 application topically daily as needed (rosacea). To face, Disp: , Rfl:  .  Lancets (ONETOUCH ULTRASOFT) lancets, Use as instructed, Disp: 100 each, Rfl: 12 .  lidocaine-prilocaine (EMLA) cream, Apply 1 application topically as needed (port access)., Disp: 1 g, Rfl: 3 .  losartan (COZAAR) 50 MG tablet, Take 1 tablet by mouth once daily, Disp: 90 tablet, Rfl: 0 .  magic mouthwash w/lidocaine SOLN, Take 5 mLs by mouth 4 (four) times daily as needed for mouth pain., Disp: 480 mL, Rfl: 3 .  magnesium hydroxide (MILK OF MAGNESIA) 400 MG/5ML suspension, Take 15 mLs by mouth daily as needed for mild constipation., Disp: , Rfl:  .  metFORMIN (GLUCOPHAGE) 1000 MG tablet, TAKE 1 TABLET BY MOUTH TWICE DAILY WITH MEALS, Disp: 180 tablet, Rfl: 0 .  nystatin cream (MYCOSTATIN), Apply topically 2 (two) times daily., Disp: 15 g, Rfl: 1 .  OVER THE COUNTER MEDICATION, Apply 1 application topically daily as needed (pain). Outback topical pain  relief , Disp: , Rfl:  .  pregabalin (LYRICA) 75 MG capsule, Take 1 capsule by mouth once daily, Disp: 90 capsule, Rfl: 1 .  psyllium (METAMUCIL) 58.6 % powder, Take 1 packet by mouth daily., Disp: , Rfl:  .  sertraline (ZOLOFT) 50 MG tablet, Take 50 mg by mouth daily., Disp: , Rfl:  .  vitamin B-12 (CYANOCOBALAMIN) 1000 MCG tablet, Take by mouth daily. Unsure dose, Disp: , Rfl:  .  vitamin C (ASCORBIC ACID) 500 MG tablet, Take 1,000-1,500 mg by mouth 2 (two) times daily., Disp: , Rfl:  No current facility-administered medications for this visit.  Facility-Administered Medications Ordered in Other Visits:  .  enfortumab vedotin-ejfv (PADCEV) 70 mg in sodium chloride 0.9 % 50 mL (1.2281 mg/mL) chemo infusion, 1 mg/kg (Treatment Plan Recorded), Intravenous, Once, Sindy Guadeloupe, MD .  heparin lock flush 100 unit/mL, 500 Units, Intracatheter, Once PRN, Sindy Guadeloupe, MD .  Influenza vac split quadrivalent PF (FLUZONE HIGH-DOSE) injection 0.5 mL, 0.5 mL, Intramuscular, Once, Karen Kitchens, NP  Physical exam:  Vitals:   10/11/20 0904  BP: 108/79  Pulse: 71  Resp: 16  Temp: 98.5 F (36.9 C)  TempSrc: Tympanic  Weight: 157 lb 14.4 oz (71.6 kg)   Physical Exam Cardiovascular:     Rate and Rhythm: Normal rate.  Pulmonary:     Effort: Pulmonary effort is normal.  Skin:    General: Skin is warm and dry.  Neurological:     Mental Status: He is alert and oriented to person, place, and time.      CMP Latest Ref Rng & Units 10/11/2020  Glucose 70 - 99 mg/dL 178(H)  BUN 8 - 23 mg/dL 20  Creatinine 0.61 - 1.24 mg/dL 0.91  Sodium 135 - 145 mmol/L 137  Potassium 3.5 - 5.1 mmol/L 4.3  Chloride 98 - 111 mmol/L 101  CO2 22 - 32 mmol/L 26  Calcium 8.9 - 10.3 mg/dL 9.6  Total Protein 6.5 - 8.1 g/dL 7.2  Total Bilirubin 0.3 - 1.2 mg/dL 0.9  Alkaline Phos 38 - 126 U/L 87  AST 15 -  41 U/L 17  ALT 0 - 44 U/L 9   CBC Latest Ref Rng & Units 10/11/2020  WBC 4.0 - 10.5 K/uL 8.2  Hemoglobin 13.0 -  17.0 g/dL 12.1(L)  Hematocrit 39.0 - 52.0 % 36.5(L)  Platelets 150 - 400 K/uL 236     Assessment and plan- Patient is a 75 y.o. 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 Patient is here for next cycle of Padcev  Patient has been getting Padcev 1 week on 3 weeks off to maintain his quality of life.  This was controlling his disease for a while.  Then he had a break from treatment for about 3 months and repeat scan showed mild progression.  He is now back on the 1 week on 3-week off regimen.  He will proceed with next cycle today and I will see him back in 4 weeks for the following cycle.  Plan to repeat scan sometime in late May 2022.  Daytime somnolence and fatigue: I have again encouraged the patient to try to reverse his sleep cycle so he is more active during the day and can sleep at night instead of staying awake through the night and sleeping during the daytime.  Also encouraged him to participate involuntary exercise programs to counter fatigue.  Daytime Ritalin is an option to improve his energy levels but I would prefer nonmedication management at this time.   Visit Diagnosis 1. Urothelial carcinoma of kidney, left ([place])   2. Encounter for antineoplastic chemotherapy      Dr. Randa Evens, MD, MPH [facility] at [facility] [phone] 10/11/2020 10:51 AM

## 2020-10-11 NOTE — Progress Notes (Signed)
Patient here for pre treatment check reports that he is sleeping 17 hours a day, no other complaints.

## 2020-10-11 NOTE — Progress Notes (Signed)
I did not reach patient for scheduled virtual visit. VM left. Will reschedule.

## 2020-10-13 ENCOUNTER — Other Ambulatory Visit: Payer: Self-pay | Admitting: Family Medicine

## 2020-10-13 DIAGNOSIS — F32A Depression, unspecified: Secondary | ICD-10-CM

## 2020-10-13 NOTE — Telephone Encounter (Signed)
Requested medication (s) are due for refill today:   Yes  Requested medication (s) are on the active medication list:   Yes  Future visit scheduled:   No  Been notified he needs an appt.   30 day courtesy refill was given last month   Last ordered: 05/27/2020 #180, 0 refills  Returned because failed protocol due to no appts scheduled and 30 day courtesy refill was given last month.   Provider to review for refill.   Requested Prescriptions  Pending Prescriptions Disp Refills   metFORMIN (GLUCOPHAGE) 1000 MG tablet [Pharmacy Med Name: metFORMIN HCl 1000 MG Oral Tablet] 180 tablet 0    Sig: TAKE 1 TABLET BY MOUTH TWICE DAILY WITH MEALS      Endocrinology:  Diabetes - Biguanides Failed - 10/13/2020 10:15 AM      Failed - HBA1C is between 0 and 7.9 and within 180 days    Hgb A1c MFr Bld  Date Value Ref Range Status  12/25/2019 7.9 (H) 4.8 - 5.6 % Final    Comment:             Prediabetes: 5.7 - 6.4          Diabetes: >6.4          Glycemic control for adults with diabetes: <7.0           Failed - Valid encounter within last 6 months    Recent Outpatient Visits           6 months ago Medicare annual wellness visit, subsequent   [facility], Retia Passe., MD   10 months ago Type 2 diabetes mellitus without complication, without long-term current use of insulin [facility])   [facility] Jerrol Banana., MD   1 year ago Type 2 diabetes mellitus without complication, without long-term current use of insulin [facility])   [facility] Jerrol Banana., MD   1 year ago Essential hypertension   [facility] Jerrol Banana., MD   1 year ago Type 2 diabetes mellitus without complication, without long-term current use of insulin [facility]   [facility] Jerrol Banana., MD       Future Appointments             In 1 month Borders, Kirt Boys, NP [facility]              Passed - Cr in normal range and within 360 days    Creatinine  Date Value Ref Range Status  02/03/2013 0.69 0.60 - 1.30 mg/dL Final   Creatinine, Ser  Date Value Ref Range Status  10/11/2020 0.91 0.61 - 1.24 mg/dL Final          Passed - eGFR in normal range and within 360 days    EGFR (African American)  Date Value Ref Range Status  02/03/2013 >60  Final   GFR calc Af Amer  Date Value Ref Range Status  04/06/2020 >60 >60 mL/min Final   EGFR (Non-African Amer.)  Date Value Ref Range Status  02/03/2013 >60  Final    Comment:    eGFR values <50m/min/1.73 m2 may be an indication of chronic kidney disease (CKD). Calculated eGFR is useful in patients with stable renal function. The eGFR calculation will not be reliable in acutely ill patients when serum creatinine is changing rapidly. It is not useful in  patients on dialysis. The eGFR calculation may not  be applicable to patients at the low and high extremes of body sizes, pregnant women, and vegetarians.    GFR, Estimated  Date Value Ref Range Status  10/11/2020 >60 >60 mL/min Final    Comment:    (NOTE) Calculated using the CKD-EPI Creatinine Equation (2021)

## 2020-10-20 ENCOUNTER — Other Ambulatory Visit: Payer: Self-pay | Admitting: Family Medicine

## 2020-10-20 DIAGNOSIS — F32A Depression, unspecified: Secondary | ICD-10-CM

## 2020-10-20 NOTE — Telephone Encounter (Signed)
  Notes to clinic: Review medication for continued use and current dose   Requested Prescriptions  Pending Prescriptions Disp Refills   sertraline (ZOLOFT) 100 MG tablet [Pharmacy Med Name: Sertraline HCl 100 MG Oral Tablet] 90 tablet 0    Sig: Take 1 tablet by mouth once daily      Psychiatry:  Antidepressants - SSRI Failed - 10/20/2020  3:45 PM      Failed - Valid encounter within last 6 months    Recent Outpatient Visits           7 months ago Medicare annual wellness visit, subsequent   [facility] Jerrol Banana., MD   10 months ago Type 2 diabetes mellitus without complication, without long-term current use of insulin [facility])   [facility] Jerrol Banana., MD   1 year ago Type 2 diabetes mellitus without complication, without long-term current use of insulin [facility])   [facility] Jerrol Banana., MD   1 year ago Essential hypertension   [facility] Jerrol Banana., MD   1 year ago Type 2 diabetes mellitus without complication, without long-term current use of insulin [facility])   [facility] Jerrol Banana., MD       Future Appointments             In 3 weeks Jerrol Banana., MD [facility] [place]   In 1 month Borders, Kirt Boys, NP [facility]

## 2020-10-20 NOTE — Telephone Encounter (Signed)
Requested medication (s) are due for refill today - unsure- listed filled 3 months ago- but # and RF missing  Requested medication (s) are on the active medication list - yes- per note- dosage decreased from 100 mg to 50 mg  Future visit scheduled - yes  Last refill: 3 months  Notes to clinic: Patient requesting RF of medication prescribed by historical provider- sent for review of request  Requested Prescriptions  Pending Prescriptions Disp Refills   sertraline (ZOLOFT) 50 MG tablet      Sig: Take 1 tablet (50 mg total) by mouth daily.      Psychiatry:  Antidepressants - SSRI Failed - 10/20/2020  4:13 PM      Failed - Valid encounter within last 6 months    Recent Outpatient Visits           7 months ago Medicare annual wellness visit, subsequent   [facility] Jerrol Banana., MD   10 months ago Type 2 diabetes mellitus without complication, without long-term current use of insulin [facility])   [facility] Jerrol Banana., MD   1 year ago Type 2 diabetes mellitus without complication, without long-term current use of insulin [facility])   [facility] Jerrol Banana., MD   1 year ago Essential hypertension   [facility] Jerrol Banana., MD   1 year ago Type 2 diabetes mellitus without complication, without long-term current use of insulin [facility])   [facility] Jerrol Banana., MD       Future Appointments             In 3 weeks Jerrol Banana., MD [facility]   In 1 month Borders, Kirt Boys, NP [facility] [place] [facility]                 Requested Prescriptions  Pending Prescriptions Disp Refills   sertraline (ZOLOFT) 50 MG tablet      Sig: Take 1 tablet (50 mg total) by mouth daily.      Psychiatry:  Antidepressants - SSRI Failed - 10/20/2020  4:13 PM      Failed - Valid encounter within last 6 months    Recent  Outpatient Visits           7 months ago Medicare annual wellness visit, subsequent   [facility] Jerrol Banana., MD   10 months ago Type 2 diabetes mellitus without complication, without long-term current use of insulin [facility])   [facility] Jerrol Banana., MD   1 year ago Type 2 diabetes mellitus without complication, without long-term current use of insulin [facility])   [facility] Jerrol Banana., MD   1 year ago Essential hypertension   [facility] Jerrol Banana., MD   1 year ago Type 2 diabetes mellitus without complication, without long-term current use of insulin [facility])   [facility] Jerrol Banana., MD       Future Appointments             In 3 weeks Jerrol Banana., MD [facility] [place]   In 1 month Borders, Kirt Boys, NP [place] [facility]

## 2020-10-20 NOTE — Telephone Encounter (Signed)
Copied from [place] [id number]. Topic: Quick Communication - Rx Refill/Question >> Oct 20, 2020  3:56 PM Leward Quan A wrote: Medication: sertraline (ZOLOFT) 100 MG tablet   Has the patient contacted their pharmacy? Yes.   (Agent: If no, request that the patient contact the pharmacy for the refill.) (Agent: If yes, when and what did the pharmacy advise?)  Preferred Pharmacy (with phone number or street name): [place], [place]  Phone:  [phone] Fax:  [phone]     Agent: Please be advised that RX refills may take up to 3 business days. We ask that you follow-up with your pharmacy.

## 2020-10-21 MED ORDER — SERTRALINE HCL 50 MG PO TABS: 50.0000 mg | ORAL_TABLET | Freq: Every day | ORAL | 3 refills | Status: AC

## 2020-11-08 ENCOUNTER — Other Ambulatory Visit: Payer: Self-pay | Admitting: *Deleted

## 2020-11-08 ENCOUNTER — Ambulatory Visit: Payer: HMO | Admitting: Oncology

## 2020-11-08 ENCOUNTER — Other Ambulatory Visit: Payer: HMO

## 2020-11-08 ENCOUNTER — Inpatient Hospital Stay: Payer: HMO | Attending: Oncology

## 2020-11-08 ENCOUNTER — Ambulatory Visit: Payer: HMO

## 2020-11-08 ENCOUNTER — Other Ambulatory Visit: Payer: Self-pay

## 2020-11-08 ENCOUNTER — Inpatient Hospital Stay (HOSPITAL_BASED_OUTPATIENT_CLINIC_OR_DEPARTMENT_OTHER): Payer: HMO | Admitting: Oncology

## 2020-11-08 ENCOUNTER — Encounter: Payer: Self-pay | Admitting: Oncology

## 2020-11-08 ENCOUNTER — Inpatient Hospital Stay: Payer: HMO

## 2020-11-08 VITALS — BP 132/82 | HR 79 | Temp 98.1°F | Resp 16 | Ht 68.0 in | Wt 157.0 lb

## 2020-11-08 DIAGNOSIS — Z5112 Encounter for antineoplastic immunotherapy: Secondary | ICD-10-CM | POA: Insufficient documentation

## 2020-11-08 DIAGNOSIS — T451X5A Adverse effect of antineoplastic and immunosuppressive drugs, initial encounter: Secondary | ICD-10-CM

## 2020-11-08 DIAGNOSIS — C642 Malignant neoplasm of left kidney, except renal pelvis: Secondary | ICD-10-CM | POA: Insufficient documentation

## 2020-11-08 DIAGNOSIS — Z5111 Encounter for antineoplastic chemotherapy: Secondary | ICD-10-CM

## 2020-11-08 DIAGNOSIS — D701 Agranulocytosis secondary to cancer chemotherapy: Secondary | ICD-10-CM

## 2020-11-08 LAB — COMPREHENSIVE METABOLIC PANEL
ALT: 9 U/L (ref 0–44)
AST: 16 U/L (ref 15–41)
Albumin: 3.7 g/dL (ref 3.5–5.0)
Alkaline Phosphatase: 95 U/L (ref 38–126)
Anion gap: 11 (ref 5–15)
BUN: 13 mg/dL (ref 8–23)
CO2: 25 mmol/L (ref 22–32)
Calcium: 9.4 mg/dL (ref 8.9–10.3)
Chloride: 101 mmol/L (ref 98–111)
Creatinine, Ser: 0.76 mg/dL (ref 0.61–1.24)
GFR, Estimated: >60 mL/min (ref >60)
Glucose, Bld: 127 mg/dL — ABNORMAL HIGH (ref 70–99)
Potassium: 4.2 mmol/L (ref 3.5–5.1)
Sodium: 137 mmol/L (ref 135–145)
Total Bilirubin: 1.2 mg/dL (ref 0.3–1.2)
Total Protein: 7.3 g/dL (ref 6.5–8.1)

## 2020-11-08 LAB — CBC WITH DIFFERENTIAL/PLATELET
Abs Immature Granulocytes: 0.02 10*3/uL (ref 0.00–0.07)
Basophils Absolute: 0.1 10*3/uL (ref 0.0–0.1)
Basophils Relative: 1 %
Eosinophils Absolute: 0.4 10*3/uL (ref 0.0–0.5)
Eosinophils Relative: 5 %
HCT: 34.7 % — ABNORMAL LOW (ref 39.0–52.0)
Hemoglobin: 11.6 g/dL — ABNORMAL LOW (ref 13.0–17.0)
Immature Granulocytes: 0 %
Lymphocytes Relative: 13 %
Lymphs Abs: 1 10*3/uL (ref 0.7–4.0)
MCH: 29.1 pg (ref 26.0–34.0)
MCHC: 33.4 g/dL (ref 30.0–36.0)
MCV: 87 fL (ref 80.0–100.0)
Monocytes Absolute: 0.7 10*3/uL (ref 0.1–1.0)
Monocytes Relative: 9 %
Neutro Abs: 5.8 10*3/uL (ref 1.7–7.7)
Neutrophils Relative %: 72 %
Platelets: 219 10*3/uL (ref 150–400)
RBC: 3.99 MIL/uL — ABNORMAL LOW (ref 4.22–5.81)
RDW: 14.8 % (ref 11.5–15.5)
WBC: 7.9 10*3/uL (ref 4.0–10.5)
nRBC: 0 % (ref 0.0–0.2)

## 2020-11-08 MED ORDER — HEPARIN SOD (PORK) LOCK FLUSH 100 UNIT/ML IV SOLN
INTRAVENOUS | Status: AC
  Filled 2020-11-08: qty 5

## 2020-11-08 MED ORDER — SODIUM CHLORIDE 0.9 % IV SOLN
Freq: Once | INTRAVENOUS | Status: AC
  Filled 2020-11-08: qty 250

## 2020-11-08 MED ORDER — HEPARIN SOD (PORK) LOCK FLUSH 100 UNIT/ML IV SOLN
500.0000 [IU] | Freq: Once | INTRAVENOUS | Status: AC
  Administered 2020-11-08: 500 [IU] via INTRAVENOUS
  Filled 2020-11-08: qty 5

## 2020-11-08 MED ORDER — PALONOSETRON HCL INJECTION 0.25 MG/5ML
0.2500 mg | Freq: Once | INTRAVENOUS | Status: AC
  Administered 2020-11-08: 0.25 mg via INTRAVENOUS
  Filled 2020-11-08: qty 5

## 2020-11-08 MED ORDER — SODIUM CHLORIDE 0.9 % IV SOLN
1.0000 mg/kg | Freq: Once | INTRAVENOUS | Status: AC
  Administered 2020-11-08: 70 mg via INTRAVENOUS
  Filled 2020-11-08: qty 3

## 2020-11-08 MED ORDER — SODIUM CHLORIDE 0.9% FLUSH
10.0000 mL | Freq: Once | INTRAVENOUS | Status: AC
  Administered 2020-11-08: 10 mL via INTRAVENOUS
  Filled 2020-11-08: qty 10

## 2020-11-08 NOTE — Patient Instructions (Signed)
[facility]    Discharge Instructions:  Thank you for choosing [facility] to provide your oncology and hematology care.  If you have a lab appointment with the [facility], please go directly to the [facility] and check in at the registration area.  Wear comfortable clothing and clothing appropriate for easy access to any Portacath or PICC line.   We strive to give you quality time with your provider. You may need to reschedule your appointment if you arrive late (15 or more minutes).  Arriving late affects you and other patients whose appointments are after yours.  Also, if you miss three or more appointments without notifying the office, you may be dismissed from the clinic at the provider's discretion.      For prescription refill requests, have your pharmacy contact our office and allow 72 hours for refills to be completed.    Today you received the following chemotherapy and/or immunotherapy agents: Enfortumab Vedotin-ejfv (Padcev).      To help prevent nausea and vomiting after your treatment, we encourage you to take your nausea medication as directed.  BELOW ARE SYMPTOMS THAT SHOULD BE REPORTED IMMEDIATELY: . *FEVER GREATER THAN 100.4 F (38 C) OR HIGHER . *CHILLS OR SWEATING . *NAUSEA AND VOMITING THAT IS NOT CONTROLLED WITH YOUR NAUSEA MEDICATION . *UNUSUAL SHORTNESS OF BREATH . *UNUSUAL BRUISING OR BLEEDING . *URINARY PROBLEMS (pain or burning when urinating, or frequent urination) . *BOWEL PROBLEMS (unusual diarrhea, constipation, pain near the anus) . TENDERNESS IN MOUTH AND THROAT WITH OR WITHOUT PRESENCE OF ULCERS (sore throat, sores in mouth, or a toothache) . UNUSUAL RASH, SWELLING OR PAIN  . UNUSUAL VAGINAL DISCHARGE OR ITCHING   Items with * indicate a potential emergency and should be followed up as soon as possible or go to the Emergency Department if any problems should occur.  Please show the CHEMOTHERAPY  ALERT CARD or IMMUNOTHERAPY ALERT CARD at check-in to the Emergency Department and triage nurse.  Should you have questions after your visit or need to cancel or reschedule your appointment, please contact [facility]  [phone] and follow the prompts.  Office hours are 8:00 a.m. to 4:30 p.m. Monday - Friday. Please note that voicemails left after 4:00 p.m. may not be returned until the following business day.  We are closed weekends and major holidays. You have access to a nurse at all times for urgent questions. Please call the main number to the clinic [phone] and follow the prompts.  For any non-urgent questions, you may also contact your provider using MyChart. We now offer e-Visits for anyone 57 and older to request care online for non-urgent symptoms. For details visit [email or web address].   Also download the MyChart app! Go to the app store, search "MyChart", open the app, select , and log in with your MyChart username and password.  Due to Covid, a mask is required upon entering the hospital/clinic. If you do not have a mask, one will be given to you upon arrival. For doctor visits, patients may have 1 support person aged 28 or older with them. For treatment visits, patients cannot have anyone with them due to current Covid guidelines and our immunocompromised population.   Enfortumab vedotin injection  What is this medicine? ENFORTUMAB VEDOTIN (en FORT ue mab ve DOE tin) is a chemotherapy medicine and monoclonal antibody. It treats urothelial cancer. This medicine may be used for other purposes; ask your health care  provider or pharmacist if you have questions. COMMON BRAND NAME(S): PADCEV What should I tell my health care provider before I take this medicine? They need to know if you have any of these conditions:  diabetes (high blood sugar)  eye disease, vision problems  lung disease  skin conditions or  sensitivity  tingling of the fingers or toes, or other nerve disorder  an unusual or allergic reaction to enfortumab vedotin, other medicines, foods, dyes or preservatives  pregnant or trying to get pregnant  breast-feeding How should I use this medicine? This medicine is injected into a vein. It is given by a health care provider in a hospital or clinic setting. Talk to your health care provider about the use of this medicine in children. Special care may be needed. Overdosage: If you think you have taken too much of this medicine contact a poison control center or emergency room at once. NOTE: This medicine is only for you. Do not share this medicine with others. What if I miss a dose? Keep appointments for follow-up doses. It is important not to miss your dose. Call your doctor or health care provider if you are unable to keep an appointment. What may interact with this medicine? This medicine may also interact with the following medications:  certain antivirals for HIV  certain medicines for fungal infections like ketoconazole, itraconazole, or posaconazole  clarithromycin  grapefruit juice  mifepristone  telithromycin This list may not describe all possible interactions. Give your health care provider a list of all the medicines, herbs, non-prescription drugs, or dietary supplements you use. Also tell them if you smoke, drink alcohol, or use illegal drugs. Some items may interact with your medicine. What should I watch for while using this medicine? This medicine may make you feel generally unwell. This is not uncommon as chemotherapy can affect healthy cells as well as cancer cells. Report any side effects. Continue your course of treatment even though you feel ill unless your health care provider tells you to stop. Your condition will be monitored carefully while you are receiving this medicine. Do not become pregnant while taking this medicine or for 2 months after stopping  it. Women should inform their health care provider if they wish to become pregnant or think they might be pregnant. Men should not father a child while taking this medicine and for 4 months after stopping it. There is potential for serious side effects to an unborn child. Talk to your health care provider for more information. Do not breast-feed an infant while taking this medicine or for at least 3 weeks after stopping it. This medicine may make it more difficult to father a child. Talk to your health care provider if you are concerned about your fertility. This medicine may increase blood sugar. Ask your health care provider if changes in diet or medicines are needed if you have diabetes. This medicine can cause a serious condition in which there is too much acid in your blood. If you develop nausea, vomiting, stomach pain, unusual tiredness, or breathing problems, stop taking this medicine and call your health care provider right away. If possible, use a ketone dipstick to check for ketones in your urine. This medicine may cause dry eyes and blurred vision. If you wear contact lenses, you may feel some discomfort. Lubricating eye drops may help. See your health care provider if the problem does not go away or is severe. Tell your health care provider right away if you have any change  in your eyesight. This medicine may increase your risk of getting an infection. Call your health care provider for advice if you get a fever, chills, or sore throat, or other symptoms of a cold or flu. Do not treat yourself. Try to avoid being around people who are sick. This medicine may cause serious skin reactions. They can happen weeks to months after starting the medicine. Contact your health care provider right away if you notice fevers or flu-like symptoms with a rash. The rash may be red or purple and then turn into blisters or peeling of the skin. Or, you might notice a red rash with swelling of the face, lips or  lymph nodes in your neck or under your arms. What side effects may I notice from receiving this medicine? Side effects that you should report to your doctor or health care professional as soon as possible:  allergic reactions (skin rash, itching or hives; swelling of the face, lips, or tongue)  blurred vision  changes in vision  cough  dry eyes  high blood sugar (increased hunger, thirst or urination; unusually weak or tired, blurry vision)  infection (fever, chills, cough, sore throat, pain or trouble passing urine)  pain, tingling, numbness in the hands or feet  redness, blistering, peeling, bleeding, swelling, or loosening of the skin on the palms of your hands or soles of your feet or inside the mouth  trouble breathing Side effects that usually do not require medical attention (report these to your doctor or health care professional if they continue or are bothersome):  changes in taste  diarrhea  dry skin  hair loss  loss of appetite  nausea, vomiting  weak or tired This list may not describe all possible side effects. Call your doctor for medical advice about side effects. You may report side effects to FDA at 1-800-FDA-1088. Where should I keep my medicine? This medicine is given in a hospital or clinic and will not be stored at home. NOTE: This sheet is a summary. It may not cover all possible information. If you have questions about this medicine, talk to your doctor, pharmacist, or health care provider.  2021 [employer] (2020-01-20 11:38:36)

## 2020-11-08 NOTE — Progress Notes (Signed)
He is not eatting as much, taste alteration with food. Feet still with neuropathy.

## 2020-11-08 NOTE — Progress Notes (Signed)
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 [name] so far for his metastatic urothelial carcinoma. This was originally diagnosed in May 2019. He was noted to have a filling defect in the lower pole collecting system of the left kidney along with para-aortic and retroperitoneal adenopathy concerning for metastatic disease. He underwent left ureteroscopy and renal pelvis biopsy which revealed small fragments of high-grade urothelial carcinoma with small focus of invasion. He was started on carboplatin and gemcitabine chemotherapy in June 2019 and was continued on 05/27/2018 for 8 cycles. He tolerated chemotherapy well except for chemo-induced anemia for which she has been getting Procrit every 2 weeks. He did have response to his disease based on scans in August 2019. However repeat scan on 05/31/2018 showed increase in the size of the primary tumor from 1.2 to 1.9 cm and increase in left para-aortic adenopathy from 0.9 to 1.3 cm. Periportal adenopathy was stable at 1.4 cm. Second line immunotherapy was recommended. His initial biopsy specimen did not have enough sample to undergo FGFR mutation testing. He also has mild interstitial lung disease for which he sees  pulmonary but he is not on home oxygen. He has B12 deficiency for which he is on oral B12. Also has diabetes and coronary artery disease.Tecentriq started on 06/17/2018.Patient noted to have disease progression in his lymph nodes in May 2020. Repeat biopsy showed metastatic urothelial carcinoma which did not have aFGFR mutation. He has been started on third line Padcev  Treatment on hold since April 2021 due to neuropathy and to maintain quality of lifebut restarted in August 2021after he was found to have progression of diseasewhich he has 1 week on 3 weeks off   Interval history-patient is here with his wife today.  He continues to sleep during most of the day and wakes up late in the evening to spend awake most of his night.  Patient's wife thinks that he does not keep up with his nutritional intake and often reports fatigue.  ECOG PS- 2 Pain scale- 0  Review of systems- Review of Systems  Constitutional: Positive for malaise/fatigue. Negative for chills, fever and weight loss.  HENT: Negative for congestion, ear discharge and nosebleeds.   Eyes: Negative for blurred vision.  Respiratory: Negative for cough, hemoptysis, sputum production, shortness of breath and wheezing.   Cardiovascular: Negative for chest pain, palpitations, orthopnea and claudication.  Gastrointestinal: Negative for abdominal pain, blood in stool, constipation, diarrhea, heartburn, melena, nausea and vomiting.  Genitourinary: Negative for dysuria, flank pain, frequency, hematuria and urgency.  Musculoskeletal: Negative for back pain, joint pain and myalgias.  Skin: Negative for rash.  Neurological: Positive for sensory change (Peripheral neuropathy). Negative for dizziness, tingling, focal weakness, seizures, weakness and headaches.  Endo/Heme/Allergies: Does not bruise/bleed easily.  Psychiatric/Behavioral: Negative for depression and suicidal ideas. The patient does not have insomnia.  Allergies   Allergen Reactions  . Sulfa Antibiotics Other (See Comments)    Joint pain  . Ace Inhibitors Cough  . Invokana [Canagliflozin] Other (See Comments)    Leg pain  . Penicillins Rash    Did it involve swelling of the face/tongue/throat, SOB, or low BP? No Did it involve sudden or severe rash/hives, skin peeling, or any reaction on the inside of your mouth or nose? Yes Did you need to seek medical attention at a hospital or doctor's office? Yes When did it last happen?childhood allergy If all above answers are "NO", may proceed with cephalosporin use.      Past Medical History:  Diagnosis Date  . Acid reflux   . Anxiety   . Depression   . Diabetes mellitus without complication ([place])   . Hyperlipidemia   . Hypertension   . MRSA (methicillin resistant Staphylococcus aureus) infection 1992   history  . Myocardial infarction ([place]) 1995  . Sleep apnea    CPAP  . Urothelial cancer ([place]) 11/2017   Left Urothelial mass, chemo tx's     Past Surgical History:  Procedure Laterality Date  . CATARACT EXTRACTION Left   . COLONOSCOPY  2010   [facility]  . COLONOSCOPY WITH PROPOFOL N/A 10/04/2016   Procedure: COLONOSCOPY WITH PROPOFOL;  Surgeon: [name], MD;  Location: [facility] ENDOSCOPY;  Service: Endoscopy;  Laterality: N/A;  . [place], 2000, 2001, 2014  . CYSTOSCOPY W/ RETROGRADES Bilateral 12/10/2017   Procedure: CYSTOSCOPY WITH RETROGRADE PYELOGRAM;  Surgeon: [name], MD;  Location: [facility] ORS;  Service: Urology;  Laterality: Bilateral;  . CYSTOSCOPY W/ RETROGRADES Left 12/09/2018   Procedure: CYSTOSCOPY WITH RETROGRADE PYELOGRAM;  Surgeon: [name], MD;  Location: [facility] ORS;  Service: Urology;  Laterality: Left;  . CYSTOSCOPY WITH BIOPSY Left 12/09/2018   Procedure: CYSTOSCOPY WITH URETERAL/RENAL PELVIC BIOPSY;  Surgeon: [name], MD;  Location: [facility] ORS;  Service: Urology;  Laterality: Left;  . CYSTOSCOPY WITH STENT  PLACEMENT Left 12/10/2017   Procedure: CYSTOSCOPY WITH STENT PLACEMENT;  Surgeon: [name], MD;  Location: [facility] ORS;  Service: Urology;  Laterality: Left;  . CYSTOSCOPY WITH STENT PLACEMENT Left 12/09/2018   Procedure: CYSTOSCOPY WITH STENT PLACEMENT;  Surgeon: [name], MD;  Location: [facility] ORS;  Service: Urology;  Laterality: Left;  . CYSTOSCOPY WITH URETEROSCOPY Left 12/09/2018   Procedure: CYSTOSCOPY WITH URETEROSCOPY;  Surgeon: [name], MD;  Location: [facility] ORS;  Service: Urology;  Laterality: Left;  . EYE SURGERY Bilateral    cataract  . INGUINAL HERNIA REPAIR Right 08/09/2015   Procedure: HERNIA REPAIR INGUINAL ADULT;  Surgeon: [name], MD;  Location: [facility] ORS;  Service: General;  Laterality: Right;  . NASAL SINUS SURGERY    . nuclear stress test    . PORTA CATH INSERTION N/A 12/19/2017   Procedure: PORTA CATH INSERTION;  Surgeon: [name], MD;  Location: [place] CV LAB;  Service: Cardiovascular;  Laterality: N/A;  . TOOTH EXTRACTION  05/28/2020   upper left and it was molars  . URETERAL BIOPSY Left 12/10/2017   Procedure: URETERAL & renal PELVIS BIOPSY;  Surgeon: [name], MD;  Location: [facility] ORS;  Service: Urology;  Laterality: Left;  . URETEROSCOPY Left 12/10/2017   Procedure: URETEROSCOPY;  Surgeon: [name], MD;  Location: [facility] ORS;  Service: Urology;  Laterality: Left;    Social History   Socioeconomic History  . Marital status: Married    Spouse name: [name]  .  Number of children: 1  . Years of education: Not on file  . Highest education level: Associate degree: occupational, Hotel manager, or vocational program  Occupational History  . Occupation: retired  Tobacco Use  . Smoking status: Former Smoker    Types: Cigars    Quit date: 10/09/1978    Years since quitting: 42.1  . Smokeless tobacco: Former Systems developer    Types: Chew    Quit date: 12/04/1988  . Tobacco comment: on occasion  Vaping Use  . Vaping Use: Never used  Substance  and Sexual Activity  . Alcohol use: Not Currently  . Drug use: No  . Sexual activity: Not Currently  Other Topics Concern  . Not on file  Social History Narrative  . Not on file   Social Determinants of Health   Financial Resource Strain: Low Risk   . Difficulty of Paying Living Expenses: Not hard at all  Food Insecurity: No Food Insecurity  . Worried About Charity fundraiser in the Last Year: Never true  . Ran Out of Food in the Last Year: Never true  Transportation Needs: No Transportation Needs  . Lack of Transportation (Medical): No  . Lack of Transportation (Non-Medical): No  Physical Activity: Insufficiently Active  . Days of Exercise per Week: 3 days  . Minutes of Exercise per Session: 30 min  Stress: No Stress Concern Present  . Feeling of Stress : Not at all  Social Connections: Moderately Integrated  . Frequency of Communication with Friends and Family: Three times a week  . Frequency of Social Gatherings with Friends and Family: More than three times a week  . Attends Religious Services: More than 4 times per year  . Active Member of Clubs or Organizations: No  . Attends Archivist Meetings: Never  . Marital Status: Married  Human resources officer Violence: Not At Risk  . Fear of Current or Ex-Partner: No  . Emotionally Abused: No  . Physically Abused: No  . Sexually Abused: No    Family History  Problem Relation Age of Onset  . Heart disease Mother   . Cancer Father        Lung and colon cancer  . Heart disease Father   . Emphysema Maternal Grandfather   . Tuberculosis Maternal Grandmother      Current Outpatient Medications:  .  aspirin EC 81 MG tablet, Take 81 mg by mouth every evening. , Disp: , Rfl:  .  atorvastatin (LIPITOR) 40 MG tablet, TAKE 1 TABLET BY MOUTH AT BEDTIME, Disp: 90 tablet, Rfl: 0 .  Cholecalciferol (VITAMIN D3) 25 MCG (1000 UT) CAPS, Take 1 capsule by mouth daily., Disp: , Rfl:  .  clobetasol cream (TEMOVATE) 4.62 %, Apply 1  application topically as needed (for skin rash)., Disp: 30 g, Rfl: 1 .  empagliflozin (JARDIANCE) 10 MG TABS tablet, Take 1 tablet (10 mg total) by mouth daily., Disp: 90 tablet, Rfl: 2 .  fluticasone (FLONASE) 50 MCG/ACT nasal spray, Use 2 spray(s) in each nostril once daily (Patient taking differently: Place 1 spray into both nostrils daily as needed.), Disp: 48 g, Rfl: 3 .  glucose blood (ONETOUCH ULTRA) test strip, Use as instructed, Disp: 100 each, Rfl: 12 .  hydrOXYzine (ATARAX/VISTARIL) 25 MG tablet, Take 1 tablet (25 mg total) by mouth every 8 (eight) hours as needed for itching., Disp: 30 tablet, Rfl: 3 .  Ivermectin 1 % CREA, Apply 1 application topically daily as needed (rosacea). To face, Disp: , Rfl:  .  Lancets (ONETOUCH ULTRASOFT) lancets, Use as instructed, Disp: 100 each, Rfl: 12 .  lidocaine-prilocaine (EMLA) cream, Apply 1 application topically as needed (port access)., Disp: 1 g, Rfl: 3 .  losartan (COZAAR) 50 MG tablet, Take 1 tablet by mouth once daily, Disp: 90 tablet, Rfl: 0 .  metFORMIN (GLUCOPHAGE) 1000 MG tablet, TAKE 1 TABLET BY MOUTH TWICE DAILY WITH MEALS, Disp: 60 tablet, Rfl: 0 .  nystatin cream (MYCOSTATIN), Apply topically 2 (two) times daily. (Patient taking differently: Apply 1 application topically 2 (two) times daily as needed.), Disp: 15 g, Rfl: 1 .  OVER THE COUNTER MEDICATION, Apply 1 application topically daily as needed (pain). Outback topical pain relief , Disp: , Rfl:  .  pregabalin (LYRICA) 75 MG capsule, Take 1 capsule by mouth once daily, Disp: 90 capsule, Rfl: 1 .  psyllium (METAMUCIL) 58.6 % powder, Take 1 packet by mouth daily., Disp: , Rfl:  .  sertraline (ZOLOFT) 50 MG tablet, Take 1 tablet (50 mg total) by mouth daily., Disp: 90 tablet, Rfl: 3 .  vitamin B-12 (CYANOCOBALAMIN) 1000 MCG tablet, Take by mouth daily. Unsure dose, Disp: , Rfl:  .  blood glucose meter kit and supplies, Dispense based on patient and insurance preference. Use once daily  as directed. (FOR ICD-10 E11.9)., Disp: 1 each, Rfl: 0 .  Blood Glucose Monitoring Suppl (ONE TOUCH ULTRA 2) w/Device KIT, 1 each by Does not apply route once for 1 dose. Test blood sugars once daily, Disp: 1 kit, Rfl: 0 .  magnesium hydroxide (MILK OF MAGNESIA) 400 MG/5ML suspension, Take 15 mLs by mouth daily as needed for mild constipation. (Patient not taking: Reported on 11/08/2020), Disp: , Rfl:  .  vitamin C (ASCORBIC ACID) 500 MG tablet, Take 1,000-1,500 mg by mouth 2 (two) times daily. (Patient not taking: Reported on 11/08/2020), Disp: , Rfl:  No current facility-administered medications for this visit.  Facility-Administered Medications Ordered in Other Visits:  .  Influenza vac split quadrivalent PF (FLUZONE HIGH-DOSE) injection 0.5 mL, 0.5 mL, Intramuscular, Once, Karen Kitchens, NP  Physical exam:  Vitals:   11/08/20 1420  BP: 132/82  Pulse: 79  Resp: 16  Temp: 98.1 F (36.7 C)  Weight: 157 lb (71.2 kg)  Height: $Remove'5\' 8"'mpRAHss$  (1.727 m)   Physical Exam Constitutional:      General: He is not in acute distress. Cardiovascular:     Rate and Rhythm: Normal rate and regular rhythm.     Heart sounds: Normal heart sounds.  Pulmonary:     Effort: Pulmonary effort is normal.     Breath sounds: Normal breath sounds.  Abdominal:     General: Bowel sounds are normal.     Palpations: Abdomen is soft.  Skin:    General: Skin is warm and dry.  Neurological:     Mental Status: He is alert and oriented to person, place, and time.      CMP Latest Ref Rng & Units 11/08/2020  Glucose 70 - 99 mg/dL 127(H)  BUN 8 - 23 mg/dL 13  Creatinine 0.61 - 1.24 mg/dL 0.76  Sodium 135 - 145 mmol/L 137  Potassium 3.5 - 5.1 mmol/L 4.2  Chloride 98 - 111 mmol/L 101  CO2 22 - 32 mmol/L 25  Calcium 8.9 - 10.3 mg/dL 9.4  Total Protein 6.5 - 8.1 g/dL 7.3  Total Bilirubin 0.3 - 1.2 mg/dL 1.2  Alkaline Phos 38 - 126 U/L 95  AST 15 - 41 U/L 16  ALT 0 - 44  U/L 9   CBC Latest Ref Rng & Units 11/08/2020  WBC  4.0 - 10.5 K/uL 7.9  Hemoglobin 13.0 - 17.0 g/dL 11.6(L)  Hematocrit 39.0 - 52.0 % 34.7(L)  Platelets 150 - 400 K/uL 219     Assessment and plan- Patient is a 75 y.o. 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 Patient is here for next cycle of Padcev  Patient has only been getting Padcev 1 week on 3 weeks of instead of 3 weeks on 1 week off due to concerns of peripheral neuropathy as well as quality of life and mainly fatigue. Patient had a scan back in January 2022 when he had a break from pad 7 scan showed mild increase in the soft tissue density in the left lower pole when Padcev was restarted.  Counts okay to proceed with next cycle of Padcev today and he will return in 4 weeks to be seen by covering NP for the next cycle.  I will plan to get repeat CT chest abdomen and pelvis with contrast in 6 weeks.  Fatigue: Likely secondary to inactivity and poor nutritional intake.  I have encouraged the patient to get back to his normal sleep wake cycle and work on getting some mild exercise as well as improving nutritional intake.  I will see him back in 8 weeks   Visit Diagnosis 1. Encounter for antineoplastic chemotherapy   2. Urothelial carcinoma of kidney, left (HCC)      Dr. Randa Evens, MD, MPH [facility] [phone] 11/08/2020 9:16 PM

## 2020-11-10 ENCOUNTER — Other Ambulatory Visit: Payer: Self-pay

## 2020-11-10 ENCOUNTER — Ambulatory Visit (INDEPENDENT_AMBULATORY_CARE_PROVIDER_SITE_OTHER): Payer: HMO | Admitting: Family Medicine

## 2020-11-10 ENCOUNTER — Telehealth: Payer: Self-pay | Admitting: *Deleted

## 2020-11-10 ENCOUNTER — Encounter: Payer: Self-pay | Admitting: Family Medicine

## 2020-11-10 VITALS — BP 115/77 | HR 78 | Resp 15 | Wt 158.0 lb

## 2020-11-10 DIAGNOSIS — R202 Paresthesia of skin: Secondary | ICD-10-CM

## 2020-11-10 DIAGNOSIS — E538 Deficiency of other specified B group vitamins: Secondary | ICD-10-CM

## 2020-11-10 DIAGNOSIS — I2510 Atherosclerotic heart disease of native coronary artery without angina pectoris: Secondary | ICD-10-CM

## 2020-11-10 DIAGNOSIS — E785 Hyperlipidemia, unspecified: Secondary | ICD-10-CM | POA: Diagnosis not present

## 2020-11-10 DIAGNOSIS — E119 Type 2 diabetes mellitus without complications: Secondary | ICD-10-CM | POA: Diagnosis not present

## 2020-11-10 DIAGNOSIS — I10 Essential (primary) hypertension: Secondary | ICD-10-CM

## 2020-11-10 DIAGNOSIS — C689 Malignant neoplasm of urinary organ, unspecified: Secondary | ICD-10-CM

## 2020-11-10 NOTE — Telephone Encounter (Signed)
Suanne Marker, NCM with patient insurance company called stating that they have this patient enrolled in a special program for his illnesses and wanted [place] to have her number so that we can call her if there are any needs for this patient that she can assist with [phone]

## 2020-11-10 NOTE — Telephone Encounter (Signed)
I called pt and he has spoke to her before and he states that Patrick Mcgee has both phone numbers for pt. He could not remember her number and I gave it to him and he will be calling her. She assists with him about his diabetes per pt.

## 2020-11-10 NOTE — Progress Notes (Signed)
Established patient visit   Patient: Patrick Mcgee   DOB: [birth year]   75 y.o. Male  MRN: [id number] Visit Date: 11/10/2020  Today's healthcare provider: Wilhemena Durie, MD   No chief complaint on file.  Subjective    HPI  Patient comes in today for follow-up.  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  He states he does not go to bed most nights until 5 or 6 AM. It is time for routine lab work following up his diabetes lipids blood pressure thyroid. Diabetes Mellitus Type II, follow-up  Lab Results  Component Value Date   HGBA1C 7.1 (A) 11/11/2020   HGBA1C 7.9 (H) 12/25/2019   HGBA1C 8.2 (A) 08/13/2019   Last seen for diabetes 6 months ago.  Management since then includes continuing the same treatment. He reports excellent compliance with treatment. He is not having side effects.    Home blood sugar records: fasting range: 90-130  Episodes of hypoglycemia? No     Current insulin regiment: none Most Recent Eye Exam: 09/29/20  --------------------------------------------------------------------------------------------------- Hypertension, follow-up  BP Readings from Last 3 Encounters:  11/10/20 115/77  11/08/20 132/82  10/11/20 108/79   Wt Readings from Last 3 Encounters:  11/10/20 158 lb (71.7 kg)  11/08/20 157 lb (71.2 kg)  10/11/20 157 lb 14.4 oz (71.6 kg)     He was last seen for hypertension 6 months ago.  BP at that visit was 91/60. Management since that visit includes no medication changes. He reports excellent compliance with treatment. He is not having side effects.  He is exercising. He is adherent to low salt diet.   Outside blood pressures are not being checked.  He does not smoke.  Use of agents associated with hypertension: NSAIDS.    --------------------------------------------------------------------------------------------------- Lipid/Cholesterol, follow-up  Last Lipid Panel: Lab Results  Component Value Date   CHOL 120 12/25/2019   LDLCALC 54 12/25/2019   HDL 51 12/25/2019   TRIG 73 12/25/2019    He was last seen for this 6 months ago.  Management since that visit includes none.  He reports excellent compliance with treatment. He is not having side effects.   Symptoms: No appetite changes No foot ulcerations  No chest pain No chest pressure/discomfort  No dyspnea No orthopnea  No fatigue No lower extremity edema  No palpitations No paroxysmal nocturnal dyspnea  No nausea Yes numbness or tingling of extremity  Yes polydipsia No polyuria  No speech difficulty No syncope   He is following a Regular diet. Current exercise: walking  Last metabolic panel Lab Results  Component Value Date   GLUCOSE 127 (H) 11/08/2020   NA 137 11/08/2020   K 4.2 11/08/2020   BUN 13 11/08/2020   CREATININE 0.76 11/08/2020   GFRNONAA >60 11/08/2020   GFRAA >60 04/06/2020   CALCIUM 9.4 11/08/2020   AST 16 11/08/2020   ALT 9 11/08/2020   The ASCVD Risk score Mikey Bussing DC Jr., et al., 2013) failed to calculate for the following reasons:   The patient has a prior MI or stroke diagnosis  Patient Active Problem List   Diagnosis Date Noted  . Chest pain 03/04/2019  . Anemia due to antineoplastic chemotherapy 03/03/2018  . Cough 03/03/2018  . Chemotherapy induced neutropenia ([place]) 01/22/2018  . B12 deficiency  01/01/2018  . Hypomagnesemia 12/21/2017  . Encounter for antineoplastic chemotherapy 12/21/2017  . Malignant neoplasm of kidney ([place])   . Urothelial cancer ([place]) 12/18/2017  . Malnutrition of moderate degree 12/18/2017  . Therapeutic opioid induced constipation   . Palliative care encounter   . Dehydration 12/17/2017  . Goals of care, counseling/discussion 12/13/2017  . Urothelial carcinoma of kidney, left  ([place]) 12/07/2017  . Right inguinal hernia 07/17/2015  . Family history of colon cancer 07/17/2015  . Allergic rhinitis 11/12/2014  . Airway hyperreactivity 11/12/2014  . Atherosclerosis of coronary artery 11/12/2014  . Cheilitis 11/12/2014  . Narrowing of intervertebral disc space 11/12/2014  . Deflected nasal septum 11/12/2014  . HLD (hyperlipidemia) 11/12/2014  . BP (high blood pressure) 11/12/2014  . Adult hypothyroidism 11/12/2014  . Sleep apnea 11/12/2014  . Diabetes mellitus, type 2 ([place]) 11/12/2014  . Degenerative disc disease, lumbar 09/24/2012  . Furunculosis 04/23/2012   Past Medical History:  Diagnosis Date  . Acid reflux   . Anxiety   . Depression   . Diabetes mellitus without complication ([place])   . Hyperlipidemia   . Hypertension   . MRSA (methicillin resistant Staphylococcus aureus) infection 1992   history  . Myocardial infarction ([place]) 1995  . Sleep apnea    CPAP  . Urothelial cancer ([place]) 11/2017   Left Urothelial mass, chemo tx's   Allergies  Allergen Reactions  . Sulfa Antibiotics Other (See Comments)    Joint pain  . Ace Inhibitors Cough  . Invokana [Canagliflozin] Other (See Comments)    Leg pain  . Penicillins Rash    Did it involve swelling of the face/tongue/throat, SOB, or low BP? No Did it involve sudden or severe rash/hives, skin peeling, or any reaction on the inside of your mouth or nose? Yes Did you need to seek medical attention at a hospital or doctor's office? Yes When did it last happen?childhood allergy If all above answers are "NO", may proceed with cephalosporin use.        Medications: Outpatient Medications Prior to Visit  Medication Sig  . aspirin EC 81 MG tablet Take 81 mg by mouth every evening.   Marland Kitchen atorvastatin (LIPITOR) 40 MG tablet TAKE 1 TABLET BY MOUTH AT BEDTIME  . blood glucose meter kit and supplies Dispense based on patient and insurance preference. Use once daily as directed. (FOR ICD-10 E11.9).  Marland Kitchen Blood  Glucose Monitoring Suppl (ONE TOUCH ULTRA 2) w/Device KIT 1 each by Does not apply route once for 1 dose. Test blood sugars once daily  . Cholecalciferol (VITAMIN D3) 25 MCG (1000 UT) CAPS Take 1 capsule by mouth daily.  . clobetasol cream (TEMOVATE) 5.99 % Apply 1 application topically as needed (for skin rash).  . empagliflozin (JARDIANCE) 10 MG TABS tablet Take 1 tablet (10 mg total) by mouth daily.  . fluticasone (FLONASE) 50 MCG/ACT nasal spray Use 2 spray(s) in each nostril once daily (Patient taking differently: Place 1 spray into both nostrils daily as needed.)  . glucose blood (ONETOUCH ULTRA) test strip Use as instructed  . hydrOXYzine (ATARAX/VISTARIL) 25 MG tablet Take 1 tablet (25 mg total) by mouth every 8 (eight) hours as needed for itching.  . Ivermectin 1 % CREA Apply 1 application topically daily as needed (rosacea). To face  . Lancets (ONETOUCH ULTRASOFT) lancets Use as instructed  . lidocaine-prilocaine (EMLA) cream Apply 1 application topically as needed (port access).  Marland Kitchen losartan (COZAAR) 50 MG tablet Take 1 tablet by mouth once daily  .  magnesium hydroxide (MILK OF MAGNESIA) 400 MG/5ML suspension Take 15 mLs by mouth daily as needed for mild constipation. (Patient not taking: Reported on 11/08/2020)  . metFORMIN (GLUCOPHAGE) 1000 MG tablet TAKE 1 TABLET BY MOUTH TWICE DAILY WITH MEALS  . nystatin cream (MYCOSTATIN) Apply topically 2 (two) times daily. (Patient taking differently: Apply 1 application topically 2 (two) times daily as needed.)  . OVER THE COUNTER MEDICATION Apply 1 application topically daily as needed (pain). Outback topical pain relief   . pregabalin (LYRICA) 75 MG capsule Take 1 capsule by mouth once daily  . psyllium (METAMUCIL) 58.6 % powder Take 1 packet by mouth daily.  . sertraline (ZOLOFT) 50 MG tablet Take 1 tablet (50 mg total) by mouth daily.  . vitamin B-12 (CYANOCOBALAMIN) 1000 MCG tablet Take by mouth daily. Unsure dose  . vitamin C (ASCORBIC ACID)  500 MG tablet Take 1,000-1,500 mg by mouth 2 (two) times daily. (Patient not taking: Reported on 11/08/2020)   Facility-Administered Medications Prior to Visit  Medication Dose Route Frequency Provider  . Influenza vac split quadrivalent PF (FLUZONE HIGH-DOSE) injection 0.5 mL  0.5 mL Intramuscular Once Karen Kitchens, NP    Review of Systems  Constitutional: Negative for activity change and fatigue.  Respiratory: Negative for cough and shortness of breath.   Cardiovascular: Negative for chest pain, palpitations and leg swelling.  Endocrine: Negative for cold intolerance, heat intolerance, polydipsia, polyphagia and polyuria.  Neurological: Negative for dizziness, light-headedness and headaches.  Psychiatric/Behavioral: Negative for self-injury, sleep disturbance and suicidal ideas. The patient is not nervous/anxious.         Objective    BP 115/77   Pulse 78   Resp 15   Wt 158 lb (71.7 kg)   SpO2 97%   BMI 24.02 kg/m  BP Readings from Last 3 Encounters:  11/10/20 115/77  11/08/20 132/82  10/11/20 108/79   Wt Readings from Last 3 Encounters:  11/10/20 158 lb (71.7 kg)  11/08/20 157 lb (71.2 kg)  10/11/20 157 lb 14.4 oz (71.6 kg)       Physical Exam    Results for orders placed or performed in visit on 11/10/20  POCT glycosylated hemoglobin (Hb A1C)  Result Value Ref Range   Hemoglobin A1C 7.1 (A) 4.0 - 5.6 %   Est. average glucose Bld gHb Est-mCnc 157     Assessment & Plan     1. Type 2 diabetes mellitus without complication, without long-term current use of insulin (HCC) A1c under good control at 7.1.  Continue metformin and Jardiance - POCT glycosylated hemoglobin (Hb A1C) - Comprehensive metabolic panel  2. B12 deficiency Check B12 level - B12 and Folate Panel  3. 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 Hyperlipidemia, unspecified hyperlipidemia type On atorvastatin - Lipid panel  5. Urothelial cancer  [facility] [place]) Chemotherapy per oncology - CBC with Differential/Platelet  6. Essential hypertension On losartan - TSH  7. Atherosclerosis of native coronary artery of native heart without angina pectoris All risk factors treated - CBC with Differential/Platelet   No follow-ups on file.      I, April Miller, CMA, have reviewed all documentation for this visit. The documentation on 11/11/20 for the exam, diagnosis, procedures, and orders are all accurate and complete.    Daiki Dicostanzo Cranford Mon, MD  [facility] [phone] (phone) [phone] (fax)  [place]

## 2020-11-11 LAB — POCT GLYCOSYLATED HEMOGLOBIN (HGB A1C)
Est. average glucose Bld gHb Est-mCnc: 157
Hemoglobin A1C: 7.1 % — AB (ref 4.0–5.6)

## 2020-11-15 ENCOUNTER — Other Ambulatory Visit: Payer: Self-pay | Admitting: Family Medicine

## 2020-11-15 NOTE — Telephone Encounter (Signed)
Requested Prescriptions  Pending Prescriptions Disp Refills  . metFORMIN (GLUCOPHAGE) 1000 MG tablet [Pharmacy Med Name: metFORMIN HCl 1000 MG Oral Tablet] 60 tablet 2    Sig: TAKE 1 TABLET BY MOUTH TWICE DAILY WITH MEALS     Endocrinology:  Diabetes - Biguanides Passed - 11/15/2020  3:48 PM      Passed - Cr in normal range and within 360 days    Creatinine  Date Value Ref Range Status  02/03/2013 0.69 0.60 - 1.30 mg/dL Final   Creatinine, Ser  Date Value Ref Range Status  11/08/2020 0.76 0.61 - 1.24 mg/dL Final         Passed - HBA1C is between 0 and 7.9 and within 180 days    Hemoglobin A1C  Date Value Ref Range Status  11/11/2020 7.1 (A) 4.0 - 5.6 % Final   Hgb A1c MFr Bld  Date Value Ref Range Status  12/25/2019 7.9 (H) 4.8 - 5.6 % Final    Comment:             Prediabetes: 5.7 - 6.4          Diabetes: >6.4          Glycemic control for adults with diabetes: <7.0          Passed - eGFR in normal range and within 360 days    EGFR (African American)  Date Value Ref Range Status  02/03/2013 >60  Final   GFR calc Af Amer  Date Value Ref Range Status  04/06/2020 >60 >60 mL/min Final   EGFR (Non-African Amer.)  Date Value Ref Range Status  02/03/2013 >60  Final    Comment:    eGFR values <36mL/min/1.73 m2 may be an indication of chronic kidney disease (CKD). Calculated eGFR is useful in patients with stable renal function. The eGFR calculation will not be reliable in acutely ill patients when serum creatinine is changing rapidly. It is not useful in  patients on dialysis. The eGFR calculation may not be applicable to patients at the low and high extremes of body sizes, pregnant women, and vegetarians.    GFR, Estimated  Date Value Ref Range Status  11/08/2020 >60 >60 mL/min Final    Comment:    (NOTE) Calculated using the CKD-EPI Creatinine Equation (2021)          Passed - Valid encounter within last 6 months    Recent Outpatient Visits          5 days  ago Type 2 diabetes mellitus without complication, without long-term current use of insulin [facility])   [facility] Jerrol Banana., MD   7 months ago Medicare annual wellness visit, subsequent   [facility] Jerrol Banana., MD   11 months ago Type 2 diabetes mellitus without complication, without long-term current use of insulin [facility])   [facility] Jerrol Banana., MD   1 year ago Type 2 diabetes mellitus without complication, without long-term current use of insulin [facility])   [facility] Jerrol Banana., MD   1 year ago Essential hypertension   [facility] Jerrol Banana., MD      Future Appointments            In 1 week Borders, Kirt Boys, NP [facility]   In 4 months Jerrol Banana., MD [facility] [place]

## 2020-11-17 ENCOUNTER — Telehealth: Payer: Self-pay

## 2020-11-17 NOTE — Telephone Encounter (Signed)
Copied from [place] [id number]. Topic: General - Other >> Nov 10, 2020  9:08 AM Jodie Echevaria wrote: Reason for CRM: Suanne Marker nurse RN and Nurse care manager with [facility] insurance called in to leave her information for Dr Rosanna Randy. Per Ms Suanne Marker she works closely with patient if there are any questions and or concerns please call Suanne Marker at Ph# [phone]

## 2020-11-19 ENCOUNTER — Telehealth: Payer: Self-pay | Admitting: *Deleted

## 2020-11-19 NOTE — Telephone Encounter (Signed)
Heather form Dr Juleen China, DDS called asking for clearance for an emergency extraction for this patient who is in their chair right now I also asked that she fax request over Please call her ASAP [phone]. I called the dental office and per dr Janese Banks she said it is fine for pt to have the dental procedure done. I spoke to [place] at dental office

## 2020-11-22 ENCOUNTER — Inpatient Hospital Stay (HOSPITAL_BASED_OUTPATIENT_CLINIC_OR_DEPARTMENT_OTHER): Payer: HMO | Admitting: Hospice and Palliative Medicine

## 2020-11-22 DIAGNOSIS — Z515 Encounter for palliative care: Secondary | ICD-10-CM

## 2020-11-22 DIAGNOSIS — C642 Malignant neoplasm of left kidney, except renal pelvis: Secondary | ICD-10-CM | POA: Diagnosis not present

## 2020-11-22 NOTE — Progress Notes (Signed)
Virtual Visit via Video Note  I connected with Patrick Mcgee on 11/22/20 at 11:30 AM EDT by a video enabled telemedicine application and verified that I am speaking with the correct person using two identifiers.   I discussed the limitations of evaluation and management by telemedicine and the availability of in person appointments. The patient expressed understanding and agreed to proceed.  History of Present Illness: [name] is a 75 y.o.malewith multiple medical problems including metastatic urothelial carcinoma originally diagnosed in May 2019 status post chemotherapy and immunotherapy. PMH is also notable for mild interstitial lung disease, diabetes, and CAD status post previous stenting.  Patient has been on maintenance Enfortumab. Patient was referred to palliative care to help address goals and manage ongoing symptoms.   Observations/Objective: Patient reports that overall he is doing well.    He says that his oral intake is minimal.  Often he only eats a meal a day.  He is trying to drink more oral supplements.  He has a preference for sweets and we discussed use of milkshakes out of the supplements.  Diabetes has been well controlled.  He is trying to get his sleep pattern corrected but still takes frequent daytime naps.  We discussed use of sleep medications at bedtime but he was not interested in this.  Patient endorses several recent falls.  He says that he felt stronger when he was receiving physical therapy.  He is interested in referral back to physical therapy if possible.  Assessment and Plan: Stage IV urothelial cancer -on treatment with enfortumab. Followed by Dr. Eldridge Abrahams -referral to PT and [facility]  Case and plan discussed with Dr. Janese Banks  Follow Up Instructions: Follow-up MyChart visit in 1 to 2 months   I discussed the assessment and treatment plan with the patient. The patient was provided an opportunity to ask questions and all were answered. The  patient agreed with the plan and demonstrated an understanding of the instructions.   The patient was advised to call back or seek an in-person evaluation if the symptoms worsen or if the condition fails to improve as anticipated.  I provided 15 minutes of non-face-to-face time during this encounter.   Irean Hong, NP

## 2020-12-06 ENCOUNTER — Other Ambulatory Visit: Payer: Self-pay | Admitting: Family Medicine

## 2020-12-06 ENCOUNTER — Other Ambulatory Visit: Payer: Self-pay | Admitting: *Deleted

## 2020-12-06 DIAGNOSIS — C642 Malignant neoplasm of left kidney, except renal pelvis: Secondary | ICD-10-CM

## 2020-12-07 ENCOUNTER — Inpatient Hospital Stay: Payer: HMO | Admitting: Oncology

## 2020-12-07 ENCOUNTER — Inpatient Hospital Stay: Payer: HMO

## 2020-12-07 ENCOUNTER — Other Ambulatory Visit: Payer: Self-pay

## 2020-12-07 VITALS — BP 136/76 | HR 85 | Temp 96.8°F | Resp 18 | Wt 154.7 lb

## 2020-12-07 DIAGNOSIS — C642 Malignant neoplasm of left kidney, except renal pelvis: Secondary | ICD-10-CM | POA: Diagnosis not present

## 2020-12-07 DIAGNOSIS — Z5112 Encounter for antineoplastic immunotherapy: Secondary | ICD-10-CM | POA: Diagnosis not present

## 2020-12-07 DIAGNOSIS — T451X5A Adverse effect of antineoplastic and immunosuppressive drugs, initial encounter: Secondary | ICD-10-CM

## 2020-12-07 LAB — COMPREHENSIVE METABOLIC PANEL
ALT: 8 U/L (ref 0–44)
AST: 13 U/L — ABNORMAL LOW (ref 15–41)
Albumin: 3.6 g/dL (ref 3.5–5.0)
Alkaline Phosphatase: 100 U/L (ref 38–126)
Anion gap: 12 (ref 5–15)
BUN: 24 mg/dL — ABNORMAL HIGH (ref 8–23)
CO2: 23 mmol/L (ref 22–32)
Calcium: 9.4 mg/dL (ref 8.9–10.3)
Chloride: 101 mmol/L (ref 98–111)
Creatinine, Ser: 0.88 mg/dL (ref 0.61–1.24)
GFR, Estimated: >60 mL/min (ref >60)
Glucose, Bld: 121 mg/dL — ABNORMAL HIGH (ref 70–99)
Potassium: 4.2 mmol/L (ref 3.5–5.1)
Sodium: 136 mmol/L (ref 135–145)
Total Bilirubin: 1.1 mg/dL (ref 0.3–1.2)
Total Protein: 7.3 g/dL (ref 6.5–8.1)

## 2020-12-07 LAB — CBC WITH DIFFERENTIAL/PLATELET
Abs Immature Granulocytes: 0.03 10*3/uL (ref 0.00–0.07)
Basophils Absolute: 0.1 10*3/uL (ref 0.0–0.1)
Basophils Relative: 1 %
Eosinophils Absolute: 0.2 10*3/uL (ref 0.0–0.5)
Eosinophils Relative: 3 %
HCT: 34.6 % — ABNORMAL LOW (ref 39.0–52.0)
Hemoglobin: 11.5 g/dL — ABNORMAL LOW (ref 13.0–17.0)
Immature Granulocytes: 0 %
Lymphocytes Relative: 12 %
Lymphs Abs: 1 10*3/uL (ref 0.7–4.0)
MCH: 28.5 pg (ref 26.0–34.0)
MCHC: 33.2 g/dL (ref 30.0–36.0)
MCV: 85.6 fL (ref 80.0–100.0)
Monocytes Absolute: 0.7 10*3/uL (ref 0.1–1.0)
Monocytes Relative: 8 %
Neutro Abs: 6.6 10*3/uL (ref 1.7–7.7)
Neutrophils Relative %: 76 %
Platelets: 226 10*3/uL (ref 150–400)
RBC: 4.04 MIL/uL — ABNORMAL LOW (ref 4.22–5.81)
RDW: 14.6 % (ref 11.5–15.5)
WBC: 8.6 10*3/uL (ref 4.0–10.5)
nRBC: 0 % (ref 0.0–0.2)

## 2020-12-07 MED ORDER — PALONOSETRON HCL INJECTION 0.25 MG/5ML
0.2500 mg | Freq: Once | INTRAVENOUS | Status: AC
  Administered 2020-12-07: 0.25 mg via INTRAVENOUS
  Filled 2020-12-07: qty 5

## 2020-12-07 MED ORDER — HEPARIN SOD (PORK) LOCK FLUSH 100 UNIT/ML IV SOLN
500.0000 [IU] | Freq: Once | INTRAVENOUS | Status: DC
  Administered 2020-12-07: 500 [IU] via INTRAVENOUS
  Filled 2020-12-07: qty 5

## 2020-12-07 MED ORDER — SODIUM CHLORIDE 0.9 % IV SOLN
1.0000 mg/kg | Freq: Once | INTRAVENOUS | Status: AC
  Administered 2020-12-07: 70 mg via INTRAVENOUS
  Filled 2020-12-07: qty 4

## 2020-12-07 MED ORDER — SODIUM CHLORIDE 0.9 % IV SOLN
Freq: Once | INTRAVENOUS | Status: AC
  Filled 2020-12-07: qty 250

## 2020-12-07 MED ORDER — SODIUM CHLORIDE 0.9% FLUSH
10.0000 mL | INTRAVENOUS | Status: DC | PRN
  Administered 2020-12-07: 10 mL via INTRAVENOUS
  Filled 2020-12-07: qty 10

## 2020-12-07 NOTE — Patient Instructions (Signed)
[facility] [place] [facility]   Discharge Instructions: Thank you for choosing [facility] to provide your oncology and hematology care.  If you have a lab appointment with the Cancer Center, please go directly to the Cancer Center and check in at the registration area.  Wear comfortable clothing and clothing appropriate for easy access to any Portacath or PICC line.   We strive to give you quality time with your provider. You may need to reschedule your appointment if you arrive late (15 or more minutes).  Arriving late affects you and other patients whose appointments are after yours.  Also, if you miss three or more appointments without notifying the office, you may be dismissed from the clinic at the provider's discretion.      For prescription refill requests, have your pharmacy contact our office and allow 72 hours for refills to be completed.    Today you received the following chemotherapy and/or immunotherapy agents: Padcev.      To help prevent nausea and vomiting after your treatment, we encourage you to take your nausea medication as directed.  BELOW ARE SYMPTOMS THAT SHOULD BE REPORTED IMMEDIATELY: *FEVER GREATER THAN 100.4 F (38 C) OR HIGHER *CHILLS OR SWEATING *NAUSEA AND VOMITING THAT IS NOT CONTROLLED WITH YOUR NAUSEA MEDICATION *UNUSUAL SHORTNESS OF BREATH *UNUSUAL BRUISING OR BLEEDING *URINARY PROBLEMS (pain or burning when urinating, or frequent urination) *BOWEL PROBLEMS (unusual diarrhea, constipation, pain near the anus) TENDERNESS IN MOUTH AND THROAT WITH OR WITHOUT PRESENCE OF ULCERS (sore throat, sores in mouth, or a toothache) UNUSUAL RASH, SWELLING OR PAIN  UNUSUAL VAGINAL DISCHARGE OR ITCHING   Items with * indicate a potential emergency and should be followed up as soon as possible or go to the Emergency Department if any problems should occur.  Please show the CHEMOTHERAPY ALERT CARD or IMMUNOTHERAPY ALERT CARD at check-in  to the Emergency Department and triage nurse.  Should you have questions after your visit or need to cancel or reschedule your appointment, please contact [facility] C[facility] [place] [facility]  [phone] and follow the prompts.  Office hours are 8:00 a.m. to 4:30 p.m. Monday - Friday. Please note that voicemails left after 4:00 p.m. may not be returned until the following business day.  We are closed weekends and major holidays. You have access to a nurse at all times for urgent questions. Please call the main number to the clinic [phone] and follow the prompts.  For any non-urgent questions, you may also contact your provider using MyChart. We now offer e-Visits for anyone 18 and older to request care online for non-urgent symptoms. For details visit [email or web address].   Also download the MyChart app! Go to the app store, search "MyChart", open the app, select [facility], and log in with your MyChart username and password.  Due to Covid, a mask is required upon entering the hospital/clinic. If you do not have a mask, one will be given to you upon arrival. For doctor visits, patients may have 1 support person aged 18 or older with them. For treatment visits, patients cannot have anyone with them due to current Covid guidelines and our immunocompromised population.  

## 2020-12-07 NOTE — Progress Notes (Signed)
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 [name] so far for his metastatic urothelial carcinoma. This was originally diagnosed in May 2019. He was noted to have a filling defect in the lower pole collecting system of the left kidney along with para-aortic and retroperitoneal adenopathy concerning for metastatic disease. He underwent left ureteroscopy and renal pelvis biopsy which revealed small fragments of high-grade urothelial carcinoma with small focus of invasion. He was started on carboplatin and gemcitabine chemotherapy in June 2019 and was continued on 05/27/2018 for 8 cycles. He tolerated chemotherapy well except for chemo-induced anemia for which she has been getting Procrit every 2 weeks. He did have response to his disease based on scans in August 2019. However repeat scan on 05/31/2018 showed increase in the size of the primary tumor from 1.2 to 1.9 cm and increase in left para-aortic adenopathy from 0.9 to 1.3 cm. Periportal adenopathy was stable at 1.4 cm. Second line immunotherapy was recommended. His initial biopsy specimen did not have enough sample to undergo FGFR mutation testing. He also has mild interstitial lung disease for which he sees  pulmonary but he is not on home oxygen. He has B12 deficiency for which he is on oral B12. Also has diabetes and coronary artery disease.Tecentriq started on 06/17/2018.Patient noted to have disease progression in his lymph nodes in May 2020. Repeat biopsy showed metastatic urothelial carcinoma which did not have aFGFR mutation. He has been started on third line Padcev  Treatment on hold since April 2021 due to neuropathy and to maintain quality of lifebut restarted in August 2021after he was found to have progression of diseasewhich he has 1 week on 3 weeks off  Interval history-patient is here with his wife today.  He continues to sleep on and off throughout the day and is up most of the night.  He is eating snack foods rather than meals.  He has been trying to incorporate more protein rich foods but does not have a craving for them.  Reports fatigue and generalized malaise.  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  Currently on Lyrica and is stable.  He uses a cream he got off of [employer] topically that seems to help him.   ECOG PS- 2 Pain scale- 0  Review of systems- Review of Systems  Constitutional: Positive for malaise/fatigue. Negative for chills, fever and weight loss.  HENT: Negative for congestion, ear discharge and nosebleeds.   Eyes: Negative for blurred vision.  Respiratory: Negative for cough, hemoptysis, sputum production, shortness of breath and wheezing.   Cardiovascular: Negative for chest pain, palpitations, orthopnea and claudication.  Gastrointestinal: Negative for abdominal pain, blood in stool, constipation, diarrhea, heartburn, melena, nausea and vomiting.  Genitourinary: Negative for dysuria, flank pain, frequency, hematuria and urgency.  Musculoskeletal: Negative for back pain, joint pain and myalgias.  Skin:  Negative for rash.  Neurological: Positive for sensory change (Peripheral neuropathy). Negative for dizziness,  tingling, focal weakness, seizures, weakness and headaches.  Endo/Heme/Allergies: Does not bruise/bleed easily.  Psychiatric/Behavioral: Negative for depression and suicidal ideas. The patient does not have insomnia.       Allergies  Allergen Reactions  . Sulfa Antibiotics Other (See Comments)    Joint pain  . Ace Inhibitors Cough  . Invokana [Canagliflozin] Other (See Comments)    Leg pain  . Penicillins Rash    Did it involve swelling of the face/tongue/throat, SOB, or low BP? No Did it involve sudden or severe rash/hives, skin peeling, or any reaction on the inside of your mouth or nose? Yes Did you need to seek medical attention at a hospital or doctor's office? Yes When did it last happen?childhood allergy If all above answers are "NO", may proceed with cephalosporin use.      Past Medical History:  Diagnosis Date  . Acid reflux   . Anxiety   . Depression   . Diabetes mellitus without complication ([place])   . Hyperlipidemia   . Hypertension   . MRSA (methicillin resistant Staphylococcus aureus) infection 1992   history  . Myocardial infarction ([place]) 1995  . Sleep apnea    CPAP  . Urothelial cancer ([place]) 11/2017   Left Urothelial mass, chemo tx's     Past Surgical History:  Procedure Laterality Date  . CATARACT EXTRACTION Left   . COLONOSCOPY  2010   [facility]  . COLONOSCOPY WITH PROPOFOL N/A 10/04/2016   Procedure: COLONOSCOPY WITH PROPOFOL;  Surgeon: [name], MD;  Location: [facility] ENDOSCOPY;  Service: Endoscopy;  Laterality: N/A;  . [place], 2000, 2001, 2014  . CYSTOSCOPY W/ RETROGRADES Bilateral 12/10/2017   Procedure: CYSTOSCOPY WITH RETROGRADE PYELOGRAM;  Surgeon: [name], MD;  Location: [facility] ORS;  Service: Urology;  Laterality: Bilateral;  . CYSTOSCOPY W/ RETROGRADES Left 12/09/2018   Procedure: CYSTOSCOPY WITH RETROGRADE PYELOGRAM;  Surgeon: [name], MD;  Location: [facility] ORS;  Service:  Urology;  Laterality: Left;  . CYSTOSCOPY WITH BIOPSY Left 12/09/2018   Procedure: CYSTOSCOPY WITH URETERAL/RENAL PELVIC BIOPSY;  Surgeon: [name], MD;  Location: [facility] ORS;  Service: Urology;  Laterality: Left;  . CYSTOSCOPY WITH STENT PLACEMENT Left 12/10/2017   Procedure: CYSTOSCOPY WITH STENT PLACEMENT;  Surgeon: [name], MD;  Location: [facility] ORS;  Service: Urology;  Laterality: Left;  . CYSTOSCOPY WITH STENT PLACEMENT Left 12/09/2018   Procedure: CYSTOSCOPY WITH STENT PLACEMENT;  Surgeon: [name], MD;  Location: [facility] ORS;  Service: Urology;  Laterality: Left;  . CYSTOSCOPY WITH URETEROSCOPY Left 12/09/2018   Procedure: CYSTOSCOPY WITH URETEROSCOPY;  Surgeon: [name], MD;  Location: [facility] ORS;  Service: Urology;  Laterality: Left;  . EYE SURGERY Bilateral    cataract  . INGUINAL HERNIA REPAIR Right 08/09/2015   Procedure: HERNIA REPAIR INGUINAL ADULT;  Surgeon: [name], MD;  Location: [facility] ORS;  Service: General;  Laterality: Right;  . NASAL SINUS SURGERY    . nuclear stress test    . PORTA CATH INSERTION N/A 12/19/2017   Procedure: PORTA CATH INSERTION;  Surgeon: [name], MD;  Location: [place] CV LAB;  Service: Cardiovascular;  Laterality: N/A;  . TOOTH EXTRACTION  05/28/2020   upper left and it was molars  . URETERAL BIOPSY Left 12/10/2017   Procedure: URETERAL & renal PELVIS BIOPSY;  Surgeon: [name], MD;  Location: [facility] ORS;  Service: Urology;  Laterality: Left;  . URETEROSCOPY Left 12/10/2017   Procedure: URETEROSCOPY;  Surgeon: [name], MD;  Location: [facility] ORS;  Service: Urology;  Laterality: Left;    Social History   Socioeconomic History  . Marital status: Married    Spouse name: [name]  . Number of children: 1  . Years of education: Not on file  . Highest education level: Associate degree: occupational, Hotel manager, or vocational program  Occupational History  . Occupation: retired  Tobacco Use  . Smoking status:  Former Smoker    Types: Cigars    Quit date: 10/09/1978    Years since quitting: 42.1  . Smokeless tobacco: Former Systems developer    Types: Chew    Quit date: 12/04/1988  . Tobacco comment: on occasion  Vaping Use  . Vaping Use: Never used  Substance and Sexual Activity  . Alcohol use: Not Currently  . Drug use: No  . Sexual activity: Not Currently  Other Topics Concern  . Not on file  Social History Narrative  . Not on file   Social Determinants of Health   Financial Resource Strain: Low Risk   . Difficulty of Paying Living Expenses: Not hard at all  Food Insecurity: No Food Insecurity  . Worried About Charity fundraiser in the Last Year: Never true  . Ran Out of Food in the Last Year: Never true  Transportation Needs: No Transportation Needs  . Lack of Transportation (Medical): No  . Lack of Transportation (Non-Medical): No  Physical Activity: Insufficiently Active  . Days of Exercise per Week: 3 days  . Minutes of Exercise per Session: 30 min  Stress: No Stress Concern Present  . Feeling of Stress : Not at all  Social Connections: Moderately Integrated  . Frequency of Communication with Friends and Family: Three times a week  . Frequency of Social Gatherings with Friends and Family: More than three times a week  . Attends Religious Services: More than 4 times per year  . Active Member of Clubs or Organizations: No  . Attends Archivist Meetings: Never  . Marital Status: Married  Human resources officer Violence: Not At Risk  . Fear of Current or Ex-Partner: No  . Emotionally Abused: No  . Physically Abused: No  . Sexually Abused: No    Family History  Problem Relation Age of Onset  . Heart disease Mother   . Cancer Father        Lung and colon cancer  . Heart disease Father   . Emphysema Maternal Grandfather   . Tuberculosis Maternal Grandmother      Current Outpatient Medications:  .  aspirin EC 81 MG tablet, Take 81 mg by mouth every evening. , Disp: , Rfl:  .   atorvastatin (LIPITOR) 40 MG tablet, TAKE 1 TABLET BY MOUTH AT BEDTIME, Disp: 90 tablet, Rfl: 0 .  blood glucose meter kit and supplies, Dispense based on patient and insurance preference. Use once daily as directed. (FOR ICD-10 E11.9)., Disp: 1 each, Rfl: 0 .  Blood Glucose Monitoring Suppl (ONE TOUCH ULTRA 2) w/Device KIT, 1 each by Does not apply route once for 1 dose. Test blood sugars once daily, Disp: 1 kit, Rfl: 0 .  Cholecalciferol (VITAMIN D3) 25 MCG (1000 UT) CAPS, Take 1 capsule by mouth daily., Disp: , Rfl:  .  empagliflozin (JARDIANCE) 10 MG TABS tablet, Take 1 tablet (10 mg total) by mouth daily., Disp: 90 tablet, Rfl: 2 .  fluticasone (FLONASE) 50 MCG/ACT nasal spray, Use  2 spray(s) in each nostril once daily (Patient taking differently: Place 1 spray into both nostrils daily as needed.), Disp: 48 g, Rfl: 3 .  glucose blood (ONETOUCH ULTRA) test strip, Use as instructed, Disp: 100 each, Rfl: 12 .  hydrOXYzine (ATARAX/VISTARIL) 25 MG tablet, Take 1 tablet (25 mg total) by mouth every 8 (eight) hours as needed for itching., Disp: 30 tablet, Rfl: 3 .  Lancets (ONETOUCH ULTRASOFT) lancets, Use as instructed, Disp: 100 each, Rfl: 12 .  losartan (COZAAR) 50 MG tablet, TAKE 1 TABLET BY MOUTH ONCE DAILY (SCHEDULE  OFFICE  VISIT  FOR  FURTHER  REFILLS), Disp: 90 tablet, Rfl: 0 .  pregabalin (LYRICA) 75 MG capsule, Take 1 capsule by mouth once daily, Disp: 90 capsule, Rfl: 1 .  psyllium (METAMUCIL) 58.6 % powder, Take 1 packet by mouth daily., Disp: , Rfl:  .  sertraline (ZOLOFT) 50 MG tablet, Take 1 tablet (50 mg total) by mouth daily., Disp: 90 tablet, Rfl: 3 .  vitamin B-12 (CYANOCOBALAMIN) 1000 MCG tablet, Take by mouth daily. Unsure dose, Disp: , Rfl:  .  clobetasol cream (TEMOVATE) 0.73 %, Apply 1 application topically as needed (for skin rash). (Patient not taking: Reported on 12/07/2020), Disp: 30 g, Rfl: 1 .  Ivermectin 1 % CREA, Apply 1 application topically daily as needed (rosacea).  To face (Patient not taking: Reported on 12/07/2020), Disp: , Rfl:  .  lidocaine-prilocaine (EMLA) cream, Apply 1 application topically as needed (port access). (Patient not taking: Reported on 12/07/2020), Disp: 1 g, Rfl: 3 .  magnesium hydroxide (MILK OF MAGNESIA) 400 MG/5ML suspension, Take 15 mLs by mouth daily as needed for mild constipation. (Patient not taking: Reported on 11/08/2020), Disp: , Rfl:  .  metFORMIN (GLUCOPHAGE) 1000 MG tablet, TAKE 1 TABLET BY MOUTH TWICE DAILY WITH MEALS, Disp: 60 tablet, Rfl: 2 .  nystatin cream (MYCOSTATIN), Apply topically 2 (two) times daily. (Patient taking differently: Apply 1 application topically 2 (two) times daily as needed.), Disp: 15 g, Rfl: 1 .  OVER THE COUNTER MEDICATION, Apply 1 application topically daily as needed (pain). Outback topical pain relief  (Patient not taking: Reported on 12/07/2020), Disp: , Rfl:  .  vitamin C (ASCORBIC ACID) 500 MG tablet, Take 1,000-1,500 mg by mouth 2 (two) times daily. (Patient not taking: No sig reported), Disp: , Rfl:  No current facility-administered medications for this visit.  Facility-Administered Medications Ordered in Other Visits:  .  Influenza vac split quadrivalent PF (FLUZONE HIGH-DOSE) injection 0.5 mL, 0.5 mL, Intramuscular, Once, Karen Kitchens, NP  Physical exam:  Vitals:   12/07/20 1311  BP: 136/76  Pulse: 85  Resp: 18  Temp: (!) 96.8 F (36 C)  TempSrc: Tympanic  SpO2: 100%  Weight: 154 lb 11.2 oz (70.2 kg)   Physical Exam Constitutional:      General: He is not in acute distress. Cardiovascular:     Rate and Rhythm: Normal rate and regular rhythm.     Heart sounds: Normal heart sounds.  Pulmonary:     Effort: Pulmonary effort is normal.     Breath sounds: Normal breath sounds.  Abdominal:     General: Bowel sounds are normal.     Palpations: Abdomen is soft.  Skin:    General: Skin is warm and dry.  Neurological:     Mental Status: He is alert and oriented to person, place,  and time.      CMP Latest Ref Rng & Units 12/07/2020  Glucose 70 -  99 mg/dL 121(H)  BUN 8 - 23 mg/dL 24(H)  Creatinine 0.61 - 1.24 mg/dL 0.88  Sodium 135 - 145 mmol/L 136  Potassium 3.5 - 5.1 mmol/L 4.2  Chloride 98 - 111 mmol/L 101  CO2 22 - 32 mmol/L 23  Calcium 8.9 - 10.3 mg/dL 9.4  Total Protein 6.5 - 8.1 g/dL 7.3  Total Bilirubin 0.3 - 1.2 mg/dL 1.1  Alkaline Phos 38 - 126 U/L 100  AST 15 - 41 U/L 13(L)  ALT 0 - 44 U/L 8   CBC Latest Ref Rng & Units 12/07/2020  WBC 4.0 - 10.5 K/uL 8.6  Hemoglobin 13.0 - 17.0 g/dL 11.5(L)  Hematocrit 39.0 - 52.0 % 34.6(L)  Platelets 150 - 400 K/uL 226     Assessment and plan- Patient is a 75 y.o. 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 Patient is here for next cycle of Padcev  Patient has only been getting Padcev 1 week on 3 weeks off instead of 3 weeks on 1 week off due to concerns of peripheral neuropathy as well as quality of life and ongoing fatigue. Patient had a scan back in January 2022 when he had a break which showed mild increase in the soft tissue density in the left lower pole when Padcev was restarted.  Labs from 12/07/2020 show stable blood counts.  Proceed with next cycle of Padcev.   He is scheduled for CT CAP on 12/20/2020.  Fatigue: Likely secondary to inactivity and poor nutritional intake.  I have encouraged the patient to get back to his normal sleep wake cycle and work on getting some mild exercise as well as improving nutritional intake.  RTC on 01/03/21 to review CT scan, md assessment and padcev.    Visit Diagnosis 1. Urothelial carcinoma of kidney, left (HCC)     Greater than 50% was spent in counseling and coordination of care with this patient including but not limited to discussion of the relevant topics above (See A&P) including, but not limited to diagnosis and management of acute and chronic medical conditions.   Faythe Casa, NP 12/07/2020 3:52  PM

## 2020-12-07 NOTE — Progress Notes (Signed)
Patient tolerated PADCEV infusion well today, no concerns voiced. Patient discharged. Stable.

## 2020-12-20 ENCOUNTER — Ambulatory Visit
Admission: RE | Admit: 2020-12-20 | Discharge: 2020-12-20 | Disposition: A | Discharge status: Home / Self Care | Payer: HMO | Source: Ambulatory Visit | Attending: Oncology | Admitting: Oncology

## 2020-12-20 ENCOUNTER — Other Ambulatory Visit: Payer: Self-pay

## 2020-12-20 DIAGNOSIS — C642 Malignant neoplasm of left kidney, except renal pelvis: Secondary | ICD-10-CM | POA: Diagnosis present

## 2020-12-20 MED ORDER — IOHEXOL 300 MG/ML  SOLN
100.0000 mL | Freq: Once | INTRAMUSCULAR | Status: AC | PRN
  Administered 2020-12-20: 100 mL via INTRAVENOUS

## 2021-01-03 ENCOUNTER — Inpatient Hospital Stay (HOSPITAL_BASED_OUTPATIENT_CLINIC_OR_DEPARTMENT_OTHER): Payer: HMO | Admitting: Oncology

## 2021-01-03 ENCOUNTER — Inpatient Hospital Stay: Payer: HMO

## 2021-01-03 ENCOUNTER — Encounter: Payer: Self-pay | Admitting: Oncology

## 2021-01-03 ENCOUNTER — Inpatient Hospital Stay: Payer: HMO | Attending: Oncology

## 2021-01-03 VITALS — BP 117/86 | HR 85 | Temp 96.5°F | Resp 14 | Wt 151.9 lb

## 2021-01-03 DIAGNOSIS — E785 Hyperlipidemia, unspecified: Secondary | ICD-10-CM | POA: Insufficient documentation

## 2021-01-03 DIAGNOSIS — M47816 Spondylosis without myelopathy or radiculopathy, lumbar region: Secondary | ICD-10-CM | POA: Diagnosis not present

## 2021-01-03 DIAGNOSIS — Z801 Family history of malignant neoplasm of trachea, bronchus and lung: Secondary | ICD-10-CM | POA: Diagnosis not present

## 2021-01-03 DIAGNOSIS — T451X5A Adverse effect of antineoplastic and immunosuppressive drugs, initial encounter: Secondary | ICD-10-CM

## 2021-01-03 DIAGNOSIS — Z87891 Personal history of nicotine dependence: Secondary | ICD-10-CM | POA: Diagnosis not present

## 2021-01-03 DIAGNOSIS — N1330 Unspecified hydronephrosis: Secondary | ICD-10-CM | POA: Diagnosis not present

## 2021-01-03 DIAGNOSIS — Z8249 Family history of ischemic heart disease and other diseases of the circulatory system: Secondary | ICD-10-CM | POA: Insufficient documentation

## 2021-01-03 DIAGNOSIS — C642 Malignant neoplasm of left kidney, except renal pelvis: Secondary | ICD-10-CM | POA: Insufficient documentation

## 2021-01-03 DIAGNOSIS — E538 Deficiency of other specified B group vitamins: Secondary | ICD-10-CM | POA: Diagnosis not present

## 2021-01-03 DIAGNOSIS — E119 Type 2 diabetes mellitus without complications: Secondary | ICD-10-CM | POA: Insufficient documentation

## 2021-01-03 DIAGNOSIS — Z836 Family history of other diseases of the respiratory system: Secondary | ICD-10-CM | POA: Diagnosis not present

## 2021-01-03 DIAGNOSIS — J849 Interstitial pulmonary disease, unspecified: Secondary | ICD-10-CM | POA: Diagnosis not present

## 2021-01-03 DIAGNOSIS — N281 Cyst of kidney, acquired: Secondary | ICD-10-CM | POA: Diagnosis not present

## 2021-01-03 DIAGNOSIS — Z800 Family history of malignant neoplasm of digestive organs: Secondary | ICD-10-CM | POA: Diagnosis not present

## 2021-01-03 DIAGNOSIS — D701 Agranulocytosis secondary to cancer chemotherapy: Secondary | ICD-10-CM | POA: Diagnosis not present

## 2021-01-03 DIAGNOSIS — I252 Old myocardial infarction: Secondary | ICD-10-CM | POA: Diagnosis not present

## 2021-01-03 DIAGNOSIS — Z95828 Presence of other vascular implants and grafts: Secondary | ICD-10-CM

## 2021-01-03 DIAGNOSIS — G629 Polyneuropathy, unspecified: Secondary | ICD-10-CM | POA: Insufficient documentation

## 2021-01-03 DIAGNOSIS — Z8614 Personal history of Methicillin resistant Staphylococcus aureus infection: Secondary | ICD-10-CM | POA: Insufficient documentation

## 2021-01-03 DIAGNOSIS — Z79899 Other long term (current) drug therapy: Secondary | ICD-10-CM | POA: Diagnosis not present

## 2021-01-03 DIAGNOSIS — I2510 Atherosclerotic heart disease of native coronary artery without angina pectoris: Secondary | ICD-10-CM | POA: Insufficient documentation

## 2021-01-03 DIAGNOSIS — Z5112 Encounter for antineoplastic immunotherapy: Secondary | ICD-10-CM | POA: Insufficient documentation

## 2021-01-03 DIAGNOSIS — C778 Secondary and unspecified malignant neoplasm of lymph nodes of multiple regions: Secondary | ICD-10-CM | POA: Insufficient documentation

## 2021-01-03 DIAGNOSIS — Z882 Allergy status to sulfonamides status: Secondary | ICD-10-CM | POA: Diagnosis not present

## 2021-01-03 DIAGNOSIS — Z880 Allergy status to penicillin: Secondary | ICD-10-CM | POA: Diagnosis not present

## 2021-01-03 DIAGNOSIS — D6481 Anemia due to antineoplastic chemotherapy: Secondary | ICD-10-CM | POA: Insufficient documentation

## 2021-01-03 LAB — CBC WITH DIFFERENTIAL/PLATELET
Abs Immature Granulocytes: 0.04 10*3/uL (ref 0.00–0.07)
Basophils Absolute: 0 10*3/uL (ref 0.0–0.1)
Basophils Relative: 0 %
Eosinophils Absolute: 0.2 10*3/uL (ref 0.0–0.5)
Eosinophils Relative: 2 %
HCT: 34.9 % — ABNORMAL LOW (ref 39.0–52.0)
Hemoglobin: 11.5 g/dL — ABNORMAL LOW (ref 13.0–17.0)
Immature Granulocytes: 0 %
Lymphocytes Relative: 10 %
Lymphs Abs: 1 10*3/uL (ref 0.7–4.0)
MCH: 27.9 pg (ref 26.0–34.0)
MCHC: 33 g/dL (ref 30.0–36.0)
MCV: 84.7 fL (ref 80.0–100.0)
Monocytes Absolute: 0.8 10*3/uL (ref 0.1–1.0)
Monocytes Relative: 8 %
Neutro Abs: 8 10*3/uL — ABNORMAL HIGH (ref 1.7–7.7)
Neutrophils Relative %: 80 %
Platelets: 245 10*3/uL (ref 150–400)
RBC: 4.12 MIL/uL — ABNORMAL LOW (ref 4.22–5.81)
RDW: 14.5 % (ref 11.5–15.5)
WBC: 10.1 10*3/uL (ref 4.0–10.5)
nRBC: 0 % (ref 0.0–0.2)

## 2021-01-03 LAB — COMPREHENSIVE METABOLIC PANEL
ALT: 9 U/L (ref 0–44)
AST: 15 U/L (ref 15–41)
Albumin: 3.4 g/dL — ABNORMAL LOW (ref 3.5–5.0)
Alkaline Phosphatase: 95 U/L (ref 38–126)
Anion gap: 9 (ref 5–15)
BUN: 23 mg/dL (ref 8–23)
CO2: 26 mmol/L (ref 22–32)
Calcium: 9.5 mg/dL (ref 8.9–10.3)
Chloride: 97 mmol/L — ABNORMAL LOW (ref 98–111)
Creatinine, Ser: 0.79 mg/dL (ref 0.61–1.24)
GFR, Estimated: >60 mL/min (ref >60)
Glucose, Bld: 121 mg/dL — ABNORMAL HIGH (ref 70–99)
Potassium: 4.1 mmol/L (ref 3.5–5.1)
Sodium: 132 mmol/L — ABNORMAL LOW (ref 135–145)
Total Bilirubin: 1.3 mg/dL — ABNORMAL HIGH (ref 0.3–1.2)
Total Protein: 7.2 g/dL (ref 6.5–8.1)

## 2021-01-03 MED ORDER — PALONOSETRON HCL INJECTION 0.25 MG/5ML
0.2500 mg | Freq: Once | INTRAVENOUS | Status: AC
  Administered 2021-01-03: 0.25 mg via INTRAVENOUS
  Filled 2021-01-03: qty 5

## 2021-01-03 MED ORDER — HEPARIN SOD (PORK) LOCK FLUSH 100 UNIT/ML IV SOLN
INTRAVENOUS | Status: AC
  Filled 2021-01-03: qty 5

## 2021-01-03 MED ORDER — SODIUM CHLORIDE 0.9 % IV SOLN
Freq: Once | INTRAVENOUS | Status: AC
Start: 2021-01-03 — End: 2021-01-03
  Filled 2021-01-03: qty 250

## 2021-01-03 MED ORDER — HEPARIN SOD (PORK) LOCK FLUSH 100 UNIT/ML IV SOLN
500.0000 [IU] | Freq: Once | INTRAVENOUS | Status: AC
Start: 2021-01-03 — End: 2021-01-03
  Administered 2021-01-03: 500 [IU] via INTRAVENOUS
  Filled 2021-01-03: qty 5

## 2021-01-03 MED ORDER — SODIUM CHLORIDE 0.9% FLUSH
10.0000 mL | Freq: Once | INTRAVENOUS | Status: AC
  Administered 2021-01-03: 10 mL via INTRAVENOUS
  Filled 2021-01-03: qty 10

## 2021-01-03 MED ORDER — ENFORTUMAB VEDOTIN-EJFV CHEMO 20 MG IV SOLR
1.0000 mg/kg | Freq: Once | INTRAVENOUS | Status: AC
  Administered 2021-01-03: 70 mg via INTRAVENOUS
  Filled 2021-01-03: qty 3

## 2021-01-03 NOTE — Patient Instructions (Signed)
[facility]   Discharge Instructions: Thank you for choosing [facility] to provide your oncology and hematology care.  If you have a lab appointment with the [facility], please go directly to the [facility] and check in at the registration area.  Wear comfortable clothing and clothing appropriate for easy access to any Portacath or PICC line.   We strive to give you quality time with your provider. You may need to reschedule your appointment if you arrive late (15 or more minutes).  Arriving late affects you and other patients whose appointments are after yours.  Also, if you miss three or more appointments without notifying the office, you may be dismissed from the clinic at the provider's discretion.      For prescription refill requests, have your pharmacy contact our office and allow 72 hours for refills to be completed.    Today you received the following chemotherapy and/or immunotherapy agents: Enfortumab Vedotin-ejfv (Padcev).      To help prevent nausea and vomiting after your treatment, we encourage you to take your nausea medication as directed.  BELOW ARE SYMPTOMS THAT SHOULD BE REPORTED IMMEDIATELY: *FEVER GREATER THAN 100.4 F (38 C) OR HIGHER *CHILLS OR SWEATING *NAUSEA AND VOMITING THAT IS NOT CONTROLLED WITH YOUR NAUSEA MEDICATION *UNUSUAL SHORTNESS OF BREATH *UNUSUAL BRUISING OR BLEEDING *URINARY PROBLEMS (pain or burning when urinating, or frequent urination) *BOWEL PROBLEMS (unusual diarrhea, constipation, pain near the anus) TENDERNESS IN MOUTH AND THROAT WITH OR WITHOUT PRESENCE OF ULCERS (sore throat, sores in mouth, or a toothache) UNUSUAL RASH, SWELLING OR PAIN  UNUSUAL VAGINAL DISCHARGE OR ITCHING   Items with * indicate a potential emergency and should be followed up as soon as possible or go to the Emergency Department if any problems should occur.  Please show the CHEMOTHERAPY ALERT CARD or  IMMUNOTHERAPY ALERT CARD at check-in to the Emergency Department and triage nurse.  Should you have questions after your visit or need to cancel or reschedule your appointment, please contact [facility]  [phone] and follow the prompts.  Office hours are 8:00 a.m. to 4:30 p.m. Monday - Friday. Please note that voicemails left after 4:00 p.m. may not be returned until the following business day.  We are closed weekends and major holidays. You have access to a nurse at all times for urgent questions. Please call the main number to the clinic [phone] and follow the prompts.  For any non-urgent questions, you may also contact your provider using MyChart. We now offer e-Visits for anyone 28 and older to request care online for non-urgent symptoms. For details visit [email or web address].   Also download the MyChart app! Go to the app store, search "MyChart", open the app, select [facility], and log in with your MyChart username and password.  Due to Covid, a mask is required upon entering the hospital/clinic. If you do not have a mask, one will be given to you upon arrival. For doctor visits, patients may have 1 support person aged 26 or older with them. For treatment visits, patients cannot have anyone with them due to current Covid guidelines and our immunocompromised population.

## 2021-01-05 ENCOUNTER — Encounter: Payer: Self-pay | Admitting: Oncology

## 2021-01-05 ENCOUNTER — Encounter: Payer: Self-pay | Admitting: Hematology and Oncology

## 2021-01-05 NOTE — Progress Notes (Signed)
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 Merlene Pulling so far for his metastatic urothelial carcinoma.  This was originally diagnosed in May 2019.  He was noted to have a filling defect in the lower pole collecting system of the left kidney along with para-aortic and retroperitoneal adenopathy concerning for metastatic disease.  He underwent left ureteroscopy and renal pelvis biopsy which revealed small fragments of high-grade urothelial carcinoma with small focus of invasion.  He was started on carboplatin and gemcitabine chemotherapy in June 2019 and was continued on 05/27/2018 for 8 cycles.  He tolerated chemotherapy well except for chemo-induced anemia for which she has been getting Procrit every 2 weeks.  He did have response to his disease based on scans in August 2019.  However repeat scan on 05/31/2018 showed increase in the size of the primary tumor from 1.2 to 1.9 cm and increase in left para-aortic adenopathy from 0.9 to 1.3 cm.  Periportal adenopathy was stable at 1.4 cm.  Second line immunotherapy was recommended.  His initial biopsy specimen did not have enough sample to undergo FGFR mutation testing.  He also has mild interstitial lung disease for which he sees pulmonary  but he is not on home oxygen.  He has B12 deficiency for which he is on oral B12.  Also has diabetes and coronary artery disease.  Tecentriq started on 06/17/2018.  Patient noted to have disease progression in his lymph nodes in May 2020.  Repeat biopsy showed metastatic urothelial carcinoma which did not have a FGFR mutation.  He has been started on third line Padcev   Treatment on hold since April 2021 due to neuropathy and to maintain quality of life but restarted in August 2021 after he was found to have progression of disease which he has 1 week on 3 weeks off      Interval history-neuropathy is stable.  He mostly sleeps during the day and wakes up at all hours and mostly stays awake through the night.  ECOG PS- 1 Pain scale- 0   Review of systems- Review of Systems  Constitutional:  Positive for malaise/fatigue. Negative for chills, fever and weight loss.  HENT:  Negative for congestion, ear discharge and nosebleeds.   Eyes:  Negative for blurred vision.  Respiratory:  Negative for cough, hemoptysis, sputum production, shortness of breath and wheezing.   Cardiovascular:  Negative for chest pain, palpitations, orthopnea and claudication.  Gastrointestinal:  Negative for abdominal pain, blood in stool, constipation, diarrhea, heartburn, melena, nausea and vomiting.  Genitourinary:  Negative for dysuria, flank pain, frequency, hematuria and urgency.  Musculoskeletal:  Negative for back pain, joint pain and myalgias.  Skin:  Negative for rash.  Neurological:  Positive for sensory change (Peripheral neuropathy). Negative for dizziness, tingling, focal weakness, seizures, weakness and headaches.  Endo/Heme/Allergies:  Does not bruise/bleed easily.  Psychiatric/Behavioral:  Negative for depression and suicidal  ideas. The patient does not have insomnia.       Allergies  Allergen Reactions   Sulfa Antibiotics Other (See Comments)    Joint pain   Ace Inhibitors Cough   Invokana  [Canagliflozin] Other (See Comments)    Leg pain   Penicillins Rash    Did it involve swelling of the face/tongue/throat, SOB, or low BP? No Did it involve sudden or severe rash/hives, skin peeling, or any reaction on the inside of your mouth or nose? Yes Did you need to seek medical attention at a hospital or doctor's office? Yes When did it last happen?      childhood allergy If all above answers are "NO", may proceed with cephalosporin use.      Past Medical History:  Diagnosis Date   Acid reflux    Anxiety    Depression    Diabetes mellitus without complication (HCC)    Hyperlipidemia    Hypertension    MRSA (methicillin resistant Staphylococcus aureus) infection 1992   history   Myocardial infarction (HCC) 1995   Sleep apnea    CPAP   Urothelial cancer (HCC) 11/2017   Left Urothelial mass, chemo tx's     Past Surgical History:  Procedure Laterality Date   CATARACT EXTRACTION Left    COLONOSCOPY  2010   [facility]   COLONOSCOPY WITH PROPOFOL N/A 10/04/2016   Procedure: COLONOSCOPY WITH PROPOFOL;  Surgeon: Scot Jun, MD;  Location: [facility] ENDOSCOPY;  Service: Endoscopy;  Laterality: N/A;   CORONARY ANGIOPLASTY WITH STENT PLACEMENT  1995, 1998, 2000, 2001, 2014   CYSTOSCOPY W/ RETROGRADES Bilateral 12/10/2017   Procedure: CYSTOSCOPY WITH RETROGRADE PYELOGRAM;  Surgeon: Vanna Scotland, MD;  Location: [facility] ORS;  Service: Urology;  Laterality: Bilateral;   CYSTOSCOPY W/ RETROGRADES Left 12/09/2018   Procedure: CYSTOSCOPY WITH RETROGRADE PYELOGRAM;  Surgeon: Vanna Scotland, MD;  Location: [facility] ORS;  Service: Urology;  Laterality: Left;   CYSTOSCOPY WITH BIOPSY Left 12/09/2018   Procedure: CYSTOSCOPY WITH URETERAL/RENAL PELVIC BIOPSY;  Surgeon: Vanna Scotland, MD;  Location: [facility] ORS;  Service: Urology;  Laterality: Left;   CYSTOSCOPY WITH STENT PLACEMENT Left 12/10/2017   Procedure: CYSTOSCOPY WITH STENT PLACEMENT;  Surgeon: Vanna Scotland, MD;  Location: [facility] ORS;  Service:  Urology;  Laterality: Left;   CYSTOSCOPY WITH STENT PLACEMENT Left 12/09/2018   Procedure: CYSTOSCOPY WITH STENT PLACEMENT;  Surgeon: Vanna Scotland, MD;  Location: [facility] ORS;  Service: Urology;  Laterality: Left;   CYSTOSCOPY WITH URETEROSCOPY Left 12/09/2018   Procedure: CYSTOSCOPY WITH URETEROSCOPY;  Surgeon: Vanna Scotland, MD;  Location: [facility] ORS;  Service: Urology;  Laterality: Left;   EYE SURGERY Bilateral    cataract   INGUINAL HERNIA REPAIR Right 08/09/2015   Procedure: HERNIA REPAIR INGUINAL ADULT;  Surgeon: Earline Mayotte, MD;  Location: [facility] ORS;  Service: General;  Laterality: Right;   NASAL SINUS SURGERY     nuclear stress test     PORTA CATH INSERTION N/A 12/19/2017   Procedure: PORTA CATH INSERTION;  Surgeon: Annice Needy, MD;  Location: [facility] INVASIVE CV LAB;  Service: Cardiovascular;  Laterality: N/A;   TOOTH EXTRACTION  05/28/2020   upper left and it was molars   URETERAL BIOPSY Left 12/10/2017   Procedure: URETERAL & renal PELVIS BIOPSY;  Surgeon: Vanna Scotland, MD;  Location: [facility] ORS;  Service: Urology;  Laterality: Left;   URETEROSCOPY Left 12/10/2017   Procedure: URETEROSCOPY;  Surgeon: Vanna Scotland, MD;  Location: [facility] ORS;  Service: Urology;  Laterality: Left;  Social History   Socioeconomic History   Marital status: Married    Spouse name: [name]   Number of children: 1   Years of education: Not on file   Highest education level: Associate degree: occupational, Scientist, product/process development, or vocational program  Occupational History   Occupation: retired  Tobacco Use   Smoking status: Former    Pack years: 0.00    Types: Cigars    Quit date: 10/09/1978    Years since quitting: 42.2   Smokeless tobacco: Former    Types: Chew    Quit date: 12/04/1988   Tobacco comments:    on occasion  Vaping Use   Vaping Use: Never used  Substance and Sexual Activity   Alcohol use: Not Currently   Drug use: No   Sexual activity: Not Currently  Other Topics Concern   Not on file   Social History Narrative   Not on file   Social Determinants of Health   Financial Resource Strain: Low Risk    Difficulty of Paying Living Expenses: Not hard at all  Food Insecurity: No Food Insecurity   Worried About Programme researcher, broadcasting/film/video in the Last Year: Never true   Ran Out of Food in the Last Year: Never true  Transportation Needs: No Transportation Needs   Lack of Transportation (Medical): No   Lack of Transportation (Non-Medical): No  Physical Activity: Insufficiently Active   Days of Exercise per Week: 3 days   Minutes of Exercise per Session: 30 min  Stress: No Stress Concern Present   Feeling of Stress : Not at all  Social Connections: Moderately Integrated   Frequency of Communication with Friends and Family: Three times a week   Frequency of Social Gatherings with Friends and Family: More than three times a week   Attends Religious Services: More than 4 times per year   Active Member of [employer] or Organizations: No   Attends Engineer, structural: Never   Marital Status: Married  Catering manager Violence: Not At Risk   Fear of Current or Ex-Partner: No   Emotionally Abused: No   Physically Abused: No   Sexually Abused: No    Family History  Problem Relation Age of Onset   Heart disease Mother    Cancer Father        Lung and colon cancer   Heart disease Father    Emphysema Maternal Grandfather    Tuberculosis Maternal Grandmother      Current Outpatient Medications:    aspirin EC 81 MG tablet, Take 81 mg by mouth every evening. , Disp: , Rfl:    atorvastatin (LIPITOR) 40 MG tablet, TAKE 1 TABLET BY MOUTH AT BEDTIME, Disp: 90 tablet, Rfl: 0   blood glucose meter kit and supplies, Dispense based on patient and insurance preference. Use once daily as directed. (FOR ICD-10 E11.9)., Disp: 1 each, Rfl: 0   Blood Glucose Monitoring Suppl (ONE TOUCH ULTRA 2) w/Device KIT, 1 each by Does not apply route once for 1 dose. Test blood sugars once daily, Disp: 1  kit, Rfl: 0   Cholecalciferol (VITAMIN D3) 25 MCG (1000 UT) CAPS, Take 1 capsule by mouth daily., Disp: , Rfl:    empagliflozin (JARDIANCE) 10 MG TABS tablet, Take 1 tablet (10 mg total) by mouth daily., Disp: 90 tablet, Rfl: 2   glucose blood (ONETOUCH ULTRA) test strip, Use as instructed, Disp: 100 each, Rfl: 12   Lancets (ONETOUCH ULTRASOFT) lancets, Use as instructed, Disp: 100 each, Rfl: 12  losartan (COZAAR) 50 MG tablet, TAKE 1 TABLET BY MOUTH ONCE DAILY (SCHEDULE  OFFICE  VISIT  FOR  FURTHER  REFILLS), Disp: 90 tablet, Rfl: 0   metFORMIN (GLUCOPHAGE) 1000 MG tablet, TAKE 1 TABLET BY MOUTH TWICE DAILY WITH MEALS, Disp: 60 tablet, Rfl: 2   pregabalin (LYRICA) 75 MG capsule, Take 1 capsule by mouth once daily, Disp: 90 capsule, Rfl: 1   psyllium (METAMUCIL) 58.6 % powder, Take 1 packet by mouth daily., Disp: , Rfl:    sertraline (ZOLOFT) 50 MG tablet, Take 1 tablet (50 mg total) by mouth daily., Disp: 90 tablet, Rfl: 3   vitamin B-12 (CYANOCOBALAMIN) 1000 MCG tablet, Take by mouth daily. Unsure dose, Disp: , Rfl:    vitamin C (ASCORBIC ACID) 500 MG tablet, Take 1,000-1,500 mg by mouth 2 (two) times daily., Disp: , Rfl:    clobetasol cream (TEMOVATE) 0.05 %, Apply 1 application topically as needed (for skin rash). (Patient not taking: No sig reported), Disp: 30 g, Rfl: 1   fluticasone (FLONASE) 50 MCG/ACT nasal spray, Use 2 spray(s) in each nostril once daily (Patient not taking: Reported on 01/03/2021), Disp: 48 g, Rfl: 3   hydrOXYzine (ATARAX/VISTARIL) 25 MG tablet, Take 1 tablet (25 mg total) by mouth every 8 (eight) hours as needed for itching. (Patient not taking: Reported on 01/03/2021), Disp: 30 tablet, Rfl: 3   Ivermectin 1 % CREA, Apply 1 application topically daily as needed (rosacea). To face (Patient not taking: No sig reported), Disp: , Rfl:    lidocaine-prilocaine (EMLA) cream, Apply 1 application topically as needed (port access). (Patient not taking: No sig reported), Disp: 1 g,  Rfl: 3   magnesium hydroxide (MILK OF MAGNESIA) 400 MG/5ML suspension, Take 15 mLs by mouth daily as needed for mild constipation. (Patient not taking: No sig reported), Disp: , Rfl:    nystatin cream (MYCOSTATIN), Apply topically 2 (two) times daily. (Patient not taking: Reported on 01/03/2021), Disp: 15 g, Rfl: 1   OVER THE COUNTER MEDICATION, Apply 1 application topically daily as needed (pain). Outback topical pain relief  (Patient not taking: No sig reported), Disp: , Rfl:  No current facility-administered medications for this visit.  Facility-Administered Medications Ordered in Other Visits:    Influenza vac split quadrivalent PF (FLUZONE HIGH-DOSE) injection 0.5 mL, 0.5 mL, Intramuscular, Once, Verlee Monte, NP  Physical exam:  Vitals:   01/03/21 1309  BP: 117/86  Pulse: 85  Resp: 14  Temp: (!) 96.5 F (35.8 C)  SpO2: 98%  Weight: 151 lb 14.4 oz (68.9 kg)   Physical Exam Cardiovascular:     Rate and Rhythm: Normal rate and regular rhythm.     Heart sounds: Normal heart sounds.  Pulmonary:     Effort: Pulmonary effort is normal.     Breath sounds: Normal breath sounds.  Abdominal:     General: Bowel sounds are normal.     Palpations: Abdomen is soft.  Skin:    General: Skin is warm and dry.  Neurological:     Mental Status: He is alert and oriented to person, place, and time.     CMP Latest Ref Rng & Units 01/03/2021  Glucose 70 - 99 mg/dL 710(F)  BUN 8 - 23 mg/dL 23  Creatinine 4.16 - 9.95 mg/dL 2.05  Sodium 452 - 527 mmol/L 132(L)  Potassium 3.5 - 5.1 mmol/L 4.1  Chloride 98 - 111 mmol/L 97(L)  CO2 22 - 32 mmol/L 26  Calcium 8.9 - 10.3 mg/dL 9.5  Total Protein 6.5 - 8.1 g/dL 7.2  Total Bilirubin 0.3 - 1.2 mg/dL 1.3(H)  Alkaline Phos 38 - 126 U/L 95  AST 15 - 41 U/L 15  ALT 0 - 44 U/L 9   CBC Latest Ref Rng & Units 01/03/2021  WBC 4.0 - 10.5 K/uL 10.1  Hemoglobin 13.0 - 17.0 g/dL 11.5(L)  Hematocrit 39.0 - 52.0 % 34.9(L)  Platelets 150 - 400 K/uL 245     No images are attached to the encounter.  CT CHEST ABDOMEN PELVIS W CONTRAST  Result Date: 12/20/2020 CLINICAL DATA:  Follow up urothelial cancer.  Therapy on going. EXAM: CT CHEST, ABDOMEN, AND PELVIS WITH CONTRAST TECHNIQUE: Multidetector CT imaging of the chest, abdomen and pelvis was performed following the standard protocol during bolus administration of intravenous contrast. CONTRAST:  173mL OMNIPAQUE IOHEXOL 300 MG/ML  SOLN COMPARISON:  CT 07/30/2020 and 01/21/2020. FINDINGS: CT CHEST FINDINGS Cardiovascular: Extensive coronary artery atherosclerosis with lesser involvement of the aorta and great vessels. No acute vascular findings are identified. The right internal jugular vein appears chronically narrowed. Right IJ Port-A-Cath tip extends to the superior cavoatrial junction. The heart size is normal. There is no pericardial effusion. Mediastinum/Nodes: There are no enlarged mediastinal, hilar or axillary lymph nodes. The thyroid gland, trachea and esophagus demonstrate no significant findings. Lungs/Pleura: No pleural effusion or pneumothorax. Chronic interstitial lung disease appears mildly progressive with subpleural reticulation and mild architectural distortion. No suspicious pulmonary nodule. Musculoskeletal/Chest wall: No chest wall mass or suspicious osseous findings. CT ABDOMEN AND PELVIS FINDINGS Hepatobiliary: The liver is normal in density without suspicious focal abnormality. Stable tiny calcified gallstone. No gallbladder wall thickening or biliary dilatation. Pancreas: Diffuse fatty replacement of the pancreas again noted. No evidence of pancreatic ductal dilatation or surrounding inflammation. Spleen: Normal in size without focal abnormality. Adrenals/Urinary Tract: Both adrenal glands appear normal. Ill-defined infiltrative mass involving the lower pole collecting system and renal pelvis of the left kidney is again noted which appears somewhat progressive. Tumor within the renal  pelvis obstructs the collecting system, preventing contrast excretion on the delayed images. There is increased low-density extending through the lower pole of the left kidney which measures approximately 4.9 x 3.2 cm on image 68/2. The changes could in part be secondary to interval radiation therapy if applicable. Bilateral renal cysts are stable. There is no evidence of right-sided hydronephrosis or other focal urothelial lesion. The bladder appears unremarkable. Stomach/Bowel: Enteric contrast was administered and has passed into the descending colon. There is focal luminal narrowing of the distal stomach which persists on the delayed post-contrast images and has mildly progressed compared with the prior study. The proximal stomach is not significantly distended. The small bowel and appendix appear normal. A large amount of stool is present throughout the colon without wall thickening or surrounding inflammation. Vascular/Lymphatic: There are no enlarged abdominal or pelvic lymph nodes. Mild aortic and branch vessel atherosclerosis without evidence of aneurysm or large vessel occlusion. Reproductive: The prostate gland and seminal vesicles appear normal. Other: No evidence of abdominal wall mass or hernia. No ascites. Musculoskeletal: No acute or significant osseous findings. Mild lumbar spondylosis. IMPRESSION: 1. Recurrent tumor within the left renal pelvis and lower pole collecting system appears progressive compared with the prior study of 5 months ago. Increased ill-defined low-density in the lower pole may in part relate to radiation therapy if applicable. Persistent collecting system obstruction with hydronephrosis and delayed contrast excretion. 2. No distant metastases or urothelial lesions of the right ureter or bladder  seen. 3. Long segment luminal narrowing of the distal stomach appears progressive. This too could reflect gastritis from previous therapy, although infiltrating tumor cannot be  completely excluded. No evidence of gastric outlet obstruction. 4. Mildly progressive interstitial lung disease. 5.  Coronary and aortic Atherosclerosis (ICD10-I70.0). Electronically Signed   By: Richardean Sale M.D.   On: 12/20/2020 15:16     Assessment and plan- Patient is a 75 y.o. 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  Patient is here for next cycle of Padcev and discuss CT scan results and further management  I have reviewed CT chest abdomen and pelvis images independently and discussed findings with the patient.Patient was restarted on Padcev in August 2021 and he has been receiving it at 1 week on 3 weeks off and instead off due to ongoing neuropathy, per patient preference and quality of life.  In January 2022 he was noted to have increase in soft tissue density in the left lower pole Consistent with recurrent tumor which continues to grow and is up to 4.9 x 3.2 cm presently.  He had locoregional adenopathy in 2019 when he was first diagnosed which has now resolved.  No other evidence of distant metastatic disease.  I have spoken to Dr. Donella Stade from radiation oncology who is willing to see him when consider him for radiation treatment to the lower pole lesion.  I will continue Padcev at 1 week on 3 weeks off at this time with a repeat scan in September 2022.  If there is continued progression I will consider increasing Padcev frequency to 2 weeks on 1 week off if he can tolerate it.  Treatment will be given until progression or toxicity   Visit Diagnosis 1. Chemotherapy induced neutropenia (HCC)   2. Urothelial carcinoma of kidney, left (HCC)      Dr. Randa Evens, MD, MPH [facility] [phone] 01/05/2021 3:55 PM

## 2021-01-09 ENCOUNTER — Other Ambulatory Visit: Payer: Self-pay | Admitting: Family Medicine

## 2021-01-09 ENCOUNTER — Other Ambulatory Visit: Payer: Self-pay | Admitting: Oncology

## 2021-01-14 ENCOUNTER — Ambulatory Visit
Admission: RE | Admit: 2021-01-14 | Discharge: 2021-01-14 | Disposition: A | Discharge status: Home / Self Care | Payer: HMO | Source: Ambulatory Visit | Attending: Radiation Oncology | Admitting: Radiation Oncology

## 2021-01-14 ENCOUNTER — Encounter: Payer: Self-pay | Admitting: Radiation Oncology

## 2021-01-14 ENCOUNTER — Other Ambulatory Visit: Payer: Self-pay | Admitting: *Deleted

## 2021-01-14 ENCOUNTER — Telehealth: Payer: Self-pay | Admitting: *Deleted

## 2021-01-14 ENCOUNTER — Other Ambulatory Visit: Payer: Self-pay

## 2021-01-14 VITALS — BP 113/87 | HR 86 | Temp 94.8°F | Resp 14 | Wt 152.8 lb

## 2021-01-14 DIAGNOSIS — C642 Malignant neoplasm of left kidney, except renal pelvis: Secondary | ICD-10-CM | POA: Diagnosis present

## 2021-01-14 NOTE — Telephone Encounter (Signed)
Called and spoke with Patrick Mcgee.  Gave all appointment dates and times for MRI, CT simulation, and radiation appointments.

## 2021-01-14 NOTE — Consult Note (Signed)
NEW PATIENT EVALUATION  Name: Patrick Mcgee  MRN: [id number]  Date:   01/14/2021     DOB: 22-Jul-1945   This 75 y.o. 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  REFERRING PHYSICIAN: Jerrol Banana.,*  CHIEF COMPLAINT:  Chief Complaint  Patient presents with   Cancer    Initial consultation    DIAGNOSIS: The encounter diagnosis was Urothelial carcinoma of kidney, left ([place]).   PREVIOUS INVESTIGATIONS:  CT scans reviewed Pathology reviewed Clinical notes reviewed  HPI: Patient is a 75 year old male longstanding history of urothelial carcinoma originally diagnosed in May 2019.  He presented at filling defect in the lower pole collecting system with periaortic and retroperitoneal adenopathy.  Biopsy revealed small fragments of high-grade urothelial carcinoma with small focus of invasion.  He has been treated with multiple rounds of chemotherapy including carbo and gemcitabine as well as immunotherapy including concentric and third linePadcev.  He is clinically doing well although recent CT scan showed recurrent tumor in the left renal pelvis and lower pole collecting system appearing progressive since prior study of 5 months prior.  He does have persistent collecting system obstruction high hydronephrosis.  No evidence of distant metastatic disease.  The areas of retroperitoneal and periaortic lymph node involvement have resolved.  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  Seen today for consideration of treatment.  PLANNED TREATMENT REGIMEN: SBRT  PAST MEDICAL HISTORY:  has a past medical history of Acid reflux, Anxiety, Depression, Diabetes mellitus without complication ([place]), Hyperlipidemia, Hypertension, MRSA (methicillin resistant Staphylococcus aureus) infection (1992), Myocardial infarction ([place]) (1995), Sleep apnea, and Urothelial  cancer ([place]) (11/2017).    PAST SURGICAL HISTORY:  Past Surgical History:  Procedure Laterality Date   CATARACT EXTRACTION Left    COLONOSCOPY  2010   [facility]   COLONOSCOPY WITH PROPOFOL N/A 10/04/2016   Procedure: COLONOSCOPY WITH PROPOFOL;  Surgeon: [name], MD;  Location: [facility] END[facility]COPY;  Service: Endoscopy;  Laterality: N/A;   [place], 2000, 2001, 2014   CYSTOSCOPY W/ RETROGRADES Bilateral 12/10/2017   Procedure: CYSTOSCOPY WITH RETROGRADE PYELOGRAM;  Surgeon: [name], MD;  Location: [facility] ORS;  Service: Urology;  Laterality: Bilateral;   CYSTOSCOPY W/ RETROGRADES Left 12/09/2018   Procedure: CYSTOSCOPY WITH RETROGRADE PYELOGRAM;  Surgeon: [name], MD;  Location: [facility] ORS;  Service: Urology;  Laterality: Left;   CYSTOSCOPY WITH BIOPSY Left 12/09/2018   Procedure: CYSTOSCOPY WITH URETERAL/RENAL PELVIC BIOPSY;  Surgeon: [name], MD;  Location: [facility] ORS;  Service: Urology;  Laterality: Left;   CYSTOSCOPY WITH STENT PLACEMENT Left 12/10/2017   Procedure: CYSTOSCOPY WITH STENT PLACEMENT;  Surgeon: [name], MD;  Location: [facility] ORS;  Service: Urology;  Laterality: Left;   CYSTOSCOPY WITH STENT PLACEMENT Left 12/09/2018   Procedure: CYSTOSCOPY WITH STENT PLACEMENT;  Surgeon: [name], MD;  Location: [facility] ORS;  Service: Urology;  Laterality: Left;   CYSTOSCOPY WITH URETEROSCOPY Left 12/09/2018   Procedure: CYSTOSCOPY WITH URETEROSCOPY;  Surgeon: [name], MD;  Location: [facility] ORS;  Service: Urology;  Laterality: Left;   EYE SURGERY Bilateral    cataract   INGUINAL HERNIA REPAIR Right 08/09/2015   Procedure: HERNIA REPAIR INGUINAL ADULT;  Surgeon: [name], MD;  Location: [facility] ORS;  Service: General;  Laterality: Right;   NASAL SINUS SURGERY     nuclear stress test  PORTA CATH INSERTION N/A 12/19/2017   Procedure: PORTA CATH INSERTION;  Surgeon: Dew, Jason S, MD;  Location: [facility] INVASIVE CV LAB;   Service: Cardiovascular;  Laterality: N/A;   TOOTH EXTRACTION  05/28/2020   upper left and it was molars   URETERAL BIOPSY Left 12/10/2017   Procedure: URETERAL & renal PELVIS BIOPSY;  Surgeon: Brandon, Ashley, MD;  Location: [facility] ORS;  Service: Urology;  Laterality: Left;   URETEROSCOPY Left 12/10/2017   Procedure: URETEROSCOPY;  Surgeon: Brandon, Ashley, MD;  Location: [facility] ORS;  Service: Urology;  Laterality: Left;    FAMILY HISTORY: family history includes Cancer in his father; Emphysema in his maternal grandfather; Heart disease in his father and mother; Tuberculosis in his maternal grandmother.  SOCIAL HISTORY:  reports that he quit smoking about 42 years ago. His smoking use included cigars. He quit smokeless tobacco use about 32 years ago.  His smokeless tobacco use included chew. He reports previous alcohol use. He reports that he does not use drugs.  ALLERGIES: Sulfa antibiotics, Ace inhibitors, Invokana [canagliflozin], and Penicillins  MEDICATIONS:  Current Outpatient Medications  Medication Sig Dispense Refill   aspirin EC 81 MG tablet Take 81 mg by mouth every evening.      atorvastatin (LIPITOR) 40 MG tablet TAKE 1 TABLET BY MOUTH AT BEDTIME 90 tablet 0   blood glucose meter kit and supplies Dispense based on patient and insurance preference. Use once daily as directed. (FOR ICD-10 E11.9). 1 each 0   Cholecalciferol (VITAMIN D3) 25 MCG (1000 UT) CAPS Take 1 capsule by mouth daily.     empagliflozin (JARDIANCE) 10 MG TABS tablet Take 1 tablet (10 mg total) by mouth daily. 90 tablet 2   glucose blood (ONETOUCH ULTRA) test strip Use as instructed 100 each 12   Lancets (ONETOUCH ULTRASOFT) lancets Use as instructed 100 each 12   losartan (COZAAR) 50 MG tablet TAKE 1 TABLET BY MOUTH ONCE DAILY (SCHEDULE  OFFICE  VISIT  FOR  FURTHER  REFILLS) 90 tablet 0   magnesium hydroxide (MILK OF MAGNESIA) 400 MG/5ML suspension Take 15 mLs by mouth daily as needed for mild constipation.      metFORMIN (GLUCOPHAGE) 1000 MG tablet TAKE 1 TABLET BY MOUTH TWICE DAILY WITH MEALS 180 tablet 0   nystatin cream (MYCOSTATIN) Apply topically 2 (two) times daily. 15 g 1   OVER THE COUNTER MEDICATION Apply 1 application topically daily as needed (pain). Outback topical pain relief     pregabalin (LYRICA) 75 MG capsule Take 1 capsule by mouth once daily 90 capsule 0   psyllium (METAMUCIL) 58.6 % powder Take 1 packet by mouth daily.     sertraline (ZOLOFT) 50 MG tablet Take 1 tablet (50 mg total) by mouth daily. 90 tablet 3   vitamin B-12 (CYANOCOBALAMIN) 1000 MCG tablet Take by mouth daily. Unsure dose     vitamin C (ASCORBIC ACID) 500 MG tablet Take 1,000-1,500 mg by mouth 2 (two) times daily.     Blood Glucose Monitoring Suppl (ONE TOUCH ULTRA 2) w/Device KIT 1 each by Does not apply route once for 1 dose. Test blood sugars once daily 1 kit 0   clobetasol cream (TEMOVATE) 0.05 % Apply 1 application topically as needed (for skin rash). (Patient not taking: No sig reported) 30 g 1   fluticasone (FLONASE) 50 MCG/ACT nasal spray Use 2 spray(s) in each nostril once daily (Patient not taking: No sig reported) 48 g 3   hydrOXYzine (ATARAX/VISTARIL) 25 MG tablet Take 1   tablet (25 mg total) by mouth every 8 (eight) hours as needed for itching. (Patient not taking: No sig reported) 30 tablet 3   Ivermectin 1 % CREA Apply 1 application topically daily as needed (rosacea). To face (Patient not taking: No sig reported)     lidocaine-prilocaine (EMLA) cream Apply 1 application topically as needed (port access). (Patient not taking: No sig reported) 1 g 3   No current facility-administered medications for this encounter.   Facility-Administered Medications Ordered in Other Encounters  Medication Dose Route Frequency Provider Last Rate Last Admin   Influenza vac split quadrivalent PF (FLUZONE HIGH-DOSE) injection 0.5 mL  0.5 mL Intramuscular Once Gray, Bryan E, NP        ECOG PERFORMANCE STATUS:  0 -  Asymptomatic  REVIEW OF SYSTEMS: Patient does have peripheral neuropathy Patient denies any weight loss, fatigue, weakness, fever, chills or night sweats. Patient denies any loss of vision, blurred vision. Patient denies any ringing  of the ears or hearing loss. No irregular heartbeat. Patient denies heart murmur or history of fainting. Patient denies any chest pain or pain radiating to her upper extremities. Patient denies any shortness of breath, difficulty breathing at night, cough or hemoptysis. Patient denies any swelling in the lower legs. Patient denies any nausea vomiting, vomiting of blood, or coffee ground material in the vomitus. Patient denies any stomach pain. Patient states has had normal bowel movements no significant constipation or diarrhea. Patient denies any dysuria, hematuria or significant nocturia. Patient denies any problems walking, swelling in the joints or loss of balance. Patient denies any skin changes, loss of hair or loss of weight. Patient denies any excessive worrying or anxiety or significant depression. Patient denies any problems with insomnia. Patient denies excessive thirst, polyuria, polydipsia. Patient denies any swollen glands, patient denies easy bruising or easy bleeding. Patient denies any recent infections, allergies or URI. Patient "s visual fields have not changed significantly in recent time.   PHYSICAL EXAM: BP 113/87 (BP Location: Left Arm, Patient Position: Sitting)   Pulse 86   Temp (!) 94.8 F (34.9 C) (Tympanic)   Resp 14   Wt 152 lb 12.8 oz (69.3 kg)   BMI 23.23 kg/m  Frail-appearing elderly male in NAD.  Well-developed well-nourished patient in NAD. HEENT reveals PERLA, EOMI, discs not visualized.  Oral cavity is clear. No oral mucosal lesions are identified. Neck is clear without evidence of cervical or supraclavicular adenopathy. Lungs are clear to A&P. Cardiac examination is essentially unremarkable with regular rate and rhythm without murmur  rub or thrill. Abdomen is benign with no organomegaly or masses noted. Motor sensory and DTR levels are equal and symmetric in the upper and lower extremities. Cranial nerves II through XII are grossly intact. Proprioception is intact. No peripheral adenopathy or edema is identified. No motor or sensory levels are noted. Crude visual fields are within normal range.  LABORATORY DATA: Pathology report reviewed    RADIOLOGY RESULTS: CT scans reviewed MRI scan ordered   IMPRESSION: Progressive left lower pole urothelial carcinoma and 74-year-old male  PLAN: This time I have discussed with radiology better imaging studies for his left kidney and I am ordering an MRI of his abdomen for evaluation and treatment planning purposes.  I would propose SBRT to his left kidney treating 40 Gray in 5 fractions.  I would like MRI studies for better targeting of his left lower pole lesion.  I reviewed that with radiology and that was their suggestion.  MRI was ordered   I have set him up for CT simulation shortly thereafter.  Risks and benefits of treatment including mild fatigue l and other low side effect profile were reviewed with the patient and his wife.  They both comprehend my treatment plan well.  I would like to take this opportunity to thank you for allowing me to participate in the care of your patient.Noreene Filbert, MD

## 2021-01-23 ENCOUNTER — Other Ambulatory Visit: Payer: Self-pay

## 2021-01-23 ENCOUNTER — Ambulatory Visit
Admission: RE | Admit: 2021-01-23 | Discharge: 2021-01-23 | Disposition: A | Discharge status: Home / Self Care | Payer: HMO | Source: Ambulatory Visit | Attending: Radiation Oncology | Admitting: Radiation Oncology

## 2021-01-23 DIAGNOSIS — C642 Malignant neoplasm of left kidney, except renal pelvis: Secondary | ICD-10-CM

## 2021-01-23 MED ORDER — GADOBUTROL 1 MMOL/ML IV SOLN
7.0000 mL | Freq: Once | INTRAVENOUS | Status: AC | PRN
  Administered 2021-01-23: 7 mL via INTRAVENOUS

## 2021-01-24 ENCOUNTER — Inpatient Hospital Stay: Payer: HMO | Admitting: Hospice and Palliative Medicine

## 2021-01-25 ENCOUNTER — Ambulatory Visit: Payer: HMO

## 2021-01-25 DIAGNOSIS — C642 Malignant neoplasm of left kidney, except renal pelvis: Secondary | ICD-10-CM | POA: Insufficient documentation

## 2021-01-25 DIAGNOSIS — Z510 Encounter for antineoplastic radiation therapy: Secondary | ICD-10-CM | POA: Diagnosis present

## 2021-01-26 ENCOUNTER — Inpatient Hospital Stay: Payer: HMO | Attending: Hospice and Palliative Medicine | Admitting: Hospice and Palliative Medicine

## 2021-01-26 DIAGNOSIS — E860 Dehydration: Secondary | ICD-10-CM | POA: Insufficient documentation

## 2021-01-26 DIAGNOSIS — Z515 Encounter for palliative care: Secondary | ICD-10-CM

## 2021-01-26 DIAGNOSIS — R5383 Other fatigue: Secondary | ICD-10-CM | POA: Insufficient documentation

## 2021-01-26 DIAGNOSIS — C642 Malignant neoplasm of left kidney, except renal pelvis: Secondary | ICD-10-CM | POA: Diagnosis not present

## 2021-01-26 DIAGNOSIS — R531 Weakness: Secondary | ICD-10-CM | POA: Insufficient documentation

## 2021-01-26 DIAGNOSIS — Z5112 Encounter for antineoplastic immunotherapy: Secondary | ICD-10-CM | POA: Insufficient documentation

## 2021-01-26 DIAGNOSIS — C778 Secondary and unspecified malignant neoplasm of lymph nodes of multiple regions: Secondary | ICD-10-CM | POA: Insufficient documentation

## 2021-01-26 NOTE — Progress Notes (Signed)
Virtual Visit via Video Note  I connected with Patrick Mcgee on 01/26/21 at 11:30 AM EDT by a video enabled telemedicine application and verified that I am speaking with the correct person using two identifiers.   I discussed the limitations of evaluation and management by telemedicine and the availability of in person appointments. The patient expressed understanding and agreed to proceed.  History of Present Illness: [name] is a 75 y.o. 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  PMH is also notable for mild interstitial lung disease, diabetes, and CAD status post previous stenting.  Patient has been on maintenance Enfortumab.  Patient was referred to palliative care to help address goals and manage ongoing symptoms.   Observations/Objective: Patient reports that overall he is doing well.    He denies any specific changes or concerns today.  No symptomatic complaints at present.  Patient continues to endorse chronically poor appetite but denies recent weight loss.  He is drinking oral nutritional supplements and is trying to drink a protein smoothie daily.  Discussed high-calorie/high-protein foods.  Patient is optimistic regarding treatment.  He reports being told that upcoming radiation treatments would be associated with prolongation of life.    Assessment and Plan: Stage IV urothelial cancer -on treatment with enfortumab.  Seems to be symptomatically stable.  Followed by Dr. Janese Banks   Follow Up Instructions: Follow-up MyChart visit in 2 to 3 months   I discussed the assessment and treatment plan with the patient. The patient was provided an opportunity to ask questions and all were answered. The patient agreed with the plan and demonstrated an understanding of the instructions.   The patient was advised to call back or seek an in-person evaluation if the symptoms  worsen or if the condition fails to improve as anticipated.  I provided 10 minutes of non-face-to-face time during this encounter.   Irean Hong, NP

## 2021-01-28 DIAGNOSIS — Z510 Encounter for antineoplastic radiation therapy: Secondary | ICD-10-CM | POA: Diagnosis not present

## 2021-01-31 ENCOUNTER — Encounter: Payer: Self-pay | Admitting: Oncology

## 2021-01-31 ENCOUNTER — Inpatient Hospital Stay (HOSPITAL_BASED_OUTPATIENT_CLINIC_OR_DEPARTMENT_OTHER): Payer: HMO | Admitting: Oncology

## 2021-01-31 ENCOUNTER — Inpatient Hospital Stay: Payer: HMO

## 2021-01-31 VITALS — BP 118/60 | HR 81 | Temp 97.5°F | Resp 16 | Ht 68.0 in | Wt 153.0 lb

## 2021-01-31 DIAGNOSIS — D701 Agranulocytosis secondary to cancer chemotherapy: Secondary | ICD-10-CM

## 2021-01-31 DIAGNOSIS — Z5112 Encounter for antineoplastic immunotherapy: Secondary | ICD-10-CM | POA: Diagnosis present

## 2021-01-31 DIAGNOSIS — T451X5A Adverse effect of antineoplastic and immunosuppressive drugs, initial encounter: Secondary | ICD-10-CM

## 2021-01-31 DIAGNOSIS — C778 Secondary and unspecified malignant neoplasm of lymph nodes of multiple regions: Secondary | ICD-10-CM | POA: Diagnosis not present

## 2021-01-31 DIAGNOSIS — C642 Malignant neoplasm of left kidney, except renal pelvis: Secondary | ICD-10-CM

## 2021-01-31 DIAGNOSIS — Z5111 Encounter for antineoplastic chemotherapy: Secondary | ICD-10-CM

## 2021-01-31 DIAGNOSIS — R531 Weakness: Secondary | ICD-10-CM | POA: Diagnosis not present

## 2021-01-31 DIAGNOSIS — R5383 Other fatigue: Secondary | ICD-10-CM | POA: Diagnosis not present

## 2021-01-31 DIAGNOSIS — E860 Dehydration: Secondary | ICD-10-CM | POA: Diagnosis not present

## 2021-01-31 LAB — COMPREHENSIVE METABOLIC PANEL
ALT: 9 U/L (ref 0–44)
AST: 14 U/L — ABNORMAL LOW (ref 15–41)
Albumin: 3.7 g/dL (ref 3.5–5.0)
Alkaline Phosphatase: 95 U/L (ref 38–126)
Anion gap: 10 (ref 5–15)
BUN: 25 mg/dL — ABNORMAL HIGH (ref 8–23)
CO2: 24 mmol/L (ref 22–32)
Calcium: 9.4 mg/dL (ref 8.9–10.3)
Chloride: 99 mmol/L (ref 98–111)
Creatinine, Ser: 0.93 mg/dL (ref 0.61–1.24)
GFR, Estimated: >60 mL/min (ref >60)
Glucose, Bld: 120 mg/dL — ABNORMAL HIGH (ref 70–99)
Potassium: 4 mmol/L (ref 3.5–5.1)
Sodium: 133 mmol/L — ABNORMAL LOW (ref 135–145)
Total Bilirubin: 1.2 mg/dL (ref 0.3–1.2)
Total Protein: 7.5 g/dL (ref 6.5–8.1)

## 2021-01-31 LAB — CBC WITH DIFFERENTIAL/PLATELET
Abs Immature Granulocytes: 0.02 10*3/uL (ref 0.00–0.07)
Basophils Absolute: 0.1 10*3/uL (ref 0.0–0.1)
Basophils Relative: 1 %
Eosinophils Absolute: 0.3 10*3/uL (ref 0.0–0.5)
Eosinophils Relative: 3 %
HCT: 34.2 % — ABNORMAL LOW (ref 39.0–52.0)
Hemoglobin: 11.4 g/dL — ABNORMAL LOW (ref 13.0–17.0)
Immature Granulocytes: 0 %
Lymphocytes Relative: 12 %
Lymphs Abs: 1.1 10*3/uL (ref 0.7–4.0)
MCH: 28.4 pg (ref 26.0–34.0)
MCHC: 33.3 g/dL (ref 30.0–36.0)
MCV: 85.1 fL (ref 80.0–100.0)
Monocytes Absolute: 0.9 10*3/uL (ref 0.1–1.0)
Monocytes Relative: 9 %
Neutro Abs: 7.3 10*3/uL (ref 1.7–7.7)
Neutrophils Relative %: 75 %
Platelets: 255 10*3/uL (ref 150–400)
RBC: 4.02 MIL/uL — ABNORMAL LOW (ref 4.22–5.81)
RDW: 14.8 % (ref 11.5–15.5)
WBC: 9.6 10*3/uL (ref 4.0–10.5)
nRBC: 0 % (ref 0.0–0.2)

## 2021-01-31 MED ORDER — HEPARIN SOD (PORK) LOCK FLUSH 100 UNIT/ML IV SOLN
500.0000 [IU] | Freq: Once | INTRAVENOUS | Status: AC | PRN
Start: 2021-01-31 — End: 2021-01-31
  Administered 2021-01-31: 500 [IU]
  Filled 2021-01-31: qty 5

## 2021-01-31 MED ORDER — SODIUM CHLORIDE 0.9 % IV SOLN
1.0000 mg/kg | Freq: Once | INTRAVENOUS | Status: AC
  Administered 2021-01-31: 70 mg via INTRAVENOUS
  Filled 2021-01-31: qty 3

## 2021-01-31 MED ORDER — HEPARIN SOD (PORK) LOCK FLUSH 100 UNIT/ML IV SOLN
INTRAVENOUS | Status: AC
  Filled 2021-01-31: qty 5

## 2021-01-31 MED ORDER — PALONOSETRON HCL INJECTION 0.25 MG/5ML
0.2500 mg | Freq: Once | INTRAVENOUS | Status: AC
  Administered 2021-01-31: 0.25 mg via INTRAVENOUS
  Filled 2021-01-31: qty 5

## 2021-01-31 MED ORDER — SODIUM CHLORIDE 0.9 % IV SOLN
Freq: Once | INTRAVENOUS | Status: AC
  Filled 2021-01-31: qty 250

## 2021-01-31 NOTE — Progress Notes (Signed)
Pt trying to get up and not sleep as much, he is drinking ensure still and trying to put whey powder in certain drinks to help build up. He is fatigued from a long day of activities he was doing

## 2021-01-31 NOTE — Progress Notes (Signed)
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 [name] so far for his metastatic urothelial carcinoma.  This was originally diagnosed in May 2019.  He was noted to have a filling defect in the lower pole collecting system of the left kidney along with para-aortic and retroperitoneal adenopathy concerning for metastatic disease.  He underwent left ureteroscopy and renal pelvis biopsy which revealed small fragments of high-grade urothelial carcinoma with small focus of invasion.  He was started on carboplatin and gemcitabine chemotherapy in June 2019 and was continued on 05/27/2018 for 8 cycles.  He tolerated chemotherapy well except for chemo-induced anemia for which she has been getting Procrit every 2 weeks.  He did have response to his disease based on scans in August 2019.  However repeat scan on 05/31/2018 showed increase in the size of the primary tumor from 1.2 to 1.9 cm and increase in left para-aortic adenopathy from 0.9 to 1.3 cm.  Periportal adenopathy was stable at 1.4 cm.  Second line immunotherapy was recommended.  His initial biopsy specimen did not have enough sample to undergo FGFR mutation testing.  He also has mild interstitial lung disease for which he sees  pulmonary but he is not on home oxygen.  He has B12 deficiency for which he is on oral B12.  Also has diabetes and coronary artery disease.  Tecentriq started on 06/17/2018.  Patient noted to have disease progression in his lymph nodes in May 2020.  Repeat biopsy showed metastatic urothelial carcinoma which did not have a FGFR mutation.  He has been started on third line Padcev   Treatment on hold since April 2021 due to neuropathy and to maintain quality of life but restarted in August 2021 after he was found to have progression of disease which he has 1 week on 3 weeks off  CT scan in June 2022 showed increase in the size of primary urothelial mass but no other evidence of metastatic disease elsewhere.  He will be receiving palliative radiation to the mass  Interval history-reports that he is at his baseline state of health.  Appetite is fair and he mostly naps during the day and tends to be awake at nights.  Peripheral neuropathy is stable  ECOG PS- 1 Pain scale- 0   Review of systems- Review of Systems  Constitutional:  Positive for malaise/fatigue. Negative for chills, fever and weight loss.  HENT:  Negative for congestion, ear discharge and nosebleeds.   Eyes:  Negative for blurred vision.  Respiratory:  Negative for cough, hemoptysis, sputum production, shortness of breath and wheezing.   Cardiovascular:  Negative for chest pain, palpitations, orthopnea and claudication.  Gastrointestinal:  Negative for abdominal pain, blood in stool, constipation, diarrhea, heartburn, melena, nausea and vomiting.  Genitourinary:  Negative for dysuria, flank pain, frequency, hematuria and urgency.  Musculoskeletal:  Negative for back pain, joint  pain and myalgias.  Skin:  Negative for rash.  Neurological:  Negative for dizziness, tingling, focal weakness, seizures, weakness and headaches.  Endo/Heme/Allergies:  Does not bruise/bleed easily.  Psychiatric/Behavioral:  Negative for depression and suicidal  ideas. The patient does not have insomnia.      Allergies  Allergen Reactions   Sulfa Antibiotics Other (See Comments)    Joint pain   Ace Inhibitors Cough   Invokana [Canagliflozin] Other (See Comments)    Leg pain   Penicillins Rash    Did it involve swelling of the face/tongue/throat, SOB, or low BP? No Did it involve sudden or severe rash/hives, skin peeling, or any reaction on the inside of your mouth or nose? Yes Did you need to seek medical attention at a hospital or doctor's office? Yes When did it last happen?      childhood allergy If all above answers are "NO", may proceed with cephalosporin use.      Past Medical History:  Diagnosis Date   Acid reflux    Anxiety    Depression    Diabetes mellitus without complication ([place])    Hyperlipidemia    Hypertension    MRSA (methicillin resistant Staphylococcus aureus) infection 1992   history   Myocardial infarction ([place]) 1995   Sleep apnea    CPAP   Urothelial cancer ([place]) 11/2017   Left Urothelial mass, chemo tx's     Past Surgical History:  Procedure Laterality Date   CATARACT EXTRACTION Left    COLONOSCOPY  2010   [facility]   COLONOSCOPY WITH PROPOFOL N/A 10/04/2016   Procedure: COLONOSCOPY WITH PROPOFOL;  Surgeon: [name], MD;  Location: [place];  Service: Endoscopy;  Laterality: N/A;   [place], 2000, 2001, 2014   CYSTOSCOPY W/ RETROGRADES Bilateral 12/10/2017   Procedure: CYSTOSCOPY WITH RETROGRADE PYELOGRAM;  Surgeon: [name], MD;  Location: [facility];  Service: Urology;  Laterality: Bilateral;   CYSTOSCOPY W/ RETROGRADES Left 12/09/2018   Procedure: CYSTOSCOPY WITH RETROGRADE PYELOGRAM;  Surgeon: [name], MD;  Location: [facility];  Service: Urology;  Laterality: Left;   CYSTOSCOPY WITH BIOPSY Left 12/09/2018   Procedure: CYSTOSCOPY WITH URETERAL/RENAL PELVIC BIOPSY;  Surgeon: [name], MD;  Location: [facility];  Service: Urology;   Laterality: Left;   CYSTOSCOPY WITH STENT PLACEMENT Left 12/10/2017   Procedure: CYSTOSCOPY WITH STENT PLACEMENT;  Surgeon: [name], MD;  Location: [facility];  Service: Urology;  Laterality: Left;   CYSTOSCOPY WITH STENT PLACEMENT Left 12/09/2018   Procedure: CYSTOSCOPY WITH STENT PLACEMENT;  Surgeon: [name], MD;  Location: [facility];  Service: Urology;  Laterality: Left;   CYSTOSCOPY WITH URETEROSCOPY Left 12/09/2018   Procedure: CYSTOSCOPY WITH URETEROSCOPY;  Surgeon: [name], MD;  Location: [facility];  Service: Urology;  Laterality: Left;   EYE SURGERY Bilateral    cataract   INGUINAL HERNIA REPAIR Right 08/09/2015   Procedure: HERNIA REPAIR INGUINAL ADULT;  Surgeon: [name], MD;  Location: [facility];  Service: General;  Laterality: Right;   NASAL SINUS SURGERY     nuclear stress test     PORTA CATH INSERTION N/A 12/19/2017   Procedure: PORTA CATH INSERTION;  Surgeon: [name], MD;  Location: [place] [facility];  Service: Cardiovascular;  Laterality: N/A;   TOOTH EXTRACTION  05/28/2020   upper left and it was molars   URETERAL BIOPSY Left 12/10/2017   Procedure: URETERAL & renal PELVIS BIOPSY;  Surgeon: [name], MD;  Location: [facility];  Service: Urology;  Laterality: Left;   URETEROSCOPY Left 12/10/2017   Procedure: URETEROSCOPY;  Surgeon: [name], MD;  Location: [facility];  Service: Urology;  Laterality: Left;    Social History   Socioeconomic History   Marital status: Married    Spouse name: [name]   Number of children: 1   Years of education: Not on file   Highest education level: Associate degree: occupational, Hotel manager, or vocational program  Occupational History   Occupation: retired  Tobacco Use   Smoking status: Former    Types: Cigars    Quit date: 10/09/1978    Years since quitting: 42.3   Smokeless tobacco: Former    Types: Chew    Quit date: 12/04/1988   Tobacco comments:    on occasion  Vaping Use   Vaping Use: Never  used  Substance and Sexual Activity   Alcohol use: Yes    Comment: beer once a while   Drug use: No   Sexual activity: Not Currently  Other Topics Concern   Not on file  Social History Narrative   Not on file   Social Determinants of Health   Financial Resource Strain: Low Risk    Difficulty of Paying Living Expenses: Not hard at all  Food Insecurity: No Food Insecurity   Worried About Charity fundraiser in the Last Year: Never true   [place] in the Last Year: Never true  Transportation Needs: No Transportation Needs   Lack of Transportation (Medical): No   Lack of Transportation (Non-Medical): No  Physical Activity: Insufficiently Active   Days of Exercise per Week: 3 days   Minutes of Exercise per Session: 30 min  Stress: No Stress Concern Present   Feeling of Stress : Not at all  Social Connections: Moderately Integrated   Frequency of Communication with Friends and Family: Three times a week   Frequency of Social Gatherings with Friends and Family: More than three times a week   Attends Religious Services: More than 4 times per year   Active Member of [employer] or Organizations: No   Attends Music therapist: Never   Marital Status: Married  Human resources officer Violence: Not At Risk   Fear of Current or Ex-Partner: No   Emotionally Abused: No   Physically Abused: No   Sexually Abused: No    Family History  Problem Relation Age of Onset   Heart disease Mother    Cancer Father        Lung and colon cancer   Heart disease Father    Emphysema Maternal Grandfather    Tuberculosis Maternal Grandmother      Current Outpatient Medications:    aspirin EC 81 MG tablet, Take 81 mg by mouth every evening. , Disp: , Rfl:    atorvastatin (LIPITOR) 40 MG tablet, TAKE 1 TABLET BY MOUTH AT BEDTIME, Disp: 90 tablet, Rfl: 0   blood glucose meter kit and supplies, Dispense based on patient and insurance preference. Use once daily as directed. (FOR ICD-10 E11.9).,  Disp: 1 each, Rfl: 0   Blood Glucose Monitoring Suppl (ONE TOUCH ULTRA 2) w/Device KIT, 1 each by Does not apply route once for 1 dose. Test blood sugars once daily, Disp: 1 kit, Rfl: 0   Cholecalciferol (VITAMIN D3) 25 MCG (1000 UT) CAPS, Take 1 capsule by mouth daily., Disp: , Rfl:    clobetasol cream (TEMOVATE) 3.75 %, Apply 1 application topically as needed (for skin rash).,  Disp: 30 g, Rfl: 1   empagliflozin (JARDIANCE) 10 MG TABS tablet, Take 1 tablet (10 mg total) by mouth daily., Disp: 90 tablet, Rfl: 2   fluticasone (FLONASE) 50 MCG/ACT nasal spray, Use 2 spray(s) in each nostril once daily, Disp: 48 g, Rfl: 3   glucose blood (ONETOUCH ULTRA) test strip, Use as instructed, Disp: 100 each, Rfl: 12   Ivermectin 1 % CREA, Apply 1 application topically daily as needed (rosacea). To face, Disp: , Rfl:    Lancets (ONETOUCH ULTRASOFT) lancets, Use as instructed, Disp: 100 each, Rfl: 12   losartan (COZAAR) 50 MG tablet, TAKE 1 TABLET BY MOUTH ONCE DAILY (SCHEDULE  OFFICE  VISIT  FOR  FURTHER  REFILLS), Disp: 90 tablet, Rfl: 0   magnesium hydroxide (MILK OF MAGNESIA) 400 MG/5ML suspension, Take 15 mLs by mouth daily as needed for mild constipation., Disp: , Rfl:    metFORMIN (GLUCOPHAGE) 1000 MG tablet, TAKE 1 TABLET BY MOUTH TWICE DAILY WITH MEALS, Disp: 180 tablet, Rfl: 0   nystatin cream (MYCOSTATIN), Apply topically 2 (two) times daily., Disp: 15 g, Rfl: 1   OVER THE COUNTER MEDICATION, Apply 1 application topically daily as needed (pain). Outback topical pain relief, Disp: , Rfl:    pregabalin (LYRICA) 75 MG capsule, Take 1 capsule by mouth once daily, Disp: 90 capsule, Rfl: 0   psyllium (METAMUCIL) 58.6 % powder, Take 1 packet by mouth daily., Disp: , Rfl:    sertraline (ZOLOFT) 50 MG tablet, Take 1 tablet (50 mg total) by mouth daily., Disp: 90 tablet, Rfl: 3   vitamin B-12 (CYANOCOBALAMIN) 1000 MCG tablet, Take by mouth daily. Unsure dose, Disp: , Rfl:    hydrOXYzine (ATARAX/VISTARIL) 25 MG  tablet, Take 1 tablet (25 mg total) by mouth every 8 (eight) hours as needed for itching. (Patient not taking: No sig reported), Disp: 30 tablet, Rfl: 3   lidocaine-prilocaine (EMLA) cream, Apply 1 application topically as needed (port access). (Patient not taking: Reported on 01/31/2021), Disp: 1 g, Rfl: 3   vitamin C (ASCORBIC ACID) 500 MG tablet, Take 1,000-1,500 mg by mouth 2 (two) times daily. (Patient not taking: Reported on 01/31/2021), Disp: , Rfl:  No current facility-administered medications for this visit.  Facility-Administered Medications Ordered in Other Visits:    enfortumab vedotin-ejfv (PADCEV) 70 mg in sodium chloride 0.9 % 50 mL (1.2281 mg/mL) chemo infusion, 1 mg/kg (Treatment Plan Recorded), Intravenous, Once, Sindy Guadeloupe, MD, Last Rate: 114 mL/hr at 01/31/21 1111, 70 mg at 01/31/21 1111   heparin lock flush 100 unit/mL, 500 Units, Intracatheter, Once PRN, Sindy Guadeloupe, MD   Influenza vac split quadrivalent PF (FLUZONE HIGH-DOSE) injection 0.5 mL, 0.5 mL, Intramuscular, Once, Karen Kitchens, NP  Physical exam:  Vitals:   01/31/21 0955  BP: 118/60  Pulse: 81  Resp: 16  Temp: (!) 97.5 F (36.4 C)  TempSrc: Tympanic  Weight: 153 lb (69.4 kg)  Height: _0  (1.727 m)   Physical Exam Constitutional:      General: He is not in acute distress. Cardiovascular:     Rate and Rhythm: Normal rate and regular rhythm.     Heart sounds: Normal heart sounds.  Pulmonary:     Effort: Pulmonary effort is normal.     Breath sounds: Normal breath sounds.  Skin:    General: Skin is warm and dry.  Neurological:     Mental Status: He is alert and oriented to person, place, and time.     CMP Latest  Ref Rng & Units 01/31/2021  Glucose 70 - 99 mg/dL 120(H)  BUN 8 - 23 mg/dL 25(H)  Creatinine 0.61 - 1.24 mg/dL 0.93  Sodium 135 - 145 mmol/L 133(L)  Potassium 3.5 - 5.1 mmol/L 4.0  Chloride 98 - 111 mmol/L 99  CO2 22 - 32 mmol/L 24  Calcium 8.9 - 10.3 mg/dL 9.4  Total Protein 6.5  - 8.1 g/dL 7.5  Total Bilirubin 0.3 - 1.2 mg/dL 1.2  Alkaline Phos 38 - 126 U/L 95  AST 15 - 41 U/L 14(L)  ALT 0 - 44 U/L 9   CBC Latest Ref Rng & Units 01/31/2021  WBC 4.0 - 10.5 K/uL 9.6  Hemoglobin 13.0 - 17.0 g/dL 11.4(L)  Hematocrit 39.0 - 52.0 % 34.2(L)  Platelets 150 - 400 K/uL 255    No images are attached to the encounter.  MR Abdomen W Wo Contrast  Result Date: 01/24/2021 CLINICAL DATA:  Urothelial cancer with recurrent tumor in the left renal pelvis and lower pole collecting system on CT 1 month ago EXAM: MRI ABDOMEN WITHOUT AND WITH CONTRAST TECHNIQUE: Multiplanar multisequence MR imaging of the abdomen was performed both before and after the administration of intravenous contrast. CONTRAST:  25m GADAVIST GADOBUTROL 1 MMOL/ML IV SOLN COMPARISON:  Multiple exams, including 12/20/2020 FINDINGS: Lower chest: Metal artifact favoring stents in the left anterior descending and right coronary arteries. Hepatobiliary: Small gallstone in the gallbladder on image 14 series 4. No biliary dilatation. No significant abnormal focal lesion to suggest metastatic disease to the liver. Pancreas:  Atrophic pancreas Spleen:  Unremarkable Adrenals/Urinary Tract: Both adrenal glands appear normal. Bilateral renal cysts are present. Substantial enhancing material compatible with tumor in the central collecting system and lower pole collecting system with wall thickening and enhancement of the proximal ureter. Abnormal reticular hypoenhancement in the medullary space of the left kidney lower pole along with some mild cortical hypoenhancement as on recent CT, this could be related to obstruction or tumor infiltration. Stomach/Bowel: As before, there is narrowing in the gastric antrum, which could be therapy related. Scattered diverticula of the descending colon. Vascular/Lymphatic: Indistinct left renal vein tributaries on the left, cannot completely exclude the possibility of renal vein thrombosis along the  renal hilum, but otherwise the renal vein and IVC appear normal. No pathologic adenopathy identified. Other:  Low-level perirenal edema bilaterally, likely chronic. Musculoskeletal: Unremarkable IMPRESSION: 1. Tumor in the left renal pelvis, left kidney lower pole collecting system, potentially in the proximal left ureter, and potentially infiltrating the left kidney lower pole which demonstrates heterogeneous enhancement. This is similar to the recent CT scan. I do not see overtly pathologic adenopathy at this time. Indistinctness of the left renal vein along the left renal hilum which is indeterminate for tumor thrombus, but no filling defect more medially in the renal vein is observed. 2. Cholelithiasis. 3. Coronary artery stents. 4. Bilateral renal cysts. 5. Narrowing of the gastric antrum unchanged. Electronically Signed   By: WVan ClinesM.D.   On: 01/24/2021 14:30     Assessment and plan- Patient is a 75y.o. 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  He is here for on treatment assessment prior to next cycle of Padcev  Counts and blood sugars as well as LFTs are acceptable to proceed with Padcev today.  He has been getting this 1 week on and 3 weeks off to maintain his quality of life and peripheral neuropathy which we will continue.  Patient will  start palliative radiation treatment 5 fractions later this week.  I will see him in 4 weeks for next cycle of #7 repeat scans sometime in September 2022   Visit Diagnosis 1. Encounter for antineoplastic chemotherapy   2. Urothelial carcinoma of kidney, left (HCC)      Dr. Randa Evens, MD, MPH [facility] at [facility] [phone] 01/31/2021 11:14 AM

## 2021-01-31 NOTE — Patient Instructions (Signed)
C[facility]ER CENTER [place] [facility]   Discharge Instructions: Thank you for choosing [facility] to provide your oncology and hematology care.  If you have a lab appointment with the Cancer Center, please go directly to the Cancer Center and check in at the registration area.  Wear comfortable clothing and clothing appropriate for easy access to any Portacath or PICC line.   We strive to give you quality time with your provider. You may need to reschedule your appointment if you arrive late (15 or more minutes).  Arriving late affects you and other patients whose appointments are after yours.  Also, if you miss three or more appointments without notifying the office, you may be dismissed from the clinic at the provider's discretion.      For prescription refill requests, have your pharmacy contact our office and allow 72 hours for refills to be completed.    Today you received the following chemotherapy and/or immunotherapy agents: Padcev.      To help prevent nausea and vomiting after your treatment, we encourage you to take your nausea medication as directed.  BELOW ARE SYMPTOMS THAT SHOULD BE REPORTED IMMEDIATELY: *FEVER GREATER THAN 100.4 F (38 C) OR HIGHER *CHILLS OR SWEATING *NAUSEA AND VOMITING THAT IS NOT CONTROLLED WITH YOUR NAUSEA MEDICATION *UNUSUAL SHORTNESS OF BREATH *UNUSUAL BRUISING OR BLEEDING *URINARY PROBLEMS (pain or burning when urinating, or frequent urination) *BOWEL PROBLEMS (unusual diarrhea, constipation, pain near the anus) TENDERNESS IN MOUTH AND THROAT WITH OR WITHOUT PRESENCE OF ULCERS (sore throat, sores in mouth, or a toothache) UNUSUAL RASH, SWELLING OR PAIN  UNUSUAL VAGINAL DISCHARGE OR ITCHING   Items with * indicate a potential emergency and should be followed up as soon as possible or go to the Emergency Department if any problems should occur.  Please show the CHEMOTHERAPY ALERT CARD or IMMUNOTHERAPY ALERT CARD at check-in  to the Emergency Department and triage nurse.  Should you have questions after your visit or need to cancel or reschedule your appointment, please contact CANCER CENTER [place] [facility]  [phone] and follow the prompts.  Office hours are 8:00 a.m. to 4:30 p.m. Monday - Friday. Please note that voicemails left after 4:00 p.m. may not be returned until the following business day.  We are closed weekends and major holidays. You have access to a nurse at all times for urgent questions. Please call the main number to the clinic [phone] and follow the prompts.  For any non-urgent questions, you may also contact your provider using MyChart. We now offer e-Visits for anyone 18 and older to request care online for non-urgent symptoms. For details visit [email or web address].   Also download the MyChart app! Go to the app store, search "MyChart", open the app, select [place], and log in with your MyChart username and password.  Due to Covid, a mask is required upon entering the hospital/clinic. If you do not have a mask, one will be given to you upon arrival. For doctor visits, patients may have 1 support person aged 18 or older with them. For treatment visits, patients cannot have anyone with them due to current Covid guidelines and our immunocompromised population.  

## 2021-02-02 ENCOUNTER — Ambulatory Visit
Admission: RE | Admit: 2021-02-02 | Discharge: 2021-02-02 | Disposition: A | Discharge status: Home / Self Care | Payer: HMO | Source: Ambulatory Visit | Attending: Radiation Oncology | Admitting: Radiation Oncology

## 2021-02-02 DIAGNOSIS — Z510 Encounter for antineoplastic radiation therapy: Secondary | ICD-10-CM | POA: Diagnosis not present

## 2021-02-03 ENCOUNTER — Other Ambulatory Visit: Payer: Self-pay

## 2021-02-03 ENCOUNTER — Telehealth: Payer: Self-pay | Admitting: *Deleted

## 2021-02-03 DIAGNOSIS — C642 Malignant neoplasm of left kidney, except renal pelvis: Secondary | ICD-10-CM

## 2021-02-03 NOTE — Telephone Encounter (Signed)
Patrick Mcgee had called complaining of severe weakness after radiation yesterday. Dr. Baruch Gouty did not believe this was related to radiation.   Messaged Dr. Janese Banks and she suggested possibly being evaluated by [facility].   Patrick Mcgee agreed to come in tomorrow.  Appt. For 9:30 Friday morning given to Patrick Mcgee with Merrily Pew in [facility].

## 2021-02-04 ENCOUNTER — Inpatient Hospital Stay (HOSPITAL_BASED_OUTPATIENT_CLINIC_OR_DEPARTMENT_OTHER): Payer: HMO | Admitting: Hospice and Palliative Medicine

## 2021-02-04 ENCOUNTER — Other Ambulatory Visit: Payer: Self-pay

## 2021-02-04 ENCOUNTER — Ambulatory Visit
Admission: RE | Admit: 2021-02-04 | Discharge: 2021-02-04 | Disposition: A | Discharge status: Home / Self Care | Payer: HMO | Source: Ambulatory Visit | Attending: Radiation Oncology | Admitting: Radiation Oncology

## 2021-02-04 ENCOUNTER — Inpatient Hospital Stay: Payer: HMO

## 2021-02-04 VITALS — BP 90/60 | HR 89 | Temp 97.4°F | Resp 18

## 2021-02-04 VITALS — BP 110/70

## 2021-02-04 DIAGNOSIS — E860 Dehydration: Secondary | ICD-10-CM

## 2021-02-04 DIAGNOSIS — Z510 Encounter for antineoplastic radiation therapy: Secondary | ICD-10-CM | POA: Diagnosis not present

## 2021-02-04 DIAGNOSIS — C642 Malignant neoplasm of left kidney, except renal pelvis: Secondary | ICD-10-CM

## 2021-02-04 DIAGNOSIS — R531 Weakness: Secondary | ICD-10-CM

## 2021-02-04 DIAGNOSIS — Z5112 Encounter for antineoplastic immunotherapy: Secondary | ICD-10-CM | POA: Diagnosis not present

## 2021-02-04 DIAGNOSIS — Z95828 Presence of other vascular implants and grafts: Secondary | ICD-10-CM

## 2021-02-04 LAB — BASIC METABOLIC PANEL
Anion gap: 11 (ref 5–15)
BUN: 25 mg/dL — ABNORMAL HIGH (ref 8–23)
CO2: 26 mmol/L (ref 22–32)
Calcium: 9.6 mg/dL (ref 8.9–10.3)
Chloride: 96 mmol/L — ABNORMAL LOW (ref 98–111)
Creatinine, Ser: 0.91 mg/dL (ref 0.61–1.24)
GFR, Estimated: >60 mL/min (ref >60)
Glucose, Bld: 208 mg/dL — ABNORMAL HIGH (ref 70–99)
Potassium: 4.1 mmol/L (ref 3.5–5.1)
Sodium: 133 mmol/L — ABNORMAL LOW (ref 135–145)

## 2021-02-04 MED ORDER — HEPARIN SOD (PORK) LOCK FLUSH 100 UNIT/ML IV SOLN
500.0000 [IU] | Freq: Once | INTRAVENOUS | Status: AC
  Administered 2021-02-04: 500 [IU] via INTRAVENOUS
  Filled 2021-02-04: qty 5

## 2021-02-04 MED ORDER — SODIUM CHLORIDE 0.9 % IV SOLN
INTRAVENOUS | Status: DC
  Filled 2021-02-04: qty 250

## 2021-02-04 NOTE — Progress Notes (Signed)
Symptom Management [place]  [phone] [phone]  Patient Care Team: Jerrol Banana., MD as PCP - General (Family Medicine) Dingeldein, [name], MD as Consulting Physician (Ophthalmology) [name] as Consulting Physician (Internal Medicine) Sindy Guadeloupe, MD as Consulting Physician (Oncology) Jenefer Woerner, Kirt Boys, NP as Nurse Practitioner [facility] and Palliative Medicine)   Name of the patient: Patrick Mcgee  [id number]  28-Oct-1945   Date of visit: 02/04/21  Reason for Consult:  Patrick Mcgee is a 75 y.o. 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  PMH is also notable for mild interstitial lung disease, diabetes, and CAD status post previous stenting.  Patient has been on maintenance Padcev, which he last received on 01/31/2021.  Patient last saw Dr. Janese Banks on the same day at which time he was at his baseline state of health.  Patient presents to [facility] today for evaluation of fatigue and weakness.  Patient says that he felt significantly more weak/fatigued following his XRT treatment 2 days ago.  Yesterday, he felt weak to the point where he was having difficulty ambulating.  However, today he says he feels much better.  He was able to ambulate into the cancer center for his appointment today.  He denies fever or chills.  No respiratory or GI symptoms.  No urinary symptoms.  Of note, patient has been eating/drinking less over the past several days.  His wife has been caring for her elderly father and has been less able to monitor his oral intake.  Denies any neurologic complaints. Denies recent fevers or illnesses. Denies any easy bleeding or bruising. Reports good appetite and denies weight loss. Denies chest pain. Denies any nausea, vomiting, constipation, or diarrhea. Denies urinary complaints. Patient offers no  further specific complaints today.  PAST MEDICAL HISTORY: Past Medical History:  Diagnosis Date   Acid reflux    Anxiety    Depression    Diabetes mellitus without complication ([place])    Hyperlipidemia    Hypertension    MRSA (methicillin resistant Staphylococcus aureus) infection 1992   history   Myocardial infarction ([place]) 1995   Sleep apnea    CPAP   Urothelial cancer ([place]) 11/2017   Left Urothelial mass, chemo tx's    PAST SURGICAL HISTORY:  Past Surgical History:  Procedure Laterality Date   CATARACT EXTRACTION Left    COLONOSCOPY  2010   [facility]   COLONOSCOPY WITH PROPOFOL N/A 10/04/2016   Procedure: COLONOSCOPY WITH PROPOFOL;  Surgeon: [name], MD;  Location: [facility];  Service: Endoscopy;  Laterality: N/A;   [place], 2000, 2001, 2014   CYSTOSCOPY W/ RETROGRADES Bilateral 12/10/2017   Procedure: CYSTOSCOPY WITH RETROGRADE PYELOGRAM;  Surgeon: [name], MD;  Location: [facility];  Service: Urology;  Laterality: Bilateral;   CYSTOSCOPY W/ RETROGRADES Left 12/09/2018   Procedure: CYSTOSCOPY WITH RETROGRADE PYELOGRAM;  Surgeon: [name], MD;  Location: [facility];  Service: Urology;  Laterality: Left;   CYSTOSCOPY WITH BIOPSY Left 12/09/2018   Procedure: CYSTOSCOPY WITH URETERAL/RENAL PELVIC BIOPSY;  Surgeon: [name], MD;  Location: [facility];  Service: Urology;  Laterality: Left;   CYSTOSCOPY WITH STENT PLACEMENT Left 12/10/2017   Procedure: CYSTOSCOPY WITH STENT PLACEMENT;  Surgeon: [name], MD;  Location: [facility];  Service: Urology;  Laterality: Left;   CYSTOSCOPY WITH STENT PLACEMENT Left 12/09/2018   Procedure: CYSTOSCOPY WITH STENT PLACEMENT;  Surgeon: [name], MD;  Location: [facility];  Service: Urology;  Laterality: Left;   CYSTOSCOPY WITH URETEROSCOPY Left 12/09/2018   Procedure: CYSTOSCOPY WITH URETEROSCOPY;  Surgeon: [name], MD;  Location: [facility];  Service: Urology;   Laterality: Left;   EYE SURGERY Bilateral    cataract   INGUINAL HERNIA REPAIR Right 08/09/2015   Procedure: HERNIA REPAIR INGUINAL ADULT;  Surgeon: [name], MD;  Location: [facility];  Service: General;  Laterality: Right;   NASAL SINUS SURGERY     nuclear stress test     PORTA CATH INSERTION N/A 12/19/2017   Procedure: PORTA CATH INSERTION;  Surgeon: [name], MD;  Location: [place] [facility];  Service: Cardiovascular;  Laterality: N/A;   TOOTH EXTRACTION  05/28/2020   upper left and it was molars   URETERAL BIOPSY Left 12/10/2017   Procedure: URETERAL & renal PELVIS BIOPSY;  Surgeon: [name], MD;  Location: [facility];  Service: Urology;  Laterality: Left;   URETEROSCOPY Left 12/10/2017   Procedure: URETEROSCOPY;  Surgeon: [name], MD;  Location: [facility];  Service: Urology;  Laterality: Left;    HEMATOLOGY/ONCOLOGY HISTORY:  Oncology History Overview Note  Patrick Mcgee is a 75 y.o. 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  He presented with abdominal pain and weight loss.   Abdomen and pelvic CT on 12/01/2017 revealed a suspicious enhancing, filling defect involving the lower pole collecting system of the left kidney with asymmetric hypoenhancement of the inferior pole cortex of left kidney noted. Findings were worrisome for urothelial neoplasm.  There was abdominal and pelvic adenopathy compatible with nodal metastasis.  There was a 2.6 cm right para celiac node, 1,6 cm left retroperitoneal node, 1.4 cm right retroperitoneal node, 2.1 cm right common iliac node and 1 cm left external iliac node.  There was aortic atherosclerosis, kidney cysts, and prostate gland enlargement.   Chest CT on 12/07/2017 revealed no evidence of metastatic disease.     He underwent left ureteroscopy with renal pelvis biopsy and left ureteral stent placement on 12/10/2017.  Findings included a large nodular, vascular tumor within the  left lower pole collecting system completely obstructing the entire left lower pole moiety.  Ureter, right kidney, and bladder was normal.  Pathology revealed small fragments of high-grade urothelial carcinoma with a small focus of invasion.   He has diabetes and coronary artery disease s/p MI in 1995.  He is s/p stent plaecment x 5-6. Colonoscopy in 2018 was negative.       Urothelial carcinoma of kidney, left ([place])  12/07/2017 Initial Diagnosis   Urothelial carcinoma of kidney, left ([place])    12/21/2017 - 05/27/2018 Chemotherapy   The patient had dexamethasone (DECADRON) 4 MG tablet, 8 mg, Oral, Daily, 1 of 1 cycle, Start date: --, End date: -- palonosetron (ALOXI) injection 0.25 mg, 0.25 mg, Intravenous,  Once, 8 of 10 cycles Administration: 0.25 mg (12/21/2017), 0.25 mg (01/14/2018), 0.25 mg (02/04/2018), 0.25 mg (02/25/2018), 0.25 mg (03/18/2018), 0.25 mg (04/08/2018), 0.25 mg (04/29/2018), 0.25 mg (05/27/2018) CARBOplatin (PARAPLATIN) 390 mg in sodium chloride 0.9 % 250 mL chemo infusion, 370 mg (100 % of original dose 370.4 mg), Intravenous,  Once, 8 of 10 cycles Dose modification:   (original dose 370.4 mg, Cycle 1, Reason: Provider Judgment),   (original dose 370.4 mg, Cycle 2, Reason: Other (see comments), Comment: Continue same dose as last cycle) Administration: 390 mg (12/21/2017), 390 mg (01/14/2018), 370 mg (02/04/2018),  370 mg (02/25/2018), 370 mg (03/18/2018), 370 mg (04/08/2018), 370 mg (04/29/2018), 370 mg (05/27/2018) gemcitabine (GEMZAR) 1,600 mg in sodium chloride 0.9 % 250 mL chemo infusion, 1,482 mg (80 % of original dose 1,000 mg/m2), Intravenous,  Once, 8 of 10 cycles Dose modification: 800 mg/m2 (original dose 1,000 mg/m2, Cycle 1, Reason: Provider Judgment), 800 mg/m2 (original dose 1,000 mg/m2, Cycle 2, Reason: Other (see comments), Comment: same dose as last cycle), 850 mg/m2 (original dose 1,000 mg/m2, Cycle 3, Reason: Provider Judgment, Comment: prior dose tolerated), 700 mg/m2  (original dose 1,000 mg/m2, Cycle 7, Reason: Provider Judgment), 750 mg/m2 (original dose 1,000 mg/m2, Cycle 7, Reason: Provider Judgment) Administration: 1,600 mg (12/21/2017), 1,600 mg (12/31/2017), 1,600 mg (01/14/2018), 1,600 mg (01/21/2018), 1,600 mg (02/04/2018), 1,600 mg (02/11/2018), 1,600 mg (02/25/2018), 1,600 mg (03/18/2018), 1,600 mg (04/08/2018), 1,600 mg (04/15/2018), 1,600 mg (04/29/2018), 1,400 mg (05/06/2018), 1,400 mg (05/27/2018) ondansetron (ZOFRAN) 8 mg in sodium chloride 0.9 % 50 mL IVPB, , Intravenous,  Once, 5 of 7 cycles Administration:  (01/21/2018)   for chemotherapy treatment.     01/14/2019 -  Chemotherapy    Patient is on Treatment Plan: UROTHELIAL LOCALLY ADVANCED / METASTATIC ENFORTUMAB Q28D       01/03/2021 Cancer Staging   Staging form: Kidney, AJCC 8th Edition - Clinical stage from 01/03/2021: Stage IV (cTX, cN1, cM1) - Signed by Creig Hines, MD on 01/05/2021  Stage prefix: Initial diagnosis    Urothelial cancer (HCC)  12/18/2017 Initial Diagnosis   Urothelial cancer (HCC)    06/17/2018 - 12/24/2018 Chemotherapy   The patient had atezolizumab (TECENTRIQ) 1,200 mg in sodium chloride 0.9 % 250 mL chemo infusion, 1,200 mg, Intravenous, Once, 10 of 10 cycles Administration: 1,200 mg (06/17/2018), 1,200 mg (07/05/2018), 1,200 mg (07/29/2018), 1,200 mg (08/19/2018), 1,200 mg (09/30/2018), 1,200 mg (09/09/2018), 1,200 mg (10/21/2018), 1,200 mg (11/11/2018), 1,200 mg (12/03/2018), 1,200 mg (12/24/2018)   for chemotherapy treatment.       ALLERGIES:  is allergic to sulfa antibiotics, ace inhibitors, invokana [canagliflozin], and penicillins.  MEDICATIONS:  Current Outpatient Medications  Medication Sig Dispense Refill   aspirin EC 81 MG tablet Take 81 mg by mouth every evening.      atorvastatin (LIPITOR) 40 MG tablet TAKE 1 TABLET BY MOUTH AT BEDTIME 90 tablet 0   blood glucose meter kit and supplies Dispense based on patient and insurance preference. Use once daily as  directed. (FOR ICD-10 E11.9). 1 each 0   Cholecalciferol (VITAMIN D3) 25 MCG (1000 UT) CAPS Take 1 capsule by mouth daily.     clobetasol cream (TEMOVATE) 0.05 % Apply 1 application topically as needed (for skin rash). 30 g 1   empagliflozin (JARDIANCE) 10 MG TABS tablet Take 1 tablet (10 mg total) by mouth daily. 90 tablet 2   fluticasone (FLONASE) 50 MCG/ACT nasal spray Use 2 spray(s) in each nostril once daily 48 g 3   glucose blood (ONETOUCH ULTRA) test strip Use as instructed 100 each 12   hydrOXYzine (ATARAX/VISTARIL) 25 MG tablet Take 1 tablet (25 mg total) by mouth every 8 (eight) hours as needed for itching. 30 tablet 3   Ivermectin 1 % CREA Apply 1 application topically daily as needed (rosacea). To face     Lancets (ONETOUCH ULTRASOFT) lancets Use as instructed 100 each 12   lidocaine-prilocaine (EMLA) cream Apply 1 application topically as needed (port access). 1 g 3   losartan (COZAAR) 50 MG tablet TAKE 1 TABLET BY MOUTH ONCE DAILY (SCHEDULE  OFFICE  VISIT  FOR  FURTHER  REFILLS) 90 tablet 0   magnesium hydroxide (MILK OF MAGNESIA) 400 MG/5ML suspension Take 15 mLs by mouth daily as needed for mild constipation.     metFORMIN (GLUCOPHAGE) 1000 MG tablet TAKE 1 TABLET BY MOUTH TWICE DAILY WITH MEALS 180 tablet 0   polyethylene glycol (MIRALAX / GLYCOLAX) 17 g packet Take 17 g by mouth daily as needed.     pregabalin (LYRICA) 75 MG capsule Take 1 capsule by mouth once daily 90 capsule 0   sertraline (ZOLOFT) 50 MG tablet Take 1 tablet (50 mg total) by mouth daily. 90 tablet 3   vitamin B-12 (CYANOCOBALAMIN) 1000 MCG tablet Take by mouth daily. Unsure dose     Blood Glucose Monitoring Suppl (ONE TOUCH ULTRA 2) w/Device KIT 1 each by Does not apply route once for 1 dose. Test blood sugars once daily 1 kit 0   nystatin cream (MYCOSTATIN) Apply topically 2 (two) times daily. (Patient not taking: Reported on 02/04/2021) 15 g 1   OVER THE COUNTER MEDICATION Apply 1 application topically daily  as needed (pain). Outback topical pain relief     psyllium (METAMUCIL) 58.6 % powder Take 1 packet by mouth daily. (Patient not taking: Reported on 02/04/2021)     vitamin C (ASCORBIC ACID) 500 MG tablet Take 1,000-1,500 mg by mouth 2 (two) times daily. (Patient not taking: No sig reported)     No current facility-administered medications for this visit.   Facility-Administered Medications Ordered in Other Visits  Medication Dose Route Frequency Provider Last Rate Last Admin   Influenza vac split quadrivalent PF (FLUZONE HIGH-DOSE) injection 0.5 mL  0.5 mL Intramuscular Once Karen Kitchens, NP        VITAL SIGNS: Pulse 89   Temp (!) 97.4 F (36.3 C) (Tympanic)   SpO2 100%  There were no vitals filed for this visit.  Estimated body mass index is 23.26 kg/m as calculated from the following:   Height as of 01/31/21: $RemoveBef'5\' 8"'yaGRcoWBdD$  (1.727 m).   Weight as of 01/31/21: 153 lb (69.4 kg).  LABS: CBC:    Component Value Date/Time   WBC 9.6 01/31/2021 0912   HGB 11.4 (L) 01/31/2021 0912   HGB 13.7 03/15/2017 1042   HCT 34.2 (L) 01/31/2021 0912   HCT 39.7 03/15/2017 1042   PLT 255 01/31/2021 0912   PLT 270 03/15/2017 1042   MCV 85.1 01/31/2021 0912   MCV 89 03/15/2017 1042   MCV 89 02/03/2013 2223   NEUTROABS 7.3 01/31/2021 0912   NEUTROABS 4.6 03/15/2017 1042   LYMPHSABS 1.1 01/31/2021 0912   LYMPHSABS 1.2 03/15/2017 1042   MONOABS 0.9 01/31/2021 0912   EOSABS 0.3 01/31/2021 0912   EOSABS 0.3 03/15/2017 1042   BASOSABS 0.1 01/31/2021 0912   BASOSABS 0.0 03/15/2017 1042   Comprehensive Metabolic Panel:    Component Value Date/Time   NA 133 (L) 01/31/2021 0912   NA 137 03/15/2017 1042   NA 137 02/03/2013 2223   K 4.0 01/31/2021 0912   K 4.0 02/03/2013 2223   CL 99 01/31/2021 0912   CL 106 02/03/2013 2223   CO2 24 01/31/2021 0912   CO2 28 02/03/2013 2223   BUN 25 (H) 01/31/2021 0912   BUN 9 03/15/2017 1042   BUN 11 02/03/2013 2223   CREATININE 0.93 01/31/2021 0912   CREATININE  0.69 02/03/2013 2223   GLUCOSE 120 (H) 01/31/2021 0912   GLUCOSE 158 (H) 02/03/2013 2223   CALCIUM 9.4 01/31/2021 0912  CALCIUM 9.3 02/03/2013 2223   AST 14 (L) 01/31/2021 0912   AST 26 02/03/2013 2223   ALT 9 01/31/2021 0912   ALT 30 02/03/2013 2223   ALKPHOS 95 01/31/2021 0912   ALKPHOS 121 02/03/2013 2223   BILITOT 1.2 01/31/2021 0912   BILITOT 1.5 (H) 03/15/2017 1042   BILITOT 1.1 (H) 02/03/2013 2223   PROT 7.5 01/31/2021 0912   PROT 6.9 03/15/2017 1042   PROT 7.5 02/03/2013 2223   ALBUMIN 3.7 01/31/2021 0912   ALBUMIN 4.3 03/15/2017 1042   ALBUMIN 3.3 (L) 02/03/2013 2223    RADIOGRAPHIC STUDIES: MR Abdomen W Wo Contrast  Result Date: 01/24/2021 CLINICAL DATA:  Urothelial cancer with recurrent tumor in the left renal pelvis and lower pole collecting system on CT 1 month ago EXAM: MRI ABDOMEN WITHOUT AND WITH CONTRAST TECHNIQUE: Multiplanar multisequence MR imaging of the abdomen was performed both before and after the administration of intravenous contrast. CONTRAST:  68mL GADAVIST GADOBUTROL 1 MMOL/ML IV SOLN COMPARISON:  Multiple exams, including 12/20/2020 FINDINGS: Lower chest: Metal artifact favoring stents in the left anterior descending and right coronary arteries. Hepatobiliary: Small gallstone in the gallbladder on image 14 series 4. No biliary dilatation. No significant abnormal focal lesion to suggest metastatic disease to the liver. Pancreas:  Atrophic pancreas Spleen:  Unremarkable Adrenals/Urinary Tract: Both adrenal glands appear normal. Bilateral renal cysts are present. Substantial enhancing material compatible with tumor in the central collecting system and lower pole collecting system with wall thickening and enhancement of the proximal ureter. Abnormal reticular hypoenhancement in the medullary space of the left kidney lower pole along with some mild cortical hypoenhancement as on recent CT, this could be related to obstruction or tumor infiltration. Stomach/Bowel: As  before, there is narrowing in the gastric antrum, which could be therapy related. Scattered diverticula of the descending colon. Vascular/Lymphatic: Indistinct left renal vein tributaries on the left, cannot completely exclude the possibility of renal vein thrombosis along the renal hilum, but otherwise the renal vein and IVC appear normal. No pathologic adenopathy identified. Other:  Low-level perirenal edema bilaterally, likely chronic. Musculoskeletal: Unremarkable IMPRESSION: 1. Tumor in the left renal pelvis, left kidney lower pole collecting system, potentially in the proximal left ureter, and potentially infiltrating the left kidney lower pole which demonstrates heterogeneous enhancement. This is similar to the recent CT scan. I do not see overtly pathologic adenopathy at this time. Indistinctness of the left renal vein along the left renal hilum which is indeterminate for tumor thrombus, but no filling defect more medially in the renal vein is observed. 2. Cholelithiasis. 3. Coronary artery stents. 4. Bilateral renal cysts. 5. Narrowing of the gastric antrum unchanged. Electronically Signed   By: Van Clines M.D.   On: 01/24/2021 14:30    PERFORMANCE STATUS (ECOG) : 2 - Symptomatic, <50% confined to bed  Review of Systems Unless otherwise noted, a complete review of systems is negative.  Physical Exam General: NAD Cardiovascular: regular rate and rhythm Pulmonary: clear ant fields Abdomen: soft, nontender, + bowel sounds GU: no suprapubic tenderness Extremities: no edema, no joint deformities Skin: no rashes Neurological: Weakness but otherwise nonfocal  Assessment and Plan- Patient is a 75 y.o. 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  He received 1 L normal saline via IV.  Patient felt much improved following his IV  fluids with normalization of his  blood pressure.  Encouraged oral fluids.  Patient to monitor his BP at home and discussed parameters for holding losartan if needed.  Discussed parameters for utilization of ER or urgent care over the weekend if needed.  We could also see him early next week in the clinic for repeat labs/fluids if needed.  Weakness -referral for rehab screening.  Patient is interested in ambulatory PT at the hospital.  Case and plan discussed with Dr. Janese Banks  Patient expressed understanding and was in agreement with this plan. He also understands that He can call clinic at any time with any questions, concerns, or complaints.   Thank you for allowing me to participate in the care of this very pleasant patient.   Time Total: 25 minutes  Visit consisted of counseling and education dealing with the complex and emotionally intense issues of symptom management and palliative care in the setting of serious and potentially life-threatening illness.Greater than 50%  of this time was spent counseling and coordinating care related to the above assessment and plan.  Signed by: Altha Harm, PhD, NP-C

## 2021-02-04 NOTE — Patient Instructions (Signed)

## 2021-02-08 ENCOUNTER — Ambulatory Visit
Admission: RE | Admit: 2021-02-08 | Discharge: 2021-02-08 | Disposition: A | Discharge status: Home / Self Care | Payer: HMO | Source: Ambulatory Visit | Attending: Radiation Oncology | Admitting: Radiation Oncology

## 2021-02-08 DIAGNOSIS — C642 Malignant neoplasm of left kidney, except renal pelvis: Secondary | ICD-10-CM | POA: Insufficient documentation

## 2021-02-08 DIAGNOSIS — Z510 Encounter for antineoplastic radiation therapy: Secondary | ICD-10-CM | POA: Diagnosis present

## 2021-02-09 ENCOUNTER — Encounter: Payer: Self-pay | Admitting: Hematology and Oncology

## 2021-02-09 ENCOUNTER — Telehealth: Payer: Self-pay

## 2021-02-09 ENCOUNTER — Encounter: Payer: Self-pay | Admitting: Oncology

## 2021-02-09 ENCOUNTER — Telehealth: Payer: Self-pay | Admitting: Student

## 2021-02-09 NOTE — Telephone Encounter (Signed)
152 pm.  Phone call made to wife to follow up on patient status and to offer a home visit.  Wife states patient continues with chemotherapy monthly and has 2 more sessions of radiation to complete.  She would like to schedule a home visit with the Palliative Care NP.  Advised that I would have our scheduler call back to schedule a time.  Palliative Care scheduler notified of visit need.

## 2021-02-09 NOTE — Telephone Encounter (Signed)
Spoke with patient and patient's wife and have scheduled an In-home Palliative f/u visit for 02/14/21 @ 11:30 AM

## 2021-02-10 ENCOUNTER — Ambulatory Visit
Admission: RE | Admit: 2021-02-10 | Discharge: 2021-02-10 | Disposition: A | Discharge status: Home / Self Care | Payer: HMO | Source: Ambulatory Visit | Attending: Radiation Oncology | Admitting: Radiation Oncology

## 2021-02-10 DIAGNOSIS — Z510 Encounter for antineoplastic radiation therapy: Secondary | ICD-10-CM | POA: Diagnosis not present

## 2021-02-14 ENCOUNTER — Other Ambulatory Visit: Payer: Self-pay

## 2021-02-14 ENCOUNTER — Other Ambulatory Visit: Payer: HMO | Admitting: Student

## 2021-02-14 DIAGNOSIS — Z515 Encounter for palliative care: Secondary | ICD-10-CM

## 2021-02-14 DIAGNOSIS — R531 Weakness: Secondary | ICD-10-CM

## 2021-02-14 DIAGNOSIS — C689 Malignant neoplasm of urinary organ, unspecified: Secondary | ICD-10-CM

## 2021-02-14 DIAGNOSIS — R630 Anorexia: Secondary | ICD-10-CM

## 2021-02-14 NOTE — Progress Notes (Signed)
[facility] Consult Note Telephone: [phone]  Fax: [phone]    Date of encounter: 02/14/21 PATIENT NAME: Patrick Mcgee [address] [place] [address]   [id number] (home)  DOB: [birth year] MRN: [id number] PRIMARY CARE PROVIDER:    Jerrol Banana., MD,  [address] [place] [address] [phone]  REFERRING PROVIDER:   Jerrol Banana., MD [address] [name],  [place] [address] [phone]  RESPONSIBLE PARTY:    Contact Information     Name Relation Home Work Mobile   Nunes,[name] Spouse [phone]  Delevan Daughter   [phone]        I met face to face with patient in the home. Palliative Care was asked to follow this patient by consultation request of  Jerrol Banana.,* to address advance care planning and complex medical decision making. This is a follow up visit.                                   ASSESSMENT AND PLAN / RECOMMENDATIONS:   Advance Care Planning/Goals of Care: Goals include to maximize quality of life and symptom management.   CODE STATUS: DNR on file.  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 Follow up with Oncology as scheduled.   Fatigue/generalized weakness-patient encourage to eat a well balanced diet, adequate sleep routine. He is planning to start out patient physical therapy this month. We did discuss low dose oral steroid as an option should he see no improvement after completing radiation and above stated interventions.  Appetite-recommend eating foods he enjoys, small meals throughout the day. Continue nutritional supplements 1-2 daily. Adequate fluids are encouraged.   Follow up Palliative Care Visit: Palliative care will continue to follow for complex medical decision making, advance  care planning, and clarification of goals. Return in 8 weeks or prn.  I spent 40 minutes providing this consultation. More than 50% of the time in this consultation was spent in counseling and care coordination.    PPS: 60%  HOSPICE ELIGIBILITY/DIAGNOSIS: TBD  Chief Complaint: Palliative Medicine follow up visit.   HISTORY OF PRESENT ILLNESS:  Patrick Mcgee is a 75 y.o. year old male  with urothelial cancer with metastasis, dx in 2019;  T2DM, CAD, hypertension, hyperlipidemia.   Patient resides at home with wife. Currently receiving chemotherapy once a month, Padcev. Has one more radiation treatment tomorrow. Plan is to continue treatment as able to tolerate. He denies pain except neuropathy. Takes Lyrica with effectiveness. Denies shortness of breath, nausea, constipation. Endorses fatigue and weakness. Endorses a decline in appetite. Has been drinking protein shakes, smoothies. His wife has been caring for her ill father, so she has not been available to cook as much. He reports his weight is down to 146 pounds. Checks blood sugar once a day; usually runs between 130-220 mg/dL. Takes Jardiance when his blood sugar is over 200 mg/dL. Patient received IV fluids at his last Oncology appointment due to being dehydrated, hypotensive. His sleep varies; naps occasionally during the day.  History obtained from review of EMR, discussion with primary team, and interview with family, facility staff/caregiver and/or Mr. Noon.  I reviewed available labs, medications, imaging, studies and related documents from the EMR.  Records reviewed and summarized above.  ROS  General: NAD EYES: denies vision changes ENMT: denies dysphagia Cardiovascular: denies chest pain, denies DOE Pulmonary: denies cough, denies increased SOB Abdomen: endorses fair appetite, denies constipation, endorses continence of bowel GU: denies dysuria, endorses continence of urine MSK:  + weakness,  no falls reported Skin:  denies rashes or wounds Neurological: denies pain, denies insomnia Psych: Endorses positive mood Heme/lymph/immuno: denies bruises, abnormal bleeding  Physical Exam: Current and past weights: 146 pounds Pulse 84, resp 20, 108/60, sats 98% on room air Constitutional: NAD General: frail appearing, thin EYES: anicteric sclera, lids intact, no discharge  ENMT: intact hearing, oral mucous membranes moist, dentition intact CV: S1S2, RRR, no LE edema Pulmonary: LCTA, no increased work of breathing, no cough, room air Abdomen: intake 100%, normo-active BS + 4 quadrants, soft and non tender GU: deferred MSK:  moves all extremities, ambulatory without assistive device Skin: warm and dry, no rashes or wounds on visible skin Neuro: generalized weakness, A & O x 3 Psych: non-anxious affect, pleasant Hem/lymph/immuno: no widespread bruising   Thank you for the opportunity to participate in the care of [name].  The palliative care team will continue to follow. Please call our office at [phone] if we can be of additional assistance.   Ezekiel Slocumb, NP   COVID-19 PATIENT SCREENING TOOL Asked and negative response unless otherwise noted:   Have you had symptoms of covid, tested positive or been in contact with someone with symptoms/positive test in the past 5-10 days? No

## 2021-02-15 ENCOUNTER — Ambulatory Visit
Admission: RE | Admit: 2021-02-15 | Discharge: 2021-02-15 | Disposition: A | Discharge status: Home / Self Care | Payer: HMO | Source: Ambulatory Visit | Attending: Radiation Oncology | Admitting: Radiation Oncology

## 2021-02-15 DIAGNOSIS — Z510 Encounter for antineoplastic radiation therapy: Secondary | ICD-10-CM | POA: Diagnosis not present

## 2021-02-27 ENCOUNTER — Other Ambulatory Visit: Payer: Self-pay | Admitting: *Deleted

## 2021-02-27 DIAGNOSIS — C642 Malignant neoplasm of left kidney, except renal pelvis: Secondary | ICD-10-CM

## 2021-02-28 ENCOUNTER — Inpatient Hospital Stay: Payer: HMO

## 2021-02-28 ENCOUNTER — Inpatient Hospital Stay: Payer: HMO | Attending: Oncology | Admitting: Oncology

## 2021-02-28 ENCOUNTER — Encounter: Payer: Self-pay | Admitting: Oncology

## 2021-02-28 VITALS — BP 116/67 | HR 87 | Temp 96.0°F | Resp 17 | Wt 149.0 lb

## 2021-02-28 DIAGNOSIS — T451X5A Adverse effect of antineoplastic and immunosuppressive drugs, initial encounter: Secondary | ICD-10-CM

## 2021-02-28 DIAGNOSIS — Z5111 Encounter for antineoplastic chemotherapy: Secondary | ICD-10-CM

## 2021-02-28 DIAGNOSIS — C642 Malignant neoplasm of left kidney, except renal pelvis: Secondary | ICD-10-CM | POA: Insufficient documentation

## 2021-02-28 DIAGNOSIS — Z5112 Encounter for antineoplastic immunotherapy: Secondary | ICD-10-CM | POA: Insufficient documentation

## 2021-02-28 DIAGNOSIS — G620 Drug-induced polyneuropathy: Secondary | ICD-10-CM

## 2021-02-28 LAB — CBC WITH DIFFERENTIAL/PLATELET
Abs Immature Granulocytes: 0.03 10*3/uL (ref 0.00–0.07)
Basophils Absolute: 0.1 10*3/uL (ref 0.0–0.1)
Basophils Relative: 1 %
Eosinophils Absolute: 0.3 10*3/uL (ref 0.0–0.5)
Eosinophils Relative: 4 %
HCT: 34.9 % — ABNORMAL LOW (ref 39.0–52.0)
Hemoglobin: 11.4 g/dL — ABNORMAL LOW (ref 13.0–17.0)
Immature Granulocytes: 0 %
Lymphocytes Relative: 14 %
Lymphs Abs: 1.1 10*3/uL (ref 0.7–4.0)
MCH: 27.7 pg (ref 26.0–34.0)
MCHC: 32.7 g/dL (ref 30.0–36.0)
MCV: 84.9 fL (ref 80.0–100.0)
Monocytes Absolute: 0.7 10*3/uL (ref 0.1–1.0)
Monocytes Relative: 9 %
Neutro Abs: 5.6 10*3/uL (ref 1.7–7.7)
Neutrophils Relative %: 72 %
Platelets: 247 10*3/uL (ref 150–400)
RBC: 4.11 MIL/uL — ABNORMAL LOW (ref 4.22–5.81)
RDW: 15.3 % (ref 11.5–15.5)
WBC: 7.7 10*3/uL (ref 4.0–10.5)
nRBC: 0 % (ref 0.0–0.2)

## 2021-02-28 LAB — COMPREHENSIVE METABOLIC PANEL
ALT: 10 U/L (ref 0–44)
AST: 18 U/L (ref 15–41)
Albumin: 3.7 g/dL (ref 3.5–5.0)
Alkaline Phosphatase: 110 U/L (ref 38–126)
Anion gap: 7 (ref 5–15)
BUN: 10 mg/dL (ref 8–23)
CO2: 28 mmol/L (ref 22–32)
Calcium: 9.5 mg/dL (ref 8.9–10.3)
Chloride: 99 mmol/L (ref 98–111)
Creatinine, Ser: 0.78 mg/dL (ref 0.61–1.24)
GFR, Estimated: >60 mL/min (ref >60)
Glucose, Bld: 157 mg/dL — ABNORMAL HIGH (ref 70–99)
Potassium: 4.2 mmol/L (ref 3.5–5.1)
Sodium: 134 mmol/L — ABNORMAL LOW (ref 135–145)
Total Bilirubin: 0.8 mg/dL (ref 0.3–1.2)
Total Protein: 7.7 g/dL (ref 6.5–8.1)

## 2021-02-28 MED ORDER — PALONOSETRON HCL INJECTION 0.25 MG/5ML
0.2500 mg | Freq: Once | INTRAVENOUS | Status: AC
  Administered 2021-02-28: 0.25 mg via INTRAVENOUS
  Filled 2021-02-28: qty 5

## 2021-02-28 MED ORDER — HEPARIN SOD (PORK) LOCK FLUSH 100 UNIT/ML IV SOLN
INTRAVENOUS | Status: AC
  Filled 2021-02-28: qty 5

## 2021-02-28 MED ORDER — SODIUM CHLORIDE 0.9 % IV SOLN
Freq: Once | INTRAVENOUS | Status: AC
  Filled 2021-02-28: qty 250

## 2021-02-28 MED ORDER — HEPARIN SOD (PORK) LOCK FLUSH 100 UNIT/ML IV SOLN
500.0000 [IU] | Freq: Once | INTRAVENOUS | Status: AC
  Administered 2021-02-28: 500 [IU] via INTRAVENOUS
  Filled 2021-02-28: qty 5

## 2021-02-28 MED ORDER — SODIUM CHLORIDE 0.9 % IV SOLN
1.0000 mg/kg | Freq: Once | INTRAVENOUS | Status: AC
  Administered 2021-02-28: 70 mg via INTRAVENOUS
  Filled 2021-02-28: qty 3

## 2021-02-28 MED ORDER — SODIUM CHLORIDE 0.9% FLUSH
10.0000 mL | INTRAVENOUS | Status: DC | PRN
  Administered 2021-02-28: 10 mL via INTRAVENOUS
  Filled 2021-02-28: qty 10

## 2021-02-28 NOTE — Progress Notes (Signed)
Patient here for oncology follow-up appointment, concerns of fall this morning but no pain or other symptoms

## 2021-02-28 NOTE — Progress Notes (Signed)
I connected with Patrick Mcgee on 02/28/21 at  9:00 AM EDT by video enabled telemedicine visit and verified that I am speaking with the correct person using two identifiers.   I discussed the limitations, risks, security and privacy concerns of performing an evaluation and management service by telemedicine and the availability of in-person appointments. I also discussed with the patient that there may be a patient responsible charge related to this service. The patient expressed understanding and agreed to proceed.  Other persons participating in the visit and their role in the encounter:  none  Patient's location:  cancer center Provider's location:  home  Chief Complaint:  On treatment assessment prior to next cycle of Padcev  History of present illness: Patient is a 75 year old male who sees Patrick Mcgee so far for his metastatic urothelial carcinoma.  This was originally diagnosed in May 2019.  He was noted to have a filling defect in the lower pole collecting system of the left kidney along with para-aortic and retroperitoneal adenopathy concerning for metastatic disease.  He underwent left ureteroscopy and renal pelvis biopsy which revealed small fragments of high-grade urothelial carcinoma with small focus of invasion.  He was started on carboplatin and gemcitabine chemotherapy in June 2019 and was continued on 05/27/2018 for 8 cycles.  He tolerated chemotherapy well except for chemo-induced anemia for which she has been getting Procrit every 2 weeks.  He did have response to his disease based on scans in August 2019.  However repeat scan on 05/31/2018 showed increase in the size of the primary tumor from 1.2 to 1.9 cm and increase in left para-aortic adenopathy from 0.9 to 1.3 cm.  Periportal adenopathy was stable at 1.4 cm.  Second line immunotherapy was recommended.  His initial biopsy specimen did not have enough sample to undergo FGFR mutation testing.  He also has mild interstitial lung  disease for which he sees pulmonary but he is not on home oxygen.  He has B12 deficiency for which he is on oral B12.  Also has diabetes and coronary artery disease.  Tecentriq started on 06/17/2018.  Patient noted to have disease progression in his lymph nodes in May 2020.  Repeat biopsy showed metastatic urothelial carcinoma which did not have a FGFR mutation.  He has been started on third line Padcev   Treatment on hold since April 2021 due to neuropathy and to maintain quality of life but restarted in August 2021 after he was found to have progression of disease which he has 1 week on 3 weeks off   CT scan in June 2022 showed increase in the size of primary urothelial mass but no other evidence of metastatic disease elsewhere.  He completed palliative radiation to the mass in July 2022  Interval history he has completed radiation treatment. Overall he feels fatigued with radiation. This morning he had a fall while coming out of his shower and hit the back of his head. No loss of consciousness. Peripheral neuropathy is stable   Review of Systems  Constitutional:  Positive for malaise/fatigue. Negative for chills, fever and weight loss.  HENT:  Negative for congestion, ear discharge and nosebleeds.   Eyes:  Negative for blurred vision.  Respiratory:  Negative for cough, hemoptysis, sputum production, shortness of breath and wheezing.   Cardiovascular:  Negative for chest pain, palpitations, orthopnea and claudication.  Gastrointestinal:  Negative for abdominal pain, blood in stool, constipation, diarrhea, heartburn, melena, nausea and vomiting.  Genitourinary:  Negative for dysuria, flank pain, frequency,  hematuria and urgency.  Musculoskeletal:  Negative for back pain, joint pain and myalgias.  Skin:  Negative for rash.  Neurological:  Negative for dizziness, tingling, focal weakness, seizures, weakness and headaches.  Endo/Heme/Allergies:  Does not bruise/bleed easily.   Psychiatric/Behavioral:  Negative for depression and suicidal ideas. The patient does not have insomnia.    Allergies  Allergen Reactions   Sulfa Antibiotics Other (See Comments)    Joint pain   Ace Inhibitors Cough   Invokana [Canagliflozin] Other (See Comments)    Leg pain   Penicillins Rash    Did it involve swelling of the face/tongue/throat, SOB, or low BP? No Did it involve sudden or severe rash/hives, skin peeling, or any reaction on the inside of your mouth or nose? Yes Did you need to seek medical attention at a hospital or doctor's office? Yes When did it last happen?      childhood allergy If all above answers are "NO", may proceed with cephalosporin use.     Past Medical History:  Diagnosis Date   Acid reflux    Anxiety    Depression    Diabetes mellitus without complication ([facility])    Hyperlipidemia    Hypertension    MRSA (methicillin resistant Staphylococcus aureus) infection 1992   history   Myocardial infarction ([facility]) 1995   Sleep apnea    CPAP   Urothelial cancer ([facility]) 11/2017   Left Urothelial mass, chemo tx's    Past Surgical History:  Procedure Laterality Date   CATARACT EXTRACTION Left    COLONOSCOPY  2010   [facility]   COLONOSCOPY WITH PROPOFOL N/A 10/04/2016   Procedure: COLONOSCOPY WITH PROPOFOL;  Surgeon: [name], MD;  Location: [facility];  Service: Endoscopy;  Laterality: N/A;   [place], 2000, 2001, 2014   CYSTOSCOPY W/ RETROGRADES Bilateral 12/10/2017   Procedure: CYSTOSCOPY WITH RETROGRADE PYELOGRAM;  Surgeon: [name], MD;  Location: [facility];  Service: Urology;  Laterality: Bilateral;   CYSTOSCOPY W/ RETROGRADES Left 12/09/2018   Procedure: CYSTOSCOPY WITH RETROGRADE PYELOGRAM;  Surgeon: [name], MD;  Location: [facility];  Service: Urology;  Laterality: Left;   CYSTOSCOPY WITH BIOPSY Left 12/09/2018   Procedure: CYSTOSCOPY WITH URETERAL/RENAL PELVIC BIOPSY;  Surgeon: [name], MD;  Location: [facility];  Service: Urology;  Laterality: Left;   CYSTOSCOPY WITH STENT PLACEMENT Left 12/10/2017   Procedure: CYSTOSCOPY WITH STENT PLACEMENT;  Surgeon: [name], MD;  Location: [facility];  Service: Urology;  Laterality: Left;   CYSTOSCOPY WITH STENT PLACEMENT Left 12/09/2018   Procedure: CYSTOSCOPY WITH STENT PLACEMENT;  Surgeon: [name], MD;  Location: [facility];  Service: Urology;  Laterality: Left;   CYSTOSCOPY WITH URETEROSCOPY Left 12/09/2018   Procedure: CYSTOSCOPY WITH URETEROSCOPY;  Surgeon: [name], MD;  Location: [facility];  Service: Urology;  Laterality: Left;   EYE SURGERY Bilateral    cataract   INGUINAL HERNIA REPAIR Right 08/09/2015   Procedure: HERNIA REPAIR INGUINAL ADULT;  Surgeon: [name], MD;  Location: [facility];  Service: General;  Laterality: Right;   NASAL SINUS SURGERY     nuclear stress test     PORTA CATH INSERTION N/A 12/19/2017   Procedure: PORTA CATH INSERTION;  Surgeon: [name], MD;  Location: [facility];  Service: Cardiovascular;  Laterality: N/A;   TOOTH EXTRACTION  05/28/2020   upper left and it was molars   URETERAL BIOPSY Left 12/10/2017   Procedure: URETERAL & renal  PELVIS BIOPSY;  Surgeon: [name], MD;  Location: [facility];  Service: Urology;  Laterality: Left;   URETEROSCOPY Left 12/10/2017   Procedure: URETEROSCOPY;  Surgeon: [name], MD;  Location: [facility];  Service: Urology;  Laterality: Left;    Social History   Socioeconomic History   Marital status: Married    Spouse name: [name]   Number of children: 1   Years of education: Not on file   Highest education level: Associate degree: occupational, Hotel manager, or vocational program  Occupational History   Occupation: retired  Tobacco Use   Smoking status: Former    Types: Cigars    Quit date: 10/09/1978    Years since quitting: 42.4   Smokeless tobacco: Former    Types: Chew    Quit date: 12/04/1988   Tobacco comments:     on occasion  Vaping Use   Vaping Use: Never used  Substance and Sexual Activity   Alcohol use: Yes    Comment: beer once a while   Drug use: No   Sexual activity: Not Currently  Other Topics Concern   Not on file  Social History Narrative   Not on file   Social Determinants of Health   Financial Resource Strain: Low Risk    Difficulty of Paying Living Expenses: Not hard at all  Food Insecurity: No Food Insecurity   Worried About Charity fundraiser in the Last Year: Never true   [place] in the Last Year: Never true  Transportation Needs: No Transportation Needs   Lack of Transportation (Medical): No   Lack of Transportation (Non-Medical): No  Physical Activity: Insufficiently Active   Days of Exercise per Week: 3 days   Minutes of Exercise per Session: 30 min  Stress: No Stress Concern Present   Feeling of Stress : Not at all  Social Connections: Moderately Integrated   Frequency of Communication with Friends and Family: Three times a week   Frequency of Social Gatherings with Friends and Family: More than three times a week   Attends Religious Services: More than 4 times per year   Active Member of [employer] or Organizations: No   Attends Music therapist: Never   Marital Status: Married  Human resources officer Violence: Not At Risk   Fear of Current or Ex-Partner: No   Emotionally Abused: No   Physically Abused: No   Sexually Abused: No    Family History  Problem Relation Age of Onset   Heart disease Mother    Cancer Father        Lung and colon cancer   Heart disease Father    Emphysema Maternal Grandfather    Tuberculosis Maternal Grandmother      Current Outpatient Medications:    aspirin EC 81 MG tablet, Take 81 mg by mouth every evening. , Disp: , Rfl:    atorvastatin (LIPITOR) 40 MG tablet, TAKE 1 TABLET BY MOUTH AT BEDTIME, Disp: 90 tablet, Rfl: 0   blood glucose meter kit and supplies, Dispense based on patient and insurance preference.  Use once daily as directed. (FOR ICD-10 E11.9)., Disp: 1 each, Rfl: 0   Blood Glucose Monitoring Suppl (ONE TOUCH ULTRA 2) w/Device KIT, 1 each by Does not apply route once for 1 dose. Test blood sugars once daily, Disp: 1 kit, Rfl: 0   Cholecalciferol (VITAMIN D3) 25 MCG (1000 UT) CAPS, Take 1 capsule by mouth daily., Disp: , Rfl:    clobetasol cream (TEMOVATE) 0.05 %, Apply  1 application topically as needed (for skin rash)., Disp: 30 g, Rfl: 1   empagliflozin (JARDIANCE) 10 MG TABS tablet, Take 1 tablet (10 mg total) by mouth daily., Disp: 90 tablet, Rfl: 2   fluticasone (FLONASE) 50 MCG/ACT nasal spray, Use 2 spray(s) in each nostril once daily, Disp: 48 g, Rfl: 3   glucose blood (ONETOUCH ULTRA) test strip, Use as instructed, Disp: 100 each, Rfl: 12   hydrOXYzine (ATARAX/VISTARIL) 25 MG tablet, Take 1 tablet (25 mg total) by mouth every 8 (eight) hours as needed for itching., Disp: 30 tablet, Rfl: 3   Ivermectin 1 % CREA, Apply 1 application topically daily as needed (rosacea). To face, Disp: , Rfl:    Lancets (ONETOUCH ULTRASOFT) lancets, Use as instructed, Disp: 100 each, Rfl: 12   lidocaine-prilocaine (EMLA) cream, Apply 1 application topically as needed (port access)., Disp: 1 g, Rfl: 3   losartan (COZAAR) 50 MG tablet, TAKE 1 TABLET BY MOUTH ONCE DAILY (SCHEDULE  OFFICE  VISIT  FOR  FURTHER  REFILLS), Disp: 90 tablet, Rfl: 0   magnesium hydroxide (MILK OF MAGNESIA) 400 MG/5ML suspension, Take 15 mLs by mouth daily as needed for mild constipation., Disp: , Rfl:    metFORMIN (GLUCOPHAGE) 1000 MG tablet, TAKE 1 TABLET BY MOUTH TWICE DAILY WITH MEALS, Disp: 180 tablet, Rfl: 0   nystatin cream (MYCOSTATIN), Apply topically 2 (two) times daily. (Patient not taking: Reported on 02/04/2021), Disp: 15 g, Rfl: 1   OVER THE COUNTER MEDICATION, Apply 1 application topically daily as needed (pain). Outback topical pain relief, Disp: , Rfl:    polyethylene glycol (MIRALAX / GLYCOLAX) 17 g packet, Take 17 g  by mouth daily as needed., Disp: , Rfl:    pregabalin (LYRICA) 75 MG capsule, Take 1 capsule by mouth once daily, Disp: 90 capsule, Rfl: 0   psyllium (METAMUCIL) 58.6 % powder, Take 1 packet by mouth daily. (Patient not taking: Reported on 02/04/2021), Disp: , Rfl:    sertraline (ZOLOFT) 50 MG tablet, Take 1 tablet (50 mg total) by mouth daily., Disp: 90 tablet, Rfl: 3   vitamin B-12 (CYANOCOBALAMIN) 1000 MCG tablet, Take by mouth daily. Unsure dose, Disp: , Rfl:    vitamin C (ASCORBIC ACID) 500 MG tablet, Take 1,000-1,500 mg by mouth 2 (two) times daily. (Patient not taking: No sig reported), Disp: , Rfl:  No current facility-administered medications for this visit.  Facility-Administered Medications Ordered in Other Visits:    Influenza vac split quadrivalent PF (FLUZONE HIGH-DOSE) injection 0.5 mL, 0.5 mL, Intramuscular, Once, Karen Kitchens, NP  No results found.  No images are attached to the encounter.   CMP Latest Ref Rng & Units 02/04/2021  Glucose 70 - 99 mg/dL 208(H)  BUN 8 - 23 mg/dL 25(H)  Creatinine 0.61 - 1.24 mg/dL 0.91  Sodium 135 - 145 mmol/L 133(L)  Potassium 3.5 - 5.1 mmol/L 4.1  Chloride 98 - 111 mmol/L 96(L)  CO2 22 - 32 mmol/L 26  Calcium 8.9 - 10.3 mg/dL 9.6  Total Protein 6.5 - 8.1 g/dL -  Total Bilirubin 0.3 - 1.2 mg/dL -  Alkaline Phos 38 - 126 U/L -  AST 15 - 41 U/L -  ALT 0 - 44 U/L -   CBC Latest Ref Rng & Units 01/31/2021  WBC 4.0 - 10.5 K/uL 9.6  Hemoglobin 13.0 - 17.0 g/dL 11.4(L)  Hematocrit 39.0 - 52.0 % 34.2(L)  Platelets 150 - 400 K/uL 255     Observation/objective: Appears in no acute  distress over video visit today.  Breathing is nonlabored  Assessment and plan: Patient is a 75 year old male with history of metastatic upper urothelial carcinoma with metastases to intra-abdominal lymph nodes.  He is here for on treatment assessment prior to next cycle of Padcev  Patient has been receiving Padcev 1 week on 3 weeks of instead of 3 weeks on 1  week off to maintain his quality of life.  He also has low-volume disease which has remained in excellent control with this regimen.  Most recent scan showed increase in the size of the primary urothelial mass that he did undergo radiation treatment for that.  I will see him back in 4 weeks for cycle 8 of Padcev. Will check scans in 2 months  Follow-up instructions: As above  I discussed the assessment and treatment plan with the patient. The patient was provided an opportunity to ask questions and all were answered. The patient agreed with the plan and demonstrated an understanding of the instructions.   The patient was advised to call back or seek an in-person evaluation if the symptoms worsen or if the condition fails to improve as anticipated.  Visit Diagnosis: 1. Encounter for antineoplastic chemotherapy   2. Urothelial carcinoma of kidney, left ([place])   3. Chemotherapy-induced peripheral neuropathy ([place])     Dr. Randa Evens, MD, MPH [facility] Tel- [phone] 02/28/2021 1:52 PM

## 2021-02-28 NOTE — Patient Instructions (Signed)
[facility]   Discharge Instructions: Thank you for choosing [facility] to provide your oncology and hematology care.  If you have a lab appointment with the Cancer Center, please go directly to the Cancer Center and check in at the registration area.  Wear comfortable clothing and clothing appropriate for easy access to any Portacath or PICC line.   We strive to give you quality time with your provider. You may need to reschedule your appointment if you arrive late (15 or more minutes).  Arriving late affects you and other patients whose appointments are after yours.  Also, if you miss three or more appointments without notifying the office, you may be dismissed from the clinic at the provider's discretion.      For prescription refill requests, have your pharmacy contact our office and allow 72 hours for refills to be completed.    Today you received the following chemotherapy and/or immunotherapy agents: Padcev.      To help prevent nausea and vomiting after your treatment, we encourage you to take your nausea medication as directed.  BELOW ARE SYMPTOMS THAT SHOULD BE REPORTED IMMEDIATELY: *FEVER GREATER THAN 100.4 F (38 C) OR HIGHER *CHILLS OR SWEATING *NAUSEA AND VOMITING THAT IS NOT CONTROLLED WITH YOUR NAUSEA MEDICATION *UNUSUAL SHORTNESS OF BREATH *UNUSUAL BRUISING OR BLEEDING *URINARY PROBLEMS (pain or burning when urinating, or frequent urination) *BOWEL PROBLEMS (unusual diarrhea, constipation, pain near the anus) TENDERNESS IN MOUTH AND THROAT WITH OR WITHOUT PRESENCE OF ULCERS (sore throat, sores in mouth, or a toothache) UNUSUAL RASH, SWELLING OR PAIN  UNUSUAL VAGINAL DISCHARGE OR ITCHING   Items with * indicate a potential emergency and should be followed up as soon as possible or go to the Emergency Department if any problems should occur.  Please show the CHEMOTHERAPY ALERT CARD or IMMUNOTHERAPY ALERT CARD at check-in  to the Emergency Department and triage nurse.  Should you have questions after your visit or need to cancel or reschedule your appointment, please contact [facility]  [phone] and follow the prompts.  Office hours are 8:00 a.m. to 4:30 p.m. Monday - Friday. Please note that voicemails left after 4:00 p.m. may not be returned until the following business day.  We are closed weekends and major holidays. You have access to a nurse at all times for urgent questions. Please call the main number to the clinic [phone] and follow the prompts.  For any non-urgent questions, you may also contact your provider using MyChart. We now offer e-Visits for anyone 18 and older to request care online for non-urgent symptoms. For details visit [email or web address].   Also download the MyChart app! Go to the app store, search "MyChart", open the app, select [facility], and log in with your MyChart username and password.  Due to Covid, a mask is required upon entering the hospital/clinic. If you do not have a mask, one will be given to you upon arrival. For doctor visits, patients may have 1 support person aged 18 or older with them. For treatment visits, patients cannot have anyone with them due to current Covid guidelines and our immunocompromised population.  

## 2021-03-02 ENCOUNTER — Inpatient Hospital Stay: Payer: HMO | Admitting: Occupational Therapy

## 2021-03-05 ENCOUNTER — Other Ambulatory Visit: Payer: Self-pay | Admitting: Family Medicine

## 2021-03-05 DIAGNOSIS — E119 Type 2 diabetes mellitus without complications: Secondary | ICD-10-CM

## 2021-03-06 NOTE — Telephone Encounter (Signed)
Requested Prescriptions  Pending Prescriptions Disp Refills  . losartan (COZAAR) 50 MG tablet [Pharmacy Med Name: Losartan Potassium 50 MG Oral Tablet] 24 tablet 0    Sig: TAKE 1 TABLET BY MOUTH ONCE DAILY (SCHEDULE  OFFICE  VISIT  FOR  FURTHER  REFILLS)     Cardiovascular:  Angiotensin Receptor Blockers Passed - 03/05/2021  6:27 PM      Passed - Cr in normal range and within 180 days    Creatinine  Date Value Ref Range Status  02/03/2013 0.69 0.60 - 1.30 mg/dL Final   Creatinine, Ser  Date Value Ref Range Status  02/28/2021 0.78 0.61 - 1.24 mg/dL Final         Passed - K in normal range and within 180 days    Potassium  Date Value Ref Range Status  02/28/2021 4.2 3.5 - 5.1 mmol/L Final  02/03/2013 4.0 3.5 - 5.1 mmol/L Final         Passed - Patient is not pregnant      Passed - Last BP in normal range    BP Readings from Last 1 Encounters:  02/28/21 116/67         Passed - Valid encounter within last 6 months    Recent Outpatient Visits          3 months ago Type 2 diabetes mellitus without complication, without long-term current use of insulin ([place])   [facility] Jerrol Banana., MD   11 months ago Medicare annual wellness visit, subsequent   [facility] Rosanna Randy, Retia Passe., MD   1 year ago Type 2 diabetes mellitus without complication, without long-term current use of insulin [facility])   [facility] Jerrol Banana., MD   1 year ago Type 2 diabetes mellitus without complication, without long-term current use of insulin [facility])   [facility] Jerrol Banana., MD   1 year ago Essential hypertension   [facility] Jerrol Banana., MD      Future Appointments            In 3 weeks Jerrol Banana., MD [facility] [place]   In 3 weeks [place], NP [facility]           . JARDIANCE 10 MG TABS tablet  [Pharmacy Med Name: Jardiance 10 MG Oral Tablet] 24 tablet 0    Sig: Take 1 tablet by mouth once daily     Endocrinology:  Diabetes - SGLT2 Inhibitors Failed - 03/05/2021  6:27 PM      Failed - LDL in normal range and within 360 days    LDL Chol Calc (NIH)  Date Value Ref Range Status  12/25/2019 54 0 - 99 mg/dL Final         Passed - Cr in normal range and within 360 days    Creatinine  Date Value Ref Range Status  02/03/2013 0.69 0.60 - 1.30 mg/dL Final   Creatinine, Ser  Date Value Ref Range Status  02/28/2021 0.78 0.61 - 1.24 mg/dL Final         Passed - HBA1C is between 0 and 7.9 and within 180 days    Hemoglobin A1C  Date Value Ref Range Status  11/11/2020 7.1 (A) 4.0 - 5.6 % Final   Hgb A1c MFr Bld  Date Value Ref Range Status  12/25/2019 7.9 (H) 4.8 - 5.6 % Final  Comment:             Prediabetes: 5.7 - 6.4          Diabetes: >6.4          Glycemic control for adults with diabetes: <7.0          Passed - eGFR in normal range and within 360 days    EGFR (African American)  Date Value Ref Range Status  02/03/2013 >60  Final   GFR calc Af Amer  Date Value Ref Range Status  04/06/2020 >60 >60 mL/min Final   EGFR (Non-African Amer.)  Date Value Ref Range Status  02/03/2013 >60  Final    Comment:    eGFR values <68m/min/1.73 m2 may be an indication of chronic kidney disease (CKD). Calculated eGFR is useful in patients with stable renal function. The eGFR calculation will not be reliable in acutely ill patients when serum creatinine is changing rapidly. It is not useful in  patients on dialysis. The eGFR calculation may not be applicable to patients at the low and high extremes of body sizes, pregnant women, and vegetarians.    GFR, Estimated  Date Value Ref Range Status  02/28/2021 >60 >60 mL/min Final    Comment:    (NOTE) Calculated using the CKD-EPI Creatinine Equation (2021)          Passed - Valid encounter within last 6 months    Recent  Outpatient Visits          3 months ago Type 2 diabetes mellitus without complication, without long-term current use of insulin ([facility]   [facility]   11 months ago Medicare annual wellness visit, subsequent   [facility]   1 year ago Type 2 diabetes mellitus without complication, without long-term current use of insulin ([facility]   [facility]   1 year ago Type 2 diabetes mellitus without complication, without long-term current use of insulin ([facility]   [facility]   1 year ago Essential hypertension   [facility]      Future Appointments            In 3 weeks G[facility]rol Banana, [facility]  In 3 weeks [place], JKirt Boys NP [facility]

## 2021-03-09 ENCOUNTER — Other Ambulatory Visit: Payer: Self-pay

## 2021-03-09 ENCOUNTER — Inpatient Hospital Stay: Payer: HMO | Admitting: Occupational Therapy

## 2021-03-09 DIAGNOSIS — R531 Weakness: Secondary | ICD-10-CM

## 2021-03-09 DIAGNOSIS — R296 Repeated falls: Secondary | ICD-10-CM

## 2021-03-09 NOTE — Therapy (Signed)
[facility] [address] [place], [place], [place], [address] Phone: [phone]   Fax:  [phone]  Occupational Therapy Screen:  Patient Details  Name: Patrick Mcgee MRN: [id number] Date of Birth: 28-Sep-1945 No data recorded  Encounter Date: 03/09/2021   OT End of Session - 03/09/21 1251     Visit Number 0             Past Medical History:  Diagnosis Date   Acid reflux    Anxiety    Depression    Diabetes mellitus without complication ([place])    Hyperlipidemia    Hypertension    MRSA (methicillin resistant Staphylococcus aureus) infection 1992   history   Myocardial infarction ([place]) 1995   Sleep apnea    CPAP   Urothelial cancer ([place]) 11/2017   Left Urothelial mass, chemo tx's    Past Surgical History:  Procedure Laterality Date   CATARACT EXTRACTION Left    COLONOSCOPY  2010   [facility]   COLONOSCOPY WITH PROPOFOL N/A 10/04/2016   Procedure: COLONOSCOPY WITH PROPOFOL;  Surgeon: [name], MD;  Location: [place];  Service: Endoscopy;  Laterality: N/A;   [place], 2000, 2001, 2014   CYSTOSCOPY W/ RETROGRADES Bilateral 12/10/2017   Procedure: CYSTOSCOPY WITH RETROGRADE PYELOGRAM;  Surgeon: [name], MD;  Location: [facility];  Service: Urology;  Laterality: Bilateral;   CYSTOSCOPY W/ RETROGRADES Left 12/09/2018   Procedure: CYSTOSCOPY WITH RETROGRADE PYELOGRAM;  Surgeon: [name], MD;  Location: [facility];  Service: Urology;  Laterality: Left;   CYSTOSCOPY WITH BIOPSY Left 12/09/2018   Procedure: CYSTOSCOPY WITH URETERAL/RENAL PELVIC BIOPSY;  Surgeon: [name], MD;  Location: [facility];  Service: Urology;  Laterality: Left;   CYSTOSCOPY WITH STENT PLACEMENT Left 12/10/2017   Procedure: CYSTOSCOPY WITH STENT PLACEMENT;  Surgeon: [name], MD;  Location: [facility];  Service: Urology;  Laterality: Left;   CYSTOSCOPY WITH STENT PLACEMENT Left  12/09/2018   Procedure: CYSTOSCOPY WITH STENT PLACEMENT;  Surgeon: [name], MD;  Location: [facility];  Service: Urology;  Laterality: Left;   CYSTOSCOPY WITH URETEROSCOPY Left 12/09/2018   Procedure: CYSTOSCOPY WITH URETEROSCOPY;  Surgeon: [name], MD;  Location: [facility];  Service: Urology;  Laterality: Left;   EYE SURGERY Bilateral    cataract   INGUINAL HERNIA REPAIR Right 08/09/2015   Procedure: HERNIA REPAIR INGUINAL ADULT;  Surgeon: [name], MD;  Location: [facility];  Service: General;  Laterality: Right;   NASAL SINUS SURGERY     nuclear stress test     PORTA CATH INSERTION N/A 12/19/2017   Procedure: PORTA CATH INSERTION;  Surgeon: [name], MD;  Location: [place] [facility];  Service: Cardiovascular;  Laterality: N/A;   TOOTH EXTRACTION  05/28/2020   upper left and it was molars   URETERAL BIOPSY Left 12/10/2017   Procedure: URETERAL & renal PELVIS BIOPSY;  Surgeon: [name], MD;  Location: [facility];  Service: Urology;  Laterality: Left;   URETEROSCOPY Left 12/10/2017   Procedure: URETEROSCOPY;  Surgeon: [name], MD;  Location: [facility];  Service: Urology;  Laterality: Left;    There were no vitals filed for this visit.   Subjective Assessment - 03/09/21 1249     Subjective  I was doing better- until I had the last radiation and infusion -and just went down hill since then - my legs just feel weak and fatigue- I don't eat -  don't have appetite and sleep most of the day - I fell about 5-6x in the past 3-6 months    Currently in Pain? No/denies                  NOTE FROM JOSH BORDERS NP 02/04/21: Assessment and Plan- Patient is a 75 y.o. 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  He received 1 L normal saline via IV.  Patient felt much improved following his IV fluids with  normalization of his blood pressure.  Encouraged oral fluids.  Patient to monitor his BP at home and discussed parameters for holding losartan if needed.  Discussed parameters for utilization of ER or urgent care over the weekend if needed.  We could also see him early next week in the clinic for repeat labs/fluids if needed.   Weakness -referral for rehab screening.  Patient is interested in ambulatory PT at the hospital.  OT SCREEN 03/09/21: Pt refer by Billey Chang for fatigue and weakness. Pt report he had about 5-6 falls in the last 3-6 months -last on in the walk in shower. Pt do not have appetite and is having hard time getting nutrition in and gaining weight. Report sleeping a lot since last round of radiation and increase weakness and fatigue. Pt lives in 2 story house but bedroom downstairs and have entrance to house with no steps.  Do not walk in yard or out to mailbox anymore. Pt show decrease strength in hips, and knees - neuropathy in both feet.History of drop foot on the R that he had PT for in the past. Report decrease UE strength and fatigue after doing bathing , shaving and dressing. Decrease UE over head AROM with some pain in shoulder radiating down deltoid. Pt score on BERG  balance test 42/56  Unable to sit<> stand without pushing up with hands,  needs hands with transfers, cannot maintain balance doing heal to toe stands, standing on leg and alternating placing foot on stool and keep balance standing with eyes close. Pt is on border between med and low risk for falling.  Would recommend for pt PT eval for balance and strengthening - he had it in the past and found it beneficial - and using the machines. Feels he will do better doing exercirese with PT than at home. Provided pt some HEP to do for the next week or 2 until getting in with PT: -SIT<> stand out of chair using pillows and no hands 12 reps -Calf stretches prior to standing at sink doing toes raises and heel lifts 12  reps -standing at sink -and do hip ABD but tap with foot 12 reps - each side  -side stepping along counter - 1-2 min  All 2 x day  When going to bathroom - walk in the house - circles and about 2 min -and increase gradually  YTB 2 x day - scapula squeezes and shoulder extention 12 reps 2 sets - pain free  Pt to be active minimal of 24 min day - doing some exercises , walking  Follow up in 2 wks if he did not start PT yet                                Patient will benefit from skilled therapeutic intervention  in order to improve the following deficits and impairments:           Visit Diagnosis: Weakness  Repeated falls    Problem List Patient Active Problem List   Diagnosis Date Noted   Chest pain 03/04/2019   Anemia due to antineoplastic chemotherapy 03/03/2018   Cough 03/03/2018   Chemotherapy induced neutropenia ([place]) 01/22/2018   B12 deficiency 01/01/2018   Hypomagnesemia 12/21/2017   Encounter for antineoplastic chemotherapy 12/21/2017   Malignant neoplasm of kidney ([place])    Urothelial cancer ([place]) 12/18/2017   Malnutrition of moderate degree 12/18/2017   Therapeutic opioid induced constipation    Palliative care encounter    Dehydration 12/17/2017   Goals of care, counseling/discussion 12/13/2017   Urothelial carcinoma of kidney, left ([place]) 12/07/2017   Right inguinal hernia 07/17/2015   Family history of colon cancer 07/17/2015   Allergic rhinitis 11/12/2014   Airway hyperreactivity 11/12/2014   Atherosclerosis of coronary artery 11/12/2014   Cheilitis 11/12/2014   Narrowing of intervertebral disc space 11/12/2014   Deflected nasal septum 11/12/2014   HLD (hyperlipidemia) 11/12/2014   BP (high blood pressure) 11/12/2014   Adult hypothyroidism 11/12/2014   Sleep apnea 11/12/2014   Diabetes mellitus, type 2 ([place]) 11/12/2014   Degenerative disc disease, lumbar 09/24/2012   Furunculosis 04/23/2012    Rosalyn Gess  OTR/L,CLT 03/09/2021, 12:54 PM  [facility] [address] [place], [place], [address] Phone: [phone]   Fax:  [phone]  Name: Patrick Mcgee MRN: [id number] Date of Birth: 1945-11-03

## 2021-03-21 ENCOUNTER — Encounter: Payer: Self-pay | Admitting: Radiation Oncology

## 2021-03-21 ENCOUNTER — Other Ambulatory Visit: Payer: Self-pay | Admitting: Family Medicine

## 2021-03-21 ENCOUNTER — Ambulatory Visit
Admission: RE | Admit: 2021-03-21 | Discharge: 2021-03-21 | Disposition: A | Discharge status: Home / Self Care | Payer: HMO | Source: Ambulatory Visit | Attending: Radiation Oncology | Admitting: Radiation Oncology

## 2021-03-21 VITALS — BP 87/68 | HR 68 | Temp 97.4°F | Wt 151.2 lb

## 2021-03-21 DIAGNOSIS — C642 Malignant neoplasm of left kidney, except renal pelvis: Secondary | ICD-10-CM | POA: Diagnosis present

## 2021-03-21 DIAGNOSIS — Z923 Personal history of irradiation: Secondary | ICD-10-CM | POA: Diagnosis not present

## 2021-03-21 NOTE — Progress Notes (Signed)
Radiation Oncology Follow up Note  Name: Patrick Mcgee   Date:   03/21/2021 MRN:  [id number] DOB: Jun 17, 1946    This 75 y.o. male presents to the clinic today for 1 month follow-up status post SBRT to his lower lobe of the left kidney for palliation and patient with known stage IV urothelial carcinoma.  REFERRING PROVIDER: Jerrol Banana.,*  HPI: Patient is a 75 year old male now out 1 month having completed SBRT to his left kidney for progression of urethral carcinoma.  Seen today in routine follow-up he is fairly weak has a poor appetite.  He is currently on.Padcev which she is tolerating fairly well.  He specifically denies abdominal pain hematuria or any other abdominal complaints.  He is currently on his seventh cycle of treatment.  He is scheduled in October for repeat abdominal scans.  COMPLICATIONS OF TREATMENT: none  FOLLOW UP COMPLIANCE: keeps appointments   PHYSICAL EXAM:  BP (!) 87/68   Pulse 68   Temp (!) 97.4 F (36.3 C) (Tympanic)   Wt 151 lb 3.2 oz (68.6 kg)   BMI 22.99 kg/m  Frail-appearing thin male in NAD.  Well-developed well-nourished patient in NAD. HEENT reveals PERLA, EOMI, discs not visualized.  Oral cavity is clear. No oral mucosal lesions are identified. Neck is clear without evidence of cervical or supraclavicular adenopathy. Lungs are clear to A&P. Cardiac examination is essentially unremarkable with regular rate and rhythm without murmur rub or thrill. Abdomen is benign with no organomegaly or masses noted. Motor sensory and DTR levels are equal and symmetric in the upper and lower extremities. Cranial nerves II through XII are grossly intact. Proprioception is intact. No peripheral adenopathy or edema is identified. No motor or sensory levels are noted. Crude visual fields are within normal range.  RADIOLOGY RESULTS: No current films to review  PLAN: Present time patient is clinically stable he continues immunotherapy under Dr. Elroy Channel direction.  I  have asked to see him back in 4 to 5 months for follow-up.  I have asked him to copy me any scans that he has for my evaluation in the near future.  Patient and wife know to call with any concerns at any time.  He has had extremely low side effect profile from his SBRT treatment.  I would like to take this opportunity to thank you for allowing me to participate in the care of your patient.Noreene Filbert, MD

## 2021-03-22 ENCOUNTER — Telehealth: Payer: Self-pay | Admitting: Occupational Therapy

## 2021-03-22 NOTE — Telephone Encounter (Signed)
Patient left voicemail on 9/12 to cancel his 9/14 appointment with [facility].  I called patient to clarify whether or not he wanted to reschedule.

## 2021-03-23 ENCOUNTER — Inpatient Hospital Stay: Payer: HMO | Admitting: Occupational Therapy

## 2021-03-28 ENCOUNTER — Encounter: Payer: Self-pay | Admitting: Oncology

## 2021-03-28 ENCOUNTER — Inpatient Hospital Stay: Payer: HMO

## 2021-03-28 ENCOUNTER — Inpatient Hospital Stay: Payer: HMO | Attending: Internal Medicine

## 2021-03-28 ENCOUNTER — Inpatient Hospital Stay (HOSPITAL_BASED_OUTPATIENT_CLINIC_OR_DEPARTMENT_OTHER): Payer: HMO | Admitting: Oncology

## 2021-03-28 VITALS — BP 100/72 | HR 68 | Temp 97.6°F | Resp 16 | Wt 149.0 lb

## 2021-03-28 DIAGNOSIS — C642 Malignant neoplasm of left kidney, except renal pelvis: Secondary | ICD-10-CM

## 2021-03-28 DIAGNOSIS — D701 Agranulocytosis secondary to cancer chemotherapy: Secondary | ICD-10-CM

## 2021-03-28 DIAGNOSIS — Z5112 Encounter for antineoplastic immunotherapy: Secondary | ICD-10-CM | POA: Diagnosis not present

## 2021-03-28 DIAGNOSIS — T451X5A Adverse effect of antineoplastic and immunosuppressive drugs, initial encounter: Secondary | ICD-10-CM

## 2021-03-28 LAB — COMPREHENSIVE METABOLIC PANEL
ALT: 9 U/L (ref 0–44)
AST: 15 U/L (ref 15–41)
Albumin: 3.7 g/dL (ref 3.5–5.0)
Alkaline Phosphatase: 98 U/L (ref 38–126)
Anion gap: 9 (ref 5–15)
BUN: 20 mg/dL (ref 8–23)
CO2: 26 mmol/L (ref 22–32)
Calcium: 9.6 mg/dL (ref 8.9–10.3)
Chloride: 101 mmol/L (ref 98–111)
Creatinine, Ser: 1.02 mg/dL (ref 0.61–1.24)
GFR, Estimated: >60 mL/min (ref >60)
Glucose, Bld: 133 mg/dL — ABNORMAL HIGH (ref 70–99)
Potassium: 4.2 mmol/L (ref 3.5–5.1)
Sodium: 136 mmol/L (ref 135–145)
Total Bilirubin: 1.2 mg/dL (ref 0.3–1.2)
Total Protein: 7.7 g/dL (ref 6.5–8.1)

## 2021-03-28 LAB — CBC WITH DIFFERENTIAL/PLATELET
Abs Immature Granulocytes: 0.02 10*3/uL (ref 0.00–0.07)
Basophils Absolute: 0.1 10*3/uL (ref 0.0–0.1)
Basophils Relative: 1 %
Eosinophils Absolute: 0.2 10*3/uL (ref 0.0–0.5)
Eosinophils Relative: 3 %
HCT: 34.4 % — ABNORMAL LOW (ref 39.0–52.0)
Hemoglobin: 11.2 g/dL — ABNORMAL LOW (ref 13.0–17.0)
Immature Granulocytes: 0 %
Lymphocytes Relative: 15 %
Lymphs Abs: 0.9 10*3/uL (ref 0.7–4.0)
MCH: 27.9 pg (ref 26.0–34.0)
MCHC: 32.6 g/dL (ref 30.0–36.0)
MCV: 85.6 fL (ref 80.0–100.0)
Monocytes Absolute: 0.6 10*3/uL (ref 0.1–1.0)
Monocytes Relative: 10 %
Neutro Abs: 4.5 10*3/uL (ref 1.7–7.7)
Neutrophils Relative %: 71 %
Platelets: 222 10*3/uL (ref 150–400)
RBC: 4.02 MIL/uL — ABNORMAL LOW (ref 4.22–5.81)
RDW: 16.8 % — ABNORMAL HIGH (ref 11.5–15.5)
WBC: 6.3 10*3/uL (ref 4.0–10.5)
nRBC: 0 % (ref 0.0–0.2)

## 2021-03-28 MED ORDER — SODIUM CHLORIDE 0.9 % IV SOLN
Freq: Once | INTRAVENOUS | Status: AC
  Filled 2021-03-28: qty 250

## 2021-03-28 MED ORDER — PALONOSETRON HCL INJECTION 0.25 MG/5ML
0.2500 mg | Freq: Once | INTRAVENOUS | Status: AC
  Administered 2021-03-28: 0.25 mg via INTRAVENOUS
  Filled 2021-03-28: qty 5

## 2021-03-28 MED ORDER — HEPARIN SOD (PORK) LOCK FLUSH 100 UNIT/ML IV SOLN
500.0000 [IU] | Freq: Once | INTRAVENOUS | Status: DC | PRN
  Filled 2021-03-28: qty 5

## 2021-03-28 MED ORDER — SODIUM CHLORIDE 0.9 % IV SOLN
1.0000 mg/kg | Freq: Once | INTRAVENOUS | Status: AC
  Administered 2021-03-28: 70 mg via INTRAVENOUS
  Filled 2021-03-28: qty 3

## 2021-03-28 MED ORDER — HEPARIN SOD (PORK) LOCK FLUSH 100 UNIT/ML IV SOLN
INTRAVENOUS | Status: AC
  Administered 2021-03-28: 500 [IU]
  Filled 2021-03-28: qty 5

## 2021-03-28 NOTE — Patient Instructions (Signed)
CANCER CENTER [place] [facility]   Discharge Instructions: Thank you for choosing [facility] to provide your oncology and hematology care.  If you have a lab appointment with the Cancer Center, please go directly to the Cancer Center and check in at the registration area.  Wear comfortable clothing and clothing appropriate for easy access to any Portacath or PICC line.   We strive to give you quality time with your provider. You may need to reschedule your appointment if you arrive late (15 or more minutes).  Arriving late affects you and other patients whose appointments are after yours.  Also, if you miss three or more appointments without notifying the office, you may be dismissed from the clinic at the provider's discretion.      For prescription refill requests, have your pharmacy contact our office and allow 72 hours for refills to be completed.    Today you received the following chemotherapy and/or immunotherapy agents: Padcev.      To help prevent nausea and vomiting after your treatment, we encourage you to take your nausea medication as directed.  BELOW ARE SYMPTOMS THAT SHOULD BE REPORTED IMMEDIATELY: *FEVER GREATER THAN 100.4 F (38 C) OR HIGHER *CHILLS OR SWEATING *NAUSEA AND VOMITING THAT IS NOT CONTROLLED WITH YOUR NAUSEA MEDICATION *UNUSUAL SHORTNESS OF BREATH *UNUSUAL BRUISING OR BLEEDING *URINARY PROBLEMS (pain or burning when urinating, or frequent urination) *BOWEL PROBLEMS (unusual diarrhea, constipation, pain near the anus) TENDERNESS IN MOUTH AND THROAT WITH OR WITHOUT PRESENCE OF ULCERS (sore throat, sores in mouth, or a toothache) UNUSUAL RASH, SWELLING OR PAIN  UNUSUAL VAGINAL DISCHARGE OR ITCHING   Items with * indicate a potential emergency and should be followed up as soon as possible or go to the Emergency Department if any problems should occur.  Please show the CHEMOTHERAPY ALERT CARD or IMMUNOTHERAPY ALERT CARD at check-in  to the Emergency Department and triage nurse.  Should you have questions after your visit or need to cancel or reschedule your appointment, please contact CANCER CENTER [place] [facility]  [phone] and follow the prompts.  Office hours are 8:00 a.m. to 4:30 p.m. Monday - Friday. Please note that voicemails left after 4:00 p.m. may not be returned until the following business day.  We are closed weekends and major holidays. You have access to a nurse at all times for urgent questions. Please call the main number to the clinic [phone] and follow the prompts.  For any non-urgent questions, you may also contact your provider using MyChart. We now offer e-Visits for anyone 18 and older to request care online for non-urgent symptoms. For details visit [email or web address].   Also download the MyChart app! Go to the app store, search "MyChart", open the app, select [place], and log in with your MyChart username and password.  Due to Covid, a mask is required upon entering the hospital/clinic. If you do not have a mask, one will be given to you upon arrival. For doctor visits, patients may have 1 support person aged 18 or older with them. For treatment visits, patients cannot have anyone with them due to current Covid guidelines and our immunocompromised population.  

## 2021-03-28 NOTE — Progress Notes (Signed)
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 [name] so far for his metastatic urothelial carcinoma.  This was originally diagnosed in May 2019.  He was noted to have a filling defect in the lower pole collecting system of the left kidney along with para-aortic and retroperitoneal adenopathy concerning for metastatic disease.  He underwent left ureteroscopy and renal pelvis biopsy which revealed small fragments of high-grade urothelial carcinoma with small focus of invasion.  He was started on carboplatin and gemcitabine chemotherapy in June 2019 and was continued on 05/27/2018 for 8 cycles.  He tolerated chemotherapy well except for chemo-induced anemia for which she has been getting Procrit every 2 weeks.  He did have response to his disease based on scans in August 2019.  However repeat scan on 05/31/2018 showed increase in the size of the primary tumor from 1.2 to 1.9 cm and increase in left para-aortic adenopathy from 0.9 to 1.3 cm.  Periportal adenopathy was stable at 1.4 cm.  Second line immunotherapy was recommended.  His initial biopsy specimen did not have enough sample to undergo FGFR mutation testing.  He also has mild interstitial lung disease for which he sees  pulmonary but he is not on home oxygen.  He has B12 deficiency for which he is on oral B12.  Also has diabetes and coronary artery disease.  Tecentriq started on 06/17/2018.  Patient noted to have disease progression in his lymph nodes in May 2020.  Repeat biopsy showed metastatic urothelial carcinoma which did not have a FGFR mutation.  He has been started on third line Padcev   Treatment on hold since April 2021 due to neuropathy and to maintain quality of life but restarted in August 2021 after he was found to have progression of disease which he has 1 week on 3 weeks off   CT scan in June 2022 showed increase in the size of primary urothelial mass but no other evidence of metastatic disease elsewhere.  He completed palliative radiation to the mass in July 2022    Interval history-has baseline fatigue that is unchanged.  Sleeping and eating habits are regular.  He sleeps mostly during the day and stays up at night.  Appetite is fair.  He has stopped drinking his protein drinks.  Neuropathy is stable  ECOG PS- 2 Pain scale- 2   Review of systems- Review of Systems  Constitutional:  Positive for malaise/fatigue and weight loss. Negative for chills and fever.  HENT:  Negative for congestion, ear discharge and nosebleeds.   Eyes:  Negative for blurred vision.  Respiratory:  Negative for cough, hemoptysis, sputum production, shortness of breath and wheezing.   Cardiovascular:  Negative for chest pain, palpitations, orthopnea and claudication.  Gastrointestinal:  Negative for abdominal pain, blood in stool, constipation, diarrhea, heartburn, melena, nausea and vomiting.  Genitourinary:  Negative for dysuria, flank pain, frequency,  hematuria and urgency.  Musculoskeletal:  Negative for back pain, joint pain and myalgias.  Skin:  Negative for rash.  Neurological:  Positive for sensory change (Peripheral neuropathy). Negative for dizziness, tingling, focal weakness, seizures, weakness and headaches.   Endo/Heme/Allergies:  Does not bruise/bleed easily.  Psychiatric/Behavioral:  Negative for depression and suicidal ideas. The patient does not have insomnia.      Allergies  Allergen Reactions   Sulfa Antibiotics Other (See Comments)    Joint pain   Ace Inhibitors Cough   Invokana [Canagliflozin] Other (See Comments)    Leg pain   Penicillins Rash    Did it involve swelling of the face/tongue/throat, SOB, or low BP? No Did it involve sudden or severe rash/hives, skin peeling, or any reaction on the inside of your mouth or nose? Yes Did you need to seek medical attention at a hospital or doctor's office? Yes When did it last happen?      childhood allergy If all above answers are "NO", may proceed with cephalosporin use.      Past Medical History:  Diagnosis Date   Acid reflux    Anxiety    Depression    Diabetes mellitus without complication (HCC)    Hyperlipidemia    Hypertension    MRSA (methicillin resistant Staphylococcus aureus) infection 1992   history   Myocardial infarction (HCC) 1995   Sleep apnea    CPAP   Urothelial cancer (HCC) 11/2017   Left Urothelial mass, chemo tx's     Past Surgical History:  Procedure Laterality Date   CATARACT EXTRACTION Left    COLONOSCOPY  2010   [facility]   COLONOSCOPY WITH PROPOFOL N/A 10/04/2016   Procedure: COLONOSCOPY WITH PROPOFOL;  Surgeon: Scot Jun, MD;  Location: [facility] ENDOSCOPY;  Service: Endoscopy;  Laterality: N/A;   CORONARY ANGIOPLASTY WITH STENT PLACEMENT  1995, 1998, 2000, 2001, 2014   CYSTOSCOPY W/ RETROGRADES Bilateral 12/10/2017   Procedure: CYSTOSCOPY WITH RETROGRADE PYELOGRAM;  Surgeon: Vanna Scotland, MD;  Location: [facility] ORS;  Service: Urology;  Laterality: Bilateral;   CYSTOSCOPY W/ RETROGRADES Left 12/09/2018   Procedure: CYSTOSCOPY WITH RETROGRADE PYELOGRAM;  Surgeon: Vanna Scotland, MD;  Location: [facility] ORS;  Service: Urology;  Laterality: Left;   CYSTOSCOPY WITH BIOPSY Left 12/09/2018   Procedure:  CYSTOSCOPY WITH URETERAL/RENAL PELVIC BIOPSY;  Surgeon: Vanna Scotland, MD;  Location: [facility] ORS;  Service: Urology;  Laterality: Left;   CYSTOSCOPY WITH STENT PLACEMENT Left 12/10/2017   Procedure: CYSTOSCOPY WITH STENT PLACEMENT;  Surgeon: Vanna Scotland, MD;  Location: [facility] ORS;  Service: Urology;  Laterality: Left;   CYSTOSCOPY WITH STENT PLACEMENT Left 12/09/2018   Procedure: CYSTOSCOPY WITH STENT PLACEMENT;  Surgeon: Vanna Scotland, MD;  Location: [facility] ORS;  Service: Urology;  Laterality: Left;   CYSTOSCOPY WITH URETEROSCOPY Left 12/09/2018   Procedure: CYSTOSCOPY WITH URETEROSCOPY;  Surgeon: Vanna Scotland, MD;  Location: [facility] ORS;  Service: Urology;  Laterality: Left;   EYE SURGERY Bilateral    cataract   INGUINAL HERNIA REPAIR Right 08/09/2015   Procedure: HERNIA REPAIR INGUINAL ADULT;  Surgeon: Earline Mayotte, MD;  Location: [facility] ORS;  Service: General;  Laterality: Right;   NASAL SINUS SURGERY     nuclear stress test     PORTA CATH INSERTION N/A 12/19/2017   Procedure: PORTA CATH INSERTION;  Surgeon: Annice Needy, MD;  Location: [facility] INVASIVE CV LAB;  Service: Cardiovascular;  Laterality: N/A;   TOOTH EXTRACTION  05/28/2020   upper left and it was molars  URETERAL BIOPSY Left 12/10/2017   Procedure: URETERAL & renal PELVIS BIOPSY;  Surgeon: [name], MD;  Location: [facility] ORS;  Service: Urology;  Laterality: Left;   URETEROSCOPY Left 12/10/2017   Procedure: URETEROSCOPY;  Surgeon: [name], MD;  Location: [facility] ORS;  Service: Urology;  Laterality: Left;    Social History   Socioeconomic History   Marital status: Married    Spouse name: [name]   Number of children: 1   Years of education: Not on file   Highest education level: Associate degree: occupational, Hotel manager, or vocational program  Occupational History   Occupation: retired  Tobacco Use   Smoking status: Former    Types: Cigars    Quit date: 10/09/1978    Years since quitting: 42.4   Smokeless tobacco:  Former    Types: Chew    Quit date: 12/04/1988   Tobacco comments:    on occasion  Vaping Use   Vaping Use: Never used  Substance and Sexual Activity   Alcohol use: Yes    Comment: beer once a week   Drug use: No   Sexual activity: Not Currently  Other Topics Concern   Not on file  Social History Narrative   Not on file   Social Determinants of Health   Financial Resource Strain: Low Risk    Difficulty of Paying Living Expenses: Not hard at all  Food Insecurity: No Food Insecurity   Worried About Charity fundraiser in the Last Year: Never true   [place] in the Last Year: Never true  Transportation Needs: No Transportation Needs   Lack of Transportation (Medical): No   Lack of Transportation (Non-Medical): No  Physical Activity: Insufficiently Active   Days of Exercise per Week: 3 days   Minutes of Exercise per Session: 30 min  Stress: No Stress Concern Present   Feeling of Stress : Not at all  Social Connections: Moderately Integrated   Frequency of Communication with Friends and Family: Three times a week   Frequency of Social Gatherings with Friends and Family: More than three times a week   Attends Religious Services: More than 4 times per year   Active Member of [employer] or Organizations: No   Attends Music therapist: Never   Marital Status: Married  Human resources officer Violence: Not At Risk   Fear of Current or Ex-Partner: No   Emotionally Abused: No   Physically Abused: No   Sexually Abused: No    Family History  Problem Relation Age of Onset   Heart disease Mother    Cancer Father        Lung and colon cancer   Heart disease Father    Emphysema Maternal Grandfather    Tuberculosis Maternal Grandmother      Current Outpatient Medications:    aspirin EC 81 MG tablet, Take 81 mg by mouth every evening. , Disp: , Rfl:    atorvastatin (LIPITOR) 40 MG tablet, TAKE 1 TABLET BY MOUTH AT BEDTIME, Disp: 90 tablet, Rfl: 0   blood glucose meter  kit and supplies, Dispense based on patient and insurance preference. Use once daily as directed. (FOR ICD-10 E11.9)., Disp: 1 each, Rfl: 0   Blood Glucose Monitoring Suppl (ONE TOUCH ULTRA 2) w/Device KIT, 1 each by Does not apply route once for 1 dose. Test blood sugars once daily, Disp: 1 kit, Rfl: 0   Cholecalciferol (VITAMIN D3) 25 MCG (1000 UT) CAPS, Take 1 capsule by mouth daily., Disp: ,  Rfl:    fluticasone (FLONASE) 50 MCG/ACT nasal spray, Use 2 spray(s) in each nostril once daily, Disp: 48 g, Rfl: 3   glucose blood (ONETOUCH ULTRA) test strip, Use as instructed, Disp: 100 each, Rfl: 12   hydrOXYzine (ATARAX/VISTARIL) 25 MG tablet, Take 1 tablet (25 mg total) by mouth every 8 (eight) hours as needed for itching., Disp: 30 tablet, Rfl: 3   JARDIANCE 10 MG TABS tablet, Take 1 tablet by mouth once daily, Disp: 24 tablet, Rfl: 0   Lancets (ONETOUCH ULTRASOFT) lancets, Use as instructed, Disp: 100 each, Rfl: 12   losartan (COZAAR) 50 MG tablet, TAKE 1 TABLET BY MOUTH ONCE DAILY (SCHEDULE  OFFICE  VISIT  FOR  FURTHER  REFILLS), Disp: 24 tablet, Rfl: 0   magnesium hydroxide (MILK OF MAGNESIA) 400 MG/5ML suspension, Take 15 mLs by mouth daily as needed for mild constipation., Disp: , Rfl:    metFORMIN (GLUCOPHAGE) 1000 MG tablet, TAKE 1 TABLET BY MOUTH TWICE DAILY WITH MEALS, Disp: 180 tablet, Rfl: 0   OVER THE COUNTER MEDICATION, Apply 1 application topically daily as needed (pain). Outback topical pain relief, Disp: , Rfl:    polyethylene glycol (MIRALAX / GLYCOLAX) 17 g packet, Take 17 g by mouth daily as needed., Disp: , Rfl:    pregabalin (LYRICA) 75 MG capsule, Take 1 capsule by mouth once daily, Disp: 90 capsule, Rfl: 0   sertraline (ZOLOFT) 50 MG tablet, Take 1 tablet (50 mg total) by mouth daily., Disp: 90 tablet, Rfl: 3   vitamin B-12 (CYANOCOBALAMIN) 1000 MCG tablet, Take by mouth daily. Unsure dose, Disp: , Rfl:    vitamin C (ASCORBIC ACID) 500 MG tablet, Take 1,000-1,500 mg by mouth 2  (two) times daily., Disp: , Rfl:    clobetasol cream (TEMOVATE) 6.50 %, Apply 1 application topically as needed (for skin rash). (Patient not taking: Reported on 03/28/2021), Disp: 30 g, Rfl: 1   Ivermectin 1 % CREA, Apply 1 application topically daily as needed (rosacea). To face (Patient not taking: Reported on 03/28/2021), Disp: , Rfl:    lidocaine-prilocaine (EMLA) cream, Apply 1 application topically as needed (port access). (Patient not taking: Reported on 03/28/2021), Disp: 1 g, Rfl: 3   nystatin cream (MYCOSTATIN), Apply topically 2 (two) times daily. (Patient not taking: No sig reported), Disp: 15 g, Rfl: 1   psyllium (METAMUCIL) 58.6 % powder, Take 1 packet by mouth daily. (Patient not taking: No sig reported), Disp: , Rfl:  No current facility-administered medications for this visit.  Facility-Administered Medications Ordered in Other Visits:    heparin lock flush 100 unit/mL, 500 Units, Intracatheter, Once PRN, Sindy Guadeloupe, MD   Influenza vac split quadrivalent PF (FLUZONE HIGH-DOSE) injection 0.5 mL, 0.5 mL, Intramuscular, Once, Karen Kitchens, NP  Physical exam:  Vitals:   03/28/21 1441  BP: 100/72  Pulse: 68  Resp: 16  Temp: 97.6 F (36.4 C)  TempSrc: Tympanic  Weight: 149 lb (67.6 kg)   Physical Exam Constitutional:      General: He is not in acute distress. Cardiovascular:     Rate and Rhythm: Normal rate and regular rhythm.     Heart sounds: Normal heart sounds.  Pulmonary:     Effort: Pulmonary effort is normal.     Breath sounds: Normal breath sounds.  Abdominal:     General: Bowel sounds are normal.     Palpations: Abdomen is soft.  Skin:    General: Skin is warm and dry.  Neurological:  Mental Status: He is alert and oriented to person, place, and time.     CMP Latest Ref Rng & Units 03/28/2021  Glucose 70 - 99 mg/dL 133(H)  BUN 8 - 23 mg/dL 20  Creatinine 0.61 - 1.24 mg/dL 1.02  Sodium 135 - 145 mmol/L 136  Potassium 3.5 - 5.1 mmol/L 4.2   Chloride 98 - 111 mmol/L 101  CO2 22 - 32 mmol/L 26  Calcium 8.9 - 10.3 mg/dL 9.6  Total Protein 6.5 - 8.1 g/dL 7.7  Total Bilirubin 0.3 - 1.2 mg/dL 1.2  Alkaline Phos 38 - 126 U/L 98  AST 15 - 41 U/L 15  ALT 0 - 44 U/L 9   CBC Latest Ref Rng & Units 03/28/2021  WBC 4.0 - 10.5 K/uL 6.3  Hemoglobin 13.0 - 17.0 g/dL 11.2(L)  Hematocrit 39.0 - 52.0 % 34.4(L)  Platelets 150 - 400 K/uL 222     Assessment and plan- Patient is a 75 y.o. 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  He is here for on treatment assessment prior to next cycle of Padcev  Counts okay to proceed with next cycle of Padcev today.  I will see him back in 4 weeks for the following cycle.  We will get CT chest abdomen and pelvis with contrast prior.  Patient has only been receiving treatment 1 week on and 3 weeks off to maintain his quality of life.  He has had foot drop in the past secondary to peripheral neuropathy from [facility] and therefore could not continue the 3-week on 1 week off regimen.   Visit Diagnosis 1. Urothelial carcinoma of kidney, left (HCC)      Dr. Randa Evens, MD, MPH [facility] at [facility] [phone] 03/28/2021 4:24 PM

## 2021-03-28 NOTE — Progress Notes (Signed)
Still resting a lot, does not eat until afternoon and eats at night.

## 2021-03-29 ENCOUNTER — Ambulatory Visit (INDEPENDENT_AMBULATORY_CARE_PROVIDER_SITE_OTHER): Payer: HMO | Admitting: Family Medicine

## 2021-03-29 ENCOUNTER — Other Ambulatory Visit: Payer: Self-pay

## 2021-03-29 ENCOUNTER — Encounter: Payer: Self-pay | Admitting: Family Medicine

## 2021-03-29 VITALS — BP 115/76 | HR 75 | Temp 98.4°F | Resp 18 | Ht 68.0 in | Wt 153.0 lb

## 2021-03-29 DIAGNOSIS — I10 Essential (primary) hypertension: Secondary | ICD-10-CM | POA: Diagnosis not present

## 2021-03-29 DIAGNOSIS — I2510 Atherosclerotic heart disease of native coronary artery without angina pectoris: Secondary | ICD-10-CM

## 2021-03-29 DIAGNOSIS — E039 Hypothyroidism, unspecified: Secondary | ICD-10-CM

## 2021-03-29 DIAGNOSIS — E785 Hyperlipidemia, unspecified: Secondary | ICD-10-CM | POA: Diagnosis not present

## 2021-03-29 DIAGNOSIS — C642 Malignant neoplasm of left kidney, except renal pelvis: Secondary | ICD-10-CM

## 2021-03-29 DIAGNOSIS — Z0000 Encounter for general adult medical examination without abnormal findings: Secondary | ICD-10-CM

## 2021-03-29 DIAGNOSIS — Z23 Encounter for immunization: Secondary | ICD-10-CM

## 2021-03-29 DIAGNOSIS — E119 Type 2 diabetes mellitus without complications: Secondary | ICD-10-CM

## 2021-03-29 DIAGNOSIS — E538 Deficiency of other specified B group vitamins: Secondary | ICD-10-CM

## 2021-03-29 NOTE — Progress Notes (Signed)
I,April Miller,acting as a scribe for Wilhemena Durie, MD.,have documented all relevant documentation on the behalf of Wilhemena Durie, MD,as directed by  Wilhemena Durie, MD while in the presence of Wilhemena Durie, MD.   Annual Wellness Visit     Patient: Patrick Mcgee, Male    DOB: [birth year], 75 y.o.   MRN: [id number] Visit Date: 03/29/2021  Today's Provider: Wilhemena Durie, MD   Chief Complaint  Patient presents with   Medicare Wellness    Subjective    Patrick Mcgee is a 75 y.o. male who presents today for his Annual Wellness Visit.Annual Physical. He reports consuming a general and low sodium diet. The patient does not participate in regular exercise at present. He generally feels fairly well. He reports sleeping fairly well. He does not have additional problems to discuss today.  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 HPI    Medications: Outpatient Medications Prior to Visit  Medication Sig   aspirin EC 81 MG tablet Take 81 mg by mouth every evening.    atorvastatin (LIPITOR) 40 MG tablet TAKE 1 TABLET BY MOUTH AT BEDTIME   blood glucose meter kit and supplies Dispense based on patient and insurance preference. Use once daily as directed. (FOR ICD-10 E11.9).   Blood Glucose Monitoring Suppl (ONE TOUCH ULTRA 2) w/Device KIT 1 each by Does not apply route once for 1 dose. Test blood sugars once daily   Cholecalciferol (VITAMIN D3) 25 MCG (1000 UT) CAPS Take 1 capsule by mouth daily.   clobetasol cream (TEMOVATE) 3.81 % Apply 1 application topically as needed (for skin rash).   fluticasone (FLONASE) 50 MCG/ACT nasal spray Use 2 spray(s) in each nostril once daily   glucose blood (ONETOUCH ULTRA) test strip Use as instructed   hydrOXYzine (ATARAX/VISTARIL) 25 MG tablet Take 1 tablet (25 mg total) by mouth every 8 (eight) hours as needed for itching.   Ivermectin 1 % CREA Apply 1  application topically daily as needed (rosacea). To face   JARDIANCE 10 MG TABS tablet Take 1 tablet by mouth once daily   Lancets (ONETOUCH ULTRASOFT) lancets Use as instructed   lidocaine-prilocaine (EMLA) cream Apply 1 application topically as needed (port access).   losartan (COZAAR) 50 MG tablet TAKE 1 TABLET BY MOUTH ONCE DAILY (SCHEDULE  OFFICE  VISIT  FOR  FURTHER  REFILLS)   magnesium hydroxide (MILK OF MAGNESIA) 400 MG/5ML suspension Take 15 mLs by mouth daily as needed for mild constipation.   metFORMIN (GLUCOPHAGE) 1000 MG tablet TAKE 1 TABLET BY MOUTH TWICE DAILY WITH MEALS   nystatin cream (MYCOSTATIN) Apply topically 2 (two) times daily.   OVER THE COUNTER MEDICATION Apply 1 application topically daily as needed (pain). Outback topical pain relief   polyethylene glycol (MIRALAX / GLYCOLAX) 17 g packet Take 17 g by mouth daily as needed.   pregabalin (LYRICA) 75 MG capsule Take 1 capsule by mouth once daily   psyllium (METAMUCIL) 58.6 % powder Take 1 packet by mouth daily.   sertraline (ZOLOFT) 50 MG tablet Take 1 tablet (50 mg total) by mouth daily.   vitamin B-12 (CYANOCOBALAMIN) 1000 MCG tablet Take by mouth daily. Unsure dose   vitamin C (ASCORBIC ACID) 500 MG tablet Take 1,000-1,500 mg by mouth 2 (two) times daily.   Facility-Administered Medications Prior to Visit  Medication Dose Route Frequency Provider   Influenza vac split quadrivalent  PF (FLUZONE HIGH-DOSE) injection 0.5 mL  0.5 mL Intramuscular Once Karen Kitchens, NP    Allergies  Allergen Reactions   Sulfa Antibiotics Other (See Comments)    Joint pain   Ace Inhibitors Cough   Invokana [Canagliflozin] Other (See Comments)    Leg pain   Penicillins Rash    Did it involve swelling of the face/tongue/throat, SOB, or low BP? No Did it involve sudden or severe rash/hives, skin peeling, or any reaction on the inside of your mouth or nose? Yes Did you need to seek medical attention at a hospital or doctor's office?  Yes When did it last happen?      childhood allergy If all above answers are "NO", may proceed with cephalosporin use.     Patient Care Team: Jerrol Banana., MD as PCP - General (Family Medicine) Dingeldein, [name], MD as Consulting Physician (Ophthalmology) [name] as Consulting Physician (Internal Medicine) Sindy Guadeloupe, MD as Consulting Physician (Oncology) Borders, Kirt Boys, NP as Nurse Practitioner [facility] and Palliative Medicine)  Review of Systems  HENT:  Positive for dental problem and tinnitus.   Endocrine: Positive for polydipsia and polyuria.  All other systems reviewed and are negative.       Objective    Vitals: BP 115/76 (BP Location: Left Arm, Patient Position: Sitting, Cuff Size: Normal)   Pulse 75   Temp 98.4 F (36.9 C) (Oral)   Resp 18   Ht _0  (1.727 m)   Wt 153 lb (69.4 kg)   SpO2 95%   BMI 23.26 kg/m  BP Readings from Last 3 Encounters:  03/29/21 115/76  03/28/21 100/72  03/21/21 (!) 87/68   Wt Readings from Last 3 Encounters:  03/29/21 153 lb (69.4 kg)  03/28/21 149 lb (67.6 kg)  03/21/21 151 lb 3.2 oz (68.6 kg)      Physical Exam Vitals reviewed.  Constitutional:      Appearance: Normal appearance.  HENT:     Right Ear: Tympanic membrane normal.     Left Ear: Tympanic membrane normal.  Eyes:     Pupils: Pupils are equal, round, and reactive to light.  Cardiovascular:     Rate and Rhythm: Normal rate and regular rhythm.     Pulses: Normal pulses.     Heart sounds: Normal heart sounds.  Pulmonary:     Effort: Pulmonary effort is normal.     Breath sounds: Normal breath sounds.  Abdominal:     General: Bowel sounds are normal.  Skin:    General: Skin is warm and dry.  Neurological:     Mental Status: He is alert and oriented to person, place, and time. Mental status is at baseline.  Psychiatric:        Mood and Affect: Mood normal.        Behavior: Behavior normal.     Most recent functional status  assessment: In your present state of health, do you have any difficulty performing the following activities: 03/29/2021  Hearing? N  Vision? N  Difficulty concentrating or making decisions? N  Walking or climbing stairs? Y  Dressing or bathing? N  Doing errands, shopping? N  Some recent data might be hidden   Most recent fall risk assessment: Fall Risk  03/29/2021  Falls in the past year? 1  Comment -  Number falls in past yr: 1  Injury with Fall? 1  Risk for fall due to : History of fall(s)  Follow up Falls evaluation completed  Most recent depression screenings: PHQ 2/9 Scores 03/29/2021 03/24/2020  PHQ - 2 Score 0 0  PHQ- 9 Score 5 5   Most recent cognitive screening: 6CIT Screen 03/13/2017  What Year? 0 points  What month? 0 points  What time? 0 points  Count back from 20 0 points  Months in reverse 0 points  Repeat phrase 0 points  Total Score 0   Most recent Audit-C alcohol use screening Alcohol Use Disorder Test (AUDIT) 03/29/2021  1. How often do you have a drink containing alcohol? 2  2. How many drinks containing alcohol do you have on a typical day when you are drinking? 0  3. How often do you have six or more drinks on one occasion? 0  AUDIT-C Score 2  Alcohol Brief Interventions/Follow-up -   A score of 3 or more in women, and 4 or more in men indicates increased risk for alcohol abuse, EXCEPT if all of the points are from question 1   No results found for any visits on 03/29/21.  Assessment & Plan     Annual wellness visit done today including the all of the following: Reviewed patient's Family Medical History Reviewed and updated list of patient's medical providers Assessment of cognitive impairment was done Assessed patient's functional ability Established a written schedule for health screening [place] Completed and Reviewed  Exercise Activities and Dietary recommendations  Goals      Prevent falls     Recommend to remove  any items from the home that may cause slips or trips.        Immunization History  Administered Date(s) Administered   Fluad Quad(high Dose 65+) 03/19/2019, 03/24/2020, 03/29/2021   Influenza, High Dose Seasonal PF 06/20/2016, 03/13/2017, 05/06/2018   PFIZER(Purple Top)SARS-COV-2 Vaccination 07/16/2019, 08/06/2019, 08/18/2020   Pneumococcal Conjugate-13 01/29/2014   Pneumococcal Polysaccharide-23 09/06/2011   Tdap 08/27/2007   Zoster, Live 10/26/2010    Health Maintenance  Topic Date Due   Zoster Vaccines- Shingrix (1 of 2) Never done   COVID-19 Vaccine (4 - Booster for Pfizer series) 11/10/2020   FOOT EXAM  03/24/2021   TETANUS/TDAP  04/05/2021 (Originally 08/26/2017)   HEMOGLOBIN A1C  05/14/2021   OPHTHALMOLOGY EXAM  09/29/2021   COLONOSCOPY (Pts 45-29yr Insurance coverage will need to be confirmed)  10/04/2021   INFLUENZA VACCINE  Completed   Hepatitis C Screening  Completed   HPV VACCINES  Aged Out    1. Medicare annual wellness visit, subsequent  - Lipid panel - TSH - Folate - Hemoglobin A1c  2. Annual physical exam   3. Essential hypertension  - Lipid panel - TSH - Folate - Hemoglobin A1c  4. Hyperlipidemia, unspecified hyperlipidemia type  - Lipid panel - TSH - Folate - Hemoglobin A1c  5. Adult hypothyroidism  - Lipid panel - TSH - Folate - Hemoglobin A1c  6. Type 2 diabetes mellitus without complication, without long-term current use of insulin (HCC)  - Lipid panel - TSH - Folate - Hemoglobin A1c  7. B12 deficiency  - Lipid panel - TSH - Vitamin B12 - Folate - Hemoglobin A1c  8. Arteriosclerosis of coronary artery  - Lipid panel - TSH - Folate - Hemoglobin A1c  9. Need for immunization against influenza  - Flu Vaccine QUAD High Dose(Fluad)  10. Urothelial carcinoma of kidney, left (HCC)   Discussed health benefits of physical activity, and encouraged him to engage in regular exercise appropriate for his age and  condition.  Return in about 5 months (around 08/29/2021).     I, Wilhemena Durie, MD, have reviewed all documentation for this visit. The documentation on 04/02/21 for the exam, diagnosis, procedures, and orders are all accurate and complete.    Elmar Antigua Cranford Mon, MD  [facility] [phone] (phone) [phone] (fax)  [place]

## 2021-03-30 ENCOUNTER — Inpatient Hospital Stay (HOSPITAL_BASED_OUTPATIENT_CLINIC_OR_DEPARTMENT_OTHER): Payer: HMO | Admitting: Hospice and Palliative Medicine

## 2021-03-30 DIAGNOSIS — Z515 Encounter for palliative care: Secondary | ICD-10-CM

## 2021-03-30 DIAGNOSIS — C642 Malignant neoplasm of left kidney, except renal pelvis: Secondary | ICD-10-CM | POA: Diagnosis not present

## 2021-03-30 NOTE — Progress Notes (Signed)
Virtual Visit via Video Note  I connected with Patrick Mcgee on 03/30/21 at 11:30 AM EDT by a video enabled telemedicine application and verified that I am speaking with the correct person using two identifiers.   I discussed the limitations of evaluation and management by telemedicine and the availability of in person appointments. The patient expressed understanding and agreed to proceed.  History of Present Illness: [name] is a 75 y.o. 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  PMH is also notable for mild interstitial lung disease, diabetes, and CAD status post previous stenting.  Patient has been on maintenance Enfortumab.  Patient was referred to palliative care to help address goals and manage ongoing symptoms.   Observations/Objective: Patient reports that he has slowly declined in regards to appetite and functioning.  He is sleeping a lot during the day but is awake at night.  He was drinking oral supplements but has stopped those due to a national recall.  He intends to restart them in the future.  Patient is pending repeat CT next month and is interested in the results to facilitate decision-making.  He does endorse a desire to have quality of life and would not be interested in treatment that might be associated with too high symptomatic burden.  Patient continues to have strong faith in God regardless of the outcome.  Assessment and Plan: Stage IV urothelial cancer -on treatment with enfortumab.  Plan is for repeat CT next month.  Will follow.  Follow Up Instructions: Follow-up MyChart visit in 2 months   I discussed the assessment and treatment plan with the patient. The patient was provided an opportunity to ask questions and all were answered. The patient agreed with the plan and demonstrated an understanding of the instructions.   The patient was advised to  call back or seek an in-person evaluation if the symptoms worsen or if the condition fails to improve as anticipated.  I provided 10 minutes of non-face-to-face time during this encounter.   Irean Hong, NP

## 2021-04-06 ENCOUNTER — Telehealth: Payer: Self-pay

## 2021-04-06 DIAGNOSIS — I10 Essential (primary) hypertension: Secondary | ICD-10-CM

## 2021-04-06 MED ORDER — LOSARTAN POTASSIUM 50 MG PO TABS: 50.0000 mg | ORAL_TABLET | Freq: Every day | ORAL | 1 refills | Status: AC

## 2021-04-06 NOTE — Telephone Encounter (Signed)
[place] faxed refill request for the following medications:  losartan (COZAAR) 50 MG tablet  Request is for 90 day supply  Please advise.

## 2021-04-06 NOTE — Telephone Encounter (Signed)
Medication sent into the pharmacy. 

## 2021-04-19 ENCOUNTER — Other Ambulatory Visit: Payer: Self-pay | Admitting: Family Medicine

## 2021-04-19 ENCOUNTER — Other Ambulatory Visit: Payer: Self-pay | Admitting: Oncology

## 2021-04-20 ENCOUNTER — Telehealth: Payer: Self-pay | Admitting: *Deleted

## 2021-04-20 ENCOUNTER — Other Ambulatory Visit: Payer: Self-pay

## 2021-04-20 ENCOUNTER — Ambulatory Visit
Admission: RE | Admit: 2021-04-20 | Discharge: 2021-04-20 | Disposition: A | Discharge status: Home / Self Care | Payer: HMO | Source: Ambulatory Visit | Attending: Oncology | Admitting: Oncology

## 2021-04-20 DIAGNOSIS — C642 Malignant neoplasm of left kidney, except renal pelvis: Secondary | ICD-10-CM | POA: Diagnosis not present

## 2021-04-20 LAB — TSH: TSH: 3.51 u[IU]/mL (ref 0.450–4.500)

## 2021-04-20 LAB — HEMOGLOBIN A1C
Est. average glucose Bld gHb Est-mCnc: 171 mg/dL
Hgb A1c MFr Bld: 7.6 % — ABNORMAL HIGH (ref 4.8–5.6)

## 2021-04-20 LAB — FOLATE: Folate: 14.5 ng/mL (ref >3.0)

## 2021-04-20 LAB — LIPID PANEL
Chol/HDL Ratio: 2.3 ratio (ref 0.0–5.0)
Cholesterol, Total: 116 mg/dL (ref 100–199)
HDL: 50 mg/dL (ref >39)
LDL Chol Calc (NIH): 53 mg/dL (ref 0–99)
Triglycerides: 57 mg/dL (ref 0–149)
VLDL Cholesterol Cal: 13 mg/dL (ref 5–40)

## 2021-04-20 LAB — VITAMIN B12: Vitamin B-12: >2000 pg/mL — ABNORMAL HIGH (ref 232–1245)

## 2021-04-20 MED ORDER — IOHEXOL 350 MG/ML SOLN
75.0000 mL | Freq: Once | INTRAVENOUS | Status: AC | PRN
  Administered 2021-04-20: 75 mL via INTRAVENOUS

## 2021-04-20 NOTE — Telephone Encounter (Signed)
Copied from [place] [id number]. Topic: General - Other >> Apr 20, 2021  9:07 AM Yvette Rack wrote: Reason for CRM: Valma Cava, Nurse Practitioner with [facility] called to make pcp aware that she saw patient as a part of his insurance plan. Jana Half stated they will send documentation of the visit but if there are any questions or concerns to please call her at [phone]

## 2021-04-20 NOTE — Telephone Encounter (Signed)
FYI

## 2021-04-21 ENCOUNTER — Other Ambulatory Visit: Payer: Self-pay | Admitting: Family Medicine

## 2021-04-21 ENCOUNTER — Encounter: Payer: Self-pay | Admitting: Oncology

## 2021-04-21 DIAGNOSIS — E119 Type 2 diabetes mellitus without complications: Secondary | ICD-10-CM

## 2021-04-21 NOTE — Telephone Encounter (Signed)
Requested Prescriptions  Pending Prescriptions Disp Refills  . JARDIANCE 10 MG TABS tablet [Pharmacy Med Name: Jardiance 10 MG Oral Tablet] 90 tablet 1    Sig: Take 1 tablet by mouth once daily     Endocrinology:  Diabetes - SGLT2 Inhibitors Passed - 04/21/2021  5:31 AM      Passed - Cr in normal range and within 360 days    Creatinine  Date Value Ref Range Status  02/03/2013 0.69 0.60 - 1.30 mg/dL Final   Creatinine, Ser  Date Value Ref Range Status  03/28/2021 1.02 0.61 - 1.24 mg/dL Final         Passed - LDL in normal range and within 360 days    LDL Chol Calc (NIH)  Date Value Ref Range Status  04/19/2021 53 0 - 99 mg/dL Final         Passed - HBA1C is between 0 and 7.9 and within 180 days    Hgb A1c MFr Bld  Date Value Ref Range Status  04/19/2021 7.6 (H) 4.8 - 5.6 % Final    Comment:             Prediabetes: 5.7 - 6.4          Diabetes: >6.4          Glycemic control for adults with diabetes: <7.0          Passed - eGFR in normal range and within 360 days    EGFR (African American)  Date Value Ref Range Status  02/03/2013 >60  Final   GFR calc Af Amer  Date Value Ref Range Status  04/06/2020 >60 >60 mL/min Final   EGFR (Non-African Amer.)  Date Value Ref Range Status  02/03/2013 >60  Final    Comment:    eGFR values <35mL/min/1.73 m2 may be an indication of chronic kidney disease (CKD). Calculated eGFR is useful in patients with stable renal function. The eGFR calculation will not be reliable in acutely ill patients when serum creatinine is changing rapidly. It is not useful in  patients on dialysis. The eGFR calculation may not be applicable to patients at the low and high extremes of body sizes, pregnant women, and vegetarians.    GFR, Estimated  Date Value Ref Range Status  03/28/2021 >60 >60 mL/min Final    Comment:    (NOTE) Calculated using the CKD-EPI Creatinine Equation (2021)          Passed - Valid encounter within last 6 months     Recent Outpatient Visits          3 weeks ago Medicare annual wellness visit, subsequent   [facility] Jerrol Banana., MD   5 months ago Type 2 diabetes mellitus without complication, without long-term current use of insulin [facility])   [facility] Jerrol Banana., MD   1 year ago Medicare annual wellness visit, subsequent   [facility] Jerrol Banana., MD   1 year ago Type 2 diabetes mellitus without complication, without long-term current use of insulin [facility])   [facility] Jerrol Banana., MD   1 year ago Type 2 diabetes mellitus without complication, without long-term current use of insulin [facility])   [facility] Jerrol Banana., MD      Future Appointments            In 1 month Borders, Kirt Boys, NP [facility]   In  2 months [place], NP [facility]   In 4 months Jerrol Banana., MD [facility]

## 2021-04-25 ENCOUNTER — Other Ambulatory Visit: Payer: Self-pay

## 2021-04-25 ENCOUNTER — Inpatient Hospital Stay: Payer: HMO

## 2021-04-25 ENCOUNTER — Inpatient Hospital Stay (HOSPITAL_BASED_OUTPATIENT_CLINIC_OR_DEPARTMENT_OTHER): Payer: HMO | Admitting: Oncology

## 2021-04-25 ENCOUNTER — Inpatient Hospital Stay: Payer: HMO | Attending: Internal Medicine

## 2021-04-25 ENCOUNTER — Encounter: Payer: Self-pay | Admitting: Oncology

## 2021-04-25 VITALS — BP 96/56 | HR 71 | Resp 18

## 2021-04-25 VITALS — BP 103/72 | HR 84 | Temp 97.1°F | Resp 18

## 2021-04-25 DIAGNOSIS — C642 Malignant neoplasm of left kidney, except renal pelvis: Secondary | ICD-10-CM

## 2021-04-25 DIAGNOSIS — Z5111 Encounter for antineoplastic chemotherapy: Secondary | ICD-10-CM | POA: Diagnosis not present

## 2021-04-25 DIAGNOSIS — T451X5A Adverse effect of antineoplastic and immunosuppressive drugs, initial encounter: Secondary | ICD-10-CM

## 2021-04-25 DIAGNOSIS — Z5112 Encounter for antineoplastic immunotherapy: Secondary | ICD-10-CM | POA: Diagnosis not present

## 2021-04-25 DIAGNOSIS — D701 Agranulocytosis secondary to cancer chemotherapy: Secondary | ICD-10-CM

## 2021-04-25 LAB — CBC WITH DIFFERENTIAL/PLATELET
Abs Immature Granulocytes: 0.02 10*3/uL (ref 0.00–0.07)
Basophils Absolute: 0.1 10*3/uL (ref 0.0–0.1)
Basophils Relative: 1 %
Eosinophils Absolute: 0.4 10*3/uL (ref 0.0–0.5)
Eosinophils Relative: 6 %
HCT: 32.5 % — ABNORMAL LOW (ref 39.0–52.0)
Hemoglobin: 10.6 g/dL — ABNORMAL LOW (ref 13.0–17.0)
Immature Granulocytes: 0 %
Lymphocytes Relative: 13 %
Lymphs Abs: 0.9 10*3/uL (ref 0.7–4.0)
MCH: 27.7 pg (ref 26.0–34.0)
MCHC: 32.6 g/dL (ref 30.0–36.0)
MCV: 85.1 fL (ref 80.0–100.0)
Monocytes Absolute: 0.6 10*3/uL (ref 0.1–1.0)
Monocytes Relative: 9 %
Neutro Abs: 5 10*3/uL (ref 1.7–7.7)
Neutrophils Relative %: 71 %
Platelets: 261 10*3/uL (ref 150–400)
RBC: 3.82 MIL/uL — ABNORMAL LOW (ref 4.22–5.81)
RDW: 15.8 % — ABNORMAL HIGH (ref 11.5–15.5)
WBC: 7 10*3/uL (ref 4.0–10.5)
nRBC: 0 % (ref 0.0–0.2)

## 2021-04-25 LAB — COMPREHENSIVE METABOLIC PANEL
ALT: 9 U/L (ref 0–44)
AST: 14 U/L — ABNORMAL LOW (ref 15–41)
Albumin: 3.4 g/dL — ABNORMAL LOW (ref 3.5–5.0)
Alkaline Phosphatase: 106 U/L (ref 38–126)
Anion gap: 8 (ref 5–15)
BUN: 23 mg/dL (ref 8–23)
CO2: 25 mmol/L (ref 22–32)
Calcium: 9.3 mg/dL (ref 8.9–10.3)
Chloride: 99 mmol/L (ref 98–111)
Creatinine, Ser: 0.79 mg/dL (ref 0.61–1.24)
GFR, Estimated: >60 mL/min (ref >60)
Glucose, Bld: 132 mg/dL — ABNORMAL HIGH (ref 70–99)
Potassium: 4.3 mmol/L (ref 3.5–5.1)
Sodium: 132 mmol/L — ABNORMAL LOW (ref 135–145)
Total Bilirubin: 0.6 mg/dL (ref 0.3–1.2)
Total Protein: 7.5 g/dL (ref 6.5–8.1)

## 2021-04-25 MED ORDER — SODIUM CHLORIDE 0.9 % IV SOLN
1.0000 mg/kg | Freq: Once | INTRAVENOUS | Status: AC
  Administered 2021-04-25: 70 mg via INTRAVENOUS
  Filled 2021-04-25: qty 3

## 2021-04-25 MED ORDER — HEPARIN SOD (PORK) LOCK FLUSH 100 UNIT/ML IV SOLN
INTRAVENOUS | Status: AC
  Administered 2021-04-25: 500 [IU]
  Filled 2021-04-25: qty 5

## 2021-04-25 MED ORDER — HEPARIN SOD (PORK) LOCK FLUSH 100 UNIT/ML IV SOLN
500.0000 [IU] | Freq: Once | INTRAVENOUS | Status: DC | PRN
Start: 2021-04-25 — End: 2021-04-25
  Filled 2021-04-25: qty 5

## 2021-04-25 MED ORDER — SODIUM CHLORIDE 0.9 % IV SOLN
Freq: Once | INTRAVENOUS | Status: AC
  Filled 2021-04-25: qty 250

## 2021-04-25 MED ORDER — PALONOSETRON HCL INJECTION 0.25 MG/5ML
0.2500 mg | Freq: Once | INTRAVENOUS | Status: AC
  Administered 2021-04-25: 0.25 mg via INTRAVENOUS
  Filled 2021-04-25: qty 5

## 2021-04-25 NOTE — Progress Notes (Signed)
Pt has seen his CT scan results in MyChart and would like to discuss treatment options with MD.

## 2021-04-25 NOTE — Progress Notes (Signed)
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 [name] so far for his metastatic urothelial carcinoma.  This was originally diagnosed in May 2019.  He was noted to have a filling defect in the lower pole collecting system of the left kidney along with para-aortic and retroperitoneal adenopathy concerning for metastatic disease.  He underwent left ureteroscopy and renal pelvis biopsy which revealed small fragments of high-grade urothelial carcinoma with small focus of invasion.  He was started on carboplatin and gemcitabine chemotherapy in June 2019 and was continued on 05/27/2018 for 8 cycles.  He tolerated chemotherapy well except for chemo-induced anemia for which she has been getting Procrit every 2 weeks.  He did have response to his disease based on scans in August 2019.  However repeat scan on 05/31/2018 showed increase in the size of the primary tumor from 1.2 to 1.9 cm and increase in left para-aortic adenopathy from 0.9 to 1.3 cm.  Periportal adenopathy was stable at 1.4 cm.  Second line immunotherapy was recommended.  His initial biopsy specimen did not have enough sample to undergo FGFR mutation testing.  He also has mild interstitial lung disease for which he sees pulmonary  but he is not on home oxygen.  He has B12 deficiency for which he is on oral B12.  Also has diabetes and coronary artery disease.  Tecentriq started on 06/17/2018.  Patient noted to have disease progression in his lymph nodes in May 2020.  Repeat biopsy showed metastatic urothelial carcinoma which did not have a FGFR mutation.  He has been started on third line Padcev   Treatment on hold since April 2021 due to neuropathy and to maintain quality of life but restarted in August 2021 after he was found to have progression of disease which he has 1 week on 3 weeks off   CT scan in June 2022 showed increase in the size of primary urothelial mass but no other evidence of metastatic disease elsewhere.  He completed palliative radiation to the mass in July 2022    Interval history-patient reports doing well overall.  He has baseline fatigue but denies any new complaints at this time.  Neuropathy has remained stable.  Denies any abdominal pain  ECOG PS- 1 Pain scale- 0 Opioid associated constipation- no  Review of systems- Review of Systems  Constitutional:  Positive for malaise/fatigue. Negative for chills, fever and weight loss.  HENT:  Negative for congestion, ear discharge and nosebleeds.   Eyes:  Negative for blurred vision.  Respiratory:  Negative for cough, hemoptysis, sputum production, shortness of breath and wheezing.   Cardiovascular:  Negative for chest pain, palpitations, orthopnea and claudication.  Gastrointestinal:  Negative for abdominal pain, blood in stool, constipation, diarrhea, heartburn, melena, nausea and vomiting.  Genitourinary:  Negative for dysuria, flank pain, frequency, hematuria and urgency.  Musculoskeletal:  Negative for back pain, joint pain and  myalgias.  Skin:  Negative for rash.  Neurological:  Positive for sensory change (Peripheral neuropathy). Negative for dizziness, tingling, focal weakness, seizures, weakness and headaches.  Endo/Heme/Allergies:  Does not  bruise/bleed easily.  Psychiatric/Behavioral:  Negative for depression and suicidal ideas. The patient does not have insomnia.       Allergies  Allergen Reactions   Sulfa Antibiotics Other (See Comments)    Joint pain   Ace Inhibitors Cough   Invokana [Canagliflozin] Other (See Comments)    Leg pain   Penicillins Rash    Did it involve swelling of the face/tongue/throat, SOB, or low BP? No Did it involve sudden or severe rash/hives, skin peeling, or any reaction on the inside of your mouth or nose? Yes Did you need to seek medical attention at a hospital or doctor's office? Yes When did it last happen?      childhood allergy If all above answers are "NO", may proceed with cephalosporin use.      Past Medical History:  Diagnosis Date   Acid reflux    Anxiety    Depression    Diabetes mellitus without complication ([place])    Hyperlipidemia    Hypertension    MRSA (methicillin resistant Staphylococcus aureus) infection 1992   history   Myocardial infarction ([place]) 1995   Sleep apnea    CPAP   Urothelial cancer ([place]) 11/2017   Left Urothelial mass, chemo tx's     Past Surgical History:  Procedure Laterality Date   CATARACT EXTRACTION Left    COLONOSCOPY  2010   [facility]   COLONOSCOPY WITH PROPOFOL N/A 10/04/2016   Procedure: COLONOSCOPY WITH PROPOFOL;  Surgeon: [name], MD;  Location: [place];  Service: Endoscopy;  Laterality: N/A;   [place], 2000, 2001, 2014   CYSTOSCOPY W/ RETROGRADES Bilateral 12/10/2017   Procedure: CYSTOSCOPY WITH RETROGRADE PYELOGRAM;  Surgeon: [name], MD;  Location: [facility];  Service: Urology;  Laterality: Bilateral;   CYSTOSCOPY W/ RETROGRADES Left 12/09/2018   Procedure: CYSTOSCOPY WITH RETROGRADE PYELOGRAM;  Surgeon: [name], MD;  Location: [facility];  Service: Urology;  Laterality: Left;   CYSTOSCOPY WITH BIOPSY Left 12/09/2018   Procedure: CYSTOSCOPY WITH URETERAL/RENAL  PELVIC BIOPSY;  Surgeon: [name], MD;  Location: [facility];  Service: Urology;  Laterality: Left;   CYSTOSCOPY WITH STENT PLACEMENT Left 12/10/2017   Procedure: CYSTOSCOPY WITH STENT PLACEMENT;  Surgeon: [name], MD;  Location: [facility];  Service: Urology;  Laterality: Left;   CYSTOSCOPY WITH STENT PLACEMENT Left 12/09/2018   Procedure: CYSTOSCOPY WITH STENT PLACEMENT;  Surgeon: [name], MD;  Location: [facility];  Service: Urology;  Laterality: Left;   CYSTOSCOPY WITH URETEROSCOPY Left 12/09/2018   Procedure: CYSTOSCOPY WITH URETEROSCOPY;  Surgeon: [name], MD;  Location: [facility];  Service: Urology;  Laterality: Left;   EYE SURGERY Bilateral    cataract   INGUINAL HERNIA REPAIR Right 08/09/2015   Procedure: HERNIA REPAIR INGUINAL ADULT;  Surgeon: [name], MD;  Location: [facility];  Service: General;  Laterality: Right;   NASAL SINUS SURGERY     nuclear stress test     PORTA CATH INSERTION N/A 12/19/2017   Procedure: PORTA CATH INSERTION;  Surgeon: [name], MD;  Location: [place] [facility];  Service: Cardiovascular;  Laterality: N/A;   TOOTH EXTRACTION  05/28/2020   upper left and it was molars   URETERAL BIOPSY Left 12/10/2017   Procedure: URETERAL & renal PELVIS BIOPSY;  Surgeon: [name], MD;  Location: [facility];  Service: Urology;  Laterality: Left;   URETEROSCOPY Left 12/10/2017   Procedure: URETEROSCOPY;  Surgeon: [name], MD;  Location: [facility];  Service: Urology;  Laterality: Left;    Social History   Socioeconomic History   Marital status: Married    Spouse name: [name]   Number of children: 1   Years of education: Not on file   Highest education level: Associate degree: occupational, Hotel manager, or vocational program  Occupational History   Occupation: retired  Tobacco Use   Smoking status: Former    Types: Cigars    Quit date: 10/09/1978    Years since quitting: 42.5   Smokeless tobacco: Former    Types: Chew    Quit  date: 12/04/1988   Tobacco comments:    on occasion  Vaping Use   Vaping Use: Never used  Substance and Sexual Activity   Alcohol use: Yes    Comment: beer once a week   Drug use: No   Sexual activity: Not Currently  Other Topics Concern   Not on file  Social History Narrative   Not on file   Social Determinants of Health   Financial Resource Strain: Not on file  Food Insecurity: Not on file  Transportation Needs: Not on file  Physical Activity: Not on file  Stress: Not on file  Social Connections: Not on file  Intimate Partner Violence: Not on file    Family History  Problem Relation Age of Onset   Heart disease Mother    Cancer Father        Lung and colon cancer   Heart disease Father    Emphysema Maternal Grandfather    Tuberculosis Maternal Grandmother      Current Outpatient Medications:    aspirin EC 81 MG tablet, Take 81 mg by mouth every evening. , Disp: , Rfl:    atorvastatin (LIPITOR) 40 MG tablet, TAKE 1 TABLET BY MOUTH AT BEDTIME, Disp: 90 tablet, Rfl: 3   blood glucose meter kit and supplies, Dispense based on patient and insurance preference. Use once daily as directed. (FOR ICD-10 E11.9)., Disp: 1 each, Rfl: 0   Blood Glucose Monitoring Suppl (ONE TOUCH ULTRA 2) w/Device KIT, 1 each by Does not apply route once for 1 dose. Test blood sugars once daily, Disp: 1 kit, Rfl: 0   Cholecalciferol (VITAMIN D3) 25 MCG (1000 UT) CAPS, Take 1 capsule by mouth daily., Disp: , Rfl:    clobetasol cream (TEMOVATE) 6.14 %, Apply 1 application topically as needed (for skin rash)., Disp: 30 g, Rfl: 1   fluticasone (FLONASE) 50 MCG/ACT nasal spray, Use 2 spray(s) in each nostril once daily, Disp: 48 g, Rfl: 3   glucose blood (ONETOUCH ULTRA) test strip, Use as instructed, Disp: 100 each, Rfl: 12   hydrOXYzine (ATARAX/VISTARIL) 25 MG tablet, Take 1 tablet (25 mg total) by mouth every 8 (eight) hours as needed for itching., Disp: 30 tablet, Rfl: 3   Ivermectin 1 % CREA, Apply  1 application topically daily as needed (rosacea). To face, Disp: , Rfl:    JARDIANCE 10 MG TABS tablet, Take 1 tablet by mouth once daily, Disp: 90 tablet, Rfl: 1   Lancets (ONETOUCH ULTRASOFT) lancets, Use as instructed, Disp: 100 each, Rfl: 12   lidocaine-prilocaine (EMLA) cream, Apply 1 application topically as needed (port access)., Disp: 1 g, Rfl: 3   losartan (COZAAR) 50 MG tablet, Take 1 tablet (50 mg total) by mouth daily.,  Disp: 90 tablet, Rfl: 1   magnesium hydroxide (MILK OF MAGNESIA) 400 MG/5ML suspension, Take 15 mLs by mouth daily as needed for mild constipation., Disp: , Rfl:    metFORMIN (GLUCOPHAGE) 1000 MG tablet, TAKE 1 TABLET BY MOUTH TWICE DAILY WITH MEALS, Disp: 180 tablet, Rfl: 0   nystatin cream (MYCOSTATIN), Apply topically 2 (two) times daily., Disp: 15 g, Rfl: 1   OVER THE COUNTER MEDICATION, Apply 1 application topically daily as needed (pain). Outback topical pain relief, Disp: , Rfl:    polyethylene glycol (MIRALAX / GLYCOLAX) 17 g packet, Take 17 g by mouth daily as needed., Disp: , Rfl:    pregabalin (LYRICA) 75 MG capsule, Take 1 capsule by mouth once daily, Disp: 90 capsule, Rfl: 0   psyllium (METAMUCIL) 58.6 % powder, Take 1 packet by mouth daily., Disp: , Rfl:    sertraline (ZOLOFT) 50 MG tablet, Take 1 tablet (50 mg total) by mouth daily., Disp: 90 tablet, Rfl: 3   vitamin B-12 (CYANOCOBALAMIN) 1000 MCG tablet, Take by mouth daily. Unsure dose, Disp: , Rfl:    vitamin C (ASCORBIC ACID) 500 MG tablet, Take 1,000-1,500 mg by mouth 2 (two) times daily., Disp: , Rfl:  No current facility-administered medications for this visit.  Facility-Administered Medications Ordered in Other Visits:    heparin lock flush 100 unit/mL, 500 Units, Intracatheter, Once PRN, Sindy Guadeloupe, MD   Influenza vac split quadrivalent PF (FLUZONE HIGH-DOSE) injection 0.5 mL, 0.5 mL, Intramuscular, Once, Karen Kitchens, NP  Physical exam:  Vitals:   04/25/21 1330  BP: 103/72  Pulse: 84   Resp: 18  Temp: (!) 97.1 F (36.2 C)  TempSrc: Tympanic  SpO2: 95%   Physical Exam Constitutional:      General: He is not in acute distress. Cardiovascular:     Rate and Rhythm: Normal rate and regular rhythm.     Heart sounds: Normal heart sounds.  Pulmonary:     Effort: Pulmonary effort is normal.     Breath sounds: Normal breath sounds.  Skin:    General: Skin is warm and dry.  Neurological:     Mental Status: He is alert and oriented to person, place, and time.     CMP Latest Ref Rng & Units 04/25/2021  Glucose 70 - 99 mg/dL 132(H)  BUN 8 - 23 mg/dL 23  Creatinine 0.61 - 1.24 mg/dL 0.79  Sodium 135 - 145 mmol/L 132(L)  Potassium 3.5 - 5.1 mmol/L 4.3  Chloride 98 - 111 mmol/L 99  CO2 22 - 32 mmol/L 25  Calcium 8.9 - 10.3 mg/dL 9.3  Total Protein 6.5 - 8.1 g/dL 7.5  Total Bilirubin 0.3 - 1.2 mg/dL 0.6  Alkaline Phos 38 - 126 U/L 106  AST 15 - 41 U/L 14(L)  ALT 0 - 44 U/L 9   CBC Latest Ref Rng & Units 04/25/2021  WBC 4.0 - 10.5 K/uL 7.0  Hemoglobin 13.0 - 17.0 g/dL 10.6(L)  Hematocrit 39.0 - 52.0 % 32.5(L)  Platelets 150 - 400 K/uL 261    No images are attached to the encounter.  CT CHEST ABDOMEN PELVIS W CONTRAST  Result Date: 04/21/2021 CLINICAL DATA:  Urothelial cancer.  Restaging. EXAM: CT CHEST, ABDOMEN, AND PELVIS WITH CONTRAST TECHNIQUE: Multidetector CT imaging of the chest, abdomen and pelvis was performed following the standard protocol during bolus administration of intravenous contrast. CONTRAST:  25mL OMNIPAQUE IOHEXOL 350 MG/ML SOLN COMPARISON:  MRI 01/23/2021.  CT 12/20/2020. FINDINGS: CT CHEST FINDINGS Cardiovascular: The  heart size is normal. No substantial pericardial effusion. Coronary artery calcification is evident. Mild atherosclerotic calcification is noted in the wall of the thoracic aorta. Right Port-A-Cath tip is positioned in the distal SVC. Mediastinum/Nodes: No mediastinal lymphadenopathy. There is no hilar lymphadenopathy. The  esophagus has normal imaging features. There is no axillary lymphadenopathy. Lungs/Pleura: Stable bilateral subpleural reticulation. New 6 mm right lower lobe nodule on 96/3. No focal airspace consolidation. There is no evidence of pleural effusion. Musculoskeletal: No worrisome lytic or sclerotic osseous abnormality. CT ABDOMEN PELVIS FINDINGS Hepatobiliary: No suspicious focal abnormality within the liver parenchyma. Gallbladder is nondistended. Tiny calcified gallstone again noted. No intrahepatic or extrahepatic biliary dilation. Pancreas: Pancreas is diffusely fatty replaced. No main duct dilatation. Spleen: No splenomegaly. No focal mass lesion. Adrenals/Urinary Tract: Multiple cysts in the right kidney are stable. No right hydroureteronephrosis. Decreased perfusion noted left kidney with similar amorphous ill-defined soft tissue in the central sinus of the upper pole and involving the renal pelvis. This appears progressive since CT of 12/20/2020 and expansion of the left renal pelvis a also appears progressive since MRI of 01/23/2021. There is left perinephric stranding, similar to previous MR. No left hydroureter. The urinary bladder appears normal for the degree of distention. Stomach/Bowel: Luminal narrowing in the antral region is similar to prior. No gastric wall thickening. No evidence of outlet obstruction. Duodenum is normally positioned as is the ligament of Treitz. No small bowel wall thickening. No small bowel dilatation. The terminal ileum is normal. The appendix is normal. No gross colonic mass. No colonic wall thickening. Vascular/Lymphatic: There is mild atherosclerotic calcification of the abdominal aorta without aneurysm. There is no gastrohepatic or hepatoduodenal ligament lymphadenopathy. No retroperitoneal or mesenteric lymphadenopathy. No pelvic sidewall lymphadenopathy. Reproductive: Prostate gland mildly enlarged. Other: No intraperitoneal free fluid. Musculoskeletal: No worrisome lytic  or sclerotic osseous abnormality. IMPRESSION: 1. New 6 mm right lower lobe pulmonary nodule. Metastatic disease a distinct concern. 2. Decreased perfusion noted left kidney with similar amorphous ill-defined soft tissue in the central sinus of the upper and interpolar kidney. This appears progressive since CT of 12/20/2020 and expansion of the left renal pelvis also appears progressive since MRI of 01/23/2021. Imaging features compatible with patient's known tumor. 3. No evidence for metastatic disease in the abdomen or pelvis. 4. Cholelithiasis. 5. Chronic interstitial lung disease. 6. Aortic Atherosclerosis (ICD10-I70.0). Electronically Signed   By: Misty Stanley M.D.   On: 04/21/2021 21:11     Assessment and plan- Patient is a 75 y.o. 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  He is here for on treatment assessment prior to next cycle of Padcev and discuss CT scan results and further management  I have reviewed CT images independently and discussed findings with the patient.There is a nonspecific right lower lobe pulmonary nodule 6 mm noted on present scan which is new.  It is unclear if this truly represents metastatic disease versus inflammatory and it is subcentimeter at this time and would not be amenable for biopsy.  I am inclined to monitor this area on future scans.  There is also more perfusion abnormality and soft tissue abnormality noted in the upper pole and renal pelvis on the right side.  This is the area where patient has received radiation in the past.  I would like to review the scans with radiology at tumor board this week to see if this truly represents progressive disease versus radiation changes.  Patient has tolerated  Padcev very well in the past and given that there is no significant new disease elsewhere and overall burden of disease is below I am inclined to monitor this with a repeat scan in 3 months and continue Padcev but intensify  the regimen and give it week on week off instead of week on 3 weeks off which the patient was getting in the past due to his neuropathy and to maintain his quality of life.  He will proceed with Padcev today and proceed with next cycle of Padcev in 2 weeks and I will see him back in 4 weeks.  We discussed that if scans represent true progression we could still consider giving Padcev or increased frequency before considering other management options.  Other options at this time include single agent Alimta versus sacituzumab govitecan which can be associated with side effects such as pancytopenia.  Sacituzumab govitecan can also be associated with infusion reactions.  Patient and his wife comprehend my plan well   Visit Diagnosis 1. Encounter for antineoplastic chemotherapy   2. Urothelial carcinoma of kidney, left (HCC)      Dr. Randa Evens, MD, MPH [facility] [phone] 04/25/2021 4:32 PM

## 2021-04-25 NOTE — Patient Instructions (Signed)
[facility] [place] [facility]  Discharge Instructions: Thank you for choosing [facility] to provide your oncology and hematology care.  If you have a lab appointment with the Cancer Center, please go directly to the Cancer Center and check in at the registration area.  Wear comfortable clothing and clothing appropriate for easy access to any Portacath or PICC line.   We strive to give you quality time with your provider. You may need to reschedule your appointment if you arrive late (15 or more minutes).  Arriving late affects you and other patients whose appointments are after yours.  Also, if you miss three or more appointments without notifying the office, you may be dismissed from the clinic at the provider's discretion.      For prescription refill requests, have your pharmacy contact our office and allow 72 hours for refills to be completed.    Today you received the following chemotherapy and/or immunotherapy agents       To help prevent nausea and vomiting after your treatment, we encourage you to take your nausea medication as directed.  BELOW ARE SYMPTOMS THAT SHOULD BE REPORTED IMMEDIATELY: *FEVER GREATER THAN 100.4 F (38 C) OR HIGHER *CHILLS OR SWEATING *NAUSEA AND VOMITING THAT IS NOT CONTROLLED WITH YOUR NAUSEA MEDICATION *UNUSUAL SHORTNESS OF BREATH *UNUSUAL BRUISING OR BLEEDING *URINARY PROBLEMS (pain or burning when urinating, or frequent urination) *BOWEL PROBLEMS (unusual diarrhea, constipation, pain near the anus) TENDERNESS IN MOUTH AND THROAT WITH OR WITHOUT PRESENCE OF ULCERS (sore throat, sores in mouth, or a toothache) UNUSUAL RASH, SWELLING OR PAIN  UNUSUAL VAGINAL DISCHARGE OR ITCHING   Items with * indicate a potential emergency and should be followed up as soon as possible or go to the Emergency Department if any problems should occur.  Please show the CHEMOTHERAPY ALERT CARD or IMMUNOTHERAPY ALERT CARD at check-in to the  Emergency Department and triage nurse.  Should you have questions after your visit or need to cancel or reschedule your appointment, please contact [facility]  [phone] and follow the prompts.  Office hours are 8:00 a.m. to 4:30 p.m. Monday - Friday. Please note that voicemails left after 4:00 p.m. may not be returned until the following business day.  We are closed weekends and major holidays. You have access to a nurse at all times for urgent questions. Please call the main number to the clinic [phone] and follow the prompts.  For any non-urgent questions, you may also contact your provider using MyChart. We now offer e-Visits for anyone 18 and older to request care online for non-urgent symptoms. For details visit [email or web address].   Also download the MyChart app! Go to the app store, search "MyChart", open the app, select [facility], and log in with your MyChart username and password.  Due to Covid, a mask is required upon entering the hospital/clinic. If you do not have a mask, one will be given to you upon arrival. For doctor visits, patients may have 1 support person aged 18 or older with them. For treatment visits, patients cannot have anyone with them due to current Covid guidelines and our immunocompromised population.  

## 2021-04-28 ENCOUNTER — Other Ambulatory Visit: Payer: HMO

## 2021-04-28 NOTE — Progress Notes (Signed)
Tumor Board Documentation  Patrick Mcgee was presented by Dr Janese Banks at our Tumor Board on 04/28/2021, which included representatives from pulmonology, surgical, medical oncology, radiology, pathology, genetics, research, navigation, radiation oncology, internal medicine.  Patrick Mcgee currently presents as a current patient, for discussion with history of the following treatments: active survellience, adjuvant chemotherapy, adjuvant radiation, surgical intervention(s).  Additionally, we reviewed previous medical and familial history, history of present illness, and recent lab results along with all available histopathologic and imaging studies. The tumor board considered available treatment options and made the following recommendations:   MD will discuss options with patient for new Lung Nodule, possible metastatic disease  The following procedures/referrals were also placed: No orders of the defined types were placed in this encounter.   Clinical Trial Status: not discussed   Staging used: AJCC Stage Group AJCC Staging: T: X N: c1 M: c1 Group: Stage IV Urothelial Carcinoma   National site-specific guidelines NCCN were discussed with respect to the case.  Tumor board is a meeting of clinicians from various specialty areas who evaluate and discuss patients for whom a multidisciplinary approach is being considered. Final determinations in the plan of care are those of the provider(s). The responsibility for follow up of recommendations given during tumor board is that of the provider.   Today's extended care, comprehensive team conference, Patrick Mcgee was not present for the discussion and was not examined.   Multidisciplinary Tumor Board is a multidisciplinary case peer review process.  Decisions discussed in the Multidisciplinary Tumor Board reflect the opinions of the specialists present at the conference without having examined the patient.  Ultimately, treatment and diagnostic decisions rest with  the primary provider(s) and the patient.

## 2021-05-02 ENCOUNTER — Other Ambulatory Visit: Payer: HMO

## 2021-05-02 ENCOUNTER — Ambulatory Visit: Payer: HMO

## 2021-05-09 ENCOUNTER — Inpatient Hospital Stay: Payer: HMO

## 2021-05-09 ENCOUNTER — Other Ambulatory Visit: Payer: Self-pay

## 2021-05-09 VITALS — BP 130/84 | HR 72 | Temp 96.0°F | Resp 18 | Wt 156.1 lb

## 2021-05-09 DIAGNOSIS — C642 Malignant neoplasm of left kidney, except renal pelvis: Secondary | ICD-10-CM

## 2021-05-09 DIAGNOSIS — D701 Agranulocytosis secondary to cancer chemotherapy: Secondary | ICD-10-CM

## 2021-05-09 DIAGNOSIS — Z5112 Encounter for antineoplastic immunotherapy: Secondary | ICD-10-CM | POA: Diagnosis not present

## 2021-05-09 LAB — COMPREHENSIVE METABOLIC PANEL
ALT: 9 U/L (ref 0–44)
AST: 13 U/L — ABNORMAL LOW (ref 15–41)
Albumin: 3.3 g/dL — ABNORMAL LOW (ref 3.5–5.0)
Alkaline Phosphatase: 97 U/L (ref 38–126)
Anion gap: 8 (ref 5–15)
BUN: 21 mg/dL (ref 8–23)
CO2: 27 mmol/L (ref 22–32)
Calcium: 9.1 mg/dL (ref 8.9–10.3)
Chloride: 99 mmol/L (ref 98–111)
Creatinine, Ser: 0.77 mg/dL (ref 0.61–1.24)
GFR, Estimated: >60 mL/min (ref >60)
Glucose, Bld: 157 mg/dL — ABNORMAL HIGH (ref 70–99)
Potassium: 4.1 mmol/L (ref 3.5–5.1)
Sodium: 134 mmol/L — ABNORMAL LOW (ref 135–145)
Total Bilirubin: 0.7 mg/dL (ref 0.3–1.2)
Total Protein: 7.2 g/dL (ref 6.5–8.1)

## 2021-05-09 LAB — CBC WITH DIFFERENTIAL/PLATELET
Abs Immature Granulocytes: 0.04 10*3/uL (ref 0.00–0.07)
Basophils Absolute: 0.1 10*3/uL (ref 0.0–0.1)
Basophils Relative: 1 %
Eosinophils Absolute: 0.3 10*3/uL (ref 0.0–0.5)
Eosinophils Relative: 4 %
HCT: 31.9 % — ABNORMAL LOW (ref 39.0–52.0)
Hemoglobin: 10.5 g/dL — ABNORMAL LOW (ref 13.0–17.0)
Immature Granulocytes: 1 %
Lymphocytes Relative: 13 %
Lymphs Abs: 1 10*3/uL (ref 0.7–4.0)
MCH: 27.8 pg (ref 26.0–34.0)
MCHC: 32.9 g/dL (ref 30.0–36.0)
MCV: 84.4 fL (ref 80.0–100.0)
Monocytes Absolute: 0.8 10*3/uL (ref 0.1–1.0)
Monocytes Relative: 10 %
Neutro Abs: 5.7 10*3/uL (ref 1.7–7.7)
Neutrophils Relative %: 71 %
Platelets: 285 10*3/uL (ref 150–400)
RBC: 3.78 MIL/uL — ABNORMAL LOW (ref 4.22–5.81)
RDW: 15.4 % (ref 11.5–15.5)
WBC: 7.9 10*3/uL (ref 4.0–10.5)
nRBC: 0 % (ref 0.0–0.2)

## 2021-05-09 MED ORDER — PALONOSETRON HCL INJECTION 0.25 MG/5ML
0.2500 mg | Freq: Once | INTRAVENOUS | Status: AC
  Administered 2021-05-09: 0.25 mg via INTRAVENOUS
  Filled 2021-05-09: qty 5

## 2021-05-09 MED ORDER — SODIUM CHLORIDE 0.9 % IV SOLN
Freq: Once | INTRAVENOUS | Status: AC
  Filled 2021-05-09: qty 250

## 2021-05-09 MED ORDER — SODIUM CHLORIDE 0.9 % IV SOLN
1.0000 mg/kg | Freq: Once | INTRAVENOUS | Status: AC
  Administered 2021-05-09: 70 mg via INTRAVENOUS
  Filled 2021-05-09: qty 4

## 2021-05-09 MED ORDER — HEPARIN SOD (PORK) LOCK FLUSH 100 UNIT/ML IV SOLN
INTRAVENOUS | Status: AC
  Administered 2021-05-09: 500 [IU]
  Filled 2021-05-09: qty 5

## 2021-05-09 NOTE — Patient Instructions (Signed)
[facility] [place] [facility]   Discharge Instructions: Thank you for choosing [facility] to provide your oncology and hematology care.  If you have a lab appointment with the Cancer Center, please go directly to the Cancer Center and check in at the registration area.  Wear comfortable clothing and clothing appropriate for easy access to any Portacath or PICC line.   We strive to give you quality time with your provider. You may need to reschedule your appointment if you arrive late (15 or more minutes).  Arriving late affects you and other patients whose appointments are after yours.  Also, if you miss three or more appointments without notifying the office, you may be dismissed from the clinic at the provider's discretion.      For prescription refill requests, have your pharmacy contact our office and allow 72 hours for refills to be completed.    Today you received the following chemotherapy and/or immunotherapy agents: Padcev.      To help prevent nausea and vomiting after your treatment, we encourage you to take your nausea medication as directed.  BELOW ARE SYMPTOMS THAT SHOULD BE REPORTED IMMEDIATELY: *FEVER GREATER THAN 100.4 F (38 C) OR HIGHER *CHILLS OR SWEATING *NAUSEA AND VOMITING THAT IS NOT CONTROLLED WITH YOUR NAUSEA MEDICATION *UNUSUAL SHORTNESS OF BREATH *UNUSUAL BRUISING OR BLEEDING *URINARY PROBLEMS (pain or burning when urinating, or frequent urination) *BOWEL PROBLEMS (unusual diarrhea, constipation, pain near the anus) TENDERNESS IN MOUTH AND THROAT WITH OR WITHOUT PRESENCE OF ULCERS (sore throat, sores in mouth, or a toothache) UNUSUAL RASH, SWELLING OR PAIN  UNUSUAL VAGINAL DISCHARGE OR ITCHING   Items with * indicate a potential emergency and should be followed up as soon as possible or go to the Emergency Department if any problems should occur.  Please show the CHEMOTHERAPY ALERT CARD or IMMUNOTHERAPY ALERT CARD at check-in  to the Emergency Department and triage nurse.  Should you have questions after your visit or need to cancel or reschedule your appointment, please contact [facility] [place] [facility]  [phone] and follow the prompts.  Office hours are 8:00 a.m. to 4:30 p.m. Monday - Friday. Please note that voicemails left after 4:00 p.m. may not be returned until the following business day.  We are closed weekends and major holidays. You have access to a nurse at all times for urgent questions. Please call the main number to the clinic [phone] and follow the prompts.  For any non-urgent questions, you may also contact your provider using MyChart. We now offer e-Visits for anyone 18 and older to request care online for non-urgent symptoms. For details visit [email or web address].   Also download the MyChart app! Go to the app store, search "MyChart", open the app, select [facility], and log in with your MyChart username and password.  Due to Covid, a mask is required upon entering the hospital/clinic. If you do not have a mask, one will be given to you upon arrival. For doctor visits, patients may have 1 support person aged 18 or older with them. For treatment visits, patients cannot have anyone with them due to current Covid guidelines and our immunocompromised population.  

## 2021-05-17 ENCOUNTER — Other Ambulatory Visit: Payer: Self-pay

## 2021-05-17 ENCOUNTER — Encounter: Payer: Self-pay | Admitting: Physical Therapy

## 2021-05-17 ENCOUNTER — Ambulatory Visit: Payer: HMO | Attending: Oncology | Admitting: Physical Therapy

## 2021-05-17 DIAGNOSIS — R2681 Unsteadiness on feet: Secondary | ICD-10-CM | POA: Insufficient documentation

## 2021-05-17 DIAGNOSIS — R278 Other lack of coordination: Secondary | ICD-10-CM | POA: Diagnosis present

## 2021-05-17 DIAGNOSIS — R262 Difficulty in walking, not elsewhere classified: Secondary | ICD-10-CM | POA: Insufficient documentation

## 2021-05-17 DIAGNOSIS — M6281 Muscle weakness (generalized): Secondary | ICD-10-CM | POA: Insufficient documentation

## 2021-05-17 DIAGNOSIS — R269 Unspecified abnormalities of gait and mobility: Secondary | ICD-10-CM | POA: Insufficient documentation

## 2021-05-17 DIAGNOSIS — R2689 Other abnormalities of gait and mobility: Secondary | ICD-10-CM | POA: Diagnosis present

## 2021-05-17 NOTE — Therapy (Signed)
[place] MAIN [facility] SERVICES [address] [place], [place], [address] Phone: [phone]   Fax:  [phone]  Physical Therapy Evaluation  Patient Details  Name: Patrick Mcgee MRN: [id number] Date of Birth: 77-Jan-1947 Referring Provider (PT): Randa Evens MD   Encounter Date: 05/17/2021   PT End of Session - 05/17/21 1519     Visit Number 1    Number of Visits 24    Date for PT Re-Evaluation 08/09/21    Authorization Time Period 05/17/21-08/10/75    Progress Note Due on Visit 10    PT Start Time 1452    PT Stop Time 6962    PT Time Calculation (min) 50 min             Past Medical History:  Diagnosis Date   Acid reflux    Anxiety    Depression    Diabetes mellitus without complication ([place])    Hyperlipidemia    Hypertension    MRSA (methicillin resistant Staphylococcus aureus) infection 1992   history   Myocardial infarction ([place]) 1995   Sleep apnea    CPAP   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   Left Urothelial mass, chemo tx's    Past Surgical History:  Procedure Laterality Date   CATARACT EXTRACTION Left    COLONOSCOPY  2010   [facility]   COLONOSCOPY WITH PROPOFOL N/A 10/04/2016   Procedure: COLONOSCOPY WITH PROPOFOL;  Surgeon: [name], MD;  Location: [place];  Service: Endoscopy;  Laterality: N/A;   [place], 2000, 2001, 2014   CYSTOSCOPY W/ RETROGRADES Bilateral 12/10/2017   Procedure: CYSTOSCOPY WITH RETROGRADE PYELOGRAM;  Surgeon: [name], MD;  Location: [facility];  Service: Urology;  Laterality: Bilateral;   CYSTOSCOPY W/ RETROGRADES Left 12/09/2018   Procedure: CYSTOSCOPY WITH RETROGRADE PYELOGRAM;  Surgeon: [name], MD;  Location: [facility];  Service: Urology;  Laterality: Left;   CYSTOSCOPY WITH BIOPSY Left 12/09/2018   Procedure: CYSTOSCOPY WITH URETERAL/RENAL PELVIC BIOPSY;  Surgeon: [name], MD;  Location: [facility];  Service: Urology;   Laterality: Left;   CYSTOSCOPY WITH STENT PLACEMENT Left 12/10/2017   Procedure: CYSTOSCOPY WITH STENT PLACEMENT;  Surgeon: [name], MD;  Location: [facility];  Service: Urology;  Laterality: Left;   CYSTOSCOPY WITH STENT PLACEMENT Left 12/09/2018   Procedure: CYSTOSCOPY WITH STENT PLACEMENT;  Surgeon: [name], MD;  Location: [facility];  Service: Urology;  Laterality: Left;   CYSTOSCOPY WITH URETEROSCOPY Left 12/09/2018   Procedure: CYSTOSCOPY WITH URETEROSCOPY;  Surgeon: [name], MD;  Location: [facility];  Service: Urology;  Laterality: Left;   EYE SURGERY Bilateral    cataract   INGUINAL HERNIA REPAIR Right 08/09/2015   Procedure: HERNIA REPAIR INGUINAL ADULT;  Surgeon: [name], MD;  Location: [facility];  Service: General;  Laterality: Right;   NASAL SINUS SURGERY     nuclear stress test     PORTA CATH INSERTION N/A 12/19/2017   Procedure: PORTA CATH INSERTION;  Surgeon: [name], MD;  Location: [facility];  Service: Cardiovascular;  Laterality: N/A;   TOOTH EXTRACTION  05/28/2020   upper left and it was molars   URETERAL BIOPSY Left 12/10/2017   Procedure: URETERAL & renal PELVIS BIOPSY;  Surgeon: [name], MD;  Location: [facility];  Service: Urology;  Laterality: Left;   URETEROSCOPY Left 12/10/2017   Procedure: URETEROSCOPY;  Surgeon: [name], MD;  Location: [facility];  Service: Urology;  Laterality: Left;    There were no vitals filed for this visit.    Subjective Assessment - 05/17/21 1456     Subjective Pt reports he has come to therapy in the past in order to improve his strength, He reports PT was very helpful and he got a lot stronger but he had trouble keeping up with the HEP. Pt reports he was diagnosed with kidney cancer about 4 years ago. He reports 3-4 different treatments and his current treatment has been very helpful. Pt reports MD stated some difficulty interpretting results of CT due to radiation. Pt and MD had agreed to  start chemo 2x/month to assist with growth and spreading of tumor. Pt reports he has not been able to find someone to exercise with since d/c from PRT previously and states. Pt states he worked at [employer] until he was 75 y.o. he states prior to cancer dx he was overweight and he started nutrisystem and he lost a lot of weight. Pt states he needs to be challenged with therapy sessions. Pt reports with his current cancer condition he does not know how long he has due to his current cancer diagnosis.  Patient would like to improve his strength so he can improve his independence with household and community related activities.    Pertinent History Stage 4 urothelial cancer. Was seen in our clinic here previously. Has hx of DM T2 and difficulty wiht apetite.Pt reports he has come to therapy in the past in order to improve his strength, He reports PT was very helpful and he got a lot stronger but he had trouble keeping up with the HEP. Pt reports he was diagnosed with kidney cancer about 4 years ago. He reports 3-4 different treatments and his current treatment has been very helpful. Pt reports MD stated some difficulty interpretting results of CT due to radiation. Pt and MD had agreed to start chemo 2x/month to assist with growth and spreading of tumor. Pt reports he has not been able to find someone to exercise with since d/c from PRT previously and states. Pt states he worked at [employer] until he was 75 y.o. he states prior to cancer dx he was overweight and he started nutrisystem and he lost a lot of weight. Pt states he needs to be challenged with therapy sessions. Pt reports with his current cancer condition he does not know how long he has due to his current cancer diagnosis.  Patient would like to improve his strength so he can improve his independence with household and community related activities.    Limitations Walking;House hold activities;Other (comment)   bathing, requires break period following   How  long can you sit comfortably? n/a    How long can you stand comfortably? 1 hour or longer depending on the day    How long can you walk comfortably? Depends on the day with nutrition and feeling    Patient Stated Goals Improve upper and LE strength as well as improve balance    Currently in Pain? No/denies                [facility] PT Assessment - 05/17/21 0001       Assessment   Medical Diagnosis Weakness    Referring Provider (PT) Randa Evens MD    Hand Dominance Right    Prior Therapy Yes, in this clinic, similar complaints      Precautions   Precautions Fall      Restrictions   Weight Bearing  Restrictions No      Balance Screen   Has the patient fallen in the past 6 months Yes    How many times? 8    Has the patient had a decrease in activity level because of a fear of falling?  No    Is the patient reluctant to leave their home because of a fear of falling?  No      Home Ecologist residence    Living Arrangements Spouse/significant other    Type of [place] Two level;Bed/bath upstairs    Alternate Level Stairs-Number of Steps 16      Prior Function   Level of Independence Independent    Vocation Retired      Associate Professor   Overall Cognitive Status Within Functional Limits for tasks assessed    Memory Appears intact    Awareness Appears intact      Observation/Other Assessments   Focus on Therapeutic Outcomes (FOTO)  36.2      Sensation   Light Touch Impaired by gross assessment      ROM / Strength   AROM / PROM / Strength Strength;AROM      AROM   Overall AROM  Within functional limits for tasks performed      Strength   Overall Strength Deficits    Overall Strength Comments 4/5 gross LE strength (hips and knees)      Transfers   Five time sit to stand comments  20, significant UE use      Balance   Balance Assessed Yes      Standardized Balance Assessment   Standardized Balance Assessment Berg  Balance Test;Timed Up and Go Test;Mini-BESTest;10 meter walk test;Five Times Sit to Stand    Five times sit to stand comments  requird chair arms for proper perofrmance    10 Meter Walk .59m/s      Berg Balance Test   Sit to Stand Able to stand  independently using hands    Standing Unsupported Able to stand safely 2 minutes    Sitting with Back Unsupported but Feet Supported on Floor or Stool Able to sit safely and securely 2 minutes    Stand to Sit Sits safely with minimal use of hands    Transfers Able to transfer safely, minor use of hands    Standing Unsupported with Eyes Closed Able to stand 10 seconds with supervision    Standing Unsupported with Feet Together Able to place feet together independently and stand for 1 minute with supervision    From Standing, Reach Forward with Outstretched Arm Can reach forward >12 cm safely (5")    From Standing Position, Pick up Object from Floor Able to pick up shoe safely and easily    From Standing Position, Turn to Look Behind Over each Shoulder Looks behind one side only/other side shows less weight shift    Turn 360 Degrees Able to turn 360 degrees safely but slowly    Standing Unsupported, Alternately Place Feet on Step/Stool Able to complete 4 steps without aid or supervision    Standing Unsupported, One Foot in Front Able to plae foot ahead of the other independently and hold 30 seconds    Standing on One Leg Tries to lift leg/unable to hold 3 seconds but remains standing independently    Total Score 43      Timed Up and Go Test   Normal TUG (seconds) 15.34  Objective measurements completed on examination: See above findings.                PT Education - 05/17/21 1738     Education Details POC    Person(s) Educated Patient    Methods Explanation    Comprehension Verbalized understanding              PT Short Term Goals - 05/17/21 1740       PT SHORT TERM GOAL #1   Title  Pt will be independent with HEP in order to improve strength and balance in order to decrease fall risk and improve function at home    Baseline Patient no longer has current home exercise program    Time 4    Period Weeks    Status New    Target Date 06/14/21               PT Long Term Goals - 05/17/21 1741       PT LONG TERM GOAL #1   Title Pt will improve BERG by at least 3 points in order to demonstrate clinically significant improvement in balance.    Baseline 43 at IE    Time 12    Period Weeks    Status New    Target Date 08/09/21      PT LONG TERM GOAL #2   Title Patient will improve 5 times sit to stand by 5 seconds or more with minimal use of upper extremity indicating improved lower extremity strength and power.    Baseline 20 seconds of significant upper extremity use initial evaluation    Time 12    Period Weeks    Status New      PT LONG TERM GOAL #3   Title Patient will improve timed up and go by 30 seconds or more year to decrease his age-adjusted risk of falls.    Baseline Timed up and go and 15.34 seconds initial evaluation.    Time 12    Period Weeks    Status Achieved    Target Date 08/09/21      PT LONG TERM GOAL #4   Title Patient will improve 10 m walk speed to greater than 1 m/s in order to indicate improved speed for community based ambulation.    Baseline Less than 0.9 m/s initial evaluation.    Time 12    Period Weeks    Target Date 08/09/21                    Plan - 05/17/21 1520     Clinical Impression Statement Patient is a 75 year old male who reports to physical therapy for improvements in balance and overall function.  Patient has history of stage IV cancer and has been undergoing treatment 2 times a month which she reports is because significant weakness.  Patient has presented to our clinic in the past for similar signs and symptoms and reports good results.  Patient presents with deficits in lower extremity strength  and function as evidenced by 5 times sit to stand, patient also presents with balance deficits indicating increased risk of falls as evidenced by Merrilee Jansky balance score admission to decreased gait speed with 10 m walk test as well as increased time to complete timed up and go test.  Patient will benefit from skilled physical therapy intervention in order to improve his lower extremity strength, balance, and overall function.    Personal Factors and Comorbidities Age;Comorbidity 1;Comorbidity 2  Comorbidities Stage 4 urothelial cancer, DM,    Examination-Activity Limitations Lift;Locomotion Level;Reach Overhead;Stairs;Stand;Transfers    Stability/Clinical Decision Making Evolving/Moderate complexity    Clinical Decision Making Moderate    Rehab Potential Good    PT Frequency 2x / week    PT Duration 12 weeks    PT Treatment/Interventions ADLs/Self Care Home Management;Aquatic Therapy;DME Instruction;Neuromuscular re-education;Balance training;Therapeutic exercise;Therapeutic activities;Functional mobility training;Stair training;Gait training;Patient/family education;Manual techniques;Passive range of motion;Energy conservation;Visual/perceptual remediation/compensation;Vestibular    PT Next Visit Plan Establish formal HEP, begin    PT Home Exercise Plan To establish neck session    Recommended Other Services None at this time    Consulted and Agree with Plan of Care Patient             Patient will benefit from skilled therapeutic intervention in order to improve the following deficits and impairments:  Abnormal gait, Decreased activity tolerance, Decreased balance, Decreased mobility, Decreased endurance, Decreased strength, Difficulty walking, Hypomobility, Impaired sensation, Impaired flexibility, Impaired tone, Impaired UE functional use, Impaired vision/preception  Visit Diagnosis: Unsteadiness on feet  Muscle weakness (generalized)  Difficulty in walking, not elsewhere  classified     Problem List Patient Active Problem List   Diagnosis Date Noted   Chest pain 03/04/2019   Anemia due to antineoplastic chemotherapy 03/03/2018   Cough 03/03/2018   Chemotherapy induced neutropenia ([place]) 01/22/2018   B12 deficiency 01/01/2018   Hypomagnesemia 12/21/2017   Encounter for antineoplastic chemotherapy 12/21/2017   Malignant neoplasm of kidney ([place])    Urothelial cancer ([place]) 12/18/2017   Malnutrition of moderate degree 12/18/2017   Therapeutic opioid induced constipation    Palliative care encounter    Dehydration 12/17/2017   Goals of care, counseling/discussion 12/13/2017   Urothelial carcinoma of kidney, left ([place]) 12/07/2017   Right inguinal hernia 07/17/2015   Family history of colon cancer 07/17/2015   Allergic rhinitis 11/12/2014   Airway hyperreactivity 11/12/2014   Atherosclerosis of coronary artery 11/12/2014   Cheilitis 11/12/2014   Narrowing of intervertebral disc space 11/12/2014   Deflected nasal septum 11/12/2014   HLD (hyperlipidemia) 11/12/2014   BP (high blood pressure) 11/12/2014   Adult hypothyroidism 11/12/2014   Sleep apnea 11/12/2014   Diabetes mellitus, type 2 ([place]) 11/12/2014   Degenerative disc disease, lumbar 09/24/2012   Furunculosis 04/23/2012    Particia Lather, PT 05/17/2021, 5:44 PM  [place] MAIN [facility] SERVICES [address] [place], [place], [address] Phone: [phone]   Fax:  [phone]  Name: ZEV BLUE MRN: [id number] Date of Birth: 09-Jan-1946

## 2021-05-19 ENCOUNTER — Encounter: Payer: Self-pay | Admitting: Physical Therapy

## 2021-05-19 ENCOUNTER — Other Ambulatory Visit: Payer: Self-pay

## 2021-05-19 ENCOUNTER — Ambulatory Visit: Payer: HMO | Admitting: Physical Therapy

## 2021-05-19 DIAGNOSIS — R269 Unspecified abnormalities of gait and mobility: Secondary | ICD-10-CM

## 2021-05-19 DIAGNOSIS — M6281 Muscle weakness (generalized): Secondary | ICD-10-CM

## 2021-05-19 DIAGNOSIS — R2689 Other abnormalities of gait and mobility: Secondary | ICD-10-CM

## 2021-05-19 DIAGNOSIS — R278 Other lack of coordination: Secondary | ICD-10-CM

## 2021-05-19 DIAGNOSIS — R2681 Unsteadiness on feet: Secondary | ICD-10-CM

## 2021-05-19 DIAGNOSIS — R262 Difficulty in walking, not elsewhere classified: Secondary | ICD-10-CM

## 2021-05-19 NOTE — Therapy (Signed)
[place] MAIN [facility] SERVICES [address] [place], [place], [address] Phone: [phone]   Fax:  [phone]  Physical Therapy Treatment  Patient Details  Name: Patrick Mcgee MRN: [id number] Date of Birth: 15-Jan-1946 Referring Provider (PT): Randa Evens MD   Encounter Date: 05/19/2021   PT End of Session - 05/19/21 1555     Visit Number 2    Number of Visits 24    Date for PT Re-Evaluation 08/09/21    Authorization Time Period 05/17/21-08/09/20    Progress Note Due on Visit 10    PT Start Time 1446    PT Stop Time 1530    PT Time Calculation (min) 44 min    Equipment Utilized During Treatment Gait belt    Activity Tolerance Patient tolerated treatment well    Behavior During Therapy [facility] for tasks assessed/performed             Past Medical History:  Diagnosis Date   Acid reflux    Anxiety    Depression    Diabetes mellitus without complication ([place])    Hyperlipidemia    Hypertension    MRSA (methicillin resistant Staphylococcus aureus) infection 1992   history   Myocardial infarction ([place]) 1995   Sleep apnea    CPAP   Urothelial cancer ([place]) 11/2017   Left Urothelial mass, chemo tx's    Past Surgical History:  Procedure Laterality Date   CATARACT EXTRACTION Left    COLONOSCOPY  2010   [facility]   COLONOSCOPY WITH PROPOFOL N/A 10/04/2016   Procedure: COLONOSCOPY WITH PROPOFOL;  Surgeon: [name], MD;  Location: [place];  Service: Endoscopy;  Laterality: N/A;   [place], 2000, 2001, 2014   CYSTOSCOPY W/ RETROGRADES Bilateral 12/10/2017   Procedure: CYSTOSCOPY WITH RETROGRADE PYELOGRAM;  Surgeon: [name], MD;  Location: [facility];  Service: Urology;  Laterality: Bilateral;   CYSTOSCOPY W/ RETROGRADES Left 12/09/2018   Procedure: CYSTOSCOPY WITH RETROGRADE PYELOGRAM;  Surgeon: [name], MD;  Location: [facility];  Service: Urology;  Laterality: Left;    CYSTOSCOPY WITH BIOPSY Left 12/09/2018   Procedure: CYSTOSCOPY WITH URETERAL/RENAL PELVIC BIOPSY;  Surgeon: [name], MD;  Location: [facility];  Service: Urology;  Laterality: Left;   CYSTOSCOPY WITH STENT PLACEMENT Left 12/10/2017   Procedure: CYSTOSCOPY WITH STENT PLACEMENT;  Surgeon: [name], MD;  Location: [facility];  Service: Urology;  Laterality: Left;   CYSTOSCOPY WITH STENT PLACEMENT Left 12/09/2018   Procedure: CYSTOSCOPY WITH STENT PLACEMENT;  Surgeon: [name], MD;  Location: [facility];  Service: Urology;  Laterality: Left;   CYSTOSCOPY WITH URETEROSCOPY Left 12/09/2018   Procedure: CYSTOSCOPY WITH URETEROSCOPY;  Surgeon: [name], MD;  Location: [facility];  Service: Urology;  Laterality: Left;   EYE SURGERY Bilateral    cataract   INGUINAL HERNIA REPAIR Right 08/09/2015   Procedure: HERNIA REPAIR INGUINAL ADULT;  Surgeon: [name], MD;  Location: [facility];  Service: General;  Laterality: Right;   NASAL SINUS SURGERY     nuclear stress test     PORTA CATH INSERTION N/A 12/19/2017   Procedure: PORTA CATH INSERTION;  Surgeon: [name], MD;  Location: [place] [facility];  Service: Cardiovascular;  Laterality: N/A;   TOOTH EXTRACTION  05/28/2020   upper left and it was molars   URETERAL BIOPSY Left 12/10/2017   Procedure: URETERAL & renal PELVIS BIOPSY;  Surgeon: [name], MD;  Location: [facility];  Service: Urology;  Laterality: Left;   URETEROSCOPY Left 12/10/2017   Procedure: URETEROSCOPY;  Surgeon: [name], MD;  Location: [facility];  Service: Urology;  Laterality: Left;    There were no vitals filed for this visit.   Subjective Assessment - 05/19/21 1450     Subjective Pt states he is doing well today. Reports no changes since last session (evaulation). States he has his next cancer treatment next Monday.    Pertinent History Stage 4 urothelial cancer. Was seen in our clinic here previously. Has hx of DM T2 and difficulty wiht apetite.Pt  reports he has come to therapy in the past in order to improve his strength, He reports PT was very helpful and he got a lot stronger but he had trouble keeping up with the HEP. Pt reports he was diagnosed with kidney cancer about 4 years ago. He reports 3-4 different treatments and his current treatment has been very helpful. Pt reports MD stated some difficulty interpretting results of CT due to radiation. Pt and MD had agreed to start chemo 2x/month to assist with growth and spreading of tumor. Pt reports he has not been able to find someone to exercise with since d/c from PRT previously and states. Pt states he worked at [employer] until he was 75 y.o. he states prior to cancer dx he was overweight and he started nutrisystem and he lost a lot of weight. Pt states he needs to be challenged with therapy sessions. Pt reports with his current cancer condition he does not know how long he has due to his current cancer diagnosis.  Patient would like to improve his strength so he can improve his independence with household and community related activities.    Limitations Walking;House hold activities;Other (comment)    How long can you sit comfortably? n/a    How long can you stand comfortably? 1 hour or longer depending on the day    How long can you walk comfortably? Depends on the day with nutrition and feeling    Patient Stated Goals Improve upper and LE strength as well as improve balance    Currently in Pain? No/denies              Treatment session conducted at support bar. BUE support with therex.  Occasional UE support with neuromuscular re-ed. (Frequent with tandem stance.)   Therapeutic Exercise (MedBridge HEP)  Access Code: [email or web address] URL: [email or web address] Date: 05/19/2021 Prepared by: Patrina Levering  Exercises Mini Squat with Counter Support - 1 x daily - 7 x weekly - 2 sets - 10 reps - 4# weight Standing March with Counter Support - 1 x daily - 7 x weekly - 2 sets -  10 reps - 4# weight Standing Hip Abduction with Counter Support - 1 x daily - 7 x weekly - 2 sets - 10 reps - 4# weight Standing Hip Extension with Counter Support - 1 x daily - 7 x weekly - 2 sets - 10 reps - 4# weight Standing Alternating Knee Flexion with Ankle Weights - 1 x daily - 7 x weekly - 2 sets - 10 reps - 4# weight Heel Raises with Counter Support - 1 x daily - 7 x weekly - 2 sets - 10 reps - 4# weight   Neuromuscular Re-ed  Standing on airex pad: -romberg, EO; 1 x 30 seconds  -romberg, EC; 2 x 30 seconds -romberg, EO, vertical head turns; x1 minute -romberg, EC, vertical head turns; x1 minute -romberg, EO, horizontal head  turns; x1 minute  Semi-tandem stance, firm surface; 2 x 1 minute each side Tandem stance, firm surface; 1 x 1 minute each side. Very challenging. Increased difficulty with L foot forward.    Clinical Impression: Pt demonstrates excellent motivation throughout session today. HEP was created and provided with pt performing full routine within today's session. HEP focusing on strength - balance exercises to be added in the future. PT began pt with 2# AW with pt reporting "somewhat easy" - PT then increased weight to 4# with pt stating an appropriate challenge level. Pt then progressed to balance exercises with moderately good balance until performance of full tandem stance where pt had difficulty with lifting hands from support bar. Pt was educated on frequency to perform HEP and to monitor symptoms/fatigue in days surrounding chemo treatments - PT advised to call front desk if pt needs to cancel and pt will not be penalized. Patient will benefit from skilled physical therapy intervention in order to improve his lower extremity strength, balance, and overall function.          PT Short Term Goals - 05/17/21 1740       PT SHORT TERM GOAL #1   Title Pt will be independent with HEP in order to improve strength and balance in order to decrease fall risk and  improve function at home    Baseline Patient no longer has current home exercise program    Time 4    Period Weeks    Status New    Target Date 06/14/21               PT Long Term Goals - 05/17/21 1741       PT LONG TERM GOAL #1   Title Pt will improve BERG by at least 3 points in order to demonstrate clinically significant improvement in balance.    Baseline 43 at IE    Time 12    Period Weeks    Status New    Target Date 08/09/21      PT LONG TERM GOAL #2   Title Patient will improve 5 times sit to stand by 5 seconds or more with minimal use of upper extremity indicating improved lower extremity strength and power.    Baseline 20 seconds of significant upper extremity use initial evaluation    Time 12    Period Weeks    Status New      PT LONG TERM GOAL #3   Title Patient will improve timed up and go by 30 seconds or more year to decrease his age-adjusted risk of falls.    Baseline Timed up and go and 15.34 seconds initial evaluation.    Time 12    Period Weeks    Status Achieved    Target Date 08/09/21      PT LONG TERM GOAL #4   Title Patient will improve 10 m walk speed to greater than 1 m/s in order to indicate improved speed for community based ambulation.    Baseline Less than 0.9 m/s initial evaluation.    Time 12    Period Weeks    Target Date 08/09/21                   Plan - 05/19/21 1555     Clinical Impression Statement Pt demonstrates excellent motivation throughout session today. HEP was created and provided with pt performing full routine within today's session. HEP focusing on strength - balance exercises to be added in  the future. PT began pt with 2# AW with pt reporting "somewhat easy" - PT then increased weight to 4# with pt stating an appropriate challenge level. Pt then progressed to balance exercises with moderately good balance until performance of full tandem stance where pt had difficulty with lifting hands from support bar. Pt  was educated on frequency to perform HEP and to monitor symptoms/fatigue in days surrounding chemo treatments - PT advised to call front desk if pt needs to cancel and pt will not be penalized. Patient will benefit from skilled physical therapy intervention in order to improve his lower extremity strength, balance, and overall function.    Personal Factors and Comorbidities Age;Comorbidity 1;Comorbidity 2    Comorbidities Stage 4 urothelial cancer, DM,    Examination-Activity Limitations Lift;Locomotion Level;Reach Overhead;Stairs;Stand;Transfers    Stability/Clinical Decision Making Evolving/Moderate complexity    Rehab Potential Good    PT Frequency 2x / week    PT Duration 12 weeks    PT Treatment/Interventions ADLs/Self Care Home Management;Aquatic Therapy;DME Instruction;Neuromuscular re-education;Balance training;Therapeutic exercise;Therapeutic activities;Functional mobility training;Stair training;Gait training;Patient/family education;Manual techniques;Passive range of motion;Energy conservation;Visual/perceptual remediation/compensation;Vestibular    PT Next Visit Plan Establish formal HEP, begin    PT Home Exercise Plan To establish neck session    Consulted and Agree with Plan of Care Patient             Patient will benefit from skilled therapeutic intervention in order to improve the following deficits and impairments:  Abnormal gait, Decreased activity tolerance, Decreased balance, Decreased mobility, Decreased endurance, Decreased strength, Difficulty walking, Hypomobility, Impaired sensation, Impaired flexibility, Impaired tone, Impaired UE functional use, Impaired vision/preception  Visit Diagnosis: Abnormality of gait and mobility  Other lack of coordination  Difficulty in walking, not elsewhere classified  Unsteadiness on feet  Muscle weakness (generalized)  Other abnormalities of gait and mobility     Problem List Patient Active Problem List   Diagnosis  Date Noted   Chest pain 03/04/2019   Anemia due to antineoplastic chemotherapy 03/03/2018   Cough 03/03/2018   Chemotherapy induced neutropenia ([place]) 01/22/2018   B12 deficiency 01/01/2018   Hypomagnesemia 12/21/2017   Encounter for antineoplastic chemotherapy 12/21/2017   Malignant neoplasm of kidney ([place])    Urothelial cancer ([place]) 12/18/2017   Malnutrition of moderate degree 12/18/2017   Therapeutic opioid induced constipation    Palliative care encounter    Dehydration 12/17/2017   Goals of care, counseling/discussion 12/13/2017   Urothelial carcinoma of kidney, left ([place]) 12/07/2017   Right inguinal hernia 07/17/2015   Family history of colon cancer 07/17/2015   Allergic rhinitis 11/12/2014   Airway hyperreactivity 11/12/2014   Atherosclerosis of coronary artery 11/12/2014   Cheilitis 11/12/2014   Narrowing of intervertebral disc space 11/12/2014   Deflected nasal septum 11/12/2014   HLD (hyperlipidemia) 11/12/2014   BP (high blood pressure) 11/12/2014   Adult hypothyroidism 11/12/2014   Sleep apnea 11/12/2014   Diabetes mellitus, type 2 ([place]) 11/12/2014   Degenerative disc disease, lumbar 09/24/2012   Furunculosis 04/23/2012    Patrina Levering PT, DPT  [place] [facility] MAIN [facility] SERVICES [address] [place], [place], [address] Phone: [phone]   Fax:  [phone]  Name: Patrick Mcgee MRN: [id number] Date of Birth: [birth year]

## 2021-05-23 ENCOUNTER — Encounter: Payer: Self-pay | Admitting: Oncology

## 2021-05-23 ENCOUNTER — Inpatient Hospital Stay: Payer: HMO

## 2021-05-23 ENCOUNTER — Inpatient Hospital Stay: Payer: HMO | Attending: Oncology | Admitting: Oncology

## 2021-05-23 ENCOUNTER — Other Ambulatory Visit: Payer: Self-pay

## 2021-05-23 VITALS — BP 100/78 | HR 81 | Temp 96.6°F | Resp 16 | Wt 150.6 lb

## 2021-05-23 DIAGNOSIS — D701 Agranulocytosis secondary to cancer chemotherapy: Secondary | ICD-10-CM

## 2021-05-23 DIAGNOSIS — Z5112 Encounter for antineoplastic immunotherapy: Secondary | ICD-10-CM | POA: Diagnosis not present

## 2021-05-23 DIAGNOSIS — Z5111 Encounter for antineoplastic chemotherapy: Secondary | ICD-10-CM

## 2021-05-23 DIAGNOSIS — C642 Malignant neoplasm of left kidney, except renal pelvis: Secondary | ICD-10-CM

## 2021-05-23 DIAGNOSIS — C779 Secondary and unspecified malignant neoplasm of lymph node, unspecified: Secondary | ICD-10-CM | POA: Diagnosis not present

## 2021-05-23 LAB — CBC WITH DIFFERENTIAL/PLATELET
Abs Immature Granulocytes: 0.02 10*3/uL (ref 0.00–0.07)
Basophils Absolute: 0.1 10*3/uL (ref 0.0–0.1)
Basophils Relative: 1 %
Eosinophils Absolute: 0.4 10*3/uL (ref 0.0–0.5)
Eosinophils Relative: 5 %
HCT: 34.5 % — ABNORMAL LOW (ref 39.0–52.0)
Hemoglobin: 11.1 g/dL — ABNORMAL LOW (ref 13.0–17.0)
Immature Granulocytes: 0 %
Lymphocytes Relative: 16 %
Lymphs Abs: 1.2 10*3/uL (ref 0.7–4.0)
MCH: 27.3 pg (ref 26.0–34.0)
MCHC: 32.2 g/dL (ref 30.0–36.0)
MCV: 84.8 fL (ref 80.0–100.0)
Monocytes Absolute: 0.6 10*3/uL (ref 0.1–1.0)
Monocytes Relative: 8 %
Neutro Abs: 5.4 10*3/uL (ref 1.7–7.7)
Neutrophils Relative %: 70 %
Platelets: 299 10*3/uL (ref 150–400)
RBC: 4.07 MIL/uL — ABNORMAL LOW (ref 4.22–5.81)
RDW: 15 % (ref 11.5–15.5)
WBC: 7.6 10*3/uL (ref 4.0–10.5)
nRBC: 0 % (ref 0.0–0.2)

## 2021-05-23 LAB — COMPREHENSIVE METABOLIC PANEL
ALT: 10 U/L (ref 0–44)
AST: 14 U/L — ABNORMAL LOW (ref 15–41)
Albumin: 3.5 g/dL (ref 3.5–5.0)
Alkaline Phosphatase: 114 U/L (ref 38–126)
Anion gap: 10 (ref 5–15)
BUN: 18 mg/dL (ref 8–23)
CO2: 27 mmol/L (ref 22–32)
Calcium: 9.7 mg/dL (ref 8.9–10.3)
Chloride: 98 mmol/L (ref 98–111)
Creatinine, Ser: 0.87 mg/dL (ref 0.61–1.24)
GFR, Estimated: >60 mL/min (ref >60)
Glucose, Bld: 165 mg/dL — ABNORMAL HIGH (ref 70–99)
Potassium: 4.2 mmol/L (ref 3.5–5.1)
Sodium: 135 mmol/L (ref 135–145)
Total Bilirubin: 0.9 mg/dL (ref 0.3–1.2)
Total Protein: 8.2 g/dL — ABNORMAL HIGH (ref 6.5–8.1)

## 2021-05-23 MED ORDER — HEPARIN SOD (PORK) LOCK FLUSH 100 UNIT/ML IV SOLN
INTRAVENOUS | Status: AC
  Filled 2021-05-23: qty 5

## 2021-05-23 MED ORDER — SODIUM CHLORIDE 0.9 % IV SOLN
Freq: Once | INTRAVENOUS | Status: AC
  Filled 2021-05-23: qty 250

## 2021-05-23 MED ORDER — SODIUM CHLORIDE 0.9 % IV SOLN
1.0000 mg/kg | Freq: Once | INTRAVENOUS | Status: AC
  Administered 2021-05-23: 70 mg via INTRAVENOUS
  Filled 2021-05-23: qty 3

## 2021-05-23 MED ORDER — HEPARIN SOD (PORK) LOCK FLUSH 100 UNIT/ML IV SOLN
500.0000 [IU] | Freq: Once | INTRAVENOUS | Status: AC | PRN
  Administered 2021-05-23: 500 [IU]
  Filled 2021-05-23: qty 5

## 2021-05-23 MED ORDER — PALONOSETRON HCL INJECTION 0.25 MG/5ML
0.2500 mg | Freq: Once | INTRAVENOUS | Status: AC
  Administered 2021-05-23: 0.25 mg via INTRAVENOUS
  Filled 2021-05-23: qty 5

## 2021-05-23 NOTE — Patient Instructions (Signed)
[place] ONCOLOGY  Discharge Instructions: Thank you for choosing [facility] to provide your oncology and hematology care.  If you have a lab appointment with the [facility], please go directly to the [facility] and check in at the registration area.  Wear comfortable clothing and clothing appropriate for easy access to any Portacath or PICC line.   We strive to give you quality time with your provider. You may need to reschedule your appointment if you arrive late (15 or more minutes).  Arriving late affects you and other patients whose appointments are after yours.  Also, if you miss three or more appointments without notifying the office, you may be dismissed from the clinic at the provider's discretion.      For prescription refill requests, have your pharmacy contact our office and allow 72 hours for refills to be completed.    Today you received the following chemotherapy and/or immunotherapy agents padcev   To help prevent nausea and vomiting after your treatment, we encourage you to take your nausea medication as directed.  BELOW ARE SYMPTOMS THAT SHOULD BE REPORTED IMMEDIATELY: *FEVER GREATER THAN 100.4 F (38 C) OR HIGHER *CHILLS OR SWEATING *NAUSEA AND VOMITING THAT IS NOT CONTROLLED WITH YOUR NAUSEA MEDICATION *UNUSUAL SHORTNESS OF BREATH *UNUSUAL BRUISING OR BLEEDING *URINARY PROBLEMS (pain or burning when urinating, or frequent urination) *BOWEL PROBLEMS (unusual diarrhea, constipation, pain near the anus) TENDERNESS IN MOUTH AND THROAT WITH OR WITHOUT PRESENCE OF ULCERS (sore throat, sores in mouth, or a toothache) UNUSUAL RASH, SWELLING OR PAIN  UNUSUAL VAGINAL DISCHARGE OR ITCHING   Items with * indicate a potential emergency and should be followed up as soon as possible or go to the Emergency Department if any problems should occur.  Please show the CHEMOTHERAPY ALERT CARD or IMMUNOTHERAPY ALERT CARD at check-in to the  Emergency Department and triage nurse.  Should you have questions after your visit or need to cancel or reschedule your appointment, please contact [facility]  [phone] and follow the prompts.  Office hours are 8:00 a.m. to 4:30 p.m. Monday - Friday. Please note that voicemails left after 4:00 p.m. may not be returned until the following business day.  We are closed weekends and major holidays. You have access to a nurse at all times for urgent questions. Please call the main number to the clinic [phone] and follow the prompts.  For any non-urgent questions, you may also contact your provider using MyChart. We now offer e-Visits for anyone 26 and older to request care online for non-urgent symptoms. For details visit [email or web address].   Also download the MyChart app! Go to the app store, search "MyChart", open the app, select [facility], and log in with your MyChart username and password.  Due to Covid, a mask is required upon entering the hospital/clinic. If you do not have a mask, one will be given to you upon arrival. For doctor visits, patients may have 1 support person aged 74 or older with them. For treatment visits, patients cannot have anyone with them due to current Covid guidelines and our immunocompromised population.

## 2021-05-23 NOTE — Progress Notes (Signed)
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 [name] so far for his metastatic urothelial carcinoma.  This was originally diagnosed in May 2019.  He was noted to have a filling defect in the lower pole collecting system of the left kidney along with para-aortic and retroperitoneal adenopathy concerning for metastatic disease.  He underwent left ureteroscopy and renal pelvis biopsy which revealed small fragments of high-grade urothelial carcinoma with small focus of invasion.  He was started on carboplatin and gemcitabine chemotherapy in June 2019 and was continued on 05/27/2018 for 8 cycles.  He tolerated chemotherapy well except for chemo-induced anemia for which she has been getting Procrit every 2 weeks.  He did have response to his disease based on scans in August 2019.  However repeat scan on 05/31/2018 showed increase in the size of the primary tumor from 1.2 to 1.9 cm and increase in left para-aortic adenopathy from 0.9 to 1.3 cm.  Periportal adenopathy was stable at 1.4 cm.  Second line immunotherapy was recommended.  His initial biopsy specimen did not have enough sample to undergo FGFR mutation testing.  He also has mild interstitial lung disease for which he  sees pulmonary but he is not on home oxygen.  He has B12 deficiency for which he is on oral B12.  Also has diabetes and coronary artery disease.  Tecentriq started on 06/17/2018.  Patient noted to have disease progression in his lymph nodes in May 2020.  Repeat biopsy showed metastatic urothelial carcinoma which did not have a FGFR mutation.  He has been started on third line Padcev   Treatment on hold since April 2021 due to neuropathy and to maintain quality of life but restarted in August 2021 after he was found to have progression of disease which he has 1 week on 3 weeks off   CT scan in June 2022 showed increase in the size of primary urothelial mass but no other evidence of metastatic disease elsewhere.  He completed palliative radiation to the mass in July 2022  Interval history-patient is here with his wife today.  He has baseline chronic fatigue.  Sleeps most of the day.  Appetite has always been poor.  Weight is down to 150 pounds from prior value of 153 pounds.  Neuropathy in his feet is stable  ECOG PS- 2 Pain scale- 0   Review of systems- Review of Systems  Constitutional:  Positive for malaise/fatigue. Negative for chills, fever and weight loss.  HENT:  Negative for congestion, ear discharge and nosebleeds.   Eyes:  Negative for blurred vision.  Respiratory:  Negative for cough, hemoptysis, sputum production, shortness of breath and wheezing.   Cardiovascular:  Negative for chest pain, palpitations, orthopnea and claudication.  Gastrointestinal:  Negative for abdominal pain, blood in stool, constipation, diarrhea, heartburn, melena, nausea and vomiting.  Genitourinary:  Negative for  dysuria, flank pain, frequency, hematuria and urgency.  Musculoskeletal:  Negative for back pain, joint pain and myalgias.  Skin:  Negative for rash.  Neurological:  Negative for dizziness, tingling, focal weakness, seizures, weakness and headaches.  Endo/Heme/Allergies:  Does not bruise/bleed easily.   Psychiatric/Behavioral:  Negative for depression and suicidal ideas. The patient does not have insomnia.      Allergies  Allergen Reactions   Sulfa Antibiotics Other (See Comments)    Joint pain   Ace Inhibitors Cough   Invokana [Canagliflozin] Other (See Comments)    Leg pain   Penicillins Rash    Did it involve swelling of the face/tongue/throat, SOB, or low BP? No Did it involve sudden or severe rash/hives, skin peeling, or any reaction on the inside of your mouth or nose? Yes Did you need to seek medical attention at a hospital or doctor's office? Yes When did it last happen?      childhood allergy If all above answers are "NO", may proceed with cephalosporin use.      Past Medical History:  Diagnosis Date   Acid reflux    Anxiety    Depression    Diabetes mellitus without complication ([place])    Hyperlipidemia    Hypertension    MRSA (methicillin resistant Staphylococcus aureus) infection 1992   history   Myocardial infarction ([place]) 1995   Sleep apnea    CPAP   Urothelial cancer ([place]) 11/2017   Left Urothelial mass, chemo tx's     Past Surgical History:  Procedure Laterality Date   CATARACT EXTRACTION Left    COLONOSCOPY  2010   [facility]   COLONOSCOPY WITH PROPOFOL N/A 10/04/2016   Procedure: COLONOSCOPY WITH PROPOFOL;  Surgeon: [name], MD;  Location: [facility];  Service: Endoscopy;  Laterality: N/A;   [place], 2000, 2001, 2014   CYSTOSCOPY W/ RETROGRADES Bilateral 12/10/2017   Procedure: CYSTOSCOPY WITH RETROGRADE PYELOGRAM;  Surgeon: [name], MD;  Location: [facility];  Service: Urology;  Laterality: Bilateral;   CYSTOSCOPY W/ RETROGRADES Left 12/09/2018   Procedure: CYSTOSCOPY WITH RETROGRADE PYELOGRAM;  Surgeon: [name], MD;  Location: [facility];  Service: Urology;  Laterality: Left;   CYSTOSCOPY WITH BIOPSY Left 12/09/2018   Procedure: CYSTOSCOPY WITH URETERAL/RENAL PELVIC BIOPSY;  Surgeon:  [name], MD;  Location: [facility];  Service: Urology;  Laterality: Left;   CYSTOSCOPY WITH STENT PLACEMENT Left 12/10/2017   Procedure: CYSTOSCOPY WITH STENT PLACEMENT;  Surgeon: [name], MD;  Location: [facility];  Service: Urology;  Laterality: Left;   CYSTOSCOPY WITH STENT PLACEMENT Left 12/09/2018   Procedure: CYSTOSCOPY WITH STENT PLACEMENT;  Surgeon: [name], MD;  Location: [facility];  Service: Urology;  Laterality: Left;   CYSTOSCOPY WITH URETEROSCOPY Left 12/09/2018   Procedure: CYSTOSCOPY WITH URETEROSCOPY;  Surgeon: [name], MD;  Location: [facility];  Service: Urology;  Laterality: Left;   EYE SURGERY Bilateral    cataract   INGUINAL HERNIA REPAIR Right 08/09/2015   Procedure: HERNIA REPAIR INGUINAL ADULT;  Surgeon: [name], MD;  Location: [facility];  Service: General;  Laterality: Right;   NASAL SINUS SURGERY     nuclear stress test     PORTA CATH INSERTION N/A 12/19/2017   Procedure: PORTA CATH INSERTION;  Surgeon: [name], MD;  Location: [facility];  Service: Cardiovascular;  Laterality: N/A;   TOOTH EXTRACTION  05/28/2020   upper left and it was molars   URETERAL BIOPSY  Left 12/10/2017   Procedure: URETERAL & renal PELVIS BIOPSY;  Surgeon: [name], MD;  Location: [facility];  Service: Urology;  Laterality: Left;   URETEROSCOPY Left 12/10/2017   Procedure: URETEROSCOPY;  Surgeon: [name], MD;  Location: [facility];  Service: Urology;  Laterality: Left;    Social History   Socioeconomic History   Marital status: Married    Spouse name: [name]   Number of children: 1   Years of education: Not on file   Highest education level: Associate degree: occupational, Hotel manager, or vocational program  Occupational History   Occupation: retired  Tobacco Use   Smoking status: Former    Types: Cigars    Quit date: 10/09/1978    Years since quitting: 42.6   Smokeless tobacco: Former    Types: Chew    Quit date: 12/04/1988   Tobacco  comments:    on occasion  Vaping Use   Vaping Use: Never used  Substance and Sexual Activity   Alcohol use: Yes    Comment: beer once a week   Drug use: No   Sexual activity: Not Currently  Other Topics Concern   Not on file  Social History Narrative   Not on file   Social Determinants of Health   Financial Resource Strain: Not on file  Food Insecurity: Not on file  Transportation Needs: Not on file  Physical Activity: Not on file  Stress: Not on file  Social Connections: Not on file  Intimate Partner Violence: Not on file    Family History  Problem Relation Age of Onset   Heart disease Mother    Cancer Father        Lung and colon cancer   Heart disease Father    Emphysema Maternal Grandfather    Tuberculosis Maternal Grandmother      Current Outpatient Medications:    aspirin EC 81 MG tablet, Take 81 mg by mouth every evening. , Disp: , Rfl:    atorvastatin (LIPITOR) 40 MG tablet, TAKE 1 TABLET BY MOUTH AT BEDTIME, Disp: 90 tablet, Rfl: 3   blood glucose meter kit and supplies, Dispense based on patient and insurance preference. Use once daily as directed. (FOR ICD-10 E11.9)., Disp: 1 each, Rfl: 0   Blood Glucose Monitoring Suppl (ONE TOUCH ULTRA 2) w/Device KIT, 1 each by Does not apply route once for 1 dose. Test blood sugars once daily, Disp: 1 kit, Rfl: 0   Cholecalciferol (VITAMIN D3) 25 MCG (1000 UT) CAPS, Take 1 capsule by mouth daily., Disp: , Rfl:    clobetasol cream (TEMOVATE) 3.71 %, Apply 1 application topically as needed (for skin rash)., Disp: 30 g, Rfl: 1   fluticasone (FLONASE) 50 MCG/ACT nasal spray, Use 2 spray(s) in each nostril once daily, Disp: 48 g, Rfl: 3   glucose blood (ONETOUCH ULTRA) test strip, Use as instructed, Disp: 100 each, Rfl: 12   JARDIANCE 10 MG TABS tablet, Take 1 tablet by mouth once daily, Disp: 90 tablet, Rfl: 1   Lancets (ONETOUCH ULTRASOFT) lancets, Use as instructed, Disp: 100 each, Rfl: 12   lidocaine-prilocaine (EMLA)  cream, Apply 1 application topically as needed (port access)., Disp: 1 g, Rfl: 3   losartan (COZAAR) 50 MG tablet, Take 1 tablet (50 mg total) by mouth daily., Disp: 90 tablet, Rfl: 1   metFORMIN (GLUCOPHAGE) 1000 MG tablet, TAKE 1 TABLET BY MOUTH TWICE DAILY WITH MEALS, Disp: 180 tablet, Rfl: 0   OVER THE COUNTER MEDICATION, Apply 1 application topically daily  as needed (pain). Outback topical pain relief, Disp: , Rfl:    polyethylene glycol (MIRALAX / GLYCOLAX) 17 g packet, Take 17 g by mouth daily as needed., Disp: , Rfl:    pregabalin (LYRICA) 75 MG capsule, Take 1 capsule by mouth once daily, Disp: 90 capsule, Rfl: 0   sertraline (ZOLOFT) 50 MG tablet, Take 1 tablet (50 mg total) by mouth daily., Disp: 90 tablet, Rfl: 3   vitamin B-12 (CYANOCOBALAMIN) 1000 MCG tablet, Take by mouth daily. Unsure dose, Disp: , Rfl:    vitamin C (ASCORBIC ACID) 500 MG tablet, Take 1,000-1,500 mg by mouth 2 (two) times daily., Disp: , Rfl:    hydrOXYzine (ATARAX/VISTARIL) 25 MG tablet, Take 1 tablet (25 mg total) by mouth every 8 (eight) hours as needed for itching. (Patient not taking: Reported on 05/23/2021), Disp: 30 tablet, Rfl: 3   Ivermectin 1 % CREA, Apply 1 application topically daily as needed (rosacea). To face (Patient not taking: Reported on 05/23/2021), Disp: , Rfl:    magnesium hydroxide (MILK OF MAGNESIA) 400 MG/5ML suspension, Take 15 mLs by mouth daily as needed for mild constipation. (Patient not taking: Reported on 05/23/2021), Disp: , Rfl:    nystatin cream (MYCOSTATIN), Apply topically 2 (two) times daily. (Patient not taking: Reported on 05/23/2021), Disp: 15 g, Rfl: 1   psyllium (METAMUCIL) 58.6 % powder, Take 1 packet by mouth daily. (Patient not taking: Reported on 05/23/2021), Disp: , Rfl:  No current facility-administered medications for this visit.  Facility-Administered Medications Ordered in Other Visits:    heparin lock flush 100 UNIT/ML injection, , , ,    Influenza vac split  quadrivalent PF (FLUZONE HIGH-DOSE) injection 0.5 mL, 0.5 mL, Intramuscular, Once, Karen Kitchens, NP  Physical exam:  Vitals:   05/23/21 0905  BP: 100/78  Pulse: 81  Resp: 16  Temp: (!) 96.6 F (35.9 C)  SpO2: 94%  Weight: 150 lb 9.6 oz (68.3 kg)   Physical Exam Constitutional:      General: He is not in acute distress. Cardiovascular:     Rate and Rhythm: Normal rate and regular rhythm.     Heart sounds: Normal heart sounds.  Pulmonary:     Effort: Pulmonary effort is normal.  Skin:    General: Skin is warm and dry.  Neurological:     Mental Status: He is alert and oriented to person, place, and time.     CMP Latest Ref Rng & Units 05/23/2021  Glucose 70 - 99 mg/dL 165(H)  BUN 8 - 23 mg/dL 18  Creatinine 0.61 - 1.24 mg/dL 0.87  Sodium 135 - 145 mmol/L 135  Potassium 3.5 - 5.1 mmol/L 4.2  Chloride 98 - 111 mmol/L 98  CO2 22 - 32 mmol/L 27  Calcium 8.9 - 10.3 mg/dL 9.7  Total Protein 6.5 - 8.1 g/dL 8.2(H)  Total Bilirubin 0.3 - 1.2 mg/dL 0.9  Alkaline Phos 38 - 126 U/L 114  AST 15 - 41 U/L 14(L)  ALT 0 - 44 U/L 10   CBC Latest Ref Rng & Units 05/23/2021  WBC 4.0 - 10.5 K/uL 7.6  Hemoglobin 13.0 - 17.0 g/dL 11.1(L)  Hematocrit 39.0 - 52.0 % 34.5(L)  Platelets 150 - 400 K/uL 299     Assessment and plan- Patient is a 75 y.o. 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  He is here for on treatment assessment prior to cycle 18-day 15 of Padcev  I discussed tumor board discussion  findings with patient and his wife.There is concern for definite progression in the renal pelvis which cannot be attributed to radiation changes.  Also with a 6 mm right lower lobe lung nodule does have concerning appearance of metastatic disease.  We discussed the patient has had progression of disease on carboplatin gemcitabine regimen followed by Gildardo Pounds and is presently older Padcev.  He was previously receiving it 1 week on 3 weeks off instead  of 3 weeks on 1 week of due to neuropathy and to try and maintain his quality of life.  Presently we have increased the frequency to 1 week on 1 week off if tolerated.  He will proceed with cycle 18-day 15 of Padcev today and directly proceed for cycle 19-day 1 of treatment in 2 weeks.  He will be seen by covering NP in 4 weeks for cycle 19-day 15 of treatment  Repeat CT scan sometime in second or third week of December and I will see him 6 weeks from now.  If he has progression on Padcev fourth line palliative chemo is a consideration if patient is willing to take it.  His performance status is 2 at best and he may choose to proceed with hospice if he has progression with present regimen.   Visit Diagnosis 1. Urothelial carcinoma of kidney, left ([place])   2. Encounter for antineoplastic chemotherapy      Dr. Randa Evens, MD, MPH [facility] [phone] 05/23/2021 11:34 AM

## 2021-05-23 NOTE — Progress Notes (Signed)
Pt will like to talk about the CT after discussing with Dr. Donella Stade per last visit.

## 2021-05-24 ENCOUNTER — Ambulatory Visit: Payer: HMO | Admitting: Physical Therapy

## 2021-05-26 ENCOUNTER — Ambulatory Visit: Payer: HMO | Admitting: Physical Therapy

## 2021-05-26 ENCOUNTER — Other Ambulatory Visit: Payer: Self-pay

## 2021-05-26 DIAGNOSIS — R2689 Other abnormalities of gait and mobility: Secondary | ICD-10-CM

## 2021-05-26 DIAGNOSIS — R269 Unspecified abnormalities of gait and mobility: Secondary | ICD-10-CM

## 2021-05-26 DIAGNOSIS — R262 Difficulty in walking, not elsewhere classified: Secondary | ICD-10-CM

## 2021-05-26 DIAGNOSIS — R2681 Unsteadiness on feet: Secondary | ICD-10-CM | POA: Diagnosis not present

## 2021-05-26 NOTE — Therapy (Signed)
[place] MAIN [facility] SERVICES [address] [place], [place], [address] Phone: [phone]   Fax:  [phone]  Physical Therapy Treatment  Patient Details  Name: Patrick Mcgee MRN: [id number] Date of Birth: 1946/05/02 Referring Provider (PT): Randa Evens MD   Encounter Date: 05/26/2021   PT End of Session - 05/26/21 1518     Visit Number 3    Number of Visits 24    Date for PT Re-Evaluation 08/09/21    Authorization Time Period 05/17/21-08/09/20    Progress Note Due on Visit 10    PT Start Time 1445    PT Stop Time 1525    PT Time Calculation (min) 40 min    Equipment Utilized During Treatment Gait belt    Activity Tolerance Patient tolerated treatment well    Behavior During Therapy [facility] for tasks assessed/performed             Past Medical History:  Diagnosis Date   Acid reflux    Anxiety    Depression    Diabetes mellitus without complication ([place])    Hyperlipidemia    Hypertension    MRSA (methicillin resistant Staphylococcus aureus) infection 1992   history   Myocardial infarction ([place]) 1995   Sleep apnea    CPAP   Urothelial cancer ([place]) 11/2017   Left Urothelial mass, chemo tx's    Past Surgical History:  Procedure Laterality Date   CATARACT EXTRACTION Left    COLONOSCOPY  2010   [facility]   COLONOSCOPY WITH PROPOFOL N/A 10/04/2016   Procedure: COLONOSCOPY WITH PROPOFOL;  Surgeon: [name], MD;  Location: [place];  Service: Endoscopy;  Laterality: N/A;   [place], 2000, 2001, 2014   CYSTOSCOPY W/ RETROGRADES Bilateral 12/10/2017   Procedure: CYSTOSCOPY WITH RETROGRADE PYELOGRAM;  Surgeon: [name], MD;  Location: [facility];  Service: Urology;  Laterality: Bilateral;   CYSTOSCOPY W/ RETROGRADES Left 12/09/2018   Procedure: CYSTOSCOPY WITH RETROGRADE PYELOGRAM;  Surgeon: [name], MD;  Location: [facility];  Service: Urology;  Laterality: Left;    CYSTOSCOPY WITH BIOPSY Left 12/09/2018   Procedure: CYSTOSCOPY WITH URETERAL/RENAL PELVIC BIOPSY;  Surgeon: [name], MD;  Location: [facility];  Service: Urology;  Laterality: Left;   CYSTOSCOPY WITH STENT PLACEMENT Left 12/10/2017   Procedure: CYSTOSCOPY WITH STENT PLACEMENT;  Surgeon: [name], MD;  Location: [facility];  Service: Urology;  Laterality: Left;   CYSTOSCOPY WITH STENT PLACEMENT Left 12/09/2018   Procedure: CYSTOSCOPY WITH STENT PLACEMENT;  Surgeon: [name], MD;  Location: [facility];  Service: Urology;  Laterality: Left;   CYSTOSCOPY WITH URETEROSCOPY Left 12/09/2018   Procedure: CYSTOSCOPY WITH URETEROSCOPY;  Surgeon: [name], MD;  Location: [facility];  Service: Urology;  Laterality: Left;   EYE SURGERY Bilateral    cataract   INGUINAL HERNIA REPAIR Right 08/09/2015   Procedure: HERNIA REPAIR INGUINAL ADULT;  Surgeon: [name], MD;  Location: [facility];  Service: General;  Laterality: Right;   NASAL SINUS SURGERY     nuclear stress test     PORTA CATH INSERTION N/A 12/19/2017   Procedure: PORTA CATH INSERTION;  Surgeon: [name], MD;  Location: [place] [facility];  Service: Cardiovascular;  Laterality: N/A;   TOOTH EXTRACTION  05/28/2020   upper left and it was molars   URETERAL BIOPSY Left 12/10/2017   Procedure: URETERAL & renal PELVIS BIOPSY;  Surgeon: [name], MD;  Location: [facility];  Service: Urology;  Laterality: Left;   URETEROSCOPY Left 12/10/2017   Procedure: URETEROSCOPY;  Surgeon: [name], MD;  Location: [facility];  Service: Urology;  Laterality: Left;    There were no vitals filed for this visit.   Subjective Assessment - 05/26/21 1613     Subjective Pt states he is doing well today. Reports some fatigue since his most recent radiation treatment. Reports he has not been able to copmlete exercises at home yet but he will try to be better about them in the near future.    Pertinent History Stage 4 urothelial cancer. Was  seen in our clinic here previously. Has hx of DM T2 and difficulty wiht apetite.Pt reports he has come to therapy in the past in order to improve his strength, He reports PT was very helpful and he got a lot stronger but he had trouble keeping up with the HEP. Pt reports he was diagnosed with kidney cancer about 4 years ago. He reports 3-4 different treatments and his current treatment has been very helpful. Pt reports MD stated some difficulty interpretting results of CT due to radiation. Pt and MD had agreed to start chemo 2x/month to assist with growth and spreading of tumor. Pt reports he has not been able to find someone to exercise with since d/c from PRT previously and states. Pt states he worked at [employer] until he was 75 y.o. he states prior to cancer dx he was overweight and he started nutrisystem and he lost a lot of weight. Pt states he needs to be challenged with therapy sessions. Pt reports with his current cancer condition he does not know how long he has due to his current cancer diagnosis.  Patient would like to improve his strength so he can improve his independence with household and community related activities.    Limitations Walking;House hold activities;Other (comment)    How long can you sit comfortably? n/a    How long can you stand comfortably? 1 hour or longer depending on the day    How long can you walk comfortably? Depends on the day with nutrition and feeling    Patient Stated Goals Improve upper and LE strength as well as improve balance                     Treatment provided this session  Therex:   Exercises Mini Squat with Counter Support -- 2 sets - 10 reps -Standing March with Counter Support - 1 x daily - 7 x weekly - 2 sets - 10 reps - 2.5#  weight Standing Hip Abduction with Counter Support - 1 x daily - - 2 sets - 10 reps - 2.5# weight Standing Hip Extension with Counter Support - 1 x daily -  - 2 sets - 10 reps - 2.5# weight Standing Alternating  Knee Flexion with Ankle Weights - - 2 sets - 10 reps - 2.5## weight Heel Raises with Counter Support - 1 x daily - 7 x weekly - 2 sets - 10 reps - 2.5# weight     Neuromuscular Re-ed  Standing on airex pad: -NBOS, EO; 1 x 30 seconds  -NBOS, EC; 2 x 30 seconds -NBOS, EO, vertical head turns; x1 minute -NBOS, EC, vertical head turns; x1 minute -NBOS, EO, horizontal head turns; x1 minute   Semi-tandem stance, firm surface; 2 x 1 minute each side Tandem stance, firm surface; 1 x 1 minute each side. Very challenging. Increased difficulty with L foot forward.  one foot on step other on firm surface 2 x 45 sec ea foot  -increased instability with R LE posterior (on floor)   Pt required occasional rest breaks due fatigue, PT was quick to ask when pt appeared to be fatiguing in order to prevent excessive fatigue.   Pt educated throughout session about proper posture and technique with exercises. Improved exercise technique, movement at target joints, use of target muscles after min to mod verbal, visual, tactile cues.             PT Education - 05/26/21 1614     Education Details Importance of adherance to HEP              PT Short Term Goals - 05/17/21 1740       PT SHORT TERM GOAL #1   Title Pt will be independent with HEP in order to improve strength and balance in order to decrease fall risk and improve function at home    Baseline Patient no longer has current home exercise program    Time 4    Period Weeks    Status New    Target Date 06/14/21               PT Long Term Goals - 05/17/21 1741       PT LONG TERM GOAL #1   Title Pt will improve BERG by at least 3 points in order to demonstrate clinically significant improvement in balance.    Baseline 43 at IE    Time 12    Period Weeks    Status New    Target Date 08/09/21      PT LONG TERM GOAL #2   Title Patient will improve 5 times sit to stand by 5 seconds or more with minimal use of upper  extremity indicating improved lower extremity strength and power.    Baseline 20 seconds of significant upper extremity use initial evaluation    Time 12    Period Weeks    Status New      PT LONG TERM GOAL #3   Title Patient will improve timed up and go by 30 seconds or more year to decrease his age-adjusted risk of falls.    Baseline Timed up and go and 15.34 seconds initial evaluation.    Time 12    Period Weeks    Status Achieved    Target Date 08/09/21      PT LONG TERM GOAL #4   Title Patient will improve 10 m walk speed to greater than 1 m/s in order to indicate improved speed for community based ambulation.    Baseline Less than 0.9 m/s initial evaluation.    Time 12    Period Weeks    Target Date 08/09/21                   Plan - 05/26/21 1518     Clinical Impression Statement Pt presents with some fatigue following cancer treatments earlier this week.  Reports he is unable to complete his home exercise program due to fatigue but does report continued motivation to begin them in the near future.  Patient was reinforced for some exercise program today demonstrated fair tolerance for completion.  Patient was also progressed with some balance activities without difficulty with the right lower extremity as primary means of balance.  Patient has significant instability with eyes closed tasks as well as with tandem balance tasks at his efficacy did improve with  increased practice.  Patient will benefit from continued skilled physical therapy intervention in order to improve his lower extremity strength, balance, prevent falls at home and improve his overall safety and quality of life    Personal Factors and Comorbidities Age;Comorbidity 1;Comorbidity 2    Comorbidities Stage 4 urothelial cancer, DM,    Examination-Activity Limitations Lift;Locomotion Level;Reach Overhead;Stairs;Stand;Transfers    Stability/Clinical Decision Making Evolving/Moderate complexity    Rehab  Potential Good    PT Frequency 2x / week    PT Duration 12 weeks    PT Treatment/Interventions ADLs/Self Care Home Management;Aquatic Therapy;DME Instruction;Neuromuscular re-education;Balance training;Therapeutic exercise;Therapeutic activities;Functional mobility training;Stair training;Gait training;Patient/family education;Manual techniques;Passive range of motion;Energy conservation;Visual/perceptual remediation/compensation;Vestibular    PT Next Visit Plan Establish formal HEP, begin    PT Home Exercise Plan To establish neck session    Consulted and Agree with Plan of Care Patient             Patient will benefit from skilled therapeutic intervention in order to improve the following deficits and impairments:  Abnormal gait, Decreased activity tolerance, Decreased balance, Decreased mobility, Decreased endurance, Decreased strength, Difficulty walking, Hypomobility, Impaired sensation, Impaired flexibility, Impaired tone, Impaired UE functional use, Impaired vision/preception  Visit Diagnosis: Abnormality of gait and mobility  Difficulty in walking, not elsewhere classified  Unsteadiness on feet  Other abnormalities of gait and mobility     Problem List Patient Active Problem List   Diagnosis Date Noted   Chest pain 03/04/2019   Anemia due to antineoplastic chemotherapy 03/03/2018   Cough 03/03/2018   Chemotherapy induced neutropenia ([place]) 01/22/2018   B12 deficiency 01/01/2018   Hypomagnesemia 12/21/2017   Encounter for antineoplastic chemotherapy 12/21/2017   Malignant neoplasm of kidney ([place])    Urothelial cancer ([place]) 12/18/2017   Malnutrition of moderate degree 12/18/2017   Therapeutic opioid induced constipation    Palliative care encounter    Dehydration 12/17/2017   Goals of care, counseling/discussion 12/13/2017   Urothelial carcinoma of kidney, left ([place]) 12/07/2017   Right inguinal hernia 07/17/2015   Family history of colon cancer 07/17/2015    Allergic rhinitis 11/12/2014   Airway hyperreactivity 11/12/2014   Atherosclerosis of coronary artery 11/12/2014   Cheilitis 11/12/2014   Narrowing of intervertebral disc space 11/12/2014   Deflected nasal septum 11/12/2014   HLD (hyperlipidemia) 11/12/2014   BP (high blood pressure) 11/12/2014   Adult hypothyroidism 11/12/2014   Sleep apnea 11/12/2014   Diabetes mellitus, type 2 ([place]) 11/12/2014   Degenerative disc disease, lumbar 09/24/2012   Furunculosis 04/23/2012    Particia Lather, PT 05/26/2021, 4:21 PM  [place] MAIN [facility] SERVICES [address] [place], [place], [address] Phone: [phone]   Fax:  [phone]  Name: Patrick Mcgee MRN: [id number] Date of Birth: August 13, 1945

## 2021-05-30 ENCOUNTER — Inpatient Hospital Stay (HOSPITAL_BASED_OUTPATIENT_CLINIC_OR_DEPARTMENT_OTHER): Payer: HMO | Admitting: Hospice and Palliative Medicine

## 2021-05-30 DIAGNOSIS — C642 Malignant neoplasm of left kidney, except renal pelvis: Secondary | ICD-10-CM | POA: Diagnosis not present

## 2021-05-30 DIAGNOSIS — Z515 Encounter for palliative care: Secondary | ICD-10-CM | POA: Diagnosis not present

## 2021-05-30 NOTE — Progress Notes (Signed)
Virtual Visit via Telephon Note  I connected with Patrick Mcgee on 05/30/21 at  2:00 PM EST by telephone and verified that I am speaking with the correct person using two identifiers.   I discussed the limitations of evaluation and management by telemedicine and the availability of in person appointments. The patient expressed understanding and agreed to proceed.  History of Present Illness: [name] is a 75 y.o. 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  PMH is also notable for mild interstitial lung disease, diabetes, and CAD status post previous stenting.  Patient has been on maintenance Enfortumab.  Patient was referred to palliative care to help address goals and manage ongoing symptoms.   Observations/Objective: Spoke with patient by phone.  He reports being fatigued but denies any significant changes or concerns.  No distressing symptoms at present.  CT in October was suggestive of disease progression.  Patient is continued on Padcev with plan for repeat CT in December.  Patient tells me that if the neck CT reveals progression, he does not want to pursue additional lines of treatment and would instead just focus on comfort and hospice care.  He did have some questions regarding future pain management and we discussed that in detail.  Assessment and Plan: Stage IV urothelial cancer -on treatment with enfortumab.  Plan is for repeat CT next month.  Will follow.  Follow Up Instructions: Follow-up MyChart visit in 1 month   I discussed the assessment and treatment plan with the patient. The patient was provided an opportunity to ask questions and all were answered. The patient agreed with the plan and demonstrated an understanding of the instructions.   The patient was advised to call back or seek an in-person evaluation if the symptoms worsen or if the condition fails to  improve as anticipated.  I provided 10 minutes of non-face-to-face time during this encounter.   Irean Hong, NP

## 2021-05-31 ENCOUNTER — Ambulatory Visit: Payer: HMO | Admitting: Physical Therapy

## 2021-06-06 ENCOUNTER — Inpatient Hospital Stay: Payer: HMO

## 2021-06-06 ENCOUNTER — Other Ambulatory Visit: Payer: Self-pay

## 2021-06-06 VITALS — BP 101/68 | HR 96 | Temp 96.0°F | Resp 18 | Wt 148.2 lb

## 2021-06-06 DIAGNOSIS — Z5112 Encounter for antineoplastic immunotherapy: Secondary | ICD-10-CM | POA: Diagnosis not present

## 2021-06-06 DIAGNOSIS — C642 Malignant neoplasm of left kidney, except renal pelvis: Secondary | ICD-10-CM

## 2021-06-06 LAB — COMPREHENSIVE METABOLIC PANEL
ALT: 10 U/L (ref 0–44)
AST: 15 U/L (ref 15–41)
Albumin: 3.5 g/dL (ref 3.5–5.0)
Alkaline Phosphatase: 106 U/L (ref 38–126)
Anion gap: 10 (ref 5–15)
BUN: 15 mg/dL (ref 8–23)
CO2: 26 mmol/L (ref 22–32)
Calcium: 9.3 mg/dL (ref 8.9–10.3)
Chloride: 98 mmol/L (ref 98–111)
Creatinine, Ser: 0.82 mg/dL (ref 0.61–1.24)
GFR, Estimated: >60 mL/min (ref >60)
Glucose, Bld: 253 mg/dL — ABNORMAL HIGH (ref 70–99)
Potassium: 4 mmol/L (ref 3.5–5.1)
Sodium: 134 mmol/L — ABNORMAL LOW (ref 135–145)
Total Bilirubin: 0.8 mg/dL (ref 0.3–1.2)
Total Protein: 7.9 g/dL (ref 6.5–8.1)

## 2021-06-06 LAB — CBC WITH DIFFERENTIAL/PLATELET
Abs Immature Granulocytes: 0.03 10*3/uL (ref 0.00–0.07)
Basophils Absolute: 0.1 10*3/uL (ref 0.0–0.1)
Basophils Relative: 1 %
Eosinophils Absolute: 0.3 10*3/uL (ref 0.0–0.5)
Eosinophils Relative: 3 %
HCT: 33.5 % — ABNORMAL LOW (ref 39.0–52.0)
Hemoglobin: 10.8 g/dL — ABNORMAL LOW (ref 13.0–17.0)
Immature Granulocytes: 0 %
Lymphocytes Relative: 14 %
Lymphs Abs: 1.2 10*3/uL (ref 0.7–4.0)
MCH: 27.2 pg (ref 26.0–34.0)
MCHC: 32.2 g/dL (ref 30.0–36.0)
MCV: 84.4 fL (ref 80.0–100.0)
Monocytes Absolute: 0.7 10*3/uL (ref 0.1–1.0)
Monocytes Relative: 9 %
Neutro Abs: 6.4 10*3/uL (ref 1.7–7.7)
Neutrophils Relative %: 73 %
Platelets: 318 10*3/uL (ref 150–400)
RBC: 3.97 MIL/uL — ABNORMAL LOW (ref 4.22–5.81)
RDW: 14.9 % (ref 11.5–15.5)
WBC: 8.7 10*3/uL (ref 4.0–10.5)
nRBC: 0 % (ref 0.0–0.2)

## 2021-06-06 MED ORDER — HEPARIN SOD (PORK) LOCK FLUSH 100 UNIT/ML IV SOLN
INTRAVENOUS | Status: AC
  Filled 2021-06-06: qty 5

## 2021-06-06 MED ORDER — HEPARIN SOD (PORK) LOCK FLUSH 100 UNIT/ML IV SOLN
500.0000 [IU] | Freq: Once | INTRAVENOUS | Status: AC
  Administered 2021-06-06: 15:00:00 500 [IU] via INTRAVENOUS
  Filled 2021-06-06: qty 5

## 2021-06-06 MED ORDER — SODIUM CHLORIDE 0.9 % IV SOLN
Freq: Once | INTRAVENOUS | Status: AC
  Filled 2021-06-06: qty 250

## 2021-06-06 NOTE — Progress Notes (Signed)
1445: Glucose 253. Per Dr. Janese Banks hold chemo and reschedule in one week. Pt aware and printed copy of updated  appts given to pt. Per Dr. Janese Banks no further orders at this time and okay to discharge pt home. Pt and VS stable at discharge.

## 2021-06-07 ENCOUNTER — Ambulatory Visit: Payer: HMO | Admitting: Physical Therapy

## 2021-06-09 ENCOUNTER — Ambulatory Visit: Payer: HMO | Admitting: Physical Therapy

## 2021-06-13 ENCOUNTER — Other Ambulatory Visit: Payer: Self-pay

## 2021-06-13 ENCOUNTER — Inpatient Hospital Stay: Payer: HMO | Attending: Oncology

## 2021-06-13 ENCOUNTER — Inpatient Hospital Stay: Payer: HMO

## 2021-06-13 VITALS — BP 95/73 | HR 86 | Temp 96.3°F | Resp 18 | Wt 148.0 lb

## 2021-06-13 DIAGNOSIS — D701 Agranulocytosis secondary to cancer chemotherapy: Secondary | ICD-10-CM

## 2021-06-13 DIAGNOSIS — T451X5A Adverse effect of antineoplastic and immunosuppressive drugs, initial encounter: Secondary | ICD-10-CM

## 2021-06-13 DIAGNOSIS — C642 Malignant neoplasm of left kidney, except renal pelvis: Secondary | ICD-10-CM | POA: Diagnosis present

## 2021-06-13 DIAGNOSIS — C779 Secondary and unspecified malignant neoplasm of lymph node, unspecified: Secondary | ICD-10-CM | POA: Insufficient documentation

## 2021-06-13 DIAGNOSIS — Z95828 Presence of other vascular implants and grafts: Secondary | ICD-10-CM

## 2021-06-13 DIAGNOSIS — Z5112 Encounter for antineoplastic immunotherapy: Secondary | ICD-10-CM | POA: Insufficient documentation

## 2021-06-13 LAB — CBC WITH DIFFERENTIAL/PLATELET
Abs Immature Granulocytes: 0.03 10*3/uL (ref 0.00–0.07)
Basophils Absolute: 0.1 10*3/uL (ref 0.0–0.1)
Basophils Relative: 1 %
Eosinophils Absolute: 0.2 10*3/uL (ref 0.0–0.5)
Eosinophils Relative: 2 %
HCT: 33.1 % — ABNORMAL LOW (ref 39.0–52.0)
Hemoglobin: 10.9 g/dL — ABNORMAL LOW (ref 13.0–17.0)
Immature Granulocytes: 0 %
Lymphocytes Relative: 12 %
Lymphs Abs: 1.3 10*3/uL (ref 0.7–4.0)
MCH: 27.5 pg (ref 26.0–34.0)
MCHC: 32.9 g/dL (ref 30.0–36.0)
MCV: 83.4 fL (ref 80.0–100.0)
Monocytes Absolute: 1 10*3/uL (ref 0.1–1.0)
Monocytes Relative: 9 %
Neutro Abs: 8.3 10*3/uL — ABNORMAL HIGH (ref 1.7–7.7)
Neutrophils Relative %: 76 %
Platelets: 330 10*3/uL (ref 150–400)
RBC: 3.97 MIL/uL — ABNORMAL LOW (ref 4.22–5.81)
RDW: 15 % (ref 11.5–15.5)
WBC: 10.9 10*3/uL — ABNORMAL HIGH (ref 4.0–10.5)
nRBC: 0 % (ref 0.0–0.2)

## 2021-06-13 LAB — COMPREHENSIVE METABOLIC PANEL
ALT: 9 U/L (ref 0–44)
AST: 13 U/L — ABNORMAL LOW (ref 15–41)
Albumin: 3.4 g/dL — ABNORMAL LOW (ref 3.5–5.0)
Alkaline Phosphatase: 97 U/L (ref 38–126)
Anion gap: 12 (ref 5–15)
BUN: 15 mg/dL (ref 8–23)
CO2: 24 mmol/L (ref 22–32)
Calcium: 9.5 mg/dL (ref 8.9–10.3)
Chloride: 96 mmol/L — ABNORMAL LOW (ref 98–111)
Creatinine, Ser: 0.81 mg/dL (ref 0.61–1.24)
GFR, Estimated: >60 mL/min (ref >60)
Glucose, Bld: 163 mg/dL — ABNORMAL HIGH (ref 70–99)
Potassium: 3.9 mmol/L (ref 3.5–5.1)
Sodium: 132 mmol/L — ABNORMAL LOW (ref 135–145)
Total Bilirubin: 0.8 mg/dL (ref 0.3–1.2)
Total Protein: 7.9 g/dL (ref 6.5–8.1)

## 2021-06-13 MED ORDER — SODIUM CHLORIDE 0.9 % IV SOLN
1.0000 mg/kg | Freq: Once | INTRAVENOUS | Status: AC
  Administered 2021-06-13: 70 mg via INTRAVENOUS
  Filled 2021-06-13: qty 3

## 2021-06-13 MED ORDER — SODIUM CHLORIDE 0.9 % IV SOLN
Freq: Once | INTRAVENOUS | Status: AC
  Filled 2021-06-13: qty 250

## 2021-06-13 MED ORDER — PALONOSETRON HCL INJECTION 0.25 MG/5ML
0.2500 mg | Freq: Once | INTRAVENOUS | Status: AC
  Administered 2021-06-13: 0.25 mg via INTRAVENOUS
  Filled 2021-06-13: qty 5

## 2021-06-13 MED ORDER — HEPARIN SOD (PORK) LOCK FLUSH 100 UNIT/ML IV SOLN
500.0000 [IU] | Freq: Once | INTRAVENOUS | Status: AC
  Administered 2021-06-13: 500 [IU] via INTRAVENOUS
  Filled 2021-06-13: qty 5

## 2021-06-13 NOTE — Patient Instructions (Signed)
[facility] AT [place]  Discharge Instructions: Thank you for choosing [facility] to provide your oncology and hematology care.  If you have a lab appointment with the [facility], please go directly to the [facility] and check in at the registration area.  Wear comfortable clothing and clothing appropriate for easy access to any Portacath or PICC line.   We strive to give you quality time with your provider. You may need to reschedule your appointment if you arrive late (15 or more minutes).  Arriving late affects you and other patients whose appointments are after yours.  Also, if you miss three or more appointments without notifying the office, you may be dismissed from the clinic at the provider's discretion.      For prescription refill requests, have your pharmacy contact our office and allow 72 hours for refills to be completed.    Today you received the following chemotherapy and/or immunotherapy agents PADCEVF      To help prevent nausea and vomiting after your treatment, we encourage you to take your nausea medication as directed.  BELOW ARE SYMPTOMS THAT SHOULD BE REPORTED IMMEDIATELY: *FEVER GREATER THAN 100.4 F (38 C) OR HIGHER *CHILLS OR SWEATING *NAUSEA AND VOMITING THAT IS NOT CONTROLLED WITH YOUR NAUSEA MEDICATION *UNUSUAL SHORTNESS OF BREATH *UNUSUAL BRUISING OR BLEEDING *URINARY PROBLEMS (pain or burning when urinating, or frequent urination) *BOWEL PROBLEMS (unusual diarrhea, constipation, pain near the anus) TENDERNESS IN MOUTH AND THROAT WITH OR WITHOUT PRESENCE OF ULCERS (sore throat, sores in mouth, or a toothache) UNUSUAL RASH, SWELLING OR PAIN  UNUSUAL VAGINAL DISCHARGE OR ITCHING   Items with * indicate a potential emergency and should be followed up as soon as possible or go to the Emergency Department if any problems should occur.  Please show the CHEMOTHERAPY ALERT CARD or IMMUNOTHERAPY ALERT CARD at check-in to the  Emergency Department and triage nurse.  Should you have questions after your visit or need to cancel or reschedule your appointment, please contact [facility] CANCER [place] AT [place]  [phone] and follow the prompts.  Office hours are 8:00 a.m. to 4:30 p.m. Monday - Friday. Please note that voicemails left after 4:00 p.m. may not be returned until the following business day.  We are closed weekends and major holidays. You have access to a nurse at all times for urgent questions. Please call the main number to the clinic [phone] and follow the prompts.  For any non-urgent questions, you may also contact your provider using MyChart. We now offer e-Visits for anyone 101 and older to request care online for non-urgent symptoms. For details visit [email or web address].   Also download the MyChart app! Go to the app store, search "MyChart", open the app, select [facility], and log in with your MyChart username and password.  Due to Covid, a mask is required upon entering the hospital/clinic. If you do not have a mask, one will be given to you upon arrival. For doctor visits, patients may have 1 support person aged 34 or older with them. For treatment visits, patients cannot have anyone with them due to current Covid guidelines and our immunocompromised population.   Enfortumab vedotin injection What is this medication? ENFORTUMAB VEDOTIN (en FORT ue mab ve DOE tin) is a chemotherapy medicine and monoclonal antibody. It treats urothelial cancer. This medicine may be used for other purposes; ask your health care provider or pharmacist if you have questions. COMMON BRAND NAME(S): PADCEV What should I tell my  care team before I take this medication? They need to know if you have any of these conditions: diabetes (high blood sugar) eye disease, vision problems lung disease skin conditions or sensitivity tingling of the fingers or toes, or other nerve disorder an unusual or allergic  reaction to enfortumab vedotin, other medicines, foods, dyes or preservatives pregnant or trying to get pregnant breast-feeding How should I use this medication? This medicine is injected into a vein. It is given by a health care provider in a hospital or clinic setting. Talk to your health care provider about the use of this medicine in children. Special care may be needed. Overdosage: If you think you have taken too much of this medicine contact a poison control center or emergency room at once. NOTE: This medicine is only for you. Do not share this medicine with others. What if I miss a dose? Keep appointments for follow-up doses. It is important not to miss your dose. Call your doctor or health care provider if you are unable to keep an appointment. What may interact with this medication? This medicine may also interact with the following medications: certain antivirals for HIV certain medicines for fungal infections like ketoconazole, itraconazole, or posaconazole clarithromycin grapefruit juice mifepristone telithromycin This list may not describe all possible interactions. Give your health care provider a list of all the medicines, herbs, non-prescription drugs, or dietary supplements you use. Also tell them if you smoke, drink alcohol, or use illegal drugs. Some items may interact with your medicine. What should I watch for while using this medication? This medicine may make you feel generally unwell. This is not uncommon as chemotherapy can affect healthy cells as well as cancer cells. Report any side effects. Continue your course of treatment even though you feel ill unless your health care provider tells you to stop. Your condition will be monitored carefully while you are receiving this medicine. Do not become pregnant while taking this medicine or for 2 months after stopping it. Women should inform their health care provider if they wish to become pregnant or think they might be  pregnant. Men should not father a child while taking this medicine and for 4 months after stopping it. There is potential for serious side effects to an unborn child. Talk to your health care provider for more information. Do not breast-feed an infant while taking this medicine or for at least 3 weeks after stopping it. This medicine may make it more difficult to father a child. Talk to your health care provider if you are concerned about your fertility. This medicine may increase blood sugar. Ask your health care provider if changes in diet or medicines are needed if you have diabetes. This medicine can cause a serious condition in which there is too much acid in your blood. If you develop nausea, vomiting, stomach pain, unusual tiredness, or breathing problems, stop taking this medicine and call your health care provider right away. If possible, use a ketone dipstick to check for ketones in your urine. This medicine may cause dry eyes and blurred vision. If you wear contact lenses, you may feel some discomfort. Lubricating eye drops may help. See your health care provider if the problem does not go away or is severe. Tell your health care provider right away if you have any change in your eyesight. This medicine may increase your risk of getting an infection. Call your health care provider for advice if you get a fever, chills, or sore throat, or other  symptoms of a cold or flu. Do not treat yourself. Try to avoid being around people who are sick. This medicine may cause serious skin reactions. They can happen weeks to months after starting the medicine. Contact your health care provider right away if you notice fevers or flu-like symptoms with a rash. The rash may be red or purple and then turn into blisters or peeling of the skin. Or, you might notice a red rash with swelling of the face, lips or lymph nodes in your neck or under your arms. What side effects may I notice from receiving this  medication? Side effects that you should report to your doctor or health care professional as soon as possible: allergic reactions (skin rash, itching or hives; swelling of the face, lips, or tongue) blurred vision changes in vision cough dry eyes high blood sugar (increased hunger, thirst or urination; unusually weak or tired, blurry vision) infection (fever, chills, cough, sore throat, pain or trouble passing urine) pain, tingling, numbness in the hands or feet redness, blistering, peeling, bleeding, swelling, or loosening of the skin on the palms of your hands or soles of your feet or inside the mouth trouble breathing Side effects that usually do not require medical attention (report these to your doctor or health care professional if they continue or are bothersome): changes in taste diarrhea dry skin hair loss loss of appetite nausea, vomiting weak or tired This list may not describe all possible side effects. Call your doctor for medical advice about side effects. You may report side effects to FDA at 1-800-FDA-1088. Where should I keep my medication? This medicine is given in a hospital or clinic and will not be stored at home. NOTE: This sheet is a summary. It may not cover all possible information. If you have questions about this medicine, talk to your doctor, pharmacist, or health care provider.  2022 Elsevier/Gold Standard (2020-02-04 00:00:00)

## 2021-06-14 ENCOUNTER — Ambulatory Visit: Payer: HMO | Admitting: Physical Therapy

## 2021-06-16 ENCOUNTER — Encounter: Payer: Self-pay | Admitting: Hospice and Palliative Medicine

## 2021-06-16 ENCOUNTER — Ambulatory Visit: Payer: HMO | Admitting: Physical Therapy

## 2021-06-16 ENCOUNTER — Telehealth: Payer: Self-pay | Admitting: Hospice and Palliative Medicine

## 2021-06-16 NOTE — Telephone Encounter (Signed)
Spoke with patient and wife by phone.  Wife had a recent virus with negative testing for COVID/flu by urgent care, which then progressed to sinusitis.  That was about 10 days ago.  Patient now has 24 hours of rhinorrhea, sore throat, cough, fevers/chills, and muscle aches.  He took a home COVID test which was negative.  I recommended lab testing/PCR for COVID/flu.  If positive, would recommend treatment.  Otherwise, this sounds viral and we discussed supportive/symptomatic care at home.  ER triggers were also discussed.  Both patient and wife would like to avoid the ER for now if possible.

## 2021-06-17 ENCOUNTER — Inpatient Hospital Stay (HOSPITAL_BASED_OUTPATIENT_CLINIC_OR_DEPARTMENT_OTHER): Payer: HMO | Admitting: Hospice and Palliative Medicine

## 2021-06-17 DIAGNOSIS — C642 Malignant neoplasm of left kidney, except renal pelvis: Secondary | ICD-10-CM | POA: Diagnosis not present

## 2021-06-17 DIAGNOSIS — U071 COVID-19: Secondary | ICD-10-CM

## 2021-06-17 MED ORDER — NIRMATRELVIR/RITONAVIR (PAXLOVID)TABLET: 3.0000 | ORAL_TABLET | Freq: Two times a day (BID) | ORAL | 0 refills | Status: AC

## 2021-06-17 NOTE — Progress Notes (Signed)
Virtual Visit via Telephone Note  I connected with Patrick Mcgee on 06/17/21 at 11:30 AM EST by telephone and verified that I am speaking with the correct person using two identifiers.  Location: Patient: Home Provider: Clinic   I discussed the limitations, risks, security and privacy concerns of performing an evaluation and management service by telephone and the availability of in person appointments. I also discussed with the patient that there may be a patient responsible charge related to this service. The patient expressed understanding and agreed to proceed.   History of Present Illness: [name] is a 75 y.o. 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  PMH is also notable for mild interstitial lung disease, diabetes, and CAD status post previous stenting.  Patient has been on maintenance Enfortumab.  Patient was referred to palliative care to help address goals and manage ongoing symptoms.   Observations/Objective: Patient became symptomatic on 12/7 with rhinorrhea, sore throat, fatigue, cough, and muscle aches.  Initial home COVID test was negative.  I spoke with patient by phone yesterday and sent him to a COVID testing center with subsequent PCR testing positive.  Patient seems to be feeling slightly better today but is still symptomatic.  He is at high risk for developing complications given his cancer/treatment.  Discussed options for initiating treatment with Paxlovid and both patient and wife verbalized agreement.  Medications reviewed with pharmacy.  Patient instructed to hold atorvastatin and fluticasone while on Paxlovid.   ER triggers reviewed.  Patient recommended to obtain pulse oximeter and monitor SPO2 at home.  Assessment and Plan: COVID - start Paxlovid x 5 days (GFR >60). Discussed with pharmacy regarding medication interactions and atorvastatin and  fluticasone will both be held.  ER triggers and quarantining procedures reviewed with patient.  Patient pending upcoming CT and clinic visit with Beckey Rutter. Will reach out to Lauren to see if she wants to move these appointments in light of his diagnosis with COVID.  Follow Up Instructions: RTC as previously scheduled   I discussed the assessment and treatment plan with the patient. The patient was provided an opportunity to ask questions and all were answered. The patient agreed with the plan and demonstrated an understanding of the instructions.   The patient was advised to call back or seek an in-person evaluation if the symptoms worsen or if the condition fails to improve as anticipated.  I provided 15 minutes of non-face-to-face time during this encounter.   Irean Hong, NP

## 2021-06-20 ENCOUNTER — Ambulatory Visit: Payer: HMO

## 2021-06-20 ENCOUNTER — Ambulatory Visit: Payer: HMO | Admitting: Nurse Practitioner

## 2021-06-21 ENCOUNTER — Ambulatory Visit: Payer: HMO | Admitting: Physical Therapy

## 2021-06-23 ENCOUNTER — Ambulatory Visit: Payer: HMO | Admitting: Physical Therapy

## 2021-06-26 ENCOUNTER — Encounter: Payer: Self-pay | Admitting: Hospice and Palliative Medicine

## 2021-06-27 ENCOUNTER — Inpatient Hospital Stay: Payer: HMO

## 2021-06-27 ENCOUNTER — Inpatient Hospital Stay: Payer: HMO | Admitting: Nurse Practitioner

## 2021-06-27 ENCOUNTER — Inpatient Hospital Stay (HOSPITAL_BASED_OUTPATIENT_CLINIC_OR_DEPARTMENT_OTHER): Payer: HMO | Admitting: Hospice and Palliative Medicine

## 2021-06-27 ENCOUNTER — Ambulatory Visit: Admission: RE | Admit: 2021-06-27 | Payer: HMO | Source: Ambulatory Visit

## 2021-06-27 DIAGNOSIS — C642 Malignant neoplasm of left kidney, except renal pelvis: Secondary | ICD-10-CM | POA: Diagnosis not present

## 2021-06-27 DIAGNOSIS — J069 Acute upper respiratory infection, unspecified: Secondary | ICD-10-CM

## 2021-06-27 MED ORDER — HYDROCOD POLST-CPM POLST ER 10-8 MG/5ML PO SUER: 5.0000 mL | Freq: Two times a day (BID) | ORAL | 0 refills | Status: DC | PRN

## 2021-06-27 MED ORDER — LEVOFLOXACIN 500 MG PO TABS: 500.0000 mg | ORAL_TABLET | Freq: Every day | ORAL | 0 refills | Status: AC

## 2021-06-27 NOTE — Progress Notes (Signed)
Virtual Visit via Telephone Note  I connected with Patrick Mcgee on 06/27/21 at  9:15 AM EST by telephone and verified that I am speaking with the correct person using two identifiers.  Location: Patient: Home Provider: Clinic   I discussed the limitations, risks, security and privacy concerns of performing an evaluation and management service by telephone and the availability of in person appointments. I also discussed with the patient that there may be a patient responsible charge related to this service. The patient expressed understanding and agreed to proceed.   History of Present Illness: [name] is a 75 y.o. 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  PMH is also notable for mild interstitial lung disease, diabetes, and CAD status post previous stenting.  Patient has been on maintenance Enfortumab.  Patient was referred to palliative care to help address goals and manage ongoing symptoms.   Observations/Objective: Patient recently was diagnosed with COVID and completed was prescribed Paxlovid, which he completed on 12/13.  Initially, patient reported that symptoms were improving on Paxlovid but has since worsened.  He has pulmonary congestion with productive cough and green phlegm.  He denies fever or chills but remains fatigued.  He continues to have a sore throat, which he attributes primarily to his frequent coughing.  He denies shortness of breath or wheezing.  He is not currently utilizing symptomatic care.   Patient was scheduled for CT and NP visit today but canceled both of those.  Assessment and Plan: URI -I recommended chest x-ray and/or clinic eval but patient declined these.  We will start patient empirically on Levaquin.  Recommended symptomatic care with scheduled Mucinex.  We will start him on Tussionex for cough. I again discussed ER triggers and/or need  for clinic follow-up in the event that he worsens or fails to improve.  Also discussed with wife.  Follow Up Instructions: Follow-up telephone visit later this week   I discussed the assessment and treatment plan with the patient. The patient was provided an opportunity to ask questions and all were answered. The patient agreed with the plan and demonstrated an understanding of the instructions.   The patient was advised to call back or seek an in-person evaluation if the symptoms worsen or if the condition fails to improve as anticipated.  I provided 15 minutes of non-face-to-face time during this encounter.   Irean Hong, NP

## 2021-06-28 ENCOUNTER — Ambulatory Visit: Payer: HMO | Admitting: Physical Therapy

## 2021-06-28 ENCOUNTER — Other Ambulatory Visit: Payer: Self-pay

## 2021-06-28 ENCOUNTER — Telehealth: Payer: Self-pay

## 2021-06-28 MED ORDER — HYDROCODONE BIT-HOMATROP MBR 5-1.5 MG/5ML PO SOLN: 5.0000 mL | Freq: Four times a day (QID) | ORAL | 0 refills | Status: AC | PRN

## 2021-06-28 NOTE — Telephone Encounter (Signed)
Re: pt's mychart messsage. Discussed alternate cough suppressant options with Billey Chang, NP. Per Merrily Pew, he recommends Hycodan. Hycodan still not covered by Medicare, but can be obtained with coupon for $17 at [facility]. Pt and wife informed of this. Agreed to new Rx for Hycodan. Coupon sent to wife. Called [facility] and cancelled Tussinex Rx. New Rx for Hycodan sent.

## 2021-06-29 ENCOUNTER — Other Ambulatory Visit: Payer: Self-pay

## 2021-06-29 ENCOUNTER — Inpatient Hospital Stay (HOSPITAL_BASED_OUTPATIENT_CLINIC_OR_DEPARTMENT_OTHER): Payer: HMO | Admitting: Hospice and Palliative Medicine

## 2021-06-29 DIAGNOSIS — J069 Acute upper respiratory infection, unspecified: Secondary | ICD-10-CM | POA: Diagnosis not present

## 2021-06-29 NOTE — Progress Notes (Signed)
Virtual Visit via Telephone Note  I connected with Patrick Mcgee on 06/29/21 at  2:00 PM EST by telephone and verified that I am speaking with the correct person using two identifiers.  Location: Patient: Home Provider: Clinic   I discussed the limitations, risks, security and privacy concerns of performing an evaluation and management service by telephone and the availability of in person appointments. I also discussed with the patient that there may be a patient responsible charge related to this service. The patient expressed understanding and agreed to proceed.   History of Present Illness: [name] is a 75 y.o. 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  PMH is also notable for mild interstitial lung disease, diabetes, and CAD status post previous stenting.  Patient has been on maintenance Enfortumab.  Patient was referred to palliative care to help address goals and manage ongoing symptoms.   Observations/Objective: Patient recently was diagnosed with COVID and completed was prescribed Paxlovid, which he completed on 12/13.  Patient initially had improvement in respiratory symptoms and then had rebound worsening of congestion/cough.  I started him on Levaquin on 06/27/2021 as well as symptomatic care with Mucinex and antitussive.  Today, patient reports that he is feeling better.  He says that his symptoms are slowly resolving.  He does continue to have occasional cough but it is much improved.  He feels the Mucinex is breaking out his congestion.  He denies fever or chills or shortness of breath.  Assessment and Plan: URI -recommend completing antibiotic course.  Continue symptomatic care.  Patient may follow-up in clinic as needed  Follow Up Instructions: Patient is scheduled to see Dr. Janese Mcgee on 07/13/2021.  We are available to see him virtually for [facility] visits as  needed   I discussed the assessment and treatment plan with the patient. The patient was provided an opportunity to ask questions and all were answered. The patient agreed with the plan and demonstrated an understanding of the instructions.   The patient was advised to call back or seek an in-person evaluation if the symptoms worsen or if the condition fails to improve as anticipated.  I provided 5 minutes of non-face-to-face time during this encounter.   Irean Hong, NP

## 2021-06-30 ENCOUNTER — Ambulatory Visit: Payer: HMO | Admitting: Physical Therapy

## 2021-07-01 ENCOUNTER — Ambulatory Visit
Admission: RE | Admit: 2021-07-01 | Discharge: 2021-07-01 | Disposition: A | Discharge status: Home / Self Care | Payer: HMO | Source: Ambulatory Visit | Attending: Oncology | Admitting: Oncology

## 2021-07-01 ENCOUNTER — Other Ambulatory Visit: Payer: Self-pay

## 2021-07-01 DIAGNOSIS — C642 Malignant neoplasm of left kidney, except renal pelvis: Secondary | ICD-10-CM | POA: Diagnosis present

## 2021-07-01 MED ORDER — IOHEXOL 300 MG/ML  SOLN
100.0000 mL | Freq: Once | INTRAMUSCULAR | Status: AC | PRN
  Administered 2021-07-01: 11:00:00 100 mL via INTRAVENOUS

## 2021-07-05 ENCOUNTER — Ambulatory Visit: Payer: HMO | Admitting: Physical Therapy

## 2021-07-06 ENCOUNTER — Ambulatory Visit: Payer: HMO | Admitting: Oncology

## 2021-07-06 ENCOUNTER — Ambulatory Visit: Payer: HMO

## 2021-07-07 ENCOUNTER — Ambulatory Visit: Payer: HMO | Admitting: Physical Therapy

## 2021-07-09 ENCOUNTER — Other Ambulatory Visit: Payer: Self-pay | Admitting: Oncology

## 2021-07-10 ENCOUNTER — Encounter: Payer: Self-pay | Admitting: Oncology

## 2021-07-10 ENCOUNTER — Encounter: Payer: Self-pay | Admitting: Hematology and Oncology

## 2021-07-12 ENCOUNTER — Ambulatory Visit: Payer: HMO

## 2021-07-12 ENCOUNTER — Ambulatory Visit: Payer: HMO | Admitting: Oncology

## 2021-07-13 ENCOUNTER — Encounter: Payer: Self-pay | Admitting: Hematology and Oncology

## 2021-07-13 ENCOUNTER — Other Ambulatory Visit: Payer: Self-pay

## 2021-07-13 ENCOUNTER — Telehealth: Payer: Self-pay | Admitting: *Deleted

## 2021-07-13 ENCOUNTER — Encounter: Payer: Self-pay | Admitting: Oncology

## 2021-07-13 ENCOUNTER — Inpatient Hospital Stay: Payer: PPO | Attending: Oncology

## 2021-07-13 ENCOUNTER — Inpatient Hospital Stay: Payer: PPO

## 2021-07-13 ENCOUNTER — Inpatient Hospital Stay: Payer: PPO | Admitting: Oncology

## 2021-07-13 VITALS — BP 99/67 | HR 91 | Temp 96.5°F | Resp 17 | Wt 144.0 lb

## 2021-07-13 DIAGNOSIS — C642 Malignant neoplasm of left kidney, except renal pelvis: Secondary | ICD-10-CM

## 2021-07-13 DIAGNOSIS — Z923 Personal history of irradiation: Secondary | ICD-10-CM | POA: Diagnosis not present

## 2021-07-13 DIAGNOSIS — Z79899 Other long term (current) drug therapy: Secondary | ICD-10-CM | POA: Diagnosis not present

## 2021-07-13 DIAGNOSIS — R911 Solitary pulmonary nodule: Secondary | ICD-10-CM | POA: Diagnosis not present

## 2021-07-13 DIAGNOSIS — E538 Deficiency of other specified B group vitamins: Secondary | ICD-10-CM | POA: Diagnosis not present

## 2021-07-13 DIAGNOSIS — C779 Secondary and unspecified malignant neoplasm of lymph node, unspecified: Secondary | ICD-10-CM | POA: Diagnosis not present

## 2021-07-13 DIAGNOSIS — Z87891 Personal history of nicotine dependence: Secondary | ICD-10-CM | POA: Insufficient documentation

## 2021-07-13 DIAGNOSIS — Z7984 Long term (current) use of oral hypoglycemic drugs: Secondary | ICD-10-CM | POA: Diagnosis not present

## 2021-07-13 DIAGNOSIS — Z7189 Other specified counseling: Secondary | ICD-10-CM | POA: Diagnosis not present

## 2021-07-13 DIAGNOSIS — I2510 Atherosclerotic heart disease of native coronary artery without angina pectoris: Secondary | ICD-10-CM | POA: Insufficient documentation

## 2021-07-13 DIAGNOSIS — Z8616 Personal history of COVID-19: Secondary | ICD-10-CM | POA: Insufficient documentation

## 2021-07-13 DIAGNOSIS — E1140 Type 2 diabetes mellitus with diabetic neuropathy, unspecified: Secondary | ICD-10-CM | POA: Diagnosis not present

## 2021-07-13 LAB — CBC WITH DIFFERENTIAL/PLATELET
Abs Immature Granulocytes: 0.04 10*3/uL (ref 0.00–0.07)
Basophils Absolute: 0 10*3/uL (ref 0.0–0.1)
Basophils Relative: 0 %
Eosinophils Absolute: 0.1 10*3/uL (ref 0.0–0.5)
Eosinophils Relative: 1 %
HCT: 30 % — ABNORMAL LOW (ref 39.0–52.0)
Hemoglobin: 9.6 g/dL — ABNORMAL LOW (ref 13.0–17.0)
Immature Granulocytes: 0 %
Lymphocytes Relative: 12 %
Lymphs Abs: 1.2 10*3/uL (ref 0.7–4.0)
MCH: 26.3 pg (ref 26.0–34.0)
MCHC: 32 g/dL (ref 30.0–36.0)
MCV: 82.2 fL (ref 80.0–100.0)
Monocytes Absolute: 0.8 10*3/uL (ref 0.1–1.0)
Monocytes Relative: 9 %
Neutro Abs: 7.5 10*3/uL (ref 1.7–7.7)
Neutrophils Relative %: 78 %
Platelets: 384 10*3/uL (ref 150–400)
RBC: 3.65 MIL/uL — ABNORMAL LOW (ref 4.22–5.81)
RDW: 15.9 % — ABNORMAL HIGH (ref 11.5–15.5)
WBC: 9.7 10*3/uL (ref 4.0–10.5)
nRBC: 0 % (ref 0.0–0.2)

## 2021-07-13 LAB — COMPREHENSIVE METABOLIC PANEL
ALT: 7 U/L (ref 0–44)
AST: 13 U/L — ABNORMAL LOW (ref 15–41)
Albumin: 3 g/dL — ABNORMAL LOW (ref 3.5–5.0)
Alkaline Phosphatase: 98 U/L (ref 38–126)
Anion gap: 8 (ref 5–15)
BUN: 19 mg/dL (ref 8–23)
CO2: 24 mmol/L (ref 22–32)
Calcium: 9.2 mg/dL (ref 8.9–10.3)
Chloride: 99 mmol/L (ref 98–111)
Creatinine, Ser: 0.8 mg/dL (ref 0.61–1.24)
GFR, Estimated: >60 mL/min (ref >60)
Glucose, Bld: 153 mg/dL — ABNORMAL HIGH (ref 70–99)
Potassium: 4.3 mmol/L (ref 3.5–5.1)
Sodium: 131 mmol/L — ABNORMAL LOW (ref 135–145)
Total Bilirubin: 0.6 mg/dL (ref 0.3–1.2)
Total Protein: 7.6 g/dL (ref 6.5–8.1)

## 2021-07-13 MED ORDER — HEPARIN SOD (PORK) LOCK FLUSH 100 UNIT/ML IV SOLN
500.0000 [IU] | Freq: Once | INTRAVENOUS | Status: AC
  Administered 2021-07-13: 500 [IU] via INTRAVENOUS
  Filled 2021-07-13: qty 5

## 2021-07-13 MED ORDER — SODIUM CHLORIDE 0.9% FLUSH
10.0000 mL | Freq: Once | INTRAVENOUS | Status: AC
  Administered 2021-07-13: 10 mL via INTRAVENOUS
  Filled 2021-07-13: qty 10

## 2021-07-13 NOTE — Progress Notes (Signed)
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 [name] so far for his metastatic urothelial carcinoma.  This was originally diagnosed in May 2019.  He was noted to have a filling defect in the lower pole collecting system of the left kidney along with para-aortic and retroperitoneal adenopathy concerning for metastatic disease.  He underwent left ureteroscopy and renal pelvis biopsy which revealed small fragments of high-grade urothelial carcinoma with small focus of invasion.  He was started on carboplatin and gemcitabine chemotherapy in June 2019 and was continued on 05/27/2018 for 8 cycles.  He tolerated chemotherapy well except for chemo-induced anemia for which she has been getting Procrit every 2 weeks.  He did have response to his disease based on scans in August 2019.  However repeat scan on 05/31/2018 showed increase in the size of the primary tumor from 1.2 to 1.9 cm and increase in left para-aortic adenopathy from 0.9 to 1.3 cm.  Periportal adenopathy was stable at 1.4 cm.  Second line immunotherapy was recommended.  His initial biopsy specimen did not have enough sample to undergo FGFR mutation testing.  He also has mild interstitial lung disease for which he sees  pulmonary but he is not on home oxygen.  He has B12 deficiency for which he is on oral B12.  Also has diabetes and coronary artery disease.  Tecentriq started on 06/17/2018.  Patient noted to have disease progression in his lymph nodes in May 2020.  Repeat biopsy showed metastatic urothelial carcinoma which did not have a FGFR mutation.  He has been started on third line Padcev   Treatment on hold since April 2021 due to neuropathy and to maintain quality of life but restarted in August 2021 after he was found to have progression of disease which he has 1 week on 3 weeks off   CT scan in June 2022 showed increase in the size of primary urothelial mass but no other evidence of metastatic disease elsewhere.  He completed palliative radiation to the mass in July 2022    Interval history-patient was recently diagnosed with COVID in early December 2022.  He is still quite fatigued and his mobility has decreased since then.  Appetite is poor.  ECOG PS- 3 Pain scale- 0  Review of systems- Review of Systems  Constitutional:  Positive for malaise/fatigue and weight loss. Negative for chills and fever.  HENT:  Negative for congestion, ear discharge and nosebleeds.   Eyes:  Negative for blurred vision.  Respiratory:  Negative for cough, hemoptysis, sputum production, shortness of breath and wheezing.   Cardiovascular:  Negative for chest pain, palpitations, orthopnea and claudication.  Gastrointestinal:  Negative for abdominal pain, blood in stool, constipation, diarrhea, heartburn, melena, nausea and vomiting.  Genitourinary:  Negative for dysuria, flank pain, frequency, hematuria and urgency.  Musculoskeletal:  Negative for back pain, joint pain and myalgias.  Skin:  Negative for rash.  Neurological:  Negative for dizziness, tingling, focal weakness, seizures, weakness and headaches.  Endo/Heme/Allergies:  Does not bruise/bleed easily.  Psychiatric/Behavioral:  Negative for depression and suicidal  ideas. The patient does not have insomnia.      Allergies  Allergen Reactions   Sulfa Antibiotics Other (See Comments)    Joint pain   Ace Inhibitors Cough   Invokana [Canagliflozin] Other (See Comments)    Leg pain   Penicillins Rash    Did it involve swelling of the face/tongue/throat, SOB, or low BP? No Did it involve sudden or severe rash/hives, skin peeling, or any reaction on the inside of your mouth or nose? Yes Did you need to seek medical attention at a hospital or doctor's office? Yes When did it last happen?      childhood allergy If all above answers are "NO", may proceed with cephalosporin use.      Past Medical History:  Diagnosis Date   Acid reflux    Anxiety    Depression    Diabetes mellitus without complication ([place])    Hyperlipidemia    Hypertension    MRSA (methicillin resistant Staphylococcus aureus) infection 1992   history   Myocardial infarction ([place]) 1995   Sleep apnea    CPAP   Urothelial cancer ([place]) 11/2017   Left Urothelial mass, chemo tx's     Past Surgical History:  Procedure Laterality Date   CATARACT EXTRACTION Left    COLONOSCOPY  2010   [facility]   COLONOSCOPY WITH PROPOFOL N/A 10/04/2016   Procedure: COLONOSCOPY WITH PROPOFOL;  Surgeon: [name], MD;  Location: [place];  Service: Endoscopy;  Laterality: N/A;   [place], 2000, 2001, 2014   CYSTOSCOPY W/ RETROGRADES Bilateral 12/10/2017   Procedure: CYSTOSCOPY WITH RETROGRADE PYELOGRAM;  Surgeon: [name], MD;  Location: [facility];  Service: Urology;  Laterality: Bilateral;   CYSTOSCOPY W/ RETROGRADES Left 12/09/2018   Procedure: CYSTOSCOPY WITH RETROGRADE PYELOGRAM;  Surgeon: [name], MD;  Location: [facility];  Service: Urology;  Laterality: Left;   CYSTOSCOPY WITH BIOPSY Left 12/09/2018   Procedure: CYSTOSCOPY WITH URETERAL/RENAL PELVIC BIOPSY;  Surgeon: [name], MD;  Location: [facility];  Service: Urology;   Laterality: Left;   CYSTOSCOPY WITH STENT PLACEMENT Left 12/10/2017   Procedure: CYSTOSCOPY WITH STENT PLACEMENT;  Surgeon: [name], MD;  Location: [facility];  Service: Urology;  Laterality: Left;   CYSTOSCOPY WITH STENT PLACEMENT Left 12/09/2018   Procedure: CYSTOSCOPY WITH STENT PLACEMENT;  Surgeon: [name], MD;  Location: [facility];  Service: Urology;  Laterality: Left;   CYSTOSCOPY WITH URETEROSCOPY Left 12/09/2018   Procedure: CYSTOSCOPY WITH URETEROSCOPY;  Surgeon: [name], MD;  Location: [facility];  Service: Urology;  Laterality: Left;   EYE SURGERY Bilateral    cataract   INGUINAL HERNIA REPAIR Right 08/09/2015   Procedure: HERNIA REPAIR INGUINAL ADULT;  Surgeon: [name], MD;  Location: [facility];  Service: General;  Laterality: Right;   NASAL SINUS SURGERY     nuclear stress test     PORTA CATH INSERTION N/A 12/19/2017   Procedure: PORTA CATH INSERTION;  Surgeon: [name], MD;  Location: [place] [facility];  Service: Cardiovascular;  Laterality: N/A;   TOOTH EXTRACTION  05/28/2020   upper left and it was molars   URETERAL BIOPSY Left 12/10/2017   Procedure: URETERAL & renal PELVIS BIOPSY;  Surgeon: [name], MD;  Location: [facility];  Service: Urology;  Laterality: Left;   URETEROSCOPY Left 12/10/2017   Procedure: URETEROSCOPY;  Surgeon: [name], MD;  Location: [facility];  Service: Urology;  Laterality: Left;    Social History   Socioeconomic History   Marital status: Married    Spouse name: [name]   Number of children: 1   Years of education: Not on file   Highest education level: Associate degree: occupational, Hotel manager, or vocational program  Occupational History   Occupation: retired  Tobacco Use   Smoking status: Former    Types: Cigars    Quit date: 10/09/1978    Years since quitting: 42.7   Smokeless tobacco: Former    Types: Chew    Quit date: 12/04/1988   Tobacco comments:    on occasion  Vaping Use   Vaping Use: Never  used  Substance and Sexual Activity   Alcohol use: Yes    Comment: beer once a week   Drug use: No   Sexual activity: Not Currently  Other Topics Concern   Not on file  Social History Narrative   Not on file   Social Determinants of Health   Financial Resource Strain: Not on file  Food Insecurity: Not on file  Transportation Needs: Not on file  Physical Activity: Not on file  Stress: Not on file  Social Connections: Not on file  Intimate Partner Violence: Not on file    Family History  Problem Relation Age of Onset   Heart disease Mother    Cancer Father        Lung and colon cancer   Heart disease Father    Emphysema Maternal Grandfather    Tuberculosis Maternal Grandmother      Current Outpatient Medications:    aspirin EC 81 MG tablet, Take 81 mg by mouth every evening. , Disp: , Rfl:    atorvastatin (LIPITOR) 40 MG tablet, TAKE 1 TABLET BY MOUTH AT BEDTIME, Disp: 90 tablet, Rfl: 3   blood glucose meter kit and supplies, Dispense based on patient and insurance preference. Use once daily as directed. (FOR ICD-10 E11.9)., Disp: 1 each, Rfl: 0   Blood Glucose Monitoring Suppl (ONE TOUCH ULTRA 2) w/Device KIT, 1 each by Does not apply route once for 1 dose. Test blood sugars once daily, Disp: 1 kit, Rfl: 0   Cholecalciferol (VITAMIN D3) 25 MCG (1000 UT) CAPS, Take 1 capsule by mouth daily., Disp: , Rfl:    clobetasol cream (TEMOVATE) 4.23 %, Apply 1 application topically as needed (for skin rash)., Disp: 30 g, Rfl: 1   fluticasone (FLONASE) 50 MCG/ACT nasal spray, Use 2 spray(s) in each nostril once daily, Disp: 48 g, Rfl: 3   glucose blood (ONETOUCH ULTRA) test strip, Use as instructed, Disp: 100 each, Rfl: 12   HYDROcodone bit-homatropine (HYCODAN) 5-1.5 MG/5ML syrup, Take 5 mLs by mouth every 6 (six) hours as needed for cough., Disp: 120 mL, Rfl: 0   JARDIANCE 10 MG TABS tablet, Take 1 tablet by mouth once daily, Disp: 90 tablet, Rfl: 1   Lancets (ONETOUCH ULTRASOFT)  lancets, Use as instructed, Disp: 100 each, Rfl: 12   lidocaine-prilocaine (EMLA) cream, Apply 1 application topically as needed (port access)., Disp: 1 g, Rfl: 3   losartan (COZAAR) 50 MG tablet, Take 1 tablet (50 mg total) by mouth daily., Disp: 90 tablet, Rfl: 1   magnesium hydroxide (MILK OF MAGNESIA) 400 MG/5ML suspension, Take 15 mLs by mouth daily as needed for mild constipation., Disp: , Rfl:  metFORMIN (GLUCOPHAGE) 1000 MG tablet, TAKE 1 TABLET BY MOUTH TWICE DAILY WITH MEALS, Disp: 180 tablet, Rfl: 0   OVER THE COUNTER MEDICATION, Apply 1 application topically daily as needed (pain). Outback topical pain relief, Disp: , Rfl:    pregabalin (LYRICA) 75 MG capsule, Take 1 capsule by mouth once daily, Disp: 90 capsule, Rfl: 0   sertraline (ZOLOFT) 50 MG tablet, Take 1 tablet (50 mg total) by mouth daily., Disp: 90 tablet, Rfl: 3   vitamin B-12 (CYANOCOBALAMIN) 1000 MCG tablet, Take by mouth daily. Unsure dose, Disp: , Rfl:    vitamin C (ASCORBIC ACID) 500 MG tablet, Take 1,000-1,500 mg by mouth 2 (two) times daily., Disp: , Rfl:    hydrOXYzine (ATARAX/VISTARIL) 25 MG tablet, Take 1 tablet (25 mg total) by mouth every 8 (eight) hours as needed for itching. (Patient not taking: Reported on 05/23/2021), Disp: 30 tablet, Rfl: 3   Ivermectin 1 % CREA, Apply 1 application topically daily as needed (rosacea). To face (Patient not taking: Reported on 05/23/2021), Disp: , Rfl:    levofloxacin (LEVAQUIN) 500 MG tablet, Take 1 tablet (500 mg total) by mouth daily., Disp: 10 tablet, Rfl: 0   nystatin cream (MYCOSTATIN), Apply topically 2 (two) times daily. (Patient not taking: Reported on 05/23/2021), Disp: 15 g, Rfl: 1   polyethylene glycol (MIRALAX / GLYCOLAX) 17 g packet, Take 17 g by mouth daily as needed. (Patient not taking: Reported on 07/13/2021), Disp: , Rfl:    psyllium (METAMUCIL) 58.6 % powder, Take 1 packet by mouth daily. (Patient not taking: Reported on 05/23/2021), Disp: , Rfl:  No current  facility-administered medications for this visit.  Facility-Administered Medications Ordered in Other Visits:    Influenza vac split quadrivalent PF (FLUZONE HIGH-DOSE) injection 0.5 mL, 0.5 mL, Intramuscular, Once, Karen Kitchens, NP  Physical exam:  Vitals:   07/13/21 0931  BP: 99/67  Pulse: 91  Resp: 17  Temp: (!) 96.5 F (35.8 C)  TempSrc: Tympanic  SpO2: 98%  Weight: 144 lb (65.3 kg)   Physical Exam Constitutional:      Comments: Patient appears frail and is sitting in a wheelchair.    Cardiovascular:     Rate and Rhythm: Normal rate and regular rhythm.     Heart sounds: Normal heart sounds.  Pulmonary:     Effort: Pulmonary effort is normal.     Breath sounds: Normal breath sounds.  Skin:    General: Skin is warm and dry.  Neurological:     Mental Status: He is alert and oriented to person, place, and time.     CMP Latest Ref Rng & Units 07/13/2021  Glucose 70 - 99 mg/dL 153(H)  BUN 8 - 23 mg/dL 19  Creatinine 0.61 - 1.24 mg/dL 0.80  Sodium 135 - 145 mmol/L 131(L)  Potassium 3.5 - 5.1 mmol/L 4.3  Chloride 98 - 111 mmol/L 99  CO2 22 - 32 mmol/L 24  Calcium 8.9 - 10.3 mg/dL 9.2  Total Protein 6.5 - 8.1 g/dL 7.6  Total Bilirubin 0.3 - 1.2 mg/dL 0.6  Alkaline Phos 38 - 126 U/L 98  AST 15 - 41 U/L 13(L)  ALT 0 - 44 U/L 7   CBC Latest Ref Rng & Units 07/13/2021  WBC 4.0 - 10.5 K/uL 9.7  Hemoglobin 13.0 - 17.0 g/dL 9.6(L)  Hematocrit 39.0 - 52.0 % 30.0(L)  Platelets 150 - 400 K/uL 384    No images are attached to the encounter.  CT CHEST ABDOMEN PELVIS W  CONTRAST  Result Date: 07/01/2021 CLINICAL DATA:  Metastatic upper urothelial carcinoma. EXAM: CT CHEST, ABDOMEN, AND PELVIS WITH CONTRAST TECHNIQUE: Multidetector CT imaging of the chest, abdomen and pelvis was performed following the standard protocol during bolus administration of intravenous contrast. CONTRAST:  161m OMNIPAQUE IOHEXOL 300 MG/ML  SOLN COMPARISON:  CT 04/20/2021 FINDINGS: CT CHEST FINDINGS  Cardiovascular: No significant vascular findings. Normal heart size. No pericardial effusion. Mediastinum/Nodes: No axillary or supraclavicular adenopathy. No mediastinal or hilar adenopathy. No pericardial fluid. Esophagus normal. Port in the anterior chest wall with tip in distal SVC. Lungs/Pleura: RIGHT lower lobe nodule of concern has progressed to cavitation and enlarged to 11 mm (image 90/series 4) increased from 7 mm. Musculoskeletal: No aggressive osseous lesion. CT ABDOMEN AND PELVIS FINDINGS Hepatobiliary: No focal hepatic lesion. No biliary ductal dilatation. Gallbladder is normal. Common bile duct is normal. Pancreas: Fatty replacement pancreas Spleen: Normal spleen Adrenals/urinary tract: Adrenal glands normal. Enhancing lesion in the central and upper LEFT renal pelvis is increased in size measuring 3.8 x 3.3 cm (image 64/series 2) compared to 3.4 x 2.5 cm. Mass involves the renal pelvis and extends into the renal parenchyma seen on coronal image 80/5. The LEFT renal vein is patent. Several small LEFT periaortic lymph nodes are slightly more prominent. Example 6 mm lymph node on image 68/2 compares to 4 mm. Bladder is normal. RIGHT kidney normal. There 3 simple fluid attenuation cyst of the RIGHT kidney. Stomach/Bowel: Stomach, small bowel, appendix, and cecum are normal. The colon and rectosigmoid colon are normal. Vascular/Lymphatic: Abdominal aorta is normal caliber. There is no retroperitoneal or periportal lymphadenopathy. No pelvic lymphadenopathy. Reproductive: Prostate unremarkable Other: No free fluid. Musculoskeletal: No aggressive osseous lesion. IMPRESSION: Chest Impression: 1. Interval enlargement and cavitation of RIGHT lower lobe pulmonary nodule consistent with pulmonary metastasis. No additional pulmonary nodules identified. 2. No mediastinal lymphadenopathy. Abdomen / Pelvis Impression: 1. Interval enlargement of LEFT renal pelvis mass which extends in the renal parenchyma. 2. Mild  interval enlargement of LEFT periaortic lymph nodes. Electronically Signed   By: SSuzy BouchardM.D.   On: 07/01/2021 17:27     Assessment and plan- Patient is a 76y.o. male with metastatic urothelial carcinoma here to discuss CT scan results and further management  I have reviewed CT chest abdomen pelvis images independently and discussed findings with the patient and his wife.Overall patient has had a slow but steady progression of disease since July 2022.  His most recent scan from 07/01/2021 shows further increase in the size of the left renal pelvis mass.  Prominent periaortic lymph nodes concerning for metastatic disease.  Progression in the size of the right lower lobe lung nodule also concerning for progression of metastatic disease.  Patient has had disease progression on gemcitabine cisplatin followed by Tecentriq and most recently Padcev.  Other single line chemotherapy agent such as paclitaxel or docetaxel are unlikely to improve his quality of life or overall survival.  Moreover these agents are associated with worsening of peripheral neuropathy which the patient is already dealing with.  Patient's performance status further declined after his recent episode of COVID.  Keeping all these factors in mind I would recommend proceeding with best supportive care/hospice.  Patient already has a core care palliative care in place and we will get in touch with them to transition him to home hospice.  No further lab tests doctors appointments or scans needed at this time.  Patient can get in touch with [place] he has any questions  or concerns.  Patient and his wife verbalized understanding of the plan    Visit Diagnosis 1. Urothelial carcinoma of kidney, left ([place])   2. Goals of care, counseling/discussion      Dr. Randa Evens, MD, MPH [facility] [phone] 07/13/2021 12:38 PM

## 2021-07-13 NOTE — Progress Notes (Signed)
Patient here for oncology follow-up appointment, concerns of recent falls, neuropathy and low BP

## 2021-07-13 NOTE — Telephone Encounter (Signed)
Pt . No longer want chemo and would like hospice services and I have put in the ref. And called and spoke to intake that they are wanting hospice of palliative care

## 2021-07-13 NOTE — Telephone Encounter (Signed)
note

## 2021-07-18 ENCOUNTER — Telehealth: Payer: Self-pay | Admitting: Hospice and Palliative Medicine

## 2021-07-18 NOTE — Telephone Encounter (Signed)
I spoke with hospice nurse, Amy.  Patient has some residual nasal congestion from his recent COVID infection.  We discussed symptomatic care including use of fluticasone and Mucinex.

## 2021-07-28 ENCOUNTER — Telehealth: Payer: HMO | Admitting: Hospice and Palliative Medicine

## 2021-08-05 ENCOUNTER — Other Ambulatory Visit: Payer: Self-pay | Admitting: Hospice and Palliative Medicine

## 2021-08-05 MED ORDER — MORPHINE SULFATE (CONCENTRATE) 10 MG /0.5 ML PO SOLN: 5.0000 mg | ORAL | 0 refills | Status: AC | PRN

## 2021-08-05 NOTE — Progress Notes (Signed)
Spoke with hospice nurse, Amy.  She requested morphine elixir as part of patient's comfort medications.  Patient is having generalized pain/shortness of breath.  Will send Rx to pharmacy

## 2021-08-09 ENCOUNTER — Telehealth: Payer: Self-pay | Admitting: Family Medicine

## 2021-08-09 NOTE — Telephone Encounter (Signed)
Amy from [facility], called in states patient is now in hospice as of  Jan 6.and requested appts be cancelled,

## 2021-08-16 ENCOUNTER — Telehealth: Payer: Self-pay | Admitting: *Deleted

## 2021-08-16 NOTE — Telephone Encounter (Signed)
Attempted PA via covermymeds, but PA is not needed. Amt dispensed (100 strips) exceeded the allotted amt approved by insurance. I called and spoke with pt's pharmacy at [place]. I was informed that the claim was approved. No additional PA is needed at this time. Patient is also under hospice care. Hospice nurse had called in the RF under Patrick Mcgee's name. Note: scripts for diabetes supplies was previously filled by Dr. Rosanna Randy.

## 2021-08-16 NOTE — Telephone Encounter (Signed)
-----   Message from Secundino Ginger sent at 08/16/2021 12:45 PM EST ----- Regarding: [place] sent to chart for one touch ultra strips.

## 2021-08-22 ENCOUNTER — Ambulatory Visit: Payer: HMO | Admitting: Radiation Oncology

## 2021-08-29 ENCOUNTER — Ambulatory Visit: Payer: HMO | Admitting: Family Medicine

## 2021-09-07 ENCOUNTER — Other Ambulatory Visit: Payer: Self-pay | Admitting: Family Medicine

## 2021-09-07 NOTE — Telephone Encounter (Signed)
Called [facility] to verify last dispensed medication. Last dispensed medication in September 2022. Called [facility] and reviewed medication request. Hospice sent request to [facility] for 28 tabs due to how much Hospice will pay. Will send to practice for refill order. ?

## 2021-09-07 NOTE — Telephone Encounter (Signed)
Requested medication (s) are due for refill today: yes ? ?Requested medication (s) are on the active medication list: yes ? ?Last refill:  03/21/21 #180 0 refills ? ?Future visit scheduled: no  ? ?Notes to clinic:  do you want to refill for #28  0 refills or #180 0 refills. Requesting sent by Hospice and will cover #28 . Please advise ? ? ?  ?Requested Prescriptions  ?Pending Prescriptions Disp Refills  ? metFORMIN (GLUCOPHAGE) 1000 MG tablet [Pharmacy Med Name: METFORMIN HCL 1000 MG TAB] 28 tablet   ?  Sig: TAKE ONE (1) TABLET BY MOUTH TWO TIMES PER DAY  ?  ? Endocrinology:  Diabetes - Biguanides Failed - 09/07/2021  2:26 PM  ?  ?  Failed - B12 Level in normal range and within 720 days  ?  Vitamin B-12  ?Date Value Ref Range Status  ?04/19/2021 >2000 (H) 232 - 1245 pg/mL Final  ?  ?  ?  ?  Passed - Cr in normal range and within 360 days  ?  Creatinine  ?Date Value Ref Range Status  ?02/03/2013 0.69 0.60 - 1.30 mg/dL Final  ? ?Creatinine, Ser  ?Date Value Ref Range Status  ?07/13/2021 0.80 0.61 - 1.24 mg/dL Final  ?  ?  ?  ?  Passed - HBA1C is between 0 and 7.9 and within 180 days  ?  Hgb A1c MFr Bld  ?Date Value Ref Range Status  ?04/19/2021 7.6 (H) 4.8 - 5.6 % Final  ?  Comment:  ?           Prediabetes: 5.7 - 6.4 ?         Diabetes: >6.4 ?         Glycemic control for adults with diabetes: <7.0 ?  ?  ?  ?  ?  Passed - eGFR in normal range and within 360 days  ?  EGFR (African American)  ?Date Value Ref Range Status  ?02/03/2013 >60  Final  ? ?GFR calc Af Wyvonnia Lora  ?Date Value Ref Range Status  ?04/06/2020 >60 >60 mL/min Final  ? ?EGFR (Non-African Amer.)  ?Date Value Ref Range Status  ?02/03/2013 >60  Final  ?  Comment:  ?  eGFR values <92mL/min/1.73 m2 may be an indication of chronic ?kidney disease (CKD). ?Calculated eGFR is useful in patients with stable renal function. ?The eGFR calculation will not be reliable in acutely ill patients ?when serum creatinine is changing rapidly. It is not useful in  ?patients on  dialysis. The eGFR calculation may not be applicable ?to patients at the low and high extremes of body sizes, pregnant ?women, and vegetarians. ?  ? ?GFR, Estimated  ?Date Value Ref Range Status  ?07/13/2021 >60 >60 mL/min Final  ?  Comment:  ?  (NOTE) ?Calculated using the CKD-EPI Creatinine Equation (2021) ?  ?  ?  ?  ?  Passed - Valid encounter within last 6 months  ?  Recent Outpatient Visits   ? ?      ? 5 months ago Medicare annual wellness visit, subsequent  ? [facility] Jerrol Banana., MD  ? 10 months ago Type 2 diabetes mellitus without complication, without long-term current use of insulin ([place])  ? [facility] Jerrol Banana., MD  ? 1 year ago Medicare annual wellness visit, subsequent  ? [facility] Jerrol Banana., MD  ? 1 year ago Type 2 diabetes mellitus without complication, without long-term current use  of insulin ([place])  ? [facility] Jerrol Banana., MD  ? 2 years ago Type 2 diabetes mellitus without complication, without long-term current use of insulin ([place])  ? [facility] Jerrol Banana., MD  ? ?  ?  ? ?  ?  ?  Passed - CBC within normal limits and completed in the last 12 months  ?  WBC  ?Date Value Ref Range Status  ?07/13/2021 9.7 4.0 - 10.5 K/uL Final  ? ?RBC  ?Date Value Ref Range Status  ?07/13/2021 3.65 (L) 4.22 - 5.81 MIL/uL Final  ? ?Hemoglobin  ?Date Value Ref Range Status  ?07/13/2021 9.6 (L) 13.0 - 17.0 g/dL Final  ?03/15/2017 13.7 13.0 - 17.7 g/dL Final  ? ?HCT  ?Date Value Ref Range Status  ?07/13/2021 30.0 (L) 39.0 - 52.0 % Final  ? ?Hematocrit  ?Date Value Ref Range Status  ?03/15/2017 39.7 37.5 - 51.0 % Final  ? ?MCHC  ?Date Value Ref Range Status  ?07/13/2021 32.0 30.0 - 36.0 g/dL Final  ? ?MCH  ?Date Value Ref Range Status  ?07/13/2021 26.3 26.0 - 34.0 pg Final  ? ?MCV  ?Date Value Ref Range Status  ?07/13/2021 82.2 80.0 - 100.0 fL Final  ?03/15/2017 89 79 - 97 fL  Final  ?02/03/2013 89 80 - 100 fL Final  ? ?No results found for: PLTCOUNTKUC, LABPLAT, [place] ?RDW  ?Date Value Ref Range Status  ?07/13/2021 15.9 (H) 11.5 - 15.5 % Final  ?03/15/2017 13.5 12.3 - 15.4 % Final  ?02/03/2013 13.8 11.5 - 14.5 % Final  ? ?  ?  ?  ? ?

## 2021-09-28 ENCOUNTER — Ambulatory Visit (INDEPENDENT_AMBULATORY_CARE_PROVIDER_SITE_OTHER): Payer: Self-pay | Admitting: Family Medicine

## 2021-09-28 DIAGNOSIS — C642 Malignant neoplasm of left kidney, except renal pelvis: Secondary | ICD-10-CM

## 2021-09-29 NOTE — Progress Notes (Signed)
This was a home visit for a hospice patient dying of urothelial carcinoma. ?We had an hour and 20-minute discussion regarding him, his life.  I made sure that he knows that we will make him comfortable and is last days.  He is starting to fall some at home now with weakness.  Talk with the patient and his wife about trying to prevent the falls. ?

## 2021-12-08 DEATH — deceased
